# Patient Record
Sex: Female | Born: 1965
Health system: Southern US, Community
[De-identification: ages and names within clinical notes are randomized; demographics above are authoritative.]

## PROBLEM LIST (undated history)

## (undated) ENCOUNTER — Emergency Department (HOSPITAL_COMMUNITY): Admission: EM | Discharge status: Absent without leave | Payer: Self-pay

## (undated) DIAGNOSIS — F418 Other specified anxiety disorders: Secondary | ICD-10-CM

## (undated) DIAGNOSIS — R112 Nausea with vomiting, unspecified: Secondary | ICD-10-CM

## (undated) DIAGNOSIS — T63001A Toxic effect of unspecified snake venom, accidental (unintentional), initial encounter: Secondary | ICD-10-CM

## (undated) DIAGNOSIS — K279 Peptic ulcer, site unspecified, unspecified as acute or chronic, without hemorrhage or perforation: Secondary | ICD-10-CM

## (undated) DIAGNOSIS — M1990 Unspecified osteoarthritis, unspecified site: Secondary | ICD-10-CM

## (undated) DIAGNOSIS — Z9889 Other specified postprocedural states: Secondary | ICD-10-CM

## (undated) DIAGNOSIS — Z9289 Personal history of other medical treatment: Secondary | ICD-10-CM

## (undated) DIAGNOSIS — M6281 Muscle weakness (generalized): Secondary | ICD-10-CM

## (undated) DIAGNOSIS — B958 Unspecified staphylococcus as the cause of diseases classified elsewhere: Secondary | ICD-10-CM

## (undated) DIAGNOSIS — E079 Disorder of thyroid, unspecified: Secondary | ICD-10-CM

## (undated) DIAGNOSIS — M869 Osteomyelitis, unspecified: Secondary | ICD-10-CM

## (undated) DIAGNOSIS — W5501XA Bitten by cat, initial encounter: Secondary | ICD-10-CM

## (undated) DIAGNOSIS — L109 Pemphigus, unspecified: Secondary | ICD-10-CM

## (undated) DIAGNOSIS — K449 Diaphragmatic hernia without obstruction or gangrene: Secondary | ICD-10-CM

## (undated) HISTORY — PX: APPENDECTOMY: SHX54

## (undated) HISTORY — PX: TONSILLECTOMY: SUR1361

## (undated) HISTORY — PX: ELBOW SURGERY: SHX618

## (undated) HISTORY — DX: Unspecified osteoarthritis, unspecified site: M19.90

---
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
## 1988-09-12 HISTORY — PX: BREAST SURGERY: SHX581

## 1996-09-12 HISTORY — PX: LAPAROSCOPIC CHOLECYSTECTOMY: SUR755

## 2001-09-12 HISTORY — PX: TOTAL ABDOMINAL HYSTERECTOMY: SHX209

## 2013-11-09 ENCOUNTER — Emergency Department (HOSPITAL_BASED_OUTPATIENT_CLINIC_OR_DEPARTMENT_OTHER)
Admission: EM | Admit: 2013-11-09 | Discharge: 2013-11-09 | Disposition: A | Discharge status: Home / Self Care | Payer: BC Managed Care – PPO | Attending: Emergency Medicine | Admitting: Emergency Medicine

## 2013-11-09 ENCOUNTER — Encounter (HOSPITAL_BASED_OUTPATIENT_CLINIC_OR_DEPARTMENT_OTHER): Payer: Self-pay | Admitting: Emergency Medicine

## 2013-11-09 DIAGNOSIS — F419 Anxiety disorder, unspecified: Secondary | ICD-10-CM

## 2013-11-09 DIAGNOSIS — L109 Pemphigus, unspecified: Secondary | ICD-10-CM

## 2013-11-09 DIAGNOSIS — F430 Acute stress reaction: Secondary | ICD-10-CM | POA: Insufficient documentation

## 2013-11-09 DIAGNOSIS — Z87828 Personal history of other (healed) physical injury and trauma: Secondary | ICD-10-CM | POA: Insufficient documentation

## 2013-11-09 DIAGNOSIS — IMO0002 Reserved for concepts with insufficient information to code with codable children: Secondary | ICD-10-CM | POA: Insufficient documentation

## 2013-11-09 DIAGNOSIS — L129 Pemphigoid, unspecified: Secondary | ICD-10-CM | POA: Insufficient documentation

## 2013-11-09 DIAGNOSIS — F410 Panic disorder [episodic paroxysmal anxiety] without agoraphobia: Secondary | ICD-10-CM | POA: Insufficient documentation

## 2013-11-09 DIAGNOSIS — R450 Nervousness: Secondary | ICD-10-CM | POA: Insufficient documentation

## 2013-11-09 HISTORY — DX: Pemphigus, unspecified: L10.9

## 2013-11-09 MED ORDER — HYDROCODONE-ACETAMINOPHEN 5-325 MG PO TABS: 2.0000 | ORAL_TABLET | ORAL | Status: DC | PRN

## 2013-11-09 MED ORDER — ALPRAZOLAM 0.5 MG PO TABS: 0.5000 mg | ORAL_TABLET | Freq: Every evening | ORAL | Status: DC | PRN

## 2013-11-09 MED ORDER — PREDNISONE 20 MG PO TABS: ORAL_TABLET | ORAL | Status: DC

## 2013-11-09 NOTE — Discharge Instructions (Signed)
Panic Attacks Panic attacks are sudden, short feelings of great fear or discomfort. You may have them for no reason when you are relaxed, when you are uneasy (anxious), or when you are sleeping.  HOME CARE  Take all your medicines as told.  Check with your doctor before starting new medicines.  Keep all doctor visits. GET HELP IF:  You are not able to take your medicines as told.  Your symptoms do not get better.  Your symptoms get worse. GET HELP RIGHT AWAY IF:  Your attacks seem different than your normal attacks.  You have thoughts about hurting yourself or others.  You take panic attack medicine and you have a side effect. MAKE SURE YOU:  Understand these instructions.  Will watch your condition.  Will get help right away if you are not doing well or get worse. Document Released: 10/01/2010 Document Revised: 06/19/2013 Document Reviewed: 04/12/2013 [facility] Patient Information 2014 [place], [place].  Rash A rash is a change in the color or texture of your skin. There are many different types of rashes. You may have other problems that accompany your rash. CAUSES   Infections.  Allergic reactions. This can include allergies to pets or foods.  Certain medicines.  Exposure to certain chemicals, soaps, or cosmetics.  Heat.  Exposure to poisonous plants.  Tumors, both cancerous and noncancerous. SYMPTOMS   Redness.  Scaly skin.  Itchy skin.  Dry or cracked skin.  Bumps.  Blisters.  Pain. DIAGNOSIS  Your caregiver may do a physical exam to determine what type of rash you have. A skin sample (biopsy) may be taken and examined under a microscope. TREATMENT  Treatment depends on the type of rash you have. Your caregiver may prescribe certain medicines. For serious conditions, you may need to see a skin doctor (dermatologist). HOME CARE INSTRUCTIONS   Avoid the substance that caused your rash.  Do not scratch your rash. This can cause  infection.  You may take cool baths to help stop itching.  Only take over-the-counter or prescription medicines as directed by your caregiver.  Keep all follow-up appointments as directed by your caregiver. SEEK IMMEDIATE MEDICAL CARE IF:  You have increasing pain, swelling, or redness.  You have a fever.  You have new or severe symptoms.  You have body aches, diarrhea, or vomiting.  Your rash is not better after 3 days. MAKE SURE YOU:  Understand these instructions.  Will watch your condition.  Will get help right away if you are not doing well or get worse. Document Released: 08/19/2002 Document Revised: 11/21/2011 Document Reviewed: 06/13/2011 [facility] Patient Information 2014 [place]ExitCare, [place]LLC.

## 2013-11-09 NOTE — ED Provider Notes (Signed)
CSN: [id number]     Arrival date & time 11/09/13  1228 History   First MD Initiated Contact with Patient 11/09/13 1234     Chief Complaint  Patient presents with  . Rash     (Consider location/radiation/quality/duration/timing/severity/associated sxs/prior Treatment) HPI Comments: The patient presents to the ER for evaluation of painful lesions. 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 This is a chronic condition, but she reports that it worsens when she is under stress. She reports that her mother is very sick and she cannot get back home to see her. She has been having increased stress causing her to be very anxious. She has been having occasional panic attacks where she gets extremely anxious, breathing fast with shortness of breath. Patient reports that she is now experiencing some lesions on her torso and head, which is unusual. Generally her lesions are only on her legs. There are no draining lesions, red, swollen lesions or any fever.  Patient is a 48 y.o. female presenting with rash.  Rash Associated symptoms: no fever     Past Medical History  Diagnosis Date  . Bullous pemphigus    Past Surgical History  Procedure Laterality Date  . Abdominal hysterectomy    . Appendectomy     No family history on file. History  Substance Use Topics  . Smoking status: Never Smoker   . Smokeless tobacco: Not on file  . Alcohol Use: No   OB History   Grav Para Term Preterm Abortions TAB SAB Ect Mult Living                 Review of Systems  Constitutional: Negative for fever.  Skin: Positive for rash.  Psychiatric/Behavioral: The patient is nervous/anxious.   All other systems reviewed and are negative.      Allergies  Morphine and related  Home Medications   Current Outpatient Rx  Name  Route  Sig  Dispense  Refill  . ALPRAZolam (XANAX) 0.5 MG tablet   Oral   Take 1 tablet (0.5 mg total) by mouth at bedtime as needed for  anxiety.   15 tablet   0   . HYDROcodone-acetaminophen (NORCO/VICODIN) 5-325 MG per tablet   Oral   Take 2 tablets by mouth every 4 (four) hours as needed for moderate pain.   20 tablet   0   . predniSONE (DELTASONE) 20 MG tablet      3 tabs po daily x 3 days, then 2 tabs x 3 days, then 1.5 tabs x 3 days, then 1 tab x 3 days, then 0.5 tabs x 3 days   27 tablet   0    BP 113/94  Pulse 110  Temp(Src) 98.6 F (37 C) (Oral)  Resp 18  SpO2 96% Physical Exam  Constitutional: She is oriented to person, place, and time. She appears well-developed and well-nourished. No distress.  HENT:  Head: Normocephalic and atraumatic.  Right Ear: Hearing normal.  Left Ear: Hearing normal.  Nose: Nose normal.  Mouth/Throat: Oropharynx is clear and moist and mucous membranes are normal.  Eyes: Conjunctivae and EOM are normal. Pupils are equal, round, and reactive to light.  Neck: Normal range of motion. Neck supple.  Cardiovascular: Regular rhythm, S1 normal and S2 normal.  Exam reveals no gallop and no friction rub.   No murmur heard. Pulmonary/Chest: Effort normal and breath sounds normal. No respiratory distress. She exhibits no tenderness.  Abdominal: Soft. Normal appearance and bowel  sounds are normal. There is no hepatosplenomegaly. There is no tenderness. There is no rebound, no guarding, no tenderness at McBurney's point and negative Murphy's sign. No hernia.  Musculoskeletal: Normal range of motion.  Neurological: She is alert and oriented to person, place, and time. She has normal strength. No cranial nerve deficit or sensory deficit. Coordination normal. GCS eye subscore is 4. GCS verbal subscore is 5. GCS motor subscore is 6.  Skin: Skin is warm, dry and intact. Rash noted. No cyanosis.  Multiple circular lesions on the lower legs with one on the lower abdomen and one on the back. Some are healing, scabbed over, some are fluid-filled blisters. Most are 1-2 cm in diameter  Psychiatric:  She has a normal mood and affect. Her speech is normal and behavior is normal. Thought content normal.    ED Course  Procedures (including critical care time) Labs Review Labs Reviewed - No data to display Imaging Review No results found.   EKG Interpretation None      MDM   Final diagnoses:  Bullous pemphigus  Anxiety    Patient with history of bullous pemphigus, having increased symptoms due to stress and anxiety. Patient's rash is identical to previous rashes. No signs of infection. Patient given rescue Xanax and also will be placed on a prednisone taper.    Gilda Creasehristopher J. Pollina, MD 11/11/13 [phone]

## 2013-11-09 NOTE — ED Notes (Signed)
Patient states that she was bitten by a brown recluse spider several years ago, as a result has a permanent condition that cause her to get blisters or bumps typically on her legs. She states that it can be stress induced and she is under a lot of stress recently with her family. But she is concerned because the bumps are spreading to other parts of her body, which is unusual.

## 2013-11-30 ENCOUNTER — Emergency Department (HOSPITAL_BASED_OUTPATIENT_CLINIC_OR_DEPARTMENT_OTHER): Payer: BC Managed Care – PPO

## 2013-11-30 ENCOUNTER — Emergency Department (HOSPITAL_BASED_OUTPATIENT_CLINIC_OR_DEPARTMENT_OTHER)
Admission: EM | Admit: 2013-11-30 | Discharge: 2013-11-30 | Disposition: A | Discharge status: Home / Self Care | Payer: BC Managed Care – PPO | Attending: Emergency Medicine | Admitting: Emergency Medicine

## 2013-11-30 ENCOUNTER — Encounter (HOSPITAL_BASED_OUTPATIENT_CLINIC_OR_DEPARTMENT_OTHER): Payer: Self-pay | Admitting: Emergency Medicine

## 2013-11-30 DIAGNOSIS — L120 Bullous pemphigoid: Secondary | ICD-10-CM

## 2013-11-30 DIAGNOSIS — L129 Pemphigoid, unspecified: Secondary | ICD-10-CM | POA: Insufficient documentation

## 2013-11-30 DIAGNOSIS — F411 Generalized anxiety disorder: Secondary | ICD-10-CM | POA: Insufficient documentation

## 2013-11-30 DIAGNOSIS — Z79899 Other long term (current) drug therapy: Secondary | ICD-10-CM | POA: Insufficient documentation

## 2013-11-30 DIAGNOSIS — R112 Nausea with vomiting, unspecified: Secondary | ICD-10-CM | POA: Insufficient documentation

## 2013-11-30 DIAGNOSIS — R0789 Other chest pain: Secondary | ICD-10-CM | POA: Insufficient documentation

## 2013-11-30 MED ORDER — CLOBETASOL PROPIONATE 0.05 % EX CREA: 1.0000 "application " | TOPICAL_CREAM | Freq: Two times a day (BID) | CUTANEOUS | Status: DC

## 2013-11-30 MED ORDER — OXYCODONE-ACETAMINOPHEN 5-325 MG PO TABS: 2.0000 | ORAL_TABLET | ORAL | Status: DC | PRN

## 2013-11-30 NOTE — ED Notes (Signed)
Pt reports chest tightness n/v SOB assocated with shingles out break

## 2013-11-30 NOTE — ED Provider Notes (Signed)
CSN: [id number]     Arrival date & time 11/30/13  1926 History  This chart was scribed for Hilario Quarryanielle S Nelli Swalley, MD by Blanchard KelchNicole Curnes, ED Scribe. The patient was seen in room MH01/MH01. Patient's care was started at 8:40 PM.    Chief Complaint  Patient presents with  . Chest Pain    chest tigntness denies pain  . Herpes Zoster      Patient is a 48 y.o. female presenting with rash. The history is provided by the patient. No language interpreter was used.  Rash Quality: blistering and redness   Onset quality:  Gradual Duration:  1 month Timing:  Constant Progression:  Worsening Chronicity:  Chronic Relieved by: Prednisone. Associated symptoms: nausea and vomiting     HPI Comments: [name] Ledet is a 48 y.o. female with a [facility] who presents to the Emergency Department complaining of constant, full body painful lesions throughout her body that first started appearing about a month ago. She states that the lesions appear and then slowly grow into sores. She reports recent anxiety, which could have triggered the new outbreak of lesions. She also reports chest tightness, nausea, vomiting and shortness of breath that she attributes to anxiety due to the pain of the lesions. She states that she has vomited in the past with outbreaks due to the pain and anxiety.She states that Prednisone tends to alleviate them. She denies any other pertinent past medical history. She is allergic to morphine. She denies smoking or alcohol use.     Past Medical History  Diagnosis Date  . Bullous pemphigus    Past Surgical History  Procedure Laterality Date  . Abdominal hysterectomy    . Appendectomy     History reviewed. No pertinent family history. History  Substance Use Topics  . Smoking status: Never Smoker   . Smokeless tobacco: Not on file  . Alcohol Use: No   OB History   Grav Para Term Preterm Abortions TAB SAB Ect Mult Living                  Review of Systems  Respiratory: Positive for chest tightness.   Gastrointestinal: Positive for nausea and vomiting.  Skin: Positive for rash and wound.  Psychiatric/Behavioral: The patient is nervous/anxious.   All other systems reviewed and are negative.      Allergies  Morphine and related  Home Medications   Current Outpatient Rx  Name  Route  Sig  Dispense  Refill  . ALPRAZolam (XANAX) 0.5 MG tablet   Oral   Take 1 tablet (0.5 mg total) by mouth at bedtime as needed for anxiety.   15 tablet   0   . HYDROcodone-acetaminophen (NORCO/VICODIN) 5-325 MG per tablet   Oral   Take 2 tablets by mouth every 4 (four) hours as needed for moderate pain.   20 tablet   0   . predniSONE (DELTASONE) 20 MG tablet      3 tabs po daily x 3 days, then 2 tabs x 3 days, then 1.5 tabs x 3 days, then 1 tab x 3 days, then 0.5 tabs x 3 days   27 tablet   0    Triage Vitals: BP 101/74  Pulse 103  Temp(Src) 98.3 F (36.8 C) (Oral)  Resp 18  Wt 165 lb (74.844 kg)  SpO2 100%  Physical Exam  Nursing note and vitals reviewed. Constitutional: She is oriented to person, place, and time. She appears well-developed and  well-nourished.  HENT:  Head: Normocephalic and atraumatic.  Eyes: Conjunctivae and EOM are normal. Pupils are equal, round, and reactive to light.  Neck: Normal range of motion. Neck supple.  Cardiovascular: Normal rate, regular rhythm and normal heart sounds.   HR 90 on exam.  Pulmonary/Chest: Effort normal and breath sounds normal. No respiratory distress. She has no wheezes. She has no rales. She exhibits no tenderness.  Abdominal: Soft. Bowel sounds are normal. There is no tenderness. There is no rebound and no guarding.  Musculoskeletal: Normal range of motion. She exhibits no edema.  Lymphadenopathy:    She has no cervical adenopathy.  Neurological: She is alert and oriented to person, place, and time.  Skin: Skin is warm and dry. Rash noted.  Blistering lesions  present on bliteral flank anywhere from 1.5-2 cm with clear serous fluids, various larger lesions appearing as a macular papular area on left leg, left upper extremity and hand and bilateral flank and back and one on right temple.   Psychiatric: She has a normal mood and affect.    ED Course  Procedures (including critical care time)  DIAGNOSTIC STUDIES: Oxygen Saturation is 100% on room air, normal by my interpretation.    COORDINATION OF CARE: 8:41 PM -Will research most recent guidelines for treatment plan and give patient referral to establish primary care in [place]. Ordered chest x-Aroura Vasudevan.  Patient verbalizes understanding and agrees with treatment plan.    Labs Review Labs Reviewed - No data to display Imaging Review Dg Chest 2 View  11/30/2013   CLINICAL DATA:  Cutaneous lesions.  EXAM: CHEST  2 VIEW  COMPARISON:  DG SHOULDER 2+V*R* dated 11/03/2013; CT CHEST-ABD-PELV W/ CM dated 11/03/2013  FINDINGS: Normal mediastinum and cardiac silhouette. Normal pulmonary vasculature. No evidence of effusion, infiltrate, or pneumothorax. No acute bony abnormality.  IMPRESSION: No acute cardiopulmonary process.   Electronically Signed   By: Genevive Bi M.D.   On: 11/30/2013 21:23     EKG Interpretation None      MDM   Final diagnoses:  None   48 y.o. Female new to this area with history of bullous pemphigoid diagnosed several years ago who has had new outbreak during past month.  She has some associated dyspnea likely secondary to pain and anxiety.  She is not currently taking any medication for the bp and has no local pmd.  utd reccomends clobetasol and skin care as first line therapy.  She is advised regarding both and given prescription for clobetasol.  Referral is made for primary care and she is advised to obtain close follow up.  Return precautions are discussed and she voices understanding.    I personally performed the services described in this documentation, which was scribed in my  presence. The recorded information has been reviewed and considered.   Hilario Quarry, MD 11/30/13 2215

## 2013-11-30 NOTE — Discharge Instructions (Signed)
Bullous Pemphigoid °Bullous pemphigoid is a rare disorder of the skin that causes large blisters. Blisters are thin sacs that contain clear fluid. This disorder is mostly seen in people over age 46. It can flare up for weeks or months. These flare-ups usually go away suddenly, and it may be months or years before another flare-up develops. The blisters appear most often in the groin, armpits, trunk, thighs, and forearms. These blisters can cause great difficulty with your daily activities because of severe itching and irritation. In most cases, this condition disappears completely within 6 years. However, a small number of people may still have flare-ups after completing treatment. °CAUSES  °Bullous pemphigoid occurs when the body's own immune system attacks the layer of tissue beneath your skin. It is unknown why this attack occurs. In some cases, medicines can trigger blisters to form. °SYMPTOMS  °The severity of flare-ups varies from person to person. In mild cases, there may be a few small blisters with slight redness or irritation. In severe cases, blisters are larger and there are many of them located in more areas of the body. Itching, redness, and irritation may be severe. The blisters may even break open to form sores (ulcers). Mouth sores and bleeding gums can develop. This makes eating difficult. A recurrent cough or pain with swallowing can also develop if the deeper part of the throat is affected. There may also be nosebleeds if the inner part of the nose is affected. °DIAGNOSIS  °Your caregiver will do a physical exam. He or she may also take a tissue sample (biopsy) from the skin. This sample is examined in a lab to look for abnormal antibodies. Blood tests may also be done. °TREATMENT  °Treatment is aimed at relieving your symptoms and preventing infection. Medicines prescribed by your caregiver may include: °· Corticosteroids. These may be given as a pill or cream applied to the  skin. °· Antibiotics. °· Vitamin B complex. °· Medicines that suppress your immune system (immunosuppressive drugs). °Severe symptoms or complications from an infection may require hospitalization. This may be necessary for:  °· Management of wounds and ulcers. °· Giving medicines through an intravenous line (IV). °· Feeding if severe blisters or ulcers are affecting the mouth. °HOME CARE INSTRUCTIONS  °· Only take medicines as directed by your caregiver. °· If your mouth or lips are affected, your diet should only include soft foods and liquids. °· If your mouth or lips are affected, avoid drinking very hot liquids. °· Do not break or drain your blisters. °· Follow your caregiver's instructions for wearing bandages (dressings) over your blisters or ulcers. °SEEK MEDICAL CARE IF:  °· Your itching or pain is not helped by medicine. °· You develop unexplained blisters on your skin. °· You develop redness, swelling, or pain that extends beyond your blisters or ulcers. °· You notice yellowish-white fluid (pus) coming from your wounds. °SEEK IMMEDIATE MEDICAL CARE IF:  °· Your pain becomes severe. °· You cannot eat or drink because of blisters, ulcers, or pain in your lips or mouth. °· You cannot care for yourself because of blisters, ulcers, or pain in your hands or the soles of your feet. °· You have a fever. °MAKE SURE YOU:  °· Understand these instructions. °· Will watch your condition. °· Will get help right away if you are not doing well or get worse. °Document Released: 06/26/2007 Document Revised: 11/21/2011 Document Reviewed: 08/09/2011 °ExitCare® Patient Information ©2014 [employer]. ° °

## 2013-12-16 ENCOUNTER — Ambulatory Visit: Payer: BC Managed Care – PPO | Admitting: Physician Assistant

## 2013-12-16 DIAGNOSIS — Z0289 Encounter for other administrative examinations: Secondary | ICD-10-CM

## 2014-01-06 ENCOUNTER — Emergency Department (HOSPITAL_BASED_OUTPATIENT_CLINIC_OR_DEPARTMENT_OTHER)
Admission: EM | Admit: 2014-01-06 | Discharge: 2014-01-06 | Disposition: A | Discharge status: Home / Self Care | Payer: BC Managed Care – PPO | Attending: Emergency Medicine | Admitting: Emergency Medicine

## 2014-01-06 ENCOUNTER — Encounter (HOSPITAL_BASED_OUTPATIENT_CLINIC_OR_DEPARTMENT_OTHER): Payer: Self-pay | Admitting: Emergency Medicine

## 2014-01-06 DIAGNOSIS — IMO0002 Reserved for concepts with insufficient information to code with codable children: Secondary | ICD-10-CM | POA: Insufficient documentation

## 2014-01-06 DIAGNOSIS — L129 Pemphigoid, unspecified: Secondary | ICD-10-CM | POA: Insufficient documentation

## 2014-01-06 DIAGNOSIS — L120 Bullous pemphigoid: Secondary | ICD-10-CM

## 2014-01-06 DIAGNOSIS — Z79899 Other long term (current) drug therapy: Secondary | ICD-10-CM | POA: Insufficient documentation

## 2014-01-06 MED ORDER — HYDROCODONE-ACETAMINOPHEN 5-325 MG PO TABS: 2.0000 | ORAL_TABLET | ORAL | Status: DC | PRN

## 2014-01-06 MED ORDER — PREDNISONE 10 MG PO TABS: ORAL_TABLET | ORAL | Status: DC

## 2014-01-06 NOTE — ED Provider Notes (Signed)
Medical screening examination/treatment/procedure(s) were performed by non-physician practitioner and as supervising physician I was immediately available for consultation/collaboration.   EKG Interpretation None        Tichina Koebel B. Bernette MayersSheldon, MD 01/06/14 1059

## 2014-01-06 NOTE — ED Provider Notes (Signed)
CSN: [id number]     Arrival date & time 01/06/14  [id number] History   First MD Initiated Contact with Patient 01/06/14 [phone]     Chief Complaint  Patient presents with  . Leg Pain     (Consider location/radiation/quality/duration/timing/severity/associated sxs/prior Treatment) Patient is a 48 y.o. female presenting with leg pain. The history is provided by the patient.  Leg Pain Location:  Leg Injury: no   Leg location:  L leg Pain details:    Quality:  Aching   Radiates to:  L leg   Severity:  No pain   Onset quality:  Gradual   Timing:  Constant Chronicity:  New Dislocation: no   Relieved by:  Nothing Ineffective treatments:  None tried Pt complains of an outbreak of bullous pemphigus.   (pt unable to see her MD for 2 week)  Pt usually takes a steroid and pain medication during exacerbations)  Pt has had for years  Past Medical History  Diagnosis Date  . Bullous pemphigus    Past Surgical History  Procedure Laterality Date  . Abdominal hysterectomy    . Appendectomy     No family history on file. History  Substance Use Topics  . Smoking status: Never Smoker   . Smokeless tobacco: Not on file  . Alcohol Use: No   OB History   Grav Para Term Preterm Abortions TAB SAB Ect Mult Living                 Review of Systems  Skin: Positive for wound.  All other systems reviewed and are negative.     Allergies  Morphine and related  Home Medications   Prior to Admission medications   Medication Sig Start Date End Date Taking? Authorizing Provider  escitalopram (LEXAPRO) 20 MG tablet Take 20 mg by mouth daily.   Yes Historical Provider, MD  gabapentin (NEURONTIN) 300 MG capsule Take 300 mg by mouth 3 (three) times daily.   Yes Historical Provider, MD  ALPRAZolam Prudy Feeler(XANAX) 0.5 MG tablet Take 1 tablet (0.5 mg total) by mouth at bedtime as needed for anxiety. 11/09/13   Gilda Creasehristopher J. Pollina, MD  clobetasol cream (TEMOVATE) 0.05 % Apply 1 application topically 2 (two)  times daily. 11/30/13   Hilario Quarryanielle S Ray, MD  HYDROcodone-acetaminophen (NORCO/VICODIN) 5-325 MG per tablet Take 2 tablets by mouth every 4 (four) hours as needed for moderate pain. 11/09/13   Gilda Creasehristopher J. Pollina, MD  oxyCODONE-acetaminophen (PERCOCET/ROXICET) 5-325 MG per tablet Take 2 tablets by mouth every 4 (four) hours as needed for severe pain. 11/30/13   Hilario Quarryanielle S Ray, MD  predniSONE (DELTASONE) 20 MG tablet 3 tabs po daily x 3 days, then 2 tabs x 3 days, then 1.5 tabs x 3 days, then 1 tab x 3 days, then 0.5 tabs x 3 days 11/09/13   Gilda Creasehristopher J. Pollina, MD   BP 128/78  Temp(Src) 99.9 F (37.7 C)  Resp 16  Ht 5\' 4"  (1.626 m)  Wt 168 lb (76.204 kg)  BMI 28.82 kg/m2  SpO2 100% Physical Exam  Nursing note and vitals reviewed. Constitutional: She is oriented to person, place, and time. She appears well-developed and well-nourished.  HENT:  Head: Normocephalic.  Eyes: EOM are normal.  Neck: Normal range of motion.  Cardiovascular: Normal rate.   Pulmonary/Chest: Effort normal.  Musculoskeletal: Normal range of motion.  Neurological: She is alert and oriented to person, place, and time.  Skin: There is erythema.  Multiple large sores left lower leg  Psychiatric: She has a normal mood and affect.    ED Course  Procedures (including critical care time) Labs Review Labs Reviewed - No data to display  Imaging Review No results found.   EKG Interpretation None      MDM   Final diagnoses:  Bullous pemphigoid    Pt advised to follow up with her MD as scheduled Rx for prednisone taper Hydrocodone    Elson AreasLeslie K Ellowyn Rieves, PA-C 01/06/14 1025

## 2014-01-06 NOTE — Discharge Instructions (Signed)
Bullous Pemphigoid Bullous pemphigoid is a rare disorder of the skin that causes large blisters. Blisters are thin sacs that contain clear fluid. This disorder is mostly seen in people over age 46. It can flare up for weeks or months. These flare-ups usually go away suddenly, and it may be months or years before another flare-up develops. The blisters appear most often in the groin, armpits, trunk, thighs, and forearms. These blisters can cause great difficulty with your daily activities because of severe itching and irritation. In most cases, this condition disappears completely within 6 years. However, a small number of people may still have flare-ups after completing treatment. CAUSES  Bullous pemphigoid occurs when the body's own immune system attacks the layer of tissue beneath your skin. It is unknown why this attack occurs. In some cases, medicines can trigger blisters to form. SYMPTOMS  The severity of flare-ups varies from person to person. In mild cases, there may be a few small blisters with slight redness or irritation. In severe cases, blisters are larger and there are many of them located in more areas of the body. Itching, redness, and irritation may be severe. The blisters may even break open to form sores (ulcers). Mouth sores and bleeding gums can develop. This makes eating difficult. A recurrent cough or pain with swallowing can also develop if the deeper part of the throat is affected. There may also be nosebleeds if the inner part of the nose is affected. DIAGNOSIS  Your caregiver will do a physical exam. He or she may also take a tissue sample (biopsy) from the skin. This sample is examined in a lab to look for abnormal antibodies. Blood tests may also be done. TREATMENT  Treatment is aimed at relieving your symptoms and preventing infection. Medicines prescribed by your caregiver may include:  Corticosteroids. These may be given as a pill or cream applied to the  skin.  Antibiotics.  Vitamin B complex.  Medicines that suppress your immune system (immunosuppressive drugs). Severe symptoms or complications from an infection may require hospitalization. This may be necessary for:   Management of wounds and ulcers.  Giving medicines through an intravenous line (IV).  Feeding if severe blisters or ulcers are affecting the mouth. HOME CARE INSTRUCTIONS   Only take medicines as directed by your caregiver.  If your mouth or lips are affected, your diet should only include soft foods and liquids.  If your mouth or lips are affected, avoid drinking very hot liquids.  Do not break or drain your blisters.  Follow your caregiver's instructions for wearing bandages (dressings) over your blisters or ulcers. SEEK MEDICAL CARE IF:   Your itching or pain is not helped by medicine.  You develop unexplained blisters on your skin.  You develop redness, swelling, or pain that extends beyond your blisters or ulcers.  You notice yellowish-white fluid (pus) coming from your wounds. SEEK IMMEDIATE MEDICAL CARE IF:   Your pain becomes severe.  You cannot eat or drink because of blisters, ulcers, or pain in your lips or mouth.  You cannot care for yourself because of blisters, ulcers, or pain in your hands or the soles of your feet.  You have a fever. MAKE SURE YOU:   Understand these instructions.  Will watch your condition.  Will get help right away if you are not doing well or get worse. Document Released: 06/26/2007 Document Revised: 11/21/2011 Document Reviewed: 08/09/2011 [facility] Patient Information 2014 [place], [place].

## 2014-01-06 NOTE — ED Notes (Signed)
Reports leg pain that started Friday.  She has a hx of same and states she gets "flare up with stress".

## 2014-01-06 NOTE — ED Notes (Signed)
Karen Sofia, PA-C at bedside 

## 2014-01-22 ENCOUNTER — Encounter (HOSPITAL_BASED_OUTPATIENT_CLINIC_OR_DEPARTMENT_OTHER): Payer: Self-pay | Admitting: Emergency Medicine

## 2014-01-22 ENCOUNTER — Emergency Department (HOSPITAL_BASED_OUTPATIENT_CLINIC_OR_DEPARTMENT_OTHER)
Admission: EM | Admit: 2014-01-22 | Discharge: 2014-01-22 | Disposition: A | Discharge status: Home / Self Care | Payer: BC Managed Care – PPO | Attending: Emergency Medicine | Admitting: Emergency Medicine

## 2014-01-22 DIAGNOSIS — L129 Pemphigoid, unspecified: Secondary | ICD-10-CM | POA: Diagnosis not present

## 2014-01-22 DIAGNOSIS — L120 Bullous pemphigoid: Secondary | ICD-10-CM

## 2014-01-22 DIAGNOSIS — IMO0002 Reserved for concepts with insufficient information to code with codable children: Secondary | ICD-10-CM | POA: Insufficient documentation

## 2014-01-22 DIAGNOSIS — R21 Rash and other nonspecific skin eruption: Secondary | ICD-10-CM | POA: Diagnosis present

## 2014-01-22 DIAGNOSIS — Z79899 Other long term (current) drug therapy: Secondary | ICD-10-CM | POA: Diagnosis not present

## 2014-01-22 DIAGNOSIS — F411 Generalized anxiety disorder: Secondary | ICD-10-CM | POA: Diagnosis not present

## 2014-01-22 MED ORDER — OXYCODONE-ACETAMINOPHEN 5-325 MG PO TABS: 2.0000 | ORAL_TABLET | ORAL | Status: DC | PRN

## 2014-01-22 MED ORDER — LORAZEPAM 1 MG PO TABS: 1.0000 mg | ORAL_TABLET | Freq: Three times a day (TID) | ORAL | Status: DC | PRN

## 2014-01-22 NOTE — ED Provider Notes (Signed)
CSN: [id number]     Arrival date & time 01/22/14  [id number] History   First MD Initiated Contact with Patient 01/22/14 1018     Chief Complaint  Patient presents with  . Rash     (Consider location/radiation/quality/duration/timing/severity/associated sxs/prior Treatment) HPI Comments: Patient is a 48 year old female with history of bullous pemphigoid. She presents with complaints of a breakout on her back. She tells me that this is extremely painful and not relieved with hydrocodone. She tells me that she gets these breakouts when she is anxious. She has had issues with her mother's health and has been anxious about this. She believes this is worsening her breakouts.  Patient is a 48 y.o. female presenting with rash. The history is provided by the patient.  Rash Location: Back. Quality: blistering   Severity:  Moderate Onset quality:  Gradual Timing:  Constant Progression:  Worsening Chronicity:  Recurrent Relieved by:  Nothing Worsened by:  Nothing tried   Past Medical History  Diagnosis Date  . Bullous pemphigus    Past Surgical History  Procedure Laterality Date  . Abdominal hysterectomy    . Appendectomy     No family history on file. History  Substance Use Topics  . Smoking status: Never Smoker   . Smokeless tobacco: Not on file  . Alcohol Use: No   OB History   Grav Para Term Preterm Abortions TAB SAB Ect Mult Living                 Review of Systems  Skin: Positive for rash.  All other systems reviewed and are negative.     Allergies  Morphine and related  Home Medications   Prior to Admission medications   Medication Sig Start Date End Date Taking? Authorizing Provider  ALPRAZolam Prudy Feeler(XANAX) 0.5 MG tablet Take 1 tablet (0.5 mg total) by mouth at bedtime as needed for anxiety. 11/09/13   Gilda Creasehristopher J. Pollina, MD  clobetasol cream (TEMOVATE) 0.05 % Apply 1 application topically 2 (two) times daily. 11/30/13   Hilario Quarryanielle S Ray, MD  escitalopram (LEXAPRO) 20  MG tablet Take 20 mg by mouth daily.    Historical Provider, MD  gabapentin (NEURONTIN) 300 MG capsule Take 300 mg by mouth 3 (three) times daily.    Historical Provider, MD  HYDROcodone-acetaminophen (NORCO/VICODIN) 5-325 MG per tablet Take 2 tablets by mouth every 4 (four) hours as needed for moderate pain. 11/09/13   Gilda Creasehristopher J. Pollina, MD  HYDROcodone-acetaminophen (NORCO/VICODIN) 5-325 MG per tablet Take 2 tablets by mouth every 4 (four) hours as needed. 01/06/14   Elson AreasLeslie K Sofia, PA-C  oxyCODONE-acetaminophen (PERCOCET/ROXICET) 5-325 MG per tablet Take 2 tablets by mouth every 4 (four) hours as needed for severe pain. 11/30/13   Hilario Quarryanielle S Ray, MD  predniSONE (DELTASONE) 10 MG tablet 6,5,4,3,2,1 taper 01/06/14   Elson AreasLeslie K Sofia, PA-C  predniSONE (DELTASONE) 20 MG tablet 3 tabs po daily x 3 days, then 2 tabs x 3 days, then 1.5 tabs x 3 days, then 1 tab x 3 days, then 0.5 tabs x 3 days 11/09/13   Gilda Creasehristopher J. Pollina, MD   BP 124/81  Pulse 97  Temp(Src) 98 F (36.7 C) (Oral)  Resp 18  Wt 168 lb (76.204 kg)  SpO2 98% Physical Exam  Nursing note and vitals reviewed. Constitutional: She is oriented to person, place, and time. She appears well-developed and well-nourished. No distress.  HENT:  Head: Normocephalic and atraumatic.  Neck: Normal range of motion. Neck supple.  Neurological: She  is alert and oriented to person, place, and time.  Skin: She is not diaphoretic.  There are several large, bullous lesions noted to the left side of the back. There is no purulent drainage and no surrounding erythema. These are tender to the touch.    ED Course  Procedures (including critical care time) Labs Review Labs Reviewed - No data to display  Imaging Review No results found.   EKG Interpretation None      MDM   Final diagnoses:  None    Patient presents here complaining of pain coming from her skin condition. This is her fourth visit here for the same in the past 2 months. Upon  reviewing her prescription medication history on the controlled substance database, it was noted that she has now had 11 prescriptions for either hydrocodone or oxycodone along with multiple others for tramadol and anxiolytic medications. I've advised her that I will prescribe a limited quantity of medications, however she is to obtain a primary care for future refills. She understands that we will not refill these medications in the emergency department any longer.    Geoffery Lyonsouglas Meagan Spease, MD 01/22/14 [phone]

## 2014-01-22 NOTE — ED Notes (Signed)
Pt reports "blisters" and a "breakout" on back.  Reports increased anxiety and insomnia that makes lesions worse.  Vicodin is ineffective.

## 2014-01-22 NOTE — ED Notes (Signed)
Pt sts she has Bullous Pemphigoid; presently has rash to left back, following dermatone pattern, and on Left lower leg. Pt sts history of same. Needs medications for tx.

## 2014-01-22 NOTE — Discharge Instructions (Signed)
Percocet and Ativan as prescribed.  You will need to obtain a primary care provider for future refills of these medications.   Bullous Pemphigoid Bullous pemphigoid is a rare disorder of the skin that causes large blisters. Blisters are thin sacs that contain clear fluid. This disorder is mostly seen in people over age 46. It can flare up for weeks or months. These flare-ups usually go away suddenly, and it may be months or years before another flare-up develops. The blisters appear most often in the groin, armpits, trunk, thighs, and forearms. These blisters can cause great difficulty with your daily activities because of severe itching and irritation. In most cases, this condition disappears completely within 6 years. However, a small number of people may still have flare-ups after completing treatment. CAUSES  Bullous pemphigoid occurs when the body's own immune system attacks the layer of tissue beneath your skin. It is unknown why this attack occurs. In some cases, medicines can trigger blisters to form. SYMPTOMS  The severity of flare-ups varies from person to person. In mild cases, there may be a few small blisters with slight redness or irritation. In severe cases, blisters are larger and there are many of them located in more areas of the body. Itching, redness, and irritation may be severe. The blisters may even break open to form sores (ulcers). Mouth sores and bleeding gums can develop. This makes eating difficult. A recurrent cough or pain with swallowing can also develop if the deeper part of the throat is affected. There may also be nosebleeds if the inner part of the nose is affected. DIAGNOSIS  Your caregiver will do a physical exam. He or she may also take a tissue sample (biopsy) from the skin. This sample is examined in a lab to look for abnormal antibodies. Blood tests may also be done. TREATMENT  Treatment is aimed at relieving your symptoms and preventing infection. Medicines  prescribed by your caregiver may include:  Corticosteroids. These may be given as a pill or cream applied to the skin.  Antibiotics.  Vitamin B complex.  Medicines that suppress your immune system (immunosuppressive drugs). Severe symptoms or complications from an infection may require hospitalization. This may be necessary for:   Management of wounds and ulcers.  Giving medicines through an intravenous line (IV).  Feeding if severe blisters or ulcers are affecting the mouth. HOME CARE INSTRUCTIONS   Only take medicines as directed by your caregiver.  If your mouth or lips are affected, your diet should only include soft foods and liquids.  If your mouth or lips are affected, avoid drinking very hot liquids.  Do not break or drain your blisters.  Follow your caregiver's instructions for wearing bandages (dressings) over your blisters or ulcers. SEEK MEDICAL CARE IF:   Your itching or pain is not helped by medicine.  You develop unexplained blisters on your skin.  You develop redness, swelling, or pain that extends beyond your blisters or ulcers.  You notice yellowish-white fluid (pus) coming from your wounds. SEEK IMMEDIATE MEDICAL CARE IF:   Your pain becomes severe.  You cannot eat or drink because of blisters, ulcers, or pain in your lips or mouth.  You cannot care for yourself because of blisters, ulcers, or pain in your hands or the soles of your feet.  You have a fever. MAKE SURE YOU:   Understand these instructions.  Will watch your condition.  Will get help right away if you are not doing well or get worse. Document  Released: 06/26/2007 Document Revised: 11/21/2011 Document Reviewed: 08/09/2011 [facility] Patient Information 2014 [place]Care, [place]LL[place].

## 2014-06-12 DIAGNOSIS — W5501XA Bitten by cat, initial encounter: Secondary | ICD-10-CM

## 2014-06-12 HISTORY — DX: Bitten by cat, initial encounter: W55.01XA

## 2014-09-12 HISTORY — PX: SKIN GRAFT: SHX250

## 2015-03-10 DIAGNOSIS — L03119 Cellulitis of unspecified part of limb: Secondary | ICD-10-CM | POA: Insufficient documentation

## 2015-03-29 ENCOUNTER — Emergency Department (INDEPENDENT_AMBULATORY_CARE_PROVIDER_SITE_OTHER)
Admission: EM | Admit: 2015-03-29 | Discharge: 2015-03-29 | Disposition: A | Discharge status: Home / Self Care | Payer: Self-pay | Source: Home / Self Care | Attending: Emergency Medicine | Admitting: Emergency Medicine

## 2015-03-29 ENCOUNTER — Encounter (HOSPITAL_COMMUNITY): Payer: Self-pay | Admitting: *Deleted

## 2015-03-29 DIAGNOSIS — S51002A Unspecified open wound of left elbow, initial encounter: Secondary | ICD-10-CM

## 2015-03-29 HISTORY — DX: Bitten by cat, initial encounter: W55.01XA

## 2015-03-29 HISTORY — DX: Osteomyelitis, unspecified: M86.9

## 2015-03-29 HISTORY — DX: Other specified anxiety disorders: F41.8

## 2015-03-29 HISTORY — DX: Unspecified staphylococcus as the cause of diseases classified elsewhere: B95.8

## 2015-03-29 MED ORDER — ALPRAZOLAM 0.5 MG PO TABS: 0.5000 mg | ORAL_TABLET | Freq: Every evening | ORAL | Status: DC | PRN

## 2015-03-29 MED ORDER — HYDROCODONE-ACETAMINOPHEN 5-325 MG PO TABS: 1.0000 | ORAL_TABLET | ORAL | Status: DC | PRN

## 2015-03-29 MED ORDER — DOXYCYCLINE HYCLATE 100 MG PO CAPS: 100.0000 mg | ORAL_CAPSULE | Freq: Two times a day (BID) | ORAL | Status: DC

## 2015-03-29 NOTE — Discharge Instructions (Signed)
The wound doesn't look too terrible. Continue the wound care you are doing. Stop the Bactrim. Start doxycycline for 10 days. I've provided a few tablets of Xanax and Norco. Please call the wound care center to see about getting an appointment. Follow-up here if things are not improving.

## 2015-03-29 NOTE — ED Provider Notes (Signed)
CSN: [id number]     Arrival date & time 03/29/15  1604 History   First MD Initiated Contact with Patient 03/29/15 1646     Chief Complaint  Patient presents with  . Wound Infection   (Consider location/radiation/quality/duration/timing/severity/associated sxs/prior Treatment) HPI  She is a 49 year old woman here for left elbow wound. She states this has been a chronic issue since 07/03/2014. She states she received a cat bite at that time and went on to develop osteomyelitis and recurrent infections. She has had multiple surgeries as well as multiple rounds of antibiotics and skin grafts. She has just moved back to the [place] area. She saw her doctor last week for increased pain and drainage from the wound and was given Bactrim. She states the Bactrim has not helped.  She continues to see drainage. She has pain in the posterior elbow. She also reports increased anxiety since moving back to [place].  Past Medical History  Diagnosis Date  . Bullous pemphigus   . Staph infection   . Cat bite   . Osteomyelitis of elbow   . Situational anxiety    Past Surgical History  Procedure Laterality Date  . Abdominal hysterectomy    . Appendectomy    . Skin graft    . 22 surgeries on left elbow     No family history on file. History  Substance Use Topics  . Smoking status: Never Smoker   . Smokeless tobacco: Not on file  . Alcohol Use: Yes     Comment: occasional   OB History    No data available     Review of Systems As in history of present illness Allergies  Morphine and related and Zofran  Home Medications   Prior to Admission medications   Medication Sig Start Date End Date Taking? Authorizing Provider  gabapentin (NEURONTIN) 300 MG capsule Take 300 mg by mouth 3 (three) times daily.   Yes Historical Provider, MD  ibuprofen (ADVIL,MOTRIN) 800 MG tablet Take 800 mg by mouth every 8 (eight) hours as needed.   Yes Historical Provider, MD  ALPRAZolam Prudy Feeler) 0.5 MG tablet  Take 1 tablet (0.5 mg total) by mouth at bedtime as needed for anxiety. 03/29/15   Charm Rings, MD  clobetasol cream (TEMOVATE) 0.05 % Apply 1 application topically 2 (two) times daily. 11/30/13   Margarita Grizzle, MD  doxycycline (VIBRAMYCIN) 100 MG capsule Take 1 capsule (100 mg total) by mouth 2 (two) times daily. 03/29/15   Charm Rings, MD  escitalopram (LEXAPRO) 20 MG tablet Take 20 mg by mouth daily.    Historical Provider, MD  HYDROcodone-acetaminophen (NORCO/VICODIN) 5-325 MG per tablet Take 1-2 tablets by mouth every 4 (four) hours as needed for moderate pain. 03/29/15   Charm Rings, MD   BP 128/83 mmHg  Pulse 106  Temp(Src) 98.7 F (37.1 C) (Oral)  Resp 18  SpO2 98% Physical Exam  Constitutional: She is oriented to person, place, and time. She appears well-developed and well-nourished. No distress.  Cardiovascular:  Mild tachycardia  Pulmonary/Chest: Effort normal.  Neurological: She is alert and oriented to person, place, and time.  Skin:  4 x 9 cm elliptical wound over left elbow. This is mostly well-healed, except for a central 3 x 1 cm area that is a superficial ulcer. There is scant purulent drainage. There is some mild erythema.  Psychiatric:  Appears anxious    ED Course  Procedures (including critical care time) Labs Review Labs Reviewed  WOUND CULTURE  Imaging Review No results found.   MDM   1. Elbow wound, left, initial encounter    We'll change Bactrim to doxycycline. I provided 10 tablet each of Xanax and Norco. Contact information for the wound care center given. Follow-up as needed.    Charm RingsErin J Ernie Kasler, MD 03/29/15 [phone]

## 2015-03-29 NOTE — ED Notes (Signed)
Reports cat bite Oct 2015 - has had 22 surgeries (had myelitis, then osteomyelitis, skin grafts, staph infections) to left elbow.  Pt recently moved to [place].  Saw her old doctor back in other state last week when her elbow was becoming painful - was placed on Bactrim - continues taking.  States left elbow is becoming more painful, draining more.  Pain is causing her "situational anxiety" to flare-up & she is unable to sleep.  Has been out of her Xanax and pain meds - doctor last week was hesitant to prescribe since pt was crossing state lines.  Sero-sang drainage noted on dressing.

## 2015-03-31 ENCOUNTER — Emergency Department (HOSPITAL_COMMUNITY)
Admission: EM | Admit: 2015-03-31 | Discharge: 2015-03-31 | Disposition: A | Discharge status: Home / Self Care | Payer: Self-pay | Source: Home / Self Care

## 2015-03-31 ENCOUNTER — Encounter (HOSPITAL_COMMUNITY): Payer: Self-pay | Admitting: *Deleted

## 2015-03-31 DIAGNOSIS — G8929 Other chronic pain: Secondary | ICD-10-CM

## 2015-03-31 DIAGNOSIS — M25522 Pain in left elbow: Secondary | ICD-10-CM

## 2015-03-31 DIAGNOSIS — Z8739 Personal history of other diseases of the musculoskeletal system and connective tissue: Secondary | ICD-10-CM

## 2015-03-31 NOTE — Discharge Instructions (Signed)
Several possible primary care providers: Johny BlamerWilliam Harris, MD  [facility] [address] Sharlot GowdaJohn Lalonde, MD on [address] [facility] [address]

## 2015-03-31 NOTE — ED Provider Notes (Addendum)
CSN: [id number]     Arrival date & time 03/31/15  1616 History   First MD Initiated Contact with Patient 03/31/15 1708     Chief Complaint  Patient presents with  . Wound Check   (Consider location/radiation/quality/duration/timing/severity/associated sxs/prior Treatment) Patient is a 49 y.o. female presenting with wound check. The history is provided by the patient.  Wound Check This is a chronic problem. The current episode started more than 1 week ago. The problem occurs constantly. The problem has not changed since onset.Nothing aggravates the symptoms.  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  She states that this has been cared for by a Engineer, petroleum in [place] until she moved back to [place] recently.  She cannot recall the name of her prior PCP in [place] that she saw 18 months ago.  She had osteomyelitis and subsequently had a skin graft.  She says her meds two days ago haven't worked and she cannot take ibuprofen or NSAID's secondary to GI distress.  She notes that "I haven't slept in days".  Past Medical History  Diagnosis Date  . Bullous pemphigus   . Staph infection   . Cat bite   . Osteomyelitis of elbow   . Situational anxiety    Past Surgical History  Procedure Laterality Date  . Abdominal hysterectomy    . Appendectomy    . Skin graft    . 22 surgeries on left elbow     History reviewed. No pertinent family history. History  Substance Use Topics  . Smoking status: Never Smoker   . Smokeless tobacco: Not on file  . Alcohol Use: Yes     Comment: occasional   OB History    No data available     Review of Systems  Allergies  Morphine and related and Zofran  Home Medications   Prior to Admission medications   Medication Sig Start Date End Date Taking? Authorizing Provider  ALPRAZolam Prudy Feeler) 0.5 MG tablet Take 1 tablet (0.5 mg total) by mouth at bedtime as needed for  anxiety. 03/29/15   Charm Rings, MD  clobetasol cream (TEMOVATE) 0.05 % Apply 1 application topically 2 (two) times daily. 11/30/13   Margarita Grizzle, MD  doxycycline (VIBRAMYCIN) 100 MG capsule Take 1 capsule (100 mg total) by mouth 2 (two) times daily. 03/29/15   Charm Rings, MD  escitalopram (LEXAPRO) 20 MG tablet Take 20 mg by mouth daily.    Historical Provider, MD  gabapentin (NEURONTIN) 300 MG capsule Take 300 mg by mouth 3 (three) times daily.    Historical Provider, MD  HYDROcodone-acetaminophen (NORCO/VICODIN) 5-325 MG per tablet Take 1-2 tablets by mouth every 4 (four) hours as needed for moderate pain. 03/29/15   Charm Rings, MD  ibuprofen (ADVIL,MOTRIN) 800 MG tablet Take 800 mg by mouth every 8 (eight) hours as needed.    Historical Provider, MD   BP 109/82 mmHg  Pulse 86  Temp(Src) 98.6 F (37 C) (Oral)  Resp 16  SpO2 100% Physical Exam  Nursing note and vitals reviewed.  Constitutional: She is oriented to person, place, and time. She appears well-developed and well-nourished. No distress.  Cardiovascular:  Mild tachycardia  Pulmonary/Chest: Effort normal.  Neurological: She is alert and oriented to person, place, and time.  Skin:  4 x 9 cm elliptical wound over left elbow. This is mostly well-healed, except for a central 3 x 1 cm  area that is a superficial ulcer. There is scant serous drainage. There is some no erythema at wound margins.  She has reasonable ROM of the elbow. Psychiatric:  Appears anxious   ED Course  Procedures (including critical care time)   MDM  Chronic open wound left elbow with no obvious cellulitis.  This is a chronic problem with somewhat vague hx and I am not sure what she expects us to do as an urgent care center.  I will have the nurse rebandage the wound and give her some options for PCP.  Elvina SidleKurt Mariela Rex, MD   Elvina SidleKurt Natashia Roseman, MD 03/31/15 1726  Elvina SidleKurt Lesieli Bresee, MD 03/31/15 [phone]

## 2015-03-31 NOTE — ED Notes (Signed)
Pt seen [facility]  sev  Days      Ago       For  Wound  Check on a  Chronic  Infected area     -  Pt   Reports pain  And  Swelling  Noted   With drainage

## 2015-04-01 LAB — WOUND CULTURE: GRAM STAIN: NONE SEEN

## 2015-04-03 NOTE — ED Notes (Signed)
Final report of wound culture positive for staph, sensitive to Rx provided on day of visit. Called and advised patient.

## 2015-04-04 ENCOUNTER — Emergency Department (INDEPENDENT_AMBULATORY_CARE_PROVIDER_SITE_OTHER)
Admission: EM | Admit: 2015-04-04 | Discharge: 2015-04-04 | Disposition: A | Discharge status: Home / Self Care | Payer: Self-pay | Source: Home / Self Care | Attending: Family Medicine | Admitting: Family Medicine

## 2015-04-04 ENCOUNTER — Encounter (HOSPITAL_COMMUNITY): Payer: Self-pay | Admitting: Emergency Medicine

## 2015-04-04 DIAGNOSIS — T86822 Skin graft (allograft) (autograft) infection: Secondary | ICD-10-CM

## 2015-04-04 NOTE — ED Notes (Signed)
Here follow up on left elbow pain States she is in a lot of pain

## 2015-04-04 NOTE — ED Provider Notes (Signed)
CSN: [id number]     Arrival date & time 04/04/15  1749 History   First MD Initiated Contact with Patient 04/04/15 1846     Chief Complaint  Patient presents with  . Follow-up   (Consider location/radiation/quality/duration/timing/severity/associated sxs/prior Treatment) HPI  L elbow wound. Chronic. Getting worse. Constant. Still on doxy. Now with foul smell. Tefla dressing changes daily. Elbow pain is chronic. Pt given xanax and norco 7 days ago at visit to UC. 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 Unable to go back to [facility] where her previous doctors were. Patient still currently looking for employment. Denies fevers, nausea, vomiting, chest pain, shortness of breath.   Past Medical History  Diagnosis Date  . Bullous pemphigus   . Staph infection   . Cat bite   . Osteomyelitis of elbow   . Situational anxiety    Past Surgical History  Procedure Laterality Date  . Abdominal hysterectomy    . Appendectomy    . Skin graft    . 22 surgeries on left elbow     History reviewed. No pertinent family history. History  Substance Use Topics  . Smoking status: Never Smoker   . Smokeless tobacco: Not on file  . Alcohol Use: Yes     Comment: occasional   OB History    No data available     Review of Systems Per HPI with all other pertinent systems negative.   Allergies  Morphine and related and Zofran  Home Medications   Prior to Admission medications   Medication Sig Start Date End Date Taking? Authorizing Provider  ALPRAZolam Prudy Feeler) 0.5 MG tablet Take 1 tablet (0.5 mg total) by mouth at bedtime as needed for anxiety. 03/29/15   Charm Rings, MD  clobetasol cream (TEMOVATE) 0.05 % Apply 1 application topically 2 (two) times daily. 11/30/13   Margarita Grizzle, MD  doxycycline (VIBRAMYCIN) 100 MG capsule Take 1 capsule (100 mg total) by mouth 2 (two) times daily. 03/29/15   Charm Rings, MD  escitalopram (LEXAPRO) 20 MG tablet  Take 20 mg by mouth daily.    Historical Provider, MD  gabapentin (NEURONTIN) 300 MG capsule Take 300 mg by mouth 3 (three) times daily.    Historical Provider, MD  HYDROcodone-acetaminophen (NORCO/VICODIN) 5-325 MG per tablet Take 1-2 tablets by mouth every 4 (four) hours as needed for moderate pain. 03/29/15   Charm Rings, MD  ibuprofen (ADVIL,MOTRIN) 800 MG tablet Take 800 mg by mouth every 8 (eight) hours as needed.    Historical Provider, MD   BP 127/87 mmHg  Pulse 115  Temp(Src) 98.4 F (36.9 C) (Oral)  Resp 16  SpO2 100% Physical Exam Physical Exam  Constitutional: oriented to person, place, and time. appears well-developed and well-nourished. No distress.  HENT:  Head: Normocephalic and atraumatic.  Eyes: EOMI. PERRL.  Neck: Normal range of motion.  Cardiovascular: RRR, no m/r/g, 2+ distal pulses,  Pulmonary/Chest: Effort normal and breath sounds normal. No respiratory distress.  Abdominal: Soft. Bowel sounds are normal. NonTTP, no distension.  Musculoskeletal: Normal range of motion. Non ttp, no effusion.  Neurological: alert and oriented to person, place, and time.  Skin: Large left elbow wound with graft in place. Majority of graft appears to be very healthy in nature with some central ulceration. After debridement underlying tissue is beefy and red and bleeds easily. Wet-to-dry dressing applied.  Psychiatric: normal mood and affect. behavior is normal. Judgment and thought content normal.  ED Course  Procedures (including critical care time) Labs Review Labs Reviewed - No data to display  Imaging Review No results found.   MDM   1. Skin graft infection    Area of the graft that is mildly infected was cleaned with Betadine scrub brush and saline extensively. Underlying very vascular beefy granulation tissue noted. A sterile wet-to-dry dressing was applied. Patient given very strict wound care instructions to include wet-to-dry dressing changes  daily. Patient to continue her doxycycline. Patient to continue daily range of motion exercises in order to keep the wound site pliable. Patient to continue to seek medical coverage with the orange card other insurance so she can establish care with a routine physician here in the area. No evidence of septic joint or progressive infection. There are no further refills of patient's narcotics or benzodiazepines here.    Ozella Rocks, MD 04/04/15 [phone]

## 2015-04-04 NOTE — Discharge Instructions (Signed)
You are skin graft wound actually appears to be fairly healthy. This was cleaned and debrided in our office. Please continue your doxycycline. Please continue wet-to-dry dressing changes every day until the wound heals over. Please consider following up with [facility].

## 2015-04-27 ENCOUNTER — Encounter: Payer: Self-pay | Admitting: Family Medicine

## 2015-04-27 ENCOUNTER — Ambulatory Visit (INDEPENDENT_AMBULATORY_CARE_PROVIDER_SITE_OTHER): Payer: Self-pay | Admitting: Family Medicine

## 2015-04-27 VITALS — BP 114/76 | HR 106 | Temp 98.2°F | Resp 14 | Ht 64.0 in | Wt 184.0 lb

## 2015-04-27 DIAGNOSIS — Z0000 Encounter for general adult medical examination without abnormal findings: Secondary | ICD-10-CM

## 2015-04-27 LAB — CBC WITH DIFFERENTIAL/PLATELET
BASOS PCT: 0 % (ref 0–1)
Basophils Absolute: 0 10*3/uL (ref 0.0–0.1)
EOS ABS: 0.1 10*3/uL (ref 0.0–0.7)
EOS PCT: 2 % (ref 0–5)
HEMATOCRIT: 38.2 % (ref 36.0–46.0)
Hemoglobin: 13 g/dL (ref 12.0–15.0)
Lymphocytes Relative: 37 % (ref 12–46)
Lymphs Abs: 2 10*3/uL (ref 0.7–4.0)
MCH: 31 pg (ref 26.0–34.0)
MCHC: 34 g/dL (ref 30.0–36.0)
MCV: 91.2 fL (ref 78.0–100.0)
MONO ABS: 0.5 10*3/uL (ref 0.1–1.0)
MPV: 11.2 fL (ref 8.6–12.4)
Monocytes Relative: 9 % (ref 3–12)
Neutro Abs: 2.8 10*3/uL (ref 1.7–7.7)
Neutrophils Relative %: 52 % (ref 43–77)
PLATELETS: 266 10*3/uL (ref 150–400)
RBC: 4.19 MIL/uL (ref 3.87–5.11)
RDW: 14.6 % (ref 11.5–15.5)
WBC: 5.4 10*3/uL (ref 4.0–10.5)

## 2015-04-27 LAB — LIPID PANEL
CHOLESTEROL: 237 mg/dL — AB (ref 125–200)
HDL: 70 mg/dL (ref >=46)
LDL CALC: 138 mg/dL — AB (ref <130)
Total CHOL/HDL Ratio: 3.4 Ratio (ref <=5.0)
Triglycerides: 144 mg/dL (ref <150)
VLDL: 29 mg/dL (ref <30)

## 2015-04-27 LAB — COMPLETE METABOLIC PANEL WITH GFR
ALBUMIN: 4.2 g/dL (ref 3.6–5.1)
ALK PHOS: 102 U/L (ref 33–115)
ALT: 36 U/L — ABNORMAL HIGH (ref 6–29)
AST: 23 U/L (ref 10–35)
BUN: 28 mg/dL — ABNORMAL HIGH (ref 7–25)
CALCIUM: 9.1 mg/dL (ref 8.6–10.2)
CO2: 23 mmol/L (ref 20–31)
CREATININE: 1 mg/dL (ref 0.50–1.10)
Chloride: 103 mmol/L (ref 98–110)
GFR, EST AFRICAN AMERICAN: 78 mL/min (ref >=60)
GFR, Est Non African American: 68 mL/min (ref >=60)
Glucose, Bld: 90 mg/dL (ref 65–99)
Potassium: 4.2 mmol/L (ref 3.5–5.3)
Sodium: 141 mmol/L (ref 135–146)
Total Bilirubin: 0.4 mg/dL (ref 0.2–1.2)
Total Protein: 7.3 g/dL (ref 6.1–8.1)

## 2015-04-27 LAB — TSH: TSH: 3.54 u[IU]/mL (ref 0.350–4.500)

## 2015-04-27 MED ORDER — TRAMADOL HCL 50 MG PO TABS: 50.0000 mg | ORAL_TABLET | Freq: Three times a day (TID) | ORAL | Status: DC | PRN

## 2015-04-27 NOTE — Progress Notes (Signed)
Patient ID: Sabrina [name], female   DOB: [birth year], 49 y.o.   MRN: [id number]   Sabrina [name], is a 49 y.o. female  [id number]  [id number]  DOB - 1966/06/23  CC:  Chief Complaint  Patient presents with  . Establish Care    open wound on left elbow        HPI: [name] is a 49 y.o. female here to establish care. She is recently here from [place]. She currently has not type of coverage for this visit. Her major is complaint is of a chronic infection in her left elbow. This started last October with a Cat bite. She has continued to be under treatment since that time without complete resolutions. She has had 22 surgical procedures and has had a wound vac in the past. She denies other chronic illnesses except for anxiety related to her elbow.  Allergies  Allergen Reactions  . Morphine And Related   . Zofran [Ondansetron Hcl]    Past Medical History  Diagnosis Date  . Bullous pemphigus   . Staph infection   . Cat bite   . Osteomyelitis of elbow   . Situational anxiety    Current Outpatient Prescriptions on File Prior to Visit  Medication Sig Dispense Refill  . ibuprofen (ADVIL,MOTRIN) 800 MG tablet Take 800 mg by mouth every 8 (eight) hours as needed.    . ALPRAZolam (XANAX) 0.5 MG tablet Take 1 tablet (0.5 mg total) by mouth at bedtime as needed for anxiety. (Patient not taking: Reported on 04/27/2015) 10 tablet 0  . clobetasol cream (TEMOVATE) 0.05 % Apply 1 application topically 2 (two) times daily. (Patient not taking: Reported on 04/27/2015) 30 g 0  . doxycycline (VIBRAMYCIN) 100 MG capsule Take 1 capsule (100 mg total) by mouth 2 (two) times daily. (Patient not taking: Reported on 04/27/2015) 20 capsule 0  . escitalopram (LEXAPRO) 20 MG tablet Take 20 mg by mouth daily.    Marland Kitchen HYDROcodone-acetaminophen (NORCO/VICODIN) 5-325 MG per tablet Take 1-2 tablets by mouth every 4 (four) hours as needed for moderate pain. (Patient not taking: Reported on 04/27/2015) 10 tablet  0   No current facility-administered medications on file prior to visit.   Family History  Problem Relation Age of Onset  . Liver disease Mother   . Dementia Mother   . Cancer Mother   . Cancer Maternal Grandmother    Social History   Social History  . Marital Status: Single    Spouse Name: N/A  . Number of Children: N/A  . Years of Education: N/A   Occupational History  . Not on file.   Social History Main Topics  . Smoking status: Never Smoker   . Smokeless tobacco: Not on file  . Alcohol Use: No     Comment: occasional  . Drug Use: No  . Sexual Activity: Not on file   Other Topics Concern  . Not on file   Social History Narrative    Review of Systems: Constitutional: Negative for fever, chills, appetite change, weight loss,  Fatigue. Skin: Negative for rashes or lesions of concern. HENT: Negative for ear pain, ear discharge.nose bleeds Eyes: Negative for pain, discharge, redness, itching and visual disturbance. Neck: Negative for pain, stiffness Respiratory: Negative for cough, shortness of breath,   Cardiovascular: Negative for chest pain, palpitations and leg swelling. Gastrointestinal: Negative for abdominal pain, nausea, vomiting, diarrhea, constipations Genitourinary: Negative for dysuria, urgency, frequency, hematuria,  Musculoskeletal: Negative for back pain, joint pain, joint  swelling, and  gait problem.Negative for weakness. Neurological: Negative for dizziness, tremors, seizures, syncope,   light-headedness, numbness and headaches.  Hematological: Negative for easy bruising or bleeding Psychiatric/Behavioral: Negative for depression, anxiety, decreased concentration, confusion   Objective:   Filed Vitals:   04/27/15 1357  BP: 114/76  Pulse: 106  Temp: 98.2 F (36.8 C)  Resp: 14    Physical Exam: Constitutional: Patient appears well-developed and well-nourished. No distress. HENT: Normocephalic, atraumatic, External right and left ear  normal. Oropharynx is clear and moist.  Eyes: Conjunctivae and EOM are normal. PERRLA, no scleral icterus. Neck: Normal ROM. Neck supple. No lymphadenopathy, No thyromegaly. CVS: RRR, S1/S2 +, no murmurs, no gallops, no rubs Pulmonary: Effort and breath sounds normal, no stridor, rhonchi, wheezes, rales.  Abdominal: Soft. Normoactive BS,, no distension, tenderness, rebound or guarding.  Musculoskeletal: Normal range of motion. Positive for infection of left elbow, which is covered. Neuro: Alert.Normal muscle tone coordination. Non-focal Skin: Skin is warm and dry. No rash noted. Not diaphoretic. No erythema. No pallor. Psychiatric: Normal mood and affect. Behavior, judgment, thought content normal.  No results found for: WBC, HGB, HCT, MCV, PLT No results found for: CREATININE, BUN, NA, K, CL, CO2  No results found for: HGBA1C Lipid Panel  No results found for: CHOL, TRIG, HDL, CHOLHDL, VLDL, LDLCALC     Assessment and plan:    Establish care -I have reviewed available from patient -Cmet with GFR -CBC -lipid panel -TSH -Will call with results if anything needs attention   -Chronic infection -Referral to [facility]  Health maintenance -provided information and order for mammogram   PRN  The patient was given clear instructions to go to ER or return to medical center if symptoms don't improve, worsen or new problems develop. The patient verbalized understanding. The patient was told to call to get lab results if they haven't heard anything in the next week.         04/27/2015, 2:24 PM

## 2015-04-27 NOTE — Patient Instructions (Signed)
We will refer to infectious disease for elbow. Follow-up in 6 months and as needed.

## 2015-05-01 ENCOUNTER — Emergency Department (HOSPITAL_COMMUNITY)
Admission: EM | Admit: 2015-05-01 | Discharge: 2015-05-01 | Disposition: A | Discharge status: Home / Self Care | Payer: Self-pay | Attending: Emergency Medicine | Admitting: Emergency Medicine

## 2015-05-01 ENCOUNTER — Emergency Department (HOSPITAL_COMMUNITY): Payer: Self-pay

## 2015-05-01 ENCOUNTER — Encounter (HOSPITAL_COMMUNITY): Payer: Self-pay | Admitting: *Deleted

## 2015-05-01 DIAGNOSIS — Z8659 Personal history of other mental and behavioral disorders: Secondary | ICD-10-CM | POA: Insufficient documentation

## 2015-05-01 DIAGNOSIS — Z8739 Personal history of other diseases of the musculoskeletal system and connective tissue: Secondary | ICD-10-CM | POA: Insufficient documentation

## 2015-05-01 DIAGNOSIS — L089 Local infection of the skin and subcutaneous tissue, unspecified: Secondary | ICD-10-CM | POA: Insufficient documentation

## 2015-05-01 DIAGNOSIS — X58XXXA Exposure to other specified factors, initial encounter: Secondary | ICD-10-CM | POA: Insufficient documentation

## 2015-05-01 DIAGNOSIS — Y999 Unspecified external cause status: Secondary | ICD-10-CM | POA: Insufficient documentation

## 2015-05-01 DIAGNOSIS — Z0000 Encounter for general adult medical examination without abnormal findings: Secondary | ICD-10-CM

## 2015-05-01 DIAGNOSIS — Y929 Unspecified place or not applicable: Secondary | ICD-10-CM | POA: Insufficient documentation

## 2015-05-01 DIAGNOSIS — Z8619 Personal history of other infectious and parasitic diseases: Secondary | ICD-10-CM | POA: Insufficient documentation

## 2015-05-01 DIAGNOSIS — B999 Unspecified infectious disease: Secondary | ICD-10-CM

## 2015-05-01 DIAGNOSIS — S51002A Unspecified open wound of left elbow, initial encounter: Secondary | ICD-10-CM | POA: Insufficient documentation

## 2015-05-01 DIAGNOSIS — Y939 Activity, unspecified: Secondary | ICD-10-CM | POA: Insufficient documentation

## 2015-05-01 DIAGNOSIS — Z79899 Other long term (current) drug therapy: Secondary | ICD-10-CM | POA: Insufficient documentation

## 2015-05-01 DIAGNOSIS — S41102A Unspecified open wound of left upper arm, initial encounter: Secondary | ICD-10-CM

## 2015-05-01 LAB — CBC WITH DIFFERENTIAL/PLATELET
BASOS PCT: 0 % (ref 0–1)
Basophils Absolute: 0 10*3/uL (ref 0.0–0.1)
Eosinophils Absolute: 0.1 10*3/uL (ref 0.0–0.7)
Eosinophils Relative: 2 % (ref 0–5)
HEMATOCRIT: 40.6 % (ref 36.0–46.0)
HEMOGLOBIN: 14 g/dL (ref 12.0–15.0)
LYMPHS PCT: 28 % (ref 12–46)
Lymphs Abs: 1.2 10*3/uL (ref 0.7–4.0)
MCH: 32 pg (ref 26.0–34.0)
MCHC: 34.5 g/dL (ref 30.0–36.0)
MCV: 92.7 fL (ref 78.0–100.0)
MONOS PCT: 11 % (ref 3–12)
Monocytes Absolute: 0.5 10*3/uL (ref 0.1–1.0)
NEUTROS ABS: 2.5 10*3/uL (ref 1.7–7.7)
NEUTROS PCT: 59 % (ref 43–77)
Platelets: 257 10*3/uL (ref 150–400)
RBC: 4.38 MIL/uL (ref 3.87–5.11)
RDW: 13.2 % (ref 11.5–15.5)
WBC: 4.2 10*3/uL (ref 4.0–10.5)

## 2015-05-01 LAB — BASIC METABOLIC PANEL
ANION GAP: 8 (ref 5–15)
BUN: 20 mg/dL (ref 6–20)
CO2: 26 mmol/L (ref 22–32)
CREATININE: 0.88 mg/dL (ref 0.44–1.00)
Calcium: 9.6 mg/dL (ref 8.9–10.3)
Chloride: 105 mmol/L (ref 101–111)
GFR calc non Af Amer: >60 mL/min (ref >60)
Glucose, Bld: 92 mg/dL (ref 65–99)
Potassium: 3.8 mmol/L (ref 3.5–5.1)
Sodium: 139 mmol/L (ref 135–145)

## 2015-05-01 MED ORDER — TRAMADOL HCL 50 MG PO TABS
100.0000 mg | ORAL_TABLET | Freq: Once | ORAL | Status: AC
  Administered 2015-05-01: 100 mg via ORAL
  Filled 2015-05-01: qty 2

## 2015-05-01 MED ORDER — IOHEXOL 300 MG/ML  SOLN
100.0000 mL | Freq: Once | INTRAMUSCULAR | Status: AC | PRN
  Administered 2015-05-01: 100 mL via INTRAVENOUS

## 2015-05-01 MED ORDER — TRAMADOL HCL 50 MG PO TABS: 50.0000 mg | ORAL_TABLET | Freq: Three times a day (TID) | ORAL | Status: DC | PRN

## 2015-05-01 MED ORDER — MORPHINE SULFATE (PF) 4 MG/ML IV SOLN
4.0000 mg | Freq: Once | INTRAVENOUS | Status: DC
  Filled 2015-05-01: qty 1

## 2015-05-01 NOTE — Progress Notes (Addendum)
Pt noted without a pcp Pt not in room when CM visited  ED Cm further note din [facility] pt was seen for OV establish appt with [name] and has a f/u appt in [facility] for 10/28/15 at 1330  [facility] updated for pcp  Pt confirms she is now being seen at [facility] by NP and she was seen by [facility] staff member while in [facility] ED 05/01/15

## 2015-05-01 NOTE — ED Provider Notes (Signed)
Angiocath insertion Performed by: Pricilla Loveless T  Consent: Verbal consent obtained. Risks and benefits: risks, benefits and alternatives were discussed Time out: Immediately prior to procedure a "time out" was called to verify the correct patient, procedure, equipment, support staff and site/side marked as required.  Preparation: Patient was prepped and draped in the usual sterile fashion.  Vein Location: right basilic  Ultrasound Guided  Gauge: 20  Normal blood return and flush without difficulty Patient tolerance: Patient tolerated the procedure well with no immediate complications.   Asked by primary team to assist with IV access. No acute complications of IV attempt on ultrasound     Pricilla Loveless, MD 05/01/15 1547

## 2015-05-01 NOTE — Discharge Instructions (Signed)

## 2015-05-01 NOTE — ED Provider Notes (Signed)
Patient taken in sign out from PA [address] r/o osteo vs abscess negative Wound care at discharge.  Arthor Captain, PA-C 05/03/15 1142  Gwyneth Sprout, MD 05/03/15 1949

## 2015-05-01 NOTE — ED Provider Notes (Signed)
CSN: [id number]     Arrival date & time 05/01/15  1038 History   First MD Initiated Contact with Patient 05/01/15 1135     Chief Complaint  Patient presents with  . Chronic Arm Infection      (Consider location/radiation/quality/duration/timing/severity/associated sxs/prior Treatment) HPI Sabrina [name] is a 49 y.o. female  with history of recurrent left elbow infections, presents with worsening drainage and pain to the left elbow. Patient states she was bit by a cat and October 7 15. This resulted in severe  infection and osteomyelitis. Patient states she has had 22 surgeries on that arm since then. She reports 2 grafts, each time resulting in an infection of the graft. She states she had a wound VAC on the wound for multiple months. Patient states that she moved to this area a few months ago from [place] where all her treatment was performed. She reports the wound never completely closed after she was admitted to the hospital 2 months ago. At that time it was incised and drained, she was on antibiotics for 2 weeks in the hospital. She states since moving to this area she has been to urgent care into the hospital. She states she finished a full course of Keflex and in the last week finished full course of doxycycline. She continues to have drainage and swelling to the graft area. She states drainage is becoming malodorous. She called the primary care doctor office where she established care just recently and they told her to come to emergency department. She denies any fever, however she states she has chills and has generalized body myalgias and fatigue. She states "I feel like I have a flu. She cleans one at home with supple water and apply this dressing daily. No other complaints.   Past Medical History  Diagnosis Date  . Bullous pemphigus   . Staph infection   . Cat bite   . Osteomyelitis of elbow   . Situational anxiety    Past Surgical History  Procedure Laterality Date  . Abdominal  hysterectomy    . Appendectomy    . Skin graft    . 22 surgeries on left elbow     Family History  Problem Relation Age of Onset  . Liver disease Mother   . Dementia Mother   . Cancer Mother   . Cancer Maternal Grandmother    Social History  Substance Use Topics  . Smoking status: Never Smoker   . Smokeless tobacco: None  . Alcohol Use: No     Comment: occasional   OB History    No data available     Review of Systems  Constitutional: Negative for fever and chills.  Respiratory: Negative for cough, chest tightness and shortness of breath.   Cardiovascular: Negative for chest pain, palpitations and leg swelling.  Genitourinary: Negative for dysuria and flank pain.  Musculoskeletal: Positive for arthralgias. Negative for myalgias, neck pain and neck stiffness.  Skin: Positive for wound. Negative for rash.  Neurological: Negative for dizziness, weakness and headaches.  All other systems reviewed and are negative.     Allergies  Morphine and related and Zofran  Home Medications   Prior to Admission medications   Medication Sig Start Date End Date Taking? Authorizing Provider  gabapentin (NEURONTIN) 400 MG capsule Take 400 mg by mouth 4 (four) times daily.   Yes Historical Provider, MD  ibuprofen (ADVIL,MOTRIN) 200 MG tablet Take 200 mg by mouth every 6 (six) hours as needed for mild pain.  Yes Historical Provider, MD  traMADol (ULTRAM) 50 MG tablet Take 1 tablet (50 mg total) by mouth every 8 (eight) hours as needed. 04/27/15  Yes [name], NP  clobetasol cream (TEMOVATE) 0.05 % Apply 1 application topically 2 (two) times daily. Patient not taking: Reported on 04/27/2015 11/30/13   Margarita Grizzle, MD  doxycycline (VIBRAMYCIN) 100 MG capsule Take 1 capsule (100 mg total) by mouth 2 (two) times daily. Patient not taking: Reported on 04/27/2015 03/29/15   Charm Rings, MD   BP 106/70 mmHg  Pulse 101  Temp(Src) 97.9 F (36.6 C) (Oral)  Resp 16  SpO2 100% Physical  Exam  Constitutional: She is oriented to person, place, and time. She appears well-developed and well-nourished. No distress.  HENT:  Head: Normocephalic.  Eyes: Conjunctivae are normal.  Neck: Neck supple.  Cardiovascular: Normal rate, regular rhythm and normal heart sounds.   Pulmonary/Chest: Effort normal and breath sounds normal. No respiratory distress. She has no wheezes. She has no rales.  Musculoskeletal: She exhibits no edema.  Skin graft to the left lateral elbow. Joint appears to be normal with full range of motion. There is diffuse small, 1 cm wounds in the middle of the graft with purulent drainage. Graft feels fluctuant.  Neurological: She is alert and oriented to person, place, and time.  Skin: Skin is warm and dry.  Psychiatric: She has a normal mood and affect. Her behavior is normal.  Nursing note and vitals reviewed.   ED Course  Procedures (including critical care time) Labs Review Labs Reviewed  WOUND CULTURE  CBC WITH DIFFERENTIAL/PLATELET  BASIC METABOLIC PANEL    Imaging Review No results found. I have personally reviewed and evaluated these images and lab results as part of my medical decision-making.   EKG Interpretation None      MDM   Final diagnoses:  None   Patient emergency department with nonhealing wounds to the prior surgical and skin graft sites. History of recurrent infections in this area. I sent for culture. Will get labs. Will get x-ray. She is nontoxic appearing, afebrile. There is no surrounding cellulitis or deep palpable abscess from the exam.  4:37 PM Patient's lab work and x-ray are negative. Patient does have staples seen on x-ray, patient states that the staples were supposed to be removed and she did not know that there were still there. Patient will need further imaging to rule out osteomyelitis versus deep abscess. I discussed this with the radiologist,  given the staples that are still in the arm will get CT with IV contrast  instead of MRI. If MRI is negative for osteomyelitis or an abscess, patient will need to be discharged home with follow-up with a specialist for further treatment.   Jaynie Crumble, PA-C 05/02/15 1118  Samuel Jester, DO 05/03/15 1428

## 2015-05-01 NOTE — ED Notes (Signed)
Pt states she has a ride home. 

## 2015-05-01 NOTE — ED Notes (Signed)
Pt reports [place] recently [place] pt was bitten by a cat on left elbow. Since then pt has had 22 surgeries on left elbow. In May 2016, pt diagnosed with staph infection in left elbow, and started on abx. Pt went 1 month ago to urgent care and prescribed doxy. Pt now has established pcp and had doctors visit on Monday 8/15, pcp working to get pt in to see Infectious Disease doctor, but that may take a while. Since Monday pt reports wound has become worse, now there is an odor and increased pain at site. Pain 7/10 at present. Denies chills or fever.

## 2015-05-01 NOTE — ED Notes (Signed)
IV attempt x 2 unsuccessful.

## 2015-05-04 LAB — WOUND CULTURE: SPECIAL REQUESTS: NORMAL

## 2015-05-05 ENCOUNTER — Telehealth: Payer: Self-pay | Admitting: Family Medicine

## 2015-05-05 NOTE — Progress Notes (Signed)
ED Antimicrobial Stewardship Positive Culture Follow Up   Sabrina [name] is an 49 y.o. female who presented to [facility] on 05/01/2015 with a chief complaint of  Chief Complaint  Patient presents with  . Chronic Arm Infection     Recent Results (from the past 720 hour(s))  Wound culture     Status: None   Collection Time: 05/01/15 12:20 PM  Result Value Ref Range Status   Specimen Description ARM  Final   Special Requests Normal  Final   Gram Stain   Final    RARE WBC PRESENT, PREDOMINANTLY MONONUCLEAR NO SQUAMOUS EPITHELIAL CELLS SEEN NO ORGANISMS SEEN Performed at [facility]    Culture   Final    MODERATE STAPHYLOCOCCUS AUREUS Note: RIFAMPIN AND GENTAMICIN SHOULD NOT BE USED AS SINGLE DRUGS FOR TREATMENT OF STAPH INFECTIONS. Performed at [facility]    Report Status 05/04/2015 FINAL  Final   Organism ID, Bacteria STAPHYLOCOCCUS AUREUS  Final      Susceptibility   Staphylococcus aureus - MIC*    CLINDAMYCIN <=0.25 SENSITIVE Sensitive     ERYTHROMYCIN <=0.25 SENSITIVE Sensitive     GENTAMICIN <=0.5 SENSITIVE Sensitive     LEVOFLOXACIN 0.25 SENSITIVE Sensitive     OXACILLIN 0.5 SENSITIVE Sensitive     RIFAMPIN <=0.5 SENSITIVE Sensitive     TRIMETH/SULFA <=10 SENSITIVE Sensitive     VANCOMYCIN 1 SENSITIVE Sensitive     TETRACYCLINE <=1 SENSITIVE Sensitive     MOXIFLOXACIN <=0.25 SENSITIVE Sensitive     * MODERATE STAPHYLOCOCCUS AUREUS    Patient discharged originally without antimicrobial agent and treatment is now indicated  New antibiotic prescription: Clindamycin , 1 tablet TID for 7 days  ED Provider: Dierdre Forth, PA-C   Belinda Fisher Kenniyah Sasaki 05/05/2015, 9:15 AM Infectious Diseases Pharmacist Phone# [phone]

## 2015-05-05 NOTE — Telephone Encounter (Signed)
Refill request for Tramadol 50mg. Please advise. Thanks!  

## 2015-05-06 ENCOUNTER — Other Ambulatory Visit: Payer: Self-pay | Admitting: Family Medicine

## 2015-05-06 DIAGNOSIS — T798XXD Other early complications of trauma, subsequent encounter: Secondary | ICD-10-CM

## 2015-05-06 NOTE — Telephone Encounter (Signed)
Looks like she got 30 from ED less than 10 days ago.

## 2015-05-06 NOTE — Telephone Encounter (Signed)
Post ED Visit - Positive Culture Follow-up: Successful Patient Follow-Up  Culture assessed and recommendations reviewed by:  Celedonio Miyamoto, Pharm.D., BCPS-AQ ID  Georgina Pillion, [address].D., BCPS  [place], [place].D., BCPS, AAHIVP  Estella Husk, Pharm.D., BCPS, AAHIVP  Tegan Magsam, Pharm.D.  Tennis Must, [place].D Casilda Carls PharmD.  Positive wound  Culture Staphylococcus aureus   Patient discharged without antimicrobial prescription and treatment is now indicated  Organism is resistant to prescribed ED discharge antimicrobial  Patient with positive blood cultures  Changes discussed with ED provider: [place]baugh PA New antibiotic prescription Clindamycin  po tid x 7 days Called to [facility] @ [phone]  Contacted patient, 05/06/15 @ 1047  Berle Mull 05/06/2015, 10:45 AM

## 2015-05-15 ENCOUNTER — Encounter (HOSPITAL_BASED_OUTPATIENT_CLINIC_OR_DEPARTMENT_OTHER): Payer: Self-pay | Attending: Internal Medicine

## 2015-05-15 DIAGNOSIS — T8131XD Disruption of external operation (surgical) wound, not elsewhere classified, subsequent encounter: Secondary | ICD-10-CM | POA: Insufficient documentation

## 2015-05-15 DIAGNOSIS — A4902 Methicillin resistant Staphylococcus aureus infection, unspecified site: Secondary | ICD-10-CM | POA: Insufficient documentation

## 2015-06-15 ENCOUNTER — Encounter (HOSPITAL_BASED_OUTPATIENT_CLINIC_OR_DEPARTMENT_OTHER): Payer: No Typology Code available for payment source | Attending: Plastic Surgery

## 2015-06-15 DIAGNOSIS — Y838 Other surgical procedures as the cause of abnormal reaction of the patient, or of later complication, without mention of misadventure at the time of the procedure: Secondary | ICD-10-CM | POA: Insufficient documentation

## 2015-06-15 DIAGNOSIS — T8131XD Disruption of external operation (surgical) wound, not elsewhere classified, subsequent encounter: Secondary | ICD-10-CM | POA: Insufficient documentation

## 2015-06-18 ENCOUNTER — Other Ambulatory Visit: Payer: Self-pay | Admitting: Internal Medicine

## 2015-06-22 ENCOUNTER — Encounter: Payer: Self-pay | Admitting: Family Medicine

## 2015-06-22 ENCOUNTER — Ambulatory Visit: Payer: Self-pay | Admitting: Family Medicine

## 2015-06-22 ENCOUNTER — Ambulatory Visit (INDEPENDENT_AMBULATORY_CARE_PROVIDER_SITE_OTHER): Payer: Self-pay | Admitting: Family Medicine

## 2015-06-22 VITALS — BP 109/72 | HR 83 | Temp 98.3°F | Resp 16 | Ht 64.0 in | Wt 190.0 lb

## 2015-06-22 DIAGNOSIS — Z0000 Encounter for general adult medical examination without abnormal findings: Secondary | ICD-10-CM

## 2015-06-22 MED ORDER — TRAMADOL HCL 50 MG PO TABS: 50.0000 mg | ORAL_TABLET | Freq: Three times a day (TID) | ORAL | Status: DC | PRN

## 2015-06-22 MED ORDER — GABAPENTIN 300 MG PO CAPS: 600.0000 mg | ORAL_CAPSULE | Freq: Three times a day (TID) | ORAL | Status: DC

## 2015-06-22 NOTE — Progress Notes (Signed)
Patient ID: Dorthey Depace, female   DOB: 07/24/1966, 49 y.o.   MRN: [id number]   Kenlynn Houde, is a 49 y.o. female  [id number]:[id number]  [id number]  DOB - 02/11/1966  CC:  Chief Complaint  Patient presents with  . Follow-up    needs refills on meds       HPI: Aymara Sassi is a 49 y.o. female here for follow-up. He major health issue at this time is a wound on her left elbow, which is being followed by [facility] and she has been referred to a hand surgeon for surgery. This started with a cat bite about a year ago. She is experiencing neuropathy and request an increase in her gabapentin, which is currently at 400 tid. She also is in need of a refill on Tramadol for pain. She denies any change in her health since her last visit. Allergies  Allergen Reactions  . Morphine And Related Itching  . Zofran [Ondansetron Hcl]     Spots around iv site   Past Medical History  Diagnosis Date  . Bullous pemphigus   . Staph infection   . Cat bite   . Osteomyelitis of elbow   . Situational anxiety    Current Outpatient Prescriptions on File Prior to Visit  Medication Sig Dispense Refill  . ibuprofen (ADVIL,MOTRIN) 200 MG tablet Take 200 mg by mouth every 6 (six) hours as needed for mild pain.    . clobetasol cream (TEMOVATE) 0.05 % Apply 1 application topically 2 (two) times daily. (Patient not taking: Reported on 04/27/2015) 30 g 0  . doxycycline (VIBRAMYCIN) 100 MG capsule Take 1 capsule (100 mg total) by mouth 2 (two) times daily. (Patient not taking: Reported on 04/27/2015) 20 capsule 0   No current facility-administered medications on file prior to visit.   Family History  Problem Relation Age of Onset  . Liver disease Mother   . Dementia Mother   . Cancer Mother   . Cancer Maternal Grandmother    Social History   Social History  . Marital Status: Single    Spouse Name: N/A  . Number of Children: N/A  . Years of Education: N/A   Occupational History  . Not on file.    Social History Main Topics  . Smoking status: Never Smoker   . Smokeless tobacco: Not on file  . Alcohol Use: No     Comment: occasional  . Drug Use: No  . Sexual Activity: Not on file   Other Topics Concern  . Not on file   Social History Narrative    Review of Systems: Constitutional: Negative  HENT: Negative  Eyes: Negative  Neck: Negative  Respiratory: Negative  Cardiovascular: Negative  Gastrointestinal: Negative  Genitourinary: Negative  Musculoskeletal: Negative Neurological: Negative  numbness  Hematological: Negative  Psychiatric/Behavioral: Negative  Skin: Positive for open wound on left elbow from cat bite.    Objective:   Filed Vitals:   06/22/15 1337  BP: 109/72  Pulse: 83  Temp: 98.3 F (36.8 C)  Resp: 16    Physical Exam: Constitutional: Patient appears well-developed and well-nourished. No distress. HENT: Normocephalic, atraumatic, External right and left ear normal. Oropharynx is clear and moist.  Eyes: Conjunctivae and EOM are normal. PERRLA, no scleral icterus. Neck: Normal ROM. Neck supple. No lymphadenopathy, No thyromegaly. CVS: RRR, S1/S2 +, no murmurs, no gallops, no rubs Pulmonary: Effort and breath sounds normal, no stridor, rhonchi, wheezes, rales.  Abdominal: Soft. Normoactive BS,, no distension, tenderness, rebound or guarding.  Musculoskeletal: Normal range of motion. No edema and no tenderness.  Neuro: Alert.Normal muscle tone coordination. Non-focal Skin: Skin is warm and dry. No rash noted. Not diaphoretic. No erythema. No pallor. There is a dressing on her left elbow. Psychiatric: Normal mood and affect. Behavior, judgment, thought content normal.  Lab Results  Component Value Date   WBC 4.2 05/01/2015   HGB 14.0 05/01/2015   HCT 40.6 05/01/2015   MCV 92.7 05/01/2015   PLT 257 05/01/2015   Lab Results  Component Value Date   CREATININE 0.88 05/01/2015   BUN 20 05/01/2015   NA 139 05/01/2015   K 3.8 05/01/2015    CL 105 05/01/2015   CO2 26 05/01/2015    No results found for: HGBA1C Lipid Panel     Component Value Date/Time   CHOL 237* 04/27/2015 1449   TRIG 144 04/27/2015 1449   HDL 70 04/27/2015 1449   CHOLHDL 3.4 04/27/2015 1449   VLDL 29 04/27/2015 1449   LDLCALC 138* 04/27/2015 1449       Assessment and plan:   1. Wound left elbow.  - traMADol (ULTRAM) 50 MG tablet; Take 1 tablet (50 mg total) by mouth every 8 (eight) hours as needed.  Dispense: 30 tablet; Refill: 0  2. Neuropathy -Am increasing her gabapentin from 400 to 600 mg tid, #180 with no   Return in about 6 months (around 12/21/2015).  The patient was given clear instructions to go to ER or return to medical center if symptoms don't improve, worsen or new problems develop. The patient verbalized understanding.      [name], MSN, FNP-BC   06/22/2015, 2:37 PM

## 2015-06-22 NOTE — Patient Instructions (Signed)
I have increased your gabapentin to 300 mg tablets 2 three times a day. Follow-up as needed

## 2015-06-29 ENCOUNTER — Encounter (HOSPITAL_BASED_OUTPATIENT_CLINIC_OR_DEPARTMENT_OTHER): Payer: Self-pay | Admitting: *Deleted

## 2015-06-30 ENCOUNTER — Other Ambulatory Visit: Payer: Self-pay | Admitting: Plastic Surgery

## 2015-06-30 DIAGNOSIS — S41102A Unspecified open wound of left upper arm, initial encounter: Secondary | ICD-10-CM

## 2015-06-30 NOTE — Anesthesia Preprocedure Evaluation (Addendum)
Anesthesia Evaluation  Patient identified by MRN, date of birth, ID band Patient awake    Reviewed: Allergy & Precautions, NPO status , Patient's Chart, lab work & pertinent test results  History of Anesthesia Complications (+) PONV and history of anesthetic complications  Airway Mallampati: II  TM Distance: >3 FB Neck ROM: Full    Dental no notable dental hx. (+) Dental Advisory Given, Edentulous Upper, Edentulous Lower   Pulmonary neg pulmonary ROS,    Pulmonary exam normal breath sounds clear to auscultation       Cardiovascular negative cardio ROS Normal cardiovascular exam Rhythm:Regular Rate:Normal     Neuro/Psych PSYCHIATRIC DISORDERS Anxiety negative neurological ROS     GI/Hepatic negative GI ROS, Neg liver ROS,   Endo/Other  obesity  Renal/GU negative Renal ROS  negative genitourinary   Musculoskeletal negative musculoskeletal ROS (+)   Abdominal   Peds negative pediatric ROS (+)  Hematology negative hematology ROS (+)   Anesthesia Other Findings   Reproductive/Obstetrics negative OB ROS                            Anesthesia Physical Anesthesia Plan  ASA: II  Anesthesia Plan: General   Post-op Pain Management:    Induction: Intravenous  Airway Management Planned: LMA  Additional Equipment:   Intra-op Plan:   Post-operative Plan: Extubation in OR  Informed Consent: I have reviewed the patients History and Physical, chart, labs and discussed the procedure including the risks, benefits and alternatives for the proposed anesthesia with the patient or authorized representative who has indicated his/her understanding and acceptance.   Dental advisory given  Plan Discussed with:   Anesthesia Plan Comments:        Anesthesia Quick Evaluation

## 2015-07-01 ENCOUNTER — Ambulatory Visit (HOSPITAL_BASED_OUTPATIENT_CLINIC_OR_DEPARTMENT_OTHER)
Admission: RE | Admit: 2015-07-01 | Discharge: 2015-07-01 | Disposition: A | Discharge status: Home / Self Care | Payer: Self-pay | Source: Ambulatory Visit | Attending: Plastic Surgery | Admitting: Plastic Surgery

## 2015-07-01 ENCOUNTER — Encounter (HOSPITAL_BASED_OUTPATIENT_CLINIC_OR_DEPARTMENT_OTHER): Payer: Self-pay | Admitting: Anesthesiology

## 2015-07-01 ENCOUNTER — Ambulatory Visit (HOSPITAL_BASED_OUTPATIENT_CLINIC_OR_DEPARTMENT_OTHER): Payer: Self-pay | Admitting: Anesthesiology

## 2015-07-01 ENCOUNTER — Encounter (HOSPITAL_BASED_OUTPATIENT_CLINIC_OR_DEPARTMENT_OTHER)
Admission: RE | Disposition: A | Discharge status: Home / Self Care | Payer: Self-pay | Source: Ambulatory Visit | Attending: Plastic Surgery

## 2015-07-01 DIAGNOSIS — Y839 Surgical procedure, unspecified as the cause of abnormal reaction of the patient, or of later complication, without mention of misadventure at the time of the procedure: Secondary | ICD-10-CM | POA: Insufficient documentation

## 2015-07-01 DIAGNOSIS — F419 Anxiety disorder, unspecified: Secondary | ICD-10-CM | POA: Insufficient documentation

## 2015-07-01 DIAGNOSIS — S41152D Open bite of left upper arm, subsequent encounter: Secondary | ICD-10-CM | POA: Insufficient documentation

## 2015-07-01 DIAGNOSIS — Z9889 Other specified postprocedural states: Secondary | ICD-10-CM | POA: Insufficient documentation

## 2015-07-01 DIAGNOSIS — Z6832 Body mass index (BMI) 32.0-32.9, adult: Secondary | ICD-10-CM | POA: Insufficient documentation

## 2015-07-01 DIAGNOSIS — W5501XA Bitten by cat, initial encounter: Secondary | ICD-10-CM

## 2015-07-01 DIAGNOSIS — W5501XD Bitten by cat, subsequent encounter: Secondary | ICD-10-CM | POA: Insufficient documentation

## 2015-07-01 DIAGNOSIS — T8189XA Other complications of procedures, not elsewhere classified, initial encounter: Secondary | ICD-10-CM | POA: Insufficient documentation

## 2015-07-01 DIAGNOSIS — E669 Obesity, unspecified: Secondary | ICD-10-CM | POA: Insufficient documentation

## 2015-07-01 DIAGNOSIS — S41102A Unspecified open wound of left upper arm, initial encounter: Secondary | ICD-10-CM

## 2015-07-01 DIAGNOSIS — Z888 Allergy status to other drugs, medicaments and biological substances status: Secondary | ICD-10-CM | POA: Insufficient documentation

## 2015-07-01 DIAGNOSIS — S51859A Open bite of unspecified forearm, initial encounter: Secondary | ICD-10-CM

## 2015-07-01 DIAGNOSIS — L98491 Non-pressure chronic ulcer of skin of other sites limited to breakdown of skin: Secondary | ICD-10-CM

## 2015-07-01 DIAGNOSIS — Z885 Allergy status to narcotic agent status: Secondary | ICD-10-CM | POA: Insufficient documentation

## 2015-07-01 HISTORY — PX: DEBRIDEMENT AND CLOSURE WOUND: SHX5614

## 2015-07-01 HISTORY — DX: Other specified postprocedural states: Z98.890

## 2015-07-01 HISTORY — DX: Other specified postprocedural states: R11.2

## 2015-07-01 SURGERY — DEBRIDEMENT, WOUND, WITH CLOSURE
Anesthesia: General | Site: Elbow | Laterality: Left

## 2015-07-01 MED ORDER — LIDOCAINE HCL (CARDIAC) 20 MG/ML IV SOLN
INTRAVENOUS | Status: DC | PRN
  Administered 2015-07-01: 50 mg via INTRAVENOUS

## 2015-07-01 MED ORDER — SCOPOLAMINE 1 MG/3DAYS TD PT72: 1.0000 | MEDICATED_PATCH | Freq: Once | TRANSDERMAL | Status: DC | PRN

## 2015-07-01 MED ORDER — PROMETHAZINE HCL 25 MG/ML IJ SOLN
INTRAMUSCULAR | Status: AC
  Filled 2015-07-01: qty 1

## 2015-07-01 MED ORDER — GLYCOPYRROLATE 0.2 MG/ML IJ SOLN: 0.2000 mg | Freq: Once | INTRAMUSCULAR | Status: DC | PRN

## 2015-07-01 MED ORDER — PROMETHAZINE HCL 25 MG/ML IJ SOLN
6.2500 mg | INTRAMUSCULAR | Status: DC | PRN
Start: 2015-07-01 — End: 2015-07-01
  Administered 2015-07-01: 12.5 mg via INTRAVENOUS

## 2015-07-01 MED ORDER — FENTANYL CITRATE (PF) 100 MCG/2ML IJ SOLN
25.0000 ug | INTRAMUSCULAR | Status: DC | PRN
  Administered 2015-07-01 (×2): 50 ug via INTRAVENOUS

## 2015-07-01 MED ORDER — SODIUM CHLORIDE 0.9 % IJ SOLN
INTRAMUSCULAR | Status: AC
  Filled 2015-07-01: qty 10

## 2015-07-01 MED ORDER — FENTANYL CITRATE (PF) 100 MCG/2ML IJ SOLN
INTRAMUSCULAR | Status: AC
  Filled 2015-07-01: qty 2

## 2015-07-01 MED ORDER — DEXAMETHASONE SODIUM PHOSPHATE 4 MG/ML IJ SOLN
INTRAMUSCULAR | Status: DC | PRN
  Administered 2015-07-01: 10 mg via INTRAVENOUS

## 2015-07-01 MED ORDER — PROPOFOL 10 MG/ML IV BOLUS
INTRAVENOUS | Status: AC
  Filled 2015-07-01: qty 20

## 2015-07-01 MED ORDER — PHENYLEPHRINE HCL 10 MG/ML IJ SOLN
INTRAMUSCULAR | Status: DC | PRN
  Administered 2015-07-01 (×3): 80 ug via INTRAVENOUS
  Administered 2015-07-01: 40 ug via INTRAVENOUS

## 2015-07-01 MED ORDER — BUPIVACAINE-EPINEPHRINE (PF) 0.25% -1:200000 IJ SOLN
INTRAMUSCULAR | Status: AC
  Filled 2015-07-01: qty 60

## 2015-07-01 MED ORDER — MIDAZOLAM HCL 2 MG/2ML IJ SOLN
1.0000 mg | INTRAMUSCULAR | Status: DC | PRN
  Administered 2015-07-01: 2 mg via INTRAVENOUS

## 2015-07-01 MED ORDER — PROPOFOL 10 MG/ML IV BOLUS
INTRAVENOUS | Status: DC | PRN
  Administered 2015-07-01: 200 mg via INTRAVENOUS

## 2015-07-01 MED ORDER — CEFAZOLIN SODIUM-DEXTROSE 2-3 GM-% IV SOLR
2.0000 g | INTRAVENOUS | Status: AC
  Administered 2015-07-01: 2 g via INTRAVENOUS

## 2015-07-01 MED ORDER — BACITRACIN ZINC 500 UNIT/GM EX OINT
TOPICAL_OINTMENT | CUTANEOUS | Status: DC | PRN
  Administered 2015-07-01: 1 via TOPICAL

## 2015-07-01 MED ORDER — LIDOCAINE HCL (CARDIAC) 20 MG/ML IV SOLN
INTRAVENOUS | Status: AC
  Filled 2015-07-01: qty 5

## 2015-07-01 MED ORDER — ONDANSETRON HCL 4 MG/2ML IJ SOLN
INTRAMUSCULAR | Status: AC
  Filled 2015-07-01: qty 2

## 2015-07-01 MED ORDER — FENTANYL CITRATE (PF) 100 MCG/2ML IJ SOLN
50.0000 ug | INTRAMUSCULAR | Status: DC | PRN
  Administered 2015-07-01 (×2): 50 ug via INTRAVENOUS

## 2015-07-01 MED ORDER — MIDAZOLAM HCL 2 MG/2ML IJ SOLN
INTRAMUSCULAR | Status: AC
  Filled 2015-07-01: qty 4

## 2015-07-01 MED ORDER — PHENYLEPHRINE 40 MCG/ML (10ML) SYRINGE FOR IV PUSH (FOR BLOOD PRESSURE SUPPORT)
PREFILLED_SYRINGE | INTRAVENOUS | Status: AC
  Filled 2015-07-01: qty 10

## 2015-07-01 MED ORDER — SODIUM CHLORIDE 0.9 % IR SOLN
Status: DC | PRN
  Administered 2015-07-01: 200 mL

## 2015-07-01 MED ORDER — HYDROCODONE-ACETAMINOPHEN 5-325 MG PO TABS: 1.0000 | ORAL_TABLET | Freq: Four times a day (QID) | ORAL | Status: DC | PRN

## 2015-07-01 MED ORDER — BUPIVACAINE-EPINEPHRINE 0.25% -1:200000 IJ SOLN
INTRAMUSCULAR | Status: DC | PRN
  Administered 2015-07-01: 20 mL

## 2015-07-01 MED ORDER — BACITRACIN-NEOMYCIN-POLYMYXIN 400-5-5000 EX OINT
TOPICAL_OINTMENT | CUTANEOUS | Status: AC
  Filled 2015-07-01: qty 1

## 2015-07-01 MED ORDER — LACTATED RINGERS IV SOLN
INTRAVENOUS | Status: DC
  Administered 2015-07-01: 09:00:00 via INTRAVENOUS

## 2015-07-01 MED ORDER — EPHEDRINE SULFATE 50 MG/ML IJ SOLN
INTRAMUSCULAR | Status: DC | PRN
  Administered 2015-07-01 (×2): 10 mg via INTRAVENOUS

## 2015-07-01 MED ORDER — LIDOCAINE-EPINEPHRINE 1 %-1:100000 IJ SOLN
INTRAMUSCULAR | Status: AC
  Filled 2015-07-01: qty 2

## 2015-07-01 MED ORDER — CEPHALEXIN 500 MG PO CAPS: 500.0000 mg | ORAL_CAPSULE | Freq: Four times a day (QID) | ORAL | Status: DC

## 2015-07-01 SURGICAL SUPPLY — 54 items
BLADE CLIPPER SURG (BLADE) IMPLANT
BLADE HEX COATED 2.75 (ELECTRODE) ×2 IMPLANT
BLADE SURG 10 STRL SS (BLADE) IMPLANT
BLADE SURG 15 STRL LF DISP TIS (BLADE) ×3 IMPLANT
BLADE SURG 15 STRL SS (BLADE) ×3
BNDG GAUZE ELAST 4 BULKY (GAUZE/BANDAGES/DRESSINGS) ×2 IMPLANT
COVER BACK TABLE 60X90IN (DRAPES) ×2 IMPLANT
COVER MAYO STAND STRL (DRAPES) ×2 IMPLANT
DECANTER SPIKE VIAL GLASS SM (MISCELLANEOUS) ×2 IMPLANT
DRAIN CHANNEL 10F 3/8 F FF (DRAIN) ×2 IMPLANT
DRAIN CHANNEL 19F RND (DRAIN) IMPLANT
DRAPE INCISE IOBAN 66X45 STRL (DRAPES) IMPLANT
DRAPE LAPAROTOMY 100X72 PEDS (DRAPES) IMPLANT
DRAPE ORTHO SPLIT 77X108 STRL (DRAPES) ×1
DRAPE SURG 17X23 STRL (DRAPES) IMPLANT
DRAPE SURG ORHT 6 SPLT 77X108 (DRAPES) ×1 IMPLANT
DRAPE U-SHAPE 76X120 STRL (DRAPES) IMPLANT
DRSG ADAPTIC 3X8 NADH LF (GAUZE/BANDAGES/DRESSINGS) IMPLANT
DRSG EMULSION OIL 3X3 NADH (GAUZE/BANDAGES/DRESSINGS) IMPLANT
DRSG PAD ABDOMINAL 8X10 ST (GAUZE/BANDAGES/DRESSINGS) ×2 IMPLANT
DRSG TEGADERM 2-3/8X2-3/4 SM (GAUZE/BANDAGES/DRESSINGS) ×2 IMPLANT
DRSG TEGADERM 4X10 (GAUZE/BANDAGES/DRESSINGS) IMPLANT
DRSG TELFA 3X8 NADH (GAUZE/BANDAGES/DRESSINGS) IMPLANT
ELECT REM PT RETURN 9FT ADLT (ELECTROSURGICAL) ×2
ELECTRODE REM PT RTRN 9FT ADLT (ELECTROSURGICAL) ×1 IMPLANT
EVACUATOR SILICONE 100CC (DRAIN) ×2 IMPLANT
GAUZE SPONGE 4X4 12PLY STRL (GAUZE/BANDAGES/DRESSINGS) ×2 IMPLANT
GAUZE XEROFORM 1X8 LF (GAUZE/BANDAGES/DRESSINGS) ×2 IMPLANT
GAUZE XEROFORM 5X9 LF (GAUZE/BANDAGES/DRESSINGS) IMPLANT
GLOVE BIO SURGEON STRL SZ 6.5 (GLOVE) ×6 IMPLANT
GOWN STRL REUS W/ TWL LRG LVL3 (GOWN DISPOSABLE) ×3 IMPLANT
GOWN STRL REUS W/TWL LRG LVL3 (GOWN DISPOSABLE) ×3
NEEDLE HYPO 25X1 1.5 SAFETY (NEEDLE) ×2 IMPLANT
NS IRRIG 1000ML POUR BTL (IV SOLUTION) ×2 IMPLANT
PACK BASIN DAY SURGERY FS (CUSTOM PROCEDURE TRAY) ×2 IMPLANT
PENCIL BUTTON HOLSTER BLD 10FT (ELECTRODE) ×2 IMPLANT
SHEET MEDIUM DRAPE 40X70 STRL (DRAPES) ×2 IMPLANT
SLEEVE SCD COMPRESS KNEE MED (MISCELLANEOUS) ×2 IMPLANT
SPONGE LAP 18X18 X RAY DECT (DISPOSABLE) ×4 IMPLANT
STAPLER VISISTAT 35W (STAPLE) IMPLANT
SUT MNCRL AB 3-0 PS2 18 (SUTURE) ×4 IMPLANT
SUT MNCRL AB 4-0 PS2 18 (SUTURE) ×2 IMPLANT
SUT MON AB 5-0 PS2 18 (SUTURE) ×2 IMPLANT
SUT SILK 3 0 PS 1 (SUTURE) IMPLANT
SUT SILK 4 0 PS 2 (SUTURE) ×4 IMPLANT
SWAB COLLECTION DEVICE MRSA (MISCELLANEOUS) IMPLANT
SYR BULB IRRIGATION 50ML (SYRINGE) ×2 IMPLANT
SYR CONTROL 10ML LL (SYRINGE) ×2 IMPLANT
TOWEL OR 17X24 6PK STRL BLUE (TOWEL DISPOSABLE) ×4 IMPLANT
TRAY DSU PREP LF (CUSTOM PROCEDURE TRAY) ×2 IMPLANT
TUBE ANAEROBIC SPECIMEN COL (MISCELLANEOUS) IMPLANT
TUBE CONNECTING 20X1/4 (TUBING) ×2 IMPLANT
UNDERPAD 30X30 (UNDERPADS AND DIAPERS) ×2 IMPLANT
YANKAUER SUCT BULB TIP NO VENT (SUCTIONS) ×2 IMPLANT

## 2015-07-01 NOTE — Op Note (Signed)
Operative Note   DATE OF OPERATION: 07/01/2015  [facility]  SURGICAL DIVISION: Plastic Surgery  PREOPERATIVE DIAGNOSES:  Left arm wound 6 x 18 cm  POSTOPERATIVE DIAGNOSES:  same  PROCEDURE:  Excision of left arm wound with primary layered closure  6 x 18 cm  SURGEON: Tribune CompanyClaire Sanger, DO  ASSISTANT: Shawn Rayburn, PA  ANESTHESIA:  General.   COMPLICATIONS: None.   INDICATIONS FOR PROCEDURE:  The patient, Sabrina [name] is a 49 y.o. female born on 09/05/1966, is here for treatment of a left arm wound that occurred over a year ago when she was bitten by a cat.  She has undergone multiple surgeries in [place] for this injury.  She had a split thickness skin graft placed in [place] and it continually drains and breaks down. MRN: [id number]  CONSENT:  Informed consent was obtained directly from the patient. Risks, benefits and alternatives were fully discussed. Specific risks including but not limited to bleeding, infection, hematoma, seroma, scarring, pain, infection, contracture, asymmetry, wound healing problems, and need for further surgery were all discussed. The patient did have an ample opportunity to have questions answered to satisfaction.   DESCRIPTION OF PROCEDURE:  The patient was taken to the operating room. SCDs were placed and IV antibiotics were given. The patient's operative site was prepped and draped in a sterile fashion. A time out was performed and all information was confirmed to be correct.  General anesthesia was administered.  The area was injected with local for intraoperative hemostasis and postoperative pain control.  The dorsal incision was opened first with a #15 blade.  The skin graft was excised and the palmar aspect opened and the entire graft excised 6 x 18 cm.  The area was washed with antibiotic solution prior to the start and then after the excision.  Hemostasis was achieved with electrocautery.  There was a staple removed  from the deep layer.  There was noted to have muscle and fascia destruction likely from the injury. There was 2 cm of releasing done dorsal and palmar for a tension free closure.  A drain was placed and a layered closure with fascia closing with 3-0 Monocryl.  The subcutaneous layer closed with 4-0 and the skin with 5-0 Monocryl.  The drain was secured with 4-0 Silk. Triple antibiotic, xeroform and gauze was applied.  Kerlex and an ACE wrap was placed.  The patient tolerated the procedure well.  There were no complications. The patient was allowed to wake from anesthesia, extubated and taken to the recovery room in satisfactory condition.

## 2015-07-01 NOTE — Anesthesia Procedure Notes (Signed)
Procedure Name: LMA Insertion Date/Time: 07/01/2015 9:21 AM Performed by: Caren MacadamARTER, Latonya Knight W Pre-anesthesia Checklist: Patient identified, Emergency Drugs available, Suction available and Patient being monitored Patient Re-evaluated:Patient Re-evaluated prior to inductionOxygen Delivery Method: Circle System Utilized Preoxygenation: Pre-oxygenation with 100% oxygen Intubation Type: IV induction Ventilation: Mask ventilation without difficulty LMA: LMA inserted LMA Size: 4.0 Number of attempts: 1 Airway Equipment and Method: Bite block Placement Confirmation: positive ETCO2 and breath sounds checked- equal and bilateral Tube secured with: Tape Dental Injury: Teeth and Oropharynx as per pre-operative assessment

## 2015-07-01 NOTE — Transfer of Care (Signed)
Immediate Anesthesia Transfer of Care Note  Patient: Sabrina [name]  Procedure(s) Performed: Procedure(s): LEFT ELBOW EXCISION OF WOUND WITH PRIMARY CLOSURE 2X5 CM  (Left)  Patient Location: PACU  Anesthesia Type:General  Level of Consciousness: awake and alert   Airway & Oxygen Therapy: Patient Spontanous Breathing and Patient connected to face mask oxygen  Post-op Assessment: Report given to RN and Post -op Vital signs reviewed and stable  Post vital signs: Reviewed and stable  Last Vitals:  Filed Vitals:   07/01/15 1030  BP:   Pulse: 113  Temp:   Resp: 13    Complications: No apparent anesthesia complications

## 2015-07-01 NOTE — Brief Op Note (Signed)
07/01/2015  10:25 AM  PATIENT:  Sabrina [name]  49 y.o. female  PRE-OPERATIVE DIAGNOSIS:  UNSPECIFIED OPEN WOUND LEFT ELBOW   POST-OPERATIVE DIAGNOSIS:  UNSPECIFIED OPEN WOUND LEFT ELBOW   PROCEDURE:  Procedure(s): LEFT ELBOW EXCISION OF WOUND WITH PRIMARY CLOSURE 2X5 CM  (Left)  SURGEON:  Surgeon(s) and Role:    * Sosha Shepherd S Hiroyuki Ozanich, DO - Primary  PHYSICIAN ASSISTANT: Shawn Rayburn, PA  ASSISTANTS: none   ANESTHESIA:   general  EBL:  Total I/O In: 500 [I.V.:500] Out: -   BLOOD ADMINISTERED:none  DRAINS: (1) Jackson-Pratt drain(s) with closed bulb suction in the left arm pocket   LOCAL MEDICATIONS USED:  MARCAINE     SPECIMEN:  No Specimen  DISPOSITION OF SPECIMEN:  N/A  COUNTS:  YES  TOURNIQUET:  * No tourniquets in log *  DICTATION: .Dragon Dictation  PLAN OF CARE: Discharge to home after PACU  PATIENT DISPOSITION:  PACU - hemodynamically stable.   Delay start of Pharmacological VTE agent (>24hrs) due to surgical blood loss or risk of bleeding: no

## 2015-07-01 NOTE — Anesthesia Postprocedure Evaluation (Signed)
  Anesthesia Post-op Note  Patient: Sabrina LabradorDana Barradas  Procedure(s) Performed: Procedure(s) (LRB): LEFT ELBOW EXCISION OF WOUND WITH PRIMARY CLOSURE 2X5 CM  (Left)  Patient Location: PACU  Anesthesia Type: General  Level of Consciousness: awake and alert   Airway and Oxygen Therapy: Patient Spontanous Breathing  Post-op Pain: mild  Post-op Assessment: Post-op Vital signs reviewed, Patient's Cardiovascular Status Stable, Respiratory Function Stable, Patent Airway and No signs of Nausea or vomiting  Last Vitals:  Filed Vitals:   07/01/15 1110  BP:   Pulse: 93  Temp:   Resp: 12    Post-op Vital Signs: stable   Complications: No apparent anesthesia complications

## 2015-07-01 NOTE — H&P (Signed)
Sabrina LabradorDana [name] is an 49 y.o. female.   Chief Complaint: left arm wound HPI: The patient is a 49 yrs old wf here for treatment of her right arm wound.  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  She was operated on several times, had osteo of the site and was treated with antibiotics. She was treated with a STSG.  This occurred in [place]. The area has continued to break down and she presents for treatment.  Past Medical History  Diagnosis Date  . Bullous pemphigus   . Staph infection   . Cat bite   . 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 Situational anxiety   . PONV (postoperative nausea and vomiting)     Past Surgical History  Procedure Laterality Date  . Abdominal hysterectomy    . Appendectomy    . Skin graft    . 22 surgeries on left elbow      Family History  Problem Relation Age of Onset  . Liver disease Mother   . Dementia Mother   . Cancer Mother   . Cancer Maternal Grandmother    Social History:  reports that she has never smoked. She does not have any smokeless tobacco history on file. She reports that she does not drink alcohol or use illicit drugs.  Allergies:  Allergies  Allergen Reactions  . Morphine And Related Itching  . Zofran [Ondansetron Hcl]     Spots around iv site  . Droperidol Palpitations    Medications Prior to Admission  Medication Sig Dispense Refill  . gabapentin (NEURONTIN) 300 MG capsule Take 2 capsules (600 mg total) by mouth 3 (three) times daily. 180 capsule 3  . ibuprofen (ADVIL,MOTRIN) 200 MG tablet Take 200 mg by mouth every 6 (six) hours as needed for mild pain.    . traMADol (ULTRAM) 50 MG tablet Take 1 tablet (50 mg total) by mouth every 8 (eight) hours as needed. 30 tablet 0    No results found for this or any previous visit (from the past 48 hour(s)). No results found.  Review of Systems  Constitutional: Negative.   HENT: Negative.   Eyes: Negative.   Respiratory: Negative.     Cardiovascular: Negative.   Gastrointestinal: Negative.   Genitourinary: Negative.   Musculoskeletal: Negative.   Skin: Negative.   Neurological: Negative.   Psychiatric/Behavioral: Negative.     Blood pressure 121/81, pulse 83, temperature 98.2 F (36.8 C), temperature source Oral, resp. rate 18, height 5\' 4"  (1.626 m), weight 86.183 kg (190 lb), SpO2 100 %. Physical Exam  Constitutional: She is oriented to person, place, and time. She appears well-developed and well-nourished.  HENT:  Head: Normocephalic and atraumatic.  Eyes: Conjunctivae and EOM are normal. Pupils are equal, round, and reactive to light.  Respiratory: Effort normal.  GI: Soft.  Musculoskeletal:       Arms: Neurological: She is alert and oriented to person, place, and time.  Skin:  Left arm wound  Psychiatric: She has a normal mood and affect. Her behavior is normal. Judgment and thought content normal.     Assessment/Plan Plan for excision of wound and primary closure of left arm wound.  Peggye FormCLAIRE S DILLINGHAM 07/01/2015, 8:52 AM

## 2015-07-01 NOTE — Discharge Instructions (Addendum)
Keep dressing in place for three days.  May remove, shower and re-wrap with kerlex and Ace wrap daily starting Saturday.    Post Anesthesia Home Care Instructions  Activity: Get plenty of rest for the remainder of the day. A responsible adult should stay with you for 24 hours following the procedure.  For the next 24 hours, DO NOT: -Drive a car -Advertising copywriterperate machinery -Drink alcoholic beverages -Take any medication unless instructed by your physician -Make any legal decisions or sign important papers.  Meals: Start with liquid foods such as gelatin or soup. Progress to regular foods as tolerated. Avoid greasy, spicy, heavy foods. If nausea and/or vomiting occur, drink only clear liquids until the nausea and/or vomiting subsides. Call your physician if vomiting continues.  Special Instructions/Symptoms: Your throat may feel dry or sore from the anesthesia or the breathing tube placed in your throat during surgery. If this causes discomfort, gargle with warm salt water. The discomfort should disappear within 24 hours.  If you had a scopolamine patch placed behind your ear for the management of post- operative nausea and/or vomiting:  1. The medication in the patch is effective for 72 hours, after which it should be removed.  Wrap patch in a tissue and discard in the trash. Wash hands thoroughly with soap and water. 2. You may remove the patch earlier than 72 hours if you experience unpleasant side effects which may include dry mouth, dizziness or visual disturbances. 3. Avoid touching the patch. Wash your hands with soap and water after contact with the patch.

## 2015-07-02 ENCOUNTER — Encounter (HOSPITAL_BASED_OUTPATIENT_CLINIC_OR_DEPARTMENT_OTHER): Payer: Self-pay | Admitting: Plastic Surgery

## 2015-07-04 ENCOUNTER — Emergency Department (HOSPITAL_COMMUNITY): Payer: Self-pay

## 2015-07-04 ENCOUNTER — Encounter (HOSPITAL_COMMUNITY): Payer: Self-pay | Admitting: Emergency Medicine

## 2015-07-04 ENCOUNTER — Inpatient Hospital Stay (HOSPITAL_COMMUNITY)
Admission: EM | Admit: 2015-07-04 | Discharge: 2015-07-09 | DRG: 872 | Disposition: A | Discharge status: Home Health | Payer: Self-pay | Attending: Internal Medicine | Admitting: Internal Medicine

## 2015-07-04 DIAGNOSIS — L98491 Non-pressure chronic ulcer of skin of other sites limited to breakdown of skin: Secondary | ICD-10-CM | POA: Diagnosis present

## 2015-07-04 DIAGNOSIS — M869 Osteomyelitis, unspecified: Secondary | ICD-10-CM | POA: Insufficient documentation

## 2015-07-04 DIAGNOSIS — T148XXA Other injury of unspecified body region, initial encounter: Secondary | ICD-10-CM

## 2015-07-04 DIAGNOSIS — T8149XA Infection following a procedure, other surgical site, initial encounter: Secondary | ICD-10-CM | POA: Diagnosis present

## 2015-07-04 DIAGNOSIS — R000 Tachycardia, unspecified: Secondary | ICD-10-CM | POA: Diagnosis present

## 2015-07-04 DIAGNOSIS — A419 Sepsis, unspecified organism: Principal | ICD-10-CM | POA: Diagnosis present

## 2015-07-04 DIAGNOSIS — E860 Dehydration: Secondary | ICD-10-CM | POA: Diagnosis present

## 2015-07-04 DIAGNOSIS — Z79899 Other long term (current) drug therapy: Secondary | ICD-10-CM

## 2015-07-04 DIAGNOSIS — Z885 Allergy status to narcotic agent status: Secondary | ICD-10-CM

## 2015-07-04 DIAGNOSIS — Z809 Family history of malignant neoplasm, unspecified: Secondary | ICD-10-CM

## 2015-07-04 DIAGNOSIS — IMO0001 Reserved for inherently not codable concepts without codable children: Secondary | ICD-10-CM

## 2015-07-04 DIAGNOSIS — M8660 Other chronic osteomyelitis, unspecified site: Secondary | ICD-10-CM | POA: Diagnosis present

## 2015-07-04 DIAGNOSIS — L089 Local infection of the skin and subcutaneous tissue, unspecified: Secondary | ICD-10-CM

## 2015-07-04 DIAGNOSIS — Z888 Allergy status to other drugs, medicaments and biological substances status: Secondary | ICD-10-CM

## 2015-07-04 DIAGNOSIS — Y838 Other surgical procedures as the cause of abnormal reaction of the patient, or of later complication, without mention of misadventure at the time of the procedure: Secondary | ICD-10-CM | POA: Diagnosis present

## 2015-07-04 DIAGNOSIS — L7634 Postprocedural seroma of skin and subcutaneous tissue following other procedure: Secondary | ICD-10-CM | POA: Diagnosis present

## 2015-07-04 DIAGNOSIS — R197 Diarrhea, unspecified: Secondary | ICD-10-CM | POA: Diagnosis present

## 2015-07-04 DIAGNOSIS — T814XXA Infection following a procedure, initial encounter: Secondary | ICD-10-CM | POA: Diagnosis present

## 2015-07-04 DIAGNOSIS — A084 Viral intestinal infection, unspecified: Secondary | ICD-10-CM | POA: Diagnosis present

## 2015-07-04 DIAGNOSIS — Z79891 Long term (current) use of opiate analgesic: Secondary | ICD-10-CM

## 2015-07-04 DIAGNOSIS — W5501XA Bitten by cat, initial encounter: Secondary | ICD-10-CM | POA: Diagnosis present

## 2015-07-04 LAB — LIPASE, BLOOD: Lipase: 27 U/L (ref 11–51)

## 2015-07-04 LAB — CBC
HEMATOCRIT: 37.3 % (ref 36.0–46.0)
HEMOGLOBIN: 12.4 g/dL (ref 12.0–15.0)
MCH: 31 pg (ref 26.0–34.0)
MCHC: 33.2 g/dL (ref 30.0–36.0)
MCV: 93.3 fL (ref 78.0–100.0)
Platelets: 255 10*3/uL (ref 150–400)
RBC: 4 MIL/uL (ref 3.87–5.11)
RDW: 12.9 % (ref 11.5–15.5)
WBC: 8.4 10*3/uL (ref 4.0–10.5)

## 2015-07-04 LAB — COMPREHENSIVE METABOLIC PANEL
ALBUMIN: 4.1 g/dL (ref 3.5–5.0)
ALK PHOS: 97 U/L (ref 38–126)
ALT: 17 U/L (ref 14–54)
ANION GAP: 9 (ref 5–15)
AST: 21 U/L (ref 15–41)
BUN: 21 mg/dL — ABNORMAL HIGH (ref 6–20)
CALCIUM: 9.2 mg/dL (ref 8.9–10.3)
CO2: 24 mmol/L (ref 22–32)
Chloride: 106 mmol/L (ref 101–111)
Creatinine, Ser: 0.84 mg/dL (ref 0.44–1.00)
GFR calc Af Amer: >60 mL/min (ref >60)
GFR calc non Af Amer: >60 mL/min (ref >60)
GLUCOSE: 98 mg/dL (ref 65–99)
POTASSIUM: 4 mmol/L (ref 3.5–5.1)
SODIUM: 139 mmol/L (ref 135–145)
Total Bilirubin: 0.6 mg/dL (ref 0.3–1.2)
Total Protein: 7.5 g/dL (ref 6.5–8.1)

## 2015-07-04 LAB — I-STAT CG4 LACTIC ACID, ED
Lactic Acid, Venous: 0.77 mmol/L (ref 0.5–2.0)
Lactic Acid, Venous: 0.71 mmol/L (ref 0.5–2.0)

## 2015-07-04 MED ORDER — GABAPENTIN 300 MG PO CAPS
600.0000 mg | ORAL_CAPSULE | Freq: Three times a day (TID) | ORAL | Status: DC
  Administered 2015-07-05 – 2015-07-09 (×14): 600 mg via ORAL
  Filled 2015-07-04 (×17): qty 2

## 2015-07-04 MED ORDER — PIPERACILLIN-TAZOBACTAM 3.375 G IVPB: 3.3750 g | Freq: Once | INTRAVENOUS | Status: DC

## 2015-07-04 MED ORDER — SODIUM CHLORIDE 0.9 % IJ SOLN
3.0000 mL | Freq: Two times a day (BID) | INTRAMUSCULAR | Status: DC
  Administered 2015-07-04 – 2015-07-07 (×3): 3 mL via INTRAVENOUS

## 2015-07-04 MED ORDER — METOCLOPRAMIDE HCL 10 MG PO TABS: 5.0000 mg | ORAL_TABLET | Freq: Three times a day (TID) | ORAL | Status: DC | PRN

## 2015-07-04 MED ORDER — METOCLOPRAMIDE HCL 5 MG/ML IJ SOLN
10.0000 mg | Freq: Once | INTRAMUSCULAR | Status: DC
  Filled 2015-07-04: qty 2

## 2015-07-04 MED ORDER — HYDROMORPHONE HCL 1 MG/ML IJ SOLN
1.0000 mg | Freq: Once | INTRAMUSCULAR | Status: DC
  Filled 2015-07-04: qty 1

## 2015-07-04 MED ORDER — SODIUM CHLORIDE 0.9 % IV SOLN
INTRAVENOUS | Status: AC
  Administered 2015-07-04: via INTRAVENOUS

## 2015-07-04 MED ORDER — ACETAMINOPHEN 650 MG RE SUPP: 650.0000 mg | Freq: Four times a day (QID) | RECTAL | Status: DC | PRN

## 2015-07-04 MED ORDER — PIPERACILLIN-TAZOBACTAM 3.375 G IVPB
3.3750 g | Freq: Once | INTRAVENOUS | Status: DC
  Filled 2015-07-04: qty 50

## 2015-07-04 MED ORDER — METOCLOPRAMIDE HCL 5 MG/ML IJ SOLN
5.0000 mg | Freq: Four times a day (QID) | INTRAMUSCULAR | Status: DC
  Administered 2015-07-05 – 2015-07-09 (×16): 5 mg via INTRAVENOUS
  Filled 2015-07-04: qty 2
  Filled 2015-07-04 (×7): qty 1
  Filled 2015-07-04 (×2): qty 2
  Filled 2015-07-04 (×3): qty 1
  Filled 2015-07-04: qty 2
  Filled 2015-07-04 (×2): qty 1
  Filled 2015-07-04 (×3): qty 2
  Filled 2015-07-04 (×5): qty 1
  Filled 2015-07-04 (×2): qty 2

## 2015-07-04 MED ORDER — ACETAMINOPHEN 325 MG PO TABS: 650.0000 mg | ORAL_TABLET | Freq: Four times a day (QID) | ORAL | Status: DC | PRN

## 2015-07-04 MED ORDER — HYDROMORPHONE HCL 1 MG/ML IJ SOLN
0.5000 mg | INTRAMUSCULAR | Status: DC | PRN
  Administered 2015-07-05 (×3): 0.5 mg via INTRAVENOUS
  Filled 2015-07-04 (×3): qty 1

## 2015-07-04 MED ORDER — HYDROCODONE-ACETAMINOPHEN 5-325 MG PO TABS
1.0000 | ORAL_TABLET | Freq: Four times a day (QID) | ORAL | Status: DC | PRN
  Administered 2015-07-06 – 2015-07-09 (×6): 1 via ORAL
  Filled 2015-07-04 (×7): qty 1

## 2015-07-04 MED ORDER — VANCOMYCIN HCL IN DEXTROSE 1-5 GM/200ML-% IV SOLN
1000.0000 mg | Freq: Once | INTRAVENOUS | Status: DC
  Filled 2015-07-04: qty 200

## 2015-07-04 MED ORDER — METOCLOPRAMIDE HCL 5 MG/ML IJ SOLN
10.0000 mg | Freq: Once | INTRAMUSCULAR | Status: AC
  Administered 2015-07-04: 10 mg via INTRAMUSCULAR
  Filled 2015-07-04: qty 2

## 2015-07-04 MED ORDER — VANCOMYCIN HCL IN DEXTROSE 1-5 GM/200ML-% IV SOLN
1000.0000 mg | Freq: Once | INTRAVENOUS | Status: AC
  Administered 2015-07-05: 1000 mg via INTRAVENOUS
  Filled 2015-07-04: qty 200

## 2015-07-04 MED ORDER — HYDROMORPHONE HCL 1 MG/ML IJ SOLN
1.0000 mg | Freq: Once | INTRAMUSCULAR | Status: AC
  Administered 2015-07-04: 1 mg via INTRAMUSCULAR

## 2015-07-04 NOTE — ED Notes (Signed)
Pt to xray

## 2015-07-04 NOTE — ED Provider Notes (Signed)
CSN: [id number]     Arrival date & time 07/04/15  1820 History   First MD Initiated Contact with Patient 07/04/15 1935     Chief Complaint  Patient presents with  . Post-op Problem  . Tachycardia     (Consider location/radiation/quality/duration/timing/severity/associated sxs/prior Treatment) HPI Patient is post skin graft removal at left elbow with drain placement 3 days ago. She reports that the fascia temperature 101.1 and has been vomiting multiple times. She's been unable to hold down her pain medication or her antibiotics. She also complains of pain at left elbow and numbness in all of her fingers since the surgery. No other associated symptoms. No treatment prior to coming here. Past Medical History  Diagnosis Date  . Bullous pemphigus   . Staph infection   . Cat bite   . 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 Situational anxiety   . PONV (postoperative nausea and vomiting)    Past Surgical History  Procedure Laterality Date  . Abdominal hysterectomy    . Appendectomy    . Skin graft    . 22 surgeries on left elbow    . Debridement and closure wound Left 07/01/2015    Procedure: LEFT ELBOW EXCISION OF WOUND WITH PRIMARY CLOSURE 2X5 CM ;  Surgeon: Peggye Form, DO;  Location: [facility];  Service: Plastics;  Laterality: Left;   Family History  Problem Relation Age of Onset  . Liver disease Mother   . Dementia Mother   . Cancer Mother   . Cancer Maternal Grandmother    Social History  Substance Use Topics  . Smoking status: Never Smoker   . Smokeless tobacco: None  . Alcohol Use: No   OB History    No data available     Review of Systems  Gastrointestinal: Positive for nausea and vomiting.  Musculoskeletal: Positive for arthralgias.       Left elbow pain  Neurological: Positive for numbness.       Numbness in fingers of left hand  All other systems reviewed and are negative.     Allergies  Morphine and related; Zofran; and  Droperidol  Home Medications   Prior to Admission medications   Medication Sig Start Date End Date Taking? Authorizing Provider  cephALEXin (KEFLEX) 500 MG capsule Take 1 capsule (500 mg total) by mouth 4 (four) times daily. 07/01/15  Yes Shawn M Rayburn, PA-C  gabapentin (NEURONTIN) 300 MG capsule Take 2 capsules (600 mg total) by mouth 3 (three) times daily. 06/22/15  Yes [name], NP  HYDROcodone-acetaminophen (NORCO) 5-325 MG tablet Take 1 tablet by mouth every 6 (six) hours as needed for moderate pain. 07/01/15  Yes Shawn M Rayburn, PA-C  traMADol (ULTRAM) 50 MG tablet Take 1 tablet (50 mg total) by mouth every 8 (eight) hours as needed. Patient not taking: Reported on 07/04/2015 06/22/15   [name], NP   BP 115/66 mmHg  Pulse 135  Temp(Src) 98.6 F (37 C) (Oral)  Resp 18 Physical Exam  Constitutional: She is oriented to person, place, and time. She appears well-developed and well-nourished. No distress.  Alert Glasgow Coma Score 15  HENT:  Head: Normocephalic and atraumatic.  Eyes: Conjunctivae are normal. Pupils are equal, round, and reactive to light.  Neck: Neck supple. No tracheal deviation present. No thyromegaly present.  Cardiovascular: Regular rhythm.   No murmur heard. Tachycardic  Pulmonary/Chest: Effort normal and breath sounds normal.  Abdominal: Soft. Bowel sounds are normal. She exhibits no  distension. There is no tenderness.  Obese  Musculoskeletal: Normal range of motion. She exhibits no edema or tenderness.  Left upper extremity with surgical wound along the ulnar aspect of forearm originating at elbow. For the corresponding swelling and redness proximally 3 or 4 cm on either side of the wound. The wound appears clean and dry. Without drainage. There is a drain in place at the mid forearm with serosanguineous material in the drain. Radial pulse 2+ moves all fingers well. Good capillary refill to all fingers  Neurological: She is alert and  oriented to person, place, and time. Coordination normal.  Skin: Skin is warm and dry. No rash noted.  Psychiatric: She has a normal mood and affect.  Nursing note and vitals reviewed.   ED Course  Procedures (including critical care time) Labs Review Labs Reviewed  COMPREHENSIVE METABOLIC PANEL - Abnormal; Notable for the following:    BUN 21 (*)    All other components within normal limits  LIPASE, BLOOD  CBC  URINALYSIS, ROUTINE W REFLEX MICROSCOPIC (NOT AT [facility])  I-STAT CG4 LACTIC ACID, ED    Imaging Review No results found. I have personally reviewed and evaluated these images and lab results as part of my medical decision-making.   EKG Interpretation   Date/Time:  Saturday July 04 2015 18:44:39 EDT Ventricular Rate:  125 PR Interval:  127 QRS Duration: 77 QT Interval:  283 QTC Calculation: 408 R Axis:   74 Text Interpretation:  Sinus tachycardia No old tracing to compare  Confirmed by Ethelda ChickJACUBOWITZ  MD, Adaley Kiene [phone]([id number]) on 07/04/2015 6:47:18 PM     Results for orders placed or performed during the hospital encounter of 07/04/15  Lipase, blood  Result Value Ref Range   Lipase 27 11 - 51 U/L  Comprehensive metabolic panel  Result Value Ref Range   Sodium 139 135 - 145 mmol/L   Potassium 4.0 3.5 - 5.1 mmol/L   Chloride 106 101 - 111 mmol/L   CO2 24 22 - 32 mmol/L   Glucose, Bld 98 65 - 99 mg/dL   BUN 21 (H) 6 - 20 mg/dL   Creatinine, Ser 6.040.84 0.44 - 1.00 mg/dL   Calcium 9.2 8.9 - 54.010.3 mg/dL   Total Protein 7.5 6.5 - 8.1 g/dL   Albumin 4.1 3.5 - 5.0 g/dL   AST 21 15 - 41 U/L   ALT 17 14 - 54 U/L   Alkaline Phosphatase 97 38 - 126 U/L   Total Bilirubin 0.6 0.3 - 1.2 mg/dL   GFR calc non Af Amer >60 >60 mL/min   GFR calc Af Amer >60 >60 mL/min   Anion gap 9 5 - 15  CBC  Result Value Ref Range   WBC 8.4 4.0 - 10.5 K/uL   RBC 4.00 3.87 - 5.11 MIL/uL   Hemoglobin 12.4 12.0 - 15.0 g/dL   HCT 98.137.3 19.136.0 - 47.846.0 %   MCV 93.3 78.0 - 100.0 fL   MCH 31.0 26.0 - 34.0  pg   MCHC 33.2 30.0 - 36.0 g/dL   RDW 29.512.9 62.111.5 - 30.815.5 %   Platelets 255 150 - 400 K/uL  I-Stat CG4 Lactic Acid, ED  Result Value Ref Range   Lactic Acid, Venous 0.77 0.5 - 2.0 mmol/L  I-Stat CG4 Lactic Acid, ED  Result Value Ref Range   Lactic Acid, Venous 0.71 0.5 - 2.0 mmol/L   Dg Elbow Complete Left  07/04/2015  CLINICAL DATA:  Postoperative infection. Elbow pain, nausea and emesis, onset  last night. Surgery 3 days prior. EXAM: LEFT ELBOW - COMPLETE 3+ VIEW COMPARISON:  Radiographs and CT 05/01/2015 FINDINGS: Surgical drain in the lateral soft tissues. Surgical clips about the drain. Mild soft tissue edema, no soft tissue air. No elbow joint effusion. No erosion, periosteal reaction or bony destructive change. No acute osseous abnormalities. IMPRESSION: Soft tissue edema with surgical drain in place. No soft tissue air or radiographic findings of osteomyelitis. Electronically Signed   By: Rubye Oaks M.D.   On: 07/04/2015 20:45    MDM  I spoke with Dr. Kelly Splinter who will consult on patient and requests admission for intravenous antibiotics and hydration to hospitalist service. Spoke with Dr.Doutova will arrange for admission Final diagnoses:  None  Dx #1 wound infection #2 nausea and vomiting      Doug Sou, MD 07/04/15 2354

## 2015-07-04 NOTE — ED Notes (Signed)
Delay in bloodwork and antibiotics due to difficult IV start. IV team consult has been requested.

## 2015-07-04 NOTE — H&P (Addendum)
PCP: [name], NP    Referring provider  Jacubowitz   Chief Complaint:  Arm swelling  HPI: Sabrina [name] is a 49 y.o. female   has a past medical history of Bullous pemphigus; Staph infection; Cat bite; 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HCC); Situational anxiety; and PONV (postoperative nausea and vomiting).   Presented with  Patient has history By about a year ago resulting in osteomyelitis with notable repeated surgeries (22 surgical procedures) treated with wound vac and eventually requiring a skin graft. This was initially treated in [place] but patient has since moved to [place] last week she was admitted by plastics and hand excision of a left arm graft with drain placement and closure done on October 19.   Today patient presents complaining of diarrhea nausea vomiting for the past 24 hour, denies joint pain no cough no sore throat.  Reports  worsening elbow pain. She been having worsening drainage from the wound. Patient reports subjective fevers since this AM at home as well as MAXIMUM TEMPERATURE of 101.1  Patient was given Keflex postoperatively but given nausea and monitor and he was unable to hold it down. She reports decreased sensation that has affected her hand since the operation. ER provider has discussed case with plastic surgeon Dr. Kelly Splinter phone # [phone] or [phone] She will see patient in the morning for now recommends admission to medicine for IV antibiotics. Of note patient has been a difficult stick ECG was attempted by ER provider but was unsuccessful patient will likely need PICC line if IV team unable to place IV Hospitalist was called for admission for cellulitis and wound infection  Review of Systems:    Pertinent positives include:  Fevers, chills, abdominal pain, nausea, vomiting, diarrhea,  Constitutional:  No weight loss, night sweats, fatigue, weight loss  HEENT:  No headaches, Difficulty swallowing,Tooth/dental problems,Sore throat,    No sneezing, itching, ear ache, nasal congestion, post nasal drip,  Cardio-vascular:  No chest pain, Orthopnea, PND, anasarca, dizziness, palpitations.no Bilateral lower extremity swelling  GI:  No heartburn, indigestion,  change in bowel habits, loss of appetite, melena, blood in stool, hematemesis Resp:  no shortness of breath at rest. No dyspnea on exertion, No excess mucus, no productive cough, No non-productive cough, No coughing up of blood.No change in color of mucus.No wheezing. Skin:  no rash or lesions. No jaundice GU:  no dysuria, change in color of urine, no urgency or frequency. No straining to urinate.  No flank pain.  Musculoskeletal:  No joint pain or no joint swelling. No decreased range of motion. No back pain.  Psych:  No change in mood or affect. No depression or anxiety. No memory loss.  Neuro: no localizing neurological complaints, no tingling, no weakness, no double vision, no gait abnormality, no slurred speech, no confusion  Otherwise ROS are negative except for above, 10 systems were reviewed  Past Medical History: Past Medical History  Diagnosis Date  . Bullous pemphigus   . Staph infection   . Cat bite   . 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 Situational anxiety   . PONV (postoperative nausea and vomiting)    Past Surgical History  Procedure Laterality Date  . Abdominal hysterectomy    . Appendectomy    . Skin graft    . 22 surgeries on left elbow    . Debridement and closure wound Left 07/01/2015    Procedure: LEFT ELBOW EXCISION OF WOUND WITH PRIMARY CLOSURE 2X5 CM ;  Surgeon: Alena Bills  Dillingham, DO;  Location: [facility];  Service: Plastics;  Laterality: Left;     Medications: Prior to Admission medications   Medication Sig Start Date End Date Taking? Authorizing Provider  cephALEXin (KEFLEX) 500 MG capsule Take 1 capsule (500 mg total) by mouth 4 (four) times daily. 07/01/15  Yes Shawn M Rayburn, PA-C  gabapentin (NEURONTIN)  300 MG capsule Take 2 capsules (600 mg total) by mouth 3 (three) times daily. 06/22/15  Yes [name], NP  HYDROcodone-acetaminophen (NORCO) 5-325 MG tablet Take 1 tablet by mouth every 6 (six) hours as needed for moderate pain. 07/01/15  Yes Shawn M Rayburn, PA-C  traMADol (ULTRAM) 50 MG tablet Take 1 tablet (50 mg total) by mouth every 8 (eight) hours as needed. Patient not taking: Reported on 07/04/2015 06/22/15   [name], NP    Allergies:   Allergies  Allergen Reactions  . Morphine And Related Itching  . Zofran [Ondansetron Hcl]     Spots around iv site  . Droperidol Palpitations    Social History:  Ambulatory  Independently  Lives at home  With family     reports that she has never smoked. She does not have any smokeless tobacco history on file. She reports that she does not drink alcohol or use illicit drugs.    Family History: family history includes Cancer in her maternal grandmother and mother; Dementia in her mother; Liver disease in her mother.    Physical Exam: Patient Vitals for the past 24 hrs:  BP Temp Temp src Pulse Resp SpO2  07/04/15 2010 107/79 mmHg 98.9 F (37.2 C) - (!) 122 17 98 %  07/04/15 1839 115/66 mmHg 98.6 F (37 C) Oral (!) 135 18 -    1. General:  in No Acute distress 2. Psychological: Alert and  Oriented 3. Head/ENT:     Dry Mucous Membranes                          Head Non traumatic, neck supple                          Normal  Dentition 4. SKIN:  decreased Skin turgor,  Skin clean Dry  Area of erythema swelling and warmth left elbow     5. Heart: Regular rate and rhythm no Murmur, Rub or gallop 6. Lungs: Clear to auscultation bilaterally, no wheezes or crackles   7. Abdomen: Soft, non-tender, Non distended 8. Lower extremities: no clubbing, cyanosis, or edema 9. Neurologically Grossly intact, moving all 4 extremities equally 10. MSK: Normal range of motion  body mass index is unknown because there is no weight  on file.   Labs on Admission:   Results for orders placed or performed during the hospital encounter of 07/04/15 (from the past 24 hour(s))  Lipase, blood     Status: None   Collection Time: 07/04/15  6:50 PM  Result Value Ref Range   Lipase 27 11 - 51 U/L  Comprehensive metabolic panel     Status: Abnormal   Collection Time: 07/04/15  6:50 PM  Result Value Ref Range   Sodium 139 135 - 145 mmol/L   Potassium 4.0 3.5 - 5.1 mmol/L   Chloride 106 101 - 111 mmol/L   CO2 24 22 - 32 mmol/L   Glucose, Bld 98 65 - 99 mg/dL   BUN 21 (H) 6 - 20 mg/dL  Creatinine, Ser 0.84 0.44 - 1.00 mg/dL   Calcium 9.2 8.9 - 16.1 mg/dL   Total Protein 7.5 6.5 - 8.1 g/dL   Albumin 4.1 3.5 - 5.0 g/dL   AST 21 15 - 41 U/L   ALT 17 14 - 54 U/L   Alkaline Phosphatase 97 38 - 126 U/L   Total Bilirubin 0.6 0.3 - 1.2 mg/dL   GFR calc non Af Amer >60 >60 mL/min   GFR calc Af Amer >60 >60 mL/min   Anion gap 9 5 - 15  CBC     Status: None   Collection Time: 07/04/15  6:50 PM  Result Value Ref Range   WBC 8.4 4.0 - 10.5 K/uL   RBC 4.00 3.87 - 5.11 MIL/uL   Hemoglobin 12.4 12.0 - 15.0 g/dL   HCT 09.6 04.5 - 40.9 %   MCV 93.3 78.0 - 100.0 fL   MCH 31.0 26.0 - 34.0 pg   MCHC 33.2 30.0 - 36.0 g/dL   RDW 81.1 91.4 - 78.2 %   Platelets 255 150 - 400 K/uL  I-Stat CG4 Lactic Acid, ED     Status: None   Collection Time: 07/04/15  7:01 PM  Result Value Ref Range   Lactic Acid, Venous 0.77 0.5 - 2.0 mmol/L    UA not obtained  No results found for: HGBA1C  Estimated Creatinine Clearance: 88 mL/min (by C-G formula based on Cr of 0.84).  BNP (last 3 results) No results for input(s): PROBNP in the last 8760 hours.  Other results:  I have pearsonaly reviewed this: ECG REPORT  Rate: 125  Rhythm: Sinus tachy ST&T Change: no ischemic changes QTC 408   There were no vitals filed for this visit.   Cultures:    Component Value Date/Time   SDES ARM 05/01/2015 1220   SPECREQUEST Normal 05/01/2015 1220     CULT  05/01/2015 1220    MODERATE STAPHYLOCOCCUS AUREUS Note: RIFAMPIN AND GENTAMICIN SHOULD NOT BE USED AS SINGLE DRUGS FOR TREATMENT OF STAPH INFECTIONS. Performed at [facility]    REPTSTATUS 05/04/2015 FINAL 05/01/2015 1220     Radiological Exams on Admission: Dg Elbow Complete Left  07/04/2015  CLINICAL DATA:  Postoperative infection. Elbow pain, nausea and emesis, onset last night. Surgery 3 days prior. EXAM: LEFT ELBOW - COMPLETE 3+ VIEW COMPARISON:  Radiographs and CT 05/01/2015 FINDINGS: Surgical drain in the lateral soft tissues. Surgical clips about the drain. Mild soft tissue edema, no soft tissue air. No elbow joint effusion. No erosion, periosteal reaction or bony destructive change. No acute osseous abnormalities. IMPRESSION: Soft tissue edema with surgical drain in place. No soft tissue air or radiographic findings of osteomyelitis. Electronically Signed   By: Rubye Oaks M.D.   On: 07/04/2015 20:45    Chart has been reviewed  Family at  Bedside  plan of care was discussed with Joni Reining Mabry [phone] not a MPOA   Assessment/Plan  49 year old female with chronic osteomyelitis and chronic wound for the past one year secondary to cat bite presents of recurrent wound infection being admitted for broad-spectrum antibiotics and plastic surgery consult in the morning  Present on Admission:  . Skin ulcer of upper arm, limited to breakdown of skin (HCC) Wound infection - we'll admit for IV antibiotics will cover broadly with vancomycin and Zosyn. At this point patient is afebrile no leukocytosis, lactic acid within normal limits does not meet SIRS  criteria but will monitor carefully. No IV currently will order  PICC for now. Meanwhile if no IV access will treat with IM clindamycin and rocephin until have PICC line in place.  Dehydration - patient have had recurrent nausea vomiting and diarrhea. Will rehydrate given take cardiac monitor on telemetry Sinus  tachycardia in the setting of dehydration Will rehydrate and monitor on telemetry Diarrhea likely secondary to viral gastroenteritis will obtain stool cultures and C. difficile given recent exposure to antibiotics Prophylaxis SCD  CODE STATUS:  FULL CODE   as per patient   Disposition:   To home once workup is complete and patient is stable  Other plan as per orders.  I have spent a total of 65 min on this admission extra time was taken to discuss with pharmacy  Cielle Aguila 07/04/2015, 8:58 PM  [facility]  Pager [phone]   after 2 AM please page floor coverage PA If 7AM-7PM, please contact the day team taking care of the patient  [email or web address]  Password [email or web address]

## 2015-07-04 NOTE — ED Notes (Signed)
Pt transported to XRAY °

## 2015-07-04 NOTE — ED Notes (Signed)
IV team at pt bedside 

## 2015-07-04 NOTE — ED Notes (Signed)
Pt c/o diarrhea, emesis, nausea onset last night, and elbow pain. Pt had surgery on Wednesday 10/19 for graft removal, drain in place draining light red serosanguinous fluid. Graft was placed one year ago s/t cat bite with staph infection.

## 2015-07-04 NOTE — ED Notes (Signed)
IV team paged.  

## 2015-07-05 ENCOUNTER — Encounter (HOSPITAL_COMMUNITY): Payer: Self-pay | Admitting: *Deleted

## 2015-07-05 LAB — URINALYSIS, ROUTINE W REFLEX MICROSCOPIC
BILIRUBIN URINE: NEGATIVE
Glucose, UA: NEGATIVE mg/dL
HGB URINE DIPSTICK: NEGATIVE
Ketones, ur: NEGATIVE mg/dL
Leukocytes, UA: NEGATIVE
NITRITE: NEGATIVE
PROTEIN: NEGATIVE mg/dL
Specific Gravity, Urine: 1.025 (ref 1.005–1.030)
UROBILINOGEN UA: 0.2 mg/dL (ref 0.0–1.0)
pH: 6.5 (ref 5.0–8.0)

## 2015-07-05 LAB — PREALBUMIN: PREALBUMIN: 21.6 mg/dL (ref 18–38)

## 2015-07-05 MED ORDER — PIPERACILLIN-TAZOBACTAM 3.375 G IVPB
3.3750 g | Freq: Three times a day (TID) | INTRAVENOUS | Status: DC
  Administered 2015-07-05 – 2015-07-09 (×13): 3.375 g via INTRAVENOUS
  Filled 2015-07-05 (×14): qty 50

## 2015-07-05 MED ORDER — SODIUM CHLORIDE 0.9 % IJ SOLN
10.0000 mL | INTRAMUSCULAR | Status: DC | PRN
  Administered 2015-07-06 – 2015-07-09 (×4): 10 mL
  Filled 2015-07-05 (×4): qty 40

## 2015-07-05 MED ORDER — PROMETHAZINE HCL 25 MG/ML IJ SOLN
12.5000 mg | Freq: Four times a day (QID) | INTRAMUSCULAR | Status: DC | PRN
  Administered 2015-07-05 – 2015-07-09 (×15): 12.5 mg via INTRAVENOUS
  Filled 2015-07-05 (×16): qty 1

## 2015-07-05 MED ORDER — VANCOMYCIN HCL IN DEXTROSE 1-5 GM/200ML-% IV SOLN
1000.0000 mg | Freq: Three times a day (TID) | INTRAVENOUS | Status: DC
  Administered 2015-07-05 – 2015-07-09 (×13): 1000 mg via INTRAVENOUS
  Filled 2015-07-05 (×14): qty 200

## 2015-07-05 MED ORDER — SODIUM CHLORIDE 0.9 % IV SOLN
INTRAVENOUS | Status: DC
  Administered 2015-07-05 – 2015-07-09 (×7): via INTRAVENOUS

## 2015-07-05 MED ORDER — HYDROMORPHONE HCL 1 MG/ML IJ SOLN
1.0000 mg | INTRAMUSCULAR | Status: DC | PRN
  Administered 2015-07-05 – 2015-07-09 (×30): 1 mg via INTRAVENOUS
  Filled 2015-07-05 (×30): qty 1

## 2015-07-05 NOTE — Progress Notes (Signed)
Peripherally Inserted Central Catheter/Midline Placement  The IV Nurse has discussed with the patient and/or persons authorized to consent for the patient, the purpose of this procedure and the potential benefits and risks involved with this procedure.  The benefits include less needle sticks, lab draws from the catheter and patient may be discharged home with the catheter.  Risks include, but not limited to, infection, bleeding, blood clot (thrombus formation), and puncture of an artery; nerve damage and irregular heat beat.  Alternatives to this procedure were also discussed.  PICC/Midline Placement Documentation  PICC / Midline Single Lumen 07/05/15 PICC Right Basilic 36 cm 0 cm (Active)  Indication for Insertion or Continuance of Line Home intravenous therapies (PICC only);Prolonged intravenous therapies 07/05/2015 10:41 AM  Exposed Catheter (cm) 0 cm 07/05/2015 10:41 AM  Site Assessment Clean;Dry;Intact 07/05/2015 10:41 AM  Line Status Saline locked;Flushed;Blood return noted 07/05/2015 10:41 AM  Dressing Type Transparent 07/05/2015 10:41 AM  Dressing Status Clean;Dry;Intact;Antimicrobial disc in place 07/05/2015 10:41 AM  Line Care Connections checked and tightened 07/05/2015 10:41 AM  Line Adjustment (NICU/IV Team Only) No 07/05/2015 10:41 AM  Dressing Intervention New dressing 07/05/2015 10:41 AM  Dressing Change Due 07/12/15 07/05/2015 10:41 AM       Elliot Dallyiggs, Yakov Bergen Wright 07/05/2015, 10:41 AM

## 2015-07-05 NOTE — Progress Notes (Signed)
ANTIBIOTIC CONSULT NOTE - INITIAL  Pharmacy Consult for zosyn/vancomycin Indication: Wound infection  Allergies  Allergen Reactions  . Morphine And Related Itching  . Zofran [Ondansetron Hcl]     Spots around iv site  . Droperidol Palpitations    Patient Measurements: Height: 5\' 4"  (162.6 cm) Weight: 194 lb 7.1 oz (88.2 kg) IBW/kg (Calculated) : 54.7   Vital Signs: Temp: 99.6 F (37.6 C) (10/22 2349) Temp Source: Oral (10/22 2349) BP: 112/72 mmHg (10/22 2349) Pulse Rate: 110 (10/22 2349) Intake/Output from previous day:   Intake/Output from this shift:    Labs:  Recent Labs  07/04/15 1850  WBC 8.4  HGB 12.4  PLT 255  CREATININE 0.84   Estimated Creatinine Clearance: 89 mL/min (by C-G formula based on Cr of 0.84). No results for input(s): VANCOTROUGH, VANCOPEAK, VANCORANDOM, GENTTROUGH, GENTPEAK, GENTRANDOM, TOBRATROUGH, TOBRAPEAK, TOBRARND, AMIKACINPEAK, AMIKACINTROU, AMIKACIN in the last 72 hours.   Microbiology: No results found for this or any previous visit (from the past 720 hour(s)).  Medical History: Past Medical History  Diagnosis Date  . Bullous pemphigus   . Staph infection   . Cat bite   . 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 Situational anxiety   . PONV (postoperative nausea and vomiting)     Medications:  Prescriptions prior to admission  Medication Sig Dispense Refill Last Dose  . cephALEXin (KEFLEX) 500 MG capsule Take 1 capsule (500 mg total) by mouth 4 (four) times daily. 28 capsule 0 07/03/2015 at Unknown time  . gabapentin (NEURONTIN) 300 MG capsule Take 2 capsules (600 mg total) by mouth 3 (three) times daily. 180 capsule 3 07/03/2015 at Unknown time  . HYDROcodone-acetaminophen (NORCO) 5-325 MG tablet Take 1 tablet by mouth every 6 (six) hours as needed for moderate pain. 40 tablet 0 07/03/2015 at Unknown time  . traMADol (ULTRAM) 50 MG tablet Take 1 tablet (50 mg total) by mouth every 8 (eight) hours as needed. (Patient not taking:  Reported on 07/04/2015) 30 tablet 0 Not Taking at Unknown time   Scheduled:  . gabapentin  600 mg Oral TID  . metoCLOPramide (REGLAN) injection  5 mg Intravenous 4 times per day  . piperacillin-tazobactam (ZOSYN)  IV  3.375 g Intravenous Q8H  . sodium chloride  3 mL Intravenous Q12H  . vancomycin  1,000 mg Intravenous Once  . vancomycin  1,000 mg Intravenous Q8H   Infusions:  . sodium chloride 125 mL/hr at 07/04/15 2356   Assessment: 49 yoF s/p hand excision of left arm graft and drain on 10/19.  Hx of multiple past surgeries and osteomyelitis now with arm swelling. Vancomycin/zosyn per Rx for wound infection.   Goal of Therapy:  Vancomycin trough level 15-20 mcg/ml  Plan:   Zosyn 3.375 Gm IV q8h EI  Vancomycin 1Gm IV q8h  F/u Scr/cultures/levels as needed  Susanne GreenhouseGreen, Elbia Paro R 07/05/2015,12:12 AM

## 2015-07-05 NOTE — Progress Notes (Signed)
  Subjective: The patient underwent excision of a left elbow wound 3 days ago.  She started feeling nauseate and having difficulty retaining food and fluids.    Objective: Vital signs in last 24 hours: Temp:  [98.6 F (37 C)-99.6 F (37.6 C)] 99.6 F (37.6 C) (10/23 0409) Pulse Rate:  [101-135] 101 (10/23 1020) Resp:  [17-18] 18 (10/23 1020) BP: (99-115)/(60-95) 111/95 mmHg (10/23 1020) SpO2:  [96 %-100 %] 97 % (10/23 1020) Weight:  [88.2 kg (194 lb 7.1 oz)] 88.2 kg (194 lb 7.1 oz) (10/22 2349) Last BM Date: 07/04/15  Intake/Output from previous day: 10/22 0701 - 10/23 0700 In: 1003.3 [P.O.:120; I.V.:883.3] Out: 600 [Urine:600] Intake/Output this shift: Total I/O In: -  Out: 600 [Urine:600]  General appearance: alert, cooperative and no distress Incision/Wound: Mildly red and swollen.  Drain working as expected with serosanginous fluid.  Lab Results:   Recent Labs  07/04/15 1850 07/05/15 0544  WBC 8.4 12.7*  HGB 12.4 9.1*  HCT 37.3 29.1*  PLT 255 344   BMET  Recent Labs  07/04/15 1850 07/05/15 0544  NA 139 133*  K 4.0 4.1  CL 106 103  CO2 24 25  GLUCOSE 98 112*  BUN 21* 8  CREATININE 0.84 0.97  CALCIUM 9.2 8.7*   PT/INR No results for input(s): LABPROT, INR in the last 72 hours. ABG No results for input(s): PHART, HCO3 in the last 72 hours.  Invalid input(s): PCO2, PO2  Studies/Results: Dg Elbow Complete Left  07/04/2015  CLINICAL DATA:  Postoperative infection. Elbow pain, nausea and emesis, onset last night. Surgery 3 days prior. EXAM: LEFT ELBOW - COMPLETE 3+ VIEW COMPARISON:  Radiographs and CT 05/01/2015 FINDINGS: Surgical drain in the lateral soft tissues. Surgical clips about the drain. Mild soft tissue edema, no soft tissue air. No elbow joint effusion. No erosion, periosteal reaction or bony destructive change. No acute osseous abnormalities. IMPRESSION: Soft tissue edema with surgical drain in place. No soft tissue air or radiographic  findings of osteomyelitis. Electronically Signed   By: Rubye OaksMelanie  Ehinger M.D.   On: 07/04/2015 20:45    Anti-infectives: Anti-infectives    Start     Dose/Rate Route Frequency Ordered Stop   07/05/15 0800  vancomycin (VANCOCIN) IVPB 1000 mg/200 mL premix     1,000 mg 200 mL/hr over 60 Minutes Intravenous Every 8 hours 07/05/15 0009     07/05/15 0800  piperacillin-tazobactam (ZOSYN) IVPB 3.375 g     3.375 g 12.5 mL/hr over 240 Minutes Intravenous Every 8 hours 07/05/15 0010     07/04/15 2345  vancomycin (VANCOCIN) IVPB 1000 mg/200 mL premix     1,000 mg 200 mL/hr over 60 Minutes Intravenous  Once 07/04/15 2322 07/05/15 0105   07/04/15 2330  piperacillin-tazobactam (ZOSYN) IVPB 3.375 g     3.375 g 100 mL/hr over 30 Minutes Intravenous  Once 07/04/15 2321     07/04/15 2015  piperacillin-tazobactam (ZOSYN) IVPB 3.375 g  Status:  Discontinued     3.375 g 12.5 mL/hr over 240 Minutes Intravenous  Once 07/04/15 2014 07/04/15 2321   07/04/15 2015  vancomycin (VANCOCIN) IVPB 1000 mg/200 mL premix  Status:  Discontinued     1,000 mg 200 mL/hr over 60 Minutes Intravenous  Once 07/04/15 2014 07/04/15 2322      Assessment/Plan: s/p * No surgery found * Agree with another day of IV antibiotics.  LOS: 1 day    Peggye FormCLAIRE S DILLINGHAM 07/05/2015

## 2015-07-05 NOTE — Progress Notes (Signed)
[facility] PROGRESS NOTE  Sabrina LabradorDana [name] ZOX:[id number] DOB: 1966-01-14 DOA: 07/04/2015 PCP: Concepcion LivingBERNHARDT, [name], NP  Assessment/Plan: 1. Wound Infection/chronic Elbow infection -s/p Excision of left arm wound with primary layered closure 6 x 18 cm on 10/19 -drain with bloody discharge -On IVF, Broad spectrum Abx-Vanc and Zosyn -Dr.Sanger consulted last pm to FU today  2. Sepsis -due to #1 -improving  DVT proph: start pending Surg eval  Code Status: Full Code Family Communication: none at bedside Disposition Plan: home when stable   Consultants:  Dr.Sanger   HPI/Subjective: Feels better, L arm painful at surg site  Objective: Filed Vitals:   07/05/15 0409  BP: 99/60  Pulse: 104  Temp: 99.6 F (37.6 C)  Resp: 18    Intake/Output Summary (Last 24 hours) at 07/05/15 0747 Last data filed at 07/05/15 0700  Gross per 24 hour  Intake 1003.33 ml  Output    600 ml  Net 403.33 ml   Filed Weights   07/04/15 2349  Weight: 88.2 kg (194 lb 7.1 oz)    Exam:   General: AAOx3  Cardiovascular: S1S2/RRR  Respiratory: CTAB  Abdomen: soft, NT, BS present  Musculoskeletal: L elbow with erythema, warmth, tenderness, drain noted   Data Reviewed: Basic Metabolic Panel:  Recent Labs Lab 07/04/15 1850 07/05/15 0544  NA 139 133*  K 4.0 4.1  CL 106 103  CO2 24 25  GLUCOSE 98 112*  BUN 21* 8  CREATININE 0.84 0.97  CALCIUM 9.2 8.7*  MG  --  1.9  PHOS  --  2.9   Liver Function Tests:  Recent Labs Lab 07/04/15 1850 07/05/15 0544  AST 21 23  ALT 17 13*  ALKPHOS 97 42  BILITOT 0.6 0.2*  PROT 7.5 6.1*  ALBUMIN 4.1 3.0*    Recent Labs Lab 07/04/15 1850  LIPASE 27   No results for input(s): AMMONIA in the last 168 hours. CBC:  Recent Labs Lab 07/04/15 1850 07/05/15 0544  WBC 8.4 12.7*  HGB 12.4 9.1*  HCT 37.3 29.1*  MCV 93.3 78.0  PLT 255 344   Cardiac Enzymes: No results for input(s): CKTOTAL, CKMB, CKMBINDEX, TROPONINI in the last  168 hours. BNP (last 3 results) No results for input(s): BNP in the last 8760 hours.  ProBNP (last 3 results) No results for input(s): PROBNP in the last 8760 hours.  CBG: No results for input(s): GLUCAP in the last 168 hours.  No results found for this or any previous visit (from the past 240 hour(s)).   Studies: Dg Elbow Complete Left  07/04/2015  CLINICAL DATA:  Postoperative infection. Elbow pain, nausea and emesis, onset last night. Surgery 3 days prior. EXAM: LEFT ELBOW - COMPLETE 3+ VIEW COMPARISON:  Radiographs and CT 05/01/2015 FINDINGS: Surgical drain in the lateral soft tissues. Surgical clips about the drain. Mild soft tissue edema, no soft tissue air. No elbow joint effusion. No erosion, periosteal reaction or bony destructive change. No acute osseous abnormalities. IMPRESSION: Soft tissue edema with surgical drain in place. No soft tissue air or radiographic findings of osteomyelitis. Electronically Signed   By: Rubye OaksMelanie  Ehinger M.D.   On: 07/04/2015 20:45    Scheduled Meds: . gabapentin  600 mg Oral TID  . metoCLOPramide (REGLAN) injection  5 mg Intravenous 4 times per day  . piperacillin-tazobactam (ZOSYN)  IV  3.375 g Intravenous Once  . piperacillin-tazobactam (ZOSYN)  IV  3.375 g Intravenous Q8H  . sodium chloride  3 mL Intravenous Q12H  . vancomycin  1,000  mg Intravenous Q8H   Continuous Infusions: . sodium chloride 125 mL/hr at 07/04/15 2356   Antibiotics Given (last 72 hours)    Date/Time Action Medication Dose Rate   07/05/15 0005 Given   vancomycin (VANCOCIN) IVPB 1000 mg/200 mL premix 1,000 mg 200 mL/hr   07/05/15 0715 Given   piperacillin-tazobactam (ZOSYN) IVPB 3.375 g 3.375 g 12.5 mL/hr   07/05/15 0717 Given   vancomycin (VANCOCIN) IVPB 1000 mg/200 mL premix 1,000 mg 200 mL/hr      Active Problems:   Skin ulcer of upper arm, limited to breakdown of skin (HCC)   Wound infection (HCC)   Dehydration   Tachycardia   Diarrhea   Wound infection  after surgery    Time spent: 71    [facility]  [facility] Pager [phone]. If 7PM-7AM, please contact night-coverage at [email or web address], password [facility] 07/05/2015, 7:47 AM  LOS: 1 day

## 2015-07-06 LAB — BASIC METABOLIC PANEL
Anion gap: 7 (ref 5–15)
BUN: 17 mg/dL (ref 6–20)
CALCIUM: 8.8 mg/dL — AB (ref 8.9–10.3)
CHLORIDE: 106 mmol/L (ref 101–111)
CO2: 27 mmol/L (ref 22–32)
CREATININE: 0.8 mg/dL (ref 0.44–1.00)
GFR calc non Af Amer: >60 mL/min (ref >60)
GLUCOSE: 94 mg/dL (ref 65–99)
Potassium: 3.9 mmol/L (ref 3.5–5.1)
Sodium: 140 mmol/L (ref 135–145)

## 2015-07-06 LAB — CBC
HCT: 31.5 % — ABNORMAL LOW (ref 36.0–46.0)
Hemoglobin: 10.3 g/dL — ABNORMAL LOW (ref 12.0–15.0)
MCH: 31.5 pg (ref 26.0–34.0)
MCHC: 32.7 g/dL (ref 30.0–36.0)
MCV: 96.3 fL (ref 78.0–100.0)
PLATELETS: 228 10*3/uL (ref 150–400)
RBC: 3.27 MIL/uL — AB (ref 3.87–5.11)
RDW: 13.3 % (ref 11.5–15.5)
WBC: 4.8 10*3/uL (ref 4.0–10.5)

## 2015-07-06 NOTE — Care Management Note (Signed)
Case Management Note  Patient Details  Name: Sabrina [name] MRN: [id number] Date of Birth: Dec 16, 1965  Subjective/Objective:  49 y/o f admitted w/wound infection-elbow. From home.                  Action/Plan:d/c plan home.   Expected Discharge Date:                  Expected Discharge Plan:  Home/Self Care  In-House Referral:     Discharge planning Services  CM Consult, Indigent Health Clinic, Medication Assistance  Post Acute Care Choice:    Choice offered to:     DME Arranged:    DME Agency:     HH Arranged:    HH Agency:     Status of Service:  In process, will continue to follow  Medicare Important Message Given:    Date Medicare IM Given:    Medicare IM give by:    Date Additional Medicare IM Given:    Additional Medicare Important Message give by:     If discussed at Long Length of Stay Meetings, dates discussed:    Additional Comments:  Sabrina [name], Sabrina Ferrall, RN 07/06/2015, 2:16 PM

## 2015-07-06 NOTE — Progress Notes (Addendum)
[facility] PROGRESS NOTE  Sabrina LabradorDana [name] ZDG:[id number] DOB: 10-01-1965 DOA: 07/04/2015 PCP: Sabrina LivingBERNHARDT, [name], NP   Narrative:  49/F with PMH of R arm wound following cat bite 1year back and multiple surgeries since, recent Arm surgery on 10/19 admitted with worsening swelling and pain around the surgical site. Plastics Sabrina [name] consulting  Assessment/Plan: 1. Wound Infection/chronic Elbow infection -s/p Excision of left arm wound with primary layered closure 6 x 18 cm on 10/19 -drain with bloody discharge -improving, cultures negative, WBC pending this am -On IVF, Broad spectrum Abx-Vanc and Zosyn -Sabrina [name] following  2. Sepsis -due to #1 -improving  DVT proph: start pending Surg eval  Code Status: Full Code Family Communication: none at bedside Disposition Plan: home per Dr.Dillinghams recs   Consultants:  Sabrina [name]   HPI/Subjective: Feels better, L arm painful at surg site  Objective: Filed Vitals:   07/06/15 0516  BP: 102/57  Pulse: 98  Temp: 98.5 F (36.9 C)  Resp: 16    Intake/Output Summary (Last 24 hours) at 07/06/15 1134 Last data filed at 07/06/15 1133  Gross per 24 hour  Intake   2900 ml  Output   1710 ml  Net   1190 ml   Filed Weights   07/04/15 2349  Weight: 88.2 kg (194 lb 7.1 oz)    Exam:   General: AAOx3  Cardiovascular: S1S2/RRR  Respiratory: CTAB  Abdomen: soft, NT, BS present  Musculoskeletal: L elbow with erythema, less warmth and tenderness, drain noted   Data Reviewed: Basic Metabolic Panel:  Recent Labs Lab 07/04/15 1850 07/05/15 0544 07/06/15 0334  NA 139 133* 140  K 4.0 4.1 3.9  CL 106 103 106  CO2 24 25 27   GLUCOSE 98 112* 94  BUN 21* 8 17  CREATININE 0.84 0.97 0.80  CALCIUM 9.2 8.7* 8.8*  MG  --  1.9  --   PHOS  --  2.9  --    Liver Function Tests:  Recent Labs Lab 07/04/15 1850 07/05/15 0544  AST 21 23  ALT 17 13*  ALKPHOS 97 42  BILITOT 0.6 0.2*  PROT 7.5 6.1*  ALBUMIN 4.1  3.0*    Recent Labs Lab 07/04/15 1850  LIPASE 27   No results for input(s): AMMONIA in the last 168 hours. CBC:  Recent Labs Lab 07/04/15 1850 07/05/15 0544  WBC 8.4 12.7*  HGB 12.4 9.1*  HCT 37.3 29.1*  MCV 93.3 78.0  PLT 255 344   Cardiac Enzymes: No results for input(s): CKTOTAL, CKMB, CKMBINDEX, TROPONINI in the last 168 hours. BNP (last 3 results) No results for input(s): BNP in the last 8760 hours.  ProBNP (last 3 results) No results for input(s): PROBNP in the last 8760 hours.  CBG: No results for input(s): GLUCAP in the last 168 hours.  No results found for this or any previous visit (from the past 240 hour(s)).   Studies: Dg Elbow Complete Left  07/04/2015  CLINICAL DATA:  Postoperative infection. Elbow pain, nausea and emesis, onset last night. Surgery 3 days prior. EXAM: LEFT ELBOW - COMPLETE 3+ VIEW COMPARISON:  Radiographs and CT 05/01/2015 FINDINGS: Surgical drain in the lateral soft tissues. Surgical clips about the drain. Mild soft tissue edema, no soft tissue air. No elbow joint effusion. No erosion, periosteal reaction or bony destructive change. No acute osseous abnormalities. IMPRESSION: Soft tissue edema with surgical drain in place. No soft tissue air or radiographic findings of osteomyelitis. Electronically Signed   By: Rubye OaksMelanie  Ehinger M.D.   On: 07/04/2015  20:45    Scheduled Meds: . gabapentin  600 mg Oral TID  . metoCLOPramide (REGLAN) injection  5 mg Intravenous 4 times per day  . piperacillin-tazobactam (ZOSYN)  IV  3.375 g Intravenous Once  . piperacillin-tazobactam (ZOSYN)  IV  3.375 g Intravenous Q8H  . sodium chloride  3 mL Intravenous Q12H  . vancomycin  1,000 mg Intravenous Q8H   Continuous Infusions: . sodium chloride 100 mL/hr at 07/06/15 0616   Antibiotics Given (last 72 hours)    Date/Time Action Medication Dose Rate   07/05/15 0005 Given   vancomycin (VANCOCIN) IVPB 1000 mg/200 mL premix 1,000 mg 200 mL/hr   07/05/15 0715  Given   piperacillin-tazobactam (ZOSYN) IVPB 3.375 g 3.375 g 12.5 mL/hr   07/05/15 0717 Given   vancomycin (VANCOCIN) IVPB 1000 mg/200 mL premix 1,000 mg 200 mL/hr   07/05/15 1548 Given   piperacillin-tazobactam (ZOSYN) IVPB 3.375 g 3.375 g 12.5 mL/hr   07/05/15 1549 Given   vancomycin (VANCOCIN) IVPB 1000 mg/200 mL premix 1,000 mg 200 mL/hr   07/05/15 2354 Given   vancomycin (VANCOCIN) IVPB 1000 mg/200 mL premix 1,000 mg 200 mL/hr   07/05/15 2354 Given   piperacillin-tazobactam (ZOSYN) IVPB 3.375 g 3.375 g 12.5 mL/hr   07/06/15 0757 Given   vancomycin (VANCOCIN) IVPB 1000 mg/200 mL premix 1,000 mg 200 mL/hr   07/06/15 0757 Given   piperacillin-tazobactam (ZOSYN) IVPB 3.375 g 3.375 g 12.5 mL/hr      Active Problems:   Skin ulcer of upper arm, limited to breakdown of skin (HCC)   Wound infection (HCC)   Dehydration   Tachycardia   Diarrhea   Wound infection after surgery    Time spent: 24    [facility]  [facility] Pager [phone]. If 7PM-7AM, please contact night-coverage at [email or web address], password [facility] 07/06/2015, 11:34 AM  LOS: 2 days

## 2015-07-06 NOTE — Care Management Note (Signed)
Case Management Note  Patient Details  Name: [name] Reddix MRN: [id number] Date of Birth: 04/14/1966  Subjective/Objective: Patient has pcp, pharmacy, medicaid potential, financial counselor following.Noted on iv abx. From home.                   Action/Plan:dj/c plan home.   Expected Discharge Date:                  Expected Discharge Plan:  Home/Self Care  In-House Referral:     Discharge planning Services  CM Consult, Medication Assistance  Post Acute Care Choice:    Choice offered to:     DME Arranged:    DME Agency:     HH Arranged:    HH Agency:     Status of Service:  In process, will continue to follow  Medicare Important Message Given:    Date Medicare IM Given:    Medicare IM give by:    Date Additional Medicare IM Given:    Additional Medicare Important Message give by:     If discussed at Long Length of Stay Meetings, dates discussed:    Additional Comments:  Ismael ClamMahabir, Kamarrion Stfort, RN 07/06/2015, 4:05 PM

## 2015-07-07 LAB — CBC

## 2015-07-07 LAB — COMPREHENSIVE METABOLIC PANEL

## 2015-07-07 LAB — MAGNESIUM

## 2015-07-07 LAB — HEMOGLOBIN A1C

## 2015-07-07 LAB — TSH

## 2015-07-07 LAB — PHOSPHORUS

## 2015-07-07 LAB — LACTIC ACID, PLASMA

## 2015-07-07 MED ORDER — DOXYCYCLINE HYCLATE 100 MG PO TABS: 100.0000 mg | ORAL_TABLET | Freq: Two times a day (BID) | ORAL | Status: DC

## 2015-07-07 MED ORDER — AMOXICILLIN-POT CLAVULANATE 500-125 MG PO TABS: 1.0000 | ORAL_TABLET | Freq: Three times a day (TID) | ORAL | Status: DC

## 2015-07-07 MED ORDER — HYDROCODONE-ACETAMINOPHEN 5-325 MG PO TABS: 1.0000 | ORAL_TABLET | Freq: Four times a day (QID) | ORAL | Status: DC | PRN

## 2015-07-07 NOTE — Progress Notes (Signed)
[facility] PROGRESS NOTE  Sabrina LabradorDana [name] ZOX:[id number] DOB: November 08, 1965 DOA: 07/04/2015 PCP: Concepcion LivingBERNHARDT, [name], NP   Narrative:  49/F with PMH of R arm wound following cat bite 1year back and multiple surgeries since, recent Arm surgery on 10/19 admitted with worsening swelling and pain around the surgical site. Plastics Dr.Dillingham consulting  Assessment/Plan: 1. Wound Infection/chronic Elbow infection -s/p Excision of left arm wound with primary layered closure 6 x 18 cm on 10/19 -drain with bloody discharge -improving, cultures negative, WBC normalized , no fevers -On IVF, Broad spectrum Abx-Vanc and Zosyn -without significant change -Dr.Dillingham following, possible Dc home today on PO Augmentin and Doxycycline with close Fu with Dr.Dillingham next week  2. Sepsis -due to #1 -improving  DVT proph: lovenox  Code Status: Full Code Family Communication: none at bedside Disposition Plan: home per Dr.Dillinghams recs   Consultants:  Dr.Sanger   HPI/Subjective: Feels ok overall but still without much change in pain or swelling  Objective: Filed Vitals:   07/07/15 1334  BP: 109/66  Pulse: 96  Temp: 98.1 F (36.7 C)  Resp: 16    Intake/Output Summary (Last 24 hours) at 07/07/15 1658 Last data filed at 07/07/15 1455  Gross per 24 hour  Intake 3631.67 ml  Output    200 ml  Net 3431.67 ml   Filed Weights   07/04/15 2349  Weight: 88.2 kg (194 lb 7.1 oz)    Exam:   General: AAOx3  Cardiovascular: S1S2/RRR  Respiratory: CTAB  Abdomen: soft, NT, BS present  Musculoskeletal: L elbow with erythema, slightly less warmth and tenderness, drain noted   Data Reviewed: Basic Metabolic Panel:  Recent Labs Lab 07/04/15 1850 07/05/15 0544 07/06/15 0334  NA 139 QUESTIONABLE IDENTIFICATION / INCORRECTLY LABELED SPECIMEN 140  K 4.0 QUESTIONABLE IDENTIFICATION / INCORRECTLY LABELED SPECIMEN 3.9  CL 106 QUESTIONABLE IDENTIFICATION / INCORRECTLY LABELED  SPECIMEN 106  CO2 24 QUESTIONABLE IDENTIFICATION / INCORRECTLY LABELED SPECIMEN 27  GLUCOSE 98 QUESTIONABLE IDENTIFICATION / INCORRECTLY LABELED SPECIMEN 94  BUN 21* QUESTIONABLE IDENTIFICATION / INCORRECTLY LABELED SPECIMEN 17  CREATININE 0.84 QUESTIONABLE IDENTIFICATION / INCORRECTLY LABELED SPECIMEN 0.80  CALCIUM 9.2 QUESTIONABLE IDENTIFICATION / INCORRECTLY LABELED SPECIMEN 8.8*  MG  --  QUESTIONABLE IDENTIFICATION / INCORRECTLY LABELED SPECIMEN  --   PHOS  --  QUESTIONABLE IDENTIFICATION / INCORRECTLY LABELED SPECIMEN  --    Liver Function Tests:  Recent Labs Lab 07/04/15 1850 07/05/15 0544  AST 21 QUESTIONABLE IDENTIFICATION / INCORRECTLY LABELED SPECIMEN  ALT 17 QUESTIONABLE IDENTIFICATION / INCORRECTLY LABELED SPECIMEN  ALKPHOS 97 QUESTIONABLE IDENTIFICATION / INCORRECTLY LABELED SPECIMEN  BILITOT 0.6 QUESTIONABLE IDENTIFICATION / INCORRECTLY LABELED SPECIMEN  PROT 7.5 QUESTIONABLE IDENTIFICATION / INCORRECTLY LABELED SPECIMEN  ALBUMIN 4.1 QUESTIONABLE IDENTIFICATION / INCORRECTLY LABELED SPECIMEN    Recent Labs Lab 07/04/15 1850  LIPASE 27   No results for input(s): AMMONIA in the last 168 hours. CBC:  Recent Labs Lab 07/04/15 1850 07/05/15 0544 07/06/15 1115  WBC 8.4 QUESTIONABLE IDENTIFICATION / INCORRECTLY LABELED SPECIMEN 4.8  HGB 12.4 QUESTIONABLE IDENTIFICATION / INCORRECTLY LABELED SPECIMEN 10.3*  HCT 37.3 QUESTIONABLE IDENTIFICATION / INCORRECTLY LABELED SPECIMEN 31.5*  MCV 93.3 QUESTIONABLE IDENTIFICATION / INCORRECTLY LABELED SPECIMEN 96.3  PLT 255 QUESTIONABLE IDENTIFICATION / INCORRECTLY LABELED SPECIMEN 228   Cardiac Enzymes: No results for input(s): CKTOTAL, CKMB, CKMBINDEX, TROPONINI in the last 168 hours. BNP (last 3 results) No results for input(s): BNP in the last 8760 hours.  ProBNP (last 3 results) No results for input(s): PROBNP in the last 8760 hours.  CBG: No results  for input(s): GLUCAP in the last 168 hours.  Recent Results (from  the past 240 hour(s))  Blood culture (routine x 2)     Status: None (Preliminary result)   Collection Time: 07/04/15  8:20 PM  Result Value Ref Range Status   Specimen Description BLOOD LEFT WRIST  Final   Special Requests IN PEDIATRIC BOTTLE 1CC  Final   Culture   Final    NO GROWTH 2 DAYS Performed at [facility]    Report Status PENDING  Incomplete  Blood culture (routine x 2)     Status: None (Preliminary result)   Collection Time: 07/04/15  8:33 PM  Result Value Ref Range Status   Specimen Description BLOOD RIGHT FOOT  Final   Special Requests IN PEDIATRIC BOTTLE 2CC  Final   Culture   Final    NO GROWTH 2 DAYS Performed at [facility]    Report Status PENDING  Incomplete     Studies: No results found.  Scheduled Meds: . gabapentin  600 mg Oral TID  . metoCLOPramide (REGLAN) injection  5 mg Intravenous 4 times per day  . piperacillin-tazobactam (ZOSYN)  IV  3.375 g Intravenous Q8H  . sodium chloride  3 mL Intravenous Q12H  . vancomycin  1,000 mg Intravenous Q8H   Continuous Infusions: . sodium chloride 100 mL/hr at 07/07/15 1255   Antibiotics Given (last 72 hours)    Date/Time Action Medication Dose Rate   07/05/15 0005 Given   vancomycin (VANCOCIN) IVPB 1000 mg/200 mL premix 1,000 mg 200 mL/hr   07/05/15 0715 Given   piperacillin-tazobactam (ZOSYN) IVPB 3.375 g 3.375 g 12.5 mL/hr   07/05/15 0717 Given   vancomycin (VANCOCIN) IVPB 1000 mg/200 mL premix 1,000 mg 200 mL/hr   07/05/15 1548 Given   piperacillin-tazobactam (ZOSYN) IVPB 3.375 g 3.375 g 12.5 mL/hr   07/05/15 1549 Given   vancomycin (VANCOCIN) IVPB 1000 mg/200 mL premix 1,000 mg 200 mL/hr   07/05/15 2354 Given   vancomycin (VANCOCIN) IVPB 1000 mg/200 mL premix 1,000 mg 200 mL/hr   07/05/15 2354 Given   piperacillin-tazobactam (ZOSYN) IVPB 3.375 g 3.375 g 12.5 mL/hr   07/06/15 0757 Given   vancomycin (VANCOCIN) IVPB 1000 mg/200 mL premix 1,000 mg 200 mL/hr   07/06/15 0757 Given    piperacillin-tazobactam (ZOSYN) IVPB 3.375 g 3.375 g 12.5 mL/hr   07/06/15 1705 Given   vancomycin (VANCOCIN) IVPB 1000 mg/200 mL premix 1,000 mg 200 mL/hr   07/06/15 1705 Given   piperacillin-tazobactam (ZOSYN) IVPB 3.375 g 3.375 g 12.5 mL/hr   07/07/15 0715 Given   vancomycin (VANCOCIN) IVPB 1000 mg/200 mL premix 1,000 mg 200 mL/hr   07/07/15 0715 Given   piperacillin-tazobactam (ZOSYN) IVPB 3.375 g 3.375 g 12.5 mL/hr   07/07/15 1558 Given   vancomycin (VANCOCIN) IVPB 1000 mg/200 mL premix 1,000 mg 200 mL/hr   07/07/15 1558 Given   piperacillin-tazobactam (ZOSYN) IVPB 3.375 g 3.375 g 12.5 mL/hr      Active Problems:   Skin ulcer of upper arm, limited to breakdown of skin (HCC)   Wound infection (HCC)   Dehydration   Tachycardia   Diarrhea   Wound infection after surgery    Time spent:    [facility]  [facility] Pager [phone]. If 7PM-7AM, please contact night-coverage at [email or web address], password [facility] 07/07/2015, 4:58 PM  LOS: 3 days

## 2015-07-07 NOTE — Progress Notes (Signed)
Resumed care of patient. Agree with previous assessment. Will continue to monitor. 

## 2015-07-07 NOTE — Progress Notes (Signed)
  Subjective: The patient was seen yesterday and doing better.  Tolerating liquids and food.  Redness improved on the arm.  Still swollen as expected for surgery.  Objective: Vital signs in last 24 hours: Temp:  [97.8 F (36.6 C)-98.4 F (36.9 C)] 97.8 F (36.6 C) (10/25 0526) Pulse Rate:  [78-103] 78 (10/25 0526) Resp:  [16-20] 16 (10/25 0526) BP: (108-112)/(54-56) 112/56 mmHg (10/25 0526) SpO2:  [97 %-100 %] 100 % (10/25 0526) Last BM Date: 07/06/15  Intake/Output from previous day: 10/24 0701 - 10/25 0700 In: 3050 [I.V.:2300; IV Piggyback:750] Out: 600 [Urine:600] Intake/Output this shift:    General appearance: alert, cooperative and no distress  Lab Results:   Recent Labs  07/05/15 0544 07/06/15 1115  WBC 12.7* 4.8  HGB 9.1* 10.3*  HCT 29.1* 31.5*  PLT 344 228   BMET  Recent Labs  07/05/15 0544 07/06/15 0334  NA 133* 140  K 4.1 3.9  CL 103 106  CO2 25 27  GLUCOSE 112* 94  BUN 8 17  CREATININE 0.97 0.80  CALCIUM 8.7* 8.8*   PT/INR No results for input(s): LABPROT, INR in the last 72 hours. ABG No results for input(s): PHART, HCO3 in the last 72 hours.  Invalid input(s): PCO2, PO2  Studies/Results: No results found.  Anti-infectives: Anti-infectives    Start     Dose/Rate Route Frequency Ordered Stop   07/05/15 0800  vancomycin (VANCOCIN) IVPB 1000 mg/200 mL premix     1,000 mg 200 mL/hr over 60 Minutes Intravenous Every 8 hours 07/05/15 0009     07/05/15 0800  piperacillin-tazobactam (ZOSYN) IVPB 3.375 g     3.375 g 12.5 mL/hr over 240 Minutes Intravenous Every 8 hours 07/05/15 0010     07/04/15 2345  vancomycin (VANCOCIN) IVPB 1000 mg/200 mL premix     1,000 mg 200 mL/hr over 60 Minutes Intravenous  Once 07/04/15 2322 07/05/15 0105   07/04/15 2330  piperacillin-tazobactam (ZOSYN) IVPB 3.375 g  Status:  Discontinued     3.375 g 100 mL/hr over 30 Minutes Intravenous  Once 07/04/15 2321 07/06/15 1322   07/04/15 2015   piperacillin-tazobactam (ZOSYN) IVPB 3.375 g  Status:  Discontinued     3.375 g 12.5 mL/hr over 240 Minutes Intravenous  Once 07/04/15 2014 07/04/15 2321   07/04/15 2015  vancomycin (VANCOCIN) IVPB 1000 mg/200 mL premix  Status:  Discontinued     1,000 mg 200 mL/hr over 60 Minutes Intravenous  Once 07/04/15 2014 07/04/15 2322      Assessment/Plan: s/p * No surgery found * Likely ready for discharge with antibiotics and follow up next week.  LOS: 3 days    CLAIRE S DILLINGHAM 07/07/2015  

## 2015-07-08 ENCOUNTER — Other Ambulatory Visit: Payer: Self-pay | Admitting: Plastic Surgery

## 2015-07-08 ENCOUNTER — Inpatient Hospital Stay (HOSPITAL_COMMUNITY): Payer: MEDICAID | Admitting: Certified Registered Nurse Anesthetist

## 2015-07-08 ENCOUNTER — Encounter (HOSPITAL_COMMUNITY): Payer: Self-pay | Admitting: Anesthesiology

## 2015-07-08 ENCOUNTER — Encounter (HOSPITAL_COMMUNITY)
Admission: EM | Disposition: A | Discharge status: Home Health | Payer: Self-pay | Source: Home / Self Care | Attending: Internal Medicine

## 2015-07-08 ENCOUNTER — Inpatient Hospital Stay (HOSPITAL_COMMUNITY): Payer: Self-pay | Admitting: Certified Registered Nurse Anesthetist

## 2015-07-08 DIAGNOSIS — S41102A Unspecified open wound of left upper arm, initial encounter: Secondary | ICD-10-CM

## 2015-07-08 HISTORY — PX: I & D EXTREMITY: SHX5045

## 2015-07-08 LAB — BASIC METABOLIC PANEL
Anion gap: 5 (ref 5–15)
BUN: 10 mg/dL (ref 6–20)
CO2: 28 mmol/L (ref 22–32)
CREATININE: 0.73 mg/dL (ref 0.44–1.00)
Calcium: 8.9 mg/dL (ref 8.9–10.3)
Chloride: 107 mmol/L (ref 101–111)
GFR calc non Af Amer: >60 mL/min (ref >60)
GLUCOSE: 91 mg/dL (ref 65–99)
Potassium: 3.7 mmol/L (ref 3.5–5.1)
Sodium: 140 mmol/L (ref 135–145)

## 2015-07-08 SURGERY — IRRIGATION AND DEBRIDEMENT EXTREMITY
Anesthesia: General | Site: Elbow | Laterality: Left

## 2015-07-08 MED ORDER — SODIUM CHLORIDE 0.9 % IR SOLN
Status: DC | PRN
  Administered 2015-07-08: 500 mL

## 2015-07-08 MED ORDER — PROMETHAZINE HCL 25 MG/ML IJ SOLN
INTRAMUSCULAR | Status: AC
  Filled 2015-07-08: qty 1

## 2015-07-08 MED ORDER — OXYCODONE HCL 5 MG/5ML PO SOLN: 5.0000 mg | Freq: Once | ORAL | Status: DC | PRN

## 2015-07-08 MED ORDER — SODIUM CHLORIDE 0.9 % IJ SOLN
INTRAMUSCULAR | Status: AC
  Filled 2015-07-08: qty 10

## 2015-07-08 MED ORDER — FENTANYL CITRATE (PF) 100 MCG/2ML IJ SOLN
INTRAMUSCULAR | Status: DC | PRN
  Administered 2015-07-08 (×2): 50 ug via INTRAVENOUS

## 2015-07-08 MED ORDER — PROPOFOL 10 MG/ML IV BOLUS
INTRAVENOUS | Status: AC
  Filled 2015-07-08: qty 20

## 2015-07-08 MED ORDER — LIDOCAINE HCL (CARDIAC) 20 MG/ML IV SOLN
INTRAVENOUS | Status: DC | PRN
  Administered 2015-07-08 (×2): 50 mg via INTRAVENOUS

## 2015-07-08 MED ORDER — FENTANYL CITRATE (PF) 100 MCG/2ML IJ SOLN
25.0000 ug | INTRAMUSCULAR | Status: DC | PRN
  Administered 2015-07-08 (×3): 50 ug via INTRAVENOUS

## 2015-07-08 MED ORDER — DEXAMETHASONE SODIUM PHOSPHATE 10 MG/ML IJ SOLN
INTRAMUSCULAR | Status: DC | PRN
  Administered 2015-07-08: 10 mg via INTRAVENOUS

## 2015-07-08 MED ORDER — PROMETHAZINE HCL 25 MG/ML IJ SOLN
6.2500 mg | INTRAMUSCULAR | Status: AC | PRN
  Administered 2015-07-08 (×2): 12.5 mg via INTRAVENOUS

## 2015-07-08 MED ORDER — PHENYLEPHRINE HCL 10 MG/ML IJ SOLN
INTRAMUSCULAR | Status: DC | PRN
  Administered 2015-07-08: 80 ug via INTRAVENOUS
  Administered 2015-07-08 (×2): 40 ug via INTRAVENOUS

## 2015-07-08 MED ORDER — PROPOFOL 10 MG/ML IV BOLUS
INTRAVENOUS | Status: DC | PRN
  Administered 2015-07-08: 50 mg via INTRAVENOUS
  Administered 2015-07-08: 150 mg via INTRAVENOUS

## 2015-07-08 MED ORDER — MIDAZOLAM HCL 5 MG/5ML IJ SOLN
INTRAMUSCULAR | Status: DC | PRN
  Administered 2015-07-08: 2 mg via INTRAVENOUS

## 2015-07-08 MED ORDER — TISSEEL VH 10 ML EX KIT
PACK | CUTANEOUS | Status: AC
  Filled 2015-07-08: qty 1

## 2015-07-08 MED ORDER — OXYCODONE HCL 5 MG PO TABS: 5.0000 mg | ORAL_TABLET | Freq: Once | ORAL | Status: DC | PRN

## 2015-07-08 MED ORDER — FENTANYL CITRATE (PF) 100 MCG/2ML IJ SOLN
INTRAMUSCULAR | Status: AC
  Filled 2015-07-08: qty 4

## 2015-07-08 MED ORDER — SODIUM CHLORIDE 0.9 % IR SOLN
Status: AC
  Filled 2015-07-08: qty 1

## 2015-07-08 MED ORDER — MIDAZOLAM HCL 2 MG/2ML IJ SOLN
INTRAMUSCULAR | Status: AC
  Filled 2015-07-08: qty 4

## 2015-07-08 MED ORDER — BUPIVACAINE-EPINEPHRINE 0.25% -1:200000 IJ SOLN
INTRAMUSCULAR | Status: AC
  Filled 2015-07-08: qty 1

## 2015-07-08 MED ORDER — FENTANYL CITRATE (PF) 100 MCG/2ML IJ SOLN
INTRAMUSCULAR | Status: AC
  Administered 2015-07-08: 19:00:00
  Filled 2015-07-08: qty 2

## 2015-07-08 MED ORDER — LACTATED RINGERS IV SOLN
INTRAVENOUS | Status: DC
  Administered 2015-07-08: 1000 mL via INTRAVENOUS

## 2015-07-08 MED ORDER — FENTANYL CITRATE (PF) 100 MCG/2ML IJ SOLN
INTRAMUSCULAR | Status: AC
  Filled 2015-07-08: qty 2

## 2015-07-08 SURGICAL SUPPLY — 48 items
BAG ZIPLOCK 12X15 (MISCELLANEOUS) ×2 IMPLANT
BANDAGE ELASTIC 4 VELCRO ST LF (GAUZE/BANDAGES/DRESSINGS) ×2 IMPLANT
BLADE HEX COATED 2.75 (ELECTRODE) ×2 IMPLANT
BNDG GAUZE ELAST 4 BULKY (GAUZE/BANDAGES/DRESSINGS) ×2 IMPLANT
CORDS BIPOLAR (ELECTRODE) ×2 IMPLANT
COVER SURGICAL LIGHT HANDLE (MISCELLANEOUS) ×2 IMPLANT
CUFF TOURN SGL QUICK 18 (TOURNIQUET CUFF) ×2 IMPLANT
CUFF TOURN SGL QUICK 24 (TOURNIQUET CUFF)
CUFF TRNQT CYL 24X4X40X1 (TOURNIQUET CUFF) IMPLANT
DRAIN CHANNEL 19F RND (DRAIN) IMPLANT
DRAPE SURG 17X11 SM STRL (DRAPES) ×2 IMPLANT
DRSG EMULSION OIL 3X3 NADH (GAUZE/BANDAGES/DRESSINGS) ×2 IMPLANT
DRSG MEPILEX BORDER 4X4 (GAUZE/BANDAGES/DRESSINGS) ×2 IMPLANT
DRSG PAD ABDOMINAL 8X10 ST (GAUZE/BANDAGES/DRESSINGS) IMPLANT
DRSG VAC ATS SM SENSATRAC (GAUZE/BANDAGES/DRESSINGS) ×2 IMPLANT
DURAPREP 26ML APPLICATOR (WOUND CARE) ×2 IMPLANT
ELECT REM PT RETURN 9FT ADLT (ELECTROSURGICAL) ×2
ELECTRODE REM PT RTRN 9FT ADLT (ELECTROSURGICAL) ×1 IMPLANT
EVACUATOR SILICONE 100CC (DRAIN) IMPLANT
GAUZE SPONGE 4X4 12PLY STRL (GAUZE/BANDAGES/DRESSINGS) ×2 IMPLANT
GLOVE BIO SURGEON STRL SZ 6.5 (GLOVE) ×2 IMPLANT
GLOVE SURG ORTHO 8.0 STRL STRW (GLOVE) ×4 IMPLANT
HANDPIECE INTERPULSE COAX TIP (DISPOSABLE)
KIT BASIN OR (CUSTOM PROCEDURE TRAY) ×2 IMPLANT
MANIFOLD NEPTUNE II (INSTRUMENTS) ×2 IMPLANT
MATRIX SURGICAL PSMX 7X10CM (Tissue) ×2 IMPLANT
MICROMATRIX 1000MG (Tissue) ×2 IMPLANT
NEEDLE HYPO 22GX1.5 SAFETY (NEEDLE) ×2 IMPLANT
NEEDLE HYPO 25X1 1.5 SAFETY (NEEDLE) ×2 IMPLANT
NS IRRIG 1000ML POUR BTL (IV SOLUTION) ×2 IMPLANT
PACK ORTHO EXTREMITY (CUSTOM PROCEDURE TRAY) ×2 IMPLANT
PAD CAST 3X4 CTTN HI CHSV (CAST SUPPLIES) ×1 IMPLANT
PAD CAST 4YDX4 CTTN HI CHSV (CAST SUPPLIES) ×1 IMPLANT
PADDING CAST ABS 4INX4YD NS (CAST SUPPLIES) ×2
PADDING CAST ABS COTTON 4X4 ST (CAST SUPPLIES) ×2 IMPLANT
PADDING CAST COTTON 3X4 STRL (CAST SUPPLIES) ×1
PADDING CAST COTTON 4X4 STRL (CAST SUPPLIES) ×1
SET HNDPC FAN SPRY TIP SCT (DISPOSABLE) IMPLANT
SOL PREP POV-IOD 4OZ 10% (MISCELLANEOUS) ×2 IMPLANT
SOLUTION PARTIC MCRMTRX 1000MG (Tissue) ×1 IMPLANT
SPLINT FIBERGLASS 4X15 (CAST SUPPLIES) IMPLANT
STAPLER VISISTAT 35W (STAPLE) ×2 IMPLANT
SUT SILK 0 FSL (SUTURE) ×2 IMPLANT
SUT VIC AB 5-0 PS2 18 (SUTURE) IMPLANT
SWAB COLLECTION DEVICE MRSA (MISCELLANEOUS) ×2 IMPLANT
SYR CONTROL 10ML LL (SYRINGE) ×2 IMPLANT
TUBE ANAEROBIC SPECIMEN COL (MISCELLANEOUS) ×2 IMPLANT
WATER STERILE IRR 1500ML POUR (IV SOLUTION) IMPLANT

## 2015-07-08 NOTE — Care Management Note (Signed)
Case Management Note  Patient Details  Name: [name] Mahn MRN: [id number] Date of Birth: 10/19/1965  Subjective/Objective: [facility] [place] Kristen aware. Await HHRN order. Artistinancial counselor following.                   Action/Plan:d/c plan home.   Expected Discharge Date:                  Expected Discharge Plan:  Home w Home Health Services  In-House Referral:     Discharge planning Services  CM Consult, Medication Assistance  Post Acute Care Choice:    Choice offered to:     DME Arranged:    DME Agency:     HH Arranged:    HH Agency:  [facility]  Status of Service:  In process, will continue to follow  Medicare Important Message Given:    Date Medicare IM Given:    Medicare IM give by:    Date Additional Medicare IM Given:    Additional Medicare Important Message give by:     If discussed at Long Length of Stay Meetings, dates discussed:    Additional Comments:  Yepez ClamMahabir, Cristel Rail, RN 07/08/2015, 1:12 PM

## 2015-07-08 NOTE — Transfer of Care (Signed)
Immediate Anesthesia Transfer of Care Note  Patient: Sabrina LabradorDana [name]  Procedure(s) Performed: Procedure(s): IRRIGATION AND DEBRIDEMENT EXTREMITY, DRAINAGE OF LEFT ARM WOUND, A-CELL PLACEMENT, WOUND VAC PLACEMENT (Left)  Patient Location: PACU  Anesthesia Type:General  Level of Consciousness: awake, alert  and oriented  Airway & Oxygen Therapy: Patient Spontanous Breathing and Patient connected to face mask oxygen  Post-op Assessment: Report given to RN and Post -op Vital signs reviewed and stable  Post vital signs: Reviewed and stable  Last Vitals:  Filed Vitals:   07/08/15 1445  BP: 119/59  Pulse: 80  Temp: 37 C  Resp: 20    Complications: No apparent anesthesia complications

## 2015-07-08 NOTE — H&P (View-Only) (Signed)
Sabrina [name] is an 49 y.o. female.   Chief Complaint: left arm wound HPI: The patient is a 49 yrs old wf here for treatment of her right arm wound.  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  She was operated on several times, had osteo of the site and was treated with antibiotics. She was treated with a STSG.  This occurred in [place]. The area has continued to break down and she presents for treatment.  Past Medical History  Diagnosis Date  . Bullous pemphigus   . Staph infection   . Cat bite   . 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 Situational anxiety   . PONV (postoperative nausea and vomiting)     Past Surgical History  Procedure Laterality Date  . Abdominal hysterectomy    . Appendectomy    . Skin graft    . 22 surgeries on left elbow      Family History  Problem Relation Age of Onset  . Liver disease Mother   . Dementia Mother   . Cancer Mother   . Cancer Maternal Grandmother    Social History:  reports that she has never smoked. She does not have any smokeless tobacco history on file. She reports that she does not drink alcohol or use illicit drugs.  Allergies:  Allergies  Allergen Reactions  . Morphine And Related Itching  . Zofran [Ondansetron Hcl]     Spots around iv site  . Droperidol Palpitations    Medications Prior to Admission  Medication Sig Dispense Refill  . gabapentin (NEURONTIN) 300 MG capsule Take 2 capsules (600 mg total) by mouth 3 (three) times daily. 180 capsule 3  . ibuprofen (ADVIL,MOTRIN) 200 MG tablet Take 200 mg by mouth every 6 (six) hours as needed for mild pain.    . traMADol (ULTRAM) 50 MG tablet Take 1 tablet (50 mg total) by mouth every 8 (eight) hours as needed. 30 tablet 0    No results found for this or any previous visit (from the past 48 hour(s)). No results found.  Review of Systems  Constitutional: Negative.   HENT: Negative.   Eyes: Negative.   Respiratory: Negative.     Cardiovascular: Negative.   Gastrointestinal: Negative.   Genitourinary: Negative.   Musculoskeletal: Negative.   Skin: Negative.   Neurological: Negative.   Psychiatric/Behavioral: Negative.     Blood pressure 121/81, pulse 83, temperature 98.2 F (36.8 C), temperature source Oral, resp. rate 18, height 5' 4" (1.626 m), weight 86.183 kg (190 lb), SpO2 100 %. Physical Exam  Constitutional: She is oriented to person, place, and time. She appears well-developed and well-nourished.  HENT:  Head: Normocephalic and atraumatic.  Eyes: Conjunctivae and EOM are normal. Pupils are equal, round, and reactive to light.  Respiratory: Effort normal.  GI: Soft.  Musculoskeletal:       Arms: Neurological: She is alert and oriented to person, place, and time.  Skin:  Left arm wound  Psychiatric: She has a normal mood and affect. Her behavior is normal. Judgment and thought content normal.     Assessment/Plan Plan for excision of wound and primary closure of left arm wound.  Sabrina [name] S Sabrina [name] 07/01/2015, 8:52 AM    

## 2015-07-08 NOTE — Anesthesia Procedure Notes (Signed)
Procedure Name: LMA Insertion Date/Time: 07/08/2015 5:14 PM Performed by: Thornell MuleSTUBBLEFIELD, Saniyyah Elster G Pre-anesthesia Checklist: Patient identified, Emergency Drugs available, Suction available and Patient being monitored Patient Re-evaluated:Patient Re-evaluated prior to inductionOxygen Delivery Method: Circle system utilized Preoxygenation: Pre-oxygenation with 100% oxygen Intubation Type: IV induction LMA: LMA inserted LMA Size: 4.0 Number of attempts: 1 Placement Confirmation: positive ETCO2 Tube secured with: Tape

## 2015-07-08 NOTE — Anesthesia Preprocedure Evaluation (Addendum)
Anesthesia Evaluation  Patient identified by MRN, date of birth, ID band Patient awake    Reviewed: Allergy & Precautions, NPO status , Patient's Chart, lab work & pertinent test results  History of Anesthesia Complications (+) PONV and history of anesthetic complications  Airway Mallampati: II   Neck ROM: full    Dental   Pulmonary neg pulmonary ROS,    breath sounds clear to auscultation       Cardiovascular negative cardio ROS   Rhythm:regular Rate:Normal     Neuro/Psych PSYCHIATRIC DISORDERS Anxiety    GI/Hepatic   Endo/Other  obese  Renal/GU      Musculoskeletal   Abdominal   Peds  Hematology   Anesthesia Other Findings   Reproductive/Obstetrics                             Anesthesia Physical Anesthesia Plan  ASA: II  Anesthesia Plan: General   Post-op Pain Management:    Induction: Intravenous  Airway Management Planned: LMA  Additional Equipment:   Intra-op Plan:   Post-operative Plan:   Informed Consent: I have reviewed the patients History and Physical, chart, labs and discussed the procedure including the risks, benefits and alternatives for the proposed anesthesia with the patient or authorized representative who has indicated his/her understanding and acceptance.     Plan Discussed with: CRNA, Anesthesiologist and Surgeon  Anesthesia Plan Comments:         Anesthesia Quick Evaluation

## 2015-07-08 NOTE — Interval H&P Note (Signed)
History and Physical Interval Note:  07/08/2015 4:40 PM  Sabrina [name]  has presented today for surgery, with the diagnosis of left arm seroma  The various methods of treatment have been discussed with the patient and family. After consideration of risks, benefits and other options for treatment, the patient has consented to  Procedure(s): IRRIGATION AND DEBRIDEMENT EXTREMITY, DRAINAGE OF LEFT ARM SEROMA, POSSIBLE A-CELL PLACEMENT, POSSIBLE WOUND VAC (Left) as a surgical intervention .  The patient's history has been reviewed, patient examined, no change in status, stable for surgery.  I have reviewed the patient's chart and labs.  Questions were answered to the patient's satisfaction.     Peggye FormLAIRE S DILLINGHAM

## 2015-07-08 NOTE — Care Management Note (Signed)
Case Management Note  Patient Details  Name: Sabrina [name] MRN: [id number] Date of Birth: 07/28/1966  Subjective/Objective: Noted for L elbow surgery today, wound vac.  Cons for home wound vac. Will need WOC cons post sx.[employer] rep Rickie aware of wound vac. Will need forms completed for home wound vac-will put in shadow chart once sx done.  Recommend HHRN-wound care, vac instruction.  Patient will need patient asst-no insurance.                  Action/Plan:d/c plan home.   Expected Discharge Date:                  Expected Discharge Plan:  Home/Self Care  In-House Referral:     Discharge planning Services  CM Consult, Medication Assistance  Post Acute Care Choice:    Choice offered to:     DME Arranged:    DME Agency:     HH Arranged:    HH Agency:     Status of Service:  In process, will continue to follow  Medicare Important Message Given:    Date Medicare IM Given:    Medicare IM give by:    Date Additional Medicare IM Given:    Additional Medicare Important Message give by:     If discussed at Long Length of Stay Meetings, dates discussed:    Additional Comments:  Furlough ClamMahabir, Aislee Landgren, RN 07/08/2015, 10:27 AM

## 2015-07-08 NOTE — Progress Notes (Signed)
[facility] PROGRESS NOTE  Khristi Schiller XLK:[id number] DOB: 05/24/1966 DOA: 07/04/2015 PCP: [name], NP  HPI/Brief narrative 49/F with PMH of R arm wound following cat bite 1year back and multiple surgeries since, recent Arm surgery on 10/19 admitted with worsening swelling and pain around the surgical site. Plastics Dr.Dillingham consulting  Assessment/Plan: 1. Wound Infection/chronic Elbow infection -s/p Excision of left arm wound with primary layered closure 6 x 18 cm on 10/19 -drain with bloody discharge -cultures negative, WBC normalized , no fevers -Continued on IVF, Broad spectrum Abx-Vanc and Zosyn -Patient reports worse pain this AM with continued swelling -Dr.Dillingham following, plans for wound vac placement today  2. Sepsis secondary to L arm wound infection -due to #1 -resolved  3. DVT prophylaxis - SCD's  Code Status: Full Family Communication: Pt in room Disposition Plan: Pending   Consultants:  Plastic Surgery  Procedures:    Antibiotics: Anti-infectives    Start     Dose/Rate Route Frequency Ordered Stop   07/07/15 0000  amoxicillin-clavulanate (AUGMENTIN) 500-125 MG tablet  Status:  Discontinued     1 tablet Oral 3 times daily 07/07/15 1333 07/07/15    07/07/15 0000  doxycycline (VIBRA-TABS) 100 MG tablet  Status:  Discontinued     100 mg Oral 2 times daily after meals 07/07/15 1333 07/07/15    07/07/15 0000  amoxicillin-clavulanate (AUGMENTIN) 500-125 MG tablet     1 tablet Oral 3 times daily 07/07/15 1657     07/07/15 0000  doxycycline (VIBRA-TABS) 100 MG tablet     100 mg Oral 2 times daily after meals 07/07/15 1657     07/05/15 0800  vancomycin (VANCOCIN) IVPB 1000 mg/200 mL premix     1,000 mg 200 mL/hr over 60 Minutes Intravenous Every 8 hours 07/05/15 0009     07/05/15 0800  piperacillin-tazobactam (ZOSYN) IVPB 3.375 g     3.375 g 12.5 mL/hr over 240 Minutes Intravenous Every 8 hours 07/05/15 0010     07/04/15 2345   vancomycin (VANCOCIN) IVPB 1000 mg/200 mL premix     1,000 mg 200 mL/hr over 60 Minutes Intravenous  Once 07/04/15 2322 07/05/15 0105   07/04/15 2330  piperacillin-tazobactam (ZOSYN) IVPB 3.375 g  Status:  Discontinued     3.375 g 100 mL/hr over 30 Minutes Intravenous  Once 07/04/15 2321 07/06/15 1322   07/04/15 2015  piperacillin-tazobactam (ZOSYN) IVPB 3.375 g  Status:  Discontinued     3.375 g 12.5 mL/hr over 240 Minutes Intravenous  Once 07/04/15 2014 07/04/15 2321   07/04/15 2015  vancomycin (VANCOCIN) IVPB 1000 mg/200 mL premix  Status:  Discontinued     1,000 mg 200 mL/hr over 60 Minutes Intravenous  Once 07/04/15 2014 07/04/15 2322       HPI/Subjective: Continues to complain of L arm pain  Objective: Filed Vitals:   07/07/15 0526 07/07/15 1334 07/07/15 2239 07/08/15 0514  BP: 112/56 109/66 124/65 110/56  Pulse: 78 96 80 70  Temp: 97.8 F (36.6 C) 98.1 F (36.7 C) 98.3 F (36.8 C) 97.8 F (36.6 C)  TempSrc: Oral Oral Oral Oral  Resp: Height:      Weight:      SpO2: 100% 100% 100% 98%    Intake/Output Summary (Last 24 hours) at 07/08/15 1352 Last data filed at 07/08/15 0700  Gross per 24 hour  Intake 3252.5 ml  Output    250 ml  Net 3002.5 ml   Filed Weights   07/04/15 2349  Weight: 88.2 kg (194 lb 7.1 oz)    Exam:   General:  Awake, in nad  Cardiovascular: regular, s1, s2  Respiratory: normal resp effort, on wheezing  Abdomen: soft,nondistended  Musculoskeletal: perfused, no clubbing, drain in place over L forearm, swollen   Data Reviewed: Basic Metabolic Panel:  Recent Labs Lab 07/04/15 1850 07/05/15 0544 07/06/15 0334 07/08/15 1030  NA 139 QUESTIONABLE IDENTIFICATION / INCORRECTLY LABELED SPECIMEN 140 140  K 4.0 QUESTIONABLE IDENTIFICATION / INCORRECTLY LABELED SPECIMEN 3.9 3.7  CL 106 QUESTIONABLE IDENTIFICATION / INCORRECTLY LABELED SPECIMEN 106 107  CO2 24 QUESTIONABLE IDENTIFICATION / INCORRECTLY LABELED SPECIMEN 27 28   GLUCOSE 98 QUESTIONABLE IDENTIFICATION / INCORRECTLY LABELED SPECIMEN 94 91  BUN 21* QUESTIONABLE IDENTIFICATION / INCORRECTLY LABELED SPECIMEN 17 10  CREATININE 0.84 QUESTIONABLE IDENTIFICATION / INCORRECTLY LABELED SPECIMEN 0.80 0.73  CALCIUM 9.2 QUESTIONABLE IDENTIFICATION / INCORRECTLY LABELED SPECIMEN 8.8* 8.9  MG  --  QUESTIONABLE IDENTIFICATION / INCORRECTLY LABELED SPECIMEN  --   --   PHOS  --  QUESTIONABLE IDENTIFICATION / INCORRECTLY LABELED SPECIMEN  --   --    Liver Function Tests:  Recent Labs Lab 07/04/15 1850 07/05/15 0544  AST 21 QUESTIONABLE IDENTIFICATION / INCORRECTLY LABELED SPECIMEN  ALT 17 QUESTIONABLE IDENTIFICATION / INCORRECTLY LABELED SPECIMEN  ALKPHOS 97 QUESTIONABLE IDENTIFICATION / INCORRECTLY LABELED SPECIMEN  BILITOT 0.6 QUESTIONABLE IDENTIFICATION / INCORRECTLY LABELED SPECIMEN  PROT 7.5 QUESTIONABLE IDENTIFICATION / INCORRECTLY LABELED SPECIMEN  ALBUMIN 4.1 QUESTIONABLE IDENTIFICATION / INCORRECTLY LABELED SPECIMEN    Recent Labs Lab 07/04/15 1850  LIPASE 27   No results for input(s): AMMONIA in the last 168 hours. CBC:  Recent Labs Lab 07/04/15 1850 07/05/15 0544 07/06/15 1115  WBC 8.4 QUESTIONABLE IDENTIFICATION / INCORRECTLY LABELED SPECIMEN 4.8  HGB 12.4 QUESTIONABLE IDENTIFICATION / INCORRECTLY LABELED SPECIMEN 10.3*  HCT 37.3 QUESTIONABLE IDENTIFICATION / INCORRECTLY LABELED SPECIMEN 31.5*  MCV 93.3 QUESTIONABLE IDENTIFICATION / INCORRECTLY LABELED SPECIMEN 96.3  PLT 255 QUESTIONABLE IDENTIFICATION / INCORRECTLY LABELED SPECIMEN 228   Cardiac Enzymes: No results for input(s): CKTOTAL, CKMB, CKMBINDEX, TROPONINI in the last 168 hours. BNP (last 3 results) No results for input(s): BNP in the last 8760 hours.  ProBNP (last 3 results) No results for input(s): PROBNP in the last 8760 hours.  CBG: No results for input(s): GLUCAP in the last 168 hours.  Recent Results (from the past 240 hour(s))  Blood culture (routine x 2)      Status: None (Preliminary result)   Collection Time: 07/04/15  8:20 PM  Result Value Ref Range Status   Specimen Description BLOOD LEFT WRIST  Final   Special Requests IN PEDIATRIC BOTTLE 1CC  Final   Culture   Final    NO GROWTH 3 DAYS [facility]    Report Status PENDING  Incomplete  Blood culture (routine x 2)     Status: None (Preliminary result)   Collection Time: 07/04/15  8:33 PM  Result Value Ref Range Status   Specimen Description BLOOD RIGHT FOOT  Final   Special Requests IN PEDIATRIC BOTTLE 2CC  Final   Culture   Final    NO GROWTH 3 DAYS [facility]    Report Status PENDING  Incomplete     Studies: No results found.  Scheduled Meds: . gabapentin  600 mg Oral TID  . metoCLOPramide (REGLAN) injection  5 mg Intravenous 4 times per day  . piperacillin-tazobactam (ZOSYN)  IV  3.375 g Intravenous Q8H  . sodium chloride  3 mL  Intravenous Q12H  . vancomycin  1,000 mg Intravenous Q8H   Continuous Infusions: . sodium chloride 125 mL/hr at 07/08/15 1117    Active Problems:   Skin ulcer of upper arm, limited to breakdown of skin (HCC)   Wound infection (HCC)   Dehydration   Tachycardia   Diarrhea   Wound infection after surgery   Kaeley Vinje K  [facility] Pager [phone]. If 7PM-7AM, please contact night-coverage at [email or web address], password [facility] 07/08/2015, 1:52 PM  LOS: 4 days

## 2015-07-08 NOTE — Interval H&P Note (Signed)
History and Physical Interval Note:  07/08/2015 11:28 AM  Sabrina [name]  has presented today for surgery, with the diagnosis of left arm seroma  The various methods of treatment have been discussed with the patient and family. After consideration of risks, benefits and other options for treatment, the patient has consented to  Procedure(s): IRRIGATION AND DEBRIDEMENT EXTREMITY, DRAINAGE OF LEFT ARM SEROMA, POSSIBLE A-CELL PLACEMENT, POSSIBLE WOUND VAC (Left) as a surgical intervention .  The patient's history has been reviewed, patient examined, no change in status, stable for surgery.  I have reviewed the patient's chart and labs.  Questions were answered to the patient's satisfaction.     Sabrina [name]

## 2015-07-08 NOTE — Op Note (Signed)
Operative Note   DATE OF OPERATION: 07/08/2015  LOCATION[facility]  SURGICAL DIVISION: Plastic Surgery  PREOPERATIVE DIAGNOSES:  Left arm wound with seroma  POSTOPERATIVE DIAGNOSES:  same  PROCEDURE:  Left arm preparation for Acell (7 x 10 and 1 gm powder) and VAC placement after open incision, drainage of seroma wound 2 x 7 x 1 cm  SURGEON: Tribune CompanyClaire Sanger, DO  ASSISTANT: Shawn Rayburn, PA  ANESTHESIA:  General.   COMPLICATIONS: None.   INDICATIONS FOR PROCEDURE:  The patient, Sabrina [name] is a 49 y.o. female born on 01/17/1966, is here for treatment of a left arm seroma/wound after a cat bite to the arm. MRN: [id number]  CONSENT:  Informed consent was obtained directly from the patient. Risks, benefits and alternatives were fully discussed. Specific risks including but not limited to bleeding, infection, hematoma, seroma, scarring, pain, infection, contracture, asymmetry, wound healing problems, and need for further surgery were all discussed. The patient did have an ample opportunity to have questions answered to satisfaction.   DESCRIPTION OF PROCEDURE:  The patient was taken to the operating room. SCDs were placed and IV antibiotics were given. The patient's operative site was prepped and draped in a sterile fashion. A time out was performed and all information was confirmed to be correct.  General anesthesia was administered.  The distal incision was opened and gram stains and cultures obtained.  Antibiotic solution was used to irrigate the wound. Hemostasis was achieved with electrocautery.  The Acell powder (1 gm) and sheet (3 x 7 cm) were applied and secured with 4-0 Monocryl.  Retention sutures were placed to keep the wound edges near.  The adaptic was applied and the VAC.  There was an excellent seal on the VAC. The patient tolerated the procedure well.  There were no complications. The patient was allowed to wake from anesthesia, extubated and taken to the  recovery room in satisfactory condition.

## 2015-07-08 NOTE — Anesthesia Postprocedure Evaluation (Signed)
Anesthesia Post Note  Patient: Sabrina LabradorDana [name]  Procedure(s) Performed: Procedure(s) (LRB): IRRIGATION AND DEBRIDEMENT EXTREMITY, DRAINAGE OF LEFT ARM WOUND, A-CELL PLACEMENT, WOUND VAC PLACEMENT (Left)  Anesthesia type: General  Patient location: PACU  Post pain: Pain level controlled and Adequate analgesia  Post assessment: Post-op Vital signs reviewed, Patient's Cardiovascular Status Stable, Respiratory Function Stable, Patent Airway and Pain level controlled  Last Vitals:  Filed Vitals:   07/08/15 1900  BP: 134/75  Pulse: 82  Temp:   Resp: 20    Post vital signs: Reviewed and stable  Level of consciousness: awake, alert  and oriented  Complications: No apparent anesthesia complications

## 2015-07-08 NOTE — H&P (View-Only) (Signed)
  Subjective: The patient was seen yesterday and doing better.  Tolerating liquids and food.  Redness improved on the arm.  Still swollen as expected for surgery.  Objective: Vital signs in last 24 hours: Temp:  [97.8 F (36.6 C)-98.4 F (36.9 C)] 97.8 F (36.6 C) (10/25 0526) Pulse Rate:  [78-103] 78 (10/25 0526) Resp:  [16-20] 16 (10/25 0526) BP: (108-112)/(54-56) 112/56 mmHg (10/25 0526) SpO2:  [97 %-100 %] 100 % (10/25 0526) Last BM Date: 07/06/15  Intake/Output from previous day: 10/24 0701 - 10/25 0700 In: 3050 [I.V.:2300; IV Piggyback:750] Out: 600 [Urine:600] Intake/Output this shift:    General appearance: alert, cooperative and no distress  Lab Results:   Recent Labs  07/05/15 0544 07/06/15 1115  WBC 12.7* 4.8  HGB 9.1* 10.3*  HCT 29.1* 31.5*  PLT 344 228   BMET  Recent Labs  07/05/15 0544 07/06/15 0334  NA 133* 140  K 4.1 3.9  CL 103 106  CO2 25 27  GLUCOSE 112* 94  BUN 8 17  CREATININE 0.97 0.80  CALCIUM 8.7* 8.8*   PT/INR No results for input(s): LABPROT, INR in the last 72 hours. ABG No results for input(s): PHART, HCO3 in the last 72 hours.  Invalid input(s): PCO2, PO2  Studies/Results: No results found.  Anti-infectives: Anti-infectives    Start     Dose/Rate Route Frequency Ordered Stop   07/05/15 0800  vancomycin (VANCOCIN) IVPB 1000 mg/200 mL premix     1,000 mg 200 mL/hr over 60 Minutes Intravenous Every 8 hours 07/05/15 0009     07/05/15 0800  piperacillin-tazobactam (ZOSYN) IVPB 3.375 g     3.375 g 12.5 mL/hr over 240 Minutes Intravenous Every 8 hours 07/05/15 0010     07/04/15 2345  vancomycin (VANCOCIN) IVPB 1000 mg/200 mL premix     1,000 mg 200 mL/hr over 60 Minutes Intravenous  Once 07/04/15 2322 07/05/15 0105   07/04/15 2330  piperacillin-tazobactam (ZOSYN) IVPB 3.375 g  Status:  Discontinued     3.375 g 100 mL/hr over 30 Minutes Intravenous  Once 07/04/15 2321 07/06/15 1322   07/04/15 2015   piperacillin-tazobactam (ZOSYN) IVPB 3.375 g  Status:  Discontinued     3.375 g 12.5 mL/hr over 240 Minutes Intravenous  Once 07/04/15 2014 07/04/15 2321   07/04/15 2015  vancomycin (VANCOCIN) IVPB 1000 mg/200 mL premix  Status:  Discontinued     1,000 mg 200 mL/hr over 60 Minutes Intravenous  Once 07/04/15 2014 07/04/15 2322      Assessment/Plan: s/p * No surgery found * Likely ready for discharge with antibiotics and follow up next week.  LOS: 3 days    Peggye FormCLAIRE S DILLINGHAM 07/07/2015

## 2015-07-08 NOTE — Care Management Note (Signed)
Case Management Note  Patient Details  Name: Sabrina [name] MRN: [id number] Date of Birth: 07/11/1966  Subjective/Objective:  Received referral for home wound vac. [employer] rep Sabrina [name] notified-following for home wound vac, faxed w/confirmation-face sheet,h&p, 1st op note, & progress notes #[phone].                  Action/Plan:d/c home.   Expected Discharge Date:                  Expected Discharge Plan:  Home/Self Care  In-House Referral:     Discharge planning Services  CM Consult, Medication Assistance  Post Acute Care Choice:    Choice offered to:     DME Arranged:    DME Agency:     HH Arranged:    HH Agency:     Status of Service:  In process, will continue to follow  Medicare Important Message Given:    Date Medicare IM Given:    Medicare IM give by:    Date Additional Medicare IM Given:    Additional Medicare Important Message give by:     If discussed at Long Length of Stay Meetings, dates discussed:    Additional Comments:  Scalera ClamMahabir, Wildon Cuevas, RN 07/08/2015, 11:50 AM

## 2015-07-08 NOTE — Brief Op Note (Signed)
07/04/2015 - 07/08/2015  6:06 PM  PATIENT:  Sabrina [name]  49 y.o. female  PRE-OPERATIVE DIAGNOSIS:  left arm seroma  POST-OPERATIVE DIAGNOSIS:  left arm wound  PROCEDURE:  Procedure(s): IRRIGATION AND DEBRIDEMENT EXTREMITY, DRAINAGE OF LEFT ARM WOUND, A-CELL PLACEMENT, WOUND VAC PLACEMENT (Left)  SURGEON:  Surgeon(s) and Role:    * Claire S Dillingham, DO - Primary  PHYSICIAN ASSISTANT: Shawn Rayburn, PA  ASSISTANTS: none   ANESTHESIA:   general  EBL:     BLOOD ADMINISTERED:none  DRAINS: none   LOCAL MEDICATIONS USED:  NONE  SPECIMEN:  Source of Specimen:  micro  DISPOSITION OF SPECIMEN:  left arm micro  COUNTS:  YES  TOURNIQUET:  * No tourniquets in log *  DICTATION: .Dragon Dictation  PLAN OF CARE: Admit to inpatient   PATIENT DISPOSITION:  PACU - hemodynamically stable.   Delay start of Pharmacological VTE agent (>24hrs) due to surgical blood loss or risk of bleeding: no

## 2015-07-09 ENCOUNTER — Encounter (HOSPITAL_COMMUNITY): Payer: Self-pay | Admitting: Plastic Surgery

## 2015-07-09 DIAGNOSIS — T148 Other injury of unspecified body region: Secondary | ICD-10-CM

## 2015-07-09 DIAGNOSIS — E860 Dehydration: Secondary | ICD-10-CM

## 2015-07-09 DIAGNOSIS — L98491 Non-pressure chronic ulcer of skin of other sites limited to breakdown of skin: Secondary | ICD-10-CM

## 2015-07-09 DIAGNOSIS — L089 Local infection of the skin and subcutaneous tissue, unspecified: Secondary | ICD-10-CM

## 2015-07-09 LAB — CBC
HCT: 29.9 % — ABNORMAL LOW (ref 36.0–46.0)
HEMOGLOBIN: 9.9 g/dL — AB (ref 12.0–15.0)
MCH: 30.7 pg (ref 26.0–34.0)
MCHC: 33.1 g/dL (ref 30.0–36.0)
MCV: 92.9 fL (ref 78.0–100.0)
PLATELETS: 255 10*3/uL (ref 150–400)
RBC: 3.22 MIL/uL — AB (ref 3.87–5.11)
RDW: 12.5 % (ref 11.5–15.5)
WBC: 2.6 10*3/uL — AB (ref 4.0–10.5)

## 2015-07-09 MED ORDER — SULFAMETHOXAZOLE-TRIMETHOPRIM 800-160 MG PO TABS: 1.0000 | ORAL_TABLET | Freq: Two times a day (BID) | ORAL | Status: DC

## 2015-07-09 MED ORDER — SULFAMETHOXAZOLE-TRIMETHOPRIM 800-160 MG PO TABS
1.0000 | ORAL_TABLET | Freq: Two times a day (BID) | ORAL | Status: DC
  Administered 2015-07-09: 1 via ORAL
  Filled 2015-07-09 (×2): qty 1

## 2015-07-09 MED ORDER — HEPARIN SOD (PORK) LOCK FLUSH 100 UNIT/ML IV SOLN
250.0000 [IU] | INTRAVENOUS | Status: AC | PRN
  Administered 2015-07-09: 250 [IU]

## 2015-07-09 NOTE — Care Management Note (Signed)
Case Management Note  Patient Details  Name: Sabrina [name] MRN: [id number] Date of Birth: 02/13/1966  Subjective/Objective: Faxed wound vac forms for signature to Dr. Ulice Boldillingham @ fax#[phone] per Sean c#[phone], aware to fax back.  Once fax received by CM [facility].Once auth received, & wound vac here in hospital will need WOC cons for placement of wound vac. [facility] Await [facility] order.MD notified of current status.                  Action/Plan:d/c home w/HHC.   Expected Discharge Date:                  Expected Discharge Plan:  Home w Home Health Services  In-House Referral:     Discharge planning Services  CM Consult, Medication Assistance  Post Acute Care Choice:    Choice offered to:     DME Arranged:    DME Agency:     HH Arranged:    HH Agency:  [facility]  Status of Service:  In process, will continue to follow  Medicare Important Message Given:    Date Medicare IM Given:    Medicare IM give by:    Date Additional Medicare IM Given:    Additional Medicare Important Message give by:     If discussed at Long Length of Stay Meetings, dates discussed:    Additional Comments:  Sabrina [name], Sabrina Vanvranken, RN 07/09/2015, 12:00 PM

## 2015-07-09 NOTE — Progress Notes (Signed)
ANTIBIOTIC CONSULT NOTE - FOLLOW UP  Pharmacy Consult for Vancomycin, Zosyn Indication: wound infection  Allergies  Allergen Reactions  . Morphine And Related Itching  . Zofran [Ondansetron Hcl]     Spots around iv site  . Droperidol Palpitations    Patient Measurements: Height: 5\' 4"  (162.6 cm) Weight: 194 lb 7.1 oz (88.2 kg) IBW/kg (Calculated) : 54.7  Vital Signs: Temp: 98.3 F (36.8 C) (10/27 0536) Temp Source: Oral (10/27 0536) BP: 112/58 mmHg (10/27 0536) Pulse Rate: 94 (10/27 0536) Intake/Output from previous day: 10/26 0701 - 10/27 0700 In: 2090.8 [I.V.:1790.8; IV Piggyback:300] Out: 910 [Urine:900; Blood:10]  Labs:  Recent Labs  07/08/15 1030 07/09/15 0324  WBC  --  2.6*  HGB  --  9.9*  PLT  --  255  CREATININE 0.73  --    Estimated Creatinine Clearance: 93.5 mL/min (by C-G formula based on Cr of 0.73). No results for input(s): VANCOTROUGH, VANCOPEAK, VANCORANDOM, GENTTROUGH, GENTPEAK, GENTRANDOM, TOBRATROUGH, TOBRAPEAK, TOBRARND, AMIKACINPEAK, AMIKACINTROU, AMIKACIN in the last 72 hours.    Assessment: [age] yoF presented on 10/23 with arm swelling.  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  Pharmacy is consulted to dose vancomycin and Zosyn for wound infection.  She had wound vac placed on 10/26 s/p I&D of seroma.  10/23 >> zosyn >> 10/23 >> vancomycin >>   Today, 07/09/2015:  Afebrile  WBC low  SCr 0.7 with CrCl ~ 93 ml/min.  Blood and wound cultures in process.  Vancomycin trough level pending collection.  Goal of Therapy:  Vancomycin trough level 15-20 mcg/ml Appropriate abx dosing, eradication of infection.   Plan:   Zosyn 3.375g IV Q8H infused over 4hrs.  Vancomycin 1g IV q8h.  Measure Vanc trough at steady state.  Follow up renal fxn, culture results, and clinical course.  Lynann Beaverhristine Melia Hopes PharmD, BCPS Pager  [phone] 07/09/2015 12:05 PM

## 2015-07-09 NOTE — Discharge Summary (Signed)
Physician Discharge Summary  Sabrina [name] UJW:[id number] DOB: [birth year] DOA: 07/05/2015  PCP: [name], NP  Admit date: 2015/07/05 Discharge date: 07/09/2015  Recommendations for Outpatient Follow-up:  1. Pt will need to follow up with PCP in 2-3 weeks post discharge 2. Please obtain BMP to evaluate electrolytes and kidney function  Discharge Diagnoses:  Active Problems:   Skin ulcer of upper arm, limited to breakdown of skin (HCC)   Wound infection (HCC)   Dehydration   Tachycardia   Diarrhea   Wound infection after surgery    Discharge Condition: Stable  Diet recommendation: Heart healthy diet discussed in details   History of present illness:  49/F with PMH of R arm wound following cat bite 1 year back and multiple surgeries since, recent Arm surgery on 10/19 admitted with worsening swelling and pain around the surgical site. Plastics Dr.Dillingham consulting  Assessment/Plan: 1. Wound Infection/chronic Elbow infection -s/p Excision of left arm wound with primary layered closure 6 x 18 cm on 10/19 -drain with bloody discharge -cultures negative, WBC normalized , no fevers -Dr.Dillingham following, placed wound vac and per surgery, can be d/c home   2. Sepsis secondary to L arm wound infection -due to #1 -resolved, continue ABX Bactrim upon discharge to complete therapy   3. DVT prophylaxis - SCD's  Code Status: Full Family Communication: Pt in room Disposition Plan: Home    Procedures/Studies: Dg Elbow Complete Left  05-Jul-2015  CLINICAL DATA:  Postoperative infection. Elbow pain, nausea and emesis, onset last night. Surgery 3 days prior. EXAM: LEFT ELBOW - COMPLETE 3+ VIEW COMPARISON:  Radiographs and CT 05/01/2015 FINDINGS: Surgical drain in the lateral soft tissues. Surgical clips about the drain. Mild soft tissue edema, no soft tissue air. No elbow joint effusion. No erosion, periosteal reaction or bony destructive change. No acute osseous  abnormalities. IMPRESSION: Soft tissue edema with surgical drain in place. No soft tissue air or radiographic findings of osteomyelitis. Electronically Signed   By: Rubye Oaks M.D.   On: 07-05-15 20:45    Discharge Exam: Filed Vitals:   07/09/15 0536  BP: 112/58  Pulse: 94  Temp: 98.3 F (36.8 C)  Resp: 18   Filed Vitals:   07/08/15 1915 07/08/15 1930 07/08/15 1939 07/09/15 0536  BP: 122/71  129/56 112/58  Pulse: 70 76 75 94  Temp: 98 F (36.7 C)  98.4 F (36.9 C) 98.3 F (36.8 C)  TempSrc:    Oral  Resp: Height:      Weight:      SpO2: 100% 100% 100% 100%    General: Pt is alert, follows commands appropriately, not in acute distress Cardiovascular: Regular rate and rhythm, S1/S2 +, no murmurs, no rubs, no gallops Respiratory: Clear to auscultation bilaterally, no wheezing, no crackles, no rhonchi Abdominal: Soft, non tender, non distended, bowel sounds +, no guarding  Discharge Instructions  Discharge Instructions    Diet - low sodium heart healthy    Complete by:  As directed      Diet - low sodium heart healthy    Complete by:  As directed      Increase activity slowly    Complete by:  As directed      Increase activity slowly    Complete by:  As directed             Medication List    STOP taking these medications        cephALEXin 500 MG capsule  Commonly known as:  KEFLEX      TAKE these medications        gabapentin 300 MG capsule  Commonly known as:  NEURONTIN  Take 2 capsules (600 mg total) by mouth 3 (three) times daily.     HYDROcodone-acetaminophen 5-325 MG tablet  Commonly known as:  NORCO  Take 1-2 tablets by mouth every 6 (six) hours as needed for moderate pain.     sulfamethoxazole-trimethoprim 800-160 MG tablet  Commonly known as:  BACTRIM DS,SEPTRA DS  Take 1 tablet by mouth 2 (two) times daily.     traMADol 50 MG tablet  Commonly known as:  ULTRAM  Take 1 tablet (50 mg total) by mouth every 8 (eight) hours  as needed.           Follow-up Information    Follow up with [facility]              In 1 week.   Contact information:   [address] [place] [phone]      Follow up with Concepcion LivingBERNHARDT, [name], NP.   Specialty:  Family Medicine   Contact information:   [address] [place] [id number] [phone]        The results of significant diagnostics from this hospitalization (including imaging, microbiology, ancillary and laboratory) are listed below for reference.     Microbiology: Recent Results (from the past 240 hour(s))  Blood culture (routine x 2)     Status: None (Preliminary result)   Collection Time: 07/04/15  8:20 PM  Result Value Ref Range Status   Specimen Description BLOOD LEFT WRIST  Final   Special Requests IN PEDIATRIC BOTTLE 1CC  Final   Culture   Final    NO GROWTH 4 DAYS [facility]    Report Status PENDING  Incomplete  Blood culture (routine x 2)     Status: None (Preliminary result)   Collection Time: 07/04/15  8:33 PM  Result Value Ref Range Status   Specimen Description BLOOD RIGHT FOOT  Final   Special Requests IN PEDIATRIC BOTTLE 2CC  Final   Culture   Final    NO GROWTH 4 DAYS [facility]     Report Status PENDING  Incomplete  Wound culture     Status: None (Preliminary result)   Collection Time: 07/08/15  4:00 PM  Result Value Ref Range Status   Specimen Description WOUND  LEFT ELBOW  Final   Special Requests NONE  Final   Gram Stain   Final    RARE WBC PRESENT, PREDOMINANTLY MONONUCLEAR NO SQUAMOUS EPITHELIAL CELLS SEEN NO ORGANISMS SEEN [facility]    Culture   Final    NO GROWTH 1 DAY [facility] at [facility]    Report Status PENDING  Incomplete  Anaerobic culture     Status: None (Preliminary result)   Collection Time: 07/08/15  6:30 PM  Result Value Ref Range Status    Specimen Description WOUND LEFT ELBOW  Final   Special Requests NONE  Final   Gram Stain   Final    RARE WBC PRESENT, PREDOMINANTLY MONONUCLEAR NO SQUAMOUS EPITHELIAL CELLS SEEN NO ORGANISMS SEEN [facility]formed at [facility]    Culture PENDING  Incomplete   Report Status PENDING  Incomplete     Labs: Basic Metabolic Panel:  Recent Labs Lab 07/04/15 1850 07/05/15 0544 07/06/15 0334 07/08/15  1030  NA 139 QUESTIONABLE IDENTIFICATION / INCORRECTLY LABELED SPECIMEN 140 140  K 4.0 QUESTIONABLE IDENTIFICATION / INCORRECTLY LABELED SPECIMEN 3.9 3.7  CL 106 QUESTIONABLE IDENTIFICATION / INCORRECTLY LABELED SPECIMEN 106 107  CO2 24 QUESTIONABLE IDENTIFICATION / INCORRECTLY LABELED SPECIMEN 27 28  GLUCOSE 98 QUESTIONABLE IDENTIFICATION / INCORRECTLY LABELED SPECIMEN 94 91  BUN 21* QUESTIONABLE IDENTIFICATION / INCORRECTLY LABELED SPECIMEN 17 10  CREATININE 0.84 QUESTIONABLE IDENTIFICATION / INCORRECTLY LABELED SPECIMEN 0.80 0.73  CALCIUM 9.2 QUESTIONABLE IDENTIFICATION / INCORRECTLY LABELED SPECIMEN 8.8* 8.9  MG  --  QUESTIONABLE IDENTIFICATION / INCORRECTLY LABELED SPECIMEN  --   --   PHOS  --  QUESTIONABLE IDENTIFICATION / INCORRECTLY LABELED SPECIMEN  --   --    Liver Function Tests:  Recent Labs Lab 07/04/15 1850 07/05/15 0544  AST 21 QUESTIONABLE IDENTIFICATION / INCORRECTLY LABELED SPECIMEN  ALT 17 QUESTIONABLE IDENTIFICATION / INCORRECTLY LABELED SPECIMEN  ALKPHOS 97 QUESTIONABLE IDENTIFICATION / INCORRECTLY LABELED SPECIMEN  BILITOT 0.6 QUESTIONABLE IDENTIFICATION / INCORRECTLY LABELED SPECIMEN  PROT 7.5 QUESTIONABLE IDENTIFICATION / INCORRECTLY LABELED SPECIMEN  ALBUMIN 4.1 QUESTIONABLE IDENTIFICATION / INCORRECTLY LABELED SPECIMEN    Recent Labs Lab 07/04/15 1850  LIPASE 27   No results for input(s): AMMONIA in the last 168 hours. CBC:  Recent Labs Lab 07/04/15 1850 07/05/15 0544 07/06/15 1115 07/09/15 0324  WBC 8.4 QUESTIONABLE IDENTIFICATION /  INCORRECTLY LABELED SPECIMEN 4.8 2.6*  HGB 12.4 QUESTIONABLE IDENTIFICATION / INCORRECTLY LABELED SPECIMEN 10.3* 9.9*  HCT 37.3 QUESTIONABLE IDENTIFICATION / INCORRECTLY LABELED SPECIMEN 31.5* 29.9*  MCV 93.3 QUESTIONABLE IDENTIFICATION / INCORRECTLY LABELED SPECIMEN 96.3 92.9  PLT 255 QUESTIONABLE IDENTIFICATION / INCORRECTLY LABELED SPECIMEN 228 255   SIGNED: Time coordinating discharge: 30 minutes  Debbora Presto, MD  [facility] 07/09/2015, 12:07 PM Pager [phone]  If 7PM-7AM, please contact night-coverage [email or web address] Password [email or web address]

## 2015-07-09 NOTE — Discharge Instructions (Signed)
Wound Infection °A wound infection happens when a type of germ (bacteria) starts growing in the wound. In some cases, this can cause the wound to break open. If cared for properly, the infected wound will heal from the inside to the outside. Wound infections need treatment. °CAUSES °An infection is caused by bacteria growing in the wound.  °SYMPTOMS  °· Increase in redness, swelling, or pain at the wound site. °· Increase in drainage at the wound site. °· Wound or bandage (dressing) starts to smell bad. °· Fever. °· Feeling tired or fatigued. °· Pus draining from the wound. °TREATMENT  °Your health care provider will prescribe antibiotic medicine. The wound infection should improve within 24 to 48 hours. Any redness around the wound should stop spreading and the wound should be less painful.  °HOME CARE INSTRUCTIONS  °· Only take over-the-counter or prescription medicines for pain, discomfort, or fever as directed by your health care provider. °· Take your antibiotics as directed. Finish them even if you start to feel better. °· Gently wash the area with mild soap and water 2 times a day, or as directed. Rinse off the soap. Pat the area dry with a clean towel. Do not rub the wound. This may cause bleeding. °· Follow your health care provider's instructions for how often you need to change the dressing. °· Apply ointment and a dressing to the wound as directed. °· If the dressing sticks, moisten it with soapy water and gently remove it. °· Change the bandage right away if it becomes wet, dirty, or develops a bad smell. °· Take showers. Do not take tub baths, swim, or do anything that may soak the wound until it is healed. °· Avoid exercises that make you sweat heavily. °· Use anti-itch medicine as directed by your health care provider. The wound may itch when it is healing. Do not pick or scratch at the wound. °· Follow up with your health care provider to get your wound rechecked as directed. °SEEK MEDICAL CARE  IF: °· You have an increase in swelling, pain, or redness around the wound. °· You have an increase in the amount of pus coming from the wound. °· There is a bad smell coming from the wound. °· More of the wound breaks open. °· You have a fever. °MAKE SURE YOU:  °· Understand these instructions. °· Will watch your condition. °· Will get help right away if you are not doing well or get worse. °  °This information is not intended to replace advice given to you by your health care provider. Make sure you discuss any questions you have with your health care provider. °  °Document Released: 05/28/2003 Document Revised: 09/03/2013 Document Reviewed: 02/16/2015 °Elsevier Interactive Patient Education ©2016 [employer] Inc. ° °

## 2015-07-09 NOTE — Progress Notes (Signed)
Pt states she has made all her own F/U appts

## 2015-07-09 NOTE — Care Management Note (Signed)
Case Management Note  Patient Details  Name: Sabrina [name] MRN: [id number] Date of Birth: 10/28/1965  Subjective/Objective:  Per [facility] auth for wound vac approved. Delivery of home wound vac to [facility] [address] @ 5p.[facility] tel#[phone].MD/Nsg/Patient aware of current status.[facility] rep Kristen aware of current status & d/c home today w/home wound vac.                  Action/Plan:d/c home w/HHC.   Expected Discharge Date:                  Expected Discharge Plan:  Home w Home Health Services  In-House Referral:     Discharge planning Services  CM Consult, Medication Assistance  Post Acute Care Choice:    Choice offered to:  Patient  DME Arranged:  Vac DME Agency:  [facility]  HH Arranged:  RN, Social Work [facility]  Status of Service:  Completed, signed off  Medicare Important Message Given:    Date Medicare IM Given:    Medicare IM give by:    Date Additional Medicare IM Given:    Additional Medicare Important Message give by:     If discussed at Long Length of Stay Meetings, dates discussed:    Additional Comments:  Sabrina [name], Sabrina Arceneaux, RN 07/09/2015, 3:35 PM

## 2015-07-09 NOTE — Progress Notes (Signed)
Home wound vac here and in place the patient states fluent w/ use.Waiting for ride

## 2015-07-09 NOTE — Progress Notes (Signed)
D/c'd ambulating per request all belongings w/ pt as well as home supplies for wound vac and vac on and continuous suction at  

## 2015-07-09 NOTE — Care Management Note (Signed)
Case Management Note  Patient Details  Name: [name] Dicostanzo MRN: [id number] Date of Birth: 10/26/1965  Subjective/Objective: [facility] forms signed & [phone]. Await auth & delivery of [employer] wound vac.                   Action/Plan:d/c home w/HHC.   Expected Discharge Date:                  Expected Discharge Plan:  Home w Home Health Services  In-House Referral:     Discharge planning Services  CM Consult, Medication Assistance  Post Acute Care Choice:    Choice offered to:     DME Arranged:    DME Agency:     HH Arranged:  RN, Social Work [facility]  [facility]  Status of Service:  In process, will continue to follow  Medicare Important Message Given:    Date Medicare IM Given:    Medicare IM give by:    Date Additional Medicare IM Given:    Additional Medicare Important Message give by:     If discussed at Long Length of Stay Meetings, dates discussed:    Additional Comments:  Priebe ClamMahabir, Jenah Vanasten, RN 07/09/2015, 12:55 PM

## 2015-07-10 LAB — CULTURE, BLOOD (ROUTINE X 2)
CULTURE: NO GROWTH
Culture: NO GROWTH

## 2015-07-11 ENCOUNTER — Emergency Department (HOSPITAL_COMMUNITY)
Admission: EM | Admit: 2015-07-11 | Discharge: 2015-07-11 | Disposition: A | Discharge status: Home / Self Care | Payer: Self-pay | Attending: Emergency Medicine | Admitting: Emergency Medicine

## 2015-07-11 ENCOUNTER — Encounter (HOSPITAL_COMMUNITY): Payer: Self-pay | Admitting: *Deleted

## 2015-07-11 DIAGNOSIS — R110 Nausea: Secondary | ICD-10-CM | POA: Insufficient documentation

## 2015-07-11 DIAGNOSIS — F418 Other specified anxiety disorders: Secondary | ICD-10-CM | POA: Insufficient documentation

## 2015-07-11 DIAGNOSIS — Z872 Personal history of diseases of the skin and subcutaneous tissue: Secondary | ICD-10-CM | POA: Insufficient documentation

## 2015-07-11 DIAGNOSIS — Z79899 Other long term (current) drug therapy: Secondary | ICD-10-CM | POA: Insufficient documentation

## 2015-07-11 DIAGNOSIS — R000 Tachycardia, unspecified: Secondary | ICD-10-CM | POA: Insufficient documentation

## 2015-07-11 DIAGNOSIS — G8918 Other acute postprocedural pain: Secondary | ICD-10-CM | POA: Insufficient documentation

## 2015-07-11 DIAGNOSIS — Z8619 Personal history of other infectious and parasitic diseases: Secondary | ICD-10-CM | POA: Insufficient documentation

## 2015-07-11 DIAGNOSIS — Z792 Long term (current) use of antibiotics: Secondary | ICD-10-CM | POA: Insufficient documentation

## 2015-07-11 DIAGNOSIS — R2232 Localized swelling, mass and lump, left upper limb: Secondary | ICD-10-CM | POA: Insufficient documentation

## 2015-07-11 DIAGNOSIS — Z9889 Other specified postprocedural states: Secondary | ICD-10-CM | POA: Insufficient documentation

## 2015-07-11 LAB — COMPREHENSIVE METABOLIC PANEL
ALT: 26 U/L (ref 14–54)
ANION GAP: 9 (ref 5–15)
AST: 33 U/L (ref 15–41)
Albumin: 3.9 g/dL (ref 3.5–5.0)
Alkaline Phosphatase: 82 U/L (ref 38–126)
BILIRUBIN TOTAL: 0.3 mg/dL (ref 0.3–1.2)
BUN: 10 mg/dL (ref 6–20)
CO2: 23 mmol/L (ref 22–32)
Calcium: 9 mg/dL (ref 8.9–10.3)
Chloride: 109 mmol/L (ref 101–111)
Creatinine, Ser: 1.04 mg/dL — ABNORMAL HIGH (ref 0.44–1.00)
Glucose, Bld: 130 mg/dL — ABNORMAL HIGH (ref 65–99)
POTASSIUM: 3.1 mmol/L — AB (ref 3.5–5.1)
Sodium: 141 mmol/L (ref 135–145)
Total Protein: 7.1 g/dL (ref 6.5–8.1)

## 2015-07-11 LAB — CBC WITH DIFFERENTIAL/PLATELET
BASOS ABS: 0 10*3/uL (ref 0.0–0.1)
Basophils Relative: 0 %
EOS PCT: 5 %
Eosinophils Absolute: 0.3 10*3/uL (ref 0.0–0.7)
HCT: 33.8 % — ABNORMAL LOW (ref 36.0–46.0)
Hemoglobin: 11.1 g/dL — ABNORMAL LOW (ref 12.0–15.0)
LYMPHS PCT: 48 %
Lymphs Abs: 2.6 10*3/uL (ref 0.7–4.0)
MCH: 30.9 pg (ref 26.0–34.0)
MCHC: 32.8 g/dL (ref 30.0–36.0)
MCV: 94.2 fL (ref 78.0–100.0)
Monocytes Absolute: 0.4 10*3/uL (ref 0.1–1.0)
Monocytes Relative: 8 %
NEUTROS ABS: 2.1 10*3/uL (ref 1.7–7.7)
NEUTROS PCT: 39 %
PLATELETS: 275 10*3/uL (ref 150–400)
RBC: 3.59 MIL/uL — AB (ref 3.87–5.11)
RDW: 13.1 % (ref 11.5–15.5)
WBC: 5.3 10*3/uL (ref 4.0–10.5)

## 2015-07-11 LAB — I-STAT CG4 LACTIC ACID, ED: Lactic Acid, Venous: 2.21 mmol/L (ref 0.5–2.0)

## 2015-07-11 MED ORDER — PROMETHAZINE HCL 25 MG/ML IJ SOLN
25.0000 mg | Freq: Once | INTRAMUSCULAR | Status: AC
  Administered 2015-07-11: 25 mg via INTRAVENOUS
  Filled 2015-07-11: qty 1

## 2015-07-11 MED ORDER — HYDROMORPHONE HCL 1 MG/ML IJ SOLN
1.0000 mg | Freq: Once | INTRAMUSCULAR | Status: AC
Start: 2015-07-11 — End: 2015-07-11
  Administered 2015-07-11: 1 mg via INTRAVENOUS
  Filled 2015-07-11: qty 1

## 2015-07-11 MED ORDER — POTASSIUM CHLORIDE CRYS ER 20 MEQ PO TBCR
40.0000 meq | EXTENDED_RELEASE_TABLET | Freq: Once | ORAL | Status: AC
  Administered 2015-07-11: 40 meq via ORAL
  Filled 2015-07-11: qty 2

## 2015-07-11 NOTE — Discharge Instructions (Signed)
Continue Bactrim and Vicodin as directed by Dr. Kelly SplinterSanger. Follow-up with wound clinic on Monday for recheck. Return here for any new concerns.

## 2015-07-11 NOTE — ED Notes (Signed)
Notified nurse and PA results from istat lactic acid

## 2015-07-11 NOTE — ED Notes (Signed)
Nurse drawing labs. 

## 2015-07-11 NOTE — ED Provider Notes (Signed)
CSN: [id number]     Arrival date & time 07/11/15  1429 History   First MD Initiated Contact with Patient 07/11/15 1500     Chief Complaint  Patient presents with  . Post-Op, Wound Swelling      (Consider location/radiation/quality/duration/timing/severity/associated sxs/prior Treatment) The history is provided by the patient and medical records.    49 year old female with history of bullous pemphigus, staph infection and osteomyelitis from cat bite of left forearm approximately 1 year ago, here for postop complication.  Patient states she has had a total of 25 procedures on her left arm.  Treatment initially started in [place], but since moving to [place] has been seeing Dr. Wayland Denis.  Patient states she has had skin grafts of the area in the past, recent I&D last week by Dr. Kelly Splinter with wound vac placement shortly after, just discharged 3 days ago.  States today her wound nurse came to her home to change her wound vac when she noticed her arm was swelling and her wound vac was not draining at all.  Patient states she has been running a low grade temp at home around 7F.  She has had increased pain radiating into left hand and left upper arm as well.  States she called Dr. Leonie Green office, spoke with nurse and was told to come here.  Patient reports some nausea as well.  She has been taking her abx (bactrim) and pain meds (vicodin, motrin) as directed.  Slight tachycardia noted on arrival, other VSS.  Past Medical History  Diagnosis Date  . Bullous pemphigus   . Staph infection   . Cat bite   . 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 Situational anxiety   . PONV (postoperative nausea and vomiting)    Past Surgical History  Procedure Laterality Date  . Abdominal hysterectomy    . Appendectomy    . Skin graft    . 22 surgeries on left elbow    . Debridement and closure wound Left 07/01/2015    Procedure: LEFT ELBOW EXCISION OF WOUND WITH PRIMARY CLOSURE 2X5 CM ;  Surgeon: Peggye Form,  DO;  Location:  S[facility]ERY [facility];  Service: Plastics;  Laterality: Left;  . I&d extremity Left 07/08/2015    Procedure: IRRIGATION AND DEBRIDEMENT EXTREMITY, DRAINAGE OF LEFT ARM WOUND, A-CELL PLACEMENT, WOUND VAC PLACEMENT;  Surgeon: Alena Bills Dillingham, DO;  Location: [facility];  Service: Plastics;  Laterality: Left;   Family History  Problem Relation Age of Onset  . Liver disease Mother   . Dementia Mother   . Cancer Mother   . Cancer Maternal Grandmother    Social History  Substance Use Topics  . Smoking status: Never Smoker   . Smokeless tobacco: None  . Alcohol Use: No   OB History    No data available     Review of Systems  Skin: Positive for wound.  All other systems reviewed and are negative.     Allergies  Morphine and related; Zofran; and Droperidol  Home Medications   Prior to Admission medications   Medication Sig Start Date End Date Taking? Authorizing Provider  gabapentin (NEURONTIN) 300 MG capsule Take 2 capsules (600 mg total) by mouth 3 (three) times daily. 06/22/15   [name], NP  HYDROcodone-acetaminophen (NORCO) 5-325 MG tablet Take 1-2 tablets by mouth every 6 (six) hours as needed for moderate pain. 07/07/15   Zannie Cove, MD  sulfamethoxazole-trimethoprim (BACTRIM DS,SEPTRA DS) 800-160 MG tablet Take 1 tablet by mouth  2 (two) times daily. 07/09/15   Dorothea OgleIskra M Myers, MD  traMADol (ULTRAM) 50 MG tablet Take 1 tablet (50 mg total) by mouth every 8 (eight) hours as needed. Patient not taking: Reported on 07/04/2015 06/22/15   Henrietta HooverLinda C [name], NP   BP 121/76 mmHg  Pulse 104  Temp(Src) 98.4 F (36.9 C) (Oral)  Resp 20  SpO2 99%   Physical Exam  Constitutional: She is oriented to person, place, and time. She appears well-developed and well-nourished. No distress.  HENT:  Head: Normocephalic and atraumatic.  Mouth/Throat: Oropharynx is clear and moist.  Eyes: Conjunctivae and EOM are normal. Pupils are equal, round, and reactive to  light.  Neck: Normal range of motion. Neck supple.  Cardiovascular: Normal rate, regular rhythm and normal heart sounds.   Pulmonary/Chest: Effort normal and breath sounds normal. No respiratory distress. She has no wheezes.  Abdominal: Soft. Bowel sounds are normal. There is no tenderness. There is no guarding.  Musculoskeletal: Normal range of motion.  Left elbow/forearm with surgical wound along ulnar aspect; slight erythema noted to incision without overt lymphangitis or cellulitis; mild swelling of proximal forearm; no purulent drainage noted; arm is NVI; moving hand, wrist, and all fingers appropriately  Neurological: She is alert and oriented to person, place, and time.  Skin: Skin is warm and dry. She is not diaphoretic.  Psychiatric: She has a normal mood and affect.  Nursing note and vitals reviewed.       ED Course  Procedures (including critical care time) Labs Review Labs Reviewed  COMPREHENSIVE METABOLIC PANEL - Abnormal; Notable for the following:    Potassium 3.1 (*)    Glucose, Bld 130 (*)    Creatinine, Ser 1.04 (*)    All other components within normal limits  CBC WITH DIFFERENTIAL/PLATELET - Abnormal; Notable for the following:    RBC 3.59 (*)    Hemoglobin 11.1 (*)    HCT 33.8 (*)    All other components within normal limits  I-STAT CG4 LACTIC ACID, ED - Abnormal; Notable for the following:    Lactic Acid, Venous 2.21 (*)    All other components within normal limits  I-STAT CG4 LACTIC ACID, ED    Imaging Review No results found. I have personally reviewed and evaluated these images and lab results as part of my medical decision-making.   EKG Interpretation None      MDM   Final diagnoses:  Post-op pain   [age] year old female here for possible postop infection of her left arm. She has been having issues with this arm for the past year since developing osteomyelitis from a cat bite. She has been seeing Dr. Wayland Denislaire Sanger for this recently, had I&D of  surgical wound last week with wound VAC placement. Patient is afebrile, nontoxic. She has slight swelling of left proximal forearm without overt cellulitis or lymphangitis. Wound VAC was removed, see photo above. Wound overall appears well. There is no fluctuance, purulent drainage, or tissue crepitus.  Labwork is overall reassuring aside from mild hypokalemia which was replaced here in the emergency department.  Patient was given IV Dilaudid and Phenergan for her symptoms with improvement. Will speak with plastic surgery for recommendations.  4:31 PM Spoke with plastic surgery, Dr. Leta Baptisthimmappa, who is covering for Dr. Kelly SplinterSanger today-- has reviewed chart and photos of wound, does not feel this requires repeat surgical intervention or admission with IV abx at this time.  Wants to leave wound vac off, cover with non-stick dressings.  Continue bactrim  and vicodin at home. Follow up with Dr. Kelly Splinter on Monday in wound clinic.  This plan was discussed with patient, she acknowledged understanding and agrees with plan of care.  Of note, tachycardia resolved once pain controlled.  Garlon Hatchet, PA-C 07/11/15 1857  Rolland Porter, MD 07/29/15 [phone]

## 2015-07-11 NOTE — ED Notes (Signed)
Pt reports she was bitten by a cat around a year ago resulting in osteomyelitis with notable repeated surgeries (22 surgical procedures) treated with wound vac and eventually requiring a skin graft. This was initially treated in [place] but patient has since moved to [place], last week she was admitted by plastics and hand excision of a left arm graft with drain placement and closure done on 10/19, pt was discharged from hospital 10/27 with wound vac. Pt reports increased swelling and pain to area, reports wound vac is hardly draining anything. Nausea present.  Pain 8/10

## 2015-07-12 LAB — WOUND CULTURE

## 2015-07-13 LAB — ANAEROBIC CULTURE

## 2015-07-17 ENCOUNTER — Telehealth: Payer: Self-pay | Admitting: Family Medicine

## 2015-07-17 NOTE — Telephone Encounter (Signed)
Sabrina [name], Patient is requesting a refill on tramadol 50mg . LOV 06/22/2015 with Bonita QuinLinda, Please advise. Thanks!~

## 2015-07-20 ENCOUNTER — Encounter (HOSPITAL_BASED_OUTPATIENT_CLINIC_OR_DEPARTMENT_OTHER): Payer: No Typology Code available for payment source | Attending: Plastic Surgery

## 2015-07-20 ENCOUNTER — Encounter: Payer: Self-pay | Admitting: Family Medicine

## 2015-07-20 ENCOUNTER — Ambulatory Visit (INDEPENDENT_AMBULATORY_CARE_PROVIDER_SITE_OTHER): Payer: No Typology Code available for payment source | Admitting: Family Medicine

## 2015-07-20 DIAGNOSIS — W5501XA Bitten by cat, initial encounter: Secondary | ICD-10-CM | POA: Insufficient documentation

## 2015-07-20 DIAGNOSIS — F419 Anxiety disorder, unspecified: Secondary | ICD-10-CM | POA: Insufficient documentation

## 2015-07-20 DIAGNOSIS — S51052A Open bite, left elbow, initial encounter: Secondary | ICD-10-CM | POA: Insufficient documentation

## 2015-07-20 DIAGNOSIS — M25522 Pain in left elbow: Secondary | ICD-10-CM

## 2015-07-20 DIAGNOSIS — F411 Generalized anxiety disorder: Secondary | ICD-10-CM

## 2015-07-20 DIAGNOSIS — M25529 Pain in unspecified elbow: Secondary | ICD-10-CM | POA: Insufficient documentation

## 2015-07-20 DIAGNOSIS — Z8739 Personal history of other diseases of the musculoskeletal system and connective tissue: Secondary | ICD-10-CM | POA: Insufficient documentation

## 2015-07-20 MED ORDER — TRAMADOL HCL 50 MG PO TABS: 50.0000 mg | ORAL_TABLET | Freq: Three times a day (TID) | ORAL | Status: DC | PRN

## 2015-07-20 NOTE — Progress Notes (Signed)
Subjective:    Patient ID: Sabrina [name], female    DOB: [birth year], 49 y.o.   MRN: [id number]  HPI [name], a 49 year old female with a history of a left elbow wound with chronic pain presents for follow up. Sabrina [name] reports that she has had varying surgeries to left elbow after sustaining a cat bite. She is currently followed by wound care weekly for left elbow dressing changes. Her most recetn surgery was on 07/08/2015 by Dr. Ulice Boldillingham. Patient reports that current pain intensity is 7/10 described as intermittent and throbbing. She reports that pain typically increases at night. Pain intensity is heightened by increased activity and dressing changes. Patient denies fever, fatigue, nausea, vomiting, diarrhea, or dysuria.   Past Medical History  Diagnosis Date  . Bullous pemphigus   . Staph infection   . Cat bite   . 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 Situational anxiety   . PONV (postoperative nausea and vomiting)    Social History   Social History  . Marital Status: Single    Spouse Name: N/A  . Number of Children: N/A  . Years of Education: N/A   Occupational History  . Not on file.   Social History Main Topics  . Smoking status: Never Smoker   . Smokeless tobacco: Not on file  . Alcohol Use: No  . Drug Use: No  . Sexual Activity: Not on file   Other Topics Concern  . Not on file   Social History Narrative   Review of Systems  Constitutional: Negative for fever and fatigue.  Eyes: Negative for photophobia.  Cardiovascular: Negative.   Gastrointestinal: Negative.   Endocrine: Negative.  Negative for polydipsia, polyphagia and polyuria.  Musculoskeletal: Positive for myalgias (left elbow pain).       Left elbow pain  Allergic/Immunologic: Negative.   Neurological: Negative.   Hematological: Negative.   Psychiatric/Behavioral: The patient is nervous/anxious.        Objective:   Physical Exam  Constitutional: She is oriented to person, place, and  time.  HENT:  Head: Normocephalic.  Right Ear: External ear normal.  Mouth/Throat: Oropharynx is clear and moist.  Eyes: Conjunctivae and EOM are normal. Pupils are equal, round, and reactive to light.  Neck: Normal range of motion. Neck supple.  Pulmonary/Chest: Effort normal and breath sounds normal.  Abdominal: Soft. Bowel sounds are normal.  Musculoskeletal:       Right shoulder: She exhibits normal range of motion.  Unable to assess wound, dressing in place. Clean,dry, and intact  Neurological: She is alert and oriented to person, place, and time. She has normal reflexes.  Skin: Skin is warm and dry.  Psychiatric: She has a normal mood and affect. Her behavior is normal. Judgment and thought content normal.      BP 109/75 mmHg  Pulse 85  Temp(Src) 98.3 F (36.8 C) (Oral)  Resp 16  Ht 5\' 4"  (1.626 m)  Wt 197 lb (89.359 kg)  BMI 33.80 kg/m2  Assessment & Plan:   1.Elbow pain, left Current pain intensity is 7/10. Reviewed [place] Substance Reporting system prior to reorder. Patient has a history of chronic pain with frequent prescriptions of Tramadol. Patient warrants a referral to a pain clinic. Reminded patient that we do not treat chronic pain; she will need to follow-up with surgeon.  The patient was given clear instructions to go to ER or return to medical center if symptoms do not improve, worsen or new problems develop. The  patient verbalized understanding. - traMADol (ULTRAM) 50 MG tablet; Take 1 tablet (50 mg total) by mouth every 8 (eight) hours as needed.  Dispense: 30 tablet; Refill: 0  2. Anxiety state Patient states that she has anxiety frequently due to left arm pain. She reports that she often worries about pain and finances. She reports that she had been treated with Zoloft in the past without success. She reports that Xanax worked in the past.  Recommend that patient walk in to [facility] Monday-Friday 8am-3pm. She denies suicidal or homicidal intent.     Jyden Kromer M, FNP  RTC: Follow up with [name] as previously scheduled.

## 2015-07-20 NOTE — Patient Instructions (Signed)

## 2015-07-29 ENCOUNTER — Encounter (HOSPITAL_BASED_OUTPATIENT_CLINIC_OR_DEPARTMENT_OTHER): Payer: Self-pay | Admitting: *Deleted

## 2015-07-30 ENCOUNTER — Other Ambulatory Visit: Payer: Self-pay | Admitting: Plastic Surgery

## 2015-07-30 DIAGNOSIS — S41102D Unspecified open wound of left upper arm, subsequent encounter: Secondary | ICD-10-CM

## 2015-08-02 ENCOUNTER — Emergency Department (HOSPITAL_BASED_OUTPATIENT_CLINIC_OR_DEPARTMENT_OTHER)
Admission: EM | Admit: 2015-08-02 | Discharge: 2015-08-02 | Disposition: A | Discharge status: Home / Self Care | Payer: No Typology Code available for payment source | Attending: Emergency Medicine | Admitting: Emergency Medicine

## 2015-08-02 ENCOUNTER — Encounter (HOSPITAL_BASED_OUTPATIENT_CLINIC_OR_DEPARTMENT_OTHER): Payer: Self-pay | Admitting: Emergency Medicine

## 2015-08-02 DIAGNOSIS — Z872 Personal history of diseases of the skin and subcutaneous tissue: Secondary | ICD-10-CM | POA: Insufficient documentation

## 2015-08-02 DIAGNOSIS — Z79899 Other long term (current) drug therapy: Secondary | ICD-10-CM | POA: Insufficient documentation

## 2015-08-02 DIAGNOSIS — F419 Anxiety disorder, unspecified: Secondary | ICD-10-CM | POA: Insufficient documentation

## 2015-08-02 DIAGNOSIS — Z8619 Personal history of other infectious and parasitic diseases: Secondary | ICD-10-CM | POA: Insufficient documentation

## 2015-08-02 DIAGNOSIS — Z9889 Other specified postprocedural states: Secondary | ICD-10-CM | POA: Insufficient documentation

## 2015-08-02 DIAGNOSIS — Z8739 Personal history of other diseases of the musculoskeletal system and connective tissue: Secondary | ICD-10-CM | POA: Insufficient documentation

## 2015-08-02 MED ORDER — LORAZEPAM 1 MG PO TABS: 1.0000 mg | ORAL_TABLET | Freq: Three times a day (TID) | ORAL | Status: DC | PRN

## 2015-08-02 NOTE — Discharge Instructions (Signed)

## 2015-08-02 NOTE — ED Provider Notes (Signed)
CSN: [id number]     Arrival date & time 08/02/15  1211 History   First MD Initiated Contact with Patient 08/02/15 1336     Chief Complaint  Patient presents with  . Anxiety   HPI  Sabrina [name] is a 49 year old female with PMHx of anxiety and osteomyelitis presenting with anxiety. She reports that she was bit by a cat on the left elbow over a year ago and has had multiple complications since. She states that she has had 26th surgery on her elbow in the next 2 weeks and she is becoming increasingly anxious about it. She states that her mother and dog recently passed away and this is also causing stress in her life. She states that she has been having panic attacks over the past year. She was treated with a short course of Xanax when her mother passed reports this helped improve her symptoms. She does not have any when necessary anxiety medicines at home. She states that she is mostly anxious because she "wants her life back". She states that she has a difficult time focusing and that this makes her feel overwhelmed. She is also endorsing difficulty sleeping because "my thoughts just won't stop". She denies physical manifestations of her anxiety. She has no other complaints at this time. Denies depression, SI, HI or AVH.  Past Medical History  Diagnosis Date  . Bullous pemphigus   . Staph infection   . Cat bite   . 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 Situational anxiety   . PONV (postoperative nausea and vomiting)    Past Surgical History  Procedure Laterality Date  . Abdominal hysterectomy    . Appendectomy    . Skin graft    . 22 surgeries on left elbow    . Debridement and closure wound Left 07/01/2015    Procedure: LEFT ELBOW EXCISION OF WOUND WITH PRIMARY CLOSURE 2X5 CM ;  Surgeon: Peggye Form, DO;  Location: [facility];  Service: Plastics;  Laterality: Left;  . I&d extremity Left 07/08/2015    Procedure: IRRIGATION AND DEBRIDEMENT EXTREMITY, DRAINAGE OF LEFT ARM  WOUND, A-CELL PLACEMENT, WOUND VAC PLACEMENT;  Surgeon: Alena Bills Dillingham, DO;  Location: [facility];  Service: Plastics;  Laterality: Left;   Family History  Problem Relation Age of Onset  . Liver disease Mother   . Dementia Mother   . Cancer Mother   . Cancer Maternal Grandmother    Social History  Substance Use Topics  . Smoking status: Never Smoker   . Smokeless tobacco: None  . Alcohol Use: No   OB History    No data available     Review of Systems  Psychiatric/Behavioral: The patient is nervous/anxious.   All other systems reviewed and are negative.     Allergies  Morphine and related; Zofran; and Droperidol  Home Medications   Prior to Admission medications   Medication Sig Start Date End Date Taking? Authorizing Provider  gabapentin (NEURONTIN) 300 MG capsule Take 2 capsules (600 mg total) by mouth 3 (three) times daily. 06/22/15  Yes [name], NP  ibuprofen (ADVIL,MOTRIN) 100 MG tablet Take 200 mg by mouth every 8 (eight) hours as needed for pain or fever.   Yes Historical Provider, MD  LORazepam (ATIVAN) 1 MG tablet Take 1 tablet (1 mg total) by mouth every 8 (eight) hours as needed for anxiety. 08/02/15   Elisah Parmer, PA-C   BP 110/72 mmHg  Pulse 91  Temp(Src) 98.9  F (37.2 C) (Oral)  Resp 16  Ht 5\' 4"  (1.626 m)  Wt 190 lb (86.183 kg)  BMI 32.60 kg/m2  SpO2 100% Physical Exam  Constitutional: She appears well-developed and well-nourished. No distress.  Tearful  HENT:  Head: Normocephalic and atraumatic.  Eyes: Conjunctivae and EOM are normal. Right eye exhibits no discharge. Left eye exhibits no discharge. No scleral icterus.  Neck: Normal range of motion.  Cardiovascular: Normal rate and regular rhythm.   Pulmonary/Chest: Effort normal. No respiratory distress.  Musculoskeletal: Normal range of motion.  Moves all extremities spontaneously without pain. Walks with a steady gait.  Neurological: She is alert. Coordination normal.  Skin: Skin  is warm and dry.  Psychiatric: She has a normal mood and affect. Her behavior is normal.  Nursing note and vitals reviewed.   ED Course  Procedures (including critical care time) Labs Review Labs Reviewed - No data to display  Imaging Review No results found. I have personally reviewed and evaluated these images and lab results as part of my medical decision-making.   EKG Interpretation None      MDM   Final diagnoses:  Anxiety   Patient presenting with increased situational anxiety. She reports recent stressors including mother passing, dog passing and upcoming 26th surgery on her left elbow. She states she is having trouble focusing, becomes overwhelmed easily and has been having difficulty sleeping at night. She does not currently have any anxiety medicines at home. She has a follow-up appointment with her PCP in 2 weeks. Vital signs stable. Patient is tearful when discussing her anxiety. Benign exam. We'll give very short course of Ativan and encouraged patient to only use it if she needs it. Patient states that she will call her PCP in attempt to move her appointment up sooner for evaluation of ongoing anxiety. Patient is agreeable with the plan and stable for discharge. Rolm Gala.    Marlan Steward, PA-C 08/02/15 [id number]  Vanetta MuldersScott Zackowski, MD 08/03/15 1240

## 2015-08-02 NOTE — ED Notes (Signed)
Pt reports lack of sleep and anxiety due to upcoming surgeries, reports previous anxity attacks in past but states that they did not last this long, pt also reports lack of sleep

## 2015-08-02 NOTE — ED Notes (Signed)
Complains of anxiety due to long hx of numerous surgeries due to a catch bit that got infected.  Reports getting ready to have 26th surgery and she is having a hard time focusing on things and sleeping.  Reports she just feels overwhelmed and all she wants to do is go back to work.  Pt complains of loss of mother and dog passing away and inability to relax.  Pt reports was treated when her mother died with something and it helped but unsure what it was.  Pt is calm, cooperative.

## 2015-08-02 NOTE — ED Notes (Signed)
Spoke with patient about delay.  Verallized understanding.Offered patient drink and something to eat but patient declined.  No distress noted.

## 2015-08-03 ENCOUNTER — Other Ambulatory Visit: Payer: Self-pay | Admitting: Family Medicine

## 2015-08-03 MED ORDER — TRAMADOL HCL 50 MG PO TABS: 50.0000 mg | ORAL_TABLET | Freq: Three times a day (TID) | ORAL | Status: DC | PRN

## 2015-08-03 NOTE — Telephone Encounter (Signed)
Refill request for Tramadol 50mg . LOV 07/20/2015. Please advise. Patient is scheduled to start with pain management in 3 weeks. Thanks!

## 2015-08-05 ENCOUNTER — Ambulatory Visit (HOSPITAL_BASED_OUTPATIENT_CLINIC_OR_DEPARTMENT_OTHER): Payer: Self-pay | Admitting: Anesthesiology

## 2015-08-05 ENCOUNTER — Ambulatory Visit (HOSPITAL_BASED_OUTPATIENT_CLINIC_OR_DEPARTMENT_OTHER)
Admission: RE | Admit: 2015-08-05 | Discharge: 2015-08-05 | Disposition: A | Discharge status: Home / Self Care | Payer: Self-pay | Source: Ambulatory Visit | Attending: Plastic Surgery | Admitting: Plastic Surgery

## 2015-08-05 ENCOUNTER — Ambulatory Visit (HOSPITAL_BASED_OUTPATIENT_CLINIC_OR_DEPARTMENT_OTHER): Payer: No Typology Code available for payment source | Admitting: Anesthesiology

## 2015-08-05 ENCOUNTER — Encounter (HOSPITAL_BASED_OUTPATIENT_CLINIC_OR_DEPARTMENT_OTHER)
Admission: RE | Disposition: A | Discharge status: Home / Self Care | Payer: Self-pay | Source: Ambulatory Visit | Attending: Plastic Surgery

## 2015-08-05 ENCOUNTER — Encounter (HOSPITAL_BASED_OUTPATIENT_CLINIC_OR_DEPARTMENT_OTHER): Payer: Self-pay | Admitting: Plastic Surgery

## 2015-08-05 DIAGNOSIS — F430 Acute stress reaction: Secondary | ICD-10-CM | POA: Insufficient documentation

## 2015-08-05 DIAGNOSIS — S51052A Open bite, left elbow, initial encounter: Secondary | ICD-10-CM | POA: Insufficient documentation

## 2015-08-05 DIAGNOSIS — S41102D Unspecified open wound of left upper arm, subsequent encounter: Secondary | ICD-10-CM

## 2015-08-05 DIAGNOSIS — W5501XA Bitten by cat, initial encounter: Secondary | ICD-10-CM | POA: Insufficient documentation

## 2015-08-05 HISTORY — PX: INCISION AND DRAINAGE OF WOUND: SHX1803

## 2015-08-05 HISTORY — PX: APPLICATION OF A-CELL OF EXTREMITY: SHX6303

## 2015-08-05 SURGERY — IRRIGATION AND DEBRIDEMENT WOUND
Anesthesia: General | Site: Elbow | Laterality: Left

## 2015-08-05 MED ORDER — SUCCINYLCHOLINE CHLORIDE 20 MG/ML IJ SOLN
INTRAMUSCULAR | Status: AC
  Filled 2015-08-05: qty 1

## 2015-08-05 MED ORDER — MIDAZOLAM HCL 2 MG/2ML IJ SOLN
1.0000 mg | INTRAMUSCULAR | Status: DC | PRN
  Administered 2015-08-05: 2 mg via INTRAVENOUS

## 2015-08-05 MED ORDER — PROMETHAZINE HCL 25 MG/ML IJ SOLN
INTRAMUSCULAR | Status: AC
  Filled 2015-08-05: qty 1

## 2015-08-05 MED ORDER — CEFAZOLIN SODIUM-DEXTROSE 2-3 GM-% IV SOLR
INTRAVENOUS | Status: AC
  Filled 2015-08-05: qty 50

## 2015-08-05 MED ORDER — DEXAMETHASONE SODIUM PHOSPHATE 4 MG/ML IJ SOLN
INTRAMUSCULAR | Status: DC | PRN
  Administered 2015-08-05: 10 mg via INTRAVENOUS

## 2015-08-05 MED ORDER — FENTANYL CITRATE (PF) 100 MCG/2ML IJ SOLN
INTRAMUSCULAR | Status: AC
  Filled 2015-08-05: qty 2

## 2015-08-05 MED ORDER — FENTANYL CITRATE (PF) 100 MCG/2ML IJ SOLN
25.0000 ug | INTRAMUSCULAR | Status: DC | PRN
  Administered 2015-08-05 (×4): 50 ug via INTRAVENOUS

## 2015-08-05 MED ORDER — LACTATED RINGERS IV SOLN
INTRAVENOUS | Status: DC
  Administered 2015-08-05 (×2): via INTRAVENOUS

## 2015-08-05 MED ORDER — SCOPOLAMINE 1 MG/3DAYS TD PT72: 1.0000 | MEDICATED_PATCH | Freq: Once | TRANSDERMAL | Status: DC | PRN

## 2015-08-05 MED ORDER — PROPOFOL 10 MG/ML IV BOLUS
INTRAVENOUS | Status: DC | PRN
  Administered 2015-08-05: 260 mg via INTRAVENOUS

## 2015-08-05 MED ORDER — HYDROCODONE-ACETAMINOPHEN 5-325 MG PO TABS: 1.0000 | ORAL_TABLET | Freq: Four times a day (QID) | ORAL | Status: DC | PRN

## 2015-08-05 MED ORDER — KETOROLAC TROMETHAMINE 30 MG/ML IJ SOLN
INTRAMUSCULAR | Status: AC
  Filled 2015-08-05: qty 1

## 2015-08-05 MED ORDER — FENTANYL CITRATE (PF) 100 MCG/2ML IJ SOLN: 50.0000 ug | INTRAMUSCULAR | Status: DC | PRN

## 2015-08-05 MED ORDER — DEXAMETHASONE SODIUM PHOSPHATE 10 MG/ML IJ SOLN
INTRAMUSCULAR | Status: AC
  Filled 2015-08-05: qty 1

## 2015-08-05 MED ORDER — ONDANSETRON HCL 4 MG/2ML IJ SOLN
INTRAMUSCULAR | Status: AC
  Filled 2015-08-05: qty 2

## 2015-08-05 MED ORDER — LIDOCAINE HCL (CARDIAC) 20 MG/ML IV SOLN
INTRAVENOUS | Status: AC
  Filled 2015-08-05: qty 5

## 2015-08-05 MED ORDER — PROMETHAZINE HCL 25 MG/ML IJ SOLN
6.2500 mg | INTRAMUSCULAR | Status: DC | PRN
  Administered 2015-08-05: 6.25 mg via INTRAVENOUS

## 2015-08-05 MED ORDER — GLYCOPYRROLATE 0.2 MG/ML IJ SOLN: 0.2000 mg | Freq: Once | INTRAMUSCULAR | Status: DC | PRN

## 2015-08-05 MED ORDER — CEFAZOLIN SODIUM-DEXTROSE 2-3 GM-% IV SOLR
2.0000 g | INTRAVENOUS | Status: AC
  Administered 2015-08-05: 2 g via INTRAVENOUS

## 2015-08-05 MED ORDER — OXYCODONE HCL 5 MG PO TABS
ORAL_TABLET | ORAL | Status: AC
  Filled 2015-08-05: qty 1

## 2015-08-05 MED ORDER — KETOROLAC TROMETHAMINE 30 MG/ML IJ SOLN
30.0000 mg | Freq: Once | INTRAMUSCULAR | Status: AC
  Administered 2015-08-05: 30 mg via INTRAVENOUS

## 2015-08-05 MED ORDER — KETOROLAC TROMETHAMINE 30 MG/ML IJ SOLN: 30.0000 mg | Freq: Once | INTRAMUSCULAR | Status: DC

## 2015-08-05 MED ORDER — FENTANYL CITRATE (PF) 100 MCG/2ML IJ SOLN
25.0000 ug | INTRAMUSCULAR | Status: DC | PRN
  Administered 2015-08-05: 100 ug via INTRAVENOUS

## 2015-08-05 MED ORDER — EPHEDRINE SULFATE 50 MG/ML IJ SOLN
INTRAMUSCULAR | Status: DC | PRN
  Administered 2015-08-05: 10 mg via INTRAVENOUS

## 2015-08-05 MED ORDER — ARTIFICIAL TEARS OP OINT
TOPICAL_OINTMENT | OPHTHALMIC | Status: AC
  Filled 2015-08-05: qty 3.5

## 2015-08-05 MED ORDER — OXYCODONE HCL 5 MG PO TABS
5.0000 mg | ORAL_TABLET | Freq: Once | ORAL | Status: AC
  Administered 2015-08-05: 5 mg via ORAL

## 2015-08-05 MED ORDER — PHENYLEPHRINE HCL 10 MG/ML IJ SOLN
INTRAMUSCULAR | Status: DC | PRN
  Administered 2015-08-05: 80 ug via INTRAVENOUS

## 2015-08-05 MED ORDER — MIDAZOLAM HCL 2 MG/2ML IJ SOLN
INTRAMUSCULAR | Status: AC
  Filled 2015-08-05: qty 2

## 2015-08-05 MED ORDER — PROMETHAZINE HCL 25 MG/ML IJ SOLN: 6.2500 mg | INTRAMUSCULAR | Status: DC | PRN

## 2015-08-05 MED ORDER — PROPOFOL 10 MG/ML IV BOLUS
INTRAVENOUS | Status: AC
  Filled 2015-08-05: qty 20

## 2015-08-05 SURGICAL SUPPLY — 76 items
BAG DECANTER FOR FLEXI CONT (MISCELLANEOUS) ×2 IMPLANT
BANDAGE ELASTIC 3 VELCRO ST LF (GAUZE/BANDAGES/DRESSINGS) IMPLANT
BANDAGE ELASTIC 4 VELCRO ST LF (GAUZE/BANDAGES/DRESSINGS) IMPLANT
BANDAGE ELASTIC 6 VELCRO ST LF (GAUZE/BANDAGES/DRESSINGS) IMPLANT
BENZOIN TINCTURE PRP APPL 2/3 (GAUZE/BANDAGES/DRESSINGS) IMPLANT
BLADE HEX COATED 2.75 (ELECTRODE) IMPLANT
BLADE SURG 10 STRL SS (BLADE) IMPLANT
BLADE SURG 15 STRL LF DISP TIS (BLADE) ×1 IMPLANT
BLADE SURG 15 STRL SS (BLADE) ×1
BNDG COHESIVE 4X5 TAN STRL (GAUZE/BANDAGES/DRESSINGS) IMPLANT
BNDG GAUZE ELAST 4 BULKY (GAUZE/BANDAGES/DRESSINGS) ×4 IMPLANT
CANISTER SUCT 1200ML W/VALVE (MISCELLANEOUS) IMPLANT
CHLORAPREP W/TINT 26ML (MISCELLANEOUS) IMPLANT
COVER BACK TABLE 60X90IN (DRAPES) ×2 IMPLANT
COVER MAYO STAND STRL (DRAPES) ×2 IMPLANT
DECANTER SPIKE VIAL GLASS SM (MISCELLANEOUS) IMPLANT
DRAIN CHANNEL 19F RND (DRAIN) IMPLANT
DRAIN PENROSE 1/2X12 LTX STRL (WOUND CARE) IMPLANT
DRAPE INCISE IOBAN 66X45 STRL (DRAPES) IMPLANT
DRAPE LAPAROSCOPIC ABDOMINAL (DRAPES) IMPLANT
DRAPE LAPAROTOMY 100X72 PEDS (DRAPES) IMPLANT
DRAPE U-SHAPE 76X120 STRL (DRAPES) ×2 IMPLANT
DRSG ADAPTIC 3X8 NADH LF (GAUZE/BANDAGES/DRESSINGS) ×2 IMPLANT
DRSG EMULSION OIL 3X3 NADH (GAUZE/BANDAGES/DRESSINGS) IMPLANT
DRSG PAD ABDOMINAL 8X10 ST (GAUZE/BANDAGES/DRESSINGS) IMPLANT
ELECT REM PT RETURN 9FT ADLT (ELECTROSURGICAL) ×2
ELECTRODE REM PT RTRN 9FT ADLT (ELECTROSURGICAL) ×1 IMPLANT
EVACUATOR SILICONE 100CC (DRAIN) IMPLANT
GAUZE SPONGE 4X4 12PLY STRL (GAUZE/BANDAGES/DRESSINGS) ×2 IMPLANT
GAUZE XEROFORM 1X8 LF (GAUZE/BANDAGES/DRESSINGS) IMPLANT
GAUZE XEROFORM 5X9 LF (GAUZE/BANDAGES/DRESSINGS) IMPLANT
GLOVE BIO SURGEON STRL SZ 6.5 (GLOVE) ×4 IMPLANT
GOWN STRL REUS W/ TWL LRG LVL3 (GOWN DISPOSABLE) ×2 IMPLANT
GOWN STRL REUS W/TWL LRG LVL3 (GOWN DISPOSABLE) ×2
IV NS IRRIG 3000ML ARTHROMATIC (IV SOLUTION) IMPLANT
MANIFOLD NEPTUNE II (INSTRUMENTS) IMPLANT
MATRIX SURGICAL PSM 5X5CM (Tissue) ×2 IMPLANT
MICROMATRIX 500MG (Tissue) ×2 IMPLANT
NEEDLE HYPO 25X1 1.5 SAFETY (NEEDLE) IMPLANT
NS IRRIG 1000ML POUR BTL (IV SOLUTION) ×2 IMPLANT
PACK BASIN DAY SURGERY FS (CUSTOM PROCEDURE TRAY) ×2 IMPLANT
PADDING CAST ABS 3INX4YD NS (CAST SUPPLIES)
PADDING CAST ABS 4INX4YD NS (CAST SUPPLIES)
PADDING CAST ABS COTTON 3X4 (CAST SUPPLIES) IMPLANT
PADDING CAST ABS COTTON 4X4 ST (CAST SUPPLIES) IMPLANT
PENCIL BUTTON HOLSTER BLD 10FT (ELECTRODE) ×2 IMPLANT
PIN SAFETY STERILE (MISCELLANEOUS) IMPLANT
SHEET MEDIUM DRAPE 40X70 STRL (DRAPES) ×2 IMPLANT
SLEEVE SCD COMPRESS KNEE MED (MISCELLANEOUS) IMPLANT
SOLUTION PARTIC MCRMTRX 500MG (Tissue) ×1 IMPLANT
SPLINT FIBERGLASS 3X35 (CAST SUPPLIES) ×2 IMPLANT
SPLINT FIBERGLASS 4X30 (CAST SUPPLIES) IMPLANT
SPONGE GAUZE 4X4 12PLY STER LF (GAUZE/BANDAGES/DRESSINGS) IMPLANT
SPONGE LAP 18X18 X RAY DECT (DISPOSABLE) IMPLANT
STAPLER VISISTAT 35W (STAPLE) IMPLANT
STOCKINETTE IMPERVIOUS LG (DRAPES) IMPLANT
STRIP CLOSURE SKIN 1/2X4 (GAUZE/BANDAGES/DRESSINGS) IMPLANT
SUCTION FRAZIER TIP 10 FR DISP (SUCTIONS) IMPLANT
SURGILUBE 2OZ TUBE FLIPTOP (MISCELLANEOUS) IMPLANT
SUT MNCRL AB 4-0 PS2 18 (SUTURE) ×2 IMPLANT
SUT MON AB 3-0 SH 27 (SUTURE)
SUT MON AB 3-0 SH27 (SUTURE) IMPLANT
SUT SILK 3 0 PS 1 (SUTURE) IMPLANT
SUT VIC AB 3-0 FS2 27 (SUTURE) IMPLANT
SUT VIC AB 5-0 PS2 18 (SUTURE) ×2 IMPLANT
SUT VICRYL 4-0 PS2 18IN ABS (SUTURE) IMPLANT
SWAB COLLECTION DEVICE MRSA (MISCELLANEOUS) IMPLANT
SWAB CULTURE ESWAB REG 1ML (MISCELLANEOUS) IMPLANT
SYR BULB IRRIGATION 50ML (SYRINGE) ×2 IMPLANT
SYR CONTROL 10ML LL (SYRINGE) IMPLANT
TAPE HYPAFIX 6X30 (GAUZE/BANDAGES/DRESSINGS) IMPLANT
TOWEL OR 17X24 6PK STRL BLUE (TOWEL DISPOSABLE) ×2 IMPLANT
TRAY DSU PREP LF (CUSTOM PROCEDURE TRAY) ×2 IMPLANT
TUBE CONNECTING 20X1/4 (TUBING) ×2 IMPLANT
UNDERPAD 30X30 (UNDERPADS AND DIAPERS) ×2 IMPLANT
YANKAUER SUCT BULB TIP NO VENT (SUCTIONS) ×2 IMPLANT

## 2015-08-05 NOTE — Anesthesia Procedure Notes (Signed)
Procedure Name: LMA Insertion Date/Time: 08/05/2015 9:41 AM Performed by: Zenia ResidesPAYNE, Beldon Nowling D Pre-anesthesia Checklist: Patient identified, Emergency Drugs available, Suction available and Patient being monitored Patient Re-evaluated:Patient Re-evaluated prior to inductionOxygen Delivery Method: Circle System Utilized Preoxygenation: Pre-oxygenation with 100% oxygen Intubation Type: IV induction Ventilation: Mask ventilation without difficulty LMA: LMA inserted LMA Size: 4.0 Number of attempts: 1 Airway Equipment and Method: Bite block Placement Confirmation: positive ETCO2 Tube secured with: Tape Dental Injury: Teeth and Oropharynx as per pre-operative assessment

## 2015-08-05 NOTE — Interval H&P Note (Signed)
History and Physical Interval Note:  08/05/2015 7:41 AM  Sabrina [name]  has presented today for surgery, with the diagnosis of LEFT ELBOW  WOUND   The various methods of treatment have been discussed with the patient and family. After consideration of risks, benefits and other options for treatment, the patient has consented to  Procedure(s): IRRIGATION AND DEBRIDEMENT LEFT ELBOW WOUND, PLACEMENT OF ACELL (Left) APPLICATION OF A-CELL OF EXTREMITY (Left) as a surgical intervention .  The patient's history has been reviewed, patient examined, no change in status, stable for surgery.  I have reviewed the patient's chart and labs.  Questions were answered to the patient's satisfaction.     Peggye FormLAIRE S Kayleah Appleyard

## 2015-08-05 NOTE — Op Note (Signed)
Operative Note   DATE OF OPERATION: 08/05/2015  [facility]  SURGICAL DIVISION: Plastic Surgery  PREOPERATIVE DIAGNOSES:  Left arm wound 5 x 5 cm  POSTOPERATIVE DIAGNOSES:  same  PROCEDURE:  Left arm preparation for Acell placement (5 x 5 and 500 mg powder)   SURGEON: Wayland Denislaire Sanger, DO  ASSISTANT: Shawn Rayburn, PA  ANESTHESIA:  General.   COMPLICATIONS: None.   INDICATIONS FOR PROCEDURE:  The patient, Sabrina [name] is a 49 y.o. female born on 10/01/1965, is here for treatment of a left arm wound after cat bite. MRN: [id number]  CONSENT:  Informed consent was obtained directly from the patient. Risks, benefits and alternatives were fully discussed. Specific risks including but not limited to bleeding, infection, hematoma, seroma, scarring, pain, infection, contracture, asymmetry, wound healing problems, and need for further surgery were all discussed. The patient did have an ample opportunity to have questions answered to satisfaction.   DESCRIPTION OF PROCEDURE:  The patient was taken to the operating room. SCDs were placed and IV antibiotics were given. The patient's operative site was prepped and draped in a sterile fashion. A time out was performed and all information was confirmed to be correct.  General anesthesia was administered.  The distal wound was debrided 5 x 5 cm with the #10 blade and curette. Hemostasis was achieved with electrocautery.  Antibiotic solution was used to irrigate the wound.   All of the Acell powder and sheet were applied and secured with 5-0 Monocryl.   The adaptic was applied and secured with 4-0 Silk.  The patient tolerated the procedure well.  There were no complications. The patient was allowed to wake from anesthesia, extubated and taken to the recovery room in satisfactory condition.

## 2015-08-05 NOTE — Anesthesia Preprocedure Evaluation (Signed)
Anesthesia Evaluation  Patient identified by MRN, date of birth, ID band Patient awake    Reviewed: Allergy & Precautions, NPO status , Patient's Chart, lab work & pertinent test results  History of Anesthesia Complications (+) PONV  Airway Mallampati: II  TM Distance: >3 FB Neck ROM: Full    Dental no notable dental hx.    Pulmonary neg pulmonary ROS,    Pulmonary exam normal breath sounds clear to auscultation       Cardiovascular negative cardio ROS Normal cardiovascular exam Rhythm:Regular Rate:Normal     Neuro/Psych negative neurological ROS  negative psych ROS   GI/Hepatic negative GI ROS, Neg liver ROS,   Endo/Other  negative endocrine ROS  Renal/GU negative Renal ROS  negative genitourinary   Musculoskeletal negative musculoskeletal ROS (+)   Abdominal   Peds negative pediatric ROS (+)  Hematology negative hematology ROS (+)   Anesthesia Other Findings   Reproductive/Obstetrics negative OB ROS                             Anesthesia Physical Anesthesia Plan  ASA: II  Anesthesia Plan: General   Post-op Pain Management:    Induction: Intravenous  Airway Management Planned: LMA  Additional Equipment:   Intra-op Plan:   Post-operative Plan: Extubation in OR  Informed Consent: I have reviewed the patients History and Physical, chart, labs and discussed the procedure including the risks, benefits and alternatives for the proposed anesthesia with the patient or authorized representative who has indicated his/her understanding and acceptance.   Dental advisory given  Plan Discussed with: CRNA and Surgeon  Anesthesia Plan Comments:         Anesthesia Quick Evaluation

## 2015-08-05 NOTE — Brief Op Note (Signed)
08/05/2015  10:24 AM  PATIENT:  Maryclare Labradorana Monforte  49 y.o. female  PRE-OPERATIVE DIAGNOSIS:  LEFT ELBOW  WOUND   POST-OPERATIVE DIAGNOSIS:  LEFT ELBOW  WOUND   PROCEDURE:  Procedure(s): IRRIGATION AND DEBRIDEMENT LEFT ELBOW WOUND, PLACEMENT OF ACELL (Left) APPLICATION OF A-CELL OF EXTREMITY (Left)  SURGEON:  Surgeon(s) and Role:    * Alena Billslaire S Athanasia Stanwood, DO - Primary  PHYSICIAN ASSISTANT: Shawn Rayburn, PA  ASSISTANTS: none   ANESTHESIA:   general  EBL:  Total I/O In: 700 [I.V.:700] Out: -   BLOOD ADMINISTERED:none  DRAINS: none   LOCAL MEDICATIONS USED:  NONE  SPECIMEN:  No Specimen  DISPOSITION OF SPECIMEN:  N/A  COUNTS:  YES  TOURNIQUET:  * No tourniquets in log *  DICTATION: .Dragon Dictation  PLAN OF CARE: Discharge to home after PACU  PATIENT DISPOSITION:  PACU - hemodynamically stable.   Delay start of Pharmacological VTE agent (>24hrs) due to surgical blood loss or risk of bleeding: no

## 2015-08-05 NOTE — Transfer of Care (Signed)
Immediate Anesthesia Transfer of Care Note  Patient: Sabrina LabradorDana Balding  Procedure(s) Performed: Procedure(s): IRRIGATION AND DEBRIDEMENT LEFT ELBOW WOUND, PLACEMENT OF ACELL (Left) APPLICATION OF A-CELL OF EXTREMITY (Left)  Patient Location: PACU  Anesthesia Type:General  Level of Consciousness: awake, alert  and oriented  Airway & Oxygen Therapy: Patient Spontanous Breathing and Patient connected to face mask oxygen  Post-op Assessment: Report given to RN and Post -op Vital signs reviewed and stable  Post vital signs: Reviewed and stable  Last Vitals:  Filed Vitals:   08/05/15 0827 08/05/15 1027  BP: 112/62   Pulse: 85 109  Temp: 36.7 C 36.9 C  Resp: 16     Complications: No apparent anesthesia complications

## 2015-08-05 NOTE — H&P (View-Only) (Signed)
  Subjective: The patient was seen yesterday and doing better.  Tolerating liquids and food.  Redness improved on the arm.  Still swollen as expected for surgery.  Objective: Vital signs in last 24 hours: Temp:  [97.8 F (36.6 C)-98.4 F (36.9 C)] 97.8 F (36.6 C) (10/25 0526) Pulse Rate:  [78-103] 78 (10/25 0526) Resp:  [16-20] 16 (10/25 0526) BP: (108-112)/(54-56) 112/56 mmHg (10/25 0526) SpO2:  [97 %-100 %] 100 % (10/25 0526) Last BM Date: 07/06/15  Intake/Output from previous day: 10/24 0701 - 10/25 0700 In: 3050 [I.V.:2300; IV Piggyback:750] Out: 600 [Urine:600] Intake/Output this shift:    General appearance: alert, cooperative and no distress  Lab Results:   Recent Labs  07/05/15 0544 07/06/15 1115  WBC 12.7* 4.8  HGB 9.1* 10.3*  HCT 29.1* 31.5*  PLT 344 228   BMET  Recent Labs  07/05/15 0544 07/06/15 0334  NA 133* 140  K 4.1 3.9  CL 103 106  CO2 25 27  GLUCOSE 112* 94  BUN 8 17  CREATININE 0.97 0.80  CALCIUM 8.7* 8.8*   PT/INR No results for input(s): LABPROT, INR in the last 72 hours. ABG No results for input(s): PHART, HCO3 in the last 72 hours.  Invalid input(s): PCO2, PO2  Studies/Results: No results found.  Anti-infectives: Anti-infectives    Start     Dose/Rate Route Frequency Ordered Stop   07/05/15 0800  vancomycin (VANCOCIN) IVPB 1000 mg/200 mL premix     1,000 mg 200 mL/hr over 60 Minutes Intravenous Every 8 hours 07/05/15 0009     07/05/15 0800  piperacillin-tazobactam (ZOSYN) IVPB 3.375 g     3.375 g 12.5 mL/hr over 240 Minutes Intravenous Every 8 hours 07/05/15 0010     07/04/15 2345  vancomycin (VANCOCIN) IVPB 1000 mg/200 mL premix     1,000 mg 200 mL/hr over 60 Minutes Intravenous  Once 07/04/15 2322 07/05/15 0105   07/04/15 2330  piperacillin-tazobactam (ZOSYN) IVPB 3.375 g  Status:  Discontinued     3.375 g 100 mL/hr over 30 Minutes Intravenous  Once 07/04/15 2321 07/06/15 1322   07/04/15 2015   piperacillin-tazobactam (ZOSYN) IVPB 3.375 g  Status:  Discontinued     3.375 g 12.5 mL/hr over 240 Minutes Intravenous  Once 07/04/15 2014 07/04/15 2321   07/04/15 2015  vancomycin (VANCOCIN) IVPB 1000 mg/200 mL premix  Status:  Discontinued     1,000 mg 200 mL/hr over 60 Minutes Intravenous  Once 07/04/15 2014 07/04/15 2322      Assessment/Plan: s/p * No surgery found * Likely ready for discharge with antibiotics and follow up next week.  LOS: 3 days    Markeith Jue S Deondrea Markos 07/07/2015  

## 2015-08-05 NOTE — Anesthesia Postprocedure Evaluation (Signed)
Anesthesia Post Note  Patient: Sabrina [name]  Procedure(s) Performed: Procedure(s) (LRB): IRRIGATION AND DEBRIDEMENT LEFT ELBOW WOUND, PLACEMENT OF ACELL (Left) APPLICATION OF A-CELL OF EXTREMITY (Left)  Patient location during evaluation: PACU Anesthesia Type: General Level of consciousness: awake and alert Pain management: pain level controlled Vital Signs Assessment: post-procedure vital signs reviewed and stable Respiratory status: spontaneous breathing, nonlabored ventilation, respiratory function stable and patient connected to nasal cannula oxygen Cardiovascular status: blood pressure returned to baseline and stable Postop Assessment: No signs of nausea or vomiting Anesthetic complications: no    Last Vitals:  Filed Vitals:   08/05/15 1100 08/05/15 1115  BP: 112/73 112/77  Pulse: 86 91  Temp:  37 C  Resp: 10 12    Last Pain:  Filed Vitals:   08/05/15 1138  PainSc: 4                  [name] S

## 2015-08-05 NOTE — Discharge Instructions (Signed)
Apply VAC in next 1 - 2 days if able KY gel daily if VAC not on. VAC change once a week.    Post Anesthesia Home Care Instructions  Activity: Get plenty of rest for the remainder of the day. A responsible adult should stay with you for 24 hours following the procedure.  For the next 24 hours, DO NOT: -Drive a car -Advertising copywriterperate machinery -Drink alcoholic beverages -Take any medication unless instructed by your physician -Make any legal decisions or sign important papers.  Meals: Start with liquid foods such as gelatin or soup. Progress to regular foods as tolerated. Avoid greasy, spicy, heavy foods. If nausea and/or vomiting occur, drink only clear liquids until the nausea and/or vomiting subsides. Call your physician if vomiting continues.  Special Instructions/Symptoms: Your throat may feel dry or sore from the anesthesia or the breathing tube placed in your throat during surgery. If this causes discomfort, gargle with warm salt water. The discomfort should disappear within 24 hours.  If you had a scopolamine patch placed behind your ear for the management of post- operative nausea and/or vomiting:  1. The medication in the patch is effective for 72 hours, after which it should be removed.  Wrap patch in a tissue and discard in the trash. Wash hands thoroughly with soap and water. 2. You may remove the patch earlier than 72 hours if you experience unpleasant side effects which may include dry mouth, dizziness or visual disturbances. 3. Avoid touching the patch. Wash your hands with soap and water after contact with the patch.

## 2015-08-10 ENCOUNTER — Encounter (HOSPITAL_BASED_OUTPATIENT_CLINIC_OR_DEPARTMENT_OTHER): Payer: Self-pay | Admitting: Plastic Surgery

## 2015-08-13 ENCOUNTER — Encounter: Payer: Self-pay | Admitting: Family Medicine

## 2015-08-13 ENCOUNTER — Other Ambulatory Visit: Payer: Self-pay | Admitting: Family Medicine

## 2015-08-13 ENCOUNTER — Ambulatory Visit (INDEPENDENT_AMBULATORY_CARE_PROVIDER_SITE_OTHER): Payer: No Typology Code available for payment source | Admitting: Family Medicine

## 2015-08-13 VITALS — BP 108/65 | HR 88 | Temp 98.4°F | Wt 198.0 lb

## 2015-08-13 DIAGNOSIS — M25522 Pain in left elbow: Secondary | ICD-10-CM

## 2015-08-13 DIAGNOSIS — H9202 Otalgia, left ear: Secondary | ICD-10-CM

## 2015-08-13 MED ORDER — TRAMADOL HCL 50 MG PO TABS: 50.0000 mg | ORAL_TABLET | Freq: Three times a day (TID) | ORAL | Status: DC | PRN

## 2015-08-13 NOTE — Progress Notes (Signed)
Patient ID: Sabrina [name], female   DOB: 04/18/1966, 49 y.o.   MRN: [id number]   Sabrina [name], is a 49 y.o. female  QIO:[id number]  LKG:[id number]  DOB - September 16, 1965  CC:  Chief Complaint  Patient presents with  . Ear Fullness  . Pain    left elbow       HPI: Sabrina [name] is a 49 y.o. female here to establish care Allergies  Allergen Reactions  . Morphine And Related Itching  . Zofran [Ondansetron Hcl]     Spots around iv site  . Droperidol Palpitations   Past Medical History  Diagnosis Date  . Bullous pemphigus   . Staph infection   . Cat bite   . 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 Situational anxiety   . PONV (postoperative nausea and vomiting)    Current Outpatient Prescriptions on File Prior to Visit  Medication Sig Dispense Refill  . gabapentin (NEURONTIN) 300 MG capsule Take 2 capsules (600 mg total) by mouth 3 (three) times daily. 180 capsule 3  . HYDROcodone-acetaminophen (NORCO) 5-325 MG tablet Take 1 tablet by mouth every 6 (six) hours as needed for moderate pain. 30 tablet 0  . ibuprofen (ADVIL,MOTRIN) 100 MG tablet Take 200 mg by mouth every 8 (eight) hours as needed for pain or fever.    Marland Kitchen LORazepam (ATIVAN) 1 MG tablet Take 1 tablet (1 mg total) by mouth every 8 (eight) hours as needed for anxiety. 6 tablet 0   No current facility-administered medications on file prior to visit.   Family History  Problem Relation Age of Onset  . Liver disease Mother   . Dementia Mother   . Cancer Mother   . Cancer Maternal Grandmother    Social History   Social History  . Marital Status: Single    Spouse Name: N/A  . Number of Children: N/A  . Years of Education: N/A   Occupational History  . Not on file.   Social History Main Topics  . Smoking status: Never Smoker   . Smokeless tobacco: Never Used  . Alcohol Use: No  . Drug Use: No  . Sexual Activity: Not on file   Other Topics Concern  . Not on file   Social History Narrative    Review of  Systems: Constitutional: Negative  HENT: Negative except for left ear discomfort and occasional possing Eyes: Negative  Neck: Negative  Respiratory: Negative  Cardiovascular: Negative  Gastrointestinal: Negative  Genitourinary: Negative  Musculoskeletal: Positive for left elbow pain Neurological: Negative  numbness  Hematological: Negative  Psychiatric/Behavioral: Negative     Objective:   Filed Vitals:   08/13/15 0819  BP: 108/65  Pulse: 88  Temp: 98.4 F (36.9 C)    Physical Exam: Constitutional: Patient appears well-developed and well-nourished. No distress. HENT: Normocephalic, atraumatic, External right and left ear normal. Oropharynx is clear and moist.  Eyes: Conjunctivae and EOM are normal. PERRLA, no scleral icterus. Neck: Normal ROM. Neck supple. No lymphadenopathy, No thyromegaly. CVS: RRR, S1/S2 +, no murmurs, no gallops, no rubs Pulmonary: Effort and breath sounds normal, no stridor, rhonchi, wheezes, rales.  Neuro: Alert.Normal muscle tone coordination. Non-focal Skin: Skin is warm and dry. No rash noted. Not diaphoretic. No erythema. No pallor. Psychiatric: Normal mood and affect. Behavior, judgment, thought content normal.  Lab Results  Component Value Date   WBC 5.3 07/11/2015   HGB 11.1* 07/11/2015   HCT 33.8* 07/11/2015   MCV 94.2 07/11/2015   PLT 275 07/11/2015   Lab  Results  Component Value Date   CREATININE 1.04* 07/11/2015   BUN 10 07/11/2015   NA 141 07/11/2015   K 3.1* 07/11/2015   CL 109 07/11/2015   CO2 23 07/11/2015    Lab Results  Component Value Date   HGBA1C  07/05/2015    QUESTIONABLE IDENTIFICATION / INCORRECTLY LABELED SPECIMEN   Lipid Panel     Component Value Date/Time   CHOL 237* 04/27/2015 1449   TRIG 144 04/27/2015 1449   HDL 70 04/27/2015 1449   CHOLHDL 3.4 04/27/2015 1449   VLDL 29 04/27/2015 1449   LDLCALC 138* 04/27/2015 1449       Assessment and plan:   1. Elbow pain, left - Refill of Tramadol 50  mg, #30, one po q 8 hours prn, 0 RFS  2. Ear ache, left -Follow-up if worsens.   Return if symptoms worsen or fail to improve.  The patient was given clear instructions to go to ER or return to medical center if symptoms don't improve, worsen or new problems develop. The patient verbalized understanding.      Henrietta HooverLinda C. [name], MSN, FNP-BC   08/13/2015, 8:40 AM

## 2015-08-13 NOTE — Progress Notes (Unsigned)
Started in error

## 2015-08-13 NOTE — Patient Instructions (Signed)
There is no evidence for ear infection I have refilled Tramadol. Will need to coordinate with Dr. Kelly SplinterSanger prior to further refills.

## 2015-08-17 ENCOUNTER — Encounter (HOSPITAL_BASED_OUTPATIENT_CLINIC_OR_DEPARTMENT_OTHER): Payer: No Typology Code available for payment source | Attending: Plastic Surgery

## 2015-08-17 DIAGNOSIS — S51052A Open bite, left elbow, initial encounter: Secondary | ICD-10-CM | POA: Insufficient documentation

## 2015-08-17 DIAGNOSIS — W5501XA Bitten by cat, initial encounter: Secondary | ICD-10-CM | POA: Insufficient documentation

## 2015-08-17 DIAGNOSIS — Z8739 Personal history of other diseases of the musculoskeletal system and connective tissue: Secondary | ICD-10-CM | POA: Insufficient documentation

## 2015-08-24 ENCOUNTER — Telehealth: Payer: Self-pay | Admitting: Family Medicine

## 2015-08-24 NOTE — Telephone Encounter (Signed)
I gave her a prescription on December 1st with a notation that is must last a month. It is just the 12th.She needs a referral to a pain clinic.

## 2015-08-24 NOTE — Telephone Encounter (Signed)
Called and left message, advised of rx written for tramadol on 08/13/2015 should have lasted a month and this would not be able to be refilled at this time. Per Concepcion LivingLinda [name], NP she suggest that patient speak with wound care for pain management. Thanks!

## 2015-08-24 NOTE — Telephone Encounter (Signed)
Refill request for tramadol 50mg . LOV 08/13/2015. Please advise. Thanks!

## 2015-09-01 ENCOUNTER — Encounter (HOSPITAL_COMMUNITY): Payer: Self-pay | Admitting: Emergency Medicine

## 2015-09-01 ENCOUNTER — Emergency Department (INDEPENDENT_AMBULATORY_CARE_PROVIDER_SITE_OTHER)
Admission: EM | Admit: 2015-09-01 | Discharge: 2015-09-01 | Disposition: A | Discharge status: Home / Self Care | Payer: No Typology Code available for payment source | Source: Home / Self Care | Attending: Family Medicine | Admitting: Family Medicine

## 2015-09-01 DIAGNOSIS — M26621 Arthralgia of right temporomandibular joint: Secondary | ICD-10-CM

## 2015-09-01 DIAGNOSIS — S0083XA Contusion of other part of head, initial encounter: Secondary | ICD-10-CM

## 2015-09-01 MED ORDER — HYDROCODONE-ACETAMINOPHEN 5-325 MG PO TABS: 1.0000 | ORAL_TABLET | ORAL | Status: DC | PRN

## 2015-09-01 NOTE — Discharge Instructions (Signed)
Facial or Scalp Contusion A facial or scalp contusion is a deep bruise on the face or head. Injuries to the face and head generally cause a lot of swelling, especially around the eyes. Contusions are the result of an injury that caused bleeding under the skin. The contusion may turn blue, purple, or yellow. Minor injuries will give you a painless contusion, but more severe contusions may stay painful and swollen for a few weeks.  CAUSES  A facial or scalp contusion is caused by a blunt injury or trauma to the face or head area.  SIGNS AND SYMPTOMS   Swelling of the injured area.   Discoloration of the injured area.   Tenderness, soreness, or pain in the injured area.  DIAGNOSIS  The diagnosis can be made by taking a medical history and doing a physical exam. An X-ray exam, CT scan, or MRI may be needed to determine if there are any associated injuries, such as broken bones (fractures). TREATMENT  Often, the best treatment for a facial or scalp contusion is applying cold compresses to the injured area. Over-the-counter medicines may also be recommended for pain control.  HOME CARE INSTRUCTIONS   Only take over-the-counter or prescription medicines as directed by your health care provider.   Apply ice to the injured area.   Put ice in a plastic bag.   Place a towel between your skin and the bag.   Leave the ice on for 20 minutes, 2-3 times a day.  SEEK MEDICAL CARE IF:  You have bite problems.   You have pain with chewing.   You are concerned about facial defects. SEEK IMMEDIATE MEDICAL CARE IF:  You have severe pain or a headache that is not relieved by medicine.   You have unusual sleepiness, confusion, or personality changes.   You throw up (vomit).   You have a persistent nosebleed.   You have double vision or blurred vision.   You have fluid drainage from your nose or ear.   You have difficulty walking or using your arms or legs.  MAKE SURE YOU:     Understand these instructions.  Will watch your condition.  Will get help right away if you are not doing well or get worse.   This information is not intended to replace advice given to you by your health care provider. Make sure you discuss any questions you have with your health care provider.   Document Released: 10/06/2004 Document Revised: 09/19/2014 Document Reviewed: 04/11/2013 Elsevier Interactive Patient Education 2016 [employer]  Jaw Contusion A jaw contusion is a deep bruise of the jaw. Contusions are the result of an injury to muscles and tissue under the skin, which causes bleeding under the skin. The contusion may turn blue, purple, or yellow. Minor injuries will cause a painless contusion, but more severe contusions may stay painful and swollen for a few weeks. CAUSES This condition is usually caused by trauma, direct force, or a hard hit (blow) to the jaw. SYMPTOMS Symptoms of this condition include:  Jaw pain.  Jaw swelling.  Jaw bruising, redness, or discoloration.  Jaw tenderness or soreness. DIAGNOSIS This condition is diagnosed with a medical history and a physical exam. An X-ray, CT scan, or MRI may be needed to determine if there were any associated injuries, such as broken bones (fractures). TREATMENT Often, the best treatment for this condition is applying cold compresses to the injured area and eating a soft diet. Over-the-counter medicines may also be recommended for pain  control. HOME CARE INSTRUCTIONS Diet  Eat soft foods as told by your health care provider. Soft foods include baby food, gelatin, oatmeal, ice cream, applesauce, bananas, eggs, pasta, cottage cheese, soups, and yogurt.  Cut food into smaller pieces. This makes it easier to chew.  Avoid chewing gum or ice. General Instructions  If directed, apply ice to the injured area:  Put ice in a plastic bag.  Place a towel between your skin and the bag.  Leave the ice on for 20  minutes, 2-3 times per day.  Take over-the-counter and prescription medicines only as told by your health care provider.  Avoid opening your mouth widely. This includes opening your mouth to eat large pieces of food or to yawn, scream, yell, or sing.  Keep all follow-up visits as told by your health care provider. This is important. SEEK MEDICAL CARE IF:  Your pain is not controlled with medicine.  Your symptoms do not improve with treatment or they get worse.  You have new symptoms. SEEK IMMEDIATE MEDICAL CARE IF:  You have any new cracking or clicking in your jaw.  You have trouble eating or you cannot eat.   This information is not intended to replace advice given to you by your health care provider. Make sure you discuss any questions you have with your health care provider.   Document Released: 11/19/2003 Document Revised: 05/20/2015 Document Reviewed: 11/24/2014 Elsevier Interactive Patient Education [employer]

## 2015-09-01 NOTE — ED Notes (Signed)
Patient reports her horse swung his head and the two collided.  Horses head connected with her right jaw.  Right jaw is swollen and patient detects click when opening mouth.  Reports ear is starting to hurt.  Patient did not loose consciousness.  Patient alert and oriented x 4.  No broken teeth

## 2015-09-01 NOTE — ED Provider Notes (Signed)
CSN: [id number]     Arrival date & time 09/01/15  1418 History   First MD Initiated Contact with Patient 09/01/15 1620     Chief Complaint  Patient presents with  . Mouth Injury   (Consider location/radiation/quality/duration/timing/severity/associated sxs/prior Treatment) HPI Comments: 49 year old female was feeding her horse this morning when the horse's head raised up and contacted her in the right jaw. She is complaining of pain to the right TMJ. There is also swelling along the right mandible. She has some pain with opening her mouth completely. She is able to move her jaw side to side without apparent limitation. She has used ice this afternoon as well as taking Motrin and Ultram. She still continues to have pain.   Past Medical History  Diagnosis Date  . Bullous pemphigus   . Staph infection   . Cat bite   . 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 Situational anxiety   . PONV (postoperative nausea and vomiting)    Past Surgical History  Procedure Laterality Date  . Abdominal hysterectomy    . Appendectomy    . Skin graft    . 22 surgeries on left elbow    . Debridement and closure wound Left 07/01/2015    Procedure: LEFT ELBOW EXCISION OF WOUND WITH PRIMARY CLOSURE 2X5 CM ;  Surgeon: Peggye Formlaire S Dillingham, DO;  Location: [facility];  Service: Plastics;  Laterality: Left;  . I&d extremity Left 07/08/2015    Procedure: IRRIGATION AND DEBRIDEMENT EXTREMITY, DRAINAGE OF LEFT ARM WOUND, A-CELL PLACEMENT, WOUND VAC PLACEMENT;  Surgeon: Alena Billslaire S Dillingham, DO;  Location: [facility];  Service: Plastics;  Laterality: Left;  . Incision and drainage of wound Left 08/05/2015    Procedure: IRRIGATION AND DEBRIDEMENT LEFT ELBOW WOUND, PLACEMENT OF ACELL;  Surgeon: Peggye Formlaire S Dillingham, DO;  Location: [facility];  Service: Plastics;  Laterality: Left;  . Application of a-cell of extremity Left 08/05/2015    Procedure: APPLICATION OF A-CELL OF EXTREMITY;  Surgeon: Peggye Formlaire S  Dillingham, DO;  Location: [facility];  Service: Plastics;  Laterality: Left;   Family History  Problem Relation Age of Onset  . Liver disease Mother   . Dementia Mother   . Cancer Mother   . Cancer Maternal Grandmother    Social History  Substance Use Topics  . Smoking status: Never Smoker   . Smokeless tobacco: Never Used  . Alcohol Use: No   OB History    No data available     Review of Systems  Constitutional: Negative for fever, activity change and fatigue.  HENT: Negative for congestion, hearing loss, postnasal drip and sore throat.   Eyes: Negative.   Respiratory: Negative.   Musculoskeletal: Negative.   Neurological: Negative for dizziness, tremors, facial asymmetry, speech difficulty and headaches.    Allergies  Morphine and related; Zofran; and Droperidol  Home Medications   Prior to Admission medications   Medication Sig Start Date End Date Taking? Authorizing Provider  gabapentin (NEURONTIN) 300 MG capsule Take 2 capsules (600 mg total) by mouth 3 (three) times daily. 06/22/15  Yes Henrietta HooverLinda C [name], NP  ibuprofen (ADVIL,MOTRIN) 100 MG tablet Take 200 mg by mouth every 8 (eight) hours as needed for pain or fever.   Yes Historical Provider, MD  traMADol (ULTRAM) 50 MG tablet Take 1 tablet (50 mg total) by mouth every 8 (eight) hours as needed for severe pain (must last for one month.). 08/13/15  Yes Henrietta HooverLinda C [name], NP  HYDROcodone-acetaminophen (NORCO/VICODIN) 5-325 MG tablet Take 1 tablet by mouth every 4 (four) hours as needed. 09/01/15   Hayden Rasmussen, NP  LORazepam (ATIVAN) 1 MG tablet Take 1 tablet (1 mg total) by mouth every 8 (eight) hours as needed for anxiety. 08/02/15   Rolm Gala Barrett, PA-C   Meds Ordered and Administered this Visit  Medications - No data to display  BP 116/79 mmHg  Pulse 99  Temp(Src) 98 F (36.7 C) (Oral)  Resp 18  SpO2 100% No data found.   Physical Exam  Constitutional: She is oriented to person, place, and  time. She appears well-developed and well-nourished. No distress.  HENT:  Head: Normocephalic.  There is tenderness to the left TMJ and the pterygoid muscle. Minor swelling over the right mandible. She is able to open the jaw and estimated 75%. Bilateral movement is normal. When closing the jaw there is a slight palpable click on the right. No discoloration. Teeth are intact. No intraoral injury.  Eyes: EOM are normal. Pupils are equal, round, and reactive to light.  Neck: Normal range of motion. Neck supple.  Cardiovascular: Normal rate.   Pulmonary/Chest: Effort normal. No respiratory distress.  Lymphadenopathy:    She has no cervical adenopathy.  Neurological: She is alert and oriented to person, place, and time. No cranial nerve deficit.  Skin: Skin is warm and dry.  Nursing note and vitals reviewed.   ED Course  Procedures (including critical care time)  Labs Review Labs Reviewed - No data to display  Imaging Review No results found.   Visual Acuity Review  Right Eye Distance:   Left Eye Distance:   Bilateral Distance:    Right Eye Near:   Left Eye Near:    Bilateral Near:         MDM   1. Contusion of jaw, initial encounter   2. Arthralgia of right temporomandibular joint    Ice, soft mechanical diet NSAIDs Norco 5 mg #15 F/U with PCP if worse or not improving Discussed imaging, panorex, pt st unable to afford but will consider if worse or not improving    Hayden Rasmussen, NP 09/01/15 1648

## 2015-09-09 ENCOUNTER — Encounter (HOSPITAL_BASED_OUTPATIENT_CLINIC_OR_DEPARTMENT_OTHER): Payer: Self-pay | Admitting: *Deleted

## 2015-09-09 ENCOUNTER — Emergency Department (HOSPITAL_BASED_OUTPATIENT_CLINIC_OR_DEPARTMENT_OTHER)
Admission: EM | Admit: 2015-09-09 | Discharge: 2015-09-09 | Disposition: A | Discharge status: Home / Self Care | Payer: No Typology Code available for payment source | Attending: Emergency Medicine | Admitting: Emergency Medicine

## 2015-09-09 DIAGNOSIS — Z87828 Personal history of other (healed) physical injury and trauma: Secondary | ICD-10-CM | POA: Insufficient documentation

## 2015-09-09 DIAGNOSIS — Z8619 Personal history of other infectious and parasitic diseases: Secondary | ICD-10-CM | POA: Insufficient documentation

## 2015-09-09 DIAGNOSIS — Z8739 Personal history of other diseases of the musculoskeletal system and connective tissue: Secondary | ICD-10-CM | POA: Insufficient documentation

## 2015-09-09 DIAGNOSIS — B9789 Other viral agents as the cause of diseases classified elsewhere: Secondary | ICD-10-CM

## 2015-09-09 DIAGNOSIS — Z79899 Other long term (current) drug therapy: Secondary | ICD-10-CM | POA: Insufficient documentation

## 2015-09-09 DIAGNOSIS — H9209 Otalgia, unspecified ear: Secondary | ICD-10-CM | POA: Insufficient documentation

## 2015-09-09 DIAGNOSIS — J069 Acute upper respiratory infection, unspecified: Secondary | ICD-10-CM | POA: Insufficient documentation

## 2015-09-09 DIAGNOSIS — Z872 Personal history of diseases of the skin and subcutaneous tissue: Secondary | ICD-10-CM | POA: Insufficient documentation

## 2015-09-09 DIAGNOSIS — F419 Anxiety disorder, unspecified: Secondary | ICD-10-CM | POA: Insufficient documentation

## 2015-09-09 LAB — RAPID STREP SCREEN (MED CTR MEBANE ONLY): STREPTOCOCCUS, GROUP A SCREEN (DIRECT): NEGATIVE

## 2015-09-09 MED ORDER — ALBUTEROL SULFATE HFA 108 (90 BASE) MCG/ACT IN AERS
2.0000 | INHALATION_SPRAY | RESPIRATORY_TRACT | Status: DC | PRN
Start: 2015-09-09 — End: 2015-09-09
  Administered 2015-09-09: 2 via RESPIRATORY_TRACT
  Filled 2015-09-09: qty 6.7

## 2015-09-09 MED ORDER — BENZONATATE 100 MG PO CAPS
100.0000 mg | ORAL_CAPSULE | Freq: Three times a day (TID) | ORAL | Status: DC
Start: 2015-09-09 — End: 2015-09-25

## 2015-09-09 NOTE — Discharge Instructions (Signed)
Return to the ED with any concerns including difficulty breathing despite using albuterol every 4 hours, not drinking fluids, decreased urine output, vomiting and not able to keep down liquids or medications, decreased level of alertness/lethargy, or any other alarming symptoms °

## 2015-09-09 NOTE — ED Notes (Signed)
C/o sore throat 2 days with bil ear pressure with cough with yellow sputum. No fever.

## 2015-09-09 NOTE — ED Provider Notes (Signed)
CSN: [id number]     Arrival date & time 09/09/15  1046 History   First MD Initiated Contact with Patient 09/09/15 1104     No chief complaint on file.    (Consider location/radiation/quality/duration/timing/severity/associated sxs/prior Treatment) HPI  Pt presenting with c/o 2 days of cough, sore throat and sinus congestion with ear pain.  No fever associated.  She has been drinking liquids well and has had no vomiting or diarrhea.  No specific sick contacts.  She has been taking OTC delsym without much relief.  There are no other associated systemic symptoms, there are no other alleviating or modifying factors.   Past Medical History  Diagnosis Date  . Bullous pemphigus   . Staph infection   . Cat bite   . 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 Situational anxiety   . PONV (postoperative nausea and vomiting)    Past Surgical History  Procedure Laterality Date  . Abdominal hysterectomy    . Appendectomy    . Skin graft    . 22 surgeries on left elbow    . Debridement and closure wound Left 07/01/2015    Procedure: LEFT ELBOW EXCISION OF WOUND WITH PRIMARY CLOSURE 2X5 CM ;  Surgeon: Peggye Form, DO;  Location: [facility];  Service: Plastics;  Laterality: Left;  . I&d extremity Left 07/08/2015    Procedure: IRRIGATION AND DEBRIDEMENT EXTREMITY, DRAINAGE OF LEFT ARM WOUND, A-CELL PLACEMENT, WOUND VAC PLACEMENT;  Surgeon: Alena Bills Dillingham, DO;  Location: [facility];  Service: Plastics;  Laterality: Left;  . Incision and drainage of wound Left 08/05/2015    Procedure: IRRIGATION AND DEBRIDEMENT LEFT ELBOW WOUND, PLACEMENT OF ACELL;  Surgeon: Peggye Form, DO;  Location: [facility];  Service: Plastics;  Laterality: Left;  . Application of a-cell of extremity Left 08/05/2015    Procedure: APPLICATION OF A-CELL OF EXTREMITY;  Surgeon: Peggye Form, DO;  Location: [facility];  Service: Plastics;  Laterality: Left;   Family History   Problem Relation Age of Onset  . Liver disease Mother   . Dementia Mother   . Cancer Mother   . Cancer Maternal Grandmother    Social History  Substance Use Topics  . Smoking status: Never Smoker   . Smokeless tobacco: Never Used  . Alcohol Use: No   OB History    No data available     Review of Systems  ROS reviewed and all otherwise negative except for mentioned in HPI    Allergies  Morphine and related; Zofran; and Droperidol  Home Medications   Prior to Admission medications   Medication Sig Start Date End Date Taking? Authorizing Provider  gabapentin (NEURONTIN) 300 MG capsule Take 2 capsules (600 mg total) by mouth 3 (three) times daily. 06/22/15  Yes [name], NP  benzonatate (TESSALON) 100 MG capsule Take 1 capsule (100 mg total) by mouth every 8 (eight) hours. 09/09/15   Jerelyn Scott, MD   BP 110/82 mmHg  Pulse 114  Temp(Src) 98.7 F (37.1 C) (Oral)  Resp 16  Ht  (1.626 m)  Wt 83.915 kg  BMI 31.74 kg/m2  SpO2 99%  Vitals reviewed Physical Exam  Physical Examination: General appearance - alert, well appearing, and in no distress Mental status - alert, oriented to person, place, and time Eyes - no conjunctival injection, no scleral icterus Mouth - OP with mild erythema, no exudate, palate symmetric, uvula midline Neck - supple, no significant adenopathy Chest -  clear to auscultation, no wheezes, rales or rhonchi, symmetric air entry, normal respiratory effort Heart - normal rate, regular rhythm, normal S1, S2, no murmurs, rubs, clicks or gallops Neurological - alert, oriented, normal speech Extremities - peripheral pulses normal, no pedal edema, no clubbing or cyanosis Skin - normal coloration and turgor, no rashes  ED Course  Procedures (including critical care time) Labs Review Labs Reviewed  RAPID STREP SCREEN (NOT AT [facility])  CULTURE, GROUP A STREP    Imaging Review No results found. I have personally reviewed and evaluated  these images and lab results as part of my medical decision-making.   EKG Interpretation None      MDM   Final diagnoses:  Viral URI with cough    Pt presenting with cough, sore throat, congestion- rapid strep is negative.  She has normal respiratory effort.  Lungs are clear.   Patient is overall nontoxic and well hydrated in appearance.  Pt given albuterol inhaler as well as tessalon perles prescription.  Discharged with strict return precautions.  Pt agreeable with plan.     Jerelyn ScottMartha Linker, MD 09/10/15 1146

## 2015-09-11 ENCOUNTER — Other Ambulatory Visit: Payer: Self-pay | Admitting: Family Medicine

## 2015-09-11 LAB — CULTURE, GROUP A STREP: Strep A Culture: NEGATIVE

## 2015-09-11 NOTE — Telephone Encounter (Signed)
Refill request for tramadol 50mg . LOV 08/13/2015. Please advise. Thanks!

## 2015-09-12 ENCOUNTER — Emergency Department
Admission: EM | Admit: 2015-09-12 | Discharge: 2015-09-12 | Disposition: A | Discharge status: Home / Self Care | Payer: No Typology Code available for payment source | Source: Home / Self Care | Attending: Family Medicine | Admitting: Family Medicine

## 2015-09-12 ENCOUNTER — Encounter: Payer: Self-pay | Admitting: Emergency Medicine

## 2015-09-12 DIAGNOSIS — R05 Cough: Secondary | ICD-10-CM

## 2015-09-12 DIAGNOSIS — R059 Cough, unspecified: Secondary | ICD-10-CM

## 2015-09-12 MED ORDER — PREDNISONE 20 MG PO TABS: ORAL_TABLET | ORAL | Status: DC

## 2015-09-12 MED ORDER — HYDROCODONE-HOMATROPINE 5-1.5 MG/5ML PO SYRP: 5.0000 mL | ORAL_SOLUTION | Freq: Four times a day (QID) | ORAL | Status: DC | PRN

## 2015-09-12 NOTE — Discharge Instructions (Signed)
Hycodan is a narcotic pain and cough medication. While taking, do not drink alcohol, drive, or perform any other activities that requires focus while taking these medications.    Cool Mist Vaporizers Vaporizers may help relieve the symptoms of a cough and cold. They add moisture to the air, which helps mucus to become thinner and less sticky. This makes it easier to breathe and cough up secretions. Cool mist vaporizers do not cause serious burns like hot mist vaporizers, which may also be called steamers or humidifiers. Vaporizers have not been proven to help with colds. You should not use a vaporizer if you are allergic to mold. HOME CARE INSTRUCTIONS  Follow the package instructions for the vaporizer.  Do not use anything other than distilled water in the vaporizer.  Do not run the vaporizer all of the time. This can cause mold or bacteria to grow in the vaporizer.  Clean the vaporizer after each time it is used.  Clean and dry the vaporizer well before storing it.  Stop using the vaporizer if worsening respiratory symptoms develop.   This information is not intended to replace advice given to you by your health care provider. Make sure you discuss any questions you have with your health care provider.   Document Released: 05/26/2004 Document Revised: 09/03/2013 Document Reviewed: 01/16/2013 Elsevier Interactive Patient Education 2016 Elsevier Inc.  Cough, Adult A cough helps to clear your throat and lungs. A cough may last only 2-3 weeks (acute), or it may last longer than 8 weeks (chronic). Many different things can cause a cough. A cough may be a sign of an illness or another medical condition. HOME CARE  Pay attention to any changes in your cough.  Take medicines only as told by your doctor.  If you were prescribed an antibiotic medicine, take it as told by your doctor. Do not stop taking it even if you start to feel better.  Talk with your doctor before you try using a cough  medicine.  Drink enough fluid to keep your pee (urine) clear or pale yellow.  If the air is dry, use a cold steam vaporizer or humidifier in your home.  Stay away from things that make you cough at work or at home.  If your cough is worse at night, try using extra pillows to raise your head up higher while you sleep.  Do not smoke, and try not to be around smoke. If you need help quitting, ask your doctor.  Do not have caffeine.  Do not drink alcohol.  Rest as needed. GET HELP IF:  You have new problems (symptoms).  You cough up yellow fluid (pus).  Your cough does not get better after 2-3 weeks, or your cough gets worse.  Medicine does not help your cough and you are not sleeping well.  You have pain that gets worse or pain that is not helped with medicine.  You have a fever.  You are losing weight and you do not know why.  You have night sweats. GET HELP RIGHT AWAY IF:  You cough up blood.  You have trouble breathing.  Your heartbeat is very fast.   This information is not intended to replace advice given to you by your health care provider. Make sure you discuss any questions you have with your health care provider.   Document Released: 05/12/2011 Document Revised: 05/20/2015 Document Reviewed: 11/05/2014 Elsevier Interactive Patient Education [employer] Elsevier Inc.

## 2015-09-12 NOTE — ED Provider Notes (Signed)
CSN: [id number]     Arrival date & time 09/12/15  1640 History   First MD Initiated Contact with Patient 09/12/15 1656     Chief Complaint  Patient presents with  . Cough   (Consider location/radiation/quality/duration/timing/severity/associated sxs/prior Treatment) HPI Pt is a 49[facility] with [facility]/o worsening dry moderate intermittent cough that keeps her up at night.  Pt states she has body aches from coughing so much.  Pt notes she was seen in the emergency department 2 days ago for URI symptoms including a sore throat and hoarse voice. She tested negative for strep throat. Her throat pain is improving.  She was given tessalon and an inhaler at that time but no relief from the cough. Pt notes she did have "left over" azithromycin from a prior Left elbow injury infection and started taking that. Pt states she does not need any more antibiotics but does want something stronger for the cough.  Pt notes she has had prednisone in the past with her inhaler too and agrees that might help. Denies fever, chills, n/v/d.   Past Medical History  Diagnosis Date  . Bullous pemphigus   . Staph infection   . Cat bite   . 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 Situational anxiety   . PONV (postoperative nausea and vomiting)    Past Surgical History  Procedure Laterality Date  . Abdominal hysterectomy    . Appendectomy    . Skin graft    . 22 surgeries on left elbow    . Debridement and closure wound Left 07/01/2015    Procedure: LEFT ELBOW EXCISION OF WOUND WITH PRIMARY CLOSURE 2X5 CM ;  Surgeon: Peggye Formlaire S Dillingham, DO;  Location: [facility];  Service: Plastics;  Laterality: Left;  . I&d extremity Left 07/08/2015    Procedure: IRRIGATION AND DEBRIDEMENT EXTREMITY, DRAINAGE OF LEFT ARM WOUND, A-CELL PLACEMENT, WOUND VAC PLACEMENT;  Surgeon: Alena Billslaire S Dillingham, DO;  Location: [facility];  Service: Plastics;  Laterality: Left;  . Incision and drainage of wound Left 08/05/2015   Procedure: IRRIGATION AND DEBRIDEMENT LEFT ELBOW WOUND, PLACEMENT OF ACELL;  Surgeon: Peggye Formlaire S Dillingham, DO;  Location: [facility];  Service: Plastics;  Laterality: Left;  . Application of a-cell of extremity Left 08/05/2015    Procedure: APPLICATION OF A-CELL OF EXTREMITY;  Surgeon: Peggye Formlaire S Dillingham, DO;  Location: [facility];  Service: Plastics;  Laterality: Left;   Family History  Problem Relation Age of Onset  . Liver disease Mother   . Dementia Mother   . Cancer Mother   . Cancer Maternal Grandmother    Social History  Substance Use Topics  . Smoking status: Never Smoker   . Smokeless tobacco: Never Used  . Alcohol Use: No   OB History    No data available     Review of Systems  Constitutional: Negative for fever and chills.  HENT: Positive for congestion, sore throat and voice change. Negative for ear pain and trouble swallowing.   Respiratory: Positive for cough. Negative for shortness of breath.   Cardiovascular: Negative for chest pain and palpitations.  Gastrointestinal: Negative for nausea, vomiting, abdominal pain and diarrhea.  Musculoskeletal: Negative for myalgias, back pain and arthralgias.  Skin: Negative for rash.    Allergies  Morphine and related; Zofran; and Droperidol  Home Medications   Prior to Admission medications   Medication Sig Start Date End Date Taking? Authorizing Provider  benzonatate (TESSALON) 100 MG capsule  Take 1 capsule (100 mg total) by mouth every 8 (eight) hours. 09/09/15   Jerelyn Scott, MD  gabapentin (NEURONTIN) 300 MG capsule Take 2 capsules (600 mg total) by mouth 3 (three) times daily. 06/22/15   [name], NP  HYDROcodone-homatropine [facility]) 5-1.5 MG/5ML syrup Take 5 mLs by mouth every 6 (six) hours as needed for cough. 09/12/15   Junius Finner, PA-C  predniSONE (DELTASONE) 20 MG tablet 3 tabs po day one, then 2 po daily x 4 days 09/12/15   Junius Finner, PA-C   Meds Ordered and  Administered this Visit  Medications - No data to display  BP 106/74 mmHg  Pulse 115  Temp(Src) 98.2 F (36.8 C) (Oral)  Ht  (1.626 m)  Wt 201 lb (91.173 kg)  BMI 34.48 kg/m2  SpO2 100% No data found.   Physical Exam  Constitutional: She appears well-developed and well-nourished. No distress.  HENT:  Head: Normocephalic and atraumatic.  Right Ear: Hearing, tympanic membrane, external ear and ear canal normal.  Left Ear: Hearing, tympanic membrane, external ear and ear canal normal.  Nose: Nose normal.  Mouth/Throat: Uvula is midline and mucous membranes are normal. Posterior oropharyngeal erythema present. No oropharyngeal exudate, posterior oropharyngeal edema or tonsillar abscesses.  Eyes: Conjunctivae are normal. No scleral icterus.  Neck: Normal range of motion. Neck supple.  Hoarse voice but no stridor  Cardiovascular: Normal rate, regular rhythm and normal heart sounds.   Pulmonary/Chest: Effort normal and breath sounds normal. No stridor. No respiratory distress. She has no wheezes. She has no rales. She exhibits no tenderness.  Dry intermittent cough during exam. Lungs: CTAB. No wheeze or rhonchi.  No respiratory distress.   Abdominal: Soft. She exhibits no distension and no mass. There is no tenderness. There is no rebound and no guarding.  Musculoskeletal: Normal range of motion.  Lymphadenopathy:    She has no cervical adenopathy.  Neurological: She is alert.  Skin: Skin is warm and dry. She is not diaphoretic.  Nursing note and vitals reviewed.   ED Course  Procedures (including critical care time)  Labs Review Labs Reviewed - No data to display  Imaging Review No results found.   MDM   1. Cough     Pt c/o persistent dry hacking cough that keeps her up at night and is now causing body aches due to the coughing fits.  Pt appears well, non-toxic. No respiratory distress on exam. O2 Sat 100% on RA. Pt notes she has already taken leftover Azithromycin.    Pt has an inhaler at home.  Rx: prednisone for 5 days, hycodan.  Advised not to drink alcohol or drive while taking. F/u with PCP in 1 week if not improving, sooner if worsening. Patient verbalized understanding and agreement with treatment plan.     Junius Finner, PA-C 09/13/15 [phone]

## 2015-09-12 NOTE — ED Notes (Signed)
Pt c/o cough worse at night x 4 days.  Pt has an inhaler and Tessalon Pearls without any relief.

## 2015-09-17 ENCOUNTER — Emergency Department (INDEPENDENT_AMBULATORY_CARE_PROVIDER_SITE_OTHER)
Admission: EM | Admit: 2015-09-17 | Discharge: 2015-09-17 | Disposition: A | Discharge status: Home / Self Care | Payer: No Typology Code available for payment source | Source: Home / Self Care | Attending: Family Medicine | Admitting: Family Medicine

## 2015-09-17 ENCOUNTER — Encounter: Payer: Self-pay | Admitting: Emergency Medicine

## 2015-09-17 DIAGNOSIS — R053 Chronic cough: Secondary | ICD-10-CM

## 2015-09-17 DIAGNOSIS — M25522 Pain in left elbow: Secondary | ICD-10-CM

## 2015-09-17 DIAGNOSIS — R05 Cough: Secondary | ICD-10-CM

## 2015-09-17 MED ORDER — OMEPRAZOLE 20 MG PO CPDR: 20.0000 mg | DELAYED_RELEASE_CAPSULE | Freq: Every day | ORAL | Status: DC

## 2015-09-17 NOTE — Discharge Instructions (Signed)

## 2015-09-17 NOTE — ED Notes (Signed)
Dry cough, sometimes productive, worse at night and can't sleep Left arm swollen and painful from lifting large water jug

## 2015-09-17 NOTE — ED Provider Notes (Signed)
CSN: [id number]     Arrival date & time 09/17/15  1300 History   First MD Initiated Contact with Patient 09/17/15 1316     Chief Complaint  Patient presents with  . Cough   (Consider location/radiation/quality/duration/timing/severity/associated sxs/prior Treatment) HPI Pt is a 50[facility] with c/o persistent intermittent dry cough that is worse at night.  Cough keeps her from sleeping.  She has already completed Azithromycin, prednisone, and has been given tessalon and hycodan.  Pt does c/o "tickle" and scratchiness in her throat that causes her to cough. Pt requesting more narcotic cough medication as that did help. Denies hx of asthma. Denies chest pain or SOB. Denies fever, chills, n/v/d.   Pt also c/o Left elbow pain with mild swelling.  Pt states she had surgery on her elbow several months ago secondary to an infected cat bite.  She had been doing well but lifted a large water jug 2 days ago causing the current pain.  Pain is aching and sore, mild to moderate in severity. Worse with palpation. Full ROM. No change in skin color.  No falls.   Past Medical History  Diagnosis Date  . Bullous pemphigus   . Staph infection   . Cat bite   . 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 Situational anxiety   . PONV (postoperative nausea and vomiting)    Past Surgical History  Procedure Laterality Date  . Abdominal hysterectomy    . Appendectomy    . Skin graft    . 22 surgeries on left elbow    . Debridement and closure wound Left 07/01/2015    Procedure: LEFT ELBOW EXCISION OF WOUND WITH PRIMARY CLOSURE 2X5 CM ;  Surgeon: Peggye Formlaire S Dillingham, DO;  Location: [facility];  Service: Plastics;  Laterality: Left;  . I&d extremity Left 07/08/2015    Procedure: IRRIGATION AND DEBRIDEMENT EXTREMITY, DRAINAGE OF LEFT ARM WOUND, A-CELL PLACEMENT, WOUND VAC PLACEMENT;  Surgeon: Alena Billslaire S Dillingham, DO;  Location: [facility];  Service: Plastics;  Laterality: Left;  . Incision and  drainage of wound Left 08/05/2015    Procedure: IRRIGATION AND DEBRIDEMENT LEFT ELBOW WOUND, PLACEMENT OF ACELL;  Surgeon: Peggye Formlaire S Dillingham, DO;  Location: [facility];  Service: Plastics;  Laterality: Left;  . Application of a-cell of extremity Left 08/05/2015    Procedure: APPLICATION OF A-CELL OF EXTREMITY;  Surgeon: Peggye Formlaire S Dillingham, DO;  Location: [facility];  Service: Plastics;  Laterality: Left;   Family History  Problem Relation Age of Onset  . Liver disease Mother   . Dementia Mother   . Cancer Mother   . Cancer Maternal Grandmother    Social History  Substance Use Topics  . Smoking status: Never Smoker   . Smokeless tobacco: Never Used  . Alcohol Use: No   OB History    No data available     Review of Systems  Constitutional: Negative for fever and chills.  HENT: Positive for congestion, rhinorrhea and voice change ( hoaseness). Negative for ear pain, sore throat and trouble swallowing.   Respiratory: Positive for cough. Negative for shortness of breath.   Cardiovascular: Negative for chest pain and palpitations.  Gastrointestinal: Negative for nausea, vomiting, abdominal pain and diarrhea.  Musculoskeletal: Positive for arthralgias. Negative for myalgias and back pain.       Left elbow  Skin: Negative for rash.    Allergies  Morphine and related; Zofran; and Droperidol  Home Medications  Prior to Admission medications   Medication Sig Start Date End Date Taking? Authorizing Provider  benzonatate (TESSALON) 100 MG capsule Take 1 capsule (100 mg total) by mouth every 8 (eight) hours. 09/09/15   Jerelyn Scott, MD  gabapentin (NEURONTIN) 300 MG capsule Take 2 capsules (600 mg total) by mouth 3 (three) times daily. 06/22/15   [name], NP  HYDROcodone-homatropine [facility]) 5-1.5 MG/5ML syrup Take 5 mLs by mouth every 6 (six) hours as needed for cough. 09/12/15   Junius Finner, PA-C  omeprazole (PRILOSEC) 20 MG capsule Take  1 capsule (20 mg total) by mouth daily. 09/17/15   Junius Finner, PA-C  predniSONE (DELTASONE) 20 MG tablet 3 tabs po day one, then 2 po daily x 4 days 09/12/15   Junius Finner, PA-C   Meds Ordered and Administered this Visit  Medications - No data to display  BP 102/73 mmHg  Pulse 84  Temp(Src) 97.4 F (36.3 C) (Oral)  Ht 5\' 4"  (1.626 m)  Wt 198 lb (89.812 kg)  BMI 33.97 kg/m2  SpO2 100% No data found.   Physical Exam  Constitutional: She appears well-developed and well-nourished. No distress.  HENT:  Head: Normocephalic and atraumatic.  Right Ear: Hearing, tympanic membrane, external ear and ear canal normal.  Left Ear: Hearing, tympanic membrane, external ear and ear canal normal.  Nose: Nose normal.  Mouth/Throat: Uvula is midline, oropharynx is clear and moist and mucous membranes are normal.  Eyes: Conjunctivae are normal. No scleral icterus.  Neck: Normal range of motion. Neck supple.  Cardiovascular: Normal rate, regular rhythm and normal heart sounds.   Pulmonary/Chest: Effort normal and breath sounds normal. No respiratory distress. She has no wheezes. She has no rales. She exhibits no tenderness.  No respiratory distress, able to speak in full sentences w/o difficulty. Lungs: CTAB   Abdominal: Soft. She exhibits no distension and no mass. There is no tenderness. There is no rebound and no guarding.  Musculoskeletal: Normal range of motion. She exhibits tenderness. She exhibits no edema.  Left elbow: well healed surgical scars. No obvious edema. Full ROM. Tenderness over musculature just distal to elbow joint. No bony tenderness.   Neurological: She is alert.  Skin: Skin is warm and dry. She is not diaphoretic.  Left elbow: well healed surgical scars. No erythema or ecchymosis.   Nursing note and vitals reviewed.   ED Course  Procedures (including critical care time)  Labs Review Labs Reviewed - No data to display  Imaging Review No results found.    MDM    1. Persistent cough for 3 weeks or longer   2. Left elbow pain    Pt c/o persistent cough.  Pt appears well, non-toxic. NAD.  O2 Sat 100% on RA.  Lungs: CTAB.   Pt c/o scratchiness/tickle in her throat that causes the cough. Doubt ACS or PE. Pt denies CP or SOB.  Pt has already completed a course of azithromycin. No evidence of bacterial infection on exam. Cough may be due to acid reflux as it is worse when she lies down and c/o throat irritation. Will try trial of omeprazole.  Reviewed [place] Controlled Substance Database. Concern for potential narcotic abuse as pt has received multiple prescriptions for controlled substances each month since July 2016 from different providers, filled at various pharmacies. Advised pt the narcotic cough medications are not intended for chronic coughing.  Strongly encourage pt establish care with PCP for ongoing healthcare needs and recheck of symptoms if not improving with  omeprazole.  Pt also c/o Left elbow pain. No evidence of underlying infection. No indication for imaging at this time given reported MOI.  Encouraged rest, ice, compression and elevation. May take acetaminophen and ibuprofen as needed for pain.  Patient verbalized understanding and agreement with treatment plan.    Junius Finner, PA-C 09/17/15 1356

## 2015-09-22 MED FILL — LORazepam 1 MG TABS: 1 | 30 days supply | Qty: 60 | Fill #0

## 2015-09-24 ENCOUNTER — Other Ambulatory Visit: Payer: Self-pay | Admitting: Family Medicine

## 2015-09-24 MED FILL — GABAPENTIN 300 MG CAPSULE: 300 | 15 days supply | Qty: 90 | Fill #3

## 2015-09-25 ENCOUNTER — Ambulatory Visit (INDEPENDENT_AMBULATORY_CARE_PROVIDER_SITE_OTHER): Payer: Self-pay

## 2015-09-25 ENCOUNTER — Ambulatory Visit (INDEPENDENT_AMBULATORY_CARE_PROVIDER_SITE_OTHER): Payer: Self-pay | Admitting: Urgent Care

## 2015-09-25 VITALS — BP 97/68 | HR 85 | Temp 98.0°F | Resp 16 | Ht 64.0 in | Wt 196.0 lb

## 2015-09-25 DIAGNOSIS — Z8739 Personal history of other diseases of the musculoskeletal system and connective tissue: Secondary | ICD-10-CM

## 2015-09-25 DIAGNOSIS — Z9889 Other specified postprocedural states: Secondary | ICD-10-CM

## 2015-09-25 DIAGNOSIS — M25522 Pain in left elbow: Secondary | ICD-10-CM

## 2015-09-25 LAB — POCT CBC
GRANULOCYTE PERCENT: 51 % (ref 37–80)
HCT, POC: 37.3 % — AB (ref 37.7–47.9)
Hemoglobin: 12.5 g/dL (ref 12.2–16.2)
Lymph, poc: 1.6 (ref 0.6–3.4)
MCH: 30.5 pg (ref 27–31.2)
MCHC: 33.6 g/dL (ref 31.8–35.4)
MCV: 90.8 fL (ref 80–97)
MID (CBC): 0.3 (ref 0–0.9)
MPV: 8.9 fL (ref 0–99.8)
PLATELET COUNT, POC: 244 10*3/uL (ref 142–424)
POC Granulocyte: 2 (ref 2–6.9)
POC LYMPH %: 40.7 % (ref 10–50)
POC MID %: 8.3 % (ref 0–12)
RBC: 4.11 M/uL (ref 4.04–5.48)
RDW, POC: 13.7 %
WBC: 3.9 10*3/uL — AB (ref 4.6–10.2)

## 2015-09-25 MED ORDER — HYDROCODONE-ACETAMINOPHEN 5-325 MG PO TABS: 1.0000 | ORAL_TABLET | Freq: Four times a day (QID) | ORAL | Status: DC | PRN

## 2015-09-25 MED ORDER — CLINDAMYCIN HCL 300 MG PO CAPS: 300.0000 mg | ORAL_CAPSULE | Freq: Four times a day (QID) | ORAL | Status: DC

## 2015-09-25 MED FILL — SERTRALINE HCL 50 MG TABLET: 50 | 30 days supply | Qty: 30 | Fill #0

## 2015-09-25 NOTE — Patient Instructions (Signed)
Bone and Joint Infections, Adult °Bone infections (osteomyelitis) and joint infections (septic arthritis) occur when bacteria or other germs get inside a bone or a joint. This can happen if you have an infection in another part of your body that spreads through your blood. Germs from your skin or from outside of your body can also cause this type of infection if you have a wound or a broken bone (fracture) that breaks the skin. °Anyone can get a bone infection or joint infection. You may be more likely to get this type of infection if you have a condition, such as diabetes, that lowers your ability to fight infection or increases your chances of getting an infection. Bone and joint infections can cause damage, and they can spread to other areas of your body. They need to be treated quickly. °CAUSES °Most bone and joint infections are caused by bacteria. They can also be caused by other germs, such as viruses and funguses. °RISK FACTORS °This condition is more likely to develop in: °· People who recently had surgery, especially bone or joint surgery. °· People who have a long-term (chronic) disease, such as: °¨ HIV (human immunodeficiency virus). °¨ Diabetes. °¨ Rheumatoid arthritis. °¨ Sickle cell anemia. °· Elderly people. °· People who take medicines that block or weaken the body's defense system (immune system). °· People who have a condition that reduces their blood flow. °· People who are on kidney dialysis. °· People who have an artificial joint. °· People who have had a joint or bone repaired with plates or screws (surgical hardware). °· People who use or abuse IV drugs. °· People who have had trauma, such as stepping on a nail. °SYMPTOMS °Symptoms vary depending on the type and location of your infection. Common symptoms of bone and joint infections include: °· Fever and chills. °· Redness and warmth. °· Swelling. °· Pain and stiffness. °· Drainage of fluid or pus near the infection. °· Weight loss and  fatigue. °· Decreased ability to use a hand or foot. °DIAGNOSIS °This condition may be diagnosed based on symptoms, medical history, a physical exam, and diagnostic tests. Tests can help to identify the cause of the infection. You may have various tests, such as: °· A sample of tissue, fluid, or blood taken to be examined under a microscope. °· A procedure to remove fluid from the infected joint with a needle (joint aspiration) for testing in a lab. °· Pus or discharge swabbed from a wound for testing to identify germs and to determine what type of medicine will kill them (culture and sensitivity). °· Blood tests to look for evidence of infection and inflammation (biomarkers). °· Imaging studies to determine how severe the bone or joint infection is. These may include: °¨ X-rays. °¨ CT scan. °¨ MRI. °¨ Bone scan. °TREATMENT °Treatment depends on the cause and type of infection. Antibiotic medicines are usually the first treatment for a bone or joint infection. Treatment with antibiotics may include: °· Getting IV antibiotics. This may be done in a hospital at first. You may have to continue IV antibiotics at home for several weeks. You may also have to take antibiotics by mouth for several weeks after that. °· Taking more than one kind of antibiotic. Treatment may start with a type of antibiotic that works against many different bacteria (broad spectrum antibiotics). IV antibiotics may be changed if tests show that another type may work better. °Other treatments may include: °· Draining fluid from the joint by placing a needle into   it (aspiration). °· Surgery to remove: °¨ Dead or dying tissue from a bone or joint. °¨ An infected artificial joint. °¨ Infected plates or screws that were used to repair a broken bone. °HOME CARE INSTRUCTIONS °· Take medicines only as directed by your health care provider. °· Take your antibiotic medicine as directed by your health care provider. Finish the antibiotic even if you start  to feel better. °· Follow instructions from your health care provider about how to take IV antibiotics at home. °· Ask your health care provider if you have any restrictions on your activities. °· Keep all follow-up visits as directed by your health care provider. This is important. °SEEK MEDICAL CARE IF: °· You have a fever or chills. °· You have redness, warmth, pain, or swelling that returns after treatment. °SEEK IMMEDIATE MEDICAL CARE IF: °· You have rapid breathing or you have trouble breathing. °· You have chest pain. °· You cannot drink fluids or make urine. °· The affected arm or leg swells, changes color, or turns blue. °  °This information is not intended to replace advice given to you by your health care provider. Make sure you discuss any questions you have with your health care provider. °  °Document Released: 08/29/2005 Document Revised: 01/13/2015 Document Reviewed: 08/27/2014 °Elsevier Interactive Patient Education ©2016 [employer] ° °

## 2015-09-25 NOTE — Progress Notes (Signed)
MRN: [id number] DOB: 04/14/1966  Subjective:   Sabrina [name] is a 50 y.o. female with pmh of osteomyelitis secondary to cat bite presenting for chief complaint of Arm Pain  Reports 2 day history of worsening left elbow pain. Has tried Ultram, ibuprofen with minimal relief. Her elbow pain has become more constant and severe, kept her awake last night and has radiated into her left forearm. Patient has an extensive history of multiple left arm/elbow surgeries following a cat bite. She has had staph and wound infections, surgical grafts. Her last episode of osteomyelitis was in 06/2015. Today, she denies fever, redness, swelling, streaking.  Sabrina [name] has a current medication list which includes the following prescription(s): gabapentin and tramadol. Also is allergic to morphine and related; tessalon; zofran; and droperidol.  Sabrina [name]  has a past medical history of Bullous pemphigus; Staph infection; Cat bite; 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HCC); Situational anxiety; PONV (postoperative nausea and vomiting); and Arthritis. Also  has past surgical history that includes Abdominal hysterectomy; Appendectomy; Skin graft; 22 surgeries on left elbow; Debridement and closure wound (Left, 07/01/2015); I&D extremity (Left, 07/08/2015); Incision and drainage of wound (Left, 08/05/2015); and Application of a-cell of extremity (Left, 08/05/2015).  Objective:   Vitals: BP 97/68 mmHg  Pulse 85  Temp(Src) 98 F (36.7 C) (Oral)  Resp 16  Ht 5\' 4"  (1.626 m)  Wt 196 lb (88.905 kg)  BMI 33.63 kg/m2  SpO2 96%  Physical Exam  Constitutional: She is oriented to person, place, and time. She appears well-developed and well-nourished.  Cardiovascular: Normal rate.   Pulmonary/Chest: Effort normal.  Musculoskeletal:       Left elbow: She exhibits decreased range of motion (flexion and extension) and swelling (trace edema over olecranon process). Tenderness found. Lateral epicondyle and olecranon process tenderness noted. No  radial head and no medial epicondyle tenderness noted.       Arms: Neurological: She is alert and oriented to person, place, and time.  Skin: Skin is warm and dry. No rash noted. No erythema. No pallor.    Results for orders placed or performed in visit on 09/25/15 (from the past 24 hour(s))  POCT CBC     Status: Abnormal   Collection Time: 09/25/15  6:32 PM  Result Value Ref Range   WBC 3.9 (A) 4.6 - 10.2 K/uL   Lymph, poc 1.6 0.6 - 3.4   POC LYMPH PERCENT 40.7 10 - 50 %L   MID (cbc) 0.3 0 - 0.9   POC MID % 8.3 0 - 12 %M   POC Granulocyte 2.0 2 - 6.9   Granulocyte percent 51.0 37 - 80 %G   RBC 4.11 4.04 - 5.48 M/uL   Hemoglobin 12.5 12.2 - 16.2 g/dL   HCT, POC 40.937.3 (A) 81.137.7 - 47.9 %   MCV 90.8 80 - 97 fL   MCH, POC 30.5 27 - 31.2 pg   MCHC 33.6 31.8 - 35.4 g/dL   RDW, POC 91.413.7 %   Platelet Count, POC 244 142 - 424 K/uL   MPV 8.9 0 - 99.8 fL   [facility] reading (PRIMARY) by  Dr. Patsy Lageropland and PA-Rajesh Wyss. Left elbow - no obvious signs of acute process.  Dg Elbow Complete Left  09/26/2015  CLINICAL DATA:  Left elbow pain.  History of elbow surgeries. EXAM: LEFT ELBOW - COMPLETE 3+ VIEW COMPARISON:  None. FINDINGS: There is no evidence of fracture, dislocation, or joint effusion. There is no evidence of arthropathy or other focal bone abnormality. Soft tissues are  unremarkable. IMPRESSION: Negative. Electronically Signed   By: Amie Portland M.D.   On: 09/26/2015 09:55   Assessment and Plan :   This case was precepted with Dr. Patsy Lager.  1. Left elbow pain 2. History of osteomyelitis 3. History of elbow surgery - Based off her previous wound culture and current recommendations of UpToDate, will start Clindamycin. Patient to recheck in 2-3 days if no improvement. Percocet for pain. Patient agreed.  Wallis Bamberg, PA-C [facility] [phone] 09/25/2015 5:57 PM

## 2015-09-27 ENCOUNTER — Telehealth: Payer: Self-pay | Admitting: *Deleted

## 2015-09-27 NOTE — Telephone Encounter (Signed)
I agree with the documentation Sabrina [name]. Thank you!

## 2015-09-27 NOTE — Telephone Encounter (Signed)
Pt called stating the Hydrocodone was making her sleepy and feeling sort of weird.  She states the pain medication is a little strong and would like to have something else for pain.  Maybe something that is not as strong.  I advised pt to take half a pill or stop taking the pain meds and just take extra strength tylenol.  But do not combine them.  She understood.  I also advised the pt I would send a message to the provider that saw her.

## 2015-09-30 ENCOUNTER — Telehealth: Payer: Self-pay

## 2015-09-30 NOTE — Telephone Encounter (Signed)
Patient was adsived to call back if her bone infection got worse. She states that she is in a lot more pain and was told that maybe we can send her somewhere to be seen.

## 2015-09-30 NOTE — Telephone Encounter (Signed)
Please advise 

## 2015-09-30 NOTE — Telephone Encounter (Signed)
Pt called back again. Advised that if she felt like she was getting worse to RTC. Pt agreeable.

## 2015-10-01 ENCOUNTER — Ambulatory Visit: Payer: Self-pay

## 2015-10-01 ENCOUNTER — Other Ambulatory Visit: Payer: Self-pay | Admitting: Family Medicine

## 2015-10-01 DIAGNOSIS — Z1231 Encounter for screening mammogram for malignant neoplasm of breast: Secondary | ICD-10-CM

## 2015-10-01 MED FILL — GABAPENTIN 300 MG CAPSULE: 300 | 30 days supply | Qty: 180 | Fill #0

## 2015-10-01 MED FILL — traMADol HCL 50 MG TABS: 50 | 5 days supply | Qty: 20 | Fill #0

## 2015-10-02 ENCOUNTER — Other Ambulatory Visit: Payer: Self-pay | Admitting: Urgent Care

## 2015-10-02 DIAGNOSIS — Z91199 Patient's noncompliance with other medical treatment and regimen due to unspecified reason: Secondary | ICD-10-CM

## 2015-10-02 DIAGNOSIS — Z9119 Patient's noncompliance with other medical treatment and regimen: Secondary | ICD-10-CM

## 2015-10-03 ENCOUNTER — Emergency Department (HOSPITAL_COMMUNITY)
Admission: EM | Admit: 2015-10-03 | Discharge: 2015-10-03 | Disposition: A | Discharge status: Home / Self Care | Payer: Self-pay | Attending: Physician Assistant | Admitting: Physician Assistant

## 2015-10-03 ENCOUNTER — Emergency Department (HOSPITAL_COMMUNITY): Payer: Self-pay

## 2015-10-03 ENCOUNTER — Encounter (HOSPITAL_COMMUNITY): Payer: Self-pay | Admitting: *Deleted

## 2015-10-03 DIAGNOSIS — Z8619 Personal history of other infectious and parasitic diseases: Secondary | ICD-10-CM | POA: Insufficient documentation

## 2015-10-03 DIAGNOSIS — Z9889 Other specified postprocedural states: Secondary | ICD-10-CM | POA: Insufficient documentation

## 2015-10-03 DIAGNOSIS — M25522 Pain in left elbow: Secondary | ICD-10-CM | POA: Insufficient documentation

## 2015-10-03 DIAGNOSIS — Z87828 Personal history of other (healed) physical injury and trauma: Secondary | ICD-10-CM | POA: Insufficient documentation

## 2015-10-03 DIAGNOSIS — F419 Anxiety disorder, unspecified: Secondary | ICD-10-CM | POA: Insufficient documentation

## 2015-10-03 DIAGNOSIS — Z79899 Other long term (current) drug therapy: Secondary | ICD-10-CM | POA: Insufficient documentation

## 2015-10-03 DIAGNOSIS — M1990 Unspecified osteoarthritis, unspecified site: Secondary | ICD-10-CM | POA: Insufficient documentation

## 2015-10-03 DIAGNOSIS — Z872 Personal history of diseases of the skin and subcutaneous tissue: Secondary | ICD-10-CM | POA: Insufficient documentation

## 2015-10-03 LAB — COMPREHENSIVE METABOLIC PANEL
ALT: 16 U/L (ref 14–54)
AST: 21 U/L (ref 15–41)
Albumin: 4.1 g/dL (ref 3.5–5.0)
Alkaline Phosphatase: 115 U/L (ref 38–126)
Anion gap: 10 (ref 5–15)
BUN: 16 mg/dL (ref 6–20)
CO2: 26 mmol/L (ref 22–32)
CREATININE: 0.96 mg/dL (ref 0.44–1.00)
Calcium: 9 mg/dL (ref 8.9–10.3)
Chloride: 106 mmol/L (ref 101–111)
Glucose, Bld: 87 mg/dL (ref 65–99)
POTASSIUM: 3.4 mmol/L — AB (ref 3.5–5.1)
Sodium: 142 mmol/L (ref 135–145)
TOTAL PROTEIN: 7.1 g/dL (ref 6.5–8.1)
Total Bilirubin: 0.7 mg/dL (ref 0.3–1.2)

## 2015-10-03 LAB — CBC WITH DIFFERENTIAL/PLATELET
BASOS ABS: 0 10*3/uL (ref 0.0–0.1)
Basophils Relative: 0 %
EOS ABS: 0.1 10*3/uL (ref 0.0–0.7)
Eosinophils Relative: 2 %
HCT: 35.8 % — ABNORMAL LOW (ref 36.0–46.0)
Hemoglobin: 12 g/dL (ref 12.0–15.0)
LYMPHS PCT: 52 %
Lymphs Abs: 1.9 10*3/uL (ref 0.7–4.0)
MCH: 31 pg (ref 26.0–34.0)
MCHC: 33.5 g/dL (ref 30.0–36.0)
MCV: 92.5 fL (ref 78.0–100.0)
Monocytes Absolute: 0.4 10*3/uL (ref 0.1–1.0)
Monocytes Relative: 11 %
NEUTROS PCT: 35 %
Neutro Abs: 1.3 10*3/uL — ABNORMAL LOW (ref 1.7–7.7)
PLATELETS: 218 10*3/uL (ref 150–400)
RBC: 3.87 MIL/uL (ref 3.87–5.11)
RDW: 13.2 % (ref 11.5–15.5)
WBC: 3.6 10*3/uL — AB (ref 4.0–10.5)

## 2015-10-03 LAB — I-STAT CG4 LACTIC ACID, ED: Lactic Acid, Venous: 0.86 mmol/L (ref 0.5–2.0)

## 2015-10-03 MED ORDER — PROMETHAZINE HCL 25 MG PO TABS: 25.0000 mg | ORAL_TABLET | Freq: Four times a day (QID) | ORAL | Status: DC | PRN

## 2015-10-03 MED ORDER — OXYCODONE-ACETAMINOPHEN 5-325 MG PO TABS
2.0000 | ORAL_TABLET | Freq: Once | ORAL | Status: AC
  Administered 2015-10-03: 2 via ORAL
  Filled 2015-10-03: qty 2

## 2015-10-03 MED ORDER — OXYCODONE-ACETAMINOPHEN 5-325 MG PO TABS
2.0000 | ORAL_TABLET | Freq: Once | ORAL | Status: DC
Start: 2015-10-03 — End: 2015-10-03

## 2015-10-03 MED ORDER — GADOBENATE DIMEGLUMINE 529 MG/ML IV SOLN
20.0000 mL | Freq: Once | INTRAVENOUS | Status: AC | PRN
  Administered 2015-10-03: 19 mL via INTRAVENOUS

## 2015-10-03 MED ORDER — ONDANSETRON 4 MG PO TBDP: 4.0000 mg | ORAL_TABLET | Freq: Once | ORAL | Status: DC

## 2015-10-03 MED ORDER — OXYCODONE-ACETAMINOPHEN 5-325 MG PO TABS
1.0000 | ORAL_TABLET | ORAL | Status: DC | PRN
Start: 2015-10-03 — End: 2015-11-22

## 2015-10-03 MED ORDER — PROMETHAZINE HCL 25 MG PO TABS
25.0000 mg | ORAL_TABLET | ORAL | Status: AC
  Administered 2015-10-03: 25 mg via ORAL
  Filled 2015-10-03: qty 1

## 2015-10-03 NOTE — Discharge Instructions (Signed)
Take the prescribed medication as directed. Follow-up with orthopedics. Recommend to call Dr. Nilsa Nutting office first (general ortho).  If he cannot do elbow, may need hand surgery-- Dr. Mina Marble. Return to the ED for new or worsening symptoms.

## 2015-10-03 NOTE — ED Notes (Signed)
We have had much difficulty with IV access/lab draw.  Our C.N., Darl Pikes is attempting u/s IV insertion as I write this.

## 2015-10-03 NOTE — ED Notes (Signed)
Per pt report: pt c/o left arm pain that has progressively become worse.  Pt hx of osteomyelitis in left arm. Pt has seen PCP and gave pt a prescription for Norco. Initially the pain medication helped the pain but the medication is no longer effective.  Pt's PCP advised pt to come to ED for evaluation for return on osteomyelitis.  Pt's left arm is warm to the touch, has a +2 radial pulse, and cap refill <3.  Pt has full movement of the arm.

## 2015-10-03 NOTE — ED Provider Notes (Signed)
CSN: [id number]     Arrival date & time 10/03/15  1346 History   First MD Initiated Contact with Patient 10/03/15 1652     Chief Complaint  Patient presents with  . Arm Pain     (Consider location/radiation/quality/duration/timing/severity/associated sxs/prior Treatment) Patient is a 50 y.o. female presenting with arm pain. The history is provided by the patient and medical records.  Arm Pain Associated symptoms include arthralgias.    50 y.o. F with hx of osteomyelitis of left elbow secondary to cat bat and staph infection, anxiety, presenting to the ED for left elbow pain.  Patient familiar to myself from prior visit due to post-op complications.  Patient has had numerous debridements of her left elbow as well as multiple skin grafts.  Most recent skin graft has healed completely, was released from plastic surgery.  Patient states she was doing well until about a week ago when pain in her left elbow returned.  She states it is a aching pain deep within her elbow.  She states it feels like when they first discovered her osteomyelitis last time.  Denies numbness or weakness.  She states she was seen at urgent care last week, had plain films done which were normal and was started on clindamycin for concern of infection.  Patient denies fever/chills at home.  She states she has been nauseated for the past 2 days, no vomiting.  Has been taking home norco as well, states no longer helping her pain.  VSS.  Past Medical History  Diagnosis Date  . Bullous pemphigus   . Staph infection   . Cat bite   . 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 Situational anxiety   . PONV (postoperative nausea and vomiting)   . Arthritis    Past Surgical History  Procedure Laterality Date  . Abdominal hysterectomy    . Appendectomy    . Skin graft    . 22 surgeries on left elbow    . Debridement and closure wound Left 07/01/2015    Procedure: LEFT ELBOW EXCISION OF WOUND WITH PRIMARY CLOSURE 2X5 CM ;  Surgeon: Peggye Form, DO;  Location: [facility];  Service: Plastics;  Laterality: Left;  . I&d extremity Left 07/08/2015    Procedure: IRRIGATION AND DEBRIDEMENT EXTREMITY, DRAINAGE OF LEFT ARM WOUND, A-CELL PLACEMENT, WOUND VAC PLACEMENT;  Surgeon: Alena Bills Dillingham, DO;  Location: [facility];  Service: Plastics;  Laterality: Left;  . Incision and drainage of wound Left 08/05/2015    Procedure: IRRIGATION AND DEBRIDEMENT LEFT ELBOW WOUND, PLACEMENT OF ACELL;  Surgeon: Peggye Form, DO;  Location: [facility];  Service: Plastics;  Laterality: Left;  . Application of a-cell of extremity Left 08/05/2015    Procedure: APPLICATION OF A-CELL OF EXTREMITY;  Surgeon: Peggye Form, DO;  Location: [facility];  Service: Plastics;  Laterality: Left;   Family History  Problem Relation Age of Onset  . Liver disease Mother   . Dementia Mother   . Cancer Mother   . Cancer Maternal Grandmother    Social History  Substance Use Topics  . Smoking status: Never Smoker   . Smokeless tobacco: Never Used  . Alcohol Use: No   OB History    No data available     Review of Systems  Musculoskeletal: Positive for arthralgias.  All other systems reviewed and are negative.     Allergies  Morphine and related; Tessalon; Zofran; and Droperidol  Home Medications  Prior to Admission medications   Medication Sig Start Date End Date Taking? Authorizing Provider  clindamycin (CLEOCIN) 300 MG capsule Take 1 capsule (300 mg total) by mouth 4 (four) times daily. Patient taking differently: Take 300 mg by mouth 4 (four) times daily. Started 01/13 for 10 days 09/25/15  Yes Wallis Bamberg, PA-C  clonazePAM (KLONOPIN) 1 MG tablet Take 1 mg by mouth 2 (two) times daily.   Yes Historical Provider, MD  gabapentin (NEURONTIN) 300 MG capsule TAKE 2 CAPSULES BY MOUTH 3 TIMES DAILY Patient taking differently: TAKE  BY MOUTH 3 TIMES DAILY 09/24/15  Yes [name], NP   HYDROcodone-acetaminophen (NORCO) 5-325 MG tablet Take 1 tablet by mouth every 6 (six) hours as needed. Patient taking differently: Take 1 tablet by mouth every 6 (six) hours as needed for moderate pain.  09/25/15  Yes Wallis Bamberg, PA-C  ibuprofen (ADVIL,MOTRIN) 200 MG tablet Take 400 mg by mouth every 6 (six) hours as needed for headache or moderate pain.   Yes Historical Provider, MD  sertraline (ZOLOFT) 50 MG tablet Take 50 mg by mouth daily.   Yes Historical Provider, MD   BP 104/78 mmHg  Pulse 78  Temp(Src) 98.1 F (36.7 C) (Oral)  Resp 16  SpO2 100%   Physical Exam  Constitutional: She is oriented to person, place, and time. She appears well-developed and well-nourished. No distress.  HENT:  Head: Normocephalic and atraumatic.  Mouth/Throat: Oropharynx is clear and moist.  Eyes: Conjunctivae and EOM are normal. Pupils are equal, round, and reactive to light.  Neck: Normal range of motion. Neck supple.  Cardiovascular: Normal rate, regular rhythm and normal heart sounds.   Pulmonary/Chest: Effort normal and breath sounds normal. No respiratory distress. She has no wheezes.  Musculoskeletal: Normal range of motion.  Well healed incision along dorsal left elbow; no overlying skin changes, induration, or drainage; no swelling; full ROM of left elbow; strong radial pulse and cap refill; normal grip strength; normal sensation throughout  Neurological: She is alert and oriented to person, place, and time.  Skin: Skin is warm and dry. She is not diaphoretic.  Psychiatric: She has a normal mood and affect.  Nursing note and vitals reviewed.   ED Course  Procedures (including critical care time) Labs Review Labs Reviewed  COMPREHENSIVE METABOLIC PANEL - Abnormal; Notable for the following:    Potassium 3.4 (*)    All other components within normal limits  CBC WITH DIFFERENTIAL/PLATELET - Abnormal; Notable for the following:    WBC 3.6 (*)    HCT 35.8 (*)    Neutro Abs 1.3 (*)     All other components within normal limits  I-STAT CG4 LACTIC ACID, ED    Imaging Review Mr Elbow Left W Wo Contrast  10/03/2015  CLINICAL DATA:  Evaluate for possible osteomyelitis. Multiple surgeries of the left elbow after infection from cat bite. EXAM: MRI OF THE LEFT ELBOW WITHOUT AND WITH CONTRAST TECHNIQUE: Multiplanar, multisequence MR imaging of the elbow was performed before and after the administration of intravenous contrast. CONTRAST:  19mL MULTIHANCE GADOBENATE DIMEGLUMINE 529 MG/ML IV SOLN COMPARISON:  None. FINDINGS: TENDONS Common forearm flexor origin: Intact. Common forearm extensor origin: Intact. Biceps: Intact. Triceps: Intact. LIGAMENTS Medial stabilizers: Intact. Lateral stabilizers:  Intact. Cartilage: No chondral defect. Joint: No joint effusion Cubital tunnel: Normal. Bones: No marrow signal abnormality.  No fracture or dislocation. Soft tissue: Postsurgical/post inflammatory changes in the posterior-lateral subcutaneous fat around the elbow. No drainable fluid collection. IMPRESSION: 1.  No osteomyelitis of the left elbow. 2. Postsurgical/post inflammatory changes in the posterior-lateral subcutaneous fat around the elbow. No drainable fluid collection to suggest an abscess. Electronically Signed   By: Elige Ko   On: 10/03/2015 20:12   I have personally reviewed and evaluated these images and lab results as part of my medical decision-making.   EKG Interpretation None      MDM   Final diagnoses:  Elbow pain, left   50 y.o. F with with recurrent left elbow pain.  Hx of osteomyelitis with multiple surgeries and skin grafts.  Patient is afebrile, nontoxic. Her left dorsal elbow incision is completely healed. She has no overlying skin changes, induration, or drainage. No significant swelling. She maintains full range of motion of the left elbow. I do not appreciate any joint effusion on exam. No clinical signs of DVT, no hx of same.  She has normal grip strength and arm  is neurovascularly intact. She is alert he had plain films performed which were negative for any gas. She was sent here due to concern of recurrent osteomyelitis given her current pain. We'll plan for labs and MRI of the left elbow. Patient was given Percocet and Phenergan for symptom control.  8:27 PM MRI without evidence of osteomyelitis or abscess.  Patient remains stable.  Pain currently controlled with percocet, will d/c home with same.  Patient will be referred to orthopedics and/or hand surgery for any ongoing issues with the elbow. Discussed plan with patient, he/she acknowledged understanding and agreed with plan of care.  Return precautions given for new or worsening symptoms.  Garlon Hatchet, PA-C 10/03/15 2143  Courteney Randall An, MD 10/03/15 2229

## 2015-10-03 NOTE — ED Notes (Signed)
Unable to obtain lab draw from left a/c or right hand

## 2015-10-03 NOTE — ED Notes (Signed)
Blood draw delayed, Pt at MR

## 2015-10-04 ENCOUNTER — Telehealth (HOSPITAL_BASED_OUTPATIENT_CLINIC_OR_DEPARTMENT_OTHER): Payer: Self-pay | Admitting: Emergency Medicine

## 2015-10-05 DIAGNOSIS — Z9119 Patient's noncompliance with other medical treatment and regimen: Secondary | ICD-10-CM | POA: Insufficient documentation

## 2015-10-05 DIAGNOSIS — Z91199 Patient's noncompliance with other medical treatment and regimen due to unspecified reason: Secondary | ICD-10-CM | POA: Insufficient documentation

## 2015-10-19 MED FILL — SERTRALINE HCL 50 MG TABLET: 50 | 30 days supply | Qty: 30 | Fill #1

## 2015-10-20 MED FILL — LORazepam 1 MG TABS: 1 | 30 days supply | Qty: 60 | Fill #1

## 2015-10-23 ENCOUNTER — Ambulatory Visit (INDEPENDENT_AMBULATORY_CARE_PROVIDER_SITE_OTHER): Payer: Self-pay | Admitting: Emergency Medicine

## 2015-10-23 VITALS — BP 106/64 | HR 78 | Temp 98.1°F | Resp 16 | Ht 64.0 in | Wt 195.4 lb

## 2015-10-23 DIAGNOSIS — F1920 Other psychoactive substance dependence, uncomplicated: Secondary | ICD-10-CM

## 2015-10-23 DIAGNOSIS — M25522 Pain in left elbow: Secondary | ICD-10-CM

## 2015-10-23 NOTE — Patient Instructions (Addendum)
You can call the [facility] 24-hour HelpLine: [phone] or [phone]. I have made a referral to pain management. I have made a referral for you to see an orthopedist.

## 2015-10-23 NOTE — Progress Notes (Signed)
By signing my name below, I, Stann Ore, attest that this documentation has been prepared under the direction and in the presence of Lesle Chris, MD. Electronically Signed: Stann Ore, Scribe. 10/23/2015 , 11:29 AM .  Patient was seen in room 6 .  Chief Complaint:  Chief Complaint  Patient presents with  . Elbow Pain    Left elbow    HPI: Sabrina [name] is a 50 y.o. female who reports to [facility] today complaining of left elbow pain from a cat bite while at work back in Jul 03, 2014. She's had 26 surgeries in her elbow. She had a MRI 3 weeks ago and they found pockets in infections. She had some bone loss. Her previous surgeries were done in [place]. Her last surgery was done by Dr. Kelly Splinter. She was prescribed ultram and ibuprofen for relief in [place].   She recently moved here in June 2016. She's applying to jobs at the moment.   Past Medical History  Diagnosis Date  . Bullous pemphigus   . Staph infection   . Cat bite   . 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 Situational anxiety   . PONV (postoperative nausea and vomiting)   . Arthritis    Past Surgical History  Procedure Laterality Date  . Abdominal hysterectomy    . Appendectomy    . Skin graft    . 22 surgeries on left elbow    . Debridement and closure wound Left 07/01/2015    Procedure: LEFT ELBOW EXCISION OF WOUND WITH PRIMARY CLOSURE 2X5 CM ;  Surgeon: Peggye Form, DO;  Location: [facility];  Service: Plastics;  Laterality: Left;  . I&d extremity Left 07/08/2015    Procedure: IRRIGATION AND DEBRIDEMENT EXTREMITY, DRAINAGE OF LEFT ARM WOUND, A-CELL PLACEMENT, WOUND VAC PLACEMENT;  Surgeon: Alena Bills Dillingham, DO;  Location: [facility];  Service: Plastics;  Laterality: Left;  . Incision and drainage of wound Left 08/05/2015    Procedure: IRRIGATION AND DEBRIDEMENT LEFT ELBOW WOUND, PLACEMENT OF ACELL;  Surgeon: Peggye Form, DO;  Location: [facility];  Service: Plastics;   Laterality: Left;  . Application of a-cell of extremity Left 08/05/2015    Procedure: APPLICATION OF A-CELL OF EXTREMITY;  Surgeon: Peggye Form, DO;  Location: [facility];  Service: Plastics;  Laterality: Left;   Social History   Social History  . Marital Status: Single    Spouse Name: N/A  . Number of Children: N/A  . Years of Education: N/A   Social History Main Topics  . Smoking status: Never Smoker   . Smokeless tobacco: Never Used  . Alcohol Use: No  . Drug Use: No  . Sexual Activity: Not Asked   Other Topics Concern  . None   Social History Narrative   Family History  Problem Relation Age of Onset  . Liver disease Mother   . Dementia Mother   . Cancer Mother   . Cancer Maternal Grandmother    Allergies  Allergen Reactions  . Morphine And Related Itching  . Tessalon [Benzonatate] Other (See Comments)    Watery eyes  . Zofran [Ondansetron Hcl]     Spots around iv site  . Droperidol Palpitations   Prior to Admission medications   Medication Sig Start Date End Date Taking? Authorizing Provider  gabapentin (NEURONTIN) 300 MG capsule TAKE 2 CAPSULES BY MOUTH 3 TIMES DAILY Patient taking differently: TAKE 600mg  BY MOUTH 3 TIMES DAILY 09/24/15  Yes  [name], NP  ibuprofen (ADVIL,MOTRIN) 200 MG tablet Take 400 mg by mouth every 6 (six) hours as needed for headache or moderate pain.   Yes Historical Provider, MD  sertraline (ZOLOFT) 50 MG tablet Take 50 mg by mouth daily.   Yes Historical Provider, MD  clindamycin (CLEOCIN) 300 MG capsule Take 1 capsule (300 mg total) by mouth 4 (four) times daily. Patient not taking: Reported on 10/23/2015 09/25/15   Wallis Bamberg, PA-C  clonazePAM (KLONOPIN) 1 MG tablet Take 1 mg by mouth 2 (two) times daily. Reported on 10/23/2015    Historical Provider, MD  HYDROcodone-acetaminophen (NORCO) 5-325 MG tablet Take 1 tablet by mouth every 6 (six) hours as needed. Patient not taking: Reported on 10/23/2015 09/25/15    Wallis Bamberg, PA-C  oxyCODONE-acetaminophen (PERCOCET/ROXICET) 5-325 MG tablet Take 1 tablet by mouth every 4 (four) hours as needed. Patient not taking: Reported on 10/23/2015 10/03/15   Garlon Hatchet, PA-C  promethazine (PHENERGAN) 25 MG tablet Take 1 tablet (25 mg total) by mouth every 6 (six) hours as needed for nausea or vomiting. Patient not taking: Reported on 10/23/2015 10/03/15   Garlon Hatchet, PA-C     ROS:  Constitutional: negative for fever, chills, night sweats, weight changes, or fatigue  HEENT: negative for vision changes, hearing loss, congestion, rhinorrhea, ST, epistaxis, or sinus pressure Cardiovascular: negative for chest pain or palpitations Respiratory: negative for hemoptysis, wheezing, shortness of breath, or cough Abdominal: negative for abdominal pain, nausea, vomiting, diarrhea, or constipation Dermatological: negative for rash Musc: positive for arthralgia (left elbow) Neurologic: negative for headache, dizziness, or syncope All other systems reviewed and are otherwise negative with the exception to those above and in the HPI.  PHYSICAL EXAM: Filed Vitals:   10/23/15 1105  BP: 106/64  Pulse: 78  Temp: 98.1 F (36.7 C)  Resp: 16   Body mass index is 33.52 kg/(m^2).   General: Alert, no acute distress HEENT:  Normocephalic, atraumatic, oropharynx patent. Eye: Nonie Hoyer [facility] Cardiovascular:  Regular rate and rhythm, no rubs murmurs or gallops.  No Carotid bruits, radial pulse intact. No pedal edema.  Respiratory: Clear to auscultation bilaterally.  No wheezes, rales, or rhonchi.  No cyanosis, no use of accessory musculature Abdominal: No organomegaly, abdomen is soft and non-tender, positive bowel sounds. No masses. Musculoskeletal: Gait intact. Left elbow: Multiple scars over left elbow, no erythema, good rom Skin: No rashes. Neurologic: Facial musculature symmetric. Psychiatric: Patient acts appropriately throughout our interaction.  Lymphatic: No  cervical or submandibular lymphadenopathy Genitourinary/Anorectal: No acute findings  LABS:   EKG/XRAY:   Primary read interpreted by Dr. Cleta Alberts at [facility].   ASSESSMENT/PLAN: I pulled up the [place] controlled substance report and there are multiple pain medication prescriptions from multiple providers. I told her I did not write for any pain medications. I told her I would make referral to pain management as well as referrals to [facility] orthopedics which is associated with [facility]. I also told her I would call [facility] and get directions for her to go to that office and talk to them regarding her use of pain medications.I personally performed the services described in this documentation, which was scribed in my presence. The recorded information has been reviewed and is accurate.   Gross sideeffects, risk and benefits, and alternatives of medications d/w patient. Patient is aware that all medications have potential sideeffects and we are unable to predict every sideeffect or drug-drug interaction that may occur.  Lesle Chris MD  10/23/2015 11:29 AM

## 2015-10-26 MED FILL — GABAPENTIN 300 MG CAPSULE: 300 | 30 days supply | Qty: 180 | Fill #1

## 2015-10-28 ENCOUNTER — Telehealth: Payer: Self-pay | Admitting: Family Medicine

## 2015-10-28 ENCOUNTER — Ambulatory Visit: Payer: Self-pay | Admitting: Family Medicine

## 2015-10-29 NOTE — Telephone Encounter (Signed)
Called patient and advised we would not be able to refill this. Thanks!

## 2015-11-13 ENCOUNTER — Emergency Department (HOSPITAL_COMMUNITY)
Admission: EM | Admit: 2015-11-13 | Discharge: 2015-11-13 | Discharge status: Against Medical Advice | Payer: Self-pay | Source: Home / Self Care | Attending: Family Medicine | Admitting: Family Medicine

## 2015-11-13 MED FILL — SERTRALINE HCL 50 MG TABLET: 50 | 30 days supply | Qty: 30 | Fill #2

## 2015-11-13 NOTE — ED Notes (Signed)
Waiting room check x3 no answer

## 2015-11-17 MED FILL — LORazepam 1 MG TABS: 1 | 30 days supply | Qty: 60 | Fill #2

## 2015-11-19 ENCOUNTER — Emergency Department (HOSPITAL_COMMUNITY): Payer: Self-pay

## 2015-11-19 ENCOUNTER — Emergency Department (HOSPITAL_COMMUNITY)
Admission: EM | Admit: 2015-11-19 | Discharge: 2015-11-19 | Disposition: A | Discharge status: Home / Self Care | Payer: Self-pay | Attending: Emergency Medicine | Admitting: Emergency Medicine

## 2015-11-19 ENCOUNTER — Encounter (HOSPITAL_COMMUNITY): Payer: Self-pay | Admitting: Emergency Medicine

## 2015-11-19 DIAGNOSIS — Z8619 Personal history of other infectious and parasitic diseases: Secondary | ICD-10-CM | POA: Insufficient documentation

## 2015-11-19 DIAGNOSIS — R69 Illness, unspecified: Secondary | ICD-10-CM

## 2015-11-19 DIAGNOSIS — Z872 Personal history of diseases of the skin and subcutaneous tissue: Secondary | ICD-10-CM | POA: Insufficient documentation

## 2015-11-19 DIAGNOSIS — J111 Influenza due to unidentified influenza virus with other respiratory manifestations: Secondary | ICD-10-CM | POA: Insufficient documentation

## 2015-11-19 DIAGNOSIS — Z79899 Other long term (current) drug therapy: Secondary | ICD-10-CM | POA: Insufficient documentation

## 2015-11-19 DIAGNOSIS — F419 Anxiety disorder, unspecified: Secondary | ICD-10-CM | POA: Insufficient documentation

## 2015-11-19 DIAGNOSIS — M1990 Unspecified osteoarthritis, unspecified site: Secondary | ICD-10-CM | POA: Insufficient documentation

## 2015-11-19 MED ORDER — PROMETHAZINE-DM 6.25-15 MG/5ML PO SYRP: 5.0000 mL | ORAL_SOLUTION | Freq: Four times a day (QID) | ORAL | Status: DC | PRN

## 2015-11-19 MED ORDER — PREDNISONE 50 MG PO TABS: 50.0000 mg | ORAL_TABLET | Freq: Every day | ORAL | Status: DC

## 2015-11-19 MED ORDER — GUAIFENESIN ER 1200 MG PO TB12: 1.0000 | ORAL_TABLET | Freq: Two times a day (BID) | ORAL | Status: DC

## 2015-11-19 MED FILL — PROMETHAZINE-DM SYRUP: 6.25-15 | 4 days supply | Qty: 120 | Fill #0

## 2015-11-19 NOTE — ED Notes (Signed)
Pt c/o of cough and fever that makes her unable to sleep.  Cough starts dry then becomes productive.

## 2015-11-19 NOTE — ED Provider Notes (Signed)
CSN: [id number]     Arrival date & time 11/19/15  1103 History   First MD Initiated Contact with Patient 11/19/15 1127     Chief Complaint  Patient presents with  . Cough  . Fever     (Consider location/radiation/quality/duration/timing/severity/associated sxs/prior Treatment) HPI Patient presents to the emergency department with cough, sore throat, nasal congestion, nasal drainage, and body aches over the last 2 days.  Patient states that she was sent home from work today due to her cough.  The patient states that she has been taking over-the-counter cough and cold medications without significant relief of her symptoms.  Patient states that she does not have any chest pain, shortness of breath, nausea, vomiting, abdominal pain, back pain, rash, dysuria, incontinence, blurred vision, headache, near syncope or syncope.  The patient states that seems make her condition better or worse.  She states she has had some posttussive emesis as well Past Medical History  Diagnosis Date  . Bullous pemphigus   . Staph infection   . Cat bite   . 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 Situational anxiety   . PONV (postoperative nausea and vomiting)   . Arthritis    Past Surgical History  Procedure Laterality Date  . Abdominal hysterectomy    . Appendectomy    . Skin graft    . 22 surgeries on left elbow    . Debridement and closure wound Left 07/01/2015    Procedure: LEFT ELBOW EXCISION OF WOUND WITH PRIMARY CLOSURE 2X5 CM ;  Surgeon: Peggye Formlaire S Dillingham, DO;  Location: [facility];  Service: Plastics;  Laterality: Left;  . I&d extremity Left 07/08/2015    Procedure: IRRIGATION AND DEBRIDEMENT EXTREMITY, DRAINAGE OF LEFT ARM WOUND, A-CELL PLACEMENT, WOUND VAC PLACEMENT;  Surgeon: Alena Billslaire S Dillingham, DO;  Location: [facility];  Service: Plastics;  Laterality: Left;  . Incision and drainage of wound Left 08/05/2015    Procedure: IRRIGATION AND DEBRIDEMENT LEFT ELBOW WOUND, PLACEMENT OF ACELL;   Surgeon: Peggye Formlaire S Dillingham, DO;  Location: [facility];  Service: Plastics;  Laterality: Left;  . Application of a-cell of extremity Left 08/05/2015    Procedure: APPLICATION OF A-CELL OF EXTREMITY;  Surgeon: Peggye Formlaire S Dillingham, DO;  Location: [facility];  Service: Plastics;  Laterality: Left;   Family History  Problem Relation Age of Onset  . Liver disease Mother   . Dementia Mother   . Cancer Mother   . Cancer Maternal Grandmother    Social History  Substance Use Topics  . Smoking status: Never Smoker   . Smokeless tobacco: Never Used  . Alcohol Use: No   OB History    No data available     Review of Systems   All other systems negative except as documented in the HPI. All pertinent positives and negatives as reviewed in the HPI. Allergies  Morphine and related; Tessalon; Zofran; and Droperidol  Home Medications   Prior to Admission medications   Medication Sig Start Date End Date Taking? Authorizing Provider  clindamycin (CLEOCIN) 300 MG capsule Take 1 capsule (300 mg total) by mouth 4 (four) times daily. Patient not taking: Reported on 10/23/2015 09/25/15   Wallis BambergMario Mani, PA-C  clonazePAM (KLONOPIN) 1 MG tablet Take 1 mg by mouth 2 (two) times daily. Reported on 10/23/2015    Historical Provider, MD  gabapentin (NEURONTIN) 300 MG capsule TAKE 2 CAPSULES BY MOUTH 3 TIMES DAILY Patient taking differently: TAKE 600mg  BY MOUTH 3 TIMES  DAILY 09/24/15   [name], NP  HYDROcodone-acetaminophen (NORCO) 5-325 MG tablet Take 1 tablet by mouth every 6 (six) hours as needed. Patient not taking: Reported on 10/23/2015 09/25/15   Wallis Bamberg, PA-C  ibuprofen (ADVIL,MOTRIN) 200 MG tablet Take 400 mg by mouth every 6 (six) hours as needed for headache or moderate pain.    Historical Provider, MD  oxyCODONE-acetaminophen (PERCOCET/ROXICET) 5-325 MG tablet Take 1 tablet by mouth every 4 (four) hours as needed. Patient not taking: Reported on 10/23/2015 10/03/15    Garlon Hatchet, PA-C  promethazine (PHENERGAN) 25 MG tablet Take 1 tablet (25 mg total) by mouth every 6 (six) hours as needed for nausea or vomiting. Patient not taking: Reported on 10/23/2015 10/03/15   Garlon Hatchet, PA-C  sertraline (ZOLOFT) 50 MG tablet Take 50 mg by mouth daily.    Historical Provider, MD   BP 106/65 mmHg  Pulse 110  Temp(Src) 98.1 F (36.7 C) (Oral)  Resp 18  Ht  (1.626 m)  Wt 86.183 kg  BMI 32.60 kg/m2  SpO2 99% Physical Exam  Constitutional: She is oriented to person, place, and time. She appears well-developed and well-nourished. No distress.  HENT:  Head: Normocephalic and atraumatic.  Mouth/Throat: Oropharynx is clear and moist.  Eyes: Pupils are equal, round, and reactive to light.  Neck: Normal range of motion. Neck supple.  Cardiovascular: Normal rate, regular rhythm and normal heart sounds.  Exam reveals no gallop and no friction rub.   No murmur heard. Pulmonary/Chest: Effort normal and breath sounds normal. No respiratory distress. She has no wheezes.  Abdominal: Soft. Bowel sounds are normal. She exhibits no distension. There is no tenderness.  Neurological: She is alert and oriented to person, place, and time. She exhibits normal muscle tone. Coordination normal.  Skin: Skin is warm and dry. No rash noted. No erythema.  Psychiatric: She has a normal mood and affect. Her behavior is normal.  Nursing note and vitals reviewed.   ED Course  Procedures (including critical care time) Labs Review Labs Reviewed - No data to display  Imaging Review No results found. I have personally reviewed and evaluated these images and lab results as part of my medical decision-making.  Patient will be evaluated for a cough of the chest x-ray.  The patient mostly has a influenza-like illness.  Advised the plan and all questions were answered.   Charlestine Night, PA-C 11/19/15 1238  Derwood Kaplan, MD 11/19/15 1620

## 2015-11-19 NOTE — Discharge Instructions (Signed)
Return here as needed.  Your chest x-ray did not show any signs of pneumonia.  Increase her fluid intake and rest as much as possible.  This most likely an influenza-like illness

## 2015-11-22 ENCOUNTER — Emergency Department (HOSPITAL_BASED_OUTPATIENT_CLINIC_OR_DEPARTMENT_OTHER)
Admission: EM | Admit: 2015-11-22 | Discharge: 2015-11-22 | Disposition: A | Discharge status: Home / Self Care | Payer: Self-pay | Attending: Emergency Medicine | Admitting: Emergency Medicine

## 2015-11-22 ENCOUNTER — Emergency Department (HOSPITAL_BASED_OUTPATIENT_CLINIC_OR_DEPARTMENT_OTHER): Payer: Self-pay

## 2015-11-22 ENCOUNTER — Encounter (HOSPITAL_BASED_OUTPATIENT_CLINIC_OR_DEPARTMENT_OTHER): Payer: Self-pay

## 2015-11-22 DIAGNOSIS — Z8659 Personal history of other mental and behavioral disorders: Secondary | ICD-10-CM | POA: Insufficient documentation

## 2015-11-22 DIAGNOSIS — R0981 Nasal congestion: Secondary | ICD-10-CM | POA: Insufficient documentation

## 2015-11-22 DIAGNOSIS — Z872 Personal history of diseases of the skin and subcutaneous tissue: Secondary | ICD-10-CM | POA: Insufficient documentation

## 2015-11-22 DIAGNOSIS — Z7952 Long term (current) use of systemic steroids: Secondary | ICD-10-CM | POA: Insufficient documentation

## 2015-11-22 DIAGNOSIS — R05 Cough: Secondary | ICD-10-CM | POA: Insufficient documentation

## 2015-11-22 DIAGNOSIS — Z8619 Personal history of other infectious and parasitic diseases: Secondary | ICD-10-CM | POA: Insufficient documentation

## 2015-11-22 DIAGNOSIS — R059 Cough, unspecified: Secondary | ICD-10-CM

## 2015-11-22 DIAGNOSIS — Z79899 Other long term (current) drug therapy: Secondary | ICD-10-CM | POA: Insufficient documentation

## 2015-11-22 DIAGNOSIS — M1990 Unspecified osteoarthritis, unspecified site: Secondary | ICD-10-CM | POA: Insufficient documentation

## 2015-11-22 DIAGNOSIS — R0789 Other chest pain: Secondary | ICD-10-CM | POA: Insufficient documentation

## 2015-11-22 DIAGNOSIS — R630 Anorexia: Secondary | ICD-10-CM | POA: Insufficient documentation

## 2015-11-22 DIAGNOSIS — M791 Myalgia: Secondary | ICD-10-CM | POA: Insufficient documentation

## 2015-11-22 DIAGNOSIS — J029 Acute pharyngitis, unspecified: Secondary | ICD-10-CM | POA: Insufficient documentation

## 2015-11-22 DIAGNOSIS — R5383 Other fatigue: Secondary | ICD-10-CM | POA: Insufficient documentation

## 2015-11-22 MED ORDER — HYDROCODONE-HOMATROPINE 5-1.5 MG/5ML PO SYRP: 5.0000 mL | ORAL_SOLUTION | Freq: Four times a day (QID) | ORAL | Status: DC | PRN

## 2015-11-22 MED ORDER — OXYMETAZOLINE HCL 0.05 % NA SOLN: 1.0000 | Freq: Two times a day (BID) | NASAL | Status: DC

## 2015-11-22 NOTE — ED Provider Notes (Signed)
CSN: [id number]     Arrival date & time 11/22/15  1053 History   First MD Initiated Contact with Patient 11/22/15 1113     Chief Complaint  Patient presents with  . Cough  . Nasal Congestion     (Consider location/radiation/quality/duration/timing/severity/associated sxs/prior Treatment) HPI   Patient is a 50 year old female with past medical history of arthritis who presents to the ED with complaint of cough, onset one week. Patient reports having a productive cough with yellow sputum, endorses associated sore throat, chills, body aches, nasal congestion, rhinorrhea, chest tightness, decreased appetite and decreased oral intake. She notes her symptoms appear to be worse at night. Denies fever, headache, sinus pressure, ear pain, shortness of breath, chest pain, palpitations, abdominal pain, nausea, vomiting, diarrhea, urinary symptoms. Patient states she has been taking Tylenol, Robitussin and Delsym at home without relief. Patient reports she was seen in the ED on 11/19/15, diagnosed with flulike symptoms and discharged home with prednisone, decongestion and antitussive. She reports having mild intermittent relief but then notes her symptoms remained constant over the past few days. She notes she had a history of bronchitis in January 2017. Patient reports her cousins have been sick with similar symptoms over the past few weeks.    Past Medical History  Diagnosis Date  . Bullous pemphigus   . Staph infection   . Cat bite   . 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 Situational anxiety   . PONV (postoperative nausea and vomiting)   . Arthritis    Past Surgical History  Procedure Laterality Date  . Abdominal hysterectomy    . Appendectomy    . Skin graft    . 22 surgeries on left elbow    . Debridement and closure wound Left 07/01/2015    Procedure: LEFT ELBOW EXCISION OF WOUND WITH PRIMARY CLOSURE 2X5 CM ;  Surgeon: Peggye Form, DO;  Location: [facility];  Service:  Plastics;  Laterality: Left;  . I&d extremity Left 07/08/2015    Procedure: IRRIGATION AND DEBRIDEMENT EXTREMITY, DRAINAGE OF LEFT ARM WOUND, A-CELL PLACEMENT, WOUND VAC PLACEMENT;  Surgeon: Alena Bills Dillingham, DO;  Location: [facility];  Service: Plastics;  Laterality: Left;  . Incision and drainage of wound Left 08/05/2015    Procedure: IRRIGATION AND DEBRIDEMENT LEFT ELBOW WOUND, PLACEMENT OF ACELL;  Surgeon: Peggye Form, DO;  Location: [facility];  Service: Plastics;  Laterality: Left;  . Application of a-cell of extremity Left 08/05/2015    Procedure: APPLICATION OF A-CELL OF EXTREMITY;  Surgeon: Peggye Form, DO;  Location: [facility];  Service: Plastics;  Laterality: Left;   Family History  Problem Relation Age of Onset  . Liver disease Mother   . Dementia Mother   . Cancer Mother   . Cancer Maternal Grandmother    Social History  Substance Use Topics  . Smoking status: Never Smoker   . Smokeless tobacco: Never Used  . Alcohol Use: No   OB History    No data available     Review of Systems  Constitutional: Positive for chills, appetite change (decreased) and fatigue.  HENT: Positive for congestion, rhinorrhea and sore throat.   Respiratory: Positive for cough and chest tightness.   Musculoskeletal: Positive for myalgias (generalized).  All other systems reviewed and are negative.     Allergies  Morphine and related; Tessalon; Zofran; and Droperidol  Home Medications   Prior to Admission medications   Medication Sig Start Date End  Date Taking? Authorizing Provider  gabapentin (NEURONTIN) 300 MG capsule TAKE 2 CAPSULES BY MOUTH 3 TIMES DAILY Patient taking differently: TAKE 600mg  BY MOUTH 3 TIMES DAILY 09/24/15   Henrietta HooverLinda C [name], NP  Guaifenesin 1200 MG TB12 Take 1 tablet (1,200 mg total) by mouth 2 (two) times daily. 11/19/15   Christopher Lawyer, PA-C  HYDROcodone-homatropine (HYCODAN) 5-1.5 MG/5ML syrup Take 5 mLs by mouth  every 6 (six) hours as needed for cough. 11/22/15   Barrett HenleNicole Elizabeth Taisa Deloria, PA-C  ibuprofen (ADVIL,MOTRIN) 200 MG tablet Take 400 mg by mouth every 6 (six) hours as needed for headache or moderate pain.    Historical Provider, MD  oxymetazoline (AFRIN NASAL SPRAY) 0.05 % nasal spray Place 1 spray into both nostrils 2 (two) times daily. 11/22/15   Barrett HenleNicole Elizabeth Vinia Jemmott, PA-C  predniSONE (DELTASONE) 50 MG tablet Take 1 tablet (50 mg total) by mouth daily. 11/19/15   [name] Lawyer, PA-C  promethazine (PHENERGAN) 25 MG tablet Take 1 tablet (25 mg total) by mouth every 6 (six) hours as needed for nausea or vomiting. Patient not taking: Reported on 10/23/2015 10/03/15   Garlon HatchetLisa M Sanders, PA-C  promethazine-dextromethorphan (PROMETHAZINE-DM) 6.25-15 MG/5ML syrup Take 5 mLs by mouth 4 (four) times daily as needed for cough. 11/19/15   [name] Lawyer, PA-C  sertraline (ZOLOFT) 50 MG tablet Take 50 mg by mouth daily.    Historical Provider, MD   BP 105/83 mmHg  Pulse 89  Temp(Src) 98.7 F (37.1 C) (Oral)  Resp 16  Ht 5\' 4"  (1.626 m)  Wt 86.183 kg  BMI 32.60 kg/m2  SpO2 100% Physical Exam  Constitutional: She is oriented to person, place, and time. She appears well-developed and well-nourished. No distress.  HENT:  Head: Normocephalic and atraumatic.  Right Ear: Tympanic membrane normal.  Left Ear: Tympanic membrane normal.  Nose: Rhinorrhea present. Right sinus exhibits no maxillary sinus tenderness and no frontal sinus tenderness. Left sinus exhibits no maxillary sinus tenderness and no frontal sinus tenderness.  Mouth/Throat: Uvula is midline and mucous membranes are normal. Posterior oropharyngeal erythema present. No oropharyngeal exudate, posterior oropharyngeal edema or tonsillar abscesses.  Eyes: Conjunctivae and EOM are normal. Pupils are equal, round, and reactive to light. Right eye exhibits no discharge. Left eye exhibits no discharge. No scleral icterus.  Neck: Normal range of motion.  Neck supple.  Cardiovascular: Normal rate, regular rhythm, normal heart sounds and intact distal pulses.   Pulmonary/Chest: Effort normal and breath sounds normal. No respiratory distress. She has no wheezes. She has no rales. She exhibits no tenderness.  Abdominal: Soft. Bowel sounds are normal. She exhibits no distension and no mass. There is no tenderness. There is no rebound and no guarding.  Musculoskeletal: Normal range of motion. She exhibits no edema.  Lymphadenopathy:    She has no cervical adenopathy.  Neurological: She is alert and oriented to person, place, and time.  Skin: Skin is warm and dry. She is not diaphoretic.  Nursing note and vitals reviewed.   ED Course  Procedures (including critical care time) Labs Review Labs Reviewed - No data to display  Imaging Review Dg Chest 2 View  11/22/2015  CLINICAL DATA:  Productive cough.  Yellow sputum. EXAM: CHEST - 2 VIEW COMPARISON:  Two-view chest x-ray 11/19/2015 FINDINGS: The heart size is normal. The lungs are clear. The visualized soft tissues and bony thorax are unremarkable. IMPRESSION: Negative two view chest x-ray Electronically Signed   By: Marin Robertshristopher  Mattern M.D.   On: 11/22/2015 12:26  I have personally reviewed and evaluated these images and lab results as part of my medical decision-making.   EKG Interpretation None      MDM   Final diagnoses:  Cough  Nasal congestion    Patient presents with cough and nasal congestion for the past week. Chart review shows that patient is seen in the ED on 11/19/15 with similar symptoms, negative chest x-ray, patient was discharged home with guaifenesin, prednisone and promethazine-DM. Patient reports mild intermittent relief with medications. VSS. Exam revealed rhinorrhea, posterior oropharyngeal erythema, lungs CTAB, otherwise unremarkable. Chest x-ray negative. Suspect patient's symptoms are likely due to the flu vs. Viral URI and you have any further workup or imaging is  warranted at this time. No signs of dehydration, tolerating PO's. Discussed the cost versus benefit of Tamiflu treatment with the patient. The patient understands that symptoms are greater than the recommended 24-48 hour window of treatment.  Patient will be discharged with instructions to orally hydrate, rest, use over-the-counter medications such as anti-inflammatories ibuprofen and Aleve for muscle aches and Tylenol for fever. Patient given prescription for Afrin and Hycodan. Patient given resources to follow up with PCP.    Evaluation does not show pathology requring ongoing emergent intervention or admission. Pt is hemodynamically stable and mentating appropriately. Discussed findings/results and plan with patient/guardian, who agrees with plan. All questions answered. Return precautions discussed and outpatient follow up given.     Satira Sark [place], [place] 11/22/15 1733  Alvira Monday, MD 11/22/15 2249

## 2015-11-22 NOTE — Discharge Instructions (Signed)
Take your medication as prescribed. I recommend continuing to take Tylenol as prescribed over-the-counter as needed for fever and bodyaches. Drink six 8 ounce glasses of water Gatorade daily to remain hydrated at home. You may also use a coolmist humidifier and drink warm water/tea with honey to help with your cough and congestion. Please follow up with a primary care provider from the Resource Guide provided below in 4-5 days as needed. Please return to the Emergency Department if symptoms worsen or new onset of uncontrollable fever, decreased oral intake, unable to tolerate fluids, coughing up blood, chest pain, difficulty breathing, vomiting, abdominal pain.

## 2015-11-22 NOTE — ED Notes (Signed)
Patient here with ongoing cough and congestion for greater than 1 week. [facility] and taking meds as prescribed with no relief. Reports that cough is worse at night

## 2015-11-24 MED FILL — GABAPENTIN 300 MG CAPSULE: 300 | 30 days supply | Qty: 180 | Fill #2

## 2015-11-25 ENCOUNTER — Emergency Department (HOSPITAL_COMMUNITY)
Admission: EM | Admit: 2015-11-25 | Discharge: 2015-11-25 | Disposition: A | Discharge status: Home / Self Care | Payer: Self-pay | Attending: Physician Assistant | Admitting: Physician Assistant

## 2015-11-25 ENCOUNTER — Other Ambulatory Visit: Payer: Self-pay | Admitting: Family Medicine

## 2015-11-25 ENCOUNTER — Encounter (HOSPITAL_COMMUNITY): Payer: Self-pay | Admitting: Emergency Medicine

## 2015-11-25 DIAGNOSIS — M1990 Unspecified osteoarthritis, unspecified site: Secondary | ICD-10-CM | POA: Insufficient documentation

## 2015-11-25 DIAGNOSIS — Z8619 Personal history of other infectious and parasitic diseases: Secondary | ICD-10-CM | POA: Insufficient documentation

## 2015-11-25 DIAGNOSIS — F419 Anxiety disorder, unspecified: Secondary | ICD-10-CM | POA: Insufficient documentation

## 2015-11-25 DIAGNOSIS — Z87828 Personal history of other (healed) physical injury and trauma: Secondary | ICD-10-CM | POA: Insufficient documentation

## 2015-11-25 DIAGNOSIS — Z79899 Other long term (current) drug therapy: Secondary | ICD-10-CM | POA: Insufficient documentation

## 2015-11-25 DIAGNOSIS — Z872 Personal history of diseases of the skin and subcutaneous tissue: Secondary | ICD-10-CM | POA: Insufficient documentation

## 2015-11-25 DIAGNOSIS — Z7952 Long term (current) use of systemic steroids: Secondary | ICD-10-CM | POA: Insufficient documentation

## 2015-11-25 DIAGNOSIS — J40 Bronchitis, not specified as acute or chronic: Secondary | ICD-10-CM

## 2015-11-25 DIAGNOSIS — J209 Acute bronchitis, unspecified: Secondary | ICD-10-CM | POA: Insufficient documentation

## 2015-11-25 MED ORDER — AZITHROMYCIN 250 MG PO TABS: 250.0000 mg | ORAL_TABLET | Freq: Every day | ORAL | Status: DC

## 2015-11-25 MED FILL — AZITHROMYCIN 250 MG TABLET: 250 | 5 days supply | Qty: 6 | Fill #0

## 2015-11-25 NOTE — ED Notes (Signed)
Bed: WA27 Expected date:  Expected time:  Means of arrival:  Comments: 

## 2015-11-25 NOTE — Discharge Instructions (Signed)
Upper Respiratory Infection, Adult Most upper respiratory infections (URIs) are a viral infection of the air passages leading to the lungs. A URI affects the nose, throat, and upper air passages. The most common type of URI is nasopharyngitis and is typically referred to as "the common cold." URIs run their course and usually go away on their own. Most of the time, a URI does not require medical attention, but sometimes a bacterial infection in the upper airways can follow a viral infection. This is called a secondary infection. Sinus and middle ear infections are common types of secondary upper respiratory infections. Bacterial pneumonia can also complicate a URI. A URI can worsen asthma and chronic obstructive pulmonary disease (COPD). Sometimes, these complications can require emergency medical care and may be life threatening.  CAUSES Almost all URIs are caused by viruses. A virus is a type of germ and can spread from one person to another.  RISKS FACTORS You may be at risk for a URI if:   You smoke.   You have chronic heart or lung disease.  You have a weakened defense (immune) system.   You are very young or very old.   You have nasal allergies or asthma.  You work in crowded or poorly ventilated areas.  You work in health care facilities or schools. SIGNS AND SYMPTOMS  Symptoms typically develop 2-3 days after you come in contact with a cold virus. Most viral URIs last 7-10 days. However, viral URIs from the influenza virus (flu virus) can last 14-18 days and are typically more severe. Symptoms may include:   Runny or stuffy (congested) nose.   Sneezing.   Cough.   Sore throat.   Headache.   Fatigue.   Fever.   Loss of appetite.   Pain in your forehead, behind your eyes, and over your cheekbones (sinus pain).  Muscle aches.  DIAGNOSIS  Your health care provider may diagnose a URI by:  Physical exam.  Tests to check that your symptoms are not due to  another condition such as:  Strep throat.  Sinusitis.  Pneumonia.  Asthma. TREATMENT  A URI goes away on its own with time. It cannot be cured with medicines, but medicines may be prescribed or recommended to relieve symptoms. Medicines may help:  Reduce your fever.  Reduce your cough.  Relieve nasal congestion. HOME CARE INSTRUCTIONS   Take medicines only as directed by your health care provider.   Gargle warm saltwater or take cough drops to comfort your throat as directed by your health care provider.  Use a warm mist humidifier or inhale steam from a shower to increase air moisture. This may make it easier to breathe.  Drink enough fluid to keep your urine clear or pale yellow.   Eat soups and other clear broths and maintain good nutrition.   Rest as needed.   Return to work when your temperature has returned to normal or as your health care provider advises. You may need to stay home longer to avoid infecting others. You can also use a face mask and careful hand washing to prevent spread of the virus.  Increase the usage of your inhaler if you have asthma.   Do not use any tobacco products, including cigarettes, chewing tobacco, or electronic cigarettes. If you need help quitting, ask your health care provider. PREVENTION  The best way to protect yourself from getting a cold is to practice good hygiene.   Avoid oral or hand contact with people with cold  symptoms.   Wash your hands often if contact occurs.  There is no clear evidence that vitamin C, vitamin E, echinacea, or exercise reduces the chance of developing a cold. However, it is always recommended to get plenty of rest, exercise, and practice good nutrition.  SEEK MEDICAL CARE IF:   You are getting worse rather than better.   Your symptoms are not controlled by medicine.   You have chills.  You have worsening shortness of breath.  You have brown or red mucus.  You have yellow or brown nasal  discharge.  You have pain in your face, especially when you bend forward.  You have a fever.  You have swollen neck glands.  You have pain while swallowing.  You have white areas in the back of your throat. SEEK IMMEDIATE MEDICAL CARE IF:   You have severe or persistent:  Headache.  Ear pain.  Sinus pain.  Chest pain.  You have chronic lung disease and any of the following:  Wheezing.  Prolonged cough.  Coughing up blood.  A change in your usual mucus.  You have a stiff neck.  You have changes in your:  Vision.  Hearing.  Thinking.  Mood. MAKE SURE YOU:   Understand these instructions.  Will watch your condition.  Will get help right away if you are not doing well or get worse.   This information is not intended to replace advice given to you by your health care provider. Make sure you discuss any questions you have with your health care provider.   Take antibiotics as prescribed. Continue taking home cough syrup and decongestant as needed. Return to the emergency department if you experience severe worsening of her symptoms, fevers, chills, difficulty breathing, chest pain, loss of consciousness.

## 2015-11-25 NOTE — ED Notes (Signed)
Per pt, states bronchitis for 2 weeks-[facility] symptoms-states nothing is helping cough-blood tinged sputum this am

## 2015-11-25 NOTE — ED Provider Notes (Signed)
CSN: [id number]     Arrival date & time 11/25/15  4098 History   First MD Initiated Contact with Patient 11/25/15 1101     Chief Complaint  Patient presents with  . Bronchitis     (Consider location/radiation/quality/duration/timing/severity/associated sxs/prior Treatment) HPI  Sabrina [name] is a 50 y.o F with no significant past medical history presents the emergency department today complaining of cough, congestion. Patient states that she has had bronchitis for the last 2 weeks. She has had symptoms of productive cough with yellow, green sputum, diffuse myalgias, nasal congestion. She states in the beginning she was running fevers but has not had one in over a week. She does still endorse chills at nighttime and states that today she coughed up a small amount of blood. Patient has pain in her chest after coughing. She's been seen in the emergency department 3 times for this issue in the last 2 weeks. She was prescribed cough medication, prednisone and congestion medication which she states has not relieved her symptoms. She does state that the cough medication does help her at nighttime. Denies vomiting, diarrhea, dizziness, loss of consciousness, shortness of breath.   Past Medical History  Diagnosis Date  . Bullous pemphigus   . Staph infection   . Cat bite   . 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 Situational anxiety   . PONV (postoperative nausea and vomiting)   . Arthritis    Past Surgical History  Procedure Laterality Date  . Abdominal hysterectomy    . Appendectomy    . Skin graft    . 22 surgeries on left elbow    . Debridement and closure wound Left 07/01/2015    Procedure: LEFT ELBOW EXCISION OF WOUND WITH PRIMARY CLOSURE 2X5 CM ;  Surgeon: Peggye Form, DO;  Location: [facility];  Service: Plastics;  Laterality: Left;  . I&d extremity Left 07/08/2015    Procedure: IRRIGATION AND DEBRIDEMENT EXTREMITY, DRAINAGE OF LEFT ARM WOUND, A-CELL PLACEMENT, WOUND  VAC PLACEMENT;  Surgeon: Alena Bills Dillingham, DO;  Location: [facility];  Service: Plastics;  Laterality: Left;  . Incision and drainage of wound Left 08/05/2015    Procedure: IRRIGATION AND DEBRIDEMENT LEFT ELBOW WOUND, PLACEMENT OF ACELL;  Surgeon: Peggye Form, DO;  Location: [facility];  Service: Plastics;  Laterality: Left;  . Application of a-cell of extremity Left 08/05/2015    Procedure: APPLICATION OF A-CELL OF EXTREMITY;  Surgeon: Peggye Form, DO;  Location:  SURGERY CENTER;  Service: Plastics;  Laterality: Left;   Family History  Problem Relation Age of Onset  . Liver disease Mother   . Dementia Mother   . Cancer Mother   . Cancer Maternal Grandmother    Social History  Substance Use Topics  . Smoking status: Never Smoker   . Smokeless tobacco: Never Used  . Alcohol Use: No   OB History    No data available     Review of Systems  All other systems reviewed and are negative.     Allergies  Morphine and related; Tessalon; Zofran; and Droperidol  Home Medications   Prior to Admission medications   Medication Sig Start Date End Date Taking? Authorizing Provider  gabapentin (NEURONTIN) 300 MG capsule TAKE 2 CAPSULES BY MOUTH 3 TIMES DAILY Patient taking differently: TAKE  BY MOUTH 3 TIMES DAILY 09/24/15   [name], NP  Guaifenesin 1200 MG TB12 Take 1 tablet (1,200 mg total) by mouth 2 (two) times  daily. 11/19/15   [name] Lawyer, PA-C  HYDROcodone-homatropine [facility](HYCODAN) 5-1.5 MG/5ML syrup Take 5 mLs by mouth every 6 (six) hours as needed for cough. 11/22/15   Barrett HenleNicole Elizabeth Nadeau, PA-C  ibuprofen (ADVIL,MOTRIN) 200 MG tablet Take 400 mg by mouth every 6 (six) hours as needed for headache or moderate pain.    Historical Provider, MD  oxymetazoline (AFRIN NASAL SPRAY) 0.05 % nasal spray Place 1 spray into both nostrils 2 (two) times daily. 11/22/15   Barrett HenleNicole Elizabeth Nadeau, PA-C  predniSONE (DELTASONE) 50 MG tablet Take  1 tablet (50 mg total) by mouth daily. 11/19/15   [name] Lawyer, PA-C  promethazine (PHENERGAN) 25 MG tablet Take 1 tablet (25 mg total) by mouth every 6 (six) hours as needed for nausea or vomiting. Patient not taking: Reported on 10/23/2015 10/03/15   Garlon HatchetLisa M Sanders, PA-C  promethazine-dextromethorphan (PROMETHAZINE-DM) 6.25-15 MG/5ML syrup Take 5 mLs by mouth 4 (four) times daily as needed for cough. 11/19/15   [name] Lawyer, PA-C  sertraline (ZOLOFT) 50 MG tablet Take 50 mg by mouth daily.    Historical Provider, MD   BP 113/89 mmHg  Pulse 95  Temp(Src) 98 F (36.7 C) (Oral)  Resp 18  SpO2 95% Physical Exam  Constitutional: She is oriented to person, place, and time. She appears well-developed and well-nourished. No distress.  HENT:  Head: Normocephalic and atraumatic.  Mouth/Throat: No oropharyngeal exudate.  Clear nasal discharge. Hoarse voice.  Eyes: Conjunctivae and EOM are normal. Pupils are equal, round, and reactive to light. Right eye exhibits no discharge. Left eye exhibits no discharge. No scleral icterus.  Neck: Neck supple.  Cardiovascular: Normal rate, regular rhythm, normal heart sounds and intact distal pulses.  Exam reveals no gallop and no friction rub.   No murmur heard. Pulmonary/Chest: Effort normal and breath sounds normal. No respiratory distress. She has no wheezes. She has no rales. She exhibits no tenderness.  Abdominal: Soft. She exhibits no distension. There is no tenderness. There is no guarding.  Musculoskeletal: Normal range of motion. She exhibits no edema.  Lymphadenopathy:    She has no cervical adenopathy.  Neurological: She is alert and oriented to person, place, and time.  Skin: Skin is warm and dry. No rash noted. She is not diaphoretic. No erythema. No pallor.  Psychiatric: She has a normal mood and affect. Her behavior is normal.  Nursing note and vitals reviewed.   ED Course  Procedures (including critical care time) Labs Review Labs  Reviewed - No data to display  Imaging Review No results found. I have personally reviewed and evaluated these images and lab results as part of my medical decision-making.   EKG Interpretation None      MDM   Final diagnoses:  Bronchitis    Pt presents with URI symptoms onset 2 weeks ago. Pt has had multiple negative CXR, will not repeat today. Afebril in ED. All VSS. Pt has tried prednisone, antitussives and antihistamines with no relief. Given duration of symptoms will give Z-pak to cover atypicals. Pt is to follow up with PCP. Pt has home cough medication. Return precautions outlined in patient discharge instructions.      Lester KinsmanSamantha Tripp KalifornskyDowless, PA-C 11/27/15 0014  Courteney Randall AnLyn Mackuen, MD 11/27/15 1010

## 2015-12-08 MED FILL — clonazePAM 0.5 MG TABS: 0.5 | 30 days supply | Qty: 60 | Fill #0

## 2015-12-15 MED FILL — HYDROCODON-APAP 10-325: 10-325 | 13 days supply | Qty: 40 | Fill #0

## 2015-12-18 MED FILL — OXYCODONE-APAP 10-325 TAB: 10-325 | 15 days supply | Qty: 60 | Fill #0

## 2015-12-21 ENCOUNTER — Ambulatory Visit: Payer: Self-pay | Admitting: Family Medicine

## 2015-12-23 MED FILL — GABAPENTIN 300 MG CAPSULE: 300 | 30 days supply | Qty: 180 | Fill #0

## 2015-12-23 MED FILL — traMADol HCL 50 MG TABS: 50 | 15 days supply | Qty: 60 | Fill #0

## 2016-01-05 MED FILL — clonazePAM 0.5 MG TABS: 0.5 | 30 days supply | Qty: 60 | Fill #1

## 2016-01-07 MED FILL — traMADol HCL 50 MG TABS: 50 | 15 days supply | Qty: 60 | Fill #1

## 2016-01-11 HISTORY — PX: WRIST SURGERY: SHX841

## 2016-01-11 MED FILL — HYDROCODON-APAP 10-325: 10-325 | 6 days supply | Qty: 20 | Fill #0

## 2016-01-13 MED FILL — OXYCODONE/APAP 5-325: 5-325 | 10 days supply | Qty: 40 | Fill #0

## 2016-01-18 ENCOUNTER — Encounter: Payer: Self-pay | Admitting: Family Medicine

## 2016-01-18 ENCOUNTER — Ambulatory Visit (INDEPENDENT_AMBULATORY_CARE_PROVIDER_SITE_OTHER): Payer: Self-pay | Admitting: Family Medicine

## 2016-01-18 VITALS — BP 130/90 | HR 100 | Temp 98.3°F | Ht 64.0 in | Wt 184.0 lb

## 2016-01-18 DIAGNOSIS — G609 Hereditary and idiopathic neuropathy, unspecified: Secondary | ICD-10-CM

## 2016-01-18 DIAGNOSIS — Z0000 Encounter for general adult medical examination without abnormal findings: Secondary | ICD-10-CM

## 2016-01-18 MED ORDER — GABAPENTIN 300 MG PO CAPS: ORAL_CAPSULE | ORAL | Status: DC

## 2016-01-18 NOTE — Progress Notes (Signed)
Patient ID: Sabrina [name], female   DOB: 21-Aug-1966, 50 y.o.   MRN: [id number]   Sabrina [name], is a 50 y.o. female  XLK:[id number]  [id number]  DOB - 07-04-1966  CC:  Chief Complaint  Patient presents with  . follow up    has had 3 more surgeries on left elbow, needs refill on neurontin, otherwise doing well       HPI: Sabrina [name] is a 50 y.o. female here to follow-up. I saw her last 6 months ago and this is a routine follow-up. She has a history of a cat bite with resultant infection that has required many surgeries and antibiotics over the last year. It has finally healed and is causing her little if any pain. She does still have some numbness and tingling and wishes to continue gabapentin. She is due for at least one more surgery in the future for revision/repair of scar tissue. She denies any other complaints today.    Health Maintenance:  She has had a hysterectomy and does not need a PAP. I ordered a mammogram on her last visit but she has not schedule that yet. She received her last Tdap about a year ago.  Allergies  Allergen Reactions  . Morphine And Related Itching  . Tessalon [Benzonatate] Other (See Comments)    Watery eyes  . Zofran [Ondansetron Hcl]     Spots around iv site  . Droperidol Palpitations   Past Medical History  Diagnosis Date  . Bullous pemphigus   . Staph infection   . Cat bite   . 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 Situational anxiety   . PONV (postoperative nausea and vomiting)   . Arthritis    Current Outpatient Prescriptions on File Prior to Visit  Medication Sig Dispense Refill  . ibuprofen (ADVIL,MOTRIN) 200 MG tablet Take 400 mg by mouth every 6 (six) hours as needed for headache or moderate pain. Reported on 01/18/2016    . oxymetazoline (AFRIN NASAL SPRAY) 0.05 % nasal spray Place 1 spray into both nostrils 2 (two) times daily. (Patient not taking: Reported on 01/18/2016) 30 mL 0  . promethazine-dextromethorphan (PROMETHAZINE-DM) 6.25-15  MG/5ML syrup Take 5 mLs by mouth 4 (four) times daily as needed for cough. (Patient not taking: Reported on 01/18/2016) 120 mL 0   No current facility-administered medications on file prior to visit.   Family History  Problem Relation Age of Onset  . Liver disease Mother   . Dementia Mother   . Cancer Mother   . Cancer Maternal Grandmother    Social History   Social History  . Marital Status: Single    Spouse Name: N/A  . Number of Children: N/A  . Years of Education: N/A   Occupational History  . Not on file.   Social History Main Topics  . Smoking status: Never Smoker   . Smokeless tobacco: Never Used  . Alcohol Use: No  . Drug Use: No  . Sexual Activity: Not on file   Other Topics Concern  . Not on file   Social History Narrative    Review of Systems: Constitutional: Negative for fever, chills, appetite change, weight loss,  Fatigue. Skin: Negative for rashes or lesions of concern. HENT: Negative for ear pain, ear discharge.nose bleeds Eyes: Negative for pain, discharge, redness, itching and visual disturbance. Neck: Negative for pain, stiffness Respiratory: Negative for cough, shortness of breath,   Cardiovascular: Negative for chest pain, palpitations and leg swelling. Gastrointestinal: Negative for abdominal pain, nausea,  vomiting, diarrhea, constipations Genitourinary: Negative for dysuria, urgency, frequency, hematuria,  Musculoskeletal: Negative for back pain, joint pain, joint  swelling, and gait problem.Negative for weakness. Neurological: Negative for dizziness, tremors, seizures, syncope,   light-headedness, numbness and headaches.  Hematological: Negative for easy bruising or bleeding Psychiatric/Behavioral: Negative for depression, anxiety, decreased concentration, confusion   Objective:   Filed Vitals:   01/18/16 1322  BP: 130/90  Pulse: 100  Temp: 98.3 F (36.8 C)    Physical Exam: Constitutional: Patient appears well-developed and  well-nourished. No distress. HENT: Normocephalic, atraumatic, External right and left ear normal. Oropharynx is clear and moist.  Eyes: Conjunctivae and EOM are normal. PERRLA, no scleral icterus. Neck: Normal ROM. Neck supple. No lymphadenopathy, No thyromegaly. CVS: RRR, S1/S2 +, no murmurs, no gallops, no rubs Pulmonary: Effort and breath sounds normal, no stridor, rhonchi, wheezes, rales.  Abdominal: Soft. Normoactive BS,, no distension, tenderness, rebound or guarding.  Musculoskeletal: Normal range of motion. No edema and no tenderness.  Neuro: Alert.Normal muscle tone coordination. Non-focal Skin: Skin is warm and dry. No rash noted. Not diaphoretic. No erythema. No pallor. There is a large scar on her lateral left elbow. Psychiatric: Normal mood and affect. Behavior, judgment, thought content normal.  Lab Results  Component Value Date   WBC 3.6* 10/03/2015   HGB 12.0 10/03/2015   HCT 35.8* 10/03/2015   MCV 92.5 10/03/2015   PLT 218 10/03/2015   Lab Results  Component Value Date   CREATININE 0.96 10/03/2015   BUN 16 10/03/2015   NA 142 10/03/2015   K 3.4* 10/03/2015   CL 106 10/03/2015   CO2 26 10/03/2015    Lab Results  Component Value Date   HGBA1C  07/05/2015    QUESTIONABLE IDENTIFICATION / INCORRECTLY LABELED SPECIMEN   Lipid Panel     Component Value Date/Time   CHOL 237* 04/27/2015 1449   TRIG 144 04/27/2015 1449   HDL 70 04/27/2015 1449   CHOLHDL 3.4 04/27/2015 1449   VLDL 29 04/27/2015 1449   LDLCALC 138* 04/27/2015 1449       Assessment and plan:   1. Health care maintenance  - COMPLETE METABOLIC PANEL WITH GFR - CBC with Differential - Lipid panel  2. Hereditary and idiopathic peripheral neuropathy  - gabapentin (NEURONTIN) 300 MG capsule; TAKE 2 CAPSULES BY MOUTH 3 TIMES DAILY  Dispense: 630 capsule; Refill: 0   Return in about 6 months (around 07/20/2016).  The patient was given clear instructions to go to ER or return to medical center  if symptoms don't improve, worsen or new problems develop. The patient verbalized understanding.    Henrietta HooverLinda C Lorance Pickeral FNP  01/18/2016, 1:46 PM

## 2016-01-18 NOTE — Patient Instructions (Signed)
Call and make appointment for mammogram Follow-up in 6 months.

## 2016-01-18 NOTE — Addendum Note (Signed)
Addended by: Henrietta HooverBERNHARDT, [employer] C on: 01/18/2016 02:01 PM   Modules accepted: Orders

## 2016-01-26 MED FILL — GABAPENTIN 300 MG CAPSULE: 300 | 30 days supply | Qty: 180 | Fill #0

## 2016-01-26 MED FILL — traMADol HCL 50 MG TABS: 50 | 15 days supply | Qty: 60 | Fill #0

## 2016-02-05 MED FILL — GABAPENTIN 300 MG CAPSULE: 300 | 30 days supply | Qty: 180 | Fill #1

## 2016-02-15 MED FILL — traMADol HCL 50 MG TABS: 50 | 15 days supply | Qty: 60 | Fill #1

## 2016-03-01 ENCOUNTER — Encounter (HOSPITAL_COMMUNITY): Payer: Self-pay

## 2016-03-01 ENCOUNTER — Emergency Department (HOSPITAL_COMMUNITY)
Admission: EM | Admit: 2016-03-01 | Discharge: 2016-03-01 | Disposition: A | Discharge status: Home / Self Care | Payer: Self-pay | Attending: Emergency Medicine | Admitting: Emergency Medicine

## 2016-03-01 ENCOUNTER — Emergency Department (HOSPITAL_COMMUNITY): Payer: Self-pay

## 2016-03-01 DIAGNOSIS — M1990 Unspecified osteoarthritis, unspecified site: Secondary | ICD-10-CM | POA: Insufficient documentation

## 2016-03-01 DIAGNOSIS — R52 Pain, unspecified: Secondary | ICD-10-CM | POA: Insufficient documentation

## 2016-03-01 DIAGNOSIS — R509 Fever, unspecified: Secondary | ICD-10-CM

## 2016-03-01 DIAGNOSIS — J069 Acute upper respiratory infection, unspecified: Secondary | ICD-10-CM | POA: Insufficient documentation

## 2016-03-01 DIAGNOSIS — J029 Acute pharyngitis, unspecified: Secondary | ICD-10-CM

## 2016-03-01 DIAGNOSIS — R05 Cough: Secondary | ICD-10-CM

## 2016-03-01 DIAGNOSIS — R059 Cough, unspecified: Secondary | ICD-10-CM

## 2016-03-01 DIAGNOSIS — Z79899 Other long term (current) drug therapy: Secondary | ICD-10-CM | POA: Insufficient documentation

## 2016-03-01 DIAGNOSIS — J40 Bronchitis, not specified as acute or chronic: Secondary | ICD-10-CM | POA: Insufficient documentation

## 2016-03-01 MED ORDER — AZITHROMYCIN 250 MG PO TABS: 250.0000 mg | ORAL_TABLET | Freq: Every day | ORAL | Status: DC

## 2016-03-01 NOTE — Discharge Instructions (Signed)
Continue to stay well-hydrated. Gargle warm salt water and spit it out. Use chloraseptic spray as needed for sore throat. Continue to alternate between Tylenol and Ibuprofen for pain or fever. Use Mucinex for cough suppression/expectoration of mucus. Use netipot and flonase to help with nasal congestion. May consider over-the-counter Benadryl or other antihistamine to decrease secretions and for watery itchy eyes. Take the zpak antibiotic as directed ONLY IF: symptoms persist after 2-3 days, symptoms improve but then worsen suddenly, or symptoms last greater than 2 weeks. Followup with your primary care doctor in 5-7 days for recheck of ongoing symptoms. Return to emergency department for emergent changing or worsening of symptoms.   Cough, Adult A cough helps to clear your throat and lungs. A cough may last only 2-3 weeks (acute), or it may last longer than 8 weeks (chronic). Many different things can cause a cough. A cough may be a sign of an illness or another medical condition. HOME CARE  Pay attention to any changes in your cough.  Take medicines only as told by your doctor.  If you were prescribed an antibiotic medicine, take it as told by your doctor. Do not stop taking it even if you start to feel better.  Talk with your doctor before you try using a cough medicine.  Drink enough fluid to keep your pee (urine) clear or pale yellow.  If the air is dry, use a cold steam vaporizer or humidifier in your home.  Stay away from things that make you cough at work or at home.  If your cough is worse at night, try using extra pillows to raise your head up higher while you sleep.  Do not smoke, and try not to be around smoke. If you need help quitting, ask your doctor.  Do not have caffeine.  Do not drink alcohol.  Rest as needed. GET HELP IF:  You have new problems (symptoms).  You cough up yellow fluid (pus).  Your cough does not get better after 2-3 weeks, or your cough gets  worse.  Medicine does not help your cough and you are not sleeping well.  You have pain that gets worse or pain that is not helped with medicine.  You have a fever.  You are losing weight and you do not know why.  You have night sweats. GET HELP RIGHT AWAY IF:  You cough up blood.  You have trouble breathing.  Your heartbeat is very fast.   This information is not intended to replace advice given to you by your health care provider. Make sure you discuss any questions you have with your health care provider.   Document Released: 05/12/2011 Document Revised: 05/20/2015 Document Reviewed: 11/05/2014 Elsevier Interactive Patient Education 2016 [employer]  Pharyngitis Pharyngitis is a sore throat (pharynx). There is redness, pain, and swelling of your throat. HOME CARE   Drink enough fluids to keep your pee (urine) clear or pale yellow.  Only take medicine as told by your doctor.  You may get sick again if you do not take medicine as told. Finish your medicines, even if you start to feel better.  Do not take aspirin.  Rest.  Rinse your mouth (gargle) with salt water ( tsp of salt per 1 qt of water) every 1-2 hours. This will help the pain.  If you are not at risk for choking, you can suck on hard candy or sore throat lozenges. GET HELP IF:  You have large, tender lumps on your neck.  You have a rash.  You cough up green, yellow-brown, or bloody spit. GET HELP RIGHT AWAY IF:   You have a stiff neck.  You drool or cannot swallow liquids.  You throw up (vomit) or are not able to keep medicine or liquids down.  You have very bad pain that does not go away with medicine.  You have problems breathing (not from a stuffy nose). MAKE SURE YOU:   Understand these instructions.  Will watch your condition.  Will get help right away if you are not doing well or get worse.   This information is not intended to replace advice given to you by your health care  provider. Make sure you discuss any questions you have with your health care provider.   Document Released: 02/15/2008 Document Revised: 06/19/2013 Document Reviewed: 05/06/2013 Elsevier Interactive Patient Education 2016 Else[employer] Inc.  Sore Throat A sore throat is a painful, burning, sore, or scratchy feeling of the throat. There may be pain or tenderness when swallowing or talking. You may have other symptoms with a sore throat. These include coughing, sneezing, fever, or a swollen neck. A sore throat is often the first sign of another sickness. These sicknesses may include a cold, flu, strep throat, or an infection called mono. Most sore throats go away without medical treatment.  HOME CARE   Only take medicine as told by your doctor.  Drink enough fluids to keep your pee (urine) clear or pale yellow.  Rest as needed.  Try using throat sprays, lozenges, or suck on hard candy (if older than 4 years or as told).  Sip warm liquids, such as broth, herbal tea, or warm water with honey. Try sucking on frozen ice pops or drinking cold liquids.  Rinse the mouth (gargle) with salt water. Mix 1 teaspoon salt with 8 ounces of water.  Do not smoke. Avoid being around others when they are smoking.  Put a humidifier in your bedroom at night to moisten the air. You can also turn on a hot shower and sit in the bathroom for 5-10 minutes. Be sure the bathroom door is closed. GET HELP RIGHT AWAY IF:   You have trouble breathing.  You cannot swallow fluids, soft foods, or your spit (saliva).  You have more puffiness (swelling) in the throat.  Your sore throat does not get better in 7 days.  You feel sick to your stomach (nauseous) and throw up (vomit).  You have a fever or lasting symptoms for more than 2-3 days.  You have a fever and your symptoms suddenly get worse. MAKE SURE YOU:   Understand these instructions.  Will watch your condition.  Will get help right away if you are not  doing well or get worse.   This information is not intended to replace advice given to you by your health care provider. Make sure you discuss any questions you have with your health care provider.   Document Released: 06/07/2008 Document Revised: 05/23/2012 Document Reviewed: 05/06/2012 Elsevier Interactive Patient Education 2016 [employer] Inc.  Upper Respiratory Infection, Adult Most upper respiratory infections (URIs) are caused by a virus. A URI affects the nose, throat, and upper air passages. The most common type of URI is often called "the common cold." HOME CARE   Take medicines only as told by your doctor.  Gargle warm saltwater or take cough drops to comfort your throat as told by your doctor.  Use a warm mist humidifier or inhale steam from a shower to increase  air moisture. This may make it easier to breathe.  Drink enough fluid to keep your pee (urine) clear or pale yellow.  Eat soups and other clear broths.  Have a healthy diet.  Rest as needed.  Go back to work when your fever is gone or your doctor says it is okay.  You may need to stay home longer to avoid giving your URI to others.  You can also wear a face mask and wash your hands often to prevent spread of the virus.  Use your inhaler more if you have asthma.  Do not use any tobacco products, including cigarettes, chewing tobacco, or electronic cigarettes. If you need help quitting, ask your doctor. GET HELP IF:  You are getting worse, not better.  Your symptoms are not helped by medicine.  You have chills.  You are getting more short of breath.  You have brown or red mucus.  You have yellow or brown discharge from your nose.  You have pain in your face, especially when you bend forward.  You have a fever.  You have puffy (swollen) neck glands.  You have pain while swallowing.  You have white areas in the back of your throat. GET HELP RIGHT AWAY IF:   You have very bad or  constant:  Headache.  Ear pain.  Pain in your forehead, behind your eyes, and over your cheekbones (sinus pain).  Chest pain.  You have long-lasting (chronic) lung disease and any of the following:  Wheezing.  Long-lasting cough.  Coughing up blood.  A change in your usual mucus.  You have a stiff neck.  You have changes in your:  Vision.  Hearing.  Thinking.  Mood. MAKE SURE YOU:   Understand these instructions.  Will watch your condition.  Will get help right away if you are not doing well or get worse.   This information is not intended to replace advice given to you by your health care provider. Make sure you discuss any questions you have with your health care provider.   Document Released: 02/15/2008 Document Revised: 01/13/2015 Document Reviewed: 12/04/2013 Elsevier Interactive Patient Education [employer]

## 2016-03-01 NOTE — ED Provider Notes (Signed)
CSN: [id number]     Arrival date & time 03/01/16  0845 History   First MD Initiated Contact with Patient 03/01/16 [id number]     Chief Complaint  Patient presents with  . Fever  . Sore Throat     (Consider location/radiation/quality/duration/timing/severity/associated sxs/prior Treatment) HPI Comments: Sabrina [name] is a 50 y.o. 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 Patient states that she gets bronchitis twice a year and this feels similar to the episodes. Her symptoms include cough with yellow sputum production, fever with Tmax 101, sore throat, body aches, and sinus congestion. She describes her sore throat as 3/10 constant nonradiating soreness in the posterior throat, worse with eating, and improved somewhat with fluids and Tylenol. She has been taking Mucinex with minimal relief. Denies history of asthma or COPD, denies smoking, denies sick contacts or recent travel. She was unable to get a PCP appointment today so she came to the ER. She has used azithromycin in the past when she has had these symptoms, but only when they seem to improve drastically worsen. Otherwise she has been able to handle her bronchitis with symptom control alone.  She denies any chest pain, shortness breath, wheezing, trismus, drooling, difficulty swallowing, abdominal pain, nausea vomiting, diarrhea, constipation, dysuria, hematuria, numbness, tingling, weakness, arthralgias, rashes, or ear pain or drainage.  Patient is a 50 y.o. female presenting with fever and pharyngitis. The history is provided by the patient. No language interpreter was used.  Fever Associated symptoms: cough, myalgias (body aches) and sore throat   Associated symptoms: no chest pain, no confusion, no diarrhea, no dysuria, no ear pain, no nausea, no rash, no rhinorrhea and no  vomiting   Sore Throat This is a new problem. The current episode started in the past 7 days. The problem occurs constantly. The problem has been unchanged. Associated symptoms include coughing, a fever (Tmax 101), myalgias (body aches) and a sore throat. Pertinent negatives include no abdominal pain, arthralgias, chest pain, nausea, numbness, rash, urinary symptoms, vomiting or weakness. The symptoms are aggravated by swallowing. She has tried acetaminophen (cold liquids and tylenol) for the symptoms. The treatment provided moderate relief.    Past Medical History  Diagnosis Date  . Bullous pemphigus   . Staph infection   . Cat bite   . 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 Situational anxiety   . PONV (postoperative nausea and vomiting)   . Arthritis    Past Surgical History  Procedure Laterality Date  . Abdominal hysterectomy    . Appendectomy    . Skin graft    . 22 surgeries on left elbow    . Debridement and closure wound Left 07/01/2015    Procedure: LEFT ELBOW EXCISION OF WOUND WITH PRIMARY CLOSURE 2X5 CM ;  Surgeon: Peggye Formlaire S Dillingham, DO;  Location: [facility];  Service: Plastics;  Laterality: Left;  . I&d extremity Left 07/08/2015    Procedure: IRRIGATION AND DEBRIDEMENT EXTREMITY, DRAINAGE OF LEFT ARM WOUND, A-CELL PLACEMENT, WOUND VAC PLACEMENT;  Surgeon: Alena Billslaire S Dillingham, DO;  Location: [facility];  Service: Plastics;  Laterality: Left;  . Incision and drainage of wound Left 08/05/2015    Procedure: IRRIGATION AND DEBRIDEMENT LEFT ELBOW WOUND, PLACEMENT OF ACELL;  Surgeon: Peggye Formlaire S Dillingham, DO;  Location: [facility];  Service: Plastics;  Laterality: Left;  .  Application of a-cell of extremity Left 08/05/2015    Procedure: APPLICATION OF A-CELL OF EXTREMITY;  Surgeon: Peggye Form, DO;  Location: [facility];  Service: Plastics;  Laterality: Left;   Family History  Problem Relation Age of Onset  . Liver disease Mother   .  Dementia Mother   . Cancer Mother   . Cancer Maternal Grandmother    Social History  Substance Use Topics  . Smoking status: Never Smoker   . Smokeless tobacco: Never Used  . Alcohol Use: No   OB History    No data available     Review of Systems  Constitutional: Positive for fever (Tmax 101).  HENT: Positive for sinus pressure and sore throat. Negative for drooling, ear discharge, ear pain, rhinorrhea and trouble swallowing.   Respiratory: Positive for cough. Negative for shortness of breath and wheezing.   Cardiovascular: Negative for chest pain.  Gastrointestinal: Negative for nausea, vomiting, abdominal pain, diarrhea and constipation.  Genitourinary: Negative for dysuria and hematuria.  Musculoskeletal: Positive for myalgias (body aches). Negative for arthralgias.  Skin: Negative for rash.  Allergic/Immunologic: Negative for immunocompromised state.  Neurological: Negative for weakness and numbness.  Psychiatric/Behavioral: Negative for confusion.   10 Systems reviewed and are negative for acute change except as noted in the HPI.    Allergies  Morphine and related; Tessalon; Zofran; and Droperidol  Home Medications   Prior to Admission medications   Medication Sig Start Date End Date Taking? Authorizing Provider  gabapentin (NEURONTIN) 300 MG capsule TAKE 2 CAPSULES BY MOUTH 3 TIMES DAILY 01/18/16   [name], NP  ibuprofen (ADVIL,MOTRIN) 200 MG tablet Take 400 mg by mouth every 6 (six) hours as needed for headache or moderate pain. Reported on 01/18/2016    Historical Provider, MD  oxymetazoline (AFRIN NASAL SPRAY) 0.05 % nasal spray Place 1 spray into both nostrils 2 (two) times daily. Patient not taking: Reported on 01/18/2016 11/22/15   Barrett Henle, PA-C  promethazine-dextromethorphan (PROMETHAZINE-DM) 6.25-15 MG/5ML syrup Take 5 mLs by mouth 4 (four) times daily as needed for cough. Patient not taking: Reported on 01/18/2016 11/19/15   Charlestine Night,  PA-C   BP 117/70 mmHg  Pulse 99  Temp(Src) 98.7 F (37.1 C) (Oral)  Resp 18  SpO2 99% Physical Exam  Constitutional: She is oriented to person, place, and time. Vital signs are normal. She appears well-developed and well-nourished.  Non-toxic appearance. No distress.  Afebrile, nontoxic, NAD  HENT:  Head: Normocephalic and atraumatic.  Nose: Nose normal.  Mouth/Throat: Uvula is midline and mucous membranes are normal. No trismus in the jaw. No uvula swelling. Posterior oropharyngeal erythema present. No oropharyngeal exudate, posterior oropharyngeal edema or tonsillar abscesses.  Nose clear. Oropharynx with minimal erythema posteriorly, without uvular swelling or deviation, no trismus or drooling, no tonsils present, no oropharynx swelling or evidence of PTA, no exudates.    Eyes: Conjunctivae and EOM are normal. Right eye exhibits no discharge. Left eye exhibits no discharge.  Neck: Normal range of motion. Neck supple.  Cardiovascular: Normal rate, regular rhythm, normal heart sounds and intact distal pulses.  Exam reveals no gallop and no friction rub.   No murmur heard. Pulmonary/Chest: Effort normal and breath sounds normal. No respiratory distress. She has no decreased breath sounds. She has no wheezes. She has no rhonchi. She has no rales.  CTAB in all lung fields, no w/r/r, no hypoxia or increased WOB, speaking in full sentences, SpO2 99% on RA  Abdominal: Soft. Normal appearance and bowel sounds are normal. She exhibits no distension. There is no tenderness. There is no rigidity, no rebound, no guarding, no CVA tenderness, no tenderness at McBurney's point and negative Murphy's sign.  Musculoskeletal: Normal range of motion.  Neurological: She is alert and oriented to person, place, and time. She has normal strength. No sensory deficit.  Skin: Skin is warm, dry and intact. No rash noted.  Psychiatric: She has a normal mood and affect.  Nursing note and vitals reviewed.   ED  Course  Procedures (including critical care time) Labs Review Labs Reviewed - No data to display  Imaging Review Dg Chest 2 View  03/01/2016  CLINICAL DATA:  Cough. Upper chest discomfort worsening since Saturday. EXAM: CHEST  2 VIEW COMPARISON:  11/22/2015 FINDINGS: The heart size and mediastinal contours are within normal limits. Both lungs are clear. The visualized skeletal structures are unremarkable. IMPRESSION: No active cardiopulmonary disease. Electronically Signed   By: Gaylyn Rong M.D.   On: 03/01/2016 09:36   I have personally reviewed and evaluated these images and lab results as part of my medical decision-making.   EKG Interpretation None      MDM   Final diagnoses:  URI (upper respiratory infection)  Sore throat  Cough  Bronchitis  Other specified fever  Body aches    50 y.o. female here with URI symptoms x3 days, cough, congestion, sore throat, body aches, fever Tmax 101. Feels like when she gets bronchitis. Has needed zpak in the past for bronchitis. On exam, no tonsils present, posterior oropharynx mildly erythematous, no PTA; clear lungs. Overall well appearing, afebrile. CXR in triage neg. Doubt need for testing for strep. Discussed that it's likely viral, but could still be potentially bacterial, will give safety script for zpak only to start if symptoms persisting >3-4 days from now, >2wks, or improving then worsening over the next several days. Otherwise, OTC symptom meds for control of symptoms. F/up with PCP in 1wk. Salt water gargles discussed. I explained the diagnosis and have given explicit precautions to return to the ER including for any other new or worsening symptoms. The patient understands and accepts the medical plan as it's been dictated and I have answered their questions. Discharge instructions concerning home care and prescriptions have been given. The patient is STABLE and is discharged to home in good condition.  BP 117/70 mmHg  Pulse 99   Temp(Src) 98.7 F (37.1 C) (Oral)  Resp 18  SpO2 99%  Meds ordered this encounter  Medications  . azithromycin (ZITHROMAX) 250 MG tablet    Sig: Take 1 tablet (250 mg total) by mouth daily. Take first 2 tablets together, then 1 every day until finished. TAKE ONLY IF SYMPTOMS PERSISTING IN 2-3 DAYS, OR IF HAVING WORSENING SYMPTOMS    Dispense:  6 tablet    Refill:  0    Order Specific Question:  Supervising Provider    Answer:  Angus Seller Camprubi-Soms, PA-C 03/01/16 1018  Zadie Rhine, MD 03/02/16 [phone]

## 2016-03-01 NOTE — ED Notes (Signed)
Verbalized understanding discharge instructions, prescriptions, and follow-up. In no acute distress.   

## 2016-03-01 NOTE — ED Notes (Signed)
Pt with fever and sore throat since Saturday. Highest 101.  Pt also with cough x 2 nights.

## 2016-03-03 MED FILL — GABAPENTIN 300 MG CAPSULE: 300 | 30 days supply | Qty: 180 | Fill #2

## 2016-03-03 MED FILL — AZITHROMYCIN 250 MG TABLET: 250 | 5 days supply | Qty: 6 | Fill #0

## 2016-03-09 ENCOUNTER — Encounter (HOSPITAL_COMMUNITY): Payer: Self-pay | Admitting: Emergency Medicine

## 2016-03-09 ENCOUNTER — Emergency Department (HOSPITAL_COMMUNITY)
Admission: EM | Admit: 2016-03-09 | Discharge: 2016-03-09 | Disposition: A | Discharge status: Home / Self Care | Payer: Self-pay | Attending: Emergency Medicine | Admitting: Emergency Medicine

## 2016-03-09 DIAGNOSIS — M1990 Unspecified osteoarthritis, unspecified site: Secondary | ICD-10-CM | POA: Insufficient documentation

## 2016-03-09 DIAGNOSIS — J069 Acute upper respiratory infection, unspecified: Secondary | ICD-10-CM

## 2016-03-09 DIAGNOSIS — H6691 Otitis media, unspecified, right ear: Secondary | ICD-10-CM

## 2016-03-09 MED ORDER — AMOXICILLIN-POT CLAVULANATE 875-125 MG PO TABS: 1.0000 | ORAL_TABLET | Freq: Two times a day (BID) | ORAL | Status: DC

## 2016-03-09 MED FILL — traMADol HCL 50 MG TABS: 50 | 15 days supply | Qty: 60 | Fill #0

## 2016-03-09 NOTE — ED Notes (Signed)
PA at bedside.

## 2016-03-09 NOTE — Discharge Instructions (Signed)
Take your medications as prescribed. Follow-up with your doctor for regular scheduled appointment. Return to ED for any new or worsening symptoms as we discussed.  Upper Respiratory Infection, Adult Most upper respiratory infections (URIs) are a viral infection of the air passages leading to the lungs. A URI affects the nose, throat, and upper air passages. The most common type of URI is nasopharyngitis and is typically referred to as "the common cold." URIs run their course and usually go away on their own. Most of the time, a URI does not require medical attention, but sometimes a bacterial infection in the upper airways can follow a viral infection. This is called a secondary infection. Sinus and middle ear infections are common types of secondary upper respiratory infections. Bacterial pneumonia can also complicate a URI. A URI can worsen asthma and chronic obstructive pulmonary disease (COPD). Sometimes, these complications can require emergency medical care and may be life threatening.  CAUSES Almost all URIs are caused by viruses. A virus is a type of germ and can spread from one person to another.  RISKS FACTORS You may be at risk for a URI if:   You smoke.   You have chronic heart or lung disease.  You have a weakened defense (immune) system.   You are very young or very old.   You have nasal allergies or asthma.  You work in crowded or poorly ventilated areas.  You work in health care facilities or schools. SIGNS AND SYMPTOMS  Symptoms typically develop 2-3 days after you come in contact with a cold virus. Most viral URIs last 7-10 days. However, viral URIs from the influenza virus (flu virus) can last 14-18 days and are typically more severe. Symptoms may include:   Runny or stuffy (congested) nose.   Sneezing.   Cough.   Sore throat.   Headache.   Fatigue.   Fever.   Loss of appetite.   Pain in your forehead, behind your eyes, and over your cheekbones  (sinus pain).  Muscle aches.  DIAGNOSIS  Your health care provider may diagnose a URI by:  Physical exam.  Tests to check that your symptoms are not due to another condition such as:  Strep throat.  Sinusitis.  Pneumonia.  Asthma. TREATMENT  A URI goes away on its own with time. It cannot be cured with medicines, but medicines may be prescribed or recommended to relieve symptoms. Medicines may help:  Reduce your fever.  Reduce your cough.  Relieve nasal congestion. HOME CARE INSTRUCTIONS   Take medicines only as directed by your health care provider.   Gargle warm saltwater or take cough drops to comfort your throat as directed by your health care provider.  Use a warm mist humidifier or inhale steam from a shower to increase air moisture. This may make it easier to breathe.  Drink enough fluid to keep your urine clear or pale yellow.   Eat soups and other clear broths and maintain good nutrition.   Rest as needed.   Return to work when your temperature has returned to normal or as your health care provider advises. You may need to stay home longer to avoid infecting others. You can also use a face mask and careful hand washing to prevent spread of the virus.  Increase the usage of your inhaler if you have asthma.   Do not use any tobacco products, including cigarettes, chewing tobacco, or electronic cigarettes. If you need help quitting, ask your health care provider. PREVENTION  The best way to protect yourself from getting a cold is to practice good hygiene.   Avoid oral or hand contact with people with cold symptoms.   Wash your hands often if contact occurs.  There is no clear evidence that vitamin C, vitamin E, echinacea, or exercise reduces the chance of developing a cold. However, it is always recommended to get plenty of rest, exercise, and practice good nutrition.  SEEK MEDICAL CARE IF:   You are getting worse rather than better.   Your  symptoms are not controlled by medicine.   You have chills.  You have worsening shortness of breath.  You have brown or red mucus.  You have yellow or brown nasal discharge.  You have pain in your face, especially when you bend forward.  You have a fever.  You have swollen neck glands.  You have pain while swallowing.  You have white areas in the back of your throat. SEEK IMMEDIATE MEDICAL CARE IF:   You have severe or persistent:  Headache.  Ear pain.  Sinus pain.  Chest pain.  You have chronic lung disease and any of the following:  Wheezing.  Prolonged cough.  Coughing up blood.  A change in your usual mucus.  You have a stiff neck.  You have changes in your:  Vision.  Hearing.  Thinking.  Mood. MAKE SURE YOU:   Understand these instructions.  Will watch your condition.  Will get help right away if you are not doing well or get worse.   This information is not intended to replace advice given to you by your health care provider. Make sure you discuss any questions you have with your health care provider.   Document Released: 02/22/2001 Document Revised: 01/13/2015 Document Reviewed: 12/04/2013 Elsevier Interactive Patient Education [employer]

## 2016-03-09 NOTE — ED Notes (Signed)
Pt c/o right ear pain, fullness, persistant cough, sore throat onset 2 weeks ago.

## 2016-03-09 NOTE — ED Provider Notes (Signed)
CSN: [id number]     Arrival date & time 03/09/16  1642 History  By signing my name below, I, Sabrina [name], attest that this documentation has been prepared under the direction and in the presence of General MillsBenjamin Jaynell Castagnola, PA-C. Electronically Signed: Evon Slackerrance [name], ED Scribe. 03/09/2016. 6:26 PM.      Chief Complaint  Patient presents with  . Otalgia   Patient is a 50 y.o. female presenting with ear pain. The history is provided by the patient. No language interpreter was used.  Otalgia Associated symptoms: cough and sore throat    HPI Comments: Sabrina [name] is a 50 y.o. female who presents to the Emergency Department complaining of cough onset 2 weeks prior. She states that the cough is productive only at night of yellowish sputum. She reports associated right ear pain and improving sore throat. Pt states that she has tried several OTC treatments with no relief. Pt states she has also had azithromycin with no relief. Pt denies fever, chills, CP, SOB, Dizziness or other related symptoms. Pt states she has a Hx of bronchitis. Pt reports allergies to morphine, tessalon, Zofran and droperidol. She denies hx of environmental allergies.   Past Medical History  Diagnosis Date  . Bullous pemphigus   . Staph infection   . Cat bite   . 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 Situational anxiety   . PONV (postoperative nausea and vomiting)   . Arthritis    Past Surgical History  Procedure Laterality Date  . Abdominal hysterectomy    . Appendectomy    . Skin graft    . 22 surgeries on left elbow    . Debridement and closure wound Left 07/01/2015    Procedure: LEFT ELBOW EXCISION OF WOUND WITH PRIMARY CLOSURE 2X5 CM ;  Surgeon: Peggye Formlaire S Dillingham, DO;  Location: [facility];  Service: Plastics;  Laterality: Left;  . I&d extremity Left 07/08/2015    Procedure: IRRIGATION AND DEBRIDEMENT EXTREMITY, DRAINAGE OF LEFT ARM WOUND, A-CELL PLACEMENT, WOUND VAC PLACEMENT;  Surgeon: Alena Billslaire S  Dillingham, DO;  Location: [facility];  Service: Plastics;  Laterality: Left;  . Incision and drainage of wound Left 08/05/2015    Procedure: IRRIGATION AND DEBRIDEMENT LEFT ELBOW WOUND, PLACEMENT OF ACELL;  Surgeon: Peggye Formlaire S Dillingham, DO;  Location: [facility];  Service: Plastics;  Laterality: Left;  . Application of a-cell of extremity Left 08/05/2015    Procedure: APPLICATION OF A-CELL OF EXTREMITY;  Surgeon: Peggye Formlaire S Dillingham, DO;  Location:  SUR[facility]ERY [facility];  Service: Plastics;  Laterality: Left;   Family History  Problem Relation Age of Onset  . Liver disease Mother   . Dementia Mother   . Cancer Mother   . Cancer Maternal Grandmother    Social History  Substance Use Topics  . Smoking status: Never Smoker   . Smokeless tobacco: Never Used  . Alcohol Use: No   OB History    No data available     Review of Systems  HENT: Positive for ear pain and sore throat.   Respiratory: Positive for cough.    A complete 10 system review of systems was obtained and all systems are negative except as noted in the HPI and PMH.     Allergies  Morphine and related; Tessalon; Zofran; and Droperidol  Home Medications   Prior to Admission medications   Medication Sig Start Date End Date Taking? Authorizing Provider  acetaminophen (TYLENOL) 500 MG tablet Take 1,000 mg by mouth  every 6 (six) hours as needed for mild pain, moderate pain, fever or headache.    Historical Provider, MD  amoxicillin-clavulanate (AUGMENTIN) 875-125 MG tablet Take 1 tablet by mouth every 12 (twelve) hours. 03/09/16   Joycie PeekBenjamin Joletta Manner, PA-C  azithromycin (ZITHROMAX) 250 MG tablet Take 1 tablet (250 mg total) by mouth daily. Take first 2 tablets together, then 1 every day until finished. TAKE ONLY IF SYMPTOMS PERSISTING IN 2-3 DAYS, OR IF HAVING WORSENING SYMPTOMS 03/01/16   [name], PA-C  gabapentin (NEURONTIN) 300 MG capsule TAKE 2 CAPSULES BY MOUTH 3 TIMES DAILY Patient not  taking: Reported on 03/01/2016 01/18/16   Henrietta HooverLinda C [name], NP  gabapentin (NEURONTIN) 400 MG capsule Take 400 mg by mouth 4 (four) times daily.    Historical Provider, MD  oxymetazoline (AFRIN NASAL SPRAY) 0.05 % nasal spray Place 1 spray into both nostrils 2 (two) times daily. Patient not taking: Reported on 01/18/2016 11/22/15   Barrett HenleNicole Elizabeth Nadeau, PA-C  Phenylephrine-DM-GG [facility](MUCINEX FAST-MAX CONGEST COUGH) 2.5-5-100 MG/5ML LIQD Take 10 mLs by mouth 2 (two) times daily as needed (for cold).    Historical Provider, MD  promethazine-dextromethorphan (PROMETHAZINE-DM) 6.25-15 MG/5ML syrup Take 5 mLs by mouth 4 (four) times daily as needed for cough. Patient not taking: Reported on 01/18/2016 11/19/15   [name] Lawyer, PA-C   BP 94/75 mmHg  Pulse 115  Temp(Src) 98.7 F (37.1 C) (Oral)  Resp 16  SpO2 98%   Physical Exam  Constitutional: She is oriented to person, place, and time. She appears well-developed and well-nourished. No distress.  HENT:  Head: Normocephalic and atraumatic.  Mouth/Throat: Oropharynx is clear and moist.  Right ear with erythema and effusion consistent with otitis media.  Eyes: Conjunctivae and EOM are normal.  Neck: Neck supple. No tracheal deviation present.  Cardiovascular: Normal rate, regular rhythm and normal heart sounds.   Pulmonary/Chest: Effort normal and breath sounds normal. No respiratory distress. She has no wheezes. She has no rales.  Musculoskeletal: Normal range of motion.  Neurological: She is alert and oriented to person, place, and time.  Skin: Skin is warm and dry.  Psychiatric: She has a normal mood and affect. Her behavior is normal.  Nursing note and vitals reviewed.   ED Course  Procedures (including critical care time) DIAGNOSTIC STUDIES: Oxygen Saturation is 98% on RA, normal by my interpretation.    COORDINATION OF CARE: 6:24 PM-Discussed treatment plan with pt at bedside and pt agreed to plan.     Labs Review Labs Reviewed -  No data to display  Imaging Review No results found.   EKG Interpretation None      MDM  Patients symptoms are consistent with URI. Also has concomitant right-sided otitis media. Will DC with antibiotics, patient has follow-up appointment with her PCP on July 7. Discussed further symptomatic support at home.   Verbalizes understanding and is agreeable with plan. Pt is hemodynamically stable & in NAD prior to dc. She appears very well, no tachycardia on my exam, heart rate mid 90s.   Final diagnoses:  URI (upper respiratory infection)  Right otitis media, recurrence not specified, unspecified chronicity, unspecified otitis media type      I personally performed the services described in this documentation, which was scribed in my presence. The recorded information has been reviewed and is accurate.      Joycie PeekBenjamin Cannen Dupras, PA-C 03/09/16 1908  Derwood KaplanAnkit Nanavati, MD 03/10/16 0000

## 2016-03-22 MED FILL — traMADol HCL 50 MG TABS: 50 | 15 days supply | Qty: 60 | Fill #1

## 2016-04-04 MED FILL — GABAPENTIN 300 MG CAPSULE: 300 | 30 days supply | Qty: 180 | Fill #1

## 2016-04-08 ENCOUNTER — Encounter (HOSPITAL_COMMUNITY): Payer: Self-pay | Admitting: *Deleted

## 2016-04-08 ENCOUNTER — Emergency Department (HOSPITAL_COMMUNITY)
Admission: EM | Admit: 2016-04-08 | Discharge: 2016-04-08 | Disposition: A | Discharge status: Home / Self Care | Payer: Self-pay | Attending: Emergency Medicine | Admitting: Emergency Medicine

## 2016-04-08 DIAGNOSIS — S60460A Insect bite (nonvenomous) of right index finger, initial encounter: Secondary | ICD-10-CM | POA: Insufficient documentation

## 2016-04-08 DIAGNOSIS — Y999 Unspecified external cause status: Secondary | ICD-10-CM | POA: Insufficient documentation

## 2016-04-08 DIAGNOSIS — Y92039 Unspecified place in apartment as the place of occurrence of the external cause: Secondary | ICD-10-CM | POA: Insufficient documentation

## 2016-04-08 DIAGNOSIS — T148XXA Other injury of unspecified body region, initial encounter: Secondary | ICD-10-CM

## 2016-04-08 DIAGNOSIS — T63001A Toxic effect of unspecified snake venom, accidental (unintentional), initial encounter: Secondary | ICD-10-CM

## 2016-04-08 DIAGNOSIS — Y939 Activity, unspecified: Secondary | ICD-10-CM | POA: Insufficient documentation

## 2016-04-08 DIAGNOSIS — W57XXXA Bitten or stung by nonvenomous insect and other nonvenomous arthropods, initial encounter: Secondary | ICD-10-CM | POA: Insufficient documentation

## 2016-04-08 HISTORY — DX: Toxic effect of unspecified snake venom, accidental (unintentional), initial encounter: T63.001A

## 2016-04-08 MED ORDER — AMOXICILLIN-POT CLAVULANATE 875-125 MG PO TABS: 1.0000 | ORAL_TABLET | Freq: Two times a day (BID) | ORAL | 0 refills | Status: DC

## 2016-04-08 MED ORDER — HYDROCODONE-ACETAMINOPHEN 5-325 MG PO TABS: 1.0000 | ORAL_TABLET | ORAL | 0 refills | Status: DC | PRN

## 2016-04-08 MED ORDER — HYDROCODONE-ACETAMINOPHEN 5-325 MG PO TABS
1.0000 | ORAL_TABLET | Freq: Once | ORAL | Status: AC
  Administered 2016-04-08: 1 via ORAL
  Filled 2016-04-08: qty 1

## 2016-04-08 NOTE — ED Notes (Signed)
Measured site circumference (per Poison control recommendation to be done ever 1 hour for 3 hours) at 1500 it was 7 cm around the finger (1 cm lower from puncture marks)

## 2016-04-08 NOTE — ED Notes (Signed)
Per PA Heather, pt will have to be observed for 6 hours. Charge RN made aware. Pt was given crackers and Sprite, affected extremity elevated.

## 2016-04-08 NOTE — ED Provider Notes (Signed)
[facility]-EMERGENCY DEPT Provider Note   CSN: [id number] Arrival date & time: 04/08/16  1310  First Provider Contact:  First MD Initiated Contact with Patient 04/08/16 1337        By signing my name below, I, Doreatha Martin, attest that this documentation has been prepared under the direction and in the presence of  Mahoganie Basher, PA-C. Electronically Signed: Doreatha Martin, ED Scribe. 04/08/16. 2:12 PM.    History   Chief Complaint Chief Complaint  Patient presents with  . Animal Bite    possible    HPI Sabrina [name] is a 50 y.o. female who is right hand dominant, who presents to the Emergency Department complaining of a possible snake bite to the right index finger that occurred at 12:30pm. Pt states she was outside her apartment pursuing her cat into a grass-covered ditch when she reached down and suddenly felt a sting on her right pointer finger. Per pt, she did not see what caused this sting, but believes it was a snake as she now has two puncture marks to her right pointer finger. Pt states that since this sting, she has throbbing pain to the area and a burning sensation extending up her arm. Pt states she cleaned the wound with soap and water, applied ice to the area, took Benadryl, Pepcid and 50 mg hydroxyzine PTA. Pt states her boss is a Administrator, [employer] and agrees that the wound appears consistent with fang marks. Tdap UTD. Pt also notes that a previous cat bite resulted in osteomyelitis. She denies numbness, nausea, emesis, additional symptoms.    The history is provided by the patient. No language interpreter was used.    Past Medical History:  Diagnosis Date  . Arthritis   . Bullous pemphigus   . Cat bite   . 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 PONV (postoperative nausea and vomiting)   . Situational anxiety   . Staph infection     Patient Active Problem List   Diagnosis Date Noted  . Medical non-compliance 10/05/2015  . Elbow pain 07/20/2015  . Anxiety state 07/20/2015  . Wound  infection (HCC) 07/04/2015  . Dehydration 07/04/2015  . Tachycardia 07/04/2015  . Diarrhea 07/04/2015  . Wound infection after surgery 07/04/2015  . 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HCC)   . Cat bite of forearm 07/01/2015  . Skin ulcer of upper arm, limited to breakdown of skin (HCC) 07/01/2015  . Cellulitis of upper arm 03/10/2015    Past Surgical History:  Procedure Laterality Date  . 22 surgeries on left elbow    . ABDOMINAL HYSTERECTOMY    . APPENDECTOMY    . APPLICATION OF A-CELL OF EXTREMITY Left 08/05/2015   Procedure: APPLICATION OF A-CELL OF EXTREMITY;  Surgeon: Peggye Form, DO;  Location: [facility];  Service: Plastics;  Laterality: Left;  . DEBRIDEMENT AND CLOSURE WOUND Left 07/01/2015   Procedure: LEFT ELBOW EXCISION OF WOUND WITH PRIMARY CLOSURE 2X5 CM ;  Surgeon: Peggye Form, DO;  Location: [facility];  Service: Plastics;  Laterality: Left;  . I&D EXTREMITY Left 07/08/2015   Procedure: IRRIGATION AND DEBRIDEMENT EXTREMITY, DRAINAGE OF LEFT ARM WOUND, A-CELL PLACEMENT, WOUND VAC PLACEMENT;  Surgeon: Alena Bills Dillingham, DO;  Location: [facility];  Service: Plastics;  Laterality: Left;  . INCISION AND DRAINAGE OF WOUND Left 08/05/2015   Procedure: IRRIGATION AND DEBRIDEMENT LEFT ELBOW WOUND, PLACEMENT OF ACELL;  Surgeon: Peggye Form, DO;  Location: [facility];  Service: Plastics;  Laterality: Left;  . SKIN GRAFT      OB History    No data available       Home Medications    Prior to Admission medications   Medication Sig Start Date End Date Taking? Authorizing Provider  acetaminophen (TYLENOL) 500 MG tablet Take 1,000 mg by mouth every 6 (six) hours as needed for mild pain, moderate pain, fever or headache.    Historical Provider, MD  amoxicillin-clavulanate (AUGMENTIN) 875-125 MG tablet Take 1 tablet by mouth every 12 (twelve) hours. 03/09/16   [name], PA-C  azithromycin (ZITHROMAX) 250 MG tablet Take 1  tablet (250 mg total) by mouth daily. Take first 2 tablets together, then 1 every day until finished. TAKE ONLY IF SYMPTOMS PERSISTING IN 2-3 DAYS, OR IF HAVING WORSENING SYMPTOMS 03/01/16   [name], PA-C  gabapentin (NEURONTIN) 300 MG capsule TAKE 2 CAPSULES BY MOUTH 3 TIMES DAILY Patient not taking: Reported on 03/01/2016 01/18/16   [name], NP  gabapentin (NEURONTIN) 400 MG capsule Take 400 mg by mouth 4 (four) times daily.    Historical Provider, MD  oxymetazoline (AFRIN NASAL SPRAY) 0.05 % nasal spray Place 1 spray into both nostrils 2 (two) times daily. Patient not taking: Reported on 01/18/2016 11/22/15   Barrett Henle, PA-C  Phenylephrine-DM-GG [facility] FAST-MAX CONGEST COUGH) 2.5-5-100 MG/5ML LIQD Take 10 mLs by mouth 2 (two) times daily as needed (for cold).    Historical Provider, MD  promethazine-dextromethorphan (PROMETHAZINE-DM) 6.25-15 MG/5ML syrup Take 5 mLs by mouth 4 (four) times daily as needed for cough. Patient not taking: Reported on 01/18/2016 11/19/15   Charlestine Night, PA-C    Family History Family History  Problem Relation Age of Onset  . Liver disease Mother   . Dementia Mother   . Cancer Mother   . Cancer Maternal Grandmother     Social History Social History  Substance Use Topics  . Smoking status: Never Smoker  . Smokeless tobacco: Never Used  . Alcohol use No     Allergies   Morphine and related; Tessalon [benzonatate]; Zofran [ondansetron hcl]; and Droperidol   Review of Systems Review of Systems  Gastrointestinal: Negative for nausea and vomiting.  Musculoskeletal: Positive for myalgias.  Skin: Positive for wound.  Neurological: Negative for numbness.     Physical Exam Updated Vital Signs BP 117/81   Pulse 114   Temp 98.9 F (37.2 C) (Oral)   Resp 16   SpO2 98%   Physical Exam  Constitutional: She appears well-developed and well-nourished.  HENT:  Head: Normocephalic.  Eyes: Conjunctivae are normal.    Cardiovascular: Normal rate.   2+ radial pulse on the right.   Pulmonary/Chest: Effort normal. No respiratory distress.  Abdominal: She exhibits no distension.  Musculoskeletal: Normal range of motion.  Neurological: She is alert.  Distal sensation of the right hand intact.  Skin: Skin is warm and dry. There is erythema.  Two superficial symmetric erythematous puncture wounds, most consistent with fang bites to the radial aspect of the right index finger. No active drainage. Mild surrounding erythema. No edema or warmth of the area.    Psychiatric: She has a normal mood and affect. Her behavior is normal.  Nursing note and vitals reviewed.    ED Treatments / Results  Labs (all labs ordered are listed, but only abnormal results are displayed) Labs Reviewed - No data to display  EKG  EKG Interpretation None       Radiology No results found.  Procedures Procedures (including critical care time)  DIAGNOSTIC STUDIES: Oxygen Saturation is 98% on RA, normal by my interpretation.    COORDINATION OF CARE: 1:47 PM Discussed treatment plan with pt at bedside which includes poison control consult and pt agreed to plan.   1:58 PM Poison control contacted, who recommend observation for 6 hours following the bite.   Medications Ordered in ED Medications - No data to display   Initial Impression / Assessment and Plan / ED Course  I have reviewed the triage vital signs and the nursing notes.  Pertinent labs & imaging results that were available during my care of the patient were reviewed by me and considered in my medical decision making (see chart for details).  Clinical Course    Sabrina [name] presents to the ED for evaluation of a possible snake bite.  She has two puncture wounds noted to the finger consistent with possible fangs from a snake.  Poison control consulted for recommendation of further treatment. They recommended observation for six hours and circumferential  measurements of the finger every hour.  Measurements made and no difference in measurements over the course of the six hours.  Poison control did not recommend any labs or medications.  Pt observed in the ED for 6 hours following the bite. Pt tetanus UTD.  Conservative therapies discussed and recommended. Patient advised to follow up with PCP as needed, or with worsening symptoms. Patient appears stable for discharge at this time. Return precautions discussed and outlined in discharge paperwork. Patient is agreeable to plan.  Patient also discussed with Dr Dalene Seltzer.     Final Clinical Impressions(s) / ED Diagnoses   Final diagnoses:  None    New Prescriptions New Prescriptions   No medications on file   I personally performed the services described in this documentation, which was scribed in my presence. The recorded information has been reviewed and is accurate.    Santiago Glad, PA-C 04/08/16 2139    Alvira Monday, MD 04/09/16 [phone]

## 2016-04-08 NOTE — Progress Notes (Addendum)
Received report from Ajsa. Pt is pleasant and cooperative. Slight echymosis over the index joint of the right hand.Small area of echymosis above the joint. PA aware.  Positive small what appear to be fang marks from a snake. Pt is c/o a burning sensation going up her right arm to the antecubital area. No c/o itching and no difficulty breathing. Pt stated she was trying to rescue a cat in some underbrush and reached her hand in. Pt did request some pain medication. PA aware and stated she told poison control of the burning sensation the pt is experiencing,. Pt will be monitored times 6 hours. Positive radial pulse and good capillary refill to all fingers. Pain is a 5/10. (4:15pm)pt continues to elevate her right hand above her heart. Pt medicated with hydrocodone 1 pill. (4:30pm)measurment around finger is 7.2 mm (5pm) 5:55pm( measurement is 7.20mm) pt does have slight reddness at the knuckle site. Pt stated, "it has almost been 6 hours since the bite so I hope I can leave by 6:15p.Pt stated the pain is a little better .

## 2016-04-08 NOTE — ED Triage Notes (Signed)
Pt sts she was at work trying to help a cat outside, when she felt something bit her on her finger. Pt has what appears like fang marks and reports pain and burning radiating toward upper arm

## 2016-04-10 ENCOUNTER — Emergency Department (HOSPITAL_COMMUNITY)
Admission: EM | Admit: 2016-04-10 | Discharge: 2016-04-10 | Disposition: A | Discharge status: Home / Self Care | Payer: Self-pay | Attending: Emergency Medicine | Admitting: Emergency Medicine

## 2016-04-10 ENCOUNTER — Encounter (HOSPITAL_COMMUNITY): Payer: Self-pay | Admitting: Emergency Medicine

## 2016-04-10 DIAGNOSIS — T63004D Toxic effect of unspecified snake venom, undetermined, subsequent encounter: Secondary | ICD-10-CM

## 2016-04-10 DIAGNOSIS — T63001D Toxic effect of unspecified snake venom, accidental (unintentional), subsequent encounter: Secondary | ICD-10-CM | POA: Insufficient documentation

## 2016-04-10 DIAGNOSIS — X58XXXD Exposure to other specified factors, subsequent encounter: Secondary | ICD-10-CM | POA: Insufficient documentation

## 2016-04-10 LAB — CBC WITH DIFFERENTIAL/PLATELET
BASOS PCT: 0 %
Basophils Absolute: 0 10*3/uL (ref 0.0–0.1)
EOS PCT: 3 %
Eosinophils Absolute: 0.1 10*3/uL (ref 0.0–0.7)
HEMATOCRIT: 40.5 % (ref 36.0–46.0)
Hemoglobin: 12.9 g/dL (ref 12.0–15.0)
LYMPHS PCT: 44 %
Lymphs Abs: 1.8 10*3/uL (ref 0.7–4.0)
MCH: 30.1 pg (ref 26.0–34.0)
MCHC: 31.9 g/dL (ref 30.0–36.0)
MCV: 94.4 fL (ref 78.0–100.0)
MONO ABS: 0.3 10*3/uL (ref 0.1–1.0)
MONOS PCT: 7 %
NEUTROS ABS: 1.9 10*3/uL (ref 1.7–7.7)
Neutrophils Relative %: 46 %
PLATELETS: 257 10*3/uL (ref 150–400)
RBC: 4.29 MIL/uL (ref 3.87–5.11)
RDW: 13.8 % (ref 11.5–15.5)
WBC: 4 10*3/uL (ref 4.0–10.5)

## 2016-04-10 LAB — BASIC METABOLIC PANEL
Anion gap: 8 (ref 5–15)
BUN: 18 mg/dL (ref 6–20)
CO2: 24 mmol/L (ref 22–32)
Calcium: 9.2 mg/dL (ref 8.9–10.3)
Chloride: 108 mmol/L (ref 101–111)
Creatinine, Ser: 0.81 mg/dL (ref 0.44–1.00)
GFR calc Af Amer: >60 mL/min (ref >60)
GLUCOSE: 87 mg/dL (ref 65–99)
Potassium: 3.7 mmol/L (ref 3.5–5.1)
SODIUM: 140 mmol/L (ref 135–145)

## 2016-04-10 MED ORDER — HYDROCODONE-ACETAMINOPHEN 5-325 MG PO TABS: 2.0000 | ORAL_TABLET | ORAL | 0 refills | Status: DC | PRN

## 2016-04-10 NOTE — ED Triage Notes (Signed)
Was bitten by snake on Friday rt index finger seen  At [facility]  And tx , now pain is worse and she feels like it swelling worse, pt  Thinks that it was a copperhead

## 2016-04-10 NOTE — Discharge Instructions (Signed)
Continue to elevate.  Benadryl 3 times per day.   Motrin 400-800 mg twice a day.  [place] for pain.  Continue and finish your Augmentin

## 2016-04-10 NOTE — ED Triage Notes (Signed)
PT went to [facility]. 04-07-16  With snake bite to RT index finger. Pt reports she has used all the [employer]

## 2016-04-10 NOTE — ED Provider Notes (Signed)
[facility]-EMERG[facility]Y DEPT Provider Note   CSN: [id number] Arrival date & time: 04/10/16  1149  First Provider Contact:  3:01 PM       History   Chief Complaint Chief Complaint  Patient presents with  . Animal Bite    HPI Sabrina [name] is a 50 y.o. female.  She was seen 2 days ago [facility] with possible snake bite. She was reaching into some grass to pickup her cat. She felt a sharp pain on her right index finger. She saw 2 small punctures. She alleged this was a snake. She did not visualize a snake. However today her triage note it states "I think it was a copperhead". However she still tells me that she did not actually visualize a snake. Nonetheless she has area on her index finger is erythematous dorsum of her hand. This did not show progression within the 6 hour window at [facility] was discharged on Timentin. She continues to take the Augmentin. States it is just simply painful. The area of redness has receded from where she marked yesterday  HPI  Past Medical History:  Diagnosis Date  . Arthritis   . Bullous pemphigus   . Cat bite   . 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 PONV (postoperative nausea and vomiting)   . Situational anxiety   . Staph infection     Patient Active Problem List   Diagnosis Date Noted  . Medical non-compliance 10/05/2015  . Elbow pain 07/20/2015  . Anxiety state 07/20/2015  . Wound infection (HCC) 07/04/2015  . Dehydration 07/04/2015  . Tachycardia 07/04/2015  . Diarrhea 07/04/2015  . Wound infection after surgery 07/04/2015  . 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HCC)   . Cat bite of forearm 07/01/2015  . Skin ulcer of upper arm, limited to breakdown of skin (HCC) 07/01/2015  . Cellulitis of upper arm 03/10/2015    Past Surgical History:  Procedure Laterality Date  . 22 surgeries on left elbow    . ABDOMINAL HYSTERECTOMY    . APPENDECTOMY    . APPLICATION OF A-CELL OF EXTREMITY Left 08/05/2015   Procedure: APPLICATION OF A-CELL OF EXTREMITY;   Surgeon: Peggye Form, DO;  Location: [facility];  Service: Plastics;  Laterality: Left;  . DEBRIDEMENT AND CLOSURE WOUND Left 07/01/2015   Procedure: LEFT ELBOW EXCISION OF WOUND WITH PRIMARY CLOSURE 2X5 CM ;  Surgeon: Peggye Form, DO;  Location: [facility];  Service: Plastics;  Laterality: Left;  . I&D EXTREMITY Left 07/08/2015   Procedure: IRRIGATION AND DEBRIDEMENT EXTREMITY, DRAINAGE OF LEFT ARM WOUND, A-CELL PLACEMENT, WOUND VAC PLACEMENT;  Surgeon: Alena Bills Dillingham, DO;  Location: [facility];  Service: Plastics;  Laterality: Left;  . INCISION AND DRAINAGE OF WOUND Left 08/05/2015   Procedure: IRRIGATION AND DEBRIDEMENT LEFT ELBOW WOUND, PLACEMENT OF ACELL;  Surgeon: Peggye Form, DO;  Location: [facility];  Service: Plastics;  Laterality: Left;  . SKIN GRAFT      OB History    No data available       Home Medications    Prior to Admission medications   Medication Sig Start Date End Date Taking? Authorizing Provider  acetaminophen (TYLENOL) 500 MG tablet Take 1,000 mg by mouth every 6 (six) hours as needed for mild pain, moderate pain, fever or headache.    Historical Provider, MD  amoxicillin-clavulanate (AUGMENTIN) 875-125 MG tablet Take 1 tablet by mouth 2 (two) times daily. 04/08/16   Santiago Glad, PA-C  azithromycin (ZITHROMAX) 250 MG tablet Take 1 tablet (250 mg total) by mouth daily. Take first 2 tablets together, then 1 every day until finished. TAKE ONLY IF SYMPTOMS PERSISTING IN 2-3 DAYS, OR IF HAVING WORSENING SYMPTOMS 03/01/16   [name], PA-C  gabapentin (NEURONTIN) 300 MG capsule TAKE 2 CAPSULES BY MOUTH 3 TIMES DAILY Patient not taking: Reported on 03/01/2016 01/18/16   [name], NP  gabapentin (NEURONTIN) 400 MG capsule Take 400 mg by mouth 4 (four) times daily.    Historical Provider, MD  HYDROcodone-acetaminophen (NORCO/VICODIN) 5-325 MG tablet Take 2 tablets by mouth every 4  (four) hours as needed. 04/10/16   Rolland Porter, MD  oxymetazoline (AFRIN NASAL SPRAY) 0.05 % nasal spray Place 1 spray into both nostrils 2 (two) times daily. Patient not taking: Reported on 01/18/2016 11/22/15   Barrett Henle, PA-C  Phenylephrine-DM-GG [facility] FAST-MAX CONGEST COUGH) 2.5-5-100 MG/5ML LIQD Take 10 mLs by mouth 2 (two) times daily as needed (for cold).    Historical Provider, MD  promethazine-dextromethorphan (PROMETHAZINE-DM) 6.25-15 MG/5ML syrup Take 5 mLs by mouth 4 (four) times daily as needed for cough. Patient not taking: Reported on 01/18/2016 11/19/15   Charlestine Night, PA-C    Family History Family History  Problem Relation Age of Onset  . Liver disease Mother   . Dementia Mother   . Cancer Mother   . Cancer Maternal Grandmother     Social History Social History  Substance Use Topics  . Smoking status: Never Smoker  . Smokeless tobacco: Never Used  . Alcohol use No     Allergies   Morphine and related; Tessalon [benzonatate]; Zofran [ondansetron hcl]; and Droperidol   Review of Systems Review of Systems  Constitutional: Negative for appetite change, chills, diaphoresis, fatigue and fever.  HENT: Negative for mouth sores, sore throat and trouble swallowing.   Eyes: Negative for visual disturbance.  Respiratory: Negative for cough, chest tightness, shortness of breath and wheezing.   Cardiovascular: Negative for chest pain.  Gastrointestinal: Negative for abdominal distention, abdominal pain, diarrhea, nausea and vomiting.  Endocrine: Negative for polydipsia, polyphagia and polyuria.  Genitourinary: Negative for dysuria, frequency and hematuria.  Musculoskeletal: Positive for myalgias. Negative for gait problem.  Skin: Positive for color change and wound. Negative for pallor and rash.  Neurological: Negative for dizziness, syncope, light-headedness and headaches.  Hematological: Does not bruise/bleed easily.  Psychiatric/Behavioral: Negative for  behavioral problems and confusion.     Physical Exam Updated Vital Signs BP 98/71 (BP Location: Right Arm)   Pulse 98   Temp 98.8 F (37.1 C) (Oral)   Resp 16   SpO2 99%   Physical Exam  Constitutional: She is oriented to person, place, and time. She appears well-developed and well-nourished. No distress.  HENT:  Head: Normocephalic.  Eyes: Conjunctivae are normal. Pupils are equal, round, and reactive to light. No scleral icterus.  Neck: Normal range of motion. Neck supple. No thyromegaly present.  Cardiovascular: Normal rate and regular rhythm.  Exam reveals no gallop and no friction rub.   No murmur heard. Pulmonary/Chest: Effort normal and breath sounds normal. No respiratory distress. She has no wheezes. She has no rales.  Abdominal: Soft. Bowel sounds are normal. She exhibits no distension. There is no tenderness. There is no rebound.  Musculoskeletal: Normal range of motion.       Hands: Neurological: She is alert and oriented to person, place, and time.  Skin: Skin is warm and dry. No rash noted.  Psychiatric: She  has a normal mood and affect. Her behavior is normal.     ED Treatments / Results  Labs (all labs ordered are listed, but only abnormal results are displayed) Labs Reviewed  CBC WITH DIFFERENTIAL/PLATELET  BASIC METABOLIC PANEL    EKG  EKG Interpretation None       Radiology No results found.  Procedures Procedures (including critical care time)  Medications Ordered in ED Medications - No data to display   Initial Impression / Assessment and Plan / ED Course  I have reviewed the triage vital signs and the nursing notes.  Pertinent labs & imaging results that were available during my care of the patient were reviewed by me and considered in my medical decision making (see chart for details).  Clinical Course   Patient asked to continue her Augmentin until completed. Benadryl 3 times a day, 400-800 Motrin twice a day to 3 times a day.  Limited number of #12 Vicodin for pain. The area of erythema has receded from the area of demarcation from yesterday.  Final Clinical Impressions(s) / ED Diagnoses   Final diagnoses:  Snake bite, undetermined intent, subsequent encounter    New Prescriptions New Prescriptions   HYDROCODONE-ACETAMINOPHEN (NORCO/VICODIN) 5-325 MG TABLET    Take 2 tablets by mouth every 4 (four) hours as needed.     Rolland Porter, MD 04/10/16 1534

## 2016-04-10 NOTE — ED Notes (Signed)
Patient Alert and oriented X4. Stable and ambulatory. Patient verbalized understanding of the discharge instructions.  Patient belongings were taken by the patient.  

## 2016-04-11 ENCOUNTER — Inpatient Hospital Stay (HOSPITAL_COMMUNITY)
Admission: EM | Admit: 2016-04-11 | Discharge: 2016-04-14 | DRG: 603 | Disposition: A | Discharge status: Home / Self Care | Payer: Self-pay | Attending: Internal Medicine | Admitting: Internal Medicine

## 2016-04-11 ENCOUNTER — Encounter (HOSPITAL_COMMUNITY): Payer: Self-pay | Admitting: Emergency Medicine

## 2016-04-11 ENCOUNTER — Emergency Department (HOSPITAL_COMMUNITY): Payer: Self-pay

## 2016-04-11 DIAGNOSIS — T63001D Toxic effect of unspecified snake venom, accidental (unintentional), subsequent encounter: Secondary | ICD-10-CM

## 2016-04-11 DIAGNOSIS — F411 Generalized anxiety disorder: Secondary | ICD-10-CM | POA: Diagnosis present

## 2016-04-11 DIAGNOSIS — F329 Major depressive disorder, single episode, unspecified: Secondary | ICD-10-CM | POA: Diagnosis present

## 2016-04-11 DIAGNOSIS — F419 Anxiety disorder, unspecified: Secondary | ICD-10-CM | POA: Diagnosis present

## 2016-04-11 DIAGNOSIS — G609 Hereditary and idiopathic neuropathy, unspecified: Secondary | ICD-10-CM

## 2016-04-11 DIAGNOSIS — L0390 Cellulitis, unspecified: Secondary | ICD-10-CM | POA: Diagnosis present

## 2016-04-11 DIAGNOSIS — Z809 Family history of malignant neoplasm, unspecified: Secondary | ICD-10-CM

## 2016-04-11 DIAGNOSIS — T63061A Toxic effect of venom of other North and South American snake, accidental (unintentional), initial encounter: Secondary | ICD-10-CM | POA: Diagnosis present

## 2016-04-11 DIAGNOSIS — W5911XA Bitten by nonvenomous snake, initial encounter: Secondary | ICD-10-CM | POA: Insufficient documentation

## 2016-04-11 DIAGNOSIS — L03113 Cellulitis of right upper limb: Principal | ICD-10-CM | POA: Diagnosis present

## 2016-04-11 HISTORY — DX: Toxic effect of unspecified snake venom, accidental (unintentional), initial encounter: T63.001A

## 2016-04-11 HISTORY — DX: Personal history of other medical treatment: Z92.89

## 2016-04-11 LAB — I-STAT CG4 LACTIC ACID, ED
LACTIC ACID, VENOUS: 0.81 mmol/L (ref 0.5–1.9)
Lactic Acid, Venous: 1.6 mmol/L (ref 0.5–1.9)

## 2016-04-11 LAB — COMPREHENSIVE METABOLIC PANEL
ALT: 17 U/L (ref 14–54)
AST: 22 U/L (ref 15–41)
Albumin: 4.2 g/dL (ref 3.5–5.0)
Alkaline Phosphatase: 95 U/L (ref 38–126)
Anion gap: 8 (ref 5–15)
BILIRUBIN TOTAL: 0.5 mg/dL (ref 0.3–1.2)
BUN: 21 mg/dL — AB (ref 6–20)
CHLORIDE: 107 mmol/L (ref 101–111)
CO2: 24 mmol/L (ref 22–32)
CREATININE: 0.85 mg/dL (ref 0.44–1.00)
Calcium: 9.3 mg/dL (ref 8.9–10.3)
GFR calc Af Amer: >60 mL/min (ref >60)
Glucose, Bld: 88 mg/dL (ref 65–99)
Potassium: 3.7 mmol/L (ref 3.5–5.1)
Sodium: 139 mmol/L (ref 135–145)
Total Protein: 6.9 g/dL (ref 6.5–8.1)

## 2016-04-11 LAB — CBC WITH DIFFERENTIAL/PLATELET
BASOS ABS: 0 10*3/uL (ref 0.0–0.1)
Basophils Relative: 0 %
EOS PCT: 2 %
Eosinophils Absolute: 0.1 10*3/uL (ref 0.0–0.7)
HCT: 38.4 % (ref 36.0–46.0)
Hemoglobin: 12.3 g/dL (ref 12.0–15.0)
LYMPHS PCT: 50 %
Lymphs Abs: 2.3 10*3/uL (ref 0.7–4.0)
MCH: 30.4 pg (ref 26.0–34.0)
MCHC: 32 g/dL (ref 30.0–36.0)
MCV: 95 fL (ref 78.0–100.0)
MONO ABS: 0.4 10*3/uL (ref 0.1–1.0)
MONOS PCT: 9 %
Neutro Abs: 1.8 10*3/uL (ref 1.7–7.7)
Neutrophils Relative %: 39 %
PLATELETS: 270 10*3/uL (ref 150–400)
RBC: 4.04 MIL/uL (ref 3.87–5.11)
RDW: 13.8 % (ref 11.5–15.5)
WBC: 4.6 10*3/uL (ref 4.0–10.5)

## 2016-04-11 LAB — URINALYSIS, ROUTINE W REFLEX MICROSCOPIC
Bilirubin Urine: NEGATIVE
Glucose, UA: NEGATIVE mg/dL
Hgb urine dipstick: NEGATIVE
KETONES UR: NEGATIVE mg/dL
LEUKOCYTES UA: NEGATIVE
NITRITE: NEGATIVE
PROTEIN: NEGATIVE mg/dL
Specific Gravity, Urine: 1.026 (ref 1.005–1.030)
pH: 5.5 (ref 5.0–8.0)

## 2016-04-11 LAB — PROTIME-INR
INR: 1.05
PROTHROMBIN TIME: 13.7 s (ref 11.4–15.2)

## 2016-04-11 LAB — C-REACTIVE PROTEIN

## 2016-04-11 LAB — SEDIMENTATION RATE
Sed Rate: 10 mm/hr (ref 0–22)
Sed Rate: 5 mm/hr (ref 0–22)

## 2016-04-11 MED ORDER — HYDROMORPHONE HCL 1 MG/ML IJ SOLN
1.0000 mg | Freq: Once | INTRAMUSCULAR | Status: AC
  Administered 2016-04-11: 1 mg via INTRAVENOUS
  Filled 2016-04-11: qty 1

## 2016-04-11 MED ORDER — HYDROCODONE-ACETAMINOPHEN 5-325 MG PO TABS
2.0000 | ORAL_TABLET | ORAL | Status: DC | PRN
  Administered 2016-04-12: 2 via ORAL
  Filled 2016-04-11: qty 2

## 2016-04-11 MED ORDER — VANCOMYCIN HCL 10 G IV SOLR: 2000.0000 mg | Freq: Once | INTRAVENOUS | Status: DC

## 2016-04-11 MED ORDER — VANCOMYCIN HCL IN DEXTROSE 1-5 GM/200ML-% IV SOLN: 1000.0000 mg | Freq: Once | INTRAVENOUS | Status: DC

## 2016-04-11 MED ORDER — PIPERACILLIN-TAZOBACTAM 3.375 G IVPB
3.3750 g | Freq: Three times a day (TID) | INTRAVENOUS | Status: DC
  Administered 2016-04-11 – 2016-04-14 (×8): 3.375 g via INTRAVENOUS
  Filled 2016-04-11 (×10): qty 50

## 2016-04-11 MED ORDER — FAMOTIDINE 20 MG PO TABS
20.0000 mg | ORAL_TABLET | Freq: Two times a day (BID) | ORAL | Status: DC
  Administered 2016-04-11 – 2016-04-14 (×6): 20 mg via ORAL
  Filled 2016-04-11 (×6): qty 1

## 2016-04-11 MED ORDER — MORPHINE SULFATE (PF) 2 MG/ML IV SOLN: 2.0000 mg | INTRAVENOUS | Status: DC | PRN

## 2016-04-11 MED ORDER — SODIUM CHLORIDE 0.9 % IV SOLN
1250.0000 mg | Freq: Once | INTRAVENOUS | Status: AC
  Administered 2016-04-11: 1250 mg via INTRAVENOUS
  Filled 2016-04-11: qty 1250

## 2016-04-11 MED ORDER — ACETAMINOPHEN 325 MG PO TABS: 650.0000 mg | ORAL_TABLET | Freq: Four times a day (QID) | ORAL | Status: DC | PRN

## 2016-04-11 MED ORDER — METOCLOPRAMIDE HCL 5 MG/ML IJ SOLN
10.0000 mg | Freq: Once | INTRAMUSCULAR | Status: AC
  Administered 2016-04-11: 10 mg via INTRAVENOUS
  Filled 2016-04-11: qty 2

## 2016-04-11 MED ORDER — SODIUM CHLORIDE 0.9 % IV BOLUS (SEPSIS)
1000.0000 mL | Freq: Once | INTRAVENOUS | Status: AC
Start: 2016-04-11 — End: 2016-04-11
  Administered 2016-04-11: 1000 mL via INTRAVENOUS

## 2016-04-11 MED ORDER — GABAPENTIN 600 MG PO TABS
600.0000 mg | ORAL_TABLET | Freq: Three times a day (TID) | ORAL | Status: DC
  Administered 2016-04-11 – 2016-04-14 (×8): 600 mg via ORAL
  Filled 2016-04-11 (×8): qty 1

## 2016-04-11 MED ORDER — SODIUM CHLORIDE 0.9% FLUSH
3.0000 mL | Freq: Two times a day (BID) | INTRAVENOUS | Status: DC
  Administered 2016-04-12: 3 mL via INTRAVENOUS

## 2016-04-11 MED ORDER — DIPHENHYDRAMINE HCL 50 MG/ML IJ SOLN
25.0000 mg | Freq: Three times a day (TID) | INTRAMUSCULAR | Status: DC | PRN
  Administered 2016-04-11 – 2016-04-14 (×6): 25 mg via INTRAVENOUS
  Filled 2016-04-11 (×6): qty 1

## 2016-04-11 MED ORDER — VANCOMYCIN HCL IN DEXTROSE 1-5 GM/200ML-% IV SOLN
1000.0000 mg | Freq: Two times a day (BID) | INTRAVENOUS | Status: DC
  Administered 2016-04-12 – 2016-04-13 (×3): 1000 mg via INTRAVENOUS
  Filled 2016-04-11 (×4): qty 200

## 2016-04-11 MED ORDER — SODIUM CHLORIDE 0.9 % IV BOLUS (SEPSIS)
1500.0000 mL | Freq: Once | INTRAVENOUS | Status: AC
  Administered 2016-04-11: 1500 mL via INTRAVENOUS

## 2016-04-11 MED ORDER — SODIUM CHLORIDE 0.9 % IV SOLN
INTRAVENOUS | Status: DC
  Administered 2016-04-11 – 2016-04-12 (×2): via INTRAVENOUS

## 2016-04-11 MED ORDER — ACETAMINOPHEN 650 MG RE SUPP: 650.0000 mg | Freq: Four times a day (QID) | RECTAL | Status: DC | PRN

## 2016-04-11 MED ORDER — HYDROMORPHONE HCL 1 MG/ML IJ SOLN
1.0000 mg | INTRAMUSCULAR | Status: DC | PRN
  Administered 2016-04-11 – 2016-04-14 (×14): 1 mg via INTRAVENOUS
  Filled 2016-04-11 (×14): qty 1

## 2016-04-11 MED ORDER — ENOXAPARIN SODIUM 40 MG/0.4ML ~~LOC~~ SOLN: 40.0000 mg | SUBCUTANEOUS | Status: DC

## 2016-04-11 MED ORDER — CLONAZEPAM 0.5 MG PO TABS
0.5000 mg | ORAL_TABLET | Freq: Two times a day (BID) | ORAL | Status: DC | PRN
  Administered 2016-04-13: 0.5 mg via ORAL
  Filled 2016-04-11: qty 1

## 2016-04-11 NOTE — ED Triage Notes (Signed)
Pt here on Saturday for snake bite, here for recheck, but having increased symptoms-- tightness in throat, right index finger is numb/hand is numb, red line from snake bite up arm-- outlined-- dated -- pt states "I just don't feel right"

## 2016-04-11 NOTE — ED Notes (Signed)
Report attempted x 2. Placed on hold for long time with not respond.

## 2016-04-11 NOTE — ED Provider Notes (Signed)
[facility] Provider Note   CSN: [id number] Arrival date & time: 04/11/16  1635  First Provider Contact:  None       History   Chief Complaint Chief Complaint  Patient presents with  . Snake Bite    HPI Sabrina [name] is a 50 y.o. female.  HPI  Patient with history of recent snake bite to the right index finger presents for reevaluation of the area. This happened approximately 3 days ago on the right index finger. She reports increasing pain today, increasing redness of her arm. Pain is achy in nature, 10 out of 10, associated with nausea which she attributes to the pain. She also endorses coughing twice today having a little bit of blood in her cough. She denies any shortness of breath or chest pain. Otherwise she endorses generalized myalgias. Denies fevers at home.  Past Medical History:  Diagnosis Date  . Arthritis   . Bullous pemphigus   . Cat bite   . 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 PONV (postoperative nausea and vomiting)   . Situational anxiety   . Staph infection     Patient Active Problem List   Diagnosis Date Noted  . Cellulitis 04/11/2016  . Medical non-compliance 10/05/2015  . Elbow pain 07/20/2015  . Anxiety state 07/20/2015  . Wound infection (HCC) 07/04/2015  . Dehydration 07/04/2015  . Tachycardia 07/04/2015  . Diarrhea 07/04/2015  . Wound infection after surgery 07/04/2015  . 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HCC)   . Cat bite of forearm 07/01/2015  . Skin ulcer of upper arm, limited to breakdown of skin (HCC) 07/01/2015  . Cellulitis of upper arm 03/10/2015    Past Surgical History:  Procedure Laterality Date  . 22 surgeries on left elbow    . ABDOMINAL HYSTERECTOMY    . APPENDECTOMY    . APPLICATION OF A-CELL OF EXTREMITY Left 08/05/2015   Procedure: APPLICATION OF A-CELL OF EXTREMITY;  Surgeon: Peggye Form, DO;  Location: [facility];  Service: Plastics;  Laterality: Left;  . DEBRIDEMENT AND CLOSURE WOUND Left 07/01/2015    Procedure: LEFT ELBOW EXCISION OF WOUND WITH PRIMARY CLOSURE 2X5 CM ;  Surgeon: Peggye Form, DO;  Location: [facility];  Service: Plastics;  Laterality: Left;  . I&D EXTREMITY Left 07/08/2015   Procedure: IRRIGATION AND DEBRIDEMENT EXTREMITY, DRAINAGE OF LEFT ARM WOUND, A-CELL PLACEMENT, WOUND VAC PLACEMENT;  Surgeon: Alena Bills Dillingham, DO;  Location: [facility];  Service: Plastics;  Laterality: Left;  . INCISION AND DRAINAGE OF WOUND Left 08/05/2015   Procedure: IRRIGATION AND DEBRIDEMENT LEFT ELBOW WOUND, PLACEMENT OF ACELL;  Surgeon: Peggye Form, DO;  Location: [facility];  Service: Plastics;  Laterality: Left;  . SKIN GRAFT      OB History    No data available       Home Medications    Prior to Admission medications   Medication Sig Start Date End Date Taking? Authorizing Provider  amoxicillin-clavulanate (AUGMENTIN) 875-125 MG tablet Take 1 tablet by mouth 2 (two) times daily. Patient taking differently: Take 1 tablet by mouth 2 (two) times daily. For 5 days 04/08/16  Yes Heather Laisure, PA-C  Aspirin-Acetaminophen-Caffeine (GOODY HEADACHE PO) Take 1 packet by mouth daily as needed (for headache).   Yes Historical Provider, MD  clonazePAM (KLONOPIN) 0.5 MG tablet Take 0.5 mg by mouth 2 (two) times daily as needed for anxiety.  01/05/16  Yes Historical Provider, MD  gabapentin (NEURONTIN) 300 MG capsule  TAKE 2 CAPSULES BY MOUTH 3 TIMES DAILY Patient taking differently: Take 600 mg by mouth 3 (three) times daily.  01/18/16  Yes [name], NP  HYDROcodone-acetaminophen (NORCO/VICODIN) 5-325 MG tablet Take 2 tablets by mouth every 4 (four) hours as needed. Patient taking differently: Take 2 tablets by mouth every 4 (four) hours as needed for moderate pain.  04/10/16  Yes Rolland Porter, MD    Family History Family History  Problem Relation Age of Onset  . Liver disease Mother   . Dementia Mother   . Cancer Mother   . Cancer Maternal  Grandmother     Social History Social History  Substance Use Topics  . Smoking status: Never Smoker  . Smokeless tobacco: Never Used  . Alcohol use No     Allergies   Zofran [ondansetron hcl]; Morphine and related; Tessalon [benzonatate]; and Droperidol   Review of Systems Review of Systems  Constitutional: Negative for chills and fever.  HENT: Negative for ear pain and sore throat.   Eyes: Negative for pain and visual disturbance.  Respiratory: Negative for cough and shortness of breath.   Cardiovascular: Negative for chest pain and palpitations.  Gastrointestinal: Negative for abdominal pain and vomiting.  Genitourinary: Negative for dysuria and hematuria.  Musculoskeletal: Positive for myalgias. Negative for arthralgias and back pain.  Skin: Negative for color change and rash.  Neurological: Negative for seizures and syncope.  All other systems reviewed and are negative.    Physical Exam Updated Vital Signs BP 94/66   Pulse 72   Temp 98.5 F (36.9 C) (Oral)   Resp 16   Ht 5\' 4"  (1.626 m)   Wt 83.9 kg   SpO2 100%   BMI 31.76 kg/m   Physical Exam  Constitutional: She appears well-developed and well-nourished. No distress.  HENT:  Head: Normocephalic and atraumatic.  Eyes: Conjunctivae are normal.  Neck: Neck supple.  Cardiovascular: Normal rate and regular rhythm.   No murmur heard. Pulmonary/Chest: Effort normal and breath sounds normal. No respiratory distress.  Abdominal: Soft. There is no tenderness.  Musculoskeletal: She exhibits no edema.  Neurological: She is alert.  Skin: Skin is warm and dry.  Right index finger with a 1x2cm area of skin desquamation.  No edema.  Erythema of right hand, forearm, and distal arm.  Intact movement of fingers.  Decreased sensation of R index finger, intact cap refill.   Psychiatric: She has a normal mood and affect.  Nursing note and vitals reviewed.    ED Treatments / Results  Labs (all labs ordered are listed,  but only abnormal results are displayed) Labs Reviewed  COMPREHENSIVE METABOLIC PANEL - Abnormal; Notable for the following:       Result Value   BUN 21 (*)    All other components within normal limits  CULTURE, BLOOD (ROUTINE X 2)  CULTURE, BLOOD (ROUTINE X 2)  URINE CULTURE  CBC WITH DIFFERENTIAL/PLATELET  URINALYSIS, ROUTINE W REFLEX MICROSCOPIC (NOT AT [facility])  SEDIMENTATION RATE  C-REACTIVE PROTEIN  PROTIME-INR  I-STAT CG4 LACTIC ACID, ED  I-STAT CG4 LACTIC ACID, ED    EKG  EKG Interpretation None       Radiology Dg Chest 2 View  Result Date: 04/11/2016 CLINICAL DATA:  Pt states she has been coughing up blood this afternoon after being bit by a copperhead on Friday. Pt also c/o nausea. No hx of heart or lung problems. Pt is a nonsmoker. EXAM: CHEST  2 VIEW COMPARISON:  None. FINDINGS: The heart  size and mediastinal contours are within normal limits. Both lungs are clear. The visualized skeletal structures are unremarkable. IMPRESSION: No active cardiopulmonary disease. Electronically Signed   By: Norva Pavlov M.D.   On: 04/11/2016 17:57    Procedures Procedures (including critical care time)  Medications Ordered in ED Medications  vancomycin (VANCOCIN) 1,250 mg in sodium chloride 0.9 % 250 mL IVPB (1,250 mg Intravenous New Bag/Given 04/11/16 2012)  metoCLOPramide (REGLAN) injection 10 mg (10 mg Intravenous Given 04/11/16 2006)  sodium chloride 0.9 % bolus 1,000 mL (1,000 mLs Intravenous New Bag/Given 04/11/16 2005)  HYDROmorphone (DILAUDID) injection 1 mg (1 mg Intravenous Given 04/11/16 2008)     Initial Impression / Assessment and Plan / ED Course  I have reviewed the triage vital signs and the nursing notes.  Pertinent labs & imaging results that were available during my care of the patient were reviewed by me and considered in my medical decision making (see chart for details).  Clinical Course    Discussed this case with poison center who is mostly with this  patient. Dr. Kathryne Eriksson and Dr. Claris Gower from poison center reviewed pictures of this patient's arm from today and felt it did not warrant crofab administration, but more likely represented cellulitis.  I agreed this appears to be worsening cellulitis, and thus failure of outpatient treatment. On exam there are no signs of compartment syndrome.  I started IV vancomycin here for treatment of his eyelids. She has no signs of systemic toxicity. Patient will be admitted to hospitalist for further treatment.  Medications report of cough with hemoptysis, this is not extensive hemoptysis byhistory. Chest x-ray did not show signs of alveolar hemorrhage, and patient's blood count was normal. Patient doesn't appear to have any signs of systemic toxicity from envenomation.  Final Clinical Impressions(s) / ED Diagnoses   Final diagnoses:  Snake bite, accidental or unintentional, subsequent encounter  Cellulitis of right upper extremity    New Prescriptions New Prescriptions   No medications on file     Marcelina Morel, MD 04/11/16 2020    Gerhard Munch, MD 04/12/16 [phone]

## 2016-04-11 NOTE — Progress Notes (Signed)
Pharmacy Antibiotic Note  Sabrina [name] is a 50 y.o. female admitted on 04/11/2016 with cellulitis.  Pharmacy has been consulted for vancomycin and Zosyn dosing. Patient had recent snake bite of right index finger 3 days prior with increasing pain and redness of arm more consistent with cellulitis. No signs of compartment syndrome per notes.   Patient received vancomycin 1250 mg IV at 2012 PM.  Plan: Continue vancomycin with 1000 mg IV every 12 hours.  Add Zosyn 3.375g IV every 8 hours - 4 hour infusion.  Consider de-escalation if non-pululent, mild disease (wbc < 12k, afebrile, no tachycardia/hypotension).   Height: 5\' 4"  (162.6 cm) Weight: 185 lb (83.9 kg) IBW/kg (Calculated) : 54.7  Temp (24hrs), Avg:98.5 F (36.9 C), Min:98.5 F (36.9 C), Max:98.5 F (36.9 C)   Recent Labs Lab 04/10/16 1241 04/11/16 1728 04/11/16 1740  WBC 4.0 4.6  --   CREATININE 0.81 0.85  --   LATICACIDVEN  --   --  0.81    Estimated Creatinine Clearance: 85.8 mL/min (by C-G formula based on SCr of 0.85 mg/dL).    Allergies  Allergen Reactions  . Zofran [Ondansetron Hcl] Hives    Spots around iv site  . Morphine And Related Itching  . Tessalon [Benzonatate] Other (See Comments)    Watery eyes  . Droperidol Palpitations    Antimicrobials this admission: Vancomycin 7/31 >> Zosyn 7/31 >>  Dose adjustments this admission: n/a  Microbiology results: 7/31 Blood Cx x2 >> 7/31 Urine >>  Thank you for allowing pharmacy to be a part of this patient's care.  Link Snuffer, PharmD, BCPS Clinical Pharmacist [phone] 04/11/2016 8:29 PM

## 2016-04-11 NOTE — ED Notes (Signed)
MD at bedside. 

## 2016-04-11 NOTE — ED Notes (Signed)
Report attempted, RN unable to get report at this time. 

## 2016-04-11 NOTE — H&P (Signed)
History and Physical    Sabrina [name] ZCH:[id number] DOB: 02/28/1966 DOA: 04/11/2016  Referring MD/NP/PA:   PCP: Sharon Seller, NP   Patient coming from:  The patient is coming from home.  At baseline, pt is independent for most of ADL.   Chief Complaint: R upper extremity pain and swelling.   HPI: Sabrina [name] is a 50 y.o. female with medical history significant of bullous pemphigus, arthritis, osteomyelitis, who presents with R upper extremity pain and swelling.   Pt states that she had possible snake bite to the right index finger on 04/08/16. She was seen in ED and d/c'ed on Augmentin. Patient has been taking Augmentin, but reports that she has increasing pain, swelling, redness of her right hand and arm. The pain is constant, 7 out of 10 in severity. It is not aggravated or alleviated by any known factors. She has nausea, but no vomiting, diarrhea or abdominal pain. She reports that she coughs up tiny amount of blood streaks twice today. Currently no coughing, chest pain or shortness of breath. Does not have difficulty swallowing. I repeatedly confirmed with patient that she does not have difficult breathing or wheezing. Patient feels hot, but no fever or chills. Otherwise she endorses generalized myalgias and diffused joint pain, but no joint swelling or redness.   ED Course: pt was found to have WBC 4.6, negative urinalysis, ligated 0.81, temperature normal, no tachycardia, no tachypnea, electrolytes renal function okay, negative chest x-ray for acute abnormalities. Patient blood pressure is soft 94/66, which is normal to patient. She states that her blood pressure has been running low always. Pt is placed on tele bed for obs.  Review of Systems:   General: no fevers, chills, no changes in body weight, has poor appetite, has fatigue HEENT: no blurry vision, hearing changes or sore throat Pulm: no dyspnea, coughing, wheezing. Had hemoptysis.  CV: no chest pain, no palpitations Abd:  has nausea, no vomiting, abdominal pain, diarrhea, constipation GU: no dysuria, burning on urination, increased urinary frequency, hematuria  Ext: no leg edema Neuro: no unilateral weakness, numbness, or tingling, no vision change or hearing loss Skin: no rash. Has swelling, redness and warmth in R hand and arm. MSK: No muscle spasm, no deformity, no limitation of range of movement in spin Heme: No easy bruising.  Travel history: No recent long distant travel.  Allergy:  Allergies  Allergen Reactions  . Zofran [Ondansetron Hcl] Hives    Spots around iv site  . Morphine And Related Itching  . Tessalon [Benzonatate] Other (See Comments)    Watery eyes  . Droperidol Palpitations    Past Medical History:  Diagnosis Date  . Arthritis   . Bullous pemphigus   . Cat bite   . 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[place])   . PONV (postoperative nausea and vomiting)   . Situational anxiety   . Staph infection     Past Surgical History:  Procedure Laterality Date  . 22 surgeries on left elbow    . ABDOMINAL HYSTERECTOMY    . APPENDECTOMY    . APPLICATION OF A-CELL OF EXTREMITY Left 08/05/2015   Procedure: APPLICATION OF A-CELL OF EXTREMITY;  Surgeon: Wallace Going, DO;  Location: [place];  Service: Plastics;  Laterality: Left;  . DEBRIDEMENT AND CLOSURE WOUND Left 07/01/2015   Procedure: LEFT ELBOW EXCISION OF WOUND WITH PRIMARY CLOSURE 2X5 CM ;  Surgeon: Wallace Going, DO;  Location: [place];  Service: Plastics;  Laterality: Left;  .  I&D EXTREMITY Left 07/08/2015   Procedure: IRRIGATION AND DEBRIDEMENT EXTREMITY, DRAINAGE OF LEFT ARM WOUND, A-CELL PLACEMENT, WOUND VAC PLACEMENT;  Surgeon: Loel Lofty Dillingham, DO;  Location: [facility];  Service: Plastics;  Laterality: Left;  . INCISION AND DRAINAGE OF WOUND Left 08/05/2015   Procedure: IRRIGATION AND DEBRIDEMENT LEFT ELBOW WOUND, PLACEMENT OF ACELL;  Surgeon: Wallace Going, DO;  Location: [place];  Service: Plastics;  Laterality: Left;  . SKIN GRAFT      Social History:  reports that she has never smoked. She has never used smokeless tobacco. She reports that she does not drink alcohol or use drugs.  Family History:  Family History  Problem Relation Age of Onset  . Liver disease Mother   . Dementia Mother   . Cancer Mother   . Cancer Maternal Grandmother      Prior to Admission medications   Medication Sig Start Date End Date Taking? Authorizing Provider  amoxicillin-clavulanate (AUGMENTIN) 875-125 MG tablet Take 1 tablet by mouth 2 (two) times daily. Patient taking differently: Take 1 tablet by mouth 2 (two) times daily. For 5 days 04/08/16  Yes Heather Laisure, PA-C  Aspirin-Acetaminophen-Caffeine (GOODY HEADACHE PO) Take 1 packet by mouth daily as needed (for headache).   Yes Historical Provider, MD  clonazePAM (KLONOPIN) 0.5 MG tablet Take 0.5 mg by mouth 2 (two) times daily as needed for anxiety.  01/05/16  Yes Historical Provider, MD  gabapentin (NEURONTIN) 300 MG capsule TAKE 2 CAPSULES BY MOUTH 3 TIMES DAILY Patient taking differently: Take 600 mg by mouth 3 (three) times daily.  01/18/16  Yes Micheline Chapman, NP  HYDROcodone-acetaminophen (NORCO/VICODIN) 5-325 MG tablet Take 2 tablets by mouth every 4 (four) hours as needed. Patient taking differently: Take 2 tablets by mouth every 4 (four) hours as needed for moderate pain.  04/10/16  Yes Tanna Furry, MD    Physical Exam: Vitals:   04/11/16 1930 04/11/16 1945 04/11/16 2015 04/11/16 2030  BP: '98/82 94/66 93/60 '$ 91/59  Pulse: 78 72 77 69  Resp:      Temp:      TempSrc:      SpO2: 100% 100% 96% 95%  Weight:      Height:       General: Not in acute distress HEENT:       Eyes: PERRL, EOMI, no scleral icterus.       ENT: No discharge from the ears and nose, no pharynx injection, no tonsillar enlargement.        Neck: No JVD, no bruit, no mass felt. Heme: No neck lymph node enlargement. Cardiac:  S1/S2, RRR, No murmurs, No gallops or rubs. Pulm: No rales, wheezing, rhonchi or rubs. Abd: Soft, nondistended, nontender, no rebound pain, no organomegaly, BS present. GU: No hematuria Ext: No pitting leg edema bilaterally. 2+DP/PT pulse bilaterally. Musculoskeletal: No joint deformities, No joint redness or warmth, no limitation of ROM in spin. Skin:  Has swelling, redness and warmth in R hand and arm, extending hand to upper arm. There is a small skin tear, approximately 11.5 cm in size, in right index finger. Intact cap refill.  Neuro: Alert, oriented X3, cranial nerves II-XII grossly intact, moves all extremities normally.  Psych: Patient is not psychotic, no suicidal or hemocidal ideation.  Labs on Admission: I have personally reviewed following labs and imaging studies  CBC:  Recent Labs Lab 04/10/16 1241 04/11/16 1728  WBC 4.0 4.6  NEUTROABS 1.9 1.8  HGB 12.9 12.3  HCT  40.5 38.4  MCV 94.4 95.0  PLT 257 161   Basic Metabolic Panel:  Recent Labs Lab 04/10/16 1241 04/11/16 1728  NA 140 139  K 3.7 3.7  CL 108 107  CO2 24 24  GLUCOSE 87 88  BUN 18 21*  CREATININE 0.81 0.85  CALCIUM 9.2 9.3   GFR: Estimated Creatinine Clearance: 85.8 mL/min (by C-G formula based on SCr of 0.85 mg/dL). Liver Function Tests:  Recent Labs Lab 04/11/16 1728  AST 22  ALT 17  ALKPHOS 95  BILITOT 0.5  PROT 6.9  ALBUMIN 4.2   No results for input(s): LIPASE, AMYLASE in the last 168 hours. No results for input(s): AMMONIA in the last 168 hours. Coagulation Profile:  Recent Labs Lab 04/11/16 2007  INR 1.05   Cardiac Enzymes: No results for input(s): CKTOTAL, CKMB, CKMBINDEX, TROPONINI in the last 168 hours. BNP (last 3 results) No results for input(s): PROBNP in the last 8760 hours. HbA1C: No results for input(s): HGBA1C in the last 72 hours. CBG: No results for input(s): GLUCAP in the last 168 hours. Lipid Profile: No results for input(s): CHOL, HDL, LDLCALC, TRIG,  CHOLHDL, LDLDIRECT in the last 72 hours. Thyroid Function Tests: No results for input(s): TSH, T4TOTAL, FREET4, T3FREE, THYROIDAB in the last 72 hours. Anemia Panel: No results for input(s): VITAMINB12, FOLATE, FERRITIN, TIBC, IRON, RETICCTPCT in the last 72 hours. Urine analysis:    Component Value Date/Time   COLORURINE YELLOW 04/11/2016 1933   APPEARANCEUR CLEAR 04/11/2016 1933   LABSPEC 1.026 04/11/2016 1933   PHURINE 5.5 04/11/2016 1933   GLUCOSEU NEGATIVE 04/11/2016 1933   HGBUR NEGATIVE 04/11/2016 1933   BILIRUBINUR NEGATIVE 04/11/2016 1933   KETONESUR NEGATIVE 04/11/2016 1933   PROTEINUR NEGATIVE 04/11/2016 1933   UROBILINOGEN 0.2 07/05/2015 0627   NITRITE NEGATIVE 04/11/2016 1933   LEUKOCYTESUR NEGATIVE 04/11/2016 1933   Sepsis Labs: '@LABRCNTIP'$ (procalcitonin:4,lacticidven:4) )No results found for this or any previous visit (from the past 240 hour(s)).   Radiological Exams on Admission: Dg Chest 2 View  Result Date: 04/11/2016 CLINICAL DATA:  Pt states she has been coughing up blood this afternoon after being bit by a copperhead on Friday. Pt also c/o nausea. No hx of heart or lung problems. Pt is a nonsmoker. EXAM: CHEST  2 VIEW COMPARISON:  None. FINDINGS: The heart size and mediastinal contours are within normal limits. Both lungs are clear. The visualized skeletal structures are unremarkable. IMPRESSION: No active cardiopulmonary disease. Electronically Signed   By: Nolon Nations M.D.   On: 04/11/2016 17:57     EKG:  Not done in ED, will get one.   Assessment/Plan Principal Problem:   Cellulitis of right upper extremity Active Problems:   Anxiety state   Cellulitis   Cellulitis of right upper extremity: EDP discussed this case with poison center. Per EDP, "Dr. Donnella Sham and Dr. Otho Darner from [facility] reviewed pictures of this patient's arm from today and felt it did not warrant crofab administration, but more likely represented cellulitis". Patient's  redness, warmth, swelling and tenderness in right upper extremities are consistent with cellulitis. Patient is not septic. Lactate is normal. Hemodynamically stable. Pt failed outpt oral Abx treatment. Her joint pain is likely due to allergic reaction.  - will place on tele bed for obs - Empiric antimicrobial treatment with vancomycin and Zosyn per pharmacy - PRN Percocet for pain - Blood cultures x 2  - ESR and CRP - wound care consult - IVF: 2.5 L of NS bolus in ED,  followed by 100 cc/h - prn Benadryl for allergy - pepcid   Depression: Stable, no suicidal or homicidal ideations. -Continue home medications: Klonopin   DVT ppx: SCD Code Status: Full code Family Communication: None at bed side.   Disposition Plan:  Anticipate discharge back to previous home environment Consults called:  none Admission status: Obs / tele  Date of Service 04/11/2016    Ivor Costa [facility] Pager [phone]  If 7PM-7AM, please contact night-coverage [email or web address] Password [facility] 04/11/2016,

## 2016-04-12 LAB — CBC
HEMATOCRIT: 30.7 % — AB (ref 36.0–46.0)
Hemoglobin: 9.6 g/dL — ABNORMAL LOW (ref 12.0–15.0)
MCH: 30.3 pg (ref 26.0–34.0)
MCHC: 31.3 g/dL (ref 30.0–36.0)
MCV: 96.8 fL (ref 78.0–100.0)
Platelets: 208 10*3/uL (ref 150–400)
RBC: 3.17 MIL/uL — AB (ref 3.87–5.11)
RDW: 13.9 % (ref 11.5–15.5)
WBC: 5.1 10*3/uL (ref 4.0–10.5)

## 2016-04-12 LAB — FOLATE: FOLATE: 13.4 ng/mL (ref >5.9)

## 2016-04-12 LAB — URINE MICROSCOPIC-ADD ON

## 2016-04-12 LAB — BASIC METABOLIC PANEL
Anion gap: 3 — ABNORMAL LOW (ref 5–15)
BUN: 17 mg/dL (ref 6–20)
CHLORIDE: 110 mmol/L (ref 101–111)
CO2: 26 mmol/L (ref 22–32)
Calcium: 8.2 mg/dL — ABNORMAL LOW (ref 8.9–10.3)
Creatinine, Ser: 0.94 mg/dL (ref 0.44–1.00)
GFR calc non Af Amer: >60 mL/min (ref >60)
Glucose, Bld: 87 mg/dL (ref 65–99)
POTASSIUM: 3.6 mmol/L (ref 3.5–5.1)
SODIUM: 139 mmol/L (ref 135–145)

## 2016-04-12 LAB — URINALYSIS, ROUTINE W REFLEX MICROSCOPIC
Bilirubin Urine: NEGATIVE
GLUCOSE, UA: NEGATIVE mg/dL
Hgb urine dipstick: NEGATIVE
Ketones, ur: NEGATIVE mg/dL
Nitrite: NEGATIVE
PH: 5.5 (ref 5.0–8.0)
Protein, ur: NEGATIVE mg/dL
SPECIFIC GRAVITY, URINE: 1.019 (ref 1.005–1.030)

## 2016-04-12 LAB — IRON AND TIBC
Iron: 193 ug/dL — ABNORMAL HIGH (ref 28–170)
Saturation Ratios: 59 % — ABNORMAL HIGH (ref 10.4–31.8)
TIBC: 326 ug/dL (ref 250–450)
UIBC: 133 ug/dL

## 2016-04-12 LAB — RETICULOCYTES
RBC.: 3.7 MIL/uL — AB (ref 3.87–5.11)
RETIC COUNT ABSOLUTE: 40.7 10*3/uL (ref 19.0–186.0)
Retic Ct Pct: 1.1 % (ref 0.4–3.1)

## 2016-04-12 LAB — VITAMIN B12: VITAMIN B 12: 630 pg/mL (ref 180–914)

## 2016-04-12 LAB — FERRITIN: Ferritin: 25 ng/mL (ref 11–307)

## 2016-04-12 LAB — HIV ANTIBODY (ROUTINE TESTING W REFLEX): HIV SCREEN 4TH GENERATION: NONREACTIVE

## 2016-04-12 MED ORDER — DOCUSATE SODIUM 100 MG PO CAPS
100.0000 mg | ORAL_CAPSULE | Freq: Two times a day (BID) | ORAL | Status: DC
  Administered 2016-04-12 – 2016-04-14 (×3): 100 mg via ORAL
  Filled 2016-04-12 (×5): qty 1

## 2016-04-12 MED ORDER — OXYCODONE HCL 5 MG PO TABS
5.0000 mg | ORAL_TABLET | ORAL | Status: DC | PRN
  Administered 2016-04-13 – 2016-04-14 (×4): 10 mg via ORAL
  Filled 2016-04-12 (×4): qty 2

## 2016-04-12 MED ORDER — PROCHLORPERAZINE EDISYLATE 5 MG/ML IJ SOLN: 10.0000 mg | Freq: Four times a day (QID) | INTRAMUSCULAR | Status: DC | PRN

## 2016-04-12 MED ORDER — MUPIROCIN CALCIUM 2 % EX CREA
TOPICAL_CREAM | Freq: Every day | CUTANEOUS | Status: DC
  Administered 2016-04-12 – 2016-04-14 (×3): via TOPICAL
  Filled 2016-04-12: qty 15

## 2016-04-12 NOTE — Progress Notes (Signed)
NURSING PROGRESS NOTE  Sabrina Maccini LanierMRN: [id number] Admission Data: 04/11/2016 at 9:45 PM Attending Provider: Lorretta Harp, MD PCP: [name], NP Code status: Full  Allergies:  Allergies  Allergen Reactions  . Zofran [Ondansetron Hcl] Hives    Spots around iv site  . Morphine And Related Itching  . Tessalon [Benzonatate] Other (See Comments)    Watery eyes  . Droperidol Palpitations    Past Medical History:  Past Medical History:  Diagnosis Date  . Arthritis   . Bullous pemphigus   . Cat bite 06/2014   to left elbow  . History of blood transfusion 1988   "when I had my baby"  . 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 Poisoning, snake bite 04/08/2016   "copperhead; RUE"  . PONV (postoperative nausea and vomiting)   . Situational anxiety   . Staph infection     Past Surgical History:  Past Surgical History:  Procedure Laterality Date  . APPENDECTOMY  ~ 1987  . APPLICATION OF A-CELL OF EXTREMITY Left 08/05/2015   Procedure: APPLICATION OF A-CELL OF EXTREMITY;  Surgeon: Peggye Form, DO;  Location: [facility];  Service: Plastics;  Laterality: Left;  . BREAST SURGERY Right 1990   "mild duct taken out"  . DEBRIDEMENT AND CLOSURE WOUND Left 07/01/2015   Procedure: LEFT ELBOW EXCISION OF WOUND WITH PRIMARY CLOSURE 2X5 CM ;  Surgeon: Peggye Form, DO;  Location: [facility];  Service: Plastics;  Laterality: Left;  . ELBOW SURGERY  X 23 in [place] <06/2015   from a cat bite; all I&D  . I&D EXTREMITY Left 07/08/2015   Procedure: IRRIGATION AND DEBRIDEMENT EXTREMITY, DRAINAGE OF LEFT ARM WOUND, A-CELL PLACEMENT, WOUND VAC PLACEMENT;  Surgeon: Alena Bills Dillingham, DO;  Location: [facility];  Service: Plastics;  Laterality: Left;  . INCISION AND DRAINAGE OF WOUND Left 08/05/2015   Procedure: IRRIGATION AND DEBRIDEMENT LEFT ELBOW WOUND, PLACEMENT OF ACELL;  Surgeon: Peggye Form, DO;  [facility];  Service:  Plastics;  Laterality: Left;  . LAPAROSCOPIC CHOLECYSTECTOMY  1998  . SKIN GRAFT Left 2016   took from anterior thigh; placed at elbow  . TONSILLECTOMY  ~ 2000  . TOTAL ABDOMINAL HYSTERECTOMY  2003    Sabrina [name] is a 50 y.o.  female patient, arrived to floor in room 5W27 via stretcher, transferred from ED. Patient alert and oriented X 4. No acute distress noted. Complains of pain 6/10 in right arm and right pointer finger.   Vital signs: Oral temperature 97.4 F (36.3 C), Blood pressure 86/52, Pulse 68, RR 18, SpO2 100 % on room air. Height 5'4", weight 180.9 lbs (81.92 kg).   Cardiac monitoring: Telemetry box 5W # 06 in place.  IV access: Left forearm infusing; condition patent and no redness; dressing is clean, dry, and intact  Skin: no pressure ulcer noted in sacral area; redness and blister noted on right arm and right pointer finger related to snake bite.  Patient's ID armband verified with patient and in place. Information packet given to patient. Fall risk assessed, SR up X2, patient able to verbalize understanding of risks associated with falls and to call nurse or staff to assist before getting out of bed. Patient oriented to room and equipment. Call bell within reach.

## 2016-04-12 NOTE — Consult Note (Signed)
WOC Nurse wound consult note Reason for Consult: traumatic injury to right  Index finger.  Patient states she was bitten by a copperhead.  Two nonintact lesions to noted to finger.  No eschar or devitalized tissue.  Hand is warm to touch.  Wound type:trauma Pressure Ulcer POA: N/A Measurement: 1 cm diameter and 0.5 cm in diameter nonintact ruptured blisters Wound TKW:IOXB and moist Drainage (amount, consistency, odor) None noted.  Periwound:Edema  No erythema noted today.  Dressing procedure/placement/frequency:Cleanse wounds to right finger with NS and pat gently dry.  Apply Bactroban to wound bed. Cover with 2x2 gauze and kerlix. Tape.  Change daily.  Will not follow at this time.  Please re-consult if needed.  Maple Hudson RN BSN [facility] Pager [phone]

## 2016-04-12 NOTE — Care Management Note (Addendum)
Case Management Note  Patient Details  Name: Sabrina [name] MRN: [id number] Date of Birth: 01-03-1966  Subjective/Objective:      Presents with cellulitis  of R upper extremity 2/2 snake bite. Lives alone. Independent with ADL's PTA, no DME usage.               Action/Plan: Return to home when medically stable.CM to f/u with d/c needs. Pt with no insurance , post hospital f/u scheduled for 05/03/2016 @ 11:30,  [facility]( [facility]). At d/c pt can take prescriptions to [facility] pharmacy for filling to assist with medication issue(cost).    Expected Discharge Date:                  Expected Discharge Plan:  Home/Self Care (lives alone)  In-House Referral:     Discharge planning Services  CM Consult, Follow-up appt scheduled, [facility], Medication Assistance  Post Acute Care Choice:    Choice offered to:     DME Arranged:    DME Agency:     HH Arranged:    HH Agency:     Status of Service:  Completed, signed off  If discussed at [employer], dates discussed:    Additional Comments:  Epifanio Lesches, RN 04/12/2016, 11:19 AM

## 2016-04-12 NOTE — Progress Notes (Signed)
PROGRESS NOTE    Sabrina [name]  FIE:[id number] DOB: 10/19/1965 DOA: 04/11/2016 PCP: [name], NP    Brief Narrative:  50 year old female history of bullous pemphigus, arthritis, osteoarthritis presented with right upper extremity pain and swelling/cellulitis after a snake bite to the right index finger on 04/08/2016. Patient failed outpatient treatment with oral Augmentin. Patient admitted for right upper extremity cellulitis on empiric IV vancomycin and IV Zosyn with clinical improvement.   Assessment & Plan:   Principal Problem:   Cellulitis of right upper extremity Active Problems:   Anxiety state   Cellulitis   #1 right upper extremity cellulitis Secondary to snakebite. Per admitting physician a physician discussed the case with poison center who reviewed pictures of patient's upper extremity and fell did not warrant administration of CroFab. Right upper extremity cellulitis improved significantly. Blood cultures pending. Continue empiric IV antibiotics of vancomycin and Zosyn. Could probably narrowed down to oral antibiotics tomorrow. Change oral pain medications of Vicodin to oral oxycodone as needed. IV Dilaudid when necessary.  #2 anxiety/depression Continue home regimen of Klonopin as needed.   DVT prophylaxis: SCDs Code Status: Full Family Communication: Updated patient. No family present. Disposition Plan: Home when medically stable and on oral antibiotics, hopefully in 1-2 days.   Consultants:   None  Procedures:   Chest x-ray 04/11/2016  Antimicrobials:   IV Zosyn 04/11/2016  IV vancomycin 04/12/2016   Subjective: Patient states improvement in swelling right upper extremity. Patient complaining of some nausea which she associates with her pain.  Objective: Vitals:   04/12/16 0151 04/12/16 0318 04/12/16 0504 04/12/16 0507  BP: 92/64 (!) 98/53 (!) 90/57 95/78  Pulse: 76 77 68 77  Resp:   14   Temp:   97.7 F (36.5 C)   TempSrc:   Oral     SpO2:   100% 100%  Weight:      Height:        Intake/Output Summary (Last 24 hours) at 04/12/16 1312 Last data filed at 04/12/16 0955  Gross per 24 hour  Intake          1133.33 ml  Output             1250 ml  Net          -116.67 ml   Filed Weights   04/11/16 1654 04/11/16 2129  Weight: 83.9 kg (185 lb) 81.9 kg (180 lb 9.6 oz)    Examination:  General exam: Appears calm and comfortable  Respiratory system: Clear to auscultation. Respiratory effort normal. Cardiovascular system: S1 & S2 heard, RRR. No JVD, murmurs, rubs, gallops or clicks. No pedal edema. Gastrointestinal system: Abdomen is nondistended, soft and nontender. No organomegaly or masses felt. Normal bowel sounds heard. Central nervous system: Alert and oriented. No focal neurological deficits. Extremities: Symmetric 5 x 5 power. Skin: Right upper extremity with small skin tear about 1 x 1.5 cm in size on right index finger, decreased swelling, decreased redness and warmth, less tender to palpation. Psychiatry: Judgement and insight appear normal. Mood & affect appropriate.     Data Reviewed: I have personally reviewed following labs and imaging studies  CBC:  Recent Labs Lab 04/10/16 1241 04/11/16 1728 04/12/16 0514  WBC 4.0 4.6 5.1  NEUTROABS 1.9 1.8  --   HGB 12.9 12.3 9.6*  HCT 40.5 38.4 30.7*  MCV 94.4 95.0 96.8  PLT 257 270 208   Basic Metabolic Panel:  Recent Labs Lab 04/10/16 1241 04/11/16 1728 04/12/16 0514  NA  140 139 139  K 3.7 3.7 3.6  CL 108 107 110  CO2 GLUCOSE 87 88 87  BUN 18 21* 17  CREATININE 0.81 0.85 0.94  CALCIUM 9.2 9.3 8.2*   GFR: Estimated Creatinine Clearance: 76.6 mL/min (by C-G formula based on SCr of 0.94 mg/dL). Liver Function Tests:  Recent Labs Lab 04/11/16 1728  AST 22  ALT 17  ALKPHOS 95  BILITOT 0.5  PROT 6.9  ALBUMIN 4.2   No results for input(s): LIPASE, AMYLASE in the last 168 hours. No results for input(s): AMMONIA in the last  168 hours. Coagulation Profile:  Recent Labs Lab 04/11/16 2007  INR 1.05   Cardiac Enzymes: No results for input(s): CKTOTAL, CKMB, CKMBINDEX, TROPONINI in the last 168 hours. BNP (last 3 results) No results for input(s): PROBNP in the last 8760 hours. HbA1C: No results for input(s): HGBA1C in the last 72 hours. CBG: No results for input(s): GLUCAP in the last 168 hours. Lipid Profile: No results for input(s): CHOL, HDL, LDLCALC, TRIG, CHOLHDL, LDLDIRECT in the last 72 hours. Thyroid Function Tests: No results for input(s): TSH, T4TOTAL, FREET4, T3FREE, THYROIDAB in the last 72 hours. Anemia Panel:  Recent Labs  04/12/16 1054  VITAMINB12 630  FOLATE 13.4  FERRITIN 25  TIBC 326  IRON 193*  RETICCTPCT 1.1   Sepsis Labs:  Recent Labs Lab 04/11/16 1740 04/11/16 2028  LATICACIDVEN 0.81 1.60    No results found for this or any previous visit (from the past 240 hour(s)).       Radiology Studies: Dg Chest 2 View  Result Date: 04/11/2016 CLINICAL DATA:  Pt states she has been coughing up blood this afternoon after being bit by a copperhead on Friday. Pt also c/o nausea. No hx of heart or lung problems. Pt is a nonsmoker. EXAM: CHEST  2 VIEW COMPARISON:  None. FINDINGS: The heart size and mediastinal contours are within normal limits. Both lungs are clear. The visualized skeletal structures are unremarkable. IMPRESSION: No active cardiopulmonary disease. Electronically Signed   By: Norva Pavlov M.D.   On: 04/11/2016 17:57        Scheduled Meds: . famotidine  20 mg Oral BID  . gabapentin  600 mg Oral TID  . mupirocin cream   Topical Daily  . piperacillin-tazobactam (ZOSYN)  IV  3.375 g Intravenous Q8H  . sodium chloride flush  3 mL Intravenous Q12H  . vancomycin  1,000 mg Intravenous Q12H   Continuous Infusions: . sodium chloride 100 mL/hr at 04/12/16 0607     LOS: 0 days    Time spent: 35 minutes    Lilianna Case, MD [facility] Pager  [phone]  If 7PM-7AM, please contact night-coverage [email or web address] Password [email or web address] 04/12/2016, 1:12 PM

## 2016-04-12 NOTE — Progress Notes (Signed)
Chaplain stopped in to check on Pt. Pt was concerned about her phone dying.Marland KitchenMarland KitchenChaplain will check on this a little later on. Thanks.

## 2016-04-13 LAB — BASIC METABOLIC PANEL
Anion gap: 6 (ref 5–15)
BUN: 13 mg/dL (ref 6–20)
CALCIUM: 8.8 mg/dL — AB (ref 8.9–10.3)
CO2: 25 mmol/L (ref 22–32)
CREATININE: 0.92 mg/dL (ref 0.44–1.00)
Chloride: 110 mmol/L (ref 101–111)
GLUCOSE: 89 mg/dL (ref 65–99)
Potassium: 4.1 mmol/L (ref 3.5–5.1)
Sodium: 141 mmol/L (ref 135–145)

## 2016-04-13 LAB — CBC
HEMATOCRIT: 34.3 % — AB (ref 36.0–46.0)
Hemoglobin: 10.8 g/dL — ABNORMAL LOW (ref 12.0–15.0)
MCH: 30.2 pg (ref 26.0–34.0)
MCHC: 31.5 g/dL (ref 30.0–36.0)
MCV: 95.8 fL (ref 78.0–100.0)
PLATELETS: 202 10*3/uL (ref 150–400)
RBC: 3.58 MIL/uL — ABNORMAL LOW (ref 3.87–5.11)
RDW: 13.8 % (ref 11.5–15.5)
WBC: 3.7 10*3/uL — ABNORMAL LOW (ref 4.0–10.5)

## 2016-04-13 LAB — URINE CULTURE
CULTURE: NO GROWTH
Culture: NO GROWTH

## 2016-04-13 NOTE — Progress Notes (Signed)
[facility]                                                                              Patient Demographics  Sabrina [name], is a 50 y.o. female, DOB - 17-Mar-1966, FAO:[id number]  Admit date - 04/11/2016   Admitting Physician Lorretta Harp, MD  Outpatient Primary MD for the patient is [name], NP  Outpatient specialists:   LOS - 1  days    Chief Complaint  Patient presents with  . Snake Bite       Brief summary   50 year old female history of bullous pemphigus, arthritis, osteoarthritis presented with right upper extremity pain and swelling/cellulitis after a snake bite to the right index finger on 04/08/2016. Patient failed outpatient treatment with oral Augmentin. Patient admitted for right upper extremity cellulitis on empiric IV vancomycin and IV Zosyn with clinical improvement.    Assessment & Plan   right upper extremity cellulitis Secondary to snakebite. - EDP had discussed with poison center and did not warrant administration of CroFab. Right upper extremity cellulitis is improving -  Blood cultures negative so far, on IV antibiotics vancomycin and Zosyn. - Discussed with Dr. Algis Liming, patient has been complaining of significant pain and numbness in the thumb and the finger where the snakebite is. Per Dr. Algis Liming, okay to transition to IV Zosyn and oral antibiotics once cellulitis is improving.  - Patient is on Neurontin and pain medications will continue, pain and numbness is expected due to snake venom.  - Discontinued vancomycin  Anxiety/depression Continue home regimen of Klonopin as needed.  Code Status: FC DVT Prophylaxis:  SCD's Family Communication: Discussed in detail with the patient, all imaging results, lab results explained to the patient    Disposition Plan: dc in 24-48hrs   Time Spent in minutes  25 minutes  Procedures:  None  Consultants:   None  Antimicrobials:   IV vancomycin  IV  Zosyn   Medications  Scheduled Meds: . docusate sodium  100 mg Oral BID  . famotidine  20 mg Oral BID  . gabapentin  600 mg Oral TID  . mupirocin cream   Topical Daily  . piperacillin-tazobactam (ZOSYN)  IV  3.375 g Intravenous Q8H  . sodium chloride flush  3 mL Intravenous Q12H  . vancomycin  1,000 mg Intravenous Q12H   Continuous Infusions:  PRN Meds:.acetaminophen **OR** acetaminophen, clonazePAM, diphenhydrAMINE, HYDROmorphone (DILAUDID) injection, oxyCODONE, prochlorperazine   Antibiotics   Anti-infectives    Start     Dose/Rate Route Frequency Ordered Stop   04/12/16 0800  vancomycin (VANCOCIN) IVPB 1000 mg/200 mL premix     1,000 mg 200 mL/hr over 60 Minutes Intravenous Every 12 hours 04/11/16 2042     04/11/16 2100  piperacillin-tazobactam (ZOSYN) IVPB 3.375 g     3.375 g 12.5 mL/hr over 240 Minutes Intravenous Every 8 hours 04/11/16 2041     04/11/16 2030  vancomycin (VANCOCIN) IVPB 1000 mg/200 mL premix  Status:  Discontinued     1,000 mg 200 mL/hr over 60 Minutes Intravenous  Once 04/11/16 2028 04/11/16 2030   04/11/16 2015  vancomycin (VANCOCIN) 1,250 mg in sodium chloride 0.9 %  250 mL IVPB     1,250 mg 166.7 mL/hr over 90 Minutes Intravenous  Once 04/11/16 1947 04/11/16 2142   04/11/16 1945  vancomycin (VANCOCIN) 2,000 mg in sodium chloride 0.9 % 500 mL IVPB  Status:  Discontinued     2,000 mg 250 mL/hr over 120 Minutes Intravenous  Once 04/11/16 1938 04/11/16 1946        Subjective:   Sabrina [name] was seen and examined today. Continues to complain of pain in the hand and thumb and the first finger. Patient denies dizziness, chest pain, shortness of breath, abdominal pain, N/V/D/C, new weakness. No acute events overnight.    Objective:   Vitals:   04/12/16 0504 04/12/16 0507 04/12/16 2059 04/13/16 0551  BP: (!) 90/57 95/78 100/68 92/60  Pulse: 68 77 66 81  Resp: 14  16 17   Temp: 97.7 F (36.5 C)  97.8 F (36.6 C) 97.7 F (36.5 C)  TempSrc: Oral   Oral Oral  SpO2: 100% 100% 100% 100%  Weight:      Height:        Intake/Output Summary (Last 24 hours) at 04/13/16 1308 Last data filed at 04/13/16 0928  Gross per 24 hour  Intake              480 ml  Output              300 ml  Net              180 ml     Wt Readings from Last 3 Encounters:  04/11/16 81.9 kg (180 lb 9.6 oz)  01/18/16 83.5 kg (184 lb)  11/22/15 86.2 kg (190 lb)     Exam  General: Alert and oriented x 3, NAD  HEENT:  PERRLA, EOMI, Anicteric Sclera, mucous membranes moist.   Neck: Supple, no JVD, no masses  Cardiovascular: S1 S2 auscultated, no rubs, murmurs or gallops. Regular rate and rhythm.  Respiratory: Clear to auscultation bilaterally, no wheezing, rales or rhonchi  Gastrointestinal: Soft, nontender, nondistended, + bowel sounds  Ext: no cyanosis clubbing or edema  Neuro: AAOx3, Cr N's II- XII. Strength 5/5 upper and lower extremities bilaterally  Skin: 2 small open areas at the site of snake bite however no purulent discharge. Cellulitis improving  Psych: Normal affect and demeanor, alert and oriented x3    Data Reviewed:  I have personally reviewed following labs and imaging studies  Micro Results Recent Results (from the past 240 hour(s))  Culture, blood (Routine x 2)     Status: None (Preliminary result)   Collection Time: 04/11/16  5:28 PM  Result Value Ref Range Status   Specimen Description BLOOD LEFT HAND  Final   Special Requests 5CC  Final   Culture NO GROWTH < 24 HOURS  Final   Report Status PENDING  Incomplete  Urine culture     Status: None   Collection Time: 04/11/16  7:32 PM  Result Value Ref Range Status   Specimen Description URINE, RANDOM  Final   Special Requests NONE  Final   Culture NO GROWTH  Final   Report Status 04/13/2016 FINAL  Final  Culture, blood (Routine x 2)     Status: None (Preliminary result)   Collection Time: 04/11/16  8:07 PM  Result Value Ref Range Status   Specimen Description BLOOD LEFT  HAND  Final   Special Requests 1CC  Final   Culture NO GROWTH < 24 HOURS  Final   Report Status PENDING  Incomplete  Urine culture     Status: None   Collection Time: 04/12/16  2:13 AM  Result Value Ref Range Status   Specimen Description URINE, RANDOM  Final   Special Requests NONE  Final   Culture NO GROWTH  Final   Report Status 04/13/2016 FINAL  Final    Radiology Reports Dg Chest 2 View  Result Date: 04/11/2016 CLINICAL DATA:  Pt states she has been coughing up blood this afternoon after being bit by a copperhead on Friday. Pt also c/o nausea. No hx of heart or lung problems. Pt is a nonsmoker. EXAM: CHEST  2 VIEW COMPARISON:  None. FINDINGS: The heart size and mediastinal contours are within normal limits. Both lungs are clear. The visualized skeletal structures are unremarkable. IMPRESSION: No active cardiopulmonary disease. Electronically Signed   By: Norva Pavlov M.D.   On: 04/11/2016 17:57    Lab Data:  CBC:  Recent Labs Lab 04/10/16 1241 04/11/16 1728 04/12/16 0514 04/13/16 0525  WBC 4.0 4.6 5.1 3.7*  NEUTROABS 1.9 1.8  --   --   HGB 12.9 12.3 9.6* 10.8*  HCT 40.5 38.4 30.7* 34.3*  MCV 94.4 95.0 96.8 95.8  PLT 257 270 208 202   Basic Metabolic Panel:  Recent Labs Lab 04/10/16 1241 04/11/16 1728 04/12/16 0514 04/13/16 0525  NA 140 139 139 141  K 3.7 3.7 3.6 4.1  CL 108 107 110 110  CO2 24 24 26 25   GLUCOSE 87 88 87 89  BUN 18 21* 17 13  CREATININE 0.81 0.85 0.94 0.92  CALCIUM 9.2 9.3 8.2* 8.8*   GFR: Estimated Creatinine Clearance: 78.3 mL/min (by C-G formula based on SCr of 0.92 mg/dL). Liver Function Tests:  Recent Labs Lab 04/11/16 1728  AST 22  ALT 17  ALKPHOS 95  BILITOT 0.5  PROT 6.9  ALBUMIN 4.2   No results for input(s): LIPASE, AMYLASE in the last 168 hours. No results for input(s): AMMONIA in the last 168 hours. Coagulation Profile:  Recent Labs Lab 04/11/16 2007  INR 1.05   Cardiac Enzymes: No results for input(s):  CKTOTAL, CKMB, CKMBINDEX, TROPONINI in the last 168 hours. BNP (last 3 results) No results for input(s): PROBNP in the last 8760 hours. HbA1C: No results for input(s): HGBA1C in the last 72 hours. CBG: No results for input(s): GLUCAP in the last 168 hours. Lipid Profile: No results for input(s): CHOL, HDL, LDLCALC, TRIG, CHOLHDL, LDLDIRECT in the last 72 hours. Thyroid Function Tests: No results for input(s): TSH, T4TOTAL, FREET4, T3FREE, THYROIDAB in the last 72 hours. Anemia Panel:  Recent Labs  04/12/16 1054  VITAMINB12 630  FOLATE 13.4  FERRITIN 25  TIBC 326  IRON 193*  RETICCTPCT 1.1   Urine analysis:    Component Value Date/Time   COLORURINE YELLOW 04/12/2016 0213   APPEARANCEUR CLEAR 04/12/2016 0213   LABSPEC 1.019 04/12/2016 0213   PHURINE 5.5 04/12/2016 0213   GLUCOSEU NEGATIVE 04/12/2016 0213   HGBUR NEGATIVE 04/12/2016 0213   BILIRUBINUR NEGATIVE 04/12/2016 0213   KETONESUR NEGATIVE 04/12/2016 0213   PROTEINUR NEGATIVE 04/12/2016 0213   UROBILINOGEN 0.2 07/05/2015 0627   NITRITE NEGATIVE 04/12/2016 0213   LEUKOCYTESUR SMALL (A) 04/12/2016 1610     Sabrina [name] M.D. [facility] 04/13/2016, 1:08 PM  Pager: [phone] Between 7am to 7pm - call Pager - [phone]  After 7pm go to [email or web address] - password [email or web address]  Call night coverage person covering after 7pm

## 2016-04-14 DIAGNOSIS — L0390 Cellulitis, unspecified: Secondary | ICD-10-CM

## 2016-04-14 MED ORDER — GABAPENTIN 400 MG PO CAPS: 800.0000 mg | ORAL_CAPSULE | Freq: Three times a day (TID) | ORAL | 0 refills | Status: DC

## 2016-04-14 MED ORDER — MUPIROCIN CALCIUM 2 % EX CREA: TOPICAL_CREAM | Freq: Every day | CUTANEOUS | 1 refills | Status: DC

## 2016-04-14 MED ORDER — DOXYCYCLINE HYCLATE 100 MG PO TABS: 100.0000 mg | ORAL_TABLET | Freq: Two times a day (BID) | ORAL | 0 refills | Status: DC

## 2016-04-14 MED ORDER — OXYCODONE HCL 5 MG PO TABS: 5.0000 mg | ORAL_TABLET | ORAL | 0 refills | Status: DC | PRN

## 2016-04-14 MED ORDER — PROMETHAZINE HCL 12.5 MG PO TABS: 12.5000 mg | ORAL_TABLET | Freq: Four times a day (QID) | ORAL | 0 refills | Status: DC | PRN

## 2016-04-14 MED ORDER — DOXYCYCLINE HYCLATE 100 MG PO TABS
100.0000 mg | ORAL_TABLET | Freq: Two times a day (BID) | ORAL | Status: DC
  Administered 2016-04-14: 100 mg via ORAL
  Filled 2016-04-14: qty 1

## 2016-04-14 MED FILL — PROMETHAZINE 12.5 MG TABLET: 12.5 | 7 days supply | Qty: 30 | Fill #0

## 2016-04-14 MED FILL — oxyCODONE HCL 5 MG TABS: 5 | 2 days supply | Qty: 15 | Fill #0

## 2016-04-14 MED FILL — DOXYCYCLINE HYCLATE 100 MG: 100 | 7 days supply | Qty: 14 | Fill #0

## 2016-04-14 NOTE — Progress Notes (Signed)
NURSING PROGRESS NOTE  JENE STBERNARD [id number] Discharge Data: 04/14/2016 12:33 PM Attending Provider: Cathren Harsh, MD YOF:[id number], Bonita Quin, NP   [name] to be D/C'd Home per MD order.    All IV's will be discontinued and monitored for bleeding.  All belongings will be returned to patient for patient to take home.  Last Documented Vital Signs:  Blood pressure (!) 89/59, pulse 67, temperature 97.9 F (36.6 C), temperature source Oral, resp. rate 20, height 5\' 4"  (1.626 m), weight 81.9 kg (180 lb 9.6 oz), SpO2 100 %.  Madelin Rear, MSN, RN, [employer]

## 2016-04-14 NOTE — Discharge Summary (Signed)
Physician Discharge Summary   Patient ID: Sabrina [name] MRN: [id number] DOB/AGE: 1966/01/04 50 y.o.  Admit date: 04/11/2016 Discharge date: 04/14/2016  Primary Care Physician:  Sabrina Living, NP  Discharge Diagnoses:    . Anxiety state . Cellulitis of right upper extremity . Status post snake bite   Intractable pain in the right thumb and first finger at the area of the snake bite   Consults:  None  Recommendations for Outpatient Follow-up:  1. Please repeat CBC/BMET at next visit   DIET: Heart healthy diet    Allergies:   Allergies  Allergen Reactions  . Zofran [Ondansetron Hcl] Hives    Spots around iv site  . Morphine And Related Itching  . Tessalon [Benzonatate] Other (See Comments)    Watery eyes  . Droperidol Palpitations     DISCHARGE MEDICATIONS: Current Discharge Medication List    START taking these medications   Details  doxycycline (VIBRA-TABS) 100 MG tablet Take 1 tablet (100 mg total) by mouth 2 (two) times daily. X 7days Qty: 14 tablet, Refills: 0    mupirocin cream (BACTROBAN) 2 % Apply topically daily. Cleanse wounds to right finger with NS and pat gently dry.  Apply Bactroban to wound bed. Cover with 2x2 gauze and kerlix. Tape.  Change daily. Qty: 15 g, Refills: 1    oxyCODONE (OXY IR/ROXICODONE) 5 MG immediate release tablet Take 1 tablet (5 mg total) by mouth every 4 (four) hours as needed for severe pain. Qty: 15 tablet, Refills: 0    promethazine (PHENERGAN) 12.5 MG tablet Take 1 tablet (12.5 mg total) by mouth every 6 (six) hours as needed for nausea or vomiting. Qty: 30 tablet, Refills: 0      CONTINUE these medications which have CHANGED   Details  gabapentin (NEURONTIN) 400 MG capsule Take 2 capsules (800 mg total) by mouth 3 (three) times daily. 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    clonazePAM (KLONOPIN) 0.5 MG tablet Take 0.5 mg by mouth 2 (two) times daily as needed for anxiety.  Refills: 1      STOP taking these medications     amoxicillin-clavulanate (AUGMENTIN) 875-125 MG tablet      HYDROcodone-acetaminophen (NORCO/VICODIN) 5-325 MG tablet          Brief H and P: For complete details please refer to admission H and P, but in brief 50 year old [name] history of bullous pemphigus, arthritis, osteoarthritis presented with right upper extremity pain and swelling/cellulitis after a snake bite to the right index finger on 04/08/2016. Patient failed outpatient treatment with oral Augmentin. Patient admitted for right upper extremity cellulitis on empiric IV vancomycin and IV Zosyn with clinical improvement.   Hospital Course:  right upper extremity cellulitis Secondary to snakebite. - EDP had discussed with poison center and did not warrant administration of CroFab. Right upper extremity cellulitis is improving -  Blood cultures negative so far, the patient was placed on IV antibiotics vancomycin and Zosyn. - Discussed with Dr. Algis Liming, patient has been complaining of significant pain and numbness in the thumb and the finger where the snakebite is. Per Dr. Algis Liming, okay to transition to IV Zosyn and oral antibiotics once cellulitis is improving.  - cellulitis is improving, patient was  transitioned to oral doxycycline For 7 days.  Intractable pain  Patient is on Neurontin and pain medications will continue, pain and numbness is expected due to snake venom.  -For now increase Neurontin, gave prescription of #15 tabs of oxycodone for severe pain  Anxiety/depression Continue home regimen of Klonopin as needed.  Day of Discharge BP (!) 89/59 (BP Location: Right Arm)   Pulse 67   Temp 97.9 F (36.6 C) (Oral)   Resp 20   Ht   (1.626 m)   Wt 81.9 kg (180 lb 9.6 oz)   SpO2 100%   BMI 31.00 kg/m   Physical Exam: General: Alert and awake oriented x3 not in any acute distress. HEENT: anicteric sclera, pupils reactive to light and accommodation CVS: S1-S2 clear no murmur rubs or gallops Chest: clear to auscultation bilaterally, no wheezing rales or rhonchi Abdomen: soft nontender, nondistended, normal bowel sounds Extremities: no cyanosis, clubbing or edema noted bilaterally Neuro: Cranial nerves II-XII intact, no focal neurological deficits   The results of significant diagnostics from this hospitalization (including imaging, microbiology, ancillary and laboratory) are listed below for reference.    LAB RESULTS: Basic Metabolic Panel:  Recent Labs Lab 04/12/16 0514 04/13/16 0525  NA 139 141  K 3.6 4.1  CL 110 110  CO2 26 25  GLUCOSE 87 89  BUN 17 13  CREATININE 0.94 0.92  CALCIUM 8.2* 8.8*   Liver Function Tests:  Recent Labs Lab 04/11/16 1728  AST 22  ALT 17  ALKPHOS 95  BILITOT 0.5  PROT 6.9  ALBUMIN 4.2   No results for input(s): LIPASE, AMYLASE in the last 168 hours. No results for input(s): AMMONIA in the last 168 hours. CBC:  Recent Labs Lab 04/11/16 1728 04/12/16 0514 04/13/16 0525  WBC 4.6 5.1 3.7*  NEUTROABS 1.8  --   --   HGB 12.3 9.6* 10.8*  HCT 38.4 30.7* Sabrina.3*  MCV 95.0 96.8 95.8  PLT 270 208 202   Cardiac Enzymes: No results for input(s): CKTOTAL, CKMB, CKMBINDEX, TROPONINI in the last 168 hours. BNP: Invalid input(s): POCBNP CBG: No results for input(s): GLUCAP in the last 168 hours.  Significant Diagnostic Studies:  Dg Chest 2 View  Result Date: 04/11/2016 CLINICAL DATA:  Pt states she has been coughing up blood this afternoon after being bit by a copperhead on Friday. Pt also c/o nausea. No hx of heart or lung problems. Pt is a nonsmoker. EXAM: CHEST  2 VIEW COMPARISON:  None. FINDINGS: The heart size and mediastinal contours are within normal  limits. Both lungs are clear. The visualized skeletal structures are unremarkable. IMPRESSION: No active cardiopulmonary disease. Electronically Signed   By: Norva Pavlov M.D.   On: 04/11/2016 17:57    2D ECHO:   Disposition and Follow-up: Discharge Instructions    Diet - low sodium heart healthy    Complete by:  As directed   Increase activity slowly    Complete by:  As directed       DISPOSITION: Home  DISCHARGE FOLLOW-UP Follow-up Information    [name], [name], NP Follow up in 10 day(s).   Specialty:  Family Medicine Contact information: [address] [place] [address] [phone]            Time spent on Discharge:   Signed:   Emett Stapel M.D. [facility] 04/14/2016, 11:Sabrina AM Pager: [phone]

## 2016-04-16 LAB — CULTURE, BLOOD (ROUTINE X 2)
CULTURE: NO GROWTH
Culture: NO GROWTH

## 2016-04-18 ENCOUNTER — Encounter (HOSPITAL_COMMUNITY): Payer: Self-pay | Admitting: *Deleted

## 2016-04-18 ENCOUNTER — Emergency Department (HOSPITAL_COMMUNITY): Payer: Self-pay

## 2016-04-18 ENCOUNTER — Inpatient Hospital Stay (HOSPITAL_COMMUNITY)
Admission: EM | Admit: 2016-04-18 | Discharge: 2016-04-24 | DRG: 603 | Disposition: A | Discharge status: Home / Self Care | Payer: Self-pay | Attending: Internal Medicine | Admitting: Internal Medicine

## 2016-04-18 DIAGNOSIS — Z79899 Other long term (current) drug therapy: Secondary | ICD-10-CM

## 2016-04-18 DIAGNOSIS — L089 Local infection of the skin and subcutaneous tissue, unspecified: Secondary | ICD-10-CM

## 2016-04-18 DIAGNOSIS — M25439 Effusion, unspecified wrist: Secondary | ICD-10-CM

## 2016-04-18 DIAGNOSIS — T148XXA Other injury of unspecified body region, initial encounter: Secondary | ICD-10-CM

## 2016-04-18 DIAGNOSIS — M25431 Effusion, right wrist: Secondary | ICD-10-CM

## 2016-04-18 DIAGNOSIS — W5911XA Bitten by nonvenomous snake, initial encounter: Secondary | ICD-10-CM | POA: Diagnosis present

## 2016-04-18 DIAGNOSIS — T63001D Toxic effect of unspecified snake venom, accidental (unintentional), subsequent encounter: Secondary | ICD-10-CM

## 2016-04-18 DIAGNOSIS — M79641 Pain in right hand: Secondary | ICD-10-CM

## 2016-04-18 DIAGNOSIS — Z885 Allergy status to narcotic agent status: Secondary | ICD-10-CM

## 2016-04-18 DIAGNOSIS — L03011 Cellulitis of right finger: Principal | ICD-10-CM | POA: Diagnosis present

## 2016-04-18 DIAGNOSIS — F419 Anxiety disorder, unspecified: Secondary | ICD-10-CM | POA: Diagnosis present

## 2016-04-18 DIAGNOSIS — R202 Paresthesia of skin: Secondary | ICD-10-CM

## 2016-04-18 DIAGNOSIS — L109 Pemphigus, unspecified: Secondary | ICD-10-CM | POA: Diagnosis present

## 2016-04-18 DIAGNOSIS — Z888 Allergy status to other drugs, medicaments and biological substances status: Secondary | ICD-10-CM

## 2016-04-18 DIAGNOSIS — L02511 Cutaneous abscess of right hand: Secondary | ICD-10-CM | POA: Diagnosis present

## 2016-04-18 DIAGNOSIS — L02519 Cutaneous abscess of unspecified hand: Secondary | ICD-10-CM

## 2016-04-18 LAB — I-STAT CG4 LACTIC ACID, ED: Lactic Acid, Venous: 0.87 mmol/L (ref 0.5–1.9)

## 2016-04-18 LAB — CBC WITH DIFFERENTIAL/PLATELET
Basophils Absolute: 0 10*3/uL (ref 0.0–0.1)
Basophils Relative: 0 %
Eosinophils Absolute: 0.2 10*3/uL (ref 0.0–0.7)
Eosinophils Relative: 4 %
HCT: 34.9 % — ABNORMAL LOW (ref 36.0–46.0)
Hemoglobin: 11.2 g/dL — ABNORMAL LOW (ref 12.0–15.0)
Lymphocytes Relative: 41 %
Lymphs Abs: 1.8 10*3/uL (ref 0.7–4.0)
MCH: 30.4 pg (ref 26.0–34.0)
MCHC: 32.1 g/dL (ref 30.0–36.0)
MCV: 94.8 fL (ref 78.0–100.0)
Monocytes Absolute: 0.4 10*3/uL (ref 0.1–1.0)
Monocytes Relative: 10 %
Neutro Abs: 2 10*3/uL (ref 1.7–7.7)
Neutrophils Relative %: 45 %
Platelets: 177 10*3/uL (ref 150–400)
RBC: 3.68 MIL/uL — ABNORMAL LOW (ref 3.87–5.11)
RDW: 13.5 % (ref 11.5–15.5)
WBC: 4.4 10*3/uL (ref 4.0–10.5)

## 2016-04-18 LAB — COMPREHENSIVE METABOLIC PANEL
ALT: 17 U/L (ref 14–54)
AST: 24 U/L (ref 15–41)
Albumin: 3.6 g/dL (ref 3.5–5.0)
Alkaline Phosphatase: 79 U/L (ref 38–126)
Anion gap: 6 (ref 5–15)
BUN: 16 mg/dL (ref 6–20)
CO2: 27 mmol/L (ref 22–32)
Calcium: 9 mg/dL (ref 8.9–10.3)
Chloride: 107 mmol/L (ref 101–111)
Creatinine, Ser: 0.83 mg/dL (ref 0.44–1.00)
GFR calc Af Amer: >60 mL/min (ref >60)
GFR calc non Af Amer: >60 mL/min (ref >60)
Glucose, Bld: 95 mg/dL (ref 65–99)
Potassium: 3.5 mmol/L (ref 3.5–5.1)
Sodium: 140 mmol/L (ref 135–145)
Total Bilirubin: 0.2 mg/dL — ABNORMAL LOW (ref 0.3–1.2)
Total Protein: 6.1 g/dL — ABNORMAL LOW (ref 6.5–8.1)

## 2016-04-18 MED ORDER — MORPHINE SULFATE (PF) 4 MG/ML IV SOLN
4.0000 mg | Freq: Once | INTRAVENOUS | Status: DC
  Filled 2016-04-18: qty 1

## 2016-04-18 MED ORDER — MORPHINE SULFATE (PF) 4 MG/ML IV SOLN
4.0000 mg | Freq: Once | INTRAVENOUS | Status: AC
  Administered 2016-04-18: 4 mg via INTRAVENOUS
  Filled 2016-04-18: qty 1

## 2016-04-18 MED ORDER — PROMETHAZINE HCL 25 MG/ML IJ SOLN
12.5000 mg | Freq: Once | INTRAMUSCULAR | Status: DC
  Filled 2016-04-18: qty 1

## 2016-04-18 MED ORDER — PROMETHAZINE HCL 25 MG/ML IJ SOLN
12.5000 mg | Freq: Once | INTRAMUSCULAR | Status: AC
  Administered 2016-04-18: 12.5 mg via INTRAMUSCULAR
  Filled 2016-04-18: qty 1

## 2016-04-18 MED ORDER — MORPHINE SULFATE (PF) 4 MG/ML IV SOLN: 4.0000 mg | Freq: Once | INTRAVENOUS | Status: DC

## 2016-04-18 MED ORDER — MORPHINE SULFATE (PF) 4 MG/ML IV SOLN
4.0000 mg | Freq: Once | INTRAVENOUS | Status: AC
  Administered 2016-04-18: 4 mg via INTRAMUSCULAR
  Filled 2016-04-18: qty 1

## 2016-04-18 MED ORDER — PROMETHAZINE HCL 25 MG/ML IJ SOLN
12.5000 mg | Freq: Once | INTRAMUSCULAR | Status: AC
  Administered 2016-04-18: 12.5 mg via INTRAVENOUS
  Filled 2016-04-18: qty 1

## 2016-04-18 MED ORDER — FENTANYL CITRATE (PF) 100 MCG/2ML IJ SOLN
50.0000 ug | Freq: Once | INTRAMUSCULAR | Status: AC
Start: 2016-04-18 — End: 2016-04-18
  Administered 2016-04-18: 50 ug via INTRAVENOUS
  Filled 2016-04-18: qty 2

## 2016-04-18 MED ORDER — GADOBENATE DIMEGLUMINE 529 MG/ML IV SOLN
15.0000 mL | Freq: Once | INTRAVENOUS | Status: AC | PRN
  Administered 2016-04-18: 15 mL via INTRAVENOUS

## 2016-04-18 NOTE — ED Notes (Signed)
2 attempts for an IV but unsuccessful

## 2016-04-18 NOTE — ED Notes (Signed)
Called MRI to inquire about wait time.  MRI reports another hour

## 2016-04-18 NOTE — ED Provider Notes (Signed)
[facility]-EMERGENCY DEPT Provider Note   CSN: [id number] Arrival date & time: 04/18/16  [id number]  First Provider Contact:  First MD Initiated Contact with Patient 04/18/16 1020        History   Chief Complaint Chief Complaint  Patient presents with  . Emesis  . Wound Infection    HPI Sabrina [name] is a 50 y.o. female with recent history of hospital admission for snake bite to her right index finger who presents with worsening of pain and appearance of wound with associated nausea and vomiting since Saturday. Patient reports she was bit by a snake 2 weeks ago and admitted to the hospital for 1 week. Patient was discharged with Augmentin and finished antibiotics. Patient reports that her wound was stable until Saturday when she began having worsening pain that radiated up her right arm to her right ear. Patient also began having nausea and vomiting, and has not been able to keep anything down including her pain medication and antinausea medication. Patient reports paresthesias to the tip of the finger. Patient denies any streaking, but reports "red dots" at the base of her finger and thumb. Patient denies any fevers, headaches, chest pain, shortness of breath, abdominal pain, dysuria.  HPI  Past Medical History:  Diagnosis Date  . Arthritis   . Bullous pemphigus   . Cat bite 06/2014   to left elbow  . History of blood transfusion 1988   "when I had my baby"  . 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 Poisoning, snake bite 04/08/2016   "[place]; RUE"  . PONV (postoperative nausea and vomiting)   . Situational anxiety   . Staph infection     Patient Active Problem List   Diagnosis Date Noted  . Cellulitis of right upper extremity 04/11/2016  . Cellulitis 04/11/2016  . Snake bite   . Medical non-compliance 10/05/2015  . Elbow pain 07/20/2015  . Anxiety state 07/20/2015  . Wound infection (HCC) 07/04/2015  . Dehydration 07/04/2015  . Tachycardia 07/04/2015  . Diarrhea 07/04/2015  .  Wound infection after surgery 07/04/2015  . 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HCC)   . Cat bite of forearm 07/01/2015  . Skin ulcer of upper arm, limited to breakdown of skin (HCC) 07/01/2015  . Cellulitis of upper arm 03/10/2015    Past Surgical History:  Procedure Laterality Date  . APPENDECTOMY  ~ 1987  . APPLICATION OF A-CELL OF EXTREMITY Left 08/05/2015   Procedure: APPLICATION OF A-CELL OF EXTREMITY;  Surgeon: Peggye Formlaire S Dillingham, DO;  Location: [facility];  Service: Plastics;  Laterality: Left;  . BREAST SURGERY Right 1990   "mild duct taken out"  . DEBRIDEMENT AND CLOSURE WOUND Left 07/01/2015   Procedure: LEFT ELBOW EXCISION OF WOUND WITH PRIMARY CLOSURE 2X5 CM ;  Surgeon: Peggye Formlaire S Dillingham, DO;  Location: [facility];  Service: Plastics;  Laterality: Left;  . ELBOW SURGERY  X 23 in [place] <06/2015   from a cat bite; all I&D  . I&D EXTREMITY Left 07/08/2015   Procedure: IRRIGATION AND DEBRIDEMENT EXTREMITY, DRAINAGE OF LEFT ARM WOUND, A-CELL PLACEMENT, WOUND VAC PLACEMENT;  Surgeon: Alena Billslaire S Dillingham, DO;  Location: [facility];  Service: Plastics;  Laterality: Left;  . INCISION AND DRAINAGE OF WOUND Left 08/05/2015   Procedure: IRRIGATION AND DEBRIDEMENT LEFT ELBOW WOUND, PLACEMENT OF ACELL;  Surgeon: Peggye Formlaire S Dillingham, DO;  Location: [facility];  Service: Plastics;  Laterality: Left;  . LAPAROSCOPIC CHOLECYSTECTOMY  1998  . SKIN GRAFT  Left 2016   took from anterior thigh; placed at elbow  . TONSILLECTOMY  ~ 2000  . TOTAL ABDOMINAL HYSTERECTOMY  2003    OB History    No data available       Home Medications    Prior to Admission medications   Medication Sig Start Date End Date Taking? Authorizing Provider  Aspirin-Acetaminophen-Caffeine (GOODY HEADACHE PO) Take 1 packet by mouth daily as needed (for headache).    Historical Provider, MD  clonazePAM (KLONOPIN) 0.5 MG tablet Take 0.5 mg by mouth 2 (two) times daily as needed for  anxiety.  01/05/16   Historical Provider, MD  doxycycline (VIBRA-TABS) 100 MG tablet Take 1 tablet (100 mg total) by mouth 2 (two) times daily. X 7days 04/14/16   Ripudeep Jenna Luo, MD  gabapentin (NEURONTIN) 400 MG capsule Take 2 capsules (800 mg total) by mouth 3 (three) times daily. TAKE 2 CAPSULES BY MOUTH 3 TIMES DAILY 04/14/16   Ripudeep Jenna Luo, MD  mupirocin cream (BACTROBAN) 2 % Apply topically daily. Cleanse wounds to right finger with NS and pat gently dry.  Apply Bactroban to wound bed. Cover with 2x2 gauze and kerlix. Tape.  Change daily. 04/14/16   Ripudeep Jenna Luo, MD  oxyCODONE (OXY IR/ROXICODONE) 5 MG immediate release tablet Take 1 tablet (5 mg total) by mouth every 4 (four) hours as needed for severe pain. 04/14/16   Ripudeep Jenna Luo, MD  promethazine (PHENERGAN) 12.5 MG tablet Take 1 tablet (12.5 mg total) by mouth every 6 (six) hours as needed for nausea or vomiting. 04/14/16   Ripudeep Jenna Luo, MD    Family History Family History  Problem Relation Age of Onset  . Liver disease Mother   . Dementia Mother   . Cancer Mother   . Cancer Maternal Grandmother     Social History Social History  Substance Use Topics  . Smoking status: Never Smoker  . Smokeless tobacco: Never Used  . Alcohol use 0.0 oz/week     Comment: 04/11/2016 "I'll have a glass of wine q 2-3 months"     Allergies   Zofran [ondansetron hcl]; Morphine and related; Tessalon [benzonatate]; and Droperidol   Review of Systems Review of Systems  Constitutional: Negative for chills and fever.  HENT: Negative for facial swelling.   Respiratory: Negative for shortness of breath.   Cardiovascular: Negative for chest pain.  Gastrointestinal: Positive for nausea and vomiting. Negative for abdominal pain.  Genitourinary: Negative for dysuria.  Musculoskeletal: Negative for back pain.  Skin: Positive for wound. Negative for rash.  Neurological: Negative for headaches.  Psychiatric/Behavioral: The patient is not nervous/anxious.       Physical Exam Updated Vital Signs BP 110/64 (BP Location: Right Arm)   Pulse 86   Temp 99 F (37.2 C) (Oral)   Resp 19   SpO2 100%   Physical Exam  Constitutional: She appears well-developed and well-nourished. No distress.  HENT:  Head: Normocephalic and atraumatic.  Right Ear: Tympanic membrane normal.  Left Ear: Tympanic membrane normal.  Mouth/Throat: Oropharynx is clear and moist. No oropharyngeal exudate.  Eyes: Conjunctivae are normal. Pupils are equal, round, and reactive to light. Right eye exhibits no discharge. Left eye exhibits no discharge. No scleral icterus.  Neck: Normal range of motion. Neck supple. No thyromegaly present.  Cardiovascular: Normal rate, regular rhythm and normal heart sounds.  Exam reveals no gallop and no friction rub.   No murmur heard. Pulmonary/Chest: Effort normal and breath sounds normal. No stridor. No  respiratory distress. She has no wheezes. She has no rales.  Abdominal: Soft. Bowel sounds are normal. She exhibits no distension. There is no tenderness. There is no rebound and no guarding.  Musculoskeletal: She exhibits no edema.  Right index: Patient has difficulty with flexion at the PIP; flexion and extension at DIP intact; decreased abduction of second and third digits; patient able to thumb and finger opposition without difficulty; paresthesias to index finger above DIP; cap refill <2secs; first extension intact  Lymphadenopathy:    She has no cervical adenopathy.  Neurological: She is alert. Coordination normal.  Skin: Skin is warm and dry. No rash noted. She is not diaphoretic. No pallor.  Wound to right second digit- see image below  Psychiatric: She has a normal mood and affect.  Nursing note and vitals reviewed.        ED Treatments / Results  Labs (all labs ordered are listed, but only abnormal results are displayed) Labs Reviewed  COMPREHENSIVE METABOLIC PANEL  CBC WITH DIFFERENTIAL/PLATELET    EKG  EKG  Interpretation None       Radiology No results found.  Procedures Procedures (including critical care time)  Medications Ordered in ED Medications  morphine 4 MG/ML injection 4 mg (not administered)  promethazine (PHENERGAN) injection 12.5 mg (not administered)     Initial Impression / Assessment and Plan / ED Course  I have reviewed the triage vital signs and the nursing notes.  Pertinent labs & imaging results that were available during my care of the patient were reviewed by me and considered in my medical decision making (see chart for details).  Clinical Course    Nurses, IV team having difficulty placing IV. Resident will try. Patient given IM pain and antinausea medications and instead with some relief. Patient states her arm pain resolved, however she still has pain in her finger and MCP. I will order for more of morphine. I spoke with Dr. Daiva Eves with infectious disease who recommended a hand and wrist MRI To rule out osteomyelitis, cellulitis, tenosynovitis. If IV placement was successful, a contrasted study would be more beneficial according to radiology. If MRI negative, Dr. Algis Liming recommends that we treated the patient's pain and discharged with follow-up.  Final Clinical Impressions(s) / ED Diagnoses   Final diagnoses:  None    CBC shows hemoglobin 11.2 which is elevated from 5 days ago. CMP shows protein 6.1, total bilirubin 0.2. Lactic acid 0.87. Blood cultures pending. Right hand x-ray shows no acute osseous abnormalities; diffuse soft tissue swelling to the right hand with degenerative changes at the DIP joints. MRI pending. At shift change, patient care transferred to [facility] for continued evaluation, follow up of MRI and determination of disposition. Anticipate discharge if negative MRI and treat pain with follow-up. Burna Mortimer from social work will be seeing patient to arrange for outpatient follow-up.    New Prescriptions New Prescriptions   No  medications on file     Verdis Prime 04/18/16 2010    Rolland Porter, MD 04/26/16 1113

## 2016-04-18 NOTE — ED Triage Notes (Signed)
Tissue to RT index finger  Yellow and draining

## 2016-04-18 NOTE — Care Management (Signed)
ED CM met with patient at bedside concerning follow up. Patient was discharged from [facility] 8/3 for treatment of cellulitis of finger s/p snake bite a couple of weeks ago. Patient reports calling the clinic where her follow up was schedule to report the worsening of her finger and was told they could not see her until later in the month, and was told to come back to the hospital. EDP has evaluated and has consulted with ID for possible treatment plan.

## 2016-04-18 NOTE — ED Triage Notes (Signed)
PT reports N/V and wound odor on RT index finger.

## 2016-04-18 NOTE — ED Notes (Signed)
Pt. Taken to MRI.

## 2016-04-19 DIAGNOSIS — R202 Paresthesia of skin: Secondary | ICD-10-CM

## 2016-04-19 DIAGNOSIS — M79641 Pain in right hand: Secondary | ICD-10-CM

## 2016-04-19 DIAGNOSIS — S61231A Puncture wound without foreign body of left index finger without damage to nail, initial encounter: Secondary | ICD-10-CM

## 2016-04-19 DIAGNOSIS — M25439 Effusion, unspecified wrist: Secondary | ICD-10-CM

## 2016-04-19 DIAGNOSIS — L02519 Cutaneous abscess of unspecified hand: Secondary | ICD-10-CM

## 2016-04-19 MED ORDER — HYDROCODONE-ACETAMINOPHEN 5-325 MG PO TABS
1.0000 | ORAL_TABLET | ORAL | Status: DC | PRN
  Administered 2016-04-19: 2 via ORAL
  Administered 2016-04-19: 1 via ORAL
  Filled 2016-04-19: qty 2
  Filled 2016-04-19: qty 1

## 2016-04-19 MED ORDER — GABAPENTIN 400 MG PO CAPS
800.0000 mg | ORAL_CAPSULE | Freq: Three times a day (TID) | ORAL | Status: DC
  Administered 2016-04-19 – 2016-04-24 (×15): 800 mg via ORAL
  Filled 2016-04-19 (×14): qty 2

## 2016-04-19 MED ORDER — HYDROCODONE-ACETAMINOPHEN 5-325 MG PO TABS
1.0000 | ORAL_TABLET | ORAL | Status: DC | PRN
  Administered 2016-04-20 – 2016-04-21 (×3): 2 via ORAL
  Filled 2016-04-19 (×3): qty 2

## 2016-04-19 MED ORDER — SODIUM CHLORIDE 0.9 % IV BOLUS (SEPSIS)
500.0000 mL | Freq: Once | INTRAVENOUS | Status: AC
  Administered 2016-04-19: 500 mL via INTRAVENOUS

## 2016-04-19 MED ORDER — LEVOFLOXACIN 750 MG PO TABS
750.0000 mg | ORAL_TABLET | Freq: Every day | ORAL | Status: DC
  Administered 2016-04-19 – 2016-04-24 (×6): 750 mg via ORAL
  Filled 2016-04-19 (×6): qty 1

## 2016-04-19 MED ORDER — PROMETHAZINE HCL 25 MG/ML IJ SOLN
12.5000 mg | Freq: Four times a day (QID) | INTRAMUSCULAR | Status: DC | PRN
  Administered 2016-04-19 – 2016-04-21 (×8): 12.5 mg via INTRAVENOUS
  Filled 2016-04-19 (×8): qty 1

## 2016-04-19 MED ORDER — MORPHINE SULFATE (PF) 2 MG/ML IV SOLN
1.0000 mg | INTRAVENOUS | Status: DC | PRN
  Administered 2016-04-19 – 2016-04-20 (×7): 1 mg via INTRAVENOUS
  Filled 2016-04-19 (×7): qty 1

## 2016-04-19 MED ORDER — FENTANYL CITRATE (PF) 100 MCG/2ML IJ SOLN
25.0000 ug | Freq: Once | INTRAMUSCULAR | Status: AC
  Administered 2016-04-19: 25 ug via INTRAVENOUS
  Filled 2016-04-19: qty 2

## 2016-04-19 MED ORDER — CLONAZEPAM 0.5 MG PO TABS
0.5000 mg | ORAL_TABLET | Freq: Two times a day (BID) | ORAL | Status: DC | PRN
  Administered 2016-04-19 – 2016-04-24 (×11): 0.5 mg via ORAL
  Filled 2016-04-19 (×11): qty 1

## 2016-04-19 MED ORDER — GABAPENTIN 800 MG PO TABS
800.0000 mg | ORAL_TABLET | Freq: Three times a day (TID) | ORAL | Status: DC
  Filled 2016-04-19: qty 1

## 2016-04-19 NOTE — H&P (Addendum)
History and Physical    NEEYA PRIGMORE WUJ:[id number] DOB: 04/19/1966 DOA: 04/18/2016   PCP: [name], NP Chief Complaint:  Chief Complaint  Patient presents with  . Emesis  . Wound Infection    HPI: Sabrina [name] is a 50 y.o. female with medical history significant of recent snake bite of finger 2 weeks ago, subsequent cellulitis and admission for this on 7/31, discharged on 8/3 on doxycycline.  Patient returns to ED with worsening erythema, pain, and cellulitis of finger despite ABx.  ED Course: MRI prelim result suggests reactive marrow changes vs possible early osteo.  ID and Hand consulted.  Review of Systems: As per HPI otherwise 10 point review of systems negative.    Past Medical History:  Diagnosis Date  . Arthritis   . Bullous pemphigus   . Cat bite 06/2014   to left elbow  . History of blood transfusion 1988   "when I had my baby"  . 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 Poisoning, snake bite 04/08/2016   "[place]; RUE"  . PONV (postoperative nausea and vomiting)   . Situational anxiety   . Staph infection     Past Surgical History:  Procedure Laterality Date  . APPENDECTOMY  ~ 1987  . APPLICATION OF A-CELL OF EXTREMITY Left 08/05/2015   Procedure: APPLICATION OF A-CELL OF EXTREMITY;  Surgeon: Peggye Form, DO;  Location: [facility];  Service: Plastics;  Laterality: Left;  . BREAST SURGERY Right 1990   "mild duct taken out"  . DEBRIDEMENT AND CLOSURE WOUND Left 07/01/2015   Procedure: LEFT ELBOW EXCISION OF WOUND WITH PRIMARY CLOSURE 2X5 CM ;  Surgeon: Peggye Form, DO;  Location: [facility];  Service: Plastics;  Laterality: Left;  . ELBOW SURGERY  X 23 in [place] <06/2015   from a cat bite; all I&D  . I&D EXTREMITY Left 07/08/2015   Procedure: IRRIGATION AND DEBRIDEMENT EXTREMITY, DRAINAGE OF LEFT ARM WOUND, A-CELL PLACEMENT, WOUND VAC PLACEMENT;  Surgeon: Alena Bills Dillingham, DO;  Location: [facility];  Service:  Plastics;  Laterality: Left;  . INCISION AND DRAINAGE OF WOUND Left 08/05/2015   Procedure: IRRIGATION AND DEBRIDEMENT LEFT ELBOW WOUND, PLACEMENT OF ACELL;  Surgeon: Peggye Form, DO;  Location: [facility];  Service: Plastics;  Laterality: Left;  . LAPAROSCOPIC CHOLECYSTECTOMY  1998  . SKIN GRAFT Left 2016   took from anterior thigh; placed at elbow  . TONSILLECTOMY  ~ 2000  . TOTAL ABDOMINAL HYSTERECTOMY  2003     reports that she has never smoked. She has never used smokeless tobacco. She reports that she drinks alcohol. She reports that she does not use drugs.  Allergies  Allergen Reactions  . Zofran [Ondansetron Hcl] Hives and Other (See Comments)    Spots around iv site  . Droperidol Palpitations  . Morphine And Related Itching  . Tessalon [Benzonatate] Other (See Comments)    Watery eyes    Family History  Problem Relation Age of Onset  . Liver disease Mother   . Dementia Mother   . Cancer Mother   . Cancer Maternal Grandmother       Prior to Admission medications   Medication Sig Start Date End Date Taking? Authorizing Provider  Aspirin-Acetaminophen-Caffeine (GOODY HEADACHE PO) Take 1 packet by mouth daily as needed (for headache).   Yes Historical Provider, MD  clonazePAM (KLONOPIN) 0.5 MG tablet Take 0.5 mg by mouth 2 (two) times daily as needed for anxiety.  01/05/16  Yes Historical Provider, MD  gabapentin (NEURONTIN) 400 MG capsule Take 2 capsules (800 mg total) by mouth 3 (three) times daily. TAKE 2 CAPSULES BY MOUTH 3 TIMES DAILY 04/14/16  Yes Ripudeep Jenna Luo, MD  mupirocin cream (BACTROBAN) 2 % Apply topically daily. Cleanse wounds to right finger with NS and pat gently dry.  Apply Bactroban to wound bed. Cover with 2x2 gauze and kerlix. Tape.  Change daily. 04/14/16  Yes Ripudeep Jenna Luo, MD  oxyCODONE (OXY IR/ROXICODONE) 5 MG immediate release tablet Take 1 tablet (5 mg total) by mouth every 4 (four) hours as needed for severe pain. 04/14/16  Yes  Ripudeep Jenna Luo, MD  promethazine (PHENERGAN) 12.5 MG tablet Take 1 tablet (12.5 mg total) by mouth every 6 (six) hours as needed for nausea or vomiting. 04/14/16  Yes Ripudeep Jenna Luo, MD  doxycycline (VIBRA-TABS) 100 MG tablet Take 1 tablet (100 mg total) by mouth 2 (two) times daily. X 7days Patient not taking: Reported on 04/18/2016 04/14/16   Ripudeep Jenna Luo, MD    Physical Exam: Vitals:   04/18/16 2330 04/19/16 0000 04/19/16 0030 04/19/16 0101  BP: 92/77 93/61 92/65  94/65  Pulse: 70 71 69 63  Resp:      Temp:      TempSrc:      SpO2: 96% 91% 96% 100%  Weight:      Height:          Constitutional: NAD, calm, comfortable Eyes: PERRL, lids and conjunctivae normal ENMT: Mucous membranes are moist. Posterior pharynx clear of any exudate or lesions.Normal dentition.  Neck: normal, supple, no masses, no thyromegaly Respiratory: clear to auscultation bilaterally, no wheezing, no crackles. Normal respiratory effort. No accessory muscle use.  Cardiovascular: Regular rate and rhythm, no murmurs / rubs / gallops. No extremity edema. 2+ pedal pulses. No carotid bruits.  Abdomen: no tenderness, no masses palpated. No hepatosplenomegaly. Bowel sounds positive.  Musculoskeletal: no clubbing / cyanosis. No joint deformity upper and lower extremities. Good ROM, no contractures. Normal muscle tone.  Skin:    Neurologic: CN 2-12 grossly intact. Sensation intact, DTR normal. Strength 5/5 in all 4.  Psychiatric: Normal judgment and insight. Alert and oriented x 3. Normal mood.    Labs on Admission: I have personally reviewed following labs and imaging studies  CBC:  Recent Labs Lab 04/12/16 0514 04/13/16 0525 04/18/16 1215  WBC 5.1 3.7* 4.4  NEUTROABS  --   --  2.0  HGB 9.6* 10.8* 11.2*  HCT 30.7* 34.3* 34.9*  MCV 96.8 95.8 94.8  PLT 208 202 177   Basic Metabolic Panel:  Recent Labs Lab 04/12/16 0514 04/13/16 0525 04/18/16 1215  NA 139 141 140  K 3.6 4.1 3.5  CL 110 110 107  CO2  26 25 27   GLUCOSE 87 89 95  BUN 17 13 16   CREATININE 0.94 0.92 0.83  CALCIUM 8.2* 8.8* 9.0   GFR: Estimated Creatinine Clearance: 87.2 mL/min (by C-G formula based on SCr of 0.83 mg/dL). Liver Function Tests:  Recent Labs Lab 04/18/16 1215  AST 24  ALT 17  ALKPHOS 79  BILITOT 0.2*  PROT 6.1*  ALBUMIN 3.6   No results for input(s): LIPASE, AMYLASE in the last 168 hours. No results for input(s): AMMONIA in the last 168 hours. Coagulation Profile: No results for input(s): INR, PROTIME in the last 168 hours. Cardiac Enzymes: No results for input(s): CKTOTAL, CKMB, CKMBINDEX, TROPONINI in the last 168 hours. BNP (last 3 results) No results  for input(s): PROBNP in the last 8760 hours. HbA1C: No results for input(s): HGBA1C in the last 72 hours. CBG: No results for input(s): GLUCAP in the last 168 hours. Lipid Profile: No results for input(s): CHOL, HDL, LDLCALC, TRIG, CHOLHDL, LDLDIRECT in the last 72 hours. Thyroid Function Tests: No results for input(s): TSH, T4TOTAL, FREET4, T3FREE, THYROIDAB in the last 72 hours. Anemia Panel: No results for input(s): VITAMINB12, FOLATE, FERRITIN, TIBC, IRON, RETICCTPCT in the last 72 hours. Urine analysis:    Component Value Date/Time   COLORURINE YELLOW 04/12/2016 0213   APPEARANCEUR CLEAR 04/12/2016 0213   LABSPEC 1.019 04/12/2016 0213   PHURINE 5.5 04/12/2016 0213   GLUCOSEU NEGATIVE 04/12/2016 0213   HGBUR NEGATIVE 04/12/2016 0213   BILIRUBINUR NEGATIVE 04/12/2016 0213   KETONESUR NEGATIVE 04/12/2016 0213   PROTEINUR NEGATIVE 04/12/2016 0213   UROBILINOGEN 0.2 07/05/2015 0627   NITRITE NEGATIVE 04/12/2016 0213   LEUKOCYTESUR SMALL (A) 04/12/2016 0213   Sepsis Labs: @LABRCNTIP (procalcitonin:4,lacticidven:4) ) Recent Results (from the past 240 hour(s))  Culture, blood (Routine x 2)     Status: None   Collection Time: 04/11/16  5:28 PM  Result Value Ref Range Status   Specimen Description BLOOD LEFT HAND  Final   Special  Requests BOTTLES DRAWN AEROBIC AND ANAEROBIC 5CC  Final   Culture NO GROWTH 5 DAYS  Final   Report Status 04/16/2016 FINAL  Final  Urine culture     Status: None   Collection Time: 04/11/16  7:32 PM  Result Value Ref Range Status   Specimen Description URINE, RANDOM  Final   Special Requests NONE  Final   Culture NO GROWTH  Final   Report Status 04/13/2016 FINAL  Final  Culture, blood (Routine x 2)     Status: None   Collection Time: 04/11/16  8:07 PM  Result Value Ref Range Status   Specimen Description BLOOD LEFT HAND  Final   Special Requests IN PEDIATRIC BOTTLE 1CC  Final   Culture NO GROWTH 5 DAYS  Final   Report Status 04/16/2016 FINAL  Final  Urine culture     Status: None   Collection Time: 04/12/16  2:13 AM  Result Value Ref Range Status   Specimen Description URINE, RANDOM  Final   Special Requests NONE  Final   Culture NO GROWTH  Final   Report Status 04/13/2016 FINAL  Final     Radiological Exams on Admission: Dg Hand Complete Right  Result Date: 04/18/2016 CLINICAL DATA:  Snake bite to posterior index finger 2 weeks ago, still has an open wound, question osteomyelitis EXAM: RIGHT HAND - COMPLETE 3+ VIEW COMPARISON:  None FINDINGS: Mild diffuse osseous demineralization questioned. Scattered soft tissue swelling throughout RIGHT hand. Degenerative changes at scattered DIP joints. No acute fracture, dislocation or bone destruction. IMPRESSION: No acute osseous abnormalities. Diffuse soft tissue swelling RIGHT hand with degenerative changes at DIP joints. Electronically Signed   By: Ulyses SouthwardMark  Boles M.D.   On: 04/18/2016 11:42    EKG: Independently reviewed.  Assessment/Plan Principal Problem:   Wound infection (HCC) Active Problems:   Snake bite   Snake bite and cellulitis of wound, possibly osteo -  ID consulted they want:   BCx (ordered)   Patient admitted   No additional abx ordered for now, they will see in AM and recommend ABX at that time (done)  Hand consulted  and will see in AM   Will keep patient NPO in case they want to do surgery   Will  use SCDs only for DVT ppx, in case they want to do surgery.   DVT prophylaxis: SCDs only Code Status: Full Family Communication: No family in room Consults called: ID and hand (Dr. Daiva Eves and Dr. Melvyn Novas) Admission status: Admit to inpatient, failed outpatient treatment   Hillary Bow DO [facility] Pager [phone] from 7PM-7AM  If 7AM-7PM, please contact the day physician for the patient [email or web address] Password [email or web address]  04/19/2016, 2:28 AM

## 2016-04-19 NOTE — Consult Note (Addendum)
WOC consult requested for finger; full thickness wound related to snakebite on 7/28, according to the EMR.  Photos of the wound were reviewed online and have significant amount of slough. Hand surgeon has been consulted for assessment and plan of care. Moist gauze dressing orders provided for the bedside nurses until further input available from their team; please refer to them for further questions. Please re-consult if further assistance is needed.  Thank-you,  Cammie Mcgeeawn Breylin Dom MSN, RN, CWOCN, [facility]-AP, CNS [phone]

## 2016-04-19 NOTE — ED Notes (Addendum)
Pt was admitted to hosp a few weeks ago because of a snake bite to the left index finger. Pt was discharged and placed on abx and was getting better. Few days ago pt finger got more swollen and painful. Came in for evaluation and being admitted for management. Pt has had MRI with no official results. Pt is A+O x4 and a pleasant lady. Pts blood pressure has been soft entire visit and doesn't dip on administration of pain medication

## 2016-04-19 NOTE — Consult Note (Signed)
Date of Admission:  04/18/2016  Date of Consult:  04/19/2016  Reason for Consult: worsening wound at site of possible snake bite Referring Physician: Dr. Cathlean Sauer  HPI: Sabrina [name] is an 50 y.o. 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 Apparently she had been trying to retrieve a cat behind some bushes and put her hand in the bushes. She then noticed that she was bleeding from 2 marks that were present. She had these sites looked at which were also is an swelling and numbness by the Veterinarian  and with whom she works and that [employer] thought this was a snake bite.   Patient sought care for this in the emergency department [place] on July 28 and she was prescribed oral Augmentin apparently despite the Augmentin she had worsening blistering around the wound site and she was admitted to the hospitalist service on July 31. At that time she was treated with broad spectrum antibiotics including vancomycin and Zosyn. Her wound improved and I was called with regards to recommendations for oral antibiotics. I recall wanting to send her out on something that would cover strep species and possibly pseudomonas but patient was sent out on doxycyline. While on doxycycline her wound has worsened additionally she has had pain and numbness that is radiating from her hand up into her arm. She was in the emerge department and I was called about her case I recommend getting an MRI to look for deep infection such as a bone infection.  MRI showed:  1. Inflammatory changes within the subcutaneous tissues of the index finger most consistent with cellulitis. Possible phlegmon dorsal to the proximal interphalangeal joint of the index finger. No focal abscess or tenosynovitis demonstrated. 2. No specific signs of  osteomyelitis identified. There is possible low-level edema within the base of the second distal phalanx, suboptimally evaluated due to incomplete fat saturation. This could be due to underlying osteoarthritis which is most advanced at the second DIP joint.  I was called around 1-2 am and informed the patient had changes c/w osteomyelitis and I recommended that the patient be admitted and seen by Ortho-hand surgery today. Instead ortho-hand was called last night. The hand surgeon called me this am. He feels that the rings that were called possible osteomyelitis on the initial read were instead due to osteoarthritic changes. He saw no need for surgical intervention. He instead wanted the patient to be seen as an outpatient in his clinic.  Upon seeing the patient a learning that no surgical intervention was on be performed to obtain a deep culture I decided start her on an antibiotic with anti-pseudomonal and streptococcal coverage namely levofloxacin at 750 mg also thought wound care should be consulted.  The patient could be seen in the orthopedic hand surgeons clinic but she lacks insurance which will assuredly lead to a high co-pay at the door. It will be more prudent to observe her in the hospital and if she is not improving ask orthopantogram and see her in person.   Past Medical History:  Diagnosis Date  . Arthritis   . Bullous pemphigus   . Cat bite 06/2014   to left elbow  . History of blood transfusion 1988   "when I had my baby"  . 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[place])   . Poisoning,  snake bite 04/08/2016   "[place]; RUE"  . PONV (postoperative nausea and vomiting)   . Situational anxiety   . Staph infection     Past Surgical History:  Procedure Laterality Date  . APPENDECTOMY  ~ 1987  . APPLICATION OF A-CELL OF EXTREMITY Left 08/05/2015   Procedure: APPLICATION OF A-CELL OF EXTREMITY;  Surgeon: Wallace Going, DO;  Location: [facility];  Service: Plastics;   Laterality: Left;  . BREAST SURGERY Right 1990   "mild duct taken out"  . DEBRIDEMENT AND CLOSURE WOUND Left 07/01/2015   Procedure: LEFT ELBOW EXCISION OF WOUND WITH PRIMARY CLOSURE 2X5 CM ;  Surgeon: Wallace Going, DO;  Location: [facility];  Service: Plastics;  Laterality: Left;  . ELBOW SURGERY  X 23 in [place] <06/2015   from a cat bite; all I&D  . I&D EXTREMITY Left 07/08/2015   Procedure: IRRIGATION AND DEBRIDEMENT EXTREMITY, DRAINAGE OF LEFT ARM WOUND, A-CELL PLACEMENT, WOUND VAC PLACEMENT;  Surgeon: Loel Lofty Dillingham, DO;  Location: [facility];  Service: Plastics;  Laterality: Left;  . INCISION AND DRAINAGE OF WOUND Left 08/05/2015   Procedure: IRRIGATION AND DEBRIDEMENT LEFT ELBOW WOUND, PLACEMENT OF ACELL;  Surgeon: Wallace Going, DO;  Location: [facility];  Service: Plastics;  Laterality: Left;  . LAPAROSCOPIC CHOLECYSTECTOMY  1998  . SKIN GRAFT Left 2016   took from anterior thigh; placed at elbow  . TONSILLECTOMY  ~ 2000  . TOTAL ABDOMINAL HYSTERECTOMY  2003    Social History:  reports that she has never smoked. She has never used smokeless tobacco. She reports that she drinks alcohol. She reports that she does not use drugs.   Family History  Problem Relation Age of Onset  . Liver disease Mother   . Dementia Mother   . Cancer Mother   . Cancer Maternal Grandmother     Allergies  Allergen Reactions  . Zofran [Ondansetron Hcl] Hives and Other (See Comments)    Spots around iv site  . Droperidol Palpitations  . Morphine And Related Itching  . Tessalon [Benzonatate] Other (See Comments)    Watery eyes     Medications: I have reviewed patients current medications as documented in Epic Anti-infectives    Start     Dose/Rate Route Frequency Ordered Stop   04/19/16 1100  levofloxacin (LEVAQUIN) tablet 750 mg     750 mg Oral Daily 04/19/16 0957           ROS: as in HPI otherwise remainder of 12 point Review of Systems is  negative    Blood pressure (!) 97/57, pulse 70, temperature 98 F (36.7 C), temperature source Oral, resp. rate 18, height '5\' 4"'$  (1.626 m), weight 184 lb 1.4 oz (83.5 kg), SpO2 98 %. General: Alert and awake, oriented x3, not in any acute distress. HEENT: anicteric sclera,  EOMI, oropharynx clear and without exudate Cardiovascular: egular rate, normal r,  no murmur rubs or gallops Pulmonary: clear to auscultation bilaterally, no wheezing, rales or rhonchi Gastrointestinal: soft nontender, nondistended, normal bowel sounds, Musculoskeletal: no  clubbing or edema noted bilaterally Skin, soft tissue:  Wound:      04/19/16:            Neuro: nonfocal, strength and sensation intact   Results for orders placed or performed during the hospital encounter of 04/18/16 (from the past 48 hour(s))  Culture, blood (Routine X 2) w Reflex to ID Panel     Status: None (Preliminary  result)   Collection Time: 04/18/16 12:10 PM  Result Value Ref Range   Specimen Description BLOOD RIGHT ANTECUBITAL    Special Requests      BOTTLES DRAWN AEROBIC AND ANAEROBIC 10CC AER 5CC ANA   Culture NO GROWTH 1 DAY    Report Status PENDING   Comprehensive metabolic panel     Status: Abnormal   Collection Time: 04/18/16 12:15 PM  Result Value Ref Range   Sodium 140 135 - 145 mmol/L   Potassium 3.5 3.5 - 5.1 mmol/L   Chloride 107 101 - 111 mmol/L   CO2 27 22 - 32 mmol/L   Glucose, Bld 95 65 - 99 mg/dL   BUN 16 6 - 20 mg/dL   Creatinine, Ser 0.83 0.44 - 1.00 mg/dL   Calcium 9.0 8.9 - 10.3 mg/dL   Total Protein 6.1 (L) 6.5 - 8.1 g/dL   Albumin 3.6 3.5 - 5.0 g/dL   AST 24 15 - 41 U/L   ALT 17 14 - 54 U/L   Alkaline Phosphatase 79 38 - 126 U/L   Total Bilirubin 0.2 (L) 0.3 - 1.2 mg/dL   GFR calc non Af Amer >60 >60 mL/min   GFR calc Af Amer >60 >60 mL/min    Comment: (NOTE) The eGFR has been calculated using the CKD EPI equation. This calculation has not been validated in all clinical  situations. eGFR's persistently <60 mL/min signify possible Chronic Kidney Disease.    Anion gap 6 5 - 15  CBC with Differential     Status: Abnormal   Collection Time: 04/18/16 12:15 PM  Result Value Ref Range   WBC 4.4 4.0 - 10.5 K/uL   RBC 3.68 (L) 3.87 - 5.11 MIL/uL   Hemoglobin 11.2 (L) 12.0 - 15.0 g/dL   HCT 34.9 (L) 36.0 - 46.0 %   MCV 94.8 78.0 - 100.0 fL   MCH 30.4 26.0 - 34.0 pg   MCHC 32.1 30.0 - 36.0 g/dL   RDW 13.5 11.5 - 15.5 %   Platelets 177 150 - 400 K/uL   Neutrophils Relative % 45 %   Neutro Abs 2.0 1.7 - 7.7 K/uL   Lymphocytes Relative 41 %   Lymphs Abs 1.8 0.7 - 4.0 K/uL   Monocytes Relative 10 %   Monocytes Absolute 0.4 0.1 - 1.0 K/uL   Eosinophils Relative 4 %   Eosinophils Absolute 0.2 0.0 - 0.7 K/uL   Basophils Relative 0 %   Basophils Absolute 0.0 0.0 - 0.1 K/uL  Culture, blood (Routine X 2) w Reflex to ID Panel     Status: None (Preliminary result)   Collection Time: 04/18/16 12:31 PM  Result Value Ref Range   Specimen Description BLOOD RIGHT HAND    Special Requests BOTTLES DRAWN AEROBIC ONLY 10CC    Culture NO GROWTH 1 DAY    Report Status PENDING   I-Stat CG4 Lactic Acid, ED     Status: None   Collection Time: 04/18/16 12:40 PM  Result Value Ref Range   Lactic Acid, Venous 0.87 0.5 - 1.9 mmol/L   '@BRIEFLABTABLE'$ (sdes,specrequest,cult,reptstatus)   ) Recent Results (from the past 720 hour(s))  Culture, blood (Routine x 2)     Status: None   Collection Time: 04/11/16  5:28 PM  Result Value Ref Range Status   Specimen Description BLOOD LEFT HAND  Final   Special Requests BOTTLES DRAWN AEROBIC AND ANAEROBIC 5CC  Final   Culture NO GROWTH 5 DAYS  Final   Report Status  04/16/2016 FINAL  Final  Urine culture     Status: None   Collection Time: 04/11/16  7:32 PM  Result Value Ref Range Status   Specimen Description URINE, RANDOM  Final   Special Requests NONE  Final   Culture NO GROWTH  Final   Report Status 04/13/2016 FINAL  Final    Culture, blood (Routine x 2)     Status: None   Collection Time: 04/11/16  8:07 PM  Result Value Ref Range Status   Specimen Description BLOOD LEFT HAND  Final   Special Requests IN PEDIATRIC BOTTLE [place]  Final   Culture NO GROWTH 5 DAYS  Final   Report Status 04/16/2016 FINAL  Final  Urine culture     Status: None   Collection Time: 04/12/16  2:13 AM  Result Value Ref Range Status   Specimen Description URINE, RANDOM  Final   Special Requests NONE  Final   Culture NO GROWTH  Final   Report Status 04/13/2016 FINAL  Final  Culture, blood (Routine X 2) w Reflex to ID Panel     Status: None (Preliminary result)   Collection Time: 04/18/16 12:10 PM  Result Value Ref Range Status   Specimen Description BLOOD RIGHT ANTECUBITAL  Final   Special Requests   Final    BOTTLES DRAWN AEROBIC AND ANAEROBIC 10CC AER 5CC ANA   Culture NO GROWTH 1 DAY  Final   Report Status PENDING  Incomplete  Culture, blood (Routine X 2) w Reflex to ID Panel     Status: None (Preliminary result)   Collection Time: 04/18/16 12:31 PM  Result Value Ref Range Status   Specimen Description BLOOD RIGHT HAND  Final   Special Requests BOTTLES DRAWN AEROBIC ONLY 10CC  Final   Culture NO GROWTH 1 DAY  Final   Report Status PENDING  Incomplete     Impression/Recommendation  Principal Problem:   Wound infection ([place]) Active Problems:   Snake bite   Sabrina [name] is a 50 y.o. female with puncture wound to finger lead to be ischemic snake bite which did not respond to less than a day of Augmentin but did respond to vancomycin and Zosyn and then sent home with doxycycline with worsening of her wound.  #1 Worsening wound after puncture potentially snake bite:  Concern is for uncontrolled infection at the site  Fortunately she does NOT have osteomyelitis by imaging but she DOES have phlegmonous changes and this area MAY indeed still need attention by a hand surgeon  I have added back streptococcal coverage and  anti-pseudomonal coverage with levofloxacin 750 mg. If she does better on this she gradually go home on this oral antibiotic  I have put in consult for [place]  Would watch closely and if not improving ask that Ortho hand come in and see the patient in the hospital especially since she has NO insurance and will be very high risk for yet another re-admission  We should also consider that this might be a non-id phenomena such as Pyoderma gangrenosum  (latter would need biopsy to help assert)  Story sounds better for infection though it has assisted for several weeks. If it is infection I have anxiety that the patient really does need some type of surgical debridement performed even if it is only of soft tissue and not of bone  #2 Screening; check HIV, HCV, hep B  04/19/2016, 4:39 PM   Thank you so much for this interesting consult  [place] [facility]  Infectious Disease [facility] [phone] (pager) [phone] (office) 04/19/2016, 4:39 PM  Rhina Br[place] 04/19/2016, 4:39 PM

## 2016-04-19 NOTE — ED Provider Notes (Signed)
Pt signed out to me pending MRI hand. Preliminary report results reveal soft tissue edema of index finger. Psseous edema and enhancement in the distal tuft, possible minimal T1 signal change. Reactive/minimal early osteomyelitis. More confluent soft tissue changes at index finger MCP dorsal subcutaneous tissues. No abscess.   Spoke with Dr. Algis LimingVandam who recommends hand consult and medical admission as pt likely will need surgical intervention. Dr. Algis LimingVandam does not feel that further antibiotics are indicated.  Spoke with Dr. Mora Bellmanni who feels that medical admission is warranted. Hand may be consulted in the morning as surgery would likely not be performed tonight.  Spoke with Dr. Julian ReilGardner with hospitalist service who does not feel that medicine has a role given that no abx are going to be given. Dr. Julian ReilGardner would like me to consult hand for admission.  Hand consult paged out.  Dr. Julian ReilGardner called me back, pt is actually a bounce back from 4 days ago. Dr. Julian ReilGardner will place admission orders.   Spoke with Dr. Melvyn Novasrtmann who will see pt tomorrow.    Lester KinsmanSamantha Tripp PaullinaDowless, PA-C 04/19/16 2136    Tomasita CrumbleAdeleke Oni, MD 04/22/16 [phone]

## 2016-04-19 NOTE — Progress Notes (Signed)
Patient has been receiving morphine. Asked patient about allergy in chart "morphine and related." States that this is old information, she is okay to take. Will continue to monitor.   Quita SkyeMorgan P Dishmon, RN

## 2016-04-19 NOTE — Progress Notes (Signed)
PROGRESS NOTE    Sabrina [name]  ZOX:[id number] DOB: 03/20/1966 DOA: 04/18/2016 PCP: [name], NP    Brief Narrative:  Right hand, index finger cellulitis possible osteomyelitis. 50 yo female was admitted July 31 for cellulitis on right hand index cellulitis, discharged with PO antibiotics, returned with worsening symptoms. ID and hand surgery consulted.   Assessment & Plan:   Principal Problem:   Wound infection (HCC) Active Problems:   Snake bite   1. Cellulitis/ osteomyelitis. Patient failed outpatient therapy. Will continue levofloxacin per I.D recommendations, follow with hand surgery. MRI with possible phlegmon dorsal to the proximal interphalangeal joint of the index finger. Continue local wound care and pain, control. Will add IV morphine for severe pain. Blood cultures pending.   2.  Anxiety. Stable, no agitation or confusion, will resume clonazepam and gabapentin per patient's home regimen.  3. Bullous pemphigus. Stable, follow as outpatient.    DVT prophylaxis:  scd Code Status: full  Family Communication: no family at bedside Disposition Plan: home  Consultants:   I.D  Hand Surgery  Procedures:   Antimicrobials:  levofloxacin  Subjective: Patient with significant pain on index finger on right hand, radiated to the forearm, sharp in nature, 10/10 intensity, with no worsening or improving factors. Positive nausea but no vomiting. Patient requesting home dose of clonazepam and gabapentin.   Objective: Vitals:   04/19/16 0315 04/19/16 0349 04/19/16 0626 04/19/16 0932  BP: 96/61 (!) 92/53 93/68 102/62  Pulse: 66 70 68 73  Resp:  16 16 18   Temp:  98.1 F (36.7 C) 98.1 F (36.7 C) 98.4 F (36.9 C)  TempSrc:  Oral Oral Oral  SpO2: 95% 98% 98% 99%  Weight:  83.5 kg (184 lb 1.4 oz)    Height:  5\' 4"  (1.626 m)     No intake or output data in the 24 hours ending 04/19/16 1150 Filed Weights   04/18/16 1436 04/19/16 0349  Weight: 82.6 kg (182 lb)  83.5 kg (184 lb 1.4 oz)    Examination:  General exam: Not in dyspnea, positive pain. E ENT: no conjunctival pallor, oral mucosa moist Respiratory system: Clear to auscultation. Respiratory effort normal. No wheezing, rales or rhonchi. Cardiovascular system: S1 & S2 heard, RRR. No JVD, murmurs, rubs, gallops or clicks. No pedal edema. Gastrointestinal system: Abdomen is nondistended, soft and nontender. No organomegaly or masses felt. Normal bowel sounds heard. Central nervous system: Alert and oriented. No focal neurological deficits. Extremities: Symmetric 5 x 5 power. Skin: Right hand, index finger, with ulcerated lesion 3 by 2 cm, located on the proximal lateral region, irregular borders, clean base, no significant purulence.  Psychiatry: Judgement and insight appear normal. Mood & affect appropriate.     Data Reviewed: I have personally reviewed following labs and imaging studies  CBC:  Recent Labs Lab 04/13/16 0525 04/18/16 1215  WBC 3.7* 4.4  NEUTROABS  --  2.0  HGB 10.8* 11.2*  HCT 34.3* 34.9*  MCV 95.8 94.8  PLT 202 177   Basic Metabolic Panel:  Recent Labs Lab 04/13/16 0525 04/18/16 1215  NA 141 140  K 4.1 3.5  CL 110 107  CO2 25 27  GLUCOSE 89 95  BUN 13 16  CREATININE 0.92 0.83  CALCIUM 8.8* 9.0   GFR: Estimated Creatinine Clearance: 87.6 mL/min (by C-G formula based on SCr of 0.83 mg/dL). Liver Function Tests:  Recent Labs Lab 04/18/16 1215  AST 24  ALT 17  ALKPHOS 79  BILITOT 0.2*  PROT  6.1*  ALBUMIN 3.6   No results for input(s): LIPASE, AMYLASE in the last 168 hours. No results for input(s): AMMONIA in the last 168 hours. Coagulation Profile: No results for input(s): INR, PROTIME in the last 168 hours. Cardiac Enzymes: No results for input(s): CKTOTAL, CKMB, CKMBINDEX, TROPONINI in the last 168 hours. BNP (last 3 results) No results for input(s): PROBNP in the last 8760 hours. HbA1C: No results for input(s): HGBA1C in the last 72  hours. CBG: No results for input(s): GLUCAP in the last 168 hours. Lipid Profile: No results for input(s): CHOL, HDL, LDLCALC, TRIG, CHOLHDL, LDLDIRECT in the last 72 hours. Thyroid Function Tests: No results for input(s): TSH, T4TOTAL, FREET4, T3FREE, THYROIDAB in the last 72 hours. Anemia Panel: No results for input(s): VITAMINB12, FOLATE, FERRITIN, TIBC, IRON, RETICCTPCT in the last 72 hours. Sepsis Labs:  Recent Labs Lab 04/18/16 1240  LATICACIDVEN 0.87    Recent Results (from the past 240 hour(s))  Culture, blood (Routine x 2)     Status: None   Collection Time: 04/11/16  5:28 PM  Result Value Ref Range Status   Specimen Description BLOOD LEFT HAND  Final   Special Requests BOTTLES DRAWN AEROBIC AND ANAEROBIC 5CC  Final   Culture NO GROWTH 5 DAYS  Final   Report Status 04/16/2016 FINAL  Final  Urine culture     Status: None   Collection Time: 04/11/16  7:32 PM  Result Value Ref Range Status   Specimen Description URINE, RANDOM  Final   Special Requests NONE  Final   Culture NO GROWTH  Final   Report Status 04/13/2016 FINAL  Final  Culture, blood (Routine x 2)     Status: None   Collection Time: 04/11/16  8:07 PM  Result Value Ref Range Status   Specimen Description BLOOD LEFT HAND  Final   Special Requests IN PEDIATRIC BOTTLE 1CC  Final   Culture NO GROWTH 5 DAYS  Final   Report Status 04/16/2016 FINAL  Final  Urine culture     Status: None   Collection Time: 04/12/16  2:13 AM  Result Value Ref Range Status   Specimen Description URINE, RANDOM  Final   Special Requests NONE  Final   Culture NO GROWTH  Final   Report Status 04/13/2016 FINAL  Final  Culture, blood (Routine X 2) w Reflex to ID Panel     Status: None (Preliminary result)   Collection Time: 04/18/16 12:10 PM  Result Value Ref Range Status   Specimen Description BLOOD RIGHT ANTECUBITAL  Final   Special Requests   Final    BOTTLES DRAWN AEROBIC AND ANAEROBIC 10CC AER 5CC ANA   Culture NO GROWTH < 24  HOURS  Final   Report Status PENDING  Incomplete  Culture, blood (Routine X 2) w Reflex to ID Panel     Status: None (Preliminary result)   Collection Time: 04/18/16 12:31 PM  Result Value Ref Range Status   Specimen Description BLOOD RIGHT HAND  Final   Special Requests BOTTLES DRAWN AEROBIC ONLY 10CC  Final   Culture NO GROWTH < 24 HOURS  Final   Report Status PENDING  Incomplete         Radiology Studies: Mr Hand Right W Wo Contrast  Result Date: 04/19/2016 CLINICAL DATA:  Right hand and wrist pain and swelling after possible snake bite 04/08/2016 to the index finger. Initial encounter. EXAM: MRI OF THE RIGHT HAND WITHOUT AND WITH CONTRAST; MRI OF THE RIGHT WRIST WITHOUT  AND WITH CONTRAST TECHNIQUE: Multiplanar, multisequence MR imaging was performed both before and after administration of intravenous contrast. CONTRAST:  15mL MULTIHANCE GADOBENATE DIMEGLUMINE 529 MG/ML IV SOLN COMPARISON:  Radiographs 04/18/2016 FINDINGS: Despite efforts by the technologist and patient, motion artifact is present on today's exam and could not be eliminated. This reduces exam sensitivity and specificity. Bones/Joint/Cartilage Marrow assessment within the index finger is limited by incomplete fat saturation on the T2 weighted and postcontrast images. There is possible mild bone marrow edema within the base of the distal phalanx, best seen on sagittal series 15. There is no cortical destruction, abnormal T1 signal or definite abnormal marrow enhancement. There is no large joint effusion or suspicious synovial enhancement. A small amount of fluid is present in the distal radioulnar joint. As demonstrated radiographically, there are underlying interphalangeal degenerative changes which are most advanced at the second DIP joint. Lesser degenerative changes are present at the metacarpal phalangeal joints and within the lunate, scaphoid and distal radius. No worrisome carpal bone findings. Ligaments The collateral  ligaments within the fingers appear intact. The triangular fibrocartilage complex is intact. There is degeneration of the scapholunate ligament without gross tear. Muscles and Tendons The flexor and extensor tendons appear intact within the fingers. There is no significant tenosynovitis. Soft tissue Subcutaneous edema is present throughout the index finger, greatest distally. There is an ill-defined fluid collection dorsal to the proximal interphalangeal joint of the index finger, best seen on the sagittal and axial T2 weighted images. This demonstrates no suspicious enhancement following contrast. There is no evidence of foreign body. IMPRESSION: 1. Inflammatory changes within the subcutaneous tissues of the index finger most consistent with cellulitis. Possible phlegmon dorsal to the proximal interphalangeal joint of the index finger. No focal abscess or tenosynovitis demonstrated. 2. No specific signs of osteomyelitis identified. There is possible low-level edema within the base of the second distal phalanx, suboptimally evaluated due to incomplete fat saturation. This could be due to underlying osteoarthritis which is most advanced at the second DIP joint. 3. A preliminary report relaying these findings was generated by Dr. Manus Gunning on 04/19/2016 at 0032 hours. Electronically Signed   By: Carey Bullocks M.D.   On: 04/19/2016 08:25   Mr Wrist Right W Wo Contrast  Result Date: 04/19/2016 CLINICAL DATA:  Right hand and wrist pain and swelling after possible snake bite 04/08/2016 to the index finger. Initial encounter. EXAM: MRI OF THE RIGHT HAND WITHOUT AND WITH CONTRAST; MRI OF THE RIGHT WRIST WITHOUT AND WITH CONTRAST TECHNIQUE: Multiplanar, multisequence MR imaging was performed both before and after administration of intravenous contrast. CONTRAST:  15mL MULTIHANCE GADOBENATE DIMEGLUMINE 529 MG/ML IV SOLN COMPARISON:  Radiographs 04/18/2016 FINDINGS: Despite efforts by the technologist and patient, motion  artifact is present on today's exam and could not be eliminated. This reduces exam sensitivity and specificity. Bones/Joint/Cartilage Marrow assessment within the index finger is limited by incomplete fat saturation on the T2 weighted and postcontrast images. There is possible mild bone marrow edema within the base of the distal phalanx, best seen on sagittal series 15. There is no cortical destruction, abnormal T1 signal or definite abnormal marrow enhancement. There is no large joint effusion or suspicious synovial enhancement. A small amount of fluid is present in the distal radioulnar joint. As demonstrated radiographically, there are underlying interphalangeal degenerative changes which are most advanced at the second DIP joint. Lesser degenerative changes are present at the metacarpal phalangeal joints and within the lunate, scaphoid and distal radius. No worrisome carpal  bone findings. Ligaments The collateral ligaments within the fingers appear intact. The triangular fibrocartilage complex is intact. There is degeneration of the scapholunate ligament without gross tear. Muscles and Tendons The flexor and extensor tendons appear intact within the fingers. There is no significant tenosynovitis. Soft tissue Subcutaneous edema is present throughout the index finger, greatest distally. There is an ill-defined fluid collection dorsal to the proximal interphalangeal joint of the index finger, best seen on the sagittal and axial T2 weighted images. This demonstrates no suspicious enhancement following contrast. There is no evidence of foreign body. IMPRESSION: 1. Inflammatory changes within the subcutaneous tissues of the index finger most consistent with cellulitis. Possible phlegmon dorsal to the proximal interphalangeal joint of the index finger. No focal abscess or tenosynovitis demonstrated. 2. No specific signs of osteomyelitis identified. There is possible low-level edema within the base of the second distal  phalanx, suboptimally evaluated due to incomplete fat saturation. This could be due to underlying osteoarthritis which is most advanced at the second DIP joint. 3. A preliminary report relaying these findings was generated by Dr. Manus Gunning on 04/19/2016 at 0032 hours. Electronically Signed   By: Carey Bullocks M.D.   On: 04/19/2016 08:25   Dg Hand Complete Right  Result Date: 04/18/2016 CLINICAL DATA:  Snake bite to posterior index finger 2 weeks ago, still has an open wound, question osteomyelitis EXAM: RIGHT HAND - COMPLETE 3+ VIEW COMPARISON:  None FINDINGS: Mild diffuse osseous demineralization questioned. Scattered soft tissue swelling throughout RIGHT hand. Degenerative changes at scattered DIP joints. No acute fracture, dislocation or bone destruction. IMPRESSION: No acute osseous abnormalities. Diffuse soft tissue swelling RIGHT hand with degenerative changes at DIP joints. Electronically Signed   By: Ulyses Southward M.D.   On: 04/18/2016 11:42        Scheduled Meds: . gabapentin  800 mg Oral TID  . levofloxacin  750 mg Oral Daily   Continuous Infusions:    LOS: 0 days       Mauricio Annett Gula, MD [facility] Pager [phone] If 7PM-7AM, please contact night-coverage [email or web address] Password [email or web address] 04/19/2016, 11:50 AM

## 2016-04-19 NOTE — Care Management Note (Signed)
Case Management Note  Patient Details  Name: Sabrina [name] MRN: [id number] Date of Birth: October 31, 1965  Subjective/Objective:   Pt in with wound infection from a snake bite. She is from home alone.                  Action/Plan: ID and hand surgeon to consult on pt. CM following for discharge needs.   Expected Discharge Date:                  Expected Discharge Plan:  Home/Self Care  In-House Referral:     Discharge planning Services     Post Acute Care Choice:    Choice offered to:     DME Arranged:    DME Agency:     HH Arranged:    HH Agency:     Status of Service:  In process, will continue to follow  If discussed at Long Length of Stay Meetings, dates discussed:    Additional Comments:  Kermit BaloKelli F Richar Dunklee, RN 04/19/2016, 12:35 PM

## 2016-04-20 DIAGNOSIS — T63001A Toxic effect of unspecified snake venom, accidental (unintentional), initial encounter: Secondary | ICD-10-CM

## 2016-04-20 DIAGNOSIS — T148 Other injury of unspecified body region: Secondary | ICD-10-CM

## 2016-04-20 DIAGNOSIS — L089 Local infection of the skin and subcutaneous tissue, unspecified: Secondary | ICD-10-CM

## 2016-04-20 LAB — CBC WITH DIFFERENTIAL/PLATELET
BASOS PCT: 0 %
Basophils Absolute: 0 10*3/uL (ref 0.0–0.1)
EOS PCT: 3 %
Eosinophils Absolute: 0.1 10*3/uL (ref 0.0–0.7)
HEMATOCRIT: 34.7 % — AB (ref 36.0–46.0)
HEMOGLOBIN: 11.8 g/dL — AB (ref 12.0–15.0)
LYMPHS ABS: 1.5 10*3/uL (ref 0.7–4.0)
LYMPHS PCT: 34 %
MCH: 31.1 pg (ref 26.0–34.0)
MCHC: 34 g/dL (ref 30.0–36.0)
MCV: 91.6 fL (ref 78.0–100.0)
MONOS PCT: 9 %
Monocytes Absolute: 0.4 10*3/uL (ref 0.1–1.0)
NEUTROS ABS: 2.4 10*3/uL (ref 1.7–7.7)
Neutrophils Relative %: 54 %
Platelets: 189 10*3/uL (ref 150–400)
RBC: 3.79 MIL/uL — ABNORMAL LOW (ref 3.87–5.11)
RDW: 13.7 % (ref 11.5–15.5)
WBC: 4.4 10*3/uL (ref 4.0–10.5)

## 2016-04-20 LAB — BASIC METABOLIC PANEL
Anion gap: 8 (ref 5–15)
BUN: 14 mg/dL (ref 6–20)
CHLORIDE: 110 mmol/L (ref 101–111)
CO2: 24 mmol/L (ref 22–32)
CREATININE: 0.87 mg/dL (ref 0.44–1.00)
Calcium: 9.1 mg/dL (ref 8.9–10.3)
GFR calc non Af Amer: >60 mL/min (ref >60)
GLUCOSE: 101 mg/dL — AB (ref 65–99)
Potassium: 3.7 mmol/L (ref 3.5–5.1)
Sodium: 142 mmol/L (ref 135–145)

## 2016-04-20 MED ORDER — COLLAGENASE 250 UNIT/GM EX OINT
TOPICAL_OINTMENT | Freq: Every day | CUTANEOUS | Status: DC
  Administered 2016-04-20 – 2016-04-23 (×4): via TOPICAL
  Filled 2016-04-20: qty 30

## 2016-04-20 NOTE — Progress Notes (Signed)
Physical Therapy Wound Treatment Patient Details  Name: FUSAKO TANABE MRN: [id number] Date of Birth: 1965-12-07  Today's Date: 04/20/2016 Time: 9678-9381 Time Calculation (min): 32 min  Subjective  Subjective: "It's already starting to feel better." Patient and Family Stated Goals: To use my hand so I can return to my job. Date of Onset: 04/06/16 Prior Treatments: Antibiotics and dressing changes.  Pain Score: Pain Score: 7   Wound Assessment  Wound / Incision (Open or Dehisced) 04/20/16 Other (Comment) Finger (Comment which one) Right open skin area (Active)  Dressing Type Gauze (Comment);Moist to dry 04/20/2016  2:00 PM  Dressing Changed Changed 04/20/2016  2:00 PM  Dressing Status Clean;Dry;Intact 04/20/2016  2:00 PM  Dressing Change Frequency Daily 04/20/2016  2:00 PM  Site / Wound Assessment Brown;Granulation tissue;Painful;Pink;Yellow 04/20/2016  2:00 PM  % Wound base Red or Granulating 40% 04/20/2016  2:00 PM  % Wound base Yellow 60% 04/20/2016  2:00 PM  Peri-wound Assessment Edema;Erythema (blanchable) 04/20/2016  2:00 PM  Wound Length (cm) 2.7 cm 04/20/2016  2:00 PM  Wound Width (cm) 3.3 cm 04/20/2016  2:00 PM  Wound Depth (cm) 0.1 cm 04/20/2016  2:00 PM  Margins Unattached edges (unapproximated) 04/20/2016  2:00 PM  Closure None 04/20/2016  2:00 PM  Drainage Amount Scant 04/20/2016  2:00 PM  Drainage Description Serous;No odor 04/20/2016  2:00 PM  Treatment Debridement (Selective);Hydrotherapy (Pulse lavage) 04/20/2016  2:00 PM   Hydrotherapy Pulsed lavage therapy - wound location: R Index Finger Pulsed Lavage with Suction (psi): 4 psi (-8) Pulsed Lavage with Suction - Normal Saline Used: 500 mL Pulsed Lavage Tip: Tip with splash shield Selective Debridement Selective Debridement - Location: R Index Finger Selective Debridement - Tools Used: Forceps;Scissors Selective Debridement - Tissue Removed: Yellow slough   Wound Assessment and Plan  Wound Therapy - Assess/Plan/Recommendations Wound  Therapy - Clinical Statement: pt would benefit from continued hydrotherapy to remove slough and promote wound healing.   Wound Therapy - Functional Problem List: Yellow Slough, Diminished Sensation, Impaired ROM. Factors Delaying/Impairing Wound Healing: Altered sensation;Immobility (Immobility of R Index Finger) Hydrotherapy Plan: Debridement;Dressing change;Patient/family education;Pulsatile lavage with suction Wound Therapy - Frequency: 6X / week Wound Plan: See Above.  Wound Therapy Goals- Improve the function of patient's integumentary system by progressing the wound(s) through the phases of wound healing (inflammation - proliferation - remodeling) by: Decrease Necrotic Tissue to: 20 Decrease Necrotic Tissue - Progress: Goal set today Increase Granulation Tissue to: 80 Increase Granulation Tissue - Progress: Goal set today Goals/treatment plan/discharge plan were made with and agreed upon by patient/family: Yes Time For Goal Achievement: 7 days Wound Therapy - Potential for Goals: Good  Goals will be updated until maximal potential achieved or discharge criteria met.  Discharge criteria: when goals achieved, discharge from hospital, MD decision/surgical intervention, no progress towards goals, refusal/missing three consecutive treatments without notification or medical reason.  GP     Laymond Postle, [place], [place] 04/20/2016, 2:23 PM

## 2016-04-20 NOTE — Consult Note (Addendum)
WOC Nurse wound consult note Consult requested for right first finger; this was not performed yesterday since EMR indicated that hand surgery would perform the consult instead.  Progress notes now indicate that they will not be following the patient and WOC has been re-consulted today Reason for Consult: Co[place]head bite to finger which occurred several days ago has continued to decline, according to the patient. Wound type: Full thickness wounds to middle of 1st right finger in patchy areas. Measurement: Affected area 1.8X4cm Wound bed: 5% red, 95% yellow slough Drainage (amount, consistency, odor) small amt yellow drainage, no odor Periwound: Generalized edema and erythremia and edema to the affected digit Dressing procedure/placement/frequency: Apply Santyl to right first finger Q day, then cover with moist fluffed gauze.  Physical therapy to apply after hydrotherapy Q M-Sat.  This therapy is only available as an inpatient, and should assist with removal of nonviable tissue if it is performed for several days. Discussed use of Santyl for enzymatic debridement with patient after discharge and she verbalized understanding of topical treatment.  [facility] to continue to assess the wound; EMR indicates she does not have insurance so she cannot be followed by the outpatient wound clinic.  Please arrange the follow-up if desired after discharge. Please re-consult if further assistance is needed.  Thank-you,  Cammie Mcgeeawn Nance Mccombs MSN, RN, CWOCN, [facility]AP, CNS [phone]

## 2016-04-20 NOTE — Progress Notes (Signed)
Subjective:  Shing cannot flex her finger at present is concerned about being a return to her work   Antibiotics:  Anti-infectives    Start     Dose/Rate Route Frequency Ordered Stop   04/19/16 1100  levofloxacin (LEVAQUIN) tablet 750 mg     750 mg Oral Daily 04/19/16 0957        Medications: Scheduled Meds: . collagenase   Topical Daily  . gabapentin  800 mg Oral TID  . levofloxacin  750 mg Oral Daily   Continuous Infusions:  PRN Meds:.clonazePAM, HYDROcodone-acetaminophen, morphine injection, promethazine    Objective: Weight change:   Intake/Output Summary (Last 24 hours) at 04/20/16 1648 Last data filed at 04/20/16 0907  Gross per 24 hour  Intake              360 ml  Output                0 ml  Net              360 ml   Blood pressure (!) 102/56, pulse 100, temperature 99.1 F (37.3 C), temperature source Oral, resp. rate 18, height  (1.626 m), weight 184 lb 1.4 oz (83.5 kg), SpO2 100 %. Temp:  [97.8 F (36.6 C)-99.1 F (37.3 C)] 99.1 F (37.3 C) (08/09 1420) Pulse Rate:  [87-100] 100 (08/09 1420) Resp:  [18-20] 18 (08/09 1420) BP: (90-104)/(48-68) 102/56 (08/09 1420) SpO2:  [96 %-100 %] 100 % (08/09 1420)  Physical Exam:  General: Alert and awake, oriented x3, not in any acute distress. HEENT: anicteric sclera,  EOMI, oropharynx clear and without exudate Cardiovascular: egular rate, normal r,  no murmur rubs or gallops Pulmonary: clear to auscultation bilaterally, no wheezing, rales or rhonchi Gastrointestinal: soft nontender, nondistended, normal bowel sounds, Musculoskeletal: no  clubbing or edema noted bilaterally Skin, soft tissue:  Wound:      04/19/16:         04/20/16:         CBC:   CBC Latest Ref Rng & Units 04/20/2016 04/18/2016 04/13/2016  WBC 4.0 - 10.5 K/uL 4.4 4.4 3.7(L)  Hemoglobin 12.0 - 15.0 g/dL 11.8(L) 11.2(L) 10.8(L)  Hematocrit 36.0 - 46.0 % 34.7(L) 34.9(L) 34.3(L)  Platelets 150 - 400  K/uL 189 177 202     BMET  Recent Labs  04/18/16 1215 04/20/16 0703  NA 140 142  K 3.5 3.7  CL 107 110  CO2 27 24  GLUCOSE 95 101*  BUN 16 14  CREATININE 0.83 0.87  CALCIUM 9.0 9.1     Liver Panel   Recent Labs  04/18/16 1215  PROT 6.1*  ALBUMIN 3.6  AST 24  ALT 17  ALKPHOS 79  BILITOT 0.2*       Sedimentation Rate No results for input(s): ESRSEDRATE in the last 72 hours. C-Reactive Protein No results for input(s): CRP in the last 72 hours.  Micro Results: Recent Results (from the past 720 hour(s))  Culture, blood (Routine x 2)     Status: None   Collection Time: 04/11/16  5:28 PM  Result Value Ref Range Status   Specimen Description BLOOD LEFT HAND  Final   Special Requests BOTTLES DRAWN AEROBIC AND ANAEROBIC 5CC  Final   Culture NO GROWTH 5 DAYS  Final   Report Status 04/16/2016 FINAL  Final  Urine culture     Status: None   Collection Time: 04/11/16  7:32 PM  Result Value Ref  Range Status   Specimen Description URINE, RANDOM  Final   Special Requests NONE  Final   Culture NO GROWTH  Final   Report Status 04/13/2016 FINAL  Final  Culture, blood (Routine x 2)     Status: None   Collection Time: 04/11/16  8:07 PM  Result Value Ref Range Status   Specimen Description BLOOD LEFT HAND  Final   Special Requests IN PEDIATRIC BOTTLE 1CC  Final   Culture NO GROWTH 5 DAYS  Final   Report Status 04/16/2016 FINAL  Final  Urine culture     Status: None   Collection Time: 04/12/16  2:13 AM  Result Value Ref Range Status   Specimen Description URINE, RANDOM  Final   Special Requests NONE  Final   Culture NO GROWTH  Final   Report Status 04/13/2016 FINAL  Final  Culture, blood (Routine X 2) w Reflex to ID Panel     Status: None (Preliminary result)   Collection Time: 04/18/16 12:10 PM  Result Value Ref Range Status   Specimen Description BLOOD RIGHT ANTECUBITAL  Final   Special Requests   Final    BOTTLES DRAWN AEROBIC AND ANAEROBIC 10CC AER 5CC ANA    Culture NO GROWTH 2 DAYS  Final   Report Status PENDING  Incomplete  Culture, blood (Routine X 2) w Reflex to ID Panel     Status: None (Preliminary result)   Collection Time: 04/18/16 12:31 PM  Result Value Ref Range Status   Specimen Description BLOOD RIGHT HAND  Final   Special Requests BOTTLES DRAWN AEROBIC ONLY 10CC  Final   Culture NO GROWTH 2 DAYS  Final   Report Status PENDING  Incomplete    Studies/Results: Mr Hand Right W Wo Contrast  Result Date: 04/19/2016 CLINICAL DATA:  Right hand and wrist pain and swelling after possible snake bite 04/08/2016 to the index finger. Initial encounter. EXAM: MRI OF THE RIGHT HAND WITHOUT AND WITH CONTRAST; MRI OF THE RIGHT WRIST WITHOUT AND WITH CONTRAST TECHNIQUE: Multiplanar, multisequence MR imaging was performed both before and after administration of intravenous contrast. CONTRAST:  15mL MULTIHANCE GADOBENATE DIMEGLUMINE 529 MG/ML IV SOLN COMPARISON:  Radiographs 04/18/2016 FINDINGS: Despite efforts by the technologist and patient, motion artifact is present on today's exam and could not be eliminated. This reduces exam sensitivity and specificity. Bones/Joint/Cartilage Marrow assessment within the index finger is limited by incomplete fat saturation on the T2 weighted and postcontrast images. There is possible mild bone marrow edema within the base of the distal phalanx, best seen on sagittal series 15. There is no cortical destruction, abnormal T1 signal or definite abnormal marrow enhancement. There is no large joint effusion or suspicious synovial enhancement. A small amount of fluid is present in the distal radioulnar joint. As demonstrated radiographically, there are underlying interphalangeal degenerative changes which are most advanced at the second DIP joint. Lesser degenerative changes are present at the metacarpal phalangeal joints and within the lunate, scaphoid and distal radius. No worrisome carpal bone findings. Ligaments The collateral  ligaments within the fingers appear intact. The triangular fibrocartilage complex is intact. There is degeneration of the scapholunate ligament without gross tear. Muscles and Tendons The flexor and extensor tendons appear intact within the fingers. There is no significant tenosynovitis. Soft tissue Subcutaneous edema is present throughout the index finger, greatest distally. There is an ill-defined fluid collection dorsal to the proximal interphalangeal joint of the index finger, best seen on the sagittal and axial T2 weighted images. This  demonstrates no suspicious enhancement following contrast. There is no evidence of foreign body. IMPRESSION: 1. Inflammatory changes within the subcutaneous tissues of the index finger most consistent with cellulitis. Possible phlegmon dorsal to the proximal interphalangeal joint of the index finger. No focal abscess or tenosynovitis demonstrated. 2. No specific signs of osteomyelitis identified. There is possible low-level edema within the base of the second distal phalanx, suboptimally evaluated due to incomplete fat saturation. This could be due to underlying osteoarthritis which is most advanced at the second DIP joint. 3. A preliminary report relaying these findings was generated by Dr. Manus GunningEhinger on 04/19/2016 at 0032 hours. Electronically Signed   By: Carey BullocksWilliam  Veazey M.D.   On: 04/19/2016 08:25   Mr Wrist Right W Wo Contrast  Result Date: 04/19/2016 CLINICAL DATA:  Right hand and wrist pain and swelling after possible snake bite 04/08/2016 to the index finger. Initial encounter. EXAM: MRI OF THE RIGHT HAND WITHOUT AND WITH CONTRAST; MRI OF THE RIGHT WRIST WITHOUT AND WITH CONTRAST TECHNIQUE: Multiplanar, multisequence MR imaging was performed both before and after administration of intravenous contrast. CONTRAST:  15mL MULTIHANCE GADOBENATE DIMEGLUMINE 529 MG/ML IV SOLN COMPARISON:  Radiographs 04/18/2016 FINDINGS: Despite efforts by the technologist and patient, motion  artifact is present on today's exam and could not be eliminated. This reduces exam sensitivity and specificity. Bones/Joint/Cartilage Marrow assessment within the index finger is limited by incomplete fat saturation on the T2 weighted and postcontrast images. There is possible mild bone marrow edema within the base of the distal phalanx, best seen on sagittal series 15. There is no cortical destruction, abnormal T1 signal or definite abnormal marrow enhancement. There is no large joint effusion or suspicious synovial enhancement. A small amount of fluid is present in the distal radioulnar joint. As demonstrated radiographically, there are underlying interphalangeal degenerative changes which are most advanced at the second DIP joint. Lesser degenerative changes are present at the metacarpal phalangeal joints and within the lunate, scaphoid and distal radius. No worrisome carpal bone findings. Ligaments The collateral ligaments within the fingers appear intact. The triangular fibrocartilage complex is intact. There is degeneration of the scapholunate ligament without gross tear. Muscles and Tendons The flexor and extensor tendons appear intact within the fingers. There is no significant tenosynovitis. Soft tissue Subcutaneous edema is present throughout the index finger, greatest distally. There is an ill-defined fluid collection dorsal to the proximal interphalangeal joint of the index finger, best seen on the sagittal and axial T2 weighted images. This demonstrates no suspicious enhancement following contrast. There is no evidence of foreign body. IMPRESSION: 1. Inflammatory changes within the subcutaneous tissues of the index finger most consistent with cellulitis. Possible phlegmon dorsal to the proximal interphalangeal joint of the index finger. No focal abscess or tenosynovitis demonstrated. 2. No specific signs of osteomyelitis identified. There is possible low-level edema within the base of the second distal  phalanx, suboptimally evaluated due to incomplete fat saturation. This could be due to underlying osteoarthritis which is most advanced at the second DIP joint. 3. A preliminary report relaying these findings was generated by Dr. Manus GunningEhinger on 04/19/2016 at 0032 hours. Electronically Signed   By: Carey BullocksWilliam  Veazey M.D.   On: 04/19/2016 08:25      Assessment/Plan:  INTERVAL HISTORY:  Dr Melvyn Novasrtmann does not feel patient needs a surgical intervention     Principal Problem:   Wound infection [facility](HCC) Active Problems:   Snake bite   Right hand pain   Wrist swelling   Paresthesia  Abscess of finger    BURNIE HANK is a 50 y.o. female with  with puncture wound to finger lead to be ischemic snake bite which did not respond to less than a day of Augmentin but did respond to vancomycin and Zosyn and then sent home with doxycycline with worsening of her wound.  #1 Worsening wound after puncture potentially snake bite:  Concern is for uncontrolled infection at the site  Fortunately she does NOT have osteomyelitis by imaging but she DOES have phlegmonous changes that worry me especially since this has been going on for several weeks and resulted in 2 admissions to [facility]  Levofloxacin continue to monitor the wound  Has improvement and ultimately discharges to home she should be seen in close follow-up.  I am worried that we will end up having to admit her yet again in another week or so and spend money on yet another MRI but hopefully I will be proven wrong    LOS: 1 day   Acey Lav 04/20/2016, 4:48 PM

## 2016-04-20 NOTE — Progress Notes (Addendum)
PROGRESS NOTE    Sabrina [name]  WUJ:[id number] DOB: 02/24/1966 DOA: 04/18/2016 PCP: Concepcion LivingBERNHARDT, [name], NP   Outpatient Specialists:     Brief Narrative:  Sabrina [name] is a 50 y.o. female with medical history significant of recent snake bite of finger 2 weeks ago.  Seen and d/c'd from ER on 7/28.  Admitted with subsequent cellulitis on 7/31 and then discharged on 8/3 on doxycycline.  Patient returns to ED with worsening erythema, pain, and cellulitis of finger despite ABx.   Assessment & Plan:   Principal Problem:   Wound infection (HCC) Active Problems:   Snake bite   Right hand pain   Wrist swelling   Paresthesia   Abscess of finger   Cellulitis with phlegmon: Patient failed outpatient therapy (augmentin and doxy).  -levofloxacin per I.D recommendations -MRI with possible phlegmon dorsal to the proximal interphalangeal joint of the index finger.  -elevate extremity -hydrotherapy ordered Spoke with Dr. Orlan Leavensrtman, nothing for him to do at this point-- movement should improve once area has healed  Anxiety. Stable -clonazepam and gabapentin per patient's home regimen.   Bullous pemphigus. Stable, follow as outpatient.    DVT prophylaxis:  SCD's  Code Status: Full Code   Family Communication:   Disposition Plan:     Consultants:   ID  Procedures:   hydrotherapy      Subjective: Patient unable to flex index finger into fist  Objective: Vitals:   04/19/16 2101 04/20/16 0123 04/20/16 0454 04/20/16 0900  BP: 91/63 (!) 101/59 104/68 (!) 95/54  Pulse: 87 89 94 89  Resp: 20 20 20 18   Temp: 97.9 F (36.6 C) 98 F (36.7 C) 97.8 F (36.6 C) 98.6 F (37 C)  TempSrc: Oral Oral Oral Oral  SpO2: 97% 98% 100% 100%  Weight:      Height:        Intake/Output Summary (Last 24 hours) at 04/20/16 1315 Last data filed at 04/20/16 0907  Gross per 24 hour  Intake              360 ml  Output                0 ml  Net              360 ml   Filed Weights   04/18/16 1436 04/19/16 0349  Weight: 82.6 kg (182 lb) 83.5 kg (184 lb 1.4 oz)    Examination:  General exam: Appears calm and comfortable  Respiratory system: Clear to auscultation. Respiratory effort normal. Cardiovascular system: S1 & S2 heard, RRR. No JVD, murmurs, rubs, gallops or clicks. No pedal edema. Gastrointestinal system: Abdomen is nondistended, soft and nontender. No organomegaly or masses felt. Normal bowel sounds heard. Central nervous system: Alert and oriented. No focal neurological deficits. Extremities: Symmetric 5 x 5 power. Skin: right index finger with ulcerated lesion proximal, no drainage but irregular boarders Psychiatry: Judgement and insight appear normal. Mood & affect appropriate.     Data Reviewed: I have personally reviewed following labs and imaging studies  CBC:  Recent Labs Lab 04/18/16 1215 04/20/16 0703  WBC 4.4 4.4  NEUTROABS 2.0 2.4  HGB 11.2* 11.8*  HCT 34.9* 34.7*  MCV 94.8 91.6  PLT 177 189   Basic Metabolic Panel:  Recent Labs Lab 04/18/16 1215 04/20/16 0703  NA 140 142  K 3.5 3.7  CL 107 110  CO2 27 24  GLUCOSE 95 101*  BUN 16 14  CREATININE 0.83 0.87  CALCIUM 9.0 9.1   GFR: Estimated Creatinine Clearance: 83.5 mL/min (by C-G formula based on SCr of 0.87 mg/dL). Liver Function Tests:  Recent Labs Lab 04/18/16 1215  AST 24  ALT 17  ALKPHOS 79  BILITOT 0.2*  PROT 6.1*  ALBUMIN 3.6   No results for input(s): LIPASE, AMYLASE in the last 168 hours. No results for input(s): AMMONIA in the last 168 hours. Coagulation Profile: No results for input(s): INR, PROTIME in the last 168 hours. Cardiac Enzymes: No results for input(s): CKTOTAL, CKMB, CKMBINDEX, TROPONINI in the last 168 hours. BNP (last 3 results) No results for input(s): PROBNP in the last 8760 hours. HbA1C: No results for input(s): HGBA1C in the last 72 hours. CBG: No results for input(s): GLUCAP in the last 168 hours. Lipid Profile: No results for  input(s): CHOL, HDL, LDLCALC, TRIG, CHOLHDL, LDLDIRECT in the last 72 hours. Thyroid Function Tests: No results for input(s): TSH, T4TOTAL, FREET4, T3FREE, THYROIDAB in the last 72 hours. Anemia Panel: No results for input(s): VITAMINB12, FOLATE, FERRITIN, TIBC, IRON, RETICCTPCT in the last 72 hours. Urine analysis:    Component Value Date/Time   COLORURINE YELLOW 04/12/2016 0213   APPEARANCEUR CLEAR 04/12/2016 0213   LABSPEC 1.019 04/12/2016 0213   PHURINE 5.5 04/12/2016 0213   GLUCOSEU NEGATIVE 04/12/2016 0213   HGBUR NEGATIVE 04/12/2016 0213   BILIRUBINUR NEGATIVE 04/12/2016 0213   KETONESUR NEGATIVE 04/12/2016 0213   PROTEINUR NEGATIVE 04/12/2016 0213   UROBILINOGEN 0.2 07/05/2015 0627   NITRITE NEGATIVE 04/12/2016 0213   LEUKOCYTESUR SMALL (A) 04/12/2016 0213     ) Recent Results (from the past 240 hour(s))  Culture, blood (Routine x 2)     Status: None   Collection Time: 04/11/16  5:28 PM  Result Value Ref Range Status   Specimen Description BLOOD LEFT HAND  Final   Special Requests BOTTLES DRAWN AEROBIC AND ANAEROBIC 5CC  Final   Culture NO GROWTH 5 DAYS  Final   Report Status 04/16/2016 FINAL  Final  Urine culture     Status: None   Collection Time: 04/11/16  7:32 PM  Result Value Ref Range Status   Specimen Description URINE, RANDOM  Final   Special Requests NONE  Final   Culture NO GROWTH  Final   Report Status 04/13/2016 FINAL  Final  Culture, blood (Routine x 2)     Status: None   Collection Time: 04/11/16  8:07 PM  Result Value Ref Range Status   Specimen Description BLOOD LEFT HAND  Final   Special Requests IN PEDIATRIC BOTTLE 1CC  Final   Culture NO GROWTH 5 DAYS  Final   Report Status 04/16/2016 FINAL  Final  Urine culture     Status: None   Collection Time: 04/12/16  2:13 AM  Result Value Ref Range Status   Specimen Description URINE, RANDOM  Final   Special Requests NONE  Final   Culture NO GROWTH  Final   Report Status 04/13/2016 FINAL  Final    Culture, blood (Routine X 2) w Reflex to ID Panel     Status: None (Preliminary result)   Collection Time: 04/18/16 12:10 PM  Result Value Ref Range Status   Specimen Description BLOOD RIGHT ANTECUBITAL  Final   Special Requests   Final    BOTTLES DRAWN AEROBIC AND ANAEROBIC 10CC AER 5CC ANA   Culture NO GROWTH 1 DAY  Final   Report Status PENDING  Incomplete  Culture, blood (Routine X 2) w Reflex to ID Panel  Status: None (Preliminary result)   Collection Time: 04/18/16 12:31 PM  Result Value Ref Range Status   Specimen Description BLOOD RIGHT HAND  Final   Special Requests BOTTLES DRAWN AEROBIC ONLY 10CC  Final   Culture NO GROWTH 1 DAY  Final   Report Status PENDING  Incomplete      Anti-infectives    Start     Dose/Rate Route Frequency Ordered Stop   04/19/16 1100  levofloxacin (LEVAQUIN) tablet 750 mg     750 mg Oral Daily 04/19/16 0957         Radiology Studies: Mr Hand Right W Wo Contrast  Result Date: 04/19/2016 CLINICAL DATA:  Right hand and wrist pain and swelling after possible snake bite 04/08/2016 to the index finger. Initial encounter. EXAM: MRI OF THE RIGHT HAND WITHOUT AND WITH CONTRAST; MRI OF THE RIGHT WRIST WITHOUT AND WITH CONTRAST TECHNIQUE: Multiplanar, multisequence MR imaging was performed both before and after administration of intravenous contrast. CONTRAST:  15mL MULTIHANCE GADOBENATE DIMEGLUMINE 529 MG/ML IV SOLN COMPARISON:  Radiographs 04/18/2016 FINDINGS: Despite efforts by the technologist and patient, motion artifact is present on today's exam and could not be eliminated. This reduces exam sensitivity and specificity. Bones/Joint/Cartilage Marrow assessment within the index finger is limited by incomplete fat saturation on the T2 weighted and postcontrast images. There is possible mild bone marrow edema within the base of the distal phalanx, best seen on sagittal series 15. There is no cortical destruction, abnormal T1 signal or definite abnormal  marrow enhancement. There is no large joint effusion or suspicious synovial enhancement. A small amount of fluid is present in the distal radioulnar joint. As demonstrated radiographically, there are underlying interphalangeal degenerative changes which are most advanced at the second DIP joint. Lesser degenerative changes are present at the metacarpal phalangeal joints and within the lunate, scaphoid and distal radius. No worrisome carpal bone findings. Ligaments The collateral ligaments within the fingers appear intact. The triangular fibrocartilage complex is intact. There is degeneration of the scapholunate ligament without gross tear. Muscles and Tendons The flexor and extensor tendons appear intact within the fingers. There is no significant tenosynovitis. Soft tissue Subcutaneous edema is present throughout the index finger, greatest distally. There is an ill-defined fluid collection dorsal to the proximal interphalangeal joint of the index finger, best seen on the sagittal and axial T2 weighted images. This demonstrates no suspicious enhancement following contrast. There is no evidence of foreign body. IMPRESSION: 1. Inflammatory changes within the subcutaneous tissues of the index finger most consistent with cellulitis. Possible phlegmon dorsal to the proximal interphalangeal joint of the index finger. No focal abscess or tenosynovitis demonstrated. 2. No specific signs of osteomyelitis identified. There is possible low-level edema within the base of the second distal phalanx, suboptimally evaluated due to incomplete fat saturation. This could be due to underlying osteoarthritis which is most advanced at the second DIP joint. 3. A preliminary report relaying these findings was generated by Dr. Manus Gunning on 04/19/2016 at 0032 hours. Electronically Signed   By: Carey Bullocks M.D.   On: 04/19/2016 08:25   Mr Wrist Right W Wo Contrast  Result Date: 04/19/2016 CLINICAL DATA:  Right hand and wrist pain and  swelling after possible snake bite 04/08/2016 to the index finger. Initial encounter. EXAM: MRI OF THE RIGHT HAND WITHOUT AND WITH CONTRAST; MRI OF THE RIGHT WRIST WITHOUT AND WITH CONTRAST TECHNIQUE: Multiplanar, multisequence MR imaging was performed both before and after administration of intravenous contrast. CONTRAST:  15mL MULTIHANCE GADOBENATE  DIMEGLUMINE 529 MG/ML IV SOLN COMPARISON:  Radiographs 04/18/2016 FINDINGS: Despite efforts by the technologist and patient, motion artifact is present on today's exam and could not be eliminated. This reduces exam sensitivity and specificity. Bones/Joint/Cartilage Marrow assessment within the index finger is limited by incomplete fat saturation on the T2 weighted and postcontrast images. There is possible mild bone marrow edema within the base of the distal phalanx, best seen on sagittal series 15. There is no cortical destruction, abnormal T1 signal or definite abnormal marrow enhancement. There is no large joint effusion or suspicious synovial enhancement. A small amount of fluid is present in the distal radioulnar joint. As demonstrated radiographically, there are underlying interphalangeal degenerative changes which are most advanced at the second DIP joint. Lesser degenerative changes are present at the metacarpal phalangeal joints and within the lunate, scaphoid and distal radius. No worrisome carpal bone findings. Ligaments The collateral ligaments within the fingers appear intact. The triangular fibrocartilage complex is intact. There is degeneration of the scapholunate ligament without gross tear. Muscles and Tendons The flexor and extensor tendons appear intact within the fingers. There is no significant tenosynovitis. Soft tissue Subcutaneous edema is present throughout the index finger, greatest distally. There is an ill-defined fluid collection dorsal to the proximal interphalangeal joint of the index finger, best seen on the sagittal and axial T2 weighted  images. This demonstrates no suspicious enhancement following contrast. There is no evidence of foreign body. IMPRESSION: 1. Inflammatory changes within the subcutaneous tissues of the index finger most consistent with cellulitis. Possible phlegmon dorsal to the proximal interphalangeal joint of the index finger. No focal abscess or tenosynovitis demonstrated. 2. No specific signs of osteomyelitis identified. There is possible low-level edema within the base of the second distal phalanx, suboptimally evaluated due to incomplete fat saturation. This could be due to underlying osteoarthritis which is most advanced at the second DIP joint. 3. A preliminary report relaying these findings was generated by Dr. Manus Gunning on 04/19/2016 at 0032 hours. Electronically Signed   By: Carey Bullocks M.D.   On: 04/19/2016 08:25        Scheduled Meds: . collagenase   Topical Daily  . gabapentin  800 mg Oral TID  . levofloxacin  750 mg Oral Daily   Continuous Infusions:    LOS: 1 day    Time spent: 25 min    Marcus Groll Juanetta Gosling, DO [facility] Pager [phone]  If 7PM-7AM, please contact night-coverage [email or web address] Password [email or web address] 04/20/2016, 1:15 PM

## 2016-04-20 NOTE — Progress Notes (Signed)
Dressing changed on right index finger. Pt premedicated.    Sim BoastHavy, RN

## 2016-04-21 DIAGNOSIS — R202 Paresthesia of skin: Secondary | ICD-10-CM

## 2016-04-21 LAB — HEPATITIS C ANTIBODY (REFLEX): HCV Ab: <0.1 s/co ratio (ref 0.0–0.9)

## 2016-04-21 LAB — HEPATITIS B SURFACE ANTIGEN: Hepatitis B Surface Ag: NEGATIVE

## 2016-04-21 LAB — HCV COMMENT:

## 2016-04-21 MED ORDER — MORPHINE SULFATE (PF) 2 MG/ML IV SOLN
1.0000 mg | Freq: Every day | INTRAVENOUS | Status: DC | PRN
  Administered 2016-04-21 – 2016-04-23 (×3): 1 mg via INTRAVENOUS
  Filled 2016-04-21 (×3): qty 1

## 2016-04-21 MED ORDER — OXYCODONE HCL 5 MG PO TABS
5.0000 mg | ORAL_TABLET | ORAL | Status: DC | PRN
  Administered 2016-04-21 – 2016-04-22 (×4): 10 mg via ORAL
  Administered 2016-04-22: 5 mg via ORAL
  Administered 2016-04-22 – 2016-04-24 (×7): 10 mg via ORAL
  Filled 2016-04-21 (×12): qty 2

## 2016-04-21 MED ORDER — PROMETHAZINE HCL 25 MG PO TABS
12.5000 mg | ORAL_TABLET | Freq: Four times a day (QID) | ORAL | Status: DC | PRN
  Administered 2016-04-21 – 2016-04-23 (×2): 12.5 mg via ORAL
  Filled 2016-04-21 (×2): qty 1

## 2016-04-21 MED ORDER — OXYCODONE HCL 5 MG PO TABS
5.0000 mg | ORAL_TABLET | Freq: Once | ORAL | Status: AC
  Administered 2016-04-21: 5 mg via ORAL
  Filled 2016-04-21: qty 1

## 2016-04-21 NOTE — Progress Notes (Signed)
Subjective:  She is complaining of a new bump that is come up on her hand   Antibiotics:  Anti-infectives    Start     Dose/Rate Route Frequency Ordered Stop   04/19/16 1100  levofloxacin (LEVAQUIN) tablet 750 mg     750 mg Oral Daily 04/19/16 0957        Medications: Scheduled Meds: . collagenase   Topical Daily  . gabapentin  800 mg Oral TID  . levofloxacin  750 mg Oral Daily   Continuous Infusions:  PRN Meds:.clonazePAM, morphine injection, oxyCODONE, promethazine    Objective: Weight change:   Intake/Output Summary (Last 24 hours) at 04/21/16 1638 Last data filed at 04/21/16 0029  Gross per 24 hour  Intake              480 ml  Output                0 ml  Net              480 ml   Blood pressure (!) 94/55, pulse 84, temperature 98 F (36.7 C), temperature source Oral, resp. rate 18, height  (1.626 m), weight 184 lb 1.4 oz (83.5 kg), SpO2 99 %. Temp:  [97.9 F (36.6 C)-99.2 F (37.3 C)] 98 F (36.7 C) (08/10 1322) Pulse Rate:  [76-100] 84 (08/10 1322) Resp:  [18-20] 18 (08/10 1322) BP: (91-105)/(55-73) 94/55 (08/10 1322) SpO2:  [98 %-100 %] 99 % (08/10 1322)  Physical Exam:  General: Alert and awake, oriented x3, not in any acute distress. HEENT: anicteric sclera,  EOMI, oropharynx clear and without exudate Cardiovascular: egular rate, normal r,  no murmur rubs or gallops Pulmonary: clear to auscultation bilaterally, no wheezing, rales or rhonchi Gastrointestinal: soft nontender, nondistended, normal bowel sounds, Musculoskeletal: no  clubbing or edema noted bilaterally Skin, soft tissue:  Wound:  04/18/16:          04/19/16:           04/20/16:        04/21/16:       CBC:   CBC Latest Ref Rng & Units 04/20/2016 04/18/2016 04/13/2016  WBC 4.0 - 10.5 K/uL 4.4 4.4 3.7(L)  Hemoglobin 12.0 - 15.0 g/dL 11.8(L) 11.2(L) 10.8(L)  Hematocrit 36.0 - 46.0 % 34.7(L) 34.9(L) 34.3(L)  Platelets 150 - 400 K/uL 189  177 202     BMET  Recent Labs  04/20/16 0703  NA 142  K 3.7  CL 110  CO2 24  GLUCOSE 101*  BUN 14  CREATININE 0.87  CALCIUM 9.1     Liver Panel  No results for input(s): PROT, ALBUMIN, AST, ALT, ALKPHOS, BILITOT, BILIDIR, IBILI in the last 72 hours.     Sedimentation Rate No results for input(s): ESRSEDRATE in the last 72 hours. C-Reactive Protein No results for input(s): CRP in the last 72 hours.  Micro Results: Recent Results (from the past 720 hour(s))  Culture, blood (Routine x 2)     Status: None   Collection Time: 04/11/16  5:28 PM  Result Value Ref Range Status   Specimen Description BLOOD LEFT HAND  Final   Special Requests BOTTLES DRAWN AEROBIC AND ANAEROBIC 5CC  Final   Culture NO GROWTH 5 DAYS  Final   Report Status 04/16/2016 FINAL  Final  Urine culture     Status: None   Collection Time: 04/11/16  7:32 PM  Result Value Ref Range Status   Specimen Description URINE,  RANDOM  Final   Special Requests NONE  Final   Culture NO GROWTH  Final   Report Status 04/13/2016 FINAL  Final  Culture, blood (Routine x 2)     Status: None   Collection Time: 04/11/16  8:07 PM  Result Value Ref Range Status   Specimen Description BLOOD LEFT HAND  Final   Special Requests IN PEDIATRIC BOTTLE 1CC  Final   Culture NO GROWTH 5 DAYS  Final   Report Status 04/16/2016 FINAL  Final  Urine culture     Status: None   Collection Time: 04/12/16  2:13 AM  Result Value Ref Range Status   Specimen Description URINE, RANDOM  Final   Special Requests NONE  Final   Culture NO GROWTH  Final   Report Status 04/13/2016 FINAL  Final  Culture, blood (Routine X 2) w Reflex to ID Panel     Status: None (Preliminary result)   Collection Time: 04/18/16 12:10 PM  Result Value Ref Range Status   Specimen Description BLOOD RIGHT ANTECUBITAL  Final   Special Requests   Final    BOTTLES DRAWN AEROBIC AND ANAEROBIC 10CC AER 5CC ANA   Culture NO GROWTH 3 DAYS  Final   Report Status  PENDING  Incomplete  Culture, blood (Routine X 2) w Reflex to ID Panel     Status: None (Preliminary result)   Collection Time: 04/18/16 12:31 PM  Result Value Ref Range Status   Specimen Description BLOOD RIGHT HAND  Final   Special Requests BOTTLES DRAWN AEROBIC ONLY 10CC  Final   Culture NO GROWTH 3 DAYS  Final   Report Status PENDING  Incomplete    Studies/Results: No results found.    Assessment/Plan:  INTERVAL HISTORY:  Dr Melvyn Novasrtmann does not feel patient needs a surgical intervention      Principal Problem:   Wound infection (HCC) Active Problems:   Snake bite   Right hand pain   Wrist swelling   Paresthesia   Abscess of finger    Claybon JabsDana L Simonich is a 50 y.o. female with  with puncture wound to finger lead to be ischemic snake bite which did not respond to less than a day of Augmentin but did respond to vancomycin and Zosyn and then sent home with doxycycline with worsening of her wound.  #1 Worsening wound after puncture potentially snake bite:  Concern is for uncontrolled infection at the site  Fortunately she does NOT have osteomyelitis by imaging but she DOES have phlegmonous changes that worry me especially since this has been going on for several weeks and [facility]  Levofloxacin continue to monitor the wound  There has also been concern raised I am hearing for potential secondary gain with re to narcotics ([facility] this [facility]. fi there is concern for ulterior gain and patient manipualting the site would recommend switch to room with a camera  Hold off on repeat MRI for now   LOS: 2 days   Acey LavCornelius Van Dam 04/21/2016, 4:38 PM

## 2016-04-21 NOTE — Progress Notes (Signed)
PROGRESS NOTE    Sabrina [name]  ZOX:[id number] DOB: 04/17/1966 DOA: 04/18/2016 PCP: [name], NP   Outpatient Specialists:     Brief Narrative:  Sabrina [name] is a 50 y.o. female with medical history significant of recent snake bite of finger 2 weeks ago.  Seen and d/c'd from ER on 7/28.  Admitted with subsequent cellulitis on 7/31 and then discharged on 8/3 on doxycycline.  Patient returns to ED with worsening erythema, pain, and cellulitis of finger despite ABx.   Assessment & Plan:   Principal Problem:   Wound infection (HCC) Active Problems:   Snake bite   Right hand pain   Wrist swelling   Paresthesia   Abscess of finger   Cellulitis with phlegmon: Patient failed outpatient therapy (augmentin and doxy).  -levofloxacin per I.D recommendations -MRI with possible phlegmon dorsal to the proximal interphalangeal joint of the index finger.  -elevate extremity -hydrotherapy ordered Spoke with Dr. Orlan Leavens, nothing for him to do at this point-- movement should improve once area has healed -cautious with pain meds- there is a warning on the chart for narcotic seeking behavior in the past  Anxiety. Stable -clonazepam and gabapentin per patient's home regimen.   Bullous pemphigus. Stable, follow as outpatient.    DVT prophylaxis:  SCD's  Code Status: Full Code   Family Communication:   Disposition Plan:     Consultants:   ID  Procedures:   hydrotherapy      Subjective: Patient unable to flex index finger into fist  Objective: Vitals:   04/21/16 0028 04/21/16 0433 04/21/16 0958 04/21/16 1322  BP: (!) 102/59 (!) 92/58 (!) 91/56 (!) 94/55  Pulse: 76 80 83 84  Resp: Temp: 98.2 F (36.8 C) 97.9 F (36.6 C) 98 F (36.7 C) 98 F (36.7 C)  TempSrc: Oral Oral Oral Oral  SpO2: 100% 98% 98% 99%  Weight:      Height:        Intake/Output Summary (Last 24 hours) at 04/21/16 1441 Last data filed at 04/21/16 0029  Gross per 24  hour  Intake              480 ml  Output                0 ml  Net              480 ml   Filed Weights   04/18/16 1436 04/19/16 0349  Weight: 82.6 kg (182 lb) 83.5 kg (184 lb 1.4 oz)    Examination:  General exam: Appears calm and comfortable  Respiratory system: Clear to auscultation. Respiratory effort normal. Cardiovascular system: S1 & S2 heard, RRR. No JVD, murmurs, rubs, gallops or clicks. No pedal edema. Gastrointestinal system: Abdomen is nondistended, soft and nontender. No organomegaly or masses felt. Normal bowel sounds heard. Skin: right index finger with ulcerated lesion proximal, no drainage but irregular boarders Psychiatry: Judgement and insight appear normal. Mood & affect appropriate.     Data Reviewed: I have personally reviewed following labs and imaging studies  CBC:  Recent Labs Lab 04/18/16 1215 04/20/16 0703  WBC 4.4 4.4  NEUTROABS 2.0 2.4  HGB 11.2* 11.8*  HCT 34.9* 34.7*  MCV 94.8 91.6  PLT 177 189   Basic Metabolic Panel:  Recent Labs Lab 04/18/16 1215 04/20/16 0703  NA 140 142  K 3.5 3.7  CL 107 110  CO2 27 24  GLUCOSE 95 101*  BUN 16 14  CREATININE 0.83 0.87  CALCIUM 9.0 9.1   GFR: Estimated Creatinine Clearance: 83.5 mL/min (by C-G formula based on SCr of 0.87 mg/dL). Liver Function Tests:  Recent Labs Lab 04/18/16 1215  AST 24  ALT 17  ALKPHOS 79  BILITOT 0.2*  PROT 6.1*  ALBUMIN 3.6   No results for input(s): LIPASE, AMYLASE in the last 168 hours. No results for input(s): AMMONIA in the last 168 hours. Coagulation Profile: No results for input(s): INR, PROTIME in the last 168 hours. Cardiac Enzymes: No results for input(s): CKTOTAL, CKMB, CKMBINDEX, TROPONINI in the last 168 hours. BNP (last 3 results) No results for input(s): PROBNP in the last 8760 hours. HbA1C: No results for input(s): HGBA1C in the last 72 hours. CBG: No results for input(s): GLUCAP in the last 168 hours. Lipid Profile: No results for  input(s): CHOL, HDL, LDLCALC, TRIG, CHOLHDL, LDLDIRECT in the last 72 hours. Thyroid Function Tests: No results for input(s): TSH, T4TOTAL, FREET4, T3FREE, THYROIDAB in the last 72 hours. Anemia Panel: No results for input(s): VITAMINB12, FOLATE, FERRITIN, TIBC, IRON, RETICCTPCT in the last 72 hours. Urine analysis:    Component Value Date/Time   COLORURINE YELLOW 04/12/2016 0213   APPEARANCEUR CLEAR 04/12/2016 0213   LABSPEC 1.019 04/12/2016 0213   PHURINE 5.5 04/12/2016 0213   GLUCOSEU NEGATIVE 04/12/2016 0213   HGBUR NEGATIVE 04/12/2016 0213   BILIRUBINUR NEGATIVE 04/12/2016 0213   KETONESUR NEGATIVE 04/12/2016 0213   PROTEINUR NEGATIVE 04/12/2016 0213   UROBILINOGEN 0.2 07/05/2015 0627   NITRITE NEGATIVE 04/12/2016 0213   LEUKOCYTESUR SMALL (A) 04/12/2016 0213     ) Recent Results (from the past 240 hour(s))  Culture, blood (Routine x 2)     Status: None   Collection Time: 04/11/16  5:28 PM  Result Value Ref Range Status   Specimen Description BLOOD LEFT HAND  Final   Special Requests BOTTLES DRAWN AEROBIC AND ANAEROBIC 5CC  Final   Culture NO GROWTH 5 DAYS  Final   Report Status 04/16/2016 FINAL  Final  Urine culture     Status: None   Collection Time: 04/11/16  7:32 PM  Result Value Ref Range Status   Specimen Description URINE, RANDOM  Final   Special Requests NONE  Final   Culture NO GROWTH  Final   Report Status 04/13/2016 FINAL  Final  Culture, blood (Routine x 2)     Status: None   Collection Time: 04/11/16  8:07 PM  Result Value Ref Range Status   Specimen Description BLOOD LEFT HAND  Final   Special Requests IN PEDIATRIC BOTTLE 1CC  Final   Culture NO GROWTH 5 DAYS  Final   Report Status 04/16/2016 FINAL  Final  Urine culture     Status: None   Collection Time: 04/12/16  2:13 AM  Result Value Ref Range Status   Specimen Description URINE, RANDOM  Final   Special Requests NONE  Final   Culture NO GROWTH  Final   Report Status 04/13/2016 FINAL  Final    Culture, blood (Routine X 2) w Reflex to ID Panel     Status: None (Preliminary result)   Collection Time: 04/18/16 12:10 PM  Result Value Ref Range Status   Specimen Description BLOOD RIGHT ANTECUBITAL  Final   Special Requests   Final    BOTTLES DRAWN AEROBIC AND ANAEROBIC 10CC AER 5CC ANA   Culture NO GROWTH 2 DAYS  Final   Report Status PENDING  Incomplete  Culture, blood (Routine X 2) w  Reflex to ID Panel     Status: None (Preliminary result)   Collection Time: 04/18/16 12:31 PM  Result Value Ref Range Status   Specimen Description BLOOD RIGHT HAND  Final   Special Requests BOTTLES DRAWN AEROBIC ONLY 10CC  Final   Culture NO GROWTH 2 DAYS  Final   Report Status PENDING  Incomplete      Anti-infectives    Start     Dose/Rate Route Frequency Ordered Stop   04/19/16 1100  levofloxacin (LEVAQUIN) tablet 750 mg     750 mg Oral Daily 04/19/16 0957         Radiology Studies: No results found.      Scheduled Meds: . collagenase   Topical Daily  . gabapentin  800 mg Oral TID  . levofloxacin  750 mg Oral Daily   Continuous Infusions:    LOS: 2 days    Time spent: 25 min    Tattianna Schnarr Juanetta Gosling, DO [facility] Pager [phone]  If 7PM-7AM, please contact night-coverage [email or web address] Password [email or web address] 04/21/2016, 2:41 PM

## 2016-04-21 NOTE — Progress Notes (Signed)
Physical Therapy Wound Treatment Patient Details  Name: Sabrina [name] MRN: [id number] Date of Birth: Jan 01, 1966  Today's Date: 04/21/2016 Time: 2924-4628 Time Calculation (min): 26 min  Subjective  Subjective: Pt reports she doesn't feel like she needs different pain meds. Patient and Family Stated Goals: To use my hand so I can return to my job. Date of Onset: 04/06/16 Prior Treatments: Antibiotics and dressing changes.  Pain Score: Pain Score: 8  Wound Assessment  Wound / Incision (Open or Dehisced) 04/20/16 Other (Comment) Finger (Comment which one) Right open skin area (Active)  Dressing Type Gauze (Comment);Moist to dry 04/21/2016 10:23 AM  Dressing Changed Changed 04/21/2016 10:23 AM  Dressing Status Clean;Dry;Intact 04/21/2016 10:23 AM  Dressing Change Frequency Daily 04/21/2016 10:23 AM  Site / Wound Assessment Brown;Granulation tissue;Painful;Pink;Yellow 04/21/2016 10:23 AM  % Wound base Red or Granulating 40% 04/21/2016 10:23 AM  % Wound base Yellow 60% 04/21/2016 10:23 AM  % Wound base Black 0% 04/21/2016 10:23 AM  % Wound base Other (Comment) 0% 04/21/2016 10:23 AM  Peri-wound Assessment Edema;Erythema (blanchable) 04/21/2016 10:23 AM  Wound Length (cm) 2.7 cm 04/20/2016  2:00 PM  Wound Width (cm) 3.3 cm 04/20/2016  2:00 PM  Wound Depth (cm) 0.1 cm 04/20/2016  2:00 PM  Margins Unattached edges (unapproximated) 04/21/2016 10:23 AM  Closure None 04/21/2016 10:23 AM  Drainage Amount Scant 04/21/2016 10:23 AM  Drainage Description Serous;No odor 04/21/2016 10:23 AM  Treatment Debridement (Selective);Hydrotherapy (Pulse lavage) 04/21/2016 10:23 AM   Hydrotherapy Pulsed lavage therapy - wound location: R Index Finger Pulsed Lavage with Suction (psi): 4 psi (-8) Pulsed Lavage with Suction - Normal Saline Used: 500 mL Pulsed Lavage Tip: Tip with splash shield Selective Debridement Selective Debridement - Location: R Index Finger Selective Debridement - Tools Used: Forceps Selective  Debridement - Tissue Removed: minimal yellow slough   Wound Assessment and Plan  Wound Therapy - Assess/Plan/Recommendations Wound Therapy - Clinical Statement: No significant change. Wound Therapy - Functional Problem List: Yellow Slough, Diminished Sensation, Impaired ROM. Factors Delaying/Impairing Wound Healing: Altered sensation;Immobility (Immobility of R Index Finger) Hydrotherapy Plan: Debridement;Dressing change;Patient/family education;Pulsatile lavage with suction Wound Therapy - Frequency: 6X / week Wound Plan: See Above.  Wound Therapy Goals- Improve the function of patient's integumentary system by progressing the wound(s) through the phases of wound healing (inflammation - proliferation - remodeling) by: Decrease Necrotic Tissue to: 20 Decrease Necrotic Tissue - Progress: Progressing toward goal Increase Granulation Tissue to: 80 Increase Granulation Tissue - Progress: Progressing toward goal  Goals will be updated until maximal potential achieved or discharge criteria met.  Discharge criteria: when goals achieved, discharge from hospital, MD decision/surgical intervention, no progress towards goals, refusal/missing three consecutive treatments without notification or medical reason.  GP     Keyante Durio 04/21/2016, 10:27 AM Suanne Marker PT [phone]

## 2016-04-22 DIAGNOSIS — M79641 Pain in right hand: Secondary | ICD-10-CM

## 2016-04-22 LAB — CBC
HEMATOCRIT: 34.5 % — AB (ref 36.0–46.0)
HEMOGLOBIN: 11.2 g/dL — AB (ref 12.0–15.0)
MCH: 30.6 pg (ref 26.0–34.0)
MCHC: 32.5 g/dL (ref 30.0–36.0)
MCV: 94.3 fL (ref 78.0–100.0)
PLATELETS: 177 10*3/uL (ref 150–400)
RBC: 3.66 MIL/uL — AB (ref 3.87–5.11)
RDW: 13.7 % (ref 11.5–15.5)
WBC: 4.7 10*3/uL (ref 4.0–10.5)

## 2016-04-22 LAB — BASIC METABOLIC PANEL
ANION GAP: 10 (ref 5–15)
BUN: 17 mg/dL (ref 6–20)
CHLORIDE: 106 mmol/L (ref 101–111)
CO2: 25 mmol/L (ref 22–32)
Calcium: 8.9 mg/dL (ref 8.9–10.3)
Creatinine, Ser: 0.86 mg/dL (ref 0.44–1.00)
GFR calc Af Amer: >60 mL/min (ref >60)
GLUCOSE: 104 mg/dL — AB (ref 65–99)
POTASSIUM: 3.5 mmol/L (ref 3.5–5.1)
Sodium: 141 mmol/L (ref 135–145)

## 2016-04-22 NOTE — Progress Notes (Signed)
PROGRESS NOTE    Sabrina [name]  ZOX:[id number] DOB: 07/05/1966 DOA: 04/18/2016 PCP: [name], NP   Outpatient Specialists:     Brief Narrative:  Sabrina [name] is a 50 y.o. female with medical history significant of recent snake bite of finger 2 weeks ago.  Seen and d/c'd from ER on 7/28.  Admitted with subsequent cellulitis on 7/31 and then discharged on 8/3 on doxycycline.  Patient returns to ED with worsening erythema, pain, and cellulitis of finger despite ABx.   Assessment & Plan:   Principal Problem:   Wound infection (HCC) Active Problems:   Snake bite   Right hand pain   Wrist swelling   Paresthesia   Abscess of finger   Cellulitis with phlegmon: Patient failed outpatient therapy (augmentin and doxy).  -levofloxacin per I.D recommendations -MRI with possible phlegmon dorsal to the proximal interphalangeal joint of the index finger.  -elevate extremity -hydrotherapy ordered Spoke with Dr. Orlan Leavens, nothing for him to do at this point-- movement should improve once area has healed -cautious with pain meds- there is a warning on the chart for narcotic seeking behavior in the past  Anxiety. Stable -clonazepam and gabapentin per patient's home regimen.   Bullous pemphigus. Stable, follow as outpatient.    DVT prophylaxis:  SCD's  Code Status: Full Code   Family Communication:   Disposition Plan:  D/c on Sunday if improved?   Consultants:   ID  Procedures:   hydrotherapy      Subjective: Pain improved and per patient area appears to have a "pinker" base  Objective: Vitals:   04/21/16 2158 04/22/16 0219 04/22/16 0518 04/22/16 1045  BP: (!) 97/56 (!) 83/57 (!) 86/57 (!) 93/55  Pulse: 95 97 87 88  Resp: Temp: 98.5 F (36.9 C) 98.4 F (36.9 C) 98 F (36.7 C) 98.1 F (36.7 C)  TempSrc: Oral Oral Oral Oral  SpO2: 97% 97% 97% 98%  Weight:      Height:       No intake or output data in the 24 hours ending 04/22/16  1305 Filed Weights   04/18/16 1436 04/19/16 0349  Weight: 82.6 kg (182 lb) 83.5 kg (184 lb 1.4 oz)    Examination:  General exam: Appears calm and comfortable  Respiratory system: Clear to auscultation. Respiratory effort normal. Cardiovascular system: S1 & S2 heard, RRR. No JVD, murmurs, rubs, gallops or clicks. No pedal edema. Gastrointestinal system: Abdomen is nondistended, soft and nontender. No organomegaly or masses felt. Normal bowel sounds heard. Skin: right index finger wrapped Psychiatry: Judgement and insight appear normal. Mood & affect appropriate.     Data Reviewed: I have personally reviewed following labs and imaging studies  CBC:  Recent Labs Lab 04/18/16 1215 04/20/16 0703 04/22/16 0533  WBC 4.4 4.4 4.7  NEUTROABS 2.0 2.4  --   HGB 11.2* 11.8* 11.2*  HCT 34.9* 34.7* 34.5*  MCV 94.8 91.6 94.3  PLT 177 189 177   Basic Metabolic Panel:  Recent Labs Lab 04/18/16 1215 04/20/16 0703 04/22/16 0533  NA 140 142 141  K 3.5 3.7 3.5  CL 107 110 106  CO2 GLUCOSE 95 101* 104*  BUN CREATININE 0.83 0.87 0.86  CALCIUM 9.0 9.1 8.9   GFR: Estimated Creatinine Clearance: 84.5 mL/min (by C-G formula based on SCr of 0.86 mg/dL). Liver Function Tests:  Recent Labs Lab 04/18/16 1215  AST 24  ALT 17  ALKPHOS 79  BILITOT 0.2*  PROT 6.1*  ALBUMIN 3.6   No results for input(s): LIPASE, AMYLASE in the last 168 hours. No results for input(s): AMMONIA in the last 168 hours. Coagulation Profile: No results for input(s): INR, PROTIME in the last 168 hours. Cardiac Enzymes: No results for input(s): CKTOTAL, CKMB, CKMBINDEX, TROPONINI in the last 168 hours. BNP (last 3 results) No results for input(s): PROBNP in the last 8760 hours. HbA1C: No results for input(s): HGBA1C in the last 72 hours. CBG: No results for input(s): GLUCAP in the last 168 hours. Lipid Profile: No results for input(s): CHOL, HDL, LDLCALC, TRIG, CHOLHDL, LDLDIRECT in  the last 72 hours. Thyroid Function Tests: No results for input(s): TSH, T4TOTAL, FREET4, T3FREE, THYROIDAB in the last 72 hours. Anemia Panel: No results for input(s): VITAMINB12, FOLATE, FERRITIN, TIBC, IRON, RETICCTPCT in the last 72 hours. Urine analysis:    Component Value Date/Time   COLORURINE YELLOW 04/12/2016 0213   APPEARANCEUR CLEAR 04/12/2016 0213   LABSPEC 1.019 04/12/2016 0213   PHURINE 5.5 04/12/2016 0213   GLUCOSEU NEGATIVE 04/12/2016 0213   HGBUR NEGATIVE 04/12/2016 0213   BILIRUBINUR NEGATIVE 04/12/2016 0213   KETONESUR NEGATIVE 04/12/2016 0213   PROTEINUR NEGATIVE 04/12/2016 0213   UROBILINOGEN 0.2 07/05/2015 0627   NITRITE NEGATIVE 04/12/2016 0213   LEUKOCYTESUR SMALL (A) 04/12/2016 0213     ) Recent Results (from the past 240 hour(s))  Culture, blood (Routine X 2) w Reflex to ID Panel     Status: None (Preliminary result)   Collection Time: 04/18/16 12:10 PM  Result Value Ref Range Status   Specimen Description BLOOD RIGHT ANTECUBITAL  Final   Special Requests   Final    BOTTLES DRAWN AEROBIC AND ANAEROBIC 10CC AER 5CC ANA   Culture NO GROWTH 3 DAYS  Final   Report Status PENDING  Incomplete  Culture, blood (Routine X 2) w Reflex to ID Panel     Status: None (Preliminary result)   Collection Time: 04/18/16 12:31 PM  Result Value Ref Range Status   Specimen Description BLOOD RIGHT HAND  Final   Special Requests BOTTLES DRAWN AEROBIC ONLY 10CC  Final   Culture NO GROWTH 3 DAYS  Final   Report Status PENDING  Incomplete      Anti-infectives    Start     Dose/Rate Route Frequency Ordered Stop   04/19/16 1100  levofloxacin (LEVAQUIN) tablet 750 mg     750 mg Oral Daily 04/19/16 0957         Radiology Studies: No results found.      Scheduled Meds: . collagenase   Topical Daily  . gabapentin  800 mg Oral TID  . levofloxacin  750 mg Oral Daily   Continuous Infusions:    LOS: 3 days    Time spent: 15 min    Robin Pafford Juanetta GoslingU Ediberto Sens, DO [facility] Pager [phone]  If 7PM-7AM, please contact night-coverage [email or web address] Password [email or web address] 04/22/2016, 1:05 PM

## 2016-04-22 NOTE — Progress Notes (Signed)
Physical Therapy Wound Treatment Patient Details  Name: Sabrina [name] MRN: [id number] Date of Birth: 07/26/1966  Today's Date: 04/22/2016 Time: 0906-0920 Time Calculation (min): 14 min  Subjective  Subjective: Reports she slept better. Patient and Family Stated Goals: To use my hand so I can return to my job. Date of Onset: 04/06/16 Prior Treatments: Antibiotics and dressing changes.  Pain Score: Pain Score: 7. Pt premedicated.  Wound Assessment  Wound / Incision (Open or Dehisced) 04/20/16 Other (Comment) Finger (Comment which one) Right open skin area (Active)  Dressing Type Gauze (Comment);Moist to dry 04/22/2016 10:57 AM  Dressing Changed Changed 04/22/2016 10:57 AM  Dressing Status Clean;Dry;Intact 04/22/2016 10:57 AM  Dressing Change Frequency Daily 04/22/2016 10:57 AM  Site / Wound Assessment Brown;Granulation tissue;Painful;Pink;Yellow 04/22/2016 10:57 AM  % Wound base Red or Granulating 40% 04/22/2016 10:57 AM  % Wound base Yellow 60% 04/22/2016 10:57 AM  % Wound base Black 0% 04/22/2016 10:57 AM  % Wound base Other (Comment) 0% 04/22/2016 10:57 AM  Peri-wound Assessment Edema;Erythema (blanchable) 04/22/2016 10:57 AM  Wound Length (cm) 2.7 cm 04/20/2016  2:00 PM  Wound Width (cm) 3.3 cm 04/20/2016  2:00 PM  Wound Depth (cm) 0.1 cm 04/20/2016  2:00 PM  Margins Unattached edges (unapproximated) 04/22/2016 10:57 AM  Closure None 04/22/2016 10:57 AM  Drainage Amount Scant 04/22/2016 10:57 AM  Drainage Description Serous;No odor 04/22/2016 10:57 AM  Treatment Hydrotherapy (Pulse lavage) 04/22/2016 10:57 AM   Hydrotherapy Pulsed lavage therapy - wound location: R Index Finger Pulsed Lavage with Suction (psi): 8 psi Pulsed Lavage with Suction - Normal Saline Used: 500 mL Pulsed Lavage Tip: Tip with splash shield Selective Debridement Selective Debridement - Location: rt index finger Selective Debridement - Tools Used: Forceps Selective Debridement - Tissue Removed: Unable to remove any  necrotic tissue   Wound Assessment and Plan  Wound Therapy - Assess/Plan/Recommendations Wound Therapy - Clinical Statement: No significant change. Performed PROM to rt index finger.  Dressing has been intact both days that I have seen pt without signs of manipulation. Wound Therapy - Functional Problem List: Yellow Sl[place], Diminished Sensation, Impaired ROM. Factors Delaying/Impairing Wound Healing: Altered sensation;Immobility (Immobility of R Index Finger) Hydrotherapy Plan: Debridement;Dressing change;Patient/family education;Pulsatile lavage with suction Wound Therapy - Frequency: 6X / week Wound Therapy - Follow Up Recommendations: Other (comment) (Dressing change at home.) Wound Plan: See Above.  Wound Therapy Goals- Improve the function of patient's integumentary system by progressing the wound(s) through the phases of wound healing (inflammation - proliferation - remodeling) by: Decrease Necrotic Tissue to: 20 Decrease Necrotic Tissue - Progress: Progressing toward goal Increase Granulation Tissue to: 80 Increase Granulation Tissue - Progress: Progressing toward goal  Goals will be updated until maximal potential achieved or discharge criteria met.  Discharge criteria: when goals achieved, discharge from hospital, MD decision/surgical intervention, no progress towards goals, refusal/missing three consecutive treatments without notification or medical reason.  GP     Refugia Laneve 04/22/2016, 11:03 AM Suanne Marker PT [phone]

## 2016-04-22 NOTE — Care Management Note (Signed)
Case Management Note  Patient Details  Name: Sabrina JabsDana L Sforza MRN: [id number] Date of Birth: 05/17/1966  Subjective/Objective:                    Action/Plan: Pt receiving hydrotherapy for her finger. CM following for d/c needs.   Expected Discharge Date:                  Expected Discharge Plan:  Home/Self Care  In-House Referral:     Discharge planning Services     Post Acute Care Choice:    Choice offered to:     DME Arranged:    DME Agency:     HH Arranged:    HH Agency:     Status of Service:  In process, will continue to follow  If discussed at Long Length of Stay Meetings, dates discussed:    Additional Comments:  Kermit BaloKelli F Lacara Dunsworth, RN 04/22/2016, 2:02 PM

## 2016-04-22 NOTE — Progress Notes (Signed)
Subjective:  She feels dorsum of hand better, still co of hand swelling  Antibiotics:  Anti-infectives    Start     Dose/Rate Route Frequency Ordered Stop   04/19/16 1100  levofloxacin (LEVAQUIN) tablet 750 mg     750 mg Oral Daily 04/19/16 0957        Medications: Scheduled Meds: . collagenase   Topical Daily  . gabapentin  800 mg Oral TID  . levofloxacin  750 mg Oral Daily   Continuous Infusions:  PRN Meds:.clonazePAM, morphine injection, oxyCODONE, promethazine    Objective: Weight change:  No intake or output data in the 24 hours ending 04/22/16 1931 Blood pressure (!) 101/57, pulse (!) 104, temperature 98 F (36.7 C), temperature source Oral, resp. rate 18, height  (1.626 m), weight 184 lb 1.4 oz (83.5 kg), SpO2 100 %. Temp:  [98 F (36.7 C)-98.5 F (36.9 C)] 98 F (36.7 C) (08/11 1841) Pulse Rate:  [86-104] 104 (08/11 1841) Resp:  [18] 18 (08/11 1841) BP: (83-101)/(55-60) 101/57 (08/11 1841) SpO2:  [97 %-100 %] 100 % (08/11 1841)  Physical Exam:  General: Alert and awake, oriented x3, not in any acute distress. HEENT: anicteric sclera,  EOMI, oropharynx clear and without exudate Cardiovascular: egular rate, normal r,  no murmur rubs or gallops Pulmonary: clear to auscultation bilaterally, no wheezing, rales or rhonchi Gastrointestinal: soft nontender, nondistended, normal bowel sounds, Musculoskeletal: no  clubbing or edema noted bilaterally Skin, soft tissue:  Wound:  04/18/16:          04/19/16:           04/20/16:        04/21/16:     04/22/16:       CBC:   CBC Latest Ref Rng & Units 04/22/2016 04/20/2016 04/18/2016  WBC 4.0 - 10.5 K/uL 4.7 4.4 4.4  Hemoglobin 12.0 - 15.0 g/dL 11.2(L) 11.8(L) 11.2(L)  Hematocrit 36.0 - 46.0 % 34.5(L) 34.7(L) 34.9(L)  Platelets 150 - 400 K/uL 177 189 177     BMET  Recent Labs  04/20/16 0703 04/22/16 0533  NA 142 141  K 3.7 3.5  CL 110 106  CO2 24 25    GLUCOSE 101* 104*  BUN 14 17  CREATININE 0.87 0.86  CALCIUM 9.1 8.9     Liver Panel  No results for input(s): PROT, ALBUMIN, AST, ALT, ALKPHOS, BILITOT, BILIDIR, IBILI in the last 72 hours.     Sedimentation Rate No results for input(s): ESRSEDRATE in the last 72 hours. C-Reactive Protein No results for input(s): CRP in the last 72 hours.  Micro Results: Recent Results (from the past 720 hour(s))  Culture, blood (Routine x 2)     Status: None   Collection Time: 04/11/16  5:28 PM  Result Value Ref Range Status   Specimen Description BLOOD LEFT HAND  Final   Special Requests BOTTLES DRAWN AEROBIC AND ANAEROBIC 5CC  Final   Culture NO GROWTH 5 DAYS  Final   Report Status 04/16/2016 FINAL  Final  Urine culture     Status: None   Collection Time: 04/11/16  7:32 PM  Result Value Ref Range Status   Specimen Description URINE, RANDOM  Final   Special Requests NONE  Final   Culture NO GROWTH  Final   Report Status 04/13/2016 FINAL  Final  Culture, blood (Routine x 2)     Status: None   Collection Time: 04/11/16  8:07 PM  Result Value Ref Range  Status   Specimen Description BLOOD LEFT HAND  Final   Special Requests IN PEDIATRIC BOTTLE 1CC  Final   Culture NO GROWTH 5 DAYS  Final   Report Status 04/16/2016 FINAL  Final  Urine culture     Status: None   Collection Time: 04/12/16  2:13 AM  Result Value Ref Range Status   Specimen Description URINE, RANDOM  Final   Special Requests NONE  Final   Culture NO GROWTH  Final   Report Status 04/13/2016 FINAL  Final  Culture, blood (Routine X 2) w Reflex to ID Panel     Status: None (Preliminary result)   Collection Time: 04/18/16 12:10 PM  Result Value Ref Range Status   Specimen Description BLOOD RIGHT ANTECUBITAL  Final   Special Requests   Final    BOTTLES DRAWN AEROBIC AND ANAEROBIC 10CC AER 5CC ANA   Culture NO GROWTH 4 DAYS  Final   Report Status PENDING  Incomplete  Culture, blood (Routine X 2) w Reflex to ID Panel      Status: None (Preliminary result)   Collection Time: 04/18/16 12:31 PM  Result Value Ref Range Status   Specimen Description BLOOD RIGHT HAND  Final   Special Requests BOTTLES DRAWN AEROBIC ONLY 10CC  Final   Culture NO GROWTH 4 DAYS  Final   Report Status PENDING  Incomplete    Studies/Results: No results found.    Assessment/Plan:  INTERVAL HISTORY:  Dr Melvyn Novasrtmann does not feel patient needs a surgical intervention  Patients finger looks a bit better with more aggressive wound care    Principal Problem:   Wound infection (HCC) Active Problems:   Snake bite   Right hand pain   Wrist swelling   Paresthesia   Abscess of finger    Claybon JabsDana L Lucchesi is a 50 y.o. female with  with puncture wound to finger lead to be ischemic snake bite which did not respond to less than a day of Augmentin but did respond to vancomycin and Zosyn and then sent home with doxycycline with worsening of her wound.  #1 Worsening wound after puncture potentially snake bite:  Concern is for uncontrolled infection at the site  Fortunately she does NOT have osteomyelitis by imaging but she DOES have phlegmonous changes that worry me especially since this has been going on for several weeks and [facility]  Levofloxacin continue to monitor the wound which now looks better to me   Hold off on repeat MRI for now   LOS: 3 days   Acey LavCornelius Van Dam 04/22/2016, 7:31 PM

## 2016-04-23 ENCOUNTER — Encounter (HOSPITAL_COMMUNITY): Payer: Self-pay | Admitting: *Deleted

## 2016-04-23 DIAGNOSIS — T63001S Toxic effect of unspecified snake venom, accidental (unintentional), sequela: Secondary | ICD-10-CM

## 2016-04-23 LAB — CULTURE, BLOOD (ROUTINE X 2)
Culture: NO GROWTH
Culture: NO GROWTH

## 2016-04-23 NOTE — Progress Notes (Signed)
Physical Therapy Wound Treatment Patient Details  Name: Marco L Wiers MRN: [id number] Date of Birth: 04/05/1966  Today's Date: 04/23/2016 Time: 0905-0921 Time Calculation (min): 16 min  Subjective  Subjective: reports it is still hurting some  Pain Score: Pain Score: 5   Wound Assessment  Wound / Incision (Open or Dehisced) 04/20/16 Other (Comment) Finger (Comment which one) Right open skin area (Active)  Dressing Type Gauze (Comment) 04/23/2016  9:24 AM  Dressing Changed Changed 04/23/2016  9:24 AM  Dressing Status Clean;Dry;Intact 04/23/2016  9:24 AM  Dressing Change Frequency Daily 04/23/2016  9:24 AM  Site / Wound Assessment Brown;Granulation tissue;Painful;Pink;Yellow 04/23/2016  9:24 AM  % Wound base Red or Granulating 50% 04/23/2016  9:24 AM  % Wound base Yellow 50% 04/23/2016  9:24 AM  % Wound base Black 0% 04/23/2016  9:24 AM  % Wound base Other (Comment) 0% 04/23/2016  9:24 AM  Peri-wound Assessment Edema;Erythema (blanchable) 04/23/2016  9:24 AM  Wound Length (cm) 2.7 cm 04/20/2016  2:00 PM  Wound Width (cm) 3.3 cm 04/20/2016  2:00 PM  Wound Depth (cm) 0.1 cm 04/20/2016  2:00 PM  Margins Unattached edges (unapproximated) 04/23/2016  9:24 AM  Closure None 04/23/2016  9:24 AM  Drainage Amount Scant 04/23/2016  9:24 AM  Drainage Description Serous;No odor 04/23/2016  9:24 AM  Treatment Hydrotherapy (Pulse lavage) 04/23/2016  9:24 AM  Santyl applied to wound bed prior to applying dressing.  Hydrotherapy Pulsed lavage therapy - wound location: R Index Finger Pulsed Lavage with Suction (psi): 4 psi Pulsed Lavage with Suction - Normal Saline Used: 500 mL Pulsed Lavage Tip: Tip with splash shield   Wound Assessment and Plan  Wound Therapy - Assess/Plan/Recommendations Wound Therapy - Clinical Statement: slight increase in granulation tissue.  Appears some new skin is forming over middle of wound.  Hard, yellow, scab like areas at edges of wound.  Overall, wound progressing nicely.  Do not  feel patient will need follow up hydro at discharge.   Wound Therapy - Functional Problem List: Yellow Slough, Diminished Sensation, Impaired ROM. Factors Delaying/Impairing Wound Healing: Altered sensation;Immobility Hydrotherapy Plan: Dressing change;Patient/family education;Pulsatile lavage with suction;Debridement Wound Therapy - Frequency: 6X / week Wound Therapy - Follow Up Recommendations: Other (comment) (dressing change at home) Wound Plan: See Above.  Wound Therapy Goals- Improve the function of patient's integumentary system by progressing the wound(s) through the phases of wound healing (inflammation - proliferation - remodeling) by: Decrease Necrotic Tissue to: 20 Decrease Necrotic Tissue - Progress: Progressing toward goal Increase Granulation Tissue to: 80 Increase Granulation Tissue - Progress: Progressing toward goal  Goals will be updated until maximal potential achieved or discharge criteria met.  Discharge criteria: when goals achieved, discharge from hospital, MD decision/surgical intervention, no progress towards goals, refusal/missing three consecutive treatments without notification or medical reason.  GP     ,  M 04/23/2016, 9:30 AM  04/23/2016 Margie , PT [phone]     

## 2016-04-23 NOTE — Progress Notes (Signed)
PROGRESS NOTE    Sabrina [name]  ZOX:[id number] DOB: [birth year] DOA: 04/18/2016 PCP: [name], NP   Outpatient Specialists:     Brief Narrative:  Sabrina [name] is a 50 y.o. female with medical history significant of recent snake bite of finger 2 weeks ago.  Seen and d/c'd from ER on 7/28.  Admitted with subsequent cellulitis on 7/31 and then discharged on 8/3 on doxycycline.  Patient returns to ED with worsening erythema, pain, and cellulitis of finger despite ABx.   Assessment & Plan:   Principal Problem:   Wound infection (HCC) Active Problems:   Snake bite   Right hand pain   Wrist swelling   Paresthesia   Abscess of finger   Cellulitis with phlegmon: Patient failed outpatient therapy (augmentin and doxy).  -levofloxacin per I.D recommendations -MRI with possible phlegmon dorsal to the proximal interphalangeal joint of the index finger.  -elevate extremity -hydrotherapy ordered Spoke with Dr. Orlan Leavens, nothing for him to do at this point-- movement should improve once area has healed -cautious with pain meds- there is a warning on the chart for narcotic seeking behavior in the past  Anxiety. Stable -clonazepam and gabapentin per patient's home regimen.   Bullous pemphigus. Stable, follow as outpatient.    DVT prophylaxis:  SCD's  Code Status: Full Code   Family Communication:   Disposition Plan:  D/c on Sunday   Consultants:   ID  Procedures:   hydrotherapy      Subjective: More pain after hydrotherapy yesterday but able to be controlled with PO pain meds  Objective: Vitals:   04/22/16 1841 04/22/16 2202 04/23/16 0201 04/23/16 0700  BP: (!) 101/57 103/73 (!) 97/56 100/63  Pulse: (!) 104 96 90 96  Resp: Temp: 98 F (36.7 C) 98.9 F (37.2 C) 98.4 F (36.9 C) 98.2 F (36.8 C)  TempSrc: Oral Oral Oral Oral  SpO2: 100% 100% 99% 99%  Weight:      Height:       No intake or output data in the 24 hours ending 04/23/16  0848 Filed Weights   04/18/16 1436 04/19/16 0349  Weight: 82.6 kg (182 lb) 83.5 kg (184 lb 1.4 oz)    Examination:  General exam: Appears calm and comfortable  Respiratory system: Clear to auscultation. Respiratory effort normal. Cardiovascular system: S1 & S2 heard, RRR. No JVD, murmurs, rubs, gallops or clicks. No pedal edema. Gastrointestinal system: Abdomen is nondistended, soft and nontender. No organomegaly or masses felt. Normal bowel sounds heard. Skin: right index finger wrapped Psychiatry: Judgement and insight appear normal. Mood & affect appropriate.     Data Reviewed: I have personally reviewed following labs and imaging studies  CBC:  Recent Labs Lab 04/18/16 1215 04/20/16 0703 04/22/16 0533  WBC 4.4 4.4 4.7  NEUTROABS 2.0 2.4  --   HGB 11.2* 11.8* 11.2*  HCT 34.9* 34.7* 34.5*  MCV 94.8 91.6 94.3  PLT 177 189 177   Basic Metabolic Panel:  Recent Labs Lab 04/18/16 1215 04/20/16 0703 04/22/16 0533  NA 140 142 141  K 3.5 3.7 3.5  CL 107 110 106  CO2 GLUCOSE 95 101* 104*  BUN CREATININE 0.83 0.87 0.86  CALCIUM 9.0 9.1 8.9   GFR: Estimated Creatinine Clearance: 84.5 mL/min (by C-G formula based on SCr of 0.86 mg/dL). Liver Function Tests:  Recent Labs Lab 04/18/16 1215  AST 24  ALT 17  ALKPHOS 79  BILITOT 0.2*  PROT 6.1*  ALBUMIN 3.6   No results for input(s): LIPASE, AMYLASE in the last 168 hours. No results for input(s): AMMONIA in the last 168 hours. Coagulation Profile: No results for input(s): INR, PROTIME in the last 168 hours. Cardiac Enzymes: No results for input(s): CKTOTAL, CKMB, CKMBINDEX, TROPONINI in the last 168 hours. BNP (last 3 results) No results for input(s): PROBNP in the last 8760 hours. HbA1C: No results for input(s): HGBA1C in the last 72 hours. CBG: No results for input(s): GLUCAP in the last 168 hours. Lipid Profile: No results for input(s): CHOL, HDL, LDLCALC, TRIG, CHOLHDL, LDLDIRECT in  the last 72 hours. Thyroid Function Tests: No results for input(s): TSH, T4TOTAL, FREET4, T3FREE, THYROIDAB in the last 72 hours. Anemia Panel: No results for input(s): VITAMINB12, FOLATE, FERRITIN, TIBC, IRON, RETICCTPCT in the last 72 hours. Urine analysis:    Component Value Date/Time   COLORURINE YELLOW 04/12/2016 0213   APPEARANCEUR CLEAR 04/12/2016 0213   LABSPEC 1.019 04/12/2016 0213   PHURINE 5.5 04/12/2016 0213   GLUCOSEU NEGATIVE 04/12/2016 0213   HGBUR NEGATIVE 04/12/2016 0213   BILIRUBINUR NEGATIVE 04/12/2016 0213   KETONESUR NEGATIVE 04/12/2016 0213   PROTEINUR NEGATIVE 04/12/2016 0213   UROBILINOGEN 0.2 07/05/2015 0627   NITRITE NEGATIVE 04/12/2016 0213   LEUKOCYTESUR SMALL (A) 04/12/2016 0213     ) Recent Results (from the past 240 hour(s))  Culture, blood (Routine X 2) w Reflex to ID Panel     Status: None (Preliminary result)   Collection Time: 04/18/16 12:10 PM  Result Value Ref Range Status   Specimen Description BLOOD RIGHT ANTECUBITAL  Final   Special Requests   Final    BOTTLES DRAWN AEROBIC AND ANAEROBIC 10CC AER 5CC ANA   Culture NO GROWTH 4 DAYS  Final   Report Status PENDING  Incomplete  Culture, blood (Routine X 2) w Reflex to ID Panel     Status: None (Preliminary result)   Collection Time: 04/18/16 12:31 PM  Result Value Ref Range Status   Specimen Description BLOOD RIGHT HAND  Final   Special Requests BOTTLES DRAWN AEROBIC ONLY 10CC  Final   Culture NO GROWTH 4 DAYS  Final   Report Status PENDING  Incomplete      Anti-infectives    Start     Dose/Rate Route Frequency Ordered Stop   04/19/16 1100  levofloxacin (LEVAQUIN) tablet 750 mg     750 mg Oral Daily 04/19/16 0957         Radiology Studies: No results found.      Scheduled Meds: . collagenase   Topical Daily  . gabapentin  800 mg Oral TID  . levofloxacin  750 mg Oral Daily   Continuous Infusions:    LOS: 4 days    Time spent: 15 min    Carmella Kees Juanetta GoslingU Madysen Faircloth, DO [facility] Pager [phone]  If 7PM-7AM, please contact night-coverage [email or web address] Password [email or web address] 04/23/2016, 8:48 AM

## 2016-04-24 MED ORDER — OXYCODONE HCL 5 MG PO TABS: 5.0000 mg | ORAL_TABLET | ORAL | 0 refills | Status: DC | PRN

## 2016-04-24 MED ORDER — SODIUM CHLORIDE 0.9 % IV BOLUS (SEPSIS)
1000.0000 mL | Freq: Once | INTRAVENOUS | Status: AC
  Administered 2016-04-24: 1000 mL via INTRAVENOUS

## 2016-04-24 MED ORDER — LEVOFLOXACIN 750 MG PO TABS: 750.0000 mg | ORAL_TABLET | Freq: Every day | ORAL | 0 refills | Status: DC

## 2016-04-24 NOTE — Progress Notes (Signed)
Pt's BP read 85/50 at 0220, on call Person ( Dr Joneen Roachrosley) paged and notified, ordered iv bolus of N/S 1000ml, same commenced at 0241, pt reassured, will however continue to monitor. Obasogie-Asidi, Rylea Selway Efe

## 2016-04-24 NOTE — Progress Notes (Signed)
Patient ready for discharge to home; discharge instructions given and reviewed;Rx's given. Patient discharged out via wheelchair; accompanied home by family members.

## 2016-04-24 NOTE — Discharge Summary (Signed)
Physician Discharge Summary  Sabrina [name] OZH:[id number] DOB: 12/10/1965 DOA: 04/18/2016  PCP: [name], NP  Admit date: 04/18/2016 Discharge date: 04/24/2016   Recommendations for Outpatient Follow-Up:   1. Follow up with PCP to examine wound at next appt   Discharge Diagnosis:   Principal Problem:   Wound infection (HCC) Active Problems:   Snake bite   Right hand pain   Wrist swelling    Discharge disposition:  Home.  Discharge Condition: Improved.  Diet recommendation:  Regular.  Wound care: None.   History of Present Illness:   Sabrina [name] is a 50 y.o. female with medical history significant of recent snake bite of finger 2 weeks ago, subsequent cellulitis and admission for this on 7/31, discharged on 8/3 on doxycycline.  Patient returns to ED with worsening erythema, pain, and cellulitis of finger despite ABx.   Hospital Course by Problem:   Cellulitis with phlegmon: Patient failed outpatient therapy (augmentin and doxy).  -levofloxacin per I.D recommendations x 10 more days -MRI with possible phlegmon dorsal to the proximal interphalangeal joint of the index finger.  -elevate extremity -hydrotherapy ordered Spoke with Dr. Orlan Leavens, nothing for him to do at this point-- movement should improve once area has healed -cautious with pain meds- there is a warning on the chart for narcotic seeking behavior in the past  Anxiety. Stable -clonazepam and gabapentin per patient's home regimen.   Bullous pemphigus. Stable    Medical Consultants:    ID  hydrotherapy   Discharge Exam:   Vitals:   04/24/16 0519 04/24/16 1001  BP: (!) 89/53 94/73  Pulse: 85 95  Resp: 18 18  Temp: 98.3 F (36.8 C) 98 F (36.7 C)   Vitals:   04/24/16 0356 04/24/16 0357 04/24/16 0519 04/24/16 1001  BP: 90/60 (!) 90/58 (!) 89/53 94/73  Pulse:  72 85 95  Resp:   18 18  Temp:   98.3 F (36.8 C) 98 F (36.7 C)  TempSrc:   Oral Oral  SpO2:   100% 100%    Weight:      Height:        Gen:  NAD    The results of significant diagnostics from this hospitalization (including imaging, microbiology, ancillary and laboratory) are listed below for reference.     Procedures and Diagnostic Studies:   Mr Hand Right W Wo Contrast  Result Date: 04/19/2016 CLINICAL DATA:  Right hand and wrist pain and swelling after possible snake bite 04/08/2016 to the index finger. Initial encounter. EXAM: MRI OF THE RIGHT HAND WITHOUT AND WITH CONTRAST; MRI OF THE RIGHT WRIST WITHOUT AND WITH CONTRAST TECHNIQUE: Multiplanar, multisequence MR imaging was performed both before and after administration of intravenous contrast. CONTRAST:  15mL MULTIHANCE GADOBENATE DIMEGLUMINE 529 MG/ML IV SOLN COMPARISON:  Radiographs 04/18/2016 FINDINGS: Despite efforts by the technologist and patient, motion artifact is present on today's exam and could not be eliminated. This reduces exam sensitivity and specificity. Bones/Joint/Cartilage Marrow assessment within the index finger is limited by incomplete fat saturation on the T2 weighted and postcontrast images. There is possible mild bone marrow edema within the base of the distal phalanx, best seen on sagittal series 15. There is no cortical destruction, abnormal T1 signal or definite abnormal marrow enhancement. There is no large joint effusion or suspicious synovial enhancement. A small amount of fluid is present in the distal radioulnar joint. As demonstrated radiographically, there are underlying interphalangeal degenerative changes which are most advanced at the second DIP  joint. Lesser degenerative changes are present at the metacarpal phalangeal joints and within the lunate, scaphoid and distal radius. No worrisome carpal bone findings. Ligaments The collateral ligaments within the fingers appear intact. The triangular fibrocartilage complex is intact. There is degeneration of the scapholunate ligament without gross tear. Muscles and  Tendons The flexor and extensor tendons appear intact within the fingers. There is no significant tenosynovitis. Soft tissue Subcutaneous edema is present throughout the index finger, greatest distally. There is an ill-defined fluid collection dorsal to the proximal interphalangeal joint of the index finger, best seen on the sagittal and axial T2 weighted images. This demonstrates no suspicious enhancement following contrast. There is no evidence of foreign body. IMPRESSION: 1. Inflammatory changes within the subcutaneous tissues of the index finger most consistent with cellulitis. Possible phlegmon dorsal to the proximal interphalangeal joint of the index finger. No focal abscess or tenosynovitis demonstrated. 2. No specific signs of osteomyelitis identified. There is possible low-level edema within the base of the second distal phalanx, suboptimally evaluated due to incomplete fat saturation. This could be due to underlying osteoarthritis which is most advanced at the second DIP joint. 3. A preliminary report relaying these findings was generated by Dr. Manus GunningEhinger on 04/19/2016 at 0032 hours. Electronically Signed   By: Carey BullocksWilliam  Veazey M.D.   On: 04/19/2016 08:25   Mr Wrist Right W Wo Contrast  Result Date: 04/19/2016 CLINICAL DATA:  Right hand and wrist pain and swelling after possible snake bite 04/08/2016 to the index finger. Initial encounter. EXAM: MRI OF THE RIGHT HAND WITHOUT AND WITH CONTRAST; MRI OF THE RIGHT WRIST WITHOUT AND WITH CONTRAST TECHNIQUE: Multiplanar, multisequence MR imaging was performed both before and after administration of intravenous contrast. CONTRAST:  15mL MULTIHANCE GADOBENATE DIMEGLUMINE 529 MG/ML IV SOLN COMPARISON:  Radiographs 04/18/2016 FINDINGS: Despite efforts by the technologist and patient, motion artifact is present on today's exam and could not be eliminated. This reduces exam sensitivity and specificity. Bones/Joint/Cartilage Marrow assessment within the index finger is  limited by incomplete fat saturation on the T2 weighted and postcontrast images. There is possible mild bone marrow edema within the base of the distal phalanx, best seen on sagittal series 15. There is no cortical destruction, abnormal T1 signal or definite abnormal marrow enhancement. There is no large joint effusion or suspicious synovial enhancement. A small amount of fluid is present in the distal radioulnar joint. As demonstrated radiographically, there are underlying interphalangeal degenerative changes which are most advanced at the second DIP joint. Lesser degenerative changes are present at the metacarpal phalangeal joints and within the lunate, scaphoid and distal radius. No worrisome carpal bone findings. Ligaments The collateral ligaments within the fingers appear intact. The triangular fibrocartilage complex is intact. There is degeneration of the scapholunate ligament without gross tear. Muscles and Tendons The flexor and extensor tendons appear intact within the fingers. There is no significant tenosynovitis. Soft tissue Subcutaneous edema is present throughout the index finger, greatest distally. There is an ill-defined fluid collection dorsal to the proximal interphalangeal joint of the index finger, best seen on the sagittal and axial T2 weighted images. This demonstrates no suspicious enhancement following contrast. There is no evidence of foreign body. IMPRESSION: 1. Inflammatory changes within the subcutaneous tissues of the index finger most consistent with cellulitis. Possible phlegmon dorsal to the proximal interphalangeal joint of the index finger. No focal abscess or tenosynovitis demonstrated. 2. No specific signs of osteomyelitis identified. There is possible low-level edema within the base of the second distal  phalanx, suboptimally evaluated due to incomplete fat saturation. This could be due to underlying osteoarthritis which is most advanced at the second DIP joint. 3. A preliminary  report relaying these findings was generated by Dr. Manus Gunning on 04/19/2016 at 0032 hours. Electronically Signed   By: Carey Bullocks M.D.   On: 04/19/2016 08:25   Dg Hand Complete Right  Result Date: 04/18/2016 CLINICAL DATA:  Snake bite to posterior index finger 2 weeks ago, still has an open wound, question osteomyelitis EXAM: RIGHT HAND - COMPLETE 3+ VIEW COMPARISON:  None FINDINGS: Mild diffuse osseous demineralization questioned. Scattered soft tissue swelling throughout RIGHT hand. Degenerative changes at scattered DIP joints. No acute fracture, dislocation or bone destruction. IMPRESSION: No acute osseous abnormalities. Diffuse soft tissue swelling RIGHT hand with degenerative changes at DIP joints. Electronically Signed   By: Ulyses Southward M.D.   On: 04/18/2016 11:42     Labs:   Basic Metabolic Panel:  Recent Labs Lab 04/18/16 1215 04/20/16 0703 04/22/16 0533  NA 140 142 141  K 3.5 3.7 3.5  CL 107 110 106  CO2 27 24 25   GLUCOSE 95 101* 104*  BUN 16 14 17   CREATININE 0.83 0.87 0.86  CALCIUM 9.0 9.1 8.9   GFR Estimated Creatinine Clearance: 84.5 mL/min (by C-G formula based on SCr of 0.86 mg/dL). Liver Function Tests:  Recent Labs Lab 04/18/16 1215  AST 24  ALT 17  ALKPHOS 79  BILITOT 0.2*  PROT 6.1*  ALBUMIN 3.6   No results for input(s): LIPASE, AMYLASE in the last 168 hours. No results for input(s): AMMONIA in the last 168 hours. Coagulation profile No results for input(s): INR, PROTIME in the last 168 hours.  CBC:  Recent Labs Lab 04/18/16 1215 04/20/16 0703 04/22/16 0533  WBC 4.4 4.4 4.7  NEUTROABS 2.0 2.4  --   HGB 11.2* 11.8* 11.2*  HCT 34.9* 34.7* 34.5*  MCV 94.8 91.6 94.3  PLT 177 189 177   Cardiac Enzymes: No results for input(s): CKTOTAL, CKMB, CKMBINDEX, TROPONINI in the last 168 hours. BNP: Invalid input(s): POCBNP CBG: No results for input(s): GLUCAP in the last 168 hours. D-Dimer No results for input(s): DDIMER in the last 72  hours. Hgb A1c No results for input(s): HGBA1C in the last 72 hours. Lipid Profile No results for input(s): CHOL, HDL, LDLCALC, TRIG, CHOLHDL, LDLDIRECT in the last 72 hours. Thyroid function studies No results for input(s): TSH, T4TOTAL, T3FREE, THYROIDAB in the last 72 hours.  Invalid input(s): FREET3 Anemia work up No results for input(s): VITAMINB12, FOLATE, FERRITIN, TIBC, IRON, RETICCTPCT in the last 72 hours. Microbiology Recent Results (from the past 240 hour(s))  Culture, blood (Routine X 2) w Reflex to ID Panel     Status: None   Collection Time: 04/18/16 12:10 PM  Result Value Ref Range Status   Specimen Description BLOOD RIGHT ANTECUBITAL  Final   Special Requests   Final    BOTTLES DRAWN AEROBIC AND ANAEROBIC 10CC AER 5CC ANA   Culture NO GROWTH 5 DAYS  Final   Report Status 04/23/2016 FINAL  Final  Culture, blood (Routine X 2) w Reflex to ID Panel     Status: None   Collection Time: 04/18/16 12:31 PM  Result Value Ref Range Status   Specimen Description BLOOD RIGHT HAND  Final   Special Requests BOTTLES DRAWN AEROBIC ONLY 10CC  Final   Culture NO GROWTH 5 DAYS  Final   Report Status 04/23/2016 FINAL  Final  Discharge Instructions:   Discharge Instructions    Call MD for:  temperature >100.4    Complete by:  As directed   Diet general    Complete by:  As directed   Discharge instructions    Complete by:  As directed   No driving while taking pain meds Cover wound with bandaid (sticky on all 4 sides)   Increase activity slowly    Complete by:  As directed       Medication List    STOP taking these medications   doxycycline 100 MG tablet Commonly known as:  VIBRA-TABS   GOODY HEADACHE PO   mupirocin cream 2 % Commonly known as:  BACTROBAN     TAKE these medications   clonazePAM 0.5 MG tablet Commonly known as:  KLONOPIN Take 0.5 mg by mouth 2 (two) times daily as needed for anxiety.   gabapentin 400 MG capsule Commonly known as:   NEURONTIN Take 2 capsules (800 mg total) by mouth 3 (three) times daily. TAKE 2 CAPSULES BY MOUTH 3 TIMES DAILY   levofloxacin 750 MG tablet Commonly known as:  LEVAQUIN Take 1 tablet (750 mg total) by mouth daily.   oxyCODONE 5 MG immediate release tablet Commonly known as:  Oxy IR/ROXICODONE Take 1 tablet (5 mg total) by mouth every 4 (four) hours as needed for severe pain.   promethazine 12.5 MG tablet Commonly known as:  PHENERGAN Take 1 tablet (12.5 mg total) by mouth every 6 (six) hours as needed for nausea or vomiting.      Follow-up Information    [name], NP .   Specialty:  Family Medicine Why:  has an appointment on 8/22 Contact information: [address] [place] [address] [phone]            Time coordinating discharge: 35 min  Signed:  Quantisha Marsicano Juanetta Gosling   [facility] 04/24/2016, 3:00 PM

## 2016-04-26 ENCOUNTER — Telehealth: Payer: Self-pay

## 2016-04-27 ENCOUNTER — Telehealth: Payer: Self-pay | Admitting: Hematology

## 2016-04-27 NOTE — Telephone Encounter (Signed)
Patient is following up to a call she placed to us on yesterday. / I advised that the provider could not prescribed tramadol without seeing patient first.  Patient has an appointment on 05/03/2016 and the provider will address this at that visit. / Patient then states that she feels like her infection in the finger is getting worse, she can not move fingers, and she simply can not preform her job. / I advised the patient that is she feels she is getting worse, and can not wait until her office visit then she could go to the urgent care or ER. / Patient verbalizes understanding

## 2016-04-27 NOTE — Telephone Encounter (Signed)
I cannot refill her Tramadol.

## 2016-04-29 MED FILL — clonazePAM 0.5 MG TABS: 0.5 | 30 days supply | Qty: 60 | Fill #0

## 2016-05-03 ENCOUNTER — Encounter: Payer: Self-pay | Admitting: Family Medicine

## 2016-05-03 ENCOUNTER — Ambulatory Visit (INDEPENDENT_AMBULATORY_CARE_PROVIDER_SITE_OTHER): Payer: Self-pay | Admitting: Family Medicine

## 2016-05-03 VITALS — BP 92/64 | HR 79 | Temp 98.5°F | Resp 18 | Ht 64.0 in | Wt 189.0 lb

## 2016-05-03 DIAGNOSIS — Z0000 Encounter for general adult medical examination without abnormal findings: Secondary | ICD-10-CM

## 2016-05-03 MED ORDER — NAPROXEN 500 MG PO TABS: 500.0000 mg | ORAL_TABLET | Freq: Two times a day (BID) | ORAL | 1 refills | Status: DC

## 2016-05-03 NOTE — Progress Notes (Signed)
Patient is here for HFU  Patient complains of right hand fine pain being present pain in the right hand.  Patient has taken medication today.

## 2016-05-03 NOTE — Patient Instructions (Signed)
Call and make an appointment with wound dr today.

## 2016-05-04 ENCOUNTER — Telehealth: Payer: Self-pay | Admitting: *Deleted

## 2016-05-04 NOTE — Progress Notes (Signed)
Sabrina LabradorDana [name], is a 50 y.o. female  WUJ:[id number]:[id number]  NFA:[id number]  DOB - 12/24/1965  CC:  Chief Complaint  Patient presents with  . Hospitalization Follow-up       HPI: Sabrina LabradorDana Halleck is a 50 y.o. female here to follow-up on recent hospital (ED and inpatient) visits for a presumed snake to her right forefinger, with resultant cellulitis. This  Has been treated with oral and IV antibiotics. She is to follow-up with a specialist soon but was told to follow-up here also. She reports her infections is improved but she still reports significant pain in her finger. She was prescribed a narcotic when she left the hospital. She reports that is too strong and she would like some Tramadol. She has been taking the narcotic pain medication off and on for some time and it should not be causing her drowsiness. She has a history of drug seeking behaviors prior to this event. I have declines to prescribe any controlled substances. She is in need of influenza vaccine today.  Allergies  Allergen Reactions  . Zofran [Ondansetron Hcl] Hives and Other (See Comments)    Spots around iv site  . Droperidol Palpitations  . Morphine And Related Itching  . Tessalon [Benzonatate] Other (See Comments)    Watery eyes   Past Medical History:  Diagnosis Date  . Arthritis   . Bullous pemphigus   . Cat bite 06/2014   to left elbow  . History of blood transfusion 1988   "when I had my baby"  . 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 Poisoning, snake bite 04/08/2016   "[place]; RUE"  . PONV (postoperative nausea and vomiting)   . Situational anxiety   . Staph infection    Current Outpatient Prescriptions on File Prior to Visit  Medication Sig Dispense Refill  . clonazePAM (KLONOPIN) 0.5 MG tablet Take 0.5 mg by mouth 2 (two) times daily as needed for anxiety.   1  . gabapentin (NEURONTIN) 400 MG capsule Take 2 capsules (800 mg total) by mouth 3 (three) times daily. TAKE 2 CAPSULES BY MOUTH 3 TIMES DAILY 180  capsule 0  . levofloxacin (LEVAQUIN) 750 MG tablet Take 1 tablet (750 mg total) by mouth daily. 10 tablet 0  . oxyCODONE (OXY IR/ROXICODONE) 5 MG immediate release tablet Take 1 tablet (5 mg total) by mouth every 4 (four) hours as needed for severe pain. 15 tablet 0  . promethazine (PHENERGAN) 12.5 MG tablet Take 1 tablet (12.5 mg total) by mouth every 6 (six) hours as needed for nausea or vomiting. 30 tablet 0   No current facility-administered medications on file prior to visit.    Family History  Problem Relation Age of Onset  . Liver disease Mother   . Dementia Mother   . Cancer Mother   . Cancer Maternal Grandmother    Social History   Social History  . Marital status: Divorced    Spouse name: N/A  . Number of children: N/A  . Years of education: N/A   Occupational History  . Not on file.   Social History Main Topics  . Smoking status: Never Smoker  . Smokeless tobacco: Never Used  . Alcohol use 0.0 oz/week     Comment: 04/11/2016 "I'll have a glass of wine q 2-3 months"  . Drug use: No  . Sexual activity: Not Currently   Other Topics Concern  . Not on file   Social History Narrative  . No narrative on file  Review of Systems: Constitutional: Negative for fever, chills, appetite change, weight loss,  Fatigue. Skin: Positive for redness and pain in right pointer finger. HENT: Negative for ear pain, ear discharge.nose bleeds Eyes: Negative for pain, discharge, redness, itching and visual disturbance. Neck: Negative for pain, stiffness Respiratory: Negative for cough, shortness of breath,   Cardiovascular: Negative for chest pain, palpitations and leg swelling. Gastrointestinal: Negative for abdominal pain, nausea, vomiting, diarrhea, constipations Genitourinary: Negative for dysuria, urgency, frequency, hematuria,  Musculoskeletal: Negative for back pain, joint pain, joint  swelling, and gait problem.Negative for weakness. Neurological: Negative for dizziness,  tremors, seizures, syncope,   light-headedness, numbness and headaches.  Hematological: Negative for easy bruising or bleeding Psychiatric/Behavioral: Negative for depression, anxiety, decreased concentration, confusion   Objective:   Vitals:   05/03/16 1142  BP: 92/64  Pulse: 79  Resp: 18  Temp: 98.5 F (36.9 C)    Physical Exam: Constitutional: Patient appears well-developed and well-nourished. No distress. HENT: Normocephalic, atraumatic, External right and left ear normal. Oropharynx is clear and moist.  Eyes: Conjunctivae and EOM are normal. PERRLA, no scleral icterus. Neck: Normal ROM. Neck supple. No lymphadenopathy, No thyromegaly. CVS: RRR, S1/S2 +, no murmurs, no gallops, no rubs Pulmonary: Effort and breath sounds normal, no stridor, rhonchi, wheezes, rales.  Abdominal: Soft. Normoactive BS,, no distension, tenderness, rebound or guarding.  Musculoskeletal: Normal range of motion. No edema and no tenderness.  Neuro: Alert.Normal muscle tone coordination. Non-focal Skin: Skin is warm and dry. No rash noted. Not diaphoretic. There is erythema and two healing lesions on her right pointer finger. There is minimal if any swelling and no open areas and no drainage. Psychiatric: Normal mood and affect. Behavior, judgment, thought content normal.  Lab Results  Component Value Date   WBC 4.7 04/22/2016   HGB 11.2 (L) 04/22/2016   HCT 34.5 (L) 04/22/2016   MCV 94.3 04/22/2016   PLT 177 04/22/2016   Lab Results  Component Value Date   CREATININE 0.86 04/22/2016   BUN 17 04/22/2016   NA 141 04/22/2016   K 3.5 04/22/2016   CL 106 04/22/2016   CO2 25 04/22/2016    Lab Results  Component Value Date   HGBA1C  07/05/2015    QUESTIONABLE IDENTIFICATION / INCORRECTLY LABELED SPECIMEN   Lipid Panel     Component Value Date/Time   CHOL 237 (H) 04/27/2015 1449   TRIG 144 04/27/2015 1449   HDL 70 04/27/2015 1449   CHOLHDL 3.4 04/27/2015 1449   VLDL 29 04/27/2015 1449    LDLCALC 138 (H) 04/27/2015 1449       Assessment and plan:   1. Healthcare maintenance  - Flu Vaccine QUAD 36+ mos PF IM (Fluarix & Fluzone Quad PF)   2. Finger wound -Follow-up with specialist as planned.   The patient was given clear instructions to go to ER or return to medical center if symptoms don't improve, worsen or new problems develop. The patient verbalized understanding.    Henrietta HooverLinda C Kierah Goatley FNP  05/04/2016, 1:07 PM

## 2016-05-04 NOTE — Telephone Encounter (Signed)
Patient called and advised she saw Dr Daiva EvesVan Dam in the hospital and she is still having pain and swelling in the finger where she has the snake bite. She advised she would like tramadol for the pain and not Oxy 5 mg as it makes her sleepy and she can not work.   Advised the patient since we have not seen her in the office she is not a patient here but that since she saw the doctor in the hospital will forward a message to him and he may respond but he is out of the office until 05/09/16 and we usually do not do pain medication. Advised her someone will call he back once the doctor responds.

## 2016-05-08 NOTE — Telephone Encounter (Signed)
She has well documented hx of seeking controlled substances from mx providers--see pCP note. I will NOT rx narcotics to new pts anyway. I also will not see her for followup unless PCP insists and I personally think a biopsy of her finger by dermatologist might be prerable if she needs a new intervention. There was even concern there could be secondary gain. PS while she was in the hospital her finger wound improved when dressed and undressed by wound care. I am worried she may be manipulating wound

## 2016-05-09 MED FILL — GABAPENTIN 300 MG CAPSULE: 300 | 30 days supply | Qty: 180 | Fill #2

## 2016-06-07 ENCOUNTER — Other Ambulatory Visit: Payer: Self-pay | Admitting: Family Medicine

## 2016-06-07 DIAGNOSIS — G609 Hereditary and idiopathic neuropathy, unspecified: Secondary | ICD-10-CM

## 2016-06-07 MED FILL — GABAPENTIN 300 MG CAPSULE: 300 | 15 days supply | Qty: 90 | Fill #3

## 2016-06-13 ENCOUNTER — Telehealth: Payer: Self-pay

## 2016-06-13 DIAGNOSIS — G609 Hereditary and idiopathic neuropathy, unspecified: Secondary | ICD-10-CM

## 2016-06-13 MED ORDER — GABAPENTIN 300 MG PO CAPS: ORAL_CAPSULE | ORAL | 3 refills | Status: DC

## 2016-06-13 MED FILL — PHENTERMINE 37.5 MG TABLET: 37.5 | 30 days supply | Qty: 30 | Fill #0

## 2016-06-13 NOTE — Telephone Encounter (Signed)
Refill for gabapentin sent into pharmacy. Thanks!  

## 2016-06-20 MED FILL — GABAPENTIN 300 MG CAPSULE: 300 | 30 days supply | Qty: 180 | Fill #0

## 2016-07-22 ENCOUNTER — Ambulatory Visit: Payer: Self-pay | Admitting: Family Medicine

## 2016-07-22 MED FILL — GABAPENTIN 300 MG CAPSULE: 300 | 30 days supply | Qty: 180 | Fill #1

## 2016-08-17 MED FILL — GABAPENTIN 300 MG CAPSULE: 300 | 30 days supply | Qty: 180 | Fill #2

## 2016-08-29 ENCOUNTER — Ambulatory Visit: Payer: Self-pay | Admitting: Family Medicine

## 2016-08-30 ENCOUNTER — Emergency Department (HOSPITAL_COMMUNITY): Payer: Self-pay

## 2016-08-30 ENCOUNTER — Emergency Department (HOSPITAL_COMMUNITY)
Admission: EM | Admit: 2016-08-30 | Discharge: 2016-08-30 | Disposition: A | Discharge status: Home / Self Care | Payer: Self-pay | Attending: Emergency Medicine | Admitting: Emergency Medicine

## 2016-08-30 ENCOUNTER — Encounter (HOSPITAL_COMMUNITY): Payer: Self-pay

## 2016-08-30 DIAGNOSIS — Y929 Unspecified place or not applicable: Secondary | ICD-10-CM | POA: Insufficient documentation

## 2016-08-30 DIAGNOSIS — S1195XA Open bite of unspecified part of neck, initial encounter: Secondary | ICD-10-CM | POA: Insufficient documentation

## 2016-08-30 DIAGNOSIS — Z79899 Other long term (current) drug therapy: Secondary | ICD-10-CM | POA: Insufficient documentation

## 2016-08-30 DIAGNOSIS — W5501XA Bitten by cat, initial encounter: Secondary | ICD-10-CM | POA: Insufficient documentation

## 2016-08-30 DIAGNOSIS — Y939 Activity, unspecified: Secondary | ICD-10-CM | POA: Insufficient documentation

## 2016-08-30 DIAGNOSIS — Y999 Unspecified external cause status: Secondary | ICD-10-CM | POA: Insufficient documentation

## 2016-08-30 LAB — BASIC METABOLIC PANEL
Anion gap: 6 (ref 5–15)
BUN: 17 mg/dL (ref 6–20)
CALCIUM: 9 mg/dL (ref 8.9–10.3)
CHLORIDE: 109 mmol/L (ref 101–111)
CO2: 25 mmol/L (ref 22–32)
CREATININE: 0.87 mg/dL (ref 0.44–1.00)
Glucose, Bld: 86 mg/dL (ref 65–99)
Potassium: 4.2 mmol/L (ref 3.5–5.1)
SODIUM: 140 mmol/L (ref 135–145)

## 2016-08-30 LAB — CBC WITH DIFFERENTIAL/PLATELET
BASOS ABS: 0 10*3/uL (ref 0.0–0.1)
BASOS PCT: 0 %
EOS ABS: 0.1 10*3/uL (ref 0.0–0.7)
EOS PCT: 3 %
HCT: 39.7 % (ref 36.0–46.0)
HEMOGLOBIN: 13.4 g/dL (ref 12.0–15.0)
Lymphocytes Relative: 46 %
Lymphs Abs: 1.8 10*3/uL (ref 0.7–4.0)
MCH: 31.5 pg (ref 26.0–34.0)
MCHC: 33.8 g/dL (ref 30.0–36.0)
MCV: 93.4 fL (ref 78.0–100.0)
Monocytes Absolute: 0.5 10*3/uL (ref 0.1–1.0)
Monocytes Relative: 11 %
NEUTROS PCT: 40 %
Neutro Abs: 1.6 10*3/uL — ABNORMAL LOW (ref 1.7–7.7)
PLATELETS: 222 10*3/uL (ref 150–400)
RBC: 4.25 MIL/uL (ref 3.87–5.11)
RDW: 14.2 % (ref 11.5–15.5)
WBC: 4 10*3/uL (ref 4.0–10.5)

## 2016-08-30 MED ORDER — IOPAMIDOL (ISOVUE-300) INJECTION 61%
75.0000 mL | Freq: Once | INTRAVENOUS | Status: AC | PRN
  Administered 2016-08-30: 75 mL via INTRAVENOUS

## 2016-08-30 MED ORDER — AMOXICILLIN-POT CLAVULANATE 875-125 MG PO TABS: 1.0000 | ORAL_TABLET | Freq: Two times a day (BID) | ORAL | 0 refills | Status: DC

## 2016-08-30 MED ORDER — AMOXICILLIN-POT CLAVULANATE 875-125 MG PO TABS
1.0000 | ORAL_TABLET | Freq: Once | ORAL | Status: AC
  Administered 2016-08-30: 1 via ORAL
  Filled 2016-08-30: qty 1

## 2016-08-30 MED ORDER — IBUPROFEN 200 MG PO TABS
600.0000 mg | ORAL_TABLET | Freq: Once | ORAL | Status: AC
  Administered 2016-08-30: 600 mg via ORAL
  Filled 2016-08-30: qty 3

## 2016-08-30 MED ORDER — HYDROCODONE-ACETAMINOPHEN 5-325 MG PO TABS: 2.0000 | ORAL_TABLET | ORAL | 0 refills | Status: DC | PRN

## 2016-08-30 MED ORDER — IOPAMIDOL (ISOVUE-300) INJECTION 61%
INTRAVENOUS | Status: AC
  Filled 2016-08-30: qty 75

## 2016-08-30 MED FILL — HYDROCODON-APAP 5-325: 5-325 | 1 days supply | Qty: 8 | Fill #0

## 2016-08-30 NOTE — Discharge Instructions (Signed)
Please continue to use ice to help with swelling and heat to help with pain. You need to take the antibiotics 2 times per day for 7 days. Please follow up with primary care doctor or return to the ED in 2 days for wound recheck. Return sooner if symptoms worsen.

## 2016-08-30 NOTE — Progress Notes (Signed)
[facility] x 5 and admissions x 5 in last 6 months [facility] May and August 2017 L [name] [facility]  MDs please review FYI for percautions

## 2016-08-30 NOTE — ED Notes (Signed)
PT DISCHARGED. INSTRUCTIONS AND PRESCRIPTIONS GIVEN. AAOX4. PT IN NO APPARENT DISTRESS. THE OPPORTUNITY TO ASK QUESTIONS WAS PROVIDED. 

## 2016-08-30 NOTE — ED Notes (Signed)
ED Provider at bedside. 

## 2016-08-30 NOTE — ED Notes (Signed)
Unsuccessful IV attempt x1. IV team consult placed. 

## 2016-08-30 NOTE — ED Notes (Signed)
I attempted to collect labs and was unsuccessful. 

## 2016-08-30 NOTE — ED Triage Notes (Signed)
Patient reports a cat bite that occurred in the left lateral neck 2 days ago. Patient is having increased pain and swelling. Area has been draining. And patient has been having a fever at home.

## 2016-08-30 NOTE — ED Provider Notes (Signed)
[facility] Provider Note   CSN: [id number] Arrival date & time: 08/30/16  0910     History   Chief Complaint Chief Complaint  Patient presents with  . Animal Bite    HPI Sabrina [name] is a 50 y.o. female.  50 year old Caucasian female with no significant past medical history presents to the ED today with complaint of cat bite to her left lateral neck. Patient states she was bit by a cat approximately 2 days ago. States that the pain and swelling has increased since. She is using warm compresses and ibuprofen and Tylenol for the pain and swelling. She also notes that the area has been draining white discharge that started yesterday. She also endorses a fever of 102.3 yesterday. She did take ibuprofen this morning for her fever. Patient has history of cat bite and snakebite that required hospitalization with IV antibiotics and surgical interventions. The pain was acute in onset. Nothing makes better or worse. Gradually worsening. Describes as a dull ache. Denies any headache, vision changes, numbness, dizziness, cough, chest pain, shortness of breath, abdominal pain, nausea, emesis, urinary symptoms, change in bowel habits, numbness/tingling.  Tdap is up to date within the past year. 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 States that cat was up to date on rabies vaccination. She also states she has been vaccinated for rabies in the past year.    Animal Bite  Associated symptoms: fever     Past Medical History:  Diagnosis Date  . Arthritis   . Bullous pemphigus   . Cat bite 06/2014   to left elbow  . History of blood transfusion 1988   "when I had my baby"  . 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 Poisoning, snake bite 04/08/2016   "[place]; RUE"  . PONV (postoperative nausea and vomiting)   . Situational anxiety   . Staph infection     Patient Active Problem List   Diagnosis Date Noted  . Right hand pain   . Wrist swelling   . Paresthesia   . Abscess of finger     . Cellulitis of right upper extremity 04/11/2016  . Cellulitis 04/11/2016  . Snake bite   . Medical non-compliance 10/05/2015  . Elbow pain 07/20/2015  . Anxiety state 07/20/2015  . Wound infection 07/04/2015  . Dehydration 07/04/2015  . Tachycardia 07/04/2015  . Diarrhea 07/04/2015  . Wound infection after surgery 07/04/2015  . 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HCC)   . Cat bite of forearm 07/01/2015  . Skin ulcer of upper arm, limited to breakdown of skin (HCC) 07/01/2015  . Cellulitis of upper arm 03/10/2015    Past Surgical History:  Procedure Laterality Date  . APPENDECTOMY  ~ 1987  . APPLICATION OF A-CELL OF EXTREMITY Left 08/05/2015   Procedure: APPLICATION OF A-CELL OF EXTREMITY;  Surgeon: Peggye Formlaire S Dillingham, DO;  Location: [facility];  Service: Plastics;  Laterality: Left;  . BREAST SURGERY Right 1990   "mild duct taken out"  . DEBRIDEMENT AND CLOSURE WOUND Left 07/01/2015   Procedure: LEFT ELBOW EXCISION OF WOUND WITH PRIMARY CLOSURE 2X5 CM ;  Surgeon: Peggye Formlaire S Dillingham, DO;  Location: [facility];  Service: Plastics;  Laterality: Left;  . ELBOW SURGERY  X 23 in [place] <06/2015   from a cat bite; all I&D  . I&D EXTREMITY Left 07/08/2015   Procedure: IRRIGATION AND DEBRIDEMENT EXTREMITY, DRAINAGE OF LEFT ARM WOUND, A-CELL PLACEMENT, WOUND VAC PLACEMENT;  Surgeon: Alena Billslaire S Dillingham, DO;  Location:  [facility];  Service: Plastics;  Laterality: Left;  . INCISION AND DRAINAGE OF WOUND Left 08/05/2015   Procedure: IRRIGATION AND DEBRIDEMENT LEFT ELBOW WOUND, PLACEMENT OF ACELL;  Surgeon: Peggye Formlaire S Dillingham, DO;  Location: [facility];  Service: Plastics;  Laterality: Left;  . LAPAROSCOPIC CHOLECYSTECTOMY  1998  . SKIN GRAFT Left 2016   took from anterior thigh; placed at elbow  . TONSILLECTOMY  ~ 2000  . TOTAL ABDOMINAL HYSTERECTOMY  2003    OB History    No data available       Home Medications    Prior to Admission medications    Medication Sig Start Date End Date Taking? Authorizing Provider  acetaminophen (TYLENOL) 500 MG tablet Take 1,000 mg by mouth every 6 (six) hours as needed for mild pain, moderate pain, fever or headache.   Yes Historical Provider, MD  azithromycin (ZITHROMAX) 250 MG tablet Take 250-500 mg by mouth daily. Started 12/17 for 5 days - takes 500 mg on day 1, then 250 mg for 4 days   Yes Historical Provider, MD  clonazePAM (KLONOPIN) 0.5 MG tablet Take 0.5 mg by mouth 2 (two) times daily as needed for anxiety.  01/05/16  Yes Historical Provider, MD  gabapentin (NEURONTIN) 300 MG capsule TAKE 2 CAPSULES BY MOUTH 3 TIMES DAILY Patient taking differently: Take 600 mg by mouth 3 (three) times daily.  06/13/16  Yes Henrietta HooverLinda C [name], NP  levofloxacin (LEVAQUIN) 750 MG tablet Take 1 tablet (750 mg total) by mouth daily. Patient not taking: Reported on 08/30/2016 04/24/16   Joseph ArtJessica U Vann, DO  naproxen (NAPROSYN) 500 MG tablet Take 1 tablet (500 mg total) by mouth 2 (two) times daily with a meal. Patient not taking: Reported on 08/30/2016 05/03/16   Henrietta HooverLinda C [name], NP  oxyCODONE (OXY IR/ROXICODONE) 5 MG immediate release tablet Take 1 tablet (5 mg total) by mouth every 4 (four) hours as needed for severe pain. Patient not taking: Reported on 08/30/2016 04/24/16   Joseph ArtJessica U Vann, DO  promethazine (PHENERGAN) 12.5 MG tablet Take 1 tablet (12.5 mg total) by mouth every 6 (six) hours as needed for nausea or vomiting. Patient not taking: Reported on 08/30/2016 04/14/16   Ripudeep Jenna LuoK Rai, MD    Family History Family History  Problem Relation Age of Onset  . Liver disease Mother   . Dementia Mother   . Cancer Mother   . Cancer Maternal Grandmother     Social History Social History  Substance Use Topics  . Smoking status: Never Smoker  . Smokeless tobacco: Never Used  . Alcohol use 0.0 oz/week     Comment: 04/11/2016 "I'll have a glass of wine q 2-3 months"     Allergies   Zofran [ondansetron hcl];  Droperidol; Morphine and related; and Tessalon [benzonatate]   Review of Systems Review of Systems  Constitutional: Positive for chills and fever.  HENT: Negative for ear pain and sore throat.   Eyes: Negative for pain and visual disturbance.  Respiratory: Negative for cough and shortness of breath.   Cardiovascular: Negative for chest pain and palpitations.  Gastrointestinal: Negative for abdominal pain, nausea and vomiting.  Genitourinary: Negative for dysuria, frequency, hematuria and urgency.  Musculoskeletal: Negative for arthralgias and back pain.  Skin: Positive for color change and wound.  Neurological: Negative for dizziness, syncope, weakness, light-headedness and headaches.  All other systems reviewed and are negative.    Physical Exam Updated Vital Signs BP 105/72 (BP Location: Left Arm)  Pulse 94   Temp 97.9 F (36.6 C) (Oral)   Resp 16   Ht 5\' 4"  (1.626 m)   Wt 80.7 kg   SpO2 100%   BMI 30.55 kg/m   Physical Exam  Constitutional: She appears well-developed and well-nourished. No distress.  HENT:  Head: Normocephalic and atraumatic.  Right Ear: Tympanic membrane, external ear and ear canal normal.  Left Ear: Tympanic membrane, external ear and ear canal normal.  Mouth/Throat: Uvula is midline, oropharynx is clear and moist and mucous membranes are normal.  Eyes: Conjunctivae are normal. Right eye exhibits no discharge. Left eye exhibits no discharge. No scleral icterus.  Neck: Normal range of motion. Neck supple. No thyromegaly present.  1.5 cm wound noted to the left lateral neck with mild bruising. Patient is slightly tender to palpation over the wound and up the lateral neck. She has full rom of her cervical spine. No area of induration or fluctuance noted. No erythema, warmth, or drainage. No open area.  Cardiovascular: Normal rate, regular rhythm, normal heart sounds and intact distal pulses.   Pulmonary/Chest: Effort normal and breath sounds normal.    Abdominal: Soft. Bowel sounds are normal. She exhibits no distension. There is no tenderness.  Musculoskeletal: Normal range of motion.  Lymphadenopathy:    She has no cervical adenopathy.  Neurological: She is alert.  Skin: Skin is warm and dry. Capillary refill takes less than 2 seconds.  Vitals reviewed.      ED Treatments / Results  Labs (all labs ordered are listed, but only abnormal results are displayed) Labs Reviewed  CBC WITH DIFFERENTIAL/PLATELET - Abnormal; Notable for the following:       Result Value   Neutro Abs 1.6 (*)    All other components within normal limits  BASIC METABOLIC PANEL    EKG  EKG Interpretation None       Radiology Dg Neck Soft Tissue  Result Date: 08/30/2016 CLINICAL DATA:  Left neck pain and drainage after cat bite. EXAM: NECK SOFT TISSUES - 1+ VIEW COMPARISON:  None. FINDINGS: There is no evidence of retropharyngeal soft tissue swelling or epiglottic enlargement. The cervical airway is unremarkable and no radio-opaque foreign body identified. IMPRESSION: No definite soft tissue abnormality seen in the neck. Electronically Signed   By: Lupita Raider, M.D.   On: 08/30/2016 10:35   Ct Soft Tissue Neck W Contrast  Result Date: 08/30/2016 CLINICAL DATA:  Cat bite to left lateral neck 2 days ago. Pain and swelling with reported drainage. Fever. EXAM: CT NECK WITH CONTRAST TECHNIQUE: Multidetector CT imaging of the neck was performed using the standard protocol following the bolus administration of intravenous contrast. CONTRAST:  75mL ISOVUE-300 IOPAMIDOL (ISOVUE-300) INJECTION 61% COMPARISON:  Neck radiograph 08/30/2016 FINDINGS: Pharynx and larynx: The nasopharynx is clear. The oropharynx and hypopharynx are normal. The epiglottis, supraglottic larynx, glottis and subglottic larynx are normal. No retropharyngeal collection. The parapharyngeal spaces are preserved. The visible oral cavity and base of tongue are normal. Salivary glands: The  parotid and submandibular glands are normal. No sialolithiasis or salivary ductal dilatation. Thyroid: Bilateral hypodense thyroid nodules measure less than 1 cm. Lymph nodes: No enlarged or abnormal density cervical lymph nodes. The largest left cervical lymph node is at level IIa, measuring 7 mm. Vascular: The major cervical vessels are normal. Limited intracranial: Normal Visualized orbits: Normal Mastoids and visualized paranasal sinuses: Small left maxillary retention cyst. Otherwise clear. Skeleton: Normal Upper chest: Clear Other: There is no focal skin or subcutaneous  lesion of the left neck to indicate a draining wound. There is a small focus of subcutaneous gas (series 3, image 50) that might be related to a bite. IMPRESSION: 1. No soft tissue abscess or other acute abnormality of the neck. 2. Symmetric bilateral subcentimeter cervical lymph nodes, none of which are of abnormal density or enlarged by CT size criteria. Electronically Signed   By: Deatra Robinson M.D.   On: 08/30/2016 14:34    Procedures Procedures (including critical care time)  Medications Ordered in ED Medications  iopamidol (ISOVUE-300) 61 % injection (not administered)  ibuprofen (ADVIL,MOTRIN) tablet 600 mg (600 mg Oral Given 08/30/16 1250)  amoxicillin-clavulanate (AUGMENTIN) 875-125 MG per tablet 1 tablet (1 tablet Oral Given 08/30/16 1438)  iopamidol (ISOVUE-300) 61 % injection 75 mL (75 mLs Intravenous Contrast Given 08/30/16 1357)     Initial Impression / Assessment and Plan / ED Course  I have reviewed the triage vital signs and the nursing notes.  Pertinent labs & imaging results that were available during my care of the patient were reviewed by me and considered in my medical decision making (see chart for details).  Clinical Course   Patient presents to the ED with cat bite to the left lateral neck. Area with mild bruising and swelling. No erythema, warmth, or purulent drainage noted. No area of fluctuance  or induration concerning for abscess. X-ray without any signs of subcutaneous air. Given patient's significant history with animal bites and cellulitis obtained a CAT scan of her soft neck. Showed no signs of abscess. Of note the radiologist made a comment of a small area of subcutaneous gas likely due to the bite. No crepitus noted. Patient without leukocytosis. She is afebrile, not tachycardic and normotensive. Pain has been controlled in the ED. Area has been demarcated and advised to follow up for wound check in 2-3 days, sooner for worsening systemic symptoms, new lymphangitis, or significant spread of erythema past line of demarcation. Patient is up-to-date on her tetanus. She's also been vaccinated for rabies. She states the cat has documented vaccinations for rabies. She works at a [employer], product/process [employer]. No need for tdap or raies prophylaxis at this time. Patient will be discharged home on Augmentin. I have given the patient strict return precautions given her history of bites. Pt is hemodynamically stable, in NAD, & able to ambulate in the ED. Pain has been managed & has no complaints prior to dc. Pt is comfortable with above plan and is stable for discharge at this time. All questions were answered prior to disposition. Strict return precautions for f/u to the ED were discussed. Patient dicussed with Dr. Jacqulyn Bath who is agreeable to the above plan.      Final Clinical Impressions(s) / ED Diagnoses   Final diagnoses:  Cat bite, initial encounter    New Prescriptions Discharge Medication List as of 08/30/2016  2:51 PM    START taking these medications   Details  amoxicillin-clavulanate (AUGMENTIN) 875-125 MG tablet Take 1 tablet by mouth 2 (two) times daily., Starting Tue 08/30/2016, Print    HYDROcodone-acetaminophen (NORCO/VICODIN) 5-325 MG tablet Take 2 tablets by mouth every 4 (four) hours as needed., Starting Tue 08/30/2016, Print         Rise Mu, PA-C 08/30/16 1541      Maia Plan, MD 08/30/16 1840

## 2016-08-30 NOTE — ED Notes (Signed)
LAB CALLED FOR BLOOD DRAW.

## 2016-09-12 ENCOUNTER — Encounter (HOSPITAL_COMMUNITY): Payer: Self-pay | Admitting: Emergency Medicine

## 2016-09-12 ENCOUNTER — Ambulatory Visit (HOSPITAL_COMMUNITY)
Admission: EM | Admit: 2016-09-12 | Discharge: 2016-09-12 | Disposition: A | Discharge status: Home / Self Care | Payer: Self-pay | Attending: Emergency Medicine | Admitting: Emergency Medicine

## 2016-09-12 DIAGNOSIS — W5501XA Bitten by cat, initial encounter: Secondary | ICD-10-CM

## 2016-09-12 DIAGNOSIS — Z5189 Encounter for other specified aftercare: Secondary | ICD-10-CM

## 2016-09-12 DIAGNOSIS — S1195XA Open bite of unspecified part of neck, initial encounter: Secondary | ICD-10-CM

## 2016-09-12 MED ORDER — TRAMADOL HCL 50 MG PO TABS: 50.0000 mg | ORAL_TABLET | Freq: Four times a day (QID) | ORAL | 0 refills | Status: DC | PRN

## 2016-09-12 NOTE — ED Provider Notes (Signed)
[facility]    CSN: [id number] Arrival date & time: 09/12/16  1735     History   Chief Complaint Chief Complaint  Patient presents with  . Animal Bite    HPI Sabrina [name] is a 51 y.o. female.   HPI She is a 51 year old woman here for evaluation of Bite. She sustained a cat bite to the left neck to half weeks ago. She was seen in the emergency room and placed on Augmentin. She states she developed a blister at the site of the bite which then ruptured. The site has not healed. She has had some pain and discomfort in the area that radiates up to the left ear. No fevers. No purulent drainage.  Past Medical History:  Diagnosis Date  . Arthritis   . Bullous pemphigus   . Cat bite 06/2014   to left elbow  . History of blood transfusion 1988   "when I had my baby"  . 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 Poisoning, snake bite 04/08/2016   "[place]; RUE"  . PONV (postoperative nausea and vomiting)   . Situational anxiety   . Staph infection     Patient Active Problem List   Diagnosis Date Noted  . Right hand pain   . Wrist swelling   . Paresthesia   . Abscess of finger   . Cellulitis of right upper extremity 04/11/2016  . Cellulitis 04/11/2016  . Snake bite   . Medical non-compliance 10/05/2015  . Elbow pain 07/20/2015  . Anxiety state 07/20/2015  . Wound infection 07/04/2015  . Dehydration 07/04/2015  . Tachycardia 07/04/2015  . Diarrhea 07/04/2015  . Wound infection after surgery 07/04/2015  . 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HCC)   . Cat bite of forearm 07/01/2015  . Skin ulcer of upper arm, limited to breakdown of skin (HCC) 07/01/2015  . Cellulitis of upper arm 03/10/2015    Past Surgical History:  Procedure Laterality Date  . APPENDECTOMY  ~ 1987  . APPLICATION OF A-CELL OF EXTREMITY Left 08/05/2015   Procedure: APPLICATION OF A-CELL OF EXTREMITY;  Surgeon: Peggye Form, DO;  Location: [facility];  Service: Plastics;  Laterality: Left;   . BREAST SURGERY Right 1990   "mild duct taken out"  . DEBRIDEMENT AND CLOSURE WOUND Left 07/01/2015   Procedure: LEFT ELBOW EXCISION OF WOUND WITH PRIMARY CLOSURE 2X5 CM ;  Surgeon: Peggye Form, DO;  Location: [facility];  Service: Plastics;  Laterality: Left;  . ELBOW SURGERY  X 23 in [place] <06/2015   from a cat bite; all I&D  . I&D EXTREMITY Left 07/08/2015   Procedure: IRRIGATION AND DEBRIDEMENT EXTREMITY, DRAINAGE OF LEFT ARM WOUND, A-CELL PLACEMENT, WOUND VAC PLACEMENT;  Surgeon: Alena Bills Dillingham, DO;  Location: [facility];  Service: Plastics;  Laterality: Left;  . INCISION AND DRAINAGE OF WOUND Left 08/05/2015   Procedure: IRRIGATION AND DEBRIDEMENT LEFT ELBOW WOUND, PLACEMENT OF ACELL;  Surgeon: Peggye Form, DO;  Location: [facility];  Service: Plastics;  Laterality: Left;  . LAPAROSCOPIC CHOLECYSTECTOMY  1998  . SKIN GRAFT Left 2016   took from anterior thigh; placed at elbow  . TONSILLECTOMY  ~ 2000  . TOTAL ABDOMINAL HYSTERECTOMY  2003    OB History    No data available       Home Medications    Prior to Admission medications   Medication Sig Start Date End Date Taking? Authorizing Provider  acetaminophen (TYLENOL) 500 MG  tablet Take 1,000 mg by mouth every 6 (six) hours as needed for mild pain, moderate pain, fever or headache.    Historical Provider, MD  clonazePAM (KLONOPIN) 0.5 MG tablet Take 0.5 mg by mouth 2 (two) times daily as needed for anxiety.  01/05/16   Historical Provider, MD  gabapentin (NEURONTIN) 300 MG capsule TAKE 2 CAPSULES BY MOUTH 3 TIMES DAILY Patient taking differently: Take 600 mg by mouth 3 (three) times daily.  06/13/16   [name], NP  HYDROcodone-acetaminophen (NORCO/VICODIN) 5-325 MG tablet Take 2 tablets by mouth every 4 (four) hours as needed. 08/30/16   Rise Mu, PA-C  traMADol (ULTRAM) 50 MG tablet Take 1 tablet (50 mg total) by mouth every 6 (six) hours as needed. 09/12/16    Charm Rings, MD    Family History Family History  Problem Relation Age of Onset  . Liver disease Mother   . Dementia Mother   . Cancer Mother   . Cancer Maternal Grandmother     Social History Social History  Substance Use Topics  . Smoking status: Never Smoker  . Smokeless tobacco: Never Used  . Alcohol use No     Comment: 04/11/2016 "I'll have a glass of wine q 2-3 months"     Allergies   Zofran [ondansetron hcl]; Droperidol; Morphine and related; and Tessalon [benzonatate]   Review of Systems Review of Systems As in history of present illness  Physical Exam Triage Vital Signs ED Triage Vitals  Enc Vitals Group     BP 09/12/16 1837 127/82     Pulse Rate 09/12/16 1837 84     Resp 09/12/16 1837 16     Temp 09/12/16 1837 98.4 F (36.9 C)     Temp Source 09/12/16 1837 Oral     SpO2 09/12/16 1837 100 %     Weight 09/12/16 1838 178 lb (80.7 kg)     Height 09/12/16 1838 5\' 4"  (1.626 m)     Head Circumference --      Peak Flow --      Pain Score 09/12/16 1841 6     Pain Loc --      Pain Edu? --      Excl. in GC? --    No data found.   Updated Vital Signs BP 127/82   Pulse 84   Temp 98.4 F (36.9 C) (Oral)   Resp 16   Ht 5\' 4"  (1.626 m)   Wt 178 lb (80.7 kg)   SpO2 100%   BMI 30.55 kg/m   Visual Acuity Right Eye Distance:   Left Eye Distance:   Bilateral Distance:    Right Eye Near:   Left Eye Near:    Bilateral Near:     Physical Exam  Constitutional: She is oriented to person, place, and time. She appears well-developed and well-nourished. No distress.  HENT:  Left TM is normal.  Cardiovascular: Normal rate.   Pulmonary/Chest: Effort normal.  Neurological: She is alert and oriented to person, place, and time.  Skin:  She has a 2 x 2 centimeter shallow ulceration to the left neck. There is minor erythema around the border, but no sign of infection. The base of the ulcer is covered with proteinaceous cereal.     UC Treatments / Results    Labs (all labs ordered are listed, but only abnormal results are displayed) Labs Reviewed - No data to display  EKG  EKG Interpretation None  Radiology No results found.  Procedures Procedures (including critical care time)  Medications Ordered in UC Medications - No data to display   Initial Impression / Assessment and Plan / UC Course  I have reviewed the triage vital signs and the nursing notes.  Pertinent labs & imaging results that were available during my care of the patient were reviewed by me and considered in my medical decision making (see chart for details).  Clinical Course     No sign of infection. Will do wet-to-dry dressings twice a day for the next week to remove proteinaceous deposit. If not improving in 1 week, follow-up with her PCP or here to see about referral to wound care. Reviewed signs of infection. Prescription for tramadol provided to use as needed for pain.  Final Clinical Impressions(s) / UC Diagnoses   Final diagnoses:  Cat bite, initial encounter  Visit for wound check    New Prescriptions Discharge Medication List as of 09/12/2016  7:07 PM    START taking these medications   Details  traMADol (ULTRAM) 50 MG tablet Take 1 tablet (50 mg total) by mouth every 6 (six) hours as needed., Starting Mon 09/12/2016, Normal         Charm RingsErin J Shaneen Reeser, MD 09/12/16 1933

## 2016-09-12 NOTE — Discharge Instructions (Signed)
There is no sign of infection at this time. There is a lot of protein deposited on the wound. This is preventing it from healing. Apply wet-to-dry dressings at least twice a day for the next week. Follow-up here or with your primary care doctor in 1 week for a recheck. If you see signs of infection such as increasing redness, swelling, or foul-smelling drainage, please come back right away.

## 2016-09-12 NOTE — ED Triage Notes (Signed)
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 She was bitten 2.5 weeks ago. PT has had osteomylitis related to cat bite in the past

## 2016-09-13 MED FILL — GABAPENTIN 300 MG CAPSULE: 300 | 15 days supply | Qty: 90 | Fill #3

## 2016-09-26 MED FILL — GABAPENTIN 300 MG CAPSULE: 300 | 30 days supply | Qty: 180 | Fill #0

## 2016-10-10 MED FILL — HYDROCODON-APAP 5-325: 5-325 | 5 days supply | Qty: 20 | Fill #0

## 2016-10-17 MED FILL — traMADol HCL 50 MG TABS: 50 | 10 days supply | Qty: 10 | Fill #0

## 2016-10-24 MED FILL — GABAPENTIN 300 MG CAPSULE: 300 | 30 days supply | Qty: 180 | Fill #1

## 2016-10-25 MED FILL — traMADol HCL 50 MG TABS: 50 | 30 days supply | Qty: 30 | Fill #0

## 2016-11-07 ENCOUNTER — Encounter (HOSPITAL_COMMUNITY): Payer: Self-pay

## 2016-11-07 ENCOUNTER — Emergency Department (HOSPITAL_COMMUNITY)
Admission: EM | Admit: 2016-11-07 | Discharge: 2016-11-07 | Disposition: A | Discharge status: Home / Self Care | Payer: Self-pay | Attending: Emergency Medicine | Admitting: Emergency Medicine

## 2016-11-07 DIAGNOSIS — R112 Nausea with vomiting, unspecified: Secondary | ICD-10-CM | POA: Insufficient documentation

## 2016-11-07 DIAGNOSIS — Z5321 Procedure and treatment not carried out due to patient leaving prior to being seen by health care provider: Secondary | ICD-10-CM | POA: Insufficient documentation

## 2016-11-07 LAB — COMPREHENSIVE METABOLIC PANEL
ALT: 18 U/L (ref 14–54)
ANION GAP: 7 (ref 5–15)
AST: 21 U/L (ref 15–41)
Albumin: 4.6 g/dL (ref 3.5–5.0)
Alkaline Phosphatase: 105 U/L (ref 38–126)
BUN: 20 mg/dL (ref 6–20)
CALCIUM: 9.6 mg/dL (ref 8.9–10.3)
CHLORIDE: 106 mmol/L (ref 101–111)
CO2: 28 mmol/L (ref 22–32)
CREATININE: 0.9 mg/dL (ref 0.44–1.00)
Glucose, Bld: 99 mg/dL (ref 65–99)
Potassium: 3.8 mmol/L (ref 3.5–5.1)
SODIUM: 141 mmol/L (ref 135–145)
Total Bilirubin: 0.5 mg/dL (ref 0.3–1.2)
Total Protein: 7.6 g/dL (ref 6.5–8.1)

## 2016-11-07 LAB — CBC
HCT: 42.1 % (ref 36.0–46.0)
HEMOGLOBIN: 14.5 g/dL (ref 12.0–15.0)
MCH: 31.3 pg (ref 26.0–34.0)
MCHC: 34.4 g/dL (ref 30.0–36.0)
MCV: 90.7 fL (ref 78.0–100.0)
PLATELETS: 274 10*3/uL (ref 150–400)
RBC: 4.64 MIL/uL (ref 3.87–5.11)
RDW: 12.8 % (ref 11.5–15.5)
WBC: 6.3 10*3/uL (ref 4.0–10.5)

## 2016-11-07 LAB — LIPASE, BLOOD: LIPASE: 21 U/L (ref 11–51)

## 2016-11-07 MED FILL — GABAPENTIN 300 MG CAPSULE: 300 | 30 days supply | Qty: 180 | Fill #2

## 2016-11-07 NOTE — ED Notes (Signed)
PT CALLED X3 WITH NO ANSWER. 

## 2016-11-07 NOTE — ED Notes (Signed)
Called name, but no response from waiting room. 

## 2016-11-07 NOTE — ED Notes (Signed)
Called name again, but still no response from waiting room.  

## 2016-11-07 NOTE — ED Triage Notes (Signed)
PT C/O N/V OVER THE WEEKEND W/O ABDOMINAL PAIN OR DIARRHEA. PT STS TODAY SHE JUST HAS NAUSEA THAT IS CONSTANT. DENIES FEVER.

## 2016-11-23 MED FILL — traMADol HCL 50 MG TABS: 50 | 30 days supply | Qty: 30 | Fill #0

## 2016-12-07 MED FILL — GABAPENTIN 300 MG CAPSULE: 300 | 30 days supply | Qty: 180 | Fill #3

## 2016-12-30 ENCOUNTER — Telehealth: Payer: Self-pay

## 2016-12-30 DIAGNOSIS — G609 Hereditary and idiopathic neuropathy, unspecified: Secondary | ICD-10-CM

## 2016-12-30 MED ORDER — GABAPENTIN 300 MG PO CAPS: ORAL_CAPSULE | ORAL | 0 refills | Status: DC

## 2016-12-30 NOTE — Telephone Encounter (Signed)
Rx sent to pharmacy but patient needs a appointment before next fill.

## 2017-01-05 MED FILL — GABAPENTIN 300 MG CAPSULE: 300 | 30 days supply | Qty: 180 | Fill #0

## 2017-01-16 ENCOUNTER — Emergency Department (HOSPITAL_COMMUNITY)
Admission: EM | Admit: 2017-01-16 | Discharge: 2017-01-17 | Disposition: A | Discharge status: Home / Self Care | Payer: Self-pay | Attending: Emergency Medicine | Admitting: Emergency Medicine

## 2017-01-16 ENCOUNTER — Encounter (HOSPITAL_COMMUNITY): Payer: Self-pay | Admitting: Emergency Medicine

## 2017-01-16 DIAGNOSIS — Z5321 Procedure and treatment not carried out due to patient leaving prior to being seen by health care provider: Secondary | ICD-10-CM | POA: Insufficient documentation

## 2017-01-16 DIAGNOSIS — R21 Rash and other nonspecific skin eruption: Secondary | ICD-10-CM | POA: Insufficient documentation

## 2017-01-16 LAB — CBC WITH DIFFERENTIAL/PLATELET
Basophils Absolute: 0 10*3/uL (ref 0.0–0.1)
Basophils Relative: 0 %
Eosinophils Absolute: 0.1 10*3/uL (ref 0.0–0.7)
Eosinophils Relative: 2 %
HCT: 34.3 % — ABNORMAL LOW (ref 36.0–46.0)
Hemoglobin: 11.5 g/dL — ABNORMAL LOW (ref 12.0–15.0)
Lymphocytes Relative: 46 %
Lymphs Abs: 2 10*3/uL (ref 0.7–4.0)
MCH: 31.6 pg (ref 26.0–34.0)
MCHC: 33.5 g/dL (ref 30.0–36.0)
MCV: 94.2 fL (ref 78.0–100.0)
Monocytes Absolute: 0.6 10*3/uL (ref 0.1–1.0)
Monocytes Relative: 13 %
Neutro Abs: 1.7 10*3/uL (ref 1.7–7.7)
Neutrophils Relative %: 39 %
Platelets: 185 10*3/uL (ref 150–400)
RBC: 3.64 MIL/uL — ABNORMAL LOW (ref 3.87–5.11)
RDW: 13 % (ref 11.5–15.5)
WBC: 4.3 10*3/uL (ref 4.0–10.5)

## 2017-01-16 LAB — COMPREHENSIVE METABOLIC PANEL
ALT: 14 U/L (ref 14–54)
AST: 21 U/L (ref 15–41)
Albumin: 4.1 g/dL (ref 3.5–5.0)
Alkaline Phosphatase: 87 U/L (ref 38–126)
Anion gap: 7 (ref 5–15)
BUN: 21 mg/dL — ABNORMAL HIGH (ref 6–20)
CO2: 25 mmol/L (ref 22–32)
Calcium: 8.8 mg/dL — ABNORMAL LOW (ref 8.9–10.3)
Chloride: 106 mmol/L (ref 101–111)
Creatinine, Ser: 0.95 mg/dL (ref 0.44–1.00)
GFR calc Af Amer: >60 mL/min (ref >60)
GFR calc non Af Amer: >60 mL/min (ref >60)
Glucose, Bld: 77 mg/dL (ref 65–99)
Potassium: 3.5 mmol/L (ref 3.5–5.1)
Sodium: 138 mmol/L (ref 135–145)
Total Bilirubin: 0.6 mg/dL (ref 0.3–1.2)
Total Protein: 6.5 g/dL (ref 6.5–8.1)

## 2017-01-16 LAB — I-STAT CG4 LACTIC ACID, ED: Lactic Acid, Venous: 0.42 mmol/L — ABNORMAL LOW (ref 0.5–1.9)

## 2017-01-16 NOTE — ED Triage Notes (Signed)
Pt to ER for evaluation of right forearm red streaking after being injured on Friday with a puncture wound from an animal cage. Pt is a Museum/gallery conservatorvet tech. Pt reports the streaking is new since yesterday. Pt blood pressure noted to be in the 80's systolic, patient very adamant this is her baseline. Pt denies dizziness, lightheadedness, etc. Ambulatory without difficulty. VS otherwise stable.

## 2017-01-16 NOTE — ED Notes (Addendum)
Pt called again with no response. Pt not in waiting room.

## 2017-01-16 NOTE — ED Notes (Signed)
Pt called twice. No response. Will try again.

## 2017-01-17 ENCOUNTER — Emergency Department (HOSPITAL_COMMUNITY)
Admission: EM | Admit: 2017-01-17 | Discharge: 2017-01-17 | Disposition: A | Discharge status: Home / Self Care | Payer: Worker's Compensation | Attending: Emergency Medicine | Admitting: Emergency Medicine

## 2017-01-17 ENCOUNTER — Emergency Department (HOSPITAL_COMMUNITY): Payer: Worker's Compensation

## 2017-01-17 ENCOUNTER — Encounter (HOSPITAL_COMMUNITY): Payer: Self-pay

## 2017-01-17 DIAGNOSIS — Y929 Unspecified place or not applicable: Secondary | ICD-10-CM | POA: Diagnosis not present

## 2017-01-17 DIAGNOSIS — Y999 Unspecified external cause status: Secondary | ICD-10-CM | POA: Insufficient documentation

## 2017-01-17 DIAGNOSIS — Y9389 Activity, other specified: Secondary | ICD-10-CM | POA: Insufficient documentation

## 2017-01-17 DIAGNOSIS — S59911A Unspecified injury of right forearm, initial encounter: Secondary | ICD-10-CM | POA: Diagnosis present

## 2017-01-17 DIAGNOSIS — R58 Hemorrhage, not elsewhere classified: Secondary | ICD-10-CM

## 2017-01-17 DIAGNOSIS — W268XXA Contact with other sharp object(s), not elsewhere classified, initial encounter: Secondary | ICD-10-CM | POA: Insufficient documentation

## 2017-01-17 DIAGNOSIS — S51831A Puncture wound without foreign body of right forearm, initial encounter: Secondary | ICD-10-CM | POA: Insufficient documentation

## 2017-01-17 MED ORDER — HYDROCODONE-ACETAMINOPHEN 5-325 MG PO TABS: 1.0000 | ORAL_TABLET | ORAL | 0 refills | Status: DC | PRN

## 2017-01-17 MED ORDER — DOXYCYCLINE HYCLATE 100 MG PO CAPS: 100.0000 mg | ORAL_CAPSULE | Freq: Two times a day (BID) | ORAL | 0 refills | Status: DC

## 2017-01-17 MED ORDER — IBUPROFEN 800 MG PO TABS: 800.0000 mg | ORAL_TABLET | Freq: Three times a day (TID) | ORAL | 0 refills | Status: DC | PRN

## 2017-01-17 NOTE — Discharge Instructions (Signed)
Read the information below.  Use the prescribed medication as directed.  Please discuss all new medications with your pharmacist.  You may return to the Emergency Department at any time for worsening condition or any new symptoms that concern you.   If you develop uncontrolled pain, weakness or numbness of the extremity, severe discoloration of the skin, or you are unable to move your hand, return to the ER for a recheck.    °

## 2017-01-17 NOTE — ED Triage Notes (Signed)
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 Patient reports that she is having pain and a foul odor from the  Laceration to the right anterior wrist area. Patient was sent here by her employer.

## 2017-01-17 NOTE — ED Notes (Signed)
PT DISCHARGED. INSTRUCTIONS AND PRESCRIPTIONS GIVEN. AAOX4. PT IN NO APPARENT DISTRESS. THE OPPORTUNITY TO ASK QUESTIONS WAS PROVIDED. 

## 2017-01-17 NOTE — ED Provider Notes (Signed)
[facility]-EMERGENCY DEPT Provider Note   CSN: [id number] Arrival date & time: 01/17/17  1503   By signing my name below, I, Soijett Blue, attest that this documentation has been prepared under the direction and in the presence of Trixie DredgeEmily Reis Pienta, PA-C Electronically Signed: Soijett Blue, ED Scribe. 01/17/17. 4:42 PM.  History   Chief Complaint Chief Complaint  Patient presents with  . Laceration    HPI Sabrina [name] is a 51 y.o. female who presents to the Emergency Department complaining of right forearm laceration onset yesterday worsening today. Pt reports associated drainage from laceration site, tingling to right 4th and 5th fingers, and right forearm swelling. Pt has tried ice and elevation with no relief of her symptoms. Has put silvadene cream and bandages on the wounds.  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 She reports that she is right hand dominant. Pt notes that she is rabies vaccinated and her last tetanus vaccination was in 2016. Denies fever, chills, right forearm pain, and any other symptoms.     The history is provided by the patient. No language interpreter was used.    Past Medical History:  Diagnosis Date  . Arthritis   . Bullous pemphigus   . Cat bite 06/2014   to left elbow  . History of blood transfusion 1988   "when I had my baby"  . 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 Poisoning, snake bite 04/08/2016   "[place]; RUE"  . PONV (postoperative nausea and vomiting)   . Situational anxiety   . Staph infection     Patient Active Problem List   Diagnosis Date Noted  . Right hand pain   . Wrist swelling   . Paresthesia   . Abscess of finger   . Cellulitis of right upper extremity 04/11/2016  . Cellulitis 04/11/2016  . Snake bite   . Medical non-compliance 10/05/2015  . Elbow pain 07/20/2015  . Anxiety state 07/20/2015  . Wound  infection 07/04/2015  . Dehydration 07/04/2015  . Tachycardia 07/04/2015  . Diarrhea 07/04/2015  . Wound infection after surgery 07/04/2015  . 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HCC)   . Cat bite of forearm 07/01/2015  . Skin ulcer of upper arm, limited to breakdown of skin (HCC) 07/01/2015  . Cellulitis of upper arm 03/10/2015    Past Surgical History:  Procedure Laterality Date  . APPENDECTOMY  ~ 1987  . APPLICATION OF A-CELL OF EXTREMITY Left 08/05/2015   Procedure: APPLICATION OF A-CELL OF EXTREMITY;  Surgeon: Peggye Formlaire S Dillingham, DO;  Location: [facility];  Service: Plastics;  Laterality: Left;  . BREAST SURGERY Right 1990   "mild duct taken out"  . DEBRIDEMENT AND CLOSURE WOUND Left 07/01/2015   Procedure: LEFT ELBOW EXCISION OF WOUND WITH PRIMARY CLOSURE 2X5 CM ;  Surgeon: Peggye Formlaire S Dillingham, DO;  Location:  SURGERY CENTER;  Service: Plastics;  Laterality: Left;  . ELBOW SURGERY  X 23 in [place] <06/2015   from a cat bite; all I&D  . I&D EXTREMITY Left 07/08/2015   Procedure: IRRIGATION AND DEBRIDEMENT EXTREMITY, DRAINAGE OF LEFT ARM WOUND, A-CELL PLACEMENT, WOUND VAC PLACEMENT;  Surgeon: Alena Billslaire S Dillingham, DO;  Location: [facility];  Service: Plastics;  Laterality: Left;  . INCISION AND DRAINAGE OF WOUND Left 08/05/2015   Procedure: IRRIGATION AND DEBRIDEMENT LEFT ELBOW WOUND, PLACEMENT OF ACELL;  Surgeon: Peggye Form, DO;  Location: [facility];  Service: Plastics;  Laterality: Left;  . LAPAROSCOPIC CHOLECYSTECTOMY  1998  . SKIN GRAFT Left 2016   took from anterior thigh; placed at elbow  . TONSILLECTOMY  ~ 2000  . TOTAL ABDOMINAL HYSTERECTOMY  2003    OB History    No data available       Home Medications    Prior to Admission medications   Medication Sig Start Date End Date Taking? Authorizing Provider  acetaminophen (TYLENOL) 500 MG tablet Take 1,000 mg by mouth every 6 (six) hours as needed for mild pain, moderate pain, fever or  headache.    [provider]  clonazePAM (KLONOPIN) 0.5 MG tablet Take 0.5 mg by mouth 2 (two) times daily as needed for anxiety.  01/05/16   [provider]  doxycycline (VIBRAMYCIN) 100 MG capsule Take 1 capsule (100 mg total) by mouth 2 (two) times daily. One po bid x 7 days 01/17/17   [place], PA-C  gabapentin (NEURONTIN) 300 MG capsule TAKE 2 CAPSULES BY MOUTH 3 TIMES DAILY 12/30/16   Bing Neighbors, FNP  HYDROcodone-acetaminophen (NORCO/VICODIN) 5-325 MG tablet Take 1 tablet by mouth every 4 (four) hours as needed for severe pain. 01/17/17   Trixie Dredge, PA-C  ibuprofen (ADVIL,MOTRIN) 800 MG tablet Take 1 tablet (800 mg total) by mouth every 8 (eight) hours as needed for mild pain or moderate pain. 01/17/17   Trixie Dredge, PA-C  traMADol (ULTRAM) 50 MG tablet Take 1 tablet (50 mg total) by mouth every 6 (six) hours as needed. 09/12/16   Charm Rings, MD    Family History Family History  Problem Relation Age of Onset  . Liver disease Mother   . Dementia Mother   . Cancer Mother   . Cancer Maternal Grandmother     Social History Social History  Substance Use Topics  . Smoking status: Never Smoker  . Smokeless tobacco: Never Used  . Alcohol use No     Comment: 04/11/2016 "I'll have a glass of wine q 2-3 months"     Allergies   Zofran [ondansetron hcl]; Droperidol; Morphine and related; and Tessalon [benzonatate]   Review of Systems Review of Systems  Constitutional: Negative for chills and fever.  Musculoskeletal: Positive for joint swelling (right forearm). Negative for arthralgias.  Skin: Positive for color change and wound (laceration to right forearm).  Neurological: Negative for weakness.       +Tingling to right 4th and 5th fingers  Hematological: Does not bruise/bleed easily.  Psychiatric/Behavioral: Negative for self-injury.     Physical Exam Updated Vital Signs BP 105/62 (BP Location: Right Arm)   Pulse 96   Temp 98.2 F (36.8 C) (Oral)    Resp 18   Ht 5\' 4"  (1.626 m)   Wt 182 lb (82.6 kg)   SpO2 100%   BMI 31.24 kg/m   Physical Exam  Constitutional: She appears well-developed and well-nourished. No distress.  HENT:  Head: Normocephalic and atraumatic.  Neck: Neck supple.  Pulmonary/Chest: Effort normal.  Musculoskeletal:       Right wrist: She exhibits swelling. She exhibits normal range of motion.  Right volar wrist with abraded area. Small puncture wound. Scattered ecchymosis and mild diffuse edema. No fluctuance or induration. Full active ROM of all fingers with decreased sensation in 4th and 5th fingers. Decreased strength throughout secondary to pain  Neurological: She is alert.  Skin: She is not diaphoretic.  Nursing note  and vitals reviewed.      ED Treatments / Results  DIAGNOSTIC STUDIES: Oxygen Saturation is 100% on RA, nl by my interpretation.    COORDINATION OF CARE: 4:39 PM Discussed treatment plan with pt at bedside which includes right wrist xray and pt agreed to plan.   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 Patient reports that she is having pain and a foul odor from the laceration. EXAM: RIGHT WRIST - COMPLETE 3+ VIEW COMPARISON:  None. FINDINGS: There is no evidence of fracture or dislocation. There is no evidence of arthropathy or other focal bone abnormality. Soft tissues are unremarkable. IMPRESSION: No acute osseous injury of the right wrist. Electronically Signed   By: Elige Ko   On: 01/17/2017 17:01    Procedures Procedures (including critical care time)  Medications Ordered in ED Medications - No data to display   Initial Impression / Assessment and Plan / ED Course  I have reviewed the triage vital signs and the nursing notes.  Pertinent labs & imaging results that were available during my care of the  patient were reviewed by me and considered in my medical decision making (see chart for details).  Clinical Course as of Jan 17 1837  Tue Jan 17, 2017  1728 Bedside US does not reveal underlying abscess.    [EW]    Clinical Course User Index [EW] Chad, Taz [place], [place]    Afebrile, nontoxic patient with contusion, abrasion, and puncture wound to right volar forearm.  Doubt deep abscess.  No e/o cellulitis.  Suspect tingling is related to swelling as it developed over time.  Xray negative.  [place] negative for abscess or large fluid collection.  D/C home with doxycycline, pain medication.  2 day recheck with PCP or here. Discussed result, findings, treatment, and follow up  with patient.  Pt given return precautions.  Pt verbalizes understanding and agrees with plan.       Final Clinical Impressions(s) / ED Diagnoses   Final diagnoses:  Ecchymosis of forearm  Puncture wound of right forearm, initial encounter    New Prescriptions Discharge Medication List as of 01/17/2017  5:32 PM    START taking these medications   Details  doxycycline (VIBRAMYCIN) 100 MG capsule Take 1 capsule (100 mg total) by mouth 2 (two) times daily. One po bid x 7 days, Starting Tue 01/17/2017, Print    ibuprofen (ADVIL,MOTRIN) 800 MG tablet Take 1 tablet (800 mg total) by mouth every 8 (eight) hours as needed for mild pain or moderate pain., Starting Tue 01/17/2017, Print       I personally performed the services described in this documentation, which was scribed in my presence. The recorded information has been reviewed and is accurate.     Trixie Dredge, [place] 01/17/17 1840    Nira Conn, MD 01/18/17 0030

## 2017-01-18 ENCOUNTER — Encounter (HOSPITAL_COMMUNITY): Payer: Self-pay | Admitting: Emergency Medicine

## 2017-01-18 ENCOUNTER — Emergency Department (HOSPITAL_COMMUNITY)
Admission: EM | Admit: 2017-01-18 | Discharge: 2017-01-19 | Disposition: A | Discharge status: Home / Self Care | Payer: Self-pay | Attending: Emergency Medicine | Admitting: Emergency Medicine

## 2017-01-18 DIAGNOSIS — Z79899 Other long term (current) drug therapy: Secondary | ICD-10-CM | POA: Insufficient documentation

## 2017-01-18 DIAGNOSIS — L02413 Cutaneous abscess of right upper limb: Secondary | ICD-10-CM | POA: Insufficient documentation

## 2017-01-18 MED ORDER — LIDOCAINE-EPINEPHRINE 2 %-1:200000 IJ SOLN
INTRAMUSCULAR | Status: AC
Start: 2017-01-18 — End: 2017-01-18
  Administered 2017-01-18: 20 mL
  Filled 2017-01-18: qty 20

## 2017-01-18 MED ORDER — SODIUM CHLORIDE 0.9 % IV SOLN
3.0000 g | Freq: Once | INTRAVENOUS | Status: AC
  Administered 2017-01-18: 3 g via INTRAVENOUS
  Filled 2017-01-18: qty 3

## 2017-01-18 MED ORDER — OXYCODONE-ACETAMINOPHEN 5-325 MG PO TABS: 1.0000 | ORAL_TABLET | ORAL | 0 refills | Status: DC | PRN

## 2017-01-18 MED ORDER — LIDOCAINE-EPINEPHRINE (PF) 1 %-1:200000 IJ SOLN
30.0000 mL | Freq: Once | INTRAMUSCULAR | Status: DC
  Filled 2017-01-18: qty 30

## 2017-01-18 MED ORDER — AMOXICILLIN-POT CLAVULANATE 875-125 MG PO TABS: 1.0000 | ORAL_TABLET | Freq: Two times a day (BID) | ORAL | 0 refills | Status: DC

## 2017-01-18 MED ORDER — LIDOCAINE-EPINEPHRINE (PF) 2 %-1:200000 IJ SOLN
10.0000 mL | Freq: Once | INTRAMUSCULAR | Status: AC
  Administered 2017-01-18: 10 mL via INTRADERMAL

## 2017-01-18 MED ORDER — SODIUM CHLORIDE 0.9 % IV SOLN
INTRAVENOUS | Status: DC
  Administered 2017-01-18: 10 mL/h via INTRAVENOUS

## 2017-01-18 NOTE — Discharge Instructions (Signed)
Call the hand surgeons office in the morning to schedule an appointment to be seen. Return here for fever, red streaks going up the arm, or any other problems

## 2017-01-18 NOTE — ED Triage Notes (Signed)
Pt c/o progressive pain and swelling to right anterior wrist at site of work injury that pt was seen and treated for yesterday. 4th and 5th finger tingling. Chills last night. Using warm compresses to help expel serrosanguinous fluid and purulent drainage. Laceration about 1 cm, no drainage currently. Edema to forearm extends to right antecubital. Petechia and red streaks moving from wound to antecubital, pt reports these are new.

## 2017-01-18 NOTE — ED Notes (Signed)
Pt complains of finger numbness and edema. Wound cleaned and covered waiting provider.

## 2017-01-18 NOTE — ED Provider Notes (Addendum)
[facility]-EMERGENCY DEPT Provider Note   CSN: [id number] Arrival date & time: 01/18/17  1735     History   Chief Complaint Chief Complaint  Patient presents with  . Arm Swelling    HPI Sabrina [name] is a 51 y.o. female.  51 year old female presents with increased swelling and purulent drainage from a prior puncture wound which occurred 2 days ago. She denies any fever or chills. Complains of pain and paresthesias to the fourth and fifth digits of the right hand. Denies any redness going up the forearm. Has been able to express purulent material that reaccumulate. Has been compliant with the doxycycline that she was described before.      Past Medical History:  Diagnosis Date  . Arthritis   . Bullous pemphigus   . Cat bite 06/2014   to left elbow  . History of blood transfusion 1988   "when I had my baby"  . 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 Poisoning, snake bite 04/08/2016   "[place]; RUE"  . PONV (postoperative nausea and vomiting)   . Situational anxiety   . Staph infection     Patient Active Problem List   Diagnosis Date Noted  . Right hand pain   . Wrist swelling   . Paresthesia   . Abscess of finger   . Cellulitis of right upper extremity 04/11/2016  . Cellulitis 04/11/2016  . Snake bite   . Medical non-compliance 10/05/2015  . Elbow pain 07/20/2015  . Anxiety state 07/20/2015  . Wound infection 07/04/2015  . Dehydration 07/04/2015  . Tachycardia 07/04/2015  . Diarrhea 07/04/2015  . Wound infection after surgery 07/04/2015  . 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HCC)   . Cat bite of forearm 07/01/2015  . Skin ulcer of upper arm, limited to breakdown of skin (HCC) 07/01/2015  . Cellulitis of upper arm 03/10/2015    Past Surgical History:  Procedure Laterality Date  . APPENDECTOMY  ~ 1987  . APPLICATION OF A-CELL OF EXTREMITY Left 08/05/2015   Procedure: APPLICATION OF A-CELL OF EXTREMITY;  Surgeon: Peggye Formlaire S Dillingham, DO;  Location: [facility];   Service: Plastics;  Laterality: Left;  . BREAST SURGERY Right 1990   "mild duct taken out"  . DEBRIDEMENT AND CLOSURE WOUND Left 07/01/2015   Procedure: LEFT ELBOW EXCISION OF WOUND WITH PRIMARY CLOSURE 2X5 CM ;  Surgeon: Peggye Formlaire S Dillingham, DO;  Location: [facility];  Service: Plastics;  Laterality: Left;  . ELBOW SURGERY  X 23 in [place] <06/2015   from a cat bite; all I&D  . I&D EXTREMITY Left 07/08/2015   Procedure: IRRIGATION AND DEBRIDEMENT EXTREMITY, DRAINAGE OF LEFT ARM WOUND, A-CELL PLACEMENT, WOUND VAC PLACEMENT;  Surgeon: Alena Billslaire S Dillingham, DO;  Location: [facility];  Service: Plastics;  Laterality: Left;  . INCISION AND DRAINAGE OF WOUND Left 08/05/2015   Procedure: IRRIGATION AND DEBRIDEMENT LEFT ELBOW WOUND, PLACEMENT OF ACELL;  Surgeon: Peggye Formlaire S Dillingham, DO;  Location: [facility];  Service: Plastics;  Laterality: Left;  . LAPAROSCOPIC CHOLECYSTECTOMY  1998  . SKIN GRAFT Left 2016   took from anterior thigh; placed at elbow  . TONSILLECTOMY  ~ 2000  . TOTAL ABDOMINAL HYSTERECTOMY  2003    OB History    No data available       Home Medications    Prior to Admission medications   Medication Sig Start Date End Date Taking? Authorizing Provider  clonazePAM (KLONOPIN) 0.5 MG tablet Take 0.5 mg by mouth 2 (  two) times daily as needed for anxiety.  01/05/16  Yes [provider]  doxycycline (VIBRAMYCIN) 100 MG capsule Take 1 capsule (100 mg total) by mouth 2 (two) times daily. One po bid x 7 days 01/17/17  Yes [place], Emily, PA-C  gabapentin (NEURONTIN) 300 MG capsule TAKE 2 CAPSULES BY MOUTH 3 TIMES DAILY Patient taking differently: Take 600 mg by mouth 3 (three) times daily. TAKE 2 CAPSULES BY MOUTH 3 TIMES DAILY 12/30/16  Yes Bing Neighbors, FNP  HYDROcodone-acetaminophen (NORCO/VICODIN) 5-325 MG tablet Take 1 tablet by mouth every 4 (four) hours as needed for severe pain. 01/17/17  Yes West, Emily, PA-C  ibuprofen (ADVIL,MOTRIN) 800 MG  tablet Take 1 tablet (800 mg total) by mouth every 8 (eight) hours as needed for mild pain or moderate pain. 01/17/17  Yes West, Emily, PA-C  traMADol (ULTRAM) 50 MG tablet Take 1 tablet (50 mg total) by mouth every 6 (six) hours as needed. Patient not taking: Reported on 01/18/2017 09/12/16   Charm Rings, MD    Family History Family History  Problem Relation Age of Onset  . Liver disease Mother   . Dementia Mother   . Cancer Mother   . Cancer Maternal Grandmother     Social History Social History  Substance Use Topics  . Smoking status: Never Smoker  . Smokeless tobacco: Never Used  . Alcohol use No     Comment: 04/11/2016 "I'll have a glass of wine q 2-3 months"     Allergies   Zofran [ondansetron hcl]; Flexeril [cyclobenzaprine]; Droperidol; Morphine and related; and Tessalon [benzonatate]   Review of Systems Review of Systems  All other systems reviewed and are negative.    Physical Exam Updated Vital Signs BP (!) 101/45 (BP Location: Left Arm)   Pulse 83   Temp 98.9 F (37.2 C) (Oral)   Resp 18   Ht 5\' 4"  (1.626 m)   Wt 84.4 kg   SpO2 100%   BMI 31.93 kg/m   Physical Exam  Constitutional: She is oriented to person, place, and time. She appears well-developed and well-nourished.  Non-toxic appearance. No distress.  HENT:  Head: Normocephalic and atraumatic.  Eyes: Conjunctivae, EOM and lids are normal. Pupils are equal, round, and reactive to light.  Neck: Normal range of motion. Neck supple. No tracheal deviation present. No thyroid mass present.  Cardiovascular: Normal rate, regular rhythm and normal heart sounds.  Exam reveals no gallop.   No murmur heard. Pulmonary/Chest: Effort normal and breath sounds normal. No stridor. No respiratory distress. She has no decreased breath sounds. She has no wheezes. She has no rhonchi. She has no rales.  Abdominal: Soft. Normal appearance and bowel sounds are normal. She exhibits no distension. There is no tenderness.  There is no rebound and no CVA tenderness.  Musculoskeletal: Normal range of motion. She exhibits no edema or tenderness.       Arms: Neurological: She is alert and oriented to person, place, and time. She has normal strength. No cranial nerve deficit or sensory deficit. GCS eye subscore is 4. GCS verbal subscore is 5. GCS motor subscore is 6.  Skin: Skin is warm and dry. No abrasion and no rash noted.  Psychiatric: She has a normal mood and affect. Her speech is normal and behavior is normal.  Nursing note and vitals reviewed.    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 Patient reports that she is having pain and a foul odor from the laceration. EXAM: RIGHT WRIST - COMPLETE 3+ VIEW COMPARISON:  None. FINDINGS: There is no evidence of fracture or dislocation. There is no evidence of arthropathy or other focal bone abnormality. Soft tissues are unremarkable. IMPRESSION: No acute osseous injury of the right wrist. Electronically Signed   By: Elige Ko   On: 01/17/2017 17:01    Procedures Procedures (including critical care time)  Medications Ordered in ED Medications  0.9 %  sodium chloride infusion (not administered)  Ampicillin-Sulbactam (UNASYN) 3 g in sodium chloride 0.9 % 100 mL IVPB (not administered)     Initial Impression / Assessment and Plan / ED Course  I have reviewed the triage vital signs and the nursing notes.  Pertinent labs & imaging results that were available during my care of the patient were reviewed by me and considered in my medical decision making (see chart for details).      Discussed with Dr. Melvyn Novas who recommends that patient's wound be explored irrigated and packed. We'll see her in the office tomorrow. Agrees with change in her antibiotics to Augmentin. She will get a dose of Unasyn in the department  INCISION AND DRAINAGE Performed by: Toy Baker Consent: Verbal consent obtained. Risks and benefits: risks, benefits and alternatives were discussed Time out performed prior to procedure Type: abscess Body area: forearm Anesthesia: local infiltration Incision was made with a scalpel. Local anesthetic: lidocaine 2% epinephrine Anesthetic total: 6 ml Complexity: complex Blunt dissection to break up loculations Drainage: purulent Drainage amount: none Packing material: iodoform gauze Patient tolerance: Patient tolerated the procedure well with no immediate complications.  No pus appreciated with incision and drainage.  With gauze as above. Patient received her antibiotics. Dr. Melvyn Novas will see tomorrow as above Splint applied by orthopedic tech Final Clinical Impressions(s) / ED Diagnoses   Final diagnoses:  None    New Prescriptions New Prescriptions   No medications on file     Lorre Nick, MD 01/18/17 2336    Lorre Nick, MD 01/18/17 2337

## 2017-01-23 ENCOUNTER — Ambulatory Visit: Payer: Self-pay | Admitting: Family Medicine

## 2017-02-01 ENCOUNTER — Other Ambulatory Visit: Payer: Self-pay | Admitting: Family Medicine

## 2017-02-01 ENCOUNTER — Telehealth: Payer: Self-pay

## 2017-02-01 DIAGNOSIS — G609 Hereditary and idiopathic neuropathy, unspecified: Secondary | ICD-10-CM

## 2017-02-01 MED FILL — GABAPENTIN 300 MG CAPSULE: 300 | 30 days supply | Qty: 180 | Fill #0

## 2017-02-01 NOTE — Telephone Encounter (Signed)
Office visit is required before any medication more medications will be refilled.

## 2017-02-27 ENCOUNTER — Other Ambulatory Visit: Payer: Self-pay | Admitting: Family Medicine

## 2017-02-27 DIAGNOSIS — G609 Hereditary and idiopathic neuropathy, unspecified: Secondary | ICD-10-CM

## 2017-02-28 ENCOUNTER — Ambulatory Visit (INDEPENDENT_AMBULATORY_CARE_PROVIDER_SITE_OTHER): Payer: Self-pay | Admitting: Family Medicine

## 2017-02-28 ENCOUNTER — Encounter: Payer: Self-pay | Admitting: Family Medicine

## 2017-02-28 VITALS — BP 118/70 | HR 87 | Temp 98.3°F | Ht 64.0 in | Wt 176.0 lb

## 2017-02-28 DIAGNOSIS — Z131 Encounter for screening for diabetes mellitus: Secondary | ICD-10-CM

## 2017-02-28 DIAGNOSIS — Z1231 Encounter for screening mammogram for malignant neoplasm of breast: Secondary | ICD-10-CM

## 2017-02-28 DIAGNOSIS — G609 Hereditary and idiopathic neuropathy, unspecified: Secondary | ICD-10-CM

## 2017-02-28 DIAGNOSIS — Z1239 Encounter for other screening for malignant neoplasm of breast: Secondary | ICD-10-CM

## 2017-02-28 LAB — POCT GLYCOSYLATED HEMOGLOBIN (HGB A1C): HEMOGLOBIN A1C: 5.2

## 2017-02-28 MED ORDER — GABAPENTIN 300 MG PO CAPS: ORAL_CAPSULE | ORAL | 3 refills | Status: DC

## 2017-02-28 MED FILL — GABAPENTIN 300 MG CAPSULE: 300 | 30 days supply | Qty: 180 | Fill #0

## 2017-02-28 NOTE — Progress Notes (Signed)
Patient ID: Sabrina [name], female    DOB: 05-21-1966, 51 y.o.   MRN: [id number]  PCP: Bing Neighbors, FNP  Chief Complaint  Patient presents with  . Establish Care  . Medication Refill    gabapentin    Subjective:  HPI  Sabrina [name] is a 51 y.o. female presents to establish care and for medication refill of  gabapentin. 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 Her right arm is currently on brace after a recent puncture wound on a gait while at work. She is scheduled  to have right arm surgery with orthopedic surgery at [facility]opedic in one week. 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 She has been chronically  taking high-dose gabapentin for several years and reports that this is the only medication that alleviates her pain. General health maintenance. Ranyia has a past history of a hysterectomy at age 32 for treatment of endometriosis.  She has had a Pap smear since that procedure which was negative. She is overdue for mammogram.  Social History   Social History  . Marital status: Divorced    Spouse name: N/A  . Number of children: N/A  . Years of education: N/A   Occupational History  . Not on file.   Social History Main Topics  . Smoking status: Never Smoker  . Smokeless tobacco: Never Used  . Alcohol use No     Comment: 04/11/2016 "I'll have a glass of wine q 2-3 months"  . Drug use: No  . Sexual activity: Not Currently   Other Topics Concern  . Not on file   Social History Narrative  . No narrative on file    Family History  Problem Relation Age of Onset  . Liver disease Mother   . Dementia Mother   . Cancer Mother   . Cancer Maternal Grandmother    Review of Systems  see history of present illness  Patient Active Problem List   Diagnosis Date Noted  . Right hand pain   . Wrist swelling   . Paresthesia   . Abscess of finger   .  Cellulitis of right upper extremity 04/11/2016  . Cellulitis 04/11/2016  . Snake bite   . Medical non-compliance 10/05/2015  . Elbow pain 07/20/2015  . Anxiety state 07/20/2015  . Wound infection 07/04/2015  . Dehydration 07/04/2015  . Tachycardia 07/04/2015  . Diarrhea 07/04/2015  . Wound infection after surgery 07/04/2015  . 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HCC)   . Cat bite of forearm 07/01/2015  . Skin ulcer of upper arm, limited to breakdown of skin (HCC) 07/01/2015  . Cellulitis of upper arm 03/10/2015    Allergies  Allergen Reactions  . Zofran [Ondansetron Hcl] Hives and Other (See Comments)    Spots around iv site  . Flexeril [Cyclobenzaprine]   . Droperidol Palpitations  . Morphine And Related Itching  . Tessalon [Benzonatate] Other (See Comments)    Watery eyes    Prior to Admission medications   Medication Sig Start Date End Date Taking? Authorizing Provider  HYDROcodone-acetaminophen (NORCO/VICODIN) 5-325 MG tablet Take 1 tablet by mouth every 4 (four) hours as needed for severe pain. 01/17/17  Yes West, Emily, PA-C  ibuprofen (ADVIL,MOTRIN) 800 MG tablet Take 1 tablet (800 mg total) by mouth every 8 (eight) hours as needed for mild pain or moderate pain. 01/17/17  Yes West, Emily, PA-C  clonazePAM (KLONOPIN) 0.5 MG tablet Take  0.5 mg by mouth 2 (two) times daily as needed for anxiety.  01/05/16   [provider]  gabapentin (NEURONTIN) 300 MG capsule TAKE 2 CAPSULES BY MOUTH 3 TIMES DAILY Patient not taking: Reported on 02/28/2017 02/01/17   Bing NeighborsHarris, Alessia Gonsalez S, FNP  oxyCODONE-acetaminophen (PERCOCET/ROXICET) 5-325 MG tablet Take 1-2 tablets by mouth every 4 (four) hours as needed for severe pain. Patient not taking: Reported on 02/28/2017 01/18/17   Lorre NickAllen, Anthony, MD  traMADol (ULTRAM) 50 MG tablet Take 1 tablet (50 mg total) by mouth every 6 (six) hours as needed. Patient not taking: Reported on 01/18/2017 09/12/16   Charm RingsHonig, Erin J, MD    Past Medical, Surgical Family and  Social History reviewed and updated.    Objective:   Today's Vitals   02/28/17 0917  BP: 118/70  Pulse: 87  Temp: 98.3 F (36.8 C)  TempSrc: Oral  SpO2: 99%  Weight: 176 lb (79.8 kg)  Height: 5\' 4"  (1.626 m)    Wt Readings from Last 3 Encounters:  02/28/17 176 lb (79.8 kg)  01/18/17 186 lb (84.4 kg)  01/17/17 182 lb (82.6 kg)   Physical Exam  Constitutional: She is oriented to person, place, and time. She appears well-developed and well-nourished.  HENT:  Head: Normocephalic and atraumatic.  Eyes: Conjunctivae are normal. Pupils are equal, round, and reactive to light.  Neck: Normal range of motion. Neck supple.  Cardiovascular: Normal rate, normal heart sounds and intact distal pulses.   Pulmonary/Chest: Effort normal and breath sounds normal.  Musculoskeletal: Normal range of motion.  Neurological: She is alert and oriented to person, place, and time.  Skin: Skin is warm and dry.  Psychiatric: She has a normal mood and affect. Her behavior is normal. Judgment and thought content normal.   Assessment & Plan:  1. Hereditary and idiopathic peripheral neuropathy - gabapentin (NEURONTIN) 300 MG capsule; TAKE 2 CAPSULES BY MOUTH 3 TIMES DAILY    2. Screening for breast cancer - MM Digital Screening;  3. Screening for diabetes mellitus - POCT glycosylated hemoglobin (Hb A1C)-5.2  RTC: within 12 months for a complete physical exam   Godfrey PickKimberly S. Tiburcio PeaHarris, MSN, FNP-C [facility]  [address] [place], [place] [address] [phone]

## 2017-02-28 NOTE — Patient Instructions (Addendum)
To schedule your breast exam, please call  Breast Clinic at   Phone: [phone]   I have refilled your Gabapentin.      Exercising to Stay Healthy Exercising regularly is important. It has many health benefits, such as:  Improving your overall fitness, flexibility, and endurance.  Increasing your bone density.  Helping with weight control.  Decreasing your body fat.  Increasing your muscle strength.  Reducing stress and tension.  Improving your overall health.  In order to become healthy and stay healthy, it is recommended that you do moderate-intensity and vigorous-intensity exercise. You can tell that you are exercising at a moderate intensity if you have a higher heart rate and faster breathing, but you are still able to hold a conversation. You can tell that you are exercising at a vigorous intensity if you are breathing much harder and faster and cannot hold a conversation while exercising. How often should I exercise? Choose an activity that you enjoy and set realistic goals. Your health care provider can help you to make an activity plan that works for you. Exercise regularly as directed by your health care provider. This may include:  Doing resistance training twice each week, such as: ? Push-ups. ? Sit-ups. ? Lifting weights. ? Using resistance bands.  Doing a given intensity of exercise for a given amount of time. Choose from these options: ? 150 minutes of moderate-intensity exercise every week. ? 75 minutes of vigorous-intensity exercise every week. ? A mix of moderate-intensity and vigorous-intensity exercise every week.  Children, pregnant women, people who are out of shape, people who are overweight, and older adults may need to consult a health care provider for individual recommendations. If you have any sort of medical condition, be sure to consult your health care provider before starting a new exercise program. What are some exercise  ideas? Some moderate-intensity exercise ideas include:  Walking at a rate of 1 mile in 15 minutes.  Biking.  Hiking.  Golfing.  Dancing.  Some vigorous-intensity exercise ideas include:  Walking at a rate of at least 4.5 miles per hour.  Jogging or running at a rate of 5 miles per hour.  Biking at a rate of at least 10 miles per hour.  Lap swimming.  Roller-skating or in-line skating.  Cross-country skiing.  Vigorous competitive sports, such as football, basketball, and soccer.  Jumping rope.  Aerobic dancing.  What are some everyday activities that can help me to get exercise?  Yard work, such as: ? Pushing a Surveyor, mining. ? Raking and bagging leaves.  Washing and waxing your car.  Pushing a stroller.  Shoveling snow.  Gardening.  Washing windows or floors. How can I be more active in my day-to-day activities?  Use the stairs instead of the elevator.  Take a walk during your lunch break.  If you drive, park your car farther away from work or school.  If you take public transportation, get off one stop early and walk the rest of the way.  Make all of your phone calls while standing up and walking around.  Get up, stretch, and walk around every 30 minutes throughout the day. What guidelines should I follow while exercising?  Do not exercise so much that you hurt yourself, feel dizzy, or get very short of breath.  Consult your health care provider before starting a new exercise program.  Wear comfortable clothes and shoes with good support.  Drink plenty of water while you exercise to prevent dehydration  or heat stroke. Body water is lost during exercise and must be replaced.  Work out until you breathe faster and your heart beats faster. This information is not intended to replace advice given to you by your health care provider. Make sure you discuss any questions you have with your health care provider. Document Released: 10/01/2010 Document  Revised: 02/04/2016 Document Reviewed: 01/30/2014 Elsevier Interactive Patient Education  [employer]2018 Elsevier Inc.

## 2017-03-20 MED FILL — GABAPENTIN 300 MG CAPSULE: 300 | 30 days supply | Qty: 180 | Fill #1

## 2017-04-05 ENCOUNTER — Ambulatory Visit (INDEPENDENT_AMBULATORY_CARE_PROVIDER_SITE_OTHER): Payer: Self-pay | Admitting: Family Medicine

## 2017-04-05 ENCOUNTER — Encounter: Payer: Self-pay | Admitting: Family Medicine

## 2017-04-05 VITALS — BP 104/66 | HR 96 | Temp 98.8°F | Resp 16 | Ht 64.0 in | Wt 184.0 lb

## 2017-04-05 DIAGNOSIS — F419 Anxiety disorder, unspecified: Secondary | ICD-10-CM

## 2017-04-05 MED ORDER — BUSPIRONE HCL 10 MG PO TABS: 10.0000 mg | ORAL_TABLET | Freq: Three times a day (TID) | ORAL | 1 refills | Status: DC

## 2017-04-05 MED ORDER — TRAZODONE HCL 100 MG PO TABS: 100.0000 mg | ORAL_TABLET | Freq: Every day | ORAL | 0 refills | Status: DC

## 2017-04-05 MED FILL — traZODone HCL 100 MG TABS: 100 | 10 days supply | Qty: 10 | Fill #0

## 2017-04-05 MED FILL — busPIRone HCL 10 MG TABS: 10 | 10 days supply | Qty: 30 | Fill #0

## 2017-04-05 NOTE — Patient Instructions (Signed)
Please review [email or web address].

## 2017-04-05 NOTE — Progress Notes (Signed)
Patient ID: Sabrina [name], female    DOB: 03/05/1966, 51 y.o.   MRN: [id number]  PCP: Bing NeighborsHarris, Michaeleen Down S, FNP  Chief Complaint  Patient presents with  . Panic Attack    Subjective:  HPI Sabrina JabsDana L [name] is a 51 y.o. female presents for evaluation of worsening anxiety Medical problems include: Anxiety and multiple injuries to right forearm/hand Sabrina HarmanDana works at [employer] and recently had to go out of work on disability related to a recent re-injury to her right forearm after sustaining nerve damage from picking up a large cat litter box. She reports since this incident occurred and she was forced out of work on due to [employer]'s compensation, her level of anxiety has persistently worsened. Sabrina HarmanDana reports persistent worrying about her overall functional use of her right hand and the possibility of surgery. She reports financial concerns as she has not started receiving worker's compensation payments as of yet. Associated symptoms of panic include sweating, palpations, and insomnia.  Social History   Social History  . Marital status: Divorced    Spouse name: Sabrina [name]  . Number of children: Sabrina [name]  . Years of education: Sabrina [name]   Occupational History  . Not on file.   Social History Main Topics  . Smoking status: Never Smoker  . Smokeless tobacco: Never Used  . Alcohol use No     Comment: 04/11/2016 "I'll have a glass of wine q 2-3 months"  . Drug use: No  . Sexual activity: Not Currently   Other Topics Concern  . Not on file   Social History Narrative  . No narrative on file    Family History  Problem Relation Age of Onset  . Liver disease Mother   . Dementia Mother   . Cancer Mother   . Cancer Maternal Grandmother    Review of Systems  All other systems reviewed and are negative.   See HPI Patient Active Problem List   Diagnosis Date Noted  . Right hand pain   . Wrist swelling   . Paresthesia   . Abscess of finger   . Cellulitis of right upper extremity 04/11/2016   . Cellulitis 04/11/2016  . Snake bite   . Medical non-compliance 10/05/2015  . Elbow pain 07/20/2015  . Anxiety state 07/20/2015  . Wound infection 07/04/2015  . Dehydration 07/04/2015  . Tachycardia 07/04/2015  . Diarrhea 07/04/2015  . Wound infection after surgery 07/04/2015  . 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HCC)   . Cat bite of forearm 07/01/2015  . Skin ulcer of upper arm, limited to breakdown of skin (HCC) 07/01/2015  . Cellulitis of upper arm 03/10/2015    Allergies  Allergen Reactions  . Zofran [Ondansetron Hcl] Hives and Other (See Comments)    Spots around iv site  . Flexeril [Cyclobenzaprine]   . Droperidol Palpitations  . Morphine And Related Itching  . Tessalon [Benzonatate] Other (See Comments)    Watery eyes    Prior to Admission medications   Medication Sig Start Date End Date Taking? Authorizing Provider  gabapentin (NEURONTIN) 300 MG capsule TAKE 2 CAPSULES BY MOUTH 3 TIMES DAILY 02/28/17  Yes Bing NeighborsHarris, Kaine Mcquillen S, FNP  clonazePAM (KLONOPIN) 0.5 MG tablet Take 0.5 mg by mouth 2 (two) times daily as needed for anxiety.  01/05/16   [provider]  HYDROcodone-acetaminophen (NORCO/VICODIN) 5-325 MG tablet Take 1 tablet by mouth every 4 (four) hours as needed for severe pain. Patient not taking: Reported on 04/05/2017 01/17/17   Sabrina DredgeWest, Emily, [place] JerseyPA-C  ibuprofen (ADVIL,MOTRIN) 800 MG tablet Take 1 tablet (800 mg total) by mouth every 8 (eight) hours as needed for mild pain or moderate pain. Patient not taking: Reported on 04/05/2017 01/17/17   Sabrina DredgeWest, Emily, PA-C  oxyCODONE-acetaminophen (PERCOCET/ROXICET) 5-325 MG tablet Take 1-2 tablets by mouth every 4 (four) hours as needed for severe pain. Patient not taking: Reported on 02/28/2017 01/18/17   Lorre NickAllen, Anthony, MD  traMADol (ULTRAM) 50 MG tablet Take 1 tablet (50 mg total) by mouth every 6 (six) hours as needed. Patient not taking: Reported on 01/18/2017 09/12/16   Sabrina RingsHonig, Erin J, MD    Past Medical, Surgical Family and Social  History reviewed and updated.    Objective:   Today's Vitals   04/05/17 1102  BP: 104/66  Pulse: 96  Resp: 16  Temp: 98.8 F (37.1 C)  TempSrc: Oral  SpO2: 100%  Weight: 184 lb (83.5 kg)  Height: 5\' 4"  (1.626 m)    Wt Readings from Last 3 Encounters:  04/05/17 184 lb (83.5 kg)  02/28/17 176 lb (79.8 kg)  01/18/17 186 lb (84.4 kg)   Physical Exam  Constitutional: She is oriented to person, place, and time. She appears well-developed and well-nourished.  HENT:  Head: Normocephalic and atraumatic.  Eyes: Pupils are equal, round, and reactive to light. Conjunctivae and EOM are normal.  Neck: Normal range of motion. Neck supple.  Cardiovascular: Normal rate, regular rhythm, normal heart sounds and intact distal pulses.   Pulmonary/Chest: Effort normal and breath sounds normal.  Musculoskeletal: Normal range of motion.  Lymphadenopathy:    She has no cervical adenopathy.  Neurological: She is alert and oriented to person, place, and time.  Skin: Skin is warm and dry.  Psychiatric: Her speech is normal. Judgment and thought content normal. Her mood appears anxious.    Assessment & Plan:  1. Anxiety, situational related to current health  -Start Buspar 10 mg , 3 times per day as needed.  RTC: 6 weeks for follow-up evaluation of anxiety    Godfrey PickKimberly S. Tiburcio PeaHarris, MSN, FNP-C [facility]  [address] [place], [place] [address] [phone]

## 2017-04-14 ENCOUNTER — Telehealth: Payer: Self-pay

## 2017-04-14 NOTE — Telephone Encounter (Signed)
Left patient a vm that I was return her call

## 2017-04-14 NOTE — Telephone Encounter (Signed)
Patient was given information to [facility] at [facility] to get in today and if she doesn't want to do that she can come in on Monday and she Kim.

## 2017-04-20 MED FILL — busPIRone HCL 10 MG TABS: 10 | 10 days supply | Qty: 30 | Fill #1

## 2017-04-20 MED FILL — GABAPENTIN 300 MG CAPSULE: 300 | 30 days supply | Qty: 180 | Fill #2

## 2017-04-21 ENCOUNTER — Ambulatory Visit (INDEPENDENT_AMBULATORY_CARE_PROVIDER_SITE_OTHER): Payer: Self-pay | Admitting: Family Medicine

## 2017-04-21 ENCOUNTER — Encounter: Payer: Self-pay | Admitting: Family Medicine

## 2017-04-21 VITALS — BP 124/78 | HR 100 | Temp 97.6°F | Resp 16 | Ht 64.0 in | Wt 185.5 lb

## 2017-04-21 DIAGNOSIS — G4701 Insomnia due to medical condition: Secondary | ICD-10-CM

## 2017-04-21 DIAGNOSIS — F419 Anxiety disorder, unspecified: Secondary | ICD-10-CM

## 2017-04-21 MED ORDER — CLONAZEPAM 0.5 MG PO TABS: 0.5000 mg | ORAL_TABLET | Freq: Every day | ORAL | 0 refills | Status: DC

## 2017-04-21 MED ORDER — ESCITALOPRAM OXALATE 10 MG PO TABS: 10.0000 mg | ORAL_TABLET | Freq: Every day | ORAL | 0 refills | Status: DC

## 2017-04-21 NOTE — Patient Instructions (Signed)
Stress and Stress Management Stress is a normal reaction to life events. It is what you feel when life demands more than you are used to or more than you can handle. Some stress can be useful. For example, the stress reaction can help you catch the last bus of the day, study for a test, or meet a deadline at work. But stress that occurs too often or for too long can cause problems. It can affect your emotional health and interfere with relationships and normal daily activities. Too much stress can weaken your immune system and increase your risk for physical illness. If you already have a medical problem, stress can make it worse. What are the causes? All sorts of life events may cause stress. An event that causes stress for one person may not be stressful for another person. Major life events commonly cause stress. These may be positive or negative. Examples include losing your job, moving into a new home, getting married, having a baby, or losing a loved one. Less obvious life events may also cause stress, especially if they occur day after day or in combination. Examples include working long hours, driving in traffic, caring for children, being in debt, or being in a difficult relationship. What are the signs or symptoms? Stress may cause emotional symptoms including, the following:  Anxiety. This is feeling worried, afraid, on edge, overwhelmed, or out of control.  Anger. This is feeling irritated or impatient.  Depression. This is feeling sad, down, helpless, or guilty.  Difficulty focusing, remembering, or making decisions.  Stress may cause physical symptoms, including the following:  Aches and pains. These may affect your head, neck, back, stomach, or other areas of your body.  Tight muscles or clenched jaw.  Low energy or trouble sleeping.  Stress may cause unhealthy behaviors, including the following:  Eating to feel better (overeating) or skipping meals.  Sleeping too little,  too much, or both.  Working too much or putting off tasks (procrastination).  Smoking, drinking alcohol, or using drugs to feel better.  How is this diagnosed? Stress is diagnosed through an assessment by your health care provider. Your health care provider will ask questions about your symptoms and any stressful life events.Your health care provider will also ask about your medical history and may order blood tests or other tests. Certain medical conditions and medicine can cause physical symptoms similar to stress. Mental illness can cause emotional symptoms and unhealthy behaviors similar to stress. Your health care provider may refer you to a mental health professional for further evaluation. How is this treated? Stress management is the recommended treatment for stress.The goals of stress management are reducing stressful life events and coping with stress in healthy ways. Techniques for reducing stressful life events include the following:  Stress identification. Self-monitor for stress and identify what causes stress for you. These skills may help you to avoid some stressful events.  Time management. Set your priorities, keep a calendar of events, and learn to say "no." These tools can help you avoid making too many commitments.  Techniques for coping with stress include the following:  Rethinking the problem. Try to think realistically about stressful events rather than ignoring them or overreacting. Try to find the positives in a stressful situation rather than focusing on the negatives.  Exercise. Physical exercise can release both physical and emotional tension. The key is to find a form of exercise you enjoy and do it regularly.  Relaxation techniques. These relax the body and  mind. Examples include yoga, meditation, tai chi, biofeedback, deep breathing, progressive muscle relaxation, listening to music, being out in nature, journaling, and other hobbies. Again, the key is to find  one or more that you enjoy and can do regularly.  Healthy lifestyle. Eat a balanced diet, get plenty of sleep, and do not smoke. Avoid using alcohol or drugs to relax.  Strong support network. Spend time with family, friends, or other people you enjoy being around.Express your feelings and talk things over with someone you trust.  Counseling or talktherapy with a mental health professional may be helpful if you are having difficulty managing stress on your own. Medicine is typically not recommended for the treatment of stress.Talk to your health care provider if you think you need medicine for symptoms of stress. Follow these instructions at home:  Keep all follow-up visits as directed by your health care provider.  Take all medicines as directed by your health care provider. Contact a health care provider if:  Your symptoms get worse or you start having new symptoms.  You feel overwhelmed by your problems and can no longer manage them on your own. Get help right away if:  You feel like hurting yourself or someone else. This information is not intended to replace advice given to you by your health care provider. Make sure you discuss any questions you have with your health care provider. Document Released: 02/22/2001 Document Revised: 02/04/2016 Document Reviewed: 04/23/2013 Elsevier Interactive Patient Education  2017 [employer]  Living With Anxiety After being diagnosed with an anxiety disorder, you may be relieved to know why you have felt or behaved a certain way. It is natural to also feel overwhelmed about the treatment ahead and what it will mean for your life. With care and support, you can manage this condition and recover from it. How to cope with anxiety Dealing with stress Stress is your body's reaction to life changes and events, both good and bad. Stress can last just a few hours or it can be ongoing. Stress can play a major role in anxiety, so it is important to learn  both how to cope with stress and how to think about it differently. Talk with your health care provider or a counselor to learn more about stress reduction. He or she may suggest some stress reduction techniques, such as:  Music therapy. This can include creating or listening to music that you enjoy and that inspires you.  Mindfulness-based meditation. This involves being aware of your normal breaths, rather than trying to control your breathing. It can be done while sitting or walking.  Centering prayer. This is a kind of meditation that involves focusing on a word, phrase, or sacred image that is meaningful to you and that brings you peace.  Deep breathing. To do this, expand your stomach and inhale slowly through your nose. Hold your breath for 3-5 seconds. Then exhale slowly, allowing your stomach muscles to relax.  Self-talk. This is a skill where you identify thought patterns that lead to anxiety reactions and correct those thoughts.  Muscle relaxation. This involves tensing muscles then relaxing them.  Choose a stress reduction technique that fits your lifestyle and personality. Stress reduction techniques take time and practice. Set aside 5-15 minutes a day to do them. Therapists can offer training in these techniques. The training may be covered by some insurance plans. Other things you can do to manage stress include:  Keeping a stress diary. This can help you learn what   triggers your stress and ways to control your response.  Thinking about how you respond to certain situations. You may not be able to control everything, but you can control your reaction.  Making time for activities that help you relax, and not feeling guilty about spending your time in this way.  Therapy combined with coping and stress-reduction skills provides the best chance for successful treatment. Medicines Medicines can help ease symptoms. Medicines for anxiety include:  Anti-anxiety  drugs.  Antidepressants.  Beta-blockers.  Medicines may be used as the main treatment for anxiety disorder, along with therapy, or if other treatments are not working. Medicines should be prescribed by a health care provider. Relationships Relationships can play a big part in helping you recover. Try to spend more time connecting with trusted friends and family members. Consider going to couples counseling, taking family education classes, or going to family therapy. Therapy can help you and others better understand the condition. How to recognize changes in your condition Everyone has a different response to treatment for anxiety. Recovery from anxiety happens when symptoms decrease and stop interfering with your daily activities at home or work. This may mean that you will start to:  Have better concentration and focus.  Sleep better.  Be less irritable.  Have more energy.  Have improved memory.  It is important to recognize when your condition is getting worse. Contact your health care provider if your symptoms interfere with home or work and you do not feel like your condition is improving. Where to find help and support: You can get help and support from these sources:  Self-help groups.  Online and community organizations.  A trusted spiritual leader.  Couples counseling.  Family education classes.  Family therapy.  Follow these instructions at home:  Eat a healthy diet that includes plenty of vegetables, fruits, whole grains, low-fat dairy products, and lean protein. Do not eat a lot of foods that are high in solid fats, added sugars, or salt.  Exercise. Most adults should do the following: ? Exercise for at least 150 minutes each week. The exercise should increase your heart rate and make you sweat (moderate-intensity exercise). ? Strengthening exercises at least twice a week.  Cut down on caffeine, tobacco, alcohol, and other potentially harmful  substances.  Get the right amount and quality of sleep. Most adults need 7-9 hours of sleep each night.  Make choices that simplify your life.  Take over-the-counter and prescription medicines only as told by your health care provider.  Avoid caffeine, alcohol, and certain over-the-counter cold medicines. These may make you feel worse. Ask your pharmacist which medicines to avoid.  Keep all follow-up visits as told by your health care provider. This is important. Questions to ask your health care provider  Would I benefit from therapy?  How often should I follow up with a health care provider?  How long do I need to take medicine?  Are there any long-term side effects of my medicine?  Are there any alternatives to taking medicine? Contact a health care provider if:  You have a hard time staying focused or finishing daily tasks.  You spend many hours a day feeling worried about everyday life.  You become exhausted by worry.  You start to have headaches, feel tense, or have nausea.  You urinate more than normal.  You have diarrhea. Get help right away if:  You have a racing heart and shortness of breath.  You have thoughts of hurting yourself or   others. If you ever feel like you may hurt yourself or others, or have thoughts about taking your own life, get help right away. You can go to your nearest emergency department or call:  Your local emergency services (911 in the [place]  A suicide crisis helpline, such as the National Suicide Prevention Lifeline at 1-800-273-8255. This is open 24-hours a day.  Summary  Taking steps to deal with stress can help calm you.  Medicines cannot cure anxiety disorders, but they can help ease symptoms.  Family, friends, and partners can play a big part in helping you recover from an anxiety disorder. This information is not intended to replace advice given to you by your health care provider. Make sure you discuss any questions you  have with your health care provider. Document Released: 08/23/2016 Document Revised: 08/23/2016 Document Reviewed: 08/23/2016 Elsevier Interactive Patient Education  2018 Elsevier Inc.  

## 2017-04-21 NOTE — Progress Notes (Signed)
Patient ID: Sabrina [name], female    DOB: 01/05/1966, 51 y.o.   MRN: [id number]  PCP: Bing Neighbors, FNP  Chief Complaint  Patient presents with  . Follow-up    ANXIETY    Subjective:  HPI Sabrina [name] is a 51 y.o. female presents for follow-up of anxiety symptoms. Sabrina [name] was seen in office for this same problem 04/05/2017 and 04/21/2017. Sabrina [name] reports worsening anxiety related to a persistent chronic hand injury which has now resulted in the lost of her job at a [employer]. She reports since losing her job she's had massive financial and mental health stressors. During her visit on 04/21/2017 she was prescribed trazodone to help with sleep and mood. She reports trazodone did not help her sleep however it just put her in more of a catatonic state without inducing drowsiness. Sabrina [name] under mental health services in the past [facility] and reports that she has had Xanax prescribed previously which worked to decrease her anxiety. She has been tried on BuSpar and reports that has not helped at all with her level at the anxiety. She has attempted to schedule an appointment with [facility] and [facility] and was told that her first available appointment is early September. She reports that she is unable to think clearly because her anxiety level is so high. She denies any thoughts of suicide or harm to others. Social History   Social History  . Marital status: Divorced    Spouse name: N/A  . Number of children: N/A  . Years of education: N/A   Occupational History  . Not on file.   Social History Main Topics  . Smoking status: Never Smoker  . Smokeless tobacco: Never Used  . Alcohol use No     Comment: 04/11/2016 "I'll have a glass of wine q 2-3 months"  . Drug use: No  . Sexual activity: Not Currently   Other Topics Concern  . Not on file   Social History Narrative  . No narrative on file    Family History  Problem Relation Age of Onset  . Liver  disease Mother   . Dementia Mother   . Cancer Mother   . Cancer Maternal Grandmother    Review of Systems See history of present illness Patient Active Problem List   Diagnosis Date Noted  . Right hand pain   . Wrist swelling   . Paresthesia   . Abscess of finger   . Cellulitis of right upper extremity 04/11/2016  . Cellulitis 04/11/2016  . Snake bite   . Medical non-compliance 10/05/2015  . Elbow pain 07/20/2015  . Anxiety state 07/20/2015  . Wound infection 07/04/2015  . Dehydration 07/04/2015  . Tachycardia 07/04/2015  . Diarrhea 07/04/2015  . Wound infection after surgery 07/04/2015  . 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HCC)   . Cat bite of forearm 07/01/2015  . Skin ulcer of upper arm, limited to breakdown of skin (HCC) 07/01/2015  . Cellulitis of upper arm 03/10/2015    Allergies  Allergen Reactions  . Zofran [Ondansetron Hcl] Hives and Other (See Comments)    Spots around iv site  . Flexeril [Cyclobenzaprine]   . Droperidol Palpitations  . Morphine And Related Itching  . Tessalon [Benzonatate] Other (See Comments)    Watery eyes    Prior to Admission medications   Medication Sig Start Date End Date Taking? Authorizing Provider  busPIRone (BUSPAR) 10 MG tablet Take 1 tablet (10 mg total) by mouth 3 (three)  times daily. 04/05/17  Yes Bing NeighborsHarris, Guillaume Weninger S, FNP  gabapentin (NEURONTIN) 300 MG capsule TAKE 2 CAPSULES BY MOUTH 3 TIMES DAILY 02/28/17  Yes Bing NeighborsHarris, Alysah Carton S, FNP  HYDROcodone-acetaminophen (NORCO/VICODIN) 5-325 MG tablet Take 1 tablet by mouth every 4 (four) hours as needed for severe pain. 01/17/17  Yes West, Emily, PA-C  clonazePAM (KLONOPIN) 0.5 MG tablet Take 0.5 mg by mouth 2 (two) times daily as needed for anxiety.  01/05/16   [provider]  ibuprofen (ADVIL,MOTRIN) 800 MG tablet Take 1 tablet (800 mg total) by mouth every 8 (eight) hours as needed for mild pain or moderate pain. Patient not taking: Reported on 04/05/2017 01/17/17   Trixie DredgeWest, Emily, PA-C   oxyCODONE-acetaminophen (PERCOCET/ROXICET) 5-325 MG tablet Take 1-2 tablets by mouth every 4 (four) hours as needed for severe pain. Patient not taking: Reported on 02/28/2017 01/18/17   Lorre NickAllen, Anthony, MD  traMADol (ULTRAM) 50 MG tablet Take 1 tablet (50 mg total) by mouth every 6 (six) hours as needed. Patient not taking: Reported on 01/18/2017 09/12/16   Charm RingsHonig, Erin J, MD  traZODone (DESYREL) 100 MG tablet Take 1 tablet (100 mg total) by mouth at bedtime. Patient not taking: Reported on 04/21/2017 04/05/17   Bing NeighborsHarris, Mairi Stagliano S, FNP    Past Medical, Surgical Family and Social History reviewed and updated.    Objective:   Today's Vitals   04/21/17 1029  BP: 124/78  Pulse: 100  Resp: 16  Temp: 97.6 F (36.4 C)  TempSrc: Oral  SpO2: 98%  Weight: 185 lb 8 oz (84.1 kg)  Height: 5\' 4"  (1.626 m)    Wt Readings from Last 3 Encounters:  04/21/17 185 lb 8 oz (84.1 kg)  04/05/17 184 lb (83.5 kg)  02/28/17 176 lb (79.8 kg)    Physical Exam  Constitutional: She is oriented to person, place, and time.  Cardiovascular: Regular rhythm and normal heart sounds.  Tachycardia present.   Pulmonary/Chest: Effort normal and breath sounds normal.  Neurological: She is alert and oriented to person, place, and time.  Skin: Skin is warm and dry.  Psychiatric: Her behavior is normal. Judgment and thought content normal. Her mood appears anxious.  Tearful during portions of visit today.   Assessment & Plan:  1. 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   A total of 20 minutes spent, greater than 50 % of this time was spent reviewing prior medical history, reviewing medications and indications of treatment, prior labs and diagnostic tests, discussing current  plan of treatment, health promotion, and goals of treatment.   Godfrey PickKimberly S. Tiburcio PeaHarris, MSN, FNP-C [facility]  [address] [place], [place] [id number] [phone]

## 2017-04-22 ENCOUNTER — Encounter (HOSPITAL_COMMUNITY): Payer: Self-pay | Admitting: *Deleted

## 2017-04-22 ENCOUNTER — Emergency Department (HOSPITAL_COMMUNITY)
Admission: EM | Admit: 2017-04-22 | Discharge: 2017-04-22 | Disposition: A | Discharge status: Home / Self Care | Payer: Self-pay | Attending: Emergency Medicine | Admitting: Emergency Medicine

## 2017-04-22 DIAGNOSIS — Z79899 Other long term (current) drug therapy: Secondary | ICD-10-CM | POA: Insufficient documentation

## 2017-04-22 DIAGNOSIS — F411 Generalized anxiety disorder: Secondary | ICD-10-CM | POA: Insufficient documentation

## 2017-04-22 MED ORDER — LORAZEPAM 1 MG PO TABS: 1.0000 mg | ORAL_TABLET | Freq: Three times a day (TID) | ORAL | 0 refills | Status: DC | PRN

## 2017-04-22 NOTE — Discharge Instructions (Signed)
Return here as needed. Follow up with your PCP.  °

## 2017-04-22 NOTE — ED Triage Notes (Signed)
Per pt report: Pt was injured on May 4th at work and has been out of work since then. Since then she has many stressors both in her rehabilitation and financial stressors. Pt has been having multiple anxiety attacks.  Pt put on buspar and trazadone but pt isn't able to tolerate trazadone.  Pt didn't feel like the buspar is working and then her PCP gave her a prescription for clonipin and it helped the pt. However when pt subsequently took the clonipin, pt felt the medicine doesn't help anymore. Pt coming in to see if there are medications or other resources that can help pt feel better. Pt denies SI/HI/AVH.

## 2017-04-29 NOTE — ED Provider Notes (Signed)
[facility]-EMERGENCY DEPT Provider Note   CSN: [id number] Arrival date & time: 04/22/17  6553     History   Chief Complaint Chief Complaint  Patient presents with  . Anxiety    HPI Sabrina [name] is a 51 y.o. female.  HPI Patient presents to the emergency department with anxiety.  The patient, states she has had a lot of anxiety and depression since she was injured on the job back in May.  The patient states that she feels very overwhelmed with losing her job and she is facing potential financial issues with her housing and the patient states that she still in therapy for her injuries.  Patient states that she has been taking BuSpar that was prescribed by her primary doctor.  The patient states that nothing seems make the condition better or worse.  She states that these part does not seem to be helping her.  Patient denies headache, blurred vision, back pain, neck pain, fever, dysuria, incontinence, nausea, vomiting, diarrhea, or syncope Past Medical History:  Diagnosis Date  . Arthritis   . Bullous pemphigus   . Cat bite 06/2014   to left elbow  . History of blood transfusion 1988   "when I had my baby"  . 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 Poisoning, snake bite 04/08/2016   "[place]; RUE"  . PONV (postoperative nausea and vomiting)   . Situational anxiety   . Staph infection     Patient Active Problem List   Diagnosis Date Noted  . Right hand pain   . Wrist swelling   . Paresthesia   . Abscess of finger   . Cellulitis of right upper extremity 04/11/2016  . Cellulitis 04/11/2016  . Snake bite   . Medical non-compliance 10/05/2015  . Elbow pain 07/20/2015  . Anxiety state 07/20/2015  . Wound infection 07/04/2015  . Dehydration 07/04/2015  . Tachycardia 07/04/2015  . Diarrhea 07/04/2015  . Wound infection after surgery 07/04/2015  . 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HCC)   . Cat bite of forearm 07/01/2015  . Skin ulcer of upper arm, limited to breakdown of skin (HCC) 07/01/2015    . Cellulitis of upper arm 03/10/2015    Past Surgical History:  Procedure Laterality Date  . APPENDECTOMY  ~ 1987  . APPLICATION OF A-CELL OF EXTREMITY Left 08/05/2015   Procedure: APPLICATION OF A-CELL OF EXTREMITY;  Surgeon: Peggye Form, DO;  Location: [facility];  Service: Plastics;  Laterality: Left;  . BREAST SURGERY Right 1990   "mild duct taken out"  . DEBRIDEMENT AND CLOSURE WOUND Left 07/01/2015   Procedure: LEFT ELBOW EXCISION OF WOUND WITH PRIMARY CLOSURE 2X5 CM ;  Surgeon: Peggye Form, DO;  Location: [facility];  Service: Plastics;  Laterality: Left;  . ELBOW SURGERY  X 23 in [place] <06/2015   from a cat bite; all I&D  . I&D EXTREMITY Left 07/08/2015   Procedure: IRRIGATION AND DEBRIDEMENT EXTREMITY, DRAINAGE OF LEFT ARM WOUND, A-CELL PLACEMENT, WOUND VAC PLACEMENT;  Surgeon: Alena Bills Dillingham, DO;  Location: [facility];  Service: Plastics;  Laterality: Left;  . INCISION AND DRAINAGE OF WOUND Left 08/05/2015   Procedure: IRRIGATION AND DEBRIDEMENT LEFT ELBOW WOUND, PLACEMENT OF ACELL;  Surgeon: Peggye Form, DO;  Location: [facility];  Service: Plastics;  Laterality: Left;  . LAPAROSCOPIC CHOLECYSTECTOMY  1998  . SKIN GRAFT Left 2016   took from anterior thigh; placed at elbow  . TONSILLECTOMY  ~ 2000  .  TOTAL ABDOMINAL HYSTERECTOMY  2003    OB History    No data available       Home Medications    Prior to Admission medications   Medication Sig Start Date End Date Taking? Authorizing Provider  busPIRone (BUSPAR) 10 MG tablet Take 1 tablet (10 mg total) by mouth 3 (three) times daily. 04/05/17   Bing Neighbors, FNP  clonazePAM (KLONOPIN) 0.5 MG tablet Take 1 tablet (0.5 mg total) by mouth at bedtime. 04/21/17   Bing Neighbors, FNP  escitalopram (LEXAPRO) 10 MG tablet Take 1 tablet (10 mg total) by mouth at bedtime. 04/21/17   Bing Neighbors, FNP  gabapentin (NEURONTIN) 300 MG capsule TAKE 2  CAPSULES BY MOUTH 3 TIMES DAILY 02/28/17   Bing Neighbors, FNP  HYDROcodone-acetaminophen (NORCO/VICODIN) 5-325 MG tablet Take 1 tablet by mouth every 4 (four) hours as needed for severe pain. 01/17/17   Trixie Dredge, PA-C  ibuprofen (ADVIL,MOTRIN) 800 MG tablet Take 1 tablet (800 mg total) by mouth every 8 (eight) hours as needed for mild pain or moderate pain. Patient not taking: Reported on 04/05/2017 01/17/17   Trixie Dredge, PA-C  LORazepam (ATIVAN) 1 MG tablet Take 1 tablet (1 mg total) by mouth 3 (three) times daily as needed for anxiety. 04/22/17   Jennier Schissler, Cristal [place], PA-C  oxyCODONE-acetaminophen (PERCOCET/ROXICET) 5-325 MG tablet Take 1-2 tablets by mouth every 4 (four) hours as needed for severe pain. Patient not taking: Reported on 02/28/2017 01/18/17   Lorre Nick, MD  traMADol (ULTRAM) 50 MG tablet Take 1 tablet (50 mg total) by mouth every 6 (six) hours as needed. Patient not taking: Reported on 01/18/2017 09/12/16   Charm Rings, MD    Family History Family History  Problem Relation Age of Onset  . Liver disease Mother   . Dementia Mother   . Cancer Mother   . Cancer Maternal Grandmother     Social History Social History  Substance Use Topics  . Smoking status: Never Smoker  . Smokeless tobacco: Never Used  . Alcohol use No     Comment: 04/11/2016 "I'll have a glass of wine q 2-3 months"     Allergies   Zofran [ondansetron hcl]; Flexeril [cyclobenzaprine]; Droperidol; Morphine and related; and Tessalon [benzonatate]   Review of Systems Review of Systems All other systems negative except as documented in the HPI. All pertinent positives and negatives as reviewed in the HPI.  Physical Exam Updated Vital Signs BP 115/82 (BP Location: Right Arm)   Pulse (!) 105   Temp 98.8 F (37.1 C) (Oral)   Resp 16   SpO2 100%   Physical Exam  Constitutional: She is oriented to person, place, and time. She appears well-developed and well-nourished. No distress.  HENT:  Head:  Normocephalic and atraumatic.  Eyes: Pupils are equal, round, and reactive to light.  Pulmonary/Chest: Effort normal.  Neurological: She is alert and oriented to person, place, and time.  Skin: Skin is warm and dry.  Psychiatric: Her mood appears anxious. She exhibits a depressed mood. She expresses no homicidal and no suicidal ideation. She expresses no suicidal plans and no homicidal plans.  Nursing note and vitals reviewed.    ED Treatments / Results  Labs (all labs ordered are listed, but only abnormal results are displayed) Labs Reviewed - No data to display  EKG  EKG Interpretation None       Radiology No results found.  Procedures Procedures (including critical care time)  Medications Ordered in  ED Medications - No data to display   Initial Impression / Assessment and Plan / ED Course  I have reviewed the triage vital signs and the nursing notes.  Pertinent labs & imaging results that were available during my care of the patient were reviewed by me and considered in my medical decision making (see chart for details).     The patient resources for follow-up.  I also advised her that she will need to closely monitor herself and she does become suicidal from need to return here for recheck.  Patient agrees the plan and all questions were answered.  The patient was here for resources mainly for help with her depression and anxiety  Final Clinical Impressions(s) / ED Diagnoses   Final diagnoses:  Anxiety state    New Prescriptions Discharge Medication List as of 04/22/2017 11:01 AM    START taking these medications   Details  LORazepam (ATIVAN) 1 MG tablet Take 1 tablet (1 mg total) by mouth 3 (three) times daily as needed for anxiety., Starting Sat 04/22/2017, Print         Marena Witts, [place], PA-C 04/29/17 1427    Samuel Jester, DO 04/30/17 1300

## 2017-05-01 MED FILL — busPIRone HCL 10 MG TABS: 10 | 10 days supply | Qty: 30 | Fill #2 | Status: TO

## 2017-05-13 ENCOUNTER — Emergency Department (HOSPITAL_COMMUNITY): Payer: Worker's Compensation

## 2017-05-13 ENCOUNTER — Emergency Department (HOSPITAL_COMMUNITY)
Admission: EM | Admit: 2017-05-13 | Discharge: 2017-05-14 | Disposition: A | Discharge status: Home / Self Care | Payer: Worker's Compensation | Attending: Emergency Medicine | Admitting: Emergency Medicine

## 2017-05-13 ENCOUNTER — Encounter (HOSPITAL_COMMUNITY): Payer: Self-pay

## 2017-05-13 DIAGNOSIS — M79641 Pain in right hand: Secondary | ICD-10-CM | POA: Diagnosis present

## 2017-05-13 MED ORDER — MELOXICAM 7.5 MG PO TABS: 7.5000 mg | ORAL_TABLET | Freq: Every day | ORAL | 0 refills | Status: DC

## 2017-05-13 NOTE — ED Triage Notes (Signed)
Pt reports wrist surgery in May. She has been doing therapy since. She states that 3 days ago she started experiencing increased swelling and pain. She called Dr Orlan Leavensrtman, who started her on a Medrol dose pack. However since this morning she has been unable to open her R hand. She called the Orthopedic office and they advised that she come here to be evaluated. A&Ox4. Ambulatory.

## 2017-05-13 NOTE — ED Provider Notes (Signed)
[facility]Y DEPT Provider Note   CSN: [id number] Arrival date & time: 05/13/17  1707     History   Chief Complaint Chief Complaint  Patient presents with  . Hand Pain    R  . Post-op Problem    HPI Sabrina [name] is a 51 y.o. female who reports she had wrist surgery on her right hand in may. She reports that she is currently unable to straighten the fingers on her right hand without severe pain. She reports that she took a Norco this morning and that did not help her pain. She reports that her pain has been associated with swelling and has been worse over the past 2-3 days. She had called hand surgery and they started her on a Medrol Dosepak yesterday.  Ports that she is actively receiving therapy for her hand.  She reports that she is able to straighten her fingers however it feels like it is ripping and tearing on her anterior forearm just proximal to her palm.  She reports that her fingers feel like they are asleep which is normal for her.  She denies any new trauma, no fevers or chills, occasional nausea but only when her pain levels go up.   HPI  Past Medical History:  Diagnosis Date  . Arthritis   . Bullous pemphigus   . Cat bite 06/2014   to left elbow  . History of blood transfusion 1988   "when I had my baby"  . 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 Poisoning, snake bite 04/08/2016   "[place]; RUE"  . PONV (postoperative nausea and vomiting)   . Situational anxiety   . Staph infection     Patient Active Problem List   Diagnosis Date Noted  . Right hand pain   . Wrist swelling   . Paresthesia   . Abscess of finger   . Cellulitis of right upper extremity 04/11/2016  . Cellulitis 04/11/2016  . Snake bite   . Medical non-compliance 10/05/2015  . Elbow pain 07/20/2015  . Anxiety state 07/20/2015  . Wound infection 07/04/2015  . Dehydration 07/04/2015  . Tachycardia 07/04/2015  . Diarrhea 07/04/2015  . Wound infection after surgery 07/04/2015  . Osteomyelitis  of arm (HCC)   . Cat bite of forearm 07/01/2015  . Skin ulcer of upper arm, limited to breakdown of skin (HCC) 07/01/2015  . Cellulitis of upper arm 03/10/2015    Past Surgical History:  Procedure Laterality Date  . APPENDECTOMY  ~ 1987  . APPLICATION OF A-CELL OF EXTREMITY Left 08/05/2015   Procedure: APPLICATION OF A-CELL OF EXTREMITY;  Surgeon: Peggye Form, DO;  Location: [facility];  Service: Plastics;  Laterality: Left;  . BREAST SURGERY Right 1990   "mild duct taken out"  . DEBRIDEMENT AND CLOSURE WOUND Left 07/01/2015   Procedure: LEFT ELBOW EXCISION OF WOUND WITH PRIMARY CLOSURE 2X5 CM ;  Surgeon: Peggye Form, DO;  Location:  SURGERY CENTER;  Service: Plastics;  Laterality: Left;  . ELBOW SURGERY  X 23 in [place] <06/2015   from a cat bite; all I&D  . I&D EXTREMITY Left 07/08/2015   Procedure: IRRIGATION AND DEBRIDEMENT EXTREMITY, DRAINAGE OF LEFT ARM WOUND, A-CELL PLACEMENT, WOUND VAC PLACEMENT;  Surgeon: Alena Bills Dillingham, DO;  Location: [facility];  Service: Plastics;  Laterality: Left;  . INCISION AND DRAINAGE OF WOUND Left 08/05/2015   Procedure: IRRIGATION AND DEBRIDEMENT LEFT ELBOW WOUND, PLACEMENT OF ACELL;  Surgeon: Alena Bills Dillingham, DO;  Location: [facility];  Service: Plastics;  Laterality: Left;  . LAPAROSCOPIC CHOLECYSTECTOMY  1998  . SKIN GRAFT Left 2016   took from anterior thigh; placed at elbow  . TONSILLECTOMY  ~ 2000  . TOTAL ABDOMINAL HYSTERECTOMY  2003    OB History    No data available       Home Medications    Prior to Admission medications   Medication Sig Start Date End Date Taking? Authorizing Provider  ALPRAZolam Prudy Feeler) 1 MG tablet Take 1 mg by mouth daily as needed for anxiety.   Yes [provider]  gabapentin (NEURONTIN) 300 MG capsule TAKE 2 CAPSULES BY MOUTH 3 TIMES DAILY 02/28/17  Yes Bing Neighbors, FNP  HYDROcodone-acetaminophen (NORCO) 10-325 MG tablet Take 1  tablet by mouth 3 (three) times daily as needed for severe pain.   Yes [provider]  methylPREDNISolone (MEDROL DOSEPAK) 4 MG TBPK tablet Take 4 mg by mouth as directed. Taper pack: 5,4,3,2,1   Yes [provider]  busPIRone (BUSPAR) 10 MG tablet Take 1 tablet (10 mg total) by mouth 3 (three) times daily. Patient not taking: Reported on 05/13/2017 04/05/17   Bing Neighbors, FNP  clonazePAM (KLONOPIN) 0.5 MG tablet Take 1 tablet (0.5 mg total) by mouth at bedtime. Patient not taking: Reported on 05/13/2017 04/21/17   Bing Neighbors, FNP  escitalopram (LEXAPRO) 10 MG tablet Take 1 tablet (10 mg total) by mouth at bedtime. Patient not taking: Reported on 05/13/2017 04/21/17   Bing Neighbors, FNP  HYDROcodone-acetaminophen (NORCO/VICODIN) 5-325 MG tablet Take 1 tablet by mouth every 4 (four) hours as needed for severe pain. Patient not taking: Reported on 05/13/2017 01/17/17   Trixie Dredge, PA-C  ibuprofen (ADVIL,MOTRIN) 800 MG tablet Take 1 tablet (800 mg total) by mouth every 8 (eight) hours as needed for mild pain or moderate pain. Patient not taking: Reported on 04/05/2017 01/17/17   Trixie Dredge, PA-C  LORazepam (ATIVAN) 1 MG tablet Take 1 tablet (1 mg total) by mouth 3 (three) times daily as needed for anxiety. Patient not taking: Reported on 05/13/2017 04/22/17   Charlestine Night, PA-C  meloxicam (MOBIC) 7.5 MG tablet Take 1 tablet (7.5 mg total) by mouth daily. 05/13/17   Cristina Gong, PA-C  oxyCODONE-acetaminophen (PERCOCET/ROXICET) 5-325 MG tablet Take 1-2 tablets by mouth every 4 (four) hours as needed for severe pain. Patient not taking: Reported on 02/28/2017 01/18/17   Lorre Nick, MD  traMADol (ULTRAM) 50 MG tablet Take 1 tablet (50 mg total) by mouth every 6 (six) hours as needed. Patient not taking: Reported on 01/18/2017 09/12/16   Charm Rings, MD    Family History Family History  Problem Relation Age of Onset  . Liver disease Mother   . Dementia Mother   .  Cancer Mother   . Cancer Maternal Grandmother     Social History Social History  Substance Use Topics  . Smoking status: Never Smoker  . Smokeless tobacco: Never Used  . Alcohol use No     Comment: 04/11/2016 "I'll have a glass of wine q 2-3 months"     Allergies   Zofran [ondansetron hcl]; Droperidol; Flexeril [cyclobenzaprine]; Morphine and related; and Tessalon [benzonatate]   Review of Systems Review of Systems  Musculoskeletal: Positive for arthralgias and myalgias.  Skin: Negative for color change and pallor.  All other systems reviewed and are negative.    Physical Exam Updated Vital Signs BP 107/75 (BP Location: Left Arm)  Pulse 85   Temp 98.4 F (36.9 C) (Oral)   Resp 15   SpO2 100%   Physical Exam  Constitutional: She appears well-developed and well-nourished. No distress.  HENT:  Head: Normocephalic and atraumatic.  Eyes: Conjunctivae are normal. Right eye exhibits no discharge. Left eye exhibits no discharge. No scleral icterus.  Neck: Normal range of motion.  Cardiovascular: Normal rate and regular rhythm.   Pulmonary/Chest: Effort normal. No stridor. No respiratory distress.  Abdominal: She exhibits no distension.  Musculoskeletal: She exhibits no edema or deformity.  The fingers and thumb of the patient's right hand at rest are fully flexed.  I am able to extend all of her fingers individually however this causes her pain in her distal forearm.  Her right hand is warm, well-perfused with intact pulses, cap refill under 2 seconds, no abnormal pallor.  She has sensation and feeling in her fingers however says that it does not feel normal.  Neurological: She is alert. She exhibits normal muscle tone.  Mental Status:  Alert, oriented, thought content appropriate, able to give a coherent history. Speech fluent without evidence of aphasia. Able to follow 2 step commands without difficulty.  Cranial Nerves:  II:  Peripheral visual fields grossly normal,  pupils equal, round, reactive to light III,IV, VI: ptosis not present, extra-ocular motions intact bilaterally  V,VII: smile symmetric, facial light touch sensation equal VIII: hearing grossly normal to voice  X: uvula elevates symmetrically  XI: bilateral shoulder shrug symmetric and strong XII: midline tongue extension without fassiculations Gait: normal gait and balance CV: distal pulses palpable throughout    Skin: Skin is warm and dry. She is not diaphoretic.  Psychiatric: She has a normal mood and affect. Her behavior is normal.  Nursing note and vitals reviewed.    ED Treatments / Results  Labs (all labs ordered are listed, but only abnormal results are displayed) Labs Reviewed - No data to display  EKG  EKG Interpretation None      Results from [facility] database   Date Dispensed/ Date Prescribed Drug Name/ NDC Qty. Dispensed/ Days Supply Refill #/ Authorized Refills Gaffer Recipient *Pmt. Method **MED Daily 05/10/2017 05/10/2017 HYDROCODONE- ACETAMIN 10- 325 MG [id number] 14 7 0 0 [id number] [facility] FRED W IV MD [place], [place] [id number] GATE [address] [place], [place], Katherinne 01/11/1966 [address] [place], [place] [address] 04 UNK 05/02/2017 05/02/2017 HYDROCODONE- ACETAMIN 10- 325 MG [id number] 21 7 0 0 [id number] [facility] FRED Ronna Polio MD [place], [place] [id number] GATE [address] [place], [place], Jadia 06/15/1966 [address] [place], [place] [address] 04 UNK 04/24/2017 04/24/2017 HYDROCODONE- ACETAMIN 10- 325 MG [id number] 21 7 0 0 [id number] [address] [place], [place] [id number] GATE [address] [place], P[place], Maricruz 12/27/1965 [address] [place], [place] [address] 04 UNK 04/22/2017 04/22/2017 LORAZEPAM 1 MG TABLET [id number] 15 5 0 0 [id number] LAWYER CHRISTOPHER [place], [place] [id number] GATE [address] [place], [place], Quatisha 12/23/1965 [address]  [address] [place], [place] [address] 04 0 04/21/2017 04/21/2017 CLONAZEPAM 0.5 MG TABLET [id number] 30 30 0 0 [id number] [address] [place], [place] [id number] [facility] [place], Vera LADESHA, PACINI 05/03/1966 [address] [place], [place] [address] 01 0 04/20/2017 02/28/2017 GABAPENTIN 300 MG CAPSULE [id number] 180 30 2 3  [id number] [address] [place], [place] [id number] [facility] [place], Buna [place], Krisinda 12/27/1965 [address] [place], [place] [address] 01 0 04/18/2017 04/18/2017 HYDROCODONE- ACETAMIN 10- 325 MG [id number] 14 7 0 0 [id number] [facility] St[facility] FRED Ronna PolioW IV MD [place], [place] [id number] GATE [address] [place], [place]LANIER, Roxane 02/09/1966 [address] [place], [place] [address] 04 UNK 04/10/2017 04/10/2017 HYDROCODONE- ACETAMIN 10- 325 MG [id number] 21 7 0 0 [id number] ORTMANN FRED Ronna PolioW IV MD [place] [id number] GATE [address] [place], [place] Whiteman AFBLANIER, Laykin 08/14/1966 [address] [place], [place] [address] 04 UNK 03/27/2017 03/27/2017 HYDROCODONE- ACETAMIN 10- 325 MG [id number] 28 7 0 0 [id number] [address]BARTON SAMANTHA BONHAM [place] [id number] GATE [address] [address] [place], [place] BeachLANIER, Ember 08/06/1966 [address] [place], [place] [address] 04 UNK 03/20/2017 03/20/2017 HYDROCODONE- ACETAMIN 10- 325 MG [id number] 28 7 0 0 [id number] [address]BARTON SAMANTHA BONHAM [address] [place], [place] [id number] GATE [address]ITY PHARMAC[facility] INC [place], [place] CampLANIER, Chantella 02/03/1966 [address] [place], [place] [address] 04 UNK 03/17/2017 03/17/2017 OXYCODONE- ACETAMINOPHEN 5- 325 [id number] 20 10 0 0 [id number] ORTMANN FRED Ronna PolioW IV MD [place], [place] [id number] GATE [address] [address] [place], [place] HeightsLANIER, Kambree 03/26/1966 [address] [place], [place] [address] 04 15 03/14/2017 03/14/2017 HYDROCODONE- ACETAMIN 10- 325 MG [id number] 28 7 0 0 [id number] ORTMANN FRED Ronna PolioW IV  MD [place], [place] [id number] GATE [address] [place], [place] KillonaLANIER, Chasmine 12/16/1965 [address] [place], [place] [address] 04 UNK 03/06/2017 01/18/2017 HYDROCODONE- ACETAMIN 10- 325 MG [id number] 28 7 0 0 [id number] [address]BARTON SAMANTHA BONHAM [place], [place] [id number] GATE [address] [place], [place] GladeLANIER, Quana 06/09/1966 [address] [place], [place] [address] 04 UNK 03/02/2017 03/02/2017 OXYCODONE- ACETAMINOPHEN 5- 325 [id number] 30 10 0 0 [id number] [address]BARTON SAMANTHA BONHAM [place], [place] [id number] GATE [address] [place], [place] SwaledaleLANIER, Ashli 07/05/1966 [address] [place], [place] [address] 04 22.5 02/27/2017 02/27/2017 HYDROCODONE- ACETAMIN 10- 325 MG [id number] 28 7 0 0 [id number] [address]B[facility]ON SAMANTHA BONH[facility] [place], [place] [id number] GATE [address] [place]GRE[place], [place] SalinasLANIER, Annjeanette 10/31/1965 [address] [place], [place] [address] 04 UNK 02/20/2017 02/20/2017 HYDROCODONE- ACETAMIN 10- 325 MG [id number] 28 7 0 0 [id number] [address]BARTON SAMANTHA BONHAM [place], [place] [id number] GATE [address]C[facility] PH[address] [place], [place]tonLANIER, Consetta 12/18/1965 [address] [place], [place] [address] [phone]    Radiology Dg Wrist Complete Right  Result Date: 05/13/2017 CLINICAL DATA:  Increased swelling and pain status post wrist surgery EXAM: RIGHT WRIST - COMPLETE 3+ VIEW COMPARISON:  01/17/2017 FINDINGS: There is no evidence of fracture or dislocation. There is no evidence of arthropathy or other focal bone abnormality. Soft tissues are unremarkable. IMPRESSION: Negative. Electronically Signed   By: Jasmine PangKim  Fujinaga M.D.   On: 05/13/2017 20:58    Procedures Procedures (including critical care time)  Medications Ordered in ED Medications - No data to display   Initial Impression / Assessment and Plan / ED Course  I have reviewed the triage vital signs and the nursing notes.  Pertinent labs & imaging results that were available during my care of the  patient were reviewed by me and considered in my medical decision making (see chart for details).    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  Her right hand and fingers are all warm and well perfused. Her sensation at her current baseline.  I am able to extend all of her fingers  however she does report feeling a tearing/pulling sensation. I spoke with hand surgery who recommended a resting volar splint and office follow up with pain meds as needed.  She does not have any obvious signs of infection, no abnormal erythema, obvious edema,crepitus, abnormal warmth, or pus drainage.  She is with out signs suggestive of a systemic infection.    Prior to writing for any controlled substances I queried the [place] controlled substance reporting system and found prescriptions as listed above.  Chart review shows that in the past 3 days she has gotten a prescription of hydrocodone acetaminophen 10mg  by Dr. Melvyn Novas which was designed to last her for 7 days.  Given her long history of hydrocodone rx I am uncomfortable giving her a rx for additional narcotic pain medication.  She also has rx shown for gabapentin and clonazepam.  She has been instructed to stop taking NSAIDs while she is taking meloxicam.  She does not have any headache, and the remainder of her neuro exam is non focal. Given her pathology to the area I suspect that she has a peripheral rather than central cause for her symptoms.   At this time there does not appear to be any evidence of an acute emergency medical condition and the patient appears stable for discharge with appropriate outpatient follow up.Diagnosis was discussed with patient who verbalizes understanding and is agreeable to discharge. Pt case discussed with Dr. Criss Alvine who saw the patient and agrees with my plan.    Final Clinical Impressions(s) / ED Diagnoses   Final diagnoses:  Right hand pain    New  Prescriptions Discharge Medication List as of 05/13/2017 11:14 PM    START taking these medications   Details  meloxicam (MOBIC) 7.5 MG tablet Take 1 tablet (7.5 mg total) by mouth daily., Starting Sat 05/13/2017, Print         Cristina Gong, PA-C 05/14/17 1100    Pricilla Loveless, MD 05/23/17 [phone]

## 2017-05-13 NOTE — Discharge Instructions (Signed)
Please wear your splint to keep your fingers from being too flexed.  You may take it off to shower and as needed. Please follow-up with your hand doctor on Tuesday.  Please stop taking all NSAIDs (Ibuprofen, Aleve, naproxen, Advil) while taking meloxicam.

## 2017-05-14 ENCOUNTER — Ambulatory Visit (HOSPITAL_COMMUNITY)
Admission: EM | Admit: 2017-05-14 | Discharge: 2017-05-14 | Disposition: A | Discharge status: Home / Self Care | Payer: Worker's Compensation | Source: Home / Self Care | Attending: Family Medicine | Admitting: Family Medicine

## 2017-05-14 ENCOUNTER — Encounter (HOSPITAL_COMMUNITY): Payer: Self-pay | Admitting: Emergency Medicine

## 2017-05-14 DIAGNOSIS — M79641 Pain in right hand: Secondary | ICD-10-CM | POA: Diagnosis not present

## 2017-05-14 DIAGNOSIS — F410 Panic disorder [episodic paroxysmal anxiety] without agoraphobia: Secondary | ICD-10-CM

## 2017-05-14 MED ORDER — TIZANIDINE HCL 4 MG PO CAPS: 4.0000 mg | ORAL_CAPSULE | Freq: Three times a day (TID) | ORAL | 0 refills | Status: DC

## 2017-05-14 NOTE — ED Triage Notes (Signed)
Pt here for right hand pain onset yest am.... Sx today include swelling and unable to open hand  [facility] ... Hx of right hand surgery   Has been seeing PT for this for an old inj.  Taking Ibup and Gabapentin for pain w/no relief... Also sts she had Vicodin yest w/no relief.   A&O x4... NAD... Ambulatory

## 2017-05-14 NOTE — Discharge Instructions (Signed)
[facility] [address] [place], [place] [address] [phone]  [facility] [address] [place], [place] [address] [phone]  [facility] [address] [place], [place] [address] [phone]  Dr. Andee PolesParish McKinney [address] [place], [place] [address] [phone]

## 2017-05-14 NOTE — ED Provider Notes (Signed)
[facility]    CSN: [id number] Arrival date & time: 05/14/17  1654     History   Chief Complaint Chief Complaint  Patient presents with  . Hand Pain    HPI Sabrina [name] is a 51 y.o. female.   HPI Pt has a hx of R wrist injury that is being managed by Hydrograph[employer] surveyor. She went to [facility] ED yesterday and was placed in a hand splint. Surgeon placed her on Medrol dosepak. She is already on Norco 10's that is not helping. The splint she was given caused her more pain and swelling. She has not used it since. Her fingers are numb and she feels they are cold. She also feels that the forearm muscles are contracting. She has a hx of anxiety and her panic attacks have been worsening as well. She has failed hydroxyzine, ativan and klonopin. She is interested in psychiatry.   Past Medical History:  Diagnosis Date  . Arthritis   . Bullous pemphigus   . Cat bite 06/2014   to left elbow  . History of blood transfusion 1988   "when I had my baby"  . 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 Poisoning, snake bite 04/08/2016   "[place]; RUE"  . PONV (postoperative nausea and vomiting)   . Situational anxiety   . Staph infection     Patient Active Problem List   Diagnosis Date Noted  . Right hand pain   . Wrist swelling   . Paresthesia   . Abscess of finger   . Cellulitis of right upper extremity 04/11/2016  . Cellulitis 04/11/2016  . Snake bite   . Medical non-compliance 10/05/2015  . Elbow pain 07/20/2015  . Anxiety state 07/20/2015  . Wound infection 07/04/2015  . Dehydration 07/04/2015  . Tachycardia 07/04/2015  . Diarrhea 07/04/2015  . Wound infection after surgery 07/04/2015  . 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HCC)   . Cat bite of forearm 07/01/2015  . Skin ulcer of upper arm, limited to breakdown of skin (HCC) 07/01/2015  . Cellulitis of upper arm 03/10/2015    Past Surgical History:  Procedure Laterality Date  . APPENDECTOMY  ~ 1987  . APPLICATION OF A-CELL OF EXTREMITY Left  08/05/2015   Procedure: APPLICATION OF A-CELL OF EXTREMITY;  Surgeon: Peggye Form, DO;  Location: [facility];  Service: Plastics;  Laterality: Left;  . BREAST SURGERY Right 1990   "mild duct taken out"  . DEBRIDEMENT AND CLOSURE WOUND Left 07/01/2015   Procedure: LEFT ELBOW EXCISION OF WOUND WITH PRIMARY CLOSURE 2X5 CM ;  Surgeon: Peggye Form, DO;  Location: [facility];  Service: Plastics;  Laterality: Left;  . ELBOW SURGERY  X 23 in [place] <06/2015   from a cat bite; all I&D  . I&D EXTREMITY Left 07/08/2015   Procedure: IRRIGATION AND DEBRIDEMENT EXTREMITY, DRAINAGE OF LEFT ARM WOUND, A-CELL PLACEMENT, WOUND VAC PLACEMENT;  Surgeon: Alena Bills Dillingham, DO;  Location: [facility];  Service: Plastics;  Laterality: Left;  . INCISION AND DRAINAGE OF WOUND Left 08/05/2015   Procedure: IRRIGATION AND DEBRIDEMENT LEFT ELBOW WOUND, PLACEMENT OF ACELL;  Surgeon: Peggye Form, DO;  Location:  SUR[facility]ERY CENTER;  Service: Plastics;  Laterality: Left;  . LAPAROSCOPIC CHOLECYSTECTOMY  1998  . SKIN GRAFT Left 2016   took from anterior thigh; placed at elbow  . TONSILLECTOMY  ~ 2000  . TOTAL ABDOMINAL HYSTERECTOMY  2003    Home Medications    Prior to  Admission medications   Medication Sig Start Date End Date Taking? Authorizing Provider  gabapentin (NEURONTIN) 300 MG capsule TAKE 2 CAPSULES BY MOUTH 3 TIMES DAILY 02/28/17  Yes Bing Neighbors, FNP  HYDROcodone-acetaminophen (NORCO) 10-325 MG tablet Take 1 tablet by mouth 3 (three) times daily as needed for severe pain.   Yes [provider]  meloxicam (MOBIC) 7.5 MG tablet Take 1 tablet (7.5 mg total) by mouth daily. 05/13/17  Yes Cristina Gong, PA-C  ALPRAZolam Prudy Feeler) 1 MG tablet Take 1 mg by mouth daily as needed for anxiety.    [provider]  methylPREDNISolone (MEDROL DOSEPAK) 4 MG TBPK tablet Take 4 mg by mouth as directed. Taper pack: 5,4,3,2,1    [provider]  tiZANidine (ZANAFLEX) 4 MG capsule Take 1 capsule (4 mg total) by mouth 3 (three) times daily. 05/14/17   Sharlene Dory, DO    Family History Family History  Problem Relation Age of Onset  . Liver disease Mother   . Dementia Mother   . Cancer Mother   . Cancer Maternal Grandmother     Social History Social History  Substance Use Topics  . Smoking status: Never Smoker  . Smokeless tobacco: Never Used  . Alcohol use No     Comment: 04/11/2016 "I'll have a glass of wine q 2-3 months"     Allergies   Zofran [ondansetron hcl]; Droperidol; Flexeril [cyclobenzaprine]; Morphine and related; and Tessalon [benzonatate]   Review of Systems Review of Systems  Musculoskeletal:       As noted in HPI  Neurological: Positive for numbness.     Physical Exam Triage Vital Signs ED Triage Vitals  Enc Vitals Group     BP 05/14/17 1739 102/61     Pulse Rate 05/14/17 1739 98     Resp 05/14/17 1739 20     Temp 05/14/17 1739 98.8 F (37.1 C)     Temp Source 05/14/17 1739 Oral     SpO2 05/14/17 1739 100 %     Pain Score 05/14/17 1740 7   Updated Vital Signs BP 102/61 (BP Location: Left Arm)   Pulse 98   Temp 98.8 F (37.1 C) (Oral)   Resp 20   SpO2 100%  Physical Exam  Constitutional: She is oriented to person, place, and time. She appears well-developed and well-nourished.  HENT:  Head: Normocephalic and atraumatic.  Musculoskeletal:  Grip strength weak on R, TTP over wrist/finger flexors in forearm compartment  Neurological: She is alert and oriented to person, place, and time.  Decreased sensation over distal digits on R hand  Skin: Skin is warm and dry. Capillary refill takes less than 2 seconds. No erythema.  Psychiatric: She has a normal mood and affect. Judgment normal.     UC Treatments / Results  Procedures Procedures none  Initial Impression / Assessment and Plan / UC Course  I have reviewed the triage vital signs and the nursing  notes.  Pertinent labs & imaging results that were available during my care of the patient were reviewed by me and considered in my medical decision making (see chart for details).     51 yo F presents for an ongoing issue related to her wrist. I told her that we can try a muscle relaxant, but she will need to continue to follow with her surgeon for definitive management. If splint giving that much issue, OK to stay out of until she follows up. No red flag signs/symptoms today. Psych  resources provided. I do not want to rx benzos as she is taking opioids currently. She understands she needs to follow up with psych or PCP for better management of her panic attacks. She voiced understanding and agreement to the plan.  Final Clinical Impressions(s) / UC Diagnoses   Final diagnoses:  Right hand pain    New Prescriptions Discharge Medication List as of 05/14/2017  6:11 PM    START taking these medications   Details  tiZANidine (ZANAFLEX) 4 MG capsule Take 1 capsule (4 mg total) by mouth 3 (three) times daily., Starting Sun 05/14/2017, Print         Controlled Substance Prescriptions [place] Controlled Substance Registry consulted? Not Applicable   Sharlene DoryWendling, Tammi Boulier Paul, [place]DO 05/14/17 2158

## 2017-05-17 MED FILL — GABAPENTIN 300 MG CAPSULE: 300 | 30 days supply | Qty: 180 | Fill #3

## 2017-05-22 ENCOUNTER — Ambulatory Visit (INDEPENDENT_AMBULATORY_CARE_PROVIDER_SITE_OTHER): Payer: Self-pay | Admitting: Family Medicine

## 2017-05-22 ENCOUNTER — Encounter: Payer: Self-pay | Admitting: Family Medicine

## 2017-05-22 VITALS — BP 104/68 | HR 80 | Temp 98.3°F | Resp 16 | Ht 64.0 in | Wt 187.0 lb

## 2017-05-22 DIAGNOSIS — F419 Anxiety disorder, unspecified: Secondary | ICD-10-CM

## 2017-05-22 DIAGNOSIS — F329 Major depressive disorder, single episode, unspecified: Secondary | ICD-10-CM

## 2017-05-22 MED ORDER — CLONAZEPAM 0.5 MG PO TABS: 0.5000 mg | ORAL_TABLET | Freq: Two times a day (BID) | ORAL | 0 refills | Status: DC

## 2017-05-22 MED FILL — clonazePAM 0.5 MG TABS: 0.5 | 30 days supply | Qty: 60 | Fill #0

## 2017-05-22 NOTE — Progress Notes (Signed)
Patient ID: Sabrina [name], female    DOB: 03/09/1966, 51 y.o.   MRN: [id number]  PCP: Bing Neighbors, FNP  Chief Complaint  Patient presents with  . Follow-up    4 WEEK ON MEDICATION    Subjective:  HPI Sabrina [name] is a 51 y.o. female presents for evaluation of anxiety and medication refill. Ereka was last seen in office for situational anxiety and insomnia on 04/21/2017. She had tried and failed treatment with trazodone, buspar, and escitalopram. During her last visit, she was prescribed clonazepam which she reports over the last few weeks has work effectively to control her symptoms. Gennette reports an upcoming appointment with [facility] behavioral health for counseling services. She reports that Vesta Mixer is having a difficulty getting her in the office. Aiysha denies thoughts of suicide or harming others. She requests a refill of Clonazepam and has not other complaints today. Social History   Social History  . Marital status: Divorced    Spouse name: N/A  . Number of children: N/A  . Years of education: N/A   Occupational History  . Not on file.   Social History Main Topics  . Smoking status: Never Smoker  . Smokeless tobacco: Never Used  . Alcohol use No     Comment: 04/11/2016 "I'll have a glass of wine q 2-3 months"  . Drug use: No  . Sexual activity: Not Currently   Other Topics Concern  . Not on file   Social History Narrative  . No narrative on file    Family History  Problem Relation Age of Onset  . Liver disease Mother   . Dementia Mother   . Cancer Mother   . Cancer Maternal Grandmother    Review of Systems See HPI Patient Active Problem List   Diagnosis Date Noted  . Right hand pain   . Wrist swelling   . Paresthesia   . Abscess of finger   . Cellulitis of right upper extremity 04/11/2016  . Cellulitis 04/11/2016  . Snake bite   . Medical non-compliance 10/05/2015  . Elbow pain 07/20/2015  . Anxiety state 07/20/2015  . Wound infection  07/04/2015  . Dehydration 07/04/2015  . Tachycardia 07/04/2015  . Diarrhea 07/04/2015  . Wound infection after surgery 07/04/2015  . 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HCC)   . Cat bite of forearm 07/01/2015  . Skin ulcer of upper arm, limited to breakdown of skin (HCC) 07/01/2015  . Cellulitis of upper arm 03/10/2015    Allergies  Allergen Reactions  . Zofran [Ondansetron Hcl] Hives and Other (See Comments)    Spots around iv site  . Droperidol Palpitations  . Flexeril [Cyclobenzaprine] Anxiety  . Morphine And Related Itching  . Tessalon [Benzonatate] Other (See Comments)    Watery eyes    Prior to Admission medications   Medication Sig Start Date End Date Taking? Authorizing Provider  clonazePAM (KLONOPIN) 0.5 MG tablet Take 0.5 mg by mouth daily.   Yes [provider]  gabapentin (NEURONTIN) 300 MG capsule TAKE 2 CAPSULES BY MOUTH 3 TIMES DAILY 02/28/17  Yes Bing Neighbors, FNP  tiZANidine (ZANAFLEX) 4 MG capsule Take 1 capsule (4 mg total) by mouth 3 (three) times daily. 05/14/17  Yes Wendling, Jilda Roche, DO  ALPRAZolam Prudy Feeler) 1 MG tablet Take 1 mg by mouth daily as needed for anxiety.    [provider]  HYDROcodone-acetaminophen (NORCO) 10-325 MG tablet Take 1 tablet by mouth 3 (three) times daily as needed for severe  pain.    [provider]  meloxicam (MOBIC) 7.5 MG tablet Take 1 tablet (7.5 mg total) by mouth daily. 05/13/17   Cristina GongHammond, Elizabeth W, PA-C    Past Medical, Surgical Family and Social History reviewed and updated.    Objective:   Today's Vitals   05/22/17 0950  BP: 104/68  Pulse: 80  Resp: 16  Temp: 98.3 F (36.8 C)  TempSrc: Oral  SpO2: 100%  Weight: 187 lb (84.8 kg)  Height: 5\' 4"  (1.626 m)    Wt Readings from Last 3 Encounters:  05/22/17 187 lb (84.8 kg)  04/21/17 185 lb 8 oz (84.1 kg)  04/05/17 184 lb (83.5 kg)   Physical Exam  Constitutional: She is oriented to person, place, and time. She appears well-developed and  well-nourished.  Cardiovascular: Normal rate, regular rhythm, normal heart sounds and intact distal pulses.   Pulmonary/Chest: Effort normal and breath sounds normal.  Neurological: She is alert and oriented to person, place, and time.  Skin: Skin is warm.  Psychiatric: Her behavior is normal. Judgment and thought content normal. Her mood appears anxious.   1. Anxiety and depression -Continue clonazepam  -Keep follow-up with [facility] as I strongly encourages counseling services adjunctive therapy   RTC: 4 weels medication management   Godfrey PickKimberly S. Tiburcio PeaHarris, MSN, FNP-C [facility]  [address] [place], [place] [address] [phone]

## 2017-05-22 NOTE — Patient Instructions (Signed)

## 2017-05-31 MED FILL — busPIRone HCL 10 MG TABS: 10 | 30 days supply | Qty: 90 | Fill #0

## 2017-06-09 ENCOUNTER — Other Ambulatory Visit: Payer: Self-pay | Admitting: Family Medicine

## 2017-06-09 ENCOUNTER — Telehealth: Payer: Self-pay

## 2017-06-09 DIAGNOSIS — G609 Hereditary and idiopathic neuropathy, unspecified: Secondary | ICD-10-CM

## 2017-06-09 MED FILL — GABAPENTIN 300 MG CAPSULE: 300 | 30 days supply | Qty: 180 | Fill #0

## 2017-06-12 MED ORDER — GABAPENTIN 300 MG PO CAPS: 600.0000 mg | ORAL_CAPSULE | Freq: Three times a day (TID) | ORAL | 3 refills | Status: DC

## 2017-06-12 NOTE — Telephone Encounter (Signed)
Refilled gabapentin

## 2017-06-14 ENCOUNTER — Telehealth: Payer: Self-pay

## 2017-06-14 NOTE — Telephone Encounter (Signed)
Spoke with patient and advise her to go to the urgent care if her cough get worse before she comes in next week.

## 2017-06-19 ENCOUNTER — Encounter: Payer: Self-pay | Admitting: Family Medicine

## 2017-06-19 ENCOUNTER — Ambulatory Visit (INDEPENDENT_AMBULATORY_CARE_PROVIDER_SITE_OTHER): Payer: Self-pay | Admitting: Family Medicine

## 2017-06-19 VITALS — BP 105/74 | HR 92 | Temp 98.6°F | Resp 16 | Ht 64.0 in | Wt 185.0 lb

## 2017-06-19 DIAGNOSIS — F419 Anxiety disorder, unspecified: Secondary | ICD-10-CM

## 2017-06-19 DIAGNOSIS — F329 Major depressive disorder, single episode, unspecified: Secondary | ICD-10-CM

## 2017-06-19 MED ORDER — CLONAZEPAM 0.5 MG PO TABS: 0.5000 mg | ORAL_TABLET | Freq: Two times a day (BID) | ORAL | 0 refills | Status: DC

## 2017-06-19 MED FILL — clonazePAM 0.5 MG TABS: 0.5 | 30 days supply | Qty: 60 | Fill #0

## 2017-06-19 NOTE — Progress Notes (Signed)
Patient ID: Sabrina [name], female    DOB: May 03, 1966, 52 y.o.   MRN: [id number]  PCP: Bing Neighbors, FNP  Chief Complaint  Patient presents with  . Anxiety and Depression    medication management      Subjective:  HPI Sabrina [name] is a 51 y.o. female presents for medication management. [name] has been seen routinely over the last several weeks for management of depression and anxiety until she was able to establish with a behavioral health provider. Symptoms management currently consists of gabapentin and clonazepam which has made management of anxiety and depression more manageable. She has a behavioral health visit scheduled with [facility] next month and will obtain any further prescriptions through the mental health provider she established with there. Social History   Social History  . Marital status: Divorced    Spouse name: N/A  . Number of children: N/A  . Years of education: N/A   Occupational History  . Not on file.   Social History Main Topics  . Smoking status: Never Smoker  . Smokeless tobacco: Never Used  . Alcohol use No     Comment: 04/11/2016 "I'll have a glass of wine q 2-3 months"  . Drug use: No  . Sexual activity: Not Currently   Other Topics Concern  . Not on file   Social History Narrative  . No narrative on file    Family History  Problem Relation Age of Onset  . Liver disease Mother   . Dementia Mother   . Cancer Mother   . Cancer Maternal Grandmother    Review of Systems See HPI Patient Active Problem List   Diagnosis Date Noted  . Right hand pain   . Wrist swelling   . Paresthesia   . Abscess of finger   . Cellulitis of right upper extremity 04/11/2016  . Cellulitis 04/11/2016  . Snake bite   . Medical non-compliance 10/05/2015  . Elbow pain 07/20/2015  . Anxiety state 07/20/2015  . Wound infection 07/04/2015  . Dehydration 07/04/2015  . Tachycardia 07/04/2015  . Diarrhea 07/04/2015  . Wound infection after surgery  07/04/2015  . 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HCC)   . Cat bite of forearm 07/01/2015  . Skin ulcer of upper arm, limited to breakdown of skin (HCC) 07/01/2015  . Cellulitis of upper arm 03/10/2015    Allergies  Allergen Reactions  . Zofran [Ondansetron Hcl] Hives and Other (See Comments)    Spots around iv site  . Droperidol Palpitations  . Flexeril [Cyclobenzaprine] Anxiety  . Morphine And Related Itching  . Tessalon [Benzonatate] Other (See Comments)    Watery eyes    Prior to Admission medications   Medication Sig Start Date End Date Taking? Authorizing Provider  clonazePAM (KLONOPIN) 0.5 MG tablet Take 1 tablet (0.5 mg total) by mouth 2 (two) times daily. 05/22/17  Yes Bing Neighbors, FNP  gabapentin (NEURONTIN) 300 MG capsule Take 2 capsules (600 mg total) by mouth 3 (three) times daily. 06/12/17  Yes Bing Neighbors, FNP  HYDROcodone-acetaminophen (NORCO) 10-325 MG tablet Take 1 tablet by mouth 3 (three) times daily as needed for severe pain.   Yes [provider]  ALPRAZolam Prudy Feeler) 1 MG tablet Take 1 mg by mouth daily as needed for anxiety.    [provider]  meloxicam (MOBIC) 7.5 MG tablet Take 1 tablet (7.5 mg total) by mouth daily. Patient not taking: Reported on 06/19/2017 05/13/17   Cristina Gong, PA-C  tiZANidine (ZANAFLEX)  4 MG capsule Take 1 capsule (4 mg total) by mouth 3 (three) times daily. Patient not taking: Reported on 06/19/2017 05/14/17   Sharlene Dory, DO    Past Medical, Surgical Family and Social History reviewed and updated.    Objective:   Today's Vitals   06/19/17 1019  BP: 105/74  Pulse: 92  Resp: 16  Temp: 98.6 F (37 C)  TempSrc: Oral  SpO2: 100%  Weight: 185 lb (83.9 kg)  Height:  (1.626 m)    Wt Readings from Last 3 Encounters:  06/19/17 185 lb (83.9 kg)  05/22/17 187 lb (84.8 kg)  04/21/17 185 lb 8 oz (84.1 kg)   Physical Exam  Constitutional: She is oriented to person, place, and time. She  appears well-developed and well-nourished.  HENT:  Head: Normocephalic and atraumatic.  Eyes: Pupils are equal, round, and reactive to light. Conjunctivae and EOM are normal.  Neck: Normal range of motion. Neck supple.  Cardiovascular: Normal rate, regular rhythm, normal heart sounds and intact distal pulses.   Pulmonary/Chest: Effort normal and breath sounds normal.  Neurological: She is alert and oriented to person, place, and time. Coordination normal.  Skin: Skin is warm and dry.  Psychiatric: She has a normal mood and affect. Her behavior is normal. Judgment and thought content normal.   Assessment & Plan:  1. Anxiety and depression, chronic and stable  -Continue clonazepam and gabapentin as prescribed.  RTC: 6 months for routine follow-up, sooner if needed.  Godfrey Pick. Tiburcio Pea, MSN, FNP-C [facility]  [address] [place], [place] [address] [phone]

## 2017-06-30 MED FILL — busPIRone HCL 10 MG TABS: 10 | 30 days supply | Qty: 90 | Fill #1

## 2017-07-04 MED FILL — GABAPENTIN 300 MG CAPSULE: 300 | 30 days supply | Qty: 180 | Fill #1

## 2017-07-19 ENCOUNTER — Telehealth: Payer: Self-pay

## 2017-07-19 MED ORDER — CLONAZEPAM 0.5 MG PO TABS: 0.5000 mg | ORAL_TABLET | Freq: Two times a day (BID) | ORAL | 0 refills | Status: DC

## 2017-07-19 NOTE — Telephone Encounter (Signed)
Agreed to refill medication for patient. [facility] as she was told previous, I would only agree to continue Klonopin on a limited basis while she was being schedule to see psychologist that agreed to accept her as a patient. If she is interest, I can also refer her to neuropsychiatric care for further management of her anxiety. Prescription and signed and she can advise of the pharmacy she prefers medication to be faxed to.  Godfrey PickKimberly S. Tiburcio PeaHarris, MSN, FNP-C [facility]  [address] [place], [place] [address] [phone]

## 2017-07-20 MED FILL — clonazePAM 0.5 MG TABS: 0.5 | 30 days supply | Qty: 60 | Fill #0

## 2017-07-20 NOTE — Telephone Encounter (Signed)
Patient notified and states that she is having another wrist surgery and should be able to return back to work in December part time.

## 2017-07-31 MED FILL — GABAPENTIN 300 MG CAPSULE: 300 | 30 days supply | Qty: 180 | Fill #2

## 2017-08-24 MED FILL — GABAPENTIN 300 MG CAPS: 300 | 30 days supply | Qty: 180 | Fill #3

## 2017-08-28 ENCOUNTER — Ambulatory Visit (INDEPENDENT_AMBULATORY_CARE_PROVIDER_SITE_OTHER): Payer: Self-pay | Admitting: Family Medicine

## 2017-08-28 ENCOUNTER — Encounter: Payer: Self-pay | Admitting: Family Medicine

## 2017-08-28 VITALS — BP 104/68 | HR 100 | Temp 98.4°F | Resp 16 | Ht 64.0 in | Wt 189.4 lb

## 2017-08-28 DIAGNOSIS — J069 Acute upper respiratory infection, unspecified: Secondary | ICD-10-CM

## 2017-08-28 MED ORDER — PROMETHAZINE-CODEINE 6.25-10 MG/5ML PO SYRP: 7.5000 mL | ORAL_SOLUTION | Freq: Four times a day (QID) | ORAL | 0 refills | Status: DC | PRN

## 2017-08-28 MED ORDER — PROMETHAZINE-CODEINE 6.25-10 MG/5ML PO SYRP: 5.0000 mL | ORAL_SOLUTION | Freq: Four times a day (QID) | ORAL | 0 refills | Status: DC | PRN

## 2017-08-28 MED ORDER — IPRATROPIUM BROMIDE 0.03 % NA SOLN: 2.0000 | Freq: Two times a day (BID) | NASAL | 0 refills | Status: DC

## 2017-08-28 MED FILL — PROMETHAZINE-CODEINE SYRUP: 6.25-10 | 6 days supply | Qty: 120 | Fill #0

## 2017-08-28 NOTE — Patient Instructions (Signed)
   Upper Respiratory Infection, Adult Most upper respiratory infections (URIs) are caused by a virus. A URI affects the nose, throat, and upper air passages. The most common type of URI is often called "the common cold." Follow these instructions at home:  Take medicines only as told by your doctor.  Gargle warm saltwater or take cough drops to comfort your throat as told by your doctor.  Use a warm mist humidifier or inhale steam from a shower to increase air moisture. This may make it easier to breathe.  Drink enough fluid to keep your pee (urine) clear or pale yellow.  Eat soups and other clear broths.  Have a healthy diet.  Rest as needed.  Go back to work when your fever is gone or your doctor says it is okay. ? You may need to stay home longer to avoid giving your URI to others. ? You can also wear a face mask and wash your hands often to prevent spread of the virus.  Use your inhaler more if you have asthma.  Do not use any tobacco products, including cigarettes, chewing tobacco, or electronic cigarettes. If you need help quitting, ask your doctor. Contact a doctor if:  You are getting worse, not better.  Your symptoms are not helped by medicine.  You have chills, fever, wheezing, or chest tightness  You are getting more short of breath.  You have brown or red mucus.  You have yellow or brown discharge from your nose.  You have pain in your face, especially when you bend forward.  You have a fever.  You have puffy (swollen) neck glands.  You have pain while swallowing.  You have white areas in the back of your throat. Get help right away if:  You have very bad or constant: ? Headache. ? Ear pain. ? Pain in your forehead, behind your eyes, and over your cheekbones (sinus pain). ? Chest pain.  You have long-lasting (chronic) lung disease and any of the following: ? Wheezing. ? Long-lasting cough. ? Coughing up blood. ? A change in your usual  mucus.  You have a stiff neck.  You have changes in your: ? Vision. ? Hearing. ? Thinking. ? Mood. This information is not intended to replace advice given to you by your health care provider. Make sure you discuss any questions you have with your health care provider. Document Released: 02/15/2008 Document Revised: 05/01/2016 Document Reviewed: 12/04/2013 Elsevier Interactive Patient Education  2018 [employer]

## 2017-08-28 NOTE — Progress Notes (Signed)
Patient ID: Sabrina JabsDana L [name], female    DOB: 05/28/1966, 51 y.o.   MRN: [id number]  PCP: Bing NeighborsHarris, Casha Estupinan S, FNP  Chief Complaint  Patient presents with  . Cough  . Fever  . Headache    that comes and go    Subjective:  HPI Sabrina [name] is a 51 y.o. female presents for evaluation x 3 days of presumptive bronchitis.  Current symptoms include headache, cough, hoarseness of voice, measurable fever 100 F times one day ago. Reports a productive cough with colored sputum (green and yellow). Denies wheezing, shortness of breath, nausea, vomiting, headache, nasal drainage, or body aches. Sabrina [name] feels that she is experiencing early symptoms of bronchitis.  Social History   Socioeconomic History  . Marital status: Divorced    Spouse name: Not on file  . Number of children: Not on file  . Years of education: Not on file  . Highest education level: Not on file  Social Needs  . Financial resource strain: Not on file  . Food insecurity - worry: Not on file  . Food insecurity - inability: Not on file  . Transportation needs - medical: Not on file  . Transportation needs - non-medical: Not on file  Occupational History  . Not on file  Tobacco Use  . Smoking status: Never Smoker  . Smokeless tobacco: Never Used  Substance and Sexual Activity  . Alcohol use: No    Alcohol/week: 0.0 oz    Comment: 04/11/2016 "I'll have a glass of wine q 2-3 months"  . Drug use: No  . Sexual activity: Not Currently  Other Topics Concern  . Not on file  Social History Narrative  . Not on file    Family History  Problem Relation Age of Onset  . Liver disease Mother   . Dementia Mother   . Cancer Mother   . Cancer Maternal Grandmother     Review of Systems  Constitutional: Positive for fever.  HENT: Positive for congestion. Negative for postnasal drip, rhinorrhea and sore throat.   Respiratory: Negative.   Cardiovascular: Negative.   Neurological: Negative.  Negative for headaches.   Psychiatric/Behavioral: Negative.     Patient Active Problem List   Diagnosis Date Noted  . Right hand pain   . Wrist swelling   . Paresthesia   . Abscess of finger   . Cellulitis of right upper extremity 04/11/2016  . Cellulitis 04/11/2016  . Snake bite   . Medical non-compliance 10/05/2015  . Elbow pain 07/20/2015  . Anxiety state 07/20/2015  . Wound infection 07/04/2015  . Dehydration 07/04/2015  . Tachycardia 07/04/2015  . Diarrhea 07/04/2015  . Wound infection after surgery 07/04/2015  . 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HCC)   . Cat bite of forearm 07/01/2015  . Skin ulcer of upper arm, limited to breakdown of skin (HCC) 07/01/2015  . Cellulitis of upper arm 03/10/2015    Allergies  Allergen Reactions  . Zofran [Ondansetron Hcl] Hives and Other (See Comments)    Spots around iv site  . Droperidol Palpitations  . Flexeril [Cyclobenzaprine] Anxiety  . Morphine And Related Itching  . Tessalon [Benzonatate] Other (See Comments)    Watery eyes    Prior to Admission medications   Medication Sig Start Date End Date Taking? Authorizing Provider  gabapentin (NEURONTIN) 300 MG capsule Take 2 capsules (600 mg total) by mouth 3 (three) times daily. 06/12/17  Yes Bing NeighborsHarris, Orville Widmann S, FNP  clonazePAM (KLONOPIN) 0.5 MG tablet Take 1 tablet (0.5 mg  total) 2 (two) times daily by mouth. Patient not taking: Reported on 08/28/2017 07/19/17   Bing NeighborsHarris, Destyn Schuyler S, FNP  HYDROcodone-acetaminophen [facility](NORCO) 10-325 MG tablet Take 1 tablet by mouth 3 (three) times daily as needed for severe pain.    [provider]    Past Medical, Surgical Family and Social History reviewed and updated.    Objective:   Today's Vitals   08/28/17 1019  BP: 104/68  Pulse: 100  Resp: 16  Temp: 98.4 F (36.9 C)  TempSrc: Oral  SpO2: 100%  Weight: 189 lb 6.4 oz (85.9 kg)  Height: 5\' 4"  (1.626 m)    Wt Readings from Last 3 Encounters:  08/28/17 189 lb 6.4 oz (85.9 kg)  06/19/17 185 lb (83.9 kg)   05/22/17 187 lb (84.8 kg)   Physical Exam  Constitutional: She is oriented to person, place, and time. She appears well-developed and well-nourished. She does not appear ill.  HENT:  Head: Normocephalic and atraumatic.  Right Ear: Hearing, tympanic membrane, external ear and ear canal normal.  Left Ear: Hearing, tympanic membrane, external ear and ear canal normal.  Nose: Nose normal.  Mouth/Throat: Uvula is midline, oropharynx is clear and moist and mucous membranes are normal.  Eyes: Conjunctivae and EOM are normal. Pupils are equal, round, and reactive to light.  Neck: No thyromegaly present.  Pulmonary/Chest: Effort normal and breath sounds normal. No respiratory distress. She has no wheezes. She exhibits no tenderness.  Abdominal: Soft. Bowel sounds are normal.  Musculoskeletal: Normal range of motion.  Lymphadenopathy:    She has no cervical adenopathy.  Neurological: She is alert and oriented to person, place, and time.  Skin: Skin is warm and dry.  Psychiatric: She has a normal mood and affect. Her behavior is normal. Thought content normal.   Assessment & Plan:  1. Upper respiratory tract infection, unspecified type, likely viral. Symptomatic treatment indicated only. Explained that current course of illness doesn't warrant a course of antibiotic.  -Atrovent nasal spray for congestion. -Phenergan with codeine as needed for cough  If symptoms do improve within 7-10 days for return for care .   Meds ordered this encounter  Medications  . ipratropium (ATROVENT) 0.03 % nasal spray    Sig: Place 2 sprays into both nostrils 2 (two) times daily.    Dispense:  30 mL    Refill:  0    Order Specific Question:   Supervising Provider    Answer:   Quentin AngstJEGEDE, OLUGBEMIGA E [id number]]  . DISCONTD: promethazine-codeine (PHENERGAN WITH CODEINE) 6.25-10 MG/5ML syrup    Sig: Take 7.5 mLs by mouth every 6 (six) hours as needed for cough.    Dispense:  120 mL    Refill:  0    Order Specific  Question:   Supervising Provider    Answer:   Quentin AngstJEGEDE, OLUGBEMIGA E [id number]]  . promethazine-codeine (PHENERGAN WITH CODEINE) 6.25-10 MG/5ML syrup    Sig: Take 5 mLs by mouth every 6 (six) hours as needed for cough.    Dispense:  120 mL    Refill:  0    Order Specific Question:   Supervising Provider    Answer:   Quentin AngstJEGEDE, OLUGBEMIGA E [[id number]]    -The patient was given clear instructions to go to ER or return to medical center if symptoms do not improve, worsen or new problems develop. The patient verbalized understanding.   Godfrey PickKimberly S. Tiburcio PeaHarris, MSN, FNP-C [facility]  [address] [place], [place]  [address] [phone]   

## 2017-09-03 ENCOUNTER — Encounter (HOSPITAL_COMMUNITY): Payer: Self-pay | Admitting: Emergency Medicine

## 2017-09-03 ENCOUNTER — Emergency Department (HOSPITAL_COMMUNITY)
Admission: EM | Admit: 2017-09-03 | Discharge: 2017-09-03 | Disposition: A | Discharge status: Home / Self Care | Payer: Self-pay | Attending: Emergency Medicine | Admitting: Emergency Medicine

## 2017-09-03 DIAGNOSIS — F419 Anxiety disorder, unspecified: Secondary | ICD-10-CM | POA: Insufficient documentation

## 2017-09-03 MED ORDER — LORAZEPAM 1 MG PO TABS: 1.0000 mg | ORAL_TABLET | Freq: Three times a day (TID) | ORAL | 0 refills | Status: DC | PRN

## 2017-09-03 NOTE — ED Triage Notes (Signed)
Patient c/o increased panic attacks x2 days. Reports her father is dying in [place] and believes that is a contributing factor. Reports she was on klonopin previously with no relief. Denies taking any medication for anxiety at this time. Denies SI/HIA/V/H.

## 2017-09-03 NOTE — Discharge Instructions (Signed)
Please follow-up with a mental health professional as soon as possible on this matter.  Refer to the attached resource guide in this matter. You have been prescribed a very small amount of ativan for your acute onset of anxiety and panic attacks in the setting of an acute stressful event. Please do not drive or perform other dangerous activities while taking this medication. Return to the ED should you begin to have thoughts of suicide or self-harm.

## 2017-09-03 NOTE — ED Provider Notes (Signed)
[facility]-EMERGENCY DEPT Provider Note   CSN: [id number] Arrival date & time: 09/03/17  1501     History   Chief Complaint Chief Complaint  Patient presents with  . Anxiety    HPI Sabrina [name] is a 51 y.o. female.  HPI  Sabrina [name] is a 51 y.o. female, with a history of anxiety, presenting to the ED with increased frequency of panic attacks with three over the past two days.  States she has been under increased stress due to her father being ill.  Also endorses insomnia over the past two days.  Has tried melatonin and relaxation techniques. Has tried hydroxyzine, but states it makes her "jittery" like benadryl.  She adds she cannot take Ambien due to sleepwalking and hallucinations.  Previously taking klonopin, but states it doesn't help anyway. States she took alprazolam years ago that helped and inquires about being prescribed this or something similar.  States she has not currently followed by a psychiatrist due to lack of insurance.  Denies SI/HI, A/V hallucinations, chest pain, shortness of breath, abdominal pain, or any other complaints.    Past Medical History:  Diagnosis Date  . Arthritis   . Bullous pemphigus   . Cat bite 06/2014   to left elbow  . History of blood transfusion 1988   "when I had my baby"  . 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 Poisoning, snake bite 04/08/2016   "[place]; RUE"  . PONV (postoperative nausea and vomiting)   . Situational anxiety   . Staph infection     Patient Active Problem List   Diagnosis Date Noted  . Right hand pain   . Wrist swelling   . Paresthesia   . Abscess of finger   . Cellulitis of right upper extremity 04/11/2016  . Cellulitis 04/11/2016  . Snake bite   . Medical non-compliance 10/05/2015  . Elbow pain 07/20/2015  . Anxiety state 07/20/2015  . Wound infection 07/04/2015  . Dehydration 07/04/2015  . Tachycardia 07/04/2015  . Diarrhea 07/04/2015  . Wound infection after surgery  07/04/2015  . 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HCC)   . Cat bite of forearm 07/01/2015  . Skin ulcer of upper arm, limited to breakdown of skin (HCC) 07/01/2015  . Cellulitis of upper arm 03/10/2015    Past Surgical History:  Procedure Laterality Date  . APPENDECTOMY  ~ 1987  . APPLICATION OF A-CELL OF EXTREMITY Left 08/05/2015   Procedure: APPLICATION OF A-CELL OF EXTREMITY;  Surgeon: Peggye Formlaire S Dillingham, DO;  Location: [facility];  Service: Plastics;  Laterality: Left;  . BREAST SURGERY Right 1990   "mild duct taken out"  . DEBRIDEMENT AND CLOSURE WOUND Left 07/01/2015   Procedure: LEFT ELBOW EXCISION OF WOUND WITH PRIMARY CLOSURE 2X5 CM ;  Surgeon: Peggye Formlaire S Dillingham, DO;  Location: [facility];  Service: Plastics;  Laterality: Left;  . ELBOW SURGERY  X 23 in [place] <06/2015   from a cat bite; all I&D  . I&D EXTREMITY Left 07/08/2015   Procedure: IRRIGATION AND DEBRIDEMENT EXTREMITY, DRAINAGE OF LEFT ARM WOUND, A-CELL PLACEMENT, WOUND VAC PLACEMENT;  Surgeon: Alena Billslaire S Dillingham, DO;  Location: [facility];  Service: Plastics;  Laterality: Left;  . INCISION AND DRAINAGE OF WOUND Left 08/05/2015   Procedure: IRRIGATION AND DEBRIDEMENT LEFT ELBOW WOUND, PLACEMENT OF ACELL;  Surgeon: Peggye Formlaire S Dillingham, DO;  Location: [facility];  Service: Plastics;  Laterality: Left;  . LAPAROSCOPIC CHOLECYSTECTOMY  1998  .  SKIN GRAFT Left 2016   took from anterior thigh; placed at elbow  . TONSILLECTOMY  ~ 2000  . TOTAL ABDOMINAL HYSTERECTOMY  2003    OB History    No data available       Home Medications    Prior to Admission medications   Medication Sig Start Date End Date Taking? Authorizing Provider  clonazePAM (KLONOPIN) 0.5 MG tablet Take 1 tablet (0.5 mg total) 2 (two) times daily by mouth. Patient not taking: Reported on 08/28/2017 07/19/17   Bing NeighborsHarris, Kimberly S, FNP  gabapentin (NEURONTIN) 300 MG capsule Take 2 capsules (600 mg total) by mouth 3 (three)  times daily. 06/12/17   Bing NeighborsHarris, Kimberly S, FNP  HYDROcodone-acetaminophen (NORCO) 10-325 MG tablet Take 1 tablet by mouth 3 (three) times daily as needed for severe pain.    [provider]  ipratropium (ATROVENT) 0.03 % nasal spray Place 2 sprays into both nostrils 2 (two) times daily. 08/28/17   Bing NeighborsHarris, Kimberly S, FNP  LORazepam (ATIVAN) 1 MG tablet Take 1 tablet (1 mg total) by mouth 3 (three) times daily as needed for anxiety. 09/03/17   Orly Quimby C, PA-C  promethazine-codeine (PHENERGAN WITH CODEINE) 6.25-10 MG/5ML syrup Take 5 mLs by mouth every 6 (six) hours as needed for cough. 08/28/17   Bing NeighborsHarris, Kimberly S, FNP    Family History Family History  Problem Relation Age of Onset  . Liver disease Mother   . Dementia Mother   . Cancer Mother   . Cancer Maternal Grandmother     Social History Social History   Tobacco Use  . Smoking status: Never Smoker  . Smokeless tobacco: Never Used  Substance Use Topics  . Alcohol use: No    Alcohol/week: 0.0 oz    Comment: 04/11/2016 "I'll have a glass of wine q 2-3 months"  . Drug use: No     Allergies   Zofran [ondansetron hcl]; Droperidol; Flexeril [cyclobenzaprine]; Morphine and related; and Tessalon [benzonatate]   Review of Systems Review of Systems  Respiratory: Negative for shortness of breath.   Cardiovascular: Negative for chest pain.  Gastrointestinal: Negative for abdominal pain, diarrhea, nausea and vomiting.  Psychiatric/Behavioral: Positive for sleep disturbance. Negative for hallucinations, self-injury and suicidal ideas. The patient is nervous/anxious.      Physical Exam Updated Vital Signs BP 100/71   Pulse 96   Temp 98 F (36.7 C) (Oral)   SpO2 100%   Physical Exam  Constitutional: She appears well-developed and well-nourished. No distress.  HENT:  Head: Normocephalic and atraumatic.  Eyes: Conjunctivae are normal.  Neck: Neck supple.  Cardiovascular: Normal rate and regular rhythm.    Pulmonary/Chest: Effort normal.  Neurological: She is alert.  Skin: Skin is warm and dry. She is not diaphoretic. No pallor.  Psychiatric: She has a normal mood and affect. Her behavior is normal.  Nursing note and vitals reviewed.    ED Treatments / Results  Labs (all labs ordered are listed, but only abnormal results are displayed) Labs Reviewed - No data to display  EKG  EKG Interpretation None       Radiology No results found.  Procedures Procedures (including critical care time)  Medications Ordered in ED Medications - No data to display   Initial Impression / Assessment and Plan / ED Course  I have reviewed the triage vital signs and the nursing notes.  Pertinent labs & imaging results that were available during my care of the patient were reviewed by me and considered in  my medical decision making (see chart for details).     Patient presents with acute worsening of her anxiety due to a significant illness with her father.  She has no current prescription of benzodiazepine, verified on the [place] narcotic database. She will be prescribed a very small amount of ativan for her acute onset of anxiety and panic attacks in the setting of an acute stressful event. She is aware she needs regular management by a mental health professional for her anxiety.  Resources were given and discussed.  Return precautions also discussed.  Patient voices understanding of all instructions and is comfortable discharge.  Findings and plan of care discussed with Theda Belfast, MD.   Final Clinical Impressions(s) / ED Diagnoses   Final diagnoses:  Anxiety    ED Discharge Orders        Ordered    LORazepam (ATIVAN) 1 MG tablet  3 times daily PRN     09/03/17 1732       Anselm Pancoast, PA-C 09/03/17 1747    Tegeler, Canary Brim, MD 09/03/17 1754

## 2017-09-04 ENCOUNTER — Other Ambulatory Visit: Payer: Self-pay

## 2017-09-04 ENCOUNTER — Encounter (HOSPITAL_BASED_OUTPATIENT_CLINIC_OR_DEPARTMENT_OTHER): Payer: Self-pay | Admitting: Adult Health

## 2017-09-04 ENCOUNTER — Emergency Department (HOSPITAL_BASED_OUTPATIENT_CLINIC_OR_DEPARTMENT_OTHER)
Admission: EM | Admit: 2017-09-04 | Discharge: 2017-09-04 | Disposition: A | Discharge status: Home / Self Care | Payer: Self-pay | Attending: Emergency Medicine | Admitting: Emergency Medicine

## 2017-09-04 DIAGNOSIS — F419 Anxiety disorder, unspecified: Secondary | ICD-10-CM | POA: Insufficient documentation

## 2017-09-04 DIAGNOSIS — Z79899 Other long term (current) drug therapy: Secondary | ICD-10-CM | POA: Insufficient documentation

## 2017-09-04 NOTE — ED Notes (Addendum)
Sitting calming in waiting room, texting on phone, no distress noted

## 2017-09-04 NOTE — ED Notes (Signed)
ED Provider at bedside. 

## 2017-09-04 NOTE — ED Provider Notes (Signed)
[facility] EMERGENCY DEPARTMENT Provider Note   CSN: [id number] Arrival date & time: 09/04/17  1531     History   Chief Complaint Chief Complaint  Patient presents with  . Panic Attack    HPI Sabrina [name] is a 51 y.o. female with a past medical history of anxiety, who presents to the ED for evaluation of worsening of panic attacks over the past 5 days.  She states that she is concerned because she does not know when her panic attacks are going to come on.  She reports she has been having decreased sleep over the past few days and is unsure if this sleep is causing her anxiety or vice versa.  She has tried several medications in the past including Ativan, hydroxyzine but she states that these do not help.  She states she took alprazolam a few years ago and states that that did help her with her anxiety.  When her panic attacks occur, she states she has to curl up and starts shaking.  She denies any SI, HI, auditory or visual hallucinations, chest pain, trouble breathing or other symptoms at this time. Of note, patient was seen and evaluated in the emergency room yesterday.  She was given 5 tablets of Ativan.  States that she took 1-1/2 doses of this when she felt like her panic attack was coming on but states that this has not helped her.  HPI  Past Medical History:  Diagnosis Date  . Arthritis   . Bullous pemphigus   . Cat bite 06/2014   to left elbow  . History of blood transfusion 1988   "when I had my baby"  . 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 Poisoning, snake bite 04/08/2016   "[place]; RUE"  . PONV (postoperative nausea and vomiting)   . Situational anxiety   . Staph infection     Patient Active Problem List   Diagnosis Date Noted  . Right hand pain   . Wrist swelling   . Paresthesia   . Abscess of finger   . Cellulitis of right upper extremity 04/11/2016  . Cellulitis 04/11/2016  . Snake bite   . Medical non-compliance 10/05/2015  . Elbow pain  07/20/2015  . Anxiety state 07/20/2015  . Wound infection 07/04/2015  . Dehydration 07/04/2015  . Tachycardia 07/04/2015  . Diarrhea 07/04/2015  . Wound infection after surgery 07/04/2015  . 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HCC)   . Cat bite of forearm 07/01/2015  . Skin ulcer of upper arm, limited to breakdown of skin (HCC) 07/01/2015  . Cellulitis of upper arm 03/10/2015    Past Surgical History:  Procedure Laterality Date  . APPENDECTOMY  ~ 1987  . APPLICATION OF A-CELL OF EXTREMITY Left 08/05/2015   Procedure: APPLICATION OF A-CELL OF EXTREMITY;  Surgeon: Peggye Formlaire S Dillingham, DO;  Location: [facility];  Service: Plastics;  Laterality: Left;  . BREAST SURGERY Right 1990   "mild duct taken out"  . DEBRIDEMENT AND CLOSURE WOUND Left 07/01/2015   Procedure: LEFT ELBOW EXCISION OF WOUND WITH PRIMARY CLOSURE 2X5 CM ;  Surgeon: Peggye Formlaire S Dillingham, DO;  Location:  [facility];  Service: Plastics;  Laterality: Left;  . ELBOW SURGERY  X 23 in [place] <06/2015   from a cat bite; all I&D  . I&D EXTREMITY Left 07/08/2015   Procedure: IRRIGATION AND DEBRIDEMENT EXTREMITY, DRAINAGE OF LEFT ARM WOUND, A-CELL PLACEMENT, WOUND VAC PLACEMENT;  Surgeon: Alena Billslaire S Dillingham, DO;  Location: [facility];  Service: Government social research officer;  Laterality: Left;  . INCISION AND DRAINAGE OF WOUND Left 08/05/2015   Procedure: IRRIGATION AND DEBRIDEMENT LEFT ELBOW WOUND, PLACEMENT OF ACELL;  Surgeon: Peggye Form, DO;  Location:  [facility]UR[facility];  Service: Plastics;  Laterality: Left;  . LAPAROSCOPIC CHOLECYSTECTOMY  1998  . SKIN GRAFT Left 2016   took from anterior thigh; placed at elbow  . TONSILLECTOMY  ~ 2000  . TOTAL ABDOMINAL HYSTERECTOMY  2003    OB History    No data available       Home Medications    Prior to Admission medications   Medication Sig Start Date End Date Taking? Authorizing Provider  gabapentin (NEURONTIN) 300 MG capsule Take 2 capsules (600 mg total) by mouth 3  (three) times daily. 06/12/17  Yes Bing Neighbors, FNP  HYDROcodone-acetaminophen (NORCO) 10-325 MG tablet Take 1 tablet by mouth 3 (three) times daily as needed for severe pain.   Yes [provider]  clonazePAM (KLONOPIN) 0.5 MG tablet Take 1 tablet (0.5 mg total) 2 (two) times daily by mouth. Patient not taking: Reported on 08/28/2017 07/19/17   Bing Neighbors, FNP  ipratropium (ATROVENT) 0.03 % nasal spray Place 2 sprays into both nostrils 2 (two) times daily. 08/28/17   Bing Neighbors, FNP  LORazepam (ATIVAN) 1 MG tablet Take 1 tablet (1 mg total) by mouth 3 (three) times daily as needed for anxiety. 09/03/17   Joy, Shawn C, PA-C  promethazine-codeine (PHENERGAN WITH CODEINE) 6.25-10 MG/5ML syrup Take 5 mLs by mouth every 6 (six) hours as needed for cough. 08/28/17   Bing Neighbors, FNP    Family History Family History  Problem Relation Age of Onset  . Liver disease Mother   . Dementia Mother   . Cancer Mother   . Cancer Maternal Grandmother     Social History Social History   Tobacco Use  . Smoking status: Never Smoker  . Smokeless tobacco: Never Used  Substance Use Topics  . Alcohol use: No    Alcohol/week: 0.0 oz    Comment: 04/11/2016 "I'll have a glass of wine q 2-3 months"  . Drug use: No     Allergies   Zofran [ondansetron hcl]; Droperidol; Flexeril [cyclobenzaprine]; Morphine and related; and Tessalon [benzonatate]   Review of Systems Review of Systems  Constitutional: Negative for appetite change, chills and fever.  Eyes: Negative for photophobia and visual disturbance.  Respiratory: Negative for cough, chest tightness, shortness of breath and wheezing.   Cardiovascular: Negative for chest pain and palpitations.  Gastrointestinal: Negative for nausea and vomiting.  Genitourinary: Negative for dysuria, hematuria and urgency.  Skin: Negative for rash.  Neurological: Negative for dizziness, weakness, light-headedness and headaches.    Psychiatric/Behavioral: Positive for sleep disturbance. The patient is nervous/anxious.      Physical Exam Updated Vital Signs BP 118/74   Pulse (!) 108   Temp 98.1 F (36.7 C) (Oral)   Resp 18   SpO2 100%   Physical Exam  Constitutional: She appears well-developed and well-nourished. No distress.  Nontoxic appearing and in no acute distress.  Does appear very anxious and tearful.  HENT:  Head: Normocephalic and atraumatic.  Eyes: Conjunctivae and EOM are normal. No scleral icterus.  Neck: Normal range of motion.  Pulmonary/Chest: Effort normal. No respiratory distress.  Neurological: She is alert.  Skin: No rash noted. She is not diaphoretic.  Psychiatric: She has a normal mood and affect.  Nursing note and vitals reviewed.  ED Treatments / Results  Labs (all labs ordered are listed, but only abnormal results are displayed) Labs Reviewed - No data to display  EKG  EKG Interpretation None       Radiology No results found.  Procedures Procedures (including critical care time)  Medications Ordered in ED Medications - No data to display   Initial Impression / Assessment and Plan / ED Course  I have reviewed the triage vital signs and the nursing notes.  Pertinent labs & imaging results that were available during my care of the patient were reviewed by me and considered in my medical decision making (see chart for details).     Patient presents to ED for evaluation of worsening anxiety over the past several days.  States that in the past she has tried Ativan, hydroxyzine, Klonopin with no relief in her symptoms.  She states that in the past alprazolam has worked best for her.  She is in the process of getting a new psychiatrist/therapist to help with her medications.  She was seen here yesterday and was prescribed 5 Ativan.  She requests alprazolam at this time.  I informed her that we do not prescribe these types of medications here in the ED due to lack of  follow-up with her patients.  She denies any SI, HI, auditory or visual hallucinations.  States that she does not want to harm herself or anyone else but that she is just feeling more anxiety due to being around her family during the holidays.  She was given resources for follow-up at her visit yesterday.  I encouraged her to follow-up with a specialist for further management and evaluation of her symptoms.  I did give her any strict return precautions for severe or worsening symptoms as well as chest pain or trouble breathing or thoughts of wanting to harm herself or others.  Patient understands this and states that she will return for any severe symptoms.  Patient appears stable for discharge at this time.  Strict return precautions given.  Patient discussed with Dr. Jacqulyn BathLong.  Final Clinical Impressions(s) / ED Diagnoses   Final diagnoses:  Anxiety    ED Discharge Orders    None     Portions of this note were generated with Dragon dictation software. Dictation errors may occur despite best attempts at proofreading.    Dietrich PatesKhatri, Kamaryn Grimley, PA-C 09/04/17 1804    Maia PlanLong, Joshua G, MD 09/05/17 1212

## 2017-09-04 NOTE — Discharge Instructions (Signed)
Continue your home medications as previously prescribed. Follow-up with your primary care provider for further evaluation. Return to ED for worsening symptoms, chest pain, trouble breathing, thoughts of wanting to harm yourself or others.

## 2017-09-04 NOTE — ED Triage Notes (Signed)
Presents with anxiety, "I feel like a prisoner at home, my anxiety is so bad. The past three days I have had anxiety attacks where it feels like I can't breath and I hyperventilate. I went [facility] last night and given 5 ativan. I just feel like they didn't listen to me. I don't have insurance and trying to save my money to get to a psychiatrist to start therapy. I was given Klonopin over the summer that did not help and I was also given Ativan without help. The Ativan is not helping. I had an attack 2 hours ago and I took the ativan and it did not help. I haven't slept in 4 nights. I only have one ATivan left out of the 5 and I know there is nothing you all can really do. I just don't want my family to see me like that. I know what works for me, Alprazolam.

## 2017-09-05 ENCOUNTER — Encounter (HOSPITAL_COMMUNITY): Payer: Self-pay | Admitting: Emergency Medicine

## 2017-09-05 ENCOUNTER — Inpatient Hospital Stay (HOSPITAL_COMMUNITY)
Admission: EM | Admit: 2017-09-05 | Discharge: 2017-09-10 | DRG: 880 | Disposition: A | Discharge status: Home / Self Care | Payer: Self-pay | Attending: Internal Medicine | Admitting: Internal Medicine

## 2017-09-05 ENCOUNTER — Other Ambulatory Visit: Payer: Self-pay

## 2017-09-05 ENCOUNTER — Emergency Department (HOSPITAL_COMMUNITY)
Admission: EM | Admit: 2017-09-05 | Discharge: 2017-09-05 | Disposition: A | Discharge status: Home / Self Care | Payer: Self-pay | Attending: Emergency Medicine | Admitting: Emergency Medicine

## 2017-09-05 DIAGNOSIS — R200 Anesthesia of skin: Secondary | ICD-10-CM

## 2017-09-05 DIAGNOSIS — G839 Paralytic syndrome, unspecified: Secondary | ICD-10-CM

## 2017-09-05 DIAGNOSIS — Z90710 Acquired absence of both cervix and uterus: Secondary | ICD-10-CM

## 2017-09-05 DIAGNOSIS — F329 Major depressive disorder, single episode, unspecified: Secondary | ICD-10-CM | POA: Diagnosis present

## 2017-09-05 DIAGNOSIS — F419 Anxiety disorder, unspecified: Secondary | ICD-10-CM | POA: Insufficient documentation

## 2017-09-05 DIAGNOSIS — R531 Weakness: Secondary | ICD-10-CM

## 2017-09-05 DIAGNOSIS — N390 Urinary tract infection, site not specified: Secondary | ICD-10-CM | POA: Diagnosis present

## 2017-09-05 DIAGNOSIS — M545 Low back pain: Secondary | ICD-10-CM

## 2017-09-05 DIAGNOSIS — L120 Bullous pemphigoid: Secondary | ICD-10-CM

## 2017-09-05 DIAGNOSIS — M549 Dorsalgia, unspecified: Secondary | ICD-10-CM

## 2017-09-05 DIAGNOSIS — F32A Depression, unspecified: Secondary | ICD-10-CM

## 2017-09-05 DIAGNOSIS — R202 Paresthesia of skin: Secondary | ICD-10-CM

## 2017-09-05 DIAGNOSIS — F411 Generalized anxiety disorder: Principal | ICD-10-CM | POA: Diagnosis present

## 2017-09-05 DIAGNOSIS — R21 Rash and other nonspecific skin eruption: Secondary | ICD-10-CM | POA: Insufficient documentation

## 2017-09-05 DIAGNOSIS — Z9049 Acquired absence of other specified parts of digestive tract: Secondary | ICD-10-CM

## 2017-09-05 DIAGNOSIS — F410 Panic disorder [episodic paroxysmal anxiety] without agoraphobia: Secondary | ICD-10-CM

## 2017-09-05 DIAGNOSIS — R29898 Other symptoms and signs involving the musculoskeletal system: Secondary | ICD-10-CM

## 2017-09-05 DIAGNOSIS — Z79899 Other long term (current) drug therapy: Secondary | ICD-10-CM | POA: Insufficient documentation

## 2017-09-05 DIAGNOSIS — R1110 Vomiting, unspecified: Secondary | ICD-10-CM | POA: Insufficient documentation

## 2017-09-05 HISTORY — DX: Muscle weakness (generalized): M62.81

## 2017-09-05 LAB — I-STAT CHEM 8, ED
BUN: 23 mg/dL — ABNORMAL HIGH (ref 6–20)
CALCIUM ION: 1.13 mmol/L — AB (ref 1.15–1.40)
CREATININE: 1 mg/dL (ref 0.44–1.00)
Chloride: 108 mmol/L (ref 101–111)
Glucose, Bld: 84 mg/dL (ref 65–99)
HEMATOCRIT: 37 % (ref 36.0–46.0)
HEMOGLOBIN: 12.6 g/dL (ref 12.0–15.0)
Potassium: 4.1 mmol/L (ref 3.5–5.1)
Sodium: 142 mmol/L (ref 135–145)
TCO2: 25 mmol/L (ref 22–32)

## 2017-09-05 MED ORDER — PROMETHAZINE HCL 25 MG/ML IJ SOLN
12.5000 mg | Freq: Once | INTRAMUSCULAR | Status: AC
  Administered 2017-09-06: 12.5 mg via INTRAVENOUS
  Filled 2017-09-05: qty 1

## 2017-09-05 MED ORDER — PROMETHAZINE HCL 25 MG PO TABS
25.0000 mg | ORAL_TABLET | Freq: Once | ORAL | Status: AC
  Administered 2017-09-05: 25 mg via ORAL
  Filled 2017-09-05: qty 1

## 2017-09-05 MED ORDER — FENTANYL CITRATE (PF) 100 MCG/2ML IJ SOLN: 50.0000 ug | Freq: Once | INTRAMUSCULAR | Status: DC

## 2017-09-05 MED ORDER — HYDROXYZINE HCL 25 MG PO TABS: 25.0000 mg | ORAL_TABLET | Freq: Four times a day (QID) | ORAL | 0 refills | Status: DC | PRN

## 2017-09-05 MED ORDER — HYDROMORPHONE HCL 1 MG/ML IJ SOLN
1.0000 mg | Freq: Once | INTRAMUSCULAR | Status: AC
  Administered 2017-09-06: 1 mg via INTRAVENOUS
  Filled 2017-09-05: qty 1

## 2017-09-05 NOTE — ED Notes (Signed)
Pt is vomiting, stated she vomited on her blanket, 2 more blankets given as well as a vomiting bag given to pt. RN to be made aware of pt vomiting.

## 2017-09-05 NOTE — ED Triage Notes (Signed)
Pt in from home, c/o generalized rash since last night. Very anxious, c/o nausea. Was seen few days [facility]. Has been taking ativan, states not helping.

## 2017-09-05 NOTE — ED Notes (Addendum)
Patient reports "when I first got here I could feel my legs a little and now I can't at all." Patient denies injury.
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
## 2017-09-05 NOTE — ED Triage Notes (Signed)
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 C/o blister at tail bone causing pain in legs with tingling down legs. Reports taking gabapentin today.

## 2017-09-05 NOTE — Discharge Instructions (Signed)
Take the medication as prescribed as needed for anxiety or nausea.  See your primary care physician or return if unable to hold down fluids without vomiting or if concern for any reason

## 2017-09-05 NOTE — ED Notes (Signed)
Pt states she has a tingling sensation in her legs and is unable to feel her toes. Pt states she has this feeling before when she was paralyzed.

## 2017-09-06 ENCOUNTER — Other Ambulatory Visit: Payer: Self-pay

## 2017-09-06 ENCOUNTER — Encounter (HOSPITAL_COMMUNITY): Payer: Self-pay | Admitting: General Practice

## 2017-09-06 ENCOUNTER — Emergency Department (HOSPITAL_COMMUNITY): Payer: Self-pay

## 2017-09-06 ENCOUNTER — Inpatient Hospital Stay (HOSPITAL_COMMUNITY): Payer: Self-pay

## 2017-09-06 DIAGNOSIS — F410 Panic disorder [episodic paroxysmal anxiety] without agoraphobia: Secondary | ICD-10-CM

## 2017-09-06 DIAGNOSIS — L120 Bullous pemphigoid: Secondary | ICD-10-CM

## 2017-09-06 DIAGNOSIS — F32A Depression, unspecified: Secondary | ICD-10-CM

## 2017-09-06 DIAGNOSIS — R202 Paresthesia of skin: Secondary | ICD-10-CM

## 2017-09-06 DIAGNOSIS — F419 Anxiety disorder, unspecified: Secondary | ICD-10-CM

## 2017-09-06 DIAGNOSIS — F411 Generalized anxiety disorder: Secondary | ICD-10-CM

## 2017-09-06 DIAGNOSIS — Z810 Family history of intellectual disabilities: Secondary | ICD-10-CM

## 2017-09-06 DIAGNOSIS — R200 Anesthesia of skin: Secondary | ICD-10-CM

## 2017-09-06 DIAGNOSIS — F329 Major depressive disorder, single episode, unspecified: Secondary | ICD-10-CM

## 2017-09-06 DIAGNOSIS — R531 Weakness: Secondary | ICD-10-CM

## 2017-09-06 DIAGNOSIS — R29898 Other symptoms and signs involving the musculoskeletal system: Secondary | ICD-10-CM

## 2017-09-06 LAB — CBC WITH DIFFERENTIAL/PLATELET
BASOS ABS: 0 10*3/uL (ref 0.0–0.1)
BASOS PCT: 0 %
Eosinophils Absolute: 0.1 10*3/uL (ref 0.0–0.7)
Eosinophils Relative: 2 %
HEMATOCRIT: 35.3 % — AB (ref 36.0–46.0)
HEMOGLOBIN: 11.7 g/dL — AB (ref 12.0–15.0)
Lymphocytes Relative: 40 %
Lymphs Abs: 2.3 10*3/uL (ref 0.7–4.0)
MCH: 32 pg (ref 26.0–34.0)
MCHC: 33.1 g/dL (ref 30.0–36.0)
MCV: 96.4 fL (ref 78.0–100.0)
Monocytes Absolute: 0.6 10*3/uL (ref 0.1–1.0)
Monocytes Relative: 11 %
NEUTROS ABS: 2.7 10*3/uL (ref 1.7–7.7)
Neutrophils Relative %: 47 %
Platelets: 253 10*3/uL (ref 150–400)
RBC: 3.66 MIL/uL — ABNORMAL LOW (ref 3.87–5.11)
RDW: 13.7 % (ref 11.5–15.5)
WBC: 5.7 10*3/uL (ref 4.0–10.5)

## 2017-09-06 LAB — URINALYSIS, ROUTINE W REFLEX MICROSCOPIC
BILIRUBIN URINE: NEGATIVE
GLUCOSE, UA: NEGATIVE mg/dL
HGB URINE DIPSTICK: NEGATIVE
Ketones, ur: NEGATIVE mg/dL
NITRITE: NEGATIVE
PH: 5 (ref 5.0–8.0)
Protein, ur: NEGATIVE mg/dL
SPECIFIC GRAVITY, URINE: 1.034 — AB (ref 1.005–1.030)

## 2017-09-06 LAB — COMPREHENSIVE METABOLIC PANEL
ALBUMIN: 3.9 g/dL (ref 3.5–5.0)
ALK PHOS: 90 U/L (ref 38–126)
ALT: 17 U/L (ref 14–54)
AST: 22 U/L (ref 15–41)
Anion gap: 6 (ref 5–15)
BILIRUBIN TOTAL: 0.4 mg/dL (ref 0.3–1.2)
BUN: 28 mg/dL — AB (ref 6–20)
CALCIUM: 8.9 mg/dL (ref 8.9–10.3)
CO2: 24 mmol/L (ref 22–32)
CREATININE: 0.91 mg/dL (ref 0.44–1.00)
Chloride: 109 mmol/L (ref 101–111)
GFR calc Af Amer: >60 mL/min (ref >60)
GFR calc non Af Amer: >60 mL/min (ref >60)
GLUCOSE: 102 mg/dL — AB (ref 65–99)
Potassium: 3.7 mmol/L (ref 3.5–5.1)
Sodium: 139 mmol/L (ref 135–145)
TOTAL PROTEIN: 6.8 g/dL (ref 6.5–8.1)

## 2017-09-06 LAB — PROTIME-INR
INR: 0.86
Prothrombin Time: 11.7 seconds (ref 11.4–15.2)

## 2017-09-06 LAB — I-STAT CG4 LACTIC ACID, ED: Lactic Acid, Venous: 1.55 mmol/L (ref 0.5–1.9)

## 2017-09-06 MED ORDER — METOCLOPRAMIDE HCL 5 MG/ML IJ SOLN
10.0000 mg | Freq: Three times a day (TID) | INTRAMUSCULAR | Status: DC | PRN
  Administered 2017-09-06 – 2017-09-07 (×3): 10 mg via INTRAVENOUS
  Filled 2017-09-06 (×3): qty 2

## 2017-09-06 MED ORDER — POLYETHYLENE GLYCOL 3350 17 G PO PACK: 17.0000 g | PACK | Freq: Every day | ORAL | Status: DC | PRN

## 2017-09-06 MED ORDER — KETOROLAC TROMETHAMINE 30 MG/ML IJ SOLN
30.0000 mg | Freq: Once | INTRAMUSCULAR | Status: AC
  Administered 2017-09-06: 30 mg via INTRAVENOUS
  Filled 2017-09-06: qty 1

## 2017-09-06 MED ORDER — GADOBENATE DIMEGLUMINE 529 MG/ML IV SOLN
15.0000 mL | Freq: Once | INTRAVENOUS | Status: AC | PRN
Start: 2017-09-06 — End: 2017-09-06
  Administered 2017-09-06: 15 mL via INTRAVENOUS

## 2017-09-06 MED ORDER — FENTANYL CITRATE (PF) 100 MCG/2ML IJ SOLN
25.0000 ug | Freq: Once | INTRAMUSCULAR | Status: AC
  Administered 2017-09-06: 25 ug via INTRAVENOUS
  Filled 2017-09-06: qty 2

## 2017-09-06 MED ORDER — ACETAMINOPHEN 325 MG PO TABS
650.0000 mg | ORAL_TABLET | Freq: Four times a day (QID) | ORAL | Status: DC | PRN
  Administered 2017-09-06 – 2017-09-09 (×2): 650 mg via ORAL
  Filled 2017-09-06 (×3): qty 2

## 2017-09-06 MED ORDER — METOCLOPRAMIDE HCL 5 MG/ML IJ SOLN
10.0000 mg | Freq: Once | INTRAMUSCULAR | Status: AC
  Administered 2017-09-06: 10 mg via INTRAVENOUS
  Filled 2017-09-06: qty 2

## 2017-09-06 MED ORDER — ACETAMINOPHEN 650 MG RE SUPP: 650.0000 mg | Freq: Four times a day (QID) | RECTAL | Status: DC | PRN

## 2017-09-06 MED ORDER — ESCITALOPRAM OXALATE 10 MG PO TABS
5.0000 mg | ORAL_TABLET | Freq: Every day | ORAL | Status: DC
  Administered 2017-09-06 – 2017-09-10 (×5): 5 mg via ORAL
  Filled 2017-09-06 (×5): qty 1

## 2017-09-06 MED ORDER — GABAPENTIN 300 MG PO CAPS
600.0000 mg | ORAL_CAPSULE | Freq: Three times a day (TID) | ORAL | Status: DC
  Administered 2017-09-06 – 2017-09-10 (×13): 600 mg via ORAL
  Filled 2017-09-06 (×14): qty 2

## 2017-09-06 MED ORDER — HYDROMORPHONE HCL 1 MG/ML IJ SOLN
1.0000 mg | Freq: Once | INTRAMUSCULAR | Status: AC
  Administered 2017-09-06: 1 mg via INTRAVENOUS
  Filled 2017-09-06: qty 1

## 2017-09-06 MED ORDER — ENOXAPARIN SODIUM 40 MG/0.4ML ~~LOC~~ SOLN
40.0000 mg | SUBCUTANEOUS | Status: DC
  Administered 2017-09-06 – 2017-09-09 (×4): 40 mg via SUBCUTANEOUS
  Filled 2017-09-06 (×4): qty 0.4

## 2017-09-06 MED ORDER — HYDROCODONE-ACETAMINOPHEN 5-325 MG PO TABS
2.0000 | ORAL_TABLET | Freq: Once | ORAL | Status: AC
  Administered 2017-09-06: 2 via ORAL
  Filled 2017-09-06: qty 2

## 2017-09-06 NOTE — ED Notes (Signed)
Pt arrived to [facility] with [facility] on stretcher

## 2017-09-06 NOTE — ED Provider Notes (Signed)
Patient seen in the ER after [facility] In summary, patient was sent here for MRI of lumbar spine as she can no longer move either leg She denies any incontinence No focal motor deficits in her upper extremities Multiple complaints including back pain, rash, difficulty swallowing, however we will focus mostly on her paralysis in her legs for now MRI lumbar spine with and without contrast pending    Sabrina RhineWickline, [name], MD 09/06/17 [phone]

## 2017-09-06 NOTE — ED Notes (Signed)
Pt to MRI

## 2017-09-06 NOTE — Consult Note (Signed)
[facility] Consult   Reason for Consult:  Anxiety and concern for psychogenic weakness Referring Physician:  Dr. Lonny Prude Patient Identification: Sabrina [name] MRN:  [id number] Principal Diagnosis: Generalized anxiety disorder with panic attacks Diagnosis:   Patient Active Problem List   Diagnosis Date Noted  . Weakness [R53.1] 09/06/2017  . Depression [F32.9] 09/06/2017  . Bullous pemphigoid [L12.0] 09/06/2017  . Right hand pain [M79.641]   . Wrist swelling [M25.439]   . Paresthesia [R20.2]   . Abscess of finger [L02.519]   . Cellulitis of right upper extremity [L03.113] 04/11/2016  . Cellulitis [L03.90] 04/11/2016  . Snake bite [W59.11XA]   . Medical non-compliance [Z91.19] 10/05/2015  . Elbow pain [M25.529] 07/20/2015  . Anxiety [F41.9] 07/20/2015  . Wound infection [T14.8XXA, L08.9] 07/04/2015  . Dehydration [E86.0] 07/04/2015  . Tachycardia [R00.0] 07/04/2015  . Diarrhea [R19.7] 07/04/2015  . Wound infection after surgery [T81.49XA] 07/04/2015  . 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 Cat bite of forearm [S51.859A, W55.01XA] 07/01/2015  . Skin ulcer of upper arm, limited to breakdown of skin ([place]) [L98.491] 07/01/2015  . Cellulitis of upper arm [J85.631] 03/10/2015    Total Time spent with patient: 1 hour  Subjective:   Sabrina [name] is a 51 y.o. female patient admitted with lower extremity weakness.  HPI:   Per chart review, patient has a history of bullous pemphigoid who presented to the ED with lower extremity weakness. She reported onset of symptoms around 2:30 am yesterday associated with tingling in her legs. She had a similar episode in 2001 where her weakness improved over 2 weeks. She went to the ED with complaints of rash and nausea. She was given Zofran and there was no evidence of a rash. There is a concern that her anxiety is causing her current symptoms. She also reported a history of panic attacks since childhood and having several panic  attacks over the past few days. She was recently prescribed Ativan at an urgent care facility. PMP indicates regularly filled short supply prescriptions for Norco with last refill on 12/19.  On interview, Sabrina [name] reports that she has always had anxiety. She reports worsening anxiety since June. She has been receiving worker's compensation for a job injury of her hand. She has been receiving PT. She reports that it has been difficult to be unable to complete her normal daily routine. 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 She is unable to horseback ride or visit her father in [place]. These limitations have led to worsening anxiety and panic attacks. She cannot turn her brain off. She often worries about her finances and family. Anxiety affects her ability to sleep. She reports depressed mood since her work injury. She denies SI, HI, AVH or manic symptoms. She denies problems with her appetite. She has tried several medications for anxiety that have been ineffective (Klonopin, Valium, Atarax, Buspar and Gabapentin). She also took Wellbutrin for low mood but it caused her to feel jittery. She does admit that her current presentation may be related to anxiety. She reports that in 2001 that she was bit by a brown recluse spider which caused bullous pemphigoid. She also suffered lower extremity weakness at this time. Three years ago, she reports recurrent blisters without leg weakness. She reports significant anxiety in both situations.    Past Psychiatric History: Anxiety  Risk to Self: Is patient at risk for suicide?: No Risk to Others:  None. Denies HI. Prior Inpatient Therapy:  Denies  Prior  Outpatient Therapy:  She was previously seen at [facility]. She reports a poor experience since she did not feel like her provided listened to her needs.   Past Medical History:  Past Medical History:  Diagnosis Date  . Arthritis   . Bullous pemphigus   . Cat bite 06/2014   to left elbow  . History of  blood transfusion 1988   "when I had my baby"  . 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 Poisoning, snake bite 04/08/2016   "[place]; RUE"  . PONV (postoperative nausea and vomiting)   . Situational anxiety   . Staph infection     Past Surgical History:  Procedure Laterality Date  . APPENDECTOMY  ~ 1987  . APPLICATION OF A-CELL OF EXTREMITY Left 08/05/2015   Procedure: APPLICATION OF A-CELL OF EXTREMITY;  Surgeon: Wallace Going, DO;  Location: [facility];  Service: Plastics;  Laterality: Left;  . BREAST SURGERY Right 1990   "mild duct taken out"  . DEBRIDEMENT AND CLOSURE WOUND Left 07/01/2015   Procedure: LEFT ELBOW EXCISION OF WOUND WITH PRIMARY CLOSURE 2X5 CM ;  Surgeon: Wallace Going, DO;  Location: [facility];  Service: Plastics;  Laterality: Left;  . ELBOW SURGERY  X 23 in [place] <06/2015   from a cat bite; all I&D  . I&D EXTREMITY Left 07/08/2015   Procedure: IRRIGATION AND DEBRIDEMENT EXTREMITY, DRAINAGE OF LEFT ARM WOUND, A-CELL PLACEMENT, WOUND VAC PLACEMENT;  Surgeon: Loel Lofty Dillingham, DO;  Location: [facility];  Service: Plastics;  Laterality: Left;  . INCISION AND DRAINAGE OF WOUND Left 08/05/2015   Procedure: IRRIGATION AND DEBRIDEMENT LEFT ELBOW WOUND, PLACEMENT OF ACELL;  Surgeon: Wallace Going, DO;  Location: [facility];  Service: Plastics;  Laterality: Left;  . LAPAROSCOPIC CHOLECYSTECTOMY  1998  . SKIN GRAFT Left 2016   took from anterior thigh; placed at elbow  . TONSILLECTOMY  ~ 2000  . TOTAL ABDOMINAL HYSTERECTOMY  2003   Family History:  Family History  Problem Relation Age of Onset  . Liver disease Mother   . Dementia Mother   . Cancer Mother   . Cancer Maternal Grandmother    Family Psychiatric  History: Father-depression and anxiety.  Social History:  Social History   Substance and Sexual Activity  Alcohol Use No  . Alcohol/week: 0.0 oz   Comment: 04/11/2016 "I'll have a glass of wine q  2-3 months"     Social History   Substance and Sexual Activity  Drug Use No    Social History   Socioeconomic History  . Marital status: Divorced    Spouse name: None  . Number of children: None  . Years of education: None  . Highest education level: None  Social Needs  . Financial resource strain: None  . Food insecurity - worry: None  . Food insecurity - inability: None  . Transportation needs - medical: None  . Transportation needs - non-medical: None  Occupational History  . None  Tobacco Use  . Smoking status: Never Smoker  . Smokeless tobacco: Never Used  Substance and Sexual Activity  . Alcohol use: No    Alcohol/week: 0.0 oz    Comment: 04/11/2016 "I'll have a glass of wine q 2-3 months"  . Drug use: No  . Sexual activity: Not Currently  Other Topics Concern  . None  Social History Narrative  . None   Additional Social History: She is from [place]. She lives  at home with her dog. She is single but has been married twice. 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 She has a 6 y/o daughter and a 79 month old granddaughter.  She denies alcohol, illicit substance or tobacco use.     Allergies:   Allergies  Allergen Reactions  . Zofran [Ondansetron Hcl] Hives and Other (See Comments)    Spots around iv site  . Droperidol Palpitations  . Flexeril [Cyclobenzaprine] Anxiety  . Morphine And Related Itching  . Tessalon [Benzonatate] Other (See Comments)    Watery eyes    Labs:  Results for orders placed or performed during the hospital encounter of 09/05/17 (from the past 48 hour(s))  CBC with Differential     Status: Abnormal   Collection Time: 09/06/17 12:04 AM  Result Value Ref Range   WBC 5.7 4.0 - 10.5 K/uL   RBC 3.66 (L) 3.87 - 5.11 MIL/uL   Hemoglobin 11.7 (L) 12.0 - 15.0 g/dL   HCT 35.3 (L) 36.0 - 46.0 %   MCV 96.4 78.0 - 100.0 fL   MCH 32.0 26.0 - 34.0 pg   MCHC 33.1 30.0 - 36.0 g/dL   RDW 13.7 11.5 - 15.5 %    Platelets 253 150 - 400 K/uL   Neutrophils Relative % 47 %   Neutro Abs 2.7 1.7 - 7.7 K/uL   Lymphocytes Relative 40 %   Lymphs Abs 2.3 0.7 - 4.0 K/uL   Monocytes Relative 11 %   Monocytes Absolute 0.6 0.1 - 1.0 K/uL   Eosinophils Relative 2 %   Eosinophils Absolute 0.1 0.0 - 0.7 K/uL   Basophils Relative 0 %   Basophils Absolute 0.0 0.0 - 0.1 K/uL  Comprehensive metabolic panel     Status: Abnormal   Collection Time: 09/06/17 12:04 AM  Result Value Ref Range   Sodium 139 135 - 145 mmol/L   Potassium 3.7 3.5 - 5.1 mmol/L   Chloride 109 101 - 111 mmol/L   CO2 24 22 - 32 mmol/L   Glucose, Bld 102 (H) 65 - 99 mg/dL   BUN 28 (H) 6 - 20 mg/dL   Creatinine, Ser 0.91 0.44 - 1.00 mg/dL   Calcium 8.9 8.9 - 10.3 mg/dL   Total Protein 6.8 6.5 - 8.1 g/dL   Albumin 3.9 3.5 - 5.0 g/dL   AST 22 15 - 41 U/L   ALT 17 14 - 54 U/L   Alkaline Phosphatase 90 38 - 126 U/L   Total Bilirubin 0.4 0.3 - 1.2 mg/dL   GFR calc non Af Amer >60 >60 mL/min   GFR calc Af Amer >60 >60 mL/min    Comment: (NOTE) The eGFR has been calculated using the CKD EPI equation. This calculation has not been validated in all clinical situations. eGFR's persistently <60 mL/min signify possible Chronic Kidney Disease.    Anion gap 6 5 - 15  Protime-INR     Status: None   Collection Time: 09/06/17 12:04 AM  Result Value Ref Range   Prothrombin Time 11.7 11.4 - 15.2 seconds   INR 0.86   I-Stat CG4 Lactic Acid, ED     Status: None   Collection Time: 09/06/17 12:28 AM  Result Value Ref Range   Lactic Acid, Venous 1.55 0.5 - 1.9 mmol/L    Current Facility-Administered Medications  Medication Dose Route Frequency Provider Last Rate Last Dose  . acetaminophen (TYLENOL) tablet 650 mg  650 mg Oral Q6H PRN Oretha Milch  D, MD       Or  . acetaminophen (TYLENOL) suppository 650 mg  650 mg Rectal Q6H PRN Oretha Milch D, MD      . enoxaparin (LOVENOX) injection 40 mg  40 mg Subcutaneous Q24H Nettey, Myrlene Broker D, MD      .  gabapentin (NEURONTIN) capsule 600 mg  600 mg Oral TID Oretha Milch D, MD      . polyethylene glycol (MIRALAX / GLYCOLAX) packet 17 g  17 g Oral Daily PRN Oretha Milch D, MD        Musculoskeletal: Strength & Muscle Tone: abnormal. She is unable to move her bilateral lower extremities.  Gait & Station: unable to stand Patient leans: N/A  Psychiatric Specialty Exam: Physical Exam  Nursing note and vitals reviewed. Constitutional: She is oriented to person, place, and time. She appears well-developed and well-nourished.  HENT:  Head: Normocephalic and atraumatic.  Neck: Normal range of motion.  Respiratory: Effort normal.  Musculoskeletal: Normal range of motion.  Neurological: She is alert and oriented to person, place, and time.  Skin: No rash noted.  Psychiatric: She has a normal mood and affect. Her speech is normal and behavior is normal. Judgment and thought content normal. Cognition and memory are normal.    Review of Systems  Constitutional: Negative for chills and fever.  Cardiovascular: Negative for chest pain.  Gastrointestinal: Negative for abdominal pain, constipation, diarrhea, nausea and vomiting.  Psychiatric/Behavioral: Positive for depression. Negative for hallucinations, substance abuse and suicidal ideas. The patient is nervous/anxious and has insomnia.     Blood pressure (!) 91/57, pulse 82, temperature 97.8 F (36.6 C), temperature source Oral, resp. rate 18, SpO2 100 %.There is no height or weight on file to calculate BMI.  General Appearance: Well Groomed, middle aged, Caucasian female who is wearing a hospital gown and lying in bed. NAD.   Eye Contact:  Good  Speech:  Clear and Coherent and Normal Rate  Volume:  Normal  Mood:  Anxious  Affect:  Appropriate and Full Range  Thought Process:  Goal Directed and Linear  Orientation:  Full (Time, Place, and Person)  Thought Content:  Logical  Suicidal Thoughts:  No  Homicidal Thoughts:  No  Memory:   Immediate;   Good Recent;   Good Remote;   Good  Judgement:  Good  Insight:  Good  Psychomotor Activity:  Normal  Concentration:  Concentration: Good and Attention Span: Good  Recall:  Good  Fund of Knowledge:  Fair  Language:  Good  Akathisia:  No  Handed:  Right  AIMS (if indicated):   N/A  Assets:  Communication Skills Desire for Improvement Financial Resources/Insurance Housing Social Support  ADL's:  Intact  Cognition:  WNL  Sleep:   Poor   Assessment:  Sabrina [name] is a 51 y.o. female who was admitted with lower extremity weakness in the setting of multiple stressors. Patient reports two prior episodes with motor changes and/or blisters in the setting of significant stress. Each episode resolved with time. She reports symptoms consistent with generalized anxiety disorder with panic attacks and functional neurological symptom disorder. She may benefit from SSRI use for chronic anxiety. Benzodiazepines may benefit for a short term until SSRI becomes effective but would not recommend use with concurrent opiate use.   Treatment Plan Summary: -Start Lexapro 5 mg daily for anxiety and depression. Increase to 10 mg daily after a week. -Consider treatment with low dose benzodiazepine (reports Xanax effective in past but Klonopin,  Ativan and Valium were ineffective). Would not recommend use with concurrent opiate use due to increased risk for mortality and/or respiratory depression. -Please have SW provide patient with resources for local psychiatrists and therapists.  -Psychiatry will sign off on patient at this time. Please consult psychiatry again as needed.    Disposition: No evidence of imminent risk to self or others at present.   Patient does not meet criteria for psychiatric inpatient admission.  Faythe Dingwall, DO 09/06/2017 11:35 AM

## 2017-09-06 NOTE — Consult Note (Signed)
Neurology Consultation Reason for Consult: Leg weakness Referring Physician: Bebe ShaggyWickline, D  CC: Leg Weakness  History is obtained from:Patient  HPI: Sabrina [name] is a 51 y.o. female with a history of bullous pemphigoid who presents with LE weakness. She states that around 2:30 in the morning on christmas morning, she began having  Some tingling in her legs. She went to the ER regarding rash, and complained of some nausea, and there was concern for anxiety.   This progressed to her having to crawl around when she got home from the ER. She states that now she cannot feel her legs at all.   She reports something similar happening in 2001 where her weakness improved over two weeks.   ROS: A 14 point ROS was performed and is negative except as noted in the HPI.   Past Medical History:  Diagnosis Date  . Arthritis   . Bullous pemphigus   . Cat bite 06/2014   to left elbow  . History of blood transfusion 1988   "when I had my baby"  . 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 Poisoning, snake bite 04/08/2016   "[place]; RUE"  . PONV (postoperative nausea and vomiting)   . Situational anxiety   . Staph infection      Family History  Problem Relation Age of Onset  . Liver disease Mother   . Dementia Mother   . Cancer Mother   . Cancer Maternal Grandmother      Social History:  reports that  has never smoked. she has never used smokeless tobacco. She reports that she does not drink alcohol or use drugs.   Exam: Current vital signs: BP 106/70   Pulse (!) 103   Temp 98.1 F (36.7 C) (Oral)   Resp 18   SpO2 98%  Vital signs in last 24 hours: Temp:  [98.1 F (36.7 C)-98.4 F (36.9 C)] 98.1 F (36.7 C) (12/26 0247) Pulse Rate:  [85-108] 103 (12/26 0513) Resp:  [16-18] 18 (12/26 0247) BP: (84-119)/(58-78) 106/70 (12/26 0510) SpO2:  [97 %-100 %] 98 % (12/26 0513) Weight:  [85.7 kg (189 lb)] 85.7 kg (189 lb) (12/25 1054)   Physical Exam  Constitutional: Appears well-developed  and well-nourished.  Psych: Affect appropriate to situation Eyes: No scleral injection HENT: No OP obstrucion Head: Normocephalic.  Cardiovascular: Normal rate and regular rhythm.  Respiratory: Effort normal, non-labored breathing GI: Soft.  No distension. There is no tenderness.  Skin: WDI  Neuro: Mental Status: Patient is awake, alert, oriented to person, place, month, year, and situation. Patient is able to give a clear and coherent history. No signs of aphasia or neglect Cranial Nerves: II: Visual Fields are full. Pupils are equal, round, and reactive to light.   III,IV, VI: EOMI without ptosis or diploplia.  V: Facial sensation is symmetric to temperature VII: Facial movement is symmetric.  VIII: hearing is intact to voice X: Uvula elevates symmetrically XI: Shoulder shrug is symmetric. XII: tongue is midline without atrophy or fasciculations.  Motor: Tone is normal. Bulk is normal. 5/5 strength was present in her left arm and proximally in her right, she gives poor effort distally in her right hand. She has 0/5 strength in her legs with the exception of 1/5 hip adduction/abduction.  Sensory: On checking dermatomal sensation on her back, she has intact sensation well down into the lower lumbar/high sacral area. She complains of complete numbness from her hips down however.   She states that touching her 5th finger  on her right hand feels like I am touching the 4th and vice-versa.  Deep Tendon Reflexes: 2+ and symmetric in the biceps and patellae. Absent ankle reflexes.  Plantars: Toes are downgoing bilaterally.  Cerebellar: FNF intact bilaterally  When I asked her to sit up, she was able to sit unassisted on the side of the bed, when getting her back into bed, I had the impression that she assisted some with her legs.    I have reviewed labs in epic and the results pertinent to this consultation are: CMP - unremarkable CBC - borderline hgb  I have reviewed the images  obtained:MRI - l-spine, no clear cause of deficits.   Impression: 51 yo F with rapidly progressive lower extremity weakness. The distribution is very unusual, if spinal in nature, I would expect involvement at a level, but this is not clearly present. If length dependant peripheral and involving as high as she describes, I would expect arm involvement as well at this point. The rapidity/severity of her progression without other signs would be very unusual for GBS and I would expect absent knee reflexes given how weak her legs are.   I have a high concern for possible psychogenic weakness, but would like to rule out thoracic spine issues.   Recommendations: 1) MRI T-Spine 2) If negative, could consider PT   Ritta SlotMcNeill Tyan Lasure, MD [facility] [phone]  If 7pm- 7am, please page neurology on call as listed in AMION.

## 2017-09-06 NOTE — ED Notes (Signed)
Dr. Amada JupiterKirkpatrick at bedside performing neuro assessment; pt assisted to sitting position on side of bed and states patient assisted a little lying back in bed; additional MRI ordered

## 2017-09-06 NOTE — ED Provider Notes (Signed)
Informed by radiology the patient is unable to undergo thoracic MRI spine due to the fact the patient just had IV dye load from previous MRI  Patient still with leg weakness Will admit to hospitalist   Zadie RhineWickline, Severa Jeremiah, MD 09/06/17 [phone]

## 2017-09-06 NOTE — Evaluation (Signed)
Physical Therapy Evaluation Patient Details Name: Sabrina [name] MRN: [id number] DOB: 11/08/1965 Today's Date: 09/06/2017   History of Present Illness  Sabrina [name] is a 51 y.o. female with a history of bullous pemphigoid who presents with LE weakness. She states that around 2:30 in the morning on christmas morning, she began having  Some tingling in her legs. She went to the ER regarding rash, and complained of some nausea, and there was concern for anxiety. Pt reports she cannot feel bil LEs hip to anlkles.   Clinical Impression  Pt admitted with above diagnosis. Pt currently with functional limitations due to the deficits listed below (see PT Problem List). Pt currently very limited to bed as she can only move right hip with trace extension. No other LE movement and cannot feel from hip to ankle.  Will follow and progress pt as able.   Pt will benefit from skilled PT to increase their independence and safety with mobility to allow discharge to the venue listed below.      Follow Up Recommendations SNF;Supervision/Assistance - 24 hour    Equipment Recommendations  Other (comment)(TBA)    Recommendations for Other Services       Precautions / Restrictions Precautions Precautions: Fall Restrictions Weight Bearing Restrictions: No      Mobility  Bed Mobility Overal bed mobility: Needs Assistance Bed Mobility: Rolling Rolling: Max assist         General bed mobility comments: pt needs total assist to move LEs up but able to reach for rail with UEs.    Transfers                 General transfer comment: unable due to bil Le numbness and very little active mvoement.  Test results not complete as well.   Ambulation/Gait             General Gait Details: unable  Stairs            Wheelchair Mobility    Modified Rankin (Stroke Patients Only)       Balance                                             Pertinent Vitals/Pain Pain  Assessment: 0-10 Pain Score: 8  Pain Location: back and hips  Pain Descriptors / Indicators: Aching;Grimacing;Guarding Pain Intervention(s): Limited activity within patient's tolerance;Monitored during session;Repositioned    Home Living Family/patient expects to be discharged to:: Private residence Living Arrangements: Alone Available Help at Discharge: Family;Available 24 hours/day Type of Home: House Home Access: Level entry     Home Layout: One level Home Equipment: None      Prior Function Level of Independence: Independent         Comments: works full time as a Music therapistvet tech     Hand Dominance        Extremity/Trunk Assessment   Upper Extremity Assessment Upper Extremity Assessment: Defer to OT evaluation    Lower Extremity Assessment Lower Extremity Assessment: RLE deficits/detail;LLE deficits/detail RLE Deficits / Details: numbness hip to ankle, trace hip extension movement only and 0/5 hip flexion and abduction, knee and ankle. RLE Sensation: decreased light touch LLE Deficits / Details: numbness hip to ankle, trace hip extension movement only and 0/5 hip flexion and abduction, knee and ankle.  LLE Sensation: decreased light touch       Communication  Communication: No difficulties  Cognition Arousal/Alertness: Awake/alert Behavior During Therapy: WFL for tasks assessed/performed Overall Cognitive Status: Within Functional Limits for tasks assessed                                        General Comments      Exercises General Exercises - Upper Extremity Shoulder Flexion: AROM;Both;5 reps;Supine Elbow Flexion: AROM;Both;10 reps;Supine General Exercises - Lower Extremity Ankle Circles/Pumps: PROM;Both;5 reps;Supine Heel Slides: PROM;Both;5 reps;Supine Hip ABduction/ADduction: PROM;Both;5 reps;Supine   Assessment/Plan    PT Assessment Patient needs continued PT services  PT Problem List Decreased activity tolerance;Decreased  balance;Decreased strength;Decreased range of motion;Decreased mobility;Decreased coordination;Decreased knowledge of use of DME;Decreased safety awareness;Decreased knowledge of precautions;Cardiopulmonary status limiting activity;Pain;Impaired sensation       PT Treatment Interventions DME instruction;Gait training;Functional mobility training;Therapeutic activities;Therapeutic exercise;Balance training;Patient/family education    PT Goals (Current goals can be found in the Care Plan section)  Acute Rehab PT Goals Patient Stated Goal: to find out what is going on PT Goal Formulation: With patient Time For Goal Achievement: 09/20/17 Potential to Achieve Goals: Good    Frequency Min 3X/week   Barriers to discharge        Co-evaluation               AM-PAC PT "6 Clicks" Daily Activity  Outcome Measure Difficulty turning over in bed (including adjusting bedclothes, sheets and blankets)?: Unable Difficulty moving from lying on back to sitting on the side of the bed? : Unable Difficulty sitting down on and standing up from a chair with arms (e.g., wheelchair, bedside commode, etc,.)?: Unable Help needed moving to and from a bed to chair (including a wheelchair)?: Total Help needed walking in hospital room?: Total Help needed climbing 3-5 steps with a railing? : Total 6 Click Score: 6    End of Session   Activity Tolerance: Patient limited by fatigue;Patient limited by pain Patient left: in bed;with call bell/phone within reach Nurse Communication: Mobility status;Need for lift equipment PT Visit Diagnosis: Other abnormalities of gait and mobility (R26.89);Pain Pain - part of body: Leg(back)    Time: [id number] PT Time Calculation (min) (ACUTE ONLY): 10 min   Charges:   PT Evaluation $PT Eval Moderate Complexity: 1 Mod     PT G Codes:   PT G-Codes **NOT FOR INPATIENT CLASS** Functional Assessment Tool Used: AM-PAC 6 Clicks Basic Mobility Functional Limitation:  Mobility: Walking and moving around Mobility: Walking and Moving Around Current Status (Y7829(G8978): 100 percent impaired, limited or restricted Mobility: Walking and Moving Around Goal Status (F6213(G8979): 100 percent impaired, limited or restricted    [facility] CenterDawn Mckoy Bhakta,PT Acute Rehabilitation [phone])   Berline Lopesawn F Annet Manukyan 09/06/2017, 3:14 PM

## 2017-09-06 NOTE — ED Notes (Signed)
Pt told she was getting a repeat dose of fentanyl for pain, pt looked at monitor and stated "since my blood pressure looks a little better can I get another dose of that other pain medicine that I got before at [facility]?"

## 2017-09-06 NOTE — H&P (Addendum)
History and Physical  Sabrina [name] YDX:[id number] DOB: Aug 13, 1966 DOA: 09/05/2017  Referring physician: Dr. Christy Gentles PCP: Scot Jun, FNP  Outpatient Specialists: None Patient coming from:Home Previously ambulated independently at baseline until 24 hours ago  Chief Complaint: lower leg weakness   HPI: Sabrina [name] is a 51 y.o. 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    Symptoms started yesterday patient states she noted increased tingling and numbness of both her lower legs in the morning by the afternoon.  That then progressed to total weakness.  She had to crawl around her house to move.  She does report persistent lower back pain pain.  She believes her back pain is associated with her recent flare of bullous pemphigoud she denies any recent sick contacts, fevers, chills, dysuria, abdominal pain, nausea or vomiting, cough.  She has decreased strength in her right hand due to metal bar puncturing her wrist over 6 months ago.  Otherwise no upper extremity weakness or numbness.  No recent fall or other trauma.  She reports new onset difficulty swallowing liquids in the past day.  She states prior to this presentation she had been seen in the ED a few days prior to for worsening anxiety and frequent panic attacks.  Her family nurse practitioner has been managing this issue with Klonopin however she had ran out of her medication approximately a month ago and was given some supplemental medicine in the ED.  She states she had a similar presentation of acute lower extremity paralysis in 2001 after the diagnosis of her bullous pemphigoid related to a brown recluse spider bite in [place].  At that time she reports receiving an MRI of the spine which was also normal.  Her neurologic deficits at that time resolved with treatment of her  bullous pemphigoid and a bone marrow biopsy for on clearly disease.  Patient states since then her bullous pemphigoid has been well controlled until recently.  It tends to flare during times of stress.  Her last flare was 3 years ago after the passing of her mother.  Has noticed blisters of her upper and lower back.  Patient admits increased stress for multiple reasons: She has been him her coworkers comp after sustaining trauma at her job.  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  She states it has been difficult living on less money, not doing the job that she left, and depending on others for financial help.  Additionally her father is a poor state of health issues she can visit him and her daughter during this time period.     ED Course: Transferred from [facility].  In the ED she was found to be afebrile, normal oxygen saturation, blood pressure range 84/67-95/58.  Lab workup significant for hemoglobin 11.7. Neurology was consulted given the acute onset of weakness. However based on the exam they have high concern for possible psychogenic weakness MRI lumbar spine: Mild lumbar spondylosis at L4 and L4-L5 canal stenosis.  No acute osseous abnormality  Review of Systems:As mentioned in the history of present illness.Review of systems are otherwise negative Patient seen in the ED .    Past Medical History:  Diagnosis Date  . Arthritis   . Bullous pemphigus   . Cat bite 06/2014   to left elbow  . History of blood transfusion  1988   "when I had my baby"  . 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 Poisoning, snake bite 04/08/2016   "[place]; RUE"  . PONV (postoperative nausea and vomiting)   . Situational anxiety   . Staph infection    Past Surgical History:  Procedure Laterality Date  . APPENDECTOMY  ~ 1987  . APPLICATION OF A-CELL OF EXTREMITY Left 08/05/2015   Procedure: APPLICATION OF A-CELL OF EXTREMITY;  Surgeon: Wallace Going,  DO;  Location: [facility];  Service: Plastics;  Laterality: Left;  . BREAST SURGERY Right 1990   "mild duct taken out"  . DEBRIDEMENT AND CLOSURE WOUND Left 07/01/2015   Procedure: LEFT ELBOW EXCISION OF WOUND WITH PRIMARY CLOSURE 2X5 CM ;  Surgeon: Wallace Going, DO;  Location: [facility];  Service: Plastics;  Laterality: Left;  . ELBOW SURGERY  X 23 in [place] <06/2015   from a cat bite; all I&D  . I&D EXTREMITY Left 07/08/2015   Procedure: IRRIGATION AND DEBRIDEMENT EXTREMITY, DRAINAGE OF LEFT ARM WOUND, A-CELL PLACEMENT, WOUND VAC PLACEMENT;  Surgeon: Loel Lofty Dillingham, DO;  Location: [facility];  Service: Plastics;  Laterality: Left;  . INCISION AND DRAINAGE OF WOUND Left 08/05/2015   Procedure: IRRIGATION AND DEBRIDEMENT LEFT ELBOW WOUND, PLACEMENT OF ACELL;  Surgeon: Wallace Going, DO;  Location: [facility];  Service: Plastics;  Laterality: Left;  . LAPAROSCOPIC CHOLECYSTECTOMY  1998  . SKIN GRAFT Left 2016   took from anterior thigh; placed at elbow  . TONSILLECTOMY  ~ 2000  . TOTAL ABDOMINAL HYSTERECTOMY  2003    Social History:  reports that  has never smoked. she has never used smokeless tobacco. She reports that she does not drink alcohol or use drugs.   Allergies  Allergen Reactions  . Zofran [Ondansetron Hcl] Hives and Other (See Comments)    Spots around iv site  . Droperidol Palpitations  . Flexeril [Cyclobenzaprine] Anxiety  . Morphine And Related Itching  . Tessalon [Benzonatate] Other (See Comments)    Watery eyes    Family History  Problem Relation Age of Onset  . Liver disease Mother   . Dementia Mother   . Cancer Mother   . Cancer Maternal Grandmother       Prior to Admission medications   Medication Sig Start Date End Date Taking? Authorizing Provider  gabapentin (NEURONTIN) 300 MG capsule Take 2 capsules (600 mg total) by mouth 3 (three) times daily. 06/12/17  Yes Scot Jun, FNP     Physical Exam: BP (!) 91/57 (BP Location: Right Arm)   Pulse 82   Temp 97.8 F (36.6 C) (Oral)   Resp 18   SpO2 100%   General: Lying in bed in Appear in no distress Eyes: EOMI, anicteric ENT: Oral Mucosa clear moist. Neck: normal, supple, no masses, no JVD Cardiovascular: regular rate and rhythm, Murmurs, rubs or gallops. Peripheral Pulses present. No edema Respiratory: Normal respiratory effort, clear breath sounds on auscultation bilaterally, no rales wheezes or rhonchi Abdomen: soft, non-distended, non-tender, normal bowel sounds, no guarding or rebound tenderness Skin: Scattered tense bullae: Located on upper back, lumbar region.  Unroofed blister on the anterior chest.  Numerous hyperpigmented lesions consistent with scarring consistent with prior flares of bullous pemphigus  Musculoskeletal:, *No clubbing / cyanosis. No joint deformity upper and lower extremities. Good ROM, no contractures. Normal muscle tone Neurologic: not moving lower extremities. Feels sensation from midthigh to proximal body. Full strength in upper  extremities. Alert and oriented x3 Psychiatric:flat affect, normal mood          Labs on Admission:  Basic Metabolic Panel: Recent Labs  Lab 09/05/17 1115 09/06/17 0004  NA 142 139  K 4.1 3.7  CL 108 109  CO2  --  24  GLUCOSE 84 102*  BUN 23* 28*  CREATININE 1.00 0.91  CALCIUM  --  8.9   Liver Function Tests: Recent Labs  Lab 09/06/17 0004  AST 22  ALT 17  ALKPHOS 90  BILITOT 0.4  PROT 6.8  ALBUMIN 3.9   No results for input(s): LIPASE, AMYLASE in the last 168 hours. No results for input(s): AMMONIA in the last 168 hours. CBC: Recent Labs  Lab 09/05/17 1115 09/06/17 0004  WBC  --  5.7  NEUTROABS  --  2.7  HGB 12.6 11.7*  HCT 37.0 35.3*  MCV  --  96.4  PLT  --  253   Cardiac Enzymes: No results for input(s): CKTOTAL, CKMB, CKMBINDEX, TROPONINI in the last 168 hours.  BNP (last 3 results) No results for input(s): BNP in the  last 8760 hours.  ProBNP (last 3 results) No results for input(s): PROBNP in the last 8760 hours.  CBG: No results for input(s): GLUCAP in the last 168 hours.  Radiological Exams on Admission: Mr Lumbar Spine W Wo Contrast  Result Date: 09/06/2017 CLINICAL DATA:  51 y/o F; back pain with inability to move lower extremities. EXAM: MRI LUMBAR SPINE WITHOUT AND WITH CONTRAST TECHNIQUE: Multiplanar and multiecho pulse sequences of the lumbar spine were obtained without and with intravenous contrast. CONTRAST:  34m MULTIHANCE GADOBENATE DIMEGLUMINE 529 MG/ML IV SOLN COMPARISON:  None. FINDINGS: Segmentation:  Standard. Alignment:  Physiologic. Vertebrae: Right L5 vertebral body T1/T2 hyperintense focus, likely hemangioma. No fracture, evidence of discitis, or bone lesion. Conus medullaris and cauda equina: Conus extends to the L1 level. Conus and cauda equina appear normal. No abnormal enhancement. Paraspinal and other soft tissues: Negative. Disc levels: L1-2: No significant disc displacement, foraminal stenosis, or canal stenosis. L2-3: No significant disc displacement, foraminal stenosis, or canal stenosis. L3-4: Small disc bulge. No significant foraminal or canal stenosis. L4-5: Small disc bulge.  No significant foraminal or canal stenosis. L5-S1: No significant disc displacement, foraminal stenosis, or canal stenosis. IMPRESSION: 1. No acute osseous abnormality. No abnormal signal or enhancement of the visible conus medullaris or cauda equina. 2. Mild lumbar spondylosis at L3-4 and L4-5 levels. No significant foraminal or canal stenosis. Electronically Signed   By: LKristine GarbeM.D.   On: 09/06/2017 05:17     Assessment/Plan Present on Admission: **None**  Active Problems:   Anxiety   Weakness   Depression   Bullous pemphigoid   Acute lower extremity weakness.  MRI imaging of lumbar spine reassuring.  Neurology consulted and suspect some psychogenic component which I agree  with given increase stressors in patient's life currently; however, will need thoracic spine to fully rule out neurologic process.  Able to elicit knee reflexes but b/l lower extremity strength 0 out of 5 -MRI  -PT and OT -Psych consult -Follow-up neurology recommendations  Bullous pemphigoid. A few scattered bullae on back and chest otherwise stable. Tends to occur during stress per patient -Continue to monitor  Depression/Anxiety. Very likely a big factor in patient's current presentation. Patient has been on xanax and klonopin as an outpatient. I am worried she is displaying some pain seeking ways. -will follow psych's recommendations: start lexapro '5mg'$ , increase to 10 mg  after a week. No benzos as outpatient as taking norco as outpatient -judicious use of pain medications    DVT prophylaxis: Lovenox  Code Status: Full Code   Family Communication: No family was present at bedside and updated appropriately at the time of interview.   Disposition Plan: MRI T spine, psych recs   Consults called: Neurology, Psychiatry   Admission status: Admitted as observation       Desiree Hane MD [facility]  Pager [phone]  If 7PM-7AM, please contact night-coverage [email or web address] Password [facility]  09/06/2017, 11:25 AM

## 2017-09-06 NOTE — ED Notes (Signed)
Pt returned from MRI; c/o worsening pain and nausea

## 2017-09-06 NOTE — ED Provider Notes (Signed)
At signout to Dr Criss AlvineGoldston, f/u on MRI thoracic spine If negative and pt can walk, she can be discharged per neurology recommendations    Zadie RhineWickline, Janasha Barkalow, MD 09/06/17 [phone]

## 2017-09-06 NOTE — ED Provider Notes (Signed)
MRI negative, I have consulted neurology for further assistance   Zadie RhineWickline, Allora Bains, MD 09/06/17 [phone]

## 2017-09-07 ENCOUNTER — Inpatient Hospital Stay (HOSPITAL_COMMUNITY): Payer: Self-pay

## 2017-09-07 DIAGNOSIS — F410 Panic disorder [episodic paroxysmal anxiety] without agoraphobia: Secondary | ICD-10-CM

## 2017-09-07 DIAGNOSIS — G839 Paralytic syndrome, unspecified: Secondary | ICD-10-CM

## 2017-09-07 DIAGNOSIS — R531 Weakness: Secondary | ICD-10-CM

## 2017-09-07 DIAGNOSIS — F411 Generalized anxiety disorder: Principal | ICD-10-CM

## 2017-09-07 DIAGNOSIS — M545 Low back pain: Secondary | ICD-10-CM

## 2017-09-07 LAB — CBC
HCT: 32.4 % — ABNORMAL LOW (ref 36.0–46.0)
Hemoglobin: 10.7 g/dL — ABNORMAL LOW (ref 12.0–15.0)
MCH: 31.8 pg (ref 26.0–34.0)
MCHC: 33 g/dL (ref 30.0–36.0)
MCV: 96.4 fL (ref 78.0–100.0)
PLATELETS: 231 10*3/uL (ref 150–400)
RBC: 3.36 MIL/uL — AB (ref 3.87–5.11)
RDW: 13.6 % (ref 11.5–15.5)
WBC: 5 10*3/uL (ref 4.0–10.5)

## 2017-09-07 LAB — BASIC METABOLIC PANEL
Anion gap: 6 (ref 5–15)
BUN: 24 mg/dL — ABNORMAL HIGH (ref 6–20)
CHLORIDE: 109 mmol/L (ref 101–111)
CO2: 24 mmol/L (ref 22–32)
CREATININE: 0.82 mg/dL (ref 0.44–1.00)
Calcium: 8.8 mg/dL — ABNORMAL LOW (ref 8.9–10.3)
GFR calc non Af Amer: >60 mL/min (ref >60)
GLUCOSE: 85 mg/dL (ref 65–99)
Potassium: 4.5 mmol/L (ref 3.5–5.1)
Sodium: 139 mmol/L (ref 135–145)

## 2017-09-07 MED ORDER — HYDROCODONE-ACETAMINOPHEN 5-325 MG PO TABS
1.0000 | ORAL_TABLET | Freq: Four times a day (QID) | ORAL | Status: DC | PRN
  Administered 2017-09-07 (×2): 2 via ORAL
  Administered 2017-09-08: 1 via ORAL
  Administered 2017-09-08: 2 via ORAL
  Administered 2017-09-08: 1 via ORAL
  Administered 2017-09-08 – 2017-09-10 (×6): 2 via ORAL
  Filled 2017-09-07 (×9): qty 2
  Filled 2017-09-07: qty 1
  Filled 2017-09-07: qty 2

## 2017-09-07 MED ORDER — SODIUM CHLORIDE 0.9 % IV BOLUS (SEPSIS)
500.0000 mL | Freq: Once | INTRAVENOUS | Status: AC
  Administered 2017-09-07: 500 mL via INTRAVENOUS

## 2017-09-07 NOTE — NC FL2 (Signed)
[place] MEDICAID FL2 LEVEL OF CARE SCREENING TOOL     IDENTIFICATION  Patient Name: Sabrina JabsDana L Boghosian Birthdate: 1965-12-09 Sex: female Admission Date (Current Location): 09/05/2017  [facility] [place]   Facility and Address:  [facility], [address], [place], [place] [address]      Provider Number: [id number]  Attending Physician Name and Address:  Narda BondsNettey, Ralph A, MD  Relative Name and Phone Number:       Current Level of Care: Hospital Recommended Level of Care: Skilled Nursing Facility Prior Approval Number:    Date Approved/Denied:   PASRR Number:    Discharge Plan: SNF    Current Diagnoses: Patient Active Problem List   Diagnosis Date Noted  . Weakness 09/06/2017  . Depression 09/06/2017  . 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  . Generalized anxiety disorder with panic attacks 09/06/2017  . Right hand pain   . Wrist swelling   . Paresthesia   . Abscess of finger   . Cellulitis of right upper extremity 04/11/2016  . Cellulitis 04/11/2016  . Snake bite   . Medical non-compliance 10/05/2015  . Elbow pain 07/20/2015  . Anxiety 07/20/2015  . Wound infection 07/04/2015  . Dehydration 07/04/2015  . Tachycardia 07/04/2015  . Diarrhea 07/04/2015  . Wound infection after surgery 07/04/2015  . 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HCC)   . Cat bite of forearm 07/01/2015  . Skin ulcer of upper arm, limited to breakdown of skin (HCC) 07/01/2015  . Cellulitis of upper arm 03/10/2015    Orientation RESPIRATION BLADDER Height & Weight     Self, Time, Situation, Place  Normal Continent Weight:   Height:     BEHAVIORAL SYMPTOMS/MOOD NEUROLOGICAL BOWEL NUTRITION STATUS      Continent Diet(see DC summary)  AMBULATORY STATUS COMMUNICATION OF NEEDS Skin   Extensive Assist Verbally Normal                       Personal Care Assistance Level of Assistance  Bathing, Dressing Bathing Assistance: Maximum assistance   Dressing  Assistance: Maximum assistance     Functional Limitations Info             SPECIAL CARE FACTORS FREQUENCY  PT (By licensed PT), OT (By licensed OT)     PT Frequency: 5/wk OT Frequency: 5/wk            Contractures      Additional Factors Info  Code Status, Allergies Code Status Info: FULL Allergies Info: Zofran Ondansetron Hcl, Droperidol, Flexeril Cyclobenzaprine, Morphine And Related, Tessalon Benzonatate           Current Medications (09/07/2017):  This is the current hospital active medication list Current Facility-Administered Medications  Medication Dose Route Frequency Provider Last Rate Last Dose  . acetaminophen (TYLENOL) tablet 650 mg  650 mg Oral Q6H PRN Roberto ScalesNettey, Shayla D, MD   650 mg at 09/06/17 1151   Or  . acetaminophen (TYLENOL) suppository 650 mg  650 mg Rectal Q6H PRN Roberto ScalesNettey, Shayla D, MD      . enoxaparin (LOVENOX) injection 40 mg  40 mg Subcutaneous Q24H Roberto ScalesNettey, Shayla D, MD   40 mg at 09/06/17 2140  . escitalopram (LEXAPRO) tablet 5 mg  5 mg Oral Daily Roberto ScalesNettey, Shayla D, MD   5 mg at 09/07/17 0913  . gabapentin (NEURONTIN) capsule 600 mg  600 mg Oral TID Roberto ScalesNettey, Shayla D, MD   600 mg at 09/07/17 0913  . HYDROcodone-acetaminophen (NORCO/VICODIN)  5-325 MG per tablet 1-2 tablet  1-2 tablet Oral Q6H PRN Narda BondsNettey, Ralph A, MD   2 tablet at 09/07/17 1216  . metoCLOPramide (REGLAN) injection 10 mg  10 mg Intravenous Q8H PRN Schorr, Roma KayserKatherine P, NP   10 mg at 09/07/17 0914  . polyethylene glycol (MIRALAX / GLYCOLAX) packet 17 g  17 g Oral Daily PRN Laverna PeaceNettey, Shayla D, MD         Discharge Medications: Please see discharge summary for a list of discharge medications.  Relevant Imaging Results:  Relevant Lab Results:   Additional Information SS#: [id number]  Burna SisUris, Maxey Ransom H, LCSW

## 2017-09-07 NOTE — Clinical Social Work Note (Signed)
Clinical Social Work Assessment  Patient Details  Name: Sabrina JabsDana L Hursey MRN: [id number] Date of Birth: 12/31/1965  Date of referral:  09/07/17               Reason for consult:  Facility Placement                Permission sought to share information with:  Facility Industrial/product designerContact Representative Permission granted to share information::  Yes, Verbal Permission Granted  Name::        Agency::  SNF  Relationship::     Contact Information:     Housing/Transportation Living arrangements for the past 2 months:  Apartment Source of Information:  Patient Patient Interpreter Needed:  None Criminal Activity/Legal Involvement Pertinent to Current Situation/Hospitalization:  No - Comment as needed Significant Relationships:  Other(Comment)(cousins) Lives with:  Adult Children Do you feel safe going back to the place where you live?  No Need for family participation in patient care:  No (Coment)  Care giving concerns:  Pt lives alone and currently unable to walk due to medical/psych reasons.   Social Worker assessment / plan:  CSW spoke with pt concerning PT recommendation for SNF.  Pt states she lives alone but has [place] who she believes she could stay with for a while when discharged but thinks it would be difficult due to inability to walk at this time.  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  Pt states this has happened before years ago and thinks it has to do with stress and her autoammune disease.  CSW explained SNF and SNF referral process.  Pt states she would be unable to pay for SNF privately but would be interested in going if it would help her.  Employment status:  [employer](vet tech) Insurance information:  Self Pay (Medicaid Pending) PT Recommendations:  Skilled Nursing Facility Information / Referral to community resources:  Skilled Nursing  Facility  Patient/Family's Response to care:  Agreeable to SNF and understands if LOG approved would likely lead to out of county placement.  Patient/Family's Understanding of and Emotional Response to Diagnosis, Current Treatment, and Prognosis:  Unclear level of understanding- some beliefs that pt paralysis is due to mental state.  Pt reports being motivated to improve and hopeful she will be able to walk again soon.  Emotional Assessment Appearance:  Appears stated age Attitude/Demeanor/Rapport:    Affect (typically observed):  Appropriate, Pleasant Orientation:  Oriented to Self, Oriented to Place, Oriented to  Time, Oriented to Situation Alcohol / Substance use:  Not Applicable Psych involvement (Current and /or in the community):  No (Comment)  Discharge Needs  Concerns to be addressed:  Care Coordination Readmission within the last 30 days:  No Current discharge risk:  Physical Impairment Barriers to Discharge:  Continued Medical Work up   Burna SisUris, Zebbie Ace H, LCSW 09/07/2017, 3:59 PM

## 2017-09-07 NOTE — Progress Notes (Addendum)
PROGRESS NOTE    RIKIA SUKHU  ZOX:[id number] DOB: 08/12/1966 DOA: 09/05/2017 PCP: Bing Neighbors, FNP   Brief Narrative: Sabrina [name] is a 51 y.o. female with medical history significant for bullous pemphigoud, multiple bites/trauma related to veterinarian job who presents on 09/05/2017 with acute onset lower extremity weakness. Neurology consulted. MRI thoracic/lumbar.    Assessment & Plan:   Principal Problem:   Generalized anxiety disorder with panic attacks Active Problems:   Anxiety   Weakness   Depression   Bullous pemphigoid   Acute lower extremity weakness No etiology seen on MRI. Likely psychogenic. Patient thinks bullous pemphigoid is playing a role vs stress. PT recommending SNF. Psych started Lexapro -SNF -Obtain outside records  Bullous pemphigoid Ruptured bullae on back.  Back pain -lumbar spine x-ray -Continue Norco prn  Depression Anxiety Psychiatry recommending Lexapro. Social work for resources   DVT prophylaxis: Lovenox Code Status: Full code Family Communication: None at bedside Disposition Plan: Discharge to SNF   Consultants:   Neurology  Psychiatry  Procedures:   None  Antimicrobials:  None    Subjective: Back pain. Some feeling in her thigh.  Objective: Vitals:   09/06/17 1734 09/07/17 0130 09/07/17 0522 09/07/17 0957  BP: (!) 80/48 (!) 91/50 (!) 90/46 95/65  Pulse: 85 91 84 87  Resp: Temp: (!) 93.1 F (33.9 C) (!) 97.5 F (36.4 C) 98 F (36.7 C) 98.2 F (36.8 C)  TempSrc: Oral Oral Oral Oral  SpO2: 94% 96% 97% 99%    Intake/Output Summary (Last 24 hours) at 09/07/2017 1141 Last data filed at 09/06/2017 1300 Gross per 24 hour  Intake 240 ml  Output -  Net 240 ml   There were no vitals filed for this visit.  Examination:  General exam: Appears calm and comfortable Respiratory system: Clear to auscultation. Respiratory effort normal. Cardiovascular system: S1 & S2 heard, RRR. No  murmurs, rubs, gallops or clicks. Gastrointestinal system: Abdomen is nondistended, soft and nontender. No organomegaly or masses felt. Normal bowel sounds heard. Central nervous system: Alert and oriented. Patient with 1/5 LE strength and no sensation from thigh down Musculoskeletal: No edema. No calf tenderness. Tenderness over lower back that is non-specific Skin: No cyanosis. Shallow ulcers on back without erythema or drainage Psychiatry: Judgement and insight appear normal. Mood & affect appropriate.     Data Reviewed: I have personally reviewed following labs and imaging studies  CBC: Recent Labs  Lab 09/05/17 1115 09/06/17 0004 09/07/17 0315  WBC  --  5.7 5.0  NEUTROABS  --  2.7  --   HGB 12.6 11.7* 10.7*  HCT 37.0 35.3* 32.4*  MCV  --  96.4 96.4  PLT  --  253 231   Basic Metabolic Panel: Recent Labs  Lab 09/05/17 1115 09/06/17 0004 09/07/17 0315  NA 142 139 139  K 4.1 3.7 4.5  CL 108 109 109  CO2  --  24 24  GLUCOSE 84 102* 85  BUN 23* 28* 24*  CREATININE 1.00 0.91 0.82  CALCIUM  --  8.9 8.8*   GFR: Estimated Creatinine Clearance: 87.9 mL/min (by C-G formula based on SCr of 0.82 mg/dL). Liver Function Tests: Recent Labs  Lab 09/06/17 0004  AST 22  ALT 17  ALKPHOS 90  BILITOT 0.4  PROT 6.8  ALBUMIN 3.9   No results for input(s): LIPASE, AMYLASE in the last 168 hours. No results for input(s): AMMONIA in the last 168 hours. Coagulation Profile: Recent  Labs  Lab 09/06/17 0004  INR 0.86   Cardiac Enzymes: No results for input(s): CKTOTAL, CKMB, CKMBINDEX, TROPONINI in the last 168 hours. BNP (last 3 results) No results for input(s): PROBNP in the last 8760 hours. HbA1C: No results for input(s): HGBA1C in the last 72 hours. CBG: No results for input(s): GLUCAP in the last 168 hours. Lipid Profile: No results for input(s): CHOL, HDL, LDLCALC, TRIG, CHOLHDL, LDLDIRECT in the last 72 hours. Thyroid Function Tests: No results for input(s): TSH,  T4TOTAL, FREET4, T3FREE, THYROIDAB in the last 72 hours. Anemia Panel: No results for input(s): VITAMINB12, FOLATE, FERRITIN, TIBC, IRON, RETICCTPCT in the last 72 hours. Sepsis Labs: Recent Labs  Lab 09/06/17 0028  LATICACIDVEN 1.55    No results found for this or any previous visit (from the past 240 hour(s)).       Radiology Studies: Mr Thoracic Spine Wo Contrast  Result Date: 09/06/2017 CLINICAL DATA:  Hemiplegia beginning Christmas morning. History of animal bite. EXAM: MRI THORACIC SPINE WITHOUT CONTRAST TECHNIQUE: Multiplanar, multisequence MR imaging of the thoracic spine was performed. No intravenous contrast was administered. COMPARISON:  MRI of the lumbar spine with contrast September 06, 2017 at 0408 hours. CT abdomen and pelvis November 03, 2013. FINDINGS: ALIGNMENT: Maintenance of the thoracic kyphosis. No malalignment. VERTEBRAE/DISCS: Vertebral bodies are intact. Intervertebral discs morphology and signal are normal. CORD: Thoracic spinal cord is normal morphology and signal characteristics to the level of the conus medullaris which terminates at T12-L1. PREVERTEBRAL AND PARASPINAL SOFT TISSUES: Multiple T2 bright masses in the liver measuring to 15 mm similar to prior CT which classified lesions as cysts and hemangiomas. DISC LEVELS (moderately motion degraded axial T2 gradient: No disc bulge, canal stenosis nor neural foraminal narrowing. IMPRESSION: Negative motion degraded noncontrast MRI of the thoracic spine. Electronically Signed   By: Awilda Metroourtnay  Bloomer M.D.   On: 09/06/2017 22:07   Mr Lumbar Spine W Wo Contrast  Result Date: 09/06/2017 CLINICAL DATA:  51 y/o F; back pain with inability to move lower extremities. EXAM: MRI LUMBAR SPINE WITHOUT AND WITH CONTRAST TECHNIQUE: Multiplanar and multiecho pulse sequences of the lumbar spine were obtained without and with intravenous contrast. CONTRAST:  15mL MULTIHANCE GADOBENATE DIMEGLUMINE 529 MG/ML IV SOLN COMPARISON:  None.  FINDINGS: Segmentation:  Standard. Alignment:  Physiologic. Vertebrae: Right L5 vertebral body T1/T2 hyperintense focus, likely hemangioma. No fracture, evidence of discitis, or bone lesion. Conus medullaris and cauda equina: Conus extends to the L1 level. Conus and cauda equina appear normal. No abnormal enhancement. Paraspinal and other soft tissues: Negative. Disc levels: L1-2: No significant disc displacement, foraminal stenosis, or canal stenosis. L2-3: No significant disc displacement, foraminal stenosis, or canal stenosis. L3-4: Small disc bulge. No significant foraminal or canal stenosis. L4-5: Small disc bulge.  No significant foraminal or canal stenosis. L5-S1: No significant disc displacement, foraminal stenosis, or canal stenosis. IMPRESSION: 1. No acute osseous abnormality. No abnormal signal or enhancement of the visible conus medullaris or cauda equina. 2. Mild lumbar spondylosis at L3-4 and L4-5 levels. No significant foraminal or canal stenosis. Electronically Signed   By: Mitzi HansenLance  Furusawa-Stratton M.D.   On: 09/06/2017 05:17        Scheduled Meds: . enoxaparin (LOVENOX) injection  40 mg Subcutaneous Q24H  . escitalopram  5 mg Oral Daily  . gabapentin  600 mg Oral TID   Continuous Infusions:   LOS: 1 day     Jacquelin Hawkingalph Chinelo Benn, MD [facility] 09/07/2017, 11:41 AM Pager: [phone]  If  7PM-7AM, please contact night-coverage [email or web address] Password [facility] 09/07/2017, 11:41 AM

## 2017-09-07 NOTE — Evaluation (Signed)
Occupational Therapy Evaluation Patient Details Name: Sabrina JabsDana L Leopard MRN: [id number] DOB: [birth year] Today's Date: 09/07/2017    History of Present Illness Sabrina [name] is a 51 y.o. female with a history of bullous pemphigoid who presents with LE weakness. She states that around 2:30 in the morning on christmas morning, she began having  Some tingling in her legs. She went to the ER regarding rash, and complained of some nausea, and there was concern for anxiety. Pt reports she cannot feel bil LEs hip to anlkles.    Clinical Impression   Pt reports she was independent and working as a Physicist, medicalvet tech PTA. Currently pt overall max assist for LB ADL and min-mod assist +2 for sit to stand. Pt able to utilize [place] [facility] and chair. Recommending SNF for follow up to maximize independence and safety with ADL and functional mobility prior to return home. Pt would benefit from continued skilled OT to address established goals.    Follow Up Recommendations  SNF;Supervision/Assistance - 24 hour    Equipment Recommendations  Other (comment)(TBD at next venue)    Recommendations for Other Services       Precautions / Restrictions Precautions Precautions: Fall Restrictions Weight Bearing Restrictions: No      Mobility Bed Mobility Overal bed mobility: Needs Assistance Bed Mobility: Supine to Sit     Supine to sit: Mod assist     General bed mobility comments: modA for LE management to floor  Transfers Overall transfer level: Needs assistance Equipment used: Rolling walker (2 wheeled) Transfers: Sit to/from Stand Sit to Stand: Min assist;Mod assist;+2 physical assistance         General transfer comment: modA for power up to RW, pt unable to advance LE, minA for power up from bed to [place], modA [facility]    Balance Overall balance assessment: Needs assistance Sitting-balance support: Feet supported;No upper extremity supported Sitting balance-Leahy  Scale: Good     Standing balance support: Bilateral upper extremity supported Standing balance-Leahy Scale: Poor                             ADL either performed or assessed with clinical judgement   ADL Overall ADL's : Needs assistance/impaired Eating/Feeding: Set up;Sitting   Grooming: Set up;Supervision/safety;Sitting   Upper Body Bathing: Set up;Supervision/ safety;Sitting   Lower Body Bathing: Maximal assistance;Sit to/from stand   Upper Body Dressing : Supervision/safety;Set up;Sitting   Lower Body Dressing: Maximal assistance;Sit to/from Database administratorstand   Toilet Transfer: (transfer with use of Stedy to [facility])   Toileting- Clothing Manipulation and Hygiene: Minimal assistance;Sit to/from stand Toileting - Clothing Manipulation Details (indicate cue type and reason): in Stedy frame, min assist for standing balance. Pt able to perform peri care in standing.             Vision   Vision Assessment?: No apparent visual deficits     Perception     Praxis      Pertinent Vitals/Pain Pain Assessment: Faces  Faces Pain Scale: Hurts even more Pain Location: back and hips Pain Descriptors / Indicators: Aching;Sore Pain Intervention(s): Monitored during session;Repositioned     Hand Dominance     Extremity/Trunk Assessment Upper Extremity Assessment Upper Extremity Assessment: Overall WFL for tasks assessed   Lower Extremity Assessment Lower Extremity Assessment: Defer to PT evaluation    Cervical / Trunk Assessment Cervical / Trunk Assessment: Normal   Communication Communication Communication:  No difficulties   Cognition Arousal/Alertness: Awake/alert Behavior During Therapy: WFL for tasks assessed/performed Overall Cognitive Status: Within Functional Limits for tasks assessed                                     General Comments       Exercises     Shoulder Instructions      Home Living Family/patient expects to be discharged  to:: Private residence Living Arrangements: Alone Available Help at Discharge: Family;Available 24 hours/day Type of Home: House Home Access: Level entry     Home Layout: One level     Bathroom Shower/Tub: Producer, television/film/videoWalk-in shower   Bathroom Toilet: Standard     Home Equipment: None          Prior Functioning/Environment Level of Independence: Independent        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, forensicvet tech        OT Problem List: Decreased strength;Impaired balance (sitting and/or standing);Decreased knowledge of use of DME or AE;Impaired sensation;Impaired tone;Pain      OT Treatment/Interventions: Self-care/ADL training;Therapeutic exercise;Neuromuscular education;Energy conservation;DME and/or AE instruction;Therapeutic activities;Patient/family education;Balance training    OT Goals(Current goals can be found in the care plan section) Acute Rehab OT Goals Patient Stated Goal: to find out what is going on OT Goal Formulation: With patient Time For Goal Achievement: 09/21/17 Potential to Achieve Goals: Good ADL Goals Pt Will Perform Lower Body Bathing: with min assist;sit to/from stand(with or without AE) Pt Will Perform Lower Body Dressing: sit to/from stand;with min assist(with or without AE) Pt Will Transfer to Toilet: with min assist;stand pivot transfer;bedside commode Pt Will Perform Toileting - Clothing Manipulation and hygiene: sit to/from stand;with min assist  OT Frequency: Min 2X/week   Barriers to D/C: Decreased caregiver support  pt lives alone       Co-evaluation PT/OT/SLP Co-Evaluation/Treatment: Yes Reason for Co-Treatment: For patient/therapist safety;To address functional/ADL transfers PT goals addressed during session: Mobility/safety with mobility OT goals addressed during session: ADL's and self-care      AM-PAC PT "6 Clicks" Daily Activity     Outcome Measure Help from another person eating meals?: None Help from another person taking care of  personal grooming?: A Little Help from another person toileting, which includes using toliet, bedpan, or urinal?: A Lot Help from another person bathing (including washing, rinsing, drying)?: A Lot Help from another person to put on and taking off regular upper body clothing?: A Little Help from another person to put on and taking off regular lower body clothing?: A Lot 6 Click Score: 16   End of Session Equipment Utilized During Treatment: Gait belt;Rolling walker Nurse Communication: Mobility status;Need for lift equipment  Activity Tolerance: Patient tolerated treatment well Patient left: in chair;with call bell/phone within reach  OT Visit Diagnosis: Other abnormalities of gait and mobility (R26.89);Muscle weakness (generalized) (M62.81)                Time: 6962-95281030-1054 OT Time Calculation (min): 24 min Charges:  OT General Charges $OT Visit: 1 Visit OT Evaluation $OT Eval Moderate Complexity: 1 Mod G-Codes:     Sabrina [name], M.S., Sabrina [name] Pager: [phone]  Sabrina [name] 09/07/2017, 1:38 PM

## 2017-09-07 NOTE — Progress Notes (Signed)
Physical Therapy Treatment Patient Details Name: Sabrina [name] Gama MRN: [id number] DOB: 1966-08-16 Today's Date: 09/07/2017    History of Present Illness Sabrina [name] Loughmiller is a 51 y.o. female with a history of bullous pemphigoid who presents with LE weakness. She states that around 2:30 in the morning on christmas morning, she began having  Some tingling in her legs. She went to the ER regarding rash, and complained of some nausea, and there was concern for anxiety. Pt reports she cannot feel bil LEs hip to anlkles.     PT Comments    Pt is making slow progress towards her goals. Pt continues to have no sensation below upper thighs bilaterally and is unable to illicit movement or bear weight. Pt is currently modA for bed mobility, and modAx2 for transfers utilizing the [place]. D/C plans remain appropriate. PT will continue to follow acutely.   Follow Up Recommendations  SNF;Supervision/Assistance - 24 hour     Equipment Recommendations  Other (comment)(TBA)       Precautions / Restrictions Precautions Precautions: Fall Restrictions Weight Bearing Restrictions: No    Mobility  Bed Mobility Overal bed mobility: Needs Assistance Bed Mobility: Supine to Sit     Supine to sit: Mod assist     General bed mobility comments: modA for LE management to floor  Transfers Overall transfer level: Needs assistance Equipment used: Rolling walker (2 wheeled) Transfers: Sit to/from Stand Sit to Stand: Min assist;Mod assist;+2 physical assistance         General transfer comment: modA for power up to RW, pt unable to advance LE, minA for power up from bed to [place], modA for powerup from [facility] to [facility]  Ambulation/Gait             General Gait Details: unable         Balance Overall balance assessment: Needs assistance Sitting-balance support: Feet supported;No upper extremity supported Sitting balance-Leahy Scale: Fair                                       Cognition Arousal/Alertness: Awake/alert Behavior During Therapy: WFL for tasks assessed/performed Overall Cognitive Status: Within Functional Limits for tasks assessed                                               Pertinent Vitals/Pain Pain Assessment: 0-10 Pain Score: 8  Pain Location: back and hips  Pain Descriptors / Indicators: Aching;Grimacing;Guarding Pain Intervention(s): Monitored during session;Repositioned    Home Living Family/patient expects to be discharged to:: Private residence Living Arrangements: Alone Available Help at Discharge: Family;Available 24 hours/day Type of Home: House Home Access: Level entry   Home Layout: One level Home Equipment: None      Prior Function Level of Independence: Independent      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   PT Goals (current goals can now be found in the care plan section) Acute Rehab PT Goals Patient Stated Goal: to find out what is going on PT Goal Formulation: With patient Time For Goal Achievement: 09/20/17 Potential to Achieve Goals: Good Progress towards PT goals: Progressing toward goals    Frequency    Min 3X/week      PT Plan Current plan remains appropriate    Co-evaluation PT/OT/SLP Co-Evaluation/Treatment:  Yes Reason for Co-Treatment: For patient/therapist safety;To address functional/ADL transfers PT goals addressed during session: Mobility/safety with mobility        AM-PAC PT "6 Clicks" Daily Activity  Outcome Measure  Difficulty turning over in bed (including adjusting bedclothes, sheets and blankets)?: Unable Difficulty moving from lying on back to sitting on the side of the bed? : Unable Difficulty sitting down on and standing up from a chair with arms (e.g., wheelchair, bedside commode, etc,.)?: Unable Help needed moving to and from a bed to chair (including a wheelchair)?: Total Help needed walking in hospital room?: Total Help needed climbing 3-5 steps  with a railing? : Total 6 Click Score: 6    End of Session Equipment Utilized During Treatment: Gait belt Activity Tolerance: Patient limited by pain Patient left: with call bell/phone within reach;in chair Nurse Communication: Mobility status;Need for lift equipment PT Visit Diagnosis: Other abnormalities of gait and mobility (R26.89);Pain Pain - part of body: Leg(back)     Time: 7829-56211030-1054 PT Time Calculation (min) (ACUTE ONLY): 24 min  Charges:  $Therapeutic Activity: 8-22 mins                    G Codes:  Functional Assessment Tool Used: AM-PAC 6 Clicks Basic Mobility Functional Limitation: Mobility: Walking and moving around Mobility: Walking and Moving Around Current Status (H0865(G8978): 100 percent impaired, limited or restricted Mobility: Walking and Moving Around Goal Status (H8469(G8979): 100 percent impaired, limited or restricted    Lanora ManisElizabeth B. Beverely RisenVan Fleet PT, DPT Acute Rehabilitation  [phone] Pager [phone]     Elon Alaslizabeth B Van Fleet 09/07/2017, 11:26 AM

## 2017-09-08 DIAGNOSIS — F329 Major depressive disorder, single episode, unspecified: Secondary | ICD-10-CM

## 2017-09-08 NOTE — Progress Notes (Signed)
PROGRESS NOTE    Sabrina [name]  JXB:[id number] DOB: [birth year] DOA: 09/05/2017 PCP: Bing Neighbors, FNP   Chief Complaint  Patient presents with  . Leg Pain    Brief Narrative:  Sabrina [name] is a 52 y.o. 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 Neurology consulted. MRI thoracic/lumbar unremarkable. Pending SNF.  Assessment & Plan   Acute lower extremity weakness -No etiology seen on MRI.  -Likely psychogenic, patient admits to stress and anxiety -PT evaluated and recommended SNF -Psychiatry consulted and appreciated, started patient on lexapro -Obtain outside records  Bullous pemphigoid -Ruptured bullae on back  -pending outside records  Back pain -lumbar spine x-ray unremarkable  -Continue pain control  Depression/Anxiety -as above, continue lexapro  DVT Prophylaxis  lovenox  Code Status: Full  Family Communication: None at bedside  Disposition Plan: Admitted. Pending SNF.  Consultants Neurology Psychiatry   Procedures  None  Antibiotics   Anti-infectives (From admission, onward)   None      Subjective:   Sabrina [name] seen and examined today. States she able to move better today as compared to previous days. Can feel cramping in her legs. Was able to walk around.   Objective:   Vitals:   09/07/17 2205 09/07/17 2211 09/07/17 2353 09/08/17 0553  BP: (!) 84/46 (!) 80/45 (!) 94/56 96/60  Pulse: (!) 105  70 100  Resp: 16   16  Temp: 98.1 F (36.7 C)   98.7 F (37.1 C)  TempSrc: Oral   Oral  SpO2: 99%   99%  Weight:  85.7 kg (188 lb 15 oz)    Height:  5\' 4"  (1.626 m)     No intake or output data in the 24 hours ending 09/08/17 1238 Filed Weights   09/07/17 2211  Weight: 85.7 kg (188 lb 15 oz)    Exam  General: Well developed, well nourished, NAD, appears stated age  HEENT: NCAT, mucous  membranes moist.   Cardiovascular: S1 S2 auscultated, RRR, no murmur  Respiratory: Clear to auscultation bilaterally with equal chest rise  Abdomen: Soft, nontender, nondistended, + bowel sounds  Extremities: warm dry without cyanosis clubbing or edema  Neuro: AAOx3, no new deficits, lower extremity weakness  Psych: Anxious however appropriate   Data Reviewed: I have personally reviewed following labs and imaging studies  CBC: Recent Labs  Lab 09/05/17 1115 09/06/17 0004 09/07/17 0315  WBC  --  5.7 5.0  NEUTROABS  --  2.7  --   HGB 12.6 11.7* 10.7*  HCT 37.0 35.3* 32.4*  MCV  --  96.4 96.4  PLT  --  253 231   Basic Metabolic Panel: Recent Labs  Lab 09/05/17 1115 09/06/17 0004 09/07/17 0315  NA 142 139 139  K 4.1 3.7 4.5  CL 108 109 109  CO2  --  24 24  GLUCOSE 84 102* 85  BUN 23* 28* 24*  CREATININE 1.00 0.91 0.82  CALCIUM  --  8.9 8.8*   GFR: Estimated Creatinine Clearance: 87.9 mL/min (by C-G formula based on SCr of 0.82 mg/dL). Liver Function Tests: Recent Labs  Lab 09/06/17 0004  AST 22  ALT 17  ALKPHOS 90  BILITOT 0.4  PROT 6.8  ALBUMIN 3.9   No results for input(s): LIPASE, AMYLASE in the last 168 hours. No results for input(s): AMMONIA in the last 168 hours. Coagulation Profile: Recent Labs  Lab 09/06/17 0004  INR 0.86  Cardiac Enzymes: No results for input(s): CKTOTAL, CKMB, CKMBINDEX, TROPONINI in the last 168 hours. BNP (last 3 results) No results for input(s): PROBNP in the last 8760 hours. HbA1C: No results for input(s): HGBA1C in the last 72 hours. CBG: No results for input(s): GLUCAP in the last 168 hours. Lipid Profile: No results for input(s): CHOL, HDL, LDLCALC, TRIG, CHOLHDL, LDLDIRECT in the last 72 hours. Thyroid Function Tests: No results for input(s): TSH, T4TOTAL, FREET4, T3FREE, THYROIDAB in the last 72 hours. Anemia Panel: No results for input(s): VITAMINB12, FOLATE, FERRITIN, TIBC, IRON, RETICCTPCT in the last  72 hours. Urine analysis:    Component Value Date/Time   COLORURINE YELLOW 09/06/2017 1530   APPEARANCEUR CLOUDY (A) 09/06/2017 1530   LABSPEC 1.034 (H) 09/06/2017 1530   PHURINE 5.0 09/06/2017 1530   GLUCOSEU NEGATIVE 09/06/2017 1530   HGBUR NEGATIVE 09/06/2017 1530   BILIRUBINUR NEGATIVE 09/06/2017 1530   KETONESUR NEGATIVE 09/06/2017 1530   PROTEINUR NEGATIVE 09/06/2017 1530   UROBILINOGEN 0.2 07/05/2015 0627   NITRITE NEGATIVE 09/06/2017 1530   LEUKOCYTESUR MODERATE (A) 09/06/2017 1530   Sepsis Labs: @LABRCNTIP (procalcitonin:4,lacticidven:4)  ) Recent Results (from the past 240 hour(s))  Blood culture (routine x 2)     Status: None (Preliminary result)   Collection Time: 09/06/17 12:04 AM  Result Value Ref Range Status   Specimen Description BLOOD RIGHT HAND  Final   Special Requests   Final    BOTTLES DRAWN AEROBIC AND ANAEROBIC Blood Culture adequate volume   Culture   Final    NO GROWTH 1 DAY Performed at [facility], [address] [place], [place] [address]    Report Status PENDING  Incomplete  Blood culture (routine x 2)     Status: None (Preliminary result)   Collection Time: 09/06/17 12:47 AM  Result Value Ref Range Status   Specimen Description BLOOD LEFT ANTECUBITAL  Final   Special Requests   Final    BOTTLES DRAWN AEROBIC AND ANAEROBIC Blood Culture adequate volume   Culture   Final    NO GROWTH 1 DAY Performed at [facility], [address] [place], [place] [address]    Report Status PENDING  Incomplete      Radiology Studies: Dg Lumbar Spine 2-3 Views  Result Date: 09/07/2017 CLINICAL DATA:  51 year old female with back pain. No reported injury. Initial encounter. EXAM: LUMBAR SPINE - 2-3 VIEW COMPARISON:  09/06/2017 MR. FINDINGS: There is no evidence of lumbar spine fracture. Alignment is normal. Intervertebral disc spaces are maintained. IMPRESSION: Negative. Electronically Signed   By: Lacy DuverneySteven  Olson M.D.   On: 09/07/2017 14:51    Mr Thoracic Spine Wo Contrast  Result Date: 09/06/2017 CLINICAL DATA:  Hemiplegia beginning Christmas morning. History of animal bite. EXAM: MRI THORACIC SPINE WITHOUT CONTRAST TECHNIQUE: Multiplanar, multisequence MR imaging of the thoracic spine was performed. No intravenous contrast was administered. COMPARISON:  MRI of the lumbar spine with contrast September 06, 2017 at 0408 hours. CT abdomen and pelvis November 03, 2013. FINDINGS: ALIGNMENT: Maintenance of the thoracic kyphosis. No malalignment. VERTEBRAE/DISCS: Vertebral bodies are intact. Intervertebral discs morphology and signal are normal. CORD: Thoracic spinal cord is normal morphology and signal characteristics to the level of the conus medullaris which terminates at T12-L1. PREVERTEBRAL AND PARASPINAL SOFT TISSUES: Multiple T2 bright masses in the liver measuring to 15 mm similar to prior CT which classified lesions as cysts and hemangiomas. DISC LEVELS (moderately motion degraded axial T2 gradient: No disc bulge, canal stenosis nor  neural foraminal narrowing. IMPRESSION: Negative motion degraded noncontrast MRI of the thoracic spine. Electronically Signed   By: Awilda Metroourtnay  Bloomer M.D.   On: 09/06/2017 22:07     Scheduled Meds: . enoxaparin (LOVENOX) injection  40 mg Subcutaneous Q24H  . escitalopram  5 mg Oral Daily  . gabapentin  600 mg Oral TID   Continuous Infusions:   LOS: 2 days   Time Spent in minutes   30 minutes  Elisa Sorlie D.O. on 09/08/2017 at 12:38 PM  Between 7am to 7pm - Pager - [phone]  After 7pm go to [email or web address] - password [email or web address]  And look for the night coverage person covering for me after hours  [facility] Office  [phone]

## 2017-09-08 NOTE — Progress Notes (Signed)
Pt's BP read 84/46 at 2200, repeated manually, read 80/45, though pt said her BP usually runs low, NP Tama GanderKatherine Schorr (on call) paged and notified, ordered a bolus of NS 500c same given at 2243, BP check at 2353 after the bolus, came up to 94/ 56, pt reassured in bed, will however continue to monitor. Obasogie-Asidi, Omer Monter Efe

## 2017-09-08 NOTE — Care Management Note (Signed)
Case Management Note  Patient Details  Name: Sabrina [name] MRN: [id number] Date of Birth: 03/15/1966  Subjective/Objective:    Pt admitted with generalized anxiety disorder. She is from home alone.                 Action/Plan: PT/OT recommending SNF. Pt has no insurance and will need LOG for SNF rehab. CSW aware. CM following for d/c disposition.  Expected Discharge Date:                  Expected Discharge Plan:  Skilled Nursing Facility  In-House Referral:  Clinical Social Work  Discharge planning Services     Post Acute Care Choice:    Choice offered to:     DME Arranged:    DME Agency:     HH Arranged:    HH Agency:     Status of Service:  In process, will continue to follow  If discussed at Long Length of Stay Meetings, dates discussed:    Additional Comments:  Kermit BaloKelli F Deran Barro, RN 09/08/2017, 11:30 AM

## 2017-09-08 NOTE — Progress Notes (Signed)
Physical Therapy Progress Note  Clinical Impression: Pt is making good progress towards her goals today, however continues to be limited by bilateral LE weakness. Pt has regained some feeling down to her knees. Pt is currently minA for bed mobility, minAx2 for transfers with Stedy and RW and minAx2 for ambulation of 3 feet with RW. D/c recommendations remain appropriate at this time. PT will continue to follow pt acutely.   09/08/17 1430  PT Visit Information  Last PT Received On 09/08/17  Assistance Needed +2  History of Present Illness Sabrina [name] is a 51 y.o. female with a history of bullous pemphigoid who presents with LE weakness. She states that around 2:30 in the morning on christmas morning, she began having  Some tingling in her legs. She went to the ER regarding rash, and complained of some nausea, and there was concern for anxiety. Pt reports she cannot feel bil LEs hip to anlkles.   Subjective Data  Patient Stated Goal to find out what is going on  Precautions  Precautions Fall  Restrictions  Weight Bearing Restrictions No  Pain Assessment  Pain Assessment 0-10  Pain Score 3  Pain Location back and hips   Pain Descriptors / Indicators Aching;Grimacing;Guarding  Pain Intervention(s) Monitored during session;Limited activity within patient's tolerance;Repositioned  Cognition  Arousal/Alertness Awake/alert  Behavior During [facility] for tasks assessed/performed  Overall Cognitive Status Within Functional Limits for tasks assessed  Bed Mobility  Overal bed mobility Needs Assistance  Bed Mobility Supine to Sit  Sit to supine Min assist  General bed mobility comments minA for LE management into bed  Transfers  Overall transfer level Needs assistance  Equipment used Rolling walker (2 wheeled)  Transfer via Psychologist, prison and probation servicesLift Equipment Stedy  Transfers Sit to/from Stand  Sit to [employer]Stand Min assist;+2 physical assistance  General transfer comment minAx2 for power up and steadying in [place]  for transport to bathroom, after toileting able to sit<>stand to RW for ambulation vc weightshift, and picking up LE  Ambulation/Gait  Ambulation/Gait assistance Min assist;+2 physical assistance  Ambulation Distance (Feet) 3 Feet  Assistive device Rolling walker (2 wheeled)  Gait Pattern/deviations Step-to pattern;Decreased step length - right;Decreased step length - left;Shuffle  General Gait Details minAx2 for support with weightshifting, pt able to advance bilateral LE with lateral weightshift and maximal UE support, pt able to achieve minimal R knee flexion with advancement of R LE  Gait velocity slowed  Gait velocity interpretation Below normal speed for age/gender  Balance  Overall balance assessment Needs assistance  Sitting-balance support Feet supported;No upper extremity supported  Sitting balance-Leahy Scale Good  Sitting balance - Comments able to reach for soap and towels outside BoS while washing her hands at the sink  Standing balance support Bilateral upper extremity supported  Standing balance-Leahy Scale Poor  Standing balance comment requires RW and minA for maintaining standing balance  PT - End of Session  Equipment Utilized During Treatment Gait belt  Activity Tolerance Patient limited by pain  Patient left with call bell/phone within reach;in chair  Nurse Communication Mobility status;Need for lift equipment  PT - Assessment/Plan  PT Plan Current plan remains appropriate  PT Visit Diagnosis Other abnormalities of gait and mobility (R26.89);Pain  Pain - Right/Left (bilateral )  Pain - part of body Leg (back)  PT Frequency (ACUTE ONLY) Min 3X/week  Follow Up Recommendations SNF;Supervision/Assistance - 24 hour  PT equipment Other (comment) (TBA)  AM-PAC PT "6 Clicks" Daily Activity Outcome Measure  Difficulty turning over  in bed (including adjusting bedclothes, sheets and blankets)? 4  Difficulty moving from lying on back to sitting on the side of the bed?  1   Difficulty sitting down on and standing up from a chair with arms (e.g., wheelchair, bedside commode, etc,.)? 1  Help needed moving to and from a bed to chair (including a wheelchair)? 1  Help needed walking in hospital room? 1  Help needed climbing 3-5 steps with a railing?  1  6 Click Score 9  Mobility G Code  CL  PT Goal Progression  Progress towards PT goals Progressing toward goals  Acute Rehab PT Goals  PT Goal Formulation With patient  Time For Goal Achievement 09/20/17  Potential to Achieve Goals Good  PT Time Calculation  PT Start Time (ACUTE ONLY) 0945  PT Stop Time (ACUTE ONLY) 1003  PT Time Calculation (min) (ACUTE ONLY) 18 min  PT G-Codes **NOT FOR INPATIENT CLASS**  Functional Assessment Tool Used AM-PAC 6 Clicks Basic Mobility  Functional Limitation Mobility: Walking and moving around  Mobility: Walking and Moving Around Current Status (Z6109(G8978) CN  Mobility: Walking and Moving Around Goal Status (U0454(G8979) CN  PT General Charges  $$ ACUTE PT VISIT 1 Visit  PT Treatments  $Therapeutic Activity 8-22 mins

## 2017-09-08 NOTE — Progress Notes (Signed)
Physical Therapy Treatment Patient Details Name: Sabrina [name] MRN: [id number] DOB: 11-26-1965 Today's Date: 09/08/2017    History of Present Illness Sabrina [name] is a 51 y.o. female with a history of bullous pemphigoid who presents with LE weakness. She states that around 2:30 in the morning on christmas morning, she began having  Some tingling in her legs. She went to the ER regarding rash, and complained of some nausea, and there was concern for anxiety. Pt reports she cannot feel bil LEs hip to anlkles.     PT Comments    Patient progressing well towards PT goals. RN requested assist to transfer pt back to bed however upon entering room, pt wanting to use bathroom. Requires Min A to stand using stedy from chair and from toilet. Demonstrates good trunk/core strength as pt able to reach outside BoS and wash hands at sink without LOB or difficulty. Reports sensation is improving in BLEs and reports cramping in thighs and bil groin area. Will continue to follow.    Follow Up Recommendations  SNF;Supervision/Assistance - 24 hour     Equipment Recommendations  Other (comment)(TBA)    Recommendations for Other Services       Precautions / Restrictions Precautions Precautions: Fall Restrictions Weight Bearing Restrictions: No    Mobility  Bed Mobility Overal bed mobility: Needs Assistance Bed Mobility: Sit to Supine      Sit to supine: Min guard;HOB elevated   General bed mobility comments: Able to bring LEs into bed to return to supine using core, no assist needed.   Transfers Overall transfer level: Needs assistance Equipment used: Rolling walker (2 wheeled) Transfers: Sit to/from Stand Sit to Stand: Min assist         General transfer comment: Assist to power to standing using stedy from chair x1, from toilet x1, transferred back to bed after sitting in chair for a few hours.   Ambulation/Gait          General Gait Details: Deferred   Stairs            Wheelchair Mobility    Modified Rankin (Stroke Patients Only)       Balance Overall balance assessment: Needs assistance Sitting-balance support: Feet supported;No upper extremity supported Sitting balance-Leahy Scale: Good Sitting balance - Comments: Able to reach outside bOS and doff socks without difficulty. Demonstrates good core strength as pt able to reach outside BoS to wash hands at sink on stedy.                                    Cognition Arousal/Alertness: Awake/alert Behavior During Therapy: WFL for tasks assessed/performed Overall Cognitive Status: Within Functional Limits for tasks assessed                                        Exercises      General Comments        Pertinent Vitals/Pain Pain Assessment: Faces Pain Score: 3  Faces Pain Scale: Hurts little more Pain Location: cramps in bil thighs and pain in groin Pain Descriptors / Indicators: Cramping;Aching;Sore Pain Intervention(s): Monitored during session;Repositioned    Home Living                      Prior Function  PT Goals (current goals can now be found in the care plan section) Acute Rehab PT Goals Patient Stated Goal: to find out what is going on PT Goal Formulation: With patient Time For Goal Achievement: 09/20/17 Potential to Achieve Goals: Good Progress towards PT goals: Progressing toward goals    Frequency    Min 3X/week      PT Plan Current plan remains appropriate    Co-evaluation              AM-PAC PT "6 Clicks" Daily Activity  Outcome Measure  Difficulty turning over in bed (including adjusting bedclothes, sheets and blankets)?: None Difficulty moving from lying on back to sitting on the side of the bed? : Unable Difficulty sitting down on and standing up from a chair with arms (e.g., wheelchair, bedside commode, etc,.)?: Unable Help needed moving to and from a bed to chair (including a wheelchair)?:  Total Help needed walking in hospital room?: Total Help needed climbing 3-5 steps with a railing? : Total 6 Click Score: 9    End of Session Equipment Utilized During Treatment: Gait belt Activity Tolerance: Patient tolerated treatment well Patient left: with call bell/phone within reach;in bed;with bed alarm set Nurse Communication: Mobility status;Need for lift equipment PT Visit Diagnosis: Other abnormalities of gait and mobility (R26.89);Pain Pain - Right/Left: (bilateral) Pain - part of body: Leg(groin, thighs)     Time: 1326-1340 PT Time Calculation (min) (ACUTE ONLY): 14 min  Charges:  $Therapeutic Activity: 8-22 mins                    G Codes:  Functional Assessment Tool Used: AM-PAC 6 Clicks Basic Mobility Functional Limitation: Mobility: Walking and moving around Mobility: Walking and Moving Around Current Status ([id number](G8978): 100 percent impaired, limited or restricted Mobility: Walking and Moving Around Goal Status (U0454(G8979): 100 percent impaired, limited or restricted    Sabrina [name], [place], DPT [phone]     Sabrina [name] 09/08/2017, 2:31 PM

## 2017-09-08 NOTE — Social Work (Signed)
CSW continuing to look for SNF that would take pt with LOG.  CSW continuing to follow.   Doy HutchingIsabel H [name], LCSWA [facility] Clinical Social Work [phone]

## 2017-09-09 DIAGNOSIS — N390 Urinary tract infection, site not specified: Secondary | ICD-10-CM

## 2017-09-09 LAB — URINALYSIS, ROUTINE W REFLEX MICROSCOPIC
BILIRUBIN URINE: NEGATIVE
Glucose, UA: NEGATIVE mg/dL
Ketones, ur: NEGATIVE mg/dL
NITRITE: NEGATIVE
PH: 6 (ref 5.0–8.0)
Protein, ur: 100 mg/dL — AB
SPECIFIC GRAVITY, URINE: 1.013 (ref 1.005–1.030)
Squamous Epithelial / LPF: NONE SEEN

## 2017-09-09 LAB — BASIC METABOLIC PANEL
Anion gap: 8 (ref 5–15)
BUN: 16 mg/dL (ref 6–20)
CHLORIDE: 104 mmol/L (ref 101–111)
CO2: 29 mmol/L (ref 22–32)
Calcium: 9.1 mg/dL (ref 8.9–10.3)
Creatinine, Ser: 0.82 mg/dL (ref 0.44–1.00)
GFR calc non Af Amer: >60 mL/min (ref >60)
Glucose, Bld: 88 mg/dL (ref 65–99)
POTASSIUM: 4.3 mmol/L (ref 3.5–5.1)
SODIUM: 141 mmol/L (ref 135–145)

## 2017-09-09 LAB — CBC
HEMATOCRIT: 34.6 % — AB (ref 36.0–46.0)
HEMOGLOBIN: 11.2 g/dL — AB (ref 12.0–15.0)
MCH: 31.2 pg (ref 26.0–34.0)
MCHC: 32.4 g/dL (ref 30.0–36.0)
MCV: 96.4 fL (ref 78.0–100.0)
Platelets: 222 10*3/uL (ref 150–400)
RBC: 3.59 MIL/uL — AB (ref 3.87–5.11)
RDW: 13.4 % (ref 11.5–15.5)
WBC: 4 10*3/uL (ref 4.0–10.5)

## 2017-09-09 MED ORDER — HYOSCYAMINE SULFATE 0.125 MG PO TBDP
0.2500 mg | ORAL_TABLET | Freq: Four times a day (QID) | ORAL | Status: AC | PRN
  Administered 2017-09-09 – 2017-09-10 (×2): 0.25 mg via ORAL
  Filled 2017-09-09 (×4): qty 2

## 2017-09-09 MED ORDER — DEXTROSE 5 % IV SOLN
1.0000 g | INTRAVENOUS | Status: DC
  Administered 2017-09-09: 1 g via INTRAVENOUS
  Filled 2017-09-09 (×2): qty 10

## 2017-09-09 MED ORDER — PHENAZOPYRIDINE HCL 100 MG PO TABS
100.0000 mg | ORAL_TABLET | Freq: Three times a day (TID) | ORAL | Status: DC
  Administered 2017-09-09 – 2017-09-10 (×3): 100 mg via ORAL
  Filled 2017-09-09 (×5): qty 1

## 2017-09-09 MED ORDER — HYOSCYAMINE SULFATE 0.125 MG/ML PO SOLN
0.2500 mg | Freq: Four times a day (QID) | ORAL | Status: DC | PRN
  Filled 2017-09-09: qty 2

## 2017-09-09 NOTE — Progress Notes (Signed)
Patient reports passing a large clot of blood in the commode and flushed it; continues to report pain and burning with urination; MD notified; patient instructed to save all urine for assessment.

## 2017-09-09 NOTE — Progress Notes (Signed)
Patient reports burning when she urinates and reports seeing blood in the urine; MD made aware; orders received.

## 2017-09-09 NOTE — Progress Notes (Signed)
Physical Therapy Treatment Patient Details Name: Sabrina [name] MRN: [id number] DOB: 1966-01-06 Today's Date: 09/09/2017    History of Present Illness Patient is a 51 y.o. female with a history of bullous pemphigoid who presents with LE weakness.     PT Comments    Patient progressing very well. All mobility goals met and updated. Patient ambulating with and without RW, no physical assist required. Patient does endorse continued soreness but overall has improved significantly. States that she would like to go home and that she has family who can provide 24/7 if needed. Given current functional status and improvements, feel d/c home is appropriate. Will continue to see acutely. Highly encourage patient to mobilize with staff.   Follow Up Recommendations  Outpatient PT;Supervision/Assistance - 24 hour     Equipment Recommendations  None recommended by PT    Recommendations for Other Services       Precautions / Restrictions Precautions Precautions: Fall Restrictions Weight Bearing Restrictions: No    Mobility  Bed Mobility Overal bed mobility: Modified Independent             General bed mobility comments: increased time to perform, no physical assist needed for any aspect of mobility  Transfers Overall transfer level: Needs assistance Equipment used: Rolling walker (2 wheeled);None Transfers: Sit to/from Stand Sit to Stand: Supervision         General transfer comment: no physical assist required, increased time to elevate to upright  Ambulation/Gait Ambulation/Gait assistance: Supervision Ambulation Distance (Feet): 180 Feet Assistive device: Rolling walker (2 wheeled)(additional 10 ft HHA then 20 ft no device) Gait Pattern/deviations: Step-through pattern;Decreased stride length Gait velocity: decreased Gait velocity interpretation: Below normal speed for age/gender General Gait Details: slow and guarded with gait, VCs for increased gait speed and upright  posture. Patient tolerated increased distance with very little reliance on RW, then was able to mobilize without device. No overt LOB or weakness noted   Stairs            Wheelchair Mobility    Modified Rankin (Stroke Patients Only)       Balance Overall balance assessment: Needs assistance Sitting-balance support: Feet supported;No upper extremity supported Sitting balance-Leahy Scale: Good Sitting balance - Comments: able to reach for soap and towels outside BoS while washing her hands at the sink   Standing balance support: During functional activity Standing balance-Leahy Scale: Fair Standing balance comment: able to stand and ambulate in room without device                            Cognition Arousal/Alertness: Awake/alert Behavior During Therapy: WFL for tasks assessed/performed Overall Cognitive Status: Within Functional Limits for tasks assessed                                        Exercises      General Comments        Pertinent Vitals/Pain Pain Assessment: 0-10 Pain Score: 4  Pain Location: back and hips  Pain Descriptors / Indicators: Sore;Aching Pain Intervention(s): Monitored during session    Home Living                      Prior Function            PT Goals (current goals can now be found in the care plan section) Acute  Rehab PT Goals Patient Stated Goal: to go home PT Goal Formulation: With patient Time For Goal Achievement: 09/20/17 Potential to Achieve Goals: Good Progress towards PT goals: Progressing toward goals    Frequency    Min 3X/week      PT Plan Discharge plan needs to be updated    Co-evaluation              AM-PAC PT "6 Clicks" Daily Activity  Outcome Measure  Difficulty turning over in bed (including adjusting bedclothes, sheets and blankets)?: None Difficulty moving from lying on back to sitting on the side of the bed? : None Difficulty sitting down on and  standing up from a chair with arms (e.g., wheelchair, bedside commode, etc,.)?: A Little Help needed moving to and from a bed to chair (including a wheelchair)?: A Little Help needed walking in hospital room?: A Little Help needed climbing 3-5 steps with a railing? : A Little 6 Click Score: 20    End of Session Equipment Utilized During Treatment: Gait belt Activity Tolerance: Patient limited by pain Patient left: with call bell/phone within reach;in chair Nurse Communication: Mobility status;Need for lift equipment PT Visit Diagnosis: Other abnormalities of gait and mobility (R26.89);Pain Pain - Right/Left: (bilateral ) Pain - part of body: Leg(back)     Time: 1031-1050 PT Time Calculation (min) (ACUTE ONLY): 19 min  Charges:  $Gait Training: 8-22 mins                    G Codes:       Alben Deeds, PT DPT  Board Certified Neurologic Specialist [place] 09/09/2017, 10:59 AM

## 2017-09-09 NOTE — Progress Notes (Signed)
ANTIBIOTIC CONSULT NOTE - INITIAL  Pharmacy Consult for ceftriaxone  Indication: UTI  Allergies  Allergen Reactions  . Zofran [Ondansetron Hcl] Hives and Other (See Comments)    Spots around iv site  . Droperidol Palpitations  . Flexeril [Cyclobenzaprine] Anxiety  . Morphine And Related Itching  . Tessalon [Benzonatate] Other (See Comments)    Watery eyes    Patient Measurements: Height: 5\' 4"  (162.6 cm) Weight: 188 lb 15 oz (85.7 kg) IBW/kg (Calculated) : 54.7   Vital Signs: Temp: 98.6 F (37 C) (12/29 1022) Temp Source: Oral (12/29 1022) BP: 100/69 (12/29 1022) Pulse Rate: 81 (12/29 1022) Intake/Output from previous day: No intake/output data recorded. Intake/Output from this shift: No intake/output data recorded.  Labs: Recent Labs    09/07/17 0315 09/09/17 0416  WBC 5.0 4.0  HGB 10.7* 11.2*  PLT 231 222  CREATININE 0.82 0.82   Estimated Creatinine Clearance: 87.9 mL/min (by C-G formula based on SCr of 0.82 mg/dL). No results for input(s): VANCOTROUGH, VANCOPEAK, VANCORANDOM, GENTTROUGH, GENTPEAK, GENTRANDOM, TOBRATROUGH, TOBRAPEAK, TOBRARND, AMIKACINPEAK, AMIKACINTROU, AMIKACIN in the last 72 hours.   Microbiology: Recent Results (from the past 720 hour(s))  Blood culture (routine x 2)     Status: None (Preliminary result)   Collection Time: 09/06/17 12:04 AM  Result Value Ref Range Status   Specimen Description BLOOD RIGHT HAND  Final   Special Requests   Final    BOTTLES DRAWN AEROBIC AND ANAEROBIC Blood Culture adequate volume   Culture   Final    NO GROWTH 2 DAYS Performed at [facility], [address] [place], [place] [address]    Report Status PENDING  Incomplete  Blood culture (routine x 2)     Status: None (Preliminary result)   Collection Time: 09/06/17 12:47 AM  Result Value Ref Range Status   Specimen Description BLOOD LEFT ANTECUBITAL  Final   Special Requests   Final    BOTTLES DRAWN AEROBIC AND ANAEROBIC Blood Culture adequate  volume   Culture   Final    NO GROWTH 2 DAYS Performed at [facility], 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 Currently waiting for placement at SNF and reports burning with urination and UA showing rare bacteria. Urine culture has been sent to lab. Wbc is within normal limits and patient is afebrile at this time.   Goal of Therapy:  Ceftriaxone 1g IV q24h   Plan:  Continue to monitor for clinical resolution, LOT Follow up culture results  Blake DivineShannon Lovelace Cerveny, Pharm.D. PGY1 Pharmacy Resident 09/09/2017 10:45 AM Main Pharmacy: [phone]

## 2017-09-09 NOTE — Clinical Social Work Note (Signed)
Pt still has no bed offers. CSW will continue to reach out to facilities and Chiropodistassistant director to facilitate placement.   TennantBridget Adir Schicker, [place] [phone]

## 2017-09-09 NOTE — Progress Notes (Signed)
PROGRESS NOTE    Sabrina JabsDana L Vora  ZOX:[id number] DOB: 10/18/1965 DOA: 09/05/2017 PCP: Bing NeighborsHarris, Kimberly S, FNP   Chief Complaint  Patient presents with  . Leg Pain    Brief Narrative:  Sabrina [name] is a 51 y.o. 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 Neurology consulted. MRI thoracic/lumbar unremarkable. Pending SNF.  Assessment & Plan   Acute lower extremity weakness -No etiology seen on MRI.  -Likely psychogenic, patient admits to stress and anxiety -PT evaluated and recommended SNF -Psychiatry consulted and appreciated, started patient on lexapro -Obtain outside records -patient feels she is improving -will ask PT to re-evaluate patient   UTI -patient with burning pain with urination and blood -UA obtained: -Urine culture pending -will start patient on IV ceftriaxone -will start pyridium for burning sensation  Bullous pemphigoid -Ruptured bullae on back  -pending outside records  Back pain -lumbar spine x-ray unremarkable  -Continue pain control  Depression/Anxiety -as above, continue lexapro  DVT Prophylaxis  lovenox  Code Status: Full  Family Communication: None at bedside  Disposition Plan: Admitted. Pending SNF.  Consultants Neurology Psychiatry   Procedures  None  Antibiotics   Anti-infectives (From admission, onward)   None      Subjective:   Sabrina [name] seen and examined today. Complains of painful urination and burning sensation. Feels her is able to move her legs more today. Denies chest pain, shortness of breath, abdominal pain, nausea, vomiting, diarrhea, constipation, dizziness, headache.  Objective:   Vitals:   09/08/17 1427 09/08/17 2100 09/09/17 0139 09/09/17 0441  BP: 108/63 91/64 (!) 100/59 (!) 95/94  Pulse: 98 72  81  Resp: 16 16 16 16   Temp: 98 F (36.7 C) 98.9 F (37.2 C)  98.9 F (37.2 C) 97.7 F (36.5 C)  TempSrc: Oral Oral Oral Oral  SpO2: 100% 98% 96% 99%  Weight:      Height:       No intake or output data in the 24 hours ending 09/09/17 1011 Filed Weights   09/07/17 2211  Weight: 85.7 kg (188 lb 15 oz)    Exam  General: Well developed, well nourished, NAD, appears stated age  HEENT: NCAT, mucous membranes moist.   Cardiovascular: S1 S2 auscultated, no murmur, RRR  Respiratory: Clear to auscultation bilaterally with equal chest rise, no wheezing  Abdomen: Soft, nontender, nondistended, + bowel sounds  Extremities: warm dry without cyanosis clubbing or edema  Neuro: AAOx3, no new deficits, lower extremity weakness-improving   Psych: Appropriate mood and affect   Data Reviewed: I have personally reviewed following labs and imaging studies  CBC: Recent Labs  Lab 09/05/17 1115 09/06/17 0004 09/07/17 0315 09/09/17 0416  WBC  --  5.7 5.0 4.0  NEUTROABS  --  2.7  --   --   HGB 12.6 11.7* 10.7* 11.2*  HCT 37.0 35.3* 32.4* 34.6*  MCV  --  96.4 96.4 96.4  PLT  --  253 231 222   Basic Metabolic Panel: Recent Labs  Lab 09/05/17 1115 09/06/17 0004 09/07/17 0315 09/09/17 0416  NA 142 139 139 141  K 4.1 3.7 4.5 4.3  CL 108 109 109 104  CO2  --  24 24 29   GLUCOSE 84 102* 85 88  BUN 23* 28* 24* 16  CREATININE 1.00 0.91 0.82 0.82  CALCIUM  --  8.9 8.8* 9.1   GFR: Estimated Creatinine Clearance: 87.9 mL/min (by C-G formula based on  SCr of 0.82 mg/dL). Liver Function Tests: Recent Labs  Lab 09/06/17 0004  AST 22  ALT 17  ALKPHOS 90  BILITOT 0.4  PROT 6.8  ALBUMIN 3.9   No results for input(s): LIPASE, AMYLASE in the last 168 hours. No results for input(s): AMMONIA in the last 168 hours. Coagulation Profile: Recent Labs  Lab 09/06/17 0004  INR 0.86   Cardiac Enzymes: No results for input(s): CKTOTAL, CKMB, CKMBINDEX, TROPONINI in the last 168 hours. BNP (last 3 results) No results for input(s): PROBNP in the last  8760 hours. HbA1C: No results for input(s): HGBA1C in the last 72 hours. CBG: No results for input(s): GLUCAP in the last 168 hours. Lipid Profile: No results for input(s): CHOL, HDL, LDLCALC, TRIG, CHOLHDL, LDLDIRECT in the last 72 hours. Thyroid Function Tests: No results for input(s): TSH, T4TOTAL, FREET4, T3FREE, THYROIDAB in the last 72 hours. Anemia Panel: No results for input(s): VITAMINB12, FOLATE, FERRITIN, TIBC, IRON, RETICCTPCT in the last 72 hours. Urine analysis:    Component Value Date/Time   COLORURINE AMBER (A) 09/09/2017 0843   APPEARANCEUR CLOUDY (A) 09/09/2017 0843   LABSPEC 1.013 09/09/2017 0843   PHURINE 6.0 09/09/2017 0843   GLUCOSEU NEGATIVE 09/09/2017 0843   HGBUR LARGE (A) 09/09/2017 0843   BILIRUBINUR NEGATIVE 09/09/2017 0843   KETONESUR NEGATIVE 09/09/2017 0843   PROTEINUR 100 (A) 09/09/2017 0843   UROBILINOGEN 0.2 07/05/2015 0627   NITRITE NEGATIVE 09/09/2017 0843   LEUKOCYTESUR LARGE (A) 09/09/2017 0843   Sepsis Labs: @LABRCNTIP (procalcitonin:4,lacticidven:4)  ) Recent Results (from the past 240 hour(s))  Blood culture (routine x 2)     Status: None (Preliminary result)   Collection Time: 09/06/17 12:04 AM  Result Value Ref Range Status   Specimen Description BLOOD RIGHT HAND  Final   Special Requests   Final    BOTTLES DRAWN AEROBIC AND ANAEROBIC Blood Culture adequate volume   Culture   Final    NO GROWTH 2 DAYS Performed at [facility], [address] [place], [place] [address]    Report Status PENDING  Incomplete  Blood culture (routine x 2)     Status: None (Preliminary result)   Collection Time: 09/06/17 12:47 AM  Result Value Ref Range Status   Specimen Description BLOOD LEFT ANTECUBITAL  Final   Special Requests   Final    BOTTLES DRAWN AEROBIC AND ANAEROBIC Blood Culture adequate volume   Culture   Final    NO GROWTH 2 DAYS Performed at [facility], [address] [place], [place] [address]    Report Status  PENDING  Incomplete      Radiology Studies: Dg Lumbar Spine 2-3 Views  Result Date: 09/07/2017 CLINICAL DATA:  51 year old female with back pain. No reported injury. Initial encounter. EXAM: LUMBAR SPINE - 2-3 VIEW COMPARISON:  09/06/2017 MR. FINDINGS: There is no evidence of lumbar spine fracture. Alignment is normal. Intervertebral disc spaces are maintained. IMPRESSION: Negative. Electronically Signed   By: Lacy DuverneySteven  Olson M.D.   On: 09/07/2017 14:51     Scheduled Meds: . enoxaparin (LOVENOX) injection  40 mg Subcutaneous Q24H  . escitalopram  5 mg Oral Daily  . gabapentin  600 mg Oral TID   Continuous Infusions:   LOS: 3 days   Time Spent in minutes   45 minutes  Jamar Casagrande D.O. on 09/09/2017 at 10:11 AM  Between 7am to 7pm - Pager - [phone]  After 7pm go to [email or web address] - password [email or web address]  And look  for the night coverage person covering for me after hours  [facility] Office  [phone]

## 2017-09-10 LAB — URINE CULTURE

## 2017-09-10 MED ORDER — ESCITALOPRAM OXALATE 5 MG PO TABS: 5.0000 mg | ORAL_TABLET | Freq: Every day | ORAL | 0 refills | Status: DC

## 2017-09-10 MED ORDER — CEFUROXIME AXETIL 500 MG PO TABS
500.0000 mg | ORAL_TABLET | Freq: Two times a day (BID) | ORAL | Status: DC
  Administered 2017-09-10: 500 mg via ORAL
  Filled 2017-09-10: qty 1

## 2017-09-10 MED ORDER — PHENAZOPYRIDINE HCL 100 MG PO TABS: 100.0000 mg | ORAL_TABLET | Freq: Three times a day (TID) | ORAL | 0 refills | Status: DC

## 2017-09-10 MED ORDER — CEFUROXIME AXETIL 500 MG PO TABS: 500.0000 mg | ORAL_TABLET | Freq: Two times a day (BID) | ORAL | 0 refills | Status: DC

## 2017-09-10 NOTE — Discharge Summary (Signed)
Physician Discharge Summary  Sabrina [name] ERD:[id number] DOB: 12/29/1965 DOA: 09/05/2017  PCP: Scot Jun, FNP  Admit date: 09/05/2017 Discharge date: 09/10/2017  Time spent: 45 minutes  Recommendations for Outpatient Follow-up:  Patient will be discharged to home with outpatient physical therapy.  Patient will need to follow up with primary care provider within one week of discharge.  Follow up psychiatry. Patient should continue medications as prescribed.  Patient should follow a regular diet.   Discharge Diagnoses:  Acute lower extremity weakness UTI Bullous pemphigoid Back pain Depression/Anxiety  Discharge Condition: stable  Diet recommendation: regular  Filed Weights   09/07/17 2211  Weight: 85.7 kg (188 lb 15 oz)    History of present illness:  On 09/06/2017 by Dr. Oretha Milch Sabrina [name] is a 51 y.o. 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    Symptoms started yesterday patient states she noted increased tingling and numbness of both her lower legs in the morning by the afternoon.  That then progressed to total weakness.  She had to crawl around her house to move.  She does report persistent lower back pain pain.  She believes her back pain is associated with her recent flare of bullous pemphigoud she denies any recent sick contacts, fevers, chills, dysuria, abdominal pain, nausea or vomiting, cough.  She has decreased strength in her right hand due to metal bar puncturing her wrist over 6 months ago.  Otherwise no upper extremity weakness or numbness.  No recent fall or other trauma.  She reports new onset difficulty swallowing liquids in the past day.  She states prior to this presentation she had been seen in the ED a few days prior to for worsening anxiety and frequent panic attacks.  Her family nurse  practitioner has been managing this issue with Klonopin however she had ran out of her medication approximately a month ago and was given some supplemental medicine in the ED.  She states she had a similar presentation of acute lower extremity paralysis in 2001 after the diagnosis of her bullous pemphigoid related to a brown recluse spider bite in [place].  At that time she reports receiving an MRI of the spine which was also normal.  Her neurologic deficits at that time resolved with treatment of her bullous pemphigoid and a bone marrow biopsy for on clearly disease.  Patient states since then her bullous pemphigoid has been well controlled until recently.  It tends to flare during times of stress.  Her last flare was 3 years ago after the passing of her mother.  Has noticed blisters of her upper and lower back.  Patient admits increased stress for multiple reasons: She has been him her coworkers comp after sustaining trauma at her job.  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  She states it has been difficult living on less money, not doing the job that she left, and depending on others for financial help.  Additionally her father is a poor state of health issues she can visit him and her daughter during this time period.   Hospital Course:  Acute lower extremity weakness -No etiology seen on MRI.  -Likely psychogenic, patient admits to stress and anxiety -PT evaluated and recommended SNF -Psychiatry consulted and appreciated, started patient on lexapro -Obtain outside records -patient feels she is improving -PT asked  to reevaluate, now recommending outpatient PT  UTI -patient with burning pain with urination and blood -UA obtained: rare bacteria, WBC TNTC, large leukocytes, large hgb -Urine culture pending -Started on ceftriaxone. Will discharge with ceftin  Bullous pemphigoid -Ruptured bullae on back  -pending outside  records  Back pain -lumbar spine x-ray unremarkable  -Continue pain control  Depression/Anxiety -as above, continue lexapro -follow up with outpatient psychiatry  Consultants Neurology Psychiatry   Procedures  None  Discharge Exam: Vitals:   09/10/17 0522 09/10/17 0940  BP: 104/70 95/70  Pulse: 65 77  Resp: (!) 97 20  Temp: 97.9 F (36.6 C) 97.9 F (36.6 C)  SpO2: 98% 99%   Feels her leg weakness is improving and she is able to walk around more. Would like to go home. Continues to complain of "needles in her abdomen when urinating, but feels it has mildly improved." Denis chest pain, shortness of breath, abdominal pain, nausea, vomiting, diarrhea, constipation, dizziness, headache.    General: Well developed, well nourished, NAD, appears stated age  3: NCAT, mucous membranes moist.  Cardiovascular: S1 S2 auscultated, no murmurs, RRR  Respiratory: Clear to auscultation bilaterally with equal chest rise  Abdomen: Soft, nontender, nondistended, + bowel sounds  Extremities: warm dry without cyanosis clubbing or edema  Neuro: AAOx3, no new deficits, moves all extremities with ease  Psych: Anxious  Discharge Instructions Discharge Instructions    Ambulatory referral to Physical Therapy   Complete by:  As directed    Discharge instructions   Complete by:  As directed    Patient will be discharged to home with outpatient physical therapy.  Patient will need to follow up with primary care provider within one week of discharge.  Follow up psychiatry. Patient should continue medications as prescribed.  Patient should follow a regular diet.     Allergies as of 09/10/2017      Reactions   Zofran [ondansetron Hcl] Hives, Other (See Comments)   Spots around iv site   Droperidol Palpitations   Flexeril [cyclobenzaprine] Anxiety   Morphine And Related Itching   Tessalon [benzonatate] Other (See Comments)   Watery eyes      Medication List    TAKE these  medications   cefUROXime 500 MG tablet Commonly known as:  CEFTIN Take 1 tablet (500 mg total) by mouth 2 (two) times daily with a meal.   escitalopram 5 MG tablet Commonly known as:  LEXAPRO Take 1 tablet (5 mg total) by mouth daily.   gabapentin 300 MG capsule Commonly known as:  NEURONTIN Take 2 capsules (600 mg total) by mouth 3 (three) times daily.   phenazopyridine 100 MG tablet Commonly known as:  PYRIDIUM Take 1 tablet (100 mg total) by mouth 3 (three) times daily with meals.      Allergies  Allergen Reactions  . Zofran [Ondansetron Hcl] Hives and Other (See Comments)    Spots around iv site  . Droperidol Palpitations  . Flexeril [Cyclobenzaprine] Anxiety  . Morphine And Related Itching  . Tessalon [Benzonatate] Other (See Comments)    Watery eyes   Follow-up Information    Scot Jun, FNP. Schedule an appointment as soon as possible for a visit in 1 week(s).   Specialty:  Family Medicine Why:  Hospital follow up Contact information: [place] [address] [phone]            The results of significant diagnostics from this hospitalization (including imaging, microbiology, ancillary and laboratory) are listed  below for reference.    Significant Diagnostic Studies: Dg Lumbar Spine 2-3 Views  Result Date: 09/07/2017 CLINICAL DATA:  51 year old female with back pain. No reported injury. Initial encounter. EXAM: LUMBAR SPINE - 2-3 VIEW COMPARISON:  09/06/2017 MR. FINDINGS: There is no evidence of lumbar spine fracture. Alignment is normal. Intervertebral disc spaces are maintained. IMPRESSION: Negative. Electronically Signed   By: Genia Del M.D.   On: 09/07/2017 14:51   Mr Thoracic Spine Wo Contrast  Result Date: 09/06/2017 CLINICAL DATA:  Hemiplegia beginning Christmas morning. History of animal bite. EXAM: MRI THORACIC SPINE WITHOUT CONTRAST TECHNIQUE: Multiplanar, multisequence MR imaging of the thoracic spine was performed.  No intravenous contrast was administered. COMPARISON:  MRI of the lumbar spine with contrast September 06, 2017 at 0408 hours. CT abdomen and pelvis November 03, 2013. FINDINGS: ALIGNMENT: Maintenance of the thoracic kyphosis. No malalignment. VERTEBRAE/DISCS: Vertebral bodies are intact. Intervertebral discs morphology and signal are normal. CORD: Thoracic spinal cord is normal morphology and signal characteristics to the level of the conus medullaris which terminates at T12-L1. PREVERTEBRAL AND PARASPINAL SOFT TISSUES: Multiple T2 bright masses in the liver measuring to 15 mm similar to prior CT which classified lesions as cysts and hemangiomas. DISC LEVELS (moderately motion degraded axial T2 gradient: No disc bulge, canal stenosis nor neural foraminal narrowing. IMPRESSION: Negative motion degraded noncontrast MRI of the thoracic spine. Electronically Signed   By: Elon Alas M.D.   On: 09/06/2017 22:07   Mr Lumbar Spine W Wo Contrast  Result Date: 09/06/2017 CLINICAL DATA:  51 y/o F; back pain with inability to move lower extremities. EXAM: MRI LUMBAR SPINE WITHOUT AND WITH CONTRAST TECHNIQUE: Multiplanar and multiecho pulse sequences of the lumbar spine were obtained without and with intravenous contrast. CONTRAST:  39m MULTIHANCE GADOBENATE DIMEGLUMINE 529 MG/ML IV SOLN COMPARISON:  None. FINDINGS: Segmentation:  Standard. Alignment:  Physiologic. Vertebrae: Right L5 vertebral body T1/T2 hyperintense focus, likely hemangioma. No fracture, evidence of discitis, or bone lesion. Conus medullaris and cauda equina: Conus extends to the L1 level. Conus and cauda equina appear normal. No abnormal enhancement. Paraspinal and other soft tissues: Negative. Disc levels: L1-2: No significant disc displacement, foraminal stenosis, or canal stenosis. L2-3: No significant disc displacement, foraminal stenosis, or canal stenosis. L3-4: Small disc bulge. No significant foraminal or canal stenosis. L4-5: Small disc  bulge.  No significant foraminal or canal stenosis. L5-S1: No significant disc displacement, foraminal stenosis, or canal stenosis. IMPRESSION: 1. No acute osseous abnormality. No abnormal signal or enhancement of the visible conus medullaris or cauda equina. 2. Mild lumbar spondylosis at L3-4 and L4-5 levels. No significant foraminal or canal stenosis. Electronically Signed   By: LKristine GarbeM.D.   On: 09/06/2017 05:17    Microbiology: Recent Results (from the past 240 hour(s))  Blood culture (routine x 2)     Status: None (Preliminary result)   Collection Time: 09/06/17 12:04 AM  Result Value Ref Range Status   Specimen Description BLOOD RIGHT HAND  Final   Special Requests   Final    BOTTLES DRAWN AEROBIC AND ANAEROBIC Blood Culture adequate volume   Culture   Final    NO GROWTH 3 DAYS Performed at [facility] [address], [place] [address]   Report Status PENDING  Incomplete  Blood culture (routine x 2)     Status: None (Preliminary result)   Collection Time: 09/06/17 12:47 AM  Result Value Ref Range Status   Specimen Description BLOOD  LEFT ANTECUBITAL  Final   Special Requests   Final    BOTTLES DRAWN AEROBIC AND ANAEROBIC Blood Culture adequate volume   Culture   Final    NO GROWTH 3 DAYS Performed at [facility], [address], [place], [place] [address]    Report Status PENDING  Incomplete     Labs: Basic Metabolic Panel: Recent Labs  Lab 09/05/17 1115 09/06/17 0004 09/07/17 0315 09/09/17 0416  NA 142 139 139 141  K 4.1 3.7 4.5 4.3  CL 108 109 109 104  CO2  --  '24 24 29  '$ GLUCOSE 84 102* 85 88  BUN 23* 28* 24* 16  CREATININE 1.00 0.91 0.82 0.82  CALCIUM  --  8.9 8.8* 9.1   Liver Function Tests: Recent Labs  Lab 09/06/17 0004  AST 22  ALT 17  ALKPHOS 90  BILITOT 0.4  PROT 6.8  ALBUMIN 3.9   No results for input(s): LIPASE, AMYLASE in the last 168 hours. No results for input(s): AMMONIA in the last 168  hours. CBC: Recent Labs  Lab 09/05/17 1115 09/06/17 0004 09/07/17 0315 09/09/17 0416  WBC  --  5.7 5.0 4.0  NEUTROABS  --  2.7  --   --   HGB 12.6 11.7* 10.7* 11.2*  HCT 37.0 35.3* 32.4* 34.6*  MCV  --  96.4 96.4 96.4  PLT  --  253 231 222   Cardiac Enzymes: No results for input(s): CKTOTAL, CKMB, CKMBINDEX, TROPONINI in the last 168 hours. BNP: BNP (last 3 results) No results for input(s): BNP in the last 8760 hours.  ProBNP (last 3 results) No results for input(s): PROBNP in the last 8760 hours.  CBG: No results for input(s): GLUCAP in the last 168 hours.     Signed:  Cristal Ford  [facility] 09/10/2017, 10:09 AM

## 2017-09-10 NOTE — Progress Notes (Signed)
Patient ready for discharge to home; Rx's given; discharge instructions given and reviewed; patient discharged out via wheelchair; accompanied home by a family member.

## 2017-09-10 NOTE — Care Management Note (Signed)
Case Management Note  Patient Details  Name: Claybon JabsDana L Shaft MRN: [id number] Date of Birth: 09/19/1965  Subjective/Objective:      Pt presented for BLE weakness and now has UTI.    Weakness resolved after psychiatry consult  And started on antidepressant.  Pt states she has no money for prescriptions and she is uninsured.           Action/Plan: MATCH letter given.  Prescriptions printed.  OP PT referral placed by MD. Explained to pt.  Pt has transportation home.    Expected Discharge Date:  09/10/17               Expected Discharge Plan:  Home/Self Care  In-House Referral:  Clinical Social Work  Discharge planning Services  CM Consult, MATCH Program  Post Acute Care Choice:  NA Choice offered to:  NA  DME Arranged:   n/a DME Agency:     HH Arranged:    HH Agency:     Status of Service:  Completed, signed off  If discussed at [employer] Meetings, dates discussed:    Additional Comments:  Verdene LennertGoldean, Jerin Franzel K, RN 09/10/2017, 11:02 AM

## 2017-09-10 NOTE — Discharge Instructions (Signed)

## 2017-09-11 LAB — CULTURE, BLOOD (ROUTINE X 2)
Culture: NO GROWTH
Culture: NO GROWTH
Special Requests: ADEQUATE
Special Requests: ADEQUATE

## 2017-09-12 ENCOUNTER — Telehealth: Payer: Self-pay

## 2017-09-13 NOTE — Telephone Encounter (Signed)
The patient will need to schedule a hospital follow-up visit. Please schedule her for an appointment either tomorrow or Monday

## 2017-09-14 MED FILL — ESCITALOPRAM 10 MG TABLET: 10 | 60 days supply | Qty: 60 | Fill #0

## 2017-09-19 MED FILL — GABAPENTIN 300 MG CAPS: 300 | 30 days supply | Qty: 180 | Fill #0

## 2017-10-10 ENCOUNTER — Telehealth: Payer: Self-pay | Admitting: Emergency Medicine

## 2017-10-10 NOTE — Telephone Encounter (Signed)
CM advised [facility] with concerns over the [facility] and that she didn't have ins.  CM called pt back and spoke with her about continuing to try to make an appointment with her PCP and to voice her concerns about why she needs a sooner appointment.  Pt is currently scheduled in April for her next follow up.  CM additionally [facility] for financial counseling and for pharmacy needs.  Pt acknowledged understanding and stated she would call both offices now.  No further CM needs noted at this time.

## 2017-10-16 ENCOUNTER — Ambulatory Visit (INDEPENDENT_AMBULATORY_CARE_PROVIDER_SITE_OTHER): Payer: Self-pay | Admitting: Psychiatry

## 2017-10-16 ENCOUNTER — Encounter (HOSPITAL_COMMUNITY): Payer: Self-pay | Admitting: Psychiatry

## 2017-10-16 DIAGNOSIS — F419 Anxiety disorder, unspecified: Secondary | ICD-10-CM

## 2017-10-16 DIAGNOSIS — R450 Nervousness: Secondary | ICD-10-CM

## 2017-10-16 DIAGNOSIS — Z810 Family history of intellectual disabilities: Secondary | ICD-10-CM

## 2017-10-16 DIAGNOSIS — Z818 Family history of other mental and behavioral disorders: Secondary | ICD-10-CM

## 2017-10-16 DIAGNOSIS — F410 Panic disorder [episodic paroxysmal anxiety] without agoraphobia: Secondary | ICD-10-CM

## 2017-10-16 DIAGNOSIS — Z62810 Personal history of physical and sexual abuse in childhood: Secondary | ICD-10-CM

## 2017-10-16 DIAGNOSIS — F332 Major depressive disorder, recurrent severe without psychotic features: Secondary | ICD-10-CM

## 2017-10-16 DIAGNOSIS — M2550 Pain in unspecified joint: Secondary | ICD-10-CM

## 2017-10-16 DIAGNOSIS — G4700 Insomnia, unspecified: Secondary | ICD-10-CM

## 2017-10-16 MED ORDER — ESCITALOPRAM OXALATE 20 MG PO TABS: 20.0000 mg | ORAL_TABLET | Freq: Every day | ORAL | 1 refills | Status: DC

## 2017-10-16 MED ORDER — QUETIAPINE FUMARATE 50 MG PO TABS: 50.0000 mg | ORAL_TABLET | Freq: Every day | ORAL | 1 refills | Status: DC

## 2017-10-16 MED ORDER — QUETIAPINE FUMARATE 50 MG PO TABS: 50.0000 mg | ORAL_TABLET | Freq: Every day | ORAL | 2 refills | Status: DC

## 2017-10-16 MED FILL — GABAPENTIN 300 MG CAPS: 300 | 30 days supply | Qty: 180 | Fill #1

## 2017-10-16 MED FILL — QUETIAPINE FUMARATE 50 MG T: 50 | 30 days supply | Qty: 30 | Fill #0
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
## 2017-10-31 ENCOUNTER — Telehealth (HOSPITAL_COMMUNITY): Payer: Self-pay

## 2017-10-31 MED FILL — ESCITALOPRAM 20 MG TABLET: 20 | 90 days supply | Qty: 90 | Fill #0

## 2017-10-31 NOTE — Telephone Encounter (Signed)
Patient is still experiencing anxiety and panic attacks even after starting Lexapro. [facility], but it has not gone through yet. Patient said you could call and she can explain what is going on. Her call back number is [phone]

## 2017-11-01 NOTE — Telephone Encounter (Signed)
I met her 2 weeks ago and increased lexapro - she can expect this to take 6-8 weeks. No further changes to offer at this time

## 2017-11-02 ENCOUNTER — Encounter (HOSPITAL_COMMUNITY): Payer: Self-pay | Admitting: Psychiatry

## 2017-11-02 ENCOUNTER — Ambulatory Visit (INDEPENDENT_AMBULATORY_CARE_PROVIDER_SITE_OTHER): Payer: Self-pay | Admitting: Psychiatry

## 2017-11-02 DIAGNOSIS — F332 Major depressive disorder, recurrent severe without psychotic features: Secondary | ICD-10-CM

## 2017-11-02 DIAGNOSIS — Z818 Family history of other mental and behavioral disorders: Secondary | ICD-10-CM

## 2017-11-02 DIAGNOSIS — Z79899 Other long term (current) drug therapy: Secondary | ICD-10-CM

## 2017-11-02 DIAGNOSIS — F410 Panic disorder [episodic paroxysmal anxiety] without agoraphobia: Secondary | ICD-10-CM

## 2017-11-02 MED ORDER — CLONIDINE HCL 0.1 MG PO TABS: 0.1000 mg | ORAL_TABLET | Freq: Two times a day (BID) | ORAL | 1 refills | Status: DC

## 2017-11-02 MED ORDER — QUETIAPINE FUMARATE 100 MG PO TABS: 100.0000 mg | ORAL_TABLET | Freq: Every day | ORAL | 2 refills | Status: DC

## 2017-11-02 MED FILL — CloNIDine HCL 0.1 MG TAB: 0.1 | 30 days supply | Qty: 60 | Fill #0

## 2017-11-02 MED FILL — QUETIAPINE FUMARATE 100 MG: 100 | 30 days supply | Qty: 30 | Fill #0

## 2017-11-02 NOTE — Patient Instructions (Signed)
Call [phone]

## 2017-11-02 NOTE — Progress Notes (Signed)
BH MD/PA/NP OP Progress Note  11/02/2017 1:12 PM Sabrina [name]  MRN:  [id number]  Chief Complaint: anxiety, panic  HPI: Sabrina [name] presents for sooner visit. Reports that her anxiety has not improved since we increased Lexapro 2 weeks ago.  She does not feel things are worse, but feels uncomfortable with her ongoing anxiety, restlessness, and often feels a sense of doom.  We discussed switching to Paxil for a more immediately calming regimen, but discussed the risks of side effects including dry mouth, sedation, weight gain.  She was opposed to this, we discussed augmenting her current regimen with alpha modulator, clonidine, to help with more immediate calming sensation given her trauma related anxiety.  She was agreeable to this and we reviewed the risks of low blood pressure, headache, sedation.  She was agreeable to continue Seroquel at an increased dose of 100 mg nightly for sleep and ongoing augmentation of Lexapro.  Visit Diagnosis: No diagnosis found.  Past Psychiatric History: See intake H&P for full details. Reviewed, with no updates at this time.   Past Medical History:  Past Medical History:  Diagnosis Date  . Arthritis    "knees" (09/06/2017)  . Bullous pemphigus   . Cat bite 06/2014   to left elbow  . History of blood transfusion 1988   "when I had my baby"  . Muscle weakness of lower extremity 2001; 09/05/2017   "resolved after a couple weeks; ?" (09/06/2017)  . 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 Poisoning, snake bite 04/08/2016   "[place]; RUE"  . PONV (postoperative nausea and vomiting)   . Situational anxiety   . Staph infection ~ 2015   "left elbow and finger"    Past Surgical History:  Procedure Laterality Date  . APPENDECTOMY  ~ 1987  . APPLICATION OF A-CELL OF EXTREMITY Left 08/05/2015   Procedure: APPLICATION OF A-CELL OF EXTREMITY;  Surgeon: Peggye Form, DO;  Location: [facility];  Service: Plastics;  Laterality: Left;  . BREAST  SURGERY Right 1990   "milk duct taken out"  . DEBRIDEMENT AND CLOSURE WOUND Left 07/01/2015   Procedure: LEFT ELBOW EXCISION OF WOUND WITH PRIMARY CLOSURE 2X5 CM ;  Surgeon: Peggye Form, DO;  Location: [facility];  Service: Plastics;  Laterality: Left;  . ELBOW SURGERY Left X 23 in [place] <06/2015   from a cat bite; all I&D  . I&D EXTREMITY Left 07/08/2015   Procedure: IRRIGATION AND DEBRIDEMENT EXTREMITY, DRAINAGE OF LEFT ARM WOUND, A-CELL PLACEMENT, WOUND VAC PLACEMENT;  Surgeon: Alena Bills Dillingham, DO;  Location: [facility];  Service: Plastics;  Laterality: Left;  . INCISION AND DRAINAGE OF WOUND Left 08/05/2015   Procedure: IRRIGATION AND DEBRIDEMENT LEFT ELBOW WOUND, PLACEMENT OF ACELL;  Surgeon: Peggye Form, DO;  Location: [facility];  Service: Plastics;  Laterality: Left;  . LAPAROSCOPIC CHOLECYSTECTOMY  1998  . SKIN GRAFT Left 2016   took from anterior thigh; placed at elbow  . TONSILLECTOMY  ~ 2000  . TOTAL ABDOMINAL HYSTERECTOMY  2003    Family Psychiatric History: See intake H&P for full details. Reviewed, with no updates at this time.   Family History:  Family History  Problem Relation Age of Onset  . Liver disease Mother   . Dementia Mother   . Cancer Mother   . Cancer Maternal Grandmother     Social History:  Social History   Socioeconomic History  . Marital status: Divorced    Spouse  name: None  . Number of children: None  . Years of education: None  . Highest education level: None  Social Needs  . Financial resource strain: None  . Food insecurity - worry: None  . Food insecurity - inability: None  . Transportation needs - medical: None  . Transportation needs - non-medical: None  Occupational History  . None  Tobacco Use  . Smoking status: Never Smoker  . Smokeless tobacco: Never Used  Substance and Sexual Activity  . Alcohol use: No    Alcohol/week: 0.0 oz    Comment: 09/06/2017 "I'll have a glass of  wine a couple times/year; maybe"  . Drug use: No  . Sexual activity: Not Currently  Other Topics Concern  . None  Social History Narrative  . None    Allergies:  Allergies  Allergen Reactions  . Zofran [Ondansetron Hcl] Hives and Other (See Comments)    Spots around iv site  . Droperidol Palpitations  . Flexeril [Cyclobenzaprine] Anxiety  . Morphine And Related Itching  . Tessalon [Benzonatate] Other (See Comments)    Watery eyes    Metabolic Disorder Labs: Lab Results  Component Value Date   HGBA1C 5.2 02/28/2017   MPG  07/05/2015    QUESTIONABLE IDENTIFICATION / INCORRECTLY LABELED SPECIMEN   No results found for: PROLACTIN Lab Results  Component Value Date   CHOL 237 (H) 04/27/2015   TRIG 144 04/27/2015   HDL 70 04/27/2015   CHOLHDL 3.4 04/27/2015   VLDL 29 04/27/2015   LDLCALC 138 (H) 04/27/2015   Lab Results  Component Value Date   TSH  07/05/2015    QUESTIONABLE IDENTIFICATION / INCORRECTLY LABELED SPECIMEN   TSH 3.540 04/27/2015    Therapeutic Level Labs: No results found for: LITHIUM No results found for: VALPROATE No components found for:  CBMZ  Current Medications: Current Outpatient Medications  Medication Sig Dispense Refill  . cefUROXime (CEFTIN) 500 MG tablet Take 1 tablet (500 mg total) by mouth 2 (two) times daily with a meal. 12 tablet 0  . escitalopram (LEXAPRO) 20 MG tablet Take 1 tablet (20 mg total) by mouth daily. 90 tablet 1  . gabapentin (NEURONTIN) 300 MG capsule Take 2 capsules (600 mg total) by mouth 3 (three) times daily. 180 capsule 3  . phenazopyridine (PYRIDIUM) 100 MG tablet Take 1 tablet (100 mg total) by mouth 3 (three) times daily with meals. 15 tablet 0  . QUEtiapine (SEROQUEL) 50 MG tablet Take 1 tablet (50 mg total) by mouth at bedtime. 30 tablet 2   No current facility-administered medications for this visit.     Musculoskeletal: Strength & Muscle Tone: within normal limits Gait & Station: normal Patient leans:  N/A  Psychiatric Specialty Exam: ROS  Blood pressure 104/70, pulse 82, height 5\' 4"  (1.626 m), weight 190 lb 9.6 oz (86.5 kg).Body mass index is 32.72 kg/m.  General Appearance: Casual and Fairly Groomed  Eye Contact:  Fair  Speech:  Clear and Coherent and Normal Rate  Volume:  Normal  Mood:  Anxious and Dysphoric  Affect:  Appropriate and Congruent  Thought Process:  Goal Directed and Descriptions of Associations: Intact  Orientation:  Full (Time, Place, and Person)  Thought Content: Logical   Suicidal Thoughts:  No  Homicidal Thoughts:  No  Memory:  Immediate;   Fair  Judgement:  Fair  Insight:  Shallow  Psychomotor Activity:  Normal  Concentration:  Concentration: Fair  Recall:  [employer]  Fund of Knowledge: Fair  Language: Good  Akathisia:  Negative  Handed:  Right  AIMS (if indicated): not done  Assets:  Communication Skills Desire for Improvement Transportation  ADL's:  Intact  Cognition: WNL  Sleep:  Fair   Screenings: PHQ2-9     Office Visit from 08/28/2017 in [facility] Office Visit from 05/22/2017 in [facility] Office Visit from 04/21/2017 in [facility] Office Visit from 04/05/2017 in [facility] Office Visit from 02/28/2017 in [facility]  PHQ-2 Total Score  0  0  3  0  0  PHQ-9 Total Score  No data  No data  11  No data  No data       Assessment and Plan:  CATHRYNE MANCEBO presents for sooner follow-up for anxiety.  She continues to feel restless, irritable, and "uncomfortable in my own skin".  She reports that the Seroquel has been helpful for sleep, and wonders about an increase to get a more consistent sleep.  She denies any significant side effects from Seroquel and Lexapro, but has yet to see benefit in terms of anxiety reduction.  We discussed augmenting with clonidine as below and we will follow-up in 8 weeks as scheduled.  She does not have any acute suicidality  or substance abuse contributing to the presentation at this time.  1. Panic disorder   2. Severe episode of recurrent major depressive disorder, without psychotic features (HCC)     Status of current problems: unchanged  Labs Ordered: No orders of the defined types were placed in this encounter.   Labs Reviewed: n/a  Collateral Obtained/Records Reviewed: n/a  Plan:  lexapro 20 mg daily seroquel 100 mg nightly Clonidine 0.1 mg BID for anxiety, restlessness RTC 8 weeks Therapy intake scheduled  I spent 20 minutes with the patient in direct face-to-face clinical care.  Greater than 50% of this time was spent in counseling and coordination of care with the patient.    Burnard Leigh, MD 11/02/2017, 1:12 PM

## 2017-11-09 ENCOUNTER — Encounter (HOSPITAL_COMMUNITY): Payer: Self-pay | Admitting: Psychiatry

## 2017-11-10 MED FILL — GABAPENTIN 300 MG CAPS: 300 | 30 days supply | Qty: 180 | Fill #2

## 2017-11-23 MED FILL — CloNIDine HCL 0.1 MG TAB: 0.1 | 30 days supply | Qty: 60 | Fill #1

## 2017-12-01 ENCOUNTER — Encounter (HOSPITAL_COMMUNITY): Payer: Self-pay | Admitting: Psychiatry

## 2017-12-02 ENCOUNTER — Other Ambulatory Visit (HOSPITAL_COMMUNITY): Payer: Self-pay

## 2017-12-02 ENCOUNTER — Telehealth (HOSPITAL_COMMUNITY): Payer: Self-pay

## 2017-12-02 DIAGNOSIS — F332 Major depressive disorder, recurrent severe without psychotic features: Secondary | ICD-10-CM

## 2017-12-02 DIAGNOSIS — F410 Panic disorder [episodic paroxysmal anxiety] without agoraphobia: Secondary | ICD-10-CM

## 2017-12-02 NOTE — Telephone Encounter (Signed)
Fax received to change patients prescription of Clonidine from BID to TID. I saw the message where she informed you she was taking three a day, however I was not sure if you wanted me to send in a new rx. Please review and advise, thank you

## 2017-12-04 MED ORDER — CLONIDINE HCL 0.1 MG PO TABS
0.1000 mg | ORAL_TABLET | Freq: Three times a day (TID) | ORAL | 0 refills | Status: DC
Start: 2017-12-04 — End: 2018-02-09

## 2017-12-04 MED FILL — CloNIDine HCL 0.1 MG TAB: 0.1 | 30 days supply | Qty: 90 | Fill #0

## 2017-12-04 MED FILL — QUETIAPINE FUMARATE 100 MG: 100 | 30 days supply | Qty: 30 | Fill #1

## 2017-12-04 NOTE — Telephone Encounter (Signed)
Yes that is fine to send TID dosing

## 2017-12-04 NOTE — Telephone Encounter (Signed)
Okay, new prescription sent 

## 2017-12-06 ENCOUNTER — Ambulatory Visit (HOSPITAL_COMMUNITY): Payer: Self-pay | Admitting: Psychiatry

## 2017-12-11 ENCOUNTER — Ambulatory Visit: Payer: Self-pay | Admitting: Family Medicine

## 2017-12-11 MED FILL — GABAPENTIN 300 MG CAPSULE: 300 | 30 days supply | Qty: 180 | Fill #3

## 2017-12-14 ENCOUNTER — Ambulatory Visit (HOSPITAL_COMMUNITY): Payer: Self-pay | Admitting: Psychiatry

## 2017-12-18 ENCOUNTER — Ambulatory Visit: Payer: Self-pay | Admitting: Family Medicine

## 2018-01-01 ENCOUNTER — Other Ambulatory Visit: Payer: Self-pay | Admitting: Family Medicine

## 2018-01-01 ENCOUNTER — Other Ambulatory Visit (HOSPITAL_COMMUNITY): Payer: Self-pay | Admitting: Psychiatry

## 2018-01-01 DIAGNOSIS — G609 Hereditary and idiopathic neuropathy, unspecified: Secondary | ICD-10-CM

## 2018-01-01 DIAGNOSIS — F410 Panic disorder [episodic paroxysmal anxiety] without agoraphobia: Secondary | ICD-10-CM

## 2018-01-01 DIAGNOSIS — F332 Major depressive disorder, recurrent severe without psychotic features: Secondary | ICD-10-CM

## 2018-01-08 ENCOUNTER — Encounter (HOSPITAL_COMMUNITY): Payer: Self-pay | Admitting: Emergency Medicine

## 2018-01-08 ENCOUNTER — Emergency Department (HOSPITAL_COMMUNITY)
Admission: EM | Admit: 2018-01-08 | Discharge: 2018-01-08 | Disposition: A | Discharge status: Home / Self Care | Payer: Self-pay | Attending: Emergency Medicine | Admitting: Emergency Medicine

## 2018-01-08 DIAGNOSIS — Z79899 Other long term (current) drug therapy: Secondary | ICD-10-CM | POA: Insufficient documentation

## 2018-01-08 DIAGNOSIS — S61501A Unspecified open wound of right wrist, initial encounter: Secondary | ICD-10-CM | POA: Insufficient documentation

## 2018-01-08 DIAGNOSIS — Y929 Unspecified place or not applicable: Secondary | ICD-10-CM | POA: Insufficient documentation

## 2018-01-08 DIAGNOSIS — Y939 Activity, unspecified: Secondary | ICD-10-CM | POA: Insufficient documentation

## 2018-01-08 DIAGNOSIS — Y999 Unspecified external cause status: Secondary | ICD-10-CM | POA: Insufficient documentation

## 2018-01-08 DIAGNOSIS — M25531 Pain in right wrist: Secondary | ICD-10-CM

## 2018-01-08 DIAGNOSIS — X58XXXA Exposure to other specified factors, initial encounter: Secondary | ICD-10-CM | POA: Insufficient documentation

## 2018-01-08 MED ORDER — DOXYCYCLINE HYCLATE 100 MG PO CAPS: 100.0000 mg | ORAL_CAPSULE | Freq: Two times a day (BID) | ORAL | 0 refills | Status: DC

## 2018-01-08 MED FILL — GABAPENTIN 300 MG CAPSULE: 300 | 30 days supply | Qty: 180 | Fill #0

## 2018-01-08 MED FILL — QUETIAPINE FUMARATE 100 MG: 100 | 30 days supply | Qty: 30 | Fill #2

## 2018-01-08 NOTE — Discharge Instructions (Signed)
Keep wound clean and dry. Apply warm compresses to affected area throughout the day. Take antibiotic until it is finished. Take your home pain medication as directed, as needed for pain but do not drive or operate machinery with pain medication use. Alternate between tylenol and ibuprofen as needed for pain relief as well. Follow-up with [facility]Primary Care doctor in 3-5 days for wound recheck and packing removal. Monitor area for signs of infection to include, but not limited to: increasing pain, spreading redness, drainage/pus, worsening swelling, or fevers. Return to emergency department for emergent changing or worsening symptoms.

## 2018-01-08 NOTE — ED Triage Notes (Signed)
Patient here from home with complaints of abscess to right wrist since Thursday. Reports hx of same. Covered with bandage.

## 2018-01-08 NOTE — ED Provider Notes (Signed)
[facility]-EMERGENCY DEPT Provider Note   CSN: [id number] Arrival date & time: 01/08/18  1039     History   Chief Complaint Chief Complaint  Patient presents with  . Abscess    HPI Sabrina [name] is a 52 y.o. female with a PMHx of bullous pemphigus and remote osteomyelitis of the L elbow s/p I&D and surgical intervention, as well as other conditions listed below, who presents to the ED with complaints of right wrist pain and a wound which started 4 days ago.  Patient states that she had a piece of metal going to her wrist last year in May, she is had issues with right wrist pain and swelling since then, but she had not had any new injuries or wounds to the wrist recently.  She denies IV drug use, or insect bites.  She states that 4 days ago she started having a small area of swelling on the wrist and then "a pimple" popped up and when it erupted there was a scant amount of drainage.  She states that once it drains the swelling goes down, but then it recollects and the swelling returns.  She is also had some erythema and a bruise around the area.  Additionally she mentions that she has had some warmth in the area, as well as pain which she describes a 7/10 constant soreness and sharp nonradiating right wrist pain that worsens with movement of the wrist and has been unrelieved with Tylenol and tramadol.  This morning she had a fever of 100.7, took ibuprofen which resolved the fever.  She denies any chills, red streaking, CP, SOB, abd pain, N/V/D/C, hematuria, dysuria, numbness, tingling, focal weakness, or any other complaints at this time. She is not on anything for her bullous pemphigus at this time, but states it "flares up" during times of stress.   The history is provided by the patient and medical records. No language interpreter was used.  Abscess  Associated symptoms: fever (100.7)   Associated symptoms: no nausea and no vomiting     Past Medical History:    Diagnosis Date  . Arthritis    "knees" (09/06/2017)  . Bullous pemphigus   . Cat bite 06/2014   to left elbow  . History of blood transfusion 1988   "when I had my baby"  . Muscle weakness of lower extremity 2001; 09/05/2017   "resolved after a couple weeks; ?" (09/06/2017)  . 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 Poisoning, snake bite 04/08/2016   "[place]; RUE"  . PONV (postoperative nausea and vomiting)   . Situational anxiety   . Staph infection ~ 2015   "left elbow and finger"    Patient Active Problem List   Diagnosis Date Noted  . Weakness 09/06/2017  . Depression 09/06/2017  . 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  . Generalized anxiety disorder with panic attacks 09/06/2017  . Right hand pain   . Wrist swelling   . Paresthesia   . Abscess of finger   . Cellulitis of right upper extremity 04/11/2016  . Cellulitis 04/11/2016  . Snake bite   . Medical non-compliance 10/05/2015  . Elbow pain 07/20/2015  . Anxiety 07/20/2015  . Wound infection 07/04/2015  . Dehydration 07/04/2015  . Tachycardia 07/04/2015  . Diarrhea 07/04/2015  . Wound infection after surgery 07/04/2015  . 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HCC)   . Cat bite of forearm 07/01/2015  . Skin ulcer of upper arm, limited to breakdown of skin (HCC) 07/01/2015  .  Cellulitis of upper arm 03/10/2015    Past Surgical History:  Procedure Laterality Date  . APPENDECTOMY  ~ 1987  . APPLICATION OF A-CELL OF EXTREMITY Left 08/05/2015   Procedure: APPLICATION OF A-CELL OF EXTREMITY;  Surgeon: Peggye Form, DO;  Location: [facility];  Service: Plastics;  Laterality: Left;  . BREAST SURGERY Right 1990   "milk duct taken out"  . DEBRIDEMENT AND CLOSURE WOUND Left 07/01/2015   Procedure: LEFT ELBOW EXCISION OF WOUND WITH PRIMARY CLOSURE 2X5 CM ;  Surgeon: Peggye Form, DO;  Location: [facility];  Service: Plastics;  Laterality: Left;  . ELBOW SURGERY Left X 23 in [place] <06/2015   from  a cat bite; all I&D  . I&D EXTREMITY Left 07/08/2015   Procedure: IRRIGATION AND DEBRIDEMENT EXTREMITY, DRAINAGE OF LEFT ARM WOUND, A-CELL PLACEMENT, WOUND VAC PLACEMENT;  Surgeon: Alena Bills Dillingham, DO;  Location: [facility];  Service: Plastics;  Laterality: Left;  . INCISION AND DRAINAGE OF WOUND Left 08/05/2015   Procedure: IRRIGATION AND DEBRIDEMENT LEFT ELBOW WOUND, PLACEMENT OF ACELL;  Surgeon: Peggye Form, DO;  Location: [facility];  Service: Plastics;  Laterality: Left;  . LAPAROSCOPIC CHOLECYSTECTOMY  1998  . SKIN GRAFT Left 2016   took from anterior thigh; placed at elbow  . TONSILLECTOMY  ~ 2000  . TOTAL ABDOMINAL HYSTERECTOMY  2003     OB History   None      Home Medications    Prior to Admission medications   Medication Sig Start Date End Date Taking? Authorizing Provider  cefUROXime (CEFTIN) 500 MG tablet Take 1 tablet (500 mg total) by mouth 2 (two) times daily with a meal. 09/10/17   Mikhail, Nita Sells, DO  cloNIDine (CATAPRES) 0.1 MG tablet Take 1 tablet (0.1 mg total) by mouth 3 (three) times daily. 12/04/17 12/04/18  Burnard Leigh, MD  gabapentin (NEURONTIN) 300 MG capsule TAKE 2 CAPSULES (600 MG TOTAL) BY MOUTH 3 TIMES DAILY. 01/01/18   Quentin Angst, MD  phenazopyridine (PYRIDIUM) 100 MG tablet Take 1 tablet (100 mg total) by mouth 3 (three) times daily with meals. 09/10/17   Mikhail, Nita Sells, DO  QUEtiapine (SEROQUEL) 100 MG tablet Take 1 tablet (100 mg total) by mouth at bedtime. 11/02/17 11/02/18  Burnard Leigh, MD    Family History Family History  Problem Relation Age of Onset  . Liver disease Mother   . Dementia Mother   . Cancer Mother   . Cancer Maternal Grandmother     Social History Social History   Tobacco Use  . Smoking status: Never Smoker  . Smokeless tobacco: Never Used  Substance Use Topics  . Alcohol use: No    Alcohol/week: 0.0 oz    Comment: 09/06/2017 "I'll have a glass of wine a couple  times/year; maybe"  . Drug use: No     Allergies   Zofran [ondansetron hcl]; Droperidol; Flexeril [cyclobenzaprine]; Morphine and related; and Tessalon [benzonatate]   Review of Systems Review of Systems  Constitutional: Positive for fever (100.7). Negative for chills.  Respiratory: Negative for shortness of breath.   Cardiovascular: Negative for chest pain.  Gastrointestinal: Negative for abdominal pain, constipation, diarrhea, nausea and vomiting.  Genitourinary: Negative for dysuria and hematuria.  Musculoskeletal: Positive for arthralgias and joint swelling.  Skin: Positive for color change and wound.  Allergic/Immunologic: Negative for immunocompromised state.  Neurological: Negative for weakness and numbness.  Psychiatric/Behavioral: Negative for confusion.   All other systems reviewed  and are negative for acute change except as noted in the HPI.    Physical Exam Updated Vital Signs BP 107/86 (BP Location: Right Arm)   Pulse (!) 101   Temp 98.2 F (36.8 C) (Oral)   Resp 17   SpO2 100%   Physical Exam  Constitutional: She is oriented to person, place, and time. Vital signs are normal. She appears well-developed and well-nourished.  Non-toxic appearance. No distress.  Afebrile, nontoxic, NAD  HENT:  Head: Normocephalic and atraumatic.  Mouth/Throat: Mucous membranes are normal.  Eyes: Conjunctivae and EOM are normal. Right eye exhibits no discharge. Left eye exhibits no discharge.  Neck: Normal range of motion. Neck supple.  Cardiovascular: Normal rate and intact distal pulses.  No tachycardia during exam  Pulmonary/Chest: Effort normal. No respiratory distress.  Abdominal: Normal appearance. She exhibits no distension.  Musculoskeletal: Normal range of motion.       Right wrist: She exhibits tenderness and laceration (abrasion). She exhibits normal range of motion, no bony tenderness, no swelling, no effusion, no crepitus and no deformity.  Small circular abrasion  type wound to ventral aspect of R wrist, FROM intact and without bony or joint line TTP, mild TTP around the wound, no fluctuance or induration, wound covered in a scab and with faint line of erythema at the wound border but no spreading erythema around the wound, small bruise just distal to the wound. No drainage noted. No red streaking. No warmth. Strength and sensation grossly intact, distal pulses intact, compartments soft. SEE PICTURE BELOW  Neurological: She is alert and oriented to person, place, and time. She has normal strength. No sensory deficit.  Skin: Skin is warm and dry. Abrasion noted. No rash noted.  See MsK exam  Psychiatric: She has a normal mood and affect. Her behavior is normal.  Nursing note and vitals reviewed.      ED Treatments / Results  Labs (all labs ordered are listed, but only abnormal results are displayed) Labs Reviewed - No data to display  EKG None  Radiology No results found.  Procedures Procedures (including critical care time)  Medications Ordered in ED Medications - No data to display   Initial Impression / Assessment and Plan / ED Course  I have reviewed the triage vital signs and the nursing notes.  Pertinent labs & imaging results that were available during my care of the patient were reviewed by me and considered in my medical decision making (see chart for details).     52 y.o. female here with small circular wound to R wrist that she states has been swelling up intermittently and having drainage then going down and then returning. On exam, appears to be a spherical abrasion type wound to the ventral aspect of the R wrist, no swelling, some mild erythema around the wound border but none spreading out; no warmth. No drainage or fluctuance, no induration. Pt has bullous pemphigoid, I suspect this may be the source of her issue; she reports fevers but is afebrile here. Will cover her empirically for possible bacterial infection, just given  her complex PMHx, but doubt need for I&D or any other intervention at this time. Advised other OTC remedies for symptomatic relief. F/up with PCP in 3-5 days for recheck. I explained the diagnosis and have given explicit precautions to return to the ER including for any other new or worsening symptoms. The patient understands and accepts the medical plan as it's been dictated and I have answered their questions. Discharge  instructions concerning home care and prescriptions have been given. The patient is STABLE and is discharged to home in good condition.    Final Clinical Impressions(s) / ED Diagnoses   Final diagnoses:  Right wrist pain  Unspecified open wound of right wrist, initial encounter    ED Discharge Orders        Ordered    doxycycline (VIBRAMYCIN) 100 MG capsule  2 times daily     01/08/18 [address], [place], [place] 01/08/18 1419    Derwood Kaplan, MD 01/09/18 [phone]

## 2018-01-24 ENCOUNTER — Ambulatory Visit (HOSPITAL_COMMUNITY): Payer: Self-pay | Admitting: Psychiatry

## 2018-01-29 MED FILL — ESCITALOPRAM 20 MG TABLET: 20 | 30 days supply | Qty: 30 | Fill #1

## 2018-02-06 MED FILL — GABAPENTIN 300 MG CAPSULE: 300 | 30 days supply | Qty: 180 | Fill #1

## 2018-02-09 ENCOUNTER — Ambulatory Visit (INDEPENDENT_AMBULATORY_CARE_PROVIDER_SITE_OTHER): Payer: Self-pay | Admitting: Psychiatry

## 2018-02-09 ENCOUNTER — Encounter (HOSPITAL_COMMUNITY): Payer: Self-pay | Admitting: Psychiatry

## 2018-02-09 DIAGNOSIS — F410 Panic disorder [episodic paroxysmal anxiety] without agoraphobia: Secondary | ICD-10-CM

## 2018-02-09 DIAGNOSIS — F332 Major depressive disorder, recurrent severe without psychotic features: Secondary | ICD-10-CM

## 2018-02-09 DIAGNOSIS — Z79899 Other long term (current) drug therapy: Secondary | ICD-10-CM

## 2018-02-09 MED ORDER — QUETIAPINE FUMARATE 200 MG PO TABS: 200.0000 mg | ORAL_TABLET | Freq: Every day | ORAL | 0 refills | Status: DC

## 2018-02-09 MED ORDER — ESCITALOPRAM OXALATE 20 MG PO TABS: 20.0000 mg | ORAL_TABLET | Freq: Every day | ORAL | 0 refills | Status: DC

## 2018-02-09 MED FILL — QUETIAPINE FUMARATE 200 MG: 200 | 30 days supply | Qty: 30 | Fill #0

## 2018-02-09 NOTE — Progress Notes (Signed)
BH MD/PA/NP OP Progress Note  02/09/2018 8:07 AM Sabrina [name]  MRN:  [id number]  Chief Complaint: anxiety HPI: Sabrina [name] reports anxiety/mood fairly stable. Has tolerated seroquel augmentation well, no increased hunger to negative side effects to report.  Continues to struggle with issues of workmans comp, had returned to work for 2 weeks but then injured her wrist again.  Spent time with patient discussing some of her concerns today with regard to difficulty sleeping still.  We agreed to increase Seroquel to 200 mg and continue Lexapro.  She has been off of clonidine for a couple months, and the dizziness that it caused seem to be not worth any of the benefits.  Patient is aware that writer is transitioning out of clinic, and her next 3-64-month follow-up will be with a different provider at this office.  We also discussed getting her engaged in individual therapy, and discussed the limitations of medications, and emphasizing and increased participation in thinking about her problems and trying to modify some of her cognitions and anxiety responses.  Visit Diagnosis:    ICD-10-CM   1. Panic disorder F41.0 QUEtiapine (SEROQUEL) 200 MG tablet    escitalopram (LEXAPRO) 20 MG tablet  2. Severe episode of recurrent major depressive disorder, without psychotic features (HCC) F33.2 QUEtiapine (SEROQUEL) 200 MG tablet    escitalopram (LEXAPRO) 20 MG tablet    Past Psychiatric History: See intake H&P for full details. Reviewed, with no updates at this time.   Past Medical History:  Past Medical History:  Diagnosis Date  . Arthritis    "knees" (09/06/2017)  . Bullous pemphigus   . Cat bite 06/2014   to left elbow  . History of blood transfusion 1988   "when I had my baby"  . Muscle weakness of lower extremity 2001; 09/05/2017   "resolved after a couple weeks; ?" (09/06/2017)  . 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 Poisoning, snake bite 04/08/2016   "[place]; RUE"  . PONV (postoperative  nausea and vomiting)   . Situational anxiety   . Staph infection ~ 2015   "left elbow and finger"    Past Surgical History:  Procedure Laterality Date  . APPENDECTOMY  ~ 1987  . APPLICATION OF A-CELL OF EXTREMITY Left 08/05/2015   Procedure: APPLICATION OF A-CELL OF EXTREMITY;  Surgeon: Peggye Form, DO;  Location: [facility];  Service: Plastics;  Laterality: Left;  . BREAST SURGERY Right 1990   "milk duct taken out"  . DEBRIDEMENT AND CLOSURE WOUND Left 07/01/2015   Procedure: LEFT ELBOW EXCISION OF WOUND WITH PRIMARY CLOSURE 2X5 CM ;  Surgeon: Peggye Form, DO;  Location: [facility];  Service: Plastics;  Laterality: Left;  . ELBOW SURGERY Left X 23 in [place] <06/2015   from a cat bite; all I&D  . I&D EXTREMITY Left 07/08/2015   Procedure: IRRIGATION AND DEBRIDEMENT EXTREMITY, DRAINAGE OF LEFT ARM WOUND, A-CELL PLACEMENT, WOUND VAC PLACEMENT;  Surgeon: Alena Bills Dillingham, DO;  Location: [facility];  Service: Plastics;  Laterality: Left;  . INCISION AND DRAINAGE OF WOUND Left 08/05/2015   Procedure: IRRIGATION AND DEBRIDEMENT LEFT ELBOW WOUND, PLACEMENT OF ACELL;  Surgeon: Peggye Form, DO;  Location: [facility];  Service: Plastics;  Laterality: Left;  . LAPAROSCOPIC CHOLECYSTECTOMY  1998  . SKIN GRAFT Left 2016   took from anterior thigh; placed at elbow  . TONSILLECTOMY  ~ 2000  . TOTAL ABDOMINAL HYSTERECTOMY  2003    Family  Psychiatric History: See intake H&P for full details. Reviewed, with no updates at this time.   Family History:  Family History  Problem Relation Age of Onset  . Liver disease Mother   . Dementia Mother   . Cancer Mother   . Cancer Maternal Grandmother     Social History:  Social History   Socioeconomic History  . Marital status: Divorced    Spouse name: Not on file  . Number of children: Not on file  . Years of education: Not on file  . Highest education level: Not on file   Occupational History  . Not on file  Social Needs  . Financial resource strain: Not on file  . Food insecurity:    Worry: Not on file    Inability: Not on file  . Transportation needs:    Medical: Not on file    Non-medical: Not on file  Tobacco Use  . Smoking status: Never Smoker  . Smokeless tobacco: Never Used  Substance and Sexual Activity  . Alcohol use: No    Alcohol/week: 0.0 oz    Comment: 09/06/2017 "I'll have a glass of wine a couple times/year; maybe"  . Drug use: No  . Sexual activity: Not Currently  Lifestyle  . Physical activity:    Days per week: Not on file    Minutes per session: Not on file  . Stress: Not on file  Relationships  . Social connections:    Talks on phone: Not on file    Gets together: Not on file    Attends religious service: Not on file    Active member of club or organization: Not on file    Attends meetings of clubs or organizations: Not on file    Relationship status: Not on file  Other Topics Concern  . Not on file  Social History Narrative  . Not on file    Allergies:  Allergies  Allergen Reactions  . Zofran [Ondansetron Hcl] Hives and Other (See Comments)    Spots around iv site  . Droperidol Palpitations  . Flexeril [Cyclobenzaprine] Anxiety  . Morphine And Related Itching  . Tessalon [Benzonatate] Other (See Comments)    Watery eyes    Metabolic Disorder Labs: Lab Results  Component Value Date   HGBA1C 5.2 02/28/2017   MPG  07/05/2015    QUESTIONABLE IDENTIFICATION / INCORRECTLY LABELED SPECIMEN   No results found for: PROLACTIN Lab Results  Component Value Date   CHOL 237 (H) 04/27/2015   TRIG 144 04/27/2015   HDL 70 04/27/2015   CHOLHDL 3.4 04/27/2015   VLDL 29 04/27/2015   LDLCALC 138 (H) 04/27/2015   Lab Results  Component Value Date   TSH  07/05/2015    QUESTIONABLE IDENTIFICATION / INCORRECTLY LABELED SPECIMEN   TSH 3.540 04/27/2015    Therapeutic Level Labs: No results found for: LITHIUM No  results found for: VALPROATE No components found for:  CBMZ  Current Medications: Current Outpatient Medications  Medication Sig Dispense Refill  . cefUROXime (CEFTIN) 500 MG tablet Take 1 tablet (500 mg total) by mouth 2 (two) times daily with a meal. 12 tablet 0  . doxycycline (VIBRAMYCIN) 100 MG capsule Take 1 capsule (100 mg total) by mouth 2 (two) times daily. One po bid x 7 days 14 capsule 0  . escitalopram (LEXAPRO) 20 MG tablet Take 1 tablet (20 mg total) by mouth daily. 90 tablet 0  . gabapentin (NEURONTIN) 300 MG capsule TAKE 2 CAPSULES (600 MG TOTAL) BY  MOUTH 3 TIMES DAILY. 180 capsule 3  . HYDROcodone-acetaminophen (NORCO) 10-325 MG tablet hydrocodone 10 mg-acetaminophen 325 mg tablet  Take 1 tablet every day by oral route for 15 days.    . phenazopyridine (PYRIDIUM) 100 MG tablet Take 1 tablet (100 mg total) by mouth 3 (three) times daily with meals. 15 tablet 0  . QUEtiapine (SEROQUEL) 200 MG tablet Take 1 tablet (200 mg total) by mouth at bedtime. 90 tablet 0  . traMADol (ULTRAM) 50 MG tablet tramadol 50 mg tablet  Take 1 tablet 4 times a day by oral route for 7 days.     No current facility-administered medications for this visit.      Musculoskeletal: Strength & Muscle Tone: within normal limits Gait & Station: normal Patient leans: N/A  Psychiatric Specialty Exam: ROS  Blood pressure 103/71, pulse 89, height 5\' 4"  (1.626 m), weight 192 lb (87.1 kg), SpO2 99 %.Body mass index is 32.96 kg/m.  General Appearance: Casual and Well Groomed  Eye Contact:  Good  Speech:  Clear and Coherent  Volume:  Normal  Mood:  Euthymic  Affect:  Congruent  Thought Process:  Goal Directed and Descriptions of Associations: Intact  Orientation:  Full (Time, Place, and Person)  Thought Content: Logical   Suicidal Thoughts:  No  Homicidal Thoughts:  No  Memory:  Immediate;   Fair  Judgement:  Fair  Insight:  Fair  Psychomotor Activity:  Normal  Concentration:  Concentration: Good   Recall:  Good  Fund of Knowledge: Good  Language: Good  Akathisia:  Negative  Handed:  Right  AIMS (if indicated): not done  Assets:  Communication Skills Desire for Improvement Housing Transportation  ADL's:  Intact  Cognition: WNL  Sleep:  Fair   Screenings: PHQ2-9     Office Visit from 08/28/2017 in [facility] Office Visit from 05/22/2017 in [facility] Office Visit from 04/21/2017 in [facility] Office Visit from 04/05/2017 in [facility] Office Visit from 02/28/2017 in [facility]  PHQ-2 Total Score  0  0  3  0  0  PHQ-9 Total Score  -  -  11  -  -       Assessment and Plan:  Sabrina [name] is a 52 year old female with a history of generalized anxiety disorder, panic attacks, and depression.  She has some personality disordered features and difficulty with healthy coping.  She tends to be externally focused and we spent ample time building some recognition into focusing on her internal psyche and abilities.  She is agreeable to start participation in individual therapy in office.  Overall, presents with improving and stabilizing mood, continues to struggle with some difficulty with sleep.  We agreed to increase Seroquel given that she has tolerated the medication without any significant increase in appetite or side effects to report.  Patient is aware that writer is transitioning out of office and her follow-up visit in September or October time frame will be with a different provider in office.  No acute safety issues.  1. Panic disorder   2. Severe episode of recurrent major depressive disorder, without psychotic features (HCC)     Status of current problems: gradually improving  Labs Ordered: No orders of the defined types were placed in this encounter.   Labs Reviewed: n/a  Collateral Obtained/Records Reviewed: n/a  Plan:  Continue Lexapro 20 mg daily Increase Seroquel  to 200  mg nightly Return to clinic in 3-4 months, transfer care to Dr. Hector Brunswick, MD 02/09/2018, 8:07 AM

## 2018-02-13 ENCOUNTER — Other Ambulatory Visit (HOSPITAL_COMMUNITY): Payer: Self-pay | Admitting: Psychiatry

## 2018-02-13 ENCOUNTER — Telehealth (HOSPITAL_COMMUNITY): Payer: Self-pay

## 2018-02-13 DIAGNOSIS — F332 Major depressive disorder, recurrent severe without psychotic features: Secondary | ICD-10-CM

## 2018-02-13 DIAGNOSIS — F410 Panic disorder [episodic paroxysmal anxiety] without agoraphobia: Secondary | ICD-10-CM

## 2018-02-13 NOTE — Telephone Encounter (Signed)
The goal was for her to give the seroquel a couple weeks for effect, so we can consider it then.  The clonidine seemed to cause her dizziness issues so if we can avoid that, it would be best.  Okay to send the clonidine 0.1 mg BID but I would suggest she still wait a week before she start it

## 2018-02-13 NOTE — Telephone Encounter (Signed)
Patient is calling to see if you can send in the Clonidine that you talked about. Please review and advise, thank you

## 2018-03-05 MED FILL — GABAPENTIN 300 MG CAPSULE: 300 | 30 days supply | Qty: 180 | Fill #2

## 2018-03-05 MED FILL — QUETIAPINE FUMARATE 200 MG: 200 | 30 days supply | Qty: 30 | Fill #1

## 2018-03-05 MED FILL — ESCITALOPRAM 20 MG TABLET: 20 | 30 days supply | Qty: 30 | Fill #2

## 2018-03-08 ENCOUNTER — Encounter

## 2018-03-08 ENCOUNTER — Ambulatory Visit (INDEPENDENT_AMBULATORY_CARE_PROVIDER_SITE_OTHER): Payer: Self-pay | Admitting: Licensed Clinical Social Worker

## 2018-03-08 DIAGNOSIS — F410 Panic disorder [episodic paroxysmal anxiety] without agoraphobia: Secondary | ICD-10-CM

## 2018-03-08 DIAGNOSIS — F332 Major depressive disorder, recurrent severe without psychotic features: Secondary | ICD-10-CM

## 2018-03-09 ENCOUNTER — Encounter (HOSPITAL_COMMUNITY): Payer: Self-pay | Admitting: Licensed Clinical Social Worker
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
## 2018-03-10 ENCOUNTER — Ambulatory Visit (HOSPITAL_COMMUNITY)
Admission: EM | Admit: 2018-03-10 | Discharge: 2018-03-10 | Disposition: A | Discharge status: Home / Self Care | Payer: Self-pay | Attending: Family Medicine | Admitting: Family Medicine

## 2018-03-10 ENCOUNTER — Telehealth (HOSPITAL_COMMUNITY): Payer: Self-pay | Admitting: *Deleted

## 2018-03-10 ENCOUNTER — Encounter (HOSPITAL_COMMUNITY): Payer: Self-pay

## 2018-03-10 DIAGNOSIS — L120 Bullous pemphigoid: Secondary | ICD-10-CM

## 2018-03-10 MED ORDER — FLUOCINONIDE-E 0.05 % EX CREA: 1.0000 "application " | TOPICAL_CREAM | Freq: Three times a day (TID) | CUTANEOUS | 0 refills | Status: DC
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
## 2018-03-10 NOTE — ED Notes (Signed)
Bed: UC01 Expected date:  Expected time:  Means of arrival:  Comments: appt 

## 2018-03-10 NOTE — Discharge Instructions (Addendum)
One preferred first-line therapy for patients with bullous pemphigoid is a high potency topical corticosteroid due to evidence that supports the efficacy and relative safety of this treatment. A systemic glucocorticoid is another acceptable first-line treatment option that we typically use when factors such as drug cost, drug availability, or an inability to properly administer topical treatment preclude the use of topical therapy.  -Up to Date

## 2018-03-10 NOTE — ED Triage Notes (Signed)
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

## 2018-03-29 ENCOUNTER — Telehealth (HOSPITAL_COMMUNITY): Payer: Self-pay

## 2018-03-29 NOTE — Telephone Encounter (Signed)
I would suggest she take a low dose of melatonin nightly with her Seroquel to see if this provides relief.  Melatonin 1 mg over the counter is a good dose to start

## 2018-03-29 NOTE — Telephone Encounter (Signed)
Patient is calling due to having trouble sleeping again. Patient states it has been a couple of days now and she thinks it is from her anxiety. Please review and advise, thank you

## 2018-03-29 NOTE — Telephone Encounter (Signed)
I called patient and let her know and she stated she would try it and call me on Monday and let me know if it works.

## 2018-04-02 MED FILL — QUETIAPINE FUMARATE 200 MG: 200 | 30 days supply | Qty: 30 | Fill #2

## 2018-04-02 MED FILL — GABAPENTIN 300 MG CAPSULE: 300 | 30 days supply | Qty: 180 | Fill #3

## 2018-04-06 MED FILL — ESCITALOPRAM 20 MG TABLET: 20 | 30 days supply | Qty: 30 | Fill #3

## 2018-04-10 ENCOUNTER — Telehealth (HOSPITAL_COMMUNITY): Payer: Self-pay

## 2018-04-10 NOTE — Telephone Encounter (Signed)
Patient is calling because she said she is still not sleeping after adding Melatonin. She states that she can not shut off her brain at night. She is also concerned that she has gained 15 lbs on Seroquel, she is very upset by this. She said that her anxiety is high ad she is not sure that the Lexapro is working.Please review and advise, thank you

## 2018-04-10 NOTE — Telephone Encounter (Signed)
She is welcome to schedule a sooner visit. We discussed the side effects of seroquel and I am sorry to hear she is gaining weight. We can troubleshoot at a follow-up visit.

## 2018-04-12 ENCOUNTER — Encounter

## 2018-04-12 ENCOUNTER — Ambulatory Visit (HOSPITAL_COMMUNITY): Payer: Self-pay | Admitting: Licensed Clinical Social Worker

## 2018-04-19 ENCOUNTER — Other Ambulatory Visit (HOSPITAL_COMMUNITY): Payer: Self-pay | Admitting: Psychiatry

## 2018-04-19 ENCOUNTER — Ambulatory Visit (INDEPENDENT_AMBULATORY_CARE_PROVIDER_SITE_OTHER): Payer: Self-pay | Admitting: Licensed Clinical Social Worker

## 2018-04-19 DIAGNOSIS — F332 Major depressive disorder, recurrent severe without psychotic features: Secondary | ICD-10-CM

## 2018-04-19 DIAGNOSIS — F410 Panic disorder [episodic paroxysmal anxiety] without agoraphobia: Secondary | ICD-10-CM

## 2018-04-19 MED ORDER — QUETIAPINE FUMARATE 100 MG PO TABS: 200.0000 mg | ORAL_TABLET | Freq: Every day | ORAL | 0 refills | Status: DC

## 2018-04-19 MED ORDER — TOPIRAMATE 50 MG PO TABS: 50.0000 mg | ORAL_TABLET | Freq: Every day | ORAL | 2 refills | Status: DC

## 2018-04-19 MED FILL — TOPIRAMATE 50 MG TABLET: 50 | 62 days supply | Qty: 60 | Fill #0

## 2018-04-19 NOTE — Progress Notes (Signed)
Discussed with patient regarding weight concerns on seroquel and effcets for sleep have been inconsistent. Agreed to start topamax nightly for weight, anxiety and sleep. Reviewed risks of SJS and cognitive dulling.  Agreed to taper seroquel to discontinue as well.

## 2018-04-20 ENCOUNTER — Encounter (HOSPITAL_COMMUNITY): Payer: Self-pay | Admitting: Licensed Clinical Social Worker

## 2018-04-20 MED FILL — QUETIAPINE FUMARATE 100 MG: 100 | 7 days supply | Qty: 7 | Fill #0
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
## 2018-04-26 ENCOUNTER — Ambulatory Visit (HOSPITAL_COMMUNITY): Payer: Self-pay | Admitting: Licensed Clinical Social Worker

## 2018-04-27 ENCOUNTER — Other Ambulatory Visit: Payer: Self-pay | Admitting: Family Medicine

## 2018-04-27 DIAGNOSIS — G609 Hereditary and idiopathic neuropathy, unspecified: Secondary | ICD-10-CM

## 2018-04-30 ENCOUNTER — Ambulatory Visit (INDEPENDENT_AMBULATORY_CARE_PROVIDER_SITE_OTHER): Payer: Self-pay | Admitting: Orthopaedic Surgery

## 2018-04-30 ENCOUNTER — Other Ambulatory Visit: Payer: Self-pay | Admitting: Internal Medicine

## 2018-04-30 ENCOUNTER — Telehealth: Payer: Self-pay

## 2018-04-30 DIAGNOSIS — G609 Hereditary and idiopathic neuropathy, unspecified: Secondary | ICD-10-CM

## 2018-04-30 MED FILL — GABAPENTIN 300 MG CAPSULE: 300 | 30 days supply | Qty: 180 | Fill #0

## 2018-05-01 ENCOUNTER — Ambulatory Visit (INDEPENDENT_AMBULATORY_CARE_PROVIDER_SITE_OTHER): Payer: Self-pay | Admitting: Orthopaedic Surgery

## 2018-05-02 NOTE — Telephone Encounter (Signed)
Sabrina [name],   Please contact patient to remind her that it is important for her to follow up. I have never seen patient, and she has had several cancellations with Sabrina [name]. Possibly transportation problems? Remind her that she needs to be assessed in office with labs, blood pressures, etc in order to continue to get refills on medications to ensure that her meds continue to be safe for her. After she makes her appointment we can give her enough meds until she comes in.   Sabrina [name],   Please schedule patient for follow up appointment as soon as she can come in to office.   Thanks.

## 2018-05-03 ENCOUNTER — Ambulatory Visit (HOSPITAL_COMMUNITY): Payer: Self-pay | Admitting: Licensed Clinical Social Worker

## 2018-05-03 MED FILL — ESCITALOPRAM 20 MG TABLET: 20 | 30 days supply | Qty: 30 | Fill #0

## 2018-05-03 NOTE — Telephone Encounter (Signed)
Spoke with patient and she states that she will give us a call after Labor Day to schedule

## 2018-05-09 ENCOUNTER — Ambulatory Visit (HOSPITAL_COMMUNITY): Payer: Self-pay | Admitting: Licensed Clinical Social Worker

## 2018-05-17 ENCOUNTER — Ambulatory Visit (HOSPITAL_COMMUNITY): Payer: Self-pay | Admitting: Licensed Clinical Social Worker

## 2018-05-18 ENCOUNTER — Telehealth (HOSPITAL_COMMUNITY): Payer: Self-pay

## 2018-05-18 NOTE — Telephone Encounter (Signed)
She is already on 200mg  serouqel, as per last provider notes. Ask her to work on sleep hygiene, go to bed late, avoid naps and coffee. Can add small dose of trazadone 50mg  try half tablet a night before jumping to full tablet in 3 days

## 2018-05-18 NOTE — Telephone Encounter (Signed)
Patient is calling for something to help her sleep. Patient was seeing doctor Rene Kocher and follows up with Dr. Donell Beers later this month. Patient has a history of problems sleeping. Please review and advise, thank you

## 2018-05-25 MED FILL — ESCITALOPRAM 20 MG TABLET: 20 | 30 days supply | Qty: 30 | Fill #1

## 2018-05-25 MED FILL — GABAPENTIN 300 MG CAPSULE: 300 | 30 days supply | Qty: 180 | Fill #1

## 2018-06-01 ENCOUNTER — Encounter (HOSPITAL_COMMUNITY): Payer: Self-pay | Admitting: Psychiatry

## 2018-06-01 ENCOUNTER — Ambulatory Visit (INDEPENDENT_AMBULATORY_CARE_PROVIDER_SITE_OTHER): Payer: Self-pay | Admitting: Psychiatry

## 2018-06-01 VITALS — BP 124/70 | Ht 64.0 in | Wt 197.0 lb

## 2018-06-01 DIAGNOSIS — F410 Panic disorder [episodic paroxysmal anxiety] without agoraphobia: Secondary | ICD-10-CM

## 2018-06-01 MED ORDER — SERTRALINE HCL 50 MG PO TABS: ORAL_TABLET | ORAL | 2 refills | Status: DC

## 2018-06-01 MED ORDER — CLONAZEPAM 0.5 MG PO TABS: ORAL_TABLET | ORAL | 3 refills | Status: DC

## 2018-06-01 MED FILL — SERTRALINE HCL 50 MG TABLET: 50 | 30 days supply | Qty: 30 | Fill #0

## 2018-06-01 MED FILL — clonazePAM 0.5 MG TABS: 0.5 | 30 days supply | Qty: 60 | Fill #0

## 2018-06-01 NOTE — Progress Notes (Signed)
BH MD/PA/NP OP Progress Note  06/01/2018 11:36 AM Sabrina [name]  MRN:  [id number]  Chief Complaint: anxiety HPI:  Today this patient was seen first time by me. He is in our system and is diagnosed with major depression panic disorder. Presently her mood is fairly good. She does describe chronic anxiety. This is considered to be to a mild degree. The patient lives alone. Presently she is in no relationships. Her divorce is 19. She is one daughter and one granddaughter live in [place] at this time. 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 She'll find out what money she'll have that she go back she's are to return to work. At this time the patient denies daily depression. He is having problems sleeping. She cannot initiate sleep. Fortunately she has minimal daytime dysfunction from this problem. Her appetite is good her energy level is goodconcentrate without a problem. She's not suicidal now and never has been. The patient watch TV and enjoying herself she also rides a horse. The patient denies the use of alcohol or substance. She is no evidence of psychosis now or ever. She's never been manic. This year in the beginning of the year she experienced persistent daily depression sleep or appetite at her energy. She problems concentrating but was never suicidal. The patient is never been a patient in a psychiatric hospital presently she is in therapy in the setting. In the last month the patient is been taken off Seroquel which was supposedly used for sleep and also taken off Topamax.The patient describes clear episodes occur once or month where she is intense anxiety that occurs out of the blue.They're very comfortable and she does worry about the future. They're associated with shortness of breath paresthesias and other physical symptoms. Visit Diagnosis:  No diagnosis found.  Past  Psychiatric History: See intake H&P for full details. Reviewed, with no updates at this time.   Past Medical History:  Past Medical History:  Diagnosis Date  . Arthritis    "knees" (09/06/2017)  . Bullous pemphigus   . Cat bite 06/2014   to left elbow  . History of blood transfusion 1988   "when I had my baby"  . Muscle weakness of lower extremity 2001; 09/05/2017   "resolved after a couple weeks; ?" (09/06/2017)  . 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 Poisoning, snake bite 04/08/2016   "[place]; RUE"  . PONV (postoperative nausea and vomiting)   . Situational anxiety   . Staph infection ~ 2015   "left elbow and finger"    Past Surgical History:  Procedure Laterality Date  . APPENDECTOMY  ~ 1987  . APPLICATION OF A-CELL OF EXTREMITY Left 08/05/2015   Procedure: APPLICATION OF A-CELL OF EXTREMITY;  Surgeon: Peggye Form, DO;  Location: [facility];  Service: Plastics;  Laterality: Left;  . BREAST SURGERY Right 1990   "milk duct taken out"  . DEBRIDEMENT AND CLOSURE WOUND Left 07/01/2015   Procedure: LEFT ELBOW EXCISION OF WOUND WITH PRIMARY CLOSURE 2X5 CM ;  Surgeon: Peggye Form, DO;  Location: [facility];  Service: Plastics;  Laterality: Left;  . ELBOW SURGERY Left X 23 in [place] <06/2015   from a cat bite; all I&D  . I&D EXTREMITY Left 07/08/2015   Procedure: IRRIGATION AND DEBRIDEMENT EXTREMITY, DRAINAGE OF LEFT ARM WOUND, A-CELL PLACEMENT, WOUND VAC PLACEMENT;  Surgeon: Alena Bills Dillingham, DO;  Location: [facility];  Service: Plastics;  Laterality: Left;  . INCISION AND DRAINAGE OF WOUND Left 08/05/2015   Procedure: IRRIGATION AND DEBRIDEMENT LEFT ELBOW WOUND, PLACEMENT OF ACELL;  Surgeon: Peggye Form, DO;  Location: [facility];  Service: Plastics;  Laterality: Left;  . LAPAROSCOPIC CHOLECYSTECTOMY  1998  . SKIN GRAFT Left 2016   took from anterior thigh; placed at elbow  . TONSILLECTOMY  ~ 2000  . TOTAL  ABDOMINAL HYSTERECTOMY  2003    Family Psychiatric History: See intake H&P for full details. Reviewed, with no updates at this time.   Family History:  Family History  Problem Relation Age of Onset  . Liver disease Mother   . Dementia Mother   . Cancer Mother   . Cancer Maternal Grandmother     Social History:  Social History   Socioeconomic History  . Marital status: Divorced    Spouse name: Not on file  . Number of children: Not on file  . Years of education: Not on file  . Highest education level: Not on file  Occupational History  . Not on file  Social Needs  . Financial resource strain: Not on file  . Food insecurity:    Worry: Not on file    Inability: Not on file  . Transportation needs:    Medical: Not on file    Non-medical: Not on file  Tobacco Use  . Smoking status: Never Smoker  . Smokeless tobacco: Never Used  Substance and Sexual Activity  . Alcohol use: No    Alcohol/week: 0.0 standard drinks    Comment: 09/06/2017 "I'll have a glass of wine a couple times/year; maybe"  . Drug use: No  . Sexual activity: Not Currently  Lifestyle  . Physical activity:    Days per week: Not on file    Minutes per session: Not on file  . Stress: Not on file  Relationships  . Social connections:    Talks on phone: Not on file    Gets together: Not on file    Attends religious service: Not on file    Active member of club or organization: Not on file    Attends meetings of clubs or organizations: Not on file    Relationship status: Not on file  Other Topics Concern  . Not on file  Social History Narrative  . Not on file    Allergies:  Allergies  Allergen Reactions  . Zofran [Ondansetron Hcl] Hives and Other (See Comments)    Spots around iv site  . Droperidol Palpitations  . Flexeril [Cyclobenzaprine] Anxiety  . Morphine And Related Itching  . Tessalon [Benzonatate] Other (See Comments)    Watery eyes    Metabolic Disorder Labs: Lab Results   Component Value Date   HGBA1C 5.2 02/28/2017   MPG  07/05/2015    QUESTIONABLE IDENTIFICATION / INCORRECTLY LABELED SPECIMEN   No results found for: PROLACTIN Lab Results  Component Value Date   CHOL 237 (H) 04/27/2015   TRIG 144 04/27/2015   HDL 70 04/27/2015   CHOLHDL 3.4 04/27/2015   VLDL 29 04/27/2015   LDLCALC 138 (H) 04/27/2015   Lab Results  Component Value Date   TSH  07/05/2015    QUESTIONABLE IDENTIFICATION / INCORRECTLY LABELED SPECIMEN   TSH 3.540 04/27/2015    Therapeutic Level Labs: No results found for: LITHIUM No results found for: VALPROATE No components found for:  CBMZ  Current Medications: Current  Outpatient Medications  Medication Sig Dispense Refill  . escitalopram (LEXAPRO) 20 MG tablet Take 1 tablet (20 mg total) by mouth daily. 90 tablet 0  . fluocinonide-emollient (LIDEX-E) 0.05 % cream Apply 1 application topically 3 (three) times daily. 60 g 0  . gabapentin (NEURONTIN) 300 MG capsule TAKE 2 CAPSULES (600 MG TOTAL) BY MOUTH 3 TIMES DAILY. 180 capsule 3  . HYDROcodone-acetaminophen (NORCO) 10-325 MG tablet hydrocodone 10 mg-acetaminophen 325 mg tablet  Take 1 tablet every day by oral route for 15 days.    Marland Kitchen. QUEtiapine (SEROQUEL) 100 MG tablet Take 2 tablets (200 mg total) by mouth at bedtime. Take 1 tablet (100 mg) nightly for 1 week, then 1/2 tablet for 1 week, then stop 7 tablet 0  . topiramate (TOPAMAX) 50 MG tablet Take 1 tablet (50 mg total) by mouth at bedtime. Take 1/2 tablet for 3 days then increase to the full tablet at night. 60 tablet 2  . traMADol (ULTRAM) 50 MG tablet tramadol 50 mg tablet  Take 1 tablet 4 times a day by oral route for 7 days.    . clonazePAM (KLONOPIN) 0.5 MG tablet 1  qhs    1  prn 60 tablet 3  . sertraline (ZOLOFT) 50 MG tablet 1  qam 30 tablet 2   No current facility-administered medications for this visit.      Musculoskeletal: Strength & Muscle Tone: within normal limits Gait & Station: normal Patient  leans: N/A  Psychiatric Specialty Exam: ROS  Blood pressure 124/70, height 5\' 4"  (1.626 m), weight 197 lb (89.4 kg).Body mass index is 33.81 kg/m.  General Appearance: Casual and Well Groomed  Eye Contact:  Good  Speech:  Clear and Coherent  Volume:  Normal  Mood:  Euthymic  Affect:  Congruent  Thought Process:  Goal Directed and Descriptions of Associations: Intact  Orientation:  Full (Time, Place, and Person)  Thought Content: Logical   Suicidal Thoughts:  No  Homicidal Thoughts:  No  Memory:  Immediate;   Fair  Judgement:  Fair  Insight:  Fair  Psychomotor Activity:  Normal  Concentration:  Concentration: Good  Recall:  Good  Fund of Knowledge: Good  Language: Good  Akathisia:  Negative  Handed:  Right  AIMS (if indicated): not done  Assets:  Communication Skills Desire for Improvement Housing Transportation  ADL's:  Intact  Cognition: WNL  Sleep:  Fair   Screenings: PHQ2-9     Office Visit from 08/28/2017 in [facility] Office Visit from 05/22/2017 in [facility] Office Visit from 04/21/2017 in [facility] Office Visit from 04/05/2017 in [facility] Office Visit from 02/28/2017 in [facility]  PHQ-2 Total Score  0  0  3  0  0  PHQ-9 Total Score  -  -  11  -  -       Assessment and Plan:   Today the patient's first problem is that of anxiety. I suspect she has had panic episodes. They don't occur that frequently and it caused some degree of associated symptoms. She does have some anticipatory anxiety. She also describes some degree of avoidance because of the attacks. At this time her treatment will be to discontinue her Lexapro and begin her on Zoloft specifically is for panic attacks. She started a low-dose 50 mg. Issue panic attack should be dealt with back in therapy. Her second problem is that of major clinical  depression. At this time Zoloft will be appropriate for  this. Her third problem is that her chronic anxiety. I think to some degree she's having anxiety it is specifically related to her finances.. Therefore in some ways is to be divided adjustment disorder with anxious mood state. At this time we will begin him on Klonopin 0.5 mg at night which will help her sleep and possibly have affected her next anxiety. She also given the opportunity to taking Klonopin 0.5 mg when necessary. At this time the patient is functioning very well. She's back to work. Lites were area she likes the setting visit. Essentially we'll discontinue her Lexapro changed to Zoloft. Klonopin. The patient return to see me in the next 2 months. No diagnosis found.  Status of current problems: gradually improving  Labs Ordered: No orders of the defined types were placed in this encounter.   Labs Reviewed: n/a  Collateral Obtained/Records Reviewed: n/a  Plan:  Continue Lexapro 20 mg daily Increase Seroquel to 200 mg nightly Return to clinic in 3-4 months, transfer care to Dr. Liz Beach, MD 06/01/2018, 11:36 AM

## 2018-06-08 ENCOUNTER — Telehealth (HOSPITAL_COMMUNITY): Payer: Self-pay

## 2018-06-08 NOTE — Telephone Encounter (Signed)
Patient feels like the Clonazepam 0.5mg  tabs isn't working. She feels like her anxiety is still currently the same. Next appointment is scheduled for 07-24-18.    Please advise.

## 2018-06-12 MED ORDER — SERTRALINE HCL 50 MG PO TABS: ORAL_TABLET | ORAL | 2 refills | Status: DC

## 2018-06-12 MED FILL — SERTRALINE HCL 50 MG TABLET: 50 | 30 days supply | Qty: 60 | Fill #0

## 2018-06-12 NOTE — Telephone Encounter (Signed)
Will not  Increase klonopine    She  should take  1  Or  2 prn   Now should double Zoloft to 2  qam and keep next apt

## 2018-06-15 ENCOUNTER — Telehealth (HOSPITAL_COMMUNITY): Payer: Self-pay

## 2018-06-15 NOTE — Telephone Encounter (Signed)
Called and left voicemail message for patient to return phone call to explain message from Dr. Donell Beers

## 2018-06-18 ENCOUNTER — Ambulatory Visit (INDEPENDENT_AMBULATORY_CARE_PROVIDER_SITE_OTHER): Payer: Self-pay | Admitting: Family Medicine

## 2018-06-18 ENCOUNTER — Encounter: Payer: Self-pay | Admitting: Family Medicine

## 2018-06-18 VITALS — BP 108/72 | HR 88 | Temp 98.2°F | Ht 64.0 in | Wt 196.0 lb

## 2018-06-18 DIAGNOSIS — M25439 Effusion, unspecified wrist: Secondary | ICD-10-CM

## 2018-06-18 DIAGNOSIS — Z0000 Encounter for general adult medical examination without abnormal findings: Secondary | ICD-10-CM

## 2018-06-18 DIAGNOSIS — G609 Hereditary and idiopathic neuropathy, unspecified: Secondary | ICD-10-CM

## 2018-06-18 DIAGNOSIS — F419 Anxiety disorder, unspecified: Secondary | ICD-10-CM

## 2018-06-18 DIAGNOSIS — Z09 Encounter for follow-up examination after completed treatment for conditions other than malignant neoplasm: Secondary | ICD-10-CM

## 2018-06-18 DIAGNOSIS — Z131 Encounter for screening for diabetes mellitus: Secondary | ICD-10-CM

## 2018-06-18 LAB — POCT GLYCOSYLATED HEMOGLOBIN (HGB A1C): Hemoglobin A1C: 5.2 % (ref 4.0–5.6)

## 2018-06-18 MED ORDER — GABAPENTIN 300 MG PO CAPS: ORAL_CAPSULE | ORAL | 3 refills | Status: DC

## 2018-06-18 MED FILL — GABAPENTIN 300 MG CAPSULE: 300 | 30 days supply | Qty: 180 | Fill #0

## 2018-06-18 NOTE — Progress Notes (Signed)
Follow Up  Subjective:    Patient ID: Sabrina [name], female    DOB: 1966/02/02, 52 y.o.   MRN: [id number]  HPI  Sabrina [name] is a 52 year old female with a past medical history of Anxiety, Osteomyelitis of Elbow, Muscle Weakness of Lower Extremities, Cat Bite, Bullous Pemphigus, and Arthritis. She is here today for follow up.   Current Status: Since her last office visit, she is doing well with no complaints. She is concerned about weight gain. She has been out of work for about a year and has recently returned a month ago. She has a history of Upper Respiratory Infections, and has noticed post nasal drip and cough, which she took Delson for relief. She denies fevers, chills, fatigue, recent infections, weight loss, and night sweats. She has recent near vision problems. She has not had any headaches, dizziness, and falls. She has mild pain in her right wrist. She has routine follow ups with Psychiatrist regularly. Her anxiety is stable today.   No chest pain, heart palpitations, cough and shortness of breath reported. No reports of GI problems such as nausea, vomiting, diarrhea, and constipation. She has no reports of blood in stools, dysuria and hematuria.  Review of Systems  Constitutional: Negative.   HENT: Negative.   Respiratory: Negative.   Cardiovascular: Negative.   Gastrointestinal: Positive for abdominal distention (Obese).  Genitourinary: Negative.   Musculoskeletal: Negative.   Skin: Negative.   Allergic/Immunologic: Negative.   Neurological: Negative.   Psychiatric/Behavioral: Negative.    Objective:   Physical Exam  Constitutional: She is oriented to person, place, and time. She appears well-developed and well-nourished.  HENT:  Head: Normocephalic and atraumatic.  Neck: Normal range of motion. Neck supple.  Cardiovascular: Normal rate, regular rhythm, normal heart sounds and intact distal pulses.  Pulmonary/Chest: Effort normal and breath sounds normal.  Abdominal:  Soft. Bowel sounds are normal.  Musculoskeletal:  Mild right wrist pain  Neurological: She is alert and oriented to person, place, and time.  Skin: Skin is warm and dry.  Psychiatric: She has a normal mood and affect. Her behavior is normal. Judgment and thought content normal.  Nursing note and vitals reviewed.  Assessment & Plan:   1. Anxiety Stable. Continue regular follow ups with Psychiatrist.   2. Hereditary and idiopathic peripheral neuropathy Refill. - gabapentin (NEURONTIN) 300 MG capsule; TAKE 2 CAPSULES (600 MG TOTAL) BY MOUTH 3 TIMES DAILY.  Dispense: 180 capsule; Refill: 3  3. Wrist swelling Stable.   4. Screening for diabetes mellitus Hgb A1c is within normal range at 5.2. She will continue to decrease foods/beverages high in sugars and carbs and follow Heart Healthy or DASH diet. Increase physical activity to at least 30 minutes cardio exercise daily.  - POCT glycosylated hemoglobin (Hb A1C) - POCT urinalysis dipstick  5. Healthcare maintenance - CBC with Differential - Comprehensive metabolic panel - TSH - Lipid Panel - Vitamin D, 25-hydroxy - Vitamin B12  6. Follow up She will be follow up in 6 months.   Meds ordered this encounter  Medications  . gabapentin (NEURONTIN) 300 MG capsule    Sig: TAKE 2 CAPSULES (600 MG TOTAL) BY MOUTH 3 TIMES DAILY.    Dispense:  180 capsule    Refill:  3    [name],  MSN, [facility] [address] [place], [place] [address] [phone]

## 2018-06-19 LAB — CBC WITH DIFFERENTIAL/PLATELET
Basophils Absolute: 0 10*3/uL (ref 0.0–0.2)
Basos: 1 %
EOS (ABSOLUTE): 0.1 10*3/uL (ref 0.0–0.4)
Eos: 3 %
Hematocrit: 38 % (ref 34.0–46.6)
Hemoglobin: 12.6 g/dL (ref 11.1–15.9)
Immature Grans (Abs): 0 10*3/uL (ref 0.0–0.1)
Immature Granulocytes: 0 %
Lymphocytes Absolute: 1.3 10*3/uL (ref 0.7–3.1)
Lymphs: 39 %
MCH: 31.6 pg (ref 26.6–33.0)
MCHC: 33.2 g/dL (ref 31.5–35.7)
MCV: 95 fL (ref 79–97)
Monocytes Absolute: 0.4 10*3/uL (ref 0.1–0.9)
Monocytes: 12 %
Neutrophils Absolute: 1.5 10*3/uL (ref 1.4–7.0)
Neutrophils: 45 %
Platelets: 247 10*3/uL (ref 150–450)
RBC: 3.99 x10E6/uL (ref 3.77–5.28)
RDW: 12.5 % (ref 12.3–15.4)
WBC: 3.3 10*3/uL — ABNORMAL LOW (ref 3.4–10.8)

## 2018-06-19 LAB — COMPREHENSIVE METABOLIC PANEL
ALT: 14 IU/L (ref 0–32)
AST: 19 IU/L (ref 0–40)
Albumin/Globulin Ratio: 1.9 (ref 1.2–2.2)
Albumin: 4.4 g/dL (ref 3.5–5.5)
Alkaline Phosphatase: 87 IU/L (ref 39–117)
BUN/Creatinine Ratio: 16 (ref 9–23)
BUN: 13 mg/dL (ref 6–24)
Bilirubin Total: 0.3 mg/dL (ref 0.0–1.2)
CO2: 24 mmol/L (ref 20–29)
Calcium: 9.2 mg/dL (ref 8.7–10.2)
Chloride: 104 mmol/L (ref 96–106)
Creatinine, Ser: 0.83 mg/dL (ref 0.57–1.00)
GFR calc Af Amer: 96 mL/min/{1.73_m2} (ref >59)
GFR calc non Af Amer: 83 mL/min/{1.73_m2} (ref >59)
Globulin, Total: 2.3 g/dL (ref 1.5–4.5)
Glucose: 84 mg/dL (ref 65–99)
Potassium: 3.8 mmol/L (ref 3.5–5.2)
Sodium: 143 mmol/L (ref 134–144)
Total Protein: 6.7 g/dL (ref 6.0–8.5)

## 2018-06-19 LAB — TSH: TSH: 2.89 u[IU]/mL (ref 0.450–4.500)

## 2018-06-19 LAB — LIPID PANEL
Chol/HDL Ratio: 2.9 ratio (ref 0.0–4.4)
Cholesterol, Total: 209 mg/dL — ABNORMAL HIGH (ref 100–199)
HDL: 73 mg/dL (ref >39)
LDL Calculated: 122 mg/dL — ABNORMAL HIGH (ref 0–99)
Triglycerides: 71 mg/dL (ref 0–149)
VLDL Cholesterol Cal: 14 mg/dL (ref 5–40)

## 2018-06-19 LAB — VITAMIN D 25 HYDROXY (VIT D DEFICIENCY, FRACTURES): Vit D, 25-Hydroxy: 8.9 ng/mL — ABNORMAL LOW (ref 30.0–100.0)

## 2018-06-19 LAB — VITAMIN B12: Vitamin B-12: 424 pg/mL (ref 232–1245)

## 2018-06-25 ENCOUNTER — Ambulatory Visit (INDEPENDENT_AMBULATORY_CARE_PROVIDER_SITE_OTHER): Payer: Self-pay | Admitting: Family Medicine

## 2018-06-25 ENCOUNTER — Encounter: Payer: Self-pay | Admitting: Family Medicine

## 2018-06-25 VITALS — BP 104/66 | HR 90 | Temp 98.2°F | Ht 64.0 in | Wt 189.0 lb

## 2018-06-25 DIAGNOSIS — J029 Acute pharyngitis, unspecified: Secondary | ICD-10-CM

## 2018-06-25 DIAGNOSIS — E559 Vitamin D deficiency, unspecified: Secondary | ICD-10-CM

## 2018-06-25 DIAGNOSIS — Z09 Encounter for follow-up examination after completed treatment for conditions other than malignant neoplasm: Secondary | ICD-10-CM

## 2018-06-25 DIAGNOSIS — R059 Cough, unspecified: Secondary | ICD-10-CM

## 2018-06-25 DIAGNOSIS — R05 Cough: Secondary | ICD-10-CM

## 2018-06-25 DIAGNOSIS — R0981 Nasal congestion: Secondary | ICD-10-CM

## 2018-06-25 DIAGNOSIS — F419 Anxiety disorder, unspecified: Secondary | ICD-10-CM

## 2018-06-25 MED ORDER — AMOXICILLIN-POT CLAVULANATE 875-125 MG PO TABS: 1.0000 | ORAL_TABLET | Freq: Two times a day (BID) | ORAL | 0 refills | Status: AC

## 2018-06-25 MED ORDER — VITAMIN D (ERGOCALCIFEROL) 1.25 MG (50000 UNIT) PO CAPS: 50000.0000 [IU] | ORAL_CAPSULE | ORAL | 3 refills | Status: DC

## 2018-06-25 MED ORDER — GUAIFENESIN ER 600 MG PO TB12: 600.0000 mg | ORAL_TABLET | Freq: Two times a day (BID) | ORAL | 1 refills | Status: DC

## 2018-06-25 NOTE — Patient Instructions (Addendum)
Vitamin D Deficiency Vitamin D deficiency is when your body does not have enough vitamin D. Vitamin D is important because:  It helps your body use other minerals that your body needs.  It helps keep your bones strong and healthy.  It may help to prevent some diseases.  It helps your heart and other muscles work well.  You can get vitamin D by:  Eating foods with vitamin D in them.  Drinking or eating milk or other foods that have had vitamin D added to them.  Taking a vitamin D supplement.  Being in the sun.  Not getting enough vitamin D can make your bones become soft. It can also cause other health problems. Follow these instructions at home:  Take medicines and supplements only as told by your doctor.  Eat foods that have vitamin D. These include: ? Dairy products, cereals, or juices with added vitamin D. Check the label for vitamin D. ? Fatty fish like salmon or trout. ? Eggs. ? Oysters.  Do not use tanning beds.  Stay at a healthy weight. Lose weight, if needed.  Keep all follow-up visits as told by your doctor. This is important. Contact a doctor if:  Your symptoms do not go away.  You feel sick to your stomach (nauseous).  Youthrow up (vomit).  You poop less often than usual or you have trouble pooping (constipation). This information is not intended to replace advice given to you by your health care provider. Make sure you discuss any questions you have with your health care provider. Document Released: 08/18/2011 Document Revised: 02/04/2016 Document Reviewed: 01/14/2015 Elsevier Interactive Patient Education  2018 [employer]. Vitamin D Deficiency Vitamin D deficiency is when your body does not have enough vitamin D. Vitamin D is important because:  It helps your body use other minerals that your body needs.  It helps keep your bones strong and healthy.  It may help to prevent some diseases.  It helps your heart and other muscles work  well.  You can get vitamin D by:  Eating foods with vitamin D in them.  Drinking or eating milk or other foods that have had vitamin D added to them.  Taking a vitamin D supplement.  Being in the sun.  Not getting enough vitamin D can make your bones become soft. It can also cause other health problems. Follow these instructions at home:  Take medicines and supplements only as told by your doctor.  Eat foods that have vitamin D. These include: ? Dairy products, cereals, or juices with added vitamin D. Check the label for vitamin D. ? Fatty fish like salmon or trout. ? Eggs. ? Oysters.  Do not use tanning beds.  Stay at a healthy weight. Lose weight, if needed.  Keep all follow-up visits as told by your doctor. This is important. Contact a doctor if:  Your symptoms do not go away.  You feel sick to your stomach (nauseous).  Youthrow up (vomit).  You poop less often than usual or you have trouble pooping (constipation). This information is not intended to replace advice given to you by your health care provider. Make sure you discuss any questions you have with your health care provider. Document Released: 08/18/2011 Document Revised: 02/04/2016 Document Reviewed: 01/14/2015 Elsevier Interactive Patient Education  2018 [employer]     Guaifenesin oral ER tablets What is this medicine? GUAIFENESIN (gwye FEN e sin) is an expectorant. It helps to thin mucous and make coughs more productive. This  medicine is used to treat coughs caused by colds or the flu. It is not intended to treat chronic cough caused by smoking, asthma, emphysema, or heart failure. This medicine may be used for other purposes; ask your health care provider or pharmacist if you have questions. COMMON BRAND NAME(S): Humibid, Mucinex What should I tell my health care provider before I take this medicine? They need to know if you have any of these conditions: -fever -kidney disease -an unusual or  allergic reaction to guaifenesin, other medicines, foods, dyes, or preservatives -pregnant or trying to get pregnant -breast-feeding How should I use this medicine? Take this medicine by mouth with a full glass of water. Follow the directions on the prescription label. Do not break, chew or crush this medicine. You may take with food or on an empty stomach. Take your medicine at regular intervals. Do not take your medicine more often than directed. Talk to your pediatrician regarding the use of this medicine in children. While this drug may be prescribed for children as young as 25 years old for selected conditions, precautions do apply. Overdosage: If you think you have taken too much of this medicine contact a poison control center or emergency room at once. NOTE: This medicine is only for you. Do not share this medicine with others. What if I miss a dose? If you miss a dose, take it as soon as you can. If it is almost time for your next dose, take only that dose. Do not take double or extra doses. What may interact with this medicine? Interactions are not expected. This list may not describe all possible interactions. Give your health care provider a list of all the medicines, herbs, non-prescription drugs, or dietary supplements you use. Also tell them if you smoke, drink alcohol, or use illegal drugs. Some items may interact with your medicine. What should I watch for while using this medicine? Do not treat a cough for more than 1 week without consulting your doctor or health care professional. If you also have a high fever, skin rash, continuing headache, or sore throat, see your doctor. For best results, drink 6 to 8 glasses water daily while you are taking this medicine. What side effects may I notice from receiving this medicine? Side effects that you should report to your doctor or health care professional as soon as possible: -allergic reactions like skin rash, itching or hives, swelling  of the face, lips, or tongue Side effects that usually do not require medical attention (report to your doctor or health care professional if they continue or are bothersome): -dizziness -headache -stomach upset This list may not describe all possible side effects. Call your doctor for medical advice about side effects. You may report side effects to FDA at 1-800-FDA-1088. Where should I keep my medicine? Keep out of the reach of children. Store at room temperature between 20 and 25 degrees C (68 and 77 degrees F). Keep container tightly closed. Throw away any unused medicine after the expiration date. NOTE: This sheet is a summary. It may not cover all possible information. If you have questions about this medicine, talk to your doctor, pharmacist, or health care provider.  2018 Elsevier/Gold Standard (2008-01-09 12:14:14)    Amoxicillin; Clavulanic Acid tablets What is this medicine? AMOXICILLIN; CLAVULANIC ACID (a mox i SIL in; KLAV yoo lan ic AS id) is a penicillin antibiotic. It is used to treat certain kinds of bacterial infections. It will not work for colds, flu, or other  viral infections. This medicine may be used for other purposes; ask your health care provider or pharmacist if you have questions. COMMON BRAND NAME(S): Augmentin What should I tell my health care provider before I take this medicine? They need to know if you have any of these conditions: -bowel disease, like colitis -kidney disease -liver disease -mononucleosis -an unusual or allergic reaction to amoxicillin, penicillin, cephalosporin, other antibiotics, clavulanic acid, other medicines, foods, dyes, or preservatives -pregnant or trying to get pregnant -breast-feeding How should I use this medicine? Take this medicine by mouth with a full glass of water. Follow the directions on the prescription label. Take at the start of a meal. Do not crush or chew. If the tablet has a score line, you may cut it in half at  the score line for easier swallowing. Take your medicine at regular intervals. Do not take your medicine more often than directed. Take all of your medicine as directed even if you think you are better. Do not skip doses or stop your medicine early. Talk to your pediatrician regarding the use of this medicine in children. Special care may be needed. Overdosage: If you think you have taken too much of this medicine contact a poison control center or emergency room at once. NOTE: This medicine is only for you. Do not share this medicine with others. What if I miss a dose? If you miss a dose, take it as soon as you can. If it is almost time for your next dose, take only that dose. Do not take double or extra doses. What may interact with this medicine? -allopurinol -anticoagulants -birth control pills -methotrexate -probenecid This list may not describe all possible interactions. Give your health care provider a list of all the medicines, herbs, non-prescription drugs, or dietary supplements you use. Also tell them if you smoke, drink alcohol, or use illegal drugs. Some items may interact with your medicine. What should I watch for while using this medicine? Tell your doctor or health care professional if your symptoms do not improve. Do not treat diarrhea with over the counter products. Contact your doctor if you have diarrhea that lasts more than 2 days or if it is severe and watery. If you have diabetes, you may get a false-positive result for sugar in your urine. Check with your doctor or health care professional. Birth control pills may not work properly while you are taking this medicine. Talk to your doctor about using an extra method of birth control. What side effects may I notice from receiving this medicine? Side effects that you should report to your doctor or health care professional as soon as possible: -allergic reactions like skin rash, itching or hives, swelling of the face, lips,  or tongue -breathing problems -dark urine -fever or chills, sore throat -redness, blistering, peeling or loosening of the skin, including inside the mouth -seizures -trouble passing urine or change in the amount of urine -unusual bleeding, bruising -unusually weak or tired -white patches or sores in the mouth or throat Side effects that usually do not require medical attention (report to your doctor or health care professional if they continue or are bothersome): -diarrhea -dizziness -headache -nausea, vomiting -stomach upset -vaginal or anal irritation This list may not describe all possible side effects. Call your doctor for medical advice about side effects. You may report side effects to FDA at 1-800-FDA-1088. Where should I keep my medicine? Keep out of the reach of children. Store at room temperature below 25 degrees  C (77 degrees F). Keep container tightly closed. Throw away any unused medicine after the expiration date. NOTE: This sheet is a summary. It may not cover all possible information. If you have questions about this medicine, talk to your doctor, pharmacist, or health care provider.  2018 Elsevier/Gold Standard (2007-11-22 12:04:30)

## 2018-06-25 NOTE — Progress Notes (Signed)
Sick Visit  Subjective:    Patient ID: Sabrina [name], female    DOB: 05/19/1966, 52 y.o.   MRN: [id number]   Chief Complaint  Patient presents with  . Cough  . Sore Throat   HPI  Sabrina [name] is a 52 year old female with a past medical history of Staph Infection, Anxiety, PONV, Osteomyelitis, Muscle Weakness of Lower Extremity, Cat Bite, bullous Pemphigus, and Arthritis. He is her today for Sick Visit.    Current Status: Since her last office visit, she has had sinus congestion, and cough for about 2 weeks. She has begun to have sore throat for the last few days. She states that she has been taking Delsym with no relief. She is staying well hydrated.   She denies fevers, chills, fatigue, recent infections, weight loss, and night sweats. She has not had any headaches, visual changes, dizziness, and falls. No chest pain, heart palpitations, cough and shortness of breath reported. No reports of GI problems such as nausea, vomiting, diarrhea, and constipation. She has no reports of blood in stools, dysuria and hematuria. No depression or anxiety reported. She denies pain today.   Review of Systems  Constitutional: Negative.   HENT: Positive for congestion (sinus) and sore throat.   Respiratory: Positive for cough (frequent) and shortness of breath.   Cardiovascular: Negative.   Gastrointestinal: Negative.   Genitourinary: Negative.   Musculoskeletal: Negative.   Allergic/Immunologic: Negative.   Neurological: Negative.   Psychiatric/Behavioral: Negative.    Objective:   Physical Exam  Constitutional: She appears well-developed and well-nourished.  HENT:  Mouth/Throat: Mucous membranes are normal. Tonsils are 1+ on the right. Tonsils are 1+ on the left. Tonsillar exudate (erythema, swelling).  Neck: Normal range of motion. Neck supple.  Cardiovascular: Normal rate and regular rhythm.  Pulmonary/Chest: Effort normal and breath sounds normal.  Abdominal: Soft. Bowel sounds are normal.   Skin: Skin is warm and dry.  Nursing note and vitals reviewed.  Assessment & Plan:   1. Throat infection - amoxicillin-clavulanate (AUGMENTIN) 875-125 MG tablet; Take 1 tablet by mouth 2 (two) times daily for 7 days.  Dispense: 14 tablet; Refill: 0  2. Complaint of nasal congestion - guaiFENesin (MUCINEX) 600 MG 12 hr tablet; Take 1 tablet (600 mg total) by mouth 2 (two) times daily.  Dispense: 30 tablet; Refill: 1  3. Cough We will initiate Mucinex today to help relieve symptoms.  - guaiFENesin (MUCINEX) 600 MG 12 hr tablet; Take 1 tablet (600 mg total) by mouth 2 (two) times daily.  Dispense: 30 tablet; Refill: 1  4. Vitamin D deficiency Refill: - Vitamin D, Ergocalciferol, (DRISDOL) 50000 units CAPS capsule; Take 1 capsule (50,000 Units total) by mouth every 7 (seven) days.  Dispense: 5 capsule; Refill: 3  5. Anxiety Stable. Continue Zoloft as prescribed.   6. Follow up She will keep follow up appointment on 12/17/2017.  Meds ordered this encounter  Medications  . Vitamin D, Ergocalciferol, (DRISDOL) 50000 units CAPS capsule    Sig: Take 1 capsule (50,000 Units total) by mouth every 7 (seven) days.    Dispense:  5 capsule    Refill:  3  . guaiFENesin (MUCINEX) 600 MG 12 hr tablet    Sig: Take 1 tablet (600 mg total) by mouth 2 (two) times daily.    Dispense:  30 tablet    Refill:  1  . amoxicillin-clavulanate (AUGMENTIN) 875-125 MG tablet    Sig: Take 1 tablet by mouth 2 (two) times daily for  7 days.    Dispense:  14 tablet    Refill:  0   Sabrina Ip,  MSN, FNP-C Patient [facility] [address] [place], [place] [address] [phone]

## 2018-06-26 ENCOUNTER — Other Ambulatory Visit: Payer: Self-pay | Admitting: Family Medicine

## 2018-06-27 ENCOUNTER — Telehealth: Payer: Self-pay

## 2018-06-27 NOTE — Telephone Encounter (Signed)
-----   Message from [name], FNP sent at 06/26/2018 10:20 PM EDT ----- Regarding: "Lab Results" Sabrina [name],   Please inform patient that he has a low Vitamin D level. We recommend that he takes a daily Vitamin D supplement of 50,000 IUs weekly to increase vitamin d levels. Rx sent to pharmacy today.   All other labs are stable.     Thank you.

## 2018-06-27 NOTE — Telephone Encounter (Signed)
Left a vm for patient to callback 

## 2018-06-29 MED FILL — clonazePAM 0.5 MG TABS: 0.5 | 30 days supply | Qty: 60 | Fill #1

## 2018-07-02 ENCOUNTER — Telehealth: Payer: Self-pay

## 2018-07-02 NOTE — Telephone Encounter (Signed)
Patient notified and will pick up medication  

## 2018-07-02 NOTE — Telephone Encounter (Signed)
-----   Message from Natalie M Stroud, FNP sent at 06/26/2018 10:20 PM EDT ----- Regarding: "Lab Results" Carrie,   Please inform patient that he has a low Vitamin D level. We recommend that he takes a daily Vitamin D supplement of 50,000 IUs weekly to increase vitamin d levels. Rx sent to pharmacy today.   All other labs are stable.     Thank you.     

## 2018-07-05 NOTE — Telephone Encounter (Signed)
Patient states that her cough isn't any better and would like something sent in . It's really bad at night time. Patient is aware that you are out of office until Tuesday.

## 2018-07-06 ENCOUNTER — Other Ambulatory Visit: Payer: Self-pay | Admitting: Family Medicine

## 2018-07-06 DIAGNOSIS — R059 Cough, unspecified: Secondary | ICD-10-CM

## 2018-07-06 DIAGNOSIS — R05 Cough: Secondary | ICD-10-CM

## 2018-07-06 MED ORDER — HYDROCODONE-HOMATROPINE 5-1.5 MG/5ML PO SYRP: 5.0000 mL | ORAL_SOLUTION | Freq: Four times a day (QID) | ORAL | 0 refills | Status: DC | PRN

## 2018-07-06 MED FILL — HYDROCODONE-HOMATROPINE SOL: 5-1.5 | 6 days supply | Qty: 120 | Fill #0

## 2018-07-06 NOTE — Progress Notes (Signed)
Patient continues to have increasing cough. She reports undesirable side effects from [employer]. We will send Hycodan Syrup to pharmacy today. 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

## 2018-07-10 ENCOUNTER — Ambulatory Visit (INDEPENDENT_AMBULATORY_CARE_PROVIDER_SITE_OTHER): Payer: Self-pay | Admitting: Licensed Clinical Social Worker

## 2018-07-10 DIAGNOSIS — F332 Major depressive disorder, recurrent severe without psychotic features: Secondary | ICD-10-CM

## 2018-07-10 NOTE — Progress Notes (Signed)
Pt did not show or call for appointment. 

## 2018-07-11 ENCOUNTER — Other Ambulatory Visit: Payer: Self-pay

## 2018-07-11 DIAGNOSIS — R059 Cough, unspecified: Secondary | ICD-10-CM

## 2018-07-11 DIAGNOSIS — R05 Cough: Secondary | ICD-10-CM

## 2018-07-12 ENCOUNTER — Ambulatory Visit (HOSPITAL_COMMUNITY)
Admission: EM | Admit: 2018-07-12 | Discharge: 2018-07-12 | Disposition: A | Discharge status: Home / Self Care | Payer: Self-pay | Attending: Family Medicine | Admitting: Family Medicine

## 2018-07-12 ENCOUNTER — Ambulatory Visit (INDEPENDENT_AMBULATORY_CARE_PROVIDER_SITE_OTHER): Payer: Self-pay

## 2018-07-12 ENCOUNTER — Encounter (HOSPITAL_COMMUNITY): Payer: Self-pay | Admitting: Emergency Medicine

## 2018-07-12 DIAGNOSIS — J22 Unspecified acute lower respiratory infection: Secondary | ICD-10-CM

## 2018-07-12 MED ORDER — PREDNISONE 20 MG PO TABS: 40.0000 mg | ORAL_TABLET | Freq: Every day | ORAL | 0 refills | Status: AC

## 2018-07-12 MED ORDER — AZITHROMYCIN 250 MG PO TABS: ORAL_TABLET | ORAL | 0 refills | Status: AC

## 2018-07-12 NOTE — ED Triage Notes (Signed)
Pt seen at pcp last week for cough, given tessalon and hycodan. Pt states shes still coughing.

## 2018-07-12 NOTE — Discharge Instructions (Signed)
Due to longevity of cough I am going to try a different antibiotic with different coverage.  Complete course of antibiotics.   5 days of prednisone, I am hopeful that this will further help with your cough.  Continue with over the counter treatments- cough syrups, decongestants, as needed for symptoms.  Push fluids to ensure adequate hydration and keep secretions thin.  Tylenol and/or ibuprofen as needed for pain or fevers.  If symptoms worsen or do not improve in the next week to return to be seen or to follow up with your PCP.

## 2018-07-12 NOTE — ED Provider Notes (Signed)
[facility]    CSN: [id number] Arrival date & time: 07/12/18  4098     History   Chief Complaint Chief Complaint  Patient presents with  . Cough    HPI Sabrina [name] is a 52 y.o. female.   Sabrina [name] presents with complaints of worsening cough. Saw her PCP 10/14 and was prescribed augmentin for tonsillitis, had only mild cough at that time. She didn't notice much improvement and was then provided hycodan as mucinex and delsym were also not helping with her symptoms. She feels her cough has worsened and yesterday started feeling achy. No fevers. No rash. No gi/gu complaints. Still with sore throat. No ear pain. Noted red lesions to her throat. No shortness of breath but does experience coughing fits where she has a hard time catching her breath. Cough is productive. No asthma history, doesn't smoke. Hx of arthritis, bullous pemphigus, anxiety depression.    ROS per HPI.      Past Medical History:  Diagnosis Date  . Arthritis    "knees" (09/06/2017)  . Bullous pemphigus   . Cat bite 06/2014   to left elbow  . History of blood transfusion 1988   "when I had my baby"  . Muscle weakness of lower extremity 2001; 09/05/2017   "resolved after a couple weeks; ?" (09/06/2017)  . 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 Poisoning, snake bite 04/08/2016   "[place]; RUE"  . PONV (postoperative nausea and vomiting)   . Situational anxiety   . Staph infection ~ 2015   "left elbow and finger"    Patient Active Problem List   Diagnosis Date Noted  . Weakness 09/06/2017  . Depression 09/06/2017  . 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  . Generalized anxiety disorder with panic attacks 09/06/2017  . Right hand pain   . Wrist swelling   . Paresthesia   . Abscess of finger   . Cellulitis of right upper extremity 04/11/2016  . Cellulitis 04/11/2016  . Snake bite   . Medical non-compliance 10/05/2015  . Elbow pain 07/20/2015  . Anxiety 07/20/2015  . Wound infection 07/04/2015  .  Dehydration 07/04/2015  . Tachycardia 07/04/2015  . Diarrhea 07/04/2015  . Wound infection after surgery 07/04/2015  . 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HCC)   . Cat bite of forearm 07/01/2015  . Skin ulcer of upper arm, limited to breakdown of skin (HCC) 07/01/2015  . Cellulitis of upper arm 03/10/2015    Past Surgical History:  Procedure Laterality Date  . APPENDECTOMY  ~ 1987  . APPLICATION OF A-CELL OF EXTREMITY Left 08/05/2015   Procedure: APPLICATION OF A-CELL OF EXTREMITY;  Surgeon: Peggye Form, DO;  Location: [facility];  Service: Plastics;  Laterality: Left;  . BREAST SURGERY Right 1990   "milk duct taken out"  . DEBRIDEMENT AND CLOSURE WOUND Left 07/01/2015   Procedure: LEFT ELBOW EXCISION OF WOUND WITH PRIMARY CLOSURE 2X5 CM ;  Surgeon: Peggye Form, DO;  Location: [facility];  Service: Plastics;  Laterality: Left;  . ELBOW SURGERY Left X 23 in [place] <06/2015   from a cat bite; all I&D  . I&D EXTREMITY Left 07/08/2015   Procedure: IRRIGATION AND DEBRIDEMENT EXTREMITY, DRAINAGE OF LEFT ARM WOUND, A-CELL PLACEMENT, WOUND VAC PLACEMENT;  Surgeon: Alena Bills Dillingham, DO;  Location: [facility];  Service: Plastics;  Laterality: Left;  . INCISION AND DRAINAGE OF WOUND Left 08/05/2015   Procedure: IRRIGATION AND DEBRIDEMENT LEFT ELBOW WOUND, PLACEMENT OF ACELL;  Surgeon: Alan Ripper  S Dillingham, DO;  Location: [facility];  Service: Plastics;  Laterality: Left;  . LAPAROSCOPIC CHOLECYSTECTOMY  1998  . SKIN GRAFT Left 2016   took from anterior thigh; placed at elbow  . TONSILLECTOMY  ~ 2000  . TOTAL ABDOMINAL HYSTERECTOMY  2003    OB History   None      Home Medications    Prior to Admission medications   Medication Sig Start Date End Date Taking? Authorizing Provider  acetaminophen (TYLENOL) 325 MG tablet Take 650 mg by mouth every 6 (six) hours as needed.    [provider]  azithromycin (ZITHROMAX) 250 MG tablet Take 2  tablets (500 mg total) by mouth daily for 1 day, THEN 1 tablet (250 mg total) daily for 4 days. 07/12/18 07/17/18  Georgetta Haber, NP  clonazePAM (KLONOPIN) 0.5 MG tablet 1  qhs    1  prn 06/01/18   Plovsky, Earvin Hansen, MD  gabapentin (NEURONTIN) 300 MG capsule TAKE 2 CAPSULES (600 MG TOTAL) BY MOUTH 3 TIMES DAILY. 06/18/18   [name], FNP  guaiFENesin (MUCINEX) 600 MG 12 hr tablet Take 1 tablet (600 mg total) by mouth 2 (two) times daily. 06/25/18   [name], FNP  HYDROcodone-acetaminophen (NORCO) 10-325 MG tablet hydrocodone 10 mg-acetaminophen 325 mg tablet  Take 1 tablet every day by oral route for 15 days.    [provider]  HYDROcodone-homatropine (HYCODAN) 5-1.5 MG/5ML syrup Take 5 mLs by mouth every 6 (six) hours as needed for cough. 07/06/18   [name], FNP  predniSONE (DELTASONE) 20 MG tablet Take 2 tablets (40 mg total) by mouth daily with breakfast for 5 days. 07/12/18 07/17/18  Georgetta Haber, NP  sertraline (ZOLOFT) 50 MG tablet 2  qam 06/12/18   Plovsky, Earvin Hansen, MD  Vitamin D, Ergocalciferol, (DRISDOL) 50000 units CAPS capsule Take 1 capsule (50,000 Units total) by mouth every 7 (seven) days. 06/25/18   [name], FNP    Family History Family History  Problem Relation Age of Onset  . Liver disease Mother   . Dementia Mother   . Cancer Mother   . Cancer Maternal Grandmother     Social History Social History   Tobacco Use  . Smoking status: Never Smoker  . Smokeless tobacco: Never Used  Substance Use Topics  . Alcohol use: No    Alcohol/week: 0.0 standard drinks    Comment: 09/06/2017 "I'll have a glass of wine a couple times/year; maybe"  . Drug use: No     Allergies   Zofran [ondansetron hcl]; Droperidol; Flexeril [cyclobenzaprine]; Morphine and related; and Tessalon [benzonatate]   Review of Systems Review of Systems   Physical Exam Triage Vital Signs ED Triage Vitals  Enc Vitals Group     BP 07/12/18 1020 95/69      Pulse Rate 07/12/18 1020 (!) 106     Resp 07/12/18 1020 18     Temp 07/12/18 1020 97.9 F (36.6 C)     Temp src --      SpO2 07/12/18 1020 96 %     Weight --      Height --      Head Circumference --      Peak Flow --      Pain Score 07/12/18 1023 4     Pain Loc --      Pain Edu? --      Excl. in GC? --    No data found.  Updated  Vital Signs BP 95/69   Pulse (!) 106   Temp 97.9 F (36.6 C)   Resp 18   SpO2 96%    Physical Exam  Constitutional: She is oriented to person, place, and time. She appears well-developed and well-nourished. No distress.  HENT:  Head: Normocephalic and atraumatic.  Right Ear: Tympanic membrane, external ear and ear canal normal.  Left Ear: Tympanic membrane, external ear and ear canal normal.  Nose: Nose normal. Right sinus exhibits no maxillary sinus tenderness and no frontal sinus tenderness. Left sinus exhibits no maxillary sinus tenderness and no frontal sinus tenderness.  Mouth/Throat: Uvula is midline, oropharynx is clear and moist and mucous membranes are normal. No tonsillar exudate.    Two red circular lesions to posterior oropharynx  Eyes: Pupils are equal, round, and reactive to light. Conjunctivae and EOM are normal.  Cardiovascular: Normal rate, regular rhythm and normal heart sounds.  Pulmonary/Chest: Effort normal and breath sounds normal.  Strong dry cough noted, rare  Neurological: She is alert and oriented to person, place, and time.  Skin: Skin is warm and dry.     UC Treatments / Results  Labs (all labs ordered are listed, but only abnormal results are displayed) Labs Reviewed - No data to display  EKG None  Radiology Dg Chest 2 View  Result Date: 07/12/2018 CLINICAL DATA:  Cough and laryngitis. EXAM: CHEST - 2 VIEW COMPARISON:  April 11, 2016 FINDINGS: Lungs are clear. The heart size and pulmonary vascularity are normal. No adenopathy. No bone lesions. IMPRESSION: No edema or consolidation. Electronically Signed    By: Bretta Bang III M.D.   On: 07/12/2018 11:22    Procedures Procedures (including critical care time)  Medications Ordered in UC Medications - No data to display  Initial Impression / Assessment and Plan / UC Course  I have reviewed the triage vital signs and the nursing notes.  Pertinent labs & imaging results that were available during my care of the patient were reviewed by me and considered in my medical decision making (see chart for details).      non toxic. No tachypnea. O2 at 96%. Mild tachycardia on initial presentation. Persistent cough. Did complete course of augmentin. No fevers but recently cough worsened and feels more achy. Shortness of breath  With cough flares. Chest xray reassuring, will cover empirically for atypicals with azithromycin. 5 days of prednisone to treat any bronchitic symptoms. Continue with supportive cares, push fluids. Return precautions provided. If symptoms worsen or do not improve in the next week to return to be seen or to follow up with PCP. Marland Kitchen Patient verbalized understanding and agreeable to plan.   Final Clinical Impressions(s) / UC Diagnoses   Final diagnoses:  Lower respiratory infection     Discharge Instructions     Due to longevity of cough I am going to try a different antibiotic with different coverage.  Complete course of antibiotics.   5 days of prednisone, I am hopeful that this will further help with your cough.  Continue with over the counter treatments- cough syrups, decongestants, as needed for symptoms.  Push fluids to ensure adequate hydration and keep secretions thin.  Tylenol and/or ibuprofen as needed for pain or fevers.  If symptoms worsen or do not improve in the next week to return to be seen or to follow up with your PCP.      ED Prescriptions    Medication Sig Dispense Auth. Provider   azithromycin (ZITHROMAX) 250 MG tablet  Take 2 tablets (500 mg total) by mouth daily for 1 day, THEN 1 tablet (250 mg  total) daily for 4 days. 6 tablet Linus Mako B, NP   predniSONE (DELTASONE) 20 MG tablet Take 2 tablets (40 mg total) by mouth daily with breakfast for 5 days. 10 tablet Georgetta Haber, NP     Controlled Substance Prescriptions [place] Controlled Substance Registry consulted? Not Applicable   Georgetta Haber, NP 07/12/18 1151

## 2018-07-13 ENCOUNTER — Ambulatory Visit: Payer: Self-pay | Admitting: Family Medicine

## 2018-07-16 MED FILL — GABAPENTIN 300 MG CAPSULE: 300 | 30 days supply | Qty: 180 | Fill #1

## 2018-07-24 ENCOUNTER — Ambulatory Visit (INDEPENDENT_AMBULATORY_CARE_PROVIDER_SITE_OTHER): Payer: Self-pay | Admitting: Psychiatry

## 2018-07-24 ENCOUNTER — Ambulatory Visit: Payer: Self-pay | Admitting: Family Medicine

## 2018-07-24 ENCOUNTER — Encounter (HOSPITAL_COMMUNITY): Payer: Self-pay | Admitting: Psychiatry

## 2018-07-24 VITALS — BP 110/78 | Ht 64.0 in | Wt 183.0 lb

## 2018-07-24 DIAGNOSIS — F410 Panic disorder [episodic paroxysmal anxiety] without agoraphobia: Secondary | ICD-10-CM

## 2018-07-24 MED ORDER — CLONAZEPAM 0.5 MG PO TABS: ORAL_TABLET | ORAL | 3 refills | Status: DC

## 2018-07-24 MED ORDER — SERTRALINE HCL 50 MG PO TABS: ORAL_TABLET | ORAL | 4 refills | Status: DC

## 2018-07-24 MED FILL — SERTRALINE HCL 50 MG TABLET: 50 | 30 days supply | Qty: 30 | Fill #0

## 2018-07-24 NOTE — Progress Notes (Signed)
[facility] MD/PA/NP OP Progress Note  07/24/2018 2:04 PM Sabrina [name]  MRN:  [id number]  Chief Complaint: anxiety HPI:  Today the patient is doing very well.  Her anxiety is significantly reduced.  Her chronic anxiety is reduced and her acute anxiety is also reduced.  She is not had any panic attacks.  The patient denies daily depression.  She is sleeping and eating well.  She takes Zoloft 50 mg a day and Klonopin 0.5 mg at night and as needed in the morning.  Her appetite is great her energy level is good.  She is back to work 25 hours.  She did get Workmen's Comp. for an injury to her wrist.  Patient lost her job.  She gets along with most people at work involves her boss.  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  Patient has 1 daughter and 2 grandchildren.  They live in [place].  They are doing well.  She plans to see them through the holidays.  The patient denies the use of alcohol or drugs.  She is focused organized and stable.  Financially she is doing well.  The patient is functioning very well.  She denies any physical complaints at this time.  She denies any anticipatory anxiety or avoidant behavior. No diagnosis found.  Past Psychiatric History: See intake H&P for full details. Reviewed, with no updates at this time.   Past Medical History:  Past Medical History:  Diagnosis Date  . Arthritis    "knees" (09/06/2017)  . Bullous pemphigus   . Cat bite 06/2014   to left elbow  . History of blood transfusion 1988   "when I had my baby"  . Muscle weakness of lower extremity 2001; 09/05/2017   "resolved after a couple weeks; ?" (09/06/2017)  . 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 Poisoning, snake bite 04/08/2016   "[place]; RUE"  . PONV (postoperative nausea and vomiting)   . Situational anxiety   . Staph infection ~ 2015   "left elbow and finger"    Past Surgical History:  Procedure Laterality Date  . APPENDECTOMY  ~ 1987  . APPLICATION OF A-CELL OF EXTREMITY Left 08/05/2015    Procedure: APPLICATION OF A-CELL OF EXTREMITY;  Surgeon: Peggye Formlaire S Dillingham, DO;  Location: [facility];  Service: Plastics;  Laterality: Left;  . BREAST SURGERY Right 1990   "milk duct taken out"  . DEBRIDEMENT AND CLOSURE WOUND Left 07/01/2015   Procedure: LEFT ELBOW EXCISION OF WOUND WITH PRIMARY CLOSURE 2X5 CM ;  Surgeon: Peggye Formlaire S Dillingham, DO;  Location: [facility];  Service: Plastics;  Laterality: Left;  . ELBOW SURGERY Left X 23 in [place] <06/2015   from a cat bite; all I&D  . I&D EXTREMITY Left 07/08/2015   Procedure: IRRIGATION AND DEBRIDEMENT EXTREMITY, DRAINAGE OF LEFT ARM WOUND, A-CELL PLACEMENT, WOUND VAC PLACEMENT;  Surgeon: Alena Billslaire S Dillingham, DO;  Location: [facility];  Service: Plastics;  Laterality: Left;  . INCISION AND DRAINAGE OF WOUND Left 08/05/2015   Procedure: IRRIGATION AND DEBRIDEMENT LEFT ELBOW WOUND, PLACEMENT OF ACELL;  Surgeon: Peggye Formlaire S Dillingham, DO;  Location: [facility];  Service: Plastics;  Laterality: Left;  . LAPAROSCOPIC CHOLECYSTECTOMY  1998  . SKIN GRAFT Left 2016   took from anterior thigh; placed at elbow  . TONSILLECTOMY  ~ 2000  . TOTAL ABDOMINAL HYSTERECTOMY  2003    Family Psychiatric History: See intake H&P for full details. Reviewed, with no updates at  this time.   Family History:  Family History  Problem Relation Age of Onset  . Liver disease Mother   . Dementia Mother   . Cancer Mother   . Cancer Maternal Grandmother     Social History:  Social History   Socioeconomic History  . Marital status: Divorced    Spouse name: Not on file  . Number of children: Not on file  . Years of education: Not on file  . Highest education level: Not on file  Occupational History  . Not on file  Social Needs  . Financial resource strain: Not on file  . Food insecurity:    Worry: Not on file    Inability: Not on file  . Transportation needs:    Medical: Not on file    Non-medical: Not on file   Tobacco Use  . Smoking status: Never Smoker  . Smokeless tobacco: Never Used  Substance and Sexual Activity  . Alcohol use: No    Alcohol/week: 0.0 standard drinks    Comment: 09/06/2017 "I'll have a glass of wine a couple times/year; maybe"  . Drug use: No  . Sexual activity: Not Currently  Lifestyle  . Physical activity:    Days per week: Not on file    Minutes per session: Not on file  . Stress: Not on file  Relationships  . Social connections:    Talks on phone: Not on file    Gets together: Not on file    Attends religious service: Not on file    Active member of club or organization: Not on file    Attends meetings of clubs or organizations: Not on file    Relationship status: Not on file  Other Topics Concern  . Not on file  Social History Narrative  . Not on file    Allergies:  Allergies  Allergen Reactions  . Zofran [Ondansetron Hcl] Hives and Other (See Comments)    Spots around iv site  . Droperidol Palpitations  . Flexeril [Cyclobenzaprine] Anxiety  . Morphine And Related Itching  . Tessalon [Benzonatate] Other (See Comments)    Watery eyes    Metabolic Disorder Labs: Lab Results  Component Value Date   HGBA1C 5.2 06/18/2018   MPG  07/05/2015    QUESTIONABLE IDENTIFICATION / INCORRECTLY LABELED SPECIMEN   No results found for: PROLACTIN Lab Results  Component Value Date   CHOL 209 (H) 06/18/2018   TRIG 71 06/18/2018   HDL 73 06/18/2018   CHOLHDL 2.9 06/18/2018   VLDL 29 04/27/2015   LDLCALC 122 (H) 06/18/2018   LDLCALC 138 (H) 04/27/2015   Lab Results  Component Value Date   TSH 2.890 06/18/2018   TSH  07/05/2015    QUESTIONABLE IDENTIFICATION / INCORRECTLY LABELED SPECIMEN    Therapeutic Level Labs: No results found for: LITHIUM No results found for: VALPROATE No components found for:  CBMZ  Current Medications: Current Outpatient Medications  Medication Sig Dispense Refill  . acetaminophen (TYLENOL) 325 MG tablet Take 650 mg by  mouth every 6 (six) hours as needed.    . clonazePAM (KLONOPIN) 0.5 MG tablet 1  qhs    1  prn 60 tablet 3  . gabapentin (NEURONTIN) 300 MG capsule TAKE 2 CAPSULES (600 MG TOTAL) BY MOUTH 3 TIMES DAILY. 180 capsule 3  . HYDROcodone-acetaminophen (NORCO) 10-325 MG tablet hydrocodone 10 mg-acetaminophen 325 mg tablet  Take 1 tablet every day by oral route for 15 days.    Marland Kitchen sertraline (ZOLOFT) 50  MG tablet 1  qam 30 tablet 4  . guaiFENesin (MUCINEX) 600 MG 12 hr tablet Take 1 tablet (600 mg total) by mouth 2 (two) times daily. (Patient not taking: Reported on 07/24/2018) 30 tablet 1   No current facility-administered medications for this visit.      Musculoskeletal: Strength & Muscle Tone: within normal limits Gait & Station: normal Patient leans: N/A  Psychiatric Specialty Exam: ROS  Blood pressure 110/78, height 5\' 4"  (1.626 m), weight 183 lb (83 kg).Body mass index is 31.41 kg/m.  General Appearance: Casual and Well Groomed  Eye Contact:  Good  Speech:  Clear and Coherent  Volume:  Normal  Mood:  Euthymic  Affect:  Congruent  Thought Process:  Goal Directed and Descriptions of Associations: Intact  Orientation:  Full (Time, Place, and Person)  Thought Content: Logical   Suicidal Thoughts:  No  Homicidal Thoughts:  No  Memory:  Immediate;   Fair  Judgement:  Fair  Insight:  Fair  Psychomotor Activity:  Normal  Concentration:  Concentration: Good  Recall:  Good  Fund of Knowledge: Good  Language: Good  Akathisia:  Negative  Handed:  Right  AIMS (if indicated): not done  Assets:  Communication Skills Desire for Improvement Housing Transportation  ADL's:  Intact  Cognition: WNL  Sleep:  Fair   Screenings: PHQ2-9     Office Visit from 06/25/2018 in [facility] Office Visit from 06/18/2018 in [facility] Office Visit from 08/28/2017 in [facility] Office Visit from 05/22/2017 in [facility]  Office Visit from 04/21/2017 in [facility]  PHQ-2 Total Score  0  0  0  0  3  PHQ-9 Total Score  -  -  -  -  11       Assessment and Plan:   At this time the patient is #1 problem is panic disorder.  I believe at this time it is nearly resolved.  She takes a low-dose of Zoloft 50 mg and Klonopin 0.5 mg which helps her panic and also helps her sleep.  She takes an extra 25 mg now and then in the beginning of the day.  The patient is not in therapy.  The patient is doing very well.  She is positive and optimistic and is functioning very well.  Her plans are to go back full-time at work.  The patient is not suicidal and going to work without problems. No diagnosis found.  Status of current problems: gradually improving  Labs Ordered: No orders of the defined types were placed in this encounter.   Labs Reviewed: n/a  Collateral Obtained/Records Reviewed: n/a  Plan:  Continue Lexapro 20 mg daily Increase Seroquel to 200 mg nightly Return to clinic in 3-4 months, transfer care to Dr. Liz Beach, MD 07/24/2018, 2:04 PM

## 2018-07-25 ENCOUNTER — Ambulatory Visit (HOSPITAL_COMMUNITY): Payer: Self-pay | Admitting: Licensed Clinical Social Worker

## 2018-07-30 MED FILL — clonazePAM 0.5 MG TABS: 0.5 | 30 days supply | Qty: 60 | Fill #2

## 2018-07-31 ENCOUNTER — Encounter (INDEPENDENT_AMBULATORY_CARE_PROVIDER_SITE_OTHER): Payer: Self-pay | Admitting: Orthopaedic Surgery

## 2018-07-31 ENCOUNTER — Ambulatory Visit (INDEPENDENT_AMBULATORY_CARE_PROVIDER_SITE_OTHER): Payer: Self-pay

## 2018-07-31 ENCOUNTER — Ambulatory Visit (INDEPENDENT_AMBULATORY_CARE_PROVIDER_SITE_OTHER): Payer: Self-pay | Admitting: Orthopaedic Surgery

## 2018-07-31 VITALS — BP 84/68 | HR 96 | Ht 64.0 in | Wt 189.0 lb

## 2018-07-31 DIAGNOSIS — G8929 Other chronic pain: Secondary | ICD-10-CM

## 2018-07-31 DIAGNOSIS — M25561 Pain in right knee: Secondary | ICD-10-CM

## 2018-07-31 MED ORDER — LIDOCAINE HCL 1 % IJ SOLN
2.0000 mL | INTRAMUSCULAR | Status: AC | PRN
  Administered 2018-07-31: 2 mL

## 2018-07-31 MED ORDER — BUPIVACAINE HCL 0.5 % IJ SOLN
2.0000 mL | INTRAMUSCULAR | Status: AC | PRN
  Administered 2018-07-31: 2 mL via INTRA_ARTICULAR

## 2018-07-31 MED ORDER — METHYLPREDNISOLONE ACETATE 40 MG/ML IJ SUSP
80.0000 mg | INTRAMUSCULAR | Status: AC | PRN
  Administered 2018-07-31: 80 mg

## 2018-07-31 MED ORDER — OXYCODONE-ACETAMINOPHEN 5-325 MG PO TABS: 1.0000 | ORAL_TABLET | Freq: Three times a day (TID) | ORAL | 0 refills | Status: DC | PRN

## 2018-07-31 MED FILL — OXYCODONE-ACETAMINOPHEN 5-3: 5-325 | 10 days supply | Qty: 30 | Fill #0

## 2018-07-31 NOTE — Progress Notes (Signed)
Office Visit Note   Patient: Sabrina [name]           Date of Birth: 08/09/1966           MRN: [id number] Visit Date: 07/31/2018              Requested by: [name], FNP [address] [place], [place] [address] PCP: [name], FNP   Assessment & Plan: Visit Diagnoses:  1. Chronic pain of right knee     Plan: End-stage osteoarthritis right knee-chronic pain.  Will inject the knee with cortisone and monitor response.  Check again in 2 weeks.  Prescription for oxycodone without further refills.  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   Orders:  Orders Placed This Encounter  Procedures  . Large Joint Inj: R knee  . XR KNEE 3 VIEW RIGHT   Meds ordered this encounter  Medications  . oxyCODONE-acetaminophen (PERCOCET/ROXICET) 5-325 MG tablet    Sig: Take 1 tablet by mouth every 8 (eight) hours as needed for severe pain.    Dispense:  30 tablet    Refill:  0      Procedures: Large Joint Inj: R knee on 07/31/2018 12:32 PM Indications: pain and diagnostic evaluation Details: 25 G 1.5 in needle, anteromedial approach  Arthrogram: No  Medications: 2 mL lidocaine 1 %; 2 mL bupivacaine 0.5 %; 80 mg methylPREDNISolone acetate 40 MG/ML Procedure, treatment alternatives, risks and benefits explained, specific risks discussed. Consent was given by the patient. Immediately prior to procedure a time out was called to verify the correct patient, procedure, equipment, support staff and site/side marked as required. Patient was prepped and draped in the usual sterile fashion.       Clinical Data: No additional findings.   Subjective: Chief Complaint  Patient presents with  . New Patient (Initial Visit)    R KNEE PAIN FROM HER TEENS HAD SURGERY TO REMOVE FLOATING CARTILAGE. 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 TRIED BRACE WHICH DOSENT WORK, PAIN CREAMS AND PATCHES WITH NO RELIEF  Sabrina [name] is 52 years old and has a chronic history of right knee pain.  She notes that she had an injury in the 1990s and eventually had arthroscopy.  She is not exactly sure of the procedure but that she had at least some "loose cartilage".  Over the years she has had persistent pain to the point recently that she has had compromise of her ability to work and sleep.  She is having pain mostly along the medial compartment of her knee.  She is had some swelling.  She is tried several over-the-counter medicines including NSAIDs and Tylenol.  She is had a brace.  She is also had a Workmen's Comp. injury that was not related to her knee and has had tramadol and hydrocodone without much relief of her pain.  As I review her chart I see that she is had several emergency room visits with a concern for drug-seeking.  She continues to work.  She is also fully aware that she may placement.  She is had pain both with and without weightbearing  HPI  Review of Systems  Constitutional: Negative for fatigue and fever.  HENT: Negative for ear pain.   Eyes: Negative for pain.  Respiratory: Negative for cough and shortness of breath.  Cardiovascular: Positive for leg swelling.  Gastrointestinal: Negative for constipation and diarrhea.  Genitourinary: Negative for difficulty urinating.  Musculoskeletal: Negative for back pain and neck pain.  Skin: Negative for rash.  Allergic/Immunologic: Negative for food allergies.  Neurological: Positive for weakness. Negative for numbness.  Hematological: Bruises/bleeds easily.  Psychiatric/Behavioral: Positive for sleep disturbance.     Objective: Vital Signs: BP (!) 84/68 (BP Location: Right Arm, Patient Position: Sitting, Cuff Size: Normal)   Pulse 96   Ht 5\' 4"  (1.626 m)   Wt 189 lb (85.7 kg)   BMI 32.44 kg/m   Physical Exam  Constitutional: She is oriented to person, place,  and time. She appears well-developed and well-nourished.  HENT:  Mouth/Throat: Oropharynx is clear and moist.  Eyes: Pupils are equal, round, and reactive to light. EOM are normal.  Pulmonary/Chest: Effort normal.  Neurological: She is alert and oriented to person, place, and time.  Skin: Skin is warm and dry.  Psychiatric: She has a normal mood and affect. Her behavior is normal.    Ortho Exam awake alert and oriented x3.  Comfortable sitting.  Lacks about 10 degrees to full right knee extension and flexed about 98 degrees.  No instability.  Increased varus.  Some patellar crepitation.  No calf pain.  No popliteal pain.  Neurologically intact.  Straight leg raise negative.  Painless range of motion right hip  Specialty Comments:  No specialty comments available.  Imaging: Xr Knee 3 View Right  Result Date: 07/31/2018 Films of the right knee repayment 3 projections standing.  There are end-stage osteoarthritic changes.  There is about 5 degrees of varus with bone-on-bone in the medial compartment associated with subchondral sclerosis.  There are peripheral osteophytes in all 3 compartments    PMFS History: Patient Active Problem List   Diagnosis Date Noted  . Weakness 09/06/2017  . Depression 09/06/2017  . 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  . Generalized anxiety disorder with panic attacks 09/06/2017  . Right hand pain   . Wrist swelling   . Paresthesia   . Abscess of finger   . Cellulitis of right upper extremity 04/11/2016  . Cellulitis 04/11/2016  . Snake bite   . Medical non-compliance 10/05/2015  . Elbow pain 07/20/2015  . Anxiety 07/20/2015  . Wound infection 07/04/2015  . Dehydration 07/04/2015  . Tachycardia 07/04/2015  . Diarrhea 07/04/2015  . Wound infection after surgery 07/04/2015  . 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HCC)   . Cat bite of forearm 07/01/2015  . Skin ulcer of upper arm, limited to breakdown of skin ([facility]) 07/01/2015  . Cellulitis of upper arm 03/10/2015    Past Medical History:  Diagnosis Date  . Arthritis    "knees" (09/06/2017)  . Bullous pemphigus   . Cat bite 06/2014   to left elbow  . History of blood transfusion 1988   "when I had my baby"  . Muscle weakness of lower extremity 2001; 09/05/2017   "resolved after a couple weeks; ?" (09/06/2017)  . 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 Poisoning, snake bite 04/08/2016   "[place]; RUE"  . PONV (postoperative nausea and vomiting)   . Situational anxiety   . Staph infection ~ 2015   "left elbow and finger"    Family History  Problem Relation Age of Onset  . Liver disease Mother   . Dementia Mother   . Cancer Mother   . Cancer Maternal Grandmother     Past Surgical History:  Procedure Laterality Date  . APPENDECTOMY  ~  1987  . APPLICATION OF A-CELL OF EXTREMITY Left 08/05/2015   Procedure: APPLICATION OF A-CELL OF EXTREMITY;  Surgeon: Peggye Form, DO;  Location: [facility];  Service: Plastics;  Laterality: Left;  . BREAST SURGERY Right 1990   "milk duct taken out"  . DEBRIDEMENT AND CLOSURE WOUND Left 07/01/2015   Procedure: LEFT ELBOW EXCISION OF WOUND WITH PRIMARY CLOSURE 2X5 CM ;  Surgeon: Peggye Form, DO;  Location: [facility];  Service: Plastics;  Laterality: Left;  . ELBOW SURGERY Left X 23 in [place] <06/2015   from a cat bite; all I&D  . I&D EXTREMITY Left 07/08/2015   Procedure: IRRIGATION AND DEBRIDEMENT EXTREMITY, DRAINAGE OF LEFT ARM WOUND, A-CELL PLACEMENT, WOUND VAC PLACEMENT;  Surgeon: Alena Bills Dillingham, DO;  Location: [facility];  Service: Plastics;  Laterality: Left;  . INCISION AND DRAINAGE OF WOUND Left 08/05/2015   Procedure: IRRIGATION AND DEBRIDEMENT LEFT ELBOW WOUND, PLACEMENT OF ACELL;  Surgeon: Peggye Form, DO;  Location: [facility];  Service: Plastics;  Laterality: Left;  . LAPAROSCOPIC CHOLECYSTECTOMY  1998  . SKIN GRAFT Left 2016   took from anterior thigh; placed at elbow  .  TONSILLECTOMY  ~ 2000  . TOTAL ABDOMINAL HYSTERECTOMY  2003   Social History   Occupational History  . Not on file  Tobacco Use  . Smoking status: Never Smoker  . Smokeless tobacco: Never Used  Substance and Sexual Activity  . Alcohol use: No    Alcohol/week: 0.0 standard drinks    Comment: 09/06/2017 "I'll have a glass of wine a couple times/year; maybe"  . Drug use: No  . Sexual activity: Not Currently      [facility]

## 2018-08-02 ENCOUNTER — Other Ambulatory Visit (INDEPENDENT_AMBULATORY_CARE_PROVIDER_SITE_OTHER): Payer: Self-pay | Admitting: *Deleted

## 2018-08-02 ENCOUNTER — Telehealth (INDEPENDENT_AMBULATORY_CARE_PROVIDER_SITE_OTHER): Payer: Self-pay | Admitting: Orthopaedic Surgery

## 2018-08-02 DIAGNOSIS — G8929 Other chronic pain: Secondary | ICD-10-CM

## 2018-08-02 DIAGNOSIS — M25561 Pain in right knee: Principal | ICD-10-CM

## 2018-08-02 NOTE — Telephone Encounter (Signed)
Order MRI right knee w/o contrast?

## 2018-08-02 NOTE — Telephone Encounter (Signed)
yes

## 2018-08-02 NOTE — Telephone Encounter (Signed)
LMOM, order for Right knee MRI placed

## 2018-08-02 NOTE — Telephone Encounter (Signed)
Patient called stating she had a cortisone injection in her right knee on 11/19 and feels like "it has not helped at all."  Patient states she was walking yesterday and her knee "gave out" which she states has never happened before.  Patient states she was also given pain medication, but this has not helped relieve the pain.  Patient states she is not sure if this is a normal response from a cortisone injection and requesting a return call.

## 2018-08-06 MED FILL — GABAPENTIN 300 MG CAPSULE: 300 | 30 days supply | Qty: 180 | Fill #2

## 2018-08-07 ENCOUNTER — Ambulatory Visit (INDEPENDENT_AMBULATORY_CARE_PROVIDER_SITE_OTHER): Payer: Self-pay | Admitting: Orthopaedic Surgery

## 2018-08-13 ENCOUNTER — Telehealth (INDEPENDENT_AMBULATORY_CARE_PROVIDER_SITE_OTHER): Payer: Self-pay | Admitting: Orthopaedic Surgery

## 2018-08-13 ENCOUNTER — Other Ambulatory Visit: Payer: Self-pay

## 2018-08-13 MED FILL — clonazePAM 0.5 MG TABS: 0.5 | 15 days supply | Qty: 30 | Fill #0

## 2018-08-13 MED FILL — SERTRALINE HCL 50 MG TABLET: 50 | 15 days supply | Qty: 15 | Fill #0

## 2018-08-13 NOTE — Telephone Encounter (Signed)
Please advise 

## 2018-08-13 NOTE — Telephone Encounter (Signed)
No more oxycodone until after MRI

## 2018-08-13 NOTE — Telephone Encounter (Signed)
LMOM RF for oxycodone denied until MRI results received

## 2018-08-13 NOTE — Telephone Encounter (Signed)
Patient called requesting prescription refill of Oxycodone to be sent to [facility] on [address].  Patient states she has not been able to schedule her MRI because she is waiting for her 100% coverage letter.  Patient states the pain medication has helped at night.  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 Cleophas DunkerWhitfield leave a message on her phone if he will be able to refill her prescription.

## 2018-08-14 ENCOUNTER — Other Ambulatory Visit (INDEPENDENT_AMBULATORY_CARE_PROVIDER_SITE_OTHER): Payer: Self-pay | Admitting: *Deleted

## 2018-08-14 ENCOUNTER — Telehealth (INDEPENDENT_AMBULATORY_CARE_PROVIDER_SITE_OTHER): Payer: Self-pay | Admitting: Orthopaedic Surgery

## 2018-08-14 ENCOUNTER — Other Ambulatory Visit: Payer: Self-pay | Admitting: *Deleted

## 2018-08-14 MED ORDER — TRAMADOL HCL 50 MG PO TABS: ORAL_TABLET | ORAL | 0 refills | Status: DC

## 2018-08-14 MED FILL — traMADol HCL 50 MG TABS: 50 | 7 days supply | Qty: 30 | Fill #0

## 2018-08-14 NOTE — Telephone Encounter (Signed)
Patient understands doctor does not want to refill pain medication at this time. Patient is scheduled for MRI on 12/12. Patient states Ibuprofen burns her stomach. Knee pain is worse at night, and keeps her up. Patient has tried ice without much relief. Is there anything doctor can suggest she try to help. Aleve, and Tylenol does nothing in the pm.

## 2018-08-14 NOTE — Telephone Encounter (Signed)
Tramadol 50 mg #30 1-2 tabs po bid prn

## 2018-08-14 NOTE — Telephone Encounter (Signed)
Please advise 

## 2018-08-14 NOTE — Telephone Encounter (Signed)
Patient stated the last time she took morphine she had no reaction, patient advised to call office if she does have a reaction, last reaction was when she was a child. Tramadol called into [facility], I called patient.

## 2018-08-14 NOTE — Telephone Encounter (Signed)
Patient is allergic to Voltaren gel - added to chart, patient can take Tramadol - [facility] - please advise sig. Thank you.

## 2018-08-14 NOTE — Telephone Encounter (Signed)
HAS SHE TRIED VOLTAREN GEL? CAN SHE TAKE TRAMADOL?

## 2018-08-23 ENCOUNTER — Other Ambulatory Visit: Payer: Self-pay

## 2018-08-25 ENCOUNTER — Other Ambulatory Visit: Payer: Self-pay

## 2018-08-27 ENCOUNTER — Ambulatory Visit (INDEPENDENT_AMBULATORY_CARE_PROVIDER_SITE_OTHER): Payer: Self-pay | Admitting: Orthopaedic Surgery

## 2018-08-27 MED FILL — clonazePAM 0.5 MG TABS: 0.5 | 30 days supply | Qty: 60 | Fill #3

## 2018-08-28 ENCOUNTER — Telehealth (INDEPENDENT_AMBULATORY_CARE_PROVIDER_SITE_OTHER): Payer: Self-pay | Admitting: Orthopaedic Surgery

## 2018-08-28 NOTE — Telephone Encounter (Signed)
NOT NECESSARY TO SEE-SEE AFTER mri

## 2018-08-28 NOTE — Telephone Encounter (Signed)
I called patient 

## 2018-08-28 NOTE — Telephone Encounter (Signed)
Please advise 

## 2018-08-28 NOTE — Telephone Encounter (Signed)
Patient called stating last night her right knee buckled on her and she fell to the ground.  Patient states it happened two more times today.   Patient states she is scheduled for an MRI on Thursday, 08/30/18 and is not sure if Dr. Cleophas DunkerWhitfield needs to see her before her MRI.  Patient requested a return call.

## 2018-08-30 ENCOUNTER — Telehealth (HOSPITAL_COMMUNITY): Payer: Self-pay

## 2018-08-30 ENCOUNTER — Ambulatory Visit
Admission: RE | Admit: 2018-08-30 | Discharge: 2018-08-30 | Disposition: A | Discharge status: Home / Self Care | Payer: Self-pay | Source: Ambulatory Visit | Attending: Orthopaedic Surgery | Admitting: Orthopaedic Surgery

## 2018-08-30 ENCOUNTER — Telehealth (INDEPENDENT_AMBULATORY_CARE_PROVIDER_SITE_OTHER): Payer: Self-pay | Admitting: Orthopaedic Surgery

## 2018-08-30 DIAGNOSIS — M25561 Pain in right knee: Principal | ICD-10-CM

## 2018-08-30 DIAGNOSIS — G8929 Other chronic pain: Secondary | ICD-10-CM

## 2018-08-30 NOTE — Telephone Encounter (Signed)
Patient called in tears saying that she received a phone call and her father is very sick and they are not expecting him to make it, and she just currently lost her job. She is having panic attacks, she's not sleeping she wants to know what can she do to help with all that she has going on right. Patient doesn't have suicidal ideations. Please advise

## 2018-08-30 NOTE — Telephone Encounter (Signed)
MRI report is available, patient has been schedule to come in 08/31/2018 at 8:00am for results.

## 2018-08-30 NOTE — Telephone Encounter (Signed)
Patient calling again to let you know she is still having a lot of pain with knee. Tramadol not helping, brace making it worse. Patient did not know if there was anything else she could do until she gets her MRI results. Please call to advise.

## 2018-08-31 ENCOUNTER — Telehealth (INDEPENDENT_AMBULATORY_CARE_PROVIDER_SITE_OTHER): Payer: Self-pay | Admitting: Orthopaedic Surgery

## 2018-08-31 ENCOUNTER — Other Ambulatory Visit: Payer: Self-pay

## 2018-08-31 ENCOUNTER — Telehealth: Payer: Self-pay

## 2018-08-31 ENCOUNTER — Encounter (INDEPENDENT_AMBULATORY_CARE_PROVIDER_SITE_OTHER): Payer: Self-pay | Admitting: Orthopaedic Surgery

## 2018-08-31 ENCOUNTER — Ambulatory Visit (INDEPENDENT_AMBULATORY_CARE_PROVIDER_SITE_OTHER): Payer: Self-pay | Admitting: Orthopaedic Surgery

## 2018-08-31 ENCOUNTER — Other Ambulatory Visit (INDEPENDENT_AMBULATORY_CARE_PROVIDER_SITE_OTHER): Payer: Self-pay | Admitting: *Deleted

## 2018-08-31 DIAGNOSIS — G609 Hereditary and idiopathic neuropathy, unspecified: Secondary | ICD-10-CM

## 2018-08-31 DIAGNOSIS — G8929 Other chronic pain: Secondary | ICD-10-CM

## 2018-08-31 DIAGNOSIS — M25561 Pain in right knee: Secondary | ICD-10-CM

## 2018-08-31 MED ORDER — GABAPENTIN 300 MG PO CAPS: ORAL_CAPSULE | ORAL | 0 refills | Status: DC

## 2018-08-31 MED ORDER — TRAMADOL HCL 50 MG PO TABS: ORAL_TABLET | ORAL | 0 refills | Status: DC

## 2018-08-31 MED FILL — traMADol HCL 50 MG TABS: 50 | 5 days supply | Qty: 20 | Fill #0

## 2018-08-31 NOTE — Telephone Encounter (Signed)
Patient called back on 08/31/18 and spoke with Regan A

## 2018-08-31 NOTE — Telephone Encounter (Signed)
Patient states CBD oil does not help. She is in a lot of pain, and she cannot eat, or sleep. Can doctor call her in something for pain. Please call to advise.

## 2018-08-31 NOTE — Telephone Encounter (Signed)
Patient states that she had a MRI done that at it showed possible Myeloma/Lukemia of tibia/fibula and that the ortho doctor would like you to look at her last labs and see is you see anything that is concerning or if there is any other labs you would like to run.

## 2018-08-31 NOTE — Telephone Encounter (Signed)
Medication sent to pharmacy  

## 2018-08-31 NOTE — Telephone Encounter (Signed)
RX for Tramadol refilled per Jerral RalphBrian, QTY changed to 20, I called patient

## 2018-08-31 NOTE — Telephone Encounter (Signed)
Patient scheduled for 09/10/2018.

## 2018-08-31 NOTE — Progress Notes (Signed)
Office Visit Note   Patient: Sabrina [name]           Date of Birth: 03/14/1966           MRN: [id number] Visit Date: 08/31/2018              Requested by: Azzie Glatter, [place], [place] [address] PCP: Azzie Glatter, FNP   Assessment & Plan: Visit Diagnoses:  1. Chronic pain of right knee     Plan: MRI scan of right knee demonstrates a complex tear of the medial meniscus associated with significant degenerative changes in the medial compartment.  There are degenerative changes of the patellofemoral and lateral compartment as well.  There was also an area of marrow edema in the proximal tibia that could be a stress reaction reaction.  The radiologist also listed as a differential diagnosis the possibility of some infiltrative process.  Mrs. Guyette had lab studies in October that were basically normal including a CBC and C met.  I have discussed this with her and asked her to check with her family physician. We had a long discussion regarding the MRI scan findings and different treatment options.  She might want to try Voltaren gel which we can order.  Another option would be an arthroscopic debridement of her knee.  She is fully aware per our discussion that that procedure may or may not provide any significant relief of her pain because of the arthritis.  At age 52 I am hesitant to proceed directly to a knee replacement and I discussed that with her as well  Follow-Up Instructions: Return if symptoms worsen or fail to improve.   Orders:  No orders of the defined types were placed in this encounter.  No orders of the defined types were placed in this encounter.     Procedures: No procedures performed   Clinical Data: No additional findings.   Subjective: No chief complaint on file. No change in symptoms referable to her right knee.  Still having pain predominantly medial compartment.  Occasionally has some popping clicking and catching.  Family  history is positive for some arthritis.  Mrs. Chalk is been having trouble for many years without a specific injury or trauma.  At one point she had gained considerable weight to over 200 pounds which may or may not have contributed to the exacerbation of her arthritic symptoms.  She is in the midst of losing weight and trying to exercise. HPI  Review of Systems   Objective: Vital Signs: There were no vitals taken for this visit.  Physical Exam Constitutional:      Appearance: She is well-developed.  Eyes:     Pupils: Pupils are equal, round, and reactive to light.  Pulmonary:     Effort: Pulmonary effort is normal.  Skin:    General: Skin is warm and dry.  Neurological:     Mental Status: She is alert and oriented to person, place, and time.  Psychiatric:        Behavior: Behavior normal.     Ortho Exam right knee may have had a very small effusion.  Predominant pain is along the medial compartment.  There was very minimal loss of full extension and flexion probably 100 202 degrees.  No instability.  No calf pain or popliteal pain or mass.  Neurologically intact  Specialty Comments:  No specialty comments available.  Imaging: Mr Knee Right W/o Contrast  Result Date: 08/30/2018 CLINICAL DATA:  Chronic right knee pain EXAM: MRI OF THE RIGHT KNEE WITHOUT CONTRAST TECHNIQUE: Multiplanar, multisequence MR imaging of the knee was performed. No intravenous contrast was administered. COMPARISON:  None. FINDINGS: MENISCI Medial meniscus: Complex tear of the posterior horn of the medial meniscus with a radial component and peripheral meniscal extrusion. Lateral meniscus: Degeneration of the anterior horn of the lateral meniscus with maceration along the root. LIGAMENTS Cruciates: Intact ACL. ACL is expanded and increased in signal consistent with mucinous degeneration. Intact PCL. Collaterals: Medial collateral ligament is intact. Lateral collateral ligament complex is intact. CARTILAGE  Patellofemoral: Mild partial-thickness cartilage loss of the patellofemoral compartment with marginal osteophytes. Medial: Extensive full-thickness cartilage loss of the medial femoral condyle and medial tibial plateau with marginal osteophytes. Lateral: Partial-thickness cartilage loss of the periphery of the lateral femorotibial compartment with marginal osteophytes. Joint: Large joint effusion. Edema in Hoffa's fat. No plical thickening. Popliteal Fossa:  No Baker cyst. Intact popliteus tendon. Extensor Mechanism: Intact quadriceps tendon. Intact patellar tendon. Intact medial patellar retinaculum. Intact lateral patellar retinaculum. Intact MPFL. Bones: No fracture or dislocation. Subcortical marrow edema in proximal mid anterior tibial epiphysis just anterior to the ACL insertion extending into the metaphysis and proximal diaphysis which is of indeterminate etiology. Other: No  fluid collection or hematoma.  Muscles are normal. IMPRESSION: 1. Tricompartmental cartilage abnormalities as described above most severe in the medial femorotibial compartment. 2. Complex tear of the posterior horn of the medial meniscus with a radial component and peripheral meniscal extrusion. 3. Degeneration of the anterior horn of the lateral meniscus with maceration along the root. 4. Subcortical marrow edema in proximal mid anterior tibial epiphysis just anterior to the ACL insertion extending into the metaphysis and proximal diaphysis which is of indeterminate etiology. This may reflect stress reaction versus sequela healed fracture versus less likely an infiltrative process such as myeloma/leukemia. A follow-up MRI of right tibia/fibula is recommended in 3 months for re-evaluation. Electronically Signed   By: Kathreen Devoid   On: 08/30/2018 14:48     PMFS History: Patient Active Problem List   Diagnosis Date Noted  . Weakness 09/06/2017  . Depression 09/06/2017  . 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  . Generalized anxiety  disorder with panic attacks 09/06/2017  . Right hand pain   . Wrist swelling   . Paresthesia   . Abscess of finger   . Cellulitis of right upper extremity 04/11/2016  . Cellulitis 04/11/2016  . Snake bite   . Medical non-compliance 10/05/2015  . Elbow pain 07/20/2015  . Anxiety 07/20/2015  . Wound infection 07/04/2015  . Dehydration 07/04/2015  . Tachycardia 07/04/2015  . Diarrhea 07/04/2015  . Wound infection after surgery 07/04/2015  . 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[facility])   . Cat bite of forearm 07/01/2015  . Skin ulcer of upper arm, limited to breakdown of skin ([facility]) 07/01/2015  . Cellulitis of upper arm 03/10/2015   Past Medical History:  Diagnosis Date  . Arthritis    "knees" (09/06/2017)  . Bullous pemphigus   . Cat bite 06/2014   to left elbow  . History of blood transfusion 1988   "when I had my baby"  . Muscle weakness of lower extremity 2001; 09/05/2017   "resolved after a couple weeks; ?" (09/06/2017)  . 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[facility])   . Poisoning, snake bite 04/08/2016   "[place]; RUE"  . PONV (postoperative nausea and vomiting)   . Situational anxiety   . Staph infection ~ 2015   "left elbow and finger"  Family History  Problem Relation Age of Onset  . Liver disease Mother   . Dementia Mother   . Cancer Mother   . Cancer Maternal Grandmother     Past Surgical History:  Procedure Laterality Date  . APPENDECTOMY  ~ 1987  . APPLICATION OF A-CELL OF EXTREMITY Left 08/05/2015   Procedure: APPLICATION OF A-CELL OF EXTREMITY;  Surgeon: Wallace Going, DO;  Location: [facility];  Service: Plastics;  Laterality: Left;  . BREAST SURGERY Right 1990   "milk duct taken out"  . DEBRIDEMENT AND CLOSURE WOUND Left 07/01/2015   Procedure: LEFT ELBOW EXCISION OF WOUND WITH PRIMARY CLOSURE 2X5 CM ;  Surgeon: Wallace Going, DO;  Location: [facility];  Service: Plastics;  Laterality: Left;  . ELBOW SURGERY Left X 23 in [place]  <06/2015   from a cat bite; all I&D  . I&D EXTREMITY Left 07/08/2015   Procedure: IRRIGATION AND DEBRIDEMENT EXTREMITY, DRAINAGE OF LEFT ARM WOUND, A-CELL PLACEMENT, WOUND VAC PLACEMENT;  Surgeon: Loel Lofty Dillingham, DO;  Location: [facility];  Service: Plastics;  Laterality: Left;  . INCISION AND DRAINAGE OF WOUND Left 08/05/2015   Procedure: IRRIGATION AND DEBRIDEMENT LEFT ELBOW WOUND, PLACEMENT OF ACELL;  Surgeon: Wallace Going, DO;  Location: [facility];  Service: Plastics;  Laterality: Left;  . LAPAROSCOPIC CHOLECYSTECTOMY  1998  . SKIN GRAFT Left 2016   took from anterior thigh; placed at elbow  . TONSILLECTOMY  ~ 2000  . TOTAL ABDOMINAL HYSTERECTOMY  2003   Social History   Occupational History  . Not on file  Tobacco Use  . Smoking status: Never Smoker  . Smokeless tobacco: Never Used  Substance and Sexual Activity  . Alcohol use: No    Alcohol/week: 0.0 standard drinks    Comment: 09/06/2017 "I'll have a glass of wine a couple times/year; maybe"  . Drug use: No  . Sexual activity: Not Currently     Garald Balding, MD   Note - This record has been created using [employer].  Chart creation errors have been sought, but may not always  have been located. Such creation errors do not reflect on  the standard of medical care.

## 2018-09-02 MED FILL — GABAPENTIN 300 MG CAPSULE: 300 | 30 days supply | Qty: 180 | Fill #3

## 2018-09-03 ENCOUNTER — Other Ambulatory Visit (INDEPENDENT_AMBULATORY_CARE_PROVIDER_SITE_OTHER): Payer: Self-pay | Admitting: Orthopedic Surgery

## 2018-09-03 ENCOUNTER — Telehealth (INDEPENDENT_AMBULATORY_CARE_PROVIDER_SITE_OTHER): Payer: Self-pay | Admitting: Orthopaedic Surgery

## 2018-09-03 MED ORDER — HYDROCODONE-ACETAMINOPHEN 5-325 MG PO TABS: 1.0000 | ORAL_TABLET | Freq: Four times a day (QID) | ORAL | 0 refills | Status: DC | PRN

## 2018-09-03 MED FILL — HYDROCODON-APAP 5-325: 5-325 | 4 days supply | Qty: 15 | Fill #0

## 2018-09-03 NOTE — Telephone Encounter (Signed)
Please advise 

## 2018-09-03 NOTE — Telephone Encounter (Signed)
Patient left a voicemail stating the prescription of Tramadol is "not helping at all for pain relief."   Patient states her "knee cap gave out again yesterday" and is requesting something stronger.

## 2018-09-04 ENCOUNTER — Other Ambulatory Visit (HOSPITAL_COMMUNITY): Payer: Self-pay

## 2018-09-04 MED ORDER — CLONAZEPAM 1 MG PO TABS: ORAL_TABLET | ORAL | 0 refills | Status: DC

## 2018-09-04 MED FILL — clonazePAM 1 MG TABS: 1 | 30 days supply | Qty: 75 | Fill #0

## 2018-09-04 NOTE — Progress Notes (Unsigned)
Patient called in crisis, having panic attacks and not doing well. Dr. Donell BeersPlovsky ordered a change in her Klonopin. A new order was sent to the pharmacy and patient was notified.

## 2018-09-09 ENCOUNTER — Emergency Department (HOSPITAL_COMMUNITY)
Admission: EM | Admit: 2018-09-09 | Discharge: 2018-09-10 | Disposition: A | Discharge status: Home / Self Care | Payer: Self-pay | Attending: Emergency Medicine | Admitting: Emergency Medicine

## 2018-09-09 ENCOUNTER — Other Ambulatory Visit: Payer: Self-pay

## 2018-09-09 ENCOUNTER — Encounter (HOSPITAL_COMMUNITY): Payer: Self-pay

## 2018-09-09 ENCOUNTER — Emergency Department (HOSPITAL_COMMUNITY): Payer: Self-pay

## 2018-09-09 DIAGNOSIS — Z79899 Other long term (current) drug therapy: Secondary | ICD-10-CM | POA: Insufficient documentation

## 2018-09-09 DIAGNOSIS — M25561 Pain in right knee: Secondary | ICD-10-CM | POA: Insufficient documentation

## 2018-09-09 DIAGNOSIS — R29898 Other symptoms and signs involving the musculoskeletal system: Secondary | ICD-10-CM | POA: Insufficient documentation

## 2018-09-09 DIAGNOSIS — G8929 Other chronic pain: Secondary | ICD-10-CM | POA: Insufficient documentation

## 2018-09-09 NOTE — ED Notes (Signed)
Patient transported to X-ray 

## 2018-09-09 NOTE — ED Triage Notes (Signed)
Right knee swelling getting worse with swelling and increased pain and now below right knee decreased sensation.

## 2018-09-09 NOTE — ED Notes (Signed)
Patient states that she has had chronic right knee pain, but lately pain has become unbearable. She is currently being seen for her knee problems, but tonight she started having weakness and numbness/tingling in the extremity and is nervous about it. The numbness/tingling started below the knee and has worked its way up the entire extremity and has moved into the groin area.

## 2018-09-10 ENCOUNTER — Other Ambulatory Visit: Payer: Self-pay

## 2018-09-10 ENCOUNTER — Telehealth (INDEPENDENT_AMBULATORY_CARE_PROVIDER_SITE_OTHER): Payer: Self-pay | Admitting: Orthopaedic Surgery

## 2018-09-10 ENCOUNTER — Emergency Department (HOSPITAL_COMMUNITY): Payer: Self-pay

## 2018-09-10 ENCOUNTER — Ambulatory Visit: Payer: Self-pay | Admitting: Family Medicine

## 2018-09-10 ENCOUNTER — Ambulatory Visit (INDEPENDENT_AMBULATORY_CARE_PROVIDER_SITE_OTHER): Payer: Self-pay | Admitting: Orthopaedic Surgery

## 2018-09-10 LAB — BASIC METABOLIC PANEL
Anion gap: 10 (ref 5–15)
BUN: 26 mg/dL — ABNORMAL HIGH (ref 6–20)
CO2: 24 mmol/L (ref 22–32)
Calcium: 9.1 mg/dL (ref 8.9–10.3)
Chloride: 108 mmol/L (ref 98–111)
Creatinine, Ser: 0.89 mg/dL (ref 0.44–1.00)
GFR calc Af Amer: >60 mL/min (ref >60)
GFR calc non Af Amer: >60 mL/min (ref >60)
GLUCOSE: 95 mg/dL (ref 70–99)
Potassium: 3.4 mmol/L — ABNORMAL LOW (ref 3.5–5.1)
Sodium: 142 mmol/L (ref 135–145)

## 2018-09-10 LAB — CBC WITH DIFFERENTIAL/PLATELET
Abs Immature Granulocytes: 0.01 10*3/uL (ref 0.00–0.07)
Basophils Absolute: 0 10*3/uL (ref 0.0–0.1)
Basophils Relative: 0 %
Eosinophils Absolute: 0.1 10*3/uL (ref 0.0–0.5)
Eosinophils Relative: 2 %
HEMATOCRIT: 38.4 % (ref 36.0–46.0)
Hemoglobin: 12.1 g/dL (ref 12.0–15.0)
Immature Granulocytes: 0 %
Lymphocytes Relative: 42 %
Lymphs Abs: 2.3 10*3/uL (ref 0.7–4.0)
MCH: 31.3 pg (ref 26.0–34.0)
MCHC: 31.5 g/dL (ref 30.0–36.0)
MCV: 99.2 fL (ref 80.0–100.0)
Monocytes Absolute: 0.5 10*3/uL (ref 0.1–1.0)
Monocytes Relative: 9 %
Neutro Abs: 2.6 10*3/uL (ref 1.7–7.7)
Neutrophils Relative %: 47 %
Platelets: 294 10*3/uL (ref 150–400)
RBC: 3.87 MIL/uL (ref 3.87–5.11)
RDW: 13.2 % (ref 11.5–15.5)
WBC: 5.6 10*3/uL (ref 4.0–10.5)
nRBC: 0 % (ref 0.0–0.2)

## 2018-09-10 MED ORDER — GADOBUTROL 1 MMOL/ML IV SOLN
7.0000 mL | Freq: Once | INTRAVENOUS | Status: AC | PRN
  Administered 2018-09-10: 7 mL via INTRAVENOUS

## 2018-09-10 MED ORDER — PROMETHAZINE HCL 25 MG/ML IJ SOLN
25.0000 mg | Freq: Once | INTRAMUSCULAR | Status: AC
  Administered 2018-09-10: 25 mg via INTRAVENOUS
  Filled 2018-09-10: qty 1

## 2018-09-10 MED ORDER — HYDROCODONE-ACETAMINOPHEN 5-325 MG PO TABS: 1.0000 | ORAL_TABLET | ORAL | 0 refills | Status: DC | PRN

## 2018-09-10 MED ORDER — FENTANYL CITRATE (PF) 100 MCG/2ML IJ SOLN
100.0000 ug | Freq: Once | INTRAMUSCULAR | Status: AC
  Administered 2018-09-10: 100 ug via INTRAVENOUS
  Filled 2018-09-10: qty 2

## 2018-09-10 MED FILL — HYDROCODON-APAP 5-325: 5-325 | 1 days supply | Qty: 6 | Fill #0

## 2018-09-10 NOTE — Telephone Encounter (Signed)
FYI

## 2018-09-10 NOTE — Telephone Encounter (Signed)
Please advise 

## 2018-09-10 NOTE — Telephone Encounter (Signed)
called

## 2018-09-10 NOTE — ED Provider Notes (Signed)
[facility] DEPT Provider Note: Lowella DellJ. Lane Davine Coba, MD, FACEP  CSN: [id number] MRN: [id number] ARRIVAL: 09/09/18 at 2042 ROOM: WA10/WA10   CHIEF COMPLAINT  Knee Pain   HISTORY OF PRESENT ILLNESS  09/10/18 12:00 AM Sabrina [name] is a [age] y.o. female with a history of chronic right knee pain.  She is here complaining of severe pain in her right knee.  She is also stating that her right leg feels numb (paresthesias but not lately insensate) and she is unable to move her right ankle or foot and minimally able to move her right knee.  She fell yesterday morning and has bruising to her right knee.  Her right knee is also swollen.  She has a history of transverse myelitis but this usually causes of bilateral lower extremity numbness and weakness and usually causes back pain which is not present this time.  She states the pain is causing her to be nauseated and she has not eaten for 3 days.  She states she is able to get around "by holding onto things" and drove here using her left leg.    Past Medical History:  Diagnosis Date  . Arthritis    "knees" (09/06/2017)  . Bullous pemphigus   . Cat bite 06/2014   to left elbow  . History of blood transfusion 1988   "when I had my baby"  . Muscle weakness of lower extremity 2001; 09/05/2017   "resolved after a couple weeks; ?" (09/06/2017)  . 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 Poisoning, snake bite 04/08/2016   "[place]; RUE"  . PONV (postoperative nausea and vomiting)   . Situational anxiety   . Staph infection ~ 2015   "left elbow and finger"    Past Surgical History:  Procedure Laterality Date  . APPENDECTOMY  ~ 1987  . APPLICATION OF A-CELL OF EXTREMITY Left 08/05/2015   Procedure: APPLICATION OF A-CELL OF EXTREMITY;  Surgeon: Peggye Formlaire S Dillingham, DO;  Location: [facility];  Service: Plastics;  Laterality: Left;  . BREAST SURGERY Right 1990   "milk duct taken out"  . DEBRIDEMENT AND CLOSURE WOUND Left 07/01/2015   Procedure: LEFT ELBOW EXCISION OF WOUND WITH PRIMARY CLOSURE 2X5 CM ;  Surgeon: Peggye Formlaire S Dillingham, DO;  Location: [facility];  Service: Plastics;  Laterality: Left;  . ELBOW SURGERY Left X 23 in [place]06/2015   from a cat bite; all I&D  . I&D EXTREMITY Left 07/08/2015   Procedure: IRRIGATION AND DEBRIDEMENT EXTREMITY, DRAINAGE OF LEFT ARM WOUND, A-CELL PLACEMENT, WOUND VAC PLACEMENT;  Surgeon: Alena Billslaire S Dillingham, DO;  Location: [facility];  Service: Plastics;  Laterality: Left;  . INCISION AND DRAINAGE OF WOUND Left 08/05/2015   Procedure: IRRIGATION AND DEBRIDEMENT LEFT ELBOW WOUND, PLACEMENT OF ACELL;  Surgeon: Peggye Formlaire S Dillingham, DO;  Location: [facility];  Service: Plastics;  Laterality: Left;  . LAPAROSCOPIC CHOLECYSTECTOMY  1998  . SKIN GRAFT Left 2016   took from anterior thigh; placed at elbow  . TONSILLECTOMY  ~ 2000  . TOTAL ABDOMINAL HYSTERECTOMY  2003    Family History  Problem Relation Age of Onset  . Liver disease Mother   . Dementia Mother   . Cancer Mother   . Cancer Maternal Grandmother     Social History   Tobacco Use  . Smoking status: Never Smoker  . Smokeless tobacco: Never Used  Substance Use Topics  . Alcohol use: No    Alcohol/week: 0.0 standard drinks  Comment: 09/06/2017 "I'll have a glass of wine a couple times/year; maybe"  . Drug use: No    Prior to Admission medications   Medication Sig Start Date End Date Taking? Authorizing Provider  acetaminophen (TYLENOL) 325 MG tablet Take 650 mg by mouth every 6 (six) hours as needed for mild pain, moderate pain or headache.    Yes [provider]  clonazePAM (KLONOPIN) 1 MG tablet 1 tablet (1 mg) bid and 1/2 tablet (0.5 mg) daily prn Patient taking differently: Take 1 mg by mouth 2 (two) times daily. May also take 0.5 mg daily prn for anxiety 09/04/18  Yes Plovsky, Earvin Hansen, MD  gabapentin (NEURONTIN) 300 MG capsule TAKE 2 CAPSULES (600 MG TOTAL) BY MOUTH 3 TIMES  DAILY. Patient taking differently: Take 600 mg by mouth 3 (three) times daily.  08/31/18  Yes [name], FNP  sertraline (ZOLOFT) 50 MG tablet 1  qam Patient taking differently: Take 50 mg by mouth daily. 1  qam 07/24/18  Yes Plovsky, Earvin Hansen, MD  guaiFENesin (MUCINEX) 600 MG 12 hr tablet Take 1 tablet (600 mg total) by mouth 2 (two) times daily. Patient not taking: Reported on 07/24/2018 06/25/18   [name], FNP  HYDROcodone-acetaminophen (NORCO/VICODIN) 5-325 MG tablet Take 1 tablet by mouth every 6 (six) hours as needed for moderate pain. Patient not taking: Reported on 09/10/2018 09/03/18   Jetty Peeks, PA-C  oxyCODONE-acetaminophen (PERCOCET/ROXICET) 5-325 MG tablet Take 1 tablet by mouth every 8 (eight) hours as needed for severe pain. Patient not taking: Reported on 09/10/2018 07/31/18   Valeria Batman, MD  traMADol Janean Sark) 50 MG tablet Take 1-2 tabs PO BID PRN Patient not taking: Reported on 09/10/2018 08/31/18   Jetty Peeks, PA-C    Allergies Zofran Frazier Richards hcl]; Droperidol; Flexeril [cyclobenzaprine]; Morphine and related; Tessalon [benzonatate]; and Voltaren [diclofenac sodium]   REVIEW OF SYSTEMS  Negative except as noted here or in the History of Present Illness.   PHYSICAL EXAMINATION  Initial Vital Signs Blood pressure 95/63, pulse 97, temperature 98.3 F (36.8 C), temperature source Oral, resp. rate 18, height 5\' 4"  (1.626 m), weight 78 kg, SpO2 100 %.  Examination General: Well-developed, well-nourished female in no acute distress; appearance consistent with age of record HENT: normocephalic; atraumatic Eyes: pupils equal, round and reactive to light; extraocular muscles intact Neck: supple Heart: regular rate and rhythm Lungs: clear to auscultation bilaterally Abdomen: soft; nondistended; nontender; bowel sounds present Extremities: No deformity; pulses normal; swelling and ecchymosis of right knee with pain on passive range  of motion:    Neurologic: Awake, alert and oriented; no facial droop; decreased sensation and apparent paresis of right lower extremity Skin: Warm and dry Psychiatric: Labile mood, tearful at times   RESULTS  Summary of this visit's results, reviewed by myself:   EKG Interpretation  Date/Time:    Ventricular Rate:    PR Interval:    QRS Duration:   QT Interval:    QTC Calculation:   R Axis:     Text Interpretation:        Laboratory Studies: Results for orders placed or performed during the hospital encounter of 09/09/18 (from the past 24 hour(s))  CBC with Differential/Platelet     Status: None   Collection Time: 09/10/18  1:54 AM  Result Value Ref Range   WBC 5.6 4.0 - 10.5 K/uL   RBC 3.87 3.87 - 5.11 MIL/uL   Hemoglobin 12.1 12.0 - 15.0 g/dL   HCT 16.1 09.6 -  46.0 %   MCV 99.2 80.0 - 100.0 fL   MCH 31.3 26.0 - 34.0 pg   MCHC 31.5 30.0 - 36.0 g/dL   RDW 40.9 81.1 - 91.4 %   Platelets 294 150 - 400 K/uL   nRBC 0.0 0.0 - 0.2 %   Neutrophils Relative % 47 %   Neutro Abs 2.6 1.7 - 7.7 K/uL   Lymphocytes Relative 42 %   Lymphs Abs 2.3 0.7 - 4.0 K/uL   Monocytes Relative 9 %   Monocytes Absolute 0.5 0.1 - 1.0 K/uL   Eosinophils Relative 2 %   Eosinophils Absolute 0.1 0.0 - 0.5 K/uL   Basophils Relative 0 %   Basophils Absolute 0.0 0.0 - 0.1 K/uL   Immature Granulocytes 0 %   Abs Immature Granulocytes 0.01 0.00 - 0.07 K/uL  Basic metabolic panel     Status: Abnormal   Collection Time: 09/10/18  1:54 AM  Result Value Ref Range   Sodium 142 135 - 145 mmol/L   Potassium 3.4 (L) 3.5 - 5.1 mmol/L   Chloride 108 98 - 111 mmol/L   CO2 24 22 - 32 mmol/L   Glucose, Bld 95 70 - 99 mg/dL   BUN 26 (H) 6 - 20 mg/dL   Creatinine, Ser 7.82 0.44 - 1.00 mg/dL   Calcium 9.1 8.9 - 95.6 mg/dL   GFR calc non Af Amer >60 >60 mL/min   GFR calc Af Amer >60 >60 mL/min   Anion gap 10 5 - 15   Imaging Studies: Mr Thoracic Spine W Wo Contrast  Result Date: 09/10/2018 CLINICAL  DATA:  Chronic, worsening right knee pain. Right leg numbness, tingling, and weakness moving into the groin area. EXAM: MRI THORACIC AND LUMBAR SPINE WITHOUT AND WITH CONTRAST TECHNIQUE: Multiplanar and multiecho pulse sequences of the thoracic and lumbar spine were obtained without and with intravenous contrast. CONTRAST:  7 mL Gadavist COMPARISON:  09/06/2017 FINDINGS: MRI THORACIC SPINE FINDINGS Some sequences are mildly to moderately motion degraded despite repeated imaging attempts. Alignment: Normal. Vertebrae: New T8 superior endplate compression fracture with mild-to-moderate marrow edema and 10% vertebral body height loss centrally without retropulsion. No suspicious osseous lesion. Cord: Normal signal and morphology. No abnormal intradural enhancement. Paraspinal and other soft tissues: Multiple T2 hyperintense lesions are again partially visualized in the liver with cysts and hemangiomas shown on a 2015 CT. Disc levels: Up to moderate facet arthrosis in the upper and midthoracic spine. No disc herniation, spinal stenosis, or neural foraminal stenosis. MRI LUMBAR SPINE FINDINGS Segmentation:  Standard. Alignment:  Normal. Vertebrae: No fracture, suspicious osseous lesion, or significant marrow edema. Unchanged hemangioma in the L5 vertebral body. Conus medullaris: Extends to the lower L1 level and appears normal. Unremarkable appearance of the cauda equina. No abnormal enhancement. Paraspinal and other soft tissues: Unremarkable. Disc levels: Disc desiccation and mild disc space narrowing at L3-4 and L4-5. L1-2 and L2-3: Negative. L3-4: Mild disc bulging and slight facet hypertrophy without stenosis, unchanged. L4-5: Mild disc bulging, small left foraminal disc protrusion, and mild facet hypertrophy result in borderline to mild bilateral neural foraminal stenosis without spinal stenosis, unchanged. L5-S1: Normal disc.  Slight facet hypertrophy without stenosis. IMPRESSION: 1. Acute/subacute T8 compression  fracture with 10% height loss. 2. No thoracic disc herniation or stenosis. 3. Normal appearance of the thoracic spinal cord and cauda equina. 4. Unchanged mild lumbar disc and facet degeneration without compressive stenosis. Electronically Signed   By: Sebastian Ache M.D.   On: 09/10/2018 08:00  Mr Lumbar Spine W Wo Contrast  Result Date: 09/10/2018 CLINICAL DATA:  Chronic, worsening right knee pain. Right leg numbness, tingling, and weakness moving into the groin area. EXAM: MRI THORACIC AND LUMBAR SPINE WITHOUT AND WITH CONTRAST TECHNIQUE: Multiplanar and multiecho pulse sequences of the thoracic and lumbar spine were obtained without and with intravenous contrast. CONTRAST:  7 mL Gadavist COMPARISON:  09/06/2017 FINDINGS: MRI THORACIC SPINE FINDINGS Some sequences are mildly to moderately motion degraded despite repeated imaging attempts. Alignment: Normal. Vertebrae: New T8 superior endplate compression fracture with mild-to-moderate marrow edema and 10% vertebral body height loss centrally without retropulsion. No suspicious osseous lesion. Cord: Normal signal and morphology. No abnormal intradural enhancement. Paraspinal and other soft tissues: Multiple T2 hyperintense lesions are again partially visualized in the liver with cysts and hemangiomas shown on a 2015 CT. Disc levels: Up to moderate facet arthrosis in the upper and midthoracic spine. No disc herniation, spinal stenosis, or neural foraminal stenosis. MRI LUMBAR SPINE FINDINGS Segmentation:  Standard. Alignment:  Normal. Vertebrae: No fracture, suspicious osseous lesion, or significant marrow edema. Unchanged hemangioma in the L5 vertebral body. Conus medullaris: Extends to the lower L1 level and appears normal. Unremarkable appearance of the cauda equina. No abnormal enhancement. Paraspinal and other soft tissues: Unremarkable. Disc levels: Disc desiccation and mild disc space narrowing at L3-4 and L4-5. L1-2 and L2-3: Negative. L3-4: Mild disc  bulging and slight facet hypertrophy without stenosis, unchanged. L4-5: Mild disc bulging, small left foraminal disc protrusion, and mild facet hypertrophy result in borderline to mild bilateral neural foraminal stenosis without spinal stenosis, unchanged. L5-S1: Normal disc.  Slight facet hypertrophy without stenosis. IMPRESSION: 1. Acute/subacute T8 compression fracture with 10% height loss. 2. No thoracic disc herniation or stenosis. 3. Normal appearance of the thoracic spinal cord and cauda equina. 4. Unchanged mild lumbar disc and facet degeneration without compressive stenosis. Electronically Signed   By: Sebastian AcheAllen  Grady M.D.   On: 09/10/2018 08:00   Dg Knee Complete 4 Views Right  Result Date: 09/09/2018 CLINICAL DATA:  Knee pain EXAM: RIGHT KNEE - COMPLETE 4+ VIEW COMPARISON:  None. FINDINGS: Marginal osteophytosis at both femorotibial joint spaces. No acute fracture or dislocation. Joint space loss is greatest at the medial femorotibial space. There is a large superior patellar enthesophyte. No joint effusion. 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 No acute fracture or dislocation Electronically Signed   By: Deatra RobinsonKevin  Herman M.D.   On: 09/09/2018 22:51    ED COURSE and MDM  Nursing notes and initial vitals signs, including pulse oximetry, reviewed.  Vitals:   09/10/18 0300 09/10/18 0500 09/10/18 0600 09/10/18 0755  BP: (!) 91/54 (!) 88/57 (!) 84/55 94/80  Pulse: 85 83 71 87  Resp: 16 17 16 18   Temp:    98.7 F (37.1 C)  TempSrc:    Oral  SpO2: 99% 98% 100% 90%  Weight:      Height:       8:08 AM MRI is negative for spinal cord changes.  No evidence of transverse myelitis seen.  The patient's symptoms may be due to recent soft tissue knee injury causing nerve involvement.  We will have her follow-up with Dr. Cleophas DunkerWhitfield, her orthopedist.  Consultation with the [facility]  [place] substances database reveals the patient has received 91  prescriptions for controlled substances in the past 2 years from multiple prescribers, most of these were opioids with a few prescriptions for benzodiazepines and gabapentin.Marland Kitchen.  PROCEDURES    ED DIAGNOSES  ICD-10-CM   1. Chronic pain of right knee M25.561    G89.29   2. Weakness of right arm R29.898        Paula Libra, MD 09/10/18 [phone]

## 2018-09-10 NOTE — ED Notes (Signed)
Patient transported to MRI 

## 2018-09-10 NOTE — Telephone Encounter (Signed)
Patient called stating she was returning Dr. Hoy RegisterWhitfield's call.  Patient states she can be reached at [phone]

## 2018-09-10 NOTE — ED Notes (Signed)
Attempted IV access but unsuccessful.

## 2018-09-10 NOTE — Telephone Encounter (Signed)
Patient called stating she went to the ER last night because her right leg was swollen, discolored, and painful.  Patient states she was sent home with 6 pain pills and crutches with instructions to stay off of it.  Patient states she is using ice and elevating her knee and "doing everything she is suppose to do."  Patient states her foot doesn't feel right and has "decreased sensation."  Patient states she also had bloodwork but no one went over the results.  Patient is requesting a return call.

## 2018-09-11 ENCOUNTER — Telehealth (INDEPENDENT_AMBULATORY_CARE_PROVIDER_SITE_OTHER): Payer: Self-pay | Admitting: Orthopaedic Surgery

## 2018-09-11 NOTE — Telephone Encounter (Signed)
Patient left message on voicemail requesting a call to discuss surgery date.  Patient has applied for [employer] and is awaiting the letter. She will call to schedule the surgery once she has received (she was told approximately 10 days).

## 2018-09-13 ENCOUNTER — Telehealth (INDEPENDENT_AMBULATORY_CARE_PROVIDER_SITE_OTHER): Payer: Self-pay | Admitting: Orthopaedic Surgery

## 2018-09-13 ENCOUNTER — Telehealth (HOSPITAL_COMMUNITY): Payer: Self-pay

## 2018-09-13 MED FILL — VIT D2 1.25 MG (50,000 UNIT: 1.25 MG | 84 days supply | Qty: 12 | Fill #0

## 2018-09-13 NOTE — Telephone Encounter (Signed)
Patient is calling because she states the Klonopin is not working. Patient is very anxious about her MRI results and states that she was told she might have leukemia. She has been getting pain medication from her Ortho, but he recently told her no more. She said that she is in a constant state of anxiety and is unable to sleep due to worry. Please review and advise, thank you

## 2018-09-13 NOTE — Telephone Encounter (Signed)
She is not my patient.  Please contact Dr. Driscilla Grammes.

## 2018-09-13 NOTE — Telephone Encounter (Signed)
Per ER evaluation pt has received 91 rxs for meds in past 2 yrs-needs pain clinic evaluation if she is asking for additional pain meds. Also would need to return to office for further eval

## 2018-09-13 NOTE — Telephone Encounter (Signed)
Advised patient of recommendations of pain clinic eval or further eval by [name], patient declined both as of now. Patient is aware we can not rx anymore pain meds right now. Patient states she will wait until she receives 100% [employer] letter and then decide if she would like to be evaluated by a pain clinic.

## 2018-09-13 NOTE — Telephone Encounter (Signed)
Patient was last seen in the office on 08/31/2018 and was to return PRN. Tramadol has been prescribed for patient on 08/14/2018 and 08/31/2018. Patient went to the ED on 09/10/2018 and was given a hydrocodone rx for 6 tablets. Please advise.

## 2018-09-13 NOTE — Telephone Encounter (Signed)
Patient called stating her knee is very painful and is requesting a prescription of Tramadol or Hydrocodone (whatever [name] would recommend) sent to [facility].  Patient states she is waiting for the [employer] to send [facility] information they need to give her the 100% coverage letter to schedule the surgery.

## 2018-09-14 ENCOUNTER — Telehealth (INDEPENDENT_AMBULATORY_CARE_PROVIDER_SITE_OTHER): Payer: Self-pay | Admitting: Orthopaedic Surgery

## 2018-09-14 MED ORDER — CLONAZEPAM 1 MG PO TABS: ORAL_TABLET | ORAL | 0 refills | Status: DC

## 2018-09-14 MED ORDER — SERTRALINE HCL 100 MG PO TABS: ORAL_TABLET | ORAL | 2 refills | Status: DC

## 2018-09-14 MED FILL — SERTRALINE HCL 100 MG TAB: 100 | 30 days supply | Qty: 30 | Fill #0

## 2018-09-14 MED FILL — clonazePAM 1 MG TABS: 1 | 30 days supply | Qty: 90 | Fill #0

## 2018-09-14 NOTE — Telephone Encounter (Signed)
Patient called stating her MRI "showed a clean fracture in her back" and wanted to let [name] know.  Patient requested a return call.

## 2018-09-14 NOTE — Telephone Encounter (Signed)
I spoke to the patient and let her know that per Dr. Donell Beers, we are going to increase the Klonopin to 3 times a day and the Zoloft to 100 mg. Patient was agreeable to this plan and the prescriptions were called into the pharmacy.

## 2018-09-14 NOTE — Telephone Encounter (Signed)
Please call patient. Thank you.  

## 2018-09-17 ENCOUNTER — Encounter (INDEPENDENT_AMBULATORY_CARE_PROVIDER_SITE_OTHER): Payer: Self-pay | Admitting: Orthopaedic Surgery

## 2018-09-17 ENCOUNTER — Ambulatory Visit (INDEPENDENT_AMBULATORY_CARE_PROVIDER_SITE_OTHER): Payer: Self-pay | Admitting: Orthopaedic Surgery

## 2018-09-17 VITALS — BP 91/69 | HR 92 | Ht 64.0 in | Wt 172.0 lb

## 2018-09-17 DIAGNOSIS — G8929 Other chronic pain: Secondary | ICD-10-CM

## 2018-09-17 DIAGNOSIS — M25561 Pain in right knee: Secondary | ICD-10-CM

## 2018-09-17 MED ORDER — TRAMADOL HCL 50 MG PO TABS: 50.0000 mg | ORAL_TABLET | Freq: Two times a day (BID) | ORAL | 0 refills | Status: DC

## 2018-09-17 MED FILL — traMADol HCL 50 MG TABS: 50 | 15 days supply | Qty: 30 | Fill #0

## 2018-09-17 NOTE — Telephone Encounter (Signed)
Please advise 

## 2018-09-17 NOTE — Telephone Encounter (Signed)
Will see in office

## 2018-09-17 NOTE — Progress Notes (Signed)
Office Visit Note   Patient: Sabrina [name]           Date of Birth: 06-02-1966           MRN: [id number] Visit Date: 09/17/2018              Requested by: [name], FNP [address] [place], [place] [address] PCP: [name], FNP   Assessment & Plan: Visit Diagnoses:  1. Chronic pain of right knee     Plan: Persistent right knee pain.  Prior MRI scan demonstrates tricompartmental cartilage abnormalities most severe in the medial compartment as well as a complex tear of the posterior horn of the medial meniscus with a radial component and peripheral meniscal extrusion.  There was an area of subcortical marrow edema in the proximal mid anterior tibial epiphysis just anterior to the ACL that may reflect a stress reaction versus an infiltrative process.  Her lab work to date has been normal.  Needs to have pain and a feeling of instability.  Has fallen on several occasions is exacerbating her pain.  I have suggested arthroscopy as an reasonable next modality.  I think at age 53 that I would proceed with the arthroscopy before considering a knee replacement although that may be an eventual possibility.  I have discussed this in detail the past as well as today and she understands.  She is awaiting an orange card for financial help through the [name] health system and will proceed with surgery once that is received.  She fell within the past day or 2 with bruising about the right knee and recurrent effusion.  There is no evidence of instability.  She also had an MRI scan of her lumbar spine through the emergency room last week revealing a 10% compression fracture of T8 that is either acute or subacute.  Is asymptomatic. She also has a history of multiple pain pill prescriptions over the past 2 years numbering about 90.  I have discussed that with her as well.  I will give her prescription for tramadol but nothing stronger and will limit her prescriptions.  Follow-Up Instructions:  Return will schedule right knee arthroscopy when receives orange card.   Orders:  No orders of the defined types were placed in this encounter.  Meds ordered this encounter  Medications  . traMADol (ULTRAM) 50 MG tablet    Sig: Take 1 tablet (50 mg total) by mouth 2 (two) times daily.    Dispense:  30 tablet    Refill:  0      Procedures: No procedures performed   Clinical Data: No additional findings.   Subjective: Chief Complaint  Patient presents with  . Knee Pain    Pt injured Rt knee this morning--painful, swollen, popping, weakness  Per prior office notes Mrs. Hails is having chronic pain in her right knee.  She has had an MRI scan revealing degenerative changes in all 3 compartments as well as a tear of the medial meniscus.  Have suggested knee arthroscopy but she is awaiting financial help through the [facility] health system with an RNs card.  The meantime she is having trouble with her knee giving way and pain.  She has a chronic history of pain from a prior injury and has received over 90 prescriptions of pain medicines over 2-year period.  Have discussed that with her as well.  She has braces that she will wear while she awaits the surgery.  Had been seen in  the emergency room December 30 planing of knee and back pain.  An MRI scan revealed a 10% compression fracture of T8.  She is not symptomatic  HPI  Review of Systems  Constitutional: Negative.   HENT: Negative.   Eyes: Negative.   Respiratory: Negative.   Cardiovascular: Negative.   Gastrointestinal: Negative.   Endocrine: Negative.   Genitourinary: Negative.   Musculoskeletal: Positive for back pain, gait problem and myalgias.  Allergic/Immunologic: Negative.   Hematological: Negative.   Psychiatric/Behavioral: Negative.      Objective: Vital Signs: BP 91/69   Pulse 92   Ht 5\' 4"  (1.626 m)   Wt 172 lb (78 kg)   BMI 29.52 kg/m   Physical Exam Constitutional:      Appearance: She is well-developed.    Eyes:     Pupils: Pupils are equal, round, and reactive to light.  Pulmonary:     Effort: Pulmonary effort is normal.  Skin:    General: Skin is warm and dry.  Neurological:     Mental Status: She is alert and oriented to person, place, and time.  Psychiatric:        Behavior: Behavior normal.     Ortho Exam right knee with an abrasion over the lateral aspect of her knee from her fall this morning.  Appears to be very superficial.  I covered this with Band-Aid.  Recurrent effusion.  Pain predominantly on the medial compartment but also some pain laterally and some early ecchymosis laterally from her fall.  Able to fully extend her knee and flex over to instability.  No percussible tenderness of the lumbar or thoracic spine  Specialty Comments:  No specialty comments available.  Imaging: No results found.   PMFS History: Patient Active Problem List   Diagnosis Date Noted  . Weakness 09/06/2017  . Depression 09/06/2017  . 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  . Generalized anxiety disorder with panic attacks 09/06/2017  . Right hand pain   . Wrist swelling   . Paresthesia   . Abscess of finger   . Cellulitis of right upper extremity 04/11/2016  . Cellulitis 04/11/2016  . Snake bite   . Medical non-compliance 10/05/2015  . Elbow pain 07/20/2015  . Anxiety 07/20/2015  . Wound infection 07/04/2015  . Dehydration 07/04/2015  . Tachycardia 07/04/2015  . Diarrhea 07/04/2015  . Wound infection after surgery 07/04/2015  . 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HCC)   . Cat bite of forearm 07/01/2015  . Skin ulcer of upper arm, limited to breakdown of skin (HCC) 07/01/2015  . Cellulitis of upper arm 03/10/2015   Past Medical History:  Diagnosis Date  . Arthritis    "knees" (09/06/2017)  . Bullous pemphigus   . Cat bite 06/2014   to left elbow  . History of blood transfusion 1988   "when I had my baby"  . Muscle weakness of lower extremity 2001; 09/05/2017   "resolved after a couple weeks;  ?" (09/06/2017)  . 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 Poisoning, snake bite 04/08/2016   "[place]; RUE"  . PONV (postoperative nausea and vomiting)   . Situational anxiety   . Staph infection ~ 2015   "left elbow and finger"    Family History  Problem Relation Age of Onset  . Liver disease Mother   . Dementia Mother   . Cancer Mother   . Cancer Maternal Grandmother     Past Surgical History:  Procedure Laterality Date  . APPENDECTOMY  ~ 1987  . APPLICATION OF  A-CELL OF EXTREMITY Left 08/05/2015   Procedure: APPLICATION OF A-CELL OF EXTREMITY;  Surgeon: Peggye Formlaire S Dillingham, DO;  Location: [facility];  Service: Plastics;  Laterality: Left;  . BREAST SURGERY Right 1990   "milk duct taken out"  . DEBRIDEMENT AND CLOSURE WOUND Left 07/01/2015   Procedure: LEFT ELBOW EXCISION OF WOUND WITH PRIMARY CLOSURE 2X5 CM ;  Surgeon: Peggye Formlaire S Dillingham, DO;  Location: [facility];  Service: Plastics;  Laterality: Left;  . ELBOW SURGERY Left X 23 in [place] <06/2015   from a cat bite; all I&D  . I&D EXTREMITY Left 07/08/2015   Procedure: IRRIGATION AND DEBRIDEMENT EXTREMITY, DRAINAGE OF LEFT ARM WOUND, A-CELL PLACEMENT, WOUND VAC PLACEMENT;  Surgeon: Alena Billslaire S Dillingham, DO;  Location: [facility];  Service: Plastics;  Laterality: Left;  . INCISION AND DRAINAGE OF WOUND Left 08/05/2015   Procedure: IRRIGATION AND DEBRIDEMENT LEFT ELBOW WOUND, PLACEMENT OF ACELL;  Surgeon: Peggye Formlaire S Dillingham, DO;  Location: [facility];  Service: Plastics;  Laterality: Left;  . LAPAROSCOPIC CHOLECYSTECTOMY  1998  . SKIN GRAFT Left 2016   took from anterior thigh; placed at elbow  . TONSILLECTOMY  ~ 2000  . TOTAL ABDOMINAL HYSTERECTOMY  2003   Social History   Occupational History  . Not on file  Tobacco Use  . Smoking status: Never Smoker  . Smokeless tobacco: Never Used  Substance and Sexual Activity  . Alcohol use: No    Alcohol/week: 0.0 standard drinks     Comment: 09/06/2017 "I'll have a glass of wine a couple times/year; maybe"  . Drug use: No  . Sexual activity: Not Currently

## 2018-09-18 ENCOUNTER — Ambulatory Visit (INDEPENDENT_AMBULATORY_CARE_PROVIDER_SITE_OTHER): Payer: Self-pay | Admitting: Orthopaedic Surgery

## 2018-09-20 ENCOUNTER — Telehealth (INDEPENDENT_AMBULATORY_CARE_PROVIDER_SITE_OTHER): Payer: Self-pay | Admitting: Orthopaedic Surgery

## 2018-09-20 NOTE — Telephone Encounter (Signed)
Patient calling to let you know she feels like there is something in her lt knee since she feel. Per patient it is poking her. She has tried to pick it off with no results. Please call to advise.

## 2018-09-20 NOTE — Telephone Encounter (Signed)
Please advise 

## 2018-09-20 NOTE — Telephone Encounter (Signed)
Will need to see next week if she has problem with left knee

## 2018-09-20 NOTE — Telephone Encounter (Signed)
LMOM for patient to call office to schedule appt w/[name]

## 2018-09-21 ENCOUNTER — Encounter: Payer: Self-pay | Admitting: Family Medicine

## 2018-09-21 ENCOUNTER — Ambulatory Visit (INDEPENDENT_AMBULATORY_CARE_PROVIDER_SITE_OTHER): Payer: Self-pay | Admitting: Family Medicine

## 2018-09-21 VITALS — BP 98/66 | HR 100 | Temp 97.9°F | Ht 64.0 in | Wt 180.0 lb

## 2018-09-21 DIAGNOSIS — R51 Headache: Secondary | ICD-10-CM

## 2018-09-21 DIAGNOSIS — Z09 Encounter for follow-up examination after completed treatment for conditions other than malignant neoplasm: Secondary | ICD-10-CM

## 2018-09-21 DIAGNOSIS — R110 Nausea: Secondary | ICD-10-CM

## 2018-09-21 DIAGNOSIS — R519 Headache, unspecified: Secondary | ICD-10-CM

## 2018-09-21 DIAGNOSIS — F410 Panic disorder [episodic paroxysmal anxiety] without agoraphobia: Secondary | ICD-10-CM

## 2018-09-21 DIAGNOSIS — G8929 Other chronic pain: Secondary | ICD-10-CM

## 2018-09-21 DIAGNOSIS — M25561 Pain in right knee: Secondary | ICD-10-CM

## 2018-09-21 DIAGNOSIS — F411 Generalized anxiety disorder: Secondary | ICD-10-CM

## 2018-09-21 MED ORDER — PROCHLORPERAZINE MALEATE 10 MG PO TABS: 10.0000 mg | ORAL_TABLET | Freq: Four times a day (QID) | ORAL | 2 refills | Status: DC | PRN

## 2018-09-21 MED ORDER — BUTALBITAL-APAP-CAFFEINE 50-325-40 MG PO TABS: 1.0000 | ORAL_TABLET | Freq: Four times a day (QID) | ORAL | 0 refills | Status: DC | PRN

## 2018-09-21 MED FILL — BUTALB-ACETAMIN-CAFF 50-325: 50-325-40 | 2 days supply | Qty: 20 | Fill #0

## 2018-09-21 MED FILL — PROCHLORPERAZINE 10 MG TAB: 10 | 7 days supply | Qty: 30 | Fill #0

## 2018-09-21 NOTE — Progress Notes (Signed)
Hospital Follow Up  Subjective:    Patient ID: Sabrina [name], female    DOB: Feb 25, 1966, 53 y.o.   MRN: [id number]  Chief Complaint  Patient presents with  . Hospitalization Follow-up  . LABS  . Headache    wants fiorcet   . Nausea   HPI  Sabrina [name] is a 53 year old female with a past medical history of Staph Infection, Situational Anxiety, PONV, Osteomyelitis of Elbow, Snake Bite, Cat Bite, Bullous Pemphigus, and Arthritis. She is here today for hospital follow up.   Current Status: Since her last ED visit for Chronic Pain of Right Knee on 09/09/2018.  She is scheduled for Right Knee Replacement by the end of this month. She continues to take Acetaminophen for pain.    she is doing well with no complaints. She has c/o frequent headaches, which she takes take Excedrin and Goody powders that are not effective.  She has increased anxiety and stress lately because of situational. She had discontinued taking Klonopin because she feels that it is not working. She denies suicidal ideations, homicidal ideations, or auditory hallucinations. She continues to follow up with Psychiatrist a Behavioral Health as needed. When she has headaches she gets nausea. She does not take anything for nausea. No reports of GI problems such as vomiting, diarrhea, and constipation. She has no reports of blood in stools, dysuria and hematuria.  She denies fevers, chills, fatigue, recent infections, weight loss, and night sweats. She has not had any headaches, visual changes, dizziness, and falls. No chest pain, heart palpitations, cough and shortness of breath reported. She denies pain today.   Review of Systems  Constitutional: Negative.   HENT: Negative.   Eyes: Negative.   Respiratory: Negative.   Cardiovascular: Negative.   Gastrointestinal: Negative.   Endocrine: Negative.   Genitourinary: Negative.   Musculoskeletal: Negative.   Allergic/Immunologic: Negative.   Neurological: Negative.   Hematological:  Negative.   Psychiatric/Behavioral: The patient is nervous/anxious.        R/t health status   Objective:   Physical Exam Vitals signs and nursing note reviewed.  Constitutional:      Appearance: She is well-developed. She is obese.  HENT:     Head: Normocephalic and atraumatic.     Mouth/Throat:     Mouth: Mucous membranes are moist.     Pharynx: Oropharynx is clear.  Eyes:     Extraocular Movements: Extraocular movements intact.  Neck:     Musculoskeletal: Normal range of motion and neck supple.  Cardiovascular:     Rate and Rhythm: Normal rate and regular rhythm.  Pulmonary:     Effort: Pulmonary effort is normal.     Breath sounds: Normal breath sounds.  Abdominal:     General: Bowel sounds are normal.     Palpations: Abdomen is soft.  Musculoskeletal: Normal range of motion.  Skin:    General: Skin is warm.     Capillary Refill: Capillary refill takes less than 2 seconds.  Neurological:     Mental Status: She is alert.  Psychiatric:        Mood and Affect: Mood normal.        Speech: Speech normal.        Behavior: Behavior normal.    Assessment & Plan:   1. Chronic intractable headache, unspecified headache type We will initiate Fioricet today.  - butalbital-acetaminophen-caffeine (FIORICET, ESGIC) 50-325-40 MG tablet; Take 1-2 tablets by mouth every 6 (six) hours as needed for headache.  Dispense: 20 tablet; Refill: 0  2. Chronic pain of right knee She is schedule for knee replacement with Dr. Clenton Pare in a few weeks.   3. Nausea We will initiate Compazine today.  - prochlorperazine (COMPAZINE) 10 MG tablet; Take 1 tablet (10 mg total) by mouth every 6 (six) hours as needed for nausea or vomiting.  Dispense: 30 tablet; Refill: 2  4. Generalized anxiety disorder with panic attacks Stable today. She reports discontinuing Klonopin, but continues Zoloft as prescribed. She will continue to follow up with Psychiatrist as needed.   5. Follow up She will follow  up in 6 months.   Meds ordered this encounter  Medications  . butalbital-acetaminophen-caffeine (FIORICET, ESGIC) 50-325-40 MG tablet    Sig: Take 1-2 tablets by mouth every 6 (six) hours as needed for headache.    Dispense:  20 tablet    Refill:  0  . prochlorperazine (COMPAZINE) 10 MG tablet    Sig: Take 1 tablet (10 mg total) by mouth every 6 (six) hours as needed for nausea or vomiting.    Dispense:  30 tablet    Refill:  2   [name],  MSN, FNP-C [facility] [address] [place], [place] [address] [phone]

## 2018-09-21 NOTE — Patient Instructions (Signed)
General Headache Without Cause A headache is pain or discomfort that is felt around the head or neck area. There are many causes and types of headaches. In some cases, the cause may not be found. Follow these instructions at home: Watch your condition for any changes. Let your doctor know about them. Take these steps to help with your condition: Managing pain      Take over-the-counter and prescription medicines only as told by your doctor.  Lie down in a dark, quiet room when you have a headache.  If told, put ice on your head and neck area: ? Put ice in a plastic bag. ? Place a towel between your skin and the bag. ? Leave the ice on for 20 minutes, 2-3 times per day.  If told, put heat on the affected area. Use the heat source that your doctor recommends, such as a moist heat pack or a heating pad. ? Place a towel between your skin and the heat source. ? Leave the heat on for 20-30 minutes. ? Remove the heat if your skin turns bright red. This is very important if you are unable to feel pain, heat, or cold. You may have a greater risk of getting burned.  Keep lights dim if bright lights bother you or make your headaches worse. Eating and drinking  Eat meals on a regular schedule.  If you drink alcohol: ? Limit how much you use to:  0-1 drink a day for women.  0-2 drinks a day for men. ? Be aware of how much alcohol is in your drink. In the [place] one drink equals one 12 oz bottle of beer (355 mL), one 5 oz glass of wine (148 mL), or one 1 oz glass of hard liquor (44 mL).  Stop drinking caffeine, or reduce how much caffeine you drink. General instructions   Keep a journal to find out if certain things bring on headaches. For example, write down: ? What you eat and drink. ? How much sleep you get. ? Any change to your diet or medicines.  Get a massage or try other ways to relax.  Limit stress.  Sit up straight. Do not tighten (tense) your muscles.  Do not use any  products that contain nicotine or tobacco. This includes cigarettes, e-cigarettes, and chewing tobacco. If you need help quitting, ask your doctor.  Exercise regularly as told by your doctor.  Get enough sleep. This often means 7-9 hours of sleep each night.  Keep all follow-up visits as told by your doctor. This is important. Contact a doctor if:  Your symptoms are not helped by medicine.  You have a headache that feels different than the other headaches.  You feel sick to your stomach (nauseous) or you throw up (vomit).  You have a fever. Get help right away if:  Your headache gets very bad quickly.  Your headache gets worse after a lot of physical activity.  You keep throwing up.  You have a stiff neck.  You have trouble seeing.  You have trouble speaking.  You have pain in the eye or ear.  Your muscles are weak or you lose muscle control.  You lose your balance or have trouble walking.  You feel like you will pass out (faint) or you pass out.  You are mixed up (confused).  You have a seizure. Summary  A headache is pain or discomfort that is felt around the head or neck area.  There are many causes and   types of headaches. In some cases, the cause may not be found.  Keep a journal to help find out what causes your headaches. Watch your condition for any changes. Let your doctor know about them.  Contact a doctor if you have a headache that is different from usual, or if your headache is not helped by medicine.  Get help right away if your headache gets very bad, you throw up, you have trouble seeing, you lose your balance, or you have a seizure. This information is not intended to replace advice given to you by your health care provider. Make sure you discuss any questions you have with your health care provider. Document Released: 06/07/2008 Document Revised: 03/19/2018 Document Reviewed: 03/19/2018 Elsevier Interactive Patient Education  2019 Elsevier  Inc. Acetaminophen; Butalbital; Caffeine tablets or capsules What is this medicine? ACETAMINOPHEN; BUTALBITAL; CAFFEINE (a set a MEE noe fen; byoo TAL bi tal; KAF een) is a pain reliever. It is used to treat tension headaches. This medicine may be used for other purposes; ask your health care provider or pharmacist if you have questions. COMMON BRAND NAME(S): Alagesic, Americet, Anolor-300, Arcet, BAC, CAPACET, Dolgic Plus, Esgic, Esgic Plus, Ezol, Fioricet, Ryder System, Medigesic, [place], [address] [place], Phrenilin Forte, Repan, Phillips, Triad, Zebutal What should I tell my health care provider before I take this medicine? They need to know if you have any of these conditions: -drug abuse or addiction -heart or circulation problems -if you often drink alcohol -kidney disease or problems going to the bathroom -liver disease -lung disease, asthma, or breathing problems -porphyria -an unusual or allergic reaction to acetaminophen, butalbital or other barbiturates, caffeine, other medicines, foods, dyes, or preservatives -pregnant or trying to get pregnant -breast-feeding How should I use this medicine? Take this medicine by mouth with a full glass of water. Follow the directions on the prescription label. If the medicine upsets your stomach, take the medicine with food or milk. Do not take more than you are told to take. Talk to your pediatrician regarding the use of this medicine in children. Special care may be needed. Overdosage: If you think you have taken too much of this medicine contact a poison control center or emergency room at once. NOTE: This medicine is only for you. Do not share this medicine with others. What if I miss a dose? If you miss a dose, take it as soon as you can. If it is almost time for your next dose, take only that dose. Do not take double or extra doses. What may interact with this medicine? -alcohol or medicines that contain alcohol -antidepressants, especially  MAOIs like isocarboxazid, phenelzine, tranylcypromine, and selegiline -antihistamines -benzodiazepines -carbamazepine -isoniazid -medicines for pain like pentazocine, buprenorphine, butorphanol, nalbuphine, tramadol, and propoxyphene -muscle relaxants -naltrexone -phenobarbital, phenytoin, and fosphenytoin -phenothiazines like perphenazine, thioridazine, chlorpromazine, mesoridazine, fluphenazine, prochlorperazine, promazine, and trifluoperazine -voriconazole This list may not describe all possible interactions. Give your health care provider a list of all the medicines, herbs, non-prescription drugs, or dietary supplements you use. Also tell them if you smoke, drink alcohol, or use illegal drugs. Some items may interact with your medicine. What should I watch for while using this medicine? Tell your doctor or health care professional if your pain does not go away, if it gets worse, or if you have new or a different type of pain. You may develop tolerance to the medicine. Tolerance means that you will need a higher dose of the medicine for pain relief. Tolerance is normal and is expected  if you take the medicine for a long time. Do not suddenly stop taking your medicine because you may develop a severe reaction. Your body becomes used to the medicine. This does NOT mean you are addicted. Addiction is a behavior related to getting and using a drug for a non-medical reason. If you have pain, you have a medical reason to take pain medicine. Your doctor will tell you how much medicine to take. If your doctor wants you to stop the medicine, the dose will be slowly lowered over time to avoid any side effects. You may get drowsy or dizzy when you first start taking the medicine or change doses. Do not drive, use machinery, or do anything that may be dangerous until you know how the medicine affects you. Stand or sit up slowly. Do not take other medicines that contain acetaminophen with this medicine. Always  read labels carefully. If you have questions, ask your doctor or pharmacist. If you take too much acetaminophen get medical help right away. Too much acetaminophen can be very dangerous and cause liver damage. Even if you do not have symptoms, it is important to get help right away. What side effects may I notice from receiving this medicine? Side effects that you should report to your doctor or health care professional as soon as possible: -allergic reactions like skin rash, itching or hives, swelling of the face, lips, or tongue -breathing problems -confusion -feeling faint or lightheaded, falls -redness, blistering, peeling or loosening of the skin, including inside the mouth -seizure -stomach pain -yellowing of the eyes or skin Side effects that usually do not require medical attention (report to your doctor or health care professional if they continue or are bothersome): -constipation -nausea, vomiting This list may not describe all possible side effects. Call your doctor for medical advice about side effects. You may report side effects to FDA at 1-800-FDA-1088. Where should I keep my medicine? Keep out of the reach of children. This medicine can be abused. Keep your medicine in a safe place to protect it from theft. Do not share this medicine with anyone. Selling or giving away this medicine is dangerous and against the law. This medicine may cause accidental overdose and death if it taken by other adults, children, or pets. Mix any unused medicine with a substance like cat litter or coffee grounds. Then throw the medicine away in a sealed container like a sealed bag or a coffee can with a lid. Do not use the medicine after the expiration date. Store at room temperature between 15 and 30 degrees C (59 and 86 degrees F). NOTE: This sheet is a summary. It may not cover all possible information. If you have questions about this medicine, talk to your doctor, pharmacist, or health care  provider.  2019 Elsevier/Gold Standard (2013-10-25 15:00:25)

## 2018-09-22 ENCOUNTER — Emergency Department (HOSPITAL_COMMUNITY)
Admission: EM | Admit: 2018-09-22 | Discharge: 2018-09-22 | Disposition: A | Discharge status: Home / Self Care | Payer: Self-pay | Attending: Emergency Medicine | Admitting: Emergency Medicine

## 2018-09-22 ENCOUNTER — Emergency Department (HOSPITAL_COMMUNITY): Payer: Self-pay

## 2018-09-22 ENCOUNTER — Other Ambulatory Visit: Payer: Self-pay

## 2018-09-22 ENCOUNTER — Encounter (HOSPITAL_COMMUNITY): Payer: Self-pay

## 2018-09-22 DIAGNOSIS — L03115 Cellulitis of right lower limb: Secondary | ICD-10-CM | POA: Insufficient documentation

## 2018-09-22 DIAGNOSIS — Y92009 Unspecified place in unspecified non-institutional (private) residence as the place of occurrence of the external cause: Secondary | ICD-10-CM | POA: Insufficient documentation

## 2018-09-22 DIAGNOSIS — W19XXXA Unspecified fall, initial encounter: Secondary | ICD-10-CM | POA: Insufficient documentation

## 2018-09-22 DIAGNOSIS — S80251A Superficial foreign body, right knee, initial encounter: Secondary | ICD-10-CM | POA: Insufficient documentation

## 2018-09-22 DIAGNOSIS — Z79899 Other long term (current) drug therapy: Secondary | ICD-10-CM | POA: Insufficient documentation

## 2018-09-22 DIAGNOSIS — Y939 Activity, unspecified: Secondary | ICD-10-CM | POA: Insufficient documentation

## 2018-09-22 DIAGNOSIS — Y999 Unspecified external cause status: Secondary | ICD-10-CM | POA: Insufficient documentation

## 2018-09-22 LAB — BASIC METABOLIC PANEL
Anion gap: 8 (ref 5–15)
BUN: 18 mg/dL (ref 6–20)
CO2: 25 mmol/L (ref 22–32)
Calcium: 9.2 mg/dL (ref 8.9–10.3)
Chloride: 107 mmol/L (ref 98–111)
Creatinine, Ser: 0.82 mg/dL (ref 0.44–1.00)
GFR calc Af Amer: >60 mL/min (ref >60)
GFR calc non Af Amer: >60 mL/min (ref >60)
Glucose, Bld: 97 mg/dL (ref 70–99)
Potassium: 3.4 mmol/L — ABNORMAL LOW (ref 3.5–5.1)
Sodium: 140 mmol/L (ref 135–145)

## 2018-09-22 LAB — CBC WITH DIFFERENTIAL/PLATELET
Abs Immature Granulocytes: 0 10*3/uL (ref 0.00–0.07)
Basophils Absolute: 0 10*3/uL (ref 0.0–0.1)
Basophils Relative: 0 %
Eosinophils Absolute: 0.1 10*3/uL (ref 0.0–0.5)
Eosinophils Relative: 2 %
HCT: 35.8 % — ABNORMAL LOW (ref 36.0–46.0)
Hemoglobin: 11.6 g/dL — ABNORMAL LOW (ref 12.0–15.0)
Immature Granulocytes: 0 %
Lymphocytes Relative: 44 %
Lymphs Abs: 1.6 10*3/uL (ref 0.7–4.0)
MCH: 32 pg (ref 26.0–34.0)
MCHC: 32.4 g/dL (ref 30.0–36.0)
MCV: 98.6 fL (ref 80.0–100.0)
Monocytes Absolute: 0.3 10*3/uL (ref 0.1–1.0)
Monocytes Relative: 9 %
Neutro Abs: 1.6 10*3/uL — ABNORMAL LOW (ref 1.7–7.7)
Neutrophils Relative %: 45 %
Platelets: 200 10*3/uL (ref 150–400)
RBC: 3.63 MIL/uL — ABNORMAL LOW (ref 3.87–5.11)
RDW: 13.2 % (ref 11.5–15.5)
WBC: 3.6 10*3/uL — ABNORMAL LOW (ref 4.0–10.5)
nRBC: 0 % (ref 0.0–0.2)

## 2018-09-22 LAB — SEDIMENTATION RATE: Sed Rate: 8 mm/hr (ref 0–22)

## 2018-09-22 MED ORDER — DOXYCYCLINE HYCLATE 100 MG PO CAPS: 100.0000 mg | ORAL_CAPSULE | Freq: Two times a day (BID) | ORAL | 0 refills | Status: AC

## 2018-09-22 MED ORDER — MELOXICAM 15 MG PO TABS: 15.0000 mg | ORAL_TABLET | Freq: Every day | ORAL | 0 refills | Status: DC

## 2018-09-22 MED ORDER — KETOROLAC TROMETHAMINE 30 MG/ML IJ SOLN
30.0000 mg | Freq: Once | INTRAMUSCULAR | Status: AC
  Administered 2018-09-22: 30 mg via INTRAVENOUS
  Filled 2018-09-22: qty 1

## 2018-09-22 MED ORDER — SODIUM CHLORIDE 0.9 % IV BOLUS
500.0000 mL | Freq: Once | INTRAVENOUS | Status: AC
  Administered 2018-09-22: 500 mL via INTRAVENOUS

## 2018-09-22 MED ORDER — CEPHALEXIN 500 MG PO CAPS: 500.0000 mg | ORAL_CAPSULE | Freq: Four times a day (QID) | ORAL | 0 refills | Status: AC

## 2018-09-22 MED ORDER — HYDROMORPHONE HCL 1 MG/ML IJ SOLN
0.5000 mg | Freq: Once | INTRAMUSCULAR | Status: AC
  Administered 2018-09-22: 0.5 mg via INTRAVENOUS
  Filled 2018-09-22: qty 1

## 2018-09-22 MED ORDER — CEPHALEXIN 500 MG PO CAPS: 500.0000 mg | ORAL_CAPSULE | Freq: Four times a day (QID) | ORAL | 0 refills | Status: DC

## 2018-09-22 MED ORDER — LIDOCAINE-EPINEPHRINE 2 %-1:100000 IJ SOLN
20.0000 mL | Freq: Once | INTRAMUSCULAR | Status: DC
  Filled 2018-09-22: qty 20

## 2018-09-22 MED ORDER — VANCOMYCIN HCL IN DEXTROSE 1-5 GM/200ML-% IV SOLN
1000.0000 mg | Freq: Once | INTRAVENOUS | Status: AC
  Administered 2018-09-22: 1000 mg via INTRAVENOUS
  Filled 2018-09-22: qty 200

## 2018-09-22 MED ORDER — PIPERACILLIN-TAZOBACTAM 3.375 G IVPB 30 MIN
3.3750 g | Freq: Once | INTRAVENOUS | Status: AC
  Administered 2018-09-22: 3.375 g via INTRAVENOUS
  Filled 2018-09-22: qty 50

## 2018-09-22 MED ORDER — METOCLOPRAMIDE HCL 5 MG/ML IJ SOLN
10.0000 mg | Freq: Once | INTRAMUSCULAR | Status: AC
  Administered 2018-09-22: 10 mg via INTRAVENOUS
  Filled 2018-09-22: qty 2

## 2018-09-22 MED ORDER — DIPHENHYDRAMINE HCL 50 MG/ML IJ SOLN
25.0000 mg | Freq: Once | INTRAMUSCULAR | Status: AC
  Administered 2018-09-22: 25 mg via INTRAVENOUS
  Filled 2018-09-22: qty 1

## 2018-09-22 MED ORDER — LIDOCAINE-EPINEPHRINE-TETRACAINE (LET) SOLUTION
3.0000 mL | Freq: Once | NASAL | Status: AC
  Administered 2018-09-22: 3 mL via TOPICAL
  Filled 2018-09-22: qty 3

## 2018-09-22 MED ORDER — LIDOCAINE-EPINEPHRINE (PF) 2 %-1:200000 IJ SOLN
10.0000 mL | Freq: Once | INTRAMUSCULAR | Status: AC
  Administered 2018-09-22: 10 mL via INTRADERMAL
  Filled 2018-09-22: qty 20

## 2018-09-22 MED ORDER — DOXYCYCLINE HYCLATE 100 MG PO CAPS: 100.0000 mg | ORAL_CAPSULE | Freq: Two times a day (BID) | ORAL | 0 refills | Status: DC

## 2018-09-22 NOTE — ED Notes (Signed)
Piece of metal is now visualized jutting approximately 1 inch out of pt's knee.

## 2018-09-22 NOTE — Discharge Instructions (Signed)
1. Medications:Please take all of your antibiotics until finished!   You may develop abdominal discomfort or diarrhea from the antibiotic.  You may help offset this with probiotics which you can buy or get in yogurt. Do not eat  or take the probiotics until 2 hours after your antibiotic.  You can take Mobic once daily with food as needed for pain. 2. Treatment: rest, ice, elevate and use crutches, drink plenty of fluids, gentle stretching 3. Follow Up: Follow-up with your orthopedist on Monday.  Return to the emergency department immediately if any concerning signs or symptoms develop such as worsening redness, abnormal drainage, fevers, or inability to move the joint.

## 2018-09-22 NOTE — ED Notes (Signed)
Patient ambulated to RR with crutch on weak side.

## 2018-09-22 NOTE — ED Triage Notes (Addendum)
Pt reports that she fell earlier this week and is concerned that there may be a nail or metal piece in her knee. She endorses yellow, foul-smelling discharge from site as well. She states that she went to the doctor for it, but they did not do x-rays. She also states that she is scheduled for a knee replacement later this month. A&Ox4. Ambulatory with crutches

## 2018-09-22 NOTE — ED Provider Notes (Signed)
[facility]-EMERGENCY DEPT Provider Note   CSN: [id number] Arrival date & time: 09/22/18  0232     History   Chief Complaint Chief Complaint  Patient presents with  . Knee Pain    HPI Sabrina [name] is a 53 y.o. female with history of arthritis, osteomyelitis of elbow, snakebite poisoning, cellulitis of the upper arm presenting for evaluation of acute onset, progressively worsening right knee pain for 6 days.  She reports that 6 days ago she sustained a fall after her patella dislocated which she reports is not uncommon for her.  She landed on the right knee.  Denies head injury or loss of consciousness.  Since then has had progressively worsening pain of the knee which is now constant and sharp and worsens with any attempts to bend or ambulate.  She states the day after she fell she felt a foreign body in her knee and attempted to "take it out with hemostats and tweezers ".  She is unsure if her tetanus is up-to-date.  Denies numbness or tingling.  No fevers.  Endorses nausea when the pain becomes severe but denies vomiting.  Went to see her orthopedist Dr. Cleophas DunkerWhitfield on Monday after she fell who gave her tramadol which she reports has not been helpful. She states "it doesn't work for me but it's what I asked for". Since then, she reports that the wound has been draining a lot of purulent fluid.  Her tetanus is up-to-date.  The history is provided by the patient.    Past Medical History:  Diagnosis Date  . Arthritis    "knees" (09/06/2017)  . Bullous pemphigus   . Cat bite 06/2014   to left elbow  . History of blood transfusion 1988   "when I had my baby"  . Muscle weakness of lower extremity 2001; 09/05/2017   "resolved after a couple weeks; ?" (09/06/2017)  . 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 Poisoning, snake bite 04/08/2016   "[place]; RUE"  . PONV (postoperative nausea and vomiting)   . Situational anxiety   . Staph infection ~ 2015   "left elbow and  finger"    Patient Active Problem List   Diagnosis Date Noted  . Weakness 09/06/2017  . Depression 09/06/2017  . 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  . Generalized anxiety disorder with panic attacks 09/06/2017  . Right hand pain   . Wrist swelling   . Cellulitis of right upper extremity 04/11/2016  . Elbow pain 07/20/2015  . Anxiety 07/20/2015  . Tachycardia 07/04/2015  . 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HCC)   . Skin ulcer of upper arm, limited to breakdown of skin (HCC) 07/01/2015    Past Surgical History:  Procedure Laterality Date  . APPENDECTOMY  ~ 1987  . APPLICATION OF A-CELL OF EXTREMITY Left 08/05/2015   Procedure: APPLICATION OF A-CELL OF EXTREMITY;  Surgeon: Peggye Formlaire S Dillingham, DO;  Location: [facility];  Service: Plastics;  Laterality: Left;  . BREAST SURGERY Right 1990   "milk duct taken out"  . DEBRIDEMENT AND CLOSURE WOUND Left 07/01/2015   Procedure: LEFT ELBOW EXCISION OF WOUND WITH PRIMARY CLOSURE 2X5 CM ;  Surgeon: Peggye Formlaire S Dillingham, DO;  Location: [facility];  Service: Plastics;  Laterality: Left;  . ELBOW SURGERY Left X 23 in [place] <06/2015   from a cat bite; all I&D  . I&D EXTREMITY Left 07/08/2015   Procedure: IRRIGATION AND DEBRIDEMENT EXTREMITY, DRAINAGE OF LEFT ARM WOUND, A-CELL PLACEMENT, WOUND VAC PLACEMENT;  Surgeon: Alena Bills Dillingham, DO;  Location: [facility];  Service: Plastics;  Laterality: Left;  . INCISION AND DRAINAGE OF WOUND Left 08/05/2015   Procedure: IRRIGATION AND DEBRIDEMENT LEFT ELBOW WOUND, PLACEMENT OF ACELL;  Surgeon: Peggye Form, DO;  Location: [facility];  Service: Plastics;  Laterality: Left;  . LAPAROSCOPIC CHOLECYSTECTOMY  1998  . SKIN GRAFT Left 2016   took from anterior thigh; placed at elbow  . TONSILLECTOMY  ~ 2000  . TOTAL ABDOMINAL HYSTERECTOMY  2003     OB History   No obstetric history on file.      Home Medications    Prior to Admission medications   Medication Sig  Start Date End Date Taking? Authorizing Provider  acetaminophen (TYLENOL) 325 MG tablet Take 650 mg by mouth every 6 (six) hours as needed for mild pain, moderate pain or headache.    Yes [provider]  gabapentin (NEURONTIN) 300 MG capsule TAKE 2 CAPSULES (600 MG TOTAL) BY MOUTH 3 TIMES DAILY. Patient taking differently: Take 600 mg by mouth 3 (three) times daily.  08/31/18  Yes [name], FNP  sertraline (ZOLOFT) 100 MG tablet 1  qam 09/14/18  Yes Plovsky, Earvin Hansen, MD  Vitamin D, Ergocalciferol, (DRISDOL) 1.25 MG (50000 UT) CAPS capsule Take 50,000 Units by mouth every 7 (seven) days. Each Thursday   Yes [provider]  butalbital-acetaminophen-caffeine Marikay Alar, ESGIC) [phone] MG tablet Take 1-2 tablets by mouth every 6 (six) hours as needed for headache. 09/21/18 09/21/19  [name], FNP  cephALEXin (KEFLEX) 500 MG capsule Take 1 capsule (500 mg total) by mouth 4 (four) times daily for 7 days. 09/22/18 09/29/18  Michela Pitcher A, PA-C  clonazePAM (KLONOPIN) 1 MG tablet 1 tablet (0.5mg ) by mouth qam and 2 po (1 mg) qhs Patient not taking: Reported on 09/21/2018 09/14/18   Archer Asa, MD  doxycycline (VIBRAMYCIN) 100 MG capsule Take 1 capsule (100 mg total) by mouth 2 (two) times daily for 7 days. 09/22/18 09/29/18  Michela Pitcher A, PA-C  meloxicam (MOBIC) 15 MG tablet Take 1 tablet (15 mg total) by mouth daily. 09/22/18   Aviva Wolfer A, PA-C  prochlorperazine (COMPAZINE) 10 MG tablet Take 1 tablet (10 mg total) by mouth every 6 (six) hours as needed for nausea or vomiting. 09/21/18   [name], FNP  traMADol (ULTRAM) 50 MG tablet Take 1 tablet (50 mg total) by mouth 2 (two) times daily. Patient not taking: Reported on 09/21/2018 09/17/18   Valeria Batman, MD    Family History Family History  Problem Relation Age of Onset  . Liver disease Mother   . Dementia Mother   . Cancer Mother   . Cancer Maternal Grandmother     Social History Social History    Tobacco Use  . Smoking status: Never Smoker  . Smokeless tobacco: Never Used  Substance Use Topics  . Alcohol use: No    Alcohol/week: 0.0 standard drinks    Comment: 09/06/2017 "I'll have a glass of wine a couple times/year; maybe"  . Drug use: No     Allergies   Zofran [ondansetron hcl]; Droperidol; Flexeril [cyclobenzaprine]; Morphine and related; Tessalon [benzonatate]; and Voltaren [diclofenac sodium]   Review of Systems Review of Systems  Constitutional: Negative for fever.  Gastrointestinal: Positive for nausea. Negative for vomiting.  Musculoskeletal: Positive for arthralgias.  Skin: Positive for wound.  Neurological: Negative for numbness.  All other systems reviewed and are negative.    Physical  Exam Updated Vital Signs BP (!) 100/59 (BP Location: Left Arm)   Pulse 72   Temp 98.5 F (36.9 C) (Oral)   Resp 18   SpO2 100%   Physical Exam Vitals signs and nursing note reviewed.  Constitutional:      General: She is not in acute distress.    Appearance: She is well-developed.  HENT:     Head: Normocephalic and atraumatic.  Eyes:     General:        Right eye: No discharge.        Left eye: No discharge.     Conjunctiva/sclera: Conjunctivae normal.  Neck:     Vascular: No JVD.     Trachea: No tracheal deviation.  Cardiovascular:     Rate and Rhythm: Normal rate.     Pulses: Normal pulses.     Comments: 2+DP/PT pulses bilaterally Pulmonary:     Effort: Pulmonary effort is normal.  Abdominal:     General: There is no distension.  Musculoskeletal:        General: Swelling and tenderness present.     Comments: See below image.  There is swelling and erythema to the inferior aspect of the right knee.  Examination limited due to significant pain with any movement.  Some warmth noted to the knee.  There appears to be the tip of a metal foreign body poking out of her skin.  Skin:    General: Skin is warm and dry.     Capillary Refill: Capillary refill  takes less than 2 seconds.     Findings: No erythema.  Neurological:     Mental Status: She is alert.     Comments: Fluent speech, no facial droop, sensation intact to soft touch of bilateral lower extremities  Psychiatric:        Behavior: Behavior normal.        ED Treatments / Results  Labs (all labs ordered are listed, but only abnormal results are displayed) Labs Reviewed  BASIC METABOLIC PANEL - Abnormal; Notable for the following components:      Result Value   Potassium 3.4 (*)    All other components within normal limits  CBC WITH DIFFERENTIAL/PLATELET - Abnormal; Notable for the following components:   WBC 3.6 (*)    RBC 3.63 (*)    Hemoglobin 11.6 (*)    HCT 35.8 (*)    Neutro Abs 1.6 (*)    All other components within normal limits  AEROBIC/ANAEROBIC CULTURE (SURGICAL/DEEP WOUND)  SEDIMENTATION RATE    EKG None  Radiology Dg Knee Complete 4 Views Right  Result Date: 09/22/2018 CLINICAL DATA:  Fall, possible foreign body EXAM: RIGHT KNEE - COMPLETE 4+ VIEW COMPARISON:  09/09/2018 FINDINGS: No fracture or malalignment. Mild to moderate patellofemoral degenerative change with superior and inferior bony spurring. Small knee effusion. Moderate degenerative change of the medial joint space. 16 mm linear metallic foreign body within the infrapatellar soft tissues on the lateral side of the knee. IMPRESSION: 1. No fracture or malalignment. Degenerative changes with small knee effusion 2. 16 mm linear metallic foreign body within the infrapatellar soft tissues on the lateral side Electronically Signed   By: Jasmine PangKim  Fujinaga M.D.   On: 09/22/2018 03:34    Procedures .Foreign Body Removal Date/Time: 09/22/2018 3:00 PM Performed by: Jeanie SewerFawze, Denelda Akerley A, PA-C Authorized by: Jeanie SewerFawze, Bertice Risse A, PA-C  Consent: Verbal consent obtained. Risks and benefits: risks, benefits and alternatives were discussed Consent given by: patient Patient understanding: patient states understanding  of the  procedure being performed Patient consent: the patient's understanding of the procedure matches consent given Procedure consent: procedure consent matches procedure scheduled Relevant documents: relevant documents present and verified Test results: test results available and properly labeled Site marked: the operative site was marked Imaging studies: imaging studies available Required items: required blood products, implants, devices, and special equipment available Patient identity confirmed: verbally with patient Time out: Immediately prior to procedure a "time out" was called to verify the correct patient, procedure, equipment, support staff and site/side marked as required. Body area: skin General location: lower extremity Location details: right knee Anesthesia: local infiltration  Anesthesia: Local Anesthetic: lidocaine 2% with epinephrine and LET (lido,epi,tetracaine) Anesthetic total: 3 mL  Sedation: Patient sedated: no  Patient restrained: no Patient cooperative: yes Localization method: visualized Removal mechanism: forceps Dressing: dressing applied Tendon involvement: superficial Depth: subcutaneous Complexity: simple 1 objects recovered. Objects recovered: metal rod Post-procedure assessment: foreign body removed Patient tolerance: Patient tolerated the procedure well with no immediate complications   (including critical care time)  Medications Ordered in ED Medications  HYDROmorphone (DILAUDID) injection 0.5 mg (0.5 mg Intravenous Given 09/22/18 0728)  metoCLOPramide (REGLAN) injection 10 mg (10 mg Intravenous Given 09/22/18 0727)  sodium chloride 0.9 % bolus 500 mL ( Intravenous Stopped 09/22/18 0800)  vancomycin (VANCOCIN) IVPB 1000 mg/200 mL premix ( Intravenous Stopped 09/22/18 1007)  ketorolac (TORADOL) 30 MG/ML injection 30 mg (30 mg Intravenous Given 09/22/18 0818)  piperacillin-tazobactam (ZOSYN) IVPB 3.375 g ( Intravenous Stopped 09/22/18 0847)    lidocaine-EPINEPHrine-tetracaine (LET) solution (3 mLs Topical Given 09/22/18 0818)  lidocaine-EPINEPHrine (XYLOCAINE W/EPI) 2 %-1:200000 (PF) injection 10 mL (10 mLs Intradermal Given by Other 09/22/18 1610)  diphenhydrAMINE (BENADRYL) injection 25 mg (25 mg Intravenous Given 09/22/18 1002)     Initial Impression / Assessment and Plan / ED Course  I have reviewed the triage vital signs and the nursing notes.  Pertinent labs & imaging results that were available during my care of the patient were reviewed by me and considered in my medical decision making (see chart for details).     Patient presenting for evaluation of foreign body in the knee sustained after mechanical fall 6 days ago.  She is afebrile, vital signs are at her baseline.  She is nontoxic in appearance.  She is neurovascularly intact.  Compartments are soft.  The examination is somewhat limited due to pain but she has significant difficulty ranging her right knee.  Radiographs show a 16 mm linear metallic foreign body within the infrapatellar soft tissues on the lateral side.  No fractures or malalignment.  Concern for deep infection given history of foreign body present for 6 days and reports that the patient was able to express a large amount of purulent drainage from the wound.  Tetanus is up-to-date.  7:45AM Myself and Dr. Nicanor Alcon have spoken with Dr. Ophelia Charter with [facility] orthopedics.  He does not feel that he needs to evaluate the patient in person but has reviewed images and recommends removal of foreign body at bedside.  He recommends follow-up in the office on Monday.  The wound was extensively irrigated and topical anesthetic was applied.  Localized numbing medicine was then injected around the wound.  Patient tolerated removal of the foreign body without difficulty.  She was then able to range her knee actively with less hesitation.  Unable to express any purulent material from the wound after removal of the metal foreign  body.  Patient ambulatory with the assistance of her  crutches.  Presently there does not appear to be any evidence of septic joint however we did discuss high likelihood of possible worsening infection.  Will discharge with Keflex and doxycycline for dual antibiotic coverage.  She understands to follow-up with her orthopedist in the office on Monday.  Discussed strict ED return precautions. Pt verbalized understanding of and agreement with plan and is safe for discharge home at this time.  Patient seen and evaluate by Dr. Nicanor Alcon who agrees with assessment and plan at this time.     Final Clinical Impressions(s) / ED Diagnoses   Final diagnoses:  Foreign body of skin of knee, right, initial encounter  Cellulitis of right lower extremity    ED Discharge Orders         Ordered    doxycycline (VIBRAMYCIN) 100 MG capsule  2 times daily,   Status:  Discontinued     09/22/18 0950    cephALEXin (KEFLEX) 500 MG capsule  4 times daily,   Status:  Discontinued     09/22/18 0950    meloxicam (MOBIC) 15 MG tablet  Daily,   Status:  Discontinued     09/22/18 0950    cephALEXin (KEFLEX) 500 MG capsule  4 times daily     09/22/18 1035    doxycycline (VIBRAMYCIN) 100 MG capsule  2 times daily     09/22/18 1035    meloxicam (MOBIC) 15 MG tablet  Daily     09/22/18 1035           Jeanie Sewer, PA-C 09/22/18 1511    Palumbo, April, MD 09/23/18 0320

## 2018-09-24 ENCOUNTER — Telehealth (HOSPITAL_COMMUNITY): Payer: Self-pay

## 2018-09-24 ENCOUNTER — Other Ambulatory Visit (HOSPITAL_COMMUNITY): Payer: Self-pay

## 2018-09-24 MED ORDER — DOXEPIN HCL 25 MG PO CAPS: 25.0000 mg | ORAL_CAPSULE | Freq: Every day | ORAL | 0 refills | Status: DC

## 2018-09-24 NOTE — Telephone Encounter (Signed)
Patient called saying that she hasn't been sleeping for two nights, she is having a lot of stress headaches. Klonopin is not helping her at all. She is not eating, and she is very anxious........   Called Dr. Donell Beers and he said to send in Doxepin 25mg  1 tab qhs.

## 2018-09-25 ENCOUNTER — Telehealth: Payer: Self-pay

## 2018-09-25 ENCOUNTER — Ambulatory Visit (INDEPENDENT_AMBULATORY_CARE_PROVIDER_SITE_OTHER): Payer: Self-pay | Admitting: Family Medicine

## 2018-09-25 ENCOUNTER — Other Ambulatory Visit (HOSPITAL_COMMUNITY): Payer: Self-pay

## 2018-09-25 DIAGNOSIS — Z23 Encounter for immunization: Secondary | ICD-10-CM

## 2018-09-25 MED ORDER — PAROXETINE HCL 20 MG PO TABS: 20.0000 mg | ORAL_TABLET | Freq: Every day | ORAL | 5 refills | Status: DC

## 2018-09-25 MED FILL — PARoxetine HCL 20 MG TABS: 20 | 30 days supply | Qty: 30 | Fill #0

## 2018-09-25 NOTE — Telephone Encounter (Signed)
Patient states that the Fioricet didn't help with her headaches Friday or Saturday and states that you wanted her to give you a call and you may want to add a new medication.

## 2018-09-26 ENCOUNTER — Other Ambulatory Visit: Payer: Self-pay | Admitting: Family Medicine

## 2018-09-26 DIAGNOSIS — G609 Hereditary and idiopathic neuropathy, unspecified: Secondary | ICD-10-CM

## 2018-09-27 ENCOUNTER — Other Ambulatory Visit: Payer: Self-pay | Admitting: Family Medicine

## 2018-09-27 ENCOUNTER — Telehealth: Payer: Self-pay

## 2018-09-27 DIAGNOSIS — G43719 Chronic migraine without aura, intractable, without status migrainosus: Secondary | ICD-10-CM

## 2018-09-27 DIAGNOSIS — G609 Hereditary and idiopathic neuropathy, unspecified: Secondary | ICD-10-CM

## 2018-09-27 DIAGNOSIS — R51 Headache: Principal | ICD-10-CM

## 2018-09-27 DIAGNOSIS — R519 Headache, unspecified: Secondary | ICD-10-CM

## 2018-09-27 MED ORDER — GABAPENTIN 300 MG PO CAPS: ORAL_CAPSULE | ORAL | 0 refills | Status: DC

## 2018-09-27 MED ORDER — PROPRANOLOL HCL 10 MG PO TABS: 10.0000 mg | ORAL_TABLET | Freq: Every day | ORAL | 1 refills | Status: DC

## 2018-09-27 MED FILL — PROPRANOLOL 10 MG TABLET: 10 | 30 days supply | Qty: 30 | Fill #0

## 2018-09-27 MED FILL — GABAPENTIN 300 MG CAPSULE: 300 | 20 days supply | Qty: 120 | Fill #0

## 2018-09-27 NOTE — Telephone Encounter (Signed)
Medication sent to pharmacy  

## 2018-09-27 NOTE — Telephone Encounter (Signed)
Patient notified and will follow up in a month

## 2018-10-08 ENCOUNTER — Telehealth (INDEPENDENT_AMBULATORY_CARE_PROVIDER_SITE_OTHER): Payer: Self-pay | Admitting: Orthopaedic Surgery

## 2018-10-08 NOTE — Telephone Encounter (Signed)
prPatient called stating she received her 100% coverage letter from [facility] on Saturday, 10/06/18.  Patient states she is ready to schedule surgery and requesting a return call.

## 2018-10-08 NOTE — Telephone Encounter (Signed)
Please advise. Surgery sheet coming to you this afternoon. Thank you.

## 2018-10-10 ENCOUNTER — Other Ambulatory Visit (HOSPITAL_COMMUNITY): Payer: Self-pay

## 2018-10-10 MED ORDER — CLONAZEPAM 1 MG PO TABS: ORAL_TABLET | ORAL | 0 refills | Status: DC

## 2018-10-12 ENCOUNTER — Other Ambulatory Visit: Payer: Self-pay | Admitting: Family Medicine

## 2018-10-12 ENCOUNTER — Other Ambulatory Visit (INDEPENDENT_AMBULATORY_CARE_PROVIDER_SITE_OTHER): Payer: Self-pay | Admitting: Orthopaedic Surgery

## 2018-10-12 ENCOUNTER — Telehealth: Payer: Self-pay

## 2018-10-12 DIAGNOSIS — G609 Hereditary and idiopathic neuropathy, unspecified: Secondary | ICD-10-CM

## 2018-10-12 MED FILL — clonazePAM 1 MG TABS: 1 | 30 days supply | Qty: 90 | Fill #0

## 2018-10-12 NOTE — Telephone Encounter (Signed)
Pharmacy states that there maybe some concern that patient is giving her Gabapentin to her animals and not really taking it. She wants to know if there is a way to test to see if patient is taking medication .

## 2018-10-14 ENCOUNTER — Other Ambulatory Visit: Payer: Self-pay | Admitting: Family Medicine

## 2018-10-15 ENCOUNTER — Telehealth: Payer: Self-pay

## 2018-10-15 ENCOUNTER — Telehealth (INDEPENDENT_AMBULATORY_CARE_PROVIDER_SITE_OTHER): Payer: Self-pay | Admitting: Orthopaedic Surgery

## 2018-10-15 MED FILL — GABAPENTIN 300 MG CAPSULE: 300 | 5 days supply | Qty: 30 | Fill #0

## 2018-10-15 NOTE — Telephone Encounter (Signed)
Patient is scheduled for her knee surgery on March 10th at [facility]. She is requesting Tramadol to get her through until surgery date.   Patient uses [facility]    cb  [phone]

## 2018-10-15 NOTE — Telephone Encounter (Signed)
Patient schedule with provider tomorrow at 1120

## 2018-10-15 NOTE — Telephone Encounter (Signed)
Duplicate

## 2018-10-15 NOTE — Telephone Encounter (Signed)
ok 

## 2018-10-15 NOTE — Telephone Encounter (Signed)
Please advise 

## 2018-10-16 ENCOUNTER — Ambulatory Visit (INDEPENDENT_AMBULATORY_CARE_PROVIDER_SITE_OTHER): Payer: Self-pay | Admitting: Family Medicine

## 2018-10-16 ENCOUNTER — Encounter: Payer: Self-pay | Admitting: Family Medicine

## 2018-10-16 ENCOUNTER — Other Ambulatory Visit: Payer: Self-pay | Admitting: Orthopaedic Surgery

## 2018-10-16 ENCOUNTER — Other Ambulatory Visit: Payer: Self-pay | Admitting: *Deleted

## 2018-10-16 VITALS — BP 104/66 | HR 100 | Temp 98.2°F | Ht 64.0 in | Wt 185.0 lb

## 2018-10-16 DIAGNOSIS — Z1212 Encounter for screening for malignant neoplasm of rectum: Secondary | ICD-10-CM

## 2018-10-16 DIAGNOSIS — Z1239 Encounter for other screening for malignant neoplasm of breast: Secondary | ICD-10-CM

## 2018-10-16 DIAGNOSIS — Z1211 Encounter for screening for malignant neoplasm of colon: Secondary | ICD-10-CM

## 2018-10-16 DIAGNOSIS — G629 Polyneuropathy, unspecified: Secondary | ICD-10-CM

## 2018-10-16 DIAGNOSIS — Z09 Encounter for follow-up examination after completed treatment for conditions other than malignant neoplasm: Secondary | ICD-10-CM

## 2018-10-16 MED ORDER — GABAPENTIN 300 MG PO CAPS: ORAL_CAPSULE | ORAL | 3 refills | Status: DC

## 2018-10-16 MED ORDER — TRAMADOL HCL 50 MG PO TABS: 50.0000 mg | ORAL_TABLET | Freq: Two times a day (BID) | ORAL | 0 refills | Status: DC

## 2018-10-16 MED FILL — traMADol HCL 50 MG TABS: 50 | 15 days supply | Qty: 30 | Fill #0

## 2018-10-16 NOTE — Progress Notes (Signed)
[facility]  Sick Visit--Hospital Follow Up  Subjective:  Patient ID: Sabrina [name], female    DOB: 03/26/1966  Age: 53 y.o. MRN: [id number]  CC:  Chief Complaint  Patient presents with  . Medication Management    HPI Sabrina [name] is a 52 year old female presents for Sick Visit.  Past Medical History:  Diagnosis Date  . Arthritis    "knees" (09/06/2017)  . Bullous pemphigus   . Cat bite 06/2014   to left elbow  . History of blood transfusion 1988   "when I had my baby"  . Muscle weakness of lower extremity 2001; 09/05/2017   "resolved after a couple weeks; ?" (09/06/2017)  . 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 Poisoning, snake bite 04/08/2016   "[place]; RUE"  . PONV (postoperative nausea and vomiting)   . Situational anxiety   . Staph infection ~ 2015   "left elbow and finger"   Current Status: Ms. Hayhurst was seen in the ED on 09/22/2018 for Foreign Body of Skin, where she was treated and released. Since her last office visit, she is doing well. Her anxiety is stable today. She denies suicidal ideations, homicidal ideations, or auditory hallucinations. She wants referrals for needed screenings today. She denies fevers, chills, fatigue, recent infections, weight loss, and night sweats. She has not had any headaches, visual changes, dizziness, and falls. No chest pain, heart palpitations, cough and shortness of breath reported. No reports of GI problems such as nausea, vomiting, diarrhea, and constipation. She has no reports of blood in stools, dysuria and hematuria.  Past Surgical History:  Procedure Laterality Date  . APPENDECTOMY  ~ 1987  . APPLICATION OF A-CELL OF EXTREMITY Left 08/05/2015   Procedure: APPLICATION OF A-CELL OF EXTREMITY;  Surgeon: Peggye Form, DO;  Location: [facility];  Service: Plastics;  Laterality: Left;  . BREAST SURGERY Right 1990   "milk duct taken out"  . DEBRIDEMENT AND  CLOSURE WOUND Left 07/01/2015   Procedure: LEFT ELBOW EXCISION OF WOUND WITH PRIMARY CLOSURE 2X5 CM ;  Surgeon: Peggye Form, DO;  Location: [facility];  Service: Plastics;  Laterality: Left;  . ELBOW SURGERY Left X 23 in [place] <06/2015   from a cat bite; all I&D  . I&D EXTREMITY Left 07/08/2015   Procedure: IRRIGATION AND DEBRIDEMENT EXTREMITY, DRAINAGE OF LEFT ARM WOUND, A-CELL PLACEMENT, WOUND VAC PLACEMENT;  Surgeon: Alena Bills Dillingham, DO;  Location: [facility];  Service: Plastics;  Laterality: Left;  . INCISION AND DRAINAGE OF WOUND Left 08/05/2015   Procedure: IRRIGATION AND DEBRIDEMENT LEFT ELBOW WOUND, PLACEMENT OF ACELL;  Surgeon: Peggye Form, DO;  Location: [facility];  Service: Plastics;  Laterality: Left;  . LAPAROSCOPIC CHOLECYSTECTOMY  1998  . SKIN GRAFT Left 2016   took from anterior thigh; placed at elbow  . TONSILLECTOMY  ~ 2000  . TOTAL ABDOMINAL HYSTERECTOMY  2003    Family History  Problem Relation Age of Onset  . Liver disease Mother   . Dementia Mother   . Cancer Mother   . Cancer Maternal Grandmother     Social History   Socioeconomic History  . Marital status: Divorced    Spouse name: Not on file  . Number of children: Not on file  . Years of education: Not on file  . Highest education level: Not on file  Occupational History  . Not on file  Social Needs  . Financial resource strain: Not on file  . Food insecurity:    Worry: Not on file    Inability: Not on file  . Transportation needs:    Medical: Not on file    Non-medical: Not on file  Tobacco Use  . Smoking status: Never Smoker  . Smokeless tobacco: Never Used  Substance and Sexual Activity  . Alcohol use: No    Alcohol/week: 0.0 standard drinks    Comment: 09/06/2017 "I'll have a glass of wine a couple times/year; maybe"  . Drug use: No  . Sexual activity: Not Currently  Lifestyle  . Physical activity:    Days per week: Not on file     Minutes per session: Not on file  . Stress: Not on file  Relationships  . Social connections:    Talks on phone: Not on file    Gets together: Not on file    Attends religious service: Not on file    Active member of club or organization: Not on file    Attends meetings of clubs or organizations: Not on file    Relationship status: Not on file  . Intimate partner violence:    Fear of current or ex partner: Not on file    Emotionally abused: Not on file    Physically abused: Not on file    Forced sexual activity: Not on file  Other Topics Concern  . Not on file  Social History Narrative  . Not on file    Outpatient Medications Prior to Visit  Medication Sig Dispense Refill  . acetaminophen (TYLENOL) 325 MG tablet Take 650 mg by mouth every 6 (six) hours as needed for mild pain, moderate pain or headache.     . clonazePAM (KLONOPIN) 1 MG tablet 1 tablet (0.5mg ) by mouth qam and 2 po (1 mg) qhs 90 tablet 0  . sertraline (ZOLOFT) 100 MG tablet Take 100 mg by mouth daily.    . Vitamin D, Ergocalciferol, (DRISDOL) 1.25 MG (50000 UT) CAPS capsule Take 50,000 Units by mouth every 7 (seven) days. Each Thursday    . gabapentin (NEURONTIN) 300 MG capsule TAKE 2 CAPSULES (600 MG TOTAL) BY MOUTH 3 TIMES DAILY. 120 capsule 0  . meloxicam (MOBIC) 15 MG tablet Take 1 tablet (15 mg total) by mouth daily. (Patient not taking: Reported on 10/16/2018) 10 tablet 0  . PARoxetine (PAXIL) 20 MG tablet Take 1 tablet (20 mg total) by mouth daily. (Patient not taking: Reported on 10/16/2018) 30 tablet 5  . prochlorperazine (COMPAZINE) 10 MG tablet Take 1 tablet (10 mg total) by mouth every 6 (six) hours as needed for nausea or vomiting. (Patient not taking: Reported on 10/16/2018) 30 tablet 2  . propranolol (INDERAL) 10 MG tablet Take 1 tablet (10 mg total) by mouth daily. (Patient not taking: Reported on 10/16/2018) 30 tablet 1  . traMADol (ULTRAM) 50 MG tablet Take 1 tablet (50 mg total) by mouth 2 (two) times daily.  (Patient not taking: Reported on 10/16/2018) 30 tablet 0   No facility-administered medications prior to visit.     Allergies  Allergen Reactions  . Zofran [Ondansetron Hcl] Hives and Other (See Comments)    Spots around iv site  . Droperidol Palpitations  . Flexeril [Cyclobenzaprine] Anxiety  . Morphine And Related Itching  . Tessalon [Benzonatate] Other (See Comments)    Watery eyes  . Voltaren [Diclofenac Sodium] Rash    ROS Review of Systems    Objective:  Physical Exam  BP 104/66 (BP Location: Left Arm, Patient Position: Sitting, Cuff Size: Large)   Pulse 100   Temp 98.2 F (36.8 C) (Oral)   Ht 5\' 4"  (1.626 m)   Wt 185 lb (83.9 kg)   SpO2 98%   BMI 31.76 kg/m  Wt Readings from Last 3 Encounters:  10/16/18 185 lb (83.9 kg)  09/21/18 180 lb (81.6 kg)  09/17/18 172 lb (78 kg)     Health Maintenance Due  Topic Date Due  . MAMMOGRAM  06/28/2018  . COLONOSCOPY  06/28/2018    There are no preventive care reminders to display for this patient.  Lab Results  Component Value Date   TSH 2.890 06/18/2018   Lab Results  Component Value Date   WBC 3.6 (L) 09/22/2018   HGB 11.6 (L) 09/22/2018   HCT 35.8 (L) 09/22/2018   MCV 98.6 09/22/2018   PLT 200 09/22/2018   Lab Results  Component Value Date   NA 140 09/22/2018   K 3.4 (L) 09/22/2018   CO2 25 09/22/2018   GLUCOSE 97 09/22/2018   BUN 18 09/22/2018   CREATININE 0.82 09/22/2018   BILITOT 0.3 06/18/2018   ALKPHOS 87 06/18/2018   AST 19 06/18/2018   ALT 14 06/18/2018   PROT 6.7 06/18/2018   ALBUMIN 4.4 06/18/2018   CALCIUM 9.2 09/22/2018   ANIONGAP 8 09/22/2018   Lab Results  Component Value Date   CHOL 209 (H) 06/18/2018   Lab Results  Component Value Date   HDL 73 06/18/2018   Lab Results  Component Value Date   LDLCALC 122 (H) 06/18/2018   Lab Results  Component Value Date   TRIG 71 06/18/2018   Lab Results  Component Value Date   CHOLHDL 2.9 06/18/2018   Lab Results    Component Value Date   HGBA1C 5.2 06/18/2018   Assessment & Plan:   1. Hospital follow up Skin problem resolved.   2. Neuropathy Gabapentin dosage reduced today to 300 mg BID. Patient is aware.  - Gabapentin level - gabapentin (NEURONTIN) 300 MG capsule; Take 2 capsules 2 times a day.  Dispense: 120 capsule; Refill: 3  2. Screening for colorectal cancer - Ambulatory referral to Gastroenterology  3. Screening for breast cancer - MM Digital Diagnostic Bilat; Future  4. Follow up She will keep previously scheduled follow up appointment.   Meds ordered this encounter  Medications  . gabapentin (NEURONTIN) 300 MG capsule    Sig: Take 2 capsules 2 times a day.    Dispense:  120 capsule    Refill:  3    Referral Orders     Ambulatory referral to Gastroenterology   Orders Placed This Encounter  Procedures  . MM Digital Diagnostic Bilat  . Gabapentin level  . Ambulatory referral to Gastroenterology    Raliegh IpNatalie Dejanira Pamintuan,  MSN, FNP-C [facility] [address]  [place], [place] [address] [phone]  Problem List Items Addressed This Visit    None    Visit Diagnoses    Neuropathy    -  Primary   Relevant Medications   gabapentin (NEURONTIN) 300 MG capsule   Other Relevant Orders   Gabapentin level   Screening for colorectal cancer       Relevant Orders   Ambulatory referral to Gastroenterology   MM Digital Diagnostic Bilat   Screening for breast cancer       Relevant Orders   MM Digital Diagnostic Bilat  Follow up          Meds ordered this encounter  Medications  . gabapentin (NEURONTIN) 300 MG capsule    Sig: Take 2 capsules 2 times a day.    Dispense:  120 capsule    Refill:  3    Follow-up: No follow-ups on file.    [name], FNP

## 2018-10-17 NOTE — Telephone Encounter (Signed)
Faxed

## 2018-10-18 ENCOUNTER — Ambulatory Visit: Payer: Self-pay | Admitting: Internal Medicine

## 2018-10-18 LAB — GABAPENTIN LEVEL: Gabapentin Lvl: 5.5 ug/mL (ref 4.0–16.0)

## 2018-10-18 NOTE — Telephone Encounter (Signed)
ok 

## 2018-10-18 NOTE — H&P (Addendum)
Sabrina [name] is an 53 y.o. female.    Chief Complaint: Painful right knee  HPI: Sabrina [name] is 53 years old and has a chronic history of right knee pain.  She notes that she had an injury in the 1990s and eventually had arthroscopy.  She is not exactly sure of the procedure but that she had at least some "loose cartilage".  Over the years she has had persistent pain to the point recently that she has had compromise of her ability to work and sleep.  She was having pain mostly along the medial compartment of her knee with some swelling.  She is tried several over-the-counter medicines including NSAIDs and Tylenol as well as a brace.  She is also had a Workmen's Comp. injury that was not related to her knee and has had tramadol and hydrocodone without much relief of her pain.   She is had pain both with and without weightbearing.  MRI scan of right knee demonstrates a complex tear of the medial meniscus associated with significant degenerative changes in the medial compartment.  There are degenerative changes of the patellofemoral and lateral compartment as well.  There was also an area of marrow edema in the proximal tibia that could be a stress reaction reaction.  The radiologist also listed as a differential diagnosis the possibility of some infiltrative process.  Options of right total knee replacement versus arthroscopy were given but due to her age an arthroscopic debridement has been scheduled.  Past Medical History:  Diagnosis Date  . Arthritis    "knees" (09/06/2017)  . Bullous pemphigus   . Cat bite 06/2014   to left elbow  . History of blood transfusion 1988   "when I had my baby"  . Muscle weakness of lower extremity 2001; 09/05/2017   "resolved after a couple weeks; ?" (09/06/2017)  . 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 Poisoning, snake bite 04/08/2016   "[place]; RUE"  . PONV (postoperative nausea and vomiting)   . Situational anxiety   . Staph infection ~ 2015   "left elbow  and finger"    Past Surgical History:  Procedure Laterality Date  . APPENDECTOMY  ~ 1987  . APPLICATION OF A-CELL OF EXTREMITY Left 08/05/2015   Procedure: APPLICATION OF A-CELL OF EXTREMITY;  Surgeon: Peggye Formlaire S Dillingham, DO;  Location: [facility];  Service: Plastics;  Laterality: Left;  . BREAST SURGERY Right 1990   "milk duct taken out"  . DEBRIDEMENT AND CLOSURE WOUND Left 07/01/2015   Procedure: LEFT ELBOW EXCISION OF WOUND WITH PRIMARY CLOSURE 2X5 CM ;  Surgeon: Peggye Formlaire S Dillingham, DO;  Location: [facility];  Service: Plastics;  Laterality: Left;  . ELBOW SURGERY Left X 23 in [place] <06/2015   from a cat bite; all I&D  . I&D EXTREMITY Left 07/08/2015   Procedure: IRRIGATION AND DEBRIDEMENT EXTREMITY, DRAINAGE OF LEFT ARM WOUND, A-CELL PLACEMENT, WOUND VAC PLACEMENT;  Surgeon: Alena Billslaire S Dillingham, DO;  Location: [facility];  Service: Plastics;  Laterality: Left;  . INCISION AND DRAINAGE OF WOUND Left 08/05/2015   Procedure: IRRIGATION AND DEBRIDEMENT LEFT ELBOW WOUND, PLACEMENT OF ACELL;  Surgeon: Peggye Formlaire S Dillingham, DO;  Location: [facility];  Service: Plastics;  Laterality: Left;  . LAPAROSCOPIC CHOLECYSTECTOMY  1998  . SKIN GRAFT Left 2016   took from anterior thigh; placed at elbow  . TONSILLECTOMY  ~ 2000  . TOTAL ABDOMINAL HYSTERECTOMY  2003    Family History  Problem  Relation Age of Onset  . Liver disease Mother   . Dementia Mother   . Cancer Mother   . Cancer Maternal Grandmother    Social History:  reports that she has never smoked. She has never used smokeless tobacco. She reports that she does not drink alcohol or use drugs.  Allergies:  Allergies  Allergen Reactions  . Zofran [Ondansetron Hcl] Hives and Other (See Comments)    Spots around iv site  . Droperidol Palpitations  . Flexeril [Cyclobenzaprine] Anxiety  . Morphine And Related Itching  . Tessalon [Benzonatate] Other (See Comments)    Watery eyes  .  Voltaren [Diclofenac Sodium] Rash    No medications prior to admission.    No results found for this or any previous visit (from the past 48 hour(s)). No results found.  Review of Systems  Constitutional: Negative for fatigue and fever.  HENT: Negative for ear pain.   Eyes: Negative for pain.  Respiratory: Negative for cough and shortness of breath.   Cardiovascular: Positive for leg swelling.  Gastrointestinal: Negative for constipation and diarrhea.  Genitourinary: Negative for difficulty urinating.  Musculoskeletal: Negative for back pain and neck pain.  Skin: Negative for rash.  Allergic/Immunologic: Negative for food allergies.  Neurological: Positive for weakness. Negative for numbness.  Hematological: Bruises/bleeds easily.  Psychiatric/Behavioral: Positive for sleep disturbance.     Physical Exam  Constitutional: She is oriented to person, place, and time. She appears well-developed and well-nourished.  HENT:  Head: Normocephalic and atraumatic.  Eyes: Pupils are equal, round, and reactive to light. Conjunctivae are normal.  Cardiovascular: Normal rate, regular rhythm, normal heart sounds and intact distal pulses.  Respiratory: Effort normal and breath sounds normal.  GI: Soft. Bowel sounds are normal.  Neurological: She is alert and oriented to person, place, and time.  Skin: Skin is warm and dry.  Psychiatric: She has a normal mood and affect. Her behavior is normal. Judgment and thought content normal.   Musculoskeletal: Pain predominantly on the medial compartment but also some pain laterally and some early ecchymosis laterally from her fall.  Able to fully extend her knee and flex over to instability.   Assessment/Plan Complex tear posterior horn of the medial meniscus with a radial component and peripheral meniscal extrusion.  Area of subchondral marrow edema in the proximal and mid anterior tibial epiphysis just anterior to the ACL.  Tricompartment  osteoarthritis.  Plan is for arthroscopic debridement of the right knee.  She knows that this may not be beneficial with the amount of arthritis that she has but she would like to stay with a more conservative plan.  Jacqualine Code, PA-C 10/18/2018, 12:43 PM

## 2018-10-19 ENCOUNTER — Other Ambulatory Visit: Payer: Self-pay | Admitting: Family Medicine

## 2018-10-19 ENCOUNTER — Telehealth: Payer: Self-pay

## 2018-10-19 DIAGNOSIS — R51 Headache: Principal | ICD-10-CM

## 2018-10-19 DIAGNOSIS — G8929 Other chronic pain: Secondary | ICD-10-CM

## 2018-10-19 DIAGNOSIS — G629 Polyneuropathy, unspecified: Secondary | ICD-10-CM

## 2018-10-19 MED ORDER — GABAPENTIN 300 MG PO CAPS: ORAL_CAPSULE | ORAL | 3 refills | Status: DC

## 2018-10-19 MED FILL — GABAPENTIN 300 MG CAPSULE: 300 | 30 days supply | Qty: 120 | Fill #0

## 2018-10-22 ENCOUNTER — Other Ambulatory Visit: Payer: Self-pay | Admitting: Family Medicine

## 2018-10-22 DIAGNOSIS — R51 Headache: Principal | ICD-10-CM

## 2018-10-22 DIAGNOSIS — G8929 Other chronic pain: Secondary | ICD-10-CM

## 2018-10-22 NOTE — Telephone Encounter (Signed)
Left a vm for patient to callback 

## 2018-10-23 ENCOUNTER — Other Ambulatory Visit: Payer: Self-pay | Admitting: Family Medicine

## 2018-10-23 DIAGNOSIS — G8929 Other chronic pain: Secondary | ICD-10-CM

## 2018-10-23 DIAGNOSIS — R51 Headache: Principal | ICD-10-CM

## 2018-10-23 NOTE — Telephone Encounter (Signed)
Patient notified and will pick up medication today 

## 2018-10-23 NOTE — Telephone Encounter (Signed)
Tried to contact patient no answer and left a vm  

## 2018-10-24 ENCOUNTER — Ambulatory Visit (HOSPITAL_COMMUNITY): Payer: Self-pay | Admitting: Psychiatry

## 2018-10-25 ENCOUNTER — Telehealth (INDEPENDENT_AMBULATORY_CARE_PROVIDER_SITE_OTHER): Payer: Self-pay | Admitting: Orthopaedic Surgery

## 2018-10-25 NOTE — Telephone Encounter (Signed)
Please advise 

## 2018-10-25 NOTE — Telephone Encounter (Signed)
Patient called stating she is not getting any pain relief from the Tramadol and is requesting a prescription of Hydrocodone to be sent to [facility].  Patient states she is "in a lot of pain and having difficulty sleeping."

## 2018-10-25 NOTE — Telephone Encounter (Signed)
We want you to take only tramadol before surgery because a stronger drug before surgery will not work as well after surgery...  We reviewed your Rx history on the [facility] website and you have had a lot of narcotics from several providers and Dr Cleophas Dunker is uncomfortable with giving you a stronger narcotic before surgery

## 2018-10-26 ENCOUNTER — Other Ambulatory Visit: Payer: Self-pay | Admitting: Family Medicine

## 2018-10-26 NOTE — Telephone Encounter (Signed)
I called patient 

## 2018-10-29 NOTE — Telephone Encounter (Signed)
Needs to wait another week

## 2018-10-31 ENCOUNTER — Ambulatory Visit: Payer: Self-pay | Admitting: Internal Medicine

## 2018-11-01 ENCOUNTER — Telehealth (INDEPENDENT_AMBULATORY_CARE_PROVIDER_SITE_OTHER): Payer: Self-pay | Admitting: Orthopaedic Surgery

## 2018-11-01 ENCOUNTER — Encounter (INDEPENDENT_AMBULATORY_CARE_PROVIDER_SITE_OTHER): Payer: Self-pay | Admitting: Orthopaedic Surgery

## 2018-11-01 NOTE — Telephone Encounter (Signed)
Please call patient when note is ready. Thank you.

## 2018-11-01 NOTE — Telephone Encounter (Signed)
Patient wants letter for date of release after surgery so she can continue her food stamps.

## 2018-11-01 NOTE — Telephone Encounter (Signed)
Not sure when we will release her.  Depends on how she does after surgery

## 2018-11-01 NOTE — Telephone Encounter (Signed)
Patient left a voicemail stating "she had a quick question about her surgery."  Patient requested a return call.

## 2018-11-01 NOTE — Telephone Encounter (Signed)
She is asking for a note for April, May and June.

## 2018-11-01 NOTE — Telephone Encounter (Signed)
Ok, just put anticipate possible release in June 3 months from surgery

## 2018-11-01 NOTE — Telephone Encounter (Signed)
Spoke with patient. Left note up front for pick up. °

## 2018-11-08 ENCOUNTER — Other Ambulatory Visit (HOSPITAL_COMMUNITY): Payer: Self-pay | Admitting: Psychiatry

## 2018-11-08 ENCOUNTER — Other Ambulatory Visit (HOSPITAL_COMMUNITY): Payer: Self-pay

## 2018-11-08 MED ORDER — CLONAZEPAM 0.5 MG PO TABS: ORAL_TABLET | ORAL | 0 refills | Status: DC

## 2018-11-08 MED FILL — PARoxetine HCL 20 MG TABS: 20 | 30 days supply | Qty: 30 | Fill #1

## 2018-11-09 MED FILL — clonazePAM 0.5 MG TABS: 0.5 | 30 days supply | Qty: 90 | Fill #0

## 2018-11-14 ENCOUNTER — Encounter: Payer: Self-pay | Admitting: Gastroenterology

## 2018-11-15 MED FILL — GABAPENTIN 300 MG CAPSULE: 300 | 30 days supply | Qty: 120 | Fill #1

## 2018-11-20 ENCOUNTER — Telehealth (INDEPENDENT_AMBULATORY_CARE_PROVIDER_SITE_OTHER): Payer: Self-pay | Admitting: Orthopaedic Surgery

## 2018-11-20 NOTE — Telephone Encounter (Signed)
Please advise 

## 2018-11-20 NOTE — Telephone Encounter (Signed)
She will make appt to be seen on Thursday.no need to call her

## 2018-11-20 NOTE — Telephone Encounter (Signed)
Patient having increased pain, problems with lt knee now. Patient states tendon in knee will seem to get stuck when she bends it under her on the couch. Patient wondering if doctor needs to see lt knee before surgery on right? Please call to inform.

## 2018-11-22 ENCOUNTER — Ambulatory Visit (INDEPENDENT_AMBULATORY_CARE_PROVIDER_SITE_OTHER): Payer: Self-pay | Admitting: Orthopaedic Surgery

## 2018-11-22 ENCOUNTER — Ambulatory Visit: Payer: Self-pay | Admitting: Internal Medicine

## 2018-11-27 ENCOUNTER — Inpatient Hospital Stay (INDEPENDENT_AMBULATORY_CARE_PROVIDER_SITE_OTHER): Payer: Self-pay | Admitting: Orthopaedic Surgery

## 2018-11-27 NOTE — Patient Instructions (Addendum)
Sabrina [name]  11/27/2018   Your procedure is scheduled on: 12-04-18    Report to [facility]  Entrance    Report to Admitting at 7:30 AM    Call this number if you have problems the morning of surgery [phone]    Remember: Do not eat food or drink liquids :After Midnight.    BRUSH YOUR TEETH MORNING OF SURGERY AND RINSE YOUR MOUTH OUT, NO CHEWING GUM CANDY OR MINTS.     Take these medicines the morning of surgery with A SIP OF WATER: Clonazepam (Klonopin), Gabapentin (Neurontin), and Paroxetine (Paxil)                             You may not have any metal on your body including hair pins and              piercings  Do not wear jewelry, make-up, lotions, powders or perfumes, deodorant             Do not wear nail polish.  Do not shave  48 hours prior to surgery.                 Do not bring valuables to the hospital. [facility] IS NOT             RESPONSIBLE   FOR VALUABLES.  Contacts, dentures or bridgework may not be worn into surgery.      Patients discharged the day of surgery will not be allowed to drive home. IF YOU ARE HAVING SURGERY AND GOING HOME THE SAME DAY, YOU MUST HAVE AN ADULT TO DRIVE YOU HOME AND BE WITH YOU FOR 24 HOURS. YOU MAY GO HOME BY TAXI OR UBER OR ORTHERWISE, BUT AN ADULT MUST ACCOMPANY YOU HOME AND STAY WITH YOU FOR 24 HOURS.    Name and phone number of your driver: Olen Pel [phone]                Please read over the following fact sheets you were given: _____________________________________________________________________             [facility] - Preparing for Surgery Before surgery, you can play an important role.  Because skin is not sterile, your skin needs to be as free of germs as possible.  You can reduce the number of germs on your skin by washing with CHG (chlorahexidine gluconate) soap before surgery.  CHG is an antiseptic cleaner which kills germs and bonds with the skin to continue killing  germs even after washing. Please DO NOT use if you have an allergy to CHG or antibacterial soaps.  If your skin becomes reddened/irritated stop using the CHG and inform your nurse when you arrive at Short Stay. Do not shave (including legs and underarms) for at least 48 hours prior to the first CHG shower.  You may shave your face/neck. Please follow these instructions carefully:  1.  Shower with CHG Soap the night before surgery and the  morning of Surgery.  2.  If you choose to wash your hair, wash your hair first as usual with your  normal  shampoo.  3.  After you shampoo, rinse your hair and body thoroughly to remove the  shampoo.  4.  Use CHG as you would any other liquid soap.  You can apply chg directly  to the skin and wash                       Gently with a scrungie or clean washcloth.  5.  Apply the CHG Soap to your body ONLY FROM THE NECK DOWN.   Do not use on face/ open                           Wound or open sores. Avoid contact with eyes, ears mouth and genitals (private parts).                       Wash face,  Genitals (private parts) with your normal soap.             6.  Wash thoroughly, paying special attention to the area where your surgery  will be performed.  7.  Thoroughly rinse your body with warm water from the neck down.  8.  DO NOT shower/wash with your normal soap after using and rinsing off  the CHG Soap.                9.  Pat yourself dry with a clean towel.            10.  Wear clean pajamas.            11.  Place clean sheets on your bed the night of your first shower and do not  sleep with pets. Day of Surgery : Do not apply any lotions/deodorants the morning of surgery.  Please wear clean clothes to the hospital/surgery center.  FAILURE TO FOLLOW THESE INSTRUCTIONS MAY RESULT IN THE CANCELLATION OF YOUR SURGERY PATIENT SIGNATURE_________________________________  NURSE  SIGNATURE__________________________________  ________________________________________________________________________

## 2018-11-27 NOTE — Progress Notes (Signed)
11-27-18 Pt contacted as a part of the COVID-19 screening. Pt denies cough, fever greater than 100.4, runny nose, sore throat,difficulty breathing/shortness of breath, as well as 'self' travel or travel of family member in the last 14 days.

## 2018-11-28 ENCOUNTER — Encounter (HOSPITAL_COMMUNITY): Payer: Self-pay

## 2018-11-28 ENCOUNTER — Encounter (HOSPITAL_COMMUNITY)
Admission: RE | Admit: 2018-11-28 | Discharge: 2018-11-28 | Disposition: A | Discharge status: Home / Self Care | Payer: Self-pay | Source: Ambulatory Visit | Attending: Orthopaedic Surgery | Admitting: Orthopaedic Surgery

## 2018-11-28 ENCOUNTER — Other Ambulatory Visit: Payer: Self-pay

## 2018-11-28 DIAGNOSIS — S83241A Other tear of medial meniscus, current injury, right knee, initial encounter: Secondary | ICD-10-CM | POA: Insufficient documentation

## 2018-11-28 DIAGNOSIS — Z01818 Encounter for other preprocedural examination: Secondary | ICD-10-CM | POA: Insufficient documentation

## 2018-11-28 DIAGNOSIS — I10 Essential (primary) hypertension: Secondary | ICD-10-CM | POA: Insufficient documentation

## 2018-11-28 LAB — CBC
HEMATOCRIT: 37.8 % (ref 36.0–46.0)
HEMOGLOBIN: 11.8 g/dL — AB (ref 12.0–15.0)
MCH: 31.3 pg (ref 26.0–34.0)
MCHC: 31.2 g/dL (ref 30.0–36.0)
MCV: 100.3 fL — ABNORMAL HIGH (ref 80.0–100.0)
Platelets: 240 10*3/uL (ref 150–400)
RBC: 3.77 MIL/uL — ABNORMAL LOW (ref 3.87–5.11)
RDW: 13.2 % (ref 11.5–15.5)
WBC: 3.7 10*3/uL — ABNORMAL LOW (ref 4.0–10.5)
nRBC: 0 % (ref 0.0–0.2)

## 2018-11-28 LAB — BASIC METABOLIC PANEL
Anion gap: 7 (ref 5–15)
BUN: 23 mg/dL — ABNORMAL HIGH (ref 6–20)
CO2: 25 mmol/L (ref 22–32)
Calcium: 9.2 mg/dL (ref 8.9–10.3)
Chloride: 108 mmol/L (ref 98–111)
Creatinine, Ser: 0.71 mg/dL (ref 0.44–1.00)
GFR calc Af Amer: >60 mL/min (ref >60)
Glucose, Bld: 105 mg/dL — ABNORMAL HIGH (ref 70–99)
Potassium: 4.2 mmol/L (ref 3.5–5.1)
Sodium: 140 mmol/L (ref 135–145)

## 2018-11-28 NOTE — Progress Notes (Addendum)
Pt reports that she was taking Propranolol for headaches, not for cardiac reasons. In addition, she is not currently taking them. As a result, no EKG was done at PAT appt.   11-28-18 BMP result routed to Dr. Hoy Register office for review

## 2018-12-04 ENCOUNTER — Ambulatory Visit (HOSPITAL_COMMUNITY): Admission: RE | Admit: 2018-12-04 | Payer: Self-pay | Source: Home / Self Care | Admitting: Orthopaedic Surgery

## 2018-12-04 ENCOUNTER — Encounter (HOSPITAL_COMMUNITY): Admission: RE | Payer: Self-pay | Source: Home / Self Care

## 2018-12-04 SURGERY — ARTHROSCOPY, KNEE, WITH MEDIAL MENISCECTOMY
Anesthesia: Choice | Laterality: Right

## 2018-12-05 ENCOUNTER — Other Ambulatory Visit (HOSPITAL_COMMUNITY): Payer: Self-pay

## 2018-12-05 ENCOUNTER — Other Ambulatory Visit (HOSPITAL_COMMUNITY): Payer: Self-pay | Admitting: Psychiatry

## 2018-12-05 MED ORDER — PAROXETINE HCL 20 MG PO TABS: 20.0000 mg | ORAL_TABLET | Freq: Every day | ORAL | 0 refills | Status: DC

## 2018-12-05 MED ORDER — CLONAZEPAM 0.5 MG PO TABS: ORAL_TABLET | ORAL | 0 refills | Status: DC

## 2018-12-05 MED FILL — PARoxetine HCL 20 MG TABS: 20 | 30 days supply | Qty: 30 | Fill #2

## 2018-12-06 ENCOUNTER — Telehealth: Payer: Self-pay

## 2018-12-06 MED FILL — GABAPENTIN 300 MG CAPSULE: 300 | 30 days supply | Qty: 120 | Fill #2

## 2018-12-06 MED FILL — clonazePAM 0.5 MG TABS: 0.5 | 30 days supply | Qty: 90 | Fill #0

## 2018-12-06 NOTE — Telephone Encounter (Signed)
Patient had a death in the family and has been taking the Gabapentin more than she is suppose to. She is not due for refill until next Wednesday and wants to know if you will send in a early fill so she want go through withdrawals. Patient is aware that you are out office.

## 2018-12-07 NOTE — Telephone Encounter (Signed)
Patient just picked up a script yesterday

## 2018-12-10 ENCOUNTER — Other Ambulatory Visit: Payer: Self-pay | Admitting: Family Medicine

## 2018-12-10 ENCOUNTER — Telehealth (INDEPENDENT_AMBULATORY_CARE_PROVIDER_SITE_OTHER): Payer: Self-pay | Admitting: Orthopaedic Surgery

## 2018-12-10 ENCOUNTER — Telehealth: Payer: Self-pay

## 2018-12-10 MED FILL — VIT D2 1.25 MG (50,000 UNIT: 1.25 MG | 55 days supply | Qty: 8 | Fill #1

## 2018-12-10 NOTE — Telephone Encounter (Signed)
Please advise 

## 2018-12-10 NOTE — Telephone Encounter (Signed)
Left a vm for patient to callback 

## 2018-12-10 NOTE — Telephone Encounter (Signed)
Pt is seeking meds from her primary care office as well per the chart-she needs to procure meds from her primary care office only

## 2018-12-10 NOTE — Telephone Encounter (Signed)
Patient called stating since her surgery was cancelled, patient is requesting pain medicine to be sent to [facility].  Patient states there is no reschedule date at this time.  Patient states she has "extreme pain" and has been taking Ibuprofen but it is not helping.  Patient states "the pain is shooting from her hip to her foot making it difficult to sleep."  Patient requested a return call to let her know if the medication can be called in.

## 2018-12-10 NOTE — Telephone Encounter (Signed)
Spoke with patient and advised

## 2018-12-10 NOTE — Telephone Encounter (Signed)
Patient got medication.

## 2018-12-10 NOTE — Telephone Encounter (Signed)
Please call patient and advise. Thank you.

## 2018-12-10 NOTE — Telephone Encounter (Signed)
Patient would like to try Fioricet again because her headaches have gotten really bad.

## 2018-12-11 ENCOUNTER — Inpatient Hospital Stay (INDEPENDENT_AMBULATORY_CARE_PROVIDER_SITE_OTHER): Payer: Self-pay | Admitting: Orthopaedic Surgery

## 2018-12-11 ENCOUNTER — Other Ambulatory Visit: Payer: Self-pay | Admitting: Family Medicine

## 2018-12-11 ENCOUNTER — Encounter: Payer: Self-pay | Admitting: *Deleted

## 2018-12-11 DIAGNOSIS — R51 Headache: Principal | ICD-10-CM

## 2018-12-11 DIAGNOSIS — R519 Headache, unspecified: Secondary | ICD-10-CM

## 2018-12-11 MED ORDER — TOPIRAMATE 25 MG PO TABS: 25.0000 mg | ORAL_TABLET | Freq: Two times a day (BID) | ORAL | 1 refills | Status: DC

## 2018-12-11 MED FILL — TOPIRAMATE 25 MG TABLET: 25 | 30 days supply | Qty: 60 | Fill #0

## 2018-12-12 ENCOUNTER — Other Ambulatory Visit: Payer: Self-pay

## 2018-12-12 ENCOUNTER — Encounter

## 2018-12-12 ENCOUNTER — Ambulatory Visit (INDEPENDENT_AMBULATORY_CARE_PROVIDER_SITE_OTHER): Payer: Self-pay | Admitting: Psychiatry

## 2018-12-12 DIAGNOSIS — F4001 Agoraphobia with panic disorder: Secondary | ICD-10-CM

## 2018-12-12 MED ORDER — VENLAFAXINE HCL ER 37.5 MG PO CP24: ORAL_CAPSULE | ORAL | 4 refills | Status: DC

## 2018-12-12 MED ORDER — CLONAZEPAM 0.5 MG PO TABS: ORAL_TABLET | ORAL | 3 refills | Status: DC

## 2018-12-12 MED ORDER — PAROXETINE HCL 10 MG PO TABS: ORAL_TABLET | ORAL | 0 refills | Status: DC

## 2018-12-12 MED FILL — PARoxetine HCL 10 MG TABS: 10 | 14 days supply | Qty: 14 | Fill #0

## 2018-12-12 MED FILL — VENLAFAXINE HCL ER 37.5 MG: 37.5 | 30 days supply | Qty: 30 | Fill #0

## 2018-12-12 NOTE — Progress Notes (Signed)
BH MD/PA/NP OP Progress Note  12/12/2018 4:36 PM Sabrina [name]  MRN:  [id number]  Chief Complaint: Panic disorder  Today the patient was interviewed on the phone.  She is only doing fairly well.  She was changed from Zoloft to Paxil because the Zoloft was making her excessively anxious.  The Paxil was not working very well.  It does reduce the panic attacks but it causes nightmares.  She also fears it is causing her to gain weight which is probably true.  She is sleeping just a little bit better but again has nightmares and it is very uncomfortable in the morning.  Her panic frequency is relatively controlled.  Patient is changing her primary care doctor.  Patient is cautious about going outside if she has any neurological disorder.  Patient denies daily depression.  She has no evidence of psychosis.  She denies any use of drugs or alcohol.  She takes her Klonopin just as prescribed which is essentially 1 3 times daily.  The patient has few psychosocial stressors at this time.  Her panic disorder is relatively controlled but she is having problems with the Paxil. HPI:   Past Psychiatric History: See intake H&P for full details. Reviewed, with no updates at this time.   Past Medical History:  Past Medical History:  Diagnosis Date  . Arthritis    "knees" (09/06/2017)  . Bullous pemphigus   . Cat bite 06/2014   to left elbow  . History of blood transfusion 1988   "when I had my baby"  . Muscle weakness of lower extremity 2001; 09/05/2017   "resolved after a couple weeks; ?" (09/06/2017)  . 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 Poisoning, snake bite 04/08/2016   "[place]; RUE"  . PONV (postoperative nausea and vomiting)   . Situational anxiety   . Staph infection ~ 2015   "left elbow and finger"    Past Surgical History:  Procedure Laterality Date  . APPENDECTOMY  ~ 1987  . APPLICATION OF A-CELL OF EXTREMITY Left 08/05/2015   Procedure: APPLICATION OF A-CELL OF EXTREMITY;  Surgeon:  Peggye Form, DO;  Location: [facility];  Service: Plastics;  Laterality: Left;  . BREAST SURGERY Right 1990   "milk duct taken out"  . DEBRIDEMENT AND CLOSURE WOUND Left 07/01/2015   Procedure: LEFT ELBOW EXCISION OF WOUND WITH PRIMARY CLOSURE 2X5 CM ;  Surgeon: Peggye Form, DO;  Location: [facility];  Service: Plastics;  Laterality: Left;  . ELBOW SURGERY Left X 23 in [place] <06/2015   from a cat bite; all I&D  . I&D EXTREMITY Left 07/08/2015   Procedure: IRRIGATION AND DEBRIDEMENT EXTREMITY, DRAINAGE OF LEFT ARM WOUND, A-CELL PLACEMENT, WOUND VAC PLACEMENT;  Surgeon: Alena Bills Dillingham, DO;  Location: [facility];  Service: Plastics;  Laterality: Left;  . INCISION AND DRAINAGE OF WOUND Left 08/05/2015   Procedure: IRRIGATION AND DEBRIDEMENT LEFT ELBOW WOUND, PLACEMENT OF ACELL;  Surgeon: Peggye Form, DO;  Location: [facility];  Service: Plastics;  Laterality: Left;  . LAPAROSCOPIC CHOLECYSTECTOMY  1998  . SKIN GRAFT Left 2016   took from anterior thigh; placed at elbow  . TONSILLECTOMY  ~ 2000  . TOTAL ABDOMINAL HYSTERECTOMY  2003  . WRIST SURGERY Right 01/2016    Family Psychiatric History: See intake H&P for full details. Reviewed, with no updates at this time.   Family History:  Family History  Problem Relation Age of Onset  . Liver  disease Mother   . Dementia Mother   . Cirrhosis Mother   . Prostate cancer Father   . Colon cancer Maternal Grandmother   . Ovarian cancer Maternal Aunt     Social History:  Social History   Socioeconomic History  . Marital status: Divorced    Spouse name: Not on file  . Number of children: Not on file  . Years of education: Not on file  . Highest education level: Not on file  Occupational History  . Not on file  Social Needs  . Financial resource strain: Not on file  . Food insecurity:    Worry: Not on file    Inability: Not on file  . Transportation needs:     Medical: Not on file    Non-medical: Not on file  Tobacco Use  . Smoking status: Never Smoker  . Smokeless tobacco: Never Used  Substance and Sexual Activity  . Alcohol use: No    Alcohol/week: 0.0 standard drinks    Comment: 09/06/2017 "I'll have a glass of wine a couple times/year; maybe"  . Drug use: No  . Sexual activity: Not Currently  Lifestyle  . Physical activity:    Days per week: Not on file    Minutes per session: Not on file  . Stress: Not on file  Relationships  . Social connections:    Talks on phone: Not on file    Gets together: Not on file    Attends religious service: Not on file    Active member of club or organization: Not on file    Attends meetings of clubs or organizations: Not on file    Relationship status: Not on file  Other Topics Concern  . Not on file  Social History Narrative  . Not on file    Allergies:  Allergies  Allergen Reactions  . Zofran [Ondansetron Hcl] Hives and Other (See Comments)    Spots around iv site  . Droperidol Palpitations  . Flexeril [Cyclobenzaprine] Anxiety  . Morphine And Related Itching  . Tessalon [Benzonatate] Other (See Comments)    Watery eyes  . Voltaren [Diclofenac Sodium] Rash    Metabolic Disorder Labs: Lab Results  Component Value Date   HGBA1C 5.2 06/18/2018   MPG  07/05/2015    QUESTIONABLE IDENTIFICATION / INCORRECTLY LABELED SPECIMEN   No results found for: PROLACTIN Lab Results  Component Value Date   CHOL 209 (H) 06/18/2018   TRIG 71 06/18/2018   HDL 73 06/18/2018   CHOLHDL 2.9 06/18/2018   VLDL 29 04/27/2015   LDLCALC 122 (H) 06/18/2018   LDLCALC 138 (H) 04/27/2015   Lab Results  Component Value Date   TSH 2.890 06/18/2018   TSH  07/05/2015    QUESTIONABLE IDENTIFICATION / INCORRECTLY LABELED SPECIMEN    Therapeutic Level Labs: No results found for: LITHIUM No results found for: VALPROATE No components found for:  CBMZ  Current Medications: Current Outpatient Medications   Medication Sig Dispense Refill  . clonazePAM (KLONOPIN) 0.5 MG tablet 1  tid 90 tablet 3  . gabapentin (NEURONTIN) 300 MG capsule Take 2 capsules 2 times a day. (Patient taking differently: Take 600 mg by mouth 2 (two) times daily. ) 120 capsule 3  . meloxicam (MOBIC) 15 MG tablet Take 1 tablet (15 mg total) by mouth daily. (Patient not taking: Reported on 10/16/2018) 10 tablet 0  . PARoxetine (PAXIL) 10 MG tablet 1  qhs  For 2 weeks  Then D/C 14 tablet 0  .  prochlorperazine (COMPAZINE) 10 MG tablet Take 1 tablet (10 mg total) by mouth every 6 (six) hours as needed for nausea or vomiting. 30 tablet 2  . topiramate (TOPAMAX) 25 MG tablet Take 1 tablet (25 mg total) by mouth 2 (two) times daily. 60 tablet 1  . traMADol (ULTRAM) 50 MG tablet Take 1 tablet (50 mg total) by mouth 2 (two) times daily. 30 tablet 0  . venlafaxine XR (EFFEXOR XR) 37.5 MG 24 hr capsule 1  qam 30 capsule 4  . Vitamin D, Ergocalciferol, (DRISDOL) 1.25 MG (50000 UT) CAPS capsule Take 50,000 Units by mouth every 7 (seven) days. Each Wednesday     No current facility-administered medications for this visit.      Musculoskeletal: Strength & Muscle Tone: within normal limits Gait & Station: normal Patient leans: N/A  Psychiatric Specialty Exam: ROS  There were no vitals taken for this visit.There is no height or weight on file to calculate BMI.  General Appearance: Casual and Well Groomed  Eye Contact:  Good  Speech:  Clear and Coherent  Volume:  Normal  Mood:  Euthymic  Affect:  Congruent  Thought Process:  Goal Directed and Descriptions of Associations: Intact  Orientation:  Full (Time, Place, and Person)  Thought Content: Logical   Suicidal Thoughts:  No  Homicidal Thoughts:  No  Memory:  Immediate;   Fair  Judgement:  Fair  Insight:  Fair  Psychomotor Activity:  Normal  Concentration:  Concentration: Good  Recall:  Good  Fund of Knowledge: Good  Language: Good  Akathisia:  Negative  Handed:  Right   AIMS (if indicated): not done  Assets:  Communication Skills Desire for Improvement Housing Transportation  ADL's:  Intact  Cognition: WNL  Sleep:  Fair   Screenings: PHQ2-9     Office Visit from 09/21/2018 in [facility] Office Visit from 06/25/2018 in [facility] Office Visit from 06/18/2018 in [facility] Office Visit from 08/28/2017 in [facility] Office Visit from 05/22/2017 in [facility]  PHQ-2 Total Score  1  0  0  0  0       Assessment and Plan:   This patient is #1 problem is that of panic disorder.  We will continue her treatment with Klonopin 0.5 mg 3 times daily which is very helpful for her general anxiety as well as for frequency of panic attacks.  Today we will discontinue the Paxil by reducing it to 10 mg for 2 weeks and then stopping it.  Once she stops it she will begin on Effexor 37.5 mg XR every morning.  Patient was told to call us if there are problems and she will be given a return appointment to see me in 7 weeks. Status of current problems: gradually improving  Labs Ordered: No orders of the defined types were placed in this encounter.   Labs Reviewed: n/a  Collateral Obtained/Records Reviewed: n/a  Plan:  Continue Lexapro 20 mg daily Increase Seroquel to 200 mg nightly Return to clinic in 3-4 months, transfer care to Dr. Liz Beach, MD 12/12/2018, 4:36 PM

## 2018-12-13 ENCOUNTER — Ambulatory Visit (INDEPENDENT_AMBULATORY_CARE_PROVIDER_SITE_OTHER): Payer: Self-pay | Admitting: Internal Medicine

## 2018-12-13 DIAGNOSIS — G8929 Other chronic pain: Secondary | ICD-10-CM

## 2018-12-13 DIAGNOSIS — M1711 Unilateral primary osteoarthritis, right knee: Secondary | ICD-10-CM

## 2018-12-13 DIAGNOSIS — F410 Panic disorder [episodic paroxysmal anxiety] without agoraphobia: Secondary | ICD-10-CM

## 2018-12-13 DIAGNOSIS — R519 Headache, unspecified: Secondary | ICD-10-CM | POA: Insufficient documentation

## 2018-12-13 DIAGNOSIS — G629 Polyneuropathy, unspecified: Secondary | ICD-10-CM

## 2018-12-13 DIAGNOSIS — R51 Headache: Secondary | ICD-10-CM

## 2018-12-13 MED ORDER — TRAMADOL HCL 50 MG PO TABS: 50.0000 mg | ORAL_TABLET | Freq: Two times a day (BID) | ORAL | 0 refills | Status: DC | PRN

## 2018-12-13 MED ORDER — GABAPENTIN 300 MG PO CAPS: 600.0000 mg | ORAL_CAPSULE | Freq: Three times a day (TID) | ORAL | 3 refills | Status: DC

## 2018-12-13 MED FILL — traMADol HCL 50 MG TABS: 50 | 23 days supply | Qty: 45 | Fill #0

## 2018-12-13 NOTE — Progress Notes (Signed)
Virtual Visit via Video Note  I connected with Sabrina [name] on 12/13/18 at 11:00 AM EDT by a video enabled telemedicine application and verified that I am speaking with the correct person using two identifiers.  Location patient: home Location provider: work office Persons participating in the virtual visit: patient, provider  I discussed the limitations of evaluation and management by telemedicine and the availability of in person appointments. The patient expressed understanding and agreed to proceed.   HPI: This appointment is to establish care, medication refills and to follow up on chronic conditions.  She used to see [name] at the [facility], but was frustrated with how frequently providers were switching.  She is a Museum/gallery conservator, never smoker, no ETOH, no drugs.  PMH as follows:  1. Panic Disorder followed by Dr. Donell Beers. She is on TID Klonopin (Rx by psych) and she is being switched from Paxil to Effexor.  2. Chronic Headaches. They are crippling. She used to be on fioricet. Was recently switched to topamax, not sure if it is working yet but she is willing to give it a try. Excedrin and goody powder will work sometimes. She gets 2-3 HAs a week.  3. Right Knee OA. She was scheduled for surgery that was cancelled due to COVID-19 pandemic. She is wondering if I would give her some tramadol until surgery can be done.  4. Bullous Pemphigoid. She gets nerve pain from this and takes gabapentin 600 TID.  In 2015 she had a cat bite to her left elbow, had osteomyelitis and multiple surgeries.  She is scheduled for colonoscopy and mammogram that have been placed on hold due to COVID-19.  She has no acute complaints today.   ROS: Constitutional: Denies fever, chills, diaphoresis, appetite change and fatigue.  HEENT: Denies photophobia, eye pain, redness, hearing loss, ear pain, congestion, sore throat, rhinorrhea, sneezing, mouth sores, trouble swallowing, neck pain, neck  stiffness and tinnitus.   Respiratory: Denies SOB, DOE, cough, chest tightness,  and wheezing.   Cardiovascular: Denies chest pain, palpitations and leg swelling.  Gastrointestinal: Denies nausea, vomiting, abdominal pain, diarrhea, constipation, blood in stool and abdominal distention.  Genitourinary: Denies dysuria, urgency, frequency, hematuria, flank pain and difficulty urinating.  Endocrine: Denies: hot or cold intolerance, sweats, changes in hair or nails, polyuria, polydipsia. Musculoskeletal: Denies myalgias, back pain, joint swelling, arthralgias and gait problem.  Skin: Denies pallor, rash and wound.  Neurological: Denies dizziness, seizures, syncope, weakness, light-headedness, numbness and headaches.  Hematological: Denies adenopathy. Easy bruising, personal or family bleeding history  Psychiatric/Behavioral: Denies suicidal ideation, mood changes, confusion, nervousness, sleep disturbance and agitation   Past Medical History:  Diagnosis Date  . Arthritis    "knees" (09/06/2017)  . Bullous pemphigus   . Cat bite 06/2014   to left elbow  . History of blood transfusion 1988   "when I had my baby"  . Muscle weakness of lower extremity 2001; 09/05/2017   "resolved after a couple weeks; ?" (09/06/2017)  . 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 Poisoning, snake bite 04/08/2016   "[place]; RUE"  . PONV (postoperative nausea and vomiting)   . Situational anxiety   . Staph infection ~ 2015   "left elbow and finger"    Past Surgical History:  Procedure Laterality Date  . APPENDECTOMY  ~ 1987  . APPLICATION OF A-CELL OF EXTREMITY Left 08/05/2015   Procedure: APPLICATION OF A-CELL OF EXTREMITY;  Surgeon: Alena Bills Dillingham, DO;  Location: [facility];  Service: Government social research officer;  Laterality: Left;  . BREAST SURGERY Right 1990   "milk duct taken out"  . DEBRIDEMENT AND CLOSURE WOUND Left 07/01/2015   Procedure: LEFT ELBOW EXCISION OF WOUND WITH PRIMARY CLOSURE 2X5 CM ;   Surgeon: Peggye Form, DO;  Location: [facility];  Service: Plastics;  Laterality: Left;  . ELBOW SURGERY Left X 23 in [place] <06/2015   from a cat bite; all I&D  . I&D EXTREMITY Left 07/08/2015   Procedure: IRRIGATION AND DEBRIDEMENT EXTREMITY, DRAINAGE OF LEFT ARM WOUND, A-CELL PLACEMENT, WOUND VAC PLACEMENT;  Surgeon: Alena Bills Dillingham, DO;  Location: [facility];  Service: Plastics;  Laterality: Left;  . INCISION AND DRAINAGE OF WOUND Left 08/05/2015   Procedure: IRRIGATION AND DEBRIDEMENT LEFT ELBOW WOUND, PLACEMENT OF ACELL;  Surgeon: Peggye Form, DO;  Location: [facility];  Service: Plastics;  Laterality: Left;  . LAPAROSCOPIC CHOLECYSTECTOMY  1998  . SKIN GRAFT Left 2016   took from anterior thigh; placed at elbow  . TONSILLECTOMY  ~ 2000  . TOTAL ABDOMINAL HYSTERECTOMY  2003  . WRIST SURGERY Right 01/2016    Family History  Problem Relation Age of Onset  . Liver disease Mother   . Dementia Mother   . Cirrhosis Mother   . Prostate cancer Father   . Colon cancer Maternal Grandmother   . Ovarian cancer Maternal Aunt     SOCIAL HX:   reports that she has never smoked. She has never used smokeless tobacco. She reports that she does not drink alcohol or use drugs.   Current Outpatient Medications:  .  clonazePAM (KLONOPIN) 0.5 MG tablet, 1  tid, Disp: 90 tablet, Rfl: 3 .  gabapentin (NEURONTIN) 300 MG capsule, Take 2 capsules (600 mg total) by mouth 3 (three) times daily. Take 2 capsules 3 times a day., Disp: 120 capsule, Rfl: 3 .  meloxicam (MOBIC) 15 MG tablet, Take 1 tablet (15 mg total) by mouth daily. (Patient not taking: Reported on 10/16/2018), Disp: 10 tablet, Rfl: 0 .  PARoxetine (PAXIL) 10 MG tablet, 1  qhs  For 2 weeks  Then D/C, Disp: 14 tablet, Rfl: 0 .  prochlorperazine (COMPAZINE) 10 MG tablet, Take 1 tablet (10 mg total) by mouth every 6 (six) hours as needed for nausea or vomiting., Disp: 30 tablet, Rfl: 2 .  topiramate  (TOPAMAX) 25 MG tablet, Take 1 tablet (25 mg total) by mouth 2 (two) times daily., Disp: 60 tablet, Rfl: 1 .  traMADol (ULTRAM) 50 MG tablet, Take 1 tablet (50 mg total) by mouth every 12 (twelve) hours as needed., Disp: 45 tablet, Rfl: 0 .  venlafaxine XR (EFFEXOR XR) 37.5 MG 24 hr capsule, 1  qam, Disp: 30 capsule, Rfl: 4 .  Vitamin D, Ergocalciferol, (DRISDOL) 1.25 MG (50000 UT) CAPS capsule, Take 50,000 Units by mouth every 7 (seven) days. Each Wednesday, Disp: , Rfl:   EXAM:   VITALS per patient if applicable: none reported  GENERAL: alert, oriented, appears well and in no acute distress  HEENT: atraumatic, conjunttiva clear, no obvious abnormalities on inspection of external nose and ears  NECK: normal movements of the head and neck  LUNGS: on inspection no signs of respiratory distress, breathing rate appears normal, no obvious gross increased work of breathing, gasping or wheezing  CV: no obvious cyanosis  MS: moves all visible extremities without noticeable abnormality  PSYCH/NEURO: pleasant and cooperative, no obvious depression or anxiety, speech and thought processing grossly intact  ASSESSMENT AND PLAN:   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 -Will allow some tramadol until sx can be completed.  Panic disorder -Followed by psych; on effexor and klonopin.  Chronic intractable headache, unspecified headache type -Will continue topamax for now.     I discussed the assessment and treatment plan with the patient. The patient was provided an opportunity to ask questions and all were answered. The patient agreed with the plan and demonstrated an understanding of the instructions.   The patient was advised to call back or seek an in-person evaluation if the symptoms worsen or if the condition fails to improve as anticipated.    Chaya Jan, MD  [facility]

## 2018-12-17 ENCOUNTER — Ambulatory Visit (INDEPENDENT_AMBULATORY_CARE_PROVIDER_SITE_OTHER): Payer: Self-pay | Admitting: Family Medicine

## 2018-12-17 ENCOUNTER — Other Ambulatory Visit: Payer: Self-pay

## 2018-12-17 DIAGNOSIS — R059 Cough, unspecified: Secondary | ICD-10-CM

## 2018-12-17 DIAGNOSIS — R05 Cough: Secondary | ICD-10-CM

## 2018-12-17 MED ORDER — HYDROCODONE-HOMATROPINE 5-1.5 MG/5ML PO SYRP: 5.0000 mL | ORAL_SOLUTION | Freq: Four times a day (QID) | ORAL | 0 refills | Status: AC | PRN

## 2018-12-17 MED FILL — HYDROCODONE-HOMATROPINE SYR: 5-1.5 | 6 days supply | Qty: 120 | Fill #0

## 2018-12-17 NOTE — Progress Notes (Signed)
Patient ID: Sabrina [name], female   DOB: 12-31-1965, 53 y.o.   MRN: [id number]  Virtual Visit via Video Note  I connected with Sabrina [name] on 12/17/18 at 10:00 AM EDT by a video enabled telemedicine application and verified that I am speaking with the correct person using two identifiers.  Location patient: home Location provider:work or home office Persons participating in the virtual visit: patient, provider  I discussed the limitations of evaluation and management by telemedicine and the availability of in person appointments. The patient expressed understanding and agreed to proceed.   HPI: Patient called with onset of upper respiratory symptoms few days ago.  She developed some sore throat and rhinorrhea and states that Saturday she had fever up to 104.  This was down to 100 yesterday and she is afebrile today.  Her main issue is cough which is mostly dry but occasionally productive.  She has had no dyspnea.  No recent travels.  No known sick contacts.  She is tried several over-the-counter medications including Delsym, Robitussin-DM, and Mucinex without relief.  Previous intolerance with Tessalon Perles.  Listed allergy to morphine but she has taken hydrocodone without difficulty in the past.  She has history of bullous pemphigoid and currently has a couple of bullous lesions on her hip and thigh which are typical of her bullous pemphigoid.  Non-smoker.  No chronic lung disease.  Requesting medication for cough.   ROS: See pertinent positives and negatives per HPI.  Past Medical History:  Diagnosis Date  . Arthritis    "knees" (09/06/2017)  . Bullous pemphigus   . Cat bite 06/2014   to left elbow  . History of blood transfusion 1988   "when I had my baby"  . Muscle weakness of lower extremity 2001; 09/05/2017   "resolved after a couple weeks; ?" (09/06/2017)  . 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 Poisoning, snake bite 04/08/2016   "[place]; RUE"  . PONV (postoperative nausea and  vomiting)   . Situational anxiety   . Staph infection ~ 2015   "left elbow and finger"    Past Surgical History:  Procedure Laterality Date  . APPENDECTOMY  ~ 1987  . APPLICATION OF A-CELL OF EXTREMITY Left 08/05/2015   Procedure: APPLICATION OF A-CELL OF EXTREMITY;  Surgeon: Peggye Formlaire S Dillingham, DO;  Location: [facility];  Service: Plastics;  Laterality: Left;  . BREAST SURGERY Right 1990   "milk duct taken out"  . DEBRIDEMENT AND CLOSURE WOUND Left 07/01/2015   Procedure: LEFT ELBOW EXCISION OF WOUND WITH PRIMARY CLOSURE 2X5 CM ;  Surgeon: Peggye Formlaire S Dillingham, DO;  Location: [facility];  Service: Plastics;  Laterality: Left;  . ELBOW SURGERY Left X 23 in [place] <06/2015   from a cat bite; all I&D  . I&D EXTREMITY Left 07/08/2015   Procedure: IRRIGATION AND DEBRIDEMENT EXTREMITY, DRAINAGE OF LEFT ARM WOUND, A-CELL PLACEMENT, WOUND VAC PLACEMENT;  Surgeon: Alena Billslaire S Dillingham, DO;  Location: [facility];  Service: Plastics;  Laterality: Left;  . INCISION AND DRAINAGE OF WOUND Left 08/05/2015   Procedure: IRRIGATION AND DEBRIDEMENT LEFT ELBOW WOUND, PLACEMENT OF ACELL;  Surgeon: Peggye Formlaire S Dillingham, DO;  Location: [facility];  Service: Plastics;  Laterality: Left;  . LAPAROSCOPIC CHOLECYSTECTOMY  1998  . SKIN GRAFT Left 2016   took from anterior thigh; placed at elbow  . TONSILLECTOMY  ~ 2000  . TOTAL ABDOMINAL HYSTERECTOMY  2003  . WRIST SURGERY Right 01/2016    Family  History  Problem Relation Age of Onset  . Liver disease Mother   . Dementia Mother   . Cirrhosis Mother   . Prostate cancer Father   . Colon cancer Maternal Grandmother   . Ovarian cancer Maternal Aunt     SOCIAL HX: Non-smoker.  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  clonazePAM (KLONOPIN) 0.5 MG tablet, 1  tid, Disp: 90 tablet, Rfl: 3 .  gabapentin (NEURONTIN) 300 MG capsule, Take 2 capsules (600 mg total) by mouth  3 (three) times daily. Take 2 capsules 3 times a day., Disp: 120 capsule, Rfl: 3 .  HYDROcodone-homatropine (HYCODAN) 5-1.5 MG/5ML syrup, Take 5 mLs by mouth every 6 (six) hours as needed for up to 10 days., Disp: 120 mL, Rfl: 0 .  meloxicam (MOBIC) 15 MG tablet, Take 1 tablet (15 mg total) by mouth daily. (Patient not taking: Reported on 10/16/2018), Disp: 10 tablet, Rfl: 0 .  PARoxetine (PAXIL) 10 MG tablet, 1  qhs  For 2 weeks  Then D/C, Disp: 14 tablet, Rfl: 0 .  prochlorperazine (COMPAZINE) 10 MG tablet, Take 1 tablet (10 mg total) by mouth every 6 (six) hours as needed for nausea or vomiting., Disp: 30 tablet, Rfl: 2 .  topiramate (TOPAMAX) 25 MG tablet, Take 1 tablet (25 mg total) by mouth 2 (two) times daily., Disp: 60 tablet, Rfl: 1 .  traMADol (ULTRAM) 50 MG tablet, Take 1 tablet (50 mg total) by mouth every 12 (twelve) hours as needed., Disp: 45 tablet, Rfl: 0 .  venlafaxine XR (EFFEXOR XR) 37.5 MG 24 hr capsule, 1  qam, Disp: 30 capsule, Rfl: 4 .  Vitamin D, Ergocalciferol, (DRISDOL) 1.25 MG (50000 UT) CAPS capsule, Take 50,000 Units by mouth every 7 (seven) days. Each Wednesday, Disp: , Rfl:   EXAM:  VITALS per patient if applicable:  GENERAL: alert, oriented, appears well and in no acute distress  HEENT: atraumatic, conjunttiva clear, no obvious abnormalities on inspection of external nose and ears  NECK: normal movements of the head and neck  LUNGS: on inspection no signs of respiratory distress, breathing rate appears normal, no obvious gross SOB, gasping or wheezing  CV: no obvious cyanosis  MS: moves all visible extremities without noticeable abnormality  PSYCH/NEURO: pleasant and cooperative, no obvious depression or anxiety, speech and thought processing grossly intact  ASSESSMENT AND PLAN:  Discussed the following assessment and plan:  Cough.  Patient states this is typical of previous bronchitis.  She did report fever 100 for Saturday but none currently and patient  appears nontoxic and in no respiratory distress.  She reports no dyspnea whatsoever  -We recommended symptomatic treatment and close observation at this time.  We agreed to send in limited Hycodan cough syrup 1 teaspoon nightly for severe cough -Follow-up immediately for any recurrent/persistent fever, shortness of breath, or any other concerns     I discussed the assessment and treatment plan with the patient. The patient was provided an opportunity to ask questions and all were answered. The patient agreed with the plan and demonstrated an understanding of the instructions.   The patient was advised to call back or seek an in-person evaluation if the symptoms worsen or if the condition fails to improve as anticipated.  Evelena Peat, MD

## 2018-12-18 ENCOUNTER — Ambulatory Visit: Payer: Self-pay | Admitting: Family Medicine

## 2018-12-24 ENCOUNTER — Telehealth: Payer: Self-pay | Admitting: Internal Medicine

## 2018-12-24 ENCOUNTER — Encounter: Payer: Self-pay | Admitting: Gastroenterology

## 2018-12-24 DIAGNOSIS — G629 Polyneuropathy, unspecified: Secondary | ICD-10-CM

## 2018-12-24 MED ORDER — GABAPENTIN 300 MG PO CAPS: 600.0000 mg | ORAL_CAPSULE | Freq: Three times a day (TID) | ORAL | 1 refills | Status: DC

## 2018-12-24 MED FILL — GABAPENTIN 300 MG CAPSULE: 300 | 20 days supply | Qty: 120 | Fill #0

## 2018-12-24 NOTE — Telephone Encounter (Signed)
Copied from CRM [id number]. Topic: Quick Communication - Rx Refill/Question >> Dec 24, 2018  8:09 AM Louie Bun, Rosey Bath D wrote: Medication:bapentin (NEURONTIN) 300 MG capsule  Has the patient contacted their pharmacy? Yes, please tell pharmacy that med was changed to 3X a day that is why she is out a week before refill date. (Agent: If no, request that the patient contact the pharmacy for the refill.) (Agent: If yes, when and what did the pharmacy advise?)  Preferred Pharmacy (with phone number or street name): [facility] - [place], [place] - [address] [place]  Agent: Please be advised that RX refills may take up to 3 business days. We ask that you follow-up with your pharmacy.

## 2018-12-24 NOTE — Telephone Encounter (Signed)
Rx sent 

## 2018-12-25 ENCOUNTER — Ambulatory Visit: Payer: Self-pay | Admitting: Internal Medicine

## 2018-12-26 ENCOUNTER — Ambulatory Visit: Payer: Self-pay | Admitting: Internal Medicine

## 2018-12-26 NOTE — Telephone Encounter (Signed)
Pt. Reports she has not had her Neurontin since Monday morning. It has been refilled and should be delivered "today or tomorrow." States she is having "withdrawals - shaking, feet are cold,can not sleep and is anxious.". Reports she took a Klonopin "and it didn't help." Unable to reach the practice - problems with phone. Once phone problem resolved, spoke with Harriett Sine and they will call pt. Back.  Answer Assessment - Initial Assessment Questions 1. SYMPTOMS: "Do you have any symptoms?"     Shaky, feet are cold, sweating, can not sleep, anxious 2. SEVERITY: If symptoms are present, ask "Are they mild, moderate or severe?"     Moderate  Protocols used: MEDICATION QUESTION CALL-A-AH

## 2018-12-26 NOTE — Telephone Encounter (Signed)
Patient calling to notify gabapentin was delivered and she just took her first dose. See nurse triage note from this morning.

## 2018-12-26 NOTE — Telephone Encounter (Signed)
noted 

## 2019-01-01 MED FILL — clonazePAM 0.5 MG TABS: 0.5 | 30 days supply | Qty: 90 | Fill #0

## 2019-01-04 ENCOUNTER — Telehealth (INDEPENDENT_AMBULATORY_CARE_PROVIDER_SITE_OTHER): Payer: Self-pay | Admitting: Orthopaedic Surgery

## 2019-01-04 ENCOUNTER — Telehealth: Payer: Self-pay | Admitting: *Deleted

## 2019-01-04 ENCOUNTER — Other Ambulatory Visit: Payer: Self-pay | Admitting: Internal Medicine

## 2019-01-04 DIAGNOSIS — M1711 Unilateral primary osteoarthritis, right knee: Secondary | ICD-10-CM

## 2019-01-04 NOTE — Telephone Encounter (Signed)
Patient called about knee pain. Message was routed to her Ortho Doctor

## 2019-01-04 NOTE — Telephone Encounter (Signed)
Patient called to see if the hospitals are starting to schedule surgeries because her knee has been "very painful and states she has difficulty walking."  Patient states her PCP gave her a prescription of Tramadol about a month ago, but states it doesn't help with the pain.  Patient is asking if "there is anything [name] can prescribe until her surgery date that is stronger than Tramadol."   Patient understood [name] was not in the office and requested a return call on Tuesday 01/08/19.

## 2019-01-05 ENCOUNTER — Other Ambulatory Visit: Payer: Self-pay

## 2019-01-05 ENCOUNTER — Ambulatory Visit (INDEPENDENT_AMBULATORY_CARE_PROVIDER_SITE_OTHER): Payer: Self-pay | Admitting: Family Medicine

## 2019-01-05 ENCOUNTER — Encounter: Payer: Self-pay | Admitting: Family Medicine

## 2019-01-05 VITALS — Resp 12

## 2019-01-05 DIAGNOSIS — F411 Generalized anxiety disorder: Secondary | ICD-10-CM

## 2019-01-05 DIAGNOSIS — L120 Bullous pemphigoid: Secondary | ICD-10-CM

## 2019-01-05 DIAGNOSIS — M1711 Unilateral primary osteoarthritis, right knee: Secondary | ICD-10-CM

## 2019-01-05 DIAGNOSIS — F410 Panic disorder [episodic paroxysmal anxiety] without agoraphobia: Secondary | ICD-10-CM

## 2019-01-05 MED ORDER — DICLOFENAC SODIUM 1 % TD GEL: 4.0000 g | Freq: Four times a day (QID) | TRANSDERMAL | 0 refills | Status: DC

## 2019-01-05 MED FILL — DICLOFENAC SODIUM 1 % GEL: 1 | 25 days supply | Qty: 400 | Fill #0

## 2019-01-05 NOTE — Progress Notes (Signed)
Virtual Visit via Video Note   I connected with [name] on 01/05/19 at 12:20 PM EDT by a video enabled telemedicine application and verified that I am speaking with the correct person using two identifiers.  Location patient: home Location provider:work or home office Persons participating in the virtual visit: patient, provider  I discussed the limitations of evaluation and management by telemedicine and the availability of in person appointments. The patient expressed understanding and agreed to proceed.   HPI: She is a 53 yo female with Hx of anxiety,OA,and headaches complaining about right knee pain. She has had problem for years. Pain is sharp,mainly medial aspect,constant, 8/10, radiated to pretibial area and sometimes to foot. Denies LE edema or erythema.  Pain is exacerbated by movement, walking, and standing. Alleviated by rest. It is interfering with sleep and aggravating anxiety. No recent trauma.  She is on Tramadol and also takes Ibuprofen OTC,she does not think the latter helps much and concerned about GI side effects. Also tried Epson salt. + Knee effusion,decreased ROM, denies knee erythema. She follows with Dr Sabrina [name], ortho. According to patient, she was supposed to have total knee replacement last month.  3 days ago run out of tramadol.  She is also complaining about tender skin lesions, blisters. According to patient, she has seen dermatologist in the past back in [place], diagnosed with bullous pemphigoid.  She states that topical steroid sometimes irritated lesions. She has not established with a dermatologist in the area. She denies fever, chills, oral lesions, body aches, abdominal pain, changes in bowel habits, or urinary symptoms.  Currently she is not tried any treatment.  She also mentioned that she has "severe" anxiety, frequent panic attacks. She is on clonazepam 0.5 mg,Effexor XR 37.5 mg, Paxil 10 mg daily. She follows with psychiatrist.   ROS: See pertinent positives and negatives per HPI.  Past Medical History:  Diagnosis Date  . Arthritis    "knees" (09/06/2017)  . Bullous pemphigus   . Cat bite 06/2014   to left elbow  . History of blood transfusion 1988   "when I had my baby"  . Muscle weakness of lower extremity 2001; 09/05/2017   "resolved after a couple weeks; ?" (09/06/2017)  . 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 Poisoning, snake bite 04/08/2016   "[place]; RUE"  . PONV (postoperative nausea and vomiting)   . Situational anxiety   . Staph infection ~ 2015   "left elbow and finger"    Past Surgical History:  Procedure Laterality Date  . APPENDECTOMY  ~ 1987  . APPLICATION OF A-CELL OF EXTREMITY Left 08/05/2015   Procedure: APPLICATION OF A-CELL OF EXTREMITY;  Surgeon: Peggye Formlaire S Dillingham, DO;  Location: [facility];  Service: Plastics;  Laterality: Left;  . BREAST SURGERY Right 1990   "milk duct taken out"  . DEBRIDEMENT AND CLOSURE WOUND Left 07/01/2015   Procedure: LEFT ELBOW EXCISION OF WOUND WITH PRIMARY CLOSURE 2X5 CM ;  Surgeon: Peggye Formlaire S Dillingham, DO;  Location: [facility];  Service: Plastics;  Laterality: Left;  . ELBOW SURGERY Left X 23 in [place] <06/2015   from a cat bite; all I&D  . I&D EXTREMITY Left 07/08/2015   Procedure: IRRIGATION AND DEBRIDEMENT EXTREMITY, DRAINAGE OF LEFT ARM WOUND, A-CELL PLACEMENT, WOUND VAC PLACEMENT;  Surgeon: Alena Billslaire S Dillingham, DO;  Location: [facility];  Service: Plastics;  Laterality: Left;  . INCISION AND DRAINAGE OF WOUND Left 08/05/2015   Procedure: IRRIGATION AND DEBRIDEMENT LEFT ELBOW WOUND,  PLACEMENT OF ACELL;  Surgeon: Peggye Form, DO;  Location: [facility];  Service: Plastics;  Laterality: Left;  . LAPAROSCOPIC CHOLECYSTECTOMY  1998  . SKIN GRAFT Left 2016   took from anterior thigh; placed at elbow  . TONSILLECTOMY  ~ 2000  . TOTAL ABDOMINAL HYSTERECTOMY  2003  . WRIST SURGERY Right 01/2016     Family History  Problem Relation Age of Onset  . Liver disease Mother   . Dementia Mother   . Cirrhosis Mother   . Prostate cancer Father   . Colon cancer Maternal Grandmother   . Ovarian cancer Maternal Aunt     Social History   Socioeconomic History  . Marital status: Divorced    Spouse name: Not on file  . Number of children: Not on file  . Years of education: Not on file  . Highest education level: Not on file  Occupational History  . Not on file  Social Needs  . Financial resource strain: Not on file  . Food insecurity:    Worry: Not on file    Inability: Not on file  . Transportation needs:    Medical: Not on file    Non-medical: Not on file  Tobacco Use  . Smoking status: Never Smoker  . Smokeless tobacco: Never Used  Substance and Sexual Activity  . Alcohol use: No    Alcohol/week: 0.0 standard drinks    Comment: 09/06/2017 "I'll have a glass of wine a couple times/year; maybe"  . Drug use: No  . Sexual activity: Not Currently  Lifestyle  . Physical activity:    Days per week: Not on file    Minutes per session: Not on file  . Stress: Not on file  Relationships  . Social connections:    Talks on phone: Not on file    Gets together: Not on file    Attends religious service: Not on file    Active member of club or organization: Not on file    Attends meetings of clubs or organizations: Not on file    Relationship status: Not on file  . Intimate partner violence:    Fear of current or ex partner: Not on file    Emotionally abused: Not on file    Physically abused: Not on file    Forced sexual activity: Not on file  Other Topics Concern  . Not on file  Social History Narrative  . Not on file    Current Outpatient Medications:  .  clonazePAM (KLONOPIN) 0.5 MG tablet, 1  tid, Disp: 90 tablet, Rfl: 3 .  diclofenac sodium (VOLTAREN) 1 % GEL, Apply 4 g topically 4 (four) times daily., Disp: 4 Tube, Rfl: 0 .  gabapentin (NEURONTIN) 300 MG capsule, Take 2  capsules (600 mg total) by mouth 3 (three) times daily. Take 2 capsules 3 times a day., Disp: 120 capsule, Rfl: 1 .  PARoxetine (PAXIL) 10 MG tablet, 1  qhs  For 2 weeks  Then D/C, Disp: 14 tablet, Rfl: 0 .  prochlorperazine (COMPAZINE) 10 MG tablet, Take 1 tablet (10 mg total) by mouth every 6 (six) hours as needed for nausea or vomiting., Disp: 30 tablet, Rfl: 2 .  topiramate (TOPAMAX) 25 MG tablet, Take 1 tablet (25 mg total) by mouth 2 (two) times daily., Disp: 60 tablet, Rfl: 1 .  traMADol (ULTRAM) 50 MG tablet, Take 1 tablet (50 mg total) by mouth every 12 (twelve) hours as needed., Disp: 45 tablet, Rfl: 0 .  venlafaxine XR (EFFEXOR XR) 37.5 MG 24 hr capsule, 1  qam, Disp: 30 capsule, Rfl: 4 .  Vitamin D, Ergocalciferol, (DRISDOL) 1.25 MG (50000 UT) CAPS capsule, Take 50,000 Units by mouth every 7 (seven) days. Each Wednesday, Disp: , Rfl:   EXAM:  VITALS per patient if applicable:Resp 12   GENERAL: alert, oriented, appears well and in no acute distress  HEENT: atraumatic, conjunctiva clear, no obvious facial abnormalities on inspection.  NECK: normal movements of the head and neck  LUNGS: on inspection no signs of respiratory distress, breathing rate appears normal, no obvious gross SOB, gasping or wheezing  CV: no obvious cyanosis  MS: moves all visible extremities without noticeable deformity. Right knee with moderated effusion,no erythema.Limitation of flexion. No LE edema or erythema.  SKIN: Left lower abdomen and under groin:I do not appreciate vesicular lesions or bullae.She has hyperpigmentation and scaring changes from prior lesions.2 rounded hypopigmented lesions resulted from recent burst of bullae. See picture.  PSYCH/NEURO: pleasant and cooperative, no obvious depression,she is anxious. Speech and thought processing grossly intact      ASSESSMENT AND PLAN:  Discussed the following assessment and plan:  Osteoarthritis of right knee, unspecified osteoarthritis  type - Plan: diclofenac sodium (VOLTAREN) 1 % GEL Pending TKR. Recommend RLE elevation. Topical Voltaren qid, she reports no allergic to Diclofenac. Naproxen caused burning epigastric sensation and Mobic aggravated anxiety. Continue Tramadol,explained that she should request Rx from PCP. Continue following with ortho.  Bullous pemphigoid - Plan: Ambulatory referral to Dermatology She is not interested in oral Prednisone. According to patient,she was told "there is not much that can be done." Dermatology referral placed. Instructed about warning signs.  Generalized anxiety disorder with panic attacks Problem does not seem to be well controlled. Continue following with psychiatrist.  29 min face to face OV. > 50% was dedicated to discussion of Dx, prognosis, treatment options, and some side effects of medications.  I discussed the assessment and treatment plan with the patient. She was provided an opportunity to ask questions and all were answered. The patient agreed with the plan and demonstrated an understanding of the instructions.   The patient was advised to call back or seek an in-person evaluation if the symptoms worsen or if the condition fails to improve as anticipated.    Return if symptoms worsen or fail to improve.    Chrisoula Zegarra [place], MD

## 2019-01-06 ENCOUNTER — Encounter (HOSPITAL_COMMUNITY): Payer: Self-pay | Admitting: Emergency Medicine

## 2019-01-06 ENCOUNTER — Encounter (HOSPITAL_COMMUNITY): Payer: Self-pay

## 2019-01-06 ENCOUNTER — Other Ambulatory Visit: Payer: Self-pay

## 2019-01-06 ENCOUNTER — Ambulatory Visit (HOSPITAL_COMMUNITY)
Admission: EM | Admit: 2019-01-06 | Discharge: 2019-01-06 | Disposition: A | Discharge status: Home / Self Care | Payer: Self-pay | Attending: Emergency Medicine | Admitting: Emergency Medicine

## 2019-01-06 ENCOUNTER — Emergency Department (HOSPITAL_COMMUNITY)
Admission: EM | Admit: 2019-01-06 | Discharge: 2019-01-06 | Disposition: A | Discharge status: Home / Self Care | Payer: Self-pay | Attending: Emergency Medicine | Admitting: Emergency Medicine

## 2019-01-06 DIAGNOSIS — M545 Low back pain, unspecified: Secondary | ICD-10-CM

## 2019-01-06 DIAGNOSIS — Z9049 Acquired absence of other specified parts of digestive tract: Secondary | ICD-10-CM | POA: Insufficient documentation

## 2019-01-06 DIAGNOSIS — L120 Bullous pemphigoid: Secondary | ICD-10-CM | POA: Insufficient documentation

## 2019-01-06 DIAGNOSIS — F419 Anxiety disorder, unspecified: Secondary | ICD-10-CM | POA: Insufficient documentation

## 2019-01-06 DIAGNOSIS — F329 Major depressive disorder, single episode, unspecified: Secondary | ICD-10-CM | POA: Insufficient documentation

## 2019-01-06 DIAGNOSIS — R202 Paresthesia of skin: Secondary | ICD-10-CM | POA: Insufficient documentation

## 2019-01-06 LAB — CBC WITH DIFFERENTIAL/PLATELET
Abs Immature Granulocytes: 0.01 10*3/uL (ref 0.00–0.07)
Basophils Absolute: 0 10*3/uL (ref 0.0–0.1)
Basophils Relative: 0 %
Eosinophils Absolute: 0 10*3/uL (ref 0.0–0.5)
Eosinophils Relative: 1 %
HCT: 38.7 % (ref 36.0–46.0)
Hemoglobin: 12.9 g/dL (ref 12.0–15.0)
Immature Granulocytes: 0 %
Lymphocytes Relative: 38 %
Lymphs Abs: 1.4 10*3/uL (ref 0.7–4.0)
MCH: 31.5 pg (ref 26.0–34.0)
MCHC: 33.3 g/dL (ref 30.0–36.0)
MCV: 94.4 fL (ref 80.0–100.0)
Monocytes Absolute: 0.4 10*3/uL (ref 0.1–1.0)
Monocytes Relative: 11 %
Neutro Abs: 1.8 10*3/uL (ref 1.7–7.7)
Neutrophils Relative %: 50 %
Platelets: 269 10*3/uL (ref 150–400)
RBC: 4.1 MIL/uL (ref 3.87–5.11)
RDW: 12.7 % (ref 11.5–15.5)
WBC: 3.7 10*3/uL — ABNORMAL LOW (ref 4.0–10.5)
nRBC: 0 % (ref 0.0–0.2)

## 2019-01-06 LAB — COMPREHENSIVE METABOLIC PANEL
ALT: 11 U/L (ref 0–44)
AST: 17 U/L (ref 15–41)
Albumin: 4.3 g/dL (ref 3.5–5.0)
Alkaline Phosphatase: 68 U/L (ref 38–126)
Anion gap: 10 (ref 5–15)
BUN: 27 mg/dL — ABNORMAL HIGH (ref 6–20)
CO2: 19 mmol/L — ABNORMAL LOW (ref 22–32)
Calcium: 9.5 mg/dL (ref 8.9–10.3)
Chloride: 110 mmol/L (ref 98–111)
Creatinine, Ser: 0.99 mg/dL (ref 0.44–1.00)
GFR calc Af Amer: >60 mL/min (ref >60)
GFR calc non Af Amer: >60 mL/min (ref >60)
Glucose, Bld: 99 mg/dL (ref 70–99)
Potassium: 3.6 mmol/L (ref 3.5–5.1)
Sodium: 139 mmol/L (ref 135–145)
Total Bilirubin: 0.5 mg/dL (ref 0.3–1.2)
Total Protein: 7.3 g/dL (ref 6.5–8.1)

## 2019-01-06 LAB — I-STAT BETA HCG BLOOD, ED (MC, WL, AP ONLY): I-stat hCG, quantitative: <5 m[IU]/mL (ref <5)

## 2019-01-06 LAB — ACETAMINOPHEN LEVEL: Acetaminophen (Tylenol), Serum: <10 ug/mL — ABNORMAL LOW (ref 10–30)

## 2019-01-06 LAB — ETHANOL: Alcohol, Ethyl (B): <10 mg/dL (ref <10)

## 2019-01-06 LAB — LIPASE, BLOOD: Lipase: 50 U/L (ref 11–51)

## 2019-01-06 LAB — SALICYLATE LEVEL: Salicylate Lvl: <7 mg/dL (ref 2.8–30.0)

## 2019-01-06 MED ORDER — ACETAMINOPHEN 325 MG PO TABS
650.0000 mg | ORAL_TABLET | Freq: Once | ORAL | Status: AC
  Administered 2019-01-06: 650 mg via ORAL
  Filled 2019-01-06: qty 2

## 2019-01-06 MED ORDER — SODIUM CHLORIDE 0.9 % IV BOLUS
1000.0000 mL | Freq: Once | INTRAVENOUS | Status: AC
  Administered 2019-01-06: 1000 mL via INTRAVENOUS

## 2019-01-06 MED ORDER — FAMOTIDINE IN NACL 20-0.9 MG/50ML-% IV SOLN
20.0000 mg | Freq: Once | INTRAVENOUS | Status: AC
  Administered 2019-01-06: 20 mg via INTRAVENOUS
  Filled 2019-01-06: qty 50

## 2019-01-06 MED ORDER — KETOROLAC TROMETHAMINE 60 MG/2ML IM SOLN
60.0000 mg | Freq: Once | INTRAMUSCULAR | Status: AC
  Administered 2019-01-06: 60 mg via INTRAMUSCULAR

## 2019-01-06 MED ORDER — LIDOCAINE VISCOUS HCL 2 % MT SOLN
15.0000 mL | Freq: Once | OROMUCOSAL | Status: AC
  Administered 2019-01-06: 15 mL via OROMUCOSAL
  Filled 2019-01-06: qty 15

## 2019-01-06 MED ORDER — METOCLOPRAMIDE HCL 5 MG/ML IJ SOLN
10.0000 mg | Freq: Once | INTRAMUSCULAR | Status: AC
  Administered 2019-01-06: 16:00:00 10 mg via INTRAMUSCULAR
  Filled 2019-01-06: qty 2

## 2019-01-06 MED ORDER — KETOROLAC TROMETHAMINE 60 MG/2ML IM SOLN
INTRAMUSCULAR | Status: AC
  Filled 2019-01-06: qty 2

## 2019-01-06 NOTE — ED Triage Notes (Signed)
Patient presents to Urgent Care with complaints of rash/blisters on left flank and groin, recurrent for several days. Patient states she has an autoimmune disorder that causes this blister flare up every now and then when she has increased stress. Pt denies fever, pt has been putting silvadene on the rash.

## 2019-01-06 NOTE — ED Notes (Addendum)
Pt stated "my legs haven't gone paralyzed yet". Earlier pt had stated she was not able to ambulate d/t legs been numb "I can't feel them. I can't walk". Pt voiced understanding to follow up w/her dr for MRI if needed. Pt ambulatory to w/c w/o difficulty. Pt's family member coming to ED to pick up pt. Pt escorted via w/c to lobby.

## 2019-01-06 NOTE — ED Notes (Signed)
Pt aware MRI should be sending for transport soon.

## 2019-01-06 NOTE — Progress Notes (Signed)
CSW noted there was an available wheelchair but it needed minor repairs to be appropriately safe. CSW contacted ED secretary and had a member of facilities come to repair wheelchair, CSW and ED secretary informed that a patient was currently awaiting. CSW will follow up once wheelchair wheel locks have been secured.  Tenna Delaine, LCSW, LCAS-A Clinical Social Worker II [phone]

## 2019-01-06 NOTE — ED Notes (Signed)
IV Team in w/pt.  

## 2019-01-06 NOTE — ED Notes (Signed)
ED Provider at bedside. 

## 2019-01-06 NOTE — ED Notes (Signed)
MRI aware pt has IV now. A Harris, PA, aware pt continues to c/o pain.

## 2019-01-06 NOTE — ED Triage Notes (Signed)
Patient presents to the ED by EMS with c/o flank pain and groin pain for several days. According to the medic they were unaware that the patient went to the UC this morning. She was picked up from her apartment and brought to our facility with the same symptoms. Pt a/o x4 and ambulatory.

## 2019-01-06 NOTE — ED Notes (Signed)
Joey, SW, brought pt w/c. Placed in pt's room.

## 2019-01-06 NOTE — Discharge Instructions (Signed)
Declines oral steroid today Continue with topical steroid cream, and silvadene to prevent infection Toradol shot given in office for pain Follow up with PCP tomorrow for further evaluation and management if symptoms persists Return or go to the ER if you have any new or worsening symptoms such as fever, chills, nausea, vomiting, redness, swelling, discharge, if symptoms do not improve with medications, etc..Marland Kitchen

## 2019-01-06 NOTE — ED Notes (Signed)
IV attempted by this RN uncussesful. Blood collected, asked another RN to attempt.

## 2019-01-06 NOTE — ED Provider Notes (Signed)
[id number] [facility]   [id number] 01/06/19 Arrival Time: 1001  CC: Rash  SUBJECTIVE:  Sabrina [name] is a 53 y.o. female hx significant for bullous pemphigoid, arthritis, H/O osteomyelitis of elbow, who presents with a bullous pemphigoid flare to left flank and groin x 2-3 weeks. Was seen by dermatologist in [place].  Has not established care with dermatology here.  Had an e-visit with [facility]Betty at [facility] for this flare-up.  Offered steroids but declined, was given a referral to dermatology and instructed to follow up with PCP if symptoms worsened. Describes the flare up as painful and constant.  Pain is 6/10.  Has been using topical steroid cream, and silvadene cream without relief.  Reports similar symptoms in the past that improved with tramadol.   Report oral steroid made flare up worse in the past.  Denies fever, chills, nausea, vomiting, erythema, swelling, discharge, SOB, chest pain, abdominal pain, changes in bowel or bladder function.    ROS: As per HPI.  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HCC)    Poisoning, snake bite 04/08/2016   "[place]; RUE"   PONV (postoperative nausea and vomiting)    Situational anxiety    Staph infection ~ 2015   "left elbow and finger"   Past Surgical History:  Procedure Laterality Date   APPENDECTOMY  ~ 1987   APPLICATION OF A-CELL OF EXTREMITY Left 08/05/2015   Procedure: APPLICATION OF A-CELL OF EXTREMITY;  Surgeon: Peggye Formlaire S Dillingham, DO;  Location: [facility];  Service: Plastics;  Laterality: Left;   BREAST SURGERY Right 1990   "milk duct taken out"   DEBRIDEMENT AND CLOSURE WOUND Left 07/01/2015   Procedure: LEFT  ELBOW EXCISION OF WOUND WITH PRIMARY CLOSURE 2X5 CM ;  Surgeon: Peggye Formlaire S Dillingham, DO;  Location: [facility];  Service: Plastics;  Laterality: Left;   ELBOW SURGERY Left X 23 in [place] <06/2015   from a cat bite; all I&D   I&D EXTREMITY Left 07/08/2015   Procedure: IRRIGATION AND DEBRIDEMENT EXTREMITY, DRAINAGE OF LEFT ARM WOUND, A-CELL PLACEMENT, WOUND VAC PLACEMENT;  Surgeon: Alena Billslaire S Dillingham, DO;  Location: [facility];  Service: Plastics;  Laterality: Left;   INCISION AND DRAINAGE OF WOUND Left 08/05/2015   Procedure: IRRIGATION AND DEBRIDEMENT LEFT ELBOW WOUND, PLACEMENT OF ACELL;  Surgeon: Peggye Formlaire S Dillingham, DO;  Location: [facility];  Service: Plastics;  Laterality: Left;   LAPAROSCOPIC CHOLECYSTECTOMY  1998   SKIN GRAFT Left 2016   took from anterior thigh; placed at elbow   TONSILLECTOMY  ~ 2000   TOTAL ABDOMINAL HYSTERECTOMY  2003   WRIST SURGERY Right 01/2016   Allergies  Allergen Reactions   Zofran [Ondansetron Hcl] Hives and Other (See Comments)    Spots around iv site   Droperidol Palpitations   Flexeril [Cyclobenzaprine] Anxiety   Morphine And Related Itching   Tessalon [Benzonatate] Other (See Comments)    Watery eyes   Voltaren [Diclofenac Sodium] Rash   No current facility-administered medications on file prior to encounter.    Current Outpatient Medications on File Prior to Encounter  Medication Sig Dispense Refill   clonazePAM (  KLONOPIN) 0.5 MG tablet 1  tid 90 tablet 3   diclofenac sodium (VOLTAREN) 1 % GEL Apply 4 g topically 4 (four) times daily. 4 Tube 0   gabapentin (NEURONTIN) 300 MG capsule Take 2 capsules (600 mg total) by mouth 3 (three) times daily. Take 2 capsules 3 times a day. 120 capsule 1   PARoxetine (PAXIL) 10 MG tablet 1  qhs  For 2 weeks  Then D/C 14 tablet 0   prochlorperazine (COMPAZINE) 10 MG tablet Take 1 tablet (10 mg total) by mouth every 6 (six) hours as needed for nausea or vomiting. 30  tablet 2   topiramate (TOPAMAX) 25 MG tablet Take 1 tablet (25 mg total) by mouth 2 (two) times daily. 60 tablet 1   traMADol (ULTRAM) 50 MG tablet Take 1 tablet (50 mg total) by mouth every 12 (twelve) hours as needed. 45 tablet 0   venlafaxine XR (EFFEXOR XR) 37.5 MG 24 hr capsule 1  qam 30 capsule 4   Vitamin D, Ergocalciferol, (DRISDOL) 1.25 MG (50000 UT) CAPS capsule Take 50,000 Units by mouth every 7 (seven) days. Each Wednesday     Social History   Socioeconomic History   Marital status: Divorced    Spouse name: Not on file   Number of children: Not on file   Years of education: Not on file   Highest education level: Not on file  Occupational History   Not on file  Social Needs   Financial resource strain: Not on file   Food insecurity:    Worry: Not on file    Inability: Not on file   Transportation needs:    Medical: Not on file    Non-medical: Not on file  Tobacco Use   Smoking status: Never Smoker   Smokeless tobacco: Never Used  Substance and Sexual Activity   Alcohol use: No    Alcohol/week: 0.0 standard drinks    Comment: 09/06/2017 "I'll have a glass of wine a couple times/year; maybe"   Drug use: No   Sexual activity: Not Currently  Lifestyle   Physical activity:    Days per week: Not on file    Minutes per session: Not on file   Stress: Not on file  Relationships   Social connections:    Talks on phone: Not on file    Gets together: Not on file    Attends religious service: Not on file    Active member of club or organization: Not on file    Attends meetings of clubs or organizations: Not on file    Relationship status: Not on file   Intimate partner violence:    Fear of current or ex partner: Not on file    Emotionally abused: Not on file    Physically abused: Not on file    Forced sexual activity: Not on file  Other Topics Concern   Not on file  Social History Narrative   Not on file   Family History  Problem Relation  Age of Onset   Liver disease Mother    Dementia Mother    Cirrhosis Mother    Prostate cancer Father    Colon cancer Maternal Grandmother    Ovarian cancer Maternal Aunt     OBJECTIVE: Vitals:   01/06/19 1013 01/06/19 1049  BP: (S) (!) 92/58 130/84  Pulse: 97   Resp: 17   Temp: 98.2 F (36.8 C)   TempSrc: Oral   SpO2: 100%     General appearance: alert;  no distress Head: NCAT Lungs: clear to auscultation bilaterally Heart: regular rate and rhythm.  Radial pulse 2+ bilaterally Extremities: no edema Skin: warm and dry; two healing circular mildly erythematous vesicular lesions localized to left groin/ lower left abdomen, two rounded hypopigmented lesions also present; NTTP, no obvious drainage or discharge; multiple scars appreciated to left abdomen, posterior trunk, and left lower extremity Psychological: alert and cooperative; normal mood and affect  ASSESSMENT & PLAN:  1. Bullous pemphigoid    Discussed patient case with Dr. Delton See.  Recommended toradol shot and follow up with PCP.   Meds ordered this encounter  Medications   ketorolac (TORADOL) injection 60 mg    Declines oral steroid today Continue with topical steroid cream, and silvadene to prevent infection Toradol shot given in office for pain Follow up with PCP tomorrow for further evaluation and management if symptoms persists Return or go to the ER if you have any new or worsening symptoms such as fever, chills, nausea, vomiting, redness, swelling, discharge, if symptoms do not improve with medications, etc...  Reviewed expectations re: course of current medical issues. Questions answered. Outlined signs and symptoms indicating need for more acute intervention. Patient verbalized understanding. After Visit Summary given.   Rennis Harding, PA-C 01/06/19 1345

## 2019-01-06 NOTE — ED Notes (Addendum)
Pt states she needs to leave d/t "I was just told that my father had a heart attack and is not expected to make it so I need to leave". States her family is driving to be with him tonight so she is unable to wait for MRI. States she will schedule it for Outpt. States ride is 7 hours long and she feels she is able to make the trip. MRI aware to d/c MRI order.

## 2019-01-06 NOTE — Care Management (Signed)
ED CM noted CM consult concerning DME,  Patient does not have insurance coverage for w/c.  CM will check in the DME donation stock if a w/c is available/

## 2019-01-06 NOTE — Progress Notes (Signed)
CSW and ED secretary received repaired wheel chair from facilities and brought the wheel chair to the nurses station on green. CSW signing off, please re-consult for future social work needs.  Tenna Delaine, LCSW, LCAS-A Clinical Social Worker II [phone]

## 2019-01-06 NOTE — ED Notes (Signed)
Pt vomited after taking Tylenol and Viscous Licodaine. Reglan given. Pt tolerated well.

## 2019-01-06 NOTE — ED Notes (Signed)
Pt aware of delay w/IV Team. Requested urine specimen - states "I just can't go. I can't walk".

## 2019-01-06 NOTE — ED Provider Notes (Addendum)
3:04 PM taken in sign out from [facility] Patient with hx of psychogenic leg weakness and bullous pemphigoid. Patient seen at Urgent care, refused oral steroids suddenly weak in the left, and now the Right. States she can't: "feel myself pee." Currently awaiting awaiting MRI T and L spine with and without. Also having sudden onset burning epigastric abdominal pain. Given Pepcid.    6:13 PM Patient repeatedly asking for narcotic pain medications during the visit. Told her nurse Kriste Basque that she is going to have to leave immediately because unfortunately her father has a heart attack and is not expected to survive and she is to leave with family.  This apparently happened during the visit.  Patient states that she will just schedule an outpatient MRI. Patient ambulatory from bed to wheel chair at discharge without evidence of weakness, foot drop or abnormal gait.   Arthor Captain, PA-C 01/06/19 1815    Arthor Captain, PA-C 01/06/19 1827    Wynetta Fines, MD 01/06/19 [phone]

## 2019-01-06 NOTE — Discharge Instructions (Signed)
SEEK IMMEDIATE MEDICAL ATTENTION IF: New numbness, tingling, weakness, or problem with the use of your arms or legs.  Severe back pain not relieved with medications.  Change in bowel or bladder control.  Increasing pain in any areas of the body (such as chest or abdominal pain).  Shortness of breath, dizziness or fainting.  Nausea (feeling sick to your stomach), vomiting, fever, or sweats.  

## 2019-01-06 NOTE — ED Provider Notes (Signed)
[facility] EMERGENCY DEPARTMENT Provider Note   CSN: [id number] Arrival date & time: 01/06/19  1216    History   Chief Complaint Chief Complaint  Patient presents with   Flank Pain    HPI TACY CHAVIS is a 53 y.o. female with extensive medical history including bullous pemphigus presenting today with multiple complaints.  Patient reports that for the past week she has had a flare of her bullous pemphigus.  She reports that she has had lesions to her left back for some time however has noticed new lesions recently spreading down her left back and to her left hip.  Patient reports that these are not painful however they do cause her concern.  She presented to urgent care today and time declined steroid medications as she was concerned that this was a carrier condition worsened.  Patient reports that on her way home from the urgent care she developed sudden left lower extremity numbness and weakness.  EMS was called to bring her back to the emergency department.  Patient reports that she now has numbness and tingling to bilateral lower extremities as well as saddle area paresthesias she denies loss of bladder of her bowel or bladder.  Patient denies any injury or trauma and states that this is happened to her in December 2018 at which time she was admitted to the hospital.  On further history patient endorses left-sided back pain that has been chronic for her however she feels that this is also been worsened over the last 1 week describes an aching that is constant worse with palpation and movement without alleviating factors.     HPI  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HCC)     Poisoning, snake bite 04/08/2016   "[place]; RUE"   PONV (postoperative nausea and vomiting)    Situational anxiety    Staph infection ~ 2015   "left elbow and finger"    Patient Active Problem List   Diagnosis Date Noted   Osteoarthritis of right knee 12/13/2018   Panic disorder 12/13/2018   Chronic headaches 12/13/2018   Weakness 09/06/2017   Depression 09/06/2017   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   Generalized anxiety disorder with panic attacks 09/06/2017   Right hand pain    Wrist swelling    Cellulitis of right upper extremity 04/11/2016   Elbow pain 07/20/2015   Anxiety 07/20/2015   Tachycardia 07/04/2015   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HCC)    Skin ulcer of upper arm, limited to breakdown of skin (HCC) 07/01/2015    Past Surgical History:  Procedure Laterality Date   APPENDECTOMY  ~ 1987   APPLICATION OF A-CELL OF EXTREMITY Left 08/05/2015   Procedure: APPLICATION OF A-CELL OF EXTREMITY;  Surgeon: Peggye Form, DO;  Location: [facility];  Service: Plastics;  Laterality: Left;   BREAST SURGERY Right 1990   "milk duct taken out"   DEBRIDEMENT AND CLOSURE WOUND Left 07/01/2015   Procedure: LEFT ELBOW EXCISION OF WOUND WITH PRIMARY CLOSURE 2X5 CM ;  Surgeon: Peggye Form, DO;  Location: [facility];  Service: Plastics;  Laterality: Left;   ELBOW SURGERY  Left X 23 in [place] <06/2015   from a cat bite; all I&D   I&D EXTREMITY Left 07/08/2015   Procedure: IRRIGATION AND DEBRIDEMENT EXTREMITY, DRAINAGE OF LEFT ARM WOUND, A-CELL PLACEMENT, WOUND VAC PLACEMENT;  Surgeon: Alena Bills Dillingham, DO;  Location: [facility];  Service: Plastics;  Laterality: Left;   INCISION AND DRAINAGE OF WOUND Left 08/05/2015   Procedure: IRRIGATION AND DEBRIDEMENT LEFT ELBOW WOUND, PLACEMENT OF ACELL;  Surgeon: Peggye Form, DO;  Location: [facility];  Service: Plastics;  Laterality: Left;   LAPAROSCOPIC  CHOLECYSTECTOMY  1998   SKIN GRAFT Left 2016   took from anterior thigh; placed at elbow   TONSILLECTOMY  ~ 2000   TOTAL ABDOMINAL HYSTERECTOMY  2003   WRIST SURGERY Right 01/2016     OB History   No obstetric history on file.      Home Medications    Prior to Admission medications   Medication Sig Start Date End Date Taking? Authorizing Provider  clonazePAM (KLONOPIN) 0.5 MG tablet 1  tid Patient taking differently: Take 1 mg by mouth 3 (three) times daily.  12/12/18  Yes Plovsky, Earvin Hansen, MD  gabapentin (NEURONTIN) 300 MG capsule Take 2 capsules (600 mg total) by mouth 3 (three) times daily. Take 2 capsules 3 times a day. Patient taking differently: Take 600 mg by mouth 3 (three) times daily.  12/24/18  Yes Philip Aspen, Limmie Patricia, MD  venlafaxine XR (EFFEXOR XR) 37.5 MG 24 hr capsule 1  qam Patient taking differently: Take 37.5 mg by mouth daily with breakfast.  12/12/18  Yes Plovsky, Earvin Hansen, MD  Vitamin D, Ergocalciferol, (DRISDOL) 1.25 MG (50000 UT) CAPS capsule Take 50,000 Units by mouth every Wednesday.    Yes [provider]  diclofenac sodium (VOLTAREN) 1 % GEL Apply 4 g topically 4 (four) times daily. Patient not taking: Reported on 01/06/2019 01/05/19   [place], Betty G, MD  PARoxetine (PAXIL) 10 MG tablet 1  qhs  For 2 weeks  Then D/C Patient not taking: Reported on 01/06/2019 12/12/18   Archer Asa, MD  prochlorperazine (COMPAZINE) 10 MG tablet Take 1 tablet (10 mg total) by mouth every 6 (six) hours as needed for nausea or vomiting. Patient not taking: Reported on 01/06/2019 09/21/18   [name], FNP  topiramate (TOPAMAX) 25 MG tablet Take 1 tablet (25 mg total) by mouth 2 (two) times daily. Patient not taking: Reported on 01/06/2019 12/11/18   [name], FNP  traMADol (ULTRAM) 50 MG tablet Take 1 tablet (50 mg total) by mouth every 12 (twelve) hours as needed. Patient not taking: Reported on 01/06/2019 12/13/18   Philip Aspen, Limmie Patricia, MD     Family History Family History  Problem Relation Age of Onset   Liver disease Mother    Dementia Mother    Cirrhosis Mother    Prostate cancer Father    Colon cancer Maternal Grandmother    Ovarian cancer Maternal Aunt     Social History Social History   Tobacco Use   Smoking status: Never Smoker   Smokeless tobacco: Never Used  Substance Use Topics   Alcohol use: No    Alcohol/week: 0.0 standard drinks    Comment: 09/06/2017 "I'll have a glass of wine a couple times/year; maybe"   Drug use: No     Allergies   Zofran [ondansetron hcl]; Droperidol; Flexeril [cyclobenzaprine]; Morphine and related; Tessalon [benzonatate]; and Voltaren [diclofenac sodium]   Review of Systems Review of Systems  Constitutional:  Negative.  Negative for chills and fever.  Respiratory: Negative.  Negative for cough and shortness of breath.   Cardiovascular: Negative.  Negative for chest pain.  Gastrointestinal: Negative.  Negative for abdominal pain, diarrhea, nausea and vomiting.  Musculoskeletal: Positive for back pain. Negative for neck pain.  Skin: Positive for rash.  Neurological: Positive for weakness and numbness. Negative for dizziness, syncope and headaches.  All other systems reviewed and are negative.   Physical Exam Updated Vital Signs BP 90/66 (BP Location: Right Arm)    Pulse (!) 107    Temp 98.7 F (37.1 C)    Resp 16    Ht  (1.626 m)    Wt 77.1 kg    SpO2 100%    BMI 29.18 kg/m   Physical Exam Constitutional:      General: She is not in acute distress.    Appearance: Normal appearance. She is well-developed. She is not ill-appearing or diaphoretic.  HENT:     Head: Normocephalic and atraumatic.     Right Ear: External ear normal.     Left Ear: External ear normal.     Nose: Nose normal.  Eyes:     General: Vision grossly intact. Gaze aligned appropriately.     Pupils: Pupils are equal, round, and reactive to light.  Neck:     Musculoskeletal:  Normal range of motion.     Trachea: Trachea and phonation normal. No tracheal deviation.  Cardiovascular:     Pulses:          Dorsalis pedis pulses are 2+ on the right side and 2+ on the left side.       Posterior tibial pulses are 2+ on the right side and 2+ on the left side.  Pulmonary:     Effort: Pulmonary effort is normal. No respiratory distress.  Abdominal:     General: There is no distension.     Palpations: Abdomen is soft.     Tenderness: There is no abdominal tenderness. There is no guarding or rebound.  Musculoskeletal: Normal range of motion.       Back:     Comments: No midline C/T/L spinal tenderness to palpation, no deformity, crepitus, or step-off noted. No sign of injury to the neck or back.  Patient with mild left-sided muscular tenderness of the back consistently reproducible.   Feet:     Right foot:     Protective Sensation: 5 sites tested. 0 sites sensed.     Left foot:     Protective Sensation: 5 sites tested. 0 sites sensed.  Skin:    General: Skin is warm and dry.       Neurological:     Mental Status: She is alert.     GCS: GCS eye subscore is 4. GCS verbal subscore is 5. GCS motor subscore is 6.     Deep Tendon Reflexes:     Reflex Scores:      Patellar reflexes are 2+ on the right side and 2+ on the left side.      Achilles reflexes are 2+ on the right side and 2+ on the left side.    Comments: Speech is clear and goal oriented, follows commands Major Cranial nerves without deficit, no facial droop Normal strength in upper extremities bilaterally.  Patient with decreased strength with plantar and dorsiflexion bilaterally. Deep tendon reflexes intact to bilateral patella and Achilles, no clonus of the feet Patient endorses diminished sensation to bilateral feet in all distributions, normal  sensation to bilateral upper extremities Moves upper extremities without ataxia, coordination intact  Psychiatric:        Mood and Affect: Mood is anxious.         Behavior: Behavior normal.    ED Treatments / Results  Labs (all labs ordered are listed, but only abnormal results are displayed) Labs Reviewed  COMPREHENSIVE METABOLIC PANEL - Abnormal; Notable for the following components:      Result Value   CO2 19 (*)    BUN 27 (*)    All other components within normal limits  ACETAMINOPHEN LEVEL - Abnormal; Notable for the following components:   Acetaminophen (Tylenol), Serum <10 (*)    All other components within normal limits  CBC WITH DIFFERENTIAL/PLATELET - Abnormal; Notable for the following components:   WBC 3.7 (*)    All other components within normal limits  SALICYLATE LEVEL  ETHANOL  LIPASE, BLOOD  CBC WITH DIFFERENTIAL/PLATELET  URINALYSIS, ROUTINE W REFLEX MICROSCOPIC  RAPID URINE DRUG SCREEN, HOSP PERFORMED  I-STAT BETA HCG BLOOD, ED ([facility], [facility], [facility] ONLY)    EKG None  Radiology No results found.  Procedures Procedures (including critical care time)  Medications Ordered in ED Medications  sodium chloride 0.9 % bolus 1,000 mL (has no administration in time range)  famotidine (PEPCID) IVPB 20 mg premix (has no administration in time range)  lidocaine (XYLOCAINE) 2 % viscous mouth solution 15 mL (has no administration in time range)  acetaminophen (TYLENOL) tablet 650 mg (has no administration in time range)     Initial Impression / Assessment and Plan / ED Course  I have reviewed the triage vital signs and the nursing notes.  Pertinent labs & imaging results that were available during my care of the patient were reviewed by me and considered in my medical decision making (see chart for details).    53 year old female with multiple medical complaints.  Appears to have active bullous pemphigoid lesions to left hip, and no signs of superimposed infection.  She has refused steroids as she is concerned for worsening of her symptoms at urgent care today.  She then had an acute onset left leg numbness and weakness and EMS  was called and has now progressed to bilateral lower extremities and she also endorses saddle area paresthesias.  Reflexes are intact to bilateral lower extremities and pulses intact.  No history of injury or trauma and patient is without midline back pain.  Chart review shows that patient had similar onset of lower extremity weakness in December 2018 at which time work-up did not reveal a clear cause, to be psychogenic.  Case discussed with Dr. Rodena Medin, we will proceed with broad work-up at this time including MRI lumbar and thoracic spine to evaluate for cauda equina syndrome.  Patient does not wish to have any steroids at this time. - Patient seen and evaluated with Dr. Rodena Medin who agrees with current plan of care. - CBC unremarkable Beta-hCG negative Ethanol negative Salicylate negative Lipase within normal limits - Patient reports epigastric burning sensation reassessment of the abdomen discussed with Dr. Rodena Medin, Pepcid 20 mg IV ordered.  IV team consult in place on reevaluation of the patient she is resting comfortably in no acute distress.  Reevaluation of the abdomen she has a soft abdomen without distention, endorses mild epigastric tenderness however states it improved from when she first reported to our nursing staff, no peritoneal signs on examination.  Viscous lidocaine ordered. - Care handoff given to Arthor Captain, PA-C at shift  change.  UDS, urinalysis and MRI studies pending at this time.  Plan of care is to await imaging studies and disposition.   Note: Portions of this report may have been transcribed using voice recognition software. Every effort was made to ensure accuracy; however, inadvertent computerized transcription errors may still be present. Final Clinical Impressions(s) / ED Diagnoses   Final diagnoses:  None    ED Discharge Orders    None       Elizabeth PalauMorelli, Burnell Hurta A, PA-C 01/06/19 1520    Wynetta FinesMessick, Peter C, MD 01/06/19 [phone]

## 2019-01-07 ENCOUNTER — Ambulatory Visit (INDEPENDENT_AMBULATORY_CARE_PROVIDER_SITE_OTHER): Payer: Self-pay | Admitting: Family Medicine

## 2019-01-07 DIAGNOSIS — L120 Bullous pemphigoid: Secondary | ICD-10-CM

## 2019-01-07 DIAGNOSIS — M1711 Unilateral primary osteoarthritis, right knee: Secondary | ICD-10-CM

## 2019-01-07 MED ORDER — TRAMADOL HCL 50 MG PO TABS: 50.0000 mg | ORAL_TABLET | Freq: Two times a day (BID) | ORAL | 0 refills | Status: DC | PRN

## 2019-01-07 MED FILL — traMADol HCL 50 MG TABS: 50 | 30 days supply | Qty: 60 | Fill #0

## 2019-01-07 NOTE — Telephone Encounter (Signed)
Spoke to her but has decided to not use anything stronger than her Tramadol

## 2019-01-07 NOTE — Telephone Encounter (Signed)
Please advise 

## 2019-01-07 NOTE — Progress Notes (Signed)
Patient ID: Sabrina [name], female   DOB: 1966-06-02, 53 y.o.   MRN: [id number]  This visit type was conducted due to national recommendations for restrictions regarding the COVID-19 pandemic in an effort to limit this patient's exposure and mitigate transmission in our community.   Virtual Visit via Video Note  I connected with Sabrina [name] on 01/07/19 at  9:45 AM EDT by a video enabled telemedicine application and verified that I am speaking with the correct person using two identifiers.  Location patient: home Location provider:work or home office Persons participating in the virtual visit: patient, provider  I discussed the limitations of evaluation and management by telemedicine and the availability of in person appointments. The patient expressed understanding and agreed to proceed.   HPI: Patient has history of bullous pemphigoid.  She has had some recent painful blisters with outbreak on several areas including her legs, back, side, and breast region.  She states she has always had these only on the left side.  She has had work-up and has seen previous dermatologist at [facility].  She actually had gone to the ER yesterday.  She had several labs that were mostly unremarkable.  Electrolytes were normal except for low CO2 level.  She states that she has somewhat painful blisters but these tend to rupture and she either uses Silvadene or topical steroid.  Her main concern is she is having some underlying muscle pain which she states she has had previously.  She took some Toradol yesterday without much improvement.  Use tramadol in the past which helps somewhat.  She has had bullous pemphigoid diagnosis since 2001.   ROS: See pertinent positives and negatives per HPI.  Past Medical History:  Diagnosis Date  . Arthritis    "knees" (09/06/2017)  . Bullous pemphigus   . Cat bite 06/2014   to left elbow  . History of blood transfusion 1988   "when I had my baby"  . Muscle  weakness of lower extremity 2001; 09/05/2017   "resolved after a couple weeks; ?" (09/06/2017)  . 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 Poisoning, snake bite 04/08/2016   "[place]; RUE"  . PONV (postoperative nausea and vomiting)   . Situational anxiety   . Staph infection ~ 2015   "left elbow and finger"    Past Surgical History:  Procedure Laterality Date  . APPENDECTOMY  ~ 1987  . APPLICATION OF A-CELL OF EXTREMITY Left 08/05/2015   Procedure: APPLICATION OF A-CELL OF EXTREMITY;  Surgeon: Peggye Form, DO;  Location: [facility];  Service: Plastics;  Laterality: Left;  . BREAST SURGERY Right 1990   "milk duct taken out"  . DEBRIDEMENT AND CLOSURE WOUND Left 07/01/2015   Procedure: LEFT ELBOW EXCISION OF WOUND WITH PRIMARY CLOSURE 2X5 CM ;  Surgeon: Peggye Form, DO;  Location: [facility];  Service: Plastics;  Laterality: Left;  . ELBOW SURGERY Left X 23 in [place] <06/2015   from a cat bite; all I&D  . I&D EXTREMITY Left 07/08/2015   Procedure: IRRIGATION AND DEBRIDEMENT EXTREMITY, DRAINAGE OF LEFT ARM WOUND, A-CELL PLACEMENT, WOUND VAC PLACEMENT;  Surgeon: Alena Bills Dillingham, DO;  Location: [facility];  Service: Plastics;  Laterality: Left;  . INCISION AND DRAINAGE OF WOUND Left 08/05/2015   Procedure: IRRIGATION AND DEBRIDEMENT LEFT ELBOW WOUND, PLACEMENT OF ACELL;  Surgeon: Peggye Form, DO;  Location: [facility];  Service: Plastics;  Laterality: Left;  . LAPAROSCOPIC CHOLECYSTECTOMY  1998  .  SKIN GRAFT Left 2016   took from anterior thigh; placed at elbow  . TONSILLECTOMY  ~ 2000  . TOTAL ABDOMINAL HYSTERECTOMY  2003  . WRIST SURGERY Right 01/2016    Family History  Problem Relation Age of Onset  . Liver disease Mother   . Dementia Mother   . Cirrhosis Mother   . Prostate cancer Father   . Colon cancer Maternal Grandmother   . Ovarian cancer Maternal Aunt     SOCIAL HX: non-smoker   Current Outpatient  Medications:  .  clonazePAM (KLONOPIN) 0.5 MG tablet, 1  tid (Patient taking differently: Take 1 mg by mouth 3 (three) times daily. ), Disp: 90 tablet, Rfl: 3 .  diclofenac sodium (VOLTAREN) 1 % GEL, Apply 4 g topically 4 (four) times daily. (Patient not taking: Reported on 01/06/2019), Disp: 4 Tube, Rfl: 0 .  gabapentin (NEURONTIN) 300 MG capsule, Take 2 capsules (600 mg total) by mouth 3 (three) times daily. Take 2 capsules 3 times a day. (Patient taking differently: Take 600 mg by mouth 3 (three) times daily. ), Disp: 120 capsule, Rfl: 1 .  PARoxetine (PAXIL) 10 MG tablet, 1  qhs  For 2 weeks  Then D/C (Patient not taking: Reported on 01/06/2019), Disp: 14 tablet, Rfl: 0 .  prochlorperazine (COMPAZINE) 10 MG tablet, Take 1 tablet (10 mg total) by mouth every 6 (six) hours as needed for nausea or vomiting. (Patient not taking: Reported on 01/06/2019), Disp: 30 tablet, Rfl: 2 .  topiramate (TOPAMAX) 25 MG tablet, Take 1 tablet (25 mg total) by mouth 2 (two) times daily. (Patient not taking: Reported on 01/06/2019), Disp: 60 tablet, Rfl: 1 .  traMADol (ULTRAM) 50 MG tablet, Take 1 tablet (50 mg total) by mouth every 12 (twelve) hours as needed., Disp: 60 tablet, Rfl: 0 .  venlafaxine XR (EFFEXOR XR) 37.5 MG 24 hr capsule, 1  qam (Patient taking differently: Take 37.5 mg by mouth daily with breakfast. ), Disp: 30 capsule, Rfl: 4 .  Vitamin D, Ergocalciferol, (DRISDOL) 1.25 MG (50000 UT) CAPS capsule, Take 50,000 Units by mouth every Wednesday. , Disp: , Rfl:   EXAM:  VITALS per patient if applicable:  GENERAL: alert, oriented, appears well and in no acute distress  HEENT: atraumatic, conjunttiva clear, no obvious abnormalities on inspection of external nose and ears  NECK: normal movements of the head and neck  LUNGS: on inspection no signs of respiratory distress, breathing rate appears normal, no obvious gross SOB, gasping or wheezing  CV: no obvious cyanosis  MS: moves all visible extremities  without noticeable abnormality  PSYCH/NEURO: pleasant and cooperative, no obvious depression or anxiety, speech and thought processing grossly intact  ASSESSMENT AND PLAN:  Discussed the following assessment and plan:  Bullous pemphigoid with current outbreak involving several areas.  She has some underlying pain which she states has been typical of outbreaks in the past.  Previous intolerance with oral steroids  -We agreed to 1 refill of tramadol #60 every 6 hours as needed for pain -Continue topical steroids -We discussed the fact that she may want to consult with local dermatologist and may need to have discussions regarding possible immunosuppressant therapy if she continues to have more frequent or severe flareups     I discussed the assessment and treatment plan with the patient. The patient was provided an opportunity to ask questions and all were answered. The patient agreed with the plan and demonstrated an understanding of the instructions.   The patient was  advised to call back or seek an in-person evaluation if the symptoms worsen or if the condition fails to improve as anticipated.   Evelena PeatBruce Ajanee Buren, MD

## 2019-01-09 MED FILL — GABAPENTIN 300 MG CAPSULE: 300 | 20 days supply | Qty: 120 | Fill #1

## 2019-01-11 ENCOUNTER — Other Ambulatory Visit: Payer: Self-pay

## 2019-01-11 ENCOUNTER — Ambulatory Visit (INDEPENDENT_AMBULATORY_CARE_PROVIDER_SITE_OTHER): Payer: Self-pay | Admitting: Internal Medicine

## 2019-01-11 ENCOUNTER — Encounter: Payer: Self-pay | Admitting: Internal Medicine

## 2019-01-11 ENCOUNTER — Other Ambulatory Visit (HOSPITAL_COMMUNITY): Payer: Self-pay

## 2019-01-11 DIAGNOSIS — L120 Bullous pemphigoid: Secondary | ICD-10-CM

## 2019-01-11 MED ORDER — CLOBETASOL PROPIONATE 0.05 % EX CREA: 1.0000 "application " | TOPICAL_CREAM | Freq: Two times a day (BID) | CUTANEOUS | 3 refills | Status: DC

## 2019-01-11 MED ORDER — CLONAZEPAM 1 MG PO TABS: ORAL_TABLET | ORAL | 0 refills | Status: DC

## 2019-01-11 NOTE — Progress Notes (Signed)
Virtual Visit via Video Note  I connected with Sabrina [name] on 01/11/19 at 10:30 AM EDT by a video enabled telemedicine application and verified that I am speaking with the correct person using two identifiers.  Location patient: home Location provider: work office Persons participating in the virtual visit: patient, provider  I discussed the limitations of evaluation and management by telemedicine and the availability of in person appointments. The patient expressed understanding and agreed to proceed.   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 She is under a lot of stress due to the COVID-19 pandemic and also she just received notification that her father is ill in the hospital with a stroke in [place], received TPA and she is unable to visit due to coronavirus restrictions. Stress flares up her bullous pemphigoid.  She was given voltaren gel and tramadol recently by other providers in the office which she has been using without much effect. She wonders what else can be done. She agrees to see a Ship brokerlocal dermatologist.  Picture of current lesion on her left breast below:       ROS: Constitutional: Denies fever, chills, diaphoresis, appetite change and fatigue.  HEENT: Denies photophobia, eye pain, redness, hearing loss, ear pain, congestion, sore throat, rhinorrhea, sneezing, mouth sores, trouble swallowing, neck pain, neck stiffness and tinnitus.   Respiratory: Denies SOB, DOE, cough, chest tightness,  and wheezing.   Cardiovascular: Denies chest pain, palpitations and leg swelling.  Gastrointestinal: Denies nausea, vomiting, abdominal pain, diarrhea, constipation, blood in stool and abdominal distention.  Genitourinary: Denies dysuria, urgency, frequency, hematuria, flank pain and difficulty urinating.  Endocrine: Denies: hot or cold intolerance, sweats, changes in hair or nails, polyuria, polydipsia. Musculoskeletal: Denies myalgias,  back pain, joint swelling, arthralgias and gait problem.  Skin: Denies pallor, rash and wound.  Neurological: Denies dizziness, seizures, syncope, weakness, light-headedness, numbness and headaches.  Hematological: Denies adenopathy. Easy bruising, personal or family bleeding history  Psychiatric/Behavioral: Denies suicidal ideation, mood changes, confusion, nervousness, sleep disturbance and agitation   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HCC)    Poisoning, snake bite 04/08/2016   "[place]; RUE"   PONV (postoperative nausea and vomiting)    Situational anxiety    Staph infection ~ 2015   "left elbow and finger"    Past Surgical History:  Procedure Laterality Date   APPENDECTOMY  ~ 1987   APPLICATION OF A-CELL OF EXTREMITY Left 08/05/2015   Procedure: APPLICATION OF A-CELL OF EXTREMITY;  Surgeon: Peggye Formlaire S Dillingham, DO;  Location: [facility];  Service: Plastics;  Laterality: Left;   BREAST SURGERY Right 1990   "milk duct taken out"   DEBRIDEMENT AND CLOSURE WOUND Left 07/01/2015   Procedure: LEFT ELBOW EXCISION OF WOUND WITH PRIMARY CLOSURE 2X5 CM ;  Surgeon: Peggye Formlaire S Dillingham, DO;  Location: [facility];  Service: Plastics;  Laterality: Left;   ELBOW SURGERY Left X 23 in [place] <06/2015   from a cat bite; all I&D   I&D EXTREMITY Left 07/08/2015   Procedure: IRRIGATION AND DEBRIDEMENT EXTREMITY, DRAINAGE OF LEFT ARM WOUND, A-CELL PLACEMENT, WOUND  VAC PLACEMENT;  Surgeon: Alena Bills Dillingham, DO;  Location: [facility];  Service: Plastics;  Laterality: Left;   INCISION AND DRAINAGE OF WOUND Left 08/05/2015   Procedure: IRRIGATION AND DEBRIDEMENT LEFT  ELBOW WOUND, PLACEMENT OF ACELL;  Surgeon: Peggye Form, DO;  Location: [facility];  Service: Plastics;  Laterality: Left;   LAPAROSCOPIC CHOLECYSTECTOMY  1998   SKIN GRAFT Left 2016   took from anterior thigh; placed at elbow   TONSILLECTOMY  ~ 2000   TOTAL ABDOMINAL HYSTERECTOMY  2003   WRIST SURGERY Right 01/2016    Family History  Problem Relation Age of Onset   Liver disease Mother    Dementia Mother    Cirrhosis Mother    Prostate cancer Father    Colon cancer Maternal Grandmother    Ovarian cancer Maternal Aunt     SOCIAL HX:   reports that she has never smoked. She has never used smokeless tobacco. She reports that she does not drink alcohol or use drugs.   Current Outpatient Medications:    clobetasol cream (TEMOVATE) 0.05 %, Apply 1 application topically 2 (two) times daily., Disp: 30 g, Rfl: 3   clonazePAM (KLONOPIN) 0.5 MG tablet, 1  tid (Patient taking differently: Take 1 mg by mouth 3 (three) times daily. ), Disp: 90 tablet, Rfl: 3   diclofenac sodium (VOLTAREN) 1 % GEL, Apply 4 g topically 4 (four) times daily. (Patient not taking: Reported on 01/06/2019), Disp: 4 Tube, Rfl: 0   gabapentin (NEURONTIN) 300 MG capsule, Take 2 capsules (600 mg total) by mouth 3 (three) times daily. Take 2 capsules 3 times a day. (Patient taking differently: Take 600 mg by mouth 3 (three) times daily. ), Disp: 120 capsule, Rfl: 1   PARoxetine (PAXIL) 10 MG tablet, 1  qhs  For 2 weeks  Then D/C (Patient not taking: Reported on 01/06/2019), Disp: 14 tablet, Rfl: 0   prochlorperazine (COMPAZINE) 10 MG tablet, Take 1 tablet (10 mg total) by mouth every 6 (six) hours as needed for nausea or vomiting. (Patient not taking: Reported on 01/06/2019), Disp: 30 tablet, Rfl: 2   topiramate (TOPAMAX) 25 MG tablet, Take 1 tablet (25 mg total) by mouth 2 (two) times daily. (Patient not taking: Reported on 01/06/2019), Disp: 60 tablet, Rfl: 1   traMADol (ULTRAM) 50 MG  tablet, Take 1 tablet (50 mg total) by mouth every 12 (twelve) hours as needed., Disp: 60 tablet, Rfl: 0   venlafaxine XR (EFFEXOR XR) 37.5 MG 24 hr capsule, 1  qam (Patient taking differently: Take 37.5 mg by mouth daily with breakfast. ), Disp: 30 capsule, Rfl: 4   Vitamin D, Ergocalciferol, (DRISDOL) 1.25 MG (50000 UT) CAPS capsule, Take 50,000 Units by mouth every Wednesday. , Disp: , Rfl:   EXAM:   VITALS per patient if applicable: none reported  GENERAL: alert, oriented, appears well and in no acute distress  HEENT: atraumatic, conjunttiva clear, no obvious abnormalities on inspection of external nose and ears  NECK: normal movements of the head and neck  LUNGS: on inspection no signs of respiratory distress, breathing rate appears normal, no obvious gross increased work of breathing, gasping or wheezing  CV: no obvious cyanosis  MS: moves all visible extremities without noticeable abnormality  PSYCH/NEURO: pleasant and cooperative, no obvious depression or anxiety, speech and thought processing grossly intact  ASSESSMENT AND PLAN:   Bullous pemphigoid  -Up to Date research suggests that clobetasol cream can be helpful. -There is also mention of  steroid-sparing agents, ie immunodepressants, like mycophenolate and azathioprine, however, I am loathe to use these without dermatology guidance. -Will send in Rx for clobetasol cream as well as dermatology referral.      I discussed the assessment and treatment plan with the patient. The patient was provided an opportunity to ask questions and all were answered. The patient agreed with the plan and demonstrated an understanding of the instructions.   The patient was advised to call back or seek an in-person evaluation if the symptoms worsen or if the condition fails to improve as anticipated.    Chaya Jan, MD  [facility]

## 2019-01-11 NOTE — Progress Notes (Signed)
Patient called with severe anxiety, her father just had a stroke and he is in [place]. Patient is unable to go to him and this is causing distress. Dr. Donell Beers was consulted and has ordered a new prescription for her Klonopin. I called the new order into the pharmacy and called patient to let her know.

## 2019-01-12 MED FILL — VENLAFAXINE HCL ER 37.5 MG: 37.5 | 30 days supply | Qty: 30 | Fill #1

## 2019-01-13 ENCOUNTER — Encounter (HOSPITAL_COMMUNITY): Payer: Self-pay | Admitting: Emergency Medicine

## 2019-01-13 ENCOUNTER — Ambulatory Visit (HOSPITAL_COMMUNITY)
Admission: EM | Admit: 2019-01-13 | Discharge: 2019-01-13 | Disposition: A | Discharge status: Home / Self Care | Payer: Self-pay | Attending: Family Medicine | Admitting: Family Medicine

## 2019-01-13 ENCOUNTER — Other Ambulatory Visit: Payer: Self-pay

## 2019-01-13 DIAGNOSIS — L120 Bullous pemphigoid: Secondary | ICD-10-CM

## 2019-01-13 MED ORDER — HYDROCODONE-ACETAMINOPHEN 5-325 MG PO TABS: 1.0000 | ORAL_TABLET | Freq: Four times a day (QID) | ORAL | 0 refills | Status: DC | PRN

## 2019-01-13 NOTE — ED Provider Notes (Signed)
[facility]    CSN: [id number] Arrival date & time: 01/13/19  1208     History   Chief Complaint Chief Complaint  Patient presents with  . Rash    HPI Sabrina [name] is a 53 y.o. female history of bullous pemphigoid, anxiety, presenting today for evaluation of bullous pemphigoid flare.  Patient states that she has had an outbreak over the past 1.5 months.  Notes that since 2001 after brown recluse bite she has developed off and on outbreaks of bullous pemphigoid.  She has been seen by dermatology and [facility] who officially diagnosed her with this.  She notes that she has gone years without outbreaks, but of recently she notes her stress has been increased which typically triggers outbreaks of her rash.  She notes that since the COVID pandemic she has had increased anxiety, her father recently had a stroke and is in the ICU, and she recently lost her job and has had financial stressors.  She has been seen multiple times over the past week.  She notes that her main complaint is the pain.  She states that the blisters caused burning, but she also has a deeper muscle pain around these areas.  She is previously tried Voltaren cream, Toradol injections without relief.  She has been prescribed clobetasol ointment by her PCP which caused symptoms to burn and worsen.  Her PCP has initiated a dermatology referral.  She also is already on gabapentin and taking tramadol.  She also notes that she recently was initiated on Klonopin for her anxiety, but has not noted this to help her anxiety.  Tramadol has not been helping pain.  She would like to defer any steroids as she notes that steroids helped her symptoms once, but subsequently she has had 2 trials of steroids that have worsened her blisters/symptoms.  She would like to defer oral steroids unless it is an emergent situation.  Denies any purulent drainage from wounds.  Lesions usually occur on the left side.  HPI  Past Medical History:   Diagnosis Date  . Arthritis    "knees" (09/06/2017)  . Bullous pemphigus   . Cat bite 06/2014   to left elbow  . History of blood transfusion 1988   "when I had my baby"  . Muscle weakness of lower extremity 2001; 09/05/2017   "resolved after a couple weeks; ?" (09/06/2017)  . 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 Poisoning, snake bite 04/08/2016   "[place]; RUE"  . PONV (postoperative nausea and vomiting)   . Situational anxiety   . Staph infection ~ 2015   "left elbow and finger"    Patient Active Problem List   Diagnosis Date Noted  . Osteoarthritis of right knee 12/13/2018  . Panic disorder 12/13/2018  . Chronic headaches 12/13/2018  . Weakness 09/06/2017  . Depression 09/06/2017  . 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  . Generalized anxiety disorder with panic attacks 09/06/2017  . Right hand pain   . Wrist swelling   . Cellulitis of right upper extremity 04/11/2016  . Elbow pain 07/20/2015  . Anxiety 07/20/2015  . Tachycardia 07/04/2015  . 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HCC)   . Skin ulcer of upper arm, limited to breakdown of skin (HCC) 07/01/2015    Past Surgical History:  Procedure Laterality Date  . APPENDECTOMY  ~ 1987  . APPLICATION OF A-CELL OF EXTREMITY Left 08/05/2015   Procedure: APPLICATION OF A-CELL OF EXTREMITY;  Surgeon: Peggye Formlaire S Dillingham, DO;  Location: [facility];  Service: Government social research officer;  Laterality: Left;  . BREAST SURGERY Right 1990   "milk duct taken out"  . DEBRIDEMENT AND CLOSURE WOUND Left 07/01/2015   Procedure: LEFT ELBOW EXCISION OF WOUND WITH PRIMARY CLOSURE 2X5 CM ;  Surgeon: Peggye Form, DO;  Location: [facility];  Service: Plastics;  Laterality: Left;  . ELBOW SURGERY Left X 23 in [place] <06/2015   from a cat bite; all I&D  . I&D EXTREMITY Left 07/08/2015   Procedure: IRRIGATION AND DEBRIDEMENT EXTREMITY, DRAINAGE OF LEFT ARM WOUND, A-CELL PLACEMENT, WOUND VAC PLACEMENT;  Surgeon: Alena Bills Dillingham, DO;   Location: [facility];  Service: Plastics;  Laterality: Left;  . INCISION AND DRAINAGE OF WOUND Left 08/05/2015   Procedure: IRRIGATION AND DEBRIDEMENT LEFT ELBOW WOUND, PLACEMENT OF ACELL;  Surgeon: Peggye Form, DO;  Location: [facility];  Service: Plastics;  Laterality: Left;  . LAPAROSCOPIC CHOLECYSTECTOMY  1998  . SKIN GRAFT Left 2016   took from anterior thigh; placed at elbow  . TONSILLECTOMY  ~ 2000  . TOTAL ABDOMINAL HYSTERECTOMY  2003  . WRIST SURGERY Right 01/2016    OB History   No obstetric history on file.      Home Medications    Prior to Admission medications   Medication Sig Start Date End Date Taking? Authorizing Provider  clobetasol cream (TEMOVATE) 0.05 % Apply 1 application topically 2 (two) times daily. 01/11/19   Philip Aspen, Limmie Patricia, MD  clonazePAM (KLONOPIN) 1 MG tablet Take 1 tablet (1 mg) by mouth qam, 1/2 tablet (0.5 mg) midday and 1 po (1 mg) qhs. You may take one extra during the day for severe panic attack. 01/11/19   Plovsky, Earvin Hansen, MD  diclofenac sodium (VOLTAREN) 1 % GEL Apply 4 g topically 4 (four) times daily. 01/05/19   [place], Betty G, MD  gabapentin (NEURONTIN) 300 MG capsule Take 2 capsules (600 mg total) by mouth 3 (three) times daily. Take 2 capsules 3 times a day. Patient taking differently: Take 600 mg by mouth 3 (three) times daily.  12/24/18   Philip Aspen, Limmie Patricia, MD  HYDROcodone-acetaminophen (NORCO/VICODIN) 5-325 MG tablet Take 1 tablet by mouth every 6 (six) hours as needed for severe pain. 01/13/19   Jaselyn Nahm C, PA-C  prochlorperazine (COMPAZINE) 10 MG tablet Take 1 tablet (10 mg total) by mouth every 6 (six) hours as needed for nausea or vomiting. Patient not taking: Reported on 01/06/2019 09/21/18   [name], FNP  traMADol (ULTRAM) 50 MG tablet Take 1 tablet (50 mg total) by mouth every 12 (twelve) hours as needed. 01/07/19   Burchette, Elberta Fortis, MD  venlafaxine XR (EFFEXOR XR) 37.5 MG 24 hr capsule 1   qam Patient taking differently: Take 37.5 mg by mouth daily with breakfast.  12/12/18   Archer Asa, MD  Vitamin D, Ergocalciferol, (DRISDOL) 1.25 MG (50000 UT) CAPS capsule Take 50,000 Units by mouth every Wednesday.     [provider]    Family History Family History  Problem Relation Age of Onset  . Liver disease Mother   . Dementia Mother   . Cirrhosis Mother   . Prostate cancer Father   . Colon cancer Maternal Grandmother   . Ovarian cancer Maternal Aunt     Social History Social History   Tobacco Use  . Smoking status: Never Smoker  . Smokeless tobacco: Never Used  Substance Use Topics  . Alcohol use: No    Alcohol/week: 0.0 standard drinks  Comment: 09/06/2017 "I'll have a glass of wine a couple times/year; maybe"  . Drug use: No     Allergies   Zofran [ondansetron hcl]; Droperidol; Flexeril [cyclobenzaprine]; Morphine and related; Tessalon [benzonatate]; and Voltaren [diclofenac sodium]   Review of Systems Review of Systems  Constitutional: Negative for fatigue and fever.  Eyes: Negative for visual disturbance.  Respiratory: Negative for shortness of breath.   Cardiovascular: Negative for chest pain.  Gastrointestinal: Negative for abdominal pain, nausea and vomiting.  Musculoskeletal: Negative for arthralgias and joint swelling.  Skin: Positive for color change, rash and wound.  Neurological: Negative for dizziness, weakness, light-headedness and headaches.     Physical Exam Triage Vital Signs ED Triage Vitals  Enc Vitals Group     BP 01/13/19 1223 112/81     Pulse Rate 01/13/19 1223 98     Resp 01/13/19 1223 16     Temp 01/13/19 1223 98.5 F (36.9 C)     Temp Source 01/13/19 1223 Oral     SpO2 01/13/19 1223 97 %     Weight --      Height --      Head Circumference --      Peak Flow --      Pain Score 01/13/19 1219 9     Pain Loc --      Pain Edu? --      Excl. in GC? --    No data found.  Updated Vital Signs BP 112/81 (BP  Location: Right Arm)   Pulse 98   Temp 98.5 F (36.9 C) (Oral)   Resp 16   SpO2 97%   Visual Acuity Right Eye Distance:   Left Eye Distance:   Bilateral Distance:    Right Eye Near:   Left Eye Near:    Bilateral Near:     Physical Exam Vitals signs and nursing note reviewed.  Constitutional:      Appearance: She is well-developed.     Comments: No acute distress  HENT:     Head: Normocephalic and atraumatic.     Nose: Nose normal.  Eyes:     Conjunctiva/sclera: Conjunctivae normal.  Neck:     Musculoskeletal: Neck supple.  Cardiovascular:     Rate and Rhythm: Normal rate.  Pulmonary:     Effort: Pulmonary effort is normal. No respiratory distress.  Abdominal:     General: There is no distension.  Musculoskeletal: Normal range of motion.  Skin:    General: Skin is warm and dry.     Comments: See image below, multiple areas of hypopigmentation of previous scarring, multiple elliptical lesions with mild surrounding erythema and slightly raised to back, chest and trunk on left side, multiple more opened wounds with more intense erythema surrounding directly around the wound edges to back and left breast, left groin.  Neurological:     Mental Status: She is alert and oriented to person, place, and time.          UC Treatments / Results  Labs (all labs ordered are listed, but only abnormal results are displayed) Labs Reviewed - No data to display  EKG None  Radiology No results found.  Procedures Procedures (including critical care time)  Medications Ordered in UC Medications - No data to display  Initial Impression / Assessment and Plan / UC Course  I have reviewed the triage vital signs and the nursing notes.  Pertinent labs & imaging results that were available during my care of the patient were reviewed  by me and considered in my medical decision making (see chart for details).     Patient with longstanding history of bullous pemphigoid, flare  very similar to flares in the past.  Pain main complaint is not being controlled with NSAIDs, tramadol.  Would like to defer oral steroids.  Feels topical steroids have caused worsening burning.  States that previously she was on an immunosuppressant which did control her symptoms, but she no longer takes this.  She is unsure of which it is.  Discussed concern of increasing pain medicine given she is on tramadol and Klonopin.  Patient states she is willing to stop the Klonopin as it has not helped her anxiety and to stop the tramadol.  Discussed importance of not mixing these medicines with providing her hydrocodone to avoid any further respiratory depression.  Discussed increased risk of addiction with this medicine; but feel this is the next appropriate step in managing her pain.  Stressed importance of following up with primary care for update on status of dermatology referral.  She likely will need further medical management with immunosuppressants or glucose steroid sparing agents for management of her bullous pemphigoid.Discussed strict return precautions. Patient verbalized understanding and is agreeable with plan.  Final Clinical Impressions(s) / UC Diagnoses   Final diagnoses:  Bullous pemphigoid     Discharge Instructions     I have provided to you with hydrocodone.  Please stop the Klonopin and tramadol.  Do not use with these medicines as it will increase drowsiness and risk of you stopping breathing.  Please contact your primary care on Monday to follow-up on status of dermatology referral  Please follow-up here in the meantime if symptoms changing or worsening, developing increased redness or drainage from wounds    ED Prescriptions    Medication Sig Dispense Auth. Provider   HYDROcodone-acetaminophen (NORCO/VICODIN) 5-325 MG tablet Take 1 tablet by mouth every 6 (six) hours as needed for severe pain. 15 tablet Azani Brogdon, Baldwin C, PA-C     Controlled Substance Prescriptions [place]  Controlled Substance Registry consulted? Yes, I have consulted the [place] Controlled Substances Registry for this patient, and feel the risk/benefit ratio today is favorable for proceeding with this prescription for a controlled substance.  Has had recent tramadol and Klonopin prescription.  Discussed importance of not mixing these medicines moving forward and not taking tramadol or Klonopin with the hydrocodone prescribed.     Lew Dawes, PA-C 01/13/19 1316

## 2019-01-13 NOTE — ED Triage Notes (Signed)
Patient complains of left torso rash.  Onset 1 1/2 months ago   Described as burning and hurting

## 2019-01-13 NOTE — Discharge Instructions (Signed)
I have provided to you with hydrocodone.  Please stop the Klonopin and tramadol.  Do not use with these medicines as it will increase drowsiness and risk of you stopping breathing.  Please contact your primary care on Monday to follow-up on status of dermatology referral  Please follow-up here in the meantime if symptoms changing or worsening, developing increased redness or drainage from wounds

## 2019-01-14 ENCOUNTER — Encounter: Payer: Self-pay | Admitting: Internal Medicine

## 2019-01-14 ENCOUNTER — Telehealth: Payer: Self-pay | Admitting: Orthopaedic Surgery

## 2019-01-14 ENCOUNTER — Telehealth: Payer: Self-pay | Admitting: *Deleted

## 2019-01-14 MED FILL — DOXYCYCLINE HYCLATE 100 MG: 100 | 30 days supply | Qty: 60 | Fill #0

## 2019-01-14 MED FILL — predniSONE 20 MG TABS: 20 | 21 days supply | Qty: 14 | Fill #0

## 2019-01-14 NOTE — Telephone Encounter (Signed)
Patient calling to see if doctor can do anything to help her knee pain. 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 Her doctor for that gave her a rx for Tramadol that did not help. [place] gave her Hydrocodone yesterday which helps blisters, but not knee pain. Please call to advise.

## 2019-01-14 NOTE — Telephone Encounter (Signed)
Copied from CRM [id number]. Topic: General - Inquiry >> Jan 14, 2019 11:07 AM Gwenlyn Fudge A wrote: Reason for CRM: Pt states she has not be able to get in to see dermatologist due to COVID. She is requesting advice because her condition is getting worse and she is in a lot of pain. Pt is requesting to talk to nurse about best options. She is also requesting to know if PCP has been in contact with PCP. Please advise.

## 2019-01-14 NOTE — Telephone Encounter (Signed)
I sent her a mychart message and a Rx for a steroid cream as well as a dermatology referral. Not much else I can do without derm input.

## 2019-01-14 NOTE — Telephone Encounter (Signed)
I called patient, per [name] patient needs to contact PCP for pain meds.

## 2019-01-15 ENCOUNTER — Encounter (HOSPITAL_COMMUNITY): Payer: Self-pay | Admitting: Emergency Medicine

## 2019-01-15 ENCOUNTER — Other Ambulatory Visit: Payer: Self-pay

## 2019-01-15 ENCOUNTER — Other Ambulatory Visit: Payer: Self-pay | Admitting: Internal Medicine

## 2019-01-15 ENCOUNTER — Emergency Department (HOSPITAL_COMMUNITY)
Admission: EM | Admit: 2019-01-15 | Discharge: 2019-01-16 | Disposition: A | Discharge status: Home / Self Care | Payer: Self-pay | Attending: Emergency Medicine | Admitting: Emergency Medicine

## 2019-01-15 DIAGNOSIS — L120 Bullous pemphigoid: Secondary | ICD-10-CM | POA: Insufficient documentation

## 2019-01-15 DIAGNOSIS — R51 Headache: Principal | ICD-10-CM

## 2019-01-15 DIAGNOSIS — G8929 Other chronic pain: Secondary | ICD-10-CM

## 2019-01-15 DIAGNOSIS — R070 Pain in throat: Secondary | ICD-10-CM

## 2019-01-15 DIAGNOSIS — Z79899 Other long term (current) drug therapy: Secondary | ICD-10-CM | POA: Insufficient documentation

## 2019-01-15 DIAGNOSIS — R1110 Vomiting, unspecified: Secondary | ICD-10-CM | POA: Insufficient documentation

## 2019-01-15 LAB — COMPREHENSIVE METABOLIC PANEL
ALT: 15 U/L (ref 0–44)
AST: 18 U/L (ref 15–41)
Albumin: 4.3 g/dL (ref 3.5–5.0)
Alkaline Phosphatase: 73 U/L (ref 38–126)
Anion gap: 6 (ref 5–15)
BUN: 25 mg/dL — ABNORMAL HIGH (ref 6–20)
CO2: 21 mmol/L — ABNORMAL LOW (ref 22–32)
Calcium: 8.9 mg/dL (ref 8.9–10.3)
Chloride: 112 mmol/L — ABNORMAL HIGH (ref 98–111)
Creatinine, Ser: 1.04 mg/dL — ABNORMAL HIGH (ref 0.44–1.00)
GFR calc Af Amer: >60 mL/min (ref >60)
GFR calc non Af Amer: >60 mL/min (ref >60)
Glucose, Bld: 99 mg/dL (ref 70–99)
Potassium: 3.5 mmol/L (ref 3.5–5.1)
Sodium: 139 mmol/L (ref 135–145)
Total Bilirubin: 0.6 mg/dL (ref 0.3–1.2)
Total Protein: 7.2 g/dL (ref 6.5–8.1)

## 2019-01-15 LAB — I-STAT BETA HCG BLOOD, ED (MC, WL, AP ONLY): I-stat hCG, quantitative: <5 m[IU]/mL (ref <5)

## 2019-01-15 LAB — URINALYSIS, ROUTINE W REFLEX MICROSCOPIC
Bacteria, UA: NONE SEEN
Bilirubin Urine: NEGATIVE
Glucose, UA: NEGATIVE mg/dL
Hgb urine dipstick: NEGATIVE
Ketones, ur: 20 mg/dL — AB
Nitrite: NEGATIVE
Protein, ur: NEGATIVE mg/dL
Specific Gravity, Urine: 1.02 (ref 1.005–1.030)
pH: 6 (ref 5.0–8.0)

## 2019-01-15 LAB — CBC
HCT: 36.3 % (ref 36.0–46.0)
Hemoglobin: 12.2 g/dL (ref 12.0–15.0)
MCH: 31.9 pg (ref 26.0–34.0)
MCHC: 33.6 g/dL (ref 30.0–36.0)
MCV: 94.8 fL (ref 80.0–100.0)
Platelets: 204 10*3/uL (ref 150–400)
RBC: 3.83 MIL/uL — ABNORMAL LOW (ref 3.87–5.11)
RDW: 12.7 % (ref 11.5–15.5)
WBC: 3.3 10*3/uL — ABNORMAL LOW (ref 4.0–10.5)
nRBC: 0 % (ref 0.0–0.2)

## 2019-01-15 LAB — LIPASE, BLOOD: Lipase: 33 U/L (ref 11–51)

## 2019-01-15 MED ORDER — BUTALBITAL-APAP-CAFFEINE 50-325-40 MG PO TABS: 1.0000 | ORAL_TABLET | Freq: Four times a day (QID) | ORAL | 0 refills | Status: DC | PRN

## 2019-01-15 MED ORDER — SODIUM CHLORIDE 0.9% FLUSH
3.0000 mL | Freq: Once | INTRAVENOUS | Status: AC
  Administered 2019-01-16: 3 mL via INTRAVENOUS

## 2019-01-15 MED ORDER — METHYLPREDNISOLONE SODIUM SUCC 125 MG IJ SOLR
80.0000 mg | Freq: Once | INTRAMUSCULAR | Status: AC
  Administered 2019-01-16: 80 mg via INTRAVENOUS
  Filled 2019-01-15: qty 2

## 2019-01-15 MED ORDER — FENTANYL CITRATE (PF) 100 MCG/2ML IJ SOLN
50.0000 ug | Freq: Once | INTRAMUSCULAR | Status: AC
  Administered 2019-01-16: 50 ug via INTRAVENOUS
  Filled 2019-01-15: qty 2

## 2019-01-15 MED ORDER — SODIUM CHLORIDE 0.9 % IV BOLUS
1000.0000 mL | Freq: Once | INTRAVENOUS | Status: AC
  Administered 2019-01-16: 1000 mL via INTRAVENOUS

## 2019-01-15 NOTE — ED Provider Notes (Signed)
[facility]-EMERGENCY DEPT Provider Note   CSN: [id number] Arrival date & time: 01/15/19  2113    History   Chief Complaint Chief Complaint  Patient presents with   Emesis   Blister    on left side    HPI Sabrina [name] is a 53 y.o. female with PMH/o Arthritis, Bullous Pemphigus, Anxiety who presents for evaluation of persistent and progressively worsening blisters as well as difficulty swallowing.  Patient reports a history of bullous going and states that about a month ago, she started developing scattered blisters to her left side consistent with a flare.  Patient states that they worsen whenever she is stressed or anxious and reports recent stressors with COVID-19 and financial situations.  Patient reports that she started seeing a new primary care doctor who referred her to a dermatologist because of COVID-19, has not been able to see them.  Patient states that over the last week or so, the blisters on her left side have gotten progressively worse.  Additionally, she states that for the last 2 weeks, she has had blisters in her mouth.  She states that she has had them in her mouth before but this has become more painful.  She states that for the last 5 days, she has had difficulty swallowing secondary to pain.  She states she has not been able to tolerate p.o. and has only been able to eat a small amount of soft yogurt.  She has not had any drooling.  Patient states that today, she tried to eats very small bites of chicken and carrots and states that she was unable to swallow.  She was seen at urgent care on 01/13/19 for evaluation of symptoms.  At that time, they gave her a prescription of Norco which she states she has been taking but it has not been helping her pain.  She called her primary care doctor who gave her an additional dose of Norco.  Additionally, they wrote her for a prescription of doxycycline and steroids which she states she has not been able to fill yet.   She comes in today because of worsening pain and difficulty swallowing.  She feels like the swelling is worse on the left side and feels like there is something on the left side of her throat.  She states she has not had any fever.  She has not any recent travel or recent known sick contacts.  She denies any known COVID-19 exposure.  Patient denies any chest pain, difficulty breathing, abdominal pain, numbness/weakness of arms or legs.     The history is provided by the patient.    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HCC)    Poisoning, snake bite 04/08/2016   "[place]; RUE"   PONV (postoperative nausea and vomiting)    Situational anxiety    Staph infection ~ 2015   "left elbow and finger"    Patient Active Problem List   Diagnosis Date Noted   Osteoarthritis of right knee 12/13/2018   Panic disorder 12/13/2018   Chronic headaches 12/13/2018   Weakness 09/06/2017   Depression 09/06/2017   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  Generalized anxiety disorder with panic attacks 09/06/2017   Right hand pain    Wrist swelling    Cellulitis of right upper extremity 04/11/2016   Elbow pain 07/20/2015   Anxiety 07/20/2015   Tachycardia 07/04/2015   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HCC)    Skin ulcer of upper arm, limited to breakdown of skin (HCC) 07/01/2015    Past Surgical History:  Procedure Laterality Date   APPENDECTOMY  ~ 1987   APPLICATION OF A-CELL OF EXTREMITY Left 08/05/2015   Procedure: APPLICATION OF A-CELL OF EXTREMITY;  Surgeon: Peggye Formlaire S Dillingham, DO;  Location: [facility];  Service: Plastics;  Laterality: Left;   BREAST SURGERY Right 1990   "milk duct  taken out"   DEBRIDEMENT AND CLOSURE WOUND Left 07/01/2015   Procedure: LEFT ELBOW EXCISION OF WOUND WITH PRIMARY CLOSURE 2X5 CM ;  Surgeon: Peggye Formlaire S Dillingham, DO;  Location: [facility];  Service: Plastics;  Laterality: Left;   ELBOW SURGERY Left X 23 in [place] <06/2015   from a cat bite; all I&D   I&D EXTREMITY Left 07/08/2015   Procedure: IRRIGATION AND DEBRIDEMENT EXTREMITY, DRAINAGE OF LEFT ARM WOUND, A-CELL PLACEMENT, WOUND VAC PLACEMENT;  Surgeon: Alena Billslaire S Dillingham, DO;  Location: [facility];  Service: Plastics;  Laterality: Left;   INCISION AND DRAINAGE OF WOUND Left 08/05/2015   Procedure: IRRIGATION AND DEBRIDEMENT LEFT ELBOW WOUND, PLACEMENT OF ACELL;  Surgeon: Peggye Formlaire S Dillingham, DO;  Location: [facility];  Service: Plastics;  Laterality: Left;   LAPAROSCOPIC CHOLECYSTECTOMY  1998   SKIN GRAFT Left 2016   took from anterior thigh; placed at elbow   TONSILLECTOMY  ~ 2000   TOTAL ABDOMINAL HYSTERECTOMY  2003   WRIST SURGERY Right 01/2016     OB History   No obstetric history on file.      Home Medications    Prior to Admission medications   Medication Sig Start Date End Date Taking? Authorizing Provider  acetaminophen (TYLENOL) 325 MG tablet Take 650 mg by mouth every 6 (six) hours as needed for moderate pain.   Yes [provider]  clonazePAM (KLONOPIN) 1 MG tablet Take 1 tablet (1 mg) by mouth qam, 1/2 tablet (0.5 mg) midday and 1 po (1 mg) qhs. You may take one extra during the day for severe panic attack. Patient taking differently: Take 0.5-1 mg by mouth See admin instructions. Take 1 tablet (1 mg) by mouth qam, 1/2 tablet (0.5 mg) midday and 1 po (1 mg) qhs. You may take one extra during the day for severe panic attack. 01/11/19  Yes Plovsky, Earvin HansenGerald, MD  diclofenac sodium (VOLTAREN) 1 % GEL Apply 4 g topically 4 (four) times daily. 01/05/19  Yes [place], Betty G, MD  gabapentin (NEURONTIN) 300 MG capsule Take 2 capsules (600  mg total) by mouth 3 (three) times daily. Take 2 capsules 3 times a day. Patient taking differently: Take 600 mg by mouth 3 (three) times daily.  12/24/18  Yes Philip AspenHernandez Acosta, Limmie PatriciaEstela Y, MD  HYDROcodone-acetaminophen (NORCO/VICODIN) 5-325 MG tablet Take 1 tablet by mouth every 6 (six) hours as needed for severe pain. Patient taking differently: Take 0.5-1 tablets by mouth every 6 (six) hours as needed for severe pain.  01/13/19  Yes Wieters, Hallie C, PA-C  venlafaxine XR (EFFEXOR XR) 37.5 MG 24 hr capsule 1  qam Patient taking differently: Take 37.5 mg by mouth daily with breakfast.  12/12/18  Yes Plovsky, Earvin HansenGerald, MD  Vitamin D, Ergocalciferol, (DRISDOL) 1.25  MG (50000 UT) CAPS capsule Take 50,000 Units by mouth every Wednesday.    Yes [provider]  butalbital-acetaminophen-caffeine (FIORICET) 50-325-40 MG tablet Take 1-2 tablets by mouth every 6 (six) hours as needed for headache. 01/15/19 01/15/20  Philip Aspen, Limmie Patricia, MD  clobetasol cream (TEMOVATE) 0.05 % Apply 1 application topically 2 (two) times daily. Patient not taking: Reported on 01/16/2019 01/11/19   Philip Aspen, Limmie Patricia, MD  lidocaine (XYLOCAINE) 2 % solution Use as directed 2 mLs in the mouth or throat as needed for mouth pain. 01/16/19   Maxwell Caul, PA-C  prochlorperazine (COMPAZINE) 10 MG tablet Take 1 tablet (10 mg total) by mouth every 6 (six) hours as needed for nausea or vomiting. Patient not taking: Reported on 01/06/2019 09/21/18   [name], FNP  traMADol (ULTRAM) 50 MG tablet Take 1 tablet (50 mg total) by mouth every 12 (twelve) hours as needed. Patient not taking: Reported on 01/16/2019 01/07/19   Kristian Covey, MD    Family History Family History  Problem Relation Age of Onset   Liver disease Mother    Dementia Mother    Cirrhosis Mother    Prostate cancer Father    Colon cancer Maternal Grandmother    Ovarian cancer Maternal Aunt     Social History Social History   Tobacco  Use   Smoking status: Never Smoker   Smokeless tobacco: Never Used  Substance Use Topics   Alcohol use: No    Alcohol/week: 0.0 standard drinks    Comment: 09/06/2017 "I'll have a glass of wine a couple times/year; maybe"   Drug use: No     Allergies   Zofran [ondansetron hcl]; Droperidol; Flexeril [cyclobenzaprine]; Morphine and related; Tessalon [benzonatate]; and Voltaren [diclofenac sodium]   Review of Systems Review of Systems  Constitutional: Negative for fever.  HENT: Positive for sore throat and trouble swallowing. Negative for drooling.   Respiratory: Negative for cough and shortness of breath.   Cardiovascular: Negative for chest pain.  Gastrointestinal: Negative for abdominal pain, nausea and vomiting.  Skin: Positive for rash and wound.  Neurological: Negative for weakness, numbness and headaches.  All other systems reviewed and are negative.    Physical Exam Updated Vital Signs BP 103/73 (BP Location: Left Arm)    Pulse 78    Temp 98.2 F (36.8 C) (Oral)    Resp 16    Ht  (1.626 m)    Wt 77.1 kg    SpO2 99%    BMI 29.18 kg/m   Physical Exam Vitals signs and nursing note reviewed.  Constitutional:      Appearance: Normal appearance. She is well-developed.  HENT:     Head: Normocephalic and atraumatic.     Mouth/Throat:     Lips: Lesions present.     Comments: Airway is patent, phonation is intact. Uvula is midline.  No trismus. Scattered oral lesions noted throughout the hard and soft palate as well as the buccal mucosa  Eyes:     General: Lids are normal.     Conjunctiva/sclera: Conjunctivae normal.     Pupils: Pupils are equal, round, and reactive to light.  Neck:     Musculoskeletal: Full passive range of motion without pain.     Comments: No edema, crepitus. No palpable mass. Neck is supple and without rigidity. No muffled phonation.  Cardiovascular:     Rate and Rhythm: Regular rhythm. Tachycardia present.     Pulses: Normal pulses.  Heart sounds: Normal heart sounds. No murmur. No friction rub. No gallop.   Pulmonary:     Effort: Pulmonary effort is normal.     Breath sounds: Normal breath sounds.     Comments: Lungs clear to auscultation bilaterally.  Symmetric chest rise.  No wheezing, rales, rhonchi. Abdominal:     Palpations: Abdomen is soft. Abdomen is not rigid.     Tenderness: There is no abdominal tenderness. There is no guarding.     Comments: Abdomen is soft, non-distended, non-tender. No rigidity, No guarding. No peritoneal signs.  Musculoskeletal: Normal range of motion.  Skin:    General: Skin is warm and dry.     Capillary Refill: Capillary refill takes less than 2 seconds.     Findings: Rash present.     Comments: Scattered bullae noted to left anterior chest, abdomen and leg. Scattered bullae noted to the back. (See photos). Negative Nikolsky sign.   Neurological:     Mental Status: She is alert and oriented to person, place, and time.  Psychiatric:        Speech: Speech normal.              ED Treatments / Results  Labs (all labs ordered are listed, but only abnormal results are displayed) Labs Reviewed  COMPREHENSIVE METABOLIC PANEL - Abnormal; Notable for the following components:      Result Value   Chloride 112 (*)    CO2 21 (*)    BUN 25 (*)    Creatinine, Ser 1.04 (*)    All other components within normal limits  CBC - Abnormal; Notable for the following components:   WBC 3.3 (*)    RBC 3.83 (*)    All other components within normal limits  URINALYSIS, ROUTINE W REFLEX MICROSCOPIC - Abnormal; Notable for the following components:   Ketones, ur 20 (*)    Leukocytes,Ua SMALL (*)    All other components within normal limits  LIPASE, BLOOD  I-STAT BETA HCG BLOOD, ED ([facility], [facility], [facility] ONLY)    EKG None  Radiology Ct Soft Tissue Neck W Contrast  Result Date: 01/16/2019 CLINICAL DATA:  53 y/o F; history of bullous pemphigus. Blisters in the mouth on the left side, vomiting,  sore throat. EXAM: CT NECK WITH CONTRAST TECHNIQUE: Multidetector CT imaging of the neck was performed using the standard protocol following the bolus administration of intravenous contrast. CONTRAST:  OMNIPAQUE IOHEXOL 300 MG/ML  SOLN COMPARISON:  08/30/2016 CT neck. FINDINGS: Pharynx and larynx: No exophytic mass or mucosal swelling/enhancement. Salivary glands: No inflammation, mass, or stone. Thyroid: Normal. Lymph nodes: None enlarged or abnormal density. Vascular: Negative. Limited intracranial: Negative. Visualized orbits: Bilateral intra-ocular lens replacement. Mastoids and visualized paranasal sinuses: Clear. Skeleton: No acute or aggressive process. Upper chest: Negative. Other: None. IMPRESSION: No mass, abscess, or inflammatory process identified. Unremarkable CT of the neck. Electronically Signed   By: Mitzi Hansen M.D.   On: 01/16/2019 02:00    Procedures Procedures (including critical care time)  Medications Ordered in ED Medications  sodium chloride flush (NS) 0.9 % injection 3 mL (3 mLs Intravenous Given 01/16/19 0034)  sodium chloride 0.9 % bolus 1,000 mL (0 mLs Intravenous Stopped 01/16/19 0219)  fentaNYL (SUBLIMAZE) injection 50 mcg (50 mcg Intravenous Given 01/16/19 0033)  methylPREDNISolone sodium succinate (SOLU-MEDROL) 125 mg/2 mL injection 80 mg (80 mg Intravenous Given 01/16/19 0030)  iohexol (OMNIPAQUE) 300 MG/ML solution 75 mL (100 mLs Intravenous Contrast Given 01/16/19 0124)  promethazine (PHENERGAN)  injection 25 mg (25 mg Intravenous Given 01/16/19 0111)  lidocaine (XYLOCAINE) 2 % viscous mouth solution 15 mL (15 mLs Mouth/Throat Given 01/16/19 0228)  ketorolac (TORADOL) 30 MG/ML injection 30 mg (30 mg Intravenous Given 01/16/19 0228)     Initial Impression / Assessment and Plan / ED Course  I have reviewed the triage vital signs and the nursing notes.  Pertinent labs & imaging results that were available during my care of the patient were reviewed by me and  considered in my medical decision making (see chart for details).        53 y.o. F with PMH/o bullous pemphigoid who presents for evaluation of blisters to mouth and left side. Additionally reports some difficulty swallowing x 5 days. Patient is afebrile, non-toxic appearing, sitting comfortably on examination table. Vital signs reviewed and stable.  Patient is talking without any difficulty. She is tolerating her secretions without any distress. On exam, patient has scattered bullae noted to the left side.  Consistent with bullous pemphigoid.  Exam not concerning for shingles, SJS, TENs.  Additionally, exam not concerning for peritonsillar abscess, Ludwig angina. Will check labs. Given patient's difficulty swallowing, will plan for CT soft tissue neck.   I-STAT beta negative.  CMP shows bicarb of 21.  BUN 25 creatinine 1.04.  Lipase unremarkable.  CBC shows white blood cell 3.3.  UA shows small leukocytes, small and ketones.  Otherwise unremarkable.  CT soft tissue neck shows no evidence of any acute abnormality. No evidence of abscess. Will plan to PO challenge patient.   Patient able to tolerate p.o.  Vital signs are stable.  We will plan to send home patient with small dose of viscous lidocaine to help with symptoms.  Instructed patient to discuss with primary care doctor regarding starting steroids.  Additionally, discussed with patient that if she continues have pain, she may need referral to GI for possible evaluation. At this time, patient exhibits no emergent life-threatening condition that require further evaluation in ED or admission. Patient had ample opportunity for questions and discussion. All patient's questions were answered with full understanding. Strict return precautions discussed. Patient expresses understanding and agreement to plan.   Portions of this note were generated with Scientist, clinical (histocompatibility and immunogenetics). Dictation errors may occur despite best attempts at proofreading.   Final  Clinical Impressions(s) / ED Diagnoses   Final diagnoses:  Bullous pemphigoid  Throat pain    ED Discharge Orders         Ordered    lidocaine (XYLOCAINE) 2 % solution  As needed     01/16/19 0308           Maxwell Caul, PA-C 01/16/19 0342    Nira Conn, MD 01/17/19 [phone]

## 2019-01-15 NOTE — ED Triage Notes (Signed)
Patient states she has a hx of Bullous pemphigus. Patient states she has blisters in her mouth and on her left side. Patient states she has been vomiting because she can not keep her medication due to the blisters in her mouth. Patient states her neck hurts when she moves her neck to the left. Patient states her throat is sore.

## 2019-01-16 ENCOUNTER — Emergency Department (HOSPITAL_COMMUNITY): Payer: Self-pay

## 2019-01-16 ENCOUNTER — Ambulatory Visit: Payer: Self-pay | Admitting: Internal Medicine

## 2019-01-16 ENCOUNTER — Other Ambulatory Visit: Payer: Self-pay | Admitting: *Deleted

## 2019-01-16 ENCOUNTER — Telehealth: Payer: Self-pay | Admitting: Internal Medicine

## 2019-01-16 MED ORDER — LIDOCAINE VISCOUS HCL 2 % MT SOLN: 2.0000 mL | OROMUCOSAL | 0 refills | Status: DC | PRN

## 2019-01-16 MED ORDER — MAGIC MOUTHWASH W/LIDOCAINE: 5.0000 mL | Freq: Four times a day (QID) | ORAL | 0 refills | Status: DC | PRN

## 2019-01-16 MED ORDER — KETOROLAC TROMETHAMINE 30 MG/ML IJ SOLN
30.0000 mg | Freq: Once | INTRAMUSCULAR | Status: AC
  Administered 2019-01-16: 02:00:00 30 mg via INTRAVENOUS
  Filled 2019-01-16: qty 1

## 2019-01-16 MED ORDER — LIDOCAINE VISCOUS HCL 2 % MT SOLN
15.0000 mL | Freq: Once | OROMUCOSAL | Status: AC
  Administered 2019-01-16: 02:00:00 15 mL via OROMUCOSAL
  Filled 2019-01-16: qty 15

## 2019-01-16 MED ORDER — SODIUM CHLORIDE (PF) 0.9 % IJ SOLN
INTRAMUSCULAR | Status: AC
  Filled 2019-01-16: qty 50

## 2019-01-16 MED ORDER — IOHEXOL 300 MG/ML  SOLN
75.0000 mL | Freq: Once | INTRAMUSCULAR | Status: AC | PRN
  Administered 2019-01-16: 01:00:00 100 mL via INTRAVENOUS

## 2019-01-16 MED ORDER — PROMETHAZINE HCL 25 MG/ML IJ SOLN
25.0000 mg | Freq: Once | INTRAMUSCULAR | Status: AC
  Administered 2019-01-16: 01:00:00 25 mg via INTRAVENOUS
  Filled 2019-01-16: qty 1

## 2019-01-16 NOTE — Discharge Instructions (Signed)
Fill the prescriptions by your primary care doctor. Discuss if they want you to take doxycycline.  You can use the viscous lidocaine before eating to help with pain.   If you continue to have pain and difficulty swallowing, you may need to follow up with the referred GI doctor.   Return to the Emergency Department for any worsening pain, fever, difficulty breathing, inability to swallow your secretions or any other worsening or concerning symptoms.

## 2019-01-16 NOTE — Telephone Encounter (Signed)
Did not need triaging.   Agent transferred to office for further disposition.

## 2019-01-16 NOTE — Telephone Encounter (Signed)
Spoke with pharmacist  

## 2019-01-16 NOTE — Telephone Encounter (Signed)
Copied from CRM [id number]. Topic: Quick Communication - Rx Refill/Question >> Jan 16, 2019  2:57 PM Wyonia Hough E wrote: Medication: magic mouthwash w/lidocaine SOLN -Pharmacy needs the specific ingredients and amount of ingredients before they can fill this  [id number] - [place], [place] - [address] AT [facility] [address] [phone] (Phone) [phone] (Fax)

## 2019-01-17 ENCOUNTER — Ambulatory Visit (INDEPENDENT_AMBULATORY_CARE_PROVIDER_SITE_OTHER): Payer: No Typology Code available for payment source | Admitting: Psychiatry

## 2019-01-17 ENCOUNTER — Other Ambulatory Visit (HOSPITAL_COMMUNITY): Payer: Self-pay

## 2019-01-17 ENCOUNTER — Other Ambulatory Visit: Payer: Self-pay

## 2019-01-17 ENCOUNTER — Ambulatory Visit (INDEPENDENT_AMBULATORY_CARE_PROVIDER_SITE_OTHER): Payer: Self-pay | Admitting: Internal Medicine

## 2019-01-17 DIAGNOSIS — L120 Bullous pemphigoid: Secondary | ICD-10-CM

## 2019-01-17 DIAGNOSIS — F410 Panic disorder [episodic paroxysmal anxiety] without agoraphobia: Secondary | ICD-10-CM

## 2019-01-17 MED ORDER — HYDROCODONE-ACETAMINOPHEN 5-325 MG PO TABS: 1.0000 | ORAL_TABLET | Freq: Four times a day (QID) | ORAL | 0 refills | Status: DC | PRN

## 2019-01-17 MED ORDER — CLORAZEPATE DIPOTASSIUM 15 MG PO TABS: ORAL_TABLET | ORAL | 2 refills | Status: DC

## 2019-01-17 MED ORDER — MAGIC MOUTHWASH W/LIDOCAINE: 5.0000 mL | Freq: Four times a day (QID) | ORAL | 0 refills | Status: DC | PRN

## 2019-01-17 MED ORDER — DIAZEPAM 10 MG PO TABS: ORAL_TABLET | ORAL | 0 refills | Status: DC

## 2019-01-17 MED FILL — DIAZEPAM 10 MG TABS: 10 | 39 days supply | Qty: 90 | Fill #0

## 2019-01-17 MED FILL — HYDROCODON-APAP 5-325: 5-325 | 8 days supply | Qty: 30 | Fill #0

## 2019-01-17 MED FILL — MAGIC MW (MAAL,LIDO,BEN): 6 days supply | Qty: 120 | Fill #0

## 2019-01-17 NOTE — Progress Notes (Signed)
BH MD/PA/NP OP Progress Note  01/17/2019 2:32 PM Sabrina [name]  MRN:  [id number]  Chief Complaint: Panic disorder  At this time the patient is overwhelmed with stress.  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  She is having multiple skin lesions and oral lesions as well.  She is on prednisone and she [place]  She has been told that much of these dermatological lesions are related to stress.  She is told me that they have asked me to adjust her anxiety medicine.  It is noted that she has been on Klonopin 1 mg 3 times daily for over a month with no apparent benefits.  Patient is not psychotic.  The patient does not have clinical depression.  Her panic disorder is actually much better controlled at this time with low-dose Effexor.  We started the Effexor as we started to build up the Klonopin.  The patient has significant stresses.  Most importantly I think is the fact that her father recently had a CVA, is presently in the hospital on a ventilator.  He is in [place] which is impossible for her to go to visit.  She was not even sure about the status of coronavirus for him.  But he clearly is very ill.  Patient also has a biopsy coming up on Tuesday for her breasts.  She also has knee surgery scheduled in June.  Her plan is to get rid of the pain from her knee which are then give her opportunity to get back to riding horses.  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  She is now applying for disability for her dermatological condition.  She is going to lose the apartment that she is been living in for 3 years because of finances.  She is going to move in with her cousin who apparently is very supportive.  The patient is sleeping poorly.  Her appetite is fair.  She is not suicidal.  She denies chest pain shortness of breath or any physical symptoms at this  time.   HPI:   Past Psychiatric History: See intake H&P for full details. Reviewed, with no updates at this time.   Past Medical History:  Past Medical History:  Diagnosis Date  . Arthritis    "knees" (09/06/2017)  . Bullous pemphigus   . Cat bite 06/2014   to left elbow  . History of blood transfusion 1988   "when I had my baby"  . Muscle weakness of lower extremity 2001; 09/05/2017   "resolved after a couple weeks; ?" (09/06/2017)  . 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 Poisoning, snake bite 04/08/2016   "[place]; RUE"  . PONV (postoperative nausea and vomiting)   . Situational anxiety   . Staph infection ~ 2015   "left elbow and finger"    Past Surgical History:  Procedure Laterality Date  . APPENDECTOMY  ~ 1987  . APPLICATION OF A-CELL OF EXTREMITY Left 08/05/2015   Procedure: APPLICATION OF A-CELL OF EXTREMITY;  Surgeon: Peggye Formlaire S Dillingham, DO;  Location: [facility];  Service: Plastics;  Laterality: Left;  . BREAST SURGERY Right 1990   "milk duct taken out"  . DEBRIDEMENT AND CLOSURE WOUND Left 07/01/2015   Procedure: LEFT ELBOW EXCISION OF WOUND WITH PRIMARY CLOSURE 2X5 CM ;  Surgeon: Peggye Formlaire S Dillingham, DO;  Location: [facility];  Service:  Plastics;  Laterality: Left;  . ELBOW SURGERY Left X 23 in [place] <06/2015   from a cat bite; all I&D  . I&D EXTREMITY Left 07/08/2015   Procedure: IRRIGATION AND DEBRIDEMENT EXTREMITY, DRAINAGE OF LEFT ARM WOUND, A-CELL PLACEMENT, WOUND VAC PLACEMENT;  Surgeon: Alena Bills Dillingham, DO;  Location: [facility];  Service: Plastics;  Laterality: Left;  . INCISION AND DRAINAGE OF WOUND Left 08/05/2015   Procedure: IRRIGATION AND DEBRIDEMENT LEFT ELBOW WOUND, PLACEMENT OF ACELL;  Surgeon: Peggye Form, DO;  Location: [facility];  Service: Plastics;  Laterality: Left;  . LAPAROSCOPIC CHOLECYSTECTOMY  1998  . SKIN GRAFT Left 2016   took from anterior thigh; placed at elbow  .  TONSILLECTOMY  ~ 2000  . TOTAL ABDOMINAL HYSTERECTOMY  2003  . WRIST SURGERY Right 01/2016    Family Psychiatric History: See intake H&P for full details. Reviewed, with no updates at this time.   Family History:  Family History  Problem Relation Age of Onset  . Liver disease Mother   . Dementia Mother   . Cirrhosis Mother   . Prostate cancer Father   . Colon cancer Maternal Grandmother   . Ovarian cancer Maternal Aunt     Social History:  Social History   Socioeconomic History  . Marital status: Divorced    Spouse name: Not on file  . Number of children: Not on file  . Years of education: Not on file  . Highest education level: Not on file  Occupational History  . Not on file  Social Needs  . Financial resource strain: Not on file  . Food insecurity:    Worry: Not on file    Inability: Not on file  . Transportation needs:    Medical: Not on file    Non-medical: Not on file  Tobacco Use  . Smoking status: Never Smoker  . Smokeless tobacco: Never Used  Substance and Sexual Activity  . Alcohol use: No    Alcohol/week: 0.0 standard drinks    Comment: 09/06/2017 "I'll have a glass of wine a couple times/year; maybe"  . Drug use: No  . Sexual activity: Not Currently  Lifestyle  . Physical activity:    Days per week: Not on file    Minutes per session: Not on file  . Stress: Not on file  Relationships  . Social connections:    Talks on phone: Not on file    Gets together: Not on file    Attends religious service: Not on file    Active member of club or organization: Not on file    Attends meetings of clubs or organizations: Not on file    Relationship status: Not on file  Other Topics Concern  . Not on file  Social History Narrative  . Not on file    Allergies:  Allergies  Allergen Reactions  . Zofran [Ondansetron Hcl] Hives and Other (See Comments)    Spots around iv site  . Droperidol Palpitations  . Flexeril [Cyclobenzaprine] Anxiety  . Morphine  And Related Itching  . Tessalon [Benzonatate] Other (See Comments)    Watery eyes  . Voltaren [Diclofenac Sodium] Rash    Metabolic Disorder Labs: Lab Results  Component Value Date   HGBA1C 5.2 06/18/2018   MPG  07/05/2015    QUESTIONABLE IDENTIFICATION / INCORRECTLY LABELED SPECIMEN   No results found for: PROLACTIN Lab Results  Component Value Date   CHOL 209 (H) 06/18/2018   TRIG 71 06/18/2018  HDL 73 06/18/2018   CHOLHDL 2.9 06/18/2018   VLDL 29 04/27/2015   LDLCALC 122 (H) 06/18/2018   LDLCALC 138 (H) 04/27/2015   Lab Results  Component Value Date   TSH 2.890 06/18/2018   TSH  07/05/2015    QUESTIONABLE IDENTIFICATION / INCORRECTLY LABELED SPECIMEN    Therapeutic Level Labs: No results found for: LITHIUM No results found for: VALPROATE No components found for:  CBMZ  Current Medications: Current Outpatient Medications  Medication Sig Dispense Refill  . acetaminophen (TYLENOL) 325 MG tablet Take 650 mg by mouth every 6 (six) hours as needed for moderate pain.    . butalbital-acetaminophen-caffeine (FIORICET) 50-325-40 MG tablet Take 1-2 tablets by mouth every 6 (six) hours as needed for headache. 30 tablet 0  . clobetasol cream (TEMOVATE) 0.05 % Apply 1 application topically 2 (two) times daily. (Patient not taking: Reported on 01/16/2019) 30 g 3  . clorazepate (TRANXENE) 15 MG tablet 1  Bid for 5 days then 1 qam  2  qhs 90 tablet 2  . diclofenac sodium (VOLTAREN) 1 % GEL Apply 4 g topically 4 (four) times daily. 4 Tube 0  . gabapentin (NEURONTIN) 300 MG capsule Take 2 capsules (600 mg total) by mouth 3 (three) times daily. Take 2 capsules 3 times a day. (Patient taking differently: Take 600 mg by mouth 3 (three) times daily. ) 120 capsule 1  . HYDROcodone-acetaminophen (NORCO/VICODIN) 5-325 MG tablet Take 1 tablet by mouth every 6 (six) hours as needed for severe pain. 30 tablet 0  . lidocaine (XYLOCAINE) 2 % solution Use as directed 2 mLs in the mouth or throat as  needed for mouth pain. 15 mL 0  . magic mouthwash w/lidocaine SOLN Take 5 mLs by mouth 4 (four) times daily as needed for mouth pain (swich and swallow). 120 mL 0  . prochlorperazine (COMPAZINE) 10 MG tablet Take 1 tablet (10 mg total) by mouth every 6 (six) hours as needed for nausea or vomiting. (Patient not taking: Reported on 01/06/2019) 30 tablet 2  . traMADol (ULTRAM) 50 MG tablet Take 1 tablet (50 mg total) by mouth every 12 (twelve) hours as needed. (Patient not taking: Reported on 01/16/2019) 60 tablet 0  . venlafaxine XR (EFFEXOR XR) 37.5 MG 24 hr capsule 1  qam (Patient taking differently: Take 37.5 mg by mouth daily with breakfast. ) 30 capsule 4  . Vitamin D, Ergocalciferol, (DRISDOL) 1.25 MG (50000 UT) CAPS capsule Take 50,000 Units by mouth every Wednesday.      No current facility-administered medications for this visit.      Musculoskeletal: Strength & Muscle Tone: within normal limits Gait & Station: normal Patient leans: N/A  Psychiatric Specialty Exam: ROS  There were no vitals taken for this visit.There is no height or weight on file to calculate BMI.  General Appearance: Casual and Well Groomed  Eye Contact:  Good  Speech:  Clear and Coherent  Volume:  Normal  Mood:  Euthymic  Affect:  Congruent  Thought Process:  Goal Directed and Descriptions of Associations: Intact  Orientation:  Full (Time, Place, and Person)  Thought Content: Logical   Suicidal Thoughts:  No  Homicidal Thoughts:  No  Memory:  Immediate;   Fair  Judgement:  Fair  Insight:  Fair  Psychomotor Activity:  Normal  Concentration:  Concentration: Good  Recall:  Good  Fund of Knowledge: Good  Language: Good  Akathisia:  Negative  Handed:  Right  AIMS (if indicated): not done  Assets:  Communication Skills Desire for Improvement Housing Transportation  ADL's:  Intact  Cognition: WNL  Sleep:  Fair   Screenings: PHQ2-9     Office Visit from 09/21/2018 in [facility]  Office Visit from 06/25/2018 in [facility] Office Visit from 06/18/2018 in [facility] Office Visit from 08/28/2017 in [facility] Office Visit from 05/22/2017 in [facility]  PHQ-2 Total Score  1  0  0  0  0       Assessment and Plan:   At this time it is apparent the patient is having significant anxiety most likely related to an adjustment disorder with an anxious mood state.  I think this is related to the serious illness of her father and the patient's physical illnesses herself.  At this time it is not clear completely that she has been taking Klonopin correctly.  She says she has been taking 1 mg 3 times a day but according to our records she therefore should have received 90 pills but only received 70.  Instructions were not 0.1 3 times daily.  Nonetheless she feels that she has not had any benefit without any antianxiety effect from her Klonopin.  Today we will go ahead and discontinue the Klonopin and begin her on Tranxene 7.5 mg twice daily for 5 days and then 1 in the morning and 2 at night.  This hopefully will help her sleep as well.  She will call me back in 3 weeks and if she is not less anxious I will go ahead and increase the Tranxene.  She will continue taking 37.5 mg of Effexor for panic disorder.  Today the patient was asked to call back her therapist here to get back into therapy.  I am not clear where it went.  Patient will return to see me in 6 weeks.  Patient is not suicidal.  She obviously is stressed out by her physical health, the fact that she has to move, the fact that her father is seriously ill in another state and that she is very uncomfortable with her dermatological condition.  In the past someone is given her Seroquel which made her gain weight.  I will avoid this agent.  I believe the patient has panic disorder, adjustment disorder with an anxious mood state as well as insomnia.   Status of  current problems: gradually improving  Labs Ordered: No orders of the defined types were placed in this encounter.   Labs Reviewed: n/a  Collateral Obtained/Records Reviewed: n/a  Plan:  Continue Lexapro 20 mg daily Increase Seroquel to 200 mg nightly Return to clinic in 3-4 months, transfer care to Dr. Liz Beach, MD 01/17/2019, 2:32 PM

## 2019-01-17 NOTE — Progress Notes (Signed)
Virtual Visit via Video Note  I connected with [name] on 01/17/19 at  9:30 AM EDT by a video enabled telemedicine application and verified that I am speaking with the correct person using two identifiers.  Location patient: home Location provider: work office Persons participating in the virtual visit: patient, provider  I discussed the limitations of evaluation and management by telemedicine and the availability of in person appointments. The patient expressed understanding and agreed to proceed.   HPI: This is an acute visit for continued issues with her bullous pemphigoid.  Since we last spoke she has been to urgent care and to the emergency department.  She was finally able to see a dermatologist in [place] who started her on prednisone and doxycycline.  She is scheduled to return next week.  She has developed pain in the left side of her throat that is causing difficulty swallowing.  In the ER a CT scan of the neck was without acute abnormalities.  It is thought that this is possibly a manifestation of bullous pemphigoid.  She has been able to eat some since.  She is under a large amount of stress which is noted to flare up bullous pemphigoid, she lost her job, will probably lose her apartment, she had to put her dog down, her dad is in the hospital on the ventilator due to pneumonia and she is unable to visit.  It was suggested by dermatology that she follow-up with her psychiatrist for up titration of her medications.  In urgent care she was given a short supply of Vicodin which she has completed, this does not take pain away all the way but did seem to dull it some, she was given an extra 10 tablets by dermatology on Monday which she has finished as well.   ROS: Constitutional: Denies fever, chills, diaphoresis, appetite change and fatigue.  HEENT: Denies photophobia, eye pain, redness, hearing loss, ear pain, congestion, sore throat, rhinorrhea, sneezing, mouth sores,  trouble swallowing, neck pain, neck stiffness and tinnitus.   Respiratory: Denies SOB, DOE, cough, chest tightness,  and wheezing.   Cardiovascular: Denies chest pain, palpitations and leg swelling.  Gastrointestinal: Denies nausea, vomiting, abdominal pain, diarrhea, constipation, blood in stool and abdominal distention.  Genitourinary: Denies dysuria, urgency, frequency, hematuria, flank pain and difficulty urinating.  Endocrine: Denies: hot or cold intolerance, sweats, changes in hair or nails, polyuria, polydipsia. Musculoskeletal: Denies myalgias, back pain, joint swelling, arthralgias and gait problem.   Neurological: Denies dizziness, seizures, syncope, weakness, light-headedness, numbness and headaches.  Hematological: Denies adenopathy. Easy bruising, personal or family bleeding history  Psychiatric/Behavioral: Denies suicidal ideation, mood changes, confusion, nervousness, sleep disturbance and agitation   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HCC)    Poisoning, snake bite 04/08/2016   "[place]; RUE"   PONV (postoperative nausea and vomiting)    Situational anxiety    Staph infection ~ 2015   "left elbow and finger"    Past Surgical History:  Procedure Laterality Date   APPENDECTOMY  ~ 1987   APPLICATION OF A-CELL OF EXTREMITY Left 08/05/2015   Procedure: APPLICATION OF A-CELL OF EXTREMITY;  Surgeon:  Alena Bills Dillingham, DO;  Location: [facility];  Service: Plastics;  Laterality: Left;   BREAST SURGERY Right 1990   "milk duct taken out"   DEBRIDEMENT AND CLOSURE WOUND Left 07/01/2015   Procedure: LEFT ELBOW EXCISION OF WOUND WITH PRIMARY CLOSURE 2X5 CM ;   Surgeon: Peggye Form, DO;  Location: [facility];  Service: Plastics;  Laterality: Left;   ELBOW SURGERY Left X 23 in [place] <06/2015   from a cat bite; all I&D   I&D EXTREMITY Left 07/08/2015   Procedure: IRRIGATION AND DEBRIDEMENT EXTREMITY, DRAINAGE OF LEFT ARM WOUND, A-CELL PLACEMENT, WOUND VAC PLACEMENT;  Surgeon: Alena Bills Dillingham, DO;  Location: [facility];  Service: Plastics;  Laterality: Left;   INCISION AND DRAINAGE OF WOUND Left 08/05/2015   Procedure: IRRIGATION AND DEBRIDEMENT LEFT ELBOW WOUND, PLACEMENT OF ACELL;  Surgeon: Peggye Form, DO;  Location: [facility];  Service: Plastics;  Laterality: Left;   LAPAROSCOPIC CHOLECYSTECTOMY  1998   SKIN GRAFT Left 2016   took from anterior thigh; placed at elbow   TONSILLECTOMY  ~ 2000   TOTAL ABDOMINAL HYSTERECTOMY  2003   WRIST SURGERY Right 01/2016    Family History  Problem Relation Age of Onset   Liver disease Mother    Dementia Mother    Cirrhosis Mother    Prostate cancer Father    Colon cancer Maternal Grandmother    Ovarian cancer Maternal Aunt     SOCIAL HX:   reports that she has never smoked. She has never used smokeless tobacco. She reports that she does not drink alcohol or use drugs.   Current Outpatient Medications:    acetaminophen (TYLENOL) 325 MG tablet, Take 650 mg by mouth every 6 (six) hours as needed for moderate pain., Disp: , Rfl:    butalbital-acetaminophen-caffeine (FIORICET) 50-325-40 MG tablet, Take 1-2 tablets by mouth every 6 (six) hours as needed for headache., Disp: 30 tablet, Rfl: 0   clobetasol cream (TEMOVATE) 0.05 %, Apply 1 application topically 2 (two) times daily. (Patient not taking: Reported on 01/16/2019), Disp: 30 g, Rfl: 3   clonazePAM (KLONOPIN) 1 MG tablet, Take 1 tablet (1 mg) by mouth qam, 1/2 tablet (0.5 mg) midday and 1 po (1 mg) qhs. You may take one extra during the day for severe panic attack. (Patient taking  differently: Take 0.5-1 mg by mouth See admin instructions. Take 1 tablet (1 mg) by mouth qam, 1/2 tablet (0.5 mg) midday and 1 po (1 mg) qhs. You may take one extra during the day for severe panic attack.), Disp: 70 tablet, Rfl: 0   diclofenac sodium (VOLTAREN) 1 % GEL, Apply 4 g topically 4 (four) times daily., Disp: 4 Tube, Rfl: 0   gabapentin (NEURONTIN) 300 MG capsule, Take 2 capsules (600 mg total) by mouth 3 (three) times daily. Take 2 capsules 3 times a day. (Patient taking differently: Take 600 mg by mouth 3 (three) times daily. ), Disp: 120 capsule, Rfl: 1   HYDROcodone-acetaminophen (NORCO/VICODIN) 5-325 MG tablet, Take 1 tablet by mouth every 6 (six) hours as needed for severe pain. (Patient taking differently: Take 0.5-1 tablets by mouth every 6 (six) hours as needed for severe pain. ), Disp: 15 tablet, Rfl: 0   lidocaine (XYLOCAINE) 2 % solution, Use as directed 2 mLs in the mouth or throat as needed for mouth pain., Disp: 15 mL, Rfl: 0   magic mouthwash w/lidocaine SOLN, Take 5 mLs by mouth 4 (four) times  daily as needed for mouth pain (swich and swallow)., Disp: 120 mL, Rfl: 0   prochlorperazine (COMPAZINE) 10 MG tablet, Take 1 tablet (10 mg total) by mouth every 6 (six) hours as needed for nausea or vomiting. (Patient not taking: Reported on 01/06/2019), Disp: 30 tablet, Rfl: 2   traMADol (ULTRAM) 50 MG tablet, Take 1 tablet (50 mg total) by mouth every 12 (twelve) hours as needed. (Patient not taking: Reported on 01/16/2019), Disp: 60 tablet, Rfl: 0   venlafaxine XR (EFFEXOR XR) 37.5 MG 24 hr capsule, 1  qam (Patient taking differently: Take 37.5 mg by mouth daily with breakfast. ), Disp: 30 capsule, Rfl: 4   Vitamin D, Ergocalciferol, (DRISDOL) 1.25 MG (50000 UT) CAPS capsule, Take 50,000 Units by mouth every Wednesday. , Disp: , Rfl:   EXAM:   VITALS per patient if applicable: none reported  GENERAL: alert, oriented, appears well and in no acute distress  HEENT: atraumatic,  conjunttiva clear, no obvious abnormalities on inspection of external nose and ears  NECK: normal movements of the head and neck  LUNGS: on inspection no signs of respiratory distress, breathing rate appears normal, no obvious gross increased work of breathing, gasping or wheezing  CV: no obvious cyanosis  MS: moves all visible extremities without noticeable abnormality  PSYCH/NEURO: pleasant and cooperative, no obvious depression or anxiety, speech and thought processing grossly intact  ASSESSMENT AND PLAN:   Bullous pemphigoid -Pictures from emergency department and CT scans have been reviewed. -She has already under the care of a dermatologist, will leave direct treatment of bullous pemphigoid to him. -I do not believe she needs referral to GI at the moment for her dysphagia as I do think that it is likely that this will start to resolve on treatment for bullous pemphigoid, if not can certainly consider referral then. -I have agreed to give her 30 tablets of Vicodin today, we have discussed that I will not continue prescription long-term but okay to use temporarily until direct treatment for bullous pemphigoid takes effect. -Agree with her calling her psychiatrist for appointment due to degree of social stressors and direct relationship to flareup of bullous pemphigoid.    I discussed the assessment and treatment plan with the patient. The patient was provided an opportunity to ask questions and all were answered. The patient agreed with the plan and demonstrated an understanding of the instructions.   The patient was advised to call back or seek an in-person evaluation if the symptoms worsen or if the condition fails to improve as anticipated.    Sabrina JanEstela Hernandez Acosta, MD  [facility]

## 2019-01-18 ENCOUNTER — Telehealth: Payer: Self-pay | Admitting: Neurology

## 2019-01-18 NOTE — Telephone Encounter (Signed)
Patient spoke with after hours about symptomatic request. She is experiencing autoimmune disorder triggered by stress and blisters on left side of body. Went to ER Sunday and had CT scan, possible scar tissue no abscess. Hard to keep food down. Having to crush meds with yogurt. Feels swollen in throat. Painful by ear and shoulder, went to the dermatologist Monday. She was told to go to the ED Now. Thanks! The documentation is in Dr. Moises Blood box.

## 2019-01-21 ENCOUNTER — Telehealth (HOSPITAL_COMMUNITY): Payer: Self-pay | Admitting: Psychiatry

## 2019-01-21 ENCOUNTER — Ambulatory Visit (INDEPENDENT_AMBULATORY_CARE_PROVIDER_SITE_OTHER): Payer: No Typology Code available for payment source | Admitting: Family Medicine

## 2019-01-21 ENCOUNTER — Ambulatory Visit: Payer: Self-pay | Admitting: *Deleted

## 2019-01-21 ENCOUNTER — Other Ambulatory Visit: Payer: Self-pay

## 2019-01-21 DIAGNOSIS — L120 Bullous pemphigoid: Secondary | ICD-10-CM

## 2019-01-21 NOTE — Telephone Encounter (Signed)
Pt reports H/O Bullous Pemphigoid. States blisters are spreading, now has 2 on face. States left sided; back, chest, breast and "Down entire left side to hip." States only new area is on face. Went to ED 01/15/2019 for dysphagia; states unresolved, "No worse." States "Still feels like something on left side of throat.  Saw Dr. Ardyth Harps 01/17/2019. Reports has finished steroids, day 7 of doxycycline. States pain medication not effective. Also states her psychiatrist  changed her meds; changed klonopin to Valium. States "Not helping with my stress at all." Questioned if she had called dermatologist and phychiatrist, states she has but they have not returned call as of yet (Called today.)  States under increased amount of stress lately, which exacerbates blisters.Pt states she feels like she is not being taken seriously by other MDs and is asking for advise from PCP. States if Dr. Ardyth Harps not available is would like to speak with Dr. Caryl Never. Pt tearful during call.TN called and spoke to [place]; will route to practice per instructions. Pts email and phone number verified. Instructed pt to go to ED if dysphagia, any other symptoms worsen. Pt verbalizes understanding.  Please advise: CB [phone] Reason for Disposition . Nursing judgment or information in reference  Answer Assessment - Initial Assessment Questions 1. REASON FOR CALL: "What is your main concern right now?"    Blisters spreading to face, 2. ONSET: "When did the *No Answer* start?"     Yesterday 3. SEVERITY: "How bad is the *No Answer*?"     painful 4. FEVER: "Do you have a fever?"     no 5. OTHER SYMPTOMS: "Do you have any other new symptoms?"     stress 6. INTERVENTIONS AND RESPONSE: "What have you done so far to try to make this better? What medications have you used?" H/O Bullous Pemphigold, steroids, antibiotics  Protocols used: NO GUIDELINE AVAILABLE-A-AH

## 2019-01-21 NOTE — Progress Notes (Signed)
Patient ID: Sabrina [name], female   DOB: [birth year], 53 y.o.   MRN: [id number]  This visit type was conducted due to national recommendations for restrictions regarding the COVID-19 pandemic in an effort to limit this patient's exposure and mitigate transmission in our community.   Virtual Visit via Video Note  I connected with [name] Mosey on 01/21/19 at  3:45 PM EDT by a video enabled telemedicine application and verified that I am speaking with the correct person using two identifiers.  Location patient: home Location provider:work or home office Persons participating in the virtual visit: patient, provider  I discussed the limitations of evaluation and management by telemedicine and the availability of in person appointments. The patient expressed understanding and agreed to proceed.   HPI: Patient has reported bullous pemphigus.  She states the main reason of calling today is persistent severe pain.  She has actually hooked up with a dermatologist in [place] and was recently treated with steroids and also started on doxycycline.  Her skin lesions did improve somewhat on the steroids.  She has follow-up with dermatologist tomorrow for skin biopsy.  She states she has had multiple recent new skin lesions including breast, face, back.  Her predominant complaint though is underlying pain in the areas where she has rash.  She does not describe any erythema or any fevers or chills.  She takes gabapentin already at baseline and is also on Effexor extended release 37.5 mg daily.  She is followed by psychiatrist.  Longstanding history of anxiety.  She feels her pain and symptoms are exacerbated by increased stressors.  She has upcoming knee surgery in June, and pending move from her current apartment in early June and she states her father is dying in [place].  He had stroke about 2 weeks ago and is not doing well.   ROS: See pertinent positives and negatives per HPI.  Past Medical History:   Diagnosis Date  . Arthritis    "knees" (09/06/2017)  . Bullous pemphigus   . Cat bite 06/2014   to left elbow  . History of blood transfusion 1988   "when I had my baby"  . Muscle weakness of lower extremity 2001; 09/05/2017   "resolved after a couple weeks; ?" (09/06/2017)  . 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 Poisoning, snake bite 04/08/2016   "[place]; RUE"  . PONV (postoperative nausea and vomiting)   . Situational anxiety   . Staph infection ~ 2015   "left elbow and finger"    Past Surgical History:  Procedure Laterality Date  . APPENDECTOMY  ~ 1987  . APPLICATION OF A-CELL OF EXTREMITY Left 08/05/2015   Procedure: APPLICATION OF A-CELL OF EXTREMITY;  Surgeon: Peggye Formlaire S Dillingham, DO;  Location: [facility];  Service: Plastics;  Laterality: Left;  . BREAST SURGERY Right 1990   "milk duct taken out"  . DEBRIDEMENT AND CLOSURE WOUND Left 07/01/2015   Procedure: LEFT ELBOW EXCISION OF WOUND WITH PRIMARY CLOSURE 2X5 CM ;  Surgeon: Peggye Formlaire S Dillingham, DO;  Location: [facility];  Service: Plastics;  Laterality: Left;  . ELBOW SURGERY Left X 23 in [place] <06/2015   from a cat bite; all I&D  . I&D EXTREMITY Left 07/08/2015   Procedure: IRRIGATION AND DEBRIDEMENT EXTREMITY, DRAINAGE OF LEFT ARM WOUND, A-CELL PLACEMENT, WOUND VAC PLACEMENT;  Surgeon: Alena Billslaire S Dillingham, DO;  Location: [facility];  Service: Plastics;  Laterality: Left;  . INCISION AND DRAINAGE OF WOUND Left 08/05/2015   Procedure:  IRRIGATION AND DEBRIDEMENT LEFT ELBOW WOUND, PLACEMENT OF ACELL;  Surgeon: Peggye Form, DO;  Location: [facility];  Service: Plastics;  Laterality: Left;  . LAPAROSCOPIC CHOLECYSTECTOMY  1998  . SKIN GRAFT Left 2016   took from anterior thigh; placed at elbow  . TONSILLECTOMY  ~ 2000  . TOTAL ABDOMINAL HYSTERECTOMY  2003  . WRIST SURGERY Right 01/2016    Family History  Problem Relation Age of Onset  . Liver disease Mother   .  Dementia Mother   . Cirrhosis Mother   . Prostate cancer Father   . Colon cancer Maternal Grandmother   . Ovarian cancer Maternal Aunt     SOCIAL HX: Works as a Armed forces training and education officer.  She is currently disabled.  Awaiting knee surgery as above.  Non-smoker.   Current Outpatient Medications:  .  acetaminophen (TYLENOL) 325 MG tablet, Take 650 mg by mouth every 6 (six) hours as needed for moderate pain., Disp: , Rfl:  .  butalbital-acetaminophen-caffeine (FIORICET) 50-325-40 MG tablet, Take 1-2 tablets by mouth every 6 (six) hours as needed for headache., Disp: 30 tablet, Rfl: 0 .  clobetasol cream (TEMOVATE) 0.05 %, Apply 1 application topically 2 (two) times daily. (Patient not taking: Reported on 01/16/2019), Disp: 30 g, Rfl: 3 .  diazepam (VALIUM) 10 MG tablet, Take 1 tablet (10 mg) twice a day for one week then, IF NEEDED, increase to 1 tablet (10 mg) in the morning and 2 tablets (20 mg) at bedtime, Disp: 90 tablet, Rfl: 0 .  diclofenac sodium (VOLTAREN) 1 % GEL, Apply 4 g topically 4 (four) times daily., Disp: 4 Tube, Rfl: 0 .  gabapentin (NEURONTIN) 300 MG capsule, Take 2 capsules (600 mg total) by mouth 3 (three) times daily. Take 2 capsules 3 times a day. (Patient taking differently: Take 600 mg by mouth 3 (three) times daily. ), Disp: 120 capsule, Rfl: 1 .  HYDROcodone-acetaminophen (NORCO/VICODIN) 5-325 MG tablet, Take 1 tablet by mouth every 6 (six) hours as needed for severe pain., Disp: 30 tablet, Rfl: 0 .  lidocaine (XYLOCAINE) 2 % solution, Use as directed 2 mLs in the mouth or throat as needed for mouth pain., Disp: 15 mL, Rfl: 0 .  magic mouthwash w/lidocaine SOLN, Take 5 mLs by mouth 4 (four) times daily as needed for mouth pain (swich and swallow)., Disp: 120 mL, Rfl: 0 .  prochlorperazine (COMPAZINE) 10 MG tablet, Take 1 tablet (10 mg total) by mouth every 6 (six) hours as needed for nausea or vomiting. (Patient not taking: Reported on 01/06/2019), Disp: 30 tablet, Rfl: 2 .  traMADol  (ULTRAM) 50 MG tablet, Take 1 tablet (50 mg total) by mouth every 12 (twelve) hours as needed. (Patient not taking: Reported on 01/16/2019), Disp: 60 tablet, Rfl: 0 .  venlafaxine XR (EFFEXOR XR) 37.5 MG 24 hr capsule, 1  qam (Patient taking differently: Take 37.5 mg by mouth daily with breakfast. ), Disp: 30 capsule, Rfl: 4 .  Vitamin D, Ergocalciferol, (DRISDOL) 1.25 MG (50000 UT) CAPS capsule, Take 50,000 Units by mouth every Wednesday. , Disp: , Rfl:   EXAM:  VITALS per patient if applicable:  GENERAL: alert, oriented, appears well and in no acute distress  HEENT: atraumatic, conjunttiva clear, no obvious abnormalities on inspection of external nose and ears  NECK: normal movements of the head and neck  LUNGS: on inspection no signs of respiratory distress, breathing rate appears normal, no obvious gross SOB, gasping or wheezing  CV: no obvious cyanosis  MS: moves all visible extremities without noticeable abnormality  PSYCH/NEURO: pleasant and cooperative, no obvious depression or anxiety, speech and thought processing grossly intact  ASSESSMENT AND PLAN:  Discussed the following assessment and plan:  Bullous pemphigoid -We again reiterated that she needs to keep close follow-up with dermatology for management of that.  She has scheduled visit with him tomorrow -She has multiple social stressors as above which are likely exacerbating her pain.  She already sees psychiatrist and they had suggested that she could increase her Effexor.  It sounds like some her pain may be neuropathic mediated.  She is already on gabapentin and will increase the Effexor to 75 mg daily -She already has some hydrocodone which she is using for severe breakthrough pain.  Will defer further pain management to primary -Did discussed possible increase counseling to help with some of her stress issues      I discussed the assessment and treatment plan with the patient. The patient was provided an  opportunity to ask questions and all were answered. The patient agreed with the plan and demonstrated an understanding of the instructions.   The patient was advised to call back or seek an in-person evaluation if the symptoms worsen or if the condition fails to improve as anticipated.     Evelena Peat, MD  [facility]

## 2019-01-21 NOTE — Telephone Encounter (Signed)
01/21/19 10:46am Patient called stating that she needed to speak with someone and that she is not going to hurt herself - I Nettie Elm) asked if it's was in reference to medication patient stated "No" informed the patient that I would give message to Dr. Donell Beers so he can called. Spoke with Dr. Donell Beers and gave him the patient information/sh

## 2019-01-22 ENCOUNTER — Other Ambulatory Visit: Payer: Self-pay | Admitting: Internal Medicine

## 2019-01-22 ENCOUNTER — Telehealth: Payer: Self-pay | Admitting: *Deleted

## 2019-01-22 ENCOUNTER — Ambulatory Visit: Payer: Self-pay | Admitting: *Deleted

## 2019-01-22 DIAGNOSIS — G629 Polyneuropathy, unspecified: Secondary | ICD-10-CM

## 2019-01-22 MED FILL — GABAPENTIN 300 MG CAPSULE: 300 | 20 days supply | Qty: 120 | Fill #0

## 2019-01-22 MED FILL — predniSONE 20 MG TABS: 20 | 21 days supply | Qty: 63 | Fill #0

## 2019-01-22 NOTE — Telephone Encounter (Signed)
Left message on machine for patient to return our call 

## 2019-01-22 NOTE — Telephone Encounter (Signed)
Definitely sorry to hear that she is still in pain. As I discussed with her before, there is not much that I can offer for treatment of bullous pemphigoid. She is already seeing dermatology, so she needs to let them guide her management. She is welcome to a second dermatology opinion if she wishes.

## 2019-01-22 NOTE — Progress Notes (Signed)
Virtual Visit via Video Note The purpose of this virtual visit is to provide medical care while limiting exposure to the novel coronavirus.    Consent was obtained for video visit:  Yes.   Answered questions that patient had about telehealth interaction:  Yes.   I discussed the limitations, risks, security and privacy concerns of performing an evaluation and management service by telemedicine. I also discussed with the patient that there may be a patient responsible charge related to this service. The patient expressed understanding and agreed to proceed.  Pt location: Home Physician Location: office Name of referring provider:  Philip Aspen, Almira Bar* I connected with [name] at patients initiation/request on 01/23/2019 at  9:00 AM EDT by video enabled telemedicine application and verified that I am speaking with the correct person using two identifiers. Pt MRN:  [id number] Pt DOB:  06/29/1966 Video Participants:  [name]   History of Present Illness:  Sabrina [name] is a 53 year old Caucasian woman with bullous pemphigoid and anxiety with panic disorder who presents for headaches.  History supplemented by referring provider notes.  Onset:  Late 20s.   Location:  Over left eyebrow Quality:  Stabbing, pounding, pressure Intensity:  8-9/10.  She denies new headache, thunderclap headache Aura:  No Premonitory Phase:  No Postdrome:  no Associated symptoms:  Nausea, vomiting.  She denies associated photophobia, phonophobia, unilateral numbness or weakness. Duration:  Usually 1 day.  Longest lasted 3 days. Frequency:  3 to 4 days a month but more frequently lately due to increased stress. Frequency of abortive medication: Fioricet 1-2 days a week.  Excedrin most days a week (for tension-type headache) Triggers:  Emotional stress Relieving factors:  Laying down Activity:  Aggravated when sitting up or standing upright She also has tension type headaches which are mild to  moderate bifrontal pressure, almost daily  Current NSAIDS:  none the so they have the other thing to add that I recommend is to Current analgesics:  Excedrin, Goody powder, acetaminophen, Vicodin (for pain due to blisters) Current triptans:  none Current ergotamine:  none Current anti-emetic:  none   Current muscle relaxants:  none Current anti-anxiolytic:  Valium Current sleep aide:  none Current Antihypertensive medications:  none Current Antidepressant medications:  Venlafaxine XR 75mg  daily (increased from 37.5mg  just yesterday) Current Anticonvulsant medications:  Gabapentin 600mg  three times daily Current anti-CGRP:  none Current Vitamins/Herbal/Supplements:  D Current Antihistamines/Decongestants:  none Other therapy:  none Hormone/birth control:  none  Past NSAIDS:  naproxen Past analgesics:  tramadol Past abortive triptans:  none Past abortive ergotamine:  none Past muscle relaxants:  none Past anti-emetic:  Compazine 10mg , Phenergan, Zofran (allergy) Past antihypertensive medications:  none Past antidepressant medications:  Lexapro, Paxil, Zoloft Past anticonvulsant medications:  Topiramate 25mg  twice daily (on for one month) Past anti-CGRP:  none Past vitamins/Herbal/Supplements:  none Past antihistamines/decongestants:  none Other past therapies:  none  Caffeine:  No coffee or colas Diet:  Hydrates with water.  Smooties Exercise:  Tries to walk but has trouble with her knees.  Likes to ride her horse but first needs knee surgery Depression/Anxiety:  Significant anxiety with panic attacks.  Increased anxiety as father recently had a stroke in [place] and she was unable to visit him due to COVID-19.  She also has been struggling   Stress related to increased pain due to the bullous pemphigoid Other pain:  pain due to the bullous pemphigoid.  Bilateral knee pain Sleep hygiene:  poor  Family history of headache:  Father (migraines)  Past Medical History: Past Medical  History:  Diagnosis Date  . Arthritis    "knees" (09/06/2017)  . Bullous pemphigus   . Cat bite 06/2014   to left elbow  . History of blood transfusion 1988   "when I had my baby"  . Muscle weakness of lower extremity 2001; 09/05/2017   "resolved after a couple weeks; ?" (09/06/2017)  . 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 Poisoning, snake bite 04/08/2016   "[place]; RUE"  . PONV (postoperative nausea and vomiting)   . Situational anxiety   . Staph infection ~ 2015   "left elbow and finger"    Medications: Outpatient Encounter Medications as of 01/23/2019  Medication Sig  . acetaminophen (TYLENOL) 325 MG tablet Take 650 mg by mouth every 6 (six) hours as needed for moderate pain.  . butalbital-acetaminophen-caffeine (FIORICET) 50-325-40 MG tablet Take 1-2 tablets by mouth every 6 (six) hours as needed for headache.  . clobetasol cream (TEMOVATE) 0.05 % Apply 1 application topically 2 (two) times daily. (Patient not taking: Reported on 01/16/2019)  . diazepam (VALIUM) 10 MG tablet Take 1 tablet (10 mg) twice a day for one week then, IF NEEDED, increase to 1 tablet (10 mg) in the morning and 2 tablets (20 mg) at bedtime  . diclofenac sodium (VOLTAREN) 1 % GEL Apply 4 g topically 4 (four) times daily.  Marland Kitchen gabapentin (NEURONTIN) 300 MG capsule Take 2 capsules (600 mg total) by mouth 3 (three) times daily. Take 2 capsules 3 times a day. (Patient taking differently: Take 600 mg by mouth 3 (three) times daily. )  . HYDROcodone-acetaminophen (NORCO/VICODIN) 5-325 MG tablet Take 1 tablet by mouth every 6 (six) hours as needed for severe pain.  Marland Kitchen lidocaine (XYLOCAINE) 2 % solution Use as directed 2 mLs in the mouth or throat as needed for mouth pain.  . magic mouthwash w/lidocaine SOLN Take 5 mLs by mouth 4 (four) times daily as needed for mouth pain (swich and swallow).  . prochlorperazine (COMPAZINE) 10 MG tablet Take 1 tablet (10 mg total) by mouth every 6 (six) hours as needed for nausea or  vomiting. (Patient not taking: Reported on 01/06/2019)  . traMADol (ULTRAM) 50 MG tablet Take 1 tablet (50 mg total) by mouth every 12 (twelve) hours as needed. (Patient not taking: Reported on 01/16/2019)  . venlafaxine XR (EFFEXOR XR) 37.5 MG 24 hr capsule 1  qam (Patient taking differently: Take 37.5 mg by mouth daily with breakfast. )  . Vitamin D, Ergocalciferol, (DRISDOL) 1.25 MG (50000 UT) CAPS capsule Take 50,000 Units by mouth every Wednesday.    No facility-administered encounter medications on file as of 01/23/2019.     Allergies: Allergies  Allergen Reactions  . Zofran [Ondansetron Hcl] Hives and Other (See Comments)    Spots around iv site  . Droperidol Palpitations  . Flexeril [Cyclobenzaprine] Anxiety  . Morphine And Related Itching  . Tessalon [Benzonatate] Other (See Comments)    Watery eyes  . Voltaren [Diclofenac Sodium] Rash    Family History: Family History  Problem Relation Age of Onset  . Liver disease Mother   . Dementia Mother   . Cirrhosis Mother   . Prostate cancer Father   . Colon cancer Maternal Grandmother   . Ovarian cancer Maternal Aunt     Social History: Social History   Socioeconomic History  . Marital status: Divorced    Spouse name: Not on file  . Number of  children: Not on file  . Years of education: Not on file  . Highest education level: Not on file  Occupational History  . Not on file  Social Needs  . Financial resource strain: Not on file  . Food insecurity:    Worry: Not on file    Inability: Not on file  . Transportation needs:    Medical: Not on file    Non-medical: Not on file  Tobacco Use  . Smoking status: Never Smoker  . Smokeless tobacco: Never Used  Substance and Sexual Activity  . Alcohol use: No    Alcohol/week: 0.0 standard drinks    Comment: 09/06/2017 "I'll have a glass of wine a couple times/year; maybe"  . Drug use: No  . Sexual activity: Not Currently  Lifestyle  . Physical activity:    Days per week:  Not on file    Minutes per session: Not on file  . Stress: Not on file  Relationships  . Social connections:    Talks on phone: Not on file    Gets together: Not on file    Attends religious service: Not on file    Active member of club or organization: Not on file    Attends meetings of clubs or organizations: Not on file    Relationship status: Not on file  . Intimate partner violence:    Fear of current or ex partner: Not on file    Emotionally abused: Not on file    Physically abused: Not on file    Forced sexual activity: Not on file  Other Topics Concern  . Not on file  Social History Narrative  . Not on file   Observations/Objective:   Blood pressure 96/62, pulse 99, temperature 98.2 F (36.8 C), height  (1.626 m), weight 168 lb (76.2 kg), SpO2 99% No acute distress.  Alert and oriented.  Speech fluent and not dysarthric.  Language intact.  Eyes orthophoric on primary gaze.  Face symmetric.  Assessment and Plan:   1.  Migraine without aura, without status migrainosus, not intractable 2.  Chronic tension type headaches, not intractable 3.  Anxiety with Panic disorder  1.  For preventative management, she will continue venlafaxine XR.  Dose was increased yesterday by her psychiatrist for anxiety.  It is a medication also used as migraine prophylaxis.  She will contact me in 6 weeks with update.  Of note, she did not optimize use of topiramate, so that is still an option (I would titrate to  at bedtime) 2.  For abortive therapy, rizatriptan  with promethazine  (for nausea).  May use Excedrin for tension-type headache.  She should stop Fioricet. 3.  Limit use of pain relievers to no more than 2 days out of week to prevent risk of rebound or medication-overuse headache. 4.  Keep headache diary 5.  Exercise, hydration, caffeine cessation, sleep hygiene, monitor for and avoid triggers 6.  Consider:  magnesium citrate  daily, riboflavin  daily, and  coenzyme Q10  three times daily 7. Always keep in mind that currently taking a hormone or birth control may be a possible trigger or aggravating factor for migraine. 8. Follow up in 4 months.  Follow Up Instructions:    -I discussed the assessment and treatment plan with the patient. The patient was provided an opportunity to ask questions and all were answered. The patient agreed with the plan and demonstrated an understanding of the instructions.   The patient was advised to call back  or seek an in-person evaluation if the symptoms worsen or if the condition fails to improve as anticipated.  Cira ServantAdam Robert Rhylee Pucillo, DO

## 2019-01-22 NOTE — Telephone Encounter (Signed)
Pt saw Dr. Caryl Never yesterday as well

## 2019-01-22 NOTE — Telephone Encounter (Signed)
Copied from CRM [id number]. Topic: General - Call Back - No Documentation >> Jan 22, 2019  2:31 PM Angela Nevin wrote: Reason for CRM: Patient returning call to [facility].

## 2019-01-22 NOTE — Telephone Encounter (Signed)
Spoke with patient and gave Dr Ardyth Harps recommendations.  She will call Dr Dorita Sciara office to reschedule her appointment.  She will call back if needed.

## 2019-01-22 NOTE — Telephone Encounter (Signed)
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, and lower back pain; the pt says that she has a neurology appt on 01/23/2019, but is having pain, can not sleep; she is also having pain in her chest and breasts, but the previously prescribed gabapentin, Effexor, valium,and Vicodin; she says that dermatology gave her steroids and Vicodin, but she says that her pain is not going away; she wonders if ultram will help her with the pain; the pt says that she can not use Voltaren gel because it burns her skin; the pt was seen by Dr Caryl Never on 5/11, and Dr Ardyth Harps on 5/7; the pt says that she is unable to sleep due to the pain; she does not want to go to the emergency room, and wonders what else she can do; the pt normally sees Dr Ardyth Harps at [facility]; spoke with Randa Evens at office and she requests this information be sent to the office; they will contact the pt; pt notified, and verbalized understanding; will route to office per request.  Reason for Disposition . Caller has URGENT medication question about med that PCP prescribed and triager unable to answer question  Answer Assessment - Initial Assessment Questions 1. SYMPTOMS: "Do you have any symptoms?"     Pain due to autoimmune condition 2. SEVERITY: If symptoms are present, ask "Are they mild, moderate or severe?"     Can not sleep and losing weight  Protocols used: MEDICATION QUESTION CALL-A-AH

## 2019-01-23 ENCOUNTER — Telehealth (INDEPENDENT_AMBULATORY_CARE_PROVIDER_SITE_OTHER): Payer: No Typology Code available for payment source | Admitting: Neurology

## 2019-01-23 ENCOUNTER — Encounter: Payer: Self-pay | Admitting: Neurology

## 2019-01-23 ENCOUNTER — Other Ambulatory Visit: Payer: Self-pay

## 2019-01-23 VITALS — BP 96/62 | HR 99 | Temp 98.2°F | Ht 64.0 in | Wt 168.0 lb

## 2019-01-23 DIAGNOSIS — F410 Panic disorder [episodic paroxysmal anxiety] without agoraphobia: Secondary | ICD-10-CM

## 2019-01-23 DIAGNOSIS — G44229 Chronic tension-type headache, not intractable: Secondary | ICD-10-CM

## 2019-01-23 DIAGNOSIS — G43009 Migraine without aura, not intractable, without status migrainosus: Secondary | ICD-10-CM

## 2019-01-23 MED ORDER — PROMETHAZINE HCL 25 MG PO TABS: 25.0000 mg | ORAL_TABLET | Freq: Four times a day (QID) | ORAL | 0 refills | Status: DC | PRN

## 2019-01-23 MED ORDER — RIZATRIPTAN BENZOATE 10 MG PO TBDP: ORAL_TABLET | ORAL | 3 refills | Status: DC

## 2019-01-23 MED FILL — PROMETHAZINE 25 MG TABLET: 25 | 8 days supply | Qty: 30 | Fill #0

## 2019-01-23 MED FILL — RIZATRIPTAN 10 MG ODT: 10 | 30 days supply | Qty: 9 | Fill #0

## 2019-01-24 NOTE — H&P (Signed)
Chief Complaint: Painful right knee  HPI: Sabrina [name] is 53 years old female and has a chronic history of right knee pain. She notes that she had an injury in the 1990s and eventually had arthroscopy. She is not exactly sure of the procedure but that she had at least some "loose cartilage". Over the years she has had persistent pain to the point recently that she has had compromise of her ability to work and sleep. She was having pain mostly along the medial compartment of her knee with some swelling. She is tried several over-the-counter medicines including NSAIDs and Tylenol as well as a brace. She is also had a Workmen's Comp. injury that was not related to her knee and has had tramadol and hydrocodone without much relief of her pain. She is had pain both with and without weightbearing.  MRI scan of right knee demonstrates a complex tear of the medial meniscus associated with significant degenerative changes in the medial compartment. There are degenerative changes of the patellofemoral and lateral compartment as well. There was also an area of marrow edema in the proximal tibia that could be a stress reaction reaction. The radiologist also listed as a differential diagnosis the possibility of some infiltrative process.  Options of right total knee replacement versus arthroscopy were given but due to her age an arthroscopic debridement has been scheduled.      Past Medical History:  Diagnosis Date  . Arthritis    "knees" (09/06/2017)  . Bullous pemphigus   . Cat bite 06/2014   to left elbow  . History of blood transfusion 1988   "when I had my baby"  . Muscle weakness of lower extremity 2001; 09/05/2017   "resolved after a couple weeks; ?" (09/06/2017)  . 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 Poisoning, snake bite 04/08/2016   "[place]; RUE"  . PONV (postoperative nausea and vomiting)   . Situational anxiety   . Staph infection ~ 2015   "left elbow and finger"          Past Surgical History:  Procedure Laterality Date  . APPENDECTOMY  ~ 1987  . APPLICATION OF A-CELL OF EXTREMITY Left 08/05/2015   Procedure: APPLICATION OF A-CELL OF EXTREMITY;  Surgeon: Peggye Form, DO;  Location: [facility];  Service: Plastics;  Laterality: Left;  . BREAST SURGERY Right 1990   "milk duct taken out"  . DEBRIDEMENT AND CLOSURE WOUND Left 07/01/2015   Procedure: LEFT ELBOW EXCISION OF WOUND WITH PRIMARY CLOSURE 2X5 CM ;  Surgeon: Peggye Form, DO;  Location: [facility];  Service: Plastics;  Laterality: Left;  . ELBOW SURGERY Left X 23 in [place] <06/2015   from a cat bite; all I&D  . I&D EXTREMITY Left 07/08/2015   Procedure: IRRIGATION AND DEBRIDEMENT EXTREMITY, DRAINAGE OF LEFT ARM WOUND, A-CELL PLACEMENT, WOUND VAC PLACEMENT;  Surgeon: Alena Bills Dillingham, DO;  Location: [facility];  Service: Plastics;  Laterality: Left;  . INCISION AND DRAINAGE OF WOUND Left 08/05/2015   Procedure: IRRIGATION AND DEBRIDEMENT LEFT ELBOW WOUND, PLACEMENT OF ACELL;  Surgeon: Peggye Form, DO;  Location: [facility];  Service: Plastics;  Laterality: Left;  . LAPAROSCOPIC CHOLECYSTECTOMY  1998  . SKIN GRAFT Left 2016   took from anterior thigh; placed at elbow  . TONSILLECTOMY  ~ 2000  . TOTAL ABDOMINAL HYSTERECTOMY  2003         Family History  Problem Relation Age of Onset  . Liver  disease Mother   . Dementia Mother   . Cancer Mother   . Cancer Maternal Grandmother    Social History:  reports that she has never smoked. She has never used smokeless tobacco. She reports that she does not drink alcohol or use drugs.   No current facility-administered medications for this encounter.    Current Outpatient Medications  Medication Sig Dispense Refill  . acetaminophen (TYLENOL) 325 MG tablet Take 650 mg by mouth every 6 (six) hours as needed for moderate pain.    . clobetasol cream (TEMOVATE)  0.05 % Apply 1 application topically 2 (two) times daily. (Patient not taking: Reported on 01/16/2019) 30 g 3  . diazepam (VALIUM) 10 MG tablet Take 1 tablet (10 mg) twice a day for one week then, IF NEEDED, increase to 1 tablet (10 mg) in the morning and 2 tablets (20 mg) at bedtime 90 tablet 0  . diclofenac sodium (VOLTAREN) 1 % GEL Apply 4 g topically 4 (four) times daily. 4 Tube 0  . gabapentin (NEURONTIN) 300 MG capsule TAKE 2 CAPSULES BY MOUTH THREE TIMES DAILY 120 capsule 1  . HYDROcodone-acetaminophen (NORCO/VICODIN) 5-325 MG tablet Take 1 tablet by mouth every 6 (six) hours as needed for severe pain. 30 tablet 0  . lidocaine (XYLOCAINE) 2 % solution Use as directed 2 mLs in the mouth or throat as needed for mouth pain. 15 mL 0  . magic mouthwash w/lidocaine SOLN Take 5 mLs by mouth 4 (four) times daily as needed for mouth pain (swich and swallow). 120 mL 0  . promethazine (PHENERGAN) 25 MG tablet Take 1 tablet (25 mg total) by mouth every 6 (six) hours as needed for nausea or vomiting. 30 tablet 0  . rizatriptan (MAXALT-MLT) 10 MG disintegrating tablet Take 1 tablet earliest onset of migraine.  May repeat in 2 hours if needed.  Maximum 2 tablets in 24hrs 9 tablet 3  . venlafaxine XR (EFFEXOR XR) 37.5 MG 24 hr capsule 1  qam (Patient taking differently: Take 75 mg by mouth daily with breakfast. ) 30 capsule 4  . Vitamin D, Ergocalciferol, (DRISDOL) 1.25 MG (50000 UT) CAPS capsule Take 50,000 Units by mouth every Wednesday.       Allergies:       Allergies  lergen Reactions  . Zofran [Ondansetron Hcl] Hives and Other (See Comments)    Spots around iv site  . Droperidol Palpitations  . Flexeril [Cyclobenzaprine] Anxiety  . Morphine And Related Itching  . Tessalon [Benzonatate] Other (See Comments)    Watery eyes  . Voltaren [Diclofenac Sodium] Rash        LabResultsLast48Hours  No results found for this or any previous visit (from the past 48 hour(s)).    ImagingResults(Last48hours)  No results found.  MRI OF THE RIGHT KNEE WITHOUT CONTRAST  TECHNIQUE: Multiplanar, multisequence MR imaging of the knee was performed. No intravenous contrast was administered.  COMPARISON:  None.  FINDINGS: MENISCI  Medial meniscus: Complex tear of the posterior horn of the medial meniscus with a radial component and peripheral meniscal extrusion.  Lateral meniscus: Degeneration of the anterior horn of the lateral meniscus with maceration along the root.  LIGAMENTS  Cruciates: Intact ACL. ACL is expanded and increased in signal consistent with mucinous degeneration. Intact PCL.  Collaterals: Medial collateral ligament is intact. Lateral collateral ligament complex is intact.  CARTILAGE  Patellofemoral: Mild partial-thickness cartilage loss of the patellofemoral compartment with marginal osteophytes.  Medial: Extensive full-thickness cartilage loss of the medial femoral condyle  and medial tibial plateau with marginal osteophytes.  Lateral: Partial-thickness cartilage loss of the periphery of the lateral femorotibial compartment with marginal osteophytes.  Joint: Large joint effusion. Edema in Hoffa's fat. No plical thickening.  Popliteal Fossa:  No Baker cyst. Intact popliteus tendon.  Extensor Mechanism: Intact quadriceps tendon. Intact patellar tendon. Intact medial patellar retinaculum. Intact lateral patellar retinaculum. Intact MPFL.  Bones: No fracture or dislocation. Subcortical marrow edema in proximal mid anterior tibial epiphysis just anterior to the ACL insertion extending into the metaphysis and proximal diaphysis which is of indeterminate etiology.  Other: No  fluid collection or hematoma.  Muscles are normal.  IMPRESSION: 1. Tricompartmental cartilage abnormalities as described above most severe in the medial femorotibial compartment. 2. Complex tear of the posterior horn of the medial  meniscus with a radial component and peripheral meniscal extrusion. 3. Degeneration of the anterior horn of the lateral meniscus with maceration along the root. 4. Subcortical marrow edema in proximal mid anterior tibial epiphysis just anterior to the ACL insertion extending into the metaphysis and proximal diaphysis which is of indeterminate etiology. This may reflect stress reaction versus sequela healed fracture versus less likely an infiltrative process such as myeloma/leukemia. A follow-up MRI of right tibia/fibula is recommended in 3 months for re-evaluation.   Review of Systems  Constitutional: Negative for fatigue and fever.  HENT: Negative for ear pain.  Eyes: Negative for pain.  Respiratory: Negative for cough and shortness of breath.  Cardiovascular: Positive for leg swelling.  Gastrointestinal: Negative for constipation and diarrhea.  Genitourinary: Negative for difficulty urinating.  Musculoskeletal: Negative for back pain and neck pain.  Skin: Negative for rash.  Allergic/Immunologic: Negative for food allergies.  Neurological: Positive for weakness. Negative for numbness.  Hematological: Bruises/bleeds easily.  Psychiatric/Behavioral: Positive for sleep disturbance.    Physical Exam  Constitutional: She is oriented to person, place, and time. She appears well-developed and well-nourished.  HENT:  Head: Normocephalic and atraumatic.  Eyes: Pupils are equal, round, and reactive to light. Conjunctivae are normal.  Cardiovascular: Normal rate, regular rhythm, normal heart sounds and intact distal pulses.  Respiratory: Effort normal and breath sounds normal.  GI: Soft. Bowel sounds are normal.  Neurological: She is alert and oriented to person, place, and time.  Skin: Skin is warm and dry.  Psychiatric: She has a normal mood and affect. Her behavior is normal. Judgment and thought content normal.   Musculoskeletal: Pain predominantly on the medial  compartment but also some pain laterally and some early ecchymosis laterally from her fall. Able to fully extend her knee and flex over to instability.   Assessment/Plan Complex tear posterior horn of the medial meniscus with a radial component and peripheral meniscal extrusion.  Area of subchondral marrow edema in the proximal and mid anterior tibial epiphysis just anterior to the ACL.  Tricompartment osteoarthritis.  Plan is for arthroscopic debridement of the right knee.  She knows that this may not be beneficial with the amount of arthritis that she has but she would like to stay with a more conservative plan.  Oris Drone Daphine Deutscher [phone]  01/24/2019 8:32 AM

## 2019-01-25 ENCOUNTER — Encounter (HOSPITAL_COMMUNITY): Payer: Self-pay | Admitting: Licensed Clinical Social Worker

## 2019-01-25 ENCOUNTER — Other Ambulatory Visit: Payer: Self-pay

## 2019-01-25 ENCOUNTER — Ambulatory Visit (INDEPENDENT_AMBULATORY_CARE_PROVIDER_SITE_OTHER): Payer: Self-pay | Admitting: Licensed Clinical Social Worker

## 2019-01-25 DIAGNOSIS — F410 Panic disorder [episodic paroxysmal anxiety] without agoraphobia: Secondary | ICD-10-CM
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
## 2019-01-28 ENCOUNTER — Other Ambulatory Visit: Payer: Self-pay | Admitting: Internal Medicine

## 2019-01-28 ENCOUNTER — Encounter: Payer: Self-pay | Admitting: Internal Medicine

## 2019-01-28 DIAGNOSIS — L120 Bullous pemphigoid: Secondary | ICD-10-CM

## 2019-01-28 NOTE — H&P (Addendum)
Sabrina [name] is an 53 y.o. female.    Chief Complaint: Painful right knee  HPI: Sabrina [name] is a 53 year old white female and has a history of  Chronic right knee pain.  She notes that she had an injury in the 1990s and eventually had arthroscopy.  She is not exactly sure of the procedure but that she had at least some "loose cartilage".  Over the years she has had persistent pain to the point recently that she has had compromise of her ability to work and sleep.  She is having pain mostly along the medial compartment of her knee.  She is had some swelling.  She is tried several over-the-counter medicines including NSAIDs and Tylenol.  She is had a brace.  She is also had a Workmen's Comp. injury that was not related to her knee and has had tramadol and hydrocodone without much relief of her pain.  An MRI scan of right knee demonstrated a complex tear of the medial meniscus associated with significant degenerative changes in the medial compartment.  There were degenerative changes of the patellofemoral and lateral compartment as well.  There was also an area of marrow edema in the proximal tibia that could be a stress reaction reaction.  The radiologist also listed as a differential diagnosis the possibility of some infiltrative process.  Discussion was had of more conservative treatment versus that of arthroscopic debridement was had.  Also discussed the fact that at her age total knee replacement would be best to be postponed.  After further discussion she had decided upon arthroscopic debridement.     Past Medical History:  Diagnosis Date  . Arthritis    "knees" (09/06/2017)  . Bullous pemphigus   . Cat bite 06/2014   to left elbow  . History of blood transfusion 1988   "when I had my baby"  . Muscle weakness of lower extremity 2001; 09/05/2017   "resolved after a couple weeks; ?" (09/06/2017)  . 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 Poisoning, snake bite 04/08/2016   "[place]; RUE"  . PONV  (postoperative nausea and vomiting)   . Situational anxiety   . Staph infection ~ 2015   "left elbow and finger"    Past Surgical History:  Procedure Laterality Date  . APPENDECTOMY  ~ 1987  . APPLICATION OF A-CELL OF EXTREMITY Left 08/05/2015   Procedure: APPLICATION OF A-CELL OF EXTREMITY;  Surgeon: Peggye Formlaire S Dillingham, DO;  Location: [facility];  Service: Plastics;  Laterality: Left;  . BREAST SURGERY Right 1990   "milk duct taken out"  . DEBRIDEMENT AND CLOSURE WOUND Left 07/01/2015   Procedure: LEFT ELBOW EXCISION OF WOUND WITH PRIMARY CLOSURE 2X5 CM ;  Surgeon: Peggye Formlaire S Dillingham, DO;  Location: [facility];  Service: Plastics;  Laterality: Left;  . ELBOW SURGERY Left X 23 in [place] <06/2015   from a cat bite; all I&D  . I&D EXTREMITY Left 07/08/2015   Procedure: IRRIGATION AND DEBRIDEMENT EXTREMITY, DRAINAGE OF LEFT ARM WOUND, A-CELL PLACEMENT, WOUND VAC PLACEMENT;  Surgeon: Alena Billslaire S Dillingham, DO;  Location: [facility];  Service: Plastics;  Laterality: Left;  . INCISION AND DRAINAGE OF WOUND Left 08/05/2015   Procedure: IRRIGATION AND DEBRIDEMENT LEFT ELBOW WOUND, PLACEMENT OF ACELL;  Surgeon: Peggye Formlaire S Dillingham, DO;  Location: [facility];  Service: Plastics;  Laterality: Left;  . LAPAROSCOPIC CHOLECYSTECTOMY  1998  . SKIN GRAFT Left 2016   took from anterior thigh; placed at elbow  .  TONSILLECTOMY  ~ 2000  . TOTAL ABDOMINAL HYSTERECTOMY  2003  . WRIST SURGERY Right 01/2016    Family History  Problem Relation Age of Onset  . Liver disease Mother   . Dementia Mother   . Cirrhosis Mother   . Prostate cancer Father   . Colon cancer Maternal Grandmother   . Ovarian cancer Maternal Aunt    Social History:  reports that she has never smoked. She has never used smokeless tobacco. She reports current alcohol use. She reports that she does not use drugs.  Allergies:  Allergies  Allergen Reactions  . Zofran [Ondansetron Hcl] Hives  and Other (See Comments)    Spots around iv site  . Droperidol Palpitations  . Flexeril [Cyclobenzaprine] Anxiety  . Morphine And Related Itching  . Tessalon [Benzonatate] Other (See Comments)    Watery eyes  . Voltaren [Diclofenac Sodium] Rash    No medications prior to admission.   No current facility-administered medications for this encounter.    Current Outpatient Medications  Medication Sig Dispense Refill  . diazepam (VALIUM) 10 MG tablet Take 1 tablet (10 mg) twice a day for one week then, IF NEEDED, increase to 1 tablet (10 mg) in the morning and 2 tablets (20 mg) at bedtime 90 tablet 0  . gabapentin (NEURONTIN) 300 MG capsule TAKE 2 CAPSULES BY MOUTH THREE TIMES DAILY 120 capsule 1  . HYDROcodone-acetaminophen (NORCO/VICODIN) 5-325 MG tablet Take 1 tablet by mouth every 6 (six) hours as needed for severe pain. 30 tablet 0  . rizatriptan (MAXALT-MLT) 10 MG disintegrating tablet Take 1 tablet earliest onset of migraine.  May repeat in 2 hours if needed.  Maximum 2 tablets in 24hrs 9 tablet 3  . venlafaxine XR (EFFEXOR XR) 37.5 MG 24 hr capsule 2 capsules qam 60 capsule 4  . acetaminophen (TYLENOL) 325 MG tablet Take 650 mg by mouth every 6 (six) hours as needed for moderate pain.       No results found for this or any previous visit (from the past 48 hour(s)). No results found.  Review of Systems  Constitutional: Negative.   HENT: Negative.   Eyes: Negative.   Respiratory: Negative.   Cardiovascular: Negative.   Gastrointestinal: Negative.   Endocrine: Negative.   Genitourinary: Negative.   Musculoskeletal: Positive for back pain, gait problem and myalgias.  Allergic/Immunologic: Negative.   Hematological: Negative.   Psychiatric/Behavioral: Negative.       Vital signs: Blood pressure 96/62, pulse 99, temperature 98.2 F (36.8 C), height  (1.626 m), weight 168 lb (76.2 kg), SpO2 99%  Physical Exam  Constitutional: She is oriented to person, place, and  time. She appears well-developed and well-nourished.  HENT:  Head: Normocephalic and atraumatic.  Eyes: Pupils are equal, round, and reactive to light. Conjunctivae are normal.  Neck: Neck supple.  Cardiovascular: Normal rate and regular rhythm.  Respiratory: Effort normal and breath sounds normal.  GI: Soft. Bowel sounds are normal.  Neurological: She is alert and oriented to person, place, and time.  Skin: Skin is warm and dry.  Psychiatric: She has a normal mood and affect. Her behavior is normal. Judgment and thought content normal.     Assessment/Plan Tricompartmental cartilage abnormalities most severe in the medial compartment as well as a complex tear of the posterior horn of the medial meniscus with a radial component and peripheral meniscal extrusion.  There was an area of subcortical marrow edema in the proximal mid anterior tibial epiphysis just anterior to the  ACL that may reflect a stress reaction versus an infiltrative process.  Plan: At this time our plan is to do a diagnostic arthroscopy of the right knee with probable meniscectomy and chondroplasty of the right knee.  Jacqualine Code, PA-C 02/15/2019, 10:08 AM

## 2019-01-29 MED ORDER — HYDROCODONE-ACETAMINOPHEN 5-325 MG PO TABS: 1.0000 | ORAL_TABLET | Freq: Four times a day (QID) | ORAL | 0 refills | Status: DC | PRN

## 2019-01-29 MED FILL — HYDROCODON-APAP 5-325: 5-325 | 7 days supply | Qty: 30 | Fill #0

## 2019-02-01 ENCOUNTER — Other Ambulatory Visit (HOSPITAL_COMMUNITY): Payer: Self-pay | Admitting: Psychiatry

## 2019-02-05 ENCOUNTER — Other Ambulatory Visit (HOSPITAL_COMMUNITY): Payer: Self-pay | Admitting: Psychiatry

## 2019-02-05 MED FILL — RIZATRIPTAN 10 MG ODT: 10 | 30 days supply | Qty: 9 | Fill #1

## 2019-02-05 MED FILL — GABAPENTIN 300 MG CAPSULE: 300 | 20 days supply | Qty: 120 | Fill #1

## 2019-02-06 ENCOUNTER — Other Ambulatory Visit (HOSPITAL_COMMUNITY): Payer: Self-pay

## 2019-02-06 MED ORDER — VENLAFAXINE HCL ER 37.5 MG PO CP24: ORAL_CAPSULE | ORAL | 4 refills | Status: DC

## 2019-02-06 MED FILL — VENLAFAXINE HCL ER 37.5 MG: 37.5 | 30 days supply | Qty: 60 | Fill #0

## 2019-02-12 ENCOUNTER — Other Ambulatory Visit: Payer: Self-pay

## 2019-02-12 ENCOUNTER — Encounter (HOSPITAL_BASED_OUTPATIENT_CLINIC_OR_DEPARTMENT_OTHER): Payer: Self-pay | Admitting: *Deleted

## 2019-02-15 ENCOUNTER — Other Ambulatory Visit (HOSPITAL_COMMUNITY)
Admission: RE | Admit: 2019-02-15 | Discharge: 2019-02-15 | Disposition: A | Discharge status: Home / Self Care | Payer: HRSA Program | Source: Ambulatory Visit | Attending: Orthopaedic Surgery | Admitting: Orthopaedic Surgery

## 2019-02-15 ENCOUNTER — Other Ambulatory Visit: Payer: Self-pay

## 2019-02-15 DIAGNOSIS — Z1159 Encounter for screening for other viral diseases: Secondary | ICD-10-CM | POA: Diagnosis not present

## 2019-02-15 DIAGNOSIS — Z01812 Encounter for preprocedural laboratory examination: Secondary | ICD-10-CM | POA: Diagnosis present

## 2019-02-15 NOTE — Progress Notes (Signed)
Patient given Ensure presurgery drink with instructions to drink by 0430. Pt voiced understanding without questions.

## 2019-02-16 LAB — NOVEL CORONAVIRUS, NAA (HOSP ORDER, SEND-OUT TO REF LAB; TAT 18-24 HRS): SARS-CoV-2, NAA: NOT DETECTED

## 2019-02-18 ENCOUNTER — Encounter (HOSPITAL_COMMUNITY): Payer: Self-pay | Admitting: Licensed Clinical Social Worker

## 2019-02-18 ENCOUNTER — Other Ambulatory Visit (HOSPITAL_COMMUNITY): Payer: Self-pay | Admitting: Psychiatry

## 2019-02-18 ENCOUNTER — Ambulatory Visit (INDEPENDENT_AMBULATORY_CARE_PROVIDER_SITE_OTHER): Payer: No Typology Code available for payment source | Admitting: Licensed Clinical Social Worker

## 2019-02-18 DIAGNOSIS — F410 Panic disorder [episodic paroxysmal anxiety] without agoraphobia: Secondary | ICD-10-CM

## 2019-02-18 NOTE — Progress Notes (Signed)
  Virtual Visit via Telephone Note  I connected with Sabrina [name] on 02/18/19 at 12:30 PM EDT by telephone and verified that I am speaking with the correct person using two identifiers.  Location: Patient: Home  Provider: Office   I discussed the limitations, risks, security and privacy concerns of performing an evaluation and management service by telephone and the availability of in person appointments. I also discussed with the patient that there may be a patient responsible charge related to this service. The patient expressed understanding and agreed to proceed.    I discussed the assessment and treatment plan with the patient. The patient was provided an opportunity to ask questions and all were answered. The patient agreed with the plan and demonstrated an understanding of the instructions.   The patient was advised to call back or seek an in-person evaluation if the symptoms worsen or if the condition fails to improve as anticipated.  I provided 52 minutes of non-face-to-face time during this encounter.   Archie Balboa, LCAS-A     THERAPIST PROGRESS NOTE  Session Time: 12:30-1:30PM  Participation Level: Active  Behavioral Response: NAAlertEuthymic  Type of Therapy: Individual Therapy  Treatment Goals addressed: Anxiety  Interventions: CBT and Supportive  Summary: Sabrina [name] is a 53 y.o. female who presents with hx of anxiety and panic attacks. She is active and engaged and in good spirits during our 52 min telephone counseling session. PT would prefer video but states the internet at her new residence is not good enough. PT is now staying w/ her cousin and states it is going well. PT feels supported and loved by her cousin, though it is difficult to adjust to new living atmosphere. PT states she believes much of her previous anxiety was situational and feels "More capable lately" of getting through minor anxiety w/o having panic attacks. PT plans to go through a knee  surgery tomorrow and expects a recovery time of "a couple months". She hopes to be able to work again soon. PT discuses how her past traumas contribute to her current anxieties and feeling "out of control".    Suicidal/Homicidal: Nowithout intent/plan  Therapist Response: Counselor used open questions, active listening, and supportive encouragement. Counselor discuss strategies for self care and improving her coping skills for managing anxiety.   Plan: Return again in 1 weeks.  Diagnosis:    ICD-10-CM   1. Panic disorder without agoraphobia F41.0      Archie Balboa, LCAS-A 02/18/2019

## 2019-02-19 ENCOUNTER — Ambulatory Visit (HOSPITAL_BASED_OUTPATIENT_CLINIC_OR_DEPARTMENT_OTHER)
Admission: RE | Admit: 2019-02-19 | Discharge: 2019-02-19 | Disposition: A | Discharge status: Home / Self Care | Payer: No Typology Code available for payment source | Attending: Orthopaedic Surgery | Admitting: Orthopaedic Surgery

## 2019-02-19 ENCOUNTER — Ambulatory Visit (HOSPITAL_BASED_OUTPATIENT_CLINIC_OR_DEPARTMENT_OTHER): Payer: No Typology Code available for payment source | Admitting: Anesthesiology

## 2019-02-19 ENCOUNTER — Telehealth: Payer: Self-pay | Admitting: Orthopaedic Surgery

## 2019-02-19 ENCOUNTER — Encounter (HOSPITAL_BASED_OUTPATIENT_CLINIC_OR_DEPARTMENT_OTHER)
Admission: RE | Disposition: A | Discharge status: Home / Self Care | Payer: Self-pay | Source: Home / Self Care | Attending: Orthopaedic Surgery

## 2019-02-19 ENCOUNTER — Other Ambulatory Visit: Payer: Self-pay

## 2019-02-19 ENCOUNTER — Encounter (HOSPITAL_BASED_OUTPATIENT_CLINIC_OR_DEPARTMENT_OTHER): Payer: Self-pay

## 2019-02-19 DIAGNOSIS — S83231A Complex tear of medial meniscus, current injury, right knee, initial encounter: Secondary | ICD-10-CM | POA: Insufficient documentation

## 2019-02-19 DIAGNOSIS — M23241 Derangement of anterior horn of lateral meniscus due to old tear or injury, right knee: Secondary | ICD-10-CM

## 2019-02-19 DIAGNOSIS — M1711 Unilateral primary osteoarthritis, right knee: Secondary | ICD-10-CM | POA: Insufficient documentation

## 2019-02-19 DIAGNOSIS — Z888 Allergy status to other drugs, medicaments and biological substances status: Secondary | ICD-10-CM | POA: Insufficient documentation

## 2019-02-19 DIAGNOSIS — F419 Anxiety disorder, unspecified: Secondary | ICD-10-CM | POA: Insufficient documentation

## 2019-02-19 DIAGNOSIS — S83259A Bucket-handle tear of lateral meniscus, current injury, unspecified knee, initial encounter: Secondary | ICD-10-CM | POA: Diagnosis present

## 2019-02-19 DIAGNOSIS — M23322 Other meniscus derangements, posterior horn of medial meniscus, left knee: Secondary | ICD-10-CM | POA: Diagnosis present

## 2019-02-19 DIAGNOSIS — Z885 Allergy status to narcotic agent status: Secondary | ICD-10-CM | POA: Insufficient documentation

## 2019-02-19 DIAGNOSIS — S83281A Other tear of lateral meniscus, current injury, right knee, initial encounter: Secondary | ICD-10-CM | POA: Insufficient documentation

## 2019-02-19 DIAGNOSIS — M23221 Derangement of posterior horn of medial meniscus due to old tear or injury, right knee: Secondary | ICD-10-CM

## 2019-02-19 DIAGNOSIS — F329 Major depressive disorder, single episode, unspecified: Secondary | ICD-10-CM | POA: Insufficient documentation

## 2019-02-19 DIAGNOSIS — L120 Bullous pemphigoid: Secondary | ICD-10-CM

## 2019-02-19 DIAGNOSIS — Z79899 Other long term (current) drug therapy: Secondary | ICD-10-CM | POA: Insufficient documentation

## 2019-02-19 DIAGNOSIS — R51 Headache: Secondary | ICD-10-CM | POA: Insufficient documentation

## 2019-02-19 DIAGNOSIS — X58XXXA Exposure to other specified factors, initial encounter: Secondary | ICD-10-CM | POA: Insufficient documentation

## 2019-02-19 HISTORY — PX: CHONDROPLASTY: SHX5177

## 2019-02-19 HISTORY — PX: KNEE ARTHROSCOPY WITH MEDIAL MENISECTOMY: SHX5651

## 2019-02-19 SURGERY — ARTHROSCOPY, KNEE, WITH MEDIAL MENISCECTOMY
Anesthesia: General | Site: Knee | Laterality: Right

## 2019-02-19 MED ORDER — MEPERIDINE HCL 25 MG/ML IJ SOLN: 6.2500 mg | INTRAMUSCULAR | Status: DC | PRN

## 2019-02-19 MED ORDER — FENTANYL CITRATE (PF) 100 MCG/2ML IJ SOLN
25.0000 ug | INTRAMUSCULAR | Status: DC | PRN
  Administered 2019-02-19 (×2): 25 ug via INTRAVENOUS

## 2019-02-19 MED ORDER — SODIUM CHLORIDE 0.9 % IV SOLN: INTRAVENOUS | Status: DC

## 2019-02-19 MED ORDER — EPHEDRINE 5 MG/ML INJ
INTRAVENOUS | Status: AC
  Filled 2019-02-19: qty 10

## 2019-02-19 MED ORDER — MIDAZOLAM HCL 2 MG/2ML IJ SOLN
INTRAMUSCULAR | Status: AC
  Filled 2019-02-19: qty 2

## 2019-02-19 MED ORDER — PROMETHAZINE HCL 25 MG/ML IJ SOLN
INTRAMUSCULAR | Status: DC | PRN
  Administered 2019-02-19: 6.25 mg via INTRAVENOUS

## 2019-02-19 MED ORDER — METOCLOPRAMIDE HCL 5 MG/ML IJ SOLN: 10.0000 mg | Freq: Once | INTRAMUSCULAR | Status: DC | PRN

## 2019-02-19 MED ORDER — PHENYLEPHRINE 40 MCG/ML (10ML) SYRINGE FOR IV PUSH (FOR BLOOD PRESSURE SUPPORT)
PREFILLED_SYRINGE | INTRAVENOUS | Status: AC
  Filled 2019-02-19: qty 10

## 2019-02-19 MED ORDER — FENTANYL CITRATE (PF) 100 MCG/2ML IJ SOLN
50.0000 ug | INTRAMUSCULAR | Status: DC | PRN
  Administered 2019-02-19: 08:00:00 100 ug via INTRAVENOUS

## 2019-02-19 MED ORDER — SCOPOLAMINE 1 MG/3DAYS TD PT72
MEDICATED_PATCH | TRANSDERMAL | Status: AC
  Filled 2019-02-19: qty 1

## 2019-02-19 MED ORDER — LIDOCAINE 2% (20 MG/ML) 5 ML SYRINGE
INTRAMUSCULAR | Status: AC
  Filled 2019-02-19: qty 5

## 2019-02-19 MED ORDER — CHLORHEXIDINE GLUCONATE 4 % EX LIQD: 60.0000 mL | Freq: Once | CUTANEOUS | Status: DC

## 2019-02-19 MED ORDER — CEFAZOLIN SODIUM-DEXTROSE 2-4 GM/100ML-% IV SOLN
INTRAVENOUS | Status: AC
  Filled 2019-02-19: qty 100

## 2019-02-19 MED ORDER — DEXAMETHASONE SODIUM PHOSPHATE 10 MG/ML IJ SOLN
INTRAMUSCULAR | Status: AC
  Filled 2019-02-19: qty 1

## 2019-02-19 MED ORDER — PROPOFOL 10 MG/ML IV BOLUS
INTRAVENOUS | Status: AC
  Filled 2019-02-19: qty 20

## 2019-02-19 MED ORDER — SCOPOLAMINE 1 MG/3DAYS TD PT72
1.0000 | MEDICATED_PATCH | Freq: Once | TRANSDERMAL | Status: DC | PRN
  Administered 2019-02-19: 1.5 mg via TRANSDERMAL

## 2019-02-19 MED ORDER — OXYCODONE HCL 5 MG/5ML PO SOLN: 5.0000 mg | Freq: Once | ORAL | Status: DC | PRN

## 2019-02-19 MED ORDER — BUPIVACAINE-EPINEPHRINE 0.5% -1:200000 IJ SOLN
INTRAMUSCULAR | Status: DC | PRN
  Administered 2019-02-19: 20 mL

## 2019-02-19 MED ORDER — FENTANYL CITRATE (PF) 100 MCG/2ML IJ SOLN
INTRAMUSCULAR | Status: AC
  Filled 2019-02-19: qty 2

## 2019-02-19 MED ORDER — SODIUM CHLORIDE 0.9 % IR SOLN
Status: DC | PRN
  Administered 2019-02-19: 9000 mL

## 2019-02-19 MED ORDER — PROMETHAZINE HCL 25 MG/ML IJ SOLN
INTRAMUSCULAR | Status: AC
  Filled 2019-02-19: qty 1

## 2019-02-19 MED ORDER — LACTATED RINGERS IV SOLN
INTRAVENOUS | Status: DC
  Administered 2019-02-19: 07:00:00 via INTRAVENOUS

## 2019-02-19 MED ORDER — EPHEDRINE SULFATE 50 MG/ML IJ SOLN
INTRAMUSCULAR | Status: DC | PRN
  Administered 2019-02-19: 10 mg via INTRAVENOUS

## 2019-02-19 MED ORDER — SCOPOLAMINE 1 MG/3DAYS TD PT72: 1.0000 | MEDICATED_PATCH | TRANSDERMAL | Status: DC

## 2019-02-19 MED ORDER — MIDAZOLAM HCL 2 MG/2ML IJ SOLN
1.0000 mg | INTRAMUSCULAR | Status: DC | PRN
  Administered 2019-02-19: 2 mg via INTRAVENOUS

## 2019-02-19 MED ORDER — SUCCINYLCHOLINE CHLORIDE 200 MG/10ML IV SOSY
PREFILLED_SYRINGE | INTRAVENOUS | Status: AC
  Filled 2019-02-19: qty 10

## 2019-02-19 MED ORDER — BUPIVACAINE-EPINEPHRINE (PF) 0.5% -1:200000 IJ SOLN
INTRAMUSCULAR | Status: AC
  Filled 2019-02-19: qty 30

## 2019-02-19 MED ORDER — HYDROCODONE-ACETAMINOPHEN 5-325 MG PO TABS: 1.0000 | ORAL_TABLET | ORAL | 0 refills | Status: DC | PRN

## 2019-02-19 MED ORDER — OXYCODONE HCL 5 MG PO TABS: 5.0000 mg | ORAL_TABLET | Freq: Once | ORAL | Status: DC | PRN

## 2019-02-19 MED ORDER — PROPOFOL 10 MG/ML IV BOLUS
INTRAVENOUS | Status: DC | PRN
  Administered 2019-02-19: 200 mg via INTRAVENOUS

## 2019-02-19 MED ORDER — DEXAMETHASONE SODIUM PHOSPHATE 4 MG/ML IJ SOLN
INTRAMUSCULAR | Status: DC | PRN
  Administered 2019-02-19: 10 mg via INTRAVENOUS

## 2019-02-19 MED ORDER — LIDOCAINE HCL (CARDIAC) PF 100 MG/5ML IV SOSY
PREFILLED_SYRINGE | INTRAVENOUS | Status: DC | PRN
  Administered 2019-02-19 (×2): 50 mg via INTRAVENOUS

## 2019-02-19 MED FILL — HYDROCODON-APAP 5-325: 5-325 | 5 days supply | Qty: 60 | Fill #0

## 2019-02-19 SURGICAL SUPPLY — 39 items
BANDAGE ACE 6X5 VEL STRL LF (GAUZE/BANDAGES/DRESSINGS) ×2 IMPLANT
BLADE CUDA SHAVER 3.5 (BLADE) ×2 IMPLANT
BLADE EXCALIBUR 4.0X13 (MISCELLANEOUS) ×1 IMPLANT
BNDG GAUZE ELAST 4 BULKY (GAUZE/BANDAGES/DRESSINGS) ×2 IMPLANT
COVER WAND RF STERILE (DRAPES) IMPLANT
DISSECTOR  3.8MM X 13CM (MISCELLANEOUS) ×1
DISSECTOR 3.8MM X 13CM (MISCELLANEOUS) IMPLANT
DRAPE ARTHROSCOPY W/POUCH 90 (DRAPES) ×2 IMPLANT
DRSG EMULSION OIL 3X3 NADH (GAUZE/BANDAGES/DRESSINGS) ×2 IMPLANT
DURAPREP 26ML APPLICATOR (WOUND CARE) ×2 IMPLANT
ELECT MENISCUS 165MM 90D (ELECTRODE) IMPLANT
ELECT REM PT RETURN 9FT ADLT (ELECTROSURGICAL) ×2
ELECTRODE REM PT RTRN 9FT ADLT (ELECTROSURGICAL) IMPLANT
GLOVE BIO SURGEON STRL SZ 6.5 (GLOVE) ×1 IMPLANT
GLOVE BIOGEL PI IND STRL 6.5 (GLOVE) IMPLANT
GLOVE BIOGEL PI IND STRL 7.0 (GLOVE) IMPLANT
GLOVE BIOGEL PI IND STRL 8.5 (GLOVE) IMPLANT
GLOVE BIOGEL PI INDICATOR 6.5 (GLOVE) ×2
GLOVE BIOGEL PI INDICATOR 7.0 (GLOVE) ×1
GLOVE BIOGEL PI INDICATOR 8.5 (GLOVE) ×1
GLOVE ECLIPSE 6.5 STRL STRAW (GLOVE) ×1 IMPLANT
GLOVE ECLIPSE 8.0 STRL XLNG CF (GLOVE) ×2 IMPLANT
GLOVE SURG ORTHO 8.0 STRL STRW (GLOVE) ×2 IMPLANT
GOWN STRL REUS W/ TWL LRG LVL3 (GOWN DISPOSABLE) ×1 IMPLANT
GOWN STRL REUS W/TWL 2XL LVL3 (GOWN DISPOSABLE) ×2 IMPLANT
GOWN STRL REUS W/TWL LRG LVL3 (GOWN DISPOSABLE) ×1
HOLDER KNEE FOAM BLUE (MISCELLANEOUS) ×2 IMPLANT
KNEE WRAP E Z 3 GEL PACK (MISCELLANEOUS) ×2 IMPLANT
MANIFOLD NEPTUNE II (INSTRUMENTS) IMPLANT
PACK ARTHROSCOPY DSU (CUSTOM PROCEDURE TRAY) ×2 IMPLANT
PACK BASIN DAY SURGERY FS (CUSTOM PROCEDURE TRAY) ×2 IMPLANT
PACK ICE MAXI GEL EZY WRAP (MISCELLANEOUS) ×1 IMPLANT
PENCIL BUTTON HOLSTER BLD 10FT (ELECTRODE) IMPLANT
PORT APPOLLO RF 90DEGREE MULTI (SURGICAL WAND) ×2 IMPLANT
SUT ETHILON 4 0 PS 2 18 (SUTURE) IMPLANT
TOWEL GREEN STERILE FF (TOWEL DISPOSABLE) ×2 IMPLANT
TUBE CONNECTING 20X1/4 (TUBING) ×1 IMPLANT
TUBING ARTHROSCOPY IRRIG 16FT (MISCELLANEOUS) ×2 IMPLANT
WATER STERILE IRR 1000ML POUR (IV SOLUTION) ×2 IMPLANT

## 2019-02-19 NOTE — Telephone Encounter (Signed)
Patient called stating she noticed bleeding was coming through the ace wrap (a circle approximately the size of a golf ball).  Patient states when she lifted the ace wrap to look she noticed her bandages front and back were all saturated with blood.  Patient states she has her leg elevated with ice.  Patient states she hasn't noticed any additional bleeding.  Patient requested a return call.

## 2019-02-19 NOTE — Op Note (Signed)
PATIENT ID:      MERLEAN PIZZINI  MRN:     [id number] DOB/AGE:    11/11/1965 / 53 y.o.       OPERATIVE REPORT    DATE OF PROCEDURE:  02/19/2019       PREOPERATIVE DIAGNOSIS:   Tear medial meniscus right knee, tri compartmental arthritis                                                       Estimated body mass index is 33.72 kg/m as calculated from the following:   Height as of this encounter: 5\' 4"  (1.626 m).   Weight as of this encounter: 89.1 kg.     POSTOPERATIVE DIAGNOSIS:   Same with tdear of lateral meniscus                                                                    Estimated body mass index is 33.72 kg/m as calculated from the following:   Height as of this encounter: 5\' 4"  (1.626 m).   Weight as of this encounter: 89.1 kg.     PROCEDURE:  Procedure(s): RIGHT KNEE ARTHROSCOPY, CHONDROPLASTY M EDIAL AND LATERAL COMPARTMENTS, PARTIAL MEDIAL AND LATERAL MENISECTOMY      SURGEON:  Joni Fears, MD    ASSISTANT:   Biagio Borg, PA-C   (Present and scrubbed throughout the case, critical for assistance with exposure, retraction, instrumentation, and closure.)          ANESTHESIA: local and general     DRAINS: none :      TOURNIQUET TIME: * No tourniquets in log *    COMPLICATIONS:  None   CONDITION:  stable  PROCEDURE IN DETAIL: [place] 02/19/2019, 8:33 AM

## 2019-02-19 NOTE — Telephone Encounter (Signed)
Looks like you are already waiting for response regarding this.

## 2019-02-19 NOTE — Op Note (Signed)
NAME: Sabrina [name], [place] MEDICAL RECORD [id number] ACCOUNT [id number] DATE OF BIRTH:[birth year] FACILITY: [facility] LOCATION: [place], [place]  OPERATIVE REPORT  DATE OF PROCEDURE:  02/19/2019  PREOPERATIVE DIAGNOSES:  Complex tear, medial meniscus, right knee with tricompartmental degenerative arthritis.  POSTOPERATIVE DIAGNOSES:  Complex tear, medial meniscus, right knee with tricompartmental degenerative arthritis with tear of lateral meniscus.  PROCEDURE: 1.  Diagnostic arthroscopy, right knee. 2.  Partial medial and lateral meniscectomies. 3.  Chondroplasty medial and lateral compartments.  SURGEON:  Joni Fears, MD  ASSISTANT:  Biagio Borg, PA-C  ANESTHESIA:  General with local 0.5% Marcaine with epinephrine.  COMPLICATIONS:  None.  HISTORY:  A 53 year old female who has had progressive problems with her right knee to the point of compromise.  She has been working at a veterinarian's office and has been unable to stand for over 6 months based on the problem with her knee with  recurrent pain, swelling and sensation of her knee giving way.  She has had a recent MRI scan that demonstrated tricompartmental degenerative changes associated with a complex tear of the medial meniscus.  Because of her poor response to nonoperative  treatment, she is now to have an arthroscopic evaluation.  DESCRIPTION OF PROCEDURE:  The patient was met in the holding area and identified the right knee as appropriate operative site and marked it accordingly.  The patient was then transferred to room #1.  She was comfortably placed on the operating table and  general anesthesia was induced without difficulty.  Timeout was called.  The right lower extremity was then placed in a thigh holder.  The leg was prepped with DuraPrep from the thigh holder to the ankle.  Sterile draping was performed.  Timeout was called again.  Diagnostic arthroscopy was performed using a medial  and lateral parapatellar tendon puncture site.  I did infiltrate the portals with 0.5% Marcaine with epinephrine.  Arthroscope was initially placed through the lateral portal.  There was a positive effusion.  There was very minimal synovitis.  There was increased varus, and there was some tightness of the medial compartment.  The medial meniscus was macerated.  There was tearing below the posterior horn with evidence of what may have been an old partial medial meniscectomy.  There was considerable loss of articular cartilage on both sides of the joint involving at least  one-third of the tibial plateau and half of the femoral condyle.  Any loose articular cartilage was debrided.  The tearing of the meniscus was debrided with a Cuda shaver.  There appeared to be some thinning of the ACL and a large osteophyte in the intercondylar notch.  This was removed with basket forceps and a Cuda shaver.  In the lateral compartment, there was considerable maceration of the lateral meniscus.  There appeared to be a significant amount of extrusion, particularly at the midpoint and anteriorly with tearing anteriorly.  A partial lateral meniscectomy was  performed involving the anterior third.  There was some grade II and III changes of chondromalacia.  There were large osteophytes about the patella on both the patella and the femoral sides.  There was considerable compression, particularly anteriorly and laterally.  Again, there was very minimal synovitis.  The joint was then irrigated with saline solution.  The portals were left open and infiltrated 0.25% Marcaine with epinephrine.  A sterile bulky dressing was applied, followed by an Ace bandage.  PLAN:  Hydrocodone for pain.  Crutches.  Office 1 week.  LN/NUANCE  D:02/19/2019  T:02/19/2019 JOB:[id number]

## 2019-02-19 NOTE — Anesthesia Procedure Notes (Addendum)
Procedure Name: LMA Insertion Date/Time: 02/19/2019 7:37 AM Performed by: Willa Frater, CRNA Pre-anesthesia Checklist: Patient identified, Emergency Drugs available, Suction available and Patient being monitored Patient Re-evaluated:Patient Re-evaluated prior to induction Oxygen Delivery Method: Circle system utilized Preoxygenation: Pre-oxygenation with 100% oxygen Induction Type: IV induction Ventilation: Mask ventilation without difficulty LMA: LMA inserted LMA Size: 4.0 Number of attempts: 1 Airway Equipment and Method: Bite block Placement Confirmation: positive ETCO2 Tube secured with: Tape Dental Injury: Teeth and Oropharynx as per pre-operative assessment

## 2019-02-19 NOTE — Telephone Encounter (Signed)
PW patient 

## 2019-02-19 NOTE — Anesthesia Postprocedure Evaluation (Signed)
Anesthesia Post Note  Patient: Sabrina [name]  Procedure(s) Performed: RIGHT KNEE ARTHROSCOPY, DEBRIDEMENT, PARTIAL MEDIAL AND LATERAL MENISECTOMY (Right Knee) CHONDROPLASTY; EXCISION EXOSTOSIS (Right Knee)     Patient location during evaluation: PACU Anesthesia Type: General Level of consciousness: awake and alert and oriented Pain management: pain level controlled Vital Signs Assessment: post-procedure vital signs reviewed and stable Respiratory status: spontaneous breathing, nonlabored ventilation and respiratory function stable Cardiovascular status: blood pressure returned to baseline and stable Postop Assessment: no apparent nausea or vomiting Anesthetic complications: no    Last Vitals:  Vitals:   02/19/19 0845 02/19/19 0900  BP: 111/77 110/74  Pulse: (!) 106 99  Resp: (!) 9 11  Temp:    SpO2: 100% 100%    Last Pain:  Vitals:   02/19/19 0900  TempSrc:   PainSc: 2                  Ozzy Bohlken A.

## 2019-02-19 NOTE — Transfer of Care (Signed)
Immediate Anesthesia Transfer of Care Note  Patient: Sabrina [name]  Procedure(s) Performed: RIGHT KNEE ARTHROSCOPY, DEBRIDEMENT, PARTIAL MEDIAL AND LATERAL MENISECTOMY (Right Knee) CHONDROPLASTY; EXCISION EXOSTOSIS (Right Knee)  Patient Location: PACU  Anesthesia Type:General  Level of Consciousness: awake, alert  and oriented  Airway & Oxygen Therapy: Patient Spontanous Breathing and Patient connected to face mask oxygen  Post-op Assessment: Report given to RN and Post -op Vital signs reviewed and stable  Post vital signs: Reviewed and stable  Last Vitals:  Vitals Value Taken Time  BP    Temp    Pulse 125 02/19/2019  8:38 AM  Resp    SpO2 100 % 02/19/2019  8:38 AM  Vitals shown include unvalidated device data.  Last Pain:  Vitals:   02/19/19 0652  TempSrc: Oral  PainSc: 6          Complications: No apparent anesthesia complications

## 2019-02-19 NOTE — Discharge Instructions (Signed)
Change dressing on Friday morning, then place band-aids to the wounds  You may clean the incision with alcohol prior to redressing.Once the dressing is removed you may shower without a dressing.  Do not wash over the wound.    50 % WEIGHT BEARING WITH WALKER OR CRUTCHES FOR FIRST 24-48 HOURS THEN MAY WEIGHT BEAR AS TOLERATED.  WHEN IN BED HAVE  KNEE ELEVATED ABOVE LEVEL OF YOUR HEART.  USE THE ICE PACK THAT YOU HAVE BEEN GIVEN.    Post Anesthesia Home Care Instructions  Activity: Get plenty of rest for the remainder of the day. A responsible individual must stay with you for 24 hours following the procedure.  For the next 24 hours, DO NOT: -Drive a car -Paediatric nurse -Drink alcoholic beverages -Take any medication unless instructed by your physician -Make any legal decisions or sign important papers.  Meals: Start with liquid foods such as gelatin or soup. Progress to regular foods as tolerated. Avoid greasy, spicy, heavy foods. If nausea and/or vomiting occur, drink only clear liquids until the nausea and/or vomiting subsides. Call your physician if vomiting continues.  Special Instructions/Symptoms: Your throat may feel dry or sore from the anesthesia or the breathing tube placed in your throat during surgery. If this causes discomfort, gargle with warm salt water. The discomfort should disappear within 24 hours.  If you had a scopolamine patch placed behind your ear for the management of post- operative nausea and/or vomiting:  1. The medication in the patch is effective for 72 hours, after which it should be removed.  Wrap patch in a tissue and discard in the trash. Wash hands thoroughly with soap and water. 2. You may remove the patch earlier than 72 hours if you experience unpleasant side effects which may include dry mouth, dizziness or visual disturbances. 3. Avoid touching the patch. Wash your hands with soap and water after contact with the patch.

## 2019-02-19 NOTE — Telephone Encounter (Signed)
I called patient 

## 2019-02-19 NOTE — Telephone Encounter (Signed)
Please see below.

## 2019-02-19 NOTE — Telephone Encounter (Signed)
Should be ok- keep ice on knee. If still a problem in am have her come to the office

## 2019-02-19 NOTE — Anesthesia Preprocedure Evaluation (Signed)
Anesthesia Evaluation  Patient identified by MRN, date of birth, ID band Patient awake    Reviewed: Allergy & Precautions, NPO status , Patient's Chart, lab work & pertinent test results  History of Anesthesia Complications (+) PONV and history of anesthetic complications  Airway Mallampati: II  TM Distance: >3 FB Neck ROM: Full    Dental no notable dental hx. (+) Teeth Intact   Pulmonary neg pulmonary ROS,    Pulmonary exam normal breath sounds clear to auscultation       Cardiovascular negative cardio ROS Normal cardiovascular exam Rhythm:Regular Rate:Normal     Neuro/Psych  Headaches, PSYCHIATRIC DISORDERS Anxiety Depression    GI/Hepatic negative GI ROS, Neg liver ROS,   Endo/Other  Obesity  Renal/GU negative Renal ROS  negative genitourinary   Musculoskeletal  (+) Arthritis , Osteoarthritis,  TMM right knee   Abdominal (+) + obese,   Peds  Hematology Bullous pemphigoid   Anesthesia Other Findings   Reproductive/Obstetrics                             Anesthesia Physical Anesthesia Plan  ASA: II  Anesthesia Plan: General   Post-op Pain Management:    Induction: Intravenous  PONV Risk Score and Plan: 4 or greater and Scopolamine patch - Pre-op, Ondansetron, Treatment may vary due to age or medical condition, Dexamethasone and Midazolam  Airway Management Planned: LMA  Additional Equipment:   Intra-op Plan:   Post-operative Plan: Extubation in OR  Informed Consent: I have reviewed the patients History and Physical, chart, labs and discussed the procedure including the risks, benefits and alternatives for the proposed anesthesia with the patient or authorized representative who has indicated his/her understanding and acceptance.     Dental advisory given  Plan Discussed with: CRNA and Surgeon  Anesthesia Plan Comments:         Anesthesia Quick Evaluation

## 2019-02-19 NOTE — H&P (Signed)
The recent History & Physical has been reviewed. I have personally examined the patient today. There is no interval change to the documented History & Physical. The patient would like to proceed with the procedure.  Garald Balding 02/19/2019,  7:22 AM

## 2019-02-19 NOTE — Anesthesia Procedure Notes (Signed)
Performed by: Avea Mcgowen D, CRNA       

## 2019-02-20 ENCOUNTER — Ambulatory Visit (INDEPENDENT_AMBULATORY_CARE_PROVIDER_SITE_OTHER): Payer: No Typology Code available for payment source | Admitting: Orthopaedic Surgery

## 2019-02-20 ENCOUNTER — Encounter (HOSPITAL_BASED_OUTPATIENT_CLINIC_OR_DEPARTMENT_OTHER): Payer: Self-pay | Admitting: Orthopaedic Surgery

## 2019-02-20 VITALS — BP 113/82 | HR 92 | Ht 64.0 in | Wt 196.0 lb

## 2019-02-20 DIAGNOSIS — M1711 Unilateral primary osteoarthritis, right knee: Secondary | ICD-10-CM

## 2019-02-20 MED FILL — GABAPENTIN 300 MG CAPSULE: 300 | 20 days supply | Qty: 120 | Fill #0

## 2019-02-20 NOTE — Progress Notes (Signed)
Office Visit Note   Patient: Sabrina [name]           Date of Birth: 07/08/1966           MRN: [id number] Visit Date: 02/20/2019              Requested by: Philip AspenHernandez Acosta, Limmie PatriciaEstela Y, MD [address] [place], [place] [id number] PCP: Philip AspenHernandez Acosta, Limmie PatriciaEstela Y, MD   Assessment & Plan: Visit Diagnoses:  1. Unilateral primary osteoarthritis, right knee     Plan: Mrs. Darl PikesLanier underwent a right knee arthroscopy yesterday.  She had tears of both medial lateral menisci with considerable degenerative change particularly in the medial compartment and the patellofemoral joint with there were large areas of complete articular cartilage loss.  She has had some bleeding and I asked her to come into the office today for dressing change.  Her wounds are clean and dry without evidence of infection.  There is no calf pain.  I changed her dressing and applied an Aquacel dressing.  We will plan to see her back next week with weightbearing as tolerated.  If she continues to have bleeding I like to see her back tomorrow .discussed the surgical findings with her.  Has had a chronic problem with opioid analgesics and may have a difficult postoperative course with the pain  Follow-Up Instructions: Return in about 1 week (around 02/27/2019).   Orders:  No orders of the defined types were placed in this encounter.  No orders of the defined types were placed in this encounter.     Procedures: No procedures performed   Clinical Data: No additional findings.   Subjective: Right knee scope DOS 02/19/2019  Patient presents today with concerns of her right knee. She had a right knee scope yesterday with partial medial and lateral meniscectomies, and chondroplasty of medial and lateral compartments. She noticed some bleeding in her bandage yesterday afternoon and then removed some of the bandage on her own. She is coming in today to have everything checked because she fells like it is still bleeding. She is  taking hydrocodone for pain.  HPI  Review of Systems   Objective: Vital Signs: BP 113/82   Pulse 92   Ht 5\' 4"  (1.626 m)   Wt 196 lb (88.9 kg)   BMI 33.64 kg/m   Physical Exam  Ortho Exam resting removed from right knee.  No active bleeding from either of the 2 arthroscopic portals.  These were cleaned and an Aquacel dressing applied.  No calf pain.  Neurologically intact.  Small effusion  Specialty Comments:  No specialty comments available.  Imaging: No results found.   PMFS History: Patient Active Problem List   Diagnosis Date Noted  . Other meniscus derangements, posterior horn of medial meniscus, left knee 02/19/2019  . Bucket handle tear of lateral meniscus 02/19/2019  . Unilateral primary osteoarthritis, right knee 12/13/2018  . Panic disorder 12/13/2018  . Chronic headaches 12/13/2018  . Weakness 09/06/2017  . Depression 09/06/2017  . 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  . Generalized anxiety disorder with panic attacks 09/06/2017  . Right hand pain   . Wrist swelling   . Cellulitis of right upper extremity 04/11/2016  . Elbow pain 07/20/2015  . Anxiety 07/20/2015  . Tachycardia 07/04/2015  . 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HCC)   . Skin ulcer of upper arm, limited to breakdown of skin (HCC) 07/01/2015   Past Medical History:  Diagnosis Date  . Arthritis    "knees" (09/06/2017)  .  Bullous pemphigus   . Cat bite 06/2014   to left elbow  . History of blood transfusion 1988   "when I had my baby"  . Muscle weakness of lower extremity 2001; 09/05/2017   "resolved after a couple weeks; ?" (09/06/2017)  . 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[place])   . Poisoning, snake bite 04/08/2016   "[place]; RUE"  . PONV (postoperative nausea and vomiting)   . Situational anxiety   . Staph infection ~ 2015   "left elbow and finger"    Family History  Problem Relation Age of Onset  . Liver disease Mother   . Dementia Mother   . Cirrhosis Mother   . Prostate cancer Father   . Colon  cancer Maternal Grandmother   . Ovarian cancer Maternal Aunt     Past Surgical History:  Procedure Laterality Date  . APPENDECTOMY  ~ 1987  . APPLICATION OF A-CELL OF EXTREMITY Left 08/05/2015   Procedure: APPLICATION OF A-CELL OF EXTREMITY;  Surgeon: Wallace Going, DO;  Location: [facility];  Service: Plastics;  Laterality: Left;  . BREAST SURGERY Right 1990   "milk duct taken out"  . CHONDROPLASTY Right 02/19/2019   Procedure: CHONDROPLASTY; EXCISION EXOSTOSIS;  Surgeon: Garald Balding, MD;  Location: [facility];  Service: Orthopedics;  Laterality: Right;  . DEBRIDEMENT AND CLOSURE WOUND Left 07/01/2015   Procedure: LEFT ELBOW EXCISION OF WOUND WITH PRIMARY CLOSURE 2X5 CM ;  Surgeon: Wallace Going, DO;  Location: [facility];  Service: Plastics;  Laterality: Left;  . ELBOW SURGERY Left X 23 in [place] <06/2015   from a cat bite; all I&D  . I&D EXTREMITY Left 07/08/2015   Procedure: IRRIGATION AND DEBRIDEMENT EXTREMITY, DRAINAGE OF LEFT ARM WOUND, A-CELL PLACEMENT, WOUND VAC PLACEMENT;  Surgeon: Loel Lofty Dillingham, DO;  Location: [facility];  Service: Plastics;  Laterality: Left;  . INCISION AND DRAINAGE OF WOUND Left 08/05/2015   Procedure: IRRIGATION AND DEBRIDEMENT LEFT ELBOW WOUND, PLACEMENT OF ACELL;  Surgeon: Wallace Going, DO;  Location: [facility];  Service: Plastics;  Laterality: Left;  . KNEE ARTHROSCOPY WITH MEDIAL MENISECTOMY Right 02/19/2019   Procedure: RIGHT KNEE ARTHROSCOPY, DEBRIDEMENT, PARTIAL MEDIAL AND LATERAL MENISECTOMY;  Surgeon: Garald Balding, MD;  Location: [facility];  Service: Orthopedics;  Laterality: Right;  . LAPAROSCOPIC CHOLECYSTECTOMY  1998  . SKIN GRAFT Left 2016   took from anterior thigh; placed at elbow  . TONSILLECTOMY  ~ 2000  . TOTAL ABDOMINAL HYSTERECTOMY  2003  . WRIST SURGERY Right 01/2016   Social History   Occupational History  . Occupation:  unemployed  Tobacco Use  . Smoking status: Never Smoker  . Smokeless tobacco: Never Used  Substance and Sexual Activity  . Alcohol use: Yes    Alcohol/week: 0.0 standard drinks    Comment: social  . Drug use: No  . Sexual activity: Not Currently

## 2019-02-22 MED FILL — clonazePAM 0.5 MG TABS: 0.5 | 30 days supply | Qty: 90 | Fill #1

## 2019-02-25 ENCOUNTER — Other Ambulatory Visit (HOSPITAL_COMMUNITY): Payer: Self-pay

## 2019-02-25 MED ORDER — DIAZEPAM 10 MG PO TABS: ORAL_TABLET | ORAL | 0 refills | Status: DC

## 2019-02-25 MED FILL — DIAZEPAM 10 MG TABS: 10 | 32 days supply | Qty: 90 | Fill #0

## 2019-02-26 ENCOUNTER — Telehealth: Payer: Self-pay | Admitting: Neurology

## 2019-02-26 ENCOUNTER — Other Ambulatory Visit: Payer: Self-pay

## 2019-02-26 ENCOUNTER — Encounter: Payer: Self-pay | Admitting: Orthopaedic Surgery

## 2019-02-26 ENCOUNTER — Ambulatory Visit (INDEPENDENT_AMBULATORY_CARE_PROVIDER_SITE_OTHER): Payer: No Typology Code available for payment source | Admitting: Orthopaedic Surgery

## 2019-02-26 ENCOUNTER — Other Ambulatory Visit: Payer: Self-pay | Admitting: Neurology

## 2019-02-26 DIAGNOSIS — S83251D Bucket-handle tear of lateral meniscus, current injury, right knee, subsequent encounter: Secondary | ICD-10-CM

## 2019-02-26 MED ORDER — BUTALBITAL-APAP-CAFFEINE 50-325-40 MG PO TABS: 1.0000 | ORAL_TABLET | Freq: Four times a day (QID) | ORAL | 3 refills | Status: DC | PRN

## 2019-02-26 MED FILL — BUTALBITAL-APAP-CAFFEINE 50: 50-325-40 | 2 days supply | Qty: 10 | Fill #0

## 2019-02-26 NOTE — Telephone Encounter (Signed)
Headaches are above left eye. Lights and sound do not bother her, but any movement or opening her eyes causes her to vomit, which makes her headaches worse. Rizatriptan is not effective, even when repeated. She had knee surgery recently, and the narcotics they gave her for her knee weren't effective either. Pt states the only thing that has ever helped her, was when she first gets a headache, is to take 1-2 Fioricet.

## 2019-02-26 NOTE — Telephone Encounter (Signed)
Patient called in about migraine medications not working. Please call her back at [phone] and let her know what she can do. Thanks!

## 2019-02-26 NOTE — Telephone Encounter (Signed)
Since her headaches are infrequent, I sent a prescription for Fioricet.

## 2019-02-26 NOTE — Progress Notes (Signed)
Office Visit Note   Patient: Sabrina [name]           Date of Birth: [birth year]           MRN: [id number] Visit Date: 02/26/2019              Requested by: Sabrina [name], Sabrina Halsted, [name] Sabrina [name],  Sabrina [name] [id number] PCP: Sabrina [name], Sabrina Halsted, [name]   Assessment & Plan: Visit Diagnoses:  1. Bucket handle tear of lateral meniscus of right knee, unspecified whether old or current tear, subsequent encounter     Plan: 1 week status post right knee arthroscopy.  I saw [name] the day after surgery as she was having some bleeding from 1 of the portals.  I applied a new dressing.  She apparently had some more bleeding over the weekend and Dr. Marlou [name] saw her and sutured the portals.  She has had a little bit of "oozing" from the medial portal but no evidence of infection.  The wounds appear to be clean today we will just reapply waterproof Band-Aids.  Chronic pain is going to be an issue based on her past history.  She also had significant degenerative changes in all 3 compartments and I am somewhat pessimistic about pain relief given the arthroscopic findings.  Will apply a knee immobilizer which actually made her feel better.  Consider further pain meds at the end of the week.  Office 1 week  Follow-Up Instructions: Return in about 1 week (around 03/05/2019).   Orders:  No orders of the defined types were placed in this encounter.  No orders of the defined types were placed in this encounter.     Procedures: No procedures performed   Clinical Data: No additional findings.   Subjective: No chief complaint on file. 1 week status post right knee arthroscopy with partial tearing of the medial lateral meniscus and significant degenerative changes in all 3 compartments with areas of exposed subchondral bone.  Had a problem with some postop bleeding through 1 of the arthroscopic portals postoperatively.  Dr. Marlou [name] saw her over the weekend and sutured the portals.  She  has had minimal bleeding since.  Still having a fair amount of pain and using crutches.  HPI  Review of Systems   Objective: Vital Signs: There were no vitals taken for this visit.  Physical Exam  Ortho Exam right knee arthroscopic portals have been sutured.  No active bleeding.  Possibly small effusion but does have large knees.  This could be a hemarthrosis.  She can flex her knee 90 degrees and just about full extension.  No calf pain or popliteal pain.  Specialty Comments:  No specialty comments available.  Imaging: No results found.   PMFS History: Patient Active Problem List   Diagnosis Date Noted  . Other meniscus derangements, posterior horn of medial meniscus, left knee 02/19/2019  . Bucket handle tear of lateral meniscus 02/19/2019  . Unilateral primary osteoarthritis, right knee 12/13/2018  . Panic disorder 12/13/2018  . Chronic headaches 12/13/2018  . Weakness 09/06/2017  . Depression 09/06/2017  . 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  . Generalized anxiety disorder with panic attacks 09/06/2017  . Right hand pain   . Wrist swelling   . Cellulitis of right upper extremity 04/11/2016  . Elbow pain 07/20/2015  . Anxiety 07/20/2015  . Tachycardia 07/04/2015  . 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[facility])   . Skin ulcer of upper arm, limited to breakdown of skin ([place]) 07/01/2015  Past Medical History:  Diagnosis Date  . Arthritis    "knees" (09/06/2017)  . Bullous pemphigus   . Cat bite 06/2014   to left elbow  . History of blood transfusion 1988   "when I had my baby"  . Muscle weakness of lower extremity 2001; 09/05/2017   "resolved after a couple weeks; ?" (09/06/2017)  . 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 Poisoning, snake bite 04/08/2016   "[place]; RUE"  . PONV (postoperative nausea and vomiting)   . Situational anxiety   . Staph infection ~ 2015   "left elbow and finger"    Family History  Problem Relation Age of Onset  . Liver disease Mother   . Dementia Mother    . Cirrhosis Mother   . Prostate cancer Father   . Colon cancer Maternal Grandmother   . Ovarian cancer Maternal Aunt     Past Surgical History:  Procedure Laterality Date  . APPENDECTOMY  ~ 1987  . APPLICATION OF A-CELL OF EXTREMITY Left 08/05/2015   Procedure: APPLICATION OF A-CELL OF EXTREMITY;  Surgeon: Sabrina [name];  Location: [facility];  Service: Plastics;  Laterality: Left;  . BREAST SURGERY Right 1990   "milk duct taken out"  . CHONDROPLASTY Right 02/19/2019   Procedure: CHONDROPLASTY; EXCISION EXOSTOSIS;  Surgeon: Sabrina [name], Sabrina [name];  Location: [facility];  Service: Orthopedics;  Laterality: Right;  . DEBRIDEMENT AND CLOSURE WOUND Left 07/01/2015   Procedure: LEFT ELBOW EXCISION OF WOUND WITH PRIMARY CLOSURE 2X5 CM ;  Surgeon: Sabrina [name];  Location: [facility];  Service: Plastics;  Laterality: Left;  . ELBOW SURGERY Left X 23 in [place] <06/2015   from a cat bite; all I&D  . I&D EXTREMITY Left 07/08/2015   Procedure: IRRIGATION AND DEBRIDEMENT EXTREMITY, DRAINAGE OF LEFT ARM WOUND, A-CELL PLACEMENT, WOUND VAC PLACEMENT;  Surgeon: Sabrina Billslaire S Dillingham, [name];  Location: [facility];  Service: Plastics;  Laterality: Left;  . INCISION AND DRAINAGE OF WOUND Left 08/05/2015   Procedure: IRRIGATION AND DEBRIDEMENT LEFT ELBOW WOUND, PLACEMENT OF ACELL;  Surgeon: Sabrina [name];  Location: [facility];  Service: Plastics;  Laterality: Left;  . KNEE ARTHROSCOPY WITH MEDIAL MENISECTOMY Right 02/19/2019   Procedure: RIGHT KNEE ARTHROSCOPY, DEBRIDEMENT, PARTIAL MEDIAL AND LATERAL MENISECTOMY;  Surgeon: Sabrina [name], Sabrina Lindy [name], [name];  Location: [facility];  Service: Orthopedics;  Laterality: Right;  . LAPAROSCOPIC CHOLECYSTECTOMY  1998  . SKIN GRAFT Left 2016   took from anterior thigh; placed at elbow  . TONSILLECTOMY  ~ 2000  . TOTAL ABDOMINAL HYSTERECTOMY  2003  . WRIST SURGERY Right 01/2016    Social History   Occupational History  . Occupation: unemployed  Tobacco Use  . Smoking status: Never Smoker  . Smokeless tobacco: Never Used  Substance and Sexual Activity  . Alcohol use: Yes    Alcohol/week: 0.0 standard drinks    Comment: social  . Drug use: No  . Sexual activity: Not Currently     Sabrina BatmanPeter [name] Demetris Meinhardt, [name]   Note - This record has been created using [employer] software.  Chart creation errors have been sought, but may not always  have been located. Such creation errors [name] not reflect on  the standard of medical care.

## 2019-02-26 NOTE — Telephone Encounter (Signed)
Called and advised Pt of Rx

## 2019-02-27 ENCOUNTER — Other Ambulatory Visit: Payer: Self-pay | Admitting: Orthopaedic Surgery

## 2019-02-27 ENCOUNTER — Other Ambulatory Visit (INDEPENDENT_AMBULATORY_CARE_PROVIDER_SITE_OTHER): Payer: Self-pay | Admitting: Orthopedic Surgery

## 2019-02-27 ENCOUNTER — Other Ambulatory Visit: Payer: Self-pay | Admitting: *Deleted

## 2019-02-27 ENCOUNTER — Ambulatory Visit (HOSPITAL_COMMUNITY)
Admission: RE | Admit: 2019-02-27 | Discharge: 2019-02-27 | Disposition: A | Discharge status: Home / Self Care | Payer: Self-pay | Source: Ambulatory Visit | Attending: Orthopaedic Surgery | Admitting: Orthopaedic Surgery

## 2019-02-27 ENCOUNTER — Telehealth: Payer: Self-pay | Admitting: Orthopaedic Surgery

## 2019-02-27 ENCOUNTER — Ambulatory Visit (INDEPENDENT_AMBULATORY_CARE_PROVIDER_SITE_OTHER): Payer: Self-pay | Admitting: Orthopaedic Surgery

## 2019-02-27 ENCOUNTER — Encounter: Payer: Self-pay | Admitting: Orthopaedic Surgery

## 2019-02-27 ENCOUNTER — Ambulatory Visit (INDEPENDENT_AMBULATORY_CARE_PROVIDER_SITE_OTHER): Payer: No Typology Code available for payment source | Admitting: Internal Medicine

## 2019-02-27 DIAGNOSIS — M1711 Unilateral primary osteoarthritis, right knee: Secondary | ICD-10-CM

## 2019-02-27 DIAGNOSIS — L120 Bullous pemphigoid: Secondary | ICD-10-CM

## 2019-02-27 DIAGNOSIS — M25461 Effusion, right knee: Secondary | ICD-10-CM

## 2019-02-27 DIAGNOSIS — M25561 Pain in right knee: Secondary | ICD-10-CM | POA: Insufficient documentation

## 2019-02-27 MED ORDER — HYDROCODONE-ACETAMINOPHEN 5-325 MG PO TABS: 1.0000 | ORAL_TABLET | Freq: Four times a day (QID) | ORAL | 0 refills | Status: DC | PRN

## 2019-02-27 MED ORDER — LIDOCAINE HCL 1 % IJ SOLN
2.0000 mL | INTRAMUSCULAR | Status: AC | PRN
  Administered 2019-02-27: 2 mL

## 2019-02-27 MED FILL — HYDROCODON-APAP 5-325: 5-325 | 8 days supply | Qty: 30 | Fill #0

## 2019-02-27 NOTE — Telephone Encounter (Signed)
Patient calling due to continued swelling, and pain with rt knee. ( swelling from knee down to foot. Now going up thigh some) Patient is wearing brace, but not helping. Per patient swelling is continuing to get worse as day goes on, and she is worried. Please call to advise.

## 2019-02-27 NOTE — Telephone Encounter (Signed)
Patient scheduled for [facility] 02/27/[facility]. Dr. Durward Fortes will see patient in office following [place]. Patient is aware. Pain meds called into pharmacy by Dr. Durward Fortes.

## 2019-02-27 NOTE — Progress Notes (Signed)
Virtual Visit via Video Note  I connected with Sabrina [name] on 02/27/19 at 11:30 AM EDT by a video enabled telemedicine application and verified that I am speaking with the correct person using two identifiers.  Location patient: home Location provider: work office Persons participating in the virtual visit: patient, provider  I discussed the limitations of evaluation and management by telemedicine and the availability of in person appointments. The patient expressed understanding and agreed to proceed.   HPI: This is a follow up visit.  1. Her bullous pemphigoid has completely resolved. She completed a 3 week high dose steroid taper and doxycycline as directed by dermatology.  2. She had a right knee arthroscopy by Dr. Durward Fortes last week. She had some issues with bleeding that required stitches to the port holes. She is better. Still having some issues with pain control. She took 7 days of hydrocodone. She was also prescribed oxycodone that she didn't like because it made her feel foggy and sleepy.  She says that overall she is feeling much improved. Her father is still hospitalized after his CVA and PNA. He was just taken off the ventilator.   ROS: Constitutional: Denies fever, chills, diaphoresis, appetite change and fatigue.  HEENT: Denies photophobia, eye pain, redness, hearing loss, ear pain, congestion, sore throat, rhinorrhea, sneezing, mouth sores, trouble swallowing, neck pain, neck stiffness and tinnitus.   Respiratory: Denies SOB, DOE, cough, chest tightness,  and wheezing.   Cardiovascular: Denies chest pain, palpitations and leg swelling.  Gastrointestinal: Denies nausea, vomiting, abdominal pain, diarrhea, constipation, blood in stool and abdominal distention.  Genitourinary: Denies dysuria, urgency, frequency, hematuria, flank pain and difficulty urinating.  Endocrine: Denies: hot or cold intolerance, sweats, changes in hair or nails, polyuria, polydipsia.  Musculoskeletal: Denies myalgias, back pain, joint swelling, arthralgias and gait problem.  Skin: Denies pallor, rash and wound.  Neurological: Denies dizziness, seizures, syncope, weakness, light-headedness, numbness and headaches.  Hematological: Denies adenopathy. Easy bruising, personal or family bleeding history  Psychiatric/Behavioral: Denies suicidal ideation, mood changes, confusion, nervousness, sleep disturbance and agitation   Past Medical History:  Diagnosis Date  . Arthritis    "knees" (09/06/2017)  . Bullous pemphigus   . Cat bite 06/2014   to left elbow  . History of blood transfusion 1988   "when I had my baby"  . Muscle weakness of lower extremity 2001; 09/05/2017   "resolved after a couple weeks; ?" (09/06/2017)  . 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 Poisoning, snake bite 04/08/2016   "[place]; RUE"  . PONV (postoperative nausea and vomiting)   . Situational anxiety   . Staph infection ~ 2015   "left elbow and finger"    Past Surgical History:  Procedure Laterality Date  . APPENDECTOMY  ~ 1987  . APPLICATION OF A-CELL OF EXTREMITY Left 08/05/2015   Procedure: APPLICATION OF A-CELL OF EXTREMITY;  Surgeon: Wallace Going, DO;  Location: [facility];  Service: Plastics;  Laterality: Left;  . BREAST SURGERY Right 1990   "milk duct taken out"  . CHONDROPLASTY Right 02/19/2019   Procedure: CHONDROPLASTY; EXCISION EXOSTOSIS;  Surgeon: Garald Balding, MD;  Location: [facility];  Service: Orthopedics;  Laterality: Right;  . DEBRIDEMENT AND CLOSURE WOUND Left 07/01/2015   Procedure: LEFT ELBOW EXCISION OF WOUND WITH PRIMARY CLOSURE 2X5 CM ;  Surgeon: Wallace Going, DO;  Location: [facility];  Service: Plastics;  Laterality: Left;  . ELBOW SURGERY Left X 23 in  [place] <06/2015   from a cat bite; all I&D  . I&D EXTREMITY Left 07/08/2015   Procedure: IRRIGATION AND DEBRIDEMENT EXTREMITY, DRAINAGE OF LEFT ARM  WOUND, A-CELL PLACEMENT, WOUND VAC PLACEMENT;  Surgeon: Alena Billslaire S Dillingham, DO;  Location: [facility];  Service: Plastics;  Laterality: Left;  . INCISION AND DRAINAGE OF WOUND Left 08/05/2015   Procedure: IRRIGATION AND DEBRIDEMENT LEFT ELBOW WOUND, PLACEMENT OF ACELL;  Surgeon: Peggye Formlaire S Dillingham, DO;  Location: [facility];  Service: Plastics;  Laterality: Left;  . KNEE ARTHROSCOPY WITH MEDIAL MENISECTOMY Right 02/19/2019   Procedure: RIGHT KNEE ARTHROSCOPY, DEBRIDEMENT, PARTIAL MEDIAL AND LATERAL MENISECTOMY;  Surgeon: Valeria BatmanWhitfield, Peter W, MD;  Location: [facility];  Service: Orthopedics;  Laterality: Right;  . LAPAROSCOPIC CHOLECYSTECTOMY  1998  . SKIN GRAFT Left 2016   took from anterior thigh; placed at elbow  . TONSILLECTOMY  ~ 2000  . TOTAL ABDOMINAL HYSTERECTOMY  2003  . WRIST SURGERY Right 01/2016    Family History  Problem Relation Age of Onset  . Liver disease Mother   . Dementia Mother   . Cirrhosis Mother   . Prostate cancer Father   . Colon cancer Maternal Grandmother   . Ovarian cancer Maternal Aunt     SOCIAL HX:   reports that she has never smoked. She has never used smokeless tobacco. She reports current alcohol use. She reports that she does not use drugs.   Current Outpatient Medications:  .  butalbital-acetaminophen-caffeine (FIORICET) 50-325-40 MG tablet, Take 1 tablet by mouth every 6 (six) hours as needed for headache., Disp: 10 tablet, Rfl: 3 .  diazepam (VALIUM) 10 MG tablet, Take 1 tablet (10 mg) twice a day for one week then, IF NEEDED, increase to 1 tablet (10 mg) in the morning and 2 tablets (20 mg) at bedtime, Disp: 90 tablet, Rfl: 0 .  gabapentin (NEURONTIN) 300 MG capsule, TAKE 2 CAPSULES BY MOUTH THREE TIMES DAILY, Disp: 120 capsule, Rfl: 1 .  HYDROcodone-acetaminophen (NORCO/VICODIN) 5-325 MG tablet, Take 1-2 tablets by mouth every 4 (four) hours as needed for severe pain., Disp: 60 tablet, Rfl: 0 .  rizatriptan (MAXALT-MLT)  10 MG disintegrating tablet, Take 1 tablet earliest onset of migraine.  May repeat in 2 hours if needed.  Maximum 2 tablets in 24hrs, Disp: 9 tablet, Rfl: 3 .  venlafaxine XR (EFFEXOR XR) 37.5 MG 24 hr capsule, 2 capsules qam, Disp: 60 capsule, Rfl: 4  EXAM:   VITALS per patient if applicable: none reported  GENERAL: alert, oriented, appears well and in no acute distress  HEENT: atraumatic, conjunttiva clear, no obvious abnormalities on inspection of external nose and ears  NECK: normal movements of the head and neck  LUNGS: on inspection no signs of respiratory distress, breathing rate appears normal, no obvious gross increased work of breathing, gasping or wheezing  CV: no obvious cyanosis  MS: moves all visible extremities without noticeable abnormality  PSYCH/NEURO: pleasant and cooperative, no obvious depression or anxiety, speech and thought processing grossly intact  ASSESSMENT AND PLAN:   Bullous pemphigoid -Improved after steroid taper. -Will continue to follow up with dermatology as scheduled.  Osteoarthritis of right knee, unspecified osteoarthritis type -Will follow up with ortho as scheduled.  Routine follow up in office in 6 months.     I discussed the assessment and treatment plan with the patient. The patient was provided an opportunity to ask questions and all were answered. The patient agreed with the plan and demonstrated an  understanding of the instructions.   The patient was advised to call back or seek an in-person evaluation if the symptoms worsen or if the condition fails to improve as anticipated.    Chaya JanEstela Hernandez Acosta, MD  [facility]

## 2019-02-27 NOTE — Progress Notes (Signed)
Lower extremity venous has been completed.   Preliminary results in CV Proc.   Results given to Dr. Durward Fortes.   Sabrina [name] 02/27/2019 3:11 PM

## 2019-02-27 NOTE — Progress Notes (Signed)
Office Visit Note   Patient: Sabrina [name]           Date of Birth: 01/11/1966           MRN: [id number] Visit Date: 02/27/2019              Requested by: Isaac Bliss, Rayford Halsted, MD [place],  [place] [id number] PCP: Isaac Bliss, Rayford Halsted, MD   Assessment & Plan: Visit Diagnoses:  1. Unilateral primary osteoarthritis, right knee     Plan: 8 days status post right knee arthroscopy with a tear of the medial and lateral menisci and considerable tricompartmental degenerative arthritis particularly in the medial compartment. I aspirated 45 cc of somewhat bloody fluid from her knee but it appeared to be very thin and nonpurulent.  Will send to the lab.  She did feel better after the aspiration.  We will plan to see her back next week as scheduled.  Renew her hydrocodone.  No further drainage at this point.  Has had difficult time postoperatively with pain.  I saw her yesterday and her knee looked fine but she started having more pain overnight so we ordered a Doppler study.  This was performed this afternoon and was negative for DVT.  I was concerned about possible postop infection.  Thus, the reason for aspiration  Follow-Up Instructions: Return in about 1 week (around 03/06/2019).   Orders:  Orders Placed This Encounter  Procedures   Large Joint Inj: R knee   No orders of the defined types were placed in this encounter.     Procedures: Large Joint Inj: R knee on 02/27/2019 3:49 PM Indications: pain and diagnostic evaluation Details: 25 G 1.5 in needle, anteromedial approach  Arthrogram: No  Medications: 2 mL lidocaine 1 % Aspirate: 45 mL blood-tinged and serous; sent for lab analysis Procedure, treatment alternatives, risks and benefits explained, specific risks discussed. Consent was given by the patient. Immediately prior to procedure a time out was called to verify the correct patient, procedure, equipment, support staff and site/side marked as  required. Patient was prepped and draped in the usual sterile fashion.       Clinical Data: No additional findings.   Subjective: Chief Complaint  Patient presents with   Right Knee - Routine Post Op    Right knee scope DOS 02/19/19  Patient presents today now 8 days out from surgery. She had a right knee scope with medial and lateral meniscectomies, and chondroplasty of the medial and lateral compartments on 02/19/2019. She is experiencing increased pain since yesterday. She is unable to wear her knee immobilizer due to pain.   HPI  Review of Systems   Objective: Vital Signs: There were no vitals taken for this visit.  Physical Exam  Ortho Exam awake alert and oriented x3.  Uses 2 crutches and no weight on her right knee.  The arthroscopic portals appear to be clean I do not see any active drainage.  There is no redness or erythema.  There was some calf pain but the Doppler study was negative.  She appears to have either a hemarthrosis or effusion.  After aspiration of 45 cc of somewhat bloody fluid she did feel better.  Specialty Comments:  No specialty comments available.  Imaging: Vas [place] Lower Extremity Venous (dvt)  Result Date: 02/27/2019  Lower Venous Study Indications: Swelling, and Pain.  Performing Technologist: Abram Sander RVS  Examination Guidelines: A complete evaluation includes B-mode imaging, spectral Doppler, color Doppler,  and power Doppler as needed of all accessible portions of each vessel. Bilateral testing is considered an integral part of a complete examination. Limited examinations for reoccurring indications may be performed as noted.  +---------+---------------+---------+-----------+----------+-------+  RIGHT     Compressibility Phasicity Spontaneity Properties Summary  +---------+---------------+---------+-----------+----------+-------+  CFV       Full            Yes       Yes                              +---------+---------------+---------+-----------+----------+-------+  SFJ       Full                                                      +---------+---------------+---------+-----------+----------+-------+  FV Prox   Full                                                      +---------+---------------+---------+-----------+----------+-------+  FV Mid    Full                                                      +---------+---------------+---------+-----------+----------+-------+  FV Distal Full                                                      +---------+---------------+---------+-----------+----------+-------+  PFV       Full                                                      +---------+---------------+---------+-----------+----------+-------+  POP       Full            Yes       Yes                             +---------+---------------+---------+-----------+----------+-------+  PTV       Full                                                      +---------+---------------+---------+-----------+----------+-------+  PERO      Full                                                      +---------+---------------+---------+-----------+----------+-------+   +----+---------------+---------+-----------+----------+-------+  LEFT Compressibility Phasicity  Spontaneity Properties Summary  +----+---------------+---------+-----------+----------+-------+  CFV  Full            Yes       Yes                             +----+---------------+---------+-----------+----------+-------+     Summary: Right: There is no evidence of deep vein thrombosis in the lower extremity. No cystic structure found in the popliteal fossa. Left: No evidence of common femoral vein obstruction.  *See table(s) above for measurements and observations.    Preliminary      PMFS History: Patient Active Problem List   Diagnosis Date Noted   Other meniscus derangements, posterior horn of medial meniscus, left knee 02/19/2019   Bucket handle tear of  lateral meniscus 02/19/2019   Unilateral primary osteoarthritis, right knee 12/13/2018   Panic disorder 12/13/2018   Chronic headaches 12/13/2018   Weakness 09/06/2017   Depression 09/06/2017   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   Generalized anxiety disorder with panic attacks 09/06/2017   Right hand pain    Wrist swelling    Cellulitis of right upper extremity 04/11/2016   Elbow pain 07/20/2015   Anxiety 07/20/2015   Tachycardia 07/04/2015   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[facility])    Skin ulcer of upper arm, limited to breakdown of skin ([facility]) 07/01/2015   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[facility])    Poisoning, snake bite 04/08/2016   "[place]; RUE"   PONV (postoperative nausea and vomiting)    Situational anxiety    Staph infection ~ 2015   "left elbow and finger"    Family History  Problem Relation Age of Onset   Liver disease Mother    Dementia Mother    Cirrhosis Mother    Prostate cancer Father    Colon cancer Maternal Grandmother    Ovarian cancer Maternal Aunt     Past Surgical History:  Procedure Laterality Date   APPENDECTOMY  ~ 1987   APPLICATION OF A-CELL OF EXTREMITY Left 08/05/2015   Procedure: APPLICATION OF A-CELL OF EXTREMITY;  Surgeon: Peggye Formlaire S Dillingham, DO;  Location: [facility];  Service: Plastics;  Laterality: Left;   BREAST SURGERY Right 1990   "milk duct taken out"   CHONDROPLASTY Right 02/19/2019   Procedure: CHONDROPLASTY; EXCISION EXOSTOSIS;  Surgeon: Valeria BatmanWhitfield, Slaton Reaser W, MD;  Location: [facility];  Service: Orthopedics;  Laterality: Right;   DEBRIDEMENT AND CLOSURE WOUND Left 07/01/2015   Procedure: LEFT ELBOW  EXCISION OF WOUND WITH PRIMARY CLOSURE 2X5 CM ;  Surgeon: Peggye Formlaire S Dillingham, DO;  Location: [facility];  Service: Plastics;  Laterality: Left;   ELBOW SURGERY Left X 23 in [place] <06/2015   from a cat bite; all I&D   I&D EXTREMITY Left 07/08/2015   Procedure: IRRIGATION AND DEBRIDEMENT EXTREMITY, DRAINAGE OF LEFT ARM WOUND, A-CELL PLACEMENT, WOUND VAC PLACEMENT;  Surgeon: Alena Billslaire S Dillingham, DO;  Location: [facility];  Service: Plastics;  Laterality: Left;   INCISION AND DRAINAGE OF WOUND Left 08/05/2015   Procedure: IRRIGATION AND DEBRIDEMENT LEFT ELBOW WOUND, PLACEMENT OF ACELL;  Surgeon:  Alena Billslaire S Dillingham, DO;  Location: [facility];  Service: Plastics;  Laterality: Left;   KNEE ARTHROSCOPY WITH MEDIAL MENISECTOMY Right 02/19/2019   Procedure: RIGHT KNEE ARTHROSCOPY, DEBRIDEMENT, PARTIAL MEDIAL AND LATERAL MENISECTOMY;  Surgeon: Valeria BatmanWhitfield, Daniyah Fohl W, MD;  Location: [facility];  Service: Orthopedics;  Laterality: Right;   LAPAROSCOPIC CHOLECYSTECTOMY  1998   SKIN GRAFT Left 2016   took from anterior thigh; placed at elbow   TONSILLECTOMY  ~ 2000   TOTAL ABDOMINAL HYSTERECTOMY  2003   WRIST SURGERY Right 01/2016   Social History   Occupational History   Occupation: unemployed  Tobacco Use   Smoking status: Never Smoker   Smokeless tobacco: Never Used  Substance and Sexual Activity   Alcohol use: Yes    Alcohol/week: 0.0 standard drinks    Comment: social   Drug use: No   Sexual activity: Not Currently

## 2019-02-27 NOTE — Telephone Encounter (Signed)
Patient called to let Dr. Durward Fortes know that she did talk to the pharmacist at [facility] and they can fill a prescription of Hydrocodone for her.  Patient states her knee is very painful even when wearing the brace.  Patient is requesting the prescription be sent in ASAP.

## 2019-02-28 ENCOUNTER — Telehealth: Payer: Self-pay | Admitting: Orthopaedic Surgery

## 2019-02-28 NOTE — Telephone Encounter (Signed)
I'm not sure what you want me to recommend?

## 2019-02-28 NOTE — Telephone Encounter (Signed)
Please call.

## 2019-02-28 NOTE — Telephone Encounter (Signed)
Patient called stating "she is in so much pain that she cannot eat or sleep."  Patient states she is not getting any pain relief with the medication and using ice.  Patient requested a return call.

## 2019-02-28 NOTE — Telephone Encounter (Signed)
Called her.  She gave away her oxy to the sheriff.  Discussed OTC, Ice, etc Has appt Tuesday

## 2019-02-28 NOTE — Telephone Encounter (Signed)
thanks

## 2019-03-01 ENCOUNTER — Telehealth: Payer: Self-pay | Admitting: *Deleted

## 2019-03-01 ENCOUNTER — Telehealth: Payer: Self-pay | Admitting: Orthopaedic Surgery

## 2019-03-01 ENCOUNTER — Ambulatory Visit (INDEPENDENT_AMBULATORY_CARE_PROVIDER_SITE_OTHER): Payer: No Typology Code available for payment source | Admitting: Psychiatry

## 2019-03-01 ENCOUNTER — Other Ambulatory Visit: Payer: Self-pay

## 2019-03-01 ENCOUNTER — Emergency Department (HOSPITAL_BASED_OUTPATIENT_CLINIC_OR_DEPARTMENT_OTHER): Payer: Self-pay

## 2019-03-01 ENCOUNTER — Encounter (HOSPITAL_BASED_OUTPATIENT_CLINIC_OR_DEPARTMENT_OTHER): Payer: Self-pay | Admitting: *Deleted

## 2019-03-01 ENCOUNTER — Emergency Department (HOSPITAL_BASED_OUTPATIENT_CLINIC_OR_DEPARTMENT_OTHER)
Admission: EM | Admit: 2019-03-01 | Discharge: 2019-03-01 | Disposition: A | Discharge status: Home / Self Care | Payer: Self-pay | Attending: Emergency Medicine | Admitting: Emergency Medicine

## 2019-03-01 DIAGNOSIS — F410 Panic disorder [episodic paroxysmal anxiety] without agoraphobia: Secondary | ICD-10-CM

## 2019-03-01 DIAGNOSIS — Z79899 Other long term (current) drug therapy: Secondary | ICD-10-CM | POA: Insufficient documentation

## 2019-03-01 DIAGNOSIS — G8918 Other acute postprocedural pain: Secondary | ICD-10-CM | POA: Insufficient documentation

## 2019-03-01 DIAGNOSIS — W19XXXA Unspecified fall, initial encounter: Secondary | ICD-10-CM

## 2019-03-01 DIAGNOSIS — F4323 Adjustment disorder with mixed anxiety and depressed mood: Secondary | ICD-10-CM

## 2019-03-01 DIAGNOSIS — M25569 Pain in unspecified knee: Secondary | ICD-10-CM

## 2019-03-01 DIAGNOSIS — W109XXA Fall (on) (from) unspecified stairs and steps, initial encounter: Secondary | ICD-10-CM | POA: Insufficient documentation

## 2019-03-01 DIAGNOSIS — M25561 Pain in right knee: Secondary | ICD-10-CM | POA: Insufficient documentation

## 2019-03-01 MED ORDER — VENLAFAXINE HCL ER 37.5 MG PO CP24: ORAL_CAPSULE | ORAL | 5 refills | Status: DC

## 2019-03-01 MED ORDER — FENTANYL CITRATE (PF) 100 MCG/2ML IJ SOLN
50.0000 ug | Freq: Once | INTRAMUSCULAR | Status: AC
  Administered 2019-03-01: 50 ug via INTRAMUSCULAR
  Filled 2019-03-01: qty 2

## 2019-03-01 MED ORDER — DIAZEPAM 10 MG PO TABS: ORAL_TABLET | ORAL | 3 refills | Status: DC

## 2019-03-01 NOTE — Telephone Encounter (Signed)
Spoke with patient. She said that she is taking 2 tablets of the hydrocodone 5/325 every 4hr. She is taking Ibuprofen in between, along with ice and elevation. She said that she spoke with the oncall  PA last night and they recommended that she go to the ED. She did not go and is calling today stating that she cannot bear the pain and cannot go another day like this.

## 2019-03-01 NOTE — ED Triage Notes (Signed)
Fell,  Missed a step bent rt leg back  Had surgery on that knee on June 9  C/o rt knee pain

## 2019-03-01 NOTE — Telephone Encounter (Signed)
Patient calling to let you know she cannot tolerate the pain any longer. Ice, elevation, and pain medication not helping. Patient just does not know what to do. Please call to advise.

## 2019-03-01 NOTE — ED Provider Notes (Signed)
[facility] EMERGENCY DEPARTMENT Provider Note   CSN: [id number] Arrival date & time: 03/01/19  1304    History   Chief Complaint Chief Complaint  Patient presents with   Fall    HPI Sabrina [name] is a 53 y.o. female.     The history is provided by the patient and medical records. No language interpreter was used.  Fall   Sabrina [name] is a 53 y.o. female  with a PMH as listed below who presents to the Emergency Department complaining of right knee pain.  Patient states that she had surgery on June 9 on the right knee by Dr. Cleophas DunkerWhitfield.  She had right knee arthroscopy for tear of the medial and lateral meniscus.  Patient states that she has been having a hard time with postoperative pain.  She has been taking Norco as directed, but it is not helping.  She states that she saw Dr. August Saucerean with orthopedics a few days later who gave her some oxycodone, but she did not tolerate it well.  She then saw Dr. Cleophas DunkerWhitfield again 2 days ago for her persistent pain.  She did have a DVT study which was negative.  About 45 cc of fluid was aspirated from the knee per chart review at this appointment which was sent to the lab for analysis. Has not had any fever. No redness to the knee. Patient states that her pain has been persistent, but this morning, she was trying to get up and down her stairs and fell, causing her knee to flex completely back.  She had a worsening of her already severe pain.  She told me that she did call the orthopedist this morning, but they did not give any further recommendations, however she told me that she did not actually tell them that she fell today.  Past Medical History:  Diagnosis Date   Arthritis    "knees" (09/06/2017)   Bullous pemphigus    Cat bite 06/2014   to left elbow   History of blood transfusion 1988   "when I had my baby"   Muscle weakness of lower extremity 2001; 09/05/2017   "resolved after a couple weeks; ?" (09/06/2017)   Osteomyelitis  of elbow (HCC)    Poisoning, snake bite 04/08/2016   "[place]; RUE"   PONV (postoperative nausea and vomiting)    Situational anxiety    Staph infection ~ 2015   "left elbow and finger"    Patient Active Problem List   Diagnosis Date Noted   Other meniscus derangements, posterior horn of medial meniscus, left knee 02/19/2019   Bucket handle tear of lateral meniscus 02/19/2019   Unilateral primary osteoarthritis, right knee 12/13/2018   Panic disorder 12/13/2018   Chronic headaches 12/13/2018   Weakness 09/06/2017   Depression 09/06/2017   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   Generalized anxiety disorder with panic attacks 09/06/2017   Right hand pain    Wrist swelling    Cellulitis of right upper extremity 04/11/2016   Elbow pain 07/20/2015   Anxiety 07/20/2015   Tachycardia 07/04/2015   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HCC)    Skin ulcer of upper arm, limited to breakdown of skin (HCC) 07/01/2015    Past Surgical History:  Procedure Laterality Date   APPENDECTOMY  ~ 1987   APPLICATION OF A-CELL OF EXTREMITY Left 08/05/2015   Procedure: APPLICATION OF A-CELL OF EXTREMITY;  Surgeon: Peggye Formlaire S Dillingham, DO;  Location: [facility];  Service: Plastics;  Laterality: Left;   BREAST  SURGERY Right 1990   "milk duct taken out"   CHONDROPLASTY Right 02/19/2019   Procedure: CHONDROPLASTY; EXCISION EXOSTOSIS;  Surgeon: Valeria BatmanWhitfield, Peter W, MD;  Location: [facility];  Service: Orthopedics;  Laterality: Right;   DEBRIDEMENT AND CLOSURE WOUND Left 07/01/2015   Procedure: LEFT ELBOW EXCISION OF WOUND WITH PRIMARY CLOSURE 2X5 CM ;  Surgeon: Peggye Formlaire S Dillingham, DO;  Location: [facility];  Service: Plastics;  Laterality: Left;   ELBOW SURGERY Left X 23 in [place] <06/2015   from a cat bite; all I&D   I&D EXTREMITY Left 07/08/2015   Procedure: IRRIGATION AND DEBRIDEMENT EXTREMITY, DRAINAGE OF LEFT ARM WOUND, A-CELL PLACEMENT, WOUND VAC  PLACEMENT;  Surgeon: Alena Billslaire S Dillingham, DO;  Location: [facility];  Service: Plastics;  Laterality: Left;   INCISION AND DRAINAGE OF WOUND Left 08/05/2015   Procedure: IRRIGATION AND DEBRIDEMENT LEFT ELBOW WOUND, PLACEMENT OF ACELL;  Surgeon: Peggye Formlaire S Dillingham, DO;  Location: [facility];  Service: Plastics;  Laterality: Left;   KNEE ARTHROSCOPY WITH MEDIAL MENISECTOMY Right 02/19/2019   Procedure: RIGHT KNEE ARTHROSCOPY, DEBRIDEMENT, PARTIAL MEDIAL AND LATERAL MENISECTOMY;  Surgeon: Valeria BatmanWhitfield, Peter W, MD;  Location: [facility];  Service: Orthopedics;  Laterality: Right;   LAPAROSCOPIC CHOLECYSTECTOMY  1998   SKIN GRAFT Left 2016   took from anterior thigh; placed at elbow   TONSILLECTOMY  ~ 2000   TOTAL ABDOMINAL HYSTERECTOMY  2003   WRIST SURGERY Right 01/2016     OB History   No obstetric history on file.      Home Medications    Prior to Admission medications   Medication Sig Start Date End Date Taking? Authorizing Provider  butalbital-acetaminophen-caffeine (FIORICET) 50-325-40 MG tablet Take 1 tablet by mouth every 6 (six) hours as needed for headache. 02/26/19   Drema DallasJaffe, Adam R, DO  diazepam (VALIUM) 10 MG tablet Take 1 tablet (10 mg) twice a day for one week then, IF NEEDED, increase to 1 tablet (10 mg) in the morning and 2 tablets (20 mg) at bedtime 03/01/19   Plovsky, Earvin HansenGerald, MD  gabapentin (NEURONTIN) 300 MG capsule TAKE 2 CAPSULES BY MOUTH THREE TIMES DAILY 01/22/19   Philip AspenHernandez Acosta, Limmie PatriciaEstela Y, MD  HYDROcodone-acetaminophen (NORCO/VICODIN) 5-325 MG tablet Take 1-2 tablets by mouth every 4 (four) hours as needed for severe pain. 02/19/19   Jetty PeeksPetrarca, Brian D, PA-C  HYDROcodone-acetaminophen (NORCO/VICODIN) 5-325 MG tablet Take 1 tablet by mouth every 6 (six) hours as needed for moderate pain. 02/27/19   Valeria BatmanWhitfield, Peter W, MD  rizatriptan (MAXALT-MLT) 10 MG disintegrating tablet Take 1 tablet earliest onset of migraine.  May repeat in 2 hours if  needed.  Maximum 2 tablets in 24hrs 01/23/19   Drema DallasJaffe, Adam R, DO  venlafaxine XR (EFFEXOR XR) 37.5 MG 24 hr capsule 2 capsules qam 03/01/19   Archer AsaPlovsky, Gerald, MD    Family History Family History  Problem Relation Age of Onset   Liver disease Mother    Dementia Mother    Cirrhosis Mother    Prostate cancer Father    Colon cancer Maternal Grandmother    Ovarian cancer Maternal Aunt     Social History Social History   Tobacco Use   Smoking status: Never Smoker   Smokeless tobacco: Never Used  Substance Use Topics   Alcohol use: Yes    Alcohol/week: 0.0 standard drinks    Comment: social   Drug use: No     Allergies   Zofran [ondansetron hcl], Droperidol,  Flexeril [cyclobenzaprine], Morphine and related, Tessalon [benzonatate], and Voltaren [diclofenac sodium]   Review of Systems Review of Systems  Constitutional: Negative for fever.  Musculoskeletal: Positive for arthralgias, joint swelling and myalgias.  Skin: Negative for color change and wound.  Neurological: Negative for weakness and numbness.     Physical Exam Updated Vital Signs BP 95/70 Comment: Pt states her Systolic BP is never above 100    Pulse 99    Temp 98.6 F (37 C) (Oral)    Resp 18    Ht 5\' 4"  (1.626 m)    Wt 86.2 kg    SpO2 100%    BMI 32.61 kg/m   Physical Exam Vitals signs and nursing note reviewed.  Constitutional:      General: She is not in acute distress.    Appearance: She is well-developed.  HENT:     Head: Normocephalic and atraumatic.  Neck:     Musculoskeletal: Neck supple.  Cardiovascular:     Rate and Rhythm: Normal rate and regular rhythm.     Heart sounds: Normal heart sounds. No murmur.  Pulmonary:     Effort: Pulmonary effort is normal. No respiratory distress.     Breath sounds: Normal breath sounds.  Abdominal:     General: There is no distension.     Palpations: Abdomen is soft.     Tenderness: There is no abdominal tenderness.  Musculoskeletal:      Comments: Right knee diffusely tender to palpation. Arthroscopy wounds are c/d/i. No erythema or warmth. + swelling.   Decreased ROM 2/2 likely 2/2 pain and/or swelling. 2+ DP pulses bilaterally. All compartments are soft. Sensation intact distal to injury.  Skin:    General: Skin is warm and dry.  Neurological:     Mental Status: She is alert and oriented to person, place, and time.      ED Treatments / Results  Labs (all labs ordered are listed, but only abnormal results are displayed) Labs Reviewed - No data to display  EKG None  Radiology Dg Knee Complete 4 Views Right  Result Date: 03/01/2019 CLINICAL DATA:  Recent knee surgery (02/19/2019).  Fell today. EXAM: RIGHT KNEE - COMPLETE 4+ VIEW COMPARISON:  09/22/2018 FINDINGS: Stable age advanced tricompartmental degenerative changes with joint space narrowing and osteophytic spurring. No acute fracture or osteochondral lesion. There is a moderate-sized knee joint effusion. 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 No acute fracture is identified. Moderate to large joint effusion. Electronically Signed   By: Rudie MeyerP.  Gallerani M.D.   On: 03/01/2019 14:28   Vas [place] Lower Extremity Venous (dvt)  Result Date: 02/27/2019  Lower Venous Study Indications: Swelling, and Pain.  Performing Technologist: Blanch MediaMegan Riddle RVS  Examination Guidelines: A complete evaluation includes B-mode imaging, spectral Doppler, color Doppler, and power Doppler as needed of all accessible portions of each vessel. Bilateral testing is considered an integral part of a complete examination. Limited examinations for reoccurring indications may be performed as noted.  +---------+---------------+---------+-----------+----------+-------+  RIGHT     Compressibility Phasicity Spontaneity Properties Summary  +---------+---------------+---------+-----------+----------+-------+  CFV       Full            Yes       Yes                              +---------+---------------+---------+-----------+----------+-------+  SFJ       Full                                                      +---------+---------------+---------+-----------+----------+-------+  FV Prox   Full                                                      +---------+---------------+---------+-----------+----------+-------+  FV Mid    Full                                                      +---------+---------------+---------+-----------+----------+-------+  FV Distal Full                                                      +---------+---------------+---------+-----------+----------+-------+  PFV       Full                                                      +---------+---------------+---------+-----------+----------+-------+  POP       Full            Yes       Yes                             +---------+---------------+---------+-----------+----------+-------+  PTV       Full                                                      +---------+---------------+---------+-----------+----------+-------+  PERO      Full                                                      +---------+---------------+---------+-----------+----------+-------+   +----+---------------+---------+-----------+----------+-------+  LEFT Compressibility Phasicity Spontaneity Properties Summary  +----+---------------+---------+-----------+----------+-------+  CFV  Full            Yes       Yes                             +----+---------------+---------+-----------+----------+-------+     Summary: Right: There is no evidence of deep vein thrombosis in the lower extremity. No cystic structure found in the popliteal fossa. Left: No evidence of common femoral vein obstruction.  *See table(s) above for measurements and observations. Electronically signed by Gretta Began MD on 02/27/2019 at 5:08:18 PM.    Final     Procedures Procedures (including critical care time)  Medications Ordered in ED Medications  fentaNYL (SUBLIMAZE)  injection 50 mcg (50 mcg Intramuscular Given 03/01/19 1410)     Initial Impression / Assessment and Plan / ED Course  I have reviewed the triage vital signs and the nursing notes.  Pertinent  labs & imaging results that were available during my care of the patient were reviewed by me and considered in my medical decision making (see chart for details).       VELA RENDER is a 53 y.o. female who presents to ED for persistent postoperative knee pain since arthroscopically by Dr. Durward Fortes on June 9th with fall this morning which acutely exacerbated this pain.  On exam, neurovascularly intact and hemodynamically stable.  Does have a moderate amount of swelling to the knee which she states has been persistent.  Wounds appear well without signs of infection.  No redness or swelling.  She has had a DVT study already with orthopedics 2 days ago which was negative.  She also had aspiration of fluid on June 17 which to date has no growth and no organisms seen.  Doubt infectious etiology for cause of her post-op pain.  X-ray obtained to evaluate for injury from the fall with no acute injuries seen.  Does have moderate to large joint effusion. Discussed results with patient.  She has been prescribed #30 Norco on 6/17 by her orthopedist. She should follow up with ortho for further post-op pain recommendations if she feels this is not working well enough. Reasons to return to ER discussed and all questions answered.   Final Clinical Impressions(s) / ED Diagnoses   Final diagnoses:  Fall, initial encounter  Postoperative pain of knee    ED Discharge Orders    None       Shaneya Taketa, Ozella Almond, PA-C 03/01/19 1441    Quintella Reichert, MD 03/02/19 1318

## 2019-03-01 NOTE — Telephone Encounter (Signed)
Patient contacted the office stating she went to the ER due to a fall earlier in the day. Patient states she is in severe pain and "is doing everything she is supposed to be". Patient states she wanted [place] to be aware and to know if there were any further recommendations. Per Dr. Durward Fortes as the x-ray did not show a break that she is to continue with the current treatment plan and to follow up in the office on 03/05/19. Patient verbalized understanding. Before ending the call patient asked to see if Dr. Durward Fortes was on call in case she need to call this weekend. Patient advised I am unsure of who is on call.

## 2019-03-01 NOTE — Telephone Encounter (Signed)
Thanks-per our discussion her primary care MD prescribed valium and effexor this am-no other suggestions-continue with immobilizer and ice

## 2019-03-01 NOTE — Discharge Instructions (Signed)
It was my pleasure taking care of you today!   Fortunately, your x-ray today did not show signs of injury from your fall.   Continue home pain medication regimen as per your orthopedic doctor and follow up with them for further management.  Return to ER for new or worsening symptoms, any additional concerns.

## 2019-03-01 NOTE — Progress Notes (Signed)
BH MD/PA/NP OP Progress Note  03/01/2019 9:47 AM Claybon JabsDana L Dotts  MRN:  [id number]  Chief Complaint: Panic disorder without agoraphobia  Today the patient is doing somewhat better emotionally.  She still has significant physical psychosocial stressors.  Some good news some bands.  The good news is her skin lesions are much improved.  They have nearly recovered.  In fact she is going to get rebiopsied to clarify the diagnosis of this dermatological condition.  On the other hand the patient had a complicated knee surgery.  She is chronic pain from this.  She is having drainage from her knee.  She is seeing a number of different doctors for this condition.  Her pain is significant.  She receives hydrocodone from Dr. Cleophas DunkerWhitfield.  He is aware that she is also receiving 30 mg of Valium at the same time.  Patient is not oversedated.  Her father is somewhat stabilized.  He still on the ventilator but mainly at night.  Again she has minimal contact with him.  She guards all information to her sister and apparently there is some issues with the accuracy of what her sister tells him.  Patient says she is talking to her sister or that she is talked to her in 5 years.  Nonetheless she is seems to be when asked about what is happening with her problem.  She is very close to her father.  The good news also is that she started to see her therapist.  She has a return appointment this next week.  The other significant finding is that the patient's panic symptoms are minimal.  She failed Klonopin was changed to Tranxene which was too expensive.  She now takes Valium and it seems to have stabilized her.  She continues taking a low-dose of Effexor.   HPI:   Past Psychiatric History: See intake H&P for full details. Reviewed, with no updates at this time.   Past Medical History:  Past Medical History:  Diagnosis Date  . Arthritis    "knees" (09/06/2017)  . Bullous pemphigus   . Cat bite 06/2014   to left elbow  .  History of blood transfusion 1988   "when I had my baby"  . Muscle weakness of lower extremity 2001; 09/05/2017   "resolved after a couple weeks; ?" (09/06/2017)  . 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 Poisoning, snake bite 04/08/2016   "[place]; RUE"  . PONV (postoperative nausea and vomiting)   . Situational anxiety   . Staph infection ~ 2015   "left elbow and finger"    Past Surgical History:  Procedure Laterality Date  . APPENDECTOMY  ~ 1987  . APPLICATION OF A-CELL OF EXTREMITY Left 08/05/2015   Procedure: APPLICATION OF A-CELL OF EXTREMITY;  Surgeon: Peggye Formlaire S Dillingham, DO;  Location: [facility];  Service: Plastics;  Laterality: Left;  . BREAST SURGERY Right 1990   "milk duct taken out"  . CHONDROPLASTY Right 02/19/2019   Procedure: CHONDROPLASTY; EXCISION EXOSTOSIS;  Surgeon: Valeria BatmanWhitfield, Peter W, MD;  Location: [facility];  Service: Orthopedics;  Laterality: Right;  . DEBRIDEMENT AND CLOSURE WOUND Left 07/01/2015   Procedure: LEFT ELBOW EXCISION OF WOUND WITH PRIMARY CLOSURE 2X5 CM ;  Surgeon: Peggye Formlaire S Dillingham, DO;  Location: [facility];  Service: Plastics;  Laterality: Left;  . ELBOW SURGERY Left X 23 in [place] <06/2015   from a cat bite; all I&D  . I&D EXTREMITY Left 07/08/2015   Procedure: IRRIGATION  AND DEBRIDEMENT EXTREMITY, DRAINAGE OF LEFT ARM WOUND, A-CELL PLACEMENT, WOUND VAC PLACEMENT;  Surgeon: Loel Lofty Dillingham, DO;  Location: [facility];  Service: Plastics;  Laterality: Left;  . INCISION AND DRAINAGE OF WOUND Left 08/05/2015   Procedure: IRRIGATION AND DEBRIDEMENT LEFT ELBOW WOUND, PLACEMENT OF ACELL;  Surgeon: Wallace Going, DO;  Location: [facility];  Service: Plastics;  Laterality: Left;  . KNEE ARTHROSCOPY WITH MEDIAL MENISECTOMY Right 02/19/2019   Procedure: RIGHT KNEE ARTHROSCOPY, DEBRIDEMENT, PARTIAL MEDIAL AND LATERAL MENISECTOMY;  Surgeon: Garald Balding, MD;  Location: [facility];  Service: Orthopedics;  Laterality: Right;  . LAPAROSCOPIC CHOLECYSTECTOMY  1998  . SKIN GRAFT Left 2016   took from anterior thigh; placed at elbow  . TONSILLECTOMY  ~ 2000  . TOTAL ABDOMINAL HYSTERECTOMY  2003  . WRIST SURGERY Right 01/2016    Family Psychiatric History: See intake H&P for full details. Reviewed, with no updates at this time.   Family History:  Family History  Problem Relation Age of Onset  . Liver disease Mother   . Dementia Mother   . Cirrhosis Mother   . Prostate cancer Father   . Colon cancer Maternal Grandmother   . Ovarian cancer Maternal Aunt     Social History:  Social History   Socioeconomic History  . Marital status: Divorced    Spouse name: Not on file  . Number of children: 1  . Years of education: Not on file  . Highest education level: Associate degree: academic program  Occupational History  . Occupation: unemployed  Social Needs  . Financial resource strain: Not on file  . Food insecurity    Worry: Not on file    Inability: Not on file  . Transportation needs    Medical: Not on file    Non-medical: Not on file  Tobacco Use  . Smoking status: Never Smoker  . Smokeless tobacco: Never Used  Substance and Sexual Activity  . Alcohol use: Yes    Alcohol/week: 0.0 standard drinks    Comment: social  . Drug use: No  . Sexual activity: Not Currently  Lifestyle  . Physical activity    Days per week: Not on file    Minutes per session: Not on file  . Stress: Not on file  Relationships  . Social Herbalist on phone: Not on file    Gets together: Not on file    Attends religious service: Not on file    Active member of club or organization: Not on file    Attends meetings of clubs or organizations: Not on file    Relationship status: Not on file  Other Topics Concern  . Not on file  Social History Narrative   Pateint is right-handed. She lives alone in a single level home. She rarely drinks caffeine. She is  limited to exercise due to knee injuries.    Allergies:  Allergies  Allergen Reactions  . Zofran [Ondansetron Hcl] Hives and Other (See Comments)    Spots around iv site  . Droperidol Palpitations  . Flexeril [Cyclobenzaprine] Anxiety  . Morphine And Related Itching  . Tessalon [Benzonatate] Other (See Comments)    Watery eyes  . Voltaren [Diclofenac Sodium] Rash    Metabolic Disorder Labs: Lab Results  Component Value Date   HGBA1C 5.2 06/18/2018   MPG  07/05/2015    QUESTIONABLE IDENTIFICATION / INCORRECTLY LABELED SPECIMEN   No results found for: PROLACTIN Lab Results  Component Value Date   CHOL 209 (H) 06/18/2018   TRIG 71 06/18/2018   HDL 73 06/18/2018   CHOLHDL 2.9 06/18/2018   VLDL 29 04/27/2015   LDLCALC 122 (H) 06/18/2018   LDLCALC 138 (H) 04/27/2015   Lab Results  Component Value Date   TSH 2.890 06/18/2018   TSH  07/05/2015    QUESTIONABLE IDENTIFICATION / INCORRECTLY LABELED SPECIMEN    Therapeutic Level Labs: No results found for: LITHIUM No results found for: VALPROATE No components found for:  CBMZ  Current Medications: Current Outpatient Medications  Medication Sig Dispense Refill  . butalbital-acetaminophen-caffeine (FIORICET) 50-325-40 MG tablet Take 1 tablet by mouth every 6 (six) hours as needed for headache. 10 tablet 3  . diazepam (VALIUM) 10 MG tablet Take 1 tablet (10 mg) twice a day for one week then, IF NEEDED, increase to 1 tablet (10 mg) in the morning and 2 tablets (20 mg) at bedtime 90 tablet 3  . gabapentin (NEURONTIN) 300 MG capsule TAKE 2 CAPSULES BY MOUTH THREE TIMES DAILY 120 capsule 1  . HYDROcodone-acetaminophen (NORCO/VICODIN) 5-325 MG tablet Take 1-2 tablets by mouth every 4 (four) hours as needed for severe pain. 60 tablet 0  . HYDROcodone-acetaminophen (NORCO/VICODIN) 5-325 MG tablet Take 1 tablet by mouth every 6 (six) hours as needed for moderate pain. 30 tablet 0  . rizatriptan (MAXALT-MLT) 10 MG disintegrating tablet  Take 1 tablet earliest onset of migraine.  May repeat in 2 hours if needed.  Maximum 2 tablets in 24hrs 9 tablet 3  . venlafaxine XR (EFFEXOR XR) 37.5 MG 24 hr capsule 2 capsules qam 60 capsule 5   No current facility-administered medications for this visit.      Musculoskeletal: Strength & Muscle Tone: within normal limits Gait & Station: normal Patient leans: N/A  Psychiatric Specialty Exam: ROS  There were no vitals taken for this visit.There is no height or weight on file to calculate BMI.  General Appearance: Casual and Well Groomed  Eye Contact:  Good  Speech:  Clear and Coherent  Volume:  Normal  Mood:  Euthymic  Affect:  Congruent  Thought Process:  Goal Directed and Descriptions of Associations: Intact  Orientation:  Full (Time, Place, and Person)  Thought Content: Logical   Suicidal Thoughts:  No  Homicidal Thoughts:  No  Memory:  Immediate;   Fair  Judgement:  Fair  Insight:  Fair  Psychomotor Activity:  Normal  Concentration:  Concentration: Good  Recall:  Good  Fund of Knowledge: Good  Language: Good  Akathisia:  Negative  Handed:  Right  AIMS (if indicated): not done  Assets:  Communication Skills Desire for Improvement Housing Transportation  ADL's:  Intact  Cognition: WNL  Sleep:  Fair   Screenings: PHQ2-9     Office Visit from 09/21/2018 in [facility] Office Visit from 06/25/2018 in [facility] Office Visit from 06/18/2018 [facility] Office Visit from 08/28/2017 in [facility] Office Visit from 05/22/2017 in [facility]  PHQ-2 Total Score  1  0  0  0  0       Assessment and Plan:   At this time the patient is #1 problem seems to be panic disorder which is well treated with Effexor 37.5 mg XR.  Patient also takes Valium 10 mg 3 times daily which helps her panic as well.  Her second problem seems to be an adjustment disorder  with anxiety and  depression.  I believe the Valium helps this as well.  Also very importantly she is in therapy.  The patient is not suicidal.  She is functioning actually fairly well.  She is handling the pandemic fairly well as well.  Patient will continue on all these medications and return to see me in 3 months for 30-minute visit.   Status of current problems: gradually improving  Labs Ordered: No orders of the defined types were placed in this encounter.   Labs Reviewed: n/a  Collateral Obtained/Records Reviewed: n/a  Plan:  Continue Lexapro 20 mg daily Increase Seroquel to 200 mg nightly Return to clinic in 3-4 months, transfer care to Dr. Liz BeachPlovsky   Jefte Carithers I Osaze Hubbert, MD 03/01/2019, 9:47 AM

## 2019-03-01 NOTE — Telephone Encounter (Signed)
Please advise 

## 2019-03-03 ENCOUNTER — Emergency Department (HOSPITAL_COMMUNITY)
Admission: EM | Admit: 2019-03-03 | Discharge: 2019-03-03 | Disposition: A | Discharge status: Home / Self Care | Payer: Self-pay | Attending: Emergency Medicine | Admitting: Emergency Medicine

## 2019-03-03 ENCOUNTER — Encounter (HOSPITAL_COMMUNITY): Payer: Self-pay | Admitting: Emergency Medicine

## 2019-03-03 ENCOUNTER — Other Ambulatory Visit: Payer: Self-pay

## 2019-03-03 DIAGNOSIS — R1110 Vomiting, unspecified: Secondary | ICD-10-CM | POA: Insufficient documentation

## 2019-03-03 DIAGNOSIS — Z79899 Other long term (current) drug therapy: Secondary | ICD-10-CM | POA: Insufficient documentation

## 2019-03-03 DIAGNOSIS — G8918 Other acute postprocedural pain: Secondary | ICD-10-CM | POA: Insufficient documentation

## 2019-03-03 MED ORDER — HYDROCODONE-ACETAMINOPHEN 5-325 MG PO TABS
1.0000 | ORAL_TABLET | Freq: Once | ORAL | Status: AC
  Administered 2019-03-03: 10:00:00 1 via ORAL
  Filled 2019-03-03: qty 1

## 2019-03-03 MED ORDER — OXYCODONE HCL 5 MG PO TABS: 5.0000 mg | ORAL_TABLET | Freq: Four times a day (QID) | ORAL | 0 refills | Status: DC | PRN

## 2019-03-03 MED ORDER — LIDOCAINE 5 % EX PTCH
1.0000 | MEDICATED_PATCH | CUTANEOUS | Status: DC
  Administered 2019-03-03: 10:00:00 1 via TRANSDERMAL
  Filled 2019-03-03: qty 1

## 2019-03-03 MED ORDER — PROMETHAZINE HCL 25 MG RE SUPP: 25.0000 mg | Freq: Four times a day (QID) | RECTAL | 0 refills | Status: DC | PRN

## 2019-03-03 MED ORDER — PROMETHAZINE HCL 25 MG/ML IJ SOLN
25.0000 mg | Freq: Once | INTRAMUSCULAR | Status: AC
  Administered 2019-03-03: 10:00:00 25 mg via INTRAMUSCULAR
  Filled 2019-03-03: qty 1

## 2019-03-03 MED FILL — BUTALBITAL-APAP-CAFFEINE 50: 50-325-40 | 2 days supply | Qty: 10 | Fill #1

## 2019-03-03 NOTE — Discharge Instructions (Addendum)
Follow up with your orthopedic surgeon tomorrow. Pain medication provided in the ER today is only to get you to your appointment tomorrow and will not be refilled in the ER.  Phenergan as needed as prescribed for vomiting.

## 2019-03-03 NOTE — ED Provider Notes (Signed)
[facility] DEPT Provider Note   CSN: [id number] Arrival date & time: 03/03/19  8127    History   Chief Complaint Chief Complaint  Patient presents with  . Post-op Problem  . Knee Pain    HPI Sabrina [name] is a 53 y.o. female.     53 year old female presents with complaint of right knee pain.  Patient had right knee arthroscopic surgery on June 9 for torn meniscus.  Patient states since her surgery her pain has been getting progressively worse.  Patient was seen by ortho last week, had joint aspiration which shows no growth to date, had doppler and was negative for DVT. Patient was given additional pain medication 02/27/19 however states she has not been able to keep her medicine down and every time she takes a pill she vomits and then waits a few hours before trying again and is now out of all of her pain medication. Patient was seen in the emergency room yesterday for knee pain, had x-ray due to report of fall that caused her to hyperflex her knee, xray was normal, advised to take her home meds as prescribed and follow up with ortho. Patient returns today with on going pain, vomiting. Denies fevers, has been applying ice to her knee, no other complaints or concerns.      Past Medical History:  Diagnosis Date  . Arthritis    "knees" (09/06/2017)  . Bullous pemphigus   . Cat bite 06/2014   to left elbow  . History of blood transfusion 1988   "when I had my baby"  . Muscle weakness of lower extremity 2001; 09/05/2017   "resolved after a couple weeks; ?" (09/06/2017)  . 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[facility])   . Poisoning, snake bite 04/08/2016   "[place]; RUE"  . PONV (postoperative nausea and vomiting)   . Situational anxiety   . Staph infection ~ 2015   "left elbow and finger"    Patient Active Problem List   Diagnosis Date Noted  . Other meniscus derangements, posterior horn of medial meniscus, left knee 02/19/2019  . Bucket handle tear of  lateral meniscus 02/19/2019  . Unilateral primary osteoarthritis, right knee 12/13/2018  . Panic disorder 12/13/2018  . Chronic headaches 12/13/2018  . Weakness 09/06/2017  . Depression 09/06/2017  . 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  . Generalized anxiety disorder with panic attacks 09/06/2017  . Right hand pain   . Wrist swelling   . Cellulitis of right upper extremity 04/11/2016  . Elbow pain 07/20/2015  . Anxiety 07/20/2015  . Tachycardia 07/04/2015  . 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[facility])   . Skin ulcer of upper arm, limited to breakdown of skin ([facility]) 07/01/2015    Past Surgical History:  Procedure Laterality Date  . APPENDECTOMY  ~ 1987  . APPLICATION OF A-CELL OF EXTREMITY Left 08/05/2015   Procedure: APPLICATION OF A-CELL OF EXTREMITY;  Surgeon: Wallace Going, DO;  Location: [facility];  Service: Plastics;  Laterality: Left;  . BREAST SURGERY Right 1990   "milk duct taken out"  . CHONDROPLASTY Right 02/19/2019   Procedure: CHONDROPLASTY; EXCISION EXOSTOSIS;  Surgeon: Garald Balding, MD;  Location: [facility];  Service: Orthopedics;  Laterality: Right;  . DEBRIDEMENT AND CLOSURE WOUND Left 07/01/2015   Procedure: LEFT ELBOW EXCISION OF WOUND WITH PRIMARY CLOSURE 2X5 CM ;  Surgeon: Wallace Going, DO;  Location: [facility];  Service: Plastics;  Laterality: Left;  . ELBOW SURGERY Left X 23  in [place] <06/2015   from a cat bite; all I&D  . I&D EXTREMITY Left 07/08/2015   Procedure: IRRIGATION AND DEBRIDEMENT EXTREMITY, DRAINAGE OF LEFT ARM WOUND, A-CELL PLACEMENT, WOUND VAC PLACEMENT;  Surgeon: Alena Billslaire S Dillingham, DO;  Location: [facility];  Service: Plastics;  Laterality: Left;  . INCISION AND DRAINAGE OF WOUND Left 08/05/2015   Procedure: IRRIGATION AND DEBRIDEMENT LEFT ELBOW WOUND, PLACEMENT OF ACELL;  Surgeon: Peggye Formlaire S Dillingham, DO;  Location: [facility];  Service: Plastics;  Laterality: Left;  . KNEE ARTHROSCOPY WITH  MEDIAL MENISECTOMY Right 02/19/2019   Procedure: RIGHT KNEE ARTHROSCOPY, DEBRIDEMENT, PARTIAL MEDIAL AND LATERAL MENISECTOMY;  Surgeon: Valeria BatmanWhitfield, Peter W, MD;  Location: [facility];  Service: Orthopedics;  Laterality: Right;  . LAPAROSCOPIC CHOLECYSTECTOMY  1998  . SKIN GRAFT Left 2016   took from anterior thigh; placed at elbow  . TONSILLECTOMY  ~ 2000  . TOTAL ABDOMINAL HYSTERECTOMY  2003  . WRIST SURGERY Right 01/2016     OB History   No obstetric history on file.      Home Medications    Prior to Admission medications   Medication Sig Start Date End Date Taking? Authorizing Provider  butalbital-acetaminophen-caffeine (FIORICET) 50-325-40 MG tablet Take 1 tablet by mouth every 6 (six) hours as needed for headache. 02/26/19   Drema DallasJaffe, Adam R, DO  diazepam (VALIUM) 10 MG tablet Take 1 tablet (10 mg) twice a day for one week then, IF NEEDED, increase to 1 tablet (10 mg) in the morning and 2 tablets (20 mg) at bedtime 03/01/19   Plovsky, Earvin HansenGerald, MD  gabapentin (NEURONTIN) 300 MG capsule TAKE 2 CAPSULES BY MOUTH THREE TIMES DAILY 01/22/19   Philip AspenHernandez Acosta, Limmie PatriciaEstela Y, MD  HYDROcodone-acetaminophen (NORCO/VICODIN) 5-325 MG tablet Take 1-2 tablets by mouth every 4 (four) hours as needed for severe pain. 02/19/19   Jetty PeeksPetrarca, Brian D, PA-C  HYDROcodone-acetaminophen (NORCO/VICODIN) 5-325 MG tablet Take 1 tablet by mouth every 6 (six) hours as needed for moderate pain. 02/27/19   Valeria BatmanWhitfield, Peter W, MD  oxyCODONE (ROXICODONE) 5 MG immediate release tablet Take 1 tablet (5 mg total) by mouth every 6 (six) hours as needed for severe pain. 03/03/19   Jeannie FendMurphy, Britainy Kozub A, PA-C  promethazine (PHENERGAN) 25 MG suppository Place 1 suppository (25 mg total) rectally every 6 (six) hours as needed for nausea or vomiting. 03/03/19   Jeannie FendMurphy, Asani Deniston A, PA-C  rizatriptan (MAXALT-MLT) 10 MG disintegrating tablet Take 1 tablet earliest onset of migraine.  May repeat in 2 hours if needed.  Maximum 2 tablets in  24hrs 01/23/19   Drema DallasJaffe, Adam R, DO  venlafaxine XR (EFFEXOR XR) 37.5 MG 24 hr capsule 2 capsules qam 03/01/19   Archer AsaPlovsky, Gerald, MD    Family History Family History  Problem Relation Age of Onset  . Liver disease Mother   . Dementia Mother   . Cirrhosis Mother   . Prostate cancer Father   . Colon cancer Maternal Grandmother   . Ovarian cancer Maternal Aunt     Social History Social History   Tobacco Use  . Smoking status: Never Smoker  . Smokeless tobacco: Never Used  Substance Use Topics  . Alcohol use: Yes    Alcohol/week: 0.0 standard drinks    Comment: social  . Drug use: No     Allergies   Zofran [ondansetron hcl], Droperidol, Flexeril [cyclobenzaprine], Morphine and related, Tessalon [benzonatate], and Voltaren [diclofenac sodium]   Review of Systems Review of Systems  Constitutional: Negative for  fever.  Gastrointestinal: Positive for nausea and vomiting. Negative for abdominal pain and blood in stool.  Musculoskeletal: Positive for arthralgias, joint swelling and myalgias.  Skin: Negative for color change, rash and wound.  Allergic/Immunologic: Negative for immunocompromised state.  Neurological: Negative for weakness.  All other systems reviewed and are negative.    Physical Exam Updated Vital Signs BP 104/76   Pulse 89   Temp 98.9 F (37.2 C) (Oral)   Resp 18   SpO2 97%   Physical Exam Vitals signs and nursing note reviewed.  Constitutional:      General: She is not in acute distress.    Appearance: Normal appearance. She is well-developed. She is obese. She is not diaphoretic.  HENT:     Head: Normocephalic and atraumatic.  Cardiovascular:     Pulses: Normal pulses.  Pulmonary:     Effort: Pulmonary effort is normal.  Musculoskeletal:        General: Swelling and tenderness present.     Right lower leg: No edema.     Left lower leg: No edema.       Legs:  Skin:    General: Skin is warm and dry.     Capillary Refill: Capillary refill  takes less than 2 seconds.     Findings: No erythema or rash.  Neurological:     General: No focal deficit present.     Mental Status: She is alert and oriented to person, place, and time.  Psychiatric:        Behavior: Behavior normal.      ED Treatments / Results  Labs (all labs ordered are listed, but only abnormal results are displayed) Labs Reviewed - No data to display  EKG None  Radiology Dg Knee Complete 4 Views Right  Result Date: 03/01/2019 CLINICAL DATA:  Recent knee surgery (02/19/2019).  Fell today. EXAM: RIGHT KNEE - COMPLETE 4+ VIEW COMPARISON:  09/22/2018 FINDINGS: Stable age advanced tricompartmental degenerative changes with joint space narrowing and osteophytic spurring. No acute fracture or osteochondral lesion. There is a moderate-sized knee joint effusion. 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 No acute fracture is identified. Moderate to large joint effusion. Electronically Signed   By: Rudie MeyerP.  Gallerani M.D.   On: 03/01/2019 14:28    Procedures Procedures (including critical care time)  Medications Ordered in ED Medications  lidocaine (LIDODERM) 5 % 1 patch (1 patch Transdermal Patch Applied 03/03/19 1013)  promethazine (PHENERGAN) injection 25 mg (25 mg Intramuscular Given 03/03/19 1013)  HYDROcodone-acetaminophen (NORCO/VICODIN) 5-325 MG per tablet 1 tablet (1 tablet Oral Given 03/03/19 1013)     Initial Impression / Assessment and Plan / ED Course  I have reviewed the triage vital signs and the nursing notes.  Pertinent labs & imaging results that were available during my care of the patient were reviewed by me and considered in my medical decision making (see chart for details).  Clinical Course as of Mar 03 1211  Sun Mar 03, 2019  [id number] 53 year old female with right knee pain post surgery earlier this month.  Patient states that she has not been able to keep any of her pain medication down for the past 3 to 4  days.  Reviewed patient's records in the emergency room yesterday, negative x-ray, review of patient's aspiration report, no growth to date.  Wounds appear to be healing well, there is no evidence of postop infection, compartments are soft, neurovascular intact.  Lidoderm patch applied to medial knee as this is patient's  location of greatest discomfort, she was given injection of Phenergan (reports history of anaphylaxis with Zofran) and Norco p.o.  Review of controlled substance database shows patient has received multiple prescriptions for Norco this month, concern for ongoing use of Tylenol, patient was given prescription for 5 mg oxycodone to last until tomorrow when she can reach out to her orthopedist for further management.  Patient was also given prescription for Phenergan PR to use if she continues to have vomiting.  She is agreeable with discharge instructions and treatment plan.   [LM]    Clinical Course User Index [LM] Jeannie FendMurphy, Aarit Kashuba A, PA-C      Final Clinical Impressions(s) / ED Diagnoses   Final diagnoses:  Post-operative pain    ED Discharge Orders         Ordered    oxyCODONE (ROXICODONE) 5 MG immediate release tablet  Every 6 hours PRN     03/03/19 0959    promethazine (PHENERGAN) 25 MG suppository  Every 6 hours PRN     03/03/19 0959           Jeannie FendMurphy, Emerson Schreifels A, PA-C 03/03/19 1212    Gerhard MunchLockwood, Robert, MD 03/09/19 2324

## 2019-03-03 NOTE — ED Triage Notes (Signed)
Pt reports had constant pain in right knee since her surgery. Reports vomiting and unable to keep medications down, so cant sleep or eat. Tried staying off of it, elevating it, ice.

## 2019-03-04 ENCOUNTER — Ambulatory Visit (INDEPENDENT_AMBULATORY_CARE_PROVIDER_SITE_OTHER): Payer: Self-pay | Admitting: Licensed Clinical Social Worker

## 2019-03-04 DIAGNOSIS — F410 Panic disorder [episodic paroxysmal anxiety] without agoraphobia: Secondary | ICD-10-CM

## 2019-03-05 ENCOUNTER — Other Ambulatory Visit: Payer: Self-pay

## 2019-03-05 ENCOUNTER — Ambulatory Visit (INDEPENDENT_AMBULATORY_CARE_PROVIDER_SITE_OTHER): Payer: No Typology Code available for payment source | Admitting: Orthopaedic Surgery

## 2019-03-05 ENCOUNTER — Encounter: Payer: Self-pay | Admitting: Orthopaedic Surgery

## 2019-03-05 VITALS — BP 102/68 | Ht 64.0 in | Wt 190.0 lb

## 2019-03-05 DIAGNOSIS — M23322 Other meniscus derangements, posterior horn of medial meniscus, left knee: Secondary | ICD-10-CM

## 2019-03-05 DIAGNOSIS — G90529 Complex regional pain syndrome I of unspecified lower limb: Secondary | ICD-10-CM | POA: Insufficient documentation

## 2019-03-05 DIAGNOSIS — M1711 Unilateral primary osteoarthritis, right knee: Secondary | ICD-10-CM

## 2019-03-05 DIAGNOSIS — G8929 Other chronic pain: Secondary | ICD-10-CM

## 2019-03-05 DIAGNOSIS — M25561 Pain in right knee: Secondary | ICD-10-CM

## 2019-03-05 DIAGNOSIS — G90521 Complex regional pain syndrome I of right lower limb: Secondary | ICD-10-CM

## 2019-03-05 LAB — ANAEROBIC AND AEROBIC CULTURE
AER RESULT:: NO GROWTH
MICRO NUMBER:: 580651
MICRO NUMBER:: 580747
SPECIMEN QUALITY:: ADEQUATE
SPECIMEN QUALITY:: ADEQUATE

## 2019-03-05 MED ORDER — OXYCODONE HCL 5 MG PO CAPS: 5.0000 mg | ORAL_CAPSULE | ORAL | 0 refills | Status: DC | PRN

## 2019-03-05 MED FILL — oxyCODONE HCL 5 MG TABS: 5 | 5 days supply | Qty: 30 | Fill #0

## 2019-03-05 NOTE — Progress Notes (Signed)
Office Visit Note   Patient: Sabrina [name]           Date of Birth: [birth year]           MRN: [id number] Visit Date: 03/05/2019              Requested by: Isaac Bliss, Rayford Halsted, MD [place],  [place] [address] PCP: Isaac Bliss, Rayford Halsted, MD   Assessment & Plan: Visit Diagnoses:  1. Chronic pain of right knee   2. Unilateral primary osteoarthritis, right knee   3. Other meniscus derangements, posterior horn of medial meniscus, left knee   4. Complex regional pain syndrome type 1 of right lower extremity     Plan:  #1: She can start to wean off her crutches #2: OxyIR 1 tablet every 4-6 hours as needed for pain #3: She will take a 500 mg Tylenol 1 every 4 hours for 2 every 8 hours as needed for pain #4: Continue use of ice  Follow-Up Instructions: Return in about 3 weeks (around 03/26/2019).   Orders:  Orders Placed This Encounter  Procedures  . Ambulatory referral to Physical Therapy   Meds ordered this encounter  Medications  . oxycodone (OXY-IR) 5 MG capsule    Sig: Take 1 capsule (5 mg total) by mouth every 4 (four) hours as needed.    Dispense:  30 capsule    Refill:  0    Order Specific Question:   Supervising Provider    Answer:   Garald Balding [[id number]]      Procedures: No procedures performed   Clinical Data: No additional findings.   Subjective: Chief Complaint  Patient presents with  . Right Knee - Routine Post Op    Right knee scope DOS 02/19/2019   HPI Patient presents today now two weeks out from surgery. She had a right knee scope with medial and lateral meniscectomies, and chondroplasty of the medial and lateral compartments on 02/19/2019. Patient states that she fell Friday 03/01/2019 and went to the ER. She then went two days later to the ER for her pain. She is walking with crutches and unable to bear weight. Patient states that she has nothing to take for pain. She is taking Tylenol for pain.  She has used Voltaren  gel which causes her skin to become red.  She is unable to take NSAIDs because it upsets her stomach caused burning.     Objective: Vital Signs: BP 102/68   Ht 5\' 4"  (1.626 m)   Wt 190 lb (86.2 kg)   BMI 32.61 kg/m   Physical Exam Constitutional:      Appearance: She is well-developed.  Eyes:     Pupils: Pupils are equal, round, and reactive to light.  Pulmonary:     Effort: Pulmonary effort is normal.  Skin:    General: Skin is warm and dry.  Neurological:     Mental Status: She is alert and oriented to person, place, and time.  Psychiatric:        Behavior: Behavior normal.     Ortho Exam  Exam today reveals some swelling about the right knee.  Her wounds are healing per primam with no signs of infection.  She does have tenderness over the entire knee more medial than lateral.  She can fully extend the knee.  She actually has flexion to about 100 105 degrees.  She sat with the knee bent at least 90 degrees during the part of  the discussion of her office visit.  She is ligamentously stable.  Calf is supple and nontender.  Sensation is intact light touch.  Specialty Comments:  No specialty comments available.  Imaging: No results found.   PMFS History: Current Outpatient Medications  Medication Sig Dispense Refill  . butalbital-acetaminophen-caffeine (FIORICET) 50-325-40 MG tablet Take 1 tablet by mouth every 6 (six) hours as needed for headache. 10 tablet 3  . diazepam (VALIUM) 10 MG tablet Take 1 tablet (10 mg) twice a day for one week then, IF NEEDED, increase to 1 tablet (10 mg) in the morning and 2 tablets (20 mg) at bedtime 90 tablet 3  . gabapentin (NEURONTIN) 300 MG capsule TAKE 2 CAPSULES BY MOUTH THREE TIMES DAILY 120 capsule 1  . HYDROcodone-acetaminophen (NORCO/VICODIN) 5-325 MG tablet Take 1 tablet by mouth every 6 (six) hours as needed for moderate pain. 30 tablet 0  . promethazine (PHENERGAN) 25 MG suppository Place 1 suppository (25 mg total) rectally  every 6 (six) hours as needed for nausea or vomiting. 6 each 0  . rizatriptan (MAXALT-MLT) 10 MG disintegrating tablet Take 1 tablet earliest onset of migraine.  May repeat in 2 hours if needed.  Maximum 2 tablets in 24hrs 9 tablet 3  . venlafaxine XR (EFFEXOR XR) 37.5 MG 24 hr capsule 2 capsules qam 60 capsule 5  . HYDROcodone-acetaminophen (NORCO/VICODIN) 5-325 MG tablet Take 1-2 tablets by mouth every 4 (four) hours as needed for severe pain. (Patient not taking: Reported on 03/05/2019) 60 tablet 0  . oxycodone (OXY-IR) 5 MG capsule Take 1 capsule (5 mg total) by mouth every 4 (four) hours as needed. 30 capsule 0  . oxyCODONE (ROXICODONE) 5 MG immediate release tablet Take 1 tablet (5 mg total) by mouth every 6 (six) hours as needed for severe pain. (Patient not taking: Reported on 03/05/2019) 5 tablet 0   No current facility-administered medications for this visit.     Patient Active Problem List   Diagnosis Date Noted  . 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 Other meniscus derangements, posterior horn of medial meniscus, left knee 02/19/2019  . Bucket handle tear of lateral meniscus 02/19/2019  . Unilateral primary osteoarthritis, right knee 12/13/2018  . Panic disorder 12/13/2018  . Chronic headaches 12/13/2018  . Weakness 09/06/2017  . Depression 09/06/2017  . 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  . Generalized anxiety disorder with panic attacks 09/06/2017  . Right hand pain   . Wrist swelling   . Cellulitis of right upper extremity 04/11/2016  . Elbow pain 07/20/2015  . Anxiety 07/20/2015  . Tachycardia 07/04/2015  . 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HCC)   . Skin ulcer of upper arm, limited to breakdown of skin (HCC) 07/01/2015   Past Medical History:  Diagnosis Date  . Arthritis    "knees" (09/06/2017)  . Bullous pemphigus   . Cat bite 06/2014   to left elbow  . History of blood transfusion 1988   "when I had my baby"  . Muscle weakness of lower extremity 2001; 09/05/2017   "resolved after a  couple weeks; ?" (09/06/2017)  . 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 Poisoning, snake bite 04/08/2016   "[place]; RUE"  . PONV (postoperative nausea and vomiting)   . Situational anxiety   . Staph infection ~ 2015   "left elbow and finger"    Family History  Problem Relation Age of Onset  . Liver disease Mother   . Dementia Mother   . Cirrhosis Mother   . Prostate cancer Father   .  Colon cancer Maternal Grandmother   . Ovarian cancer Maternal Aunt     Past Surgical History:  Procedure Laterality Date  . APPENDECTOMY  ~ 1987  . APPLICATION OF A-CELL OF EXTREMITY Left 08/05/2015   Procedure: APPLICATION OF A-CELL OF EXTREMITY;  Surgeon: Peggye Formlaire S Dillingham, DO;  Location: [facility];  Service: Plastics;  Laterality: Left;  . BREAST SURGERY Right 1990   "milk duct taken out"  . CHONDROPLASTY Right 02/19/2019   Procedure: CHONDROPLASTY; EXCISION EXOSTOSIS;  Surgeon: Valeria BatmanWhitfield, Peter W, MD;  Location: [facility];  Service: Orthopedics;  Laterality: Right;  . DEBRIDEMENT AND CLOSURE WOUND Left 07/01/2015   Procedure: LEFT ELBOW EXCISION OF WOUND WITH PRIMARY CLOSURE 2X5 CM ;  Surgeon: Peggye Formlaire S Dillingham, DO;  Location: [facility];  Service: Plastics;  Laterality: Left;  . ELBOW SURGERY Left X 23 in [place] <06/2015   from a cat bite; all I&D  . I&D EXTREMITY Left 07/08/2015   Procedure: IRRIGATION AND DEBRIDEMENT EXTREMITY, DRAINAGE OF LEFT ARM WOUND, A-CELL PLACEMENT, WOUND VAC PLACEMENT;  Surgeon: Alena Billslaire S Dillingham, DO;  Location: [facility];  Service: Plastics;  Laterality: Left;  . INCISION AND DRAINAGE OF WOUND Left 08/05/2015   Procedure: IRRIGATION AND DEBRIDEMENT LEFT ELBOW WOUND, PLACEMENT OF ACELL;  Surgeon: Peggye Formlaire S Dillingham, DO;  Location: [facility];  Service: Plastics;  Laterality: Left;  . KNEE ARTHROSCOPY WITH MEDIAL MENISECTOMY Right 02/19/2019   Procedure: RIGHT KNEE ARTHROSCOPY, DEBRIDEMENT, PARTIAL MEDIAL AND  LATERAL MENISECTOMY;  Surgeon: Valeria BatmanWhitfield, Peter W, MD;  Location: [facility];  Service: Orthopedics;  Laterality: Right;  . LAPAROSCOPIC CHOLECYSTECTOMY  1998  . SKIN GRAFT Left 2016   took from anterior thigh; placed at elbow  . TONSILLECTOMY  ~ 2000  . TOTAL ABDOMINAL HYSTERECTOMY  2003  . WRIST SURGERY Right 01/2016   Social History   Occupational History  . Occupation: unemployed  Tobacco Use  . Smoking status: Never Smoker  . Smokeless tobacco: Never Used  Substance and Sexual Activity  . Alcohol use: Yes    Alcohol/week: 0.0 standard drinks    Comment: social  . Drug use: No  . Sexual activity: Not Currently

## 2019-03-05 NOTE — Progress Notes (Signed)
No show

## 2019-03-07 ENCOUNTER — Ambulatory Visit: Payer: Self-pay | Admitting: Physical Therapy

## 2019-03-07 MED FILL — GABAPENTIN 300 MG CAPSULE: 300 | 20 days supply | Qty: 120 | Fill #1

## 2019-03-08 MED FILL — BUTALBITAL-APAP-CAFFEINE 50: 50-325-40 | 2 days supply | Qty: 10 | Fill #2

## 2019-03-11 ENCOUNTER — Telehealth: Payer: Self-pay | Admitting: Orthopaedic Surgery

## 2019-03-11 ENCOUNTER — Ambulatory Visit: Payer: Self-pay | Attending: Orthopaedic Surgery | Admitting: Physical Therapy

## 2019-03-11 ENCOUNTER — Encounter: Payer: Self-pay | Admitting: Physical Therapy

## 2019-03-11 ENCOUNTER — Other Ambulatory Visit: Payer: Self-pay

## 2019-03-11 ENCOUNTER — Other Ambulatory Visit: Payer: Self-pay | Admitting: Orthopedic Surgery

## 2019-03-11 DIAGNOSIS — R262 Difficulty in walking, not elsewhere classified: Secondary | ICD-10-CM | POA: Insufficient documentation

## 2019-03-11 DIAGNOSIS — M25561 Pain in right knee: Secondary | ICD-10-CM | POA: Insufficient documentation

## 2019-03-11 MED FILL — oxyCODONE HCL 5 MG TABS: 5 | 7 days supply | Qty: 30 | Fill #0

## 2019-03-11 NOTE — Therapy (Signed)
[facility] [address] [place], [place], [address] Phone: [phone]   Fax:  [phone]  Physical Therapy Evaluation  Patient Details  Name: Claybon JabsDana L Nair MRN: [id number] Date of Birth: 02/15/1966 Referring Provider (PT): Valeria BatmanWhitfield, Peter W, MD   Encounter Date: 03/11/2019  PT End of Session - 03/11/19 1639    Visit Number  1    Number of Visits  13    Date for PT Re-Evaluation  04/26/19    Authorization Type  CAFA    PT Start Time  1550    PT Stop Time  1639    PT Time Calculation (min)  49 min    Activity Tolerance  Patient limited by pain    Behavior During Therapy  Anxious       Past Medical History:  Diagnosis Date  . Arthritis    "knees" (09/06/2017)  . Bullous pemphigus   . Cat bite 06/2014   to left elbow  . History of blood transfusion 1988   "when I had my baby"  . Muscle weakness of lower extremity 2001; 09/05/2017   "resolved after a couple weeks; ?" (09/06/2017)  . 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 Poisoning, snake bite 04/08/2016   "[place]; RUE"  . PONV (postoperative nausea and vomiting)   . Situational anxiety   . Staph infection ~ 2015   "left elbow and finger"    Past Surgical History:  Procedure Laterality Date  . APPENDECTOMY  ~ 1987  . APPLICATION OF A-CELL OF EXTREMITY Left 08/05/2015   Procedure: APPLICATION OF A-CELL OF EXTREMITY;  Surgeon: Peggye Formlaire S Dillingham, DO;  Location: [facility];  Service: Plastics;  Laterality: Left;  . BREAST SURGERY Right 1990   "milk duct taken out"  . CHONDROPLASTY Right 02/19/2019   Procedure: CHONDROPLASTY; EXCISION EXOSTOSIS;  Surgeon: Valeria BatmanWhitfield, Peter W, MD;  Location: [facility];  Service: Orthopedics;  Laterality: Right;  . DEBRIDEMENT AND CLOSURE WOUND Left 07/01/2015   Procedure: LEFT ELBOW EXCISION OF WOUND WITH PRIMARY CLOSURE 2X5 CM ;  Surgeon: Peggye Formlaire S Dillingham, DO;  Location: [facility];   Service: Plastics;  Laterality: Left;  . ELBOW SURGERY Left X 23 in [place] <06/2015   from a cat bite; all I&D  . I&D EXTREMITY Left 07/08/2015   Procedure: IRRIGATION AND DEBRIDEMENT EXTREMITY, DRAINAGE OF LEFT ARM WOUND, A-CELL PLACEMENT, WOUND VAC PLACEMENT;  Surgeon: Alena Billslaire S Dillingham, DO;  Location: [facility];  Service: Plastics;  Laterality: Left;  . INCISION AND DRAINAGE OF WOUND Left 08/05/2015   Procedure: IRRIGATION AND DEBRIDEMENT LEFT ELBOW WOUND, PLACEMENT OF ACELL;  Surgeon: Peggye Formlaire S Dillingham, DO;  Location: [facility];  Service: Plastics;  Laterality: Left;  . KNEE ARTHROSCOPY WITH MEDIAL MENISECTOMY Right 02/19/2019   Procedure: RIGHT KNEE ARTHROSCOPY, DEBRIDEMENT, PARTIAL MEDIAL AND LATERAL MENISECTOMY;  Surgeon: Valeria BatmanWhitfield, Peter W, MD;  Location: [facility];  Service: Orthopedics;  Laterality: Right;  . LAPAROSCOPIC CHOLECYSTECTOMY  1998  . SKIN GRAFT Left 2016   took from anterior thigh; placed at elbow  . TONSILLECTOMY  ~ 2000  . TOTAL ABDOMINAL HYSTERECTOMY  2003  . WRIST SURGERY Right 01/2016    There were no vitals filed for this visit.   Subjective Assessment - 03/11/19 1559    Subjective  When I was younger, my horse and I fell which started knee problem. Both knees are bone on bone. I have had to stop working recently due  to knee pain, I want to ride my horses. I wish I had done a knee replacement instead of the scope.    How long can you stand comfortably?  about 2 hr before surgery    Patient Stated Goals  stairs, improve strength, get back to work, ride horses.    Currently in Pain?  Yes    Pain Location  Knee    Pain Orientation  Right;Left    Pain Descriptors / Indicators  Aching   bone-on-bone   Aggravating Factors   weight bearing    Pain Relieving Factors  rest, ice, pain meds         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   Vocation Requirements  vet tech      Cognition   Overall Cognitive Status  Within Functional Limits for tasks assessed      Sensation   Additional Comments  WFL      ROM / Strength   AROM / PROM / Strength  AROM      AROM   Overall AROM Comments  lacking extension in Rt knee, flexion approx 100      Palpation   Palpation comment  bilateral medial knee line TTP      Ambulation/Gait   Gait Comments  lacking knee ext at heel strike on Rt, antalgic bilaterally, flexed at waist, ipsilateral trunk lean in weight bearing                Objective measurements completed on examination: See above findings.      [facility] Adult PT Treatment/Exercise - 03/11/19 0001      Exercises   Exercises  Knee/Hip      Knee/Hip Exercises: Aerobic   Recumbent Bike  6 min L1, slow pace             PT Education - 03/11/19 2138    Education Details  anatomy of condition, POC, HEP, exercise form/rationale, shoes, TKA & age    Person(s) Educated  Patient    Methods  Explanation    Comprehension  Verbalized understanding;Need further instruction       PT Short Term Goals - 03/11/19 2147      PT SHORT TERM GOAL #1   Title  Pt will demo full knee extension at heel strike    Baseline  not fully extended at eval    Time  3    Period  Weeks    Status  New    Target Date  04/01/19      PT SHORT TERM GOAL #2   Title  Pt will verbalize complaince to ice and elevation at least 2/day    Baseline  encouraged 3/day at eval     Time  3    Period  Weeks    Status  New    Target Date  04/01/19      PT SHORT TERM GOAL #3   Title  pt will be independent in short term HEP    Baseline  will progress as appropriate    Time  3    Period  Weeks    Status  New    Target Date  04/01/19        PT Long Term Goals - 03/11/19 2148      PT LONG TERM GOAL #1   Title  Pt will be able to tolerate 30 min of gait for functional community ambulation    Baseline  unable at eval    Time  6    Period  Weeks    Status  New    Target Date  04/26/19      PT LONG TERM GOAL #2   Title  Pt will tolerate mounting and riding a horse at a slow pace    Baseline  unable at eval    Time  6    Period  Weeks    Status  New    Target Date  04/26/19      PT LONG TERM GOAL #3   Title  Pt will demo proper gait pattern along biomechanical chain    Baseline  antalgic at eval, lackingknee ext    Time  6    Period  Weeks    Status  New    Target Date  04/26/19      PT LONG TERM GOAL #4   Title  pt will navigate stairs step-over-step    Baseline  unable at eval    Time  6    Period  Weeks    Status  New    Target Date  04/26/19             Plan - 03/11/19 2139    Clinical Impression Statement  Pt presents to PT 3 weeks s/p Rt knee scope and partial menisectomy. Pt has been dealing with knee pain for many years and is now frustrated as she wishes she would have done knee replacements instead. We discussed the positive and negatives to both surgeries and discussd her goals. Pt will look into purchasing an upright bike for exercise as she would like to lose weight and recumbant bike requires too much knee flexion and there is a lot of griding. Antalgic gait demonstrated affecting bilateral knees. Pt will benefit from skilled PT in order to improve strength and function to reach long term goals.    Personal Factors and Comorbidities  Time since onset of injury/illness/exacerbation;Comorbidity 1    Comorbidities  arthritis     Examination-Activity Limitations  Bed Mobility;Bend;Sit;Sleep;Squat;Stairs;Stand;Transfers;Locomotion Level    Examination-Participation Restrictions  Cleaning;Community Activity;Shop;Other   animal care, work activities   Stability/Clinical Decision Making  Evolving/Moderate complexity    Clinical Decision Making  Moderate    Rehab Potential  Good    PT Frequency  2x / week    PT Duration  6 weeks    PT Treatment/Interventions  ADLs/Self Care Home Management;Cryotherapy;Electrical Stimulation;Gait training;Ultrasound;Moist Heat;Iontophoresis 4mg /ml Dexamethasone;Stair training;Functional mobility training;Therapeutic activities;Therapeutic exercise;Balance training;Neuromuscular re-education;Manual techniques;Patient/family education;Scar mobilization;Passive range of motion;Dry needling;Taping;Vasopneumatic Device    PT Next Visit Plan  extension ROM, gastroc stretch, OKC quad strength, hip abd strength    PT Home Exercise Plan  bike,  shoes, gait pattern    Consulted and Agree with Plan of Care  Patient       Patient will benefit from skilled therapeutic intervention in order to improve the following deficits and impairments:  Abnormal gait, Decreased range of motion, Difficulty walking, Increased muscle spasms, Decreased endurance, Decreased activity tolerance, Pain, Improper body mechanics, Impaired flexibility, Decreased balance, Decreased strength, Increased edema, Postural dysfunction  Visit Diagnosis: 1. Acute pain of right knee   2. Difficulty in walking, not elsewhere classified        Problem List Patient Active Problem List   Diagnosis Date Noted  . 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 Other meniscus derangements, posterior horn of medial meniscus, left knee 02/19/2019  . Bucket handle tear of lateral meniscus 02/19/2019  . Unilateral primary osteoarthritis, right knee 12/13/2018  . Panic disorder 12/13/2018  . Chronic headaches 12/13/2018  . Weakness 09/06/2017  . Depression  09/06/2017  . 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  . Generalized anxiety disorder with panic attacks 09/06/2017  . Right hand pain   . Wrist swelling   . Cellulitis of right upper extremity 04/11/2016  . Elbow pain 07/20/2015  . Anxiety 07/20/2015  . Tachycardia 07/04/2015  . 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HCC)   . Skin ulcer of upper arm, limited to breakdown of skin (HCC) 07/01/2015   Phiona Ramnauth C. Kavina Cantave PT, DPT 03/11/19 9:55 PM   [facility] [address] [place], [place], [address] Phone: [phone]   Fax:  [phone]  Name: Claybon JabsDana L Welty MRN: [id number] Date of Birth: 08/17/1966

## 2019-03-11 NOTE — Telephone Encounter (Signed)
Patient called to let Aaron Edelman know the reason she called the pharmacy to request a prescription refill of Oxycodone is because she begins outpatient physical therapy this afternoon.

## 2019-03-12 NOTE — Telephone Encounter (Signed)
Sent 6/29

## 2019-03-12 NOTE — Telephone Encounter (Signed)
Please call patient. Thank you.  

## 2019-03-12 NOTE — Telephone Encounter (Signed)
Spoke with patient. She is aware.

## 2019-03-12 NOTE — Telephone Encounter (Signed)
FYI

## 2019-03-15 ENCOUNTER — Encounter

## 2019-03-15 MED FILL — BUTALBITAL-APAP-CAFFEINE 50: 50-325-40 | 2 days supply | Qty: 10 | Fill #3

## 2019-03-18 ENCOUNTER — Ambulatory Visit: Payer: Self-pay | Admitting: Physical Therapy

## 2019-03-19 ENCOUNTER — Ambulatory Visit: Payer: Self-pay | Attending: Orthopaedic Surgery | Admitting: Physical Therapy

## 2019-03-19 ENCOUNTER — Telehealth: Payer: Self-pay | Admitting: Internal Medicine

## 2019-03-19 ENCOUNTER — Encounter: Payer: Self-pay | Admitting: Physical Therapy

## 2019-03-19 ENCOUNTER — Other Ambulatory Visit: Payer: Self-pay

## 2019-03-19 DIAGNOSIS — R262 Difficulty in walking, not elsewhere classified: Secondary | ICD-10-CM | POA: Insufficient documentation

## 2019-03-19 DIAGNOSIS — G629 Polyneuropathy, unspecified: Secondary | ICD-10-CM

## 2019-03-19 DIAGNOSIS — M25561 Pain in right knee: Secondary | ICD-10-CM | POA: Insufficient documentation

## 2019-03-19 MED ORDER — GABAPENTIN 300 MG PO CAPS: 600.0000 mg | ORAL_CAPSULE | Freq: Three times a day (TID) | ORAL | 0 refills | Status: DC

## 2019-03-19 MED FILL — GABAPENTIN 300 MG CAPSULE: 300 | 20 days supply | Qty: 120 | Fill #2

## 2019-03-19 NOTE — Telephone Encounter (Signed)
Copied from [place] [id number]. Topic: General - Other >> Mar 19, 2019  1:46 PM Mcneil, Ja-Kwan wrote: Reason for CRM: Pt stated she will be going to [place] because her father has been in the hospital since May and they were contacted and told that they should come see him as it is uncertain how much longer he has. Pt stated she does not know how long she will be there but she would like to ask if the Rx for gabapentin (NEURONTIN) 300 MG capsule can be filled a few days early so she does not run out while out of town. Pt requests call back.

## 2019-03-19 NOTE — Telephone Encounter (Signed)
Ok to refill for 7 days

## 2019-03-19 NOTE — Therapy (Signed)
[place], [place], [address] Phone: [phone]   Fax:  [phone]  Physical Therapy Treatment  Patient Details  Name: Sabrina [name] MRN: [id number] Date of Birth: 02/01/1966 Referring Provider (PT): Garald Balding, MD   Encounter Date: 03/19/2019  PT End of Session - 03/19/19 2103    Visit Number  2    Number of Visits  13    Date for PT Re-Evaluation  04/26/19    Authorization Type  CAFA    PT Start Time  1630    PT Stop Time  1711    PT Time Calculation (min)  41 min    Activity Tolerance  Patient tolerated treatment well    Behavior During Therapy  [facility] for tasks assessed/performed       Past Medical History:  Diagnosis Date  . Arthritis    "knees" (09/06/2017)  . Bullous pemphigus   . Cat bite 06/2014   to left elbow  . History of blood transfusion 1988   "when I had my baby"  . Muscle weakness of lower extremity 2001; 09/05/2017   "resolved after a couple weeks; ?" (09/06/2017)  . 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[place])   . Poisoning, snake bite 04/08/2016   "[place]; RUE"  . PONV (postoperative nausea and vomiting)   . Situational anxiety   . Staph infection ~ 2015   "left elbow and finger"    Past Surgical History:  Procedure Laterality Date  . APPENDECTOMY  ~ 1987  . APPLICATION OF A-CELL OF EXTREMITY Left 08/05/2015   Procedure: APPLICATION OF A-CELL OF EXTREMITY;  Surgeon: Wallace Going, DO;  Location: [place];  Service: Plastics;  Laterality: Left;  . BREAST SURGERY Right 1990   "milk duct taken out"  . CHONDROPLASTY Right 02/19/2019   Procedure: CHONDROPLASTY; EXCISION EXOSTOSIS;  Surgeon: Garald Balding, MD;  Location: [place];  Service: Orthopedics;  Laterality: Right;  . DEBRIDEMENT AND CLOSURE WOUND Left 07/01/2015   Procedure: LEFT ELBOW EXCISION OF WOUND WITH PRIMARY CLOSURE 2X5 CM ;  Surgeon: Wallace Going, DO;  Location:  [place];  Service: Plastics;  Laterality: Left;  . ELBOW SURGERY Left X 23 in [place] <06/2015   from a cat bite; all I&D  . I&D EXTREMITY Left 07/08/2015   Procedure: IRRIGATION AND DEBRIDEMENT EXTREMITY, DRAINAGE OF LEFT ARM WOUND, A-CELL PLACEMENT, WOUND VAC PLACEMENT;  Surgeon: Loel Lofty Dillingham, DO;  Location: [facility];  Service: Plastics;  Laterality: Left;  . INCISION AND DRAINAGE OF WOUND Left 08/05/2015   Procedure: IRRIGATION AND DEBRIDEMENT LEFT ELBOW WOUND, PLACEMENT OF ACELL;  Surgeon: Wallace Going, DO;  Location: [facility];  Service: Plastics;  Laterality: Left;  . KNEE ARTHROSCOPY WITH MEDIAL MENISECTOMY Right 02/19/2019   Procedure: RIGHT KNEE ARTHROSCOPY, DEBRIDEMENT, PARTIAL MEDIAL AND LATERAL MENISECTOMY;  Surgeon: Garald Balding, MD;  Location: [facility];  Service: Orthopedics;  Laterality: Right;  . LAPAROSCOPIC CHOLECYSTECTOMY  1998  . SKIN GRAFT Left 2016   took from anterior thigh; placed at elbow  . TONSILLECTOMY  ~ 2000  . TOTAL ABDOMINAL HYSTERECTOMY  2003  . WRIST SURGERY Right 01/2016    There were no vitals filed for this visit.  Subjective Assessment - 03/19/19 1640    Subjective  Patient cotninues to have pain. She has been able to go up the stairs in a reciprocal gait pattern but not down. She has been  working on her exercises.    How long can you stand comfortably?  about 2 hr before surgery    Patient Stated Goals  stairs, improve strength, get back to work, ride horses.    Currently in Pain?  Yes    Pain Score  4     Pain Location  Knee    Pain Orientation  Right    Pain Descriptors / Indicators  Aching    Pain Type  Chronic pain    Pain Onset  More than a month ago    Pain Frequency  Constant    Aggravating Factors   weaight bearing    Pain Relieving Factors  rest, ice, pain meds                       [facility] Adult PT Treatment/Exercise - 03/19/19 0001      Knee/Hip  Exercises: Standing   Heel Raises Limitations  weight shifting forward     Hip Flexion Limitations  practicing knee flexion slowly forward x10       Knee/Hip Exercises: Supine   Quad Sets  10 reps;1 set    Quad Sets Limitations  5 sec holds     Short Arc Quad Sets  2 sets;10 reps    Straight Leg Raises  2 sets;5 reps    Straight Leg Raises Limitations  with mod a       Manual Therapy   Manual Therapy  Passive ROM;Soft tissue mobilization;Joint mobilization    Joint Mobilization  grade 1 and 2 A and AP glides to decrease pain     Soft tissue mobilization  to posterior knee to reduce pain and improve extension     Passive ROM  into flexion and extension              PT Education - 03/19/19 2102    Education Details  continue with HEP, POC, exercises and rationale    Person(s) Educated  Patient    Methods  Explanation;Demonstration;Tactile cues;Verbal cues    Comprehension  Verbalized understanding;Returned demonstration;Tactile cues required;Verbal cues required       PT Short Term Goals - 03/11/19 2147      PT SHORT TERM GOAL #1   Title  Pt will demo full knee extension at heel strike    Baseline  not fully extended at eval    Time  3    Period  Weeks    Status  New    Target Date  04/01/19      PT SHORT TERM GOAL #2   Title  Pt will verbalize complaince to ice and elevation at least 2/day    Baseline  encouraged 3/day at eval    Time  3    Period  Weeks    Status  New    Target Date  04/01/19      PT SHORT TERM GOAL #3   Title  pt will be independent in short term HEP    Baseline  will progress as appropriate    Time  3    Period  Weeks    Status  New    Target Date  04/01/19        PT Long Term Goals - 03/11/19 2148      PT LONG TERM GOAL #1   Title  Pt will be able to tolerate 30 min of gait for functional community ambulation    Baseline  unable at eval  Time  6    Period  Weeks    Status  New    Target Date  04/26/19      PT LONG TERM GOAL  #2   Title  Pt will tolerate mounting and riding a horse at a slow pace    Baseline  unable at eval    Time  6    Period  Weeks    Status  New    Target Date  04/26/19      PT LONG TERM GOAL #3   Title  Pt will demo proper gait pattern along biomechanical chain    Baseline  antalgic at eval, lackingknee ext    Time  6    Period  Weeks    Status  New    Target Date  04/26/19      PT LONG TERM GOAL #4   Title  pt will navigate stairs step-over-step    Baseline  unable at eval    Time  6    Period  Weeks    Status  New    Target Date  04/26/19            Plan - 03/19/19 2103    Clinical Impression Statement  Patient had improved extension with manual therapy. She was strongly encouraged to continue stretching at home s it will help her be able to sleep better. She reported some pain with ther-ex but was able to complete standing ther-ex.    Personal Factors and Comorbidities  Time since onset of injury/illness/exacerbation;Comorbidity 1    Comorbidities  arthritis    Examination-Activity Limitations  Bed Mobility;Bend;Sit;Sleep;Squat;Stairs;Stand;Transfers;Locomotion Level    Examination-Participation Restrictions  Cleaning;Community Activity;Shop;Other    Stability/Clinical Decision Making  Evolving/Moderate complexity    Rehab Potential  Good    PT Frequency  2x / week    PT Duration  6 weeks    PT Treatment/Interventions  ADLs/Self Care Home Management;Cryotherapy;Electrical Stimulation;Gait training;Ultrasound;Moist Heat;Iontophoresis 4mg /ml Dexamethasone;Stair training;Functional mobility training;Therapeutic activities;Therapeutic exercise;Balance training;Neuromuscular re-education;Manual techniques;Patient/family education;Scar mobilization;Passive range of motion;Dry needling;Taping;Vasopneumatic Device    PT Next Visit Plan  extension ROM, gastroc stretch, [place] quad strength, hip abd strength    PT Home Exercise Plan  bike, shoes, gait pattern    Consulted and Agree  with Plan of Care  Patient       Patient will benefit from skilled therapeutic intervention in order to improve the following deficits and impairments:  Abnormal gait, Decreased range of motion, Difficulty walking, Increased muscle spasms, Decreased endurance, Decreased activity tolerance, Pain, Improper body mechanics, Impaired flexibility, Decreased balance, Decreased strength, Increased edema, Postural dysfunction  Visit Diagnosis: 1. Acute pain of right knee   2. Difficulty in walking, not elsewhere classified        Problem List Patient Active Problem List   Diagnosis Date Noted  . 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 Other meniscus derangements, posterior horn of medial meniscus, left knee 02/19/2019  . Bucket handle tear of lateral meniscus 02/19/2019  . Unilateral primary osteoarthritis, right knee 12/13/2018  . Panic disorder 12/13/2018  . Chronic headaches 12/13/2018  . Weakness 09/06/2017  . Depression 09/06/2017  . 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  . Generalized anxiety disorder with panic attacks 09/06/2017  . Right hand pain   . Wrist swelling   . Cellulitis of right upper extremity 04/11/2016  . Elbow pain 07/20/2015  . Anxiety 07/20/2015  . Tachycardia 07/04/2015  . 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[facility])   . Skin ulcer of upper arm, limited  to breakdown of skin ([facility]) 07/01/2015    Dessie Comaavid J Bralon Antkowiak PT DPT  03/19/2019, 9:18 PM  [facility] [address] [place], [place], [address] Phone: [phone]   Fax:  [phone]  Name: Sabrina [name] MRN: [id number] Date of Birth: 08/29/1966

## 2019-03-19 NOTE — Telephone Encounter (Signed)
Neurontin is now controlled.  Okay to refill?

## 2019-03-20 NOTE — Telephone Encounter (Signed)
7 day supply sent

## 2019-03-22 ENCOUNTER — Encounter

## 2019-03-22 ENCOUNTER — Ambulatory Visit: Payer: Self-pay | Admitting: Family Medicine

## 2019-03-25 ENCOUNTER — Ambulatory Visit (INDEPENDENT_AMBULATORY_CARE_PROVIDER_SITE_OTHER): Payer: Self-pay | Admitting: Orthopaedic Surgery

## 2019-03-25 ENCOUNTER — Encounter: Payer: Self-pay | Admitting: Orthopaedic Surgery

## 2019-03-25 ENCOUNTER — Ambulatory Visit: Payer: Self-pay | Admitting: Physical Therapy

## 2019-03-25 ENCOUNTER — Other Ambulatory Visit: Payer: Self-pay

## 2019-03-25 DIAGNOSIS — S83251D Bucket-handle tear of lateral meniscus, current injury, right knee, subsequent encounter: Secondary | ICD-10-CM

## 2019-03-25 MED FILL — DIAZEPAM 10 MG TABS: 10 | 30 days supply | Qty: 90 | Fill #0

## 2019-03-25 NOTE — Progress Notes (Signed)
Office Visit Note   Patient: Sabrina [name]           Date of Birth: November 28, 1965           MRN: [id number] Visit Date: 03/25/2019              Requested by: Isaac Bliss, Rayford Halsted, MD [place],  [place] [address] PCP: Isaac Bliss, Rayford Halsted, MD   Assessment & Plan: Visit Diagnoses:  1. Bucket handle tear of lateral meniscus of right knee, unspecified whether old or current tear, subsequent encounter     Plan: 5 weeks status post right knee arthroscopy with partial lateral and medial meniscectomy and chondroplasty of patella and medial compartment.  Feeling much better.  No fever or chills.  DVT work-up negative.  Not using any ambulatory aid and going to physical therapy.  We will plan to see her back in 1 month.  She is definitely better.  I suspect she will always have some trouble given the amount of arthritis in the medial compartment  Follow-Up Instructions: Return in about 1 month (around 04/25/2019).   Orders:  No orders of the defined types were placed in this encounter.  No orders of the defined types were placed in this encounter.     Procedures: No procedures performed   Clinical Data: No additional findings.   Subjective: No chief complaint on file. 5 weeks status post right knee surgery and feeling much better over the past several weeks.  Not using any ambulatory aid.  Presently involved in physical therapy.  No related fever or chills  HPI  Review of Systems   Objective: Vital Signs: There were no vitals taken for this visit.  Physical Exam  Ortho Exam right knee was not hot red warm.  No effusion.  Little more swelling right knee than left.  No instability.  Still having some medial joint pain but none laterally.  Flexed about 95 degrees  Specialty Comments:  No specialty comments available.  Imaging: No results found.   PMFS History: Patient Active Problem List   Diagnosis Date Noted   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     Other meniscus derangements, posterior horn of medial meniscus, left knee 02/19/2019   Bucket handle tear of lateral meniscus 02/19/2019   Unilateral primary osteoarthritis, right knee 12/13/2018   Panic disorder 12/13/2018   Chronic headaches 12/13/2018   Weakness 09/06/2017   Depression 09/06/2017   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   Generalized anxiety disorder with panic attacks 09/06/2017   Right hand pain    Wrist swelling    Cellulitis of right upper extremity 04/11/2016   Elbow pain 07/20/2015   Anxiety 07/20/2015   Tachycardia 07/04/2015   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[place])    Skin ulcer of upper arm, limited to breakdown of skin ([place]) 07/01/2015   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[place])    Poisoning, snake bite 04/08/2016   "[place]; RUE"   PONV (postoperative nausea and vomiting)    Situational anxiety    Staph infection ~ 2015   "left elbow and finger"    Family History  Problem Relation Age  of Onset   Liver disease Mother    Dementia Mother    Cirrhosis Mother    Prostate cancer Father    Colon cancer Maternal Grandmother    Ovarian cancer Maternal Aunt     Past Surgical History:  Procedure Laterality Date   APPENDECTOMY  ~ 1987   APPLICATION OF A-CELL OF EXTREMITY Left 08/05/2015   Procedure: APPLICATION OF A-CELL OF EXTREMITY;  Surgeon: Peggye Formlaire S Dillingham, DO;  Location: [facility];  Service: Plastics;  Laterality: Left;   BREAST SURGERY Right 1990   "milk duct taken out"   CHONDROPLASTY Right 02/19/2019   Procedure: CHONDROPLASTY; EXCISION EXOSTOSIS;  Surgeon: Valeria BatmanWhitfield, Alysa Duca W, MD;  Location: [facility];   Service: Orthopedics;  Laterality: Right;   DEBRIDEMENT AND CLOSURE WOUND Left 07/01/2015   Procedure: LEFT ELBOW EXCISION OF WOUND WITH PRIMARY CLOSURE 2X5 CM ;  Surgeon: Peggye Formlaire S Dillingham, DO;  Location: [facility];  Service: Plastics;  Laterality: Left;   ELBOW SURGERY Left X 23 in [place] <06/2015   from a cat bite; all I&D   I&D EXTREMITY Left 07/08/2015   Procedure: IRRIGATION AND DEBRIDEMENT EXTREMITY, DRAINAGE OF LEFT ARM WOUND, A-CELL PLACEMENT, WOUND VAC PLACEMENT;  Surgeon: Alena Billslaire S Dillingham, DO;  Location[facility];  Service: Plastics;  Laterality: Left;   INCISION AND DRAINAGE OF WOUND Left 08/05/2015   Procedure: IRRIGATION AND DEBRIDEMENT LEFT ELBOW WOUND, PLACEMENT OF ACELL;  Surgeon: Peggye Formlaire S Dillingham, DO;  Location: [facility];  Service: Plastics;  Laterality: Left;   KNEE ARTHROSCOPY WITH MEDIAL MENISECTOMY Right 02/19/2019   Procedure: RIGHT KNEE ARTHROSCOPY, DEBRIDEMENT, PARTIAL MEDIAL AND LATERAL MENISECTOMY;  Surgeon: Valeria BatmanWhitfield, Square Jowett W, MD;  Location: [facility];  Service: Orthopedics;  Laterality: Right;   LAPAROSCOPIC CHOLECYSTECTOMY  1998   SKIN GRAFT Left 2016   took from anterior thigh; placed at elbow   TONSILLECTOMY  ~ 2000   TOTAL ABDOMINAL HYSTERECTOMY  2003   WRIST SURGERY Right 01/2016   Social History   Occupational History   Occupation: unemployed  Tobacco Use   Smoking status: Never Smoker   Smokeless tobacco: Never Used  Substance and Sexual Activity   Alcohol use: Yes    Alcohol/week: 0.0 standard drinks    Comment: social   Drug use: No   Sexual activity: Not Currently     Valeria BatmanPeter W Duron Meister, MD   Note - This record has been created using [employer] software.  Chart creation errors have been sought, but may not always  have been located. Such creation errors do not reflect on  the standard of medical care.

## 2019-03-26 ENCOUNTER — Encounter: Payer: Self-pay | Admitting: Orthopaedic Surgery

## 2019-03-26 ENCOUNTER — Ambulatory Visit: Payer: Self-pay | Admitting: Orthopaedic Surgery

## 2019-03-26 ENCOUNTER — Telehealth: Payer: Self-pay | Admitting: Orthopaedic Surgery

## 2019-03-26 NOTE — Telephone Encounter (Signed)
Ask her if tramadol is enough and  I can send in tomorrow-thanks

## 2019-03-26 NOTE — Telephone Encounter (Signed)
Patient called requesting a prescription for a pain medication that is not as strong as Oxycodone that she can take when she attends physical therapy.  Patient states the Oxycodone is too strong and Tylenol is not strong enough.  Patient requested a return call.

## 2019-03-26 NOTE — Telephone Encounter (Signed)
Spoke with patient. She is fine with Tramadol. She wants it sent to [facility] outpatient pharmacy.

## 2019-03-26 NOTE — Telephone Encounter (Signed)
Please see below. She has also sent a message through [employer]. Thanks!

## 2019-03-27 ENCOUNTER — Ambulatory Visit: Payer: Self-pay | Admitting: Physical Therapy

## 2019-03-27 ENCOUNTER — Other Ambulatory Visit: Payer: Self-pay

## 2019-03-27 ENCOUNTER — Ambulatory Visit (INDEPENDENT_AMBULATORY_CARE_PROVIDER_SITE_OTHER): Payer: Self-pay | Admitting: Family Medicine

## 2019-03-27 ENCOUNTER — Other Ambulatory Visit: Payer: Self-pay | Admitting: Orthopaedic Surgery

## 2019-03-27 ENCOUNTER — Encounter: Payer: Self-pay | Admitting: Physical Therapy

## 2019-03-27 DIAGNOSIS — M25561 Pain in right knee: Secondary | ICD-10-CM

## 2019-03-27 DIAGNOSIS — R262 Difficulty in walking, not elsewhere classified: Secondary | ICD-10-CM

## 2019-03-27 DIAGNOSIS — R3915 Urgency of urination: Secondary | ICD-10-CM

## 2019-03-27 MED ORDER — TRAMADOL HCL 50 MG PO TABS: 50.0000 mg | ORAL_TABLET | Freq: Three times a day (TID) | ORAL | 0 refills | Status: DC | PRN

## 2019-03-27 MED ORDER — SOLIFENACIN SUCCINATE 5 MG PO TABS: 5.0000 mg | ORAL_TABLET | Freq: Every day | ORAL | 5 refills | Status: DC

## 2019-03-27 MED FILL — traMADol HCL 50 MG TABS: 50 | 10 days supply | Qty: 30 | Fill #0

## 2019-03-27 MED FILL — SOLIFENACIN SUCCINATE 5 MG: 5 | 30 days supply | Qty: 30 | Fill #0

## 2019-03-27 NOTE — Progress Notes (Signed)
Patient ID: Sabrina [name], female   DOB: 04/11/1966, 53 y.o.   MRN: [id number]   This visit type was conducted due to national recommendations for restrictions regarding the COVID-19 pandemic in an effort to limit this patient's exposure and mitigate transmission in our community.   Virtual Visit via Telephone Note  I connected with Sabrina [name] on 03/27/19 at 11:30 AM EDT by telephone and verified that I am speaking with the correct person using two identifiers.   I discussed the limitations, risks, security and privacy concerns of performing an evaluation and management service by telephone and the availability of in person appointments. I also discussed with the patient that there may be a patient responsible charge related to this service. The patient expressed understanding and agreed to proceed.  Location patient: home Location provider: work or home office Participants present for the call: patient, provider Patient did not have a visit in the prior 7 days to address this/these issue(s).   History of Present Illness: Patient called with what has apparently been a several months problem of urine frequency.  She has no burning with urination.  She has good stream and feels that she is emptying her bladder.  This sounds like more of an urgency.  Usually gets up about 2 times per night.  Frequently has to urinate 30 minutes to an hour after going.  This has been going on for some time and is not acute onset.  She has had normal blood sugars in the past.  No weight loss.  No thirst.  No caffeine use.  No urine incontinence  She has some chronic anxiety and thinks that this may exacerbate her urine frequency.  She states her urine frequency is becoming more disruptive to her life.  No history of glaucoma.  No history of small bowel obstruction.  No current opioid use.   Observations/Objective: Patient sounds cheerful and well on the phone. I do not appreciate any SOB. Speech and thought  processing are grossly intact. Patient reported vitals:  Assessment and Plan:  Urinary urgency.  Patient has had increasing symptoms over many months.  Based on her history, no clinical suspicion for diabetes, infection, or central diabetes insipidus  -We have reiterated the importance of lifestyle management with continue to avoid caffeine, avoid excessive fluids at night  -Discussed possible medications.  She states she currently has no insurance and would like to look at possible generic medications.  We explained that anticholinergics such as oxybutynin, Detrol, Vesicare can cause side effects such as dry mouth or constipation.  No contraindications. We agreed to trial of Vesicare 5 mg once daily  -Recommend follow-up with primary within a few weeks to give some feedback.  We discussed other medication options such as Myrbetriq which could cause fewer anticholinergic side effects but also likely much more expensive  Follow Up Instructions:  -As above   [id number] [id number] [id number] 11-20 [id number] 21-30 I did not refer this patient for an OV in the next 24 hours for this/these issue(s).  I discussed the assessment and treatment plan with the patient. The patient was provided an opportunity to ask questions and all were answered. The patient agreed with the plan and demonstrated an understanding of the instructions.   The patient was advised to call back or seek an in-person evaluation if the symptoms worsen or if the condition fails to improve as anticipated.  I provided 18 minutes of non-face-to-face time during this encounter.   Carolann Littler, MD

## 2019-03-27 NOTE — Telephone Encounter (Signed)
sent 
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
## 2019-03-29 ENCOUNTER — Other Ambulatory Visit: Payer: Self-pay

## 2019-03-29 ENCOUNTER — Encounter: Payer: Self-pay | Admitting: Family Medicine

## 2019-03-29 ENCOUNTER — Ambulatory Visit (INDEPENDENT_AMBULATORY_CARE_PROVIDER_SITE_OTHER): Payer: Self-pay | Admitting: Family Medicine

## 2019-03-29 DIAGNOSIS — R059 Cough, unspecified: Secondary | ICD-10-CM

## 2019-03-29 DIAGNOSIS — R05 Cough: Secondary | ICD-10-CM

## 2019-03-29 NOTE — Progress Notes (Signed)
Virtual Visit via Video Note  I connected with Sabrina [name]  on 03/29/19 at  9:30 AM EDT by a video enabled telemedicine application and verified that I am speaking with the correct person using two identifiers.  Location patient: home Location provider:work or home office Persons participating in the virtual visit: patient, provider  I discussed the limitations of evaluation and management by telemedicine and the availability of in person appointments. The patient expressed understanding and agreed to proceed.   HPI: Pt is a 53 yo female with pmh sif for OA, bullous pemphigoid, GAD with panic, chronic HAs, seen for acute concern.  Pt typically seen by Dr. Jerilee Hoh.  Pt states she typically gets bronchitis a few times/year.  Having increased coughing x 2 days.  Tried robitussin, mucinex and delsym.  Feels like loosing her voice.  Doesn't feel bad.  Coughing worse at night, can't sleep.  Pt denies fever, rhinorrhea, sore throat, ear pain, facial pain or pressure, CP, HAs, sick contacts.  Had negative COVID-19 testing a month ago prior to knee surgery.  Mostly staying at home, has gone therapy appts.  ROS: See pertinent positives and negatives per HPI.  Past Medical History:  Diagnosis Date  . Arthritis    "knees" (09/06/2017)  . Bullous pemphigus   . Cat bite 06/2014   to left elbow  . History of blood transfusion 1988   "when I had my baby"  . Muscle weakness of lower extremity 2001; 09/05/2017   "resolved after a couple weeks; ?" (09/06/2017)  . 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 Poisoning, snake bite 04/08/2016   "[place]; RUE"  . PONV (postoperative nausea and vomiting)   . Situational anxiety   . Staph infection ~ 2015   "left elbow and finger"    Past Surgical History:  Procedure Laterality Date  . APPENDECTOMY  ~ 1987  . APPLICATION OF A-CELL OF EXTREMITY Left 08/05/2015   Procedure: APPLICATION OF A-CELL OF EXTREMITY;  Surgeon: Wallace Going, DO;  Location:  [facility];  Service: Plastics;  Laterality: Left;  . BREAST SURGERY Right 1990   "milk duct taken out"  . CHONDROPLASTY Right 02/19/2019   Procedure: CHONDROPLASTY; EXCISION EXOSTOSIS;  Surgeon: Garald Balding, MD;  Location: [place] [facility];  Service: Orthopedics;  Laterality: Right;  . DEBRIDEMENT AND CLOSURE WOUND Left 07/01/2015   Procedure: LEFT ELBOW EXCISION OF WOUND WITH PRIMARY CLOSURE 2X5 CM ;  Surgeon: Wallace Going, DO;  Location: [facility];  Service: Plastics;  Laterality: Left;  . ELBOW SURGERY Left X 23 in [place] <06/2015   from a cat bite; all I&D  . I&D EXTREMITY Left 07/08/2015   Procedure: IRRIGATION AND DEBRIDEMENT EXTREMITY, DRAINAGE OF LEFT ARM WOUND, A-CELL PLACEMENT, WOUND VAC PLACEMENT;  Surgeon: Loel Lofty Dillingham, DO;  [facility];  Service: Plastics;  Laterality: Left;  . INCISION AND DRAINAGE OF WOUND Left 08/05/2015   Procedure: IRRIGATION AND DEBRIDEMENT LEFT ELBOW WOUND, PLACEMENT OF ACELL;  Surgeon: Wallace Going, DO;  Location: [facility];  Service: Plastics;  Laterality: Left;  . KNEE ARTHROSCOPY WITH MEDIAL MENISECTOMY Right 02/19/2019   Procedure: RIGHT KNEE ARTHROSCOPY, DEBRIDEMENT, PARTIAL MEDIAL AND LATERAL MENISECTOMY;  Surgeon: Garald Balding, MD;  Location: [facility];  Service: Orthopedics;  Laterality: Right;  . LAPAROSCOPIC CHOLECYSTECTOMY  1998  . SKIN GRAFT Left 2016   took from anterior thigh; placed at elbow  . TONSILLECTOMY  ~ 2000  .  TOTAL ABDOMINAL HYSTERECTOMY  2003  . WRIST SURGERY Right 01/2016    Family History  Problem Relation Age of Onset  . Liver disease Mother   . Dementia Mother   . Cirrhosis Mother   . Prostate cancer Father   . Colon cancer Maternal Grandmother   . Ovarian cancer Maternal Aunt      Current Outpatient Medications:  .  butalbital-acetaminophen-caffeine (FIORICET) 50-325-40 MG tablet, Take 1 tablet by mouth  every 6 (six) hours as needed for headache., Disp: 10 tablet, Rfl: 3 .  diazepam (VALIUM) 10 MG tablet, Take 1 tablet (10 mg) twice a day for one week then, IF NEEDED, increase to 1 tablet (10 mg) in the morning and 2 tablets (20 mg) at bedtime, Disp: 90 tablet, Rfl: 3 .  gabapentin (NEURONTIN) 300 MG capsule, Take 2 capsules (600 mg total) by mouth 3 (three) times daily., Disp: 21 capsule, Rfl: 0 .  promethazine (PHENERGAN) 25 MG suppository, Place 1 suppository (25 mg total) rectally every 6 (six) hours as needed for nausea or vomiting., Disp: 6 each, Rfl: 0 .  rizatriptan (MAXALT-MLT) 10 MG disintegrating tablet, Take 1 tablet earliest onset of migraine.  May repeat in 2 hours if needed.  Maximum 2 tablets in 24hrs, Disp: 9 tablet, Rfl: 3 .  solifenacin (VESICARE) 5 MG tablet, Take 1 tablet (5 mg total) by mouth daily., Disp: 30 tablet, Rfl: 5 .  traMADol (ULTRAM) 50 MG tablet, Take 1 tablet (50 mg total) by mouth 3 (three) times daily as needed., Disp: 30 tablet, Rfl: 0 .  venlafaxine XR (EFFEXOR XR) 37.5 MG 24 hr capsule, 2 capsules qam, Disp: 60 capsule, Rfl: 5  EXAM:  VITALS per patient if applicable: RR between 12-20 bpm  GENERAL: alert, oriented, appears well and in no acute distress  HEENT: atraumatic, conjunctiva clear, no obvious abnormalities on inspection of external nose and ears  NECK: normal movements of the head and neck  LUNGS: on inspection no signs of respiratory distress, breathing rate appears normal, no obvious gross SOB, gasping or wheezing  CV: no obvious cyanosis  MS: moves all visible extremities without noticeable abnormality  PSYCH/NEURO: pleasant and cooperative, no obvious depression or anxiety, speech and thought processing grossly intact  ASSESSMENT AND PLAN:  Discussed the following assessment and plan:  Cough -supportive care: discussed flonase, hydration, gargling with warm salt water or chloraseptic spray if needed, OTC cough/cold med   -pt with  intolerance to tessalon, causes watery eyes -advised against abx at this time as symptoms likely viral in nature. -given precautions.  For continued symptoms consider repeat COVID testing and in person visit for lung exam.  F/u prn   I discussed the assessment and treatment plan with the patient. The patient was provided an opportunity to ask questions and all were answered. The patient agreed with the plan and demonstrated an understanding of the instructions.   The patient was advised to call back or seek an in-person evaluation if the symptoms worsen or if the condition fails to improve as anticipated.   Deeann SaintShannon R Alleen Kehm, MD

## 2019-03-30 ENCOUNTER — Emergency Department (HOSPITAL_BASED_OUTPATIENT_CLINIC_OR_DEPARTMENT_OTHER)
Admission: EM | Admit: 2019-03-30 | Discharge: 2019-03-30 | Disposition: A | Discharge status: Home / Self Care | Payer: Self-pay | Attending: Emergency Medicine | Admitting: Emergency Medicine

## 2019-03-30 ENCOUNTER — Other Ambulatory Visit: Payer: Self-pay

## 2019-03-30 ENCOUNTER — Encounter (HOSPITAL_BASED_OUTPATIENT_CLINIC_OR_DEPARTMENT_OTHER): Payer: Self-pay | Admitting: Emergency Medicine

## 2019-03-30 ENCOUNTER — Emergency Department (HOSPITAL_BASED_OUTPATIENT_CLINIC_OR_DEPARTMENT_OTHER): Payer: Self-pay

## 2019-03-30 DIAGNOSIS — J069 Acute upper respiratory infection, unspecified: Secondary | ICD-10-CM | POA: Insufficient documentation

## 2019-03-30 DIAGNOSIS — Z20828 Contact with and (suspected) exposure to other viral communicable diseases: Secondary | ICD-10-CM | POA: Insufficient documentation

## 2019-03-30 DIAGNOSIS — Z79899 Other long term (current) drug therapy: Secondary | ICD-10-CM | POA: Insufficient documentation

## 2019-03-30 NOTE — Discharge Instructions (Addendum)
Be sure to drink plenty of water, avoid dairy to help keep mucous thin. Saline sinus rinse twice daily followed by Flonase twice daily. Use the Flonase twice daily x 5 days then continue with daily use. Take Zyrtec daily. Take Sudafed as directed (this is available over the counter from the pharmacist). Cool mist vaporizer to the room at night, sleep in a recliner or propped up on pillow as lying flat may aggravate cough.  Night time cough medicine as needed as directed, repeat dose as directed throughout the night if needed.  Check your mychart or call for your covid results, this will take at least 24 hours, possibly up to 72.

## 2019-03-30 NOTE — ED Triage Notes (Addendum)
Cough for 3 days. Neg COVID test 1 month ago. States it feels like bronchitis.

## 2019-03-30 NOTE — ED Provider Notes (Signed)
[facility] EMERGENCY DEPARTMENT Provider Note   CSN: [id number] Arrival date & time: 03/30/19  1229    History   Chief Complaint Chief Complaint  Patient presents with  . Cough    HPI Sabrina [name] is a 53 y.o. female.     53 year old female, history of general anxiety and panic attacks, viral bronchitis, non-smoker, no history of chronic lung disease presents with complaint of cough x3 days.  Patient has tried multiple over-the-counter medications however reports persistent cough which is worse at night and is keeping herself and her family members awake at night.  Denies fevers, chills, sick contacts or recent travel.  Patient feels like at this point her infection is more in her neck area and not so much in her lungs, believes she has bronchitis, states she gets bronchitis twice a year with the last episode being in October 2019.  Sabrina JabsDana L [name] was evaluated in Emergency Department on 03/30/2019 for the symptoms described in the history of present illness. She was evaluated in the context of the global COVID-19 pandemic, which necessitated consideration that the patient might be at risk for infection with the SARS-CoV-2 virus that causes COVID-19. Institutional protocols and algorithms that pertain to the evaluation of patients at risk for COVID-19 are in a state of rapid change based on information released by regulatory bodies including the CDC and federal and state organizations. These policies and algorithms were followed during the patient's care in the ED.      Past Medical History:  Diagnosis Date  . Arthritis    "knees" (09/06/2017)  . Bullous pemphigus   . Cat bite 06/2014   to left elbow  . History of blood transfusion 1988   "when I had my baby"  . Muscle weakness of lower extremity 2001; 09/05/2017   "resolved after a couple weeks; ?" (09/06/2017)  . 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 Poisoning, snake bite 04/08/2016   "[place]; RUE"  . PONV  (postoperative nausea and vomiting)   . Situational anxiety   . Staph infection ~ 2015   "left elbow and finger"    Patient Active Problem List   Diagnosis Date Noted  . 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 Other meniscus derangements, posterior horn of medial meniscus, left knee 02/19/2019  . Bucket handle tear of lateral meniscus 02/19/2019  . Unilateral primary osteoarthritis, right knee 12/13/2018  . Panic disorder 12/13/2018  . Chronic headaches 12/13/2018  . Weakness 09/06/2017  . Depression 09/06/2017  . 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  . Generalized anxiety disorder with panic attacks 09/06/2017  . Right hand pain   . Wrist swelling   . Cellulitis of right upper extremity 04/11/2016  . Elbow pain 07/20/2015  . Anxiety 07/20/2015  . Tachycardia 07/04/2015  . 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HCC)   . Skin ulcer of upper arm, limited to breakdown of skin (HCC) 07/01/2015    Past Surgical History:  Procedure Laterality Date  . APPENDECTOMY  ~ 1987  . APPLICATION OF A-CELL OF EXTREMITY Left 08/05/2015   Procedure: APPLICATION OF A-CELL OF EXTREMITY;  Surgeon: Peggye Formlaire S Dillingham, DO;  Location: [facility];  Service: Plastics;  Laterality: Left;  . BREAST SURGERY Right 1990   "milk duct taken out"  . CHONDROPLASTY Right 02/19/2019   Procedure: CHONDROPLASTY; EXCISION EXOSTOSIS;  Surgeon: Valeria BatmanWhitfield, Peter W, MD;  Location: [facility];  Service: Orthopedics;  Laterality: Right;  . DEBRIDEMENT AND CLOSURE WOUND Left 07/01/2015   Procedure: LEFT  ELBOW EXCISION OF WOUND WITH PRIMARY CLOSURE 2X5 CM ;  Surgeon: Peggye Formlaire S Dillingham, DO;  Location: [facility];  Service: Plastics;  Laterality: Left;  . ELBOW SURGERY Left X 23 in [place] <06/2015   from a cat bite; all I&D  . I&D EXTREMITY Left 07/08/2015   Procedure: IRRIGATION AND DEBRIDEMENT EXTREMITY, DRAINAGE OF LEFT ARM WOUND, A-CELL PLACEMENT, WOUND VAC PLACEMENT;  Surgeon: Alena Billslaire S Dillingham, DO;   Location: [facility];  Service: Plastics;  Laterality: Left;  . INCISION AND DRAINAGE OF WOUND Left 08/05/2015   Procedure: IRRIGATION AND DEBRIDEMENT LEFT ELBOW WOUND, PLACEMENT OF ACELL;  Surgeon: Peggye Formlaire S Dillingham, DO;  Location: [facility];  Service: Plastics;  Laterality: Left;  . KNEE ARTHROSCOPY WITH MEDIAL MENISECTOMY Right 02/19/2019   Procedure: RIGHT KNEE ARTHROSCOPY, DEBRIDEMENT, PARTIAL MEDIAL AND LATERAL MENISECTOMY;  Surgeon: Valeria BatmanWhitfield, Peter W, MD;  Location: [facility];  Service: Orthopedics;  Laterality: Right;  . LAPAROSCOPIC CHOLECYSTECTOMY  1998  . SKIN GRAFT Left 2016   took from anterior thigh; placed at elbow  . TONSILLECTOMY  ~ 2000  . TOTAL ABDOMINAL HYSTERECTOMY  2003  . WRIST SURGERY Right 01/2016     OB History   No obstetric history on file.      Home Medications    Prior to Admission medications   Medication Sig Start Date End Date Taking? Authorizing Provider  butalbital-acetaminophen-caffeine (FIORICET) 50-325-40 MG tablet Take 1 tablet by mouth every 6 (six) hours as needed for headache. 02/26/19   Drema DallasJaffe, Adam R, DO  diazepam (VALIUM) 10 MG tablet Take 1 tablet (10 mg) twice a day for one week then, IF NEEDED, increase to 1 tablet (10 mg) in the morning and 2 tablets (20 mg) at bedtime 03/01/19   Plovsky, Earvin HansenGerald, MD  gabapentin (NEURONTIN) 300 MG capsule Take 2 capsules (600 mg total) by mouth 3 (three) times daily. 03/19/19   Nafziger, Kandee Keenory, NP  promethazine (PHENERGAN) 25 MG suppository Place 1 suppository (25 mg total) rectally every 6 (six) hours as needed for nausea or vomiting. 03/03/19   Jeannie FendMurphy, Jamion Carter A, PA-C  rizatriptan (MAXALT-MLT) 10 MG disintegrating tablet Take 1 tablet earliest onset of migraine.  May repeat in 2 hours if needed.  Maximum 2 tablets in 24hrs 01/23/19   Everlena CooperJaffe, Adam R, DO  solifenacin (VESICARE) 5 MG tablet Take 1 tablet (5 mg total) by mouth daily. 03/27/19   Burchette, Elberta FortisBruce W, MD  traMADol (ULTRAM) 50 MG  tablet Take 1 tablet (50 mg total) by mouth 3 (three) times daily as needed. 03/27/19   Valeria BatmanWhitfield, Peter W, MD  venlafaxine XR (EFFEXOR XR) 37.5 MG 24 hr capsule 2 capsules qam 03/01/19   Archer AsaPlovsky, Gerald, MD    Family History Family History  Problem Relation Age of Onset  . Liver disease Mother   . Dementia Mother   . Cirrhosis Mother   . Prostate cancer Father   . Colon cancer Maternal Grandmother   . Ovarian cancer Maternal Aunt     Social History Social History   Tobacco Use  . Smoking status: Never Smoker  . Smokeless tobacco: Never Used  Substance Use Topics  . Alcohol use: Yes    Alcohol/week: 0.0 standard drinks    Comment: social  . Drug use: No     Allergies   Zofran [ondansetron hcl], Droperidol, Flexeril [cyclobenzaprine], Morphine and related, Tessalon [benzonatate], and Voltaren [diclofenac sodium]   Review of Systems Review of Systems  Constitutional: Negative for  chills, diaphoresis and fever.  HENT: Positive for postnasal drip. Negative for congestion, rhinorrhea and sore throat.   Respiratory: Positive for cough. Negative for shortness of breath.   Gastrointestinal: Negative for nausea and vomiting.  Musculoskeletal: Negative for arthralgias and myalgias.  Skin: Negative for rash and wound.  Allergic/Immunologic: Negative for immunocompromised state.  Neurological: Negative for headaches.  Hematological: Negative for adenopathy.  Psychiatric/Behavioral: Negative for confusion.  All other systems reviewed and are negative.    Physical Exam Updated Vital Signs BP 110/71 (BP Location: Right Arm)   Pulse 100   Temp 98.3 F (36.8 C) (Oral)   Resp 18   Ht 5\' 4"  (1.626 m)   Wt 80.7 kg   SpO2 99%   BMI 30.55 kg/m   Physical Exam Vitals signs and nursing note reviewed.  Constitutional:      General: She is not in acute distress.    Appearance: She is well-developed. She is not diaphoretic.  HENT:     Head: Normocephalic and atraumatic.      Right Ear: Tympanic membrane and ear canal normal.     Left Ear: Tympanic membrane and ear canal normal.     Mouth/Throat:     Mouth: Mucous membranes are moist.     Pharynx: No oropharyngeal exudate or posterior oropharyngeal erythema.     Comments: Post nasal drip (clear) Neck:     Musculoskeletal: Neck supple. No muscular tenderness.  Cardiovascular:     Rate and Rhythm: Normal rate and regular rhythm.     Pulses: Normal pulses.     Heart sounds: Normal heart sounds.  Pulmonary:     Effort: Pulmonary effort is normal.     Breath sounds: Normal breath sounds.  Skin:    General: Skin is warm and dry.     Findings: No erythema or rash.  Neurological:     Mental Status: She is alert and oriented to person, place, and time.  Psychiatric:        Behavior: Behavior normal.      ED Treatments / Results  Labs (all labs ordered are listed, but only abnormal results are displayed) Labs Reviewed  NOVEL CORONAVIRUS, NAA (HOSPITAL ORDER, SEND-OUT TO REF LAB)    EKG None  Radiology Dg Chest 2 View  Result Date: 03/30/2019 CLINICAL DATA:  Three-day history of cough. Negative COVID-19 testing. Nonsmoker. EXAM: CHEST - 2 VIEW COMPARISON:  07/12/2018 and earlier. FINDINGS: Cardiomediastinal silhouette unremarkable. Lungs clear. Bronchovascular markings normal. Pulmonary vascularity normal. No pneumothorax. No pleural effusions. Visualized bony thorax intact. No interval change. IMPRESSION: Normal and stable examination. Electronically Signed   By: Hulan Saashomas  Lawrence M.D.   On: 03/30/2019 13:02    Procedures Procedures (including critical care time)  Medications Ordered in ED Medications - No data to display   Initial Impression / Assessment and Plan / ED Course  I have reviewed the triage vital signs and the nursing notes.  Pertinent labs & imaging results that were available during my care of the patient were reviewed by me and considered in my medical decision making (see chart  for details).  Clinical Course as of Mar 29 1406  Sat Mar 30, 2019  [id number] 53 year old female with complaint of cough x3 days.  Denies shortness of breath, fevers, chills, sweats, sick contacts.  Patient believes this to be bronchitis, states she gets bronchitis twice a year.  Lung sounds are clear, chest x-ray is negative for pneumonia or evidence of COVID-19 infection.  Patient has clear  postnasal drip.  Recommend saline sinus rinse, Flonase, Zyrtec, Sudafed.  Discussed use of nighttime cough medications.  Recommend patient recheck with her PCP for worsening or concerning symptoms.  Patient agreeable with COVID test sent out today.   [LM]    Clinical Course User Index [LM] Tacy Learn, PA-C      Final Clinical Impressions(s) / ED Diagnoses   Final diagnoses:  Viral URI with cough    ED Discharge Orders    None       Tacy Learn, PA-C 03/30/19 1407    Maudie Flakes, MD 03/31/19 [phone]

## 2019-03-31 ENCOUNTER — Emergency Department (HOSPITAL_COMMUNITY)
Admission: EM | Admit: 2019-03-31 | Discharge: 2019-03-31 | Disposition: A | Discharge status: Home / Self Care | Payer: Self-pay | Attending: Emergency Medicine | Admitting: Emergency Medicine

## 2019-03-31 ENCOUNTER — Other Ambulatory Visit: Payer: Self-pay

## 2019-03-31 DIAGNOSIS — R059 Cough, unspecified: Secondary | ICD-10-CM

## 2019-03-31 DIAGNOSIS — R05 Cough: Secondary | ICD-10-CM | POA: Insufficient documentation

## 2019-03-31 MED ORDER — HYDROCODONE-HOMATROPINE 5-1.5 MG/5ML PO SYRP: 5.0000 mL | ORAL_SOLUTION | Freq: Four times a day (QID) | ORAL | 0 refills | Status: DC | PRN

## 2019-03-31 NOTE — Discharge Instructions (Signed)
You were seen in the ED today for a cough Your chest x ray yesterday was clear without signs of pneumonia You have a covid 19 test pending Please self isolate at home until you receive your results Please take cough medication as prescribed I also recommend zyrtec and flonase daily for symptom relief Follow up with your PCP

## 2019-03-31 NOTE — ED Triage Notes (Signed)
Patient presents to the ED with complaints of cough started 3 days. She reports OTC medications without relief. She reports sore throat and chest pain due to cough. Denies shortness of breath. Patient reports alert and oriented. Reports was test for COVID pending test result.

## 2019-03-31 NOTE — ED Notes (Signed)
PA aware of patient BP

## 2019-03-31 NOTE — ED Provider Notes (Signed)
[facility]ENC[facility] Provider Note   CSN: [id number] Arrival date & time: 03/31/19  0855    History   Chief Complaint Chief Complaint  Patient presents with  . Cough    HPI Sabrina [name] is a 53 y.o. female who presents to the ED today complaining of a gradual onset, constant, productive cough x 4 days. Pt reports she has been seen  By both her PCP via telemedicine and at [facility] ED yesterday; had a full workup which was negative. She states she has tried everything OTC without relief including mucinex, nyquil, flonase, sudafed, and antihistamines. She states she has taken tessalon perles in the past but she had a reaction to it and does not want to take it again. She currently has a covid test pending. She returned to the ED today because the cough keeps her up at nighttime and she would like a decent nights rest. No new symptoms since being seen in the ED and evaluated yesterday. Denies fever, chills, shortness of breath, chest pain.       The history is provided by the patient and medical records.    Past Medical History:  Diagnosis Date  . Arthritis    "knees" (09/06/2017)  . Bullous pemphigus   . Cat bite 06/2014   to left elbow  . History of blood transfusion 1988   "when I had my baby"  . Muscle weakness of lower extremity 2001; 09/05/2017   "resolved after a couple weeks; ?" (09/06/2017)  . 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 Poisoning, snake bite 04/08/2016   "[place]; RUE"  . PONV (postoperative nausea and vomiting)   . Situational anxiety   . Staph infection ~ 2015   "left elbow and finger"    Patient Active Problem List   Diagnosis Date Noted  . 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 Other meniscus derangements, posterior horn of medial meniscus, left knee 02/19/2019  . Bucket handle tear of lateral meniscus 02/19/2019  . Unilateral primary osteoarthritis, right knee 12/13/2018  . Panic disorder 12/13/2018  . Chronic headaches  12/13/2018  . Weakness 09/06/2017  . Depression 09/06/2017  . 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  . Generalized anxiety disorder with panic attacks 09/06/2017  . Right hand pain   . Wrist swelling   . Cellulitis of right upper extremity 04/11/2016  . Elbow pain 07/20/2015  . Anxiety 07/20/2015  . Tachycardia 07/04/2015  . 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HCC)   . Skin ulcer of upper arm, limited to breakdown of skin (HCC) 07/01/2015    Past Surgical History:  Procedure Laterality Date  . APPENDECTOMY  ~ 1987  . APPLICATION OF A-CELL OF EXTREMITY Left 08/05/2015   Procedure: APPLICATION OF A-CELL OF EXTREMITY;  Surgeon: Peggye Formlaire S Dillingham, DO;  Location: [facility];  Service: Plastics;  Laterality: Left;  . BREAST SURGERY Right 1990   "milk duct taken out"  . CHONDROPLASTY Right 02/19/2019   Procedure: CHONDROPLASTY; EXCISION EXOSTOSIS;  Surgeon: Valeria BatmanWhitfield, Peter W, MD;  Location: [facility];  Service: Orthopedics;  Laterality: Right;  . DEBRIDEMENT AND CLOSURE WOUND Left 07/01/2015   Procedure: LEFT ELBOW EXCISION OF WOUND WITH PRIMARY CLOSURE 2X5 CM ;  Surgeon: Peggye Formlaire S Dillingham, DO;  Location: [facility];  Service: Plastics;  Laterality: Left;  . ELBOW SURGERY Left X 23 in [place] <06/2015   from a cat bite; all I&D  . I&D EXTREMITY Left 07/08/2015   Procedure: IRRIGATION AND DEBRIDEMENT EXTREMITY,  DRAINAGE OF LEFT ARM WOUND, A-CELL PLACEMENT, WOUND VAC PLACEMENT;  Surgeon: Alena Billslaire S Dillingham, DO;  Location: [facility];  Service: Plastics;  Laterality: Left;  . INCISION AND DRAINAGE OF WOUND Left 08/05/2015   Procedure: IRRIGATION AND DEBRIDEMENT LEFT ELBOW WOUND, PLACEMENT OF ACELL;  Surgeon: Peggye Formlaire S Dillingham, DO;  Location: [facility];  Service: Plastics;  Laterality: Left;  . KNEE ARTHROSCOPY WITH MEDIAL MENISECTOMY Right 02/19/2019   Procedure: RIGHT KNEE ARTHROSCOPY, DEBRIDEMENT, PARTIAL MEDIAL AND LATERAL MENISECTOMY;  Surgeon:  Valeria BatmanWhitfield, Peter W, MD;  Location: [facility];  Service: Orthopedics;  Laterality: Right;  . LAPAROSCOPIC CHOLECYSTECTOMY  1998  . SKIN GRAFT Left 2016   took from anterior thigh; placed at elbow  . TONSILLECTOMY  ~ 2000  . TOTAL ABDOMINAL HYSTERECTOMY  2003  . WRIST SURGERY Right 01/2016     OB History   No obstetric history on file.      Home Medications    Prior to Admission medications   Medication Sig Start Date End Date Taking? Authorizing Provider  butalbital-acetaminophen-caffeine (FIORICET) 50-325-40 MG tablet Take 1 tablet by mouth every 6 (six) hours as needed for headache. 02/26/19   Drema DallasJaffe, Adam R, DO  diazepam (VALIUM) 10 MG tablet Take 1 tablet (10 mg) twice a day for one week then, IF NEEDED, increase to 1 tablet (10 mg) in the morning and 2 tablets (20 mg) at bedtime 03/01/19   Plovsky, Earvin HansenGerald, MD  gabapentin (NEURONTIN) 300 MG capsule Take 2 capsules (600 mg total) by mouth 3 (three) times daily. 03/19/19   Nafziger, Kandee Keenory, NP  HYDROcodone-homatropine (HYCODAN) 5-1.5 MG/5ML syrup Take 5 mLs by mouth every 6 (six) hours as needed for cough. 03/31/19   Tanda RockersVenter, Annessa Satre, PA-C  promethazine (PHENERGAN) 25 MG suppository Place 1 suppository (25 mg total) rectally every 6 (six) hours as needed for nausea or vomiting. 03/03/19   Jeannie FendMurphy, Laura A, PA-C  rizatriptan (MAXALT-MLT) 10 MG disintegrating tablet Take 1 tablet earliest onset of migraine.  May repeat in 2 hours if needed.  Maximum 2 tablets in 24hrs 01/23/19   Everlena CooperJaffe, Adam R, DO  solifenacin (VESICARE) 5 MG tablet Take 1 tablet (5 mg total) by mouth daily. 03/27/19   Burchette, Elberta FortisBruce W, MD  traMADol (ULTRAM) 50 MG tablet Take 1 tablet (50 mg total) by mouth 3 (three) times daily as needed. 03/27/19   Valeria BatmanWhitfield, Peter W, MD  venlafaxine XR (EFFEXOR XR) 37.5 MG 24 hr capsule 2 capsules qam 03/01/19   Archer AsaPlovsky, Gerald, MD    Family History Family History  Problem Relation Age of Onset  . Liver disease Mother   .  Dementia Mother   . Cirrhosis Mother   . Prostate cancer Father   . Colon cancer Maternal Grandmother   . Ovarian cancer Maternal Aunt     Social History Social History   Tobacco Use  . Smoking status: Never Smoker  . Smokeless tobacco: Never Used  Substance Use Topics  . Alcohol use: Yes    Alcohol/week: 0.0 standard drinks    Comment: social  . Drug use: No     Allergies   Zofran [ondansetron hcl], Droperidol, Flexeril [cyclobenzaprine], Morphine and related, Tessalon [benzonatate], and Voltaren [diclofenac sodium]   Review of Systems Review of Systems  Constitutional: Negative for chills and fever.  HENT: Negative for congestion, postnasal drip, sinus pressure, sinus pain and sore throat.   Eyes: Negative for visual disturbance.  Respiratory: Positive for cough. Negative for shortness of breath.  Cardiovascular: Negative for chest pain.  Gastrointestinal: Negative for abdominal pain, nausea and vomiting.  Genitourinary: Negative for difficulty urinating.  Musculoskeletal: Negative for myalgias.  Skin: Negative for rash.  Neurological: Negative for headaches.     Physical Exam Updated Vital Signs BP 91/61 (BP Location: Right Arm)   Pulse 99   Temp 98.1 F (36.7 C) (Oral)   Resp 20   Ht 5\' 4"  (1.626 m)   Wt 80.7 kg   SpO2 100%   BMI 30.55 kg/m   Physical Exam Vitals signs and nursing note reviewed.  Constitutional:      Appearance: She is not ill-appearing.  HENT:     Head: Normocephalic and atraumatic.  Eyes:     Conjunctiva/sclera: Conjunctivae normal.  Neck:     Musculoskeletal: Neck supple.  Cardiovascular:     Rate and Rhythm: Normal rate and regular rhythm.  Pulmonary:     Effort: Pulmonary effort is normal.     Breath sounds: Normal breath sounds. No wheezing, rhonchi or rales.  Abdominal:     Palpations: Abdomen is soft.     Tenderness: There is no abdominal tenderness.  Skin:    General: Skin is warm and dry.  Neurological:      Mental Status: She is alert.      ED Treatments / Results  Labs (all labs ordered are listed, but only abnormal results are displayed) Labs Reviewed - No data to display  EKG None  Radiology Dg Chest 2 View  Result Date: 03/30/2019 CLINICAL DATA:  Three-day history of cough. Negative COVID-19 testing. Nonsmoker. EXAM: CHEST - 2 VIEW COMPARISON:  07/12/2018 and earlier. FINDINGS: Cardiomediastinal silhouette unremarkable. Lungs clear. Bronchovascular markings normal. Pulmonary vascularity normal. No pneumothorax. No pleural effusions. Visualized bony thorax intact. No interval change. IMPRESSION: Normal and stable examination. Electronically Signed   By: Hulan Saashomas  Lawrence M.D.   On: 03/30/2019 13:02    Procedures Procedures (including critical care time)  Medications Ordered in ED Medications - No data to display   Initial Impression / Assessment and Plan / ED Course  I have reviewed the triage vital signs and the nursing notes.  Pertinent labs & imaging results that were available during my care of the patient were reviewed by me and considered in my medical decision making (see chart for details).    53 year old female presenting to the ED with cough. [facility]CP and [facility] ED in the past couple of days with negative x ray and pending covid 19 test. Both PCP and ED provider did not feel pt needed abx as this is likely viral in nature. Pt reports she typically gets bronchitis twice and year and this feels similar although she states it will typically go away on her own; does not usually get prescribed abx. She is satting 100% on RA and LCTAB. Speaking in full sentences. Occasional coughing. Do not feel patient needs additional work up today. Discussed other medication options with her; it appears she has taking Hycodan cough syrup in the past without issue despite documented allergy to benzonatate and morphine. Will prescribe today and have pt follow up with her PCP. Advised to  continue self isolation until she receives covid 19 test results. Strict return precautions discussed. Pt is in agreement with plan at this time and stable for discharge home.       Final Clinical Impressions(s) / ED Diagnoses   Final diagnoses:  Cough    ED Discharge Orders  Ordered    HYDROcodone-homatropine (HYCODAN) 5-1.5 MG/5ML syrup  Every 6 hours PRN     03/31/19 1011           Eustaquio Maize, PA-C 03/31/19 1847    Hayden Rasmussen, MD 04/01/19 1022

## 2019-04-01 ENCOUNTER — Ambulatory Visit: Payer: Self-pay | Admitting: Physical Therapy

## 2019-04-01 ENCOUNTER — Telehealth: Payer: Self-pay | Admitting: Orthopaedic Surgery

## 2019-04-01 NOTE — Telephone Encounter (Signed)
Please advise 

## 2019-04-01 NOTE — Telephone Encounter (Signed)
Patient left a voicemail stating the inside of her knee is painful when she tries to stand or walk.  Patient states she can hear a "clicking" noise when it moves.  Patient states she has difficulty bending or trying to move it and requesting a return call ASAP.

## 2019-04-02 ENCOUNTER — Emergency Department (HOSPITAL_COMMUNITY): Payer: Self-pay

## 2019-04-02 ENCOUNTER — Emergency Department (HOSPITAL_COMMUNITY)
Admission: EM | Admit: 2019-04-02 | Discharge: 2019-04-02 | Disposition: A | Discharge status: Home / Self Care | Payer: Self-pay | Attending: Emergency Medicine | Admitting: Emergency Medicine

## 2019-04-02 ENCOUNTER — Telehealth: Payer: Self-pay | Admitting: Orthopedic Surgery

## 2019-04-02 ENCOUNTER — Encounter (HOSPITAL_COMMUNITY): Payer: Self-pay

## 2019-04-02 ENCOUNTER — Telehealth: Payer: Self-pay | Admitting: Orthopaedic Surgery

## 2019-04-02 ENCOUNTER — Other Ambulatory Visit: Payer: Self-pay

## 2019-04-02 DIAGNOSIS — Z79899 Other long term (current) drug therapy: Secondary | ICD-10-CM | POA: Insufficient documentation

## 2019-04-02 DIAGNOSIS — M25461 Effusion, right knee: Secondary | ICD-10-CM | POA: Insufficient documentation

## 2019-04-02 MED ORDER — HYDROCODONE-ACETAMINOPHEN 5-325 MG PO TABS
1.0000 | ORAL_TABLET | ORAL | Status: AC
  Administered 2019-04-02: 1 via ORAL
  Filled 2019-04-02: qty 1

## 2019-04-02 NOTE — Telephone Encounter (Signed)
Please advise 

## 2019-04-02 NOTE — Telephone Encounter (Signed)
Spoke to her.  She is using hydrocodone cough syrup which has 5mg /84ml and has probably more than12 doses left.  Told her to use that till test results return

## 2019-04-02 NOTE — Telephone Encounter (Signed)
Has a combination of arthritis and a torn medial meniscus in her knee. I explained to her last week that her recovery from the recent surgery will be prolonged simply because of the arthritis. She can expect popping, clicking, swelling, stiffness and pain mostly medial for months-normal given what was found at surgery. She will improve over time. Has PT scheduled which will help. Please reassure her that what she is feeling is not a problem. Thanks!

## 2019-04-02 NOTE — Telephone Encounter (Signed)
Patient called stating her knee is very painful and is having difficulty standing and walking.  Patient states she is to the point where she needs to use her crutches for ambulation.  Patient wanted to schedule an appointment with Aaron Edelman today so he could "take a look" at her knee.  When asking the COVID prescreening questions, patient stated she went to the ER with "bronchitis type cough" on 03/31/19.  Patient had a COVID test and has not received the results.  Patient was told we cannot schedule an appointment and would have Aaron Edelman return her call after clinic today.  [phone]

## 2019-04-02 NOTE — Discharge Instructions (Addendum)
Continue your current medication, try ice to help with the swelling and discomfort, follow-up with your orthopedic doctor

## 2019-04-02 NOTE — Telephone Encounter (Signed)
I called patient. Patient stated she will go to [facility].

## 2019-04-02 NOTE — Telephone Encounter (Signed)
She needs to go to  ED at [facility] for her knee since she is pending test for Covid

## 2019-04-02 NOTE — ED Provider Notes (Addendum)
[facility] DEPT Provider Note   CSN: [id number] Arrival date & time: 04/02/19  1323    History   Chief Complaint Chief Complaint  Patient presents with  . knee pain post op    HPI Sabrina [name] is a 53 y.o. female.     HPI Patient presents to the ED for persistent right knee pain.  Patient had a meniscus tear and had surgery of her right knee in June.  Patient has been following up with physical therapy.  Patient states in the last few days she started having increasing pain and swelling.  It hurts for her to bend her knee and she feels like it gets caught.  She called her orthopedic doctor to follow-up but she was recently tested for COVID-19 but is still pending.  She was instructed to come to the ED.  Patient denies any fevers or chills.  No recent falls. Past Medical History:  Diagnosis Date  . Arthritis    "knees" (09/06/2017)  . Bullous pemphigus   . Cat bite 06/2014   to left elbow  . History of blood transfusion 1988   "when I had my baby"  . Muscle weakness of lower extremity 2001; 09/05/2017   "resolved after a couple weeks; ?" (09/06/2017)  . 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 Poisoning, snake bite 04/08/2016   "[place]; RUE"  . PONV (postoperative nausea and vomiting)   . Situational anxiety   . Staph infection ~ 2015   "left elbow and finger"    Patient Active Problem List   Diagnosis Date Noted  . 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 Other meniscus derangements, posterior horn of medial meniscus, left knee 02/19/2019  . Bucket handle tear of lateral meniscus 02/19/2019  . Unilateral primary osteoarthritis, right knee 12/13/2018  . Panic disorder 12/13/2018  . Chronic headaches 12/13/2018  . Weakness 09/06/2017  . Depression 09/06/2017  . 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  . Generalized anxiety disorder with panic attacks 09/06/2017  . Right hand pain   . Wrist swelling   . Cellulitis of right upper extremity 04/11/2016  .  Elbow pain 07/20/2015  . Anxiety 07/20/2015  . Tachycardia 07/04/2015  . 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[place])   . Skin ulcer of upper arm, limited to breakdown of skin ([place]) 07/01/2015    Past Surgical History:  Procedure Laterality Date  . APPENDECTOMY  ~ 1987  . APPLICATION OF A-CELL OF EXTREMITY Left 08/05/2015   Procedure: APPLICATION OF A-CELL OF EXTREMITY;  Surgeon: Wallace Going, DO;  Location: [facility];  Service: Plastics;  Laterality: Left;  . BREAST SURGERY Right 1990   "milk duct taken out"  . CHONDROPLASTY Right 02/19/2019   Procedure: CHONDROPLASTY; EXCISION EXOSTOSIS;  Surgeon: Garald Balding, MD;  Location: [facility];  Service: Orthopedics;  Laterality: Right;  . DEBRIDEMENT AND CLOSURE WOUND Left 07/01/2015   Procedure: LEFT ELBOW EXCISION OF WOUND WITH PRIMARY CLOSURE 2X5 CM ;  Surgeon: Wallace Going, DO;  Location: [facility];  Service: Plastics;  Laterality: Left;  . ELBOW SURGERY Left X 23 in [place] <06/2015   from a cat bite; all I&D  . I&D EXTREMITY Left 07/08/2015   Procedure: IRRIGATION AND DEBRIDEMENT EXTREMITY, DRAINAGE OF LEFT ARM WOUND, A-CELL PLACEMENT, WOUND VAC PLACEMENT;  Surgeon: Loel Lofty Dillingham, DO;  Location: [facility];  Service: Plastics;  Laterality: Left;  . INCISION AND DRAINAGE OF WOUND Left 08/05/2015   Procedure: IRRIGATION AND DEBRIDEMENT  LEFT ELBOW WOUND, PLACEMENT OF ACELL;  Surgeon: Peggye Formlaire S Dillingham, DO;  Location: [facility];  Service: Plastics;  Laterality: Left;  . KNEE ARTHROSCOPY WITH MEDIAL MENISECTOMY Right 02/19/2019   Procedure: RIGHT KNEE ARTHROSCOPY, DEBRIDEMENT, PARTIAL MEDIAL AND LATERAL MENISECTOMY;  Surgeon: Valeria BatmanWhitfield, Peter W, MD;  Location: [facility];  Service: Orthopedics;  Laterality: Right;  . LAPAROSCOPIC CHOLECYSTECTOMY  1998  . SKIN GRAFT Left 2016   took from anterior thigh; placed at elbow  . TONSILLECTOMY  ~ 2000  . TOTAL ABDOMINAL  HYSTERECTOMY  2003  . WRIST SURGERY Right 01/2016     OB History   No obstetric history on file.      Home Medications    Prior to Admission medications   Medication Sig Start Date End Date Taking? Authorizing Provider  butalbital-acetaminophen-caffeine (FIORICET) 50-325-40 MG tablet Take 1 tablet by mouth every 6 (six) hours as needed for headache. 02/26/19   Drema DallasJaffe, Adam R, DO  diazepam (VALIUM) 10 MG tablet Take 1 tablet (10 mg) twice a day for one week then, IF NEEDED, increase to 1 tablet (10 mg) in the morning and 2 tablets (20 mg) at bedtime 03/01/19   Plovsky, Earvin HansenGerald, MD  gabapentin (NEURONTIN) 300 MG capsule Take 2 capsules (600 mg total) by mouth 3 (three) times daily. 03/19/19   Nafziger, Kandee Keenory, NP  HYDROcodone-homatropine (HYCODAN) 5-1.5 MG/5ML syrup Take 5 mLs by mouth every 6 (six) hours as needed for cough. 03/31/19   Tanda RockersVenter, Margaux, PA-C  promethazine (PHENERGAN) 25 MG suppository Place 1 suppository (25 mg total) rectally every 6 (six) hours as needed for nausea or vomiting. 03/03/19   Jeannie FendMurphy, Laura A, PA-C  rizatriptan (MAXALT-MLT) 10 MG disintegrating tablet Take 1 tablet earliest onset of migraine.  May repeat in 2 hours if needed.  Maximum 2 tablets in 24hrs 01/23/19   Everlena CooperJaffe, Adam R, DO  solifenacin (VESICARE) 5 MG tablet Take 1 tablet (5 mg total) by mouth daily. 03/27/19   Burchette, Elberta FortisBruce W, MD  traMADol (ULTRAM) 50 MG tablet Take 1 tablet (50 mg total) by mouth 3 (three) times daily as needed. 03/27/19   Valeria BatmanWhitfield, Peter W, MD  venlafaxine XR (EFFEXOR XR) 37.5 MG 24 hr capsule 2 capsules qam 03/01/19   Archer AsaPlovsky, Gerald, MD    Family History Family History  Problem Relation Age of Onset  . Liver disease Mother   . Dementia Mother   . Cirrhosis Mother   . Prostate cancer Father   . Colon cancer Maternal Grandmother   . Ovarian cancer Maternal Aunt     Social History Social History   Tobacco Use  . Smoking status: Never Smoker  . Smokeless tobacco: Never Used   Substance Use Topics  . Alcohol use: Yes    Alcohol/week: 0.0 standard drinks    Comment: social  . Drug use: No     Allergies   Zofran [ondansetron hcl], Droperidol, Flexeril [cyclobenzaprine], Morphine and related, Tessalon [benzonatate], and Voltaren [diclofenac sodium]   Review of Systems Review of Systems  All other systems reviewed and are negative.    Physical Exam Updated Vital Signs BP 117/79 (BP Location: Right Arm)   Pulse (!) 120   Temp 99 F (37.2 C) (Oral)   Resp 18   Ht 1.626 m (5\' 4" )   Wt 80.7 kg   SpO2 98%   BMI 30.55 kg/m   Physical Exam Vitals signs and nursing note reviewed.  Constitutional:      General: She  is not in acute distress.    Appearance: She is well-developed.  HENT:     Head: Normocephalic and atraumatic.     Right Ear: External ear normal.     Left Ear: External ear normal.  Eyes:     General: No scleral icterus.       Right eye: No discharge.        Left eye: No discharge.     Conjunctiva/sclera: Conjunctivae normal.  Neck:     Musculoskeletal: Neck supple.     Trachea: No tracheal deviation.  Cardiovascular:     Rate and Rhythm: Normal rate.  Pulmonary:     Effort: Pulmonary effort is normal. No respiratory distress.     Breath sounds: No stridor.  Abdominal:     General: There is no distension.  Musculoskeletal:        General: No swelling or deformity.     Right knee: She exhibits swelling and effusion. She exhibits no erythema. Tenderness found.  Skin:    General: Skin is warm and dry.     Findings: No rash.  Neurological:     Mental Status: She is alert.     Cranial Nerves: Cranial nerve deficit: no gross deficits.      ED Treatments / Results  Xrays No results found.  Procedures Procedures (including critical care time)  Medications Ordered in ED Medications  HYDROcodone-acetaminophen (NORCO/VICODIN) 5-325 MG per tablet 1 tablet (has no administration in time range)     Initial Impression /  Assessment and Plan / ED Course  I have reviewed the triage vital signs and the nursing notes.  Pertinent labs & imaging results that were available during my care of the patient were reviewed by me and considered in my medical decision making (see chart for details).  Clinical Course as of Apr 04 705  Tue Apr 02, 2019  1447 Patient has a high opiate score on the [employer]  database.  I will defer any opiate prescriptions for that reason.   [JK]    Clinical Course User Index [JK] Linwood DibblesKnapp, Maya Scholer, MD  No signs of infection.  Calf soft non tender.   No signs of DVT.  Effusion noted on exam.  Reviewed ortho notes.  Per Dr Cleophas DunkerWhitfield pt expected to have some recurrent issues with her knee because of her arthritis.  Stable for continues outpatient management.   Pt did get a recent rx for hydrocodone Final Clinical Impressions(s) / ED Diagnoses   Final diagnoses:  Effusion of right knee    ED Discharge Orders    None       Linwood DibblesKnapp, Sahiti Joswick, MD 04/02/19 1447    Linwood DibblesKnapp, Nikhita Mentzel, MD 04/05/19 [phone]

## 2019-04-02 NOTE — Telephone Encounter (Signed)
Patient called stating she had x-rays at [facility] ER which she states were normal.  Patient states she was told that she had a lot of fluid which could be causing the pain.  Patient states they didn't drain her knee.  Patient states they didn't give her anything for the pain and states the Tramadol Dr. Durward Fortes prescribed is not helping.  Patient is requesting a prescription of Oxycodone or Hydrocodone to be sent to [facility] [address] in [place].  Patient states she is staying in [facility] while recovering from her surgery.

## 2019-04-02 NOTE — ED Triage Notes (Signed)
Patient reports that she had right knee surgery on 03/11/19 and states she is now having more pain and swelling of the right knee area than she did prior to and after surgery. Patient states she has been doing physical therapy, but is now unable to bear weight on her right knee. Patient states she had a cough 5 days ago and was prescribed cough medicine which she has not had to use and is awaiting a Covid -19 test . Patient was told to come to the ED instead of the office.

## 2019-04-02 NOTE — Telephone Encounter (Signed)
Please see Dr. Rudene Anda note and call patient. Patient had COVID testing, results pending

## 2019-04-03 ENCOUNTER — Telehealth (HOSPITAL_COMMUNITY): Payer: Self-pay

## 2019-04-03 ENCOUNTER — Telehealth: Payer: Self-pay | Admitting: Orthopaedic Surgery

## 2019-04-03 ENCOUNTER — Other Ambulatory Visit: Payer: Self-pay | Admitting: Orthopedic Surgery

## 2019-04-03 ENCOUNTER — Ambulatory Visit: Payer: Self-pay | Admitting: Physical Therapy

## 2019-04-03 LAB — NOVEL CORONAVIRUS, NAA (HOSP ORDER, SEND-OUT TO REF LAB; TAT 18-24 HRS): SARS-CoV-2, NAA: NOT DETECTED

## 2019-04-03 MED ORDER — OXYCODONE HCL 5 MG PO TABS: 5.0000 mg | ORAL_TABLET | Freq: Four times a day (QID) | ORAL | 0 refills | Status: DC | PRN

## 2019-04-03 MED FILL — GABAPENTIN 300 MG CAPSULE: 300 | 20 days supply | Qty: 120 | Fill #3

## 2019-04-03 NOTE — Telephone Encounter (Signed)
agree

## 2019-04-03 NOTE — Progress Notes (Signed)
Still having effusion and pain.  Relative is terminal and she is on the way to [place], [place].   Hydrocodone not helpful.  Escribed 20 oxy ir 5mg  to a [facility] in [place]

## 2019-04-03 NOTE — Telephone Encounter (Signed)
I called patient 

## 2019-04-03 NOTE — Telephone Encounter (Signed)
Patient calling stating her Smith Robert test has not come back yet. Hopefully will have results tomorrow. Also, patient has been taking the cough medication that has pain medication in it that Aaron Edelman advised her to take without any relief. Tylenol does not help. The swelling that is in her knee has gone to other places now. She is hurting so bad, and at her wits end.

## 2019-04-03 NOTE — Telephone Encounter (Signed)
Ice to knee, advil 600-800mg  tid in addition to cough medicine. Use knee immobilizer given to her previously.may use crutches.check with her primary care physician re other swelling and any other med needs as she did post op

## 2019-04-03 NOTE — Telephone Encounter (Signed)
Thanks

## 2019-04-03 NOTE — Telephone Encounter (Signed)
Please advise 

## 2019-04-03 NOTE — Telephone Encounter (Signed)
Just an FYI, patients father is declining and he is going to be transferred into hospice and then to her sisters home. Patient is traveling to see her father and just wants you to be aware, she states she is okay right now, but she is sure she is going to have a really hard time once she gets there.

## 2019-04-03 NOTE — Telephone Encounter (Signed)
Taken care of!!!  #20 Oxy IR sent to pharmacy in [place], [place] where her relative is terminally ill

## 2019-04-05 MED FILL — VENLAFAXINE HCL ER 37.5 MG: 37.5 | 30 days supply | Qty: 60 | Fill #0

## 2019-04-08 ENCOUNTER — Telehealth: Payer: Self-pay | Admitting: Orthopaedic Surgery

## 2019-04-08 ENCOUNTER — Ambulatory Visit: Payer: Self-pay | Admitting: Physical Therapy

## 2019-04-08 ENCOUNTER — Other Ambulatory Visit: Payer: Self-pay

## 2019-04-08 ENCOUNTER — Other Ambulatory Visit: Payer: Self-pay | Admitting: Orthopedic Surgery

## 2019-04-08 NOTE — Telephone Encounter (Signed)
Patient calling because her knee is really swollen with fluid on it. Patient still in [place], and would like rx for pain medication to be called into the [facility] in [place].

## 2019-04-08 NOTE — Telephone Encounter (Signed)
Please advise 

## 2019-04-09 ENCOUNTER — Other Ambulatory Visit: Payer: Self-pay

## 2019-04-09 ENCOUNTER — Other Ambulatory Visit: Payer: Self-pay | Admitting: Orthopedic Surgery

## 2019-04-09 MED ORDER — HYDROCODONE-ACETAMINOPHEN 5-325 MG PO TABS: 1.0000 | ORAL_TABLET | Freq: Four times a day (QID) | ORAL | 0 refills | Status: DC | PRN

## 2019-04-09 NOTE — Telephone Encounter (Signed)
Brian called

## 2019-04-09 NOTE — Telephone Encounter (Signed)
Aaron Edelman called

## 2019-04-09 NOTE — Telephone Encounter (Signed)
Please advise 

## 2019-04-10 ENCOUNTER — Ambulatory Visit: Payer: Self-pay | Admitting: Physical Therapy

## 2019-04-15 ENCOUNTER — Other Ambulatory Visit: Payer: Self-pay | Admitting: Adult Health

## 2019-04-15 DIAGNOSIS — G629 Polyneuropathy, unspecified: Secondary | ICD-10-CM

## 2019-04-16 ENCOUNTER — Encounter: Payer: Self-pay | Admitting: Internal Medicine

## 2019-04-16 ENCOUNTER — Other Ambulatory Visit: Payer: Self-pay | Admitting: Adult Health

## 2019-04-16 DIAGNOSIS — G629 Polyneuropathy, unspecified: Secondary | ICD-10-CM

## 2019-04-16 MED ORDER — GABAPENTIN 300 MG PO CAPS: 600.0000 mg | ORAL_CAPSULE | Freq: Three times a day (TID) | ORAL | 1 refills | Status: DC

## 2019-04-16 MED FILL — GABAPENTIN 300 MG CAPSULE: 300 | 11 days supply | Qty: 70 | Fill #0

## 2019-04-17 ENCOUNTER — Other Ambulatory Visit: Payer: Self-pay

## 2019-04-18 ENCOUNTER — Encounter: Payer: Self-pay | Admitting: Orthopaedic Surgery

## 2019-04-18 ENCOUNTER — Other Ambulatory Visit: Payer: Self-pay

## 2019-04-21 ENCOUNTER — Other Ambulatory Visit: Payer: Self-pay

## 2019-04-22 ENCOUNTER — Telehealth: Payer: Self-pay | Admitting: Orthopaedic Surgery

## 2019-04-22 ENCOUNTER — Other Ambulatory Visit: Payer: Self-pay

## 2019-04-22 ENCOUNTER — Telehealth (HOSPITAL_COMMUNITY): Payer: Self-pay | Admitting: Psychiatry

## 2019-04-22 NOTE — Telephone Encounter (Signed)
Please see below.

## 2019-04-22 NOTE — Telephone Encounter (Signed)
Patient called requesting prescription refill of Hydrocodone to be [facility] to [facility] [address] at [facility] in [place], [place] .  Patient states she is still in [place] with her dad who is in Hospice care.  Patient states she has not been able to get her knee drained because she doesn't have insurance and it is very expensive.

## 2019-04-23 ENCOUNTER — Telehealth: Payer: Self-pay | Admitting: Orthopaedic Surgery

## 2019-04-23 MED FILL — SOLIFENACIN SUCCINATE 5 MG: 5 | 30 days supply | Qty: 30 | Fill #1

## 2019-04-23 NOTE — Telephone Encounter (Signed)
Returning your call. °

## 2019-04-23 NOTE — Telephone Encounter (Signed)
Sabrina [name] left a voicemail stating she left a message earlier today and is checking if Dr. Durward Fortes will be able to send in prescription of Hydrocodone to [facility].

## 2019-04-23 NOTE — Telephone Encounter (Signed)
thanks

## 2019-04-23 NOTE — Telephone Encounter (Signed)
Please check.

## 2019-04-23 NOTE — Telephone Encounter (Signed)
Patient left a voicemail stating she was returning a call to the office.   °

## 2019-04-23 NOTE — Telephone Encounter (Signed)
Did not answer her phone.  Left message.  Will try again later

## 2019-04-24 NOTE — Telephone Encounter (Signed)
Spoke with patient. She was made aware that it is to early to refill meds.

## 2019-04-25 ENCOUNTER — Ambulatory Visit: Payer: Self-pay | Admitting: Orthopaedic Surgery

## 2019-04-26 ENCOUNTER — Telehealth: Payer: Self-pay | Admitting: Orthopaedic Surgery

## 2019-04-26 ENCOUNTER — Other Ambulatory Visit: Payer: Self-pay

## 2019-04-26 NOTE — Telephone Encounter (Signed)
Called and spoke with patient. Dr.Whitfield will not prescribe anything until Monday.

## 2019-04-26 NOTE — Telephone Encounter (Signed)
Patient left a voicemail stating "Dr. Durward Fortes said he should be able to refill my pain medication at the end of this week and its Friday and I'm just hurting so bad and nothing is helping me."  Patient is requesting a return call today "to let her know if he can fill it."  Patient is requesting the prescription to be sent to [facility] in [place], [place].

## 2019-04-28 ENCOUNTER — Other Ambulatory Visit: Payer: Self-pay

## 2019-04-29 ENCOUNTER — Other Ambulatory Visit: Payer: Self-pay | Admitting: Orthopaedic Surgery

## 2019-04-29 ENCOUNTER — Telehealth: Payer: Self-pay | Admitting: Orthopaedic Surgery

## 2019-04-29 ENCOUNTER — Other Ambulatory Visit: Payer: Self-pay

## 2019-04-29 MED ORDER — HYDROCODONE-ACETAMINOPHEN 5-325 MG PO TABS: 1.0000 | ORAL_TABLET | Freq: Four times a day (QID) | ORAL | 0 refills | Status: DC | PRN

## 2019-04-29 MED FILL — GABAPENTIN 300 MG CAPSULE: 300 | 11 days supply | Qty: 70 | Fill #1

## 2019-04-29 NOTE — Telephone Encounter (Signed)
Please advise 

## 2019-04-29 NOTE — Telephone Encounter (Signed)
Patient request refill on Hydrocodone sent to [facility] in [place].

## 2019-04-29 NOTE — Telephone Encounter (Signed)
sent 

## 2019-04-29 NOTE — Telephone Encounter (Signed)
Called patient. No answer. Left message that her pain medicine has been sent to her pharmacy.

## 2019-04-30 ENCOUNTER — Ambulatory Visit: Payer: Self-pay

## 2019-04-30 NOTE — Telephone Encounter (Signed)
Pt. Reports she noticed swelling to both legs last Thursday. Swelling goes up to her shins. No redness. Has pain - 8/10. Tried Hydrocodone she had from her knee surgery, but it doesn't help. Has pain in  Both hips as well. Reports she has recently started Vesicare. Stopped it this weekend.Denies any chest pain or shortness of breath. Has an appointment tomorrow. Checked with Tammy in the practice, no openings today. Instructed if symptoms worsen before appointment , go to ED. Verbalizes understanding.  Answer Assessment - Initial Assessment Questions 1. ONSET: "When did the swelling start?" (e.g., minutes, hours, days)     Started last Thursday 2. LOCATION: "What part of the leg is swollen?"  "Are both legs swollen or just one leg?"     Both legs 3. SEVERITY: "How bad is the swelling?" (e.g., localized; mild, moderate, severe)  - Localized - small area of swelling localized to one leg  - MILD pedal edema - swelling limited to foot and ankle, pitting edema < 1/4 inch (6 mm) deep, rest and elevation eliminate most or all swelling  - MODERATE edema - swelling of lower leg to knee, pitting edema > 1/4 inch (6 mm) deep, rest and elevation only partially reduce swelling  - SEVERE edema - swelling extends above knee, facial or hand swelling present      Moderate 4. REDNESS: "Does the swelling look red or infected?"     No 5. PAIN: "Is the swelling painful to touch?" If so, ask: "How painful is it?"   (Scale 1-10; mild, moderate or severe)     8 6. FEVER: "Do you have a fever?" If so, ask: "What is it, how was it measured, and when did it start?"      No 7. CAUSE: "What do you think is causing the leg swelling?"     Maybe the Vesicare 8. MEDICAL HISTORY: "Do you have a history of heart failure, kidney disease, liver failure, or cancer?"     No 9. RECURRENT SYMPTOM: "Have you had leg swelling before?" If so, ask: "When was the last time?" "What happened that time?"     No 10. OTHER SYMPTOMS: "Do you  have any other symptoms?" (e.g., chest pain, difficulty breathing)      No 11. PREGNANCY: "Is there any chance you are pregnant?" "When was your last menstrual period?"       No  Protocols used: LEG SWELLING AND EDEMA-A-AH

## 2019-05-01 ENCOUNTER — Other Ambulatory Visit: Payer: Self-pay

## 2019-05-01 ENCOUNTER — Telehealth (INDEPENDENT_AMBULATORY_CARE_PROVIDER_SITE_OTHER): Payer: Self-pay | Admitting: Internal Medicine

## 2019-05-01 DIAGNOSIS — R600 Localized edema: Secondary | ICD-10-CM

## 2019-05-01 NOTE — Progress Notes (Signed)
Virtual Visit via Video Note  I connected with [name] on 05/01/19 at  1:30 PM EDT by a video enabled telemedicine application and verified that I am speaking with the correct person using two identifiers.  Location patient: home Location provider: work office Persons participating in the virtual visit: patient, provider  I discussed the limitations of evaluation and management by telemedicine and the availability of in person appointments. The patient expressed understanding and agreed to proceed.   HPI: She has scheduled this acute visit to discuss bilateral lower extremity edema.  She is currently in [place] taking care of her father who is now in home hospice.  About a month ago she saw another provider in the office for an overactive bladder and was prescribed Vesicare.  She has noticed that ever since then she has had significant bilateral lower extremity edema.  Discussed with her pharmacist we did state that this can sometimes cause edema and she has since quit taking it about a week ago.  She has not noticed any difference in her leg edema.  She does not have chest pain or shortness of breath.  She is no longer on prednisone.  She may be eating more salty foods she states that she is relying mainly on fast food while she is with her father.  She has not had a chance to do much in the way of physical activity.   ROS: Constitutional: Denies fever, chills, diaphoresis, appetite change and fatigue.  HEENT: Denies photophobia, eye pain, redness, hearing loss, ear pain, congestion, sore throat, rhinorrhea, sneezing, mouth sores, trouble swallowing, neck pain, neck stiffness and tinnitus.   Respiratory: Denies SOB, DOE, cough, chest tightness,  and wheezing.   Cardiovascular: Denies chest pain, palpitations and leg swelling.  Gastrointestinal: Denies nausea, vomiting, abdominal pain, diarrhea, constipation, blood in stool and abdominal distention.  Genitourinary: Denies dysuria,  urgency, frequency, hematuria, flank pain and difficulty urinating.  Endocrine: Denies: hot or cold intolerance, sweats, changes in hair or nails, polyuria, polydipsia. Musculoskeletal: Denies myalgias, back pain, joint swelling, arthralgias and gait problem.  Skin: Denies pallor, rash and wound.  Neurological: Denies dizziness, seizures, syncope, weakness, light-headedness, numbness and headaches.  Hematological: Denies adenopathy. Easy bruising, personal or family bleeding history  Psychiatric/Behavioral: Denies suicidal ideation, mood changes, confusion, nervousness, sleep disturbance and agitation   Past Medical History:  Diagnosis Date  . Arthritis    "knees" (09/06/2017)  . Bullous pemphigus   . Cat bite 06/2014   to left elbow  . History of blood transfusion 1988   "when I had my baby"  . Muscle weakness of lower extremity 2001; 09/05/2017   "resolved after a couple weeks; ?" (09/06/2017)  . 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 Poisoning, snake bite 04/08/2016   "[place]; RUE"  . PONV (postoperative nausea and vomiting)   . Situational anxiety   . Staph infection ~ 2015   "left elbow and finger"    Past Surgical History:  Procedure Laterality Date  . APPENDECTOMY  ~ 1987  . APPLICATION OF A-CELL OF EXTREMITY Left 08/05/2015   Procedure: APPLICATION OF A-CELL OF EXTREMITY;  Surgeon: Wallace Going, DO;  Location: [facility];  Service: Plastics;  Laterality: Left;  . BREAST SURGERY Right 1990   "milk duct taken out"  . CHONDROPLASTY Right 02/19/2019   Procedure: CHONDROPLASTY; EXCISION EXOSTOSIS;  Surgeon: Garald Balding, MD;  Location: [facility];  Service: Orthopedics;  Laterality: Right;  . DEBRIDEMENT  AND CLOSURE WOUND Left 07/01/2015   Procedure: LEFT ELBOW EXCISION OF WOUND WITH PRIMARY CLOSURE 2X5 CM ;  Surgeon: Peggye Formlaire S Dillingham, DO;  Location: [facility];  Service: Plastics;  Laterality: Left;  . ELBOW SURGERY  Left X 23 in [place] <06/2015   from a cat bite; all I&D  . I&D EXTREMITY Left 07/08/2015   Procedure: IRRIGATION AND DEBRIDEMENT EXTREMITY, DRAINAGE OF LEFT ARM WOUND, A-CELL PLACEMENT, WOUND VAC PLACEMENT;  Surgeon: Alena Billslaire S Dillingham, DO;  Location: [facility];  Service: Plastics;  Laterality: Left;  . INCISION AND DRAINAGE OF WOUND Left 08/05/2015   Procedure: IRRIGATION AND DEBRIDEMENT LEFT ELBOW WOUND, PLACEMENT OF ACELL;  Surgeon: Peggye Formlaire S Dillingham, DO;  Location: [facility];  Service: Plastics;  Laterality: Left;  . KNEE ARTHROSCOPY WITH MEDIAL MENISECTOMY Right 02/19/2019   Procedure: RIGHT KNEE ARTHROSCOPY, DEBRIDEMENT, PARTIAL MEDIAL AND LATERAL MENISECTOMY;  Surgeon: Valeria BatmanWhitfield, Peter W, MD;  Location: [facility];  Service: Orthopedics;  Laterality: Right;  . LAPAROSCOPIC CHOLECYSTECTOMY  1998  . SKIN GRAFT Left 2016   took from anterior thigh; placed at elbow  . TONSILLECTOMY  ~ 2000  . TOTAL ABDOMINAL HYSTERECTOMY  2003  . WRIST SURGERY Right 01/2016    Family History  Problem Relation Age of Onset  . Liver disease Mother   . Dementia Mother   . Cirrhosis Mother   . Prostate cancer Father   . Colon cancer Maternal Grandmother   . Ovarian cancer Maternal Aunt     SOCIAL HX:   reports that she has never smoked. She has never used smokeless tobacco. She reports current alcohol use. She reports that she does not use drugs.   Current Outpatient Medications:  .  butalbital-acetaminophen-caffeine (FIORICET) 50-325-40 MG tablet, Take 1 tablet by mouth every 6 (six) hours as needed for headache., Disp: 10 tablet, Rfl: 3 .  diazepam (VALIUM) 10 MG tablet, Take 1 tablet (10 mg) twice a day for one week then, IF NEEDED, increase to 1 tablet (10 mg) in the morning and 2 tablets (20 mg) at bedtime, Disp: 90 tablet, Rfl: 3 .  gabapentin (NEURONTIN) 300 MG capsule, Take 2 capsules (600 mg total) by mouth 3 (three) times daily., Disp: 180 capsule, Rfl: 1 .   HYDROcodone-acetaminophen (NORCO/VICODIN) 5-325 MG tablet, Take 1 tablet by mouth every 6 (six) hours as needed for moderate pain., Disp: 30 tablet, Rfl: 0 .  HYDROcodone-acetaminophen (NORCO/VICODIN) 5-325 MG tablet, Take 1 tablet by mouth every 6 (six) hours as needed for moderate pain., Disp: 30 tablet, Rfl: 0 .  HYDROcodone-homatropine (HYCODAN) 5-1.5 MG/5ML syrup, Take 5 mLs by mouth every 6 (six) hours as needed for cough., Disp: 120 mL, Rfl: 0 .  oxyCODONE (ROXICODONE) 5 MG immediate release tablet, Take 1 tablet (5 mg total) by mouth every 6 (six) hours as needed., Disp: 20 tablet, Rfl: 0 .  promethazine (PHENERGAN) 25 MG suppository, Place 1 suppository (25 mg total) rectally every 6 (six) hours as needed for nausea or vomiting., Disp: 6 each, Rfl: 0 .  rizatriptan (MAXALT-MLT) 10 MG disintegrating tablet, Take 1 tablet earliest onset of migraine.  May repeat in 2 hours if needed.  Maximum 2 tablets in 24hrs, Disp: 9 tablet, Rfl: 3 .  solifenacin (VESICARE) 5 MG tablet, Take 1 tablet (5 mg total) by mouth daily., Disp: 30 tablet, Rfl: 5 .  traMADol (ULTRAM) 50 MG tablet, Take 1 tablet (50 mg total) by mouth 3 (three) times daily as needed.,  Disp: 30 tablet, Rfl: 0 .  venlafaxine XR (EFFEXOR XR) 37.5 MG 24 hr capsule, 2 capsules qam, Disp: 60 capsule, Rfl: 5  EXAM:   VITALS per patient if applicable: None reported  GENERAL: alert, oriented, appears well and in no acute distress  HEENT: atraumatic, conjunttiva clear, no obvious abnormalities on inspection of external nose and ears  NECK: normal movements of the head and neck  LUNGS: on inspection no signs of respiratory distress, breathing rate appears normal, no obvious gross increased work of breathing, gasping or wheezing  CV: no obvious cyanosis  MS: moves all visible extremities significant lower extremity edema has been noted on exam that is prominent over her ankles, dorsum of the foot all the way up to her knees bilaterally.   PSYCH/NEURO: pleasant and cooperative, no obvious depression or anxiety, speech and thought processing grossly intact  ASSESSMENT AND PLAN:   Bilateral leg edema -Potential multiple etiologies including venous insufficiency, hypoalbuminemia, increased salt load and diet, renal and liver disorders, possible CHF. -For now have advised low-salt diet, leg elevation, compression stockings, frequent mobilization. -She has been advised to notify our office when she returns to [place] to have her set up for lab work and possibly a 2D echo.    I discussed the assessment and treatment plan with the patient. The patient was provided an opportunity to ask questions and all were answered. The patient agreed with the plan and demonstrated an understanding of the instructions.   The patient was advised to call back or seek an in-person evaluation if the symptoms worsen or if the condition fails to improve as anticipated.    Chaya JanEstela Hernandez Acosta, MD  [facility]

## 2019-05-02 ENCOUNTER — Ambulatory Visit: Payer: Self-pay | Admitting: Internal Medicine

## 2019-05-03 ENCOUNTER — Telehealth: Payer: Self-pay | Admitting: Internal Medicine

## 2019-05-03 ENCOUNTER — Ambulatory Visit: Payer: Self-pay | Admitting: *Deleted

## 2019-05-03 NOTE — Telephone Encounter (Signed)
Unfortunately, I am unable to prescribe controlled substances over state lines. In addition, if pain is that severe she needs an in-person evaluation. She may need to seek care locally.

## 2019-05-03 NOTE — Telephone Encounter (Signed)
See note

## 2019-05-03 NOTE — Telephone Encounter (Signed)
Patient is aware 

## 2019-05-03 NOTE — Telephone Encounter (Signed)
Pt called in from [name].   She is there with her father who is passing away.  She requested Dr. Jerilee Hoh or whoever to call her back because she needed to suction her father and she's the only one that does that.   I let her know someone would call her back.   [phone].  She did a virtual visit with Dr. Jerilee Hoh 2 days ago due to the swelling in her legs.  Today her ankles are more swollen.   The main thing is she is having terrible pain in both of her ankles, calves, and hips.   She has taken 2 hydrocodone pills left over from her recent knee surgery however they have not helped.     [place] in [place] in [place] where [place].   She does not have insurance so the urgent care won't see her.   She has friends that work in the little hospital there and they have advised her not to come there to the ED because they are over run with COVID pts.   They even have tents set up outside to accommodate all the pts.    She is not going there plus she don't want to be exposed to [place].  She doesn't know what to do and the pain is terrible.   She would appreciate a call back.    Thanks.  I sent this high priority to Dr. Jerilee Hoh.  Reason for Disposition . [1] MODERATE leg swelling (e.g., swelling extends up to knees) AND [2] new onset or worsening    Seen 2 days ago by Dr. Jerilee Hoh for this same problem with the swelling.  Answer Assessment - Initial Assessment Questions 1. ONSET: "When did the swelling start?" (e.g., minutes, hours, days)     I saw Dr Jerilee Hoh 2 days ago for the swelling in my legs. 2. LOCATION: "What part of the leg is swollen?"  "Are both legs swollen or just one leg?"     Both of my legs are swollen but now I'm having terrible pain in my ankles, hips and calves.   I'm taking the hydrocodone from a recent knee surgery however it's not helping much.  I'm in [name] now with my father who is passing away.     Go to documentation notes. 3. SEVERITY: "How bad is the swelling?" (e.g.,  localized; mild, moderate, severe)  - Localized - small area of swelling localized to one leg  - MILD pedal edema - swelling limited to foot and ankle, pitting edema < 1/4 inch (6 mm) deep, rest and elevation eliminate most or all swelling  - MODERATE edema - swelling of lower leg to knee, pitting edema > 1/4 inch (6 mm) deep, rest and elevation only partially reduce swelling  - SEVERE edema - swelling extends above knee, facial or hand swelling present      *No Answer* 4. REDNESS: "Does the swelling look red or infected?"     *No Answer* 5. PAIN: "Is the swelling painful to touch?" If so, ask: "How painful is it?"   (Scale 1-10; mild, moderate or severe)     *No Answer* 6. FEVER: "Do you have a fever?" If so, ask: "What is it, how was it measured, and when did it start?"      *No Answer* 7. CAUSE: "What do you think is causing the leg swelling?"     *No Answer* 8. MEDICAL HISTORY: "Do you have a history of heart failure, kidney  disease, liver failure, or cancer?"     *No Answer* 9. RECURRENT SYMPTOM: "Have you had leg swelling before?" If so, ask: "When was the last time?" "What happened that time?"     *No Answer* 10. OTHER SYMPTOMS: "Do you have any other symptoms?" (e.g., chest pain, difficulty breathing)       *No Answer* 11. PREGNANCY: "Is there any chance you are pregnant?" "When was your last menstrual period?"       *No Answer*  Protocols used: LEG SWELLING AND EDEMA-A-AH

## 2019-05-06 MED FILL — GABAPENTIN 300 MG CAPSULE: 300 | 30 days supply | Qty: 180 | Fill #2

## 2019-05-12 ENCOUNTER — Other Ambulatory Visit: Payer: Self-pay | Admitting: Orthopaedic Surgery

## 2019-05-13 ENCOUNTER — Telehealth: Payer: Self-pay | Admitting: Orthopaedic Surgery

## 2019-05-13 NOTE — Telephone Encounter (Signed)
Called patient. No answer. Left message that Dr.Whitfield is not going to refill her hydrocodone until she is able to come back for an office visit.

## 2019-05-22 ENCOUNTER — Other Ambulatory Visit: Payer: Self-pay | Admitting: Internal Medicine

## 2019-05-22 ENCOUNTER — Other Ambulatory Visit: Payer: Self-pay | Admitting: Neurology

## 2019-05-22 NOTE — Telephone Encounter (Signed)
Pt is experiencing Pain and was released by the provider that did her knee surgery. Pt would like a refill for HYDROcodone-acetaminophen (NORCO/VICODIN) 5-325 MG tablet  The provider that did the knee surgery advised Pt to ask PCP about pain medication/ please advise    [place], [place] [facility] [place] [phone] (Phone) [phone] (Fax)

## 2019-05-23 ENCOUNTER — Encounter: Payer: Self-pay | Admitting: Internal Medicine

## 2019-05-23 ENCOUNTER — Telehealth: Payer: Self-pay | Admitting: *Deleted

## 2019-05-23 ENCOUNTER — Encounter: Payer: Self-pay | Admitting: Neurology

## 2019-05-23 MED FILL — BUTALBITAL-APAP-CAFFEINE 50: 50-325-40 | 2 days supply | Qty: 10 | Fill #0

## 2019-05-23 NOTE — Telephone Encounter (Signed)
Called patient and verified she wanted to Fioricet script faxed to [facility] outpatient pharmacy. She does and will fax to them now.  Also updated patient's chart prior to virtual visit with Dr. Tomi Likens on Monday am.  She wanted to let him know the following info ( documented in Epic) about bilateral leg swelling from the knees down around the time she started on vesicare for her bladder. She did have a virtual visit  On 8/19with her PCP Dr. Isaac Bliss regarding this. Swelling is better but she wanted Dr. Tomi Likens to know this ahead of the virtual visit on Monday.  Dr Tomi Likens FYI above.

## 2019-05-23 NOTE — Telephone Encounter (Signed)
Mychart message sent to patient - PCP not in office.

## 2019-05-24 ENCOUNTER — Encounter: Payer: Self-pay | Admitting: Family Medicine

## 2019-05-24 ENCOUNTER — Telehealth (INDEPENDENT_AMBULATORY_CARE_PROVIDER_SITE_OTHER): Payer: Self-pay | Admitting: Family Medicine

## 2019-05-24 ENCOUNTER — Other Ambulatory Visit: Payer: Self-pay

## 2019-05-24 DIAGNOSIS — G8929 Other chronic pain: Secondary | ICD-10-CM

## 2019-05-24 DIAGNOSIS — M25561 Pain in right knee: Secondary | ICD-10-CM

## 2019-05-24 MED ORDER — HYDROCODONE-ACETAMINOPHEN 5-325 MG PO TABS: 1.0000 | ORAL_TABLET | ORAL | 0 refills | Status: AC | PRN

## 2019-05-24 MED FILL — HYDROCODON-APAP 5-325: 5-325 | 5 days supply | Qty: 30 | Fill #0

## 2019-05-24 NOTE — Progress Notes (Signed)
Virtual Visit via Video Note  I connected with the patient on 05/24/19 at  8:00 AM EDT by a video enabled telemedicine application and verified that I am speaking with the correct person using two identifiers.  Location patient: home Location provider:work or home office Persons participating in the virtual visit: patient, provider  I discussed the limitations of evaluation and management by telemedicine and the availability of in person appointments. The patient expressed understanding and agreed to proceed.   HPI: Here to discuss severe and chronic right knee pain. She had arthroscopic surgery on th eknee per Dr. Norlene CampbellPeter Whitfield on 02-19-19, but she has dealt with the pain ever since. She has been to PT with no improvement. She uses ice and Tylenol and wears a brace with little relief. 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 She asks for a referral to another orthopedist for a second opinion. She wants to have a total replacement done but Dr. Cleophas DunkerWhitfield says she is too young for this, and she is frustrated.    ROS: See pertinent positives and negatives per HPI.  Past Medical History:  Diagnosis Date  . Arthritis    "knees" (09/06/2017)  . Bullous pemphigus   . Cat bite 06/2014   to left elbow  . History of blood transfusion 1988   "when I had my baby"  . Muscle weakness of lower extremity 2001; 09/05/2017   "resolved after a couple weeks; ?" (09/06/2017)  . 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 Poisoning, snake bite 04/08/2016   "[place]; RUE"  . PONV (postoperative nausea and vomiting)   . Situational anxiety   . Staph infection ~ 2015   "left elbow and finger"    Past Surgical History:  Procedure Laterality Date  . APPENDECTOMY  ~ 1987  . APPLICATION OF A-CELL OF EXTREMITY Left 08/05/2015   Procedure: APPLICATION OF A-CELL OF EXTREMITY;  Surgeon: Peggye Formlaire S Dillingham, DO;  Location: [facility];   Service: Plastics;  Laterality: Left;  . BREAST SURGERY Right 1990   "milk duct taken out"  . CHONDROPLASTY Right 02/19/2019   Procedure: CHONDROPLASTY; EXCISION EXOSTOSIS;  Surgeon: Valeria BatmanWhitfield, Peter W, MD;  Location: [facility];  Service: Orthopedics;  Laterality: Right;  . DEBRIDEMENT AND CLOSURE WOUND Left 07/01/2015   Procedure: LEFT ELBOW EXCISION OF WOUND WITH PRIMARY CLOSURE 2X5 CM ;  Surgeon: Peggye Formlaire S Dillingham, DO;  Location: [facility];  Service: Plastics;  Laterality: Left;  . ELBOW SURGERY Left X 23 in [place] <06/2015   from a cat bite; all I&D  . I&D EXTREMITY Left 07/08/2015   Procedure: IRRIGATION AND DEBRIDEMENT EXTREMITY, DRAINAGE OF LEFT ARM WOUND, A-CELL PLACEMENT, WOUND VAC PLACEMENT;  Surgeon: Alena Billslaire S Dillingham, DO;  Location: [facility];  Service: Plastics;  Laterality: Left;  . INCISION AND DRAINAGE OF WOUND Left 08/05/2015   Procedure: IRRIGATION AND DEBRIDEMENT LEFT ELBOW WOUND, PLACEMENT OF ACELL;  Surgeon: Peggye Formlaire S Dillingham, DO;  Location: [facility];  Service: Plastics;  Laterality: Left;  . KNEE ARTHROSCOPY WITH MEDIAL MENISECTOMY Right 02/19/2019   Procedure: RIGHT KNEE ARTHROSCOPY, DEBRIDEMENT, PARTIAL MEDIAL AND LATERAL MENISECTOMY;  Surgeon: Valeria BatmanWhitfield, Peter W, MD;  Location: [facility];  Service: Orthopedics;  Laterality: Right;  . LAPAROSCOPIC CHOLECYSTECTOMY  1998  . SKIN GRAFT Left 2016   took from anterior thigh; placed at elbow  . TONSILLECTOMY  ~ 2000  . TOTAL ABDOMINAL  HYSTERECTOMY  2003  . WRIST SURGERY Right 01/2016    Family History  Problem Relation Age of Onset  . Liver disease Mother   . Dementia Mother   . Cirrhosis Mother   . Prostate cancer Father   . Colon cancer Maternal Grandmother   . Ovarian cancer Maternal Aunt      Current Outpatient Medications:  .  butalbital-acetaminophen-caffeine (FIORICET) 50-325-40 MG tablet, TAKE 1 TABLET BY MOUTH EVERY 6 HOURS AS NEEDED FOR  HEADACHE, Disp: 10 tablet, Rfl: 3 .  diazepam (VALIUM) 10 MG tablet, Take 1 tablet (10 mg) twice a day for one week then, IF NEEDED, increase to 1 tablet (10 mg) in the morning and 2 tablets (20 mg) at bedtime, Disp: 90 tablet, Rfl: 3 .  gabapentin (NEURONTIN) 300 MG capsule, Take 2 capsules (600 mg total) by mouth 3 (three) times daily., Disp: 180 capsule, Rfl: 1 .  HYDROcodone-acetaminophen (NORCO) 5-325 MG tablet, Take 1 tablet by mouth every 4 (four) hours as needed for up to 5 days for moderate pain., Disp: 30 tablet, Rfl: 0 .  solifenacin (VESICARE) 5 MG tablet, Take 1 tablet (5 mg total) by mouth daily., Disp: 30 tablet, Rfl: 5 .  venlafaxine XR (EFFEXOR XR) 37.5 MG 24 hr capsule, 2 capsules qam, Disp: 60 capsule, Rfl: 5  EXAM:  VITALS per patient if applicable:  GENERAL: alert, oriented, appears well and in no acute distress  HEENT: atraumatic, conjunttiva clear, no obvious abnormalities on inspection of external nose and ears  NECK: normal movements of the head and neck  LUNGS: on inspection no signs of respiratory distress, breathing rate appears normal, no obvious gross SOB, gasping or wheezing  CV: no obvious cyanosis  MS: moves all visible extremities without noticeable abnormality  PSYCH/NEURO: pleasant and cooperative, no obvious depression or anxiety, speech and thought processing grossly intact  ASSESSMENT AND PLAN: Chronic right knee pain. I will send in a small supply of Norco to help with the pain, but I told her she would need to get such a referral from Dr. Jerilee Hoh, her PCP. She understands and plans to talk with her next week.  Alysia Penna, MD  Discussed the following assessment and plan:  No diagnosis found.     I discussed the assessment and treatment plan with the patient. The patient was provided an opportunity to ask questions and all were answered. The patient agreed with the plan and demonstrated an understanding of the instructions.   The patient  was advised to call back or seek an in-person evaluation if the symptoms worsen or if the condition fails to improve as anticipated.

## 2019-05-26 NOTE — Progress Notes (Addendum)
Virtual Visit via Video Note The purpose of this virtual visit is to provide medical care while limiting exposure to the novel coronavirus.    Consent was obtained for video visit:  Yes Answered questions that patient had about telehealth interaction:  Yes I discussed the limitations, risks, security and privacy concerns of performing an evaluation and management service by telemedicine. I also discussed with the patient that there may be a patient responsible charge related to this service. The patient expressed understanding and agreed to proceed.  Pt location: Home Physician Location: Home Name of referring provider:  Philip AspenHernandez Acosta, Almira BarEstel* I connected with Sabrina Jabsana L Matzke at patients initiation/request on 05/27/2019 at  9:50 AM EDT by video enabled telemedicine application and verified that I am speaking with the correct person using two identifiers. Pt MRN:  [id number] Pt DOB:  12/16/1965 Video Participants:  Sabrina [name]   History of Present Illness:  Sabrina LabradorDana Biehler is a 53 year old Caucasian woman with bullous pemphigoid and anxiety with panic disorder who follows up for migraines and tension-type headaches.  UPDATE: Rizatriptan ineffective. As headaches infrequent, she was prescribed Fioricet as it was previously effective.   She has had increased headaches due to emotional stress.  She is staying down in [place] to care for her father who is on hospice care. Intensity:  8/10 Duration:  1 day Frequency:  2 days a week Frequency of abortive medication: 2 days a week Current NSAIDS:  none Current analgesics:  Fioricet Current triptans:  none Current ergotamine:  none Current anti-emetic:  promethazine 25mg   Current muscle relaxants:  none Current anti-anxiolytic:  Valium Current sleep aide:  none Current Antihypertensive medications:  none Current Antidepressant medications:  Venlafaxine XR 37.5mg  daily  Current Anticonvulsant medications:  Gabapentin 600mg  three times  daily Current anti-CGRP:  none Current Vitamins/Herbal/Supplements:  D Current Antihistamines/Decongestants:  none Other therapy:  none Hormone/birth control:  none  Caffeine:  No coffee or colas Diet:  Hydrates with water.  Smooties Exercise:  Tries to walk but has trouble with her knees.  Likes to ride her horse but first needs knee surgery Depression/Anxiety:  Significant anxiety with panic attacks.  Increased anxiety as father recently had a stroke in [place] and she was unable to visit him due to COVID-19.  She also has been struggling   Stress related to increased pain due to the bullous pemphigoid Other pain:  pain due to the bullous pemphigoid.  Bilateral knee pain Sleep hygiene:  poor  HISTORY: Onset:  Her late-20s Location:  Over left eyebrow Quality:  Stabbing, pounding, pressure Initial intensity:  8-9/10.  She denies new headache, thunderclap headache Aura:  No Premonitory Phase:  No Postdrome:  no Associated symptoms:  Nausea, vomiting.  She denies associated photophobia, phonophobia, unilateral numbness or weakness. Initial Duration:  Usually 1 day.  Longest lasted 3 days. Initial Frequency:  3 to 4 days a month but more frequently lately due to increased stress. Initial Frequency of abortive medication: Fioricet 1-2 days a week.  Excedrin most days a week (for tension-type headache) Triggers:  Emotional stress Relieving factors:  Laying down Activity:  Aggravated when sitting up or standing upright She also has tension type headaches which are mild to moderate bifrontal pressure, almost daily  Past NSAIDS:  naproxen Past analgesics:  tramadol, Excedrin, Goody powder, Tylenol Past abortive triptans:  rizatriptan 10mg  Past abortive ergotamine:  none Past muscle relaxants:  none Past anti-emetic:  Compazine 10mg , Phenergan, Zofran (allergy) Past antihypertensive medications:  none Past antidepressant medications:  Lexapro, Paxil, Zoloft Past anticonvulsant  medications:  Topiramate 25mg  twice daily (on for one month) Past anti-CGRP:  none Past vitamins/Herbal/Supplements:  none Past antihistamines/decongestants:  none Other past therapies:  none  Family history of headache:  Father (migraines)  Past Medical History: Past Medical History:  Diagnosis Date  . Arthritis    "knees" (09/06/2017)  . Bullous pemphigus   . Cat bite 06/2014   to left elbow  . History of blood transfusion 1988   "when I had my baby"  . Muscle weakness of lower extremity 2001; 09/05/2017   "resolved after a couple weeks; ?" (09/06/2017)  . 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 Poisoning, snake bite 04/08/2016   "[place]; RUE"  . PONV (postoperative nausea and vomiting)   . Situational anxiety   . Staph infection ~ 2015   "left elbow and finger"    Medications: Outpatient Encounter Medications as of 05/27/2019  Medication Sig  . butalbital-acetaminophen-caffeine (FIORICET) 50-325-40 MG tablet TAKE 1 TABLET BY MOUTH EVERY 6 HOURS AS NEEDED FOR HEADACHE  . diazepam (VALIUM) 10 MG tablet Take 1 tablet (10 mg) twice a day for one week then, IF NEEDED, increase to 1 tablet (10 mg) in the morning and 2 tablets (20 mg) at bedtime  . gabapentin (NEURONTIN) 300 MG capsule Take 2 capsules (600 mg total) by mouth 3 (three) times daily.  . solifenacin (VESICARE) 5 MG tablet Take 1 tablet (5 mg total) by mouth daily.  Marland Kitchen venlafaxine XR (EFFEXOR XR) 37.5 MG 24 hr capsule 2 capsules qam  . [DISCONTINUED] HYDROcodone-acetaminophen (NORCO/VICODIN) 5-325 MG tablet Take 1 tablet by mouth every 6 (six) hours as needed for moderate pain.  . [DISCONTINUED] HYDROcodone-acetaminophen (NORCO/VICODIN) 5-325 MG tablet Take 1 tablet by mouth every 6 (six) hours as needed for moderate pain.  . [DISCONTINUED] HYDROcodone-homatropine (HYCODAN) 5-1.5 MG/5ML syrup Take 5 mLs by mouth every 6 (six) hours as needed for cough.  . [DISCONTINUED] oxyCODONE (ROXICODONE) 5 MG immediate release tablet  Take 1 tablet (5 mg total) by mouth every 6 (six) hours as needed.  . [DISCONTINUED] promethazine (PHENERGAN) 25 MG suppository Place 1 suppository (25 mg total) rectally every 6 (six) hours as needed for nausea or vomiting.  . [DISCONTINUED] rizatriptan (MAXALT-MLT) 10 MG disintegrating tablet Take 1 tablet earliest onset of migraine.  May repeat in 2 hours if needed.  Maximum 2 tablets in 24hrs  . [DISCONTINUED] traMADol (ULTRAM) 50 MG tablet Take 1 tablet (50 mg total) by mouth 3 (three) times daily as needed.   No facility-administered encounter medications on file as of 05/27/2019.     Allergies: Allergies  Allergen Reactions  . Zofran [Ondansetron Hcl] Hives and Other (See Comments)    Spots around iv site  . Droperidol Palpitations  . Flexeril [Cyclobenzaprine] Anxiety  . Morphine And Related Itching  . Tessalon [Benzonatate] Other (See Comments)    Watery eyes  . Voltaren [Diclofenac Sodium] Rash    Family History: Family History  Problem Relation Age of Onset  . Liver disease Mother   . Dementia Mother   . Cirrhosis Mother   . Prostate cancer Father   . Colon cancer Maternal Grandmother   . Ovarian cancer Maternal Aunt     Social History: Social History   Socioeconomic History  . Marital status: Divorced    Spouse name: Not on file  . Number of children: 1  . Years of education: Not on file  . Highest education level:  Associate degree: academic program  Occupational History  . Occupation: unemployed  Social Needs  . Financial resource strain: Not on file  . Food insecurity    Worry: Not on file    Inability: Not on file  . Transportation needs    Medical: Not on file    Non-medical: Not on file  Tobacco Use  . Smoking status: Never Smoker  . Smokeless tobacco: Never Used  Substance and Sexual Activity  . Alcohol use: Yes    Alcohol/week: 0.0 standard drinks    Comment: social  . Drug use: No  . Sexual activity: Not Currently  Lifestyle  . Physical  activity    Days per week: Not on file    Minutes per session: Not on file  . Stress: Not on file  Relationships  . Social Musician on phone: Not on file    Gets together: Not on file    Attends religious service: Not on file    Active member of club or organization: Not on file    Attends meetings of clubs or organizations: Not on file    Relationship status: Not on file  . Intimate partner violence    Fear of current or ex partner: Not on file    Emotionally abused: Not on file    Physically abused: Not on file    Forced sexual activity: Not on file  Other Topics Concern  . Not on file  Social History Narrative   Pateint is right-handed. She lives alone in a single level home. She rarely drinks caffeine. She is limited to exercise due to knee injuries.    Observations/Objective:   Height 5\' 4"  (1.626 m), weight 170 lb (77.1 kg). No acute distress.  Alert and oriented.  Speech fluent and not dysarthric.  Language intact.  Eyes orthophoric on primary gaze.  Face symmetric.  Assessment and Plan:   1.  Migraine without aura, without status migrainosus, not intractable 2.  Tension-type headache, not intractable  1.  For preventative management, will increase venlafaxine XR to 75mg  daily.  Will let Dr. Donell Beers be aware. 2.  For abortive therapy, Fioricet. 3.  Limit use of pain relievers to no more than 2 days out of week to prevent risk of rebound or medication-overuse headache. 4.  Keep headache diary 5.  Exercise, hydration, caffeine cessation, sleep hygiene, monitor for and avoid triggers 6.  Consider:  magnesium citrate 400mg  daily, riboflavin 400mg  daily, and coenzyme Q10 100mg  three times daily 7. Always keep in mind that currently taking a hormone or birth control may be a possible trigger or aggravating factor for migraine. 8. Follow up 4 months  Follow Up Instructions:    -I discussed the assessment and treatment plan with the patient. The patient was  provided an opportunity to ask questions and all were answered. The patient agreed with the plan and demonstrated an understanding of the instructions.   The patient was advised to call back or seek an in-person evaluation if the symptoms worsen or if the condition fails to improve as anticipated.  Cira Servant, DO

## 2019-05-27 ENCOUNTER — Encounter: Payer: Self-pay | Admitting: Neurology

## 2019-05-27 ENCOUNTER — Telehealth (INDEPENDENT_AMBULATORY_CARE_PROVIDER_SITE_OTHER): Payer: Self-pay | Admitting: Neurology

## 2019-05-27 ENCOUNTER — Other Ambulatory Visit: Payer: Self-pay

## 2019-05-27 VITALS — Ht 64.0 in | Wt 170.0 lb

## 2019-05-27 DIAGNOSIS — G44229 Chronic tension-type headache, not intractable: Secondary | ICD-10-CM

## 2019-05-27 DIAGNOSIS — G43009 Migraine without aura, not intractable, without status migrainosus: Secondary | ICD-10-CM

## 2019-05-31 ENCOUNTER — Ambulatory Visit (HOSPITAL_COMMUNITY): Payer: Self-pay | Admitting: Psychiatry

## 2019-06-03 MED FILL — GABAPENTIN 300 MG CAPSULE: 300 | 6 days supply | Qty: 40 | Fill #3

## 2019-06-06 ENCOUNTER — Telehealth: Payer: Self-pay | Admitting: Internal Medicine

## 2019-06-06 DIAGNOSIS — G629 Polyneuropathy, unspecified: Secondary | ICD-10-CM

## 2019-06-06 NOTE — Telephone Encounter (Signed)
Pt calling to check on this.  States that she is in [place] with her father who is under [place].  Pt wants to know if she can get a refill sent to her there in [place] since she doesn't have a family member who can get her refill from [facility] to her quickly. Pt states that she took her last pill this morning. Pt wants to know if this can be called in to [facility] at [place], [place] at [employer] [phone] (Phone) [phone] (Fax)   Pt would like to know what can be done - she can be reached at [phone]

## 2019-06-06 NOTE — Telephone Encounter (Addendum)
Pt calling to check on status of a small amount  (4 days) of the    gabapentin (NEURONTIN) 300 MG capsule  Her med was mailed regular mail on Tues to her in [place], and she may not get until several days. Pt states she ran out today and she has already starting shaking.    Please call if there is a problem.

## 2019-06-10 ENCOUNTER — Encounter: Payer: Self-pay | Admitting: Internal Medicine

## 2019-06-10 ENCOUNTER — Other Ambulatory Visit: Payer: Self-pay | Admitting: Internal Medicine

## 2019-06-10 DIAGNOSIS — G629 Polyneuropathy, unspecified: Secondary | ICD-10-CM

## 2019-06-11 NOTE — Telephone Encounter (Signed)
I cannot Rx meds in [place]. She should request a Rx transfer from her pharmacy. They should authorize a "vacation release".

## 2019-06-12 ENCOUNTER — Other Ambulatory Visit: Payer: Self-pay | Admitting: Internal Medicine

## 2019-06-12 DIAGNOSIS — G629 Polyneuropathy, unspecified: Secondary | ICD-10-CM

## 2019-06-12 MED FILL — GABAPENTIN 300 MG CAPSULE: 300 | 30 days supply | Qty: 120 | Fill #0

## 2019-06-12 NOTE — Telephone Encounter (Signed)
Rx has been denied.  Too early.

## 2019-06-14 ENCOUNTER — Ambulatory Visit (INDEPENDENT_AMBULATORY_CARE_PROVIDER_SITE_OTHER): Payer: Self-pay | Admitting: Psychiatry

## 2019-06-14 ENCOUNTER — Other Ambulatory Visit: Payer: Self-pay

## 2019-06-14 DIAGNOSIS — F4001 Agoraphobia with panic disorder: Secondary | ICD-10-CM

## 2019-06-14 MED ORDER — VENLAFAXINE HCL ER 75 MG PO CP24: ORAL_CAPSULE | ORAL | 1 refills | Status: DC

## 2019-06-14 MED ORDER — DIAZEPAM 10 MG PO TABS: ORAL_TABLET | ORAL | 4 refills | Status: DC

## 2019-06-14 MED FILL — VENLAFAXINE HCL ER 75 MG CA: 75 | 90 days supply | Qty: 90 | Fill #0

## 2019-06-14 MED FILL — DIAZEPAM 10 MG TABS: 10 | 32 days supply | Qty: 90 | Fill #0

## 2019-06-14 NOTE — Progress Notes (Signed)
BH MD/PA/NP OP Progress Note  06/14/2019 9:56 AM Sabrina [name]  MRN:  [id number]  Chief Complaint: Panic disorder without agoraphobia  This patient is being treated for panic disorder with agoraphobia.  At this time the patient is doing very well.  Her significant stressor is that her father is dying and she is in [place] taking care of him living with him and her sister and their 2 children.  This is her sister who she never got along well with in the last 5 years.  Now they are getting along better.  Her father needs constant medical attention in this patient seems to be the primary caregiver for her father.  He needs lots of medical care.  Her father is receiving morphine and is in the last few weeks of his life.  Again the patient seems to be financially fairly stable.  She is getting food stamps.  She likes living with her sister and her 2 children ages 29 and 88.  Physically the patient is doing much better.  She is not have any more skin problems and her knee is better.  She still interested in possibly getting a knee replacement as she still has some chronic mild pain.  The patient is financially assisted with a cousin who is very helpful.  The patient denies persistent daily depression.  Her anxiety is fairly well controlled.  She is not having any reported panic episodes.  Patient has no psychosis.  Patient drinks no alcohol uses no drugs.  She herself denies any chest pain shortness of breath or fever.  Overall she is doing actually well.  She is getting prepared for the eventual death of her father.  He then will return back to [place].  The patient's work-up is on hold at this time.  HPI:   Past Psychiatric History: See intake H&P for full details. Reviewed, with no updates at this time.   Past Medical History:  Past Medical History:  Diagnosis Date  . Arthritis    "knees" (09/06/2017)  . Bullous pemphigus   . Cat bite 06/2014   to left elbow  . History of blood transfusion 1988    "when I had my baby"  . Muscle weakness of lower extremity 2001; 09/05/2017   "resolved after a couple weeks; ?" (09/06/2017)  . 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 Poisoning, snake bite 04/08/2016   "[place]; RUE"  . PONV (postoperative nausea and vomiting)   . Situational anxiety   . Staph infection ~ 2015   "left elbow and finger"    Past Surgical History:  Procedure Laterality Date  . APPENDECTOMY  ~ 1987  . APPLICATION OF A-CELL OF EXTREMITY Left 08/05/2015   Procedure: APPLICATION OF A-CELL OF EXTREMITY;  Surgeon: Peggye Form, DO;  Location: [facility];  Service: Plastics;  Laterality: Left;  . BREAST SURGERY Right 1990   "milk duct taken out"  . CHONDROPLASTY Right 02/19/2019   Procedure: CHONDROPLASTY; EXCISION EXOSTOSIS;  Surgeon: Valeria Batman, MD;  Location: [facility];  Service: Orthopedics;  Laterality: Right;  . DEBRIDEMENT AND CLOSURE WOUND Left 07/01/2015   Procedure: LEFT ELBOW EXCISION OF WOUND WITH PRIMARY CLOSURE 2X5 CM ;  Surgeon: Peggye Form, DO;  Location: [facility];  Service: Plastics;  Laterality: Left;  . ELBOW SURGERY Left X 23 in [place] <06/2015   from a cat bite; all I&D  . I&D EXTREMITY Left 07/08/2015   Procedure: IRRIGATION AND DEBRIDEMENT  EXTREMITY, DRAINAGE OF LEFT ARM WOUND, A-CELL PLACEMENT, WOUND VAC PLACEMENT;  Surgeon: Alena Bills Dillingham, DO;  Location: [facility];  Service: Plastics;  Laterality: Left;  . INCISION AND DRAINAGE OF WOUND Left 08/05/2015   Procedure: IRRIGATION AND DEBRIDEMENT LEFT ELBOW WOUND, PLACEMENT OF ACELL;  Surgeon: Peggye Form, DO;  Location: [facility];  Service: Plastics;  Laterality: Left;  . KNEE ARTHROSCOPY WITH MEDIAL MENISECTOMY Right 02/19/2019   Procedure: RIGHT KNEE ARTHROSCOPY, DEBRIDEMENT, PARTIAL MEDIAL AND LATERAL MENISECTOMY;  Surgeon: Valeria Batman, MD;  Location: [facility];  Service: Orthopedics;   Laterality: Right;  . LAPAROSCOPIC CHOLECYSTECTOMY  1998  . SKIN GRAFT Left 2016   took from anterior thigh; placed at elbow  . TONSILLECTOMY  ~ 2000  . TOTAL ABDOMINAL HYSTERECTOMY  2003  . WRIST SURGERY Right 01/2016    Family Psychiatric History: See intake H&P for full details. Reviewed, with no updates at this time.   Family History:  Family History  Problem Relation Age of Onset  . Liver disease Mother   . Dementia Mother   . Cirrhosis Mother   . Prostate cancer Father   . Colon cancer Maternal Grandmother   . Ovarian cancer Maternal Aunt     Social History:  Social History   Socioeconomic History  . Marital status: Divorced    Spouse name: Not on file  . Number of children: 1  . Years of education: Not on file  . Highest education level: Associate degree: academic program  Occupational History  . Occupation: unemployed  Social Needs  . Financial resource strain: Not on file  . Food insecurity    Worry: Not on file    Inability: Not on file  . Transportation needs    Medical: Not on file    Non-medical: Not on file  Tobacco Use  . Smoking status: Never Smoker  . Smokeless tobacco: Never Used  Substance and Sexual Activity  . Alcohol use: Yes    Alcohol/week: 0.0 standard drinks    Comment: social  . Drug use: No  . Sexual activity: Not Currently  Lifestyle  . Physical activity    Days per week: Not on file    Minutes per session: Not on file  . Stress: Not on file  Relationships  . Social Musician on phone: Not on file    Gets together: Not on file    Attends religious service: Not on file    Active member of club or organization: Not on file    Attends meetings of clubs or organizations: Not on file    Relationship status: Not on file  Other Topics Concern  . Not on file  Social History Narrative   Pateint is right-handed. She lives alone in a single level home. She rarely drinks caffeine. She is limited to exercise due to knee  injuries.    Allergies:  Allergies  Allergen Reactions  . Zofran [Ondansetron Hcl] Hives and Other (See Comments)    Spots around iv site  . Droperidol Palpitations  . Flexeril [Cyclobenzaprine] Anxiety  . Morphine And Related Itching  . Tessalon [Benzonatate] Other (See Comments)    Watery eyes  . Voltaren [Diclofenac Sodium] Rash    Metabolic Disorder Labs: Lab Results  Component Value Date   HGBA1C 5.2 06/18/2018   MPG  07/05/2015    QUESTIONABLE IDENTIFICATION / INCORRECTLY LABELED SPECIMEN   No results found for: PROLACTIN Lab Results  Component Value  Date   CHOL 209 (H) 06/18/2018   TRIG 71 06/18/2018   HDL 73 06/18/2018   CHOLHDL 2.9 06/18/2018   VLDL 29 04/27/2015   LDLCALC 122 (H) 06/18/2018   LDLCALC 138 (H) 04/27/2015   Lab Results  Component Value Date   TSH 2.890 06/18/2018   TSH  07/05/2015    QUESTIONABLE IDENTIFICATION / INCORRECTLY LABELED SPECIMEN    Therapeutic Level Labs: No results found for: LITHIUM No results found for: VALPROATE No components found for:  CBMZ  Current Medications: Current Outpatient Medications  Medication Sig Dispense Refill  . butalbital-acetaminophen-caffeine (FIORICET) 50-325-40 MG tablet TAKE 1 TABLET BY MOUTH EVERY 6 HOURS AS NEEDED FOR HEADACHE 10 tablet 3  . diazepam (VALIUM) 10 MG tablet Take 1 tablet (10 mg) twice a day for one week then, IF NEEDED, increase to 1 tablet (10 mg) in the morning and 2 tablets (20 mg) at bedtime 90 tablet 4  . gabapentin (NEURONTIN) 300 MG capsule Take 2 capsules (600 mg total) by mouth 3 (three) times daily. 180 capsule 1  . solifenacin (VESICARE) 5 MG tablet Take 1 tablet (5 mg total) by mouth daily. 30 tablet 5  . venlafaxine XR (EFFEXOR-XR) 75 MG 24 hr capsule 1  qam 90 capsule 1   No current facility-administered medications for this visit.      Musculoskeletal: Strength & Muscle Tone: within normal limits Gait & Station: normal Patient leans: N/A  Psychiatric  Specialty Exam: ROS  There were no vitals taken for this visit.There is no height or weight on file to calculate BMI.  General Appearance: Casual and Well Groomed  Eye Contact:  Good  Speech:  Clear and Coherent  Volume:  Normal  Mood:  Euthymic  Affect:  Congruent  Thought Process:  Goal Directed and Descriptions of Associations: Intact  Orientation:  Full (Time, Place, and Person)  Thought Content: Logical   Suicidal Thoughts:  No  Homicidal Thoughts:  No  Memory:  Immediate;   Fair  Judgement:  Fair  Insight:  Fair  Psychomotor Activity:  Normal  Concentration:  Concentration: Good  Recall:  Good  Fund of Knowledge: Good  Language: Good  Akathisia:  Negative  Handed:  Right  AIMS (if indicated): not done  Assets:  Communication Skills Desire for Improvement Housing Transportation  ADL's:  Intact  Cognition: WNL  Sleep:  Fair   Screenings: PHQ2-9     Office Visit from 09/21/2018 in [facility] Office Visit from 06/25/2018 in [facility] Office Visit from 06/18/2018 in [facility] Office Visit from 08/28/2017 in [facility] Office Visit from 05/22/2017 in [facility]  PHQ-2 Total Score  1  0  0  0  0       Assessment and Plan:   This patient's #1 problem is that of panic disorder with agoraphobia.  This seems to be fairly well controlled taking Valium 30 mg.  The patient is also taking Effexor.  Recently she had her dose increased to 75 mg XR.  She is seeing a neurologist who is treating her for migraines and he is contacted to be asked to clarify this higher dose.  I shared with him that it was perfectly appropriate and I will continue prescribing 75 XR of her Effexor as well as her Valium.  The patient she will return to see me in 3 months hopefully in person.  The patient is  receiving some form of therapy and that she is getting therapy from the hospice nurse/social worker  who is helping her with her father in [place].  She sees this nursing/social worker every other week.  The patient is certainly not suicidal.  She is actually functioning fairly well.   Status of current problems: gradually improving  Labs Ordered: No orders of the defined types were placed in this encounter.   Labs Reviewed: n/a  Collateral Obtained/Records Reviewed: n/a  Plan:  Continue Lexapro 20 mg daily Increase Seroquel to 200 mg nightly Return to clinic in 3-4 months, transfer care to Dr. Liz BeachPlovsky   Schon Zeiders I Amato Sevillano, MD 06/14/2019, 9:56 AM

## 2019-06-21 ENCOUNTER — Ambulatory Visit: Payer: Self-pay | Admitting: Internal Medicine

## 2019-06-21 DIAGNOSIS — Z0289 Encounter for other administrative examinations: Secondary | ICD-10-CM

## 2019-07-08 MED FILL — GABAPENTIN 300 MG CAPSULE: 300 | 30 days supply | Qty: 120 | Fill #1

## 2019-07-08 MED FILL — BUTALB-ACETAMIN-CAFF 50-325: 50-325-40 | 2 days supply | Qty: 10 | Fill #1

## 2019-07-15 ENCOUNTER — Other Ambulatory Visit: Payer: Self-pay | Admitting: Internal Medicine

## 2019-07-15 DIAGNOSIS — G629 Polyneuropathy, unspecified: Secondary | ICD-10-CM

## 2019-07-16 ENCOUNTER — Telehealth: Payer: Self-pay | Admitting: Internal Medicine

## 2019-07-16 DIAGNOSIS — G629 Polyneuropathy, unspecified: Secondary | ICD-10-CM

## 2019-07-18 ENCOUNTER — Other Ambulatory Visit: Payer: Self-pay | Admitting: Internal Medicine

## 2019-07-18 DIAGNOSIS — G629 Polyneuropathy, unspecified: Secondary | ICD-10-CM

## 2019-07-18 MED ORDER — GABAPENTIN 300 MG PO CAPS: 600.0000 mg | ORAL_CAPSULE | Freq: Three times a day (TID) | ORAL | 2 refills | Status: DC

## 2019-07-18 NOTE — Telephone Encounter (Signed)
Rx for #180 with 2 refills sent.

## 2019-07-18 NOTE — Telephone Encounter (Signed)
Pt stated she was only orescribed 40 pills of the gabapentin (NEURONTIN) 300 MG capsule. Pt stated that is only 6 days worth and she would like Dr. Jerilee Hoh to call her back asap because she is out of town and will be out of the medication by Monday.

## 2019-07-18 NOTE — Telephone Encounter (Signed)
Regan with [facility] called in stating patient had contacted their office stating she is having a hard time getting her prescription and that she is panicking about having enough. Regan stated patient is in panic mode and is already having a hard time since family member has passed. Regan stated supply that was given to patient is not going to be enough for her. Advised her message will be sent and someone will reach out to patient and see what can be done. Regan stated she will contact patient and let her know. Please advise.

## 2019-07-29 IMAGING — CR RIGHT KNEE - COMPLETE 4+ VIEW
4 series · 4 of 4 positions shown · non-contrast
Comparison: 09/22/2018

CLINICAL DATA: Recent knee surgery (02/19/2019).  Fell today.

EXAM:
RIGHT KNEE - COMPLETE 4+ VIEW

[t knee ap right]
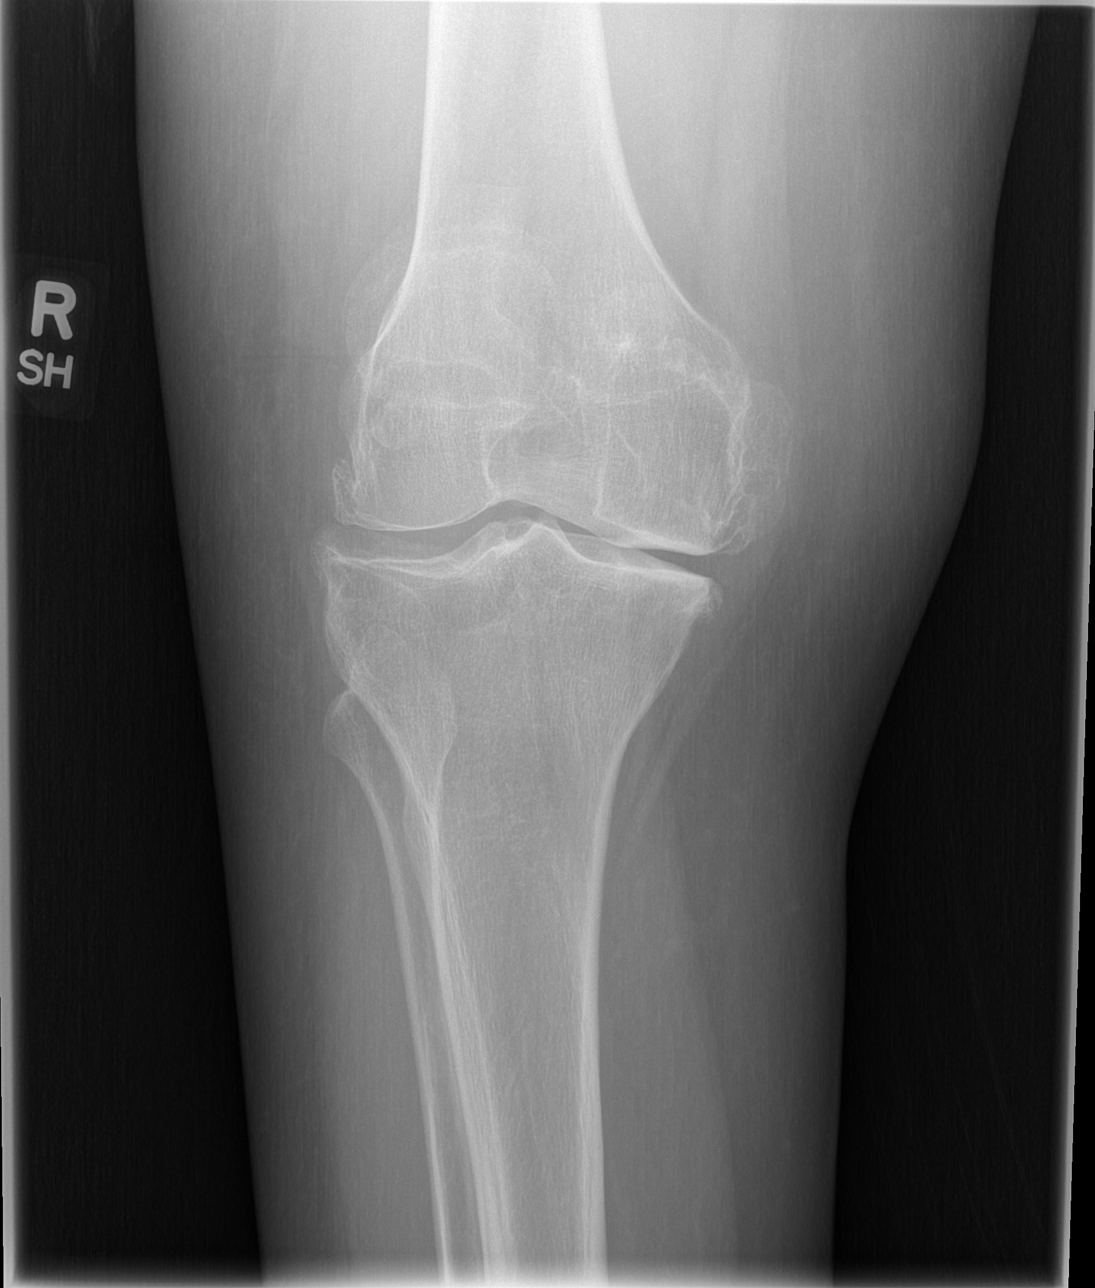

[t knee oblique right (1 of 2)]
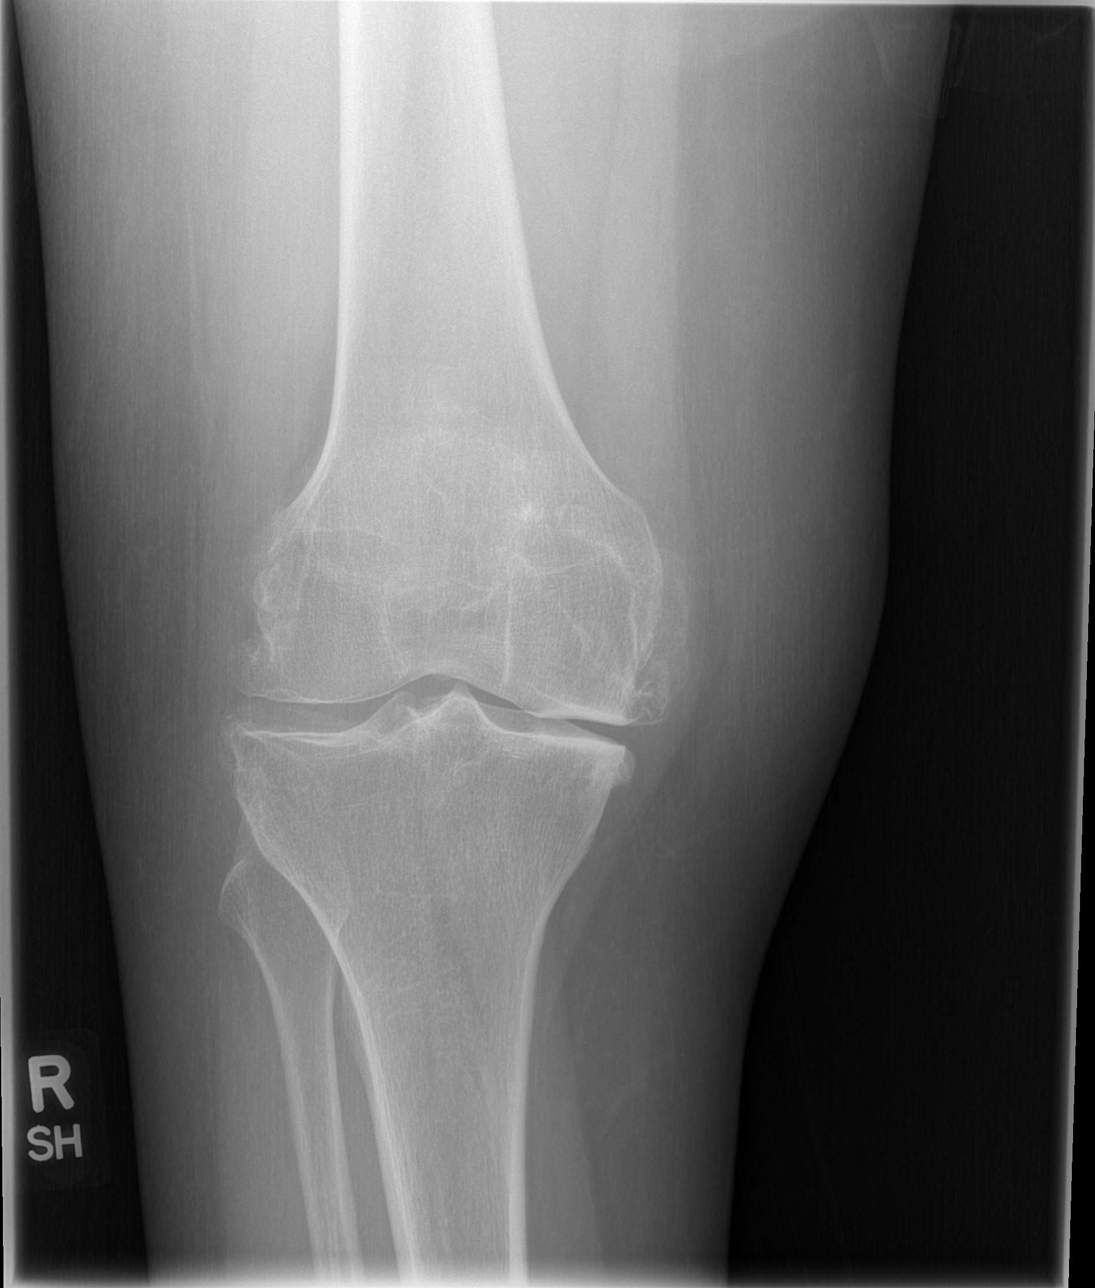

[t knee oblique right (2 of 2)]
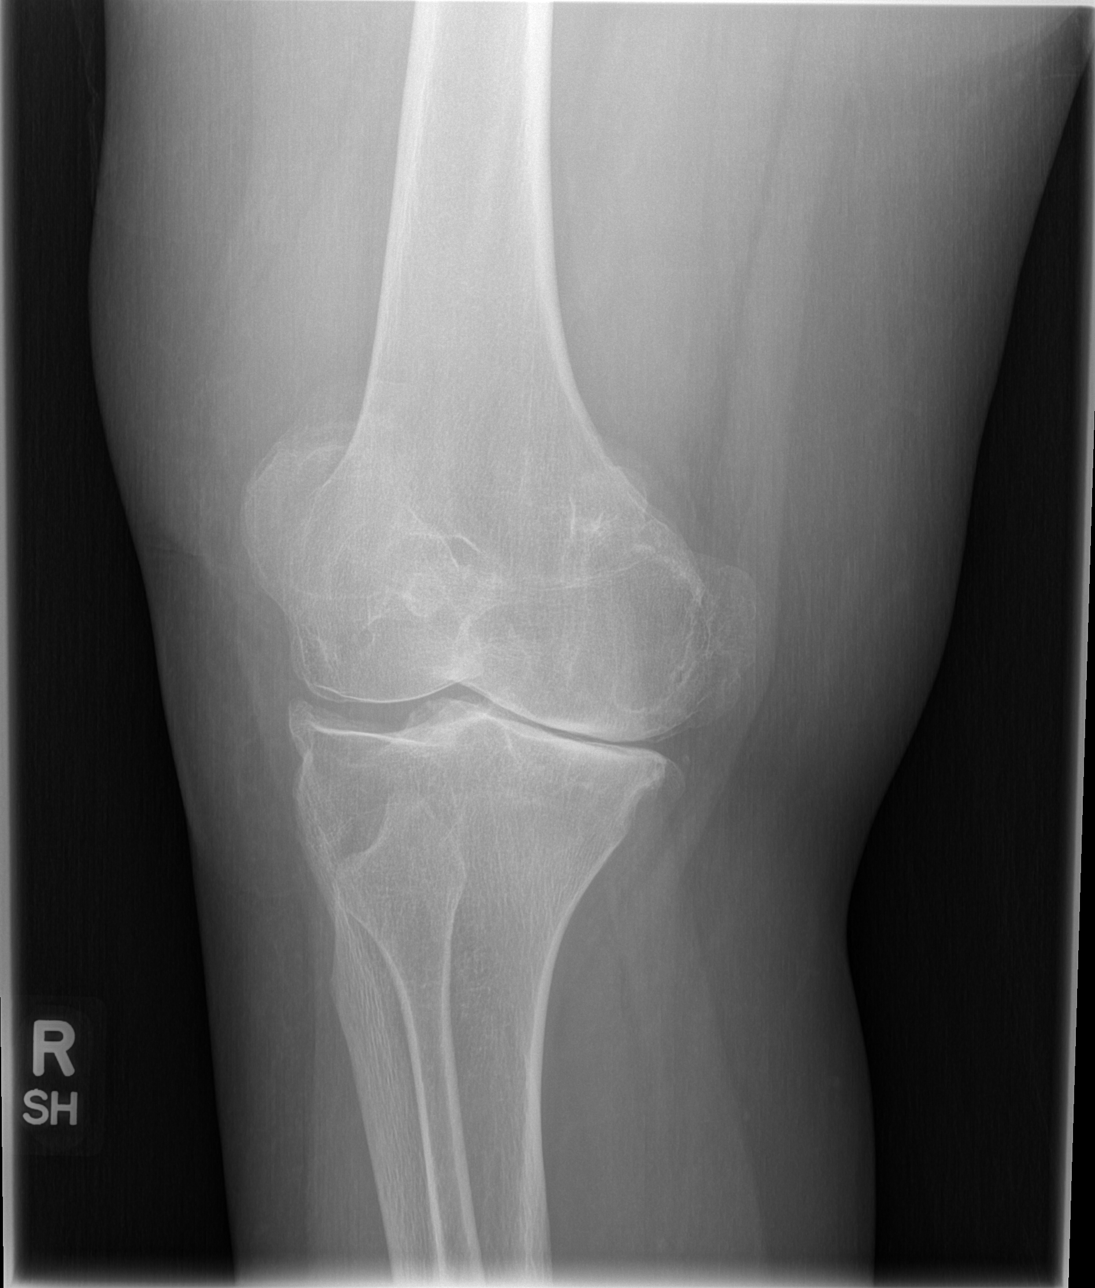

[t knee lat right]
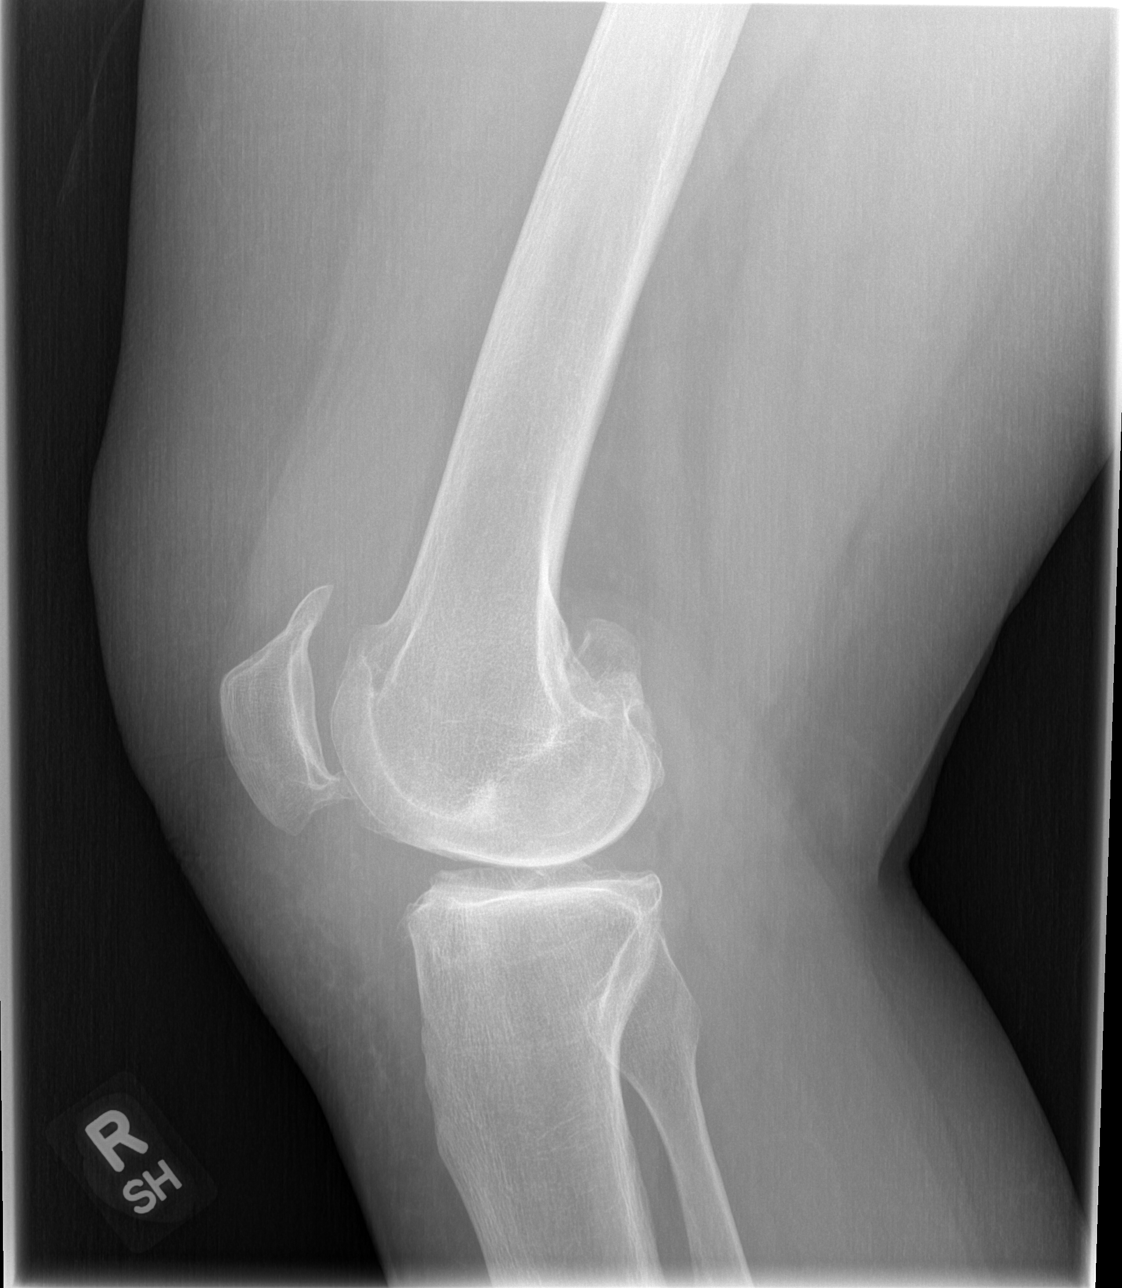

[4 of 4 positions shown; findings below may reference images not displayed]

FINDINGS: Stable age advanced tricompartmental degenerative changes with joint
space narrowing and osteophytic spurring. No acute fracture or
osteochondral lesion. There is a moderate-sized knee joint effusion.
IMPRESSION: Age advanced tricompartmental degenerative changes appears stable.

No acute fracture is identified.

Moderate to large joint effusion.

## 2019-08-01 MED FILL — GABAPENTIN 300 MG CAPSULE: 300 | 30 days supply | Qty: 180 | Fill #0

## 2019-08-02 MED FILL — DIAZEPAM 10 MG TABS: 10 | 32 days supply | Qty: 90 | Fill #1

## 2019-08-02 MED FILL — BUTALB-ACETAMIN-CAFF 50-325: 50-325-40 | 2 days supply | Qty: 10 | Fill #2

## 2019-08-04 ENCOUNTER — Other Ambulatory Visit: Payer: Self-pay

## 2019-08-04 ENCOUNTER — Emergency Department (HOSPITAL_BASED_OUTPATIENT_CLINIC_OR_DEPARTMENT_OTHER)
Admission: EM | Admit: 2019-08-04 | Discharge: 2019-08-04 | Disposition: A | Discharge status: Home / Self Care | Payer: Self-pay | Attending: Emergency Medicine | Admitting: Emergency Medicine

## 2019-08-04 ENCOUNTER — Encounter (HOSPITAL_BASED_OUTPATIENT_CLINIC_OR_DEPARTMENT_OTHER): Payer: Self-pay | Admitting: Emergency Medicine

## 2019-08-04 DIAGNOSIS — R0602 Shortness of breath: Secondary | ICD-10-CM | POA: Insufficient documentation

## 2019-08-04 DIAGNOSIS — Z5321 Procedure and treatment not carried out due to patient leaving prior to being seen by health care provider: Secondary | ICD-10-CM | POA: Insufficient documentation

## 2019-08-04 NOTE — ED Triage Notes (Signed)
Pt reports productive cough x 4 days. Denies fever, reports sob when lying down. Pt reports hx of Covid on October.

## 2019-08-05 ENCOUNTER — Telehealth: Payer: Self-pay

## 2019-08-05 ENCOUNTER — Emergency Department (HOSPITAL_BASED_OUTPATIENT_CLINIC_OR_DEPARTMENT_OTHER): Payer: Self-pay

## 2019-08-05 ENCOUNTER — Telehealth: Payer: Self-pay | Admitting: Family Medicine

## 2019-08-05 ENCOUNTER — Telehealth: Payer: Self-pay | Admitting: Orthopaedic Surgery

## 2019-08-05 ENCOUNTER — Telehealth (INDEPENDENT_AMBULATORY_CARE_PROVIDER_SITE_OTHER): Payer: Self-pay | Admitting: Family Medicine

## 2019-08-05 ENCOUNTER — Encounter (HOSPITAL_BASED_OUTPATIENT_CLINIC_OR_DEPARTMENT_OTHER): Payer: Self-pay

## 2019-08-05 ENCOUNTER — Encounter: Payer: Self-pay | Admitting: Family Medicine

## 2019-08-05 ENCOUNTER — Emergency Department (HOSPITAL_BASED_OUTPATIENT_CLINIC_OR_DEPARTMENT_OTHER)
Admission: EM | Admit: 2019-08-05 | Discharge: 2019-08-05 | Disposition: A | Discharge status: Home / Self Care | Payer: Self-pay | Attending: Emergency Medicine | Admitting: Emergency Medicine

## 2019-08-05 ENCOUNTER — Other Ambulatory Visit: Payer: Self-pay

## 2019-08-05 DIAGNOSIS — Z79899 Other long term (current) drug therapy: Secondary | ICD-10-CM | POA: Insufficient documentation

## 2019-08-05 DIAGNOSIS — J069 Acute upper respiratory infection, unspecified: Secondary | ICD-10-CM

## 2019-08-05 HISTORY — DX: Other specified postprocedural states: Z98.890

## 2019-08-05 MED ORDER — SALINE SPRAY 0.65 % NA SOLN: 1.0000 | NASAL | 0 refills | Status: DC | PRN

## 2019-08-05 MED ORDER — ALBUTEROL SULFATE HFA 108 (90 BASE) MCG/ACT IN AERS
1.0000 | INHALATION_SPRAY | Freq: Once | RESPIRATORY_TRACT | Status: AC
  Administered 2019-08-05: 10:00:00 1 via RESPIRATORY_TRACT
  Filled 2019-08-05: qty 6.7

## 2019-08-05 MED ORDER — HYDROCODONE-HOMATROPINE 5-1.5 MG/5ML PO SYRP: 5.0000 mL | ORAL_SOLUTION | ORAL | 0 refills | Status: DC | PRN

## 2019-08-05 MED ORDER — DEXTROMETHORPHAN-GUAIFENESIN 10-100 MG/5ML PO LIQD: 5.0000 mL | ORAL | 0 refills | Status: DC | PRN

## 2019-08-05 MED FILL — DEEP SEA 0.65% NOSE SPRAY: 0.65 | 15 days supply | Qty: 44 | Fill #0

## 2019-08-05 MED FILL — SM TUSSIN DM SYRUP: 100-10 | 4 days supply | Qty: 118 | Fill #0

## 2019-08-05 NOTE — ED Triage Notes (Signed)
Pt reports + Covid in October (around the 18th or 19th), c/o continued cough X4 days, worse at night.

## 2019-08-05 NOTE — Telephone Encounter (Signed)
Patient would like to be worked in for an appointment for her right knee.  Advised her that Dr. Durward Fortes would be out of the office and the next available would be on 08/20/2019.  Cb# [phone].  Please Advise.  Thank you.

## 2019-08-05 NOTE — Discharge Instructions (Addendum)
Your chest x-ray today was clear.  Continue using Flonase for nasal congestion.  You can also use nasal saline spray to help thin secretions.  Use the albuterol inhaler 1 to 2 puffs every 4-6 hours as needed for shortness of breath.  Take the cough syrup as prescribed.  Can take 1 to 2 tablets of Tylenol every 6 hours as needed for fever pain.  Drink plenty of fluids and get plenty of rest.  Continue to quarantine at home per current CDC guidelines.  Return to the emergency department if any concerning signs or symptoms develop such as shortness of breath, persistent vomiting, high fevers, loss of consciousness, or severe chest pains.

## 2019-08-05 NOTE — Progress Notes (Signed)
Virtual Visit via Video Note  I connected with the patient on 08/05/19 at  3:45 PM EST by a video enabled telemedicine application and verified that I am speaking with the correct person using two identifiers.  Location patient: home Location provider:work or home office Persons participating in the virtual visit: patient, provider  I discussed the limitations of evaluation and management by telemedicine and the availability of in person appointments. The patient expressed understanding and agreed to proceed.   HPI: Here to follow up a visit to the ER this morning. For the past 4 days she has had a deep cough productive of yellow sputum, mild SOB, and hoarseness. She has not been able to sleep for 4 nights because she is up coughing all night. She has sat up in a recliner with no improvement. At the ER her exam was unremarkable and her CXR was clear. Her oxygen sats were at 98%. She was given an albuterol inhaler and some Tussin DM, but she says she cannot stop coughing. In reviewing her chart I see the last time she was prescribed a narcotic cough syrup was in July. She did test positive for the Covid-19 virus in October.    ROS: See pertinent positives and negatives per HPI.  Past Medical History:  Diagnosis Date  . Arthritis    "knees" (09/06/2017)  . Bullous pemphigus   . Cat bite 06/2014   to left elbow  . History of blood transfusion 1988   "when I had my baby"  . Muscle weakness of lower extremity 2001; 09/05/2017   "resolved after a couple weeks; ?" (09/06/2017)  . 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 Poisoning, snake bite 04/08/2016   "[place]; RUE"  . PONV (postoperative nausea and vomiting)   . S/P right knee surgery   . Situational anxiety   . Staph infection ~ 2015   "left elbow and finger"    Past Surgical History:  Procedure Laterality Date  . APPENDECTOMY  ~ 1987  . APPLICATION OF A-CELL OF EXTREMITY Left 08/05/2015   Procedure: APPLICATION OF A-CELL OF  EXTREMITY;  Surgeon: Wallace Going, DO;  Location: [facility];  Service: Plastics;  Laterality: Left;  . BREAST SURGERY Right 1990   "milk duct taken out"  . CHONDROPLASTY Right 02/19/2019   Procedure: CHONDROPLASTY; EXCISION EXOSTOSIS;  Surgeon: Garald Balding, MD;  Location: [facility];  Service: Orthopedics;  Laterality: Right;  . DEBRIDEMENT AND CLOSURE WOUND Left 07/01/2015   Procedure: LEFT ELBOW EXCISION OF WOUND WITH PRIMARY CLOSURE 2X5 CM ;  Surgeon: Wallace Going, DO;  Location: [facility];  Service: Plastics;  Laterality: Left;  . ELBOW SURGERY Left X 23 in [place] <06/2015   from a cat bite; all I&D  . I&D EXTREMITY Left 07/08/2015   Procedure: IRRIGATION AND DEBRIDEMENT EXTREMITY, DRAINAGE OF LEFT ARM WOUND, A-CELL PLACEMENT, WOUND VAC PLACEMENT;  Surgeon: Loel Lofty Dillingham, DO;  Location: [facility];  Service: Plastics;  Laterality: Left;  . INCISION AND DRAINAGE OF WOUND Left 08/05/2015   Procedure: IRRIGATION AND DEBRIDEMENT LEFT ELBOW WOUND, PLACEMENT OF ACELL;  Surgeon: Wallace Going, DO;  Location: [facility];  Service: Plastics;  Laterality: Left;  . KNEE ARTHROSCOPY WITH MEDIAL MENISECTOMY Right 02/19/2019   Procedure: RIGHT KNEE ARTHROSCOPY, DEBRIDEMENT, PARTIAL MEDIAL AND LATERAL MENISECTOMY;  Surgeon: Garald Balding, MD;  Location: [facility];  Service: Orthopedics;  Laterality: Right;  . LAPAROSCOPIC CHOLECYSTECTOMY  1998  .  SKIN GRAFT Left 2016   took from anterior thigh; placed at elbow  . TONSILLECTOMY  ~ 2000  . TOTAL ABDOMINAL HYSTERECTOMY  2003  . WRIST SURGERY Right 01/2016    Family History  Problem Relation Age of Onset  . Liver disease Mother   . Dementia Mother   . Cirrhosis Mother   . Prostate cancer Father   . Colon cancer Maternal Grandmother   . Ovarian cancer Maternal Aunt      Current Outpatient Medications:  .  butalbital-acetaminophen-caffeine  (FIORICET) 50-325-40 MG tablet, TAKE 1 TABLET BY MOUTH EVERY 6 HOURS AS NEEDED FOR HEADACHE, Disp: 10 tablet, Rfl: 3 .  diazepam (VALIUM) 10 MG tablet, Take 1 tablet (10 mg) twice a day for one week then, IF NEEDED, increase to 1 tablet (10 mg) in the morning and 2 tablets (20 mg) at bedtime, Disp: 90 tablet, Rfl: 4 .  gabapentin (NEURONTIN) 300 MG capsule, Take 2 capsules (600 mg total) by mouth 3 (three) times daily., Disp: 180 capsule, Rfl: 2 .  sodium chloride (OCEAN) 0.65 % SOLN nasal spray, Place 1 spray into both nostrils as needed for congestion., Disp: 60 mL, Rfl: 0 .  venlafaxine XR (EFFEXOR-XR) 75 MG 24 hr capsule, 1  qam, Disp: 90 capsule, Rfl: 1 .  HYDROcodone-homatropine (HYDROMET) 5-1.5 MG/5ML syrup, Take 5 mLs by mouth every 4 (four) hours as needed., Disp: 240 mL, Rfl: 0 .  solifenacin (VESICARE) 5 MG tablet, Take 1 tablet (5 mg total) by mouth daily. (Patient not taking: Reported on 08/05/2019), Disp: 30 tablet, Rfl: 5  EXAM:  VITALS per patient if applicable:  GENERAL: alert, oriented, appears well and in no acute distress  HEENT: atraumatic, conjunttiva clear, no obvious abnormalities on inspection of external nose and ears  NECK: normal movements of the head and neck  LUNGS: on inspection no signs of respiratory distress, breathing rate appears normal, no obvious gross SOB, gasping or wheezing  CV: no obvious cyanosis  MS: moves all visible extremities without noticeable abnormality  PSYCH/NEURO: pleasant and cooperative, no obvious depression or anxiety, speech and thought processing grossly intact  ASSESSMENT AND PLAN: Viral URI. She can use Hydromet syrup as needed for the cough.  Gershon Crane, MD  Discussed the following assessment and plan:  No diagnosis found.     I discussed the assessment and treatment plan with the patient. The patient was provided an opportunity to ask questions and all were answered. The patient agreed with the plan and demonstrated  an understanding of the instructions.   The patient was advised to call back or seek an in-person evaluation if the symptoms worsen or if the condition fails to improve as anticipated.

## 2019-08-05 NOTE — ED Provider Notes (Signed)
[facility] EMERGENCY DEPARTMENT Provider Note   CSN: [id number] Arrival date & time: 08/05/19  5427     History   Chief Complaint Chief Complaint  Patient presents with  . Cough    HPI Sabrina [name] is a 53 y.o. female with history of arthritis, chronic headaches, anxiety presents for evaluation of acute onset, persistent nasal congestion, scratchy throat and productive cough for 4 days.  Reports cough is worse at night.  Productive of yellow-green sputum.  Notes some achiness to the anterior upper chest due to persistent cough.  Denies significant shortness of breath.  No fevers, abdominal pain, nausea or vomiting.  Reports that she had been diagnosed with Covid in October but had recovered well other than persistent fatigue.  She is a non-smoker, denies recreational drug use.  She has tried Flonase, Mucinex, Robitussin, and Delsym without relief of symptoms.  No known sick contacts.     The history is provided by the patient.    Past Medical History:  Diagnosis Date  . Arthritis    "knees" (09/06/2017)  . Bullous pemphigus   . Cat bite 06/2014   to left elbow  . History of blood transfusion 1988   "when I had my baby"  . Muscle weakness of lower extremity 2001; 09/05/2017   "resolved after a couple weeks; ?" (09/06/2017)  . 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 Poisoning, snake bite 04/08/2016   "[place]; RUE"  . PONV (postoperative nausea and vomiting)   . S/P right knee surgery   . Situational anxiety   . Staph infection ~ 2015   "left elbow and finger"    Patient Active Problem List   Diagnosis Date Noted  . 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 Other meniscus derangements, posterior horn of medial meniscus, left knee 02/19/2019  . Bucket handle tear of lateral meniscus 02/19/2019  . Unilateral primary osteoarthritis, right knee 12/13/2018  . Panic disorder 12/13/2018  . Chronic headaches 12/13/2018  . Weakness 09/06/2017  . Depression 09/06/2017  . Bullous  pemphigoid 09/06/2017  . Generalized anxiety disorder with panic attacks 09/06/2017  . Right hand pain   . Wrist swelling   . Cellulitis of right upper extremity 04/11/2016  . Elbow pain 07/20/2015  . Anxiety 07/20/2015  . Tachycardia 07/04/2015  . 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[place])   . Skin ulcer of upper arm, limited to breakdown of skin ([place]) 07/01/2015    Past Surgical History:  Procedure Laterality Date  . APPENDECTOMY  ~ 1987  . APPLICATION OF A-CELL OF EXTREMITY Left 08/05/2015   Procedure: APPLICATION OF A-CELL OF EXTREMITY;  Surgeon: Wallace Going, DO;  Location: [facility];  Service: Plastics;  Laterality: Left;  . BREAST SURGERY Right 1990   "milk duct taken out"  . CHONDROPLASTY Right 02/19/2019   Procedure: CHONDROPLASTY; EXCISION EXOSTOSIS;  Surgeon: Garald Balding, MD;  Location: [facility];  Service: Orthopedics;  Laterality: Right;  . DEBRIDEMENT AND CLOSURE WOUND Left 07/01/2015   Procedure: LEFT ELBOW EXCISION OF WOUND WITH PRIMARY CLOSURE 2X5 CM ;  Surgeon: Wallace Going, DO;  Location: [facility];  Service: Plastics;  Laterality: Left;  . ELBOW SURGERY Left X 23 in [place] <06/2015   from a cat bite; all I&D  . I&D EXTREMITY Left 07/08/2015   Procedure: IRRIGATION AND DEBRIDEMENT EXTREMITY, DRAINAGE OF LEFT ARM WOUND, A-CELL PLACEMENT, WOUND VAC PLACEMENT;  Surgeon: Loel Lofty Dillingham, DO;  Location[facility];  Service:  Plastics;  Laterality: Left;  . INCISION AND DRAINAGE OF WOUND Left 08/05/2015   Procedure: IRRIGATION AND DEBRIDEMENT LEFT ELBOW WOUND, PLACEMENT OF ACELL;  Surgeon: Peggye Form, DO;  Location: [facility];  Service: Plastics;  Laterality: Left;  . KNEE ARTHROSCOPY WITH MEDIAL MENISECTOMY Right 02/19/2019   Procedure: RIGHT KNEE ARTHROSCOPY, DEBRIDEMENT, PARTIAL MEDIAL AND LATERAL MENISECTOMY;  Surgeon: Valeria Batman, MD;  Location: [facility];  Service:  Orthopedics;  Laterality: Right;  . LAPAROSCOPIC CHOLECYSTECTOMY  1998  . SKIN GRAFT Left 2016   took from anterior thigh; placed at elbow  . TONSILLECTOMY  ~ 2000  . TOTAL ABDOMINAL HYSTERECTOMY  2003  . WRIST SURGERY Right 01/2016     OB History   No obstetric history on file.      Home Medications    Prior to Admission medications   Medication Sig Start Date End Date Taking? Authorizing Provider  butalbital-acetaminophen-caffeine (FIORICET) 50-325-40 MG tablet TAKE 1 TABLET BY MOUTH EVERY 6 HOURS AS NEEDED FOR HEADACHE 05/22/19   Everlena Cooper, Adam R, DO  dextromethorphan-guaiFENesin (TUSSIN DM) 10-100 MG/5ML liquid Take 5 mLs by mouth every 4 (four) hours as needed for cough. 08/05/19   Shir Bergman A, PA-C  diazepam (VALIUM) 10 MG tablet Take 1 tablet (10 mg) twice a day for one week then, IF NEEDED, increase to 1 tablet (10 mg) in the morning and 2 tablets (20 mg) at bedtime 06/14/19   Plovsky, Earvin Hansen, MD  gabapentin (NEURONTIN) 300 MG capsule Take 2 capsules (600 mg total) by mouth 3 (three) times daily. 07/18/19 08/17/19  Philip Aspen, Limmie Patricia, MD  sodium chloride (OCEAN) 0.65 % SOLN nasal spray Place 1 spray into both nostrils as needed for congestion. 08/05/19   Tahra Hitzeman A, PA-C  solifenacin (VESICARE) 5 MG tablet Take 1 tablet (5 mg total) by mouth daily. 03/27/19   Burchette, Elberta Fortis, MD  venlafaxine XR (EFFEXOR-XR) 75 MG 24 hr capsule 1  qam 06/14/19   Archer Asa, MD    Family History Family History  Problem Relation Age of Onset  . Liver disease Mother   . Dementia Mother   . Cirrhosis Mother   . Prostate cancer Father   . Colon cancer Maternal Grandmother   . Ovarian cancer Maternal Aunt     Social History Social History   Tobacco Use  . Smoking status: Never Smoker  . Smokeless tobacco: Never Used  Substance Use Topics  . Alcohol use: Never    Alcohol/week: 0.0 standard drinks    Frequency: Never    Comment: social  . Drug use: No     Allergies    Zofran [ondansetron hcl], Droperidol, Flexeril [cyclobenzaprine], Morphine and related, Tessalon [benzonatate], and Voltaren [diclofenac sodium]   Review of Systems Review of Systems  Constitutional: Negative for chills and fever.  HENT: Positive for congestion. Negative for sore throat.   Respiratory: Positive for cough and chest tightness.   Cardiovascular: Positive for chest pain (chest wall pain).  Gastrointestinal: Negative for abdominal pain, nausea and vomiting.  All other systems reviewed and are negative.    Physical Exam Updated Vital Signs BP 112/75 (BP Location: Right Arm)   Pulse 91   Temp 97.9 F (36.6 C) (Oral)   Resp 18   Ht  (1.626 m)   Wt 82.1 kg   SpO2 98%   BMI 31.07 kg/m   Physical Exam Vitals signs and nursing note reviewed.  Constitutional:  General: She is not in acute distress.    Appearance: She is well-developed.  HENT:     Head: Normocephalic and atraumatic.     Nose: Congestion present.  Eyes:     General:        Right eye: No discharge.        Left eye: No discharge.     Conjunctiva/sclera: Conjunctivae normal.  Neck:     Musculoskeletal: Normal range of motion and neck supple.     Vascular: No JVD.     Trachea: No tracheal deviation.  Cardiovascular:     Rate and Rhythm: Normal rate and regular rhythm.  Pulmonary:     Effort: Pulmonary effort is normal.     Breath sounds: Normal breath sounds.     Comments: Speaking in full sentences without difficulty, ambulated in the room with SPO2 saturations 98% on room air Abdominal:     General: Bowel sounds are normal. There is no distension.     Tenderness: There is no abdominal tenderness. There is no guarding or rebound.  Skin:    General: Skin is warm and dry.     Coloration: Skin is not jaundiced.     Findings: No erythema.  Neurological:     Mental Status: She is alert.  Psychiatric:        Behavior: Behavior normal.      ED Treatments / Results  Labs (all labs  ordered are listed, but only abnormal results are displayed) Labs Reviewed - No data to display  EKG None  Radiology Dg Chest Portable 1 View  Result Date: 08/05/2019 CLINICAL DATA:  Cough.  COVID-19 positive EXAM: PORTABLE CHEST 1 VIEW COMPARISON:  March 30, 2019 FINDINGS: Lungs are clear. Heart size and pulmonary vascularity are normal. No adenopathy. No bone lesions. IMPRESSION: No edema or consolidation. Electronically Signed   By: Bretta BangWilliam  Woodruff III M.D.   On: 08/05/2019 09:46    Procedures Procedures (including critical care time)  Medications Ordered in ED Medications  albuterol (VENTOLIN HFA) 108 (90 Base) MCG/ACT inhaler 1 puff (1 puff Inhalation Given 08/05/19 0948)     Initial Impression / Assessment and Plan / ED Course  I have reviewed the triage vital signs and the nursing notes.  Pertinent labs & imaging results that were available during my care of the patient were reviewed by me and considered in my medical decision making (see chart for details).        Sabrina [name] was evaluated in Emergency Department on 08/05/2019 for the symptoms described in the history of present illness. She was evaluated in the context of the global COVID-19 pandemic, which necessitated consideration that the patient might be at risk for infection with the SARS-CoV-2 virus that causes COVID-19. Institutional protocols and algorithms that pertain to the evaluation of patients at risk for COVID-19 are in a state of rapid change based on information released by regulatory bodies including the CDC and federal and state organizations. These policies and algorithms were followed during the patient's care in the ED.  Patient presenting for evaluation of nasal congestion, cough for 4 days.  She is afebrile, vital signs are stable.  She is ambulated in the room with stable SPO2 saturations, no signs of respiratory distress.  Chest x-ray shows no acute cardiopulmonary abnormalities.  No evidence of  pneumonia.  She was recently infected with Covid last month but reports that she had been recovering well and has had no known exposures since then.  Suspect  viral URI which she states that she is prone to a couple of times a year.  Discussed symptomatic management.  Will give albuterol inhaler to take at home as needed for shortness of breath or cough.  Recommend follow-up with PCP.  Discussed quarantining at home per current CDC guidelines.  Discussed strict ED return precautions. Patient verbalized understanding of and agreement with plan and is safe for discharge home at this time.   Final Clinical Impressions(s) / ED Diagnoses   Final diagnoses:  Viral URI with cough    ED Discharge Orders         Ordered    dextromethorphan-guaiFENesin (TUSSIN DM) 10-100 MG/5ML liquid  Every 4 hours PRN     08/05/19 1007    sodium chloride (OCEAN) 0.65 % SOLN nasal spray  As needed     08/05/19 [address] [place] A, PA-C 08/05/19 1009    Jacalyn Lefevre, MD 08/05/19 1013

## 2019-08-05 NOTE — Telephone Encounter (Signed)
Patient called and left vm w/no message just d/o/b. Called patient back and LMOM to call us back @ [phone].

## 2019-08-06 ENCOUNTER — Ambulatory Visit (INDEPENDENT_AMBULATORY_CARE_PROVIDER_SITE_OTHER): Payer: Self-pay | Admitting: Orthopedic Surgery

## 2019-08-06 ENCOUNTER — Ambulatory Visit (INDEPENDENT_AMBULATORY_CARE_PROVIDER_SITE_OTHER): Payer: Self-pay

## 2019-08-06 ENCOUNTER — Encounter: Payer: Self-pay | Admitting: Orthopedic Surgery

## 2019-08-06 VITALS — BP 100/73 | HR 90 | Ht 64.0 in | Wt 178.0 lb

## 2019-08-06 DIAGNOSIS — M25561 Pain in right knee: Secondary | ICD-10-CM

## 2019-08-06 DIAGNOSIS — M1711 Unilateral primary osteoarthritis, right knee: Secondary | ICD-10-CM

## 2019-08-06 DIAGNOSIS — G8929 Other chronic pain: Secondary | ICD-10-CM

## 2019-08-06 MED ORDER — TRAMADOL HCL 50 MG PO TABS: 50.0000 mg | ORAL_TABLET | Freq: Four times a day (QID) | ORAL | 0 refills | Status: DC | PRN

## 2019-08-06 MED FILL — traMADol HCL 50 MG TABS: 50 | 7 days supply | Qty: 30 | Fill #0

## 2019-08-06 NOTE — Telephone Encounter (Signed)
Spoke with patient. She is going to be seen today as a work in at 2:30pm.

## 2019-08-06 NOTE — Progress Notes (Signed)
Office Visit Note   Patient: Sabrina [name]           Date of Birth: 08/07/1966           MRN: [id number] Visit Date: 08/06/2019              Requested by: Isaac Bliss, Rayford Halsted, MD [place],  [place] [address] PCP: Isaac Bliss, Rayford Halsted, MD   Assessment & Plan: Visit Diagnoses:  1. Chronic pain of right knee   2. Primary osteoarthritis of right knee     Plan:  #1::  Plan at this time would be to consider a total knee replacement on the right in the near future.  She will follow back up with [place] in the next 1 to 2 weeks and possibly at that time make decisions to consider total knee replacement on the right.   Follow-Up Instructions: Return in about 2 weeks (around 08/20/2019).   Orders:  Orders Placed This Encounter  Procedures  . XR Knee 1-2 Views Right   Meds ordered this encounter  Medications  . traMADol (ULTRAM) 50 MG tablet    Sig: Take 1 tablet (50 mg total) by mouth every 6 (six) hours as needed.    Dispense:  30 tablet    Refill:  0    Order Specific Question:   Supervising Provider    Answer:   Garald Balding [[id number]]      Procedures: No procedures performed   Clinical Data: No additional findings.   Subjective: Chief Complaint  Patient presents with  . Right Knee - Pain   HPI Patient presents today for right knee pain. She had a right knee arthroscopy on 02/19/2019. She was not able to complete her therapy after surgery due to having to travel out of town for a sick family member.  This was her father and he apparently succumbed to his disease and has passed away.  She said that her knee hurts medially and anteriorly, and is constant. The pain is worse if she bends it or does stairs. She takes Tylenol as needed. She said that it swells as well.  The pain is worsening.   Review of Systems  Constitutional: Negative for fatigue.  HENT: Negative for ear pain.   Eyes: Negative for pain.  Respiratory: Negative for  shortness of breath.   Cardiovascular: Negative for leg swelling.  Gastrointestinal: Negative for constipation and diarrhea.  Endocrine: Negative for cold intolerance and heat intolerance.  Genitourinary: Negative for difficulty urinating.  Musculoskeletal: Positive for joint swelling.  Skin: Negative for rash.  Allergic/Immunologic: Negative for food allergies.  Neurological: Negative for weakness.  Hematological: Does not bruise/bleed easily.  Psychiatric/Behavioral: Negative for sleep disturbance.     Objective: Vital Signs: BP 100/73   Pulse 90   Ht 5\' 4"  (1.626 m)   Wt 178 lb (80.7 kg)   BMI 30.55 kg/m   Physical Exam Constitutional:      Appearance: Normal appearance. She is well-developed.  Eyes:     Pupils: Pupils are equal, round, and reactive to light.  Pulmonary:     Effort: Pulmonary effort is normal.  Skin:    General: Skin is warm and dry.  Neurological:     Mental Status: She is alert and oriented to person, place, and time.  Psychiatric:        Behavior: Behavior normal.     Ortho Exam  Exam today reveals Marked diffuse tenderness about the knee especially  more on the medial lateral joint line.  She has limited motion secondary to pain.  Crepitance with range of motion.  Calf is supple nontender.  Neurovascular intact distally.  Specialty Comments:  No specialty comments available.  Imaging: Xr Knee 1-2 Views Right  Result Date: 08/06/2019 2 view x-ray of the right knee reveals essentially bone-on-bone medial compartment which is much more pronounced now on this film than her previous film from July of this year..  She may have some translation of the femur medially on the tibia.  Periarticular osteophytes are noted.  She has a large spur off of the superior pole of the patella.  Appears to be possible loose body posteriorly.  Incidental note that the left knee is also have marked medial compartment narrowing.    PMFS History: Current Outpatient  Medications  Medication Sig Dispense Refill  . butalbital-acetaminophen-caffeine (FIORICET) 50-325-40 MG tablet TAKE 1 TABLET BY MOUTH EVERY 6 HOURS AS NEEDED FOR HEADACHE 10 tablet 3  . diazepam (VALIUM) 10 MG tablet Take 1 tablet (10 mg) twice a day for one week then, IF NEEDED, increase to 1 tablet (10 mg) in the morning and 2 tablets (20 mg) at bedtime 90 tablet 4  . gabapentin (NEURONTIN) 300 MG capsule Take 2 capsules (600 mg total) by mouth 3 (three) times daily. 180 capsule 2  . HYDROcodone-homatropine (HYDROMET) 5-1.5 MG/5ML syrup Take 5 mLs by mouth every 4 (four) hours as needed. 240 mL 0  . sodium chloride (OCEAN) 0.65 % SOLN nasal spray Place 1 spray into both nostrils as needed for congestion. 60 mL 0  . solifenacin (VESICARE) 5 MG tablet Take 1 tablet (5 mg total) by mouth daily. 30 tablet 5  . venlafaxine XR (EFFEXOR-XR) 75 MG 24 hr capsule 1  qam 90 capsule 1  . traMADol (ULTRAM) 50 MG tablet Take 1 tablet (50 mg total) by mouth every 6 (six) hours as needed. 30 tablet 0   No current facility-administered medications for this visit.     Patient Active Problem List   Diagnosis Date Noted  . Primary osteoarthritis of right knee 08/06/2019  . 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 Other meniscus derangements, posterior horn of medial meniscus, left knee 02/19/2019  . Bucket handle tear of lateral meniscus 02/19/2019  . Unilateral primary osteoarthritis, right knee 12/13/2018  . Panic disorder 12/13/2018  . Chronic headaches 12/13/2018  . Weakness 09/06/2017  . Depression 09/06/2017  . 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  . Generalized anxiety disorder with panic attacks 09/06/2017  . Right hand pain   . Wrist swelling   . Cellulitis of right upper extremity 04/11/2016  . Elbow pain 07/20/2015  . Anxiety 07/20/2015  . Tachycardia 07/04/2015  . 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HCC)   . Skin ulcer of upper arm, limited to breakdown of skin (HCC) 07/01/2015   Past Medical History:  Diagnosis Date   . Arthritis    "knees" (09/06/2017)  . Bullous pemphigus   . Cat bite 06/2014   to left elbow  . History of blood transfusion 1988   "when I had my baby"  . Muscle weakness of lower extremity 2001; 09/05/2017   "resolved after a couple weeks; ?" (09/06/2017)  . 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 Poisoning, snake bite 04/08/2016   "[place]; RUE"  . PONV (postoperative nausea and vomiting)   . S/P right knee surgery   . Situational anxiety   . Staph infection ~ 2015   "left elbow and finger"  Family History  Problem Relation Age of Onset  . Liver disease Mother   . Dementia Mother   . Cirrhosis Mother   . Prostate cancer Father   . Colon cancer Maternal Grandmother   . Ovarian cancer Maternal Aunt     Past Surgical History:  Procedure Laterality Date  . APPENDECTOMY  ~ 1987  . APPLICATION OF A-CELL OF EXTREMITY Left 08/05/2015   Procedure: APPLICATION OF A-CELL OF EXTREMITY;  Surgeon: Peggye Formlaire S Dillingham, DO;  Location: [facility];  Service: Plastics;  Laterality: Left;  . BREAST SURGERY Right 1990   "milk duct taken out"  . CHONDROPLASTY Right 02/19/2019   Procedure: CHONDROPLASTY; EXCISION EXOSTOSIS;  Surgeon: Valeria BatmanWhitfield, Peter W, MD;  Location: [facility];  Service: Orthopedics;  Laterality: Right;  . DEBRIDEMENT AND CLOSURE WOUND Left 07/01/2015   Procedure: LEFT ELBOW EXCISION OF WOUND WITH PRIMARY CLOSURE 2X5 CM ;  Surgeon: Peggye Formlaire S Dillingham, DO;  Location: [facility];  Service: Plastics;  Laterality: Left;  . ELBOW SURGERY Left X 23 in [place] <06/2015   from a cat bite; all I&D  . I&D EXTREMITY Left 07/08/2015   Procedure: IRRIGATION AND DEBRIDEMENT EXTREMITY, DRAINAGE OF LEFT ARM WOUND, A-CELL PLACEMENT, WOUND VAC PLACEMENT;  Surgeon: Alena Billslaire S Dillingham, DO;  Location: [facility];  Service: Plastics;  Laterality: Left;  . INCISION AND DRAINAGE OF WOUND Left 08/05/2015   Procedure: IRRIGATION AND DEBRIDEMENT LEFT ELBOW  WOUND, PLACEMENT OF ACELL;  Surgeon: Peggye Formlaire S Dillingham, DO;  Location: [facility];  Service: Plastics;  Laterality: Left;  . KNEE ARTHROSCOPY WITH MEDIAL MENISECTOMY Right 02/19/2019   Procedure: RIGHT KNEE ARTHROSCOPY, DEBRIDEMENT, PARTIAL MEDIAL AND LATERAL MENISECTOMY;  Surgeon: Valeria BatmanWhitfield, Peter W, MD;  Location: [facility];  Service: Orthopedics;  Laterality: Right;  . LAPAROSCOPIC CHOLECYSTECTOMY  1998  . SKIN GRAFT Left 2016   took from anterior thigh; placed at elbow  . TONSILLECTOMY  ~ 2000  . TOTAL ABDOMINAL HYSTERECTOMY  2003  . WRIST SURGERY Right 01/2016   Social History   Occupational History  . Occupation: unemployed  Tobacco Use  . Smoking status: Never Smoker  . Smokeless tobacco: Never Used  Substance and Sexual Activity  . Alcohol use: Never    Alcohol/week: 0.0 standard drinks    Frequency: Never    Comment: social  . Drug use: No  . Sexual activity: Not Currently

## 2019-08-13 ENCOUNTER — Other Ambulatory Visit: Payer: Self-pay

## 2019-08-13 NOTE — Telephone Encounter (Signed)
Please advise 

## 2019-08-13 NOTE — Telephone Encounter (Signed)
Have not seen in months-no refill

## 2019-08-13 NOTE — Telephone Encounter (Signed)
Sabrina [name] saw her last week.

## 2019-08-13 NOTE — Telephone Encounter (Signed)
REFER TO BRIAN

## 2019-08-14 ENCOUNTER — Other Ambulatory Visit: Payer: Self-pay

## 2019-08-14 MED ORDER — TRAMADOL HCL 50 MG PO TABS: 50.0000 mg | ORAL_TABLET | Freq: Two times a day (BID) | ORAL | 0 refills | Status: DC | PRN

## 2019-08-14 NOTE — Telephone Encounter (Signed)
Done

## 2019-08-14 NOTE — Telephone Encounter (Signed)
Please advise 

## 2019-08-14 NOTE — Telephone Encounter (Signed)
Sabrina [name] patient

## 2019-08-21 ENCOUNTER — Encounter: Payer: Self-pay | Admitting: Orthopaedic Surgery

## 2019-08-21 ENCOUNTER — Ambulatory Visit (INDEPENDENT_AMBULATORY_CARE_PROVIDER_SITE_OTHER): Payer: Self-pay | Admitting: Orthopaedic Surgery

## 2019-08-21 ENCOUNTER — Other Ambulatory Visit: Payer: Self-pay

## 2019-08-21 VITALS — Ht 64.0 in | Wt 178.0 lb

## 2019-08-21 DIAGNOSIS — M1711 Unilateral primary osteoarthritis, right knee: Secondary | ICD-10-CM

## 2019-08-21 MED ORDER — LIDOCAINE HCL 1 % IJ SOLN
2.0000 mL | INTRAMUSCULAR | Status: AC | PRN
  Administered 2019-08-21: 2 mL

## 2019-08-21 MED ORDER — METHYLPREDNISOLONE ACETATE 40 MG/ML IJ SUSP
80.0000 mg | INTRAMUSCULAR | Status: AC | PRN
  Administered 2019-08-21: 80 mg via INTRA_ARTICULAR

## 2019-08-21 MED ORDER — BUPIVACAINE HCL 0.5 % IJ SOLN
2.0000 mL | INTRAMUSCULAR | Status: AC | PRN
  Administered 2019-08-21: 2 mL via INTRA_ARTICULAR

## 2019-08-21 NOTE — Progress Notes (Signed)
Office Visit Note   Patient: Sabrina [name]           Date of Birth: 1966/05/26           MRN: [id number] Visit Date: 08/21/2019              Requested by: Philip Aspen, Limmie Patricia, MD [address] [place],  [place] [address] PCP: Philip Aspen, Limmie Patricia, MD   Assessment & Plan: Visit Diagnoses:  1. Primary osteoarthritis of right knee     Plan: 6 months status post right knee arthroscopy with a partial medial lateral meniscectomy.  At the time of arthroscopy there was considerable degenerative change in both medial lateral compartments.  Has taken pain medicines off and on since the surgery.  Spent some time in [place] with her father would had a stroke and eventually died.  Now back in [place] and has a job working as a Armed forces training and education officer.  X-rays were obtained last month by Ar[facility] John demonstrating significant collapse of the medial compartment where she has increased varus and essentially bone-on-bone.  There are degenerative changes in the patellofemoral and lateral compartments as well.  Long discussion today about her diagnosis and what she can expect over time.  We will try a spider brace and cortisone injection.  She certainly is a candidate for total knee replacement.  We will avoid analgesics.  Will be working on exercises and weight loss  Follow-Up Instructions: Return if symptoms worsen or fail to improve.   Orders:  Orders Placed This Encounter  Procedures  . Large Joint Inj: R knee   No orders of the defined types were placed in this encounter.     Procedures: Large Joint Inj: R knee on 08/21/2019 3:12 PM Indications: pain and diagnostic evaluation Details: 25 G 1.5 in needle, anteromedial approach  Arthrogram: No  Medications: 2 mL lidocaine 1 %; 2 mL bupivacaine 0.5 %; 80 mg methylPREDNISolone acetate 40 MG/ML Procedure, treatment alternatives, risks and benefits explained, specific risks discussed. Consent was given by the patient. Immediately prior to  procedure a time out was called to verify the correct patient, procedure, equipment, support staff and site/side marked as required. Patient was prepped and draped in the usual sterile fashion.       Clinical Data: No additional findings.   Subjective: Chief Complaint  Patient presents with  . Right Knee - Pain  Patient presents today for follow up on her right knee. She had a right knee arthroscopy on 02/19/2019. She has had continued pain. She is here to discuss possible further surgery. She is taking tramadol for pain.   HPI  Review of Systems   Objective: Vital Signs: Ht 5\' 4"  (1.626 m)   Wt 178 lb (80.7 kg)   BMI 30.55 kg/m   Physical Exam Constitutional:      Appearance: She is well-developed.  Eyes:     Pupils: Pupils are equal, round, and reactive to light.  Pulmonary:     Effort: Pulmonary effort is normal.  Skin:    General: Skin is warm and dry.  Neurological:     Mental Status: She is alert and oriented to person, place, and time.  Psychiatric:        Behavior: Behavior normal.     Ortho Exam right knee was not hot red warm or swollen.  Increased varus.  Predominant medial joint pain.  Some patellar crepitation.  Full extension of flexed over 110 degrees without instability.  No ankle edema  neurologically intact  Specialty Comments:  No specialty comments available.  Imaging: No results found.   PMFS History: Patient Active Problem List   Diagnosis Date Noted  . Primary osteoarthritis of right knee 08/06/2019  . 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 Other meniscus derangements, posterior horn of medial meniscus, left knee 02/19/2019  . Bucket handle tear of lateral meniscus 02/19/2019  . Unilateral primary osteoarthritis, right knee 12/13/2018  . Panic disorder 12/13/2018  . Chronic headaches 12/13/2018  . Weakness 09/06/2017  . Depression 09/06/2017  . 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  . Generalized anxiety disorder with panic attacks 09/06/2017  .  Right hand pain   . Wrist swelling   . Cellulitis of right upper extremity 04/11/2016  . Elbow pain 07/20/2015  . Anxiety 07/20/2015  . Tachycardia 07/04/2015  . 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[facility])   . Skin ulcer of upper arm, limited to breakdown of skin ([facility]) 07/01/2015   Past Medical History:  Diagnosis Date  . Arthritis    "knees" (09/06/2017)  . Bullous pemphigus   . Cat bite 06/2014   to left elbow  . History of blood transfusion 1988   "when I had my baby"  . Muscle weakness of lower extremity 2001; 09/05/2017   "resolved after a couple weeks; ?" (09/06/2017)  . 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[facility])   . Poisoning, snake bite 04/08/2016   "[place]; RUE"  . PONV (postoperative nausea and vomiting)   . S/P right knee surgery   . Situational anxiety   . Staph infection ~ 2015   "left elbow and finger"    Family History  Problem Relation Age of Onset  . Liver disease Mother   . Dementia Mother   . Cirrhosis Mother   . Prostate cancer Father   . Colon cancer Maternal Grandmother   . Ovarian cancer Maternal Aunt     Past Surgical History:  Procedure Laterality Date  . APPENDECTOMY  ~ 1987  . APPLICATION OF A-CELL OF EXTREMITY Left 08/05/2015   Procedure: APPLICATION OF A-CELL OF EXTREMITY;  Surgeon: Wallace Going, DO;  Location: [facility];  Service: Plastics;  Laterality: Left;  . BREAST SURGERY Right 1990   "milk duct taken out"  . CHONDROPLASTY Right 02/19/2019   Procedure: CHONDROPLASTY; EXCISION EXOSTOSIS;  Surgeon: Garald Balding, MD;  Location: [facility];  Service: Orthopedics;  Laterality: Right;  . DEBRIDEMENT AND CLOSURE WOUND Left 07/01/2015   Procedure: LEFT ELBOW EXCISION OF WOUND WITH PRIMARY CLOSURE 2X5 CM ;  Surgeon: Wallace Going, DO;  Location: [facility];  Service: Plastics;  Laterality: Left;  . ELBOW SURGERY Left X 23 in [place] <06/2015   from a cat bite; all I&D  . I&D EXTREMITY Left 07/08/2015    Procedure: IRRIGATION AND DEBRIDEMENT EXTREMITY, DRAINAGE OF LEFT ARM WOUND, A-CELL PLACEMENT, WOUND VAC PLACEMENT;  Surgeon: Loel Lofty Dillingham, DO;  Location: [facility];  Service: Plastics;  Laterality: Left;  . INCISION AND DRAINAGE OF WOUND Left 08/05/2015   Procedure: IRRIGATION AND DEBRIDEMENT LEFT ELBOW WOUND, PLACEMENT OF ACELL;  Surgeon: Wallace Going, DO;  Location: [facility];  Service: Plastics;  Laterality: Left;  . KNEE ARTHROSCOPY WITH MEDIAL MENISECTOMY Right 02/19/2019   Procedure: RIGHT KNEE ARTHROSCOPY, DEBRIDEMENT, PARTIAL MEDIAL AND LATERAL MENISECTOMY;  Surgeon: Garald Balding, MD;  Location: [facility];  Service: Orthopedics;  Laterality: Right;  . LAPAROSCOPIC CHOLECYSTECTOMY  1998  . SKIN GRAFT Left 2016   took  from anterior thigh; placed at elbow  . TONSILLECTOMY  ~ 2000  . TOTAL ABDOMINAL HYSTERECTOMY  2003  . WRIST SURGERY Right 01/2016   Social History   Occupational History  . Occupation: unemployed  Tobacco Use  . Smoking status: Never Smoker  . Smokeless tobacco: Never Used  Substance and Sexual Activity  . Alcohol use: Never    Alcohol/week: 0.0 standard drinks    Frequency: Never    Comment: social  . Drug use: No  . Sexual activity: Not Currently

## 2019-08-28 MED FILL — BUTALB-ACETAMIN-CAFF 50-325: 50-325-40 | 2 days supply | Qty: 10 | Fill #3

## 2019-08-28 MED FILL — GABAPENTIN 300 MG CAPSULE: 300 | 30 days supply | Qty: 180 | Fill #1

## 2019-08-28 MED FILL — DIAZEPAM 10 MG TABS: 10 | 32 days supply | Qty: 90 | Fill #2

## 2019-08-28 MED FILL — VENLAFAXINE HCL ER 75 MG CA: 75 | 30 days supply | Qty: 30 | Fill #0

## 2019-08-29 ENCOUNTER — Telehealth (INDEPENDENT_AMBULATORY_CARE_PROVIDER_SITE_OTHER): Payer: Self-pay | Admitting: Family Medicine

## 2019-08-29 ENCOUNTER — Encounter: Payer: Self-pay | Admitting: Family Medicine

## 2019-08-29 ENCOUNTER — Other Ambulatory Visit: Payer: Self-pay

## 2019-08-29 ENCOUNTER — Telehealth: Payer: Self-pay

## 2019-08-29 ENCOUNTER — Telehealth: Payer: Self-pay | Admitting: *Deleted

## 2019-08-29 VITALS — Temp 99.7°F | Wt 173.0 lb

## 2019-08-29 DIAGNOSIS — J40 Bronchitis, not specified as acute or chronic: Secondary | ICD-10-CM

## 2019-08-29 DIAGNOSIS — R059 Cough, unspecified: Secondary | ICD-10-CM

## 2019-08-29 DIAGNOSIS — R05 Cough: Secondary | ICD-10-CM

## 2019-08-29 MED ORDER — FLOVENT HFA 44 MCG/ACT IN AERO: 2.0000 | INHALATION_SPRAY | Freq: Two times a day (BID) | RESPIRATORY_TRACT | 0 refills | Status: DC

## 2019-08-29 NOTE — Telephone Encounter (Signed)
Pt has been scheduled for VV with Dr.Kim

## 2019-08-29 NOTE — Telephone Encounter (Signed)
Can you please check with pharmacy on inhaled steroid options/equivalent dosing that would be cheaper for her? Ok to rx and let pt know. The other options would be to try prednisone 20mg  daily for 5 days - but she did have anxiety with higher doses in the past she reports and she would have to check with her orthopedic specialist to see if that is ok as she was worried about that and some of the issues her orthopedic specialist is treating. Thanks!

## 2019-08-29 NOTE — Telephone Encounter (Signed)
Please let her know options would be to print inhaler rx and she could try other pharmacies - sometimes [facility], [facility], etc can be cheaper. Can try OTC meds for cough along with prn albuterol q 4-6 hours for bronchitis. If any worsening, discolored mucus, not improving, etc would need re-evaluation w/ PCP.

## 2019-08-29 NOTE — Telephone Encounter (Signed)
Copied from [place] [phone]. Topic: General - Other >> Aug 29, 2019  8:53 AM Keene Breath wrote: Reason for CRM: Patient called to schedule an appt. For today over the phone for bronchitis.  CB# [phone].  Patient would like an appt. Today if possible.

## 2019-08-29 NOTE — Progress Notes (Signed)
Virtual Visit via Video Note  I connected with Sabrina [name]  on 08/29/19 at 10:00 AM EST by a video enabled telemedicine application and verified that I am speaking with the correct person using two identifiers.  Location patient: home Location provider:work or home office Persons participating in the virtual visit: patient, provider  I discussed the limitations of evaluation and management by telemedicine and the availability of in person appointments. The patient expressed understanding and agreed to proceed.   HPI:  Acute virtual visit for "bronchitis": -Started: 2 days ago -Symptoms: cough, drainage in the throat, chest congestion, productive cough (mucus not discolored), a bit of laryngitis, mild wheezing with cough sometimes -Denies: CP, SOB, fever over 100, hemoptysis, tick bite, known COVID exposure, known strep exposure, known flu exposures, discolored mucus, body ahces -travel: none -current treatments:albuterol, robitussin, delsum, musinex -medication allergies: -reports had COVID19 in the end of October, she reports she has bronchitis several times per year without a diagnosis of asthma but does use albuterol when sick -no known seasonal allergies  ROS: See pertinent positives and negatives per HPI.  Past Medical History:  Diagnosis Date  . Arthritis    "knees" (09/06/2017)  . Bullous pemphigus   . Cat bite 06/2014   to left elbow  . History of blood transfusion 1988   "when I had my baby"  . Muscle weakness of lower extremity 2001; 09/05/2017   "resolved after a couple weeks; ?" (09/06/2017)  . 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[place])   . Poisoning, snake bite 04/08/2016   "[place]; RUE"  . PONV (postoperative nausea and vomiting)   . S/P right knee surgery   . Situational anxiety   . Staph infection ~ 2015   "left elbow and finger"    Past Surgical History:  Procedure Laterality Date  . APPENDECTOMY  ~ 1987  . APPLICATION OF A-CELL OF EXTREMITY Left 08/05/2015   Procedure: APPLICATION OF A-CELL OF EXTREMITY;  Surgeon: Wallace Going, DO;  Location: [facility];  Service: Plastics;  Laterality: Left;  . BREAST SURGERY Right 1990   "milk duct taken out"  . CHONDROPLASTY Right 02/19/2019   Procedure: CHONDROPLASTY; EXCISION EXOSTOSIS;  Surgeon: Garald Balding, MD;  Location: [facility];  Service: Orthopedics;  Laterality: Right;  . DEBRIDEMENT AND CLOSURE WOUND Left 07/01/2015   Procedure: LEFT ELBOW EXCISION OF WOUND WITH PRIMARY CLOSURE 2X5 CM ;  Surgeon: Wallace Going, DO;  Location: [facility];  Service: Plastics;  Laterality: Left;  . ELBOW SURGERY Left X 23 in [place] <06/2015   from a cat bite; all I&D  . I & D EXTREMITY Left 07/08/2015   Procedure: IRRIGATION AND DEBRIDEMENT EXTREMITY, DRAINAGE OF LEFT ARM WOUND, A-CELL PLACEMENT, WOUND VAC PLACEMENT;  Surgeon: Loel Lofty Dillingham, DO;  Location: [facility];  Service: Plastics;  Laterality: Left;  . INCISION AND DRAINAGE OF WOUND Left 08/05/2015   Procedure: IRRIGATION AND DEBRIDEMENT LEFT ELBOW WOUND, PLACEMENT OF ACELL;  Surgeon: Wallace Going, DO;  Location: [facility];  Service: Plastics;  Laterality: Left;  . KNEE ARTHROSCOPY WITH MEDIAL MENISECTOMY Right 02/19/2019   Procedure: RIGHT KNEE ARTHROSCOPY, DEBRIDEMENT, PARTIAL MEDIAL AND LATERAL MENISECTOMY;  Surgeon: Garald Balding, MD;  Location: [facility];  Service: Orthopedics;  Laterality: Right;  . LAPAROSCOPIC CHOLECYSTECTOMY  1998  . SKIN GRAFT Left 2016   took from anterior thigh; placed at elbow  . TONSILLECTOMY  ~ 2000  . TOTAL ABDOMINAL HYSTERECTOMY  2003  .  WRIST SURGERY Right 01/2016    Family History  Problem Relation Age of Onset  . Liver disease Mother   . Dementia Mother   . Cirrhosis Mother   . Prostate cancer Father   . Colon cancer Maternal Grandmother   . Ovarian cancer Maternal Aunt     SOCIAL HX: see hpi   Current  Outpatient Medications:  .  butalbital-acetaminophen-caffeine (FIORICET) 50-325-40 MG tablet, TAKE 1 TABLET BY MOUTH EVERY 6 HOURS AS NEEDED FOR HEADACHE, Disp: 10 tablet, Rfl: 3 .  diazepam (VALIUM) 10 MG tablet, Take 1 tablet (10 mg) twice a day for one week then, IF NEEDED, increase to 1 tablet (10 mg) in the morning and 2 tablets (20 mg) at bedtime, Disp: 90 tablet, Rfl: 4 .  diclofenac Sodium (VOLTAREN) 1 % GEL, Apply topically as needed (knee pain)., Disp: , Rfl:  .  sodium chloride (OCEAN) 0.65 % SOLN nasal spray, Place 1 spray into both nostrils as needed for congestion., Disp: 60 mL, Rfl: 0 .  venlafaxine XR (EFFEXOR-XR) 75 MG 24 hr capsule, 1  qam, Disp: 90 capsule, Rfl: 1 .  fluticasone (FLOVENT HFA) 44 MCG/ACT inhaler, Inhale 2 puffs into the lungs 2 (two) times daily., Disp: 1 Inhaler, Rfl: 0 .  gabapentin (NEURONTIN) 300 MG capsule, Take 2 capsules (600 mg total) by mouth 3 (three) times daily., Disp: 180 capsule, Rfl: 2  EXAM:  VITALS per patient if applicable: Today's Vitals   08/29/19 0916  Temp: 99.7 F (37.6 C)  Weight: 173 lb (78.5 kg)   Body mass index is 29.7 kg/m.  GENERAL: alert, oriented, appears well and in no acute distress  HEENT: atraumatic, conjunttiva clear, no obvious abnormalities on inspection of external nose and ears  NECK: normal movements of the head and neck  LUNGS: on inspection no signs of respiratory distress, breathing rate appears normal, no obvious gross SOB, gasping or wheezing  CV: no obvious cyanosis  MS: moves all visible extremities without noticeable abnormality  PSYCH/NEURO: pleasant and cooperative, no obvious depression or anxiety, speech and thought processing grossly intact  ASSESSMENT AND PLAN:  Discussed the following assessment and plan:  Cough  Bronchitis  -we discussed possible serious and likely etiologies, options for evaluation and workup, limitations of telemedicine visit vs in person visit, treatment,  treatment risks and precautions. Pt prefers to treat via telemedicine empirically rather then risking or undertaking an in person visit at this moment. Suspect laryngitis, possible bronchitis vs other. She has used prednisone before and it helped, but made her anxious. Opted to try inhaled steroid per her preference after discussion options w prn alb. Discussed various options for cough OTC. Reports she was told cant have COVID for 3 months since had it in October. Advised, it is unlikely, but still advise isolation for 7 to ten days along with feeling better for 3 days if ends isolation prior to 10 days. She agreed to isolation. Patient agrees to seek prompt medical care if worsening, new symptoms arise, or if is not improving with treatment.   I discussed the assessment and treatment plan with the patient. The patient was provided an opportunity to ask questions and all were answered. The patient agreed with the plan and demonstrated an understanding of the instructions.   The patient was advised to call back or seek an in-person evaluation if the symptoms worsen or if the condition fails to improve as anticipated.   Terressa Koyanagi, DO

## 2019-08-29 NOTE — Telephone Encounter (Signed)
I called [facility] and spoke with the pharmacist Suezanne Jacquet and he stated there are no other inhalers equivalent that are cheaper.  I spoke with the pt and informed her of this and the message below.  Patient stated she spoke with a nurse at the orthopedic office and was told it would not be a good idea for her to be on steroid as she may be having a total knee replacement soon.  Message sent to Dr Maudie Mercury.

## 2019-08-29 NOTE — Telephone Encounter (Signed)
Spoke to pt and she stated she is having chest congestion. Pt stated she has a hx of Bronchitis and think this may be sx. Pt articulated that she doesn't think that she has Covid. Pt claimed that she was dx with Covid in Nov. And was told that she cannot catch Covid again 3 months after exposure. Pt is wanting to know what she can do for issue.

## 2019-08-29 NOTE — Telephone Encounter (Signed)
Copied from [place] [id number]. Topic: General - Other >> Aug 29, 2019  3:57 PM Leward Quan A wrote: Reason for CRM: Patient called to say that the inhaler that was ordered for her is over $250 and that she can not afford that. She is asking if there is something more reasonable that can be called in for her. She is also asking for a call back at Ph# [phone]

## 2019-08-30 ENCOUNTER — Ambulatory Visit (INDEPENDENT_AMBULATORY_CARE_PROVIDER_SITE_OTHER): Payer: Self-pay

## 2019-08-30 ENCOUNTER — Ambulatory Visit (HOSPITAL_COMMUNITY)
Admission: EM | Admit: 2019-08-30 | Discharge: 2019-08-30 | Disposition: A | Discharge status: Home / Self Care | Payer: Self-pay | Attending: Emergency Medicine | Admitting: Emergency Medicine

## 2019-08-30 ENCOUNTER — Ambulatory Visit: Payer: Self-pay

## 2019-08-30 ENCOUNTER — Encounter: Payer: Self-pay | Admitting: Internal Medicine

## 2019-08-30 ENCOUNTER — Encounter: Payer: Self-pay | Admitting: Family Medicine

## 2019-08-30 ENCOUNTER — Encounter (HOSPITAL_COMMUNITY): Payer: Self-pay

## 2019-08-30 ENCOUNTER — Other Ambulatory Visit: Payer: Self-pay | Admitting: Family Medicine

## 2019-08-30 DIAGNOSIS — R05 Cough: Secondary | ICD-10-CM

## 2019-08-30 DIAGNOSIS — J069 Acute upper respiratory infection, unspecified: Secondary | ICD-10-CM

## 2019-08-30 DIAGNOSIS — Z8619 Personal history of other infectious and parasitic diseases: Secondary | ICD-10-CM

## 2019-08-30 MED ORDER — DEXAMETHASONE SODIUM PHOSPHATE 10 MG/ML IJ SOLN
10.0000 mg | Freq: Once | INTRAMUSCULAR | Status: AC
  Administered 2019-08-30: 10 mg via INTRAMUSCULAR

## 2019-08-30 MED ORDER — DEXAMETHASONE SODIUM PHOSPHATE 10 MG/ML IJ SOLN
INTRAMUSCULAR | Status: AC
  Filled 2019-08-30: qty 1

## 2019-08-30 NOTE — Telephone Encounter (Signed)
Responded to patient via MyChart.  Nothing further needed.

## 2019-08-30 NOTE — Discharge Instructions (Addendum)
Your chest xray is normal today.  Push fluids to ensure adequate hydration and keep secretions thin.  Tylenol and/or ibuprofen as needed for pain or fevers.  Continue with daily nasal spray such as flonase.  An expectorant such as mucinex to help with secretions.  Robitussin dm may be helpful.  If symptoms worsen or do not improve in the next week to return to be seen or to follow up with your PCP.

## 2019-08-30 NOTE — Telephone Encounter (Signed)
Patient calling with constant cough. She was Dx with COVID-19 02/15/2019.  She states that she started to cough 2-3 days ago and has not been able to sleep at night.  She had a virtual visit yesterday and today went to UC.  There they did a CXR and gave her a  Dexamethasone 10mg  injection. She was told to treat with OTC medications.  She has no fever but chest is sore from coughing.  She states she is losing her voice.  She has Hx of bronchitis this time every year. She is requesting Rx for cough. Home care instructions read to patient.  She verbalized understanding. Call to office. Note will be routed for possible Rx for cough.  Reason for Disposition . Cough  Answer Assessment - Initial Assessment Questions 1. ONSET: "When did the cough begin?"      Started 2-3 days ago 2. SEVERITY: "How bad is the cough today?"     constant 3. RESPIRATORY DISTRESS: "Describe your breathing."     None 4. FEVER: "Do you have a fever?" If so, ask: "What is your temperature, how was it measured, and when did it start?"     no 5. HEMOPTYSIS: "Are you coughing up any blood?" If so ask: "How much?" (flecks, streaks, tablespoons, etc.)     no 6. TREATMENT: "What have you done so far to treat the cough?" (e.g., meds, fluids, humidifier)     Steroid injection, mult cough medications 7. CARDIAC HISTORY: "Do you have any history of heart disease?" (e.g., heart attack, congestive heart failure)     no 8. LUNG HISTORY: "Do you have any history of lung disease?"  (e.g., pulmonary embolus, asthma, emphysema)    no 9. PE RISK FACTORS: "Do you have a history of blood clots?" (or: recent major surgery, recent prolonged travel, bedridden)    no 10. OTHER SYMPTOMS: "Do you have any other symptoms? (e.g., runny nose, wheezing, chest pain)     No losing voice 11. PREGNANCY: "Is there any chance you are pregnant?" "When was your last menstrual period?"     N/A 12. TRAVEL: "Have you traveled out of the country in the last  month?" (e.g., travel history, exposures)      N/A  Protocols used: COUGH - ACUTE NON-PRODUCTIVE-A-AH

## 2019-08-30 NOTE — ED Triage Notes (Signed)
Patient presents to Urgent Care with complaints of cough that is so bad she cannot sleep since about 3 days ago. Patient reports she was covid positive at the end of October and so does not think she can get it again. Pt had virtual appt and they recommended steroids but because of a bad knee she cannot have steroids. Pt has been using otc cough relievers and they have not helped. Pt thinks she has bronchitis, voice sounds hoarse, no difficulty swallowing or breathing.

## 2019-08-30 NOTE — Telephone Encounter (Signed)
Spoke with the pt and informed her of the message below.  Patient stated she will check GoodRx, call around for prices and call me back if she finds something cheaper.

## 2019-08-30 NOTE — ED Provider Notes (Signed)
[facility]    CSN: [id number] Arrival date & time: 08/30/19  1133      History   Chief Complaint Chief Complaint  Patient presents with  . Appointment  . Cough    HPI Sabrina [name] is a 53 y.o. female.   Sabrina [name] presents with complaints of cough. Three days ago started. History of bronchitis, typically yearly incidence for her. She did have covid the end of October. At night the cough is worse. Activity triggers cough. Has been taking over the counter medications which have not helped. Worse in the evening. Poor sleep. No fevers. No nasal drainage. Sore throat from coughing. No headache or body aches. History of migraines, but hasn't had in a while. No gi symptoms. No shortness of breath. Coughing causes sensation like she will throw up. No asthma history, doesn't smoke. No known ill contacts.     ROS per HPI, negative if not otherwise mentioned.      Past Medical History:  Diagnosis Date  . Arthritis    "knees" (09/06/2017)  . Bullous pemphigus   . Cat bite 06/2014   to left elbow  . History of blood transfusion 1988   "when I had my baby"  . Muscle weakness of lower extremity 2001; 09/05/2017   "resolved after a couple weeks; ?" (09/06/2017)  . 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 Poisoning, snake bite 04/08/2016   "[place]; RUE"  . PONV (postoperative nausea and vomiting)   . S/P right knee surgery   . Situational anxiety   . Staph infection ~ 2015   "left elbow and finger"    Patient Active Problem List   Diagnosis Date Noted  . Primary osteoarthritis of right knee 08/06/2019  . 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 Other meniscus derangements, posterior horn of medial meniscus, left knee 02/19/2019  . Bucket handle tear of lateral meniscus 02/19/2019  . Unilateral primary osteoarthritis, right knee 12/13/2018  . Panic disorder 12/13/2018  . Chronic headaches 12/13/2018  . Weakness 09/06/2017  . Depression 09/06/2017  . Bullous pemphigoid  09/06/2017  . Generalized anxiety disorder with panic attacks 09/06/2017  . Right hand pain   . Wrist swelling   . Cellulitis of right upper extremity 04/11/2016  . Elbow pain 07/20/2015  . Anxiety 07/20/2015  . Tachycardia 07/04/2015  . 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 Skin ulcer of upper arm, limited to breakdown of skin ([place]) 07/01/2015    Past Surgical History:  Procedure Laterality Date  . APPENDECTOMY  ~ 1987  . APPLICATION OF A-CELL OF EXTREMITY Left 08/05/2015   Procedure: APPLICATION OF A-CELL OF EXTREMITY;  Surgeon: Wallace Going, DO;  Location: [facility];  Service: Plastics;  Laterality: Left;  . BREAST SURGERY Right 1990   "milk duct taken out"  . CHONDROPLASTY Right 02/19/2019   Procedure: CHONDROPLASTY; EXCISION EXOSTOSIS;  Surgeon: Garald Balding, MD;  Location: [facility];  Service: Orthopedics;  Laterality: Right;  . DEBRIDEMENT AND CLOSURE WOUND Left 07/01/2015   Procedure: LEFT ELBOW EXCISION OF WOUND WITH PRIMARY CLOSURE 2X5 CM ;  Surgeon: Wallace Going, DO;  Location: [facility];  Service: Plastics;  Laterality: Left;  . ELBOW SURGERY Left X 23 in [place] <06/2015   from a cat bite; all I&D  . I & D EXTREMITY Left 07/08/2015   Procedure: IRRIGATION AND DEBRIDEMENT EXTREMITY, DRAINAGE OF LEFT ARM WOUND, A-CELL PLACEMENT, WOUND VAC PLACEMENT;  Surgeon: Loel Lofty  Dillingham, DO;  Location: [facility];  Service: Plastics;  Laterality: Left;  . INCISION AND DRAINAGE OF WOUND Left 08/05/2015   Procedure: IRRIGATION AND DEBRIDEMENT LEFT ELBOW WOUND, PLACEMENT OF ACELL;  Surgeon: Peggye Form, DO;  Location: [facility];  Service: Plastics;  Laterality: Left;  . KNEE ARTHROSCOPY WITH MEDIAL MENISECTOMY Right 02/19/2019   Procedure: RIGHT KNEE ARTHROSCOPY, DEBRIDEMENT, PARTIAL MEDIAL AND LATERAL MENISECTOMY;  Surgeon: Valeria Batman, MD;  Location: [facility];  Service: Orthopedics;   Laterality: Right;  . LAPAROSCOPIC CHOLECYSTECTOMY  1998  . SKIN GRAFT Left 2016   took from anterior thigh; placed at elbow  . TONSILLECTOMY  ~ 2000  . TOTAL ABDOMINAL HYSTERECTOMY  2003  . WRIST SURGERY Right 01/2016    OB History   No obstetric history on file.      Home Medications    Prior to Admission medications   Medication Sig Start Date End Date Taking? Authorizing Provider  butalbital-acetaminophen-caffeine (FIORICET) 50-325-40 MG tablet TAKE 1 TABLET BY MOUTH EVERY 6 HOURS AS NEEDED FOR HEADACHE 05/22/19   Everlena Cooper, Adam R, DO  diazepam (VALIUM) 10 MG tablet Take 1 tablet (10 mg) twice a day for one week then, IF NEEDED, increase to 1 tablet (10 mg) in the morning and 2 tablets (20 mg) at bedtime 06/14/19   Plovsky, Earvin Hansen, MD  diclofenac Sodium (VOLTAREN) 1 % GEL Apply topically as needed (knee pain).    [provider]  fluticasone (FLOVENT HFA) 44 MCG/ACT inhaler Inhale 2 puffs into the lungs 2 (two) times daily. 08/29/19   Terressa Koyanagi, DO  gabapentin (NEURONTIN) 300 MG capsule Take 2 capsules (600 mg total) by mouth 3 (three) times daily. 07/18/19 08/17/19  Philip Aspen, Limmie Patricia, MD  sodium chloride (OCEAN) 0.65 % SOLN nasal spray Place 1 spray into both nostrils as needed for congestion. 08/05/19   Michela Pitcher A, PA-C  venlafaxine XR (EFFEXOR-XR) 75 MG 24 hr capsule 1  qam 06/14/19   Archer Asa, MD    Family History Family History  Problem Relation Age of Onset  . Liver disease Mother   . Dementia Mother   . Cirrhosis Mother   . Prostate cancer Father   . Colon cancer Maternal Grandmother   . Ovarian cancer Maternal Aunt     Social History Social History   Tobacco Use  . Smoking status: Never Smoker  . Smokeless tobacco: Never Used  Substance Use Topics  . Alcohol use: Never    Alcohol/week: 0.0 standard drinks    Comment: social  . Drug use: No     Allergies   Zofran [ondansetron hcl], Droperidol, Flexeril [cyclobenzaprine], Morphine  and related, Tessalon [benzonatate], and Voltaren [diclofenac sodium]   Review of Systems Review of Systems   Physical Exam Triage Vital Signs ED Triage Vitals  Enc Vitals Group     BP 08/30/19 1154 102/80     Pulse Rate 08/30/19 1154 96     Resp 08/30/19 1154 16     Temp 08/30/19 1154 98 F (36.7 C)     Temp Source 08/30/19 1154 Oral     SpO2 08/30/19 1154 100 %     Weight --      Height --      Head Circumference --      Peak Flow --      Pain Score 08/30/19 1153 0     Pain Loc --      Pain Edu? --  Excl. in GC? --    No data found.  Updated Vital Signs BP 102/80 (BP Location: Left Arm)   Pulse 96   Temp 98 F (36.7 C) (Oral)   Resp 16   SpO2 100%   Physical Exam Constitutional:      General: She is not in acute distress.    Appearance: She is well-developed.  Cardiovascular:     Rate and Rhythm: Normal rate and regular rhythm.     Heart sounds: Normal heart sounds.  Pulmonary:     Effort: Pulmonary effort is normal.     Breath sounds: Normal breath sounds.  Skin:    General: Skin is warm and dry.  Neurological:     Mental Status: She is alert and oriented to person, place, and time.      UC Treatments / Results  Labs (all labs ordered are listed, but only abnormal results are displayed) Labs Reviewed - No data to display  EKG   Radiology DG Chest 2 View  Result Date: 08/30/2019 CLINICAL DATA:  Cough. EXAM: CHEST - 2 VIEW COMPARISON:  08/05/2019 FINDINGS: The heart size and mediastinal contours are within normal limits. Both lungs are clear. The visualized skeletal structures are unremarkable. IMPRESSION: No active cardiopulmonary disease. Electronically Signed   By: Signa Kellaylor  Stroud M.D.   On: 08/30/2019 12:23    Procedures Procedures (including critical care time)  Medications Ordered in UC Medications  dexamethasone (DECADRON) injection 10 mg (10 mg Intramuscular Given 08/30/19 1304)    Initial Impression / Assessment and Plan / UC  Course  I have reviewed the triage vital signs and the nursing notes.  Pertinent labs & imaging results that were available during my care of the patient were reviewed by me and considered in my medical decision making (see chart for details).     No increased work of breathing. Afebrile. No hypoxia. Chest xray without acute findings. covid approximately 6 weeks ago, unlikely that this is reinfect, deferred repeat testing. History and physical consistent with viral illness.  Supportive cares recommended. IM depomedrol provided. Return precautions provided. Patient verbalized understanding and agreeable to plan.   Final Clinical Impressions(s) / UC Diagnoses   Final diagnoses:  Viral URI with cough     Discharge Instructions     Your chest xray is normal today.  Push fluids to ensure adequate hydration and keep secretions thin.  Tylenol and/or ibuprofen as needed for pain or fevers.  Continue with daily nasal spray such as flonase.  An expectorant such as mucinex to help with secretions.  Robitussin dm may be helpful.  If symptoms worsen or do not improve in the next week to return to be seen or to follow up with your PCP.       ED Prescriptions    None     PDMP not reviewed this encounter.   Georgetta HaberBurky, Tida Saner B, NP 08/30/19 [phone]

## 2019-08-31 ENCOUNTER — Encounter: Payer: Self-pay | Admitting: Emergency Medicine

## 2019-08-31 ENCOUNTER — Other Ambulatory Visit: Payer: Self-pay

## 2019-08-31 ENCOUNTER — Ambulatory Visit
Admission: EM | Admit: 2019-08-31 | Discharge: 2019-08-31 | Disposition: A | Discharge status: Home / Self Care | Payer: Self-pay | Attending: Physician Assistant | Admitting: Physician Assistant

## 2019-08-31 DIAGNOSIS — R05 Cough: Secondary | ICD-10-CM

## 2019-08-31 DIAGNOSIS — R059 Cough, unspecified: Secondary | ICD-10-CM

## 2019-08-31 MED ORDER — IPRATROPIUM BROMIDE 0.06 % NA SOLN: 2.0000 | Freq: Four times a day (QID) | NASAL | 0 refills | Status: DC

## 2019-08-31 MED FILL — IPRATROPIUM 0.06% SPRAY: 0.06 | 15 days supply | Qty: 15 | Fill #0

## 2019-08-31 NOTE — ED Triage Notes (Addendum)
Pt c/o cough, no change from yesterday. Mentioned she used Tussinex cough syrup before. Pt informed we do not prescribe narcotic medicine. Pt verbalized understanding, would still like to see a provider.

## 2019-08-31 NOTE — Discharge Instructions (Signed)
As discussed, can talk with orthopedics about possible short course of prednisone. Otherwise add atrovent to your regimen for possible post nasal drip causing cough. Follow up with PCP for further evaluation if needed.

## 2019-08-31 NOTE — ED Provider Notes (Signed)
[facility]    CSN: [id number] Arrival date & time: 08/31/19  4854      History   Chief Complaint Chief Complaint  Patient presents with  . Cough    HPI Sabrina [name] is a 53 y.o. female.   53 year old female returns to clinic after being seen yesterday for same. At the time, she was given IM decadron. States due to upcoming orthopedic surgery, was told by surgeon to avoid PO prednisone. Has tried otc cough medications without relief. She is hoping to get tussionex for symptoms.   HPI 08/30/2019: Sabrina [name] presents with complaints of cough. Three days ago started. History of bronchitis, typically yearly incidence for her. She did have covid the end of October. At night the cough is worse. Activity triggers cough. Has been taking over the counter medications which have not helped. Worse in the evening. Poor sleep. No fevers. No nasal drainage. Sore throat from coughing. No headache or body aches. History of migraines, but hasn't had in a while. No gi symptoms. No shortness of breath. Coughing causes sensation like she will throw up. No asthma history, doesn't smoke. No known ill contacts.       Past Medical History:  Diagnosis Date  . Arthritis    "knees" (09/06/2017)  . Bullous pemphigus   . Cat bite 06/2014   to left elbow  . History of blood transfusion 1988   "when I had my baby"  . Muscle weakness of lower extremity 2001; 09/05/2017   "resolved after a couple weeks; ?" (09/06/2017)  . 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 Poisoning, snake bite 04/08/2016   "[place]; RUE"  . PONV (postoperative nausea and vomiting)   . S/P right knee surgery   . Situational anxiety   . Staph infection ~ 2015   "left elbow and finger"    Patient Active Problem List   Diagnosis Date Noted  . Primary osteoarthritis of right knee 08/06/2019  . 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 Other meniscus derangements, posterior horn of medial meniscus, left knee 02/19/2019  .  Bucket handle tear of lateral meniscus 02/19/2019  . Unilateral primary osteoarthritis, right knee 12/13/2018  . Panic disorder 12/13/2018  . Chronic headaches 12/13/2018  . Weakness 09/06/2017  . Depression 09/06/2017  . 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  . Generalized anxiety disorder with panic attacks 09/06/2017  . Right hand pain   . Wrist swelling   . Cellulitis of right upper extremity 04/11/2016  . Elbow pain 07/20/2015  . Anxiety 07/20/2015  . Tachycardia 07/04/2015  . 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HCC)   . Skin ulcer of upper arm, limited to breakdown of skin (HCC) 07/01/2015    Past Surgical History:  Procedure Laterality Date  . APPENDECTOMY  ~ 1987  . APPLICATION OF A-CELL OF EXTREMITY Left 08/05/2015   Procedure: APPLICATION OF A-CELL OF EXTREMITY;  Surgeon: Peggye Form, DO;  Location: [facility];  Service: Plastics;  Laterality: Left;  . BREAST SURGERY Right 1990   "milk duct taken out"  . CHONDROPLASTY Right 02/19/2019   Procedure: CHONDROPLASTY; EXCISION EXOSTOSIS;  Surgeon: Valeria Batman, MD;  Location: [facility];  Service: Orthopedics;  Laterality: Right;  . DEBRIDEMENT AND CLOSURE WOUND Left 07/01/2015   Procedure: LEFT ELBOW EXCISION OF WOUND WITH PRIMARY CLOSURE 2X5 CM ;  Surgeon: Peggye Form, DO;  Location: [facility];  Service: Plastics;  Laterality: Left;  . ELBOW SURGERY Left X 23  in [place] <06/2015   from a cat bite; all I&D  . I & D EXTREMITY Left 07/08/2015   Procedure: IRRIGATION AND DEBRIDEMENT EXTREMITY, DRAINAGE OF LEFT ARM WOUND, A-CELL PLACEMENT, WOUND VAC PLACEMENT;  Surgeon: Alena Billslaire S Dillingham, DO;  Location: [facility];  Service: Plastics;  Laterality: Left;  . INCISION AND DRAINAGE OF WOUND Left 08/05/2015   Procedure: IRRIGATION AND DEBRIDEMENT LEFT ELBOW WOUND, PLACEMENT OF ACELL;  Surgeon: Peggye Formlaire S Dillingham, DO;  Location: [facility];  Service: Plastics;  Laterality: Left;    . KNEE ARTHROSCOPY WITH MEDIAL MENISECTOMY Right 02/19/2019   Procedure: RIGHT KNEE ARTHROSCOPY, DEBRIDEMENT, PARTIAL MEDIAL AND LATERAL MENISECTOMY;  Surgeon: Valeria BatmanWhitfield, Peter W, MD;  Location: [facility];  Service: Orthopedics;  Laterality: Right;  . LAPAROSCOPIC CHOLECYSTECTOMY  1998  . SKIN GRAFT Left 2016   took from anterior thigh; placed at elbow  . TONSILLECTOMY  ~ 2000  . TOTAL ABDOMINAL HYSTERECTOMY  2003  . WRIST SURGERY Right 01/2016    OB History   No obstetric history on file.      Home Medications    Prior to Admission medications   Medication Sig Start Date End Date Taking? Authorizing Provider  butalbital-acetaminophen-caffeine (FIORICET) 50-325-40 MG tablet TAKE 1 TABLET BY MOUTH EVERY 6 HOURS AS NEEDED FOR HEADACHE 05/22/19   Everlena CooperJaffe, Adam R, DO  diazepam (VALIUM) 10 MG tablet Take 1 tablet (10 mg) twice a day for one week then, IF NEEDED, increase to 1 tablet (10 mg) in the morning and 2 tablets (20 mg) at bedtime 06/14/19   Plovsky, Earvin HansenGerald, MD  diclofenac Sodium (VOLTAREN) 1 % GEL Apply topically as needed (knee pain).    [provider]  fluticasone (FLOVENT HFA) 44 MCG/ACT inhaler Inhale 2 puffs into the lungs 2 (two) times daily. 08/29/19   Terressa KoyanagiKim, Hannah R, DO  gabapentin (NEURONTIN) 300 MG capsule Take 2 capsules (600 mg total) by mouth 3 (three) times daily. 07/18/19 08/17/19  Henderson CloudHernandez Acosta, Estela Y, MD  ipratropium (ATROVENT) 0.06 % nasal spray Place 2 sprays into both nostrils 4 (four) times daily. 08/31/19   Cathie HoopsYu, Ylonda Storr V, PA-C  sodium chloride (OCEAN) 0.65 % SOLN nasal spray Place 1 spray into both nostrils as needed for congestion. 08/05/19   Michela PitcherFawze, Mina A, PA-C  venlafaxine XR (EFFEXOR-XR) 75 MG 24 hr capsule 1  qam 06/14/19   Archer AsaPlovsky, Gerald, MD    Family History Family History  Problem Relation Age of Onset  . Liver disease Mother   . Dementia Mother   . Cirrhosis Mother   . Prostate cancer Father   . Colon cancer Maternal  Grandmother   . Ovarian cancer Maternal Aunt     Social History Social History   Tobacco Use  . Smoking status: Never Smoker  . Smokeless tobacco: Never Used  Substance Use Topics  . Alcohol use: Never    Alcohol/week: 0.0 standard drinks    Comment: social  . Drug use: No     Allergies   Zofran [ondansetron hcl], Droperidol, Flexeril [cyclobenzaprine], Morphine and related, Tessalon [benzonatate], and Voltaren [diclofenac sodium]   Review of Systems Review of Systems  Reason unable to perform ROS: See HPI as above.     Physical Exam Triage Vital Signs ED Triage Vitals  Enc Vitals Group     BP 08/31/19 0951 100/75     Pulse Rate 08/31/19 0951 93     Resp 08/31/19 0951 18     Temp 08/31/19 0951  97.8 F (36.6 C)     Temp src --      SpO2 08/31/19 0951 97 %     Weight --      Height --      Head Circumference --      Peak Flow --      Pain Score 08/31/19 0950 3     Pain Loc --      Pain Edu? --      Excl. in [place]? --    No data found.  Updated Vital Signs BP 100/75   Pulse 93   Temp 97.8 F (36.6 C)   Resp 18   SpO2 97%   Physical Exam Constitutional:      General: She is not in acute distress.    Appearance: Normal appearance. She is not ill-appearing, toxic-appearing or diaphoretic.  HENT:     Head: Normocephalic and atraumatic.     Mouth/Throat:     Mouth: Mucous membranes are moist.     Pharynx: Oropharynx is clear. Uvula midline.  Cardiovascular:     Rate and Rhythm: Normal rate and regular rhythm.     Heart sounds: Normal heart sounds. No murmur. No friction rub. No gallop.   Pulmonary:     Effort: Pulmonary effort is normal. No accessory muscle usage, prolonged expiration, respiratory distress or retractions.     Comments: Speaking in full sentences without difficulty. Lungs clear to auscultation without adventitious lung sounds. Musculoskeletal:     Cervical back: Normal range of motion and neck supple.  Skin:    General: Skin is warm and  dry.  Neurological:     General: No focal deficit present.     Mental Status: She is alert and oriented to person, place, and time.      UC Treatments / Results  Labs (all labs ordered are listed, but only abnormal results are displayed) Labs Reviewed - No data to display  EKG   Radiology DG Chest 2 View  Result Date: 08/30/2019 CLINICAL DATA:  Cough. EXAM: CHEST - 2 VIEW COMPARISON:  08/05/2019 FINDINGS: The heart size and mediastinal contours are within normal limits. Both lungs are clear. The visualized skeletal structures are unremarkable. IMPRESSION: No active cardiopulmonary disease. Electronically Signed   By: Kerby Moors M.D.   On: 08/30/2019 12:23    Procedures Procedures (including critical care time)  Medications Ordered in UC Medications - No data to display  Initial Impression / Assessment and Plan / UC Course  I have reviewed the triage vital signs and the nursing notes.  Pertinent labs & imaging results that were available during my care of the patient were reviewed by me and considered in my medical decision making (see chart for details).    Patient with overdose risk score of 650 on PDMP. Discussed uncomfortable filling tussionex. ?bronchitis, will have patient clear with orthopedics in regards to prednisone use. Otherwise, will provide additional nasal spray for possible PND causing symptoms. Return precautions given.  Final Clinical Impressions(s) / UC Diagnoses   Final diagnoses:  Cough   ED Prescriptions    Medication Sig Dispense Auth. Provider   ipratropium (ATROVENT) 0.06 % nasal spray Place 2 sprays into both nostrils 4 (four) times daily. 15 mL [name], Fredericka Bottcher V, PA-C     I have reviewed the PDMP during this encounter.   Ok Edwards, PA-C 08/31/19 [phone]

## 2019-09-01 ENCOUNTER — Other Ambulatory Visit: Payer: Self-pay

## 2019-09-01 ENCOUNTER — Emergency Department (HOSPITAL_BASED_OUTPATIENT_CLINIC_OR_DEPARTMENT_OTHER)
Admission: EM | Admit: 2019-09-01 | Discharge: 2019-09-01 | Disposition: A | Discharge status: Home / Self Care | Payer: Self-pay | Attending: Emergency Medicine | Admitting: Emergency Medicine

## 2019-09-01 ENCOUNTER — Encounter (HOSPITAL_BASED_OUTPATIENT_CLINIC_OR_DEPARTMENT_OTHER): Payer: Self-pay | Admitting: Emergency Medicine

## 2019-09-01 DIAGNOSIS — J069 Acute upper respiratory infection, unspecified: Secondary | ICD-10-CM

## 2019-09-01 DIAGNOSIS — Z79899 Other long term (current) drug therapy: Secondary | ICD-10-CM | POA: Insufficient documentation

## 2019-09-01 MED ORDER — HYDROCOD POLST-CPM POLST ER 10-8 MG/5ML PO SUER: 5.0000 mL | Freq: Every day | ORAL | 0 refills | Status: AC

## 2019-09-01 NOTE — ED Triage Notes (Signed)
Pt c/o ongoing cough. Has been seen multiple times for same over the past few days. Using OTC meds without relief and given steroid injection at UC.

## 2019-09-01 NOTE — Discharge Instructions (Addendum)
Prescription given for Tussionex. Take medication as directed and do not operate machinery, drive a car, or work while taking this medication as it can make you drowsy.   Continue the other symptomatic medications you are on to treat your cough.   Please follow up with your primary care provider within 5-7 days for re-evaluation of your symptoms. If you do not have a primary care provider, information for a healthcare clinic has been provided for you to make arrangements for follow up care. Please return to the emergency department for any new or worsening symptoms.

## 2019-09-01 NOTE — ED Provider Notes (Signed)
[facility] EMERGENCY DEPARTMENT Provider Note   CSN: [id number] Arrival date & time: 09/01/19  1322     History Chief Complaint  Patient presents with  . Cough    Sabrina [name] is a 53 y.o. female.  HPI   53 year old female presenting for evaluation of persistent cough.  She was diagnosed with Covid in November and then was diagnosed with bronchitis about a week ago.  She has been trying inhalers, a humidifier in her room and multiple over-the-counter medications but she has had a persistent cough.  During the day it does not affect her too much but at night her cough is very severe and it is causing her not to be able to sleep.  She denies any fevers.  Denies chest pain, shortness of breath, pleuritic pain.  No bilateral lower extremity swelling, calf pain.  No hemoptysis.  No recent surgeries, mission to the hospital, periods of immobility, history of DVT/PE or cancer.  She states she has similar symptoms every year and the symptoms today are consistent with prior she is just hoping to get a prescription for some cough medication so that she can have some relief of her symptoms get some sleep.  Past Medical History:  Diagnosis Date  . Arthritis    "knees" (09/06/2017)  . Bullous pemphigus   . Cat bite 06/2014   to left elbow  . History of blood transfusion 1988   "when I had my baby"  . Muscle weakness of lower extremity 2001; 09/05/2017   "resolved after a couple weeks; ?" (09/06/2017)  . 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 Poisoning, snake bite 04/08/2016   "[place]; RUE"  . PONV (postoperative nausea and vomiting)   . S/P right knee surgery   . Situational anxiety   . Staph infection ~ 2015   "left elbow and finger"    Patient Active Problem List   Diagnosis Date Noted  . Primary osteoarthritis of right knee 08/06/2019  . 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 Other meniscus derangements, posterior horn of medial meniscus, left knee 02/19/2019  . Bucket handle  tear of lateral meniscus 02/19/2019  . Unilateral primary osteoarthritis, right knee 12/13/2018  . Panic disorder 12/13/2018  . Chronic headaches 12/13/2018  . Weakness 09/06/2017  . Depression 09/06/2017  . 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  . Generalized anxiety disorder with panic attacks 09/06/2017  . Right hand pain   . Wrist swelling   . Cellulitis of right upper extremity 04/11/2016  . Elbow pain 07/20/2015  . Anxiety 07/20/2015  . Tachycardia 07/04/2015  . 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HCC)   . Skin ulcer of upper arm, limited to breakdown of skin (HCC) 07/01/2015    Past Surgical History:  Procedure Laterality Date  . APPENDECTOMY  ~ 1987  . APPLICATION OF A-CELL OF EXTREMITY Left 08/05/2015   Procedure: APPLICATION OF A-CELL OF EXTREMITY;  Surgeon: Peggye Formlaire S Dillingham, DO;  Location: [facility];  Service: Plastics;  Laterality: Left;  . BREAST SURGERY Right 1990   "milk duct taken out"  . CHONDROPLASTY Right 02/19/2019   Procedure: CHONDROPLASTY; EXCISION EXOSTOSIS;  Surgeon: Valeria BatmanWhitfield, Peter W, MD;  Location: [facility];  Service: Orthopedics;  Laterality: Right;  . DEBRIDEMENT AND CLOSURE WOUND Left 07/01/2015   Procedure: LEFT ELBOW EXCISION OF WOUND WITH PRIMARY CLOSURE 2X5 CM ;  Surgeon: Peggye Formlaire S Dillingham, DO;  Location: [facility];  Service: Plastics;  Laterality: Left;  . ELBOW SURGERY Left X 23  in [place] <06/2015   from a cat bite; all I&D  . I & D EXTREMITY Left 07/08/2015   Procedure: IRRIGATION AND DEBRIDEMENT EXTREMITY, DRAINAGE OF LEFT ARM WOUND, A-CELL PLACEMENT, WOUND VAC PLACEMENT;  Surgeon: Alena Bills Dillingham, DO;  Location: [facility];  Service: Plastics;  Laterality: Left;  . INCISION AND DRAINAGE OF WOUND Left 08/05/2015   Procedure: IRRIGATION AND DEBRIDEMENT LEFT ELBOW WOUND, PLACEMENT OF ACELL;  Surgeon: Peggye Form, DO;  Location: [facility];  Service: Plastics;  Laterality: Left;  . KNEE  ARTHROSCOPY WITH MEDIAL MENISECTOMY Right 02/19/2019   Procedure: RIGHT KNEE ARTHROSCOPY, DEBRIDEMENT, PARTIAL MEDIAL AND LATERAL MENISECTOMY;  Surgeon: Valeria Batman, MD;  Location: [facility];  Service: Orthopedics;  Laterality: Right;  . LAPAROSCOPIC CHOLECYSTECTOMY  1998  . SKIN GRAFT Left 2016   took from anterior thigh; placed at elbow  . TONSILLECTOMY  ~ 2000  . TOTAL ABDOMINAL HYSTERECTOMY  2003  . WRIST SURGERY Right 01/2016     OB History   No obstetric history on file.     Family History  Problem Relation Age of Onset  . Liver disease Mother   . Dementia Mother   . Cirrhosis Mother   . Prostate cancer Father   . Colon cancer Maternal Grandmother   . Ovarian cancer Maternal Aunt     Social History   Tobacco Use  . Smoking status: Never Smoker  . Smokeless tobacco: Never Used  Substance Use Topics  . Alcohol use: Never    Alcohol/week: 0.0 standard drinks    Comment: social  . Drug use: No    Home Medications Prior to Admission medications   Medication Sig Start Date End Date Taking? Authorizing Provider  butalbital-acetaminophen-caffeine (FIORICET) 50-325-40 MG tablet TAKE 1 TABLET BY MOUTH EVERY 6 HOURS AS NEEDED FOR HEADACHE 05/22/19   Everlena Cooper, Adam R, DO  chlorpheniramine-HYDROcodone (TUSSIONEX PENNKINETIC ER) 10-8 MG/5ML SUER Take 5 mLs by mouth at bedtime for 5 days. 09/01/19 09/06/19  Eryn Krejci S, PA-C  diazepam (VALIUM) 10 MG tablet Take 1 tablet (10 mg) twice a day for one week then, IF NEEDED, increase to 1 tablet (10 mg) in the morning and 2 tablets (20 mg) at bedtime 06/14/19   Plovsky, Earvin Hansen, MD  diclofenac Sodium (VOLTAREN) 1 % GEL Apply topically as needed (knee pain).    [provider]  fluticasone (FLOVENT HFA) 44 MCG/ACT inhaler Inhale 2 puffs into the lungs 2 (two) times daily. 08/29/19   Terressa Koyanagi, DO  gabapentin (NEURONTIN) 300 MG capsule Take 2 capsules (600 mg total) by mouth 3 (three) times daily. 07/18/19  08/17/19  Henderson Cloud, MD  ipratropium (ATROVENT) 0.06 % nasal spray Place 2 sprays into both nostrils 4 (four) times daily. 08/31/19   Cathie Hoops, Amy V, PA-C  sodium chloride (OCEAN) 0.65 % SOLN nasal spray Place 1 spray into both nostrils as needed for congestion. 08/05/19   Michela Pitcher A, PA-C  venlafaxine XR (EFFEXOR-XR) 75 MG 24 hr capsule 1  qam 06/14/19   Plovsky, Earvin Hansen, MD    Allergies    Zofran Frazier Richards hcl], Droperidol, Flexeril [cyclobenzaprine], Morphine and related, Tessalon [benzonatate], and Voltaren [diclofenac sodium]  Review of Systems   Review of Systems  Constitutional: Negative for fever.  HENT: Negative for ear pain and sore throat.   Eyes: Negative for visual disturbance.  Respiratory: Negative for cough and shortness of breath.   Cardiovascular: Negative for chest pain and leg swelling.  Gastrointestinal: Negative for abdominal pain and vomiting.  Genitourinary: Negative for dysuria and hematuria.  Musculoskeletal: Negative for back pain.  Skin: Negative for color change and rash.  Neurological: Negative for headaches.  All other systems reviewed and are negative.   Physical Exam Updated Vital Signs BP 111/72 (BP Location: Left Arm)   Pulse (!) 114   Temp 98.9 F (37.2 C) (Oral)   Resp 18   Ht 5\' 4"  (1.626 m)   Wt 78.5 kg   SpO2 100%   BMI 29.70 kg/m   Physical Exam Vitals and nursing note reviewed.  Constitutional:      General: She is not in acute distress.    Appearance: She is well-developed.  HENT:     Head: Normocephalic and atraumatic.  Eyes:     Conjunctiva/sclera: Conjunctivae normal.  Cardiovascular:     Rate and Rhythm: Normal rate and regular rhythm.     Heart sounds: Normal heart sounds. No murmur.     Comments: HR 80s Pulmonary:     Effort: Pulmonary effort is normal. No respiratory distress.     Breath sounds: Normal breath sounds. No wheezing, rhonchi or rales.  Abdominal:     General: Bowel sounds are normal.      Palpations: Abdomen is soft.     Tenderness: There is no abdominal tenderness.  Musculoskeletal:        General: No tenderness.     Cervical back: Neck supple.     Right lower leg: No edema.     Left lower leg: No edema.  Skin:    General: Skin is warm and dry.  Neurological:     Mental Status: She is alert.     ED Results / Procedures / Treatments   Labs (all labs ordered are listed, but only abnormal results are displayed) Labs Reviewed - No data to display  EKG None  Radiology No results found.  Procedures Procedures (including critical care time)  Medications Ordered in ED Medications - No data to display  ED Course  I have reviewed the triage vital signs and the nursing notes.  Pertinent labs & imaging results that were available during my care of the patient were reviewed by me and considered in my medical decision making (see chart for details).    MDM Rules/Calculators/A&P                      53 year old female presenting for evaluation of persistent cough.  She was diagnosed with Covid in November and then was diagnosed with bronchitis about a week ago.  She has been trying inhalers, a humidifier in her room and multiple over-the-counter medications but she has had a persistent cough.  During the day it does not affect her too much but at night her cough is very severe and it is causing her not to be able to sleep.  She denies any fevers.  Denies chest pain, shortness of breath, pleuritic pain.  No bilateral lower extremity swelling, calf pain.  No hemoptysis.  No recent surgeries, mission to the hospital, periods of immobility, history of DVT/PE or cancer.  She states she has similar symptoms every year and the symptoms today are consistent with prior she is just hoping to get a prescription for some cough medication so that she can have some relief of her symptoms get some sleep  Initially she was tachycardic however pt states she was coughing when initial VS  were taken. HR 80-90 during my  entire evaluation. Lungs CTAB. Heart with RRR. No calf swelling/tenderness.   I discussed the possibility of PE especially given her recent diagnosis of the coronavirus.  I discussed that clinically, her symptoms do not appear consistent with PE at this time however she really needs to monitor her symptoms closely and if she has any chest pain, pleuritic pain, shortness of breath or persistent tachycardia she needs to return immediately for further work-up.  I will give her a prescription for Tussionex as this is helped her in the past.  I did review her in the database and we discussed her other medications that she is currently on.  She states she is no longer taking tramadol because she got a steroid injection in her knee and she is no longer requiring it.  I advised that if she does take Tussionex at night she needs to decrease dose of her other sedating medications or not take them at all.  Discussed that she will need to follow-up closely with PCP and return to ED for new or worsening symptoms.  She voiced understanding the plan was to return.  All questions answered.  Patient stable for discharge.  Final Clinical Impression(s) / ED Diagnoses Final diagnoses:  Viral URI with cough    Rx / DC Orders ED Discharge Orders         Ordered    chlorpheniramine-HYDROcodone (TUSSIONEX PENNKINETIC ER) 10-8 MG/5ML SUER  Daily at bedtime     09/01/19 [address] Nate Perri S, PA-C 09/01/19 1439    Veryl Speak, MD 09/04/19 1504

## 2019-09-04 ENCOUNTER — Other Ambulatory Visit: Payer: Self-pay

## 2019-09-04 ENCOUNTER — Telehealth (INDEPENDENT_AMBULATORY_CARE_PROVIDER_SITE_OTHER): Payer: Self-pay | Admitting: Family Medicine

## 2019-09-04 DIAGNOSIS — R05 Cough: Secondary | ICD-10-CM

## 2019-09-04 DIAGNOSIS — R059 Cough, unspecified: Secondary | ICD-10-CM

## 2019-09-04 MED ORDER — DOXYCYCLINE HYCLATE 100 MG PO CAPS: 100.0000 mg | ORAL_CAPSULE | Freq: Two times a day (BID) | ORAL | 0 refills | Status: DC

## 2019-09-04 NOTE — Progress Notes (Signed)
This visit type was conducted due to national recommendations for restrictions regarding the COVID-19 pandemic in an effort to limit this patient's exposure and mitigate transmission in our community.   Virtual Visit via Video Note  I connected with Sabrina [name] on 09/04/19 at  2:00 PM EST by a video enabled telemedicine application and verified that I am speaking with the correct person using two identifiers.  Location patient: home Location provider:work or home office Persons participating in the virtual visit: patient, provider  I discussed the limitations of evaluation and management by telemedicine and the availability of in person appointments. The patient expressed understanding and agreed to proceed.   HPI:  Sabrina [name] states she has had cough ever since she was diagnosed with Covid back in November.  Her cough is starting to "break up ".  She has not had any fever or chills.  She denies any active GERD symptoms.  No postnasal drip symptoms.  She has tried over-the-counter medication such as Robitussin and Delsym without relief.  She has had previous intolerance with Tessalon.  She also tried ipratropium nasal spray without relief.  She has tried basic remedies such as honey, lemon, and tea.  She has been to the ER several times.  Chest x-rays have been unremarkable.  She was prescribed limited Tussionex which has helped at night with her cough.  She is a non-smoker.  ROS: See pertinent positives and negatives per HPI.  Past Medical History:  Diagnosis Date  . Arthritis    "knees" (09/06/2017)  . Bullous pemphigus   . Cat bite 06/2014   to left elbow  . History of blood transfusion 1988   "when I had my baby"  . Muscle weakness of lower extremity 2001; 09/05/2017   "resolved after a couple weeks; ?" (09/06/2017)  . 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 Poisoning, snake bite 04/08/2016   "[place]; RUE"  . PONV (postoperative nausea and vomiting)   . S/P right knee surgery   .  Situational anxiety   . Staph infection ~ 2015   "left elbow and finger"    Past Surgical History:  Procedure Laterality Date  . APPENDECTOMY  ~ 1987  . APPLICATION OF A-CELL OF EXTREMITY Left 08/05/2015   Procedure: APPLICATION OF A-CELL OF EXTREMITY;  Surgeon: Peggye Form, DO;  Location: [facility];  Service: Plastics;  Laterality: Left;  . BREAST SURGERY Right 1990   "milk duct taken out"  . CHONDROPLASTY Right 02/19/2019   Procedure: CHONDROPLASTY; EXCISION EXOSTOSIS;  Surgeon: Valeria Batman, MD;  Location: [facility];  Service: Orthopedics;  Laterality: Right;  . DEBRIDEMENT AND CLOSURE WOUND Left 07/01/2015   Procedure: LEFT ELBOW EXCISION OF WOUND WITH PRIMARY CLOSURE 2X5 CM ;  Surgeon: Peggye Form, DO;  Location: [facility];  Service: Plastics;  Laterality: Left;  . ELBOW SURGERY Left X 23 in [place] <06/2015   from a cat bite; all I&D  . I & D EXTREMITY Left 07/08/2015   Procedure: IRRIGATION AND DEBRIDEMENT EXTREMITY, DRAINAGE OF LEFT ARM WOUND, A-CELL PLACEMENT, WOUND VAC PLACEMENT;  Surgeon: Alena Bills Dillingham, DO;  Location: [facility];  Service: Plastics;  Laterality: Left;  . INCISION AND DRAINAGE OF WOUND Left 08/05/2015   Procedure: IRRIGATION AND DEBRIDEMENT LEFT ELBOW WOUND, PLACEMENT OF ACELL;  Surgeon: Peggye Form, DO;  Location: [facility];  Service: Plastics;  Laterality: Left;  . KNEE ARTHROSCOPY WITH MEDIAL MENISECTOMY Right 02/19/2019   Procedure:  RIGHT KNEE ARTHROSCOPY, DEBRIDEMENT, PARTIAL MEDIAL AND LATERAL MENISECTOMY;  Surgeon: Garald Balding, MD;  Location: [facility];  Service: Orthopedics;  Laterality: Right;  . LAPAROSCOPIC CHOLECYSTECTOMY  1998  . SKIN GRAFT Left 2016   took from anterior thigh; placed at elbow  . TONSILLECTOMY  ~ 2000  . TOTAL ABDOMINAL HYSTERECTOMY  2003  . WRIST SURGERY Right 01/2016    Family History  Problem Relation Age of  Onset  . Liver disease Mother   . Dementia Mother   . Cirrhosis Mother   . Prostate cancer Father   . Colon cancer Maternal Grandmother   . Ovarian cancer Maternal Aunt     SOCIAL HX: Non-smoker   Current Outpatient Medications:  .  butalbital-acetaminophen-caffeine (FIORICET) 50-325-40 MG tablet, TAKE 1 TABLET BY MOUTH EVERY 6 HOURS AS NEEDED FOR HEADACHE, Disp: 10 tablet, Rfl: 3 .  chlorpheniramine-HYDROcodone (TUSSIONEX PENNKINETIC ER) 10-8 MG/5ML SUER, Take 5 mLs by mouth at bedtime for 5 days., Disp: 25 mL, Rfl: 0 .  diazepam (VALIUM) 10 MG tablet, Take 1 tablet (10 mg) twice a day for one week then, IF NEEDED, increase to 1 tablet (10 mg) in the morning and 2 tablets (20 mg) at bedtime, Disp: 90 tablet, Rfl: 4 .  diclofenac Sodium (VOLTAREN) 1 % GEL, Apply topically as needed (knee pain)., Disp: , Rfl:  .  doxycycline (VIBRAMYCIN) 100 MG capsule, Take 1 capsule (100 mg total) by mouth 2 (two) times daily., Disp: 14 capsule, Rfl: 0 .  fluticasone (FLOVENT HFA) 44 MCG/ACT inhaler, Inhale 2 puffs into the lungs 2 (two) times daily., Disp: 1 Inhaler, Rfl: 0 .  gabapentin (NEURONTIN) 300 MG capsule, Take 2 capsules (600 mg total) by mouth 3 (three) times daily., Disp: 180 capsule, Rfl: 2 .  ipratropium (ATROVENT) 0.06 % nasal spray, Place 2 sprays into both nostrils 4 (four) times daily., Disp: 15 mL, Rfl: 0 .  sodium chloride (OCEAN) 0.65 % SOLN nasal spray, Place 1 spray into both nostrils as needed for congestion., Disp: 60 mL, Rfl: 0 .  venlafaxine XR (EFFEXOR-XR) 75 MG 24 hr capsule, 1  qam, Disp: 90 capsule, Rfl: 1  EXAM:  VITALS per patient if applicable:  GENERAL: alert, oriented, appears well and in no acute distress  HEENT: atraumatic, conjunttiva clear, no obvious abnormalities on inspection of external nose and ears  NECK: normal movements of the head and neck  LUNGS: on inspection no signs of respiratory distress, breathing rate appears normal, no obvious gross SOB,  gasping or wheezing  CV: no obvious cyanosis  MS: moves all visible extremities without noticeable abnormality  PSYCH/NEURO: pleasant and cooperative, no obvious depression or anxiety, speech and thought processing grossly intact  ASSESSMENT AND PLAN:  Discussed the following assessment and plan:  Persistent cough several weeks out from Covid infection in November.  She does not describe any fever, dyspnea, or other red flags such as hemoptysis  -We recommended over-the-counter cough drops such as Ricola -Consider 7-day course of doxycycline 100 mg twice daily -We recommended against ongoing use of opioid cough suppressant -Touch base with primary if not improving by next week     I discussed the assessment and treatment plan with the patient. The patient was provided an opportunity to ask questions and all were answered. The patient agreed with the plan and demonstrated an understanding of the instructions.   The patient was advised to call back or seek an in-person evaluation if the symptoms worsen or if  the condition fails to improve as anticipated.     Evelena PeatBruce Ranay Ketter, MD

## 2019-09-09 ENCOUNTER — Encounter: Payer: Self-pay | Admitting: Neurology

## 2019-09-20 ENCOUNTER — Ambulatory Visit (INDEPENDENT_AMBULATORY_CARE_PROVIDER_SITE_OTHER): Payer: Self-pay | Admitting: Psychiatry

## 2019-09-20 ENCOUNTER — Other Ambulatory Visit: Payer: Self-pay

## 2019-09-20 DIAGNOSIS — F410 Panic disorder [episodic paroxysmal anxiety] without agoraphobia: Secondary | ICD-10-CM

## 2019-09-20 MED ORDER — DIAZEPAM 10 MG PO TABS: ORAL_TABLET | ORAL | 4 refills | Status: DC

## 2019-09-20 MED ORDER — VENLAFAXINE HCL ER 75 MG PO CP24: ORAL_CAPSULE | ORAL | 1 refills | Status: DC

## 2019-09-20 MED FILL — VENLAFAXINE HCL ER 75 MG CA: 75 | 90 days supply | Qty: 90 | Fill #0

## 2019-09-20 NOTE — Progress Notes (Signed)
BH MD/PA/NP OP Progress Note  09/20/2019 8:51 AM Sabrina [name]  MRN:  [id number]  Chief Complaint: Panic disorder without agoraphobia Today the patient is doing very well. Things that happened. Having a bad that was expected was that her father died in [place]. Also it was bad to happen was that she was positive for the virus really didn't have any symptoms from it. She did have a cough without eventually went away. Physically she feels really good. She does have from chronic knee pain. Apparently there issues in the future for her knee. 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 Patient is positive and optimistic. She says he gets her focus and purpose. Presently she lives in [place] plans to move [place]. She takes her medicines just as prescribed. She demonstrates no significant anxiety and denies daily depression. He drinks no alcohol uses no drugs is functioning very well.  HPI:   Past Psychiatric History: See intake H&P for full details. Reviewed, with no updates at this time.   Past Medical History:  Past Medical History:  Diagnosis Date  . Arthritis    "knees" (09/06/2017)  . Bullous pemphigus   . Cat bite 06/2014   to left elbow  . History of blood transfusion 1988   "when I had my baby"  . Muscle weakness of lower extremity 2001; 09/05/2017   "resolved after a couple weeks; ?" (09/06/2017)  . 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 Poisoning, snake bite 04/08/2016   "[place]; RUE"  . PONV (postoperative nausea and vomiting)   . S/P right knee surgery   . Situational anxiety   . Staph infection ~ 2015   "left elbow and finger"    Past Surgical History:  Procedure Laterality Date  . APPENDECTOMY  ~ 1987  . APPLICATION OF A-CELL OF EXTREMITY Left 08/05/2015   Procedure: APPLICATION OF A-CELL OF EXTREMITY;  Surgeon: Peggye Form,  DO;  Location: [facility];  Service: Plastics;  Laterality: Left;  . BREAST SURGERY Right 1990   "milk duct taken out"  . CHONDROPLASTY Right 02/19/2019   Procedure: CHONDROPLASTY; EXCISION EXOSTOSIS;  Surgeon: Valeria Batman, MD;  Location: [facility];  Service: Orthopedics;  Laterality: Right;  . DEBRIDEMENT AND CLOSURE WOUND Left 07/01/2015   Procedure: LEFT ELBOW EXCISION OF WOUND WITH PRIMARY CLOSURE 2X5 CM ;  Surgeon: Peggye Form, DO;  Location: [facility];  Service: Plastics;  Laterality: Left;  . ELBOW SURGERY Left X 23 in [place] <06/2015   from a cat bite; all I&D  . I & D EXTREMITY Left 07/08/2015   Procedure: IRRIGATION AND DEBRIDEMENT EXTREMITY, DRAINAGE OF LEFT ARM WOUND, A-CELL PLACEMENT, WOUND VAC PLACEMENT;  Surgeon: Alena Bills Dillingham, DO;  Location: [facility];  Service: Plastics;  Laterality: Left;  . INCISION AND DRAINAGE OF WOUND Left 08/05/2015   Procedure: IRRIGATION AND DEBRIDEMENT LEFT ELBOW WOUND, PLACEMENT OF ACELL;  Surgeon: Peggye Form, DO;  Location: [facility];  Service: Plastics;  Laterality: Left;  . KNEE ARTHROSCOPY WITH MEDIAL MENISECTOMY Right 02/19/2019   Procedure: RIGHT KNEE ARTHROSCOPY, DEBRIDEMENT, PARTIAL MEDIAL AND LATERAL MENISECTOMY;  Surgeon: Valeria Batman, MD;  Location: [facility];  Service: Orthopedics;  Laterality: Right;  . LAPAROSCOPIC CHOLECYSTECTOMY  1998  . SKIN GRAFT Left 2016  took from anterior thigh; placed at elbow  . TONSILLECTOMY  ~ 2000  . TOTAL ABDOMINAL HYSTERECTOMY  2003  . WRIST SURGERY Right 01/2016    Family Psychiatric History: See intake H&P for full details. Reviewed, with no updates at this time.   Family History:  Family History  Problem Relation Age of Onset  . Liver disease Mother   . Dementia Mother   . Cirrhosis Mother   . Prostate cancer Father   . Colon cancer Maternal Grandmother   . Ovarian cancer Maternal  Aunt     Social History:  Social History   Socioeconomic History  . Marital status: Divorced    Spouse name: Not on file  . Number of children: 1  . Years of education: Not on file  . Highest education level: Associate degree: academic program  Occupational History  . Occupation: unemployed  Tobacco Use  . Smoking status: Never Smoker  . Smokeless tobacco: Never Used  Substance and Sexual Activity  . Alcohol use: Never    Alcohol/week: 0.0 standard drinks    Comment: social  . Drug use: No  . Sexual activity: Not Currently  Other Topics Concern  . Not on file  Social History Narrative   Pateint is right-handed. She lives alone in a single level home. She rarely drinks caffeine. She is limited to exercise due to knee injuries.   Social Determinants of Health   Financial Resource Strain:   . Difficulty of Paying Living Expenses: Not on file  Food Insecurity:   . Worried About Charity fundraiser in the Last Year: Not on file  . Ran Out of Food in the Last Year: Not on file  Transportation Needs:   . Lack of Transportation (Medical): Not on file  . Lack of Transportation (Non-Medical): Not on file  Physical Activity:   . Days of Exercise per Week: Not on file  . Minutes of Exercise per Session: Not on file  Stress:   . Feeling of Stress : Not on file  Social Connections:   . Frequency of Communication with Friends and Family: Not on file  . Frequency of Social Gatherings with Friends and Family: Not on file  . Attends Religious Services: Not on file  . Active Member of Clubs or Organizations: Not on file  . Attends Archivist Meetings: Not on file  . Marital Status: Not on file    Allergies:  Allergies  Allergen Reactions  . Zofran [Ondansetron Hcl] Hives and Other (See Comments)    Spots around iv site  . Droperidol Palpitations  . Flexeril [Cyclobenzaprine] Anxiety  . Morphine And Related Itching  . Tessalon [Benzonatate] Other (See Comments)     Watery eyes  . Voltaren [Diclofenac Sodium] Rash    Metabolic Disorder Labs: Lab Results  Component Value Date   HGBA1C 5.2 06/18/2018   MPG  07/05/2015    QUESTIONABLE IDENTIFICATION / INCORRECTLY LABELED SPECIMEN   No results found for: PROLACTIN Lab Results  Component Value Date   CHOL 209 (H) 06/18/2018   TRIG 71 06/18/2018   HDL 73 06/18/2018   CHOLHDL 2.9 06/18/2018   VLDL 29 04/27/2015   LDLCALC 122 (H) 06/18/2018   LDLCALC 138 (H) 04/27/2015   Lab Results  Component Value Date   TSH 2.890 06/18/2018   TSH  07/05/2015    QUESTIONABLE IDENTIFICATION / INCORRECTLY LABELED SPECIMEN    Therapeutic Level Labs: No results found for: LITHIUM No results found for: VALPROATE No  components found for:  CBMZ  Current Medications: Current Outpatient Medications  Medication Sig Dispense Refill  . butalbital-acetaminophen-caffeine (FIORICET) 50-325-40 MG tablet TAKE 1 TABLET BY MOUTH EVERY 6 HOURS AS NEEDED FOR HEADACHE 10 tablet 3  . diazepam (VALIUM) 10 MG tablet Take 1 tablet (10 mg) twice a day for one week then, IF NEEDED, increase to 1 tablet (10 mg) in the morning and 2 tablets (20 mg) at bedtime 90 tablet 4  . diclofenac Sodium (VOLTAREN) 1 % GEL Apply topically as needed (knee pain).    Marland Kitchen doxycycline (VIBRAMYCIN) 100 MG capsule Take 1 capsule (100 mg total) by mouth 2 (two) times daily. 14 capsule 0  . fluticasone (FLOVENT HFA) 44 MCG/ACT inhaler Inhale 2 puffs into the lungs 2 (two) times daily. 1 Inhaler 0  . gabapentin (NEURONTIN) 300 MG capsule Take 2 capsules (600 mg total) by mouth 3 (three) times daily. 180 capsule 2  . ipratropium (ATROVENT) 0.06 % nasal spray Place 2 sprays into both nostrils 4 (four) times daily. 15 mL 0  . sodium chloride (OCEAN) 0.65 % SOLN nasal spray Place 1 spray into both nostrils as needed for congestion. 60 mL 0  . venlafaxine XR (EFFEXOR-XR) 75 MG 24 hr capsule 1  qam 90 capsule 1   No current facility-administered medications for  this visit.     Musculoskeletal: Strength & Muscle Tone: within normal limits Gait & Station: normal Patient leans: N/A  Psychiatric Specialty Exam: ROS  There were no vitals taken for this visit.There is no height or weight on file to calculate BMI.  General Appearance: Casual and Well Groomed  Eye Contact:  Good  Speech:  Clear and Coherent  Volume:  Normal  Mood:  Euthymic  Affect:  Congruent  Thought Process:  Goal Directed and Descriptions of Associations: Intact  Orientation:  Full (Time, Place, and Person)  Thought Content: Logical   Suicidal Thoughts:  No  Homicidal Thoughts:  No  Memory:  Immediate;   Fair  Judgement:  Fair  Insight:  Fair  Psychomotor Activity:  Normal  Concentration:  Concentration: Good  Recall:  Good  Fund of Knowledge: Good  Language: Good  Akathisia:  Negative  Handed:  Right  AIMS (if indicated): not done  Assets:  Communication Skills Desire for Improvement Housing Transportation  ADL's:  Intact  Cognition: WNL  Sleep:  Fair   Screenings: PHQ2-9     Office Visit from 09/21/2018 in [facility] Office Visit from 06/25/2018 in [facility] Office Visit from 06/18/2018 in [facility] Office Visit from 08/28/2017 in [facility] Office Visit from 05/22/2017 in [facility]  PHQ-2 Total Score  1  0  0  0  0       Assessment and Plan:   Today the patient is doing well. Her first problems and panic disorder. Patient takes Valium 10 mg 3 a day does very well. She is not oversedated. Her second problem is that of major depression. She takes 75 mg of Effexor and was very well. At this time the patient denies chest pain shortness of breath or any neurological symptoms. She is functioning extremely well. She will return to see me in 3 months.  Status of current problems: gradually improving  Labs Ordered: No orders of the defined types were placed  in this encounter.   Labs Reviewed: n/a  Collateral Obtained/Records Reviewed: n/a  Plan:  Continue Lexapro 20 mg daily Increase Seroquel to 200 mg nightly Return to clinic in 3-4 months, transfer care to Dr. Liz Beach, MD 09/20/2019, 8:51 AM

## 2019-09-24 MED FILL — DIAZEPAM 10 MG TABS: 10 | 30 days supply | Qty: 90 | Fill #3

## 2019-09-24 MED FILL — GABAPENTIN 300 MG CAPSULE: 300 | 30 days supply | Qty: 180 | Fill #2

## 2019-09-25 NOTE — Progress Notes (Deleted)
Virtual Visit via Video Note The purpose of this virtual visit is to provide medical care while limiting exposure to the novel coronavirus.    Consent was obtained for video visit:  Yes.   Answered questions that patient had about telehealth interaction:  Yes.   I discussed the limitations, risks, security and privacy concerns of performing an evaluation and management service by telemedicine. I also discussed with the patient that there may be a patient responsible charge related to this service. The patient expressed understanding and agreed to proceed.  Pt location: Home Physician Location: office Name of referring provider:  Philip Aspen, Almira Bar* I connected with Sabrina [name] at patients initiation/request on 09/27/2019 at  9:50 AM EST by video enabled telemedicine application and verified that I am speaking with the correct person using two identifiers. Pt MRN:  [id number] Pt DOB:  05/19/1966 Video Participants:  Sabrina [name]   History of Present Illness:  Sabrina [name] is a 54 year old Caucasian woman with bullous pemphigoid and anxiety with panic disorder who follows up for migraines and tension-type headaches.  UPDATE: With Dr. Caprice Renshaw approval, we increased venlafaxine in September to try and achieve better migraine control.  ***  Intensity:  8/10 Duration:  1 day Frequency:  2 days a week Frequency of abortive medication: 2 days a week Current NSAIDS:none Current analgesics:Fioricet Current triptans:none Current ergotamine:none Current anti-emetic:promethazine 25mg  Current muscle relaxants:none Current anti-anxiolytic:Valium Current sleep aide:none Current Antihypertensive medications:none Current Antidepressant medications:Venlafaxine XR75mg  daily Current Anticonvulsant medications:Gabapentin 600mg  three times daily Current anti-CGRP:none Current Vitamins/Herbal/Supplements:D Current Antihistamines/Decongestants:none Other  therapy:none Hormone/birth control:none  Caffeine:No coffee or colas Diet:Hydrates with water. Smooties Exercise:Tries to walk but has trouble with her knees. Likes to ride her horse but first needs knee surgery Depression/Anxiety:Significant anxiety with panic attacks. Increased anxiety as father recently had a stroke in and she was unable to visit him due to COVID-19. She also has been struggling Stress related to increased pain due to thebullous pemphigoid Other pain:pain due to thebullous pemphigoid. Bilateral knee pain Sleep hygiene:poor  HISTORY: Onset:  Her late-20s Location:Over left eyebrow Quality:Stabbing, pounding, pressure Initial intensity:8-9/10.Shedenies new headache, thunderclap headache Aura:No Premonitory Phase:No Postdrome:no Associated symptoms:Nausea, vomiting.Shedenies associated photophobia, phonophobia,unilateral numbness or weakness. Initial Duration:Usually 1 day. Longest lasted 3 days. Initial Frequency:3 to 4 days a month but more frequently lately due to increased stress. Initial Frequency of abortive medication:Fioricet 1-2 days a week. Excedrin most days a week (for tension-type headache) Triggers:  Emotional stress Relieving factors:Laying down Activity:Aggravated when sitting up or standing upright She also has tension type headaches which are mild to moderate bifrontal pressure, almost daily  Past NSAIDS:naproxen Past analgesics:tramadol, Excedrin, Goody powder, Tylenol Past abortive triptans:rizatriptan 10mg  Past abortive ergotamine:none Past muscle relaxants:none Past anti-emetic:Compazine 10mg , Phenergan, Zofran (allergy) Past antihypertensive medications:none Past antidepressant medications:Lexapro, Paxil, Zoloft Past anticonvulsant medications:Topiramate 25mg  twice daily(on for one month) Past anti-CGRP:none Past  vitamins/Herbal/Supplements:none Past antihistamines/decongestants:none Other past therapies:none  Family history of headache:Father (migraines)  Past Medical History: Past Medical History:  Diagnosis Date  . Arthritis    "knees" (09/06/2017)  . Bullous pemphigus   . Cat bite 06/2014   to left elbow  . History of blood transfusion 1988   "when I had my baby"  . Muscle weakness of lower extremity 2001; 09/05/2017   "resolved after a couple weeks; ?" (09/06/2017)  . 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 Poisoning, snake bite 04/08/2016   "[place]; RUE"  . PONV (postoperative nausea and vomiting)   .  S/P right knee surgery   . Situational anxiety   . Staph infection ~ 2015   "left elbow and finger"    Medications: Outpatient Encounter Medications as of 09/27/2019  Medication Sig  . butalbital-acetaminophen-caffeine (FIORICET) 50-325-40 MG tablet TAKE 1 TABLET BY MOUTH EVERY 6 HOURS AS NEEDED FOR HEADACHE  . diazepam (VALIUM) 10 MG tablet Take 1 tablet (10 mg) twice a day for one week then, IF NEEDED, increase to 1 tablet (10 mg) in the morning and 2 tablets (20 mg) at bedtime  . diclofenac Sodium (VOLTAREN) 1 % GEL Apply topically as needed (knee pain).  Marland Kitchen doxycycline (VIBRAMYCIN) 100 MG capsule Take 1 capsule (100 mg total) by mouth 2 (two) times daily.  . fluticasone (FLOVENT HFA) 44 MCG/ACT inhaler Inhale 2 puffs into the lungs 2 (two) times daily.  Marland Kitchen gabapentin (NEURONTIN) 300 MG capsule Take 2 capsules (600 mg total) by mouth 3 (three) times daily.  Marland Kitchen ipratropium (ATROVENT) 0.06 % nasal spray Place 2 sprays into both nostrils 4 (four) times daily.  . sodium chloride (OCEAN) 0.65 % SOLN nasal spray Place 1 spray into both nostrils as needed for congestion.  Marland Kitchen venlafaxine XR (EFFEXOR-XR) 75 MG 24 hr capsule 1  qam   No facility-administered encounter medications on file as of 09/27/2019.    Allergies: Allergies  Allergen Reactions  . Zofran [Ondansetron Hcl] Hives  and Other (See Comments)    Spots around iv site  . Droperidol Palpitations  . Flexeril [Cyclobenzaprine] Anxiety  . Morphine And Related Itching  . Tessalon [Benzonatate] Other (See Comments)    Watery eyes  . Voltaren [Diclofenac Sodium] Rash    Family History: Family History  Problem Relation Age of Onset  . Liver disease Mother   . Dementia Mother   . Cirrhosis Mother   . Prostate cancer Father   . Colon cancer Maternal Grandmother   . Ovarian cancer Maternal Aunt     Social History: Social History   Socioeconomic History  . Marital status: Divorced    Spouse name: Not on file  . Number of children: 1  . Years of education: Not on file  . Highest education level: Associate degree: academic program  Occupational History  . Occupation: unemployed  Tobacco Use  . Smoking status: Never Smoker  . Smokeless tobacco: Never Used  Substance and Sexual Activity  . Alcohol use: Never    Alcohol/week: 0.0 standard drinks    Comment: social  . Drug use: No  . Sexual activity: Not Currently  Other Topics Concern  . Not on file  Social History Narrative   Pateint is right-handed. She lives alone in a single level home. She rarely drinks caffeine. She is limited to exercise due to knee injuries.   Social Determinants of Health   Financial Resource Strain:   . Difficulty of Paying Living Expenses: Not on file  Food Insecurity:   . Worried About Programme researcher, broadcasting/film/video in the Last Year: Not on file  . Ran Out of Food in the Last Year: Not on file  Transportation Needs:   . Lack of Transportation (Medical): Not on file  . Lack of Transportation (Non-Medical): Not on file  Physical Activity:   . Days of Exercise per Week: Not on file  . Minutes of Exercise per Session: Not on file  Stress:   . Feeling of Stress : Not on file  Social Connections:   . Frequency of Communication with Friends and Family: Not on file  .  Frequency of Social Gatherings with Friends and Family: Not  on file  . Attends Religious Services: Not on file  . Active Member of Clubs or Organizations: Not on file  . Attends Archivist Meetings: Not on file  . Marital Status: Not on file  Intimate Partner Violence:   . Fear of Current or Ex-Partner: Not on file  . Emotionally Abused: Not on file  . Physically Abused: Not on file  . Sexually Abused: Not on file    Observations/Objective:   *** No acute distress.  Alert and oriented.  Speech fluent and not dysarthric.  Language intact.  Eyes orthophoric on primary gaze.  Face symmetric.  Assessment and Plan:   1.  Migraine without aura, without status migrainosus, not intractable 2.  Episodic tension-type headache, not intractable  1.  For preventative management, *** 2.  For abortive therapy, *** 3.  Limit use of pain relievers to no more than 2 days out of week to prevent risk of rebound or medication-overuse headache. 4.  Keep headache diary 5.  Exercise, hydration, caffeine cessation, sleep hygiene, monitor for and avoid triggers 6.  Follow up ***   Follow Up Instructions:    -I discussed the assessment and treatment plan with the patient. The patient was provided an opportunity to ask questions and all were answered. The patient agreed with the plan and demonstrated an understanding of the instructions.   The patient was advised to call back or seek an in-person evaluation if the symptoms worsen or if the condition fails to improve as anticipated.    Total Time spent in visit with the patient was:  ***, of which more than 50% of the time was spent in counseling and/or coordinating care on ***.   Pt understands and agrees with the plan of care outlined.     Dudley Major, DO

## 2019-09-26 ENCOUNTER — Telehealth: Payer: Self-pay | Admitting: Neurology

## 2019-09-26 ENCOUNTER — Telehealth: Payer: Self-pay | Admitting: Orthopaedic Surgery

## 2019-09-26 ENCOUNTER — Other Ambulatory Visit: Payer: Self-pay | Admitting: Neurology

## 2019-09-26 MED FILL — BUTALBITAL-APAP-CAFFEINE 50: 50-325-40 | 3 days supply | Qty: 10 | Fill #0

## 2019-09-26 NOTE — Telephone Encounter (Signed)
Patient called advised her knee is swollen and it hurt to put work on it. Patient asked what can she do to help her knee feel better. Patient said she has tried everything she was told to do and nothing is helping. Patient said the swelling does go down when she ices and elevate it. Patient said she is on her feet from 10 to 12 hours. Patient asked if she can be called back today because when she goes back to work she will not be able to answer her phone. The number to contact patient is [phone]

## 2019-09-26 NOTE — Telephone Encounter (Signed)
Please advise 

## 2019-09-26 NOTE — Telephone Encounter (Signed)
Has significant arthritis in that knee-will have recurrent swelling and pain. Wear support and try OTC meds, occasional cortisone injection. Might not tolerate multiple hours on her feet. Please call. I tries x2 this pm and left message

## 2019-09-26 NOTE — Telephone Encounter (Signed)
Patient is calling in needing a refill on her Fioricet medication sent to the pharm on file. She already contacted pharm and they said she didn't have any refills left. She is scheduled for an appt on 11/20/19. Thanks!

## 2019-09-27 ENCOUNTER — Telehealth: Payer: Self-pay | Admitting: Neurology

## 2019-09-30 ENCOUNTER — Encounter (HOSPITAL_COMMUNITY): Payer: Self-pay | Admitting: Emergency Medicine

## 2019-09-30 ENCOUNTER — Other Ambulatory Visit: Payer: Self-pay

## 2019-09-30 ENCOUNTER — Ambulatory Visit (HOSPITAL_COMMUNITY)
Admission: EM | Admit: 2019-09-30 | Discharge: 2019-09-30 | Disposition: A | Discharge status: Home / Self Care | Payer: Self-pay | Attending: Family Medicine | Admitting: Family Medicine

## 2019-09-30 ENCOUNTER — Ambulatory Visit (INDEPENDENT_AMBULATORY_CARE_PROVIDER_SITE_OTHER): Payer: Self-pay

## 2019-09-30 DIAGNOSIS — S60222A Contusion of left hand, initial encounter: Secondary | ICD-10-CM

## 2019-09-30 DIAGNOSIS — W548XXA Other contact with dog, initial encounter: Secondary | ICD-10-CM

## 2019-09-30 MED ORDER — TRAMADOL HCL 50 MG PO TABS: 50.0000 mg | ORAL_TABLET | Freq: Four times a day (QID) | ORAL | 0 refills | Status: DC | PRN

## 2019-09-30 NOTE — ED Triage Notes (Signed)
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 Dog pulled her and felt a pop in left hand. Significant swelling and bruising to hand. Happened at noon

## 2019-09-30 NOTE — ED Provider Notes (Signed)
[facility]    CSN: [id number] Arrival date & time: 09/30/19  1405      History   Chief Complaint Chief Complaint  Patient presents with  . Hand Injury    HPI Sabrina [name] is a 54 y.o. female.   HPI  Patient had a leash wrapped around her left hand.  She was restraining a large dog.  The dog pulled and she felt a pop and pain in her hand.  Is now swollen, discolored, and very painful.  Here for the hand complaint with no other issues noted  Past Medical History:  Diagnosis Date  . Arthritis    "knees" (09/06/2017)  . Bullous pemphigus   . Cat bite 06/2014   to left elbow  . History of blood transfusion 1988   "when I had my baby"  . Muscle weakness of lower extremity 2001; 09/05/2017   "resolved after a couple weeks; ?" (09/06/2017)  . 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 Poisoning, snake bite 04/08/2016   "[place]; RUE"  . PONV (postoperative nausea and vomiting)   . S/P right knee surgery   . Situational anxiety   . Staph infection ~ 2015   "left elbow and finger"    Patient Active Problem List   Diagnosis Date Noted  . Primary osteoarthritis of right knee 08/06/2019  . 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 Other meniscus derangements, posterior horn of medial meniscus, left knee 02/19/2019  . Bucket handle tear of lateral meniscus 02/19/2019  . Unilateral primary osteoarthritis, right knee 12/13/2018  . Panic disorder 12/13/2018  . Chronic headaches 12/13/2018  . Weakness 09/06/2017  . Depression 09/06/2017  . 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  . Generalized anxiety disorder with panic attacks 09/06/2017  . Right hand pain   . Wrist swelling   . Cellulitis of right upper extremity 04/11/2016  . Elbow pain 07/20/2015  . Anxiety 07/20/2015  . Tachycardia 07/04/2015  . 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HCC)   . Skin ulcer of upper arm, limited to breakdown of skin (HCC) 07/01/2015    Past Surgical History:  Procedure Laterality Date  . APPENDECTOMY  ~ 1987  .  APPLICATION OF A-CELL OF EXTREMITY Left 08/05/2015   Procedure: APPLICATION OF A-CELL OF EXTREMITY;  Surgeon: Peggye Form, DO;  Location: [facility];  Service: Plastics;  Laterality: Left;  . BREAST SURGERY Right 1990   "milk duct taken out"  . CHONDROPLASTY Right 02/19/2019   Procedure: CHONDROPLASTY; EXCISION EXOSTOSIS;  Surgeon: Valeria Batman, MD;  Location: [facility];  Service: Orthopedics;  Laterality: Right;  . DEBRIDEMENT AND CLOSURE WOUND Left 07/01/2015   Procedure: LEFT ELBOW EXCISION OF WOUND WITH PRIMARY CLOSURE 2X5 CM ;  Surgeon: Peggye Form, DO;  Location: [facility];  Service: Plastics;  Laterality: Left;  . ELBOW SURGERY Left X 23 in [place] <06/2015   from a cat bite; all I&D  . I & D EXTREMITY Left 07/08/2015   Procedure: IRRIGATION AND DEBRIDEMENT EXTREMITY, DRAINAGE OF LEFT ARM WOUND, A-CELL PLACEMENT, WOUND VAC PLACEMENT;  Surgeon: Alena Bills Dillingham, DO;  Location: [facility];  Service: Plastics;  Laterality: Left;  . INCISION AND DRAINAGE OF WOUND Left 08/05/2015   Procedure: IRRIGATION AND DEBRIDEMENT LEFT ELBOW WOUND, PLACEMENT OF ACELL;  Surgeon: Peggye Form, DO;  Location: [facility];  Service: Plastics;  Laterality: Left;  . KNEE ARTHROSCOPY WITH MEDIAL MENISECTOMY Right 02/19/2019   Procedure: RIGHT KNEE ARTHROSCOPY, DEBRIDEMENT, PARTIAL  MEDIAL AND LATERAL MENISECTOMY;  Surgeon: Garald Balding, MD;  Location: [facility];  Service: Orthopedics;  Laterality: Right;  . LAPAROSCOPIC CHOLECYSTECTOMY  1998  . SKIN GRAFT Left 2016   took from anterior thigh; placed at elbow  . TONSILLECTOMY  ~ 2000  . TOTAL ABDOMINAL HYSTERECTOMY  2003  . WRIST SURGERY Right 01/2016    OB History   No obstetric history on file.      Home Medications    Prior to Admission medications   Medication Sig Start Date End Date Taking? Authorizing Provider  gabapentin (NEURONTIN) 300 MG  capsule Take 2 capsules (600 mg total) by mouth 3 (three) times daily. 07/18/19 09/30/19 Yes Erline Hau, MD  venlafaxine XR (EFFEXOR-XR) 75 MG 24 hr capsule 1  qam 09/20/19  Yes Plovsky, Berneta Sages, MD  butalbital-acetaminophen-caffeine (FIORICET) 50-325-40 MG tablet TAKE 1 TABLET BY MOUTH EVERY 6 HOURS AS NEEDED FOR HEADACHE 09/26/19   Tomi Likens, Adam R, DO  diazepam (VALIUM) 10 MG tablet Take 1 tablet (10 mg) twice a day for one week then, IF NEEDED, increase to 1 tablet (10 mg) in the morning and 2 tablets (20 mg) at bedtime 09/20/19   Plovsky, Berneta Sages, MD  diclofenac Sodium (VOLTAREN) 1 % GEL Apply topically as needed (knee pain).    [provider]  doxycycline (VIBRAMYCIN) 100 MG capsule Take 1 capsule (100 mg total) by mouth 2 (two) times daily. 09/04/19   Burchette, Alinda Sierras, MD  fluticasone (FLOVENT HFA) 44 MCG/ACT inhaler Inhale 2 puffs into the lungs 2 (two) times daily. 08/29/19   Colin Benton R, DO  ipratropium (ATROVENT) 0.06 % nasal spray Place 2 sprays into both nostrils 4 (four) times daily. 08/31/19   [name], Amy V, PA-C  sodium chloride (OCEAN) 0.65 % SOLN nasal spray Place 1 spray into both nostrils as needed for congestion. 08/05/19   Renita Papa, PA-C    Family History Family History  Problem Relation Age of Onset  . Liver disease Mother   . Dementia Mother   . Cirrhosis Mother   . Prostate cancer Father   . Colon cancer Maternal Grandmother   . Ovarian cancer Maternal Aunt     Social History Social History   Tobacco Use  . Smoking status: Never Smoker  . Smokeless tobacco: Never Used  Substance Use Topics  . Alcohol use: Never    Alcohol/week: 0.0 standard drinks    Comment: social  . Drug use: No     Allergies   Zofran [ondansetron hcl], Droperidol, Flexeril [cyclobenzaprine], Morphine and related, Tessalon [benzonatate], and Voltaren [diclofenac sodium]   Review of Systems Review of Systems  Musculoskeletal: Positive for arthralgias and joint  swelling.       Hand pain, swelling, bruising  Skin: Positive for color change.     Physical Exam Triage Vital Signs ED Triage Vitals  Enc Vitals Group     BP 09/30/19 1603 101/74     Pulse Rate 09/30/19 1603 77     Resp 09/30/19 1603 16     Temp 09/30/19 1603 98.2 F (36.8 C)     Temp Source 09/30/19 1603 Oral     SpO2 09/30/19 1603 99 %     Weight --      Height --      Head Circumference --      Peak Flow --      Pain Score 09/30/19 1559 9     Pain Loc --  Pain Edu? --      Excl. in GC? --    No data found.  Updated Vital Signs BP 101/74   Pulse 77   Temp 98.2 F (36.8 C) (Oral)   Resp 16   SpO2 99%      Physical Exam Constitutional:      General: She is not in acute distress.    Appearance: She is well-developed.  HENT:     Head: Normocephalic and atraumatic.  Eyes:     Conjunctiva/sclera: Conjunctivae normal.     Pupils: Pupils are equal, round, and reactive to light.  Cardiovascular:     Rate and Rhythm: Normal rate.  Pulmonary:     Effort: Pulmonary effort is normal. No respiratory distress.  Abdominal:     General: There is no distension.     Palpations: Abdomen is soft.  Musculoskeletal:        General: Normal range of motion.     Cervical back: Normal range of motion.     Comments: Left hand has swelling and bruising around the index finger metacarpal and MCP joint.  Tenderness over the first MCP distally.  Normal sensory exam of the tips.  Good range of motion but weak grip secondary to pain.  Skin:    General: Skin is warm and dry.  Neurological:     Mental Status: She is alert. Mental status is at baseline.  Psychiatric:        Mood and Affect: Mood normal.        Behavior: Behavior normal.      UC Treatments / Results  Labs (all labs ordered are listed, but only abnormal results are displayed) Labs Reviewed - No data to display  EKG   Radiology DG Hand Complete Left  Result Date: 09/30/2019 CLINICAL DATA:  Pain. EXAM:  LEFT HAND - COMPLETE 3+ VIEW COMPARISON:  None. 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 No fractures. No other acute abnormalities. Soft tissue swelling. IMPRESSION: Significant soft tissue swelling.  No fractures. Electronically Signed   By: Gerome Sam III M.D   On: 09/30/2019 16:16    Procedures Procedures (including critical care time)  Medications Ordered in UC Medications - No data to display  Initial Impression / Assessment and Plan / UC Course  I have reviewed the triage vital signs and the nursing notes.  Pertinent labs & imaging results that were available during my care of the patient were reviewed by me and considered in my medical decision making (see chart for details).      *Hand contusion discussed Final Clinical Impressions(s) / UC Diagnoses   Final diagnoses:  None     Discharge Instructions     Ice, Ace for compression, elevation, all to reduce pain and swelling Take tramadol severe pain Expect improvement over time        ED Prescriptions    None     PDMP not reviewed this encounter.   Eustace Moore, MD 09/30/19 [phone]

## 2019-09-30 NOTE — Discharge Instructions (Addendum)
Ice, Ace for compression, elevation, all to reduce pain and swelling Take tramadol severe pain Expect improvement over time

## 2019-10-02 ENCOUNTER — Other Ambulatory Visit: Payer: Self-pay

## 2019-10-02 ENCOUNTER — Telehealth (INDEPENDENT_AMBULATORY_CARE_PROVIDER_SITE_OTHER): Payer: HRSA Program | Admitting: Internal Medicine

## 2019-10-02 ENCOUNTER — Ambulatory Visit: Payer: Self-pay | Admitting: Family Medicine

## 2019-10-02 ENCOUNTER — Telehealth: Payer: Self-pay | Admitting: Internal Medicine

## 2019-10-02 ENCOUNTER — Telehealth: Payer: Self-pay | Admitting: *Deleted

## 2019-10-02 ENCOUNTER — Encounter: Payer: Self-pay | Admitting: *Deleted

## 2019-10-02 DIAGNOSIS — R05 Cough: Secondary | ICD-10-CM | POA: Diagnosis not present

## 2019-10-02 DIAGNOSIS — R053 Chronic cough: Secondary | ICD-10-CM

## 2019-10-02 DIAGNOSIS — U071 COVID-19: Secondary | ICD-10-CM

## 2019-10-02 DIAGNOSIS — M25432 Effusion, left wrist: Secondary | ICD-10-CM

## 2019-10-02 MED ORDER — HYDROCOD POLST-CPM POLST ER 10-8 MG/5ML PO SUER: 5.0000 mL | Freq: Every evening | ORAL | 0 refills | Status: DC | PRN

## 2019-10-02 NOTE — Telephone Encounter (Signed)
Copied from CRM [id number]. Topic: General - Inquiry >> Oct 02, 2019  2:47 PM Sabrina [name] wrote: Reason for CRM:   Pt states she just had a virtual visit with Dr. Ardyth Harps and was told she was getting cough medication, but the pharmacy hasn't received anything.

## 2019-10-02 NOTE — Telephone Encounter (Signed)
chlorpheniramine-HYDROcodone (TUSSIONEX PENNKINETIC ER) 10-8 MG/5ML SUER 140 mL 0 10/02/2019    Sig - Route: Take 5 mLs by mouth at bedtime as needed for cough. - Oral   Sent to pharmacy as: chlorpheniramine-HYDROcodone Stevphen Meuse [facility] ER) 10-8 MG/5ML Suspension Extended Release   Earliest Fill Date: 10/02/2019   E-Prescribing Status: Receipt confirmed by pharmacy (10/02/2019 2:50 PM EST)

## 2019-10-02 NOTE — Progress Notes (Signed)
Virtual Visit via Video Note  I connected with Sabrina [name] on 10/02/19 at  1:30 PM EST by a video enabled telemedicine application and verified that I am speaking with the correct person using two identifiers.  Location patient: home Location provider: work office Persons participating in the virtual visit: patient, provider  I discussed the limitations of evaluation and management by telemedicine and the availability of in person appointments. The patient expressed understanding and agreed to proceed.   HPI: Sabrina [name] was diagnosed with Covid in October..  Her symptoms have improved, however she continues to have cough.  She describes cough as a hacking cough worse when laying down at nighttime, sometimes produces a yellow phlegm but is usually dry.  She has tried all over-the-counter cough medications without relief.  She cannot use Tessalon Perles as she had a reaction in the past where they caused red eyes.  She is wondering what else she can take.  She states Covid was "the worst illness I have ever had".  She denies any current shortness of breath or dyspnea on exertion.  She works as a Fish farm manager.  While at work she had a leash wrapped around her hand and a dog pulled causing pain and swelling.  She did visit the urgent care and had x-rays that were negative for fracture.  She was told to elevate, ice and wear an Ace bandage which she has been doing, and is still with painful.   ROS: Constitutional: Denies fever, chills, diaphoresis, appetite change and fatigue.  HEENT: Denies photophobia, eye pain, redness, hearing loss, ear pain, congestion, sore throat, rhinorrhea, sneezing, mouth sores, trouble swallowing, neck pain, neck stiffness and tinnitus.   Respiratory: Denies SOB, DOE,  chest tightness,  and wheezing.   Cardiovascular: Denies chest pain, palpitations and leg swelling.  Gastrointestinal: Denies nausea, vomiting, abdominal pain, diarrhea, constipation, blood in  stool and abdominal distention.  Genitourinary: Denies dysuria, urgency, frequency, hematuria, flank pain and difficulty urinating.  Endocrine: Denies: hot or cold intolerance, sweats, changes in hair or nails, polyuria, polydipsia. Musculoskeletal: Denies myalgias, back pain, joint swelling, arthralgias and gait problem.  Skin: Denies pallor, rash and wound.  Neurological: Denies dizziness, seizures, syncope, weakness, light-headedness, numbness and headaches.  Hematological: Denies adenopathy. Easy bruising, personal or family bleeding history  Psychiatric/Behavioral: Denies suicidal ideation, mood changes, confusion, nervousness, sleep disturbance and agitation   Past Medical History:  Diagnosis Date  . Arthritis    "knees" (09/06/2017)  . Bullous pemphigus   . Cat bite 06/2014   to left elbow  . History of blood transfusion 1988   "when I had my baby"  . Muscle weakness of lower extremity 2001; 09/05/2017   "resolved after a couple weeks; ?" (09/06/2017)  . 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 Poisoning, snake bite 04/08/2016   "[place]; RUE"  . PONV (postoperative nausea and vomiting)   . S/P right knee surgery   . Situational anxiety   . Staph infection ~ 2015   "left elbow and finger"    Past Surgical History:  Procedure Laterality Date  . APPENDECTOMY  ~ 1987  . APPLICATION OF A-CELL OF EXTREMITY Left 08/05/2015   Procedure: APPLICATION OF A-CELL OF EXTREMITY;  Surgeon: Peggye Form, DO;  Location:  SURGERY CENTER;  Service: Plastics;  Laterality: Left;  . BREAST SURGERY Right 1990   "milk duct taken out"  . CHONDROPLASTY Right 02/19/2019   Procedure: CHONDROPLASTY; EXCISION EXOSTOSIS;  Surgeon: Valeria Batman, MD;  Location: [facility];  Service: Orthopedics;  Laterality: Right;  . DEBRIDEMENT AND CLOSURE WOUND Left 07/01/2015   Procedure: LEFT ELBOW EXCISION OF WOUND WITH PRIMARY CLOSURE 2X5 CM ;  Surgeon: Peggye Form, DO;   Location: [facility];  Service: Plastics;  Laterality: Left;  . ELBOW SURGERY Left X 23 in [place] <06/2015   from a cat bite; all I&D  . I & D EXTREMITY Left 07/08/2015   Procedure: IRRIGATION AND DEBRIDEMENT EXTREMITY, DRAINAGE OF LEFT ARM WOUND, A-CELL PLACEMENT, WOUND VAC PLACEMENT;  Surgeon: Alena Bills Dillingham, DO;  Location: [facility];  Service: Plastics;  Laterality: Left;  . INCISION AND DRAINAGE OF WOUND Left 08/05/2015   Procedure: IRRIGATION AND DEBRIDEMENT LEFT ELBOW WOUND, PLACEMENT OF ACELL;  Surgeon: Peggye Form, DO;  Location: [facility];  Service: Plastics;  Laterality: Left;  . KNEE ARTHROSCOPY WITH MEDIAL MENISECTOMY Right 02/19/2019   Procedure: RIGHT KNEE ARTHROSCOPY, DEBRIDEMENT, PARTIAL MEDIAL AND LATERAL MENISECTOMY;  Surgeon: Valeria Batman, MD;  Location: [facility];  Service: Orthopedics;  Laterality: Right;  . LAPAROSCOPIC CHOLECYSTECTOMY  1998  . SKIN GRAFT Left 2016   took from anterior thigh; placed at elbow  . TONSILLECTOMY  ~ 2000  . TOTAL ABDOMINAL HYSTERECTOMY  2003  . WRIST SURGERY Right 01/2016    Family History  Problem Relation Age of Onset  . Liver disease Mother   . Dementia Mother   . Cirrhosis Mother   . Prostate cancer Father   . Colon cancer Maternal Grandmother   . Ovarian cancer Maternal Aunt     SOCIAL HX:   reports that she has never smoked. She has never used smokeless tobacco. She reports that she does not drink alcohol or use drugs.   Current Outpatient Medications:  .  butalbital-acetaminophen-caffeine (FIORICET) 50-325-40 MG tablet, TAKE 1 TABLET BY MOUTH EVERY 6 HOURS AS NEEDED FOR HEADACHE, Disp: 10 tablet, Rfl: 3 .  chlorpheniramine-HYDROcodone (TUSSIONEX PENNKINETIC ER) 10-8 MG/5ML SUER, Take 5 mLs by mouth at bedtime as needed for cough., Disp: 140 mL, Rfl: 0 .  diazepam (VALIUM) 10 MG tablet, Take 1 tablet (10 mg) twice a day for one week then, IF NEEDED, increase to 1  tablet (10 mg) in the morning and 2 tablets (20 mg) at bedtime, Disp: 90 tablet, Rfl: 4 .  diclofenac Sodium (VOLTAREN) 1 % GEL, Apply topically as needed (knee pain)., Disp: , Rfl:  .  fluticasone (FLOVENT HFA) 44 MCG/ACT inhaler, Inhale 2 puffs into the lungs 2 (two) times daily., Disp: 1 Inhaler, Rfl: 0 .  gabapentin (NEURONTIN) 300 MG capsule, Take 2 capsules (600 mg total) by mouth 3 (three) times daily., Disp: 180 capsule, Rfl: 2 .  ipratropium (ATROVENT) 0.06 % nasal spray, Place 2 sprays into both nostrils 4 (four) times daily., Disp: 15 mL, Rfl: 0 .  sodium chloride (OCEAN) 0.65 % SOLN nasal spray, Place 1 spray into both nostrils as needed for congestion., Disp: 60 mL, Rfl: 0 .  traMADol (ULTRAM) 50 MG tablet, Take 1 tablet (50 mg total) by mouth every 6 (six) hours as needed., Disp: 15 tablet, Rfl: 0 .  venlafaxine XR (EFFEXOR-XR) 75 MG 24 hr capsule, 1  qam, Disp: 90 capsule, Rfl: 1  EXAM:   VITALS per patient if applicable: none reported  GENERAL: alert, oriented, appears well and in no acute distress  HEENT: atraumatic, conjunttiva clear, no obvious abnormalities on inspection of external nose and ears  NECK:  normal movements of the head and neck  LUNGS: on inspection no signs of respiratory distress, breathing rate appears normal, no obvious gross increased work of breathing, gasping or wheezing  CV: no obvious cyanosis  MS: moves all visible extremities without noticeable abnormality  PSYCH/NEURO: pleasant and cooperative, no obvious depression or anxiety, speech and thought processing grossly intact  ASSESSMENT AND PLAN:   COVID-19 Persistent cough for 3 weeks or longer  -Believe she needs in-person evaluation and possibly CXR. -Will arrange f/u at the respiratory clinic for further evaluation. -Tussionex cough syrup for now to take at bedtime as needed.  Swelling of left wrist -Agree with continued icing, wrapping and elevation only for now.     I discussed  the assessment and treatment plan with the patient. The patient was provided an opportunity to ask questions and all were answered. The patient agreed with the plan and demonstrated an understanding of the instructions.   The patient was advised to call back or seek an in-person evaluation if the symptoms worsen or if the condition fails to improve as anticipated.    Lelon Frohlich, MD  [facility]

## 2019-10-04 ENCOUNTER — Ambulatory Visit: Payer: Self-pay

## 2019-10-04 ENCOUNTER — Encounter: Payer: Self-pay | Admitting: Internal Medicine

## 2019-10-09 ENCOUNTER — Ambulatory Visit: Payer: Self-pay

## 2019-10-12 ENCOUNTER — Other Ambulatory Visit: Payer: Self-pay

## 2019-10-12 ENCOUNTER — Emergency Department (HOSPITAL_BASED_OUTPATIENT_CLINIC_OR_DEPARTMENT_OTHER)
Admission: EM | Admit: 2019-10-12 | Discharge: 2019-10-12 | Disposition: A | Discharge status: Home / Self Care | Payer: Worker's Compensation | Attending: Emergency Medicine | Admitting: Emergency Medicine

## 2019-10-12 ENCOUNTER — Encounter (HOSPITAL_BASED_OUTPATIENT_CLINIC_OR_DEPARTMENT_OTHER): Payer: Self-pay | Admitting: Emergency Medicine

## 2019-10-12 DIAGNOSIS — Y990 Civilian activity done for income or pay: Secondary | ICD-10-CM | POA: Diagnosis not present

## 2019-10-12 DIAGNOSIS — S71152A Open bite, left thigh, initial encounter: Secondary | ICD-10-CM

## 2019-10-12 DIAGNOSIS — Z79899 Other long term (current) drug therapy: Secondary | ICD-10-CM | POA: Diagnosis not present

## 2019-10-12 DIAGNOSIS — Y9389 Activity, other specified: Secondary | ICD-10-CM | POA: Insufficient documentation

## 2019-10-12 DIAGNOSIS — Y9289 Other specified places as the place of occurrence of the external cause: Secondary | ICD-10-CM | POA: Insufficient documentation

## 2019-10-12 DIAGNOSIS — S71132A Puncture wound without foreign body, left thigh, initial encounter: Secondary | ICD-10-CM | POA: Diagnosis present

## 2019-10-12 DIAGNOSIS — Z23 Encounter for immunization: Secondary | ICD-10-CM | POA: Diagnosis not present

## 2019-10-12 DIAGNOSIS — W540XXA Bitten by dog, initial encounter: Secondary | ICD-10-CM | POA: Diagnosis not present

## 2019-10-12 MED ORDER — AMOXICILLIN-POT CLAVULANATE 875-125 MG PO TABS: 1.0000 | ORAL_TABLET | Freq: Two times a day (BID) | ORAL | 0 refills | Status: DC

## 2019-10-12 MED ORDER — TETANUS-DIPHTH-ACELL PERTUSSIS 5-2.5-18.5 LF-MCG/0.5 IM SUSP
0.5000 mL | Freq: Once | INTRAMUSCULAR | Status: AC
  Administered 2019-10-12: 0.5 mL via INTRAMUSCULAR
  Filled 2019-10-12: qty 0.5

## 2019-10-12 NOTE — ED Triage Notes (Signed)
She is a Museum/gallery conservator. She was bitten by a dog to L thigh. Unsure of the animals vaccine status.

## 2019-10-12 NOTE — Discharge Instructions (Signed)
Continue to clean with soap and water.  Tylenol/ibuprofen for pain.  If starts to become very red/hot or draining anything that looks like pus return to the ER.

## 2019-10-12 NOTE — ED Notes (Signed)
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 Puncture marks noted left upper thigh, states upper puncture mark was oozing blood and "white" liquid this morning

## 2019-10-12 NOTE — ED Provider Notes (Signed)
[facility] EMERGENCY DEPARTMENT Provider Note   CSN: [id number] Arrival date & time: 10/12/19  1114     History Chief Complaint  Patient presents with  . Animal Bite    Sabrina [name] is a 54 y.o. female.  The history is provided by the patient.  Animal Bite Contact animal:  Dog Location:  Leg Leg injury location:  L upper leg Time since incident:  1 day Pain details:    Quality:  Aching and sore   Severity:  Moderate   Timing:  Constant   Progression:  Unchanged Incident location:  Outside Provoked: unprovoked   Notifications:  Animal control and law enforcement Animal's rabies vaccination status:  Out of date Animal in possession: yes   Tetanus status:  Out of date Relieved by:  Rest Worsened by:  Activity Ineffective treatments:  None tried Associated symptoms: swelling   Associated symptoms: no fever and no numbness   Associated symptoms comment:  As soon as this happened she cleaned her leg out with the antiseptic they have at the vet clinic and put peroxide on it this morning and had some drainage of blood and white liquid.  Currently the area is just sore and bruised.      Past Medical History:  Diagnosis Date  . Arthritis    "knees" (09/06/2017)  . Bullous pemphigus   . Cat bite 06/2014   to left elbow  . History of blood transfusion 1988   "when I had my baby"  . Muscle weakness of lower extremity 2001; 09/05/2017   "resolved after a couple weeks; ?" (09/06/2017)  . 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 Poisoning, snake bite 04/08/2016   "[place]; RUE"  . PONV (postoperative nausea and vomiting)   . S/P right knee surgery   . Situational anxiety   . Staph infection ~ 2015   "left elbow and finger"    Patient Active Problem List   Diagnosis Date Noted  . Primary osteoarthritis of right knee 08/06/2019  . 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 Other meniscus derangements, posterior horn of medial meniscus, left knee 02/19/2019  . Bucket handle  tear of lateral meniscus 02/19/2019  . Unilateral primary osteoarthritis, right knee 12/13/2018  . Panic disorder 12/13/2018  . Chronic headaches 12/13/2018  . Weakness 09/06/2017  . Depression 09/06/2017  . 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  . Generalized anxiety disorder with panic attacks 09/06/2017  . Right hand pain   . Wrist swelling   . Cellulitis of right upper extremity 04/11/2016  . Elbow pain 07/20/2015  . Anxiety 07/20/2015  . Tachycardia 07/04/2015  . 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HCC)   . Skin ulcer of upper arm, limited to breakdown of skin (HCC) 07/01/2015    Past Surgical History:  Procedure Laterality Date  . APPENDECTOMY  ~ 1987  . APPLICATION OF A-CELL OF EXTREMITY Left 08/05/2015   Procedure: APPLICATION OF A-CELL OF EXTREMITY;  Surgeon: Peggye Form, DO;  Location: [facility];  Service: Plastics;  Laterality: Left;  . BREAST SURGERY Right 1990   "milk duct taken out"  . CHONDROPLASTY Right 02/19/2019   Procedure: CHONDROPLASTY; EXCISION EXOSTOSIS;  Surgeon: Valeria Batman, MD;  Location: [facility];  Service: Orthopedics;  Laterality: Right;  . DEBRIDEMENT AND CLOSURE WOUND Left 07/01/2015   Procedure: LEFT ELBOW EXCISION OF WOUND WITH PRIMARY CLOSURE 2X5 CM ;  Surgeon: Peggye Form, DO;  Location: [facility];  Service: Plastics;  Laterality: Left;  .  ELBOW SURGERY Left X 23 in [place] <06/2015   from a cat bite; all I&D  . I & D EXTREMITY Left 07/08/2015   Procedure: IRRIGATION AND DEBRIDEMENT EXTREMITY, DRAINAGE OF LEFT ARM WOUND, A-CELL PLACEMENT, WOUND VAC PLACEMENT;  Surgeon: Alena Bills Dillingham, DO;  Location: [facility];  Service: Plastics;  Laterality: Left;  . INCISION AND DRAINAGE OF WOUND Left 08/05/2015   Procedure: IRRIGATION AND DEBRIDEMENT LEFT ELBOW WOUND, PLACEMENT OF ACELL;  Surgeon: Peggye Form, DO;  Location: [facility];  Service: Plastics;  Laterality: Left;  . KNEE  ARTHROSCOPY WITH MEDIAL MENISECTOMY Right 02/19/2019   Procedure: RIGHT KNEE ARTHROSCOPY, DEBRIDEMENT, PARTIAL MEDIAL AND LATERAL MENISECTOMY;  Surgeon: Valeria Batman, MD;  Location: [facility];  Service: Orthopedics;  Laterality: Right;  . LAPAROSCOPIC CHOLECYSTECTOMY  1998  . SKIN GRAFT Left 2016   took from anterior thigh; placed at elbow  . TONSILLECTOMY  ~ 2000  . TOTAL ABDOMINAL HYSTERECTOMY  2003  . WRIST SURGERY Right 01/2016     OB History   No obstetric history on file.     Family History  Problem Relation Age of Onset  . Liver disease Mother   . Dementia Mother   . Cirrhosis Mother   . Prostate cancer Father   . Colon cancer Maternal Grandmother   . Ovarian cancer Maternal Aunt     Social History   Tobacco Use  . Smoking status: Never Smoker  . Smokeless tobacco: Never Used  Substance Use Topics  . Alcohol use: Never    Alcohol/week: 0.0 standard drinks    Comment: social  . Drug use: No    Home Medications Prior to Admission medications   Medication Sig Start Date End Date Taking? Authorizing Provider  chlorpheniramine-HYDROcodone (TUSSIONEX PENNKINETIC ER) 10-8 MG/5ML SUER Take 5 mLs by mouth at bedtime as needed for cough. 10/02/19  Yes Philip Aspen, Limmie Patricia, MD  diazepam (VALIUM) 10 MG tablet Take 1 tablet (10 mg) twice a day for one week then, IF NEEDED, increase to 1 tablet (10 mg) in the morning and 2 tablets (20 mg) at bedtime 09/20/19  Yes Plovsky, Earvin Hansen, MD  venlafaxine XR (EFFEXOR-XR) 75 MG 24 hr capsule 1  qam 09/20/19  Yes Plovsky, Earvin Hansen, MD  amoxicillin-clavulanate (AUGMENTIN) 875-125 MG tablet Take 1 tablet by mouth every 12 (twelve) hours. 10/12/19   Gwyneth Sprout, MD  butalbital-acetaminophen-caffeine (FIORICET) 50-325-40 MG tablet TAKE 1 TABLET BY MOUTH EVERY 6 HOURS AS NEEDED FOR HEADACHE 09/26/19   Drema Dallas, DO  diclofenac Sodium (VOLTAREN) 1 % GEL Apply topically as needed (knee pain).    [provider]    fluticasone (FLOVENT HFA) 44 MCG/ACT inhaler Inhale 2 puffs into the lungs 2 (two) times daily. 08/29/19   Terressa Koyanagi, DO  gabapentin (NEURONTIN) 300 MG capsule Take 2 capsules (600 mg total) by mouth 3 (three) times daily. 07/18/19 09/30/19  Henderson Cloud, MD  ipratropium (ATROVENT) 0.06 % nasal spray Place 2 sprays into both nostrils 4 (four) times daily. 08/31/19   Cathie Hoops, Amy V, PA-C  sodium chloride (OCEAN) 0.65 % SOLN nasal spray Place 1 spray into both nostrils as needed for congestion. 08/05/19   Fawze, Mina A, PA-C  traMADol (ULTRAM) 50 MG tablet Take 1 tablet (50 mg total) by mouth every 6 (six) hours as needed. 09/30/19   Eustace Moore, MD    Allergies    Zofran Frazier Richards hcl], Droperidol, Flexeril [cyclobenzaprine], Morphine and  related, Tessalon [benzonatate], and Voltaren [diclofenac sodium]  Review of Systems   Review of Systems  Constitutional: Negative for fever.  Neurological: Negative for numbness.  All other systems reviewed and are negative.   Physical Exam Updated Vital Signs BP 117/66 (BP Location: Left Arm)   Pulse 89   Temp 98.8 F (37.1 C) (Oral)   Resp 16   Ht 5\' 4"  (1.626 m)   Wt 79.4 kg   SpO2 99%   BMI 30.04 kg/m   Physical Exam Vitals and nursing note reviewed.  Constitutional:      General: She is not in acute distress.    Appearance: Normal appearance. She is normal weight.  HENT:     Head: Normocephalic.  Eyes:     Pupils: Pupils are equal, round, and reactive to light.  Cardiovascular:     Pulses: Normal pulses.  Pulmonary:     Effort: Pulmonary effort is normal.  Musculoskeletal:        General: Swelling and tenderness present.     Left upper leg: Swelling and tenderness present.       Legs:  Skin:    General: Skin is warm and dry.  Neurological:     General: No focal deficit present.     Mental Status: She is alert and oriented to person, place, and time. Mental status is at baseline.  Psychiatric:         Mood and Affect: Mood normal.        Behavior: Behavior normal.        Thought Content: Thought content normal.     ED Results / Procedures / Treatments   Labs (all labs ordered are listed, but only abnormal results are displayed) Labs Reviewed - No data to display  EKG None  Radiology No results found.  Procedures Procedures (including critical care time)  Medications Ordered in ED Medications  Tdap (BOOSTRIX) injection 0.5 mL (has no administration in time range)    ED Course  I have reviewed the triage vital signs and the nursing notes.  Pertinent labs & imaging results that were available during my care of the patient were reviewed by me and considered in my medical decision making (see chart for details).    MDM Rules/Calculators/A&P                      Patient presenting after a dog bite to her thigh yesterday.  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  The dog is currently under quarantine and has had a rabies vaccine in the past but unclear if it is up-to-date.  Patient's last tetanus shot was in 2015 and it was updated today.  She does have significant ecchymosis and 2 puncture wounds on the left lateral thigh.  No evidence of infection at this time such as erythema, warmth or drainage.  However patient states there was some drainage of blood and white liquid this morning.  Will cover with Augmentin.  At this time patient does not need rabies vaccine as the dog is being quarantined.  She was given return precautions for any signs of infection.  Final Clinical Impression(s) / ED Diagnoses Final diagnoses:  Dog bite of left thigh, initial encounter    Rx / DC Orders ED Discharge Orders         Ordered    amoxicillin-clavulanate (AUGMENTIN) 875-125 MG tablet  Every 12 hours     10/12/19  1135           Gwyneth Sprout, MD 10/12/19 1142

## 2019-10-16 ENCOUNTER — Telehealth: Payer: Self-pay

## 2019-10-16 ENCOUNTER — Encounter: Payer: Self-pay | Admitting: Internal Medicine

## 2019-10-16 ENCOUNTER — Ambulatory Visit: Payer: Self-pay

## 2019-10-16 ENCOUNTER — Other Ambulatory Visit: Payer: Self-pay | Admitting: Internal Medicine

## 2019-10-16 DIAGNOSIS — G629 Polyneuropathy, unspecified: Secondary | ICD-10-CM

## 2019-10-16 NOTE — Telephone Encounter (Signed)
FYI

## 2019-10-16 NOTE — Telephone Encounter (Signed)
Patient called [facility] thinking it was the respiratory clinic. States that she wanted ro cancel her appointment for 10/16/2019 at 5:30pm due to her getting a fever, which she thinks is because of a dog bite. Appointment was cancelled and patient states that she will call PCP office back tomorrow and get rescheduled for respiratory clinic.

## 2019-10-17 MED FILL — GABAPENTIN 300 MG CAPSULE: 300 | 20 days supply | Qty: 180 | Fill #0

## 2019-10-17 NOTE — Telephone Encounter (Signed)
Last OV 10/02/19 Last refill 07/18/19 #180/2 Next OV not scheduled

## 2019-10-21 ENCOUNTER — Telehealth: Payer: Self-pay | Admitting: Internal Medicine

## 2019-10-21 MED FILL — DIAZEPAM 10 MG TABS: 10 | 30 days supply | Qty: 90 | Fill #4

## 2019-10-21 MED FILL — BUTALB-ACETAMIN-CAFF 50-325: 50-325-40 | 3 days supply | Qty: 10 | Fill #1

## 2019-10-21 NOTE — Telephone Encounter (Signed)
LMVM for the patient to contact the office to schedule a virtual visit for her dog bite on her thigh that is leaking, red, and has fever.  Was trying to work in with Dr. Caryl Never but there are no opens today.  I will send this message to [facility] to see if they can work her in with Early tomorrow or sometime this week.

## 2019-10-22 ENCOUNTER — Other Ambulatory Visit: Payer: Self-pay

## 2019-10-22 ENCOUNTER — Encounter (HOSPITAL_COMMUNITY): Payer: Self-pay | Admitting: Obstetrics and Gynecology

## 2019-10-22 ENCOUNTER — Ambulatory Visit: Payer: Self-pay | Admitting: Family Medicine

## 2019-10-22 ENCOUNTER — Emergency Department (HOSPITAL_COMMUNITY)
Admission: EM | Admit: 2019-10-22 | Discharge: 2019-10-22 | Disposition: A | Discharge status: Home / Self Care | Payer: Self-pay | Attending: Emergency Medicine | Admitting: Emergency Medicine

## 2019-10-22 ENCOUNTER — Telehealth (INDEPENDENT_AMBULATORY_CARE_PROVIDER_SITE_OTHER): Payer: Self-pay | Admitting: Family Medicine

## 2019-10-22 DIAGNOSIS — Z79899 Other long term (current) drug therapy: Secondary | ICD-10-CM | POA: Insufficient documentation

## 2019-10-22 DIAGNOSIS — Y939 Activity, unspecified: Secondary | ICD-10-CM | POA: Insufficient documentation

## 2019-10-22 DIAGNOSIS — T148XXA Other injury of unspecified body region, initial encounter: Secondary | ICD-10-CM

## 2019-10-22 DIAGNOSIS — W540XXA Bitten by dog, initial encounter: Secondary | ICD-10-CM | POA: Insufficient documentation

## 2019-10-22 DIAGNOSIS — M79606 Pain in leg, unspecified: Secondary | ICD-10-CM

## 2019-10-22 DIAGNOSIS — R509 Fever, unspecified: Secondary | ICD-10-CM

## 2019-10-22 DIAGNOSIS — S70372A Other superficial bite of left thigh, initial encounter: Secondary | ICD-10-CM | POA: Insufficient documentation

## 2019-10-22 DIAGNOSIS — Y9259 Other trade areas as the place of occurrence of the external cause: Secondary | ICD-10-CM | POA: Insufficient documentation

## 2019-10-22 DIAGNOSIS — Y990 Civilian activity done for income or pay: Secondary | ICD-10-CM | POA: Insufficient documentation

## 2019-10-22 DIAGNOSIS — R609 Edema, unspecified: Secondary | ICD-10-CM

## 2019-10-22 MED ORDER — AMOXICILLIN-POT CLAVULANATE 875-125 MG PO TABS: 1.0000 | ORAL_TABLET | Freq: Two times a day (BID) | ORAL | 0 refills | Status: AC

## 2019-10-22 MED ORDER — GABAPENTIN 300 MG PO CAPS: 600.0000 mg | ORAL_CAPSULE | Freq: Three times a day (TID) | ORAL | 0 refills | Status: DC

## 2019-10-22 MED ORDER — HYDROCODONE-ACETAMINOPHEN 5-325 MG PO TABS: 1.0000 | ORAL_TABLET | Freq: Four times a day (QID) | ORAL | 0 refills | Status: DC | PRN

## 2019-10-22 MED FILL — AMOX-CLAV 875-125 MG TABLET: 875-125 | 7 days supply | Qty: 14 | Fill #0

## 2019-10-22 MED FILL — HYDROCODON-APAP 5-325: 5-325 | 2 days supply | Qty: 20 | Fill #0

## 2019-10-22 NOTE — Telephone Encounter (Signed)
Refill has been sent. Patient has a virtual visit scheduled with Dr Selena Batten 10/22/19.

## 2019-10-22 NOTE — ED Provider Notes (Signed)
[facility] DEPT Provider Note   CSN: [id number] Arrival date & time: 10/22/19  1415     History Chief Complaint  Patient presents with  . Animal Bite    Sabrina [name] is a 54 y.o. female.  HPI Patient had a dog bite 12 days ago.  She works as a Camera operator.  She was bitten by a large [place] on the left mid thigh.  She reports that the dog clamped her leg and shook very hard.  She was treated with Augmentin which she has completed.  She reports she is continuing to have swelling and quite a bit of pain, particularly after standing for period of time.  Swelling improves after elevation.  She reports she continues to have periodic drainage from one of the puncture wounds.  She reports last night she had a temperature of 100.7.  She discussed with her physician her symptoms and there was concern for deeper infection.  Thus patient presents to the emergency department today.  No generalized constitutional symptoms.  Patient is not on any immunosuppressant medications.  He reports he does have a history of bullous pemphigus.    Past Medical History:  Diagnosis Date  . Arthritis    "knees" (09/06/2017)  . Bullous pemphigus   . Cat bite 06/2014   to left elbow  . History of blood transfusion 1988   "when I had my baby"  . Muscle weakness of lower extremity 2001; 09/05/2017   "resolved after a couple weeks; ?" (09/06/2017)  . 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[place])   . Poisoning, snake bite 04/08/2016   "[place]; RUE"  . PONV (postoperative nausea and vomiting)   . S/P right knee surgery   . Situational anxiety   . Staph infection ~ 2015   "left elbow and finger"    Patient Active Problem List   Diagnosis Date Noted  . Primary osteoarthritis of right knee 08/06/2019  . 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 Other meniscus derangements, posterior horn of medial meniscus, left knee 02/19/2019  . Bucket handle tear of lateral meniscus 02/19/2019  . Unilateral  primary osteoarthritis, right knee 12/13/2018  . Panic disorder 12/13/2018  . Chronic headaches 12/13/2018  . Weakness 09/06/2017  . Depression 09/06/2017  . 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  . Generalized anxiety disorder with panic attacks 09/06/2017  . Right hand pain   . Wrist swelling   . Cellulitis of right upper extremity 04/11/2016  . Elbow pain 07/20/2015  . Anxiety 07/20/2015  . Tachycardia 07/04/2015  . 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[place])   . Skin ulcer of upper arm, limited to breakdown of skin ([place]) 07/01/2015    Past Surgical History:  Procedure Laterality Date  . APPENDECTOMY  ~ 1987  . APPLICATION OF A-CELL OF EXTREMITY Left 08/05/2015   Procedure: APPLICATION OF A-CELL OF EXTREMITY;  Surgeon: Wallace Going, DO;  Location: [facility];  Service: Plastics;  Laterality: Left;  . BREAST SURGERY Right 1990   "milk duct taken out"  . CHONDROPLASTY Right 02/19/2019   Procedure: CHONDROPLASTY; EXCISION EXOSTOSIS;  Surgeon: Garald Balding, MD;  Location: [facility];  Service: Orthopedics;  Laterality: Right;  . DEBRIDEMENT AND CLOSURE WOUND Left 07/01/2015   Procedure: LEFT ELBOW EXCISION OF WOUND WITH PRIMARY CLOSURE 2X5 CM ;  Surgeon: Wallace Going, DO;  Location: [facility];  Service: Plastics;  Laterality: Left;  . ELBOW SURGERY Left X 23 in [place] <06/2015   from a  cat bite; all I&D  . I & D EXTREMITY Left 07/08/2015   Procedure: IRRIGATION AND DEBRIDEMENT EXTREMITY, DRAINAGE OF LEFT ARM WOUND, A-CELL PLACEMENT, WOUND VAC PLACEMENT;  Surgeon: Alena Bills Dillingham, DO;  Location: [facility];  Service: Plastics;  Laterality: Left;  . INCISION AND DRAINAGE OF WOUND Left 08/05/2015   Procedure: IRRIGATION AND DEBRIDEMENT LEFT ELBOW WOUND, PLACEMENT OF ACELL;  Surgeon: Peggye Form, DO;  Location: [facility];  Service: Plastics;  Laterality: Left;  . KNEE ARTHROSCOPY WITH MEDIAL MENISECTOMY Right 02/19/2019    Procedure: RIGHT KNEE ARTHROSCOPY, DEBRIDEMENT, PARTIAL MEDIAL AND LATERAL MENISECTOMY;  Surgeon: Valeria Batman, MD;  Location: [facility];  Service: Orthopedics;  Laterality: Right;  . LAPAROSCOPIC CHOLECYSTECTOMY  1998  . SKIN GRAFT Left 2016   took from anterior thigh; placed at elbow  . TONSILLECTOMY  ~ 2000  . TOTAL ABDOMINAL HYSTERECTOMY  2003  . WRIST SURGERY Right 01/2016     OB History   No obstetric history on file.     Family History  Problem Relation Age of Onset  . Liver disease Mother   . Dementia Mother   . Cirrhosis Mother   . Prostate cancer Father   . Colon cancer Maternal Grandmother   . Ovarian cancer Maternal Aunt     Social History   Tobacco Use  . Smoking status: Never Smoker  . Smokeless tobacco: Never Used  Substance Use Topics  . Alcohol use: Never    Alcohol/week: 0.0 standard drinks    Comment: social  . Drug use: No    Home Medications Prior to Admission medications   Medication Sig Start Date End Date Taking? Authorizing Provider  butalbital-acetaminophen-caffeine (FIORICET) 50-325-40 MG tablet TAKE 1 TABLET BY MOUTH EVERY 6 HOURS AS NEEDED FOR HEADACHE Patient taking differently: Take 1 tablet by mouth every 6 (six) hours as needed for headache.  09/26/19  Yes Everlena Cooper, Adam R, DO  chlorpheniramine-HYDROcodone (TUSSIONEX PENNKINETIC ER) 10-8 MG/5ML SUER Take 5 mLs by mouth at bedtime as needed for cough. 10/02/19  Yes Philip Aspen, Limmie Patricia, MD  diazepam (VALIUM) 10 MG tablet Take 1 tablet (10 mg) twice a day for one week then, IF NEEDED, increase to 1 tablet (10 mg) in the morning and 2 tablets (20 mg) at bedtime Patient taking differently: Take 5 mg by mouth 3 (three) times daily as needed for anxiety.  09/20/19  Yes Plovsky, Earvin Hansen, MD  gabapentin (NEURONTIN) 300 MG capsule Take 2 capsules (600 mg total) by mouth 3 (three) times daily. 10/22/19  Yes Philip Aspen, Limmie Patricia, MD  venlafaxine XR (EFFEXOR-XR) 75 MG 24 hr  capsule 1  qam Patient taking differently: Take 75 mg by mouth daily with breakfast.  09/20/19  Yes Plovsky, Earvin Hansen, MD  amoxicillin-clavulanate (AUGMENTIN) 875-125 MG tablet Take 1 tablet by mouth every 12 (twelve) hours. Patient not taking: Reported on 10/22/2019 10/12/19   Gwyneth Sprout, MD  fluticasone (FLOVENT HFA) 44 MCG/ACT inhaler Inhale 2 puffs into the lungs 2 (two) times daily. 08/29/19   Kriste Basque R, DO  ipratropium (ATROVENT) 0.06 % nasal spray Place 2 sprays into both nostrils 4 (four) times daily. 08/31/19   Cathie Hoops, Amy V, PA-C  sodium chloride (OCEAN) 0.65 % SOLN nasal spray Place 1 spray into both nostrils as needed for congestion. 08/05/19   Fawze, Mina A, PA-C  traMADol (ULTRAM) 50 MG tablet Take 1 tablet (50 mg total) by mouth every 6 (six) hours as needed. Patient not  taking: Reported on 10/22/2019 09/30/19   Eustace Moore, MD    Allergies    Zofran Frazier Richards hcl], Droperidol, Flexeril [cyclobenzaprine], Morphine and related, Tessalon [benzonatate], and Voltaren [diclofenac sodium]  Review of Systems   Review of Systems 10 Systems reviewed and are negative for acute change except as noted in the HPI.  Physical Exam Updated Vital Signs BP 92/74 (BP Location: Left Arm)   Pulse (!) 105   Temp 98 F (36.7 C) (Oral)   Resp 18   SpO2 98%   Physical Exam Constitutional:      Comments: Patient is alert and well in appearance.  Nontoxic.  HENT:     Head: Normocephalic and atraumatic.  Eyes:     Extraocular Movements: Extraocular movements intact.     Conjunctiva/sclera: Conjunctivae normal.  Cardiovascular:     Rate and Rhythm: Normal rate and regular rhythm.  Pulmonary:     Effort: Pulmonary effort is normal.     Breath sounds: Normal breath sounds.  Abdominal:     General: There is no distension.     Palpations: Abdomen is soft.     Tenderness: There is no abdominal tenderness. There is no guarding.  Musculoskeletal:     Comments: See attached images.  No  inguinal lymphadenopathy or tenderness on the left.  Ecchymotic bruising in left mid thigh as imaged.  Associated with this there is a 12 x 10 palpable hematoma that is firm.  No drainage from the puncture at this time.  No cellulitic changes.  The area around and adjacent to the bite wound is still tender to palpation.  Back of the leg is nontender.  Popliteal fossa nontender.  Soft tissues of the lower leg nontender and pliable.  Neurological:     General: No focal deficit present.     Mental Status: She is oriented to person, place, and time.     Coordination: Coordination normal.  Psychiatric:        Mood and Affect: Mood normal.       ED Results / Procedures / Treatments   Labs (all labs ordered are listed, but only abnormal results are displayed) Labs Reviewed - No data to display  EKG None  Radiology No results found.  Procedures Procedures (including critical care time)  Medications Ordered in ED Medications - No data to display  ED Course  I have reviewed the triage vital signs and the nursing notes.  Pertinent labs & imaging results that were available during my care of the patient were reviewed by me and considered in my medical decision making (see chart for details).    MDM Rules/Calculators/A&P                      Patient still has a very large resolving hematoma.  The wound however appears consistent with normal healing.  No cellulitic changes of the soft tissues.  No signs of any compartment syndrome.  Tenderness is within a wide area around the wound but does not include any tender lymphadenopathy at the groin or popliteal fossa.  At this time, will opt to continue 1 more week of Augmentin as well as increased elevating and pain control with Vicodin as needed.  Return precautions reviewed. Final Clinical Impression(s) / ED Diagnoses Final diagnoses:  Dog bite, initial encounter  Hematoma    Rx / DC Orders ED Discharge Orders    None       Arby Barrette, MD 10/22/19 1558

## 2019-10-22 NOTE — Patient Instructions (Signed)
Go to the Emergency Department for evaluation as we discussed. Please call [phone] if you change your mind and feel unsafe in any way with transportation to the evaluation.

## 2019-10-22 NOTE — Discharge Instructions (Signed)
1.  Return to the emergency department if you have worsening swelling, fever, drainage. 2.  Recheck with your doctor within 3 days.

## 2019-10-22 NOTE — ED Triage Notes (Signed)
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 Patient reports she did a virtual visit with her PCP and was told to come to the ED for evaluation. Patient reports she had her tetanus updated earlier in the year and has been on Augmentin for the bite

## 2019-10-22 NOTE — Telephone Encounter (Signed)
Appointment scheduled with Dr Selena Batten 10/22/19

## 2019-10-22 NOTE — Progress Notes (Signed)
Virtual Visit via Video Note  I connected with Sabrina [name]  on 10/22/19 at 12:40 PM EST by a video enabled telemedicine application and verified that I am speaking with the correct person using two identifiers.  Location patient: home Location provider:work or home office Persons participating in the virtual visit: patient, provider  I discussed the limitations of evaluation and management by telemedicine and the availability of in person appointments. The patient expressed understanding and agreed to proceed.   HPI:  Acute visit for skin infection: -suffered dog bite from domestic, contained pet on 1/29 to L thigh -was seen in the ER the next day, tetanus was updated, case was reported and she was started on Augmentin 875 bid for 7 days (finished several days ago) -today pt reports most of the tooth marks have healed up, one wound has still not healed -  red, swollen - had hematoma there and bruising, but also has swelling, she has been cleaning it with chlorhexidine from work, doing soaks in hot water, putting ice on it.  -Yesterday she "got pus out of it", and developed fever yesterday of 100.7, chills, body aches and her leg was "hurting so bad." Now reports has difficulty walking and using the leg due to pain. Reports tylenol helps to take down the fever (taking) - but it does not help with the pain. 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  Past Medical History:  Diagnosis Date  . Arthritis    "knees" (09/06/2017)  . Bullous pemphigus   . Cat bite 06/2014   to left elbow  . History of blood transfusion 1988   "when I had my  baby"  . Muscle weakness of lower extremity 2001; 09/05/2017   "resolved after a couple weeks; ?" (09/06/2017)  . 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 Poisoning, snake bite 04/08/2016   "[place]; RUE"  . PONV (postoperative nausea and vomiting)   . S/P right knee surgery   . Situational anxiety   . Staph infection ~ 2015   "left elbow and finger"    Past Surgical History:  Procedure Laterality Date  . APPENDECTOMY  ~ 1987  . APPLICATION OF A-CELL OF EXTREMITY Left 08/05/2015   Procedure: APPLICATION OF A-CELL OF EXTREMITY;  Surgeon: Peggye Form, DO;  Location: [facility];  Service: Plastics;  Laterality: Left;  . BREAST SURGERY Right 1990   "milk duct taken out"  . CHONDROPLASTY Right 02/19/2019   Procedure: CHONDROPLASTY; EXCISION EXOSTOSIS;  Surgeon: Valeria Batman, MD;  Location: [facility];  Service: Orthopedics;  Laterality: Right;  . DEBRIDEMENT AND CLOSURE WOUND Left 07/01/2015   Procedure: LEFT ELBOW EXCISION OF WOUND WITH PRIMARY CLOSURE 2X5 CM ;  Surgeon: Peggye Form, DO;  Location: [facility];  Service: Plastics;  Laterality: Left;  . ELBOW SURGERY Left X 23 in [place] <06/2015   from a cat bite; all I&D  . I & D EXTREMITY Left 07/08/2015   Procedure: IRRIGATION AND DEBRIDEMENT EXTREMITY, DRAINAGE OF LEFT ARM WOUND, A-CELL PLACEMENT, WOUND VAC PLACEMENT;  Surgeon: Alena Bills Dillingham, DO;  Location: [facility];  Service: Plastics;  Laterality: Left;  . INCISION AND DRAINAGE OF WOUND Left 08/05/2015   Procedure: IRRIGATION AND DEBRIDEMENT LEFT ELBOW WOUND, PLACEMENT OF ACELL;  Surgeon: Peggye Form, DO;  Location: [facility];  Service: Plastics;  Laterality: Left;  . KNEE ARTHROSCOPY WITH MEDIAL MENISECTOMY Right 02/19/2019   Procedure: RIGHT KNEE ARTHROSCOPY, DEBRIDEMENT, PARTIAL MEDIAL AND LATERAL MENISECTOMY;  Surgeon: Valeria Batman, MD;  Location: [facility];  Service:  Orthopedics;  Laterality: Right;  . LAPAROSCOPIC CHOLECYSTECTOMY  1998  . SKIN GRAFT Left 2016   took from anterior thigh; placed at elbow  . TONSILLECTOMY  ~ 2000  . TOTAL ABDOMINAL HYSTERECTOMY  2003  . WRIST SURGERY Right 01/2016    Family History  Problem Relation Age of Onset  . Liver disease Mother   . Dementia Mother   . Cirrhosis Mother   . Prostate cancer Father   . Colon cancer Maternal Grandmother   . Ovarian cancer Maternal Aunt     SOCIAL HX: see hpi   Current Outpatient Medications:  .  amoxicillin-clavulanate (AUGMENTIN) 875-125 MG tablet, Take 1 tablet by mouth every 12 (twelve) hours., Disp: 14 tablet, Rfl: 0 .  butalbital-acetaminophen-caffeine (FIORICET) 50-325-40 MG tablet, TAKE 1 TABLET BY MOUTH EVERY 6 HOURS AS NEEDED FOR HEADACHE, Disp: 10 tablet, Rfl: 3 .  chlorpheniramine-HYDROcodone (TUSSIONEX PENNKINETIC ER) 10-8 MG/5ML SUER, Take 5 mLs by mouth at bedtime as needed for cough., Disp: 140 mL, Rfl: 0 .  diazepam (VALIUM) 10 MG tablet, Take 1 tablet (10 mg) twice a day for one week then, IF NEEDED, increase to 1 tablet (10 mg) in the morning and 2 tablets (20 mg) at bedtime, Disp: 90 tablet, Rfl: 4 .  diclofenac Sodium (VOLTAREN) 1 % GEL, Apply topically as needed (knee pain)., Disp: , Rfl:  .  fluticasone (FLOVENT HFA) 44 MCG/ACT inhaler, Inhale 2 puffs into the lungs 2 (two) times daily., Disp: 1 Inhaler, Rfl: 0 .  gabapentin (NEURONTIN) 300 MG capsule, Take 2 capsules (600 mg total) by mouth 3 (three) times daily., Disp: 180 capsule, Rfl: 0 .  ipratropium (ATROVENT) 0.06 % nasal spray, Place 2 sprays into both nostrils 4 (four) times daily., Disp: 15 mL, Rfl: 0 .  sodium chloride (OCEAN) 0.65 % SOLN nasal spray, Place 1 spray into both nostrils as needed for congestion., Disp: 60 mL, Rfl: 0 .  traMADol (ULTRAM) 50 MG tablet, Take 1 tablet (50 mg total) by mouth every 6 (six) hours as needed., Disp: 15 tablet, Rfl: 0 .  venlafaxine XR (EFFEXOR-XR) 75 MG 24  hr capsule, 1  qam, Disp: 90 capsule, Rfl: 1  EXAM:  VITALS per patient if applicable: temp 100.7 last night, now on tylenol  GENERAL: alert, oriented, appears well and in no acute distress  HEENT: atraumatic, conjunttiva clear, no obvious abnormalities on inspection of external nose and ears  NECK: normal movements of the head and neck  LUNGS: on inspection no signs of respiratory distress, breathing rate appears normal, no obvious gross SOB, gasping or wheezing  CV: no obvious cyanosis  MS/SKIN: reports pain with using L leg, large area of swelling/bruising of the L Thigh - small lac with dried blood in center of this area, pt estimate area to be about 6-8 inches in diameter and tender to touch per patient.  PSYCH/NEURO: pleasant and cooperative, no obvious depression or anxiety, speech and thought processing grossly intact  ASSESSMENT  AND PLAN:  Discussed the following assessment and plan:  Dog bite, initial encounter  Fever, unspecified fever cause  Pain of lower extremity, unspecified laterality  Swelling  -we discussed possible serious and likely etiologies, options for evaluation and workup, limitations of telemedicine visit vs in person visit, treatment, treatment risks and precautions. I am concerned with the reported degree of pain, swelling and the fact that she has developed worsening pain, fevers, aches and chills. She did Augmentin appropriately initially, but seems to be worsening. Advised needs inperson eval for possible labs, imaging, surgical consult and possible IV abx. Hoping not a deeper underlying infection, but concern for this given symptoms. Patient agrees to seek care now at nearest ER for evaluation. Advised staff to contact ER triage to provide case details. Pt declines EMS transport - denies dizziness but plans to have friend take her due to pain.   I discussed the assessment and treatment plan with the patient. The patient was provided an opportunity to  ask questions and all were answered. The patient agreed with the plan and demonstrated an understanding of the instructions.   The patient was advised to call back or seek an in-person evaluation if the symptoms worsen or if the condition fails to improve as anticipated.   Sabrina Kern, DO

## 2019-10-23 ENCOUNTER — Ambulatory Visit: Payer: Self-pay | Admitting: Internal Medicine

## 2019-10-23 ENCOUNTER — Telehealth (INDEPENDENT_AMBULATORY_CARE_PROVIDER_SITE_OTHER): Payer: Self-pay | Admitting: Internal Medicine

## 2019-10-23 DIAGNOSIS — W540XXD Bitten by dog, subsequent encounter: Secondary | ICD-10-CM

## 2019-10-23 DIAGNOSIS — T148XXA Other injury of unspecified body region, initial encounter: Secondary | ICD-10-CM

## 2019-10-23 MED ORDER — MELOXICAM 15 MG PO TABS: 15.0000 mg | ORAL_TABLET | Freq: Every day | ORAL | 0 refills | Status: DC

## 2019-10-23 MED FILL — MELOXICAM 15 MG TABLET: 15 | 30 days supply | Qty: 30 | Fill #0

## 2019-10-23 NOTE — Progress Notes (Signed)
Virtual Visit via Video Note  I connected with Sabrina [name] on 10/23/19 at  2:00 PM EST by a video enabled telemedicine application and verified that I am speaking with the correct person using two identifiers.  Location patient: home Location provider: work office Persons participating in the virtual visit: patient, provider  I discussed the limitations of evaluation and management by telemedicine and the availability of in person appointments. The patient expressed understanding and agreed to proceed.   HPI: Sabrina [name] has now had several visits following a dog bite while at work.  She is a Museum/gallery conservator.  At the urging of another provider in the clinic, she revisited the emergency department yesterday where she was given another course of antibiotics and 20 pills of hydrocodone.  She took 2 tablets of hydrocodone last night and 2 this morning but she states the pain is still significant.  I have reviewed pictures and impression from the ED physician last night.  There was significant bruising and hematoma but no purulence was noted.  She received a tetanus booster, the dog did not have proof of rabies vaccination but has now been released from quarantine.   ROS: Constitutional: Denies fever, chills, diaphoresis, appetite change and fatigue.  HEENT: Denies photophobia, eye pain, redness, hearing loss, ear pain, congestion, sore throat, rhinorrhea, sneezing, mouth sores, trouble swallowing, neck pain, neck stiffness and tinnitus.   Respiratory: Denies SOB, DOE, cough, chest tightness,  and wheezing.   Cardiovascular: Denies chest pain, palpitations and leg swelling.  Gastrointestinal: Denies nausea, vomiting, abdominal pain, diarrhea, constipation, blood in stool and abdominal distention.  Genitourinary: Denies dysuria, urgency, frequency, hematuria, flank pain and difficulty urinating.  Endocrine: Denies: hot or cold intolerance, sweats, changes in hair or nails, polyuria,  polydipsia. Musculoskeletal: Denies  back pain, joint swelling, arthralgias and gait problem.  Skin: Denies pallor, rash and wound.  Neurological: Denies dizziness, seizures, syncope, weakness, light-headedness, numbness and headaches.  Hematological: Denies adenopathy. Easy bruising, personal or family bleeding history  Psychiatric/Behavioral: Denies suicidal ideation, mood changes, confusion, nervousness, sleep disturbance and agitation   Past Medical History:  Diagnosis Date  . Arthritis    "knees" (09/06/2017)  . Bullous pemphigus   . Cat bite 06/2014   to left elbow  . History of blood transfusion 1988   "when I had my baby"  . Muscle weakness of lower extremity 2001; 09/05/2017   "resolved after a couple weeks; ?" (09/06/2017)  . 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 Poisoning, snake bite 04/08/2016   "[place]; RUE"  . PONV (postoperative nausea and vomiting)   . S/P right knee surgery   . Situational anxiety   . Staph infection ~ 2015   "left elbow and finger"    Past Surgical History:  Procedure Laterality Date  . APPENDECTOMY  ~ 1987  . APPLICATION OF A-CELL OF EXTREMITY Left 08/05/2015   Procedure: APPLICATION OF A-CELL OF EXTREMITY;  Surgeon: Peggye Form, DO;  Location: [facility];  Service: Plastics;  Laterality: Left;  . BREAST SURGERY Right 1990   "milk duct taken out"  . CHONDROPLASTY Right 02/19/2019   Procedure: CHONDROPLASTY; EXCISION EXOSTOSIS;  Surgeon: Valeria Batman, MD;  Location: [facility];  Service: Orthopedics;  Laterality: Right;  . DEBRIDEMENT AND CLOSURE WOUND Left 07/01/2015   Procedure: LEFT ELBOW EXCISION OF WOUND WITH PRIMARY CLOSURE 2X5 CM ;  Surgeon: Peggye Form, DO;  Location: [facility];  Service: Plastics;  Laterality:  Left;  . ELBOW SURGERY Left X 23 in [place] <06/2015   from a cat bite; all I&D  . I & D EXTREMITY Left 07/08/2015   Procedure: IRRIGATION AND DEBRIDEMENT  EXTREMITY, DRAINAGE OF LEFT ARM WOUND, A-CELL PLACEMENT, WOUND VAC PLACEMENT;  Surgeon: Loel Lofty Dillingham, DO;  Location: [facility];  Service: Plastics;  Laterality: Left;  . INCISION AND DRAINAGE OF WOUND Left 08/05/2015   Procedure: IRRIGATION AND DEBRIDEMENT LEFT ELBOW WOUND, PLACEMENT OF ACELL;  Surgeon: Wallace Going, DO;  Location: [facility];  Service: Plastics;  Laterality: Left;  . KNEE ARTHROSCOPY WITH MEDIAL MENISECTOMY Right 02/19/2019   Procedure: RIGHT KNEE ARTHROSCOPY, DEBRIDEMENT, PARTIAL MEDIAL AND LATERAL MENISECTOMY;  Surgeon: Garald Balding, MD;  Location: [facility];  Service: Orthopedics;  Laterality: Right;  . LAPAROSCOPIC CHOLECYSTECTOMY  1998  . SKIN GRAFT Left 2016   took from anterior thigh; placed at elbow  . TONSILLECTOMY  ~ 2000  . TOTAL ABDOMINAL HYSTERECTOMY  2003  . WRIST SURGERY Right 01/2016    Family History  Problem Relation Age of Onset  . Liver disease Mother   . Dementia Mother   . Cirrhosis Mother   . Prostate cancer Father   . Colon cancer Maternal Grandmother   . Ovarian cancer Maternal Aunt     SOCIAL HX:   reports that she has never smoked. She has never used smokeless tobacco. She reports that she does not drink alcohol or use drugs.   Current Outpatient Medications:  .  amoxicillin-clavulanate (AUGMENTIN) 875-125 MG tablet, Take 1 tablet by mouth every 12 (twelve) hours for 7 days., Disp: 14 tablet, Rfl: 0 .  butalbital-acetaminophen-caffeine (FIORICET) 50-325-40 MG tablet, TAKE 1 TABLET BY MOUTH EVERY 6 HOURS AS NEEDED FOR HEADACHE (Patient taking differently: Take 1 tablet by mouth every 6 (six) hours as needed for headache. ), Disp: 10 tablet, Rfl: 3 .  chlorpheniramine-HYDROcodone (TUSSIONEX PENNKINETIC ER) 10-8 MG/5ML SUER, Take 5 mLs by mouth at bedtime as needed for cough., Disp: 140 mL, Rfl: 0 .  diazepam (VALIUM) 10 MG tablet, Take 1 tablet (10 mg) twice a day for one week then, IF NEEDED,  increase to 1 tablet (10 mg) in the morning and 2 tablets (20 mg) at bedtime (Patient taking differently: Take 5 mg by mouth 3 (three) times daily as needed for anxiety. ), Disp: 90 tablet, Rfl: 4 .  fluticasone (FLOVENT HFA) 44 MCG/ACT inhaler, Inhale 2 puffs into the lungs 2 (two) times daily., Disp: 1 Inhaler, Rfl: 0 .  gabapentin (NEURONTIN) 300 MG capsule, Take 2 capsules (600 mg total) by mouth 3 (three) times daily., Disp: 180 capsule, Rfl: 0 .  HYDROcodone-acetaminophen (NORCO/VICODIN) 5-325 MG tablet, Take 1-2 tablets by mouth every 6 (six) hours as needed for moderate pain or severe pain., Disp: 20 tablet, Rfl: 0 .  ipratropium (ATROVENT) 0.06 % nasal spray, Place 2 sprays into both nostrils 4 (four) times daily., Disp: 15 mL, Rfl: 0 .  meloxicam (MOBIC) 15 MG tablet, Take 1 tablet (15 mg total) by mouth daily., Disp: 30 tablet, Rfl: 0 .  sodium chloride (OCEAN) 0.65 % SOLN nasal spray, Place 1 spray into both nostrils as needed for congestion., Disp: 60 mL, Rfl: 0 .  traMADol (ULTRAM) 50 MG tablet, Take 1 tablet (50 mg total) by mouth every 6 (six) hours as needed. (Patient not taking: Reported on 10/22/2019), Disp: 15 tablet, Rfl: 0 .  venlafaxine XR (EFFEXOR-XR) 75 MG 24 hr capsule, 1  qam (Patient taking differently: Take 75 mg by mouth daily with breakfast. ), Disp: 90 capsule, Rfl: 1  EXAM:   VITALS per patient if applicable: None reported  GENERAL: alert, oriented, appears well and in no acute distress  HEENT: atraumatic, conjunttiva clear, no obvious abnormalities on inspection of external nose and ears  NECK: normal movements of the head and neck  LUNGS: on inspection no signs of respiratory distress, breathing rate appears normal, no obvious gross increased work of breathing, gasping or wheezing  CV: no obvious cyanosis  MS: moves all visible extremities without noticeable abnormality  PSYCH/NEURO: pleasant and cooperative, no obvious depression or anxiety, speech and  thought processing grossly intact  ASSESSMENT AND PLAN:   Dog bite, subsequent encounter  -Will give her some meloxicam to take 1 tablet daily in addition to the hydrocodone. -She has been urged to complete course of amoxicillin.    I discussed the assessment and treatment plan with the patient. The patient was provided an opportunity to ask questions and all were answered. The patient agreed with the plan and demonstrated an understanding of the instructions.   The patient was advised to call back or seek an in-person evaluation if the symptoms worsen or if the condition fails to improve as anticipated.    Chaya Jan, MD  [facility]

## 2019-10-24 ENCOUNTER — Other Ambulatory Visit: Payer: Self-pay

## 2019-10-24 ENCOUNTER — Telehealth (INDEPENDENT_AMBULATORY_CARE_PROVIDER_SITE_OTHER): Payer: Self-pay | Admitting: Family Medicine

## 2019-10-24 DIAGNOSIS — S71112A Laceration without foreign body, left thigh, initial encounter: Secondary | ICD-10-CM

## 2019-10-24 DIAGNOSIS — W540XXD Bitten by dog, subsequent encounter: Secondary | ICD-10-CM

## 2019-10-24 NOTE — Patient Instructions (Addendum)
Please go to the emergency room today for further evaluation and management. At this point with the symptoms you reported of worsening symptoms, unbearable pain and foul smelling drainage you need an in-person evaluation and perhaps imaging and/or surgical management and other treatments.    WORK SLIP:  Please excuse patient Sabrina [name],  12/18/1965, from work today for a medical illness. She was seen today, 10/24/2019, for a telemedicine visit and an in-person evaluation for further management was advised. Sincerely: E-signature: Dr. Kriste Basque, DO [facility] Ph: [phone]

## 2019-10-24 NOTE — Progress Notes (Addendum)
Virtual Visit via Video Note  I connected with Sabrina [name]  on 10/24/19 at  1:00 PM EST by a video enabled telemedicine application and verified that I am speaking with the correct person using two identifiers.  Location patient: home Location provider:work or home office Persons participating in the virtual visit: patient, provider  I discussed the limitations of evaluation and management by telemedicine and the availability of in person appointments. The patient expressed understanding and agreed to proceed.   HPI:  Acute visit for dog bite L thigh: -she has been to the emergency room and to her PCP for this very recently, saw PCP yesterday, reports worse today  -2 weeks since was bit by a dog at her job, dog was contained -initially treated with abx  -report persistent drainage from the wound and pain, left work today because reports wound was draining through her dressing and her scrubs and the drainage was foul smelling, reports the pain was unbearable and she could not work, reports large 8 inch area of tight swelling, reports feels like it needs to drain -has tried meloxicam and hydrocodone for the pain - nothing helps for the pain -reports when she went to the ER 2/9 they gave her Augmentin and Hydrocodone  -she wasn't sure what to do today so called PCP office but PCP unavailable -reports is miserable -reports temp has been around 99 - which she report is high for her - pt reports that the ER provider told her the infection was probably deep and that if it was not improving she would need to return for debridement.   ROS: See pertinent positives and negatives per HPI.  Past Medical History:  Diagnosis Date  . Arthritis    "knees" (09/06/2017)  . Bullous pemphigus   . Cat bite 06/2014   to left elbow  . History of blood transfusion 1988   "when I had my baby"  . Muscle weakness of lower extremity 2001; 09/05/2017   "resolved after a couple weeks; ?" (09/06/2017)  .  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 Poisoning, snake bite 04/08/2016   "[place]; RUE"  . PONV (postoperative nausea and vomiting)   . S/P right knee surgery   . Situational anxiety   . Staph infection ~ 2015   "left elbow and finger"    Past Surgical History:  Procedure Laterality Date  . APPENDECTOMY  ~ 1987  . APPLICATION OF A-CELL OF EXTREMITY Left 08/05/2015   Procedure: APPLICATION OF A-CELL OF EXTREMITY;  Surgeon: Peggye Form, DO;  Location: [facility];  Service: Plastics;  Laterality: Left;  . BREAST SURGERY Right 1990   "milk duct taken out"  . CHONDROPLASTY Right 02/19/2019   Procedure: CHONDROPLASTY; EXCISION EXOSTOSIS;  Surgeon: Valeria Batman, MD;  Location: [facility];  Service: Orthopedics;  Laterality: Right;  . DEBRIDEMENT AND CLOSURE WOUND Left 07/01/2015   Procedure: LEFT ELBOW EXCISION OF WOUND WITH PRIMARY CLOSURE 2X5 CM ;  Surgeon: Peggye Form, DO;  Location: [facility];  Service: Plastics;  Laterality: Left;  . ELBOW SURGERY Left X 23 in [place] <06/2015   from a cat bite; all I&D  . I & D EXTREMITY Left 07/08/2015   Procedure: IRRIGATION AND DEBRIDEMENT EXTREMITY, DRAINAGE OF LEFT ARM WOUND, A-CELL PLACEMENT, WOUND VAC PLACEMENT;  Surgeon: Alena Bills Dillingham, DO;  Location: [facility];  Service: Plastics;  Laterality: Left;  . INCISION AND DRAINAGE OF WOUND Left 08/05/2015   Procedure: IRRIGATION AND  DEBRIDEMENT LEFT ELBOW WOUND, PLACEMENT OF ACELL;  Surgeon: Peggye Form, DO;  Location: [facility];  Service: Plastics;  Laterality: Left;  . KNEE ARTHROSCOPY WITH MEDIAL MENISECTOMY Right 02/19/2019   Procedure: RIGHT KNEE ARTHROSCOPY, DEBRIDEMENT, PARTIAL MEDIAL AND LATERAL MENISECTOMY;  Surgeon: Valeria Batman, MD;  Location: [facility];  Service: Orthopedics;  Laterality: Right;  . LAPAROSCOPIC CHOLECYSTECTOMY  1998  . SKIN GRAFT Left 2016   took from anterior  thigh; placed at elbow  . TONSILLECTOMY  ~ 2000  . TOTAL ABDOMINAL HYSTERECTOMY  2003  . WRIST SURGERY Right 01/2016    Family History  Problem Relation Age of Onset  . Liver disease Mother   . Dementia Mother   . Cirrhosis Mother   . Prostate cancer Father   . Colon cancer Maternal Grandmother   . Ovarian cancer Maternal Aunt     SOCIAL HX:see hpi   Current Outpatient Medications:  .  amoxicillin-clavulanate (AUGMENTIN) 875-125 MG tablet, Take 1 tablet by mouth every 12 (twelve) hours for 7 days., Disp: 14 tablet, Rfl: 0 .  butalbital-acetaminophen-caffeine (FIORICET) 50-325-40 MG tablet, TAKE 1 TABLET BY MOUTH EVERY 6 HOURS AS NEEDED FOR HEADACHE (Patient taking differently: Take 1 tablet by mouth every 6 (six) hours as needed for headache. ), Disp: 10 tablet, Rfl: 3 .  chlorpheniramine-HYDROcodone (TUSSIONEX PENNKINETIC ER) 10-8 MG/5ML SUER, Take 5 mLs by mouth at bedtime as needed for cough., Disp: 140 mL, Rfl: 0 .  diazepam (VALIUM) 10 MG tablet, Take 1 tablet (10 mg) twice a day for one week then, IF NEEDED, increase to 1 tablet (10 mg) in the morning and 2 tablets (20 mg) at bedtime (Patient taking differently: Take 5 mg by mouth 3 (three) times daily as needed for anxiety. ), Disp: 90 tablet, Rfl: 4 .  fluticasone (FLOVENT HFA) 44 MCG/ACT inhaler, Inhale 2 puffs into the lungs 2 (two) times daily., Disp: 1 Inhaler, Rfl: 0 .  gabapentin (NEURONTIN) 300 MG capsule, Take 2 capsules (600 mg total) by mouth 3 (three) times daily., Disp: 180 capsule, Rfl: 0 .  HYDROcodone-acetaminophen (NORCO/VICODIN) 5-325 MG tablet, Take 1-2 tablets by mouth every 6 (six) hours as needed for moderate pain or severe pain., Disp: 20 tablet, Rfl: 0 .  ipratropium (ATROVENT) 0.06 % nasal spray, Place 2 sprays into both nostrils 4 (four) times daily., Disp: 15 mL, Rfl: 0 .  meloxicam (MOBIC) 15 MG tablet, Take 1 tablet (15 mg total) by mouth daily., Disp: 30 tablet, Rfl: 0 .  sodium chloride (OCEAN) 0.65  % SOLN nasal spray, Place 1 spray into both nostrils as needed for congestion., Disp: 60 mL, Rfl: 0 .  traMADol (ULTRAM) 50 MG tablet, Take 1 tablet (50 mg total) by mouth every 6 (six) hours as needed. (Patient not taking: Reported on 10/22/2019), Disp: 15 tablet, Rfl: 0 .  venlafaxine XR (EFFEXOR-XR) 75 MG 24 hr capsule, 1  qam (Patient taking differently: Take 75 mg by mouth daily with breakfast. ), Disp: 90 capsule, Rfl: 1  EXAM:  VITALS per patient if applicable:  GENERAL: alert, oriented, appears well and in no acute distress  HEENT: atraumatic, conjunttiva clear, no obvious abnormalities on inspection of external nose and ears  NECK: normal movements of the head and neck  LUNGS: on inspection no signs of respiratory distress, breathing rate appears normal, no obvious gross SOB, gasping or wheezing  CV: no obvious cyanosis  SKIN: see images from ER visit  PSYCH/NEURO: pleasant and  cooperative, no obvious depression or anxiety, speech and thought processing grossly intact  ASSESSMENT AND PLAN:  Discussed the following assessment and plan:  Dog bite, subsequent encounter Laceration without foreign body, left thigh -we discussed possible serious and likely etiologies, options for evaluation and workup, limitations of telemedicine visit vs in person visit, treatment, treatment risks and precautions. Discussed at length. She is quite concerned about deeper infection given low grade temps, severe and worsening pain and foul smelling drainage. She reports worsening symptoms and uncontrolled pain despite Augmentin, meloxicam and hydrocodone. Advised inperson eval/management for possible imaging, labs, possible surgical management and pain control. She has opted to go to [facility] today for this. Declines transport. Feels safe going by private car. She plans to go to [facility] - ER triage notified of this patient and the case. She requested work note for today. Provided  below   I discussed the assessment and treatment plan with the patient. The patient was provided an opportunity to ask questions and all were answered. The patient agreed with the plan and demonstrated an understanding of the instructions.   The patient was advised to call back or seek an in-person evaluation if the symptoms worsen or if the condition fails to improve as anticipated.   Lucretia Kern, DO

## 2019-10-27 ENCOUNTER — Encounter: Payer: Self-pay | Admitting: Internal Medicine

## 2019-10-29 ENCOUNTER — Other Ambulatory Visit: Payer: Self-pay | Admitting: Internal Medicine

## 2019-10-29 DIAGNOSIS — R053 Chronic cough: Secondary | ICD-10-CM

## 2019-10-29 DIAGNOSIS — R05 Cough: Secondary | ICD-10-CM

## 2019-10-29 NOTE — Telephone Encounter (Signed)
Per Dr Ardyth Harps- patient should not get any more chlorpheniramine-HYDROcodone (TUSSIONEX  Because she has cancelled her appointment for the respiratory clinic more than once, and has not rescheduled.  Patient is also having surgery this week.  Can you please deny this Rx request?

## 2019-10-29 NOTE — Telephone Encounter (Signed)
Medication refill denied.  

## 2019-11-01 MED FILL — BUTALB-ACETAMIN-CAFF 50-325: 50-325-40 | 3 days supply | Qty: 10 | Fill #2

## 2019-11-08 MED FILL — GABAPENTIN 300 MG CAPSULE: 300 | 20 days supply | Qty: 180 | Fill #1

## 2019-11-09 MED FILL — BUTALB-ACETAMIN-CAFF 50-325: 50-325-40 | 3 days supply | Qty: 10 | Fill #3

## 2019-11-18 MED FILL — DIAZEPAM 10 MG TABS: 10 | 30 days supply | Qty: 90 | Fill #0

## 2019-11-19 NOTE — Progress Notes (Deleted)
Virtual Visit via Video Note The purpose of this virtual visit is to provide medical care while limiting exposure to the novel coronavirus.    Consent was obtained for video visit:  Yes.   Answered questions that patient had about telehealth interaction:  Yes.   I discussed the limitations, risks, security and privacy concerns of performing an evaluation and management service by telemedicine. I also discussed with the patient that there may be a patient responsible charge related to this service. The patient expressed understanding and agreed to proceed.  Pt location: Home Physician Location: office Name of referring provider:  Philip Aspen, Almira Bar* I connected with Sabrina [name] at patients initiation/request on 11/20/2019 at 11:10 AM EST by video enabled telemedicine application and verified that I am speaking with the correct person using two identifiers. Pt MRN:  [id number] Pt DOB:  08/22/1966 Video Participants:  Sabrina [name]   History of Present Illness:  Sabrina [name] is a 54 year old Caucasian woman with bullous pemphigoid and anxiety with panic disorder who follows up for migraines and tension-type headaches.  UPDATE: In September, venlafaxine XR was increased to 75mg  daily. Intensity:  8/10 Duration:  1 day Frequency:  2 days a week Frequency of abortive medication: 2 days a week Current NSAIDS:none Current analgesics:Fioricet Current triptans:none Current ergotamine:none Current anti-emetic:promethazine 25mg  Current muscle relaxants:none Current anti-anxiolytic:Valium Current sleep aide:none Current Antihypertensive medications:none Current Antidepressant medications:Venlafaxine XR 75mg  daily Current Anticonvulsant medications:Gabapentin 600mg  three times daily Current anti-CGRP:none Current Vitamins/Herbal/Supplements:D Current Antihistamines/Decongestants:none Other therapy:none Hormone/birth control:none  Caffeine:No  coffee or colas Diet:Hydrates with water. Smooties Exercise:Tries to walk but has trouble with her knees. Likes to ride her horse but first needs knee surgery Depression/Anxiety:Significant anxiety with panic attacks. Increased anxiety as father recently had a stroke in and she was unable to visit him due to COVID-19. She also has been struggling Stress related to increased pain due to thebullous pemphigoid Other pain:pain due to thebullous pemphigoid. Bilateral knee pain Sleep hygiene:poor  HISTORY: Onset:  Her late-20s Location:Over left eyebrow Quality:Stabbing, pounding, pressure Initial intensity:8-9/10.Shedenies new headache, thunderclap headache Aura:No Premonitory Phase:No Postdrome:no Associated symptoms:Nausea, vomiting.Shedenies associated photophobia, phonophobia,unilateral numbness or weakness. Initial Duration:Usually 1 day. Longest lasted 3 days. Initial Frequency:3 to 4 days a month but more frequently lately due to increased stress. Initial Frequency of abortive medication:Fioricet 1-2 days a week. Excedrin most days a week (for tension-type headache) Triggers:  Emotional stress Relieving factors:Laying down Activity:Aggravated when sitting up or standing upright She also has tension type headaches which are mild to moderate bifrontal pressure, almost daily  Past NSAIDS:naproxen Past analgesics:tramadol, Excedrin, Goody powder, Tylenol Past abortive triptans:rizatriptan 10mg  Past abortive ergotamine:none Past muscle relaxants:none Past anti-emetic:Compazine 10mg , Phenergan, Zofran (allergy) Past antihypertensive medications:none Past antidepressant medications:Lexapro, Paxil, Zoloft Past anticonvulsant medications:Topiramate 25mg  twice daily(on for one month) Past anti-CGRP:none Past vitamins/Herbal/Supplements:none Past antihistamines/decongestants:none Other past  therapies:none  Family history of headache:Father (migraines)  Past Medical History: Past Medical History:  Diagnosis Date  . Arthritis    "knees" (09/06/2017)  . Bullous pemphigus   . Cat bite 06/2014   to left elbow  . History of blood transfusion 1988   "when I had my baby"  . Muscle weakness of lower extremity 2001; 09/05/2017   "resolved after a couple weeks; ?" (09/06/2017)  . 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 Poisoning, snake bite 04/08/2016   "[place]; RUE"  . PONV (postoperative nausea and vomiting)   . S/P right knee surgery   .  Situational anxiety   . Staph infection ~ 2015   "left elbow and finger"    Medications: Outpatient Encounter Medications as of 11/20/2019  Medication Sig  . butalbital-acetaminophen-caffeine (FIORICET) 50-325-40 MG tablet TAKE 1 TABLET BY MOUTH EVERY 6 HOURS AS NEEDED FOR HEADACHE (Patient taking differently: Take 1 tablet by mouth every 6 (six) hours as needed for headache. )  . chlorpheniramine-HYDROcodone (TUSSIONEX PENNKINETIC ER) 10-8 MG/5ML SUER Take 5 mLs by mouth at bedtime as needed for cough.  . diazepam (VALIUM) 10 MG tablet Take 1 tablet (10 mg) twice a day for one week then, IF NEEDED, increase to 1 tablet (10 mg) in the morning and 2 tablets (20 mg) at bedtime (Patient taking differently: Take 5 mg by mouth 3 (three) times daily as needed for anxiety. )  . fluticasone (FLOVENT HFA) 44 MCG/ACT inhaler Inhale 2 puffs into the lungs 2 (two) times daily.  Marland Kitchen gabapentin (NEURONTIN) 300 MG capsule Take 2 capsules (600 mg total) by mouth 3 (three) times daily.  Marland Kitchen HYDROcodone-acetaminophen (NORCO/VICODIN) 5-325 MG tablet Take 1-2 tablets by mouth every 6 (six) hours as needed for moderate pain or severe pain.  Marland Kitchen ipratropium (ATROVENT) 0.06 % nasal spray Place 2 sprays into both nostrils 4 (four) times daily.  . meloxicam (MOBIC) 15 MG tablet Take 1 tablet (15 mg total) by mouth daily.  . sodium chloride (OCEAN) 0.65 % SOLN nasal  spray Place 1 spray into both nostrils as needed for congestion.  . traMADol (ULTRAM) 50 MG tablet Take 1 tablet (50 mg total) by mouth every 6 (six) hours as needed. (Patient not taking: Reported on 10/22/2019)  . venlafaxine XR (EFFEXOR-XR) 75 MG 24 hr capsule 1  qam (Patient taking differently: Take 75 mg by mouth daily with breakfast. )   No facility-administered encounter medications on file as of 11/20/2019.    Allergies: Allergies  Allergen Reactions  . Zofran [Ondansetron Hcl] Hives and Other (See Comments)    Spots around iv site  . Droperidol Palpitations  . Flexeril [Cyclobenzaprine] Anxiety  . Morphine And Related Itching  . Tessalon [Benzonatate] Other (See Comments)    Watery eyes  . Voltaren [Diclofenac Sodium] Rash    Family History: Family History  Problem Relation Age of Onset  . Liver disease Mother   . Dementia Mother   . Cirrhosis Mother   . Prostate cancer Father   . Colon cancer Maternal Grandmother   . Ovarian cancer Maternal Aunt     Social History: Social History   Socioeconomic History  . Marital status: Divorced    Spouse name: Not on file  . Number of children: 1  . Years of education: Not on file  . Highest education level: Associate degree: academic program  Occupational History  . Occupation: unemployed  Tobacco Use  . Smoking status: Never Smoker  . Smokeless tobacco: Never Used  Substance and Sexual Activity  . Alcohol use: Never    Alcohol/week: 0.0 standard drinks    Comment: social  . Drug use: No  . Sexual activity: Not Currently  Other Topics Concern  . Not on file  Social History Narrative   Pateint is right-handed. She lives alone in a single level home. She rarely drinks caffeine. She is limited to exercise due to knee injuries.   Social Determinants of Health   Financial Resource Strain:   . Difficulty of Paying Living Expenses: Not on file  Food Insecurity:   . Worried About Programme researcher, broadcasting/film/video in  the Last Year: Not  on file  . Ran Out of Food in the Last Year: Not on file  Transportation Needs:   . Lack of Transportation (Medical): Not on file  . Lack of Transportation (Non-Medical): Not on file  Physical Activity:   . Days of Exercise per Week: Not on file  . Minutes of Exercise per Session: Not on file  Stress:   . Feeling of Stress : Not on file  Social Connections:   . Frequency of Communication with Friends and Family: Not on file  . Frequency of Social Gatherings with Friends and Family: Not on file  . Attends Religious Services: Not on file  . Active Member of Clubs or Organizations: Not on file  . Attends Archivist Meetings: Not on file  . Marital Status: Not on file  Intimate Partner Violence:   . Fear of Current or Ex-Partner: Not on file  . Emotionally Abused: Not on file  . Physically Abused: Not on file  . Sexually Abused: Not on file    Observations/Objective:   *** No acute distress.  Alert and oriented.  Speech fluent and not dysarthric.  Language intact.  Eyes orthophoric on primary gaze.  Face symmetric.  Assessment and Plan:   1.  Migraine without aura, without status migrainosus, not intractable 2.  Tension-type headache, not intractable  1.  For preventative management, *** 2.  For abortive therapy, Fioricet 3.  Limit use of pain relievers to no more than 2 days out of week to prevent risk of rebound or medication-overuse headache. 4.  Keep headache diary 5.  Exercise, hydration, caffeine cessation, sleep hygiene, monitor for and avoid triggers 6. Follow up ***   Follow Up Instructions:    -I discussed the assessment and treatment plan with the patient. The patient was provided an opportunity to ask questions and all were answered. The patient agreed with the plan and demonstrated an understanding of the instructions.   The patient was advised to call back or seek an in-person evaluation if the symptoms worsen or if the condition fails to improve as  anticipated.    Dudley Major, DO

## 2019-11-20 ENCOUNTER — Telehealth: Payer: Self-pay | Admitting: Neurology

## 2019-12-01 MED FILL — GABAPENTIN 300 MG CAPSULE: 300 | 20 days supply | Qty: 180 | Fill #2

## 2019-12-04 ENCOUNTER — Telehealth: Payer: Self-pay | Admitting: Internal Medicine

## 2019-12-04 NOTE — Telephone Encounter (Signed)
Pt called in stating that she will not be available to come in on 12/05/2019 for her CPE due to her having to work.  Pt would like to have her CPE sometime this month so that she is able to get her medications.  Can we rescheduled sometime on Tuesday, Wednesday or Friday early morning or late afternoon?

## 2019-12-05 ENCOUNTER — Encounter: Payer: Self-pay | Admitting: Internal Medicine

## 2019-12-05 NOTE — Telephone Encounter (Signed)
Spoke with patient and she will call back to schedule the following:  Virtual visit to follow up/refills  CPE

## 2019-12-16 ENCOUNTER — Other Ambulatory Visit: Payer: Self-pay | Admitting: Neurology

## 2019-12-16 MED FILL — DIAZEPAM 10 MG TABS: 10 | 30 days supply | Qty: 90 | Fill #0

## 2019-12-16 MED FILL — BUTALB-ACETAMIN-CAFF 50-325: 50-325-40 | 3 days supply | Qty: 10 | Fill #0

## 2019-12-20 ENCOUNTER — Encounter: Payer: Self-pay | Admitting: Family Medicine

## 2019-12-20 ENCOUNTER — Telehealth (INDEPENDENT_AMBULATORY_CARE_PROVIDER_SITE_OTHER): Payer: Self-pay | Admitting: Family Medicine

## 2019-12-20 ENCOUNTER — Encounter: Payer: Self-pay | Admitting: Internal Medicine

## 2019-12-20 VITALS — Temp 100.2°F

## 2019-12-20 DIAGNOSIS — Z7189 Other specified counseling: Secondary | ICD-10-CM

## 2019-12-20 DIAGNOSIS — J069 Acute upper respiratory infection, unspecified: Secondary | ICD-10-CM

## 2019-12-20 MED ORDER — HYDROCOD POLST-CPM POLST ER 10-8 MG/5ML PO SUER: 5.0000 mL | Freq: Every evening | ORAL | 0 refills | Status: DC | PRN

## 2019-12-20 NOTE — Progress Notes (Signed)
Virtual Visit via Video Note  I connected with Sabrina [name] on 12/20/19 at  2:30 PM EDT by a video enabled telemedicine application 2/2 COVID-19 pandemic and verified that I am speaking with the correct person using two identifiers.  Location patient: home Location provider:work or home office Persons participating in the virtual visit: patient, provider  I discussed the limitations of evaluation and management by telemedicine and the availability of in person appointments. The patient expressed understanding and agreed to proceed.   HPI: Pt is a 54 yo female with pmh sig for OA, h/o depression, HAs, anxiety followed by Dr. Ardyth Harps.  Pt with a cough since last night.  States was up last night coughing.  Temp 100.1 F this am at work so she was sent home.   Also endorses chills, sore throat, scratchy voice, rhinorrhea. Denies HAs, ear pain or pressure, post nasal drainage. Pt is a surgery nurse at a vet clinic.  States she has a h/o bronchitis about 2x per yr.  Used an inhaler she had from last yr, but it made her jittery.  She tried an old rx for tussionex which did not help and mucinex.   Pt notes allergy to tessalon-causes watery eyes and conjunctivitis.    Pt had covid in Oct.  ROS: See pertinent positives and negatives per HPI.  Past Medical History:  Diagnosis Date  . Arthritis    "knees" (09/06/2017)  . Bullous pemphigus   . Cat bite 06/2014   to left elbow  . History of blood transfusion 1988   "when I had my baby"  . Muscle weakness of lower extremity 2001; 09/05/2017   "resolved after a couple weeks; ?" (09/06/2017)  . 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 Poisoning, snake bite 04/08/2016   "[place]; RUE"  . PONV (postoperative nausea and vomiting)   . S/P right knee surgery   . Situational anxiety   . Staph infection ~ 2015   "left elbow and finger"    Past Surgical History:  Procedure Laterality Date  . APPENDECTOMY  ~ 1987  . APPLICATION OF A-CELL OF EXTREMITY Left  08/05/2015   Procedure: APPLICATION OF A-CELL OF EXTREMITY;  Surgeon: Peggye Form, DO;  Location: [facility];  Service: Plastics;  Laterality: Left;  . BREAST SURGERY Right 1990   "milk duct taken out"  . CHONDROPLASTY Right 02/19/2019   Procedure: CHONDROPLASTY; EXCISION EXOSTOSIS;  Surgeon: Valeria Batman, MD;  Location: [facility];  Service: Orthopedics;  Laterality: Right;  . DEBRIDEMENT AND CLOSURE WOUND Left 07/01/2015   Procedure: LEFT ELBOW EXCISION OF WOUND WITH PRIMARY CLOSURE 2X5 CM ;  Surgeon: Peggye Form, DO;  Location: [facility];  Service: Plastics;  Laterality: Left;  . ELBOW SURGERY Left X 23 in [place] <06/2015   from a cat bite; all I&D  . I & D EXTREMITY Left 07/08/2015   Procedure: IRRIGATION AND DEBRIDEMENT EXTREMITY, DRAINAGE OF LEFT ARM WOUND, A-CELL PLACEMENT, WOUND VAC PLACEMENT;  Surgeon: Alena Bills Dillingham, DO;  Location: [facility];  Service: Plastics;  Laterality: Left;  . INCISION AND DRAINAGE OF WOUND Left 08/05/2015   Procedure: IRRIGATION AND DEBRIDEMENT LEFT ELBOW WOUND, PLACEMENT OF ACELL;  Surgeon: Peggye Form, DO;  Location:  SURGERY CENTER;  Service: Plastics;  Laterality: Left;  . KNEE ARTHROSCOPY WITH MEDIAL MENISECTOMY Right 02/19/2019   Procedure: RIGHT KNEE ARTHROSCOPY, DEBRIDEMENT, PARTIAL MEDIAL AND LATERAL MENISECTOMY;  Surgeon: Valeria Batman, MD;  Location: [facility];  Service: Orthopedics;  Laterality: Right;  . LAPAROSCOPIC CHOLECYSTECTOMY  1998  . SKIN GRAFT Left 2016   took from anterior thigh; placed at elbow  . TONSILLECTOMY  ~ 2000  . TOTAL ABDOMINAL HYSTERECTOMY  2003  . WRIST SURGERY Right 01/2016    Family History  Problem Relation Age of Onset  . Liver disease Mother   . Dementia Mother   . Cirrhosis Mother   . Prostate cancer Father   . Colon cancer Maternal Grandmother   . Ovarian cancer Maternal Aunt      Current Outpatient  Medications:  .  butalbital-acetaminophen-caffeine (FIORICET) 50-325-40 MG tablet, TAKE 1 TABLET BY MOUTH EVERY 6 HOURS AS NEEDED FOR HEADACHE, Disp: 10 tablet, Rfl: 3 .  chlorpheniramine-HYDROcodone (TUSSIONEX PENNKINETIC ER) 10-8 MG/5ML SUER, Take 5 mLs by mouth at bedtime as needed for cough., Disp: 140 mL, Rfl: 0 .  diazepam (VALIUM) 10 MG tablet, Take 1 tablet (10 mg) twice a day for one week then, IF NEEDED, increase to 1 tablet (10 mg) in the morning and 2 tablets (20 mg) at bedtime (Patient taking differently: Take 5 mg by mouth 3 (three) times daily as needed for anxiety. ), Disp: 90 tablet, Rfl: 4 .  fluticasone (FLOVENT HFA) 44 MCG/ACT inhaler, Inhale 2 puffs into the lungs 2 (two) times daily., Disp: 1 Inhaler, Rfl: 0 .  gabapentin (NEURONTIN) 300 MG capsule, Take 2 capsules (600 mg total) by mouth 3 (three) times daily., Disp: 180 capsule, Rfl: 0 .  HYDROcodone-acetaminophen (NORCO/VICODIN) 5-325 MG tablet, Take 1-2 tablets by mouth every 6 (six) hours as needed for moderate pain or severe pain., Disp: 20 tablet, Rfl: 0 .  ipratropium (ATROVENT) 0.06 % nasal spray, Place 2 sprays into both nostrils 4 (four) times daily., Disp: 15 mL, Rfl: 0 .  meloxicam (MOBIC) 15 MG tablet, Take 1 tablet (15 mg total) by mouth daily., Disp: 30 tablet, Rfl: 0 .  sodium chloride (OCEAN) 0.65 % SOLN nasal spray, Place 1 spray into both nostrils as needed for congestion., Disp: 60 mL, Rfl: 0 .  traMADol (ULTRAM) 50 MG tablet, Take 1 tablet (50 mg total) by mouth every 6 (six) hours as needed., Disp: 15 tablet, Rfl: 0 .  venlafaxine XR (EFFEXOR-XR) 75 MG 24 hr capsule, 1  qam (Patient taking differently: Take 75 mg by mouth daily with breakfast. ), Disp: 90 capsule, Rfl: 1  EXAM:  VITALS per patient if applicable:  GENERAL: alert, oriented, appears well and in no acute distress  HEENT: atraumatic, conjunctiva clear, no obvious abnormalities on inspection of external nose and ears  NECK: normal movements  of the head and neck  LUNGS: on inspection no signs of respiratory distress, breathing rate appears normal, no obvious gross SOB, gasping or wheezing  CV: no obvious cyanosis  MS: moves all visible extremities without noticeable abnormality  PSYCH/NEURO: pleasant and cooperative, no obvious depression or anxiety, speech and thought processing grossly intact  ASSESSMENT AND PLAN:  Discussed the following assessment and plan:  Viral URI with cough -given 1 day of symptoms likely viral in nature.  Must consider acute nasopharyngitis, allergies, influenza, COVID -supportive care advised. -pt encouraged to get a COVID test. -allergy to tessalon.  Able to take tussionex without issue despite codeine causing pruritis. -given precautions  - Plan: chlorpheniramine-HYDROcodone (TUSSIONEX PENNKINETIC ER) 10-8 MG/5ML SUER  Educated about COVID-19 virus infection -discussed s/s  -advised possible to have COVID again -COVID testing encouraged -self quarantine  F/u prn  I discussed the assessment and treatment plan with the patient. The patient was provided an opportunity to ask questions and all were answered. The patient agreed with the plan and demonstrated an understanding of the instructions.   The patient was advised to call back or seek an in-person evaluation if the symptoms worsen or if the condition fails to improve as anticipated.  Billie Ruddy, MD

## 2019-12-25 ENCOUNTER — Encounter: Payer: Self-pay | Admitting: Internal Medicine

## 2019-12-25 ENCOUNTER — Other Ambulatory Visit: Payer: Self-pay

## 2019-12-25 ENCOUNTER — Ambulatory Visit (INDEPENDENT_AMBULATORY_CARE_PROVIDER_SITE_OTHER): Payer: Self-pay | Admitting: Psychiatry

## 2019-12-25 ENCOUNTER — Telehealth (INDEPENDENT_AMBULATORY_CARE_PROVIDER_SITE_OTHER): Payer: Self-pay | Admitting: Internal Medicine

## 2019-12-25 ENCOUNTER — Encounter (HOSPITAL_COMMUNITY): Payer: Self-pay | Admitting: Psychiatry

## 2019-12-25 ENCOUNTER — Telehealth: Payer: Self-pay | Admitting: Internal Medicine

## 2019-12-25 VITALS — Wt 174.0 lb

## 2019-12-25 DIAGNOSIS — M25562 Pain in left knee: Secondary | ICD-10-CM

## 2019-12-25 DIAGNOSIS — G629 Polyneuropathy, unspecified: Secondary | ICD-10-CM

## 2019-12-25 DIAGNOSIS — F410 Panic disorder [episodic paroxysmal anxiety] without agoraphobia: Secondary | ICD-10-CM

## 2019-12-25 MED ORDER — GABAPENTIN 300 MG PO CAPS: 600.0000 mg | ORAL_CAPSULE | Freq: Three times a day (TID) | ORAL | 2 refills | Status: DC

## 2019-12-25 MED ORDER — VENLAFAXINE HCL ER 75 MG PO CP24: ORAL_CAPSULE | ORAL | 1 refills | Status: DC

## 2019-12-25 MED ORDER — DIAZEPAM 10 MG PO TABS: ORAL_TABLET | ORAL | 4 refills | Status: DC

## 2019-12-25 MED FILL — GABAPENTIN 300 MG CAPSULE: 300 | 30 days supply | Qty: 180 | Fill #0

## 2019-12-25 MED FILL — VENLAFAXINE HCL ER 75 MG CA: 75 | 90 days supply | Qty: 90 | Fill #0

## 2019-12-25 NOTE — Progress Notes (Signed)
BH MD/PA/NP OP Progress Note  12/25/2019 3:14 PM Sabrina [name]  MRN:  [id number]  Chief Complaint: Panic disorder without agoraphobia Today the patient once again is doing actually very well.  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  She is very busy and active.  She likes what she does, likes the people she works with and likes her ball.  She gets paid very well.  She is yet to move from [employer] [place] to [place] but she will do that soon.  The hospital was in [place].  The patient denies significant panic attacks.  Her mood is great.  She is sleeping very well and eating well.  She works very hard.  On the weekends she likes watching for phrases.  She has a new dog which she walks all the time.  She is enjoying life well.  She still has some knee problems but she seems to be getting by pretty well with it.  He does not get in the way of her life.  Her health generally is pretty good.  Her skin condition which was deemed to be stimulated by emotional stress is calm at this time.  She has no skin lesions.  Financially she is doing well.  At this time she has no romance.  The patient keeps busy watching horse races reading and spending time with her dog.  Patient has no evidence of psychosis.  She has no irritability.  She drinks no alcohol and uses no drugs.  She takes all her medicines just as prescribed.  She has no fatigue.  She is very compliant.  At this time I do not believe she is in therapy.  Past Psychiatric History: See intake H&P for full details. Reviewed, with no updates at this time.   Past Medical History:  Past Medical History:  Diagnosis Date  . Arthritis    "knees" (09/06/2017)  . Bullous pemphigus   . Cat bite 06/2014   to left elbow  . History of blood transfusion 1988   "when I had my baby"  . Muscle weakness of lower extremity 2001; 09/05/2017   "resolved after a couple weeks; ?" (09/06/2017)  . 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[place])    . Poisoning, snake bite 04/08/2016   "[place]; RUE"  . PONV (postoperative nausea and vomiting)   . S/P right knee surgery   . Situational anxiety   . Staph infection ~ 2015   "left elbow and finger"    Past Surgical History:  Procedure Laterality Date  . APPENDECTOMY  ~ 1987  . APPLICATION OF A-CELL OF EXTREMITY Left 08/05/2015   Procedure: APPLICATION OF A-CELL OF EXTREMITY;  Surgeon: Wallace Going, DO;  Location: [facility];  Service: Plastics;  Laterality: Left;  . BREAST SURGERY Right 1990   "milk duct taken out"  . CHONDROPLASTY Right 02/19/2019   Procedure: CHONDROPLASTY; EXCISION EXOSTOSIS;  Surgeon: Garald Balding, MD;  Location: [facility];  Service: Orthopedics;  Laterality: Right;  . DEBRIDEMENT AND CLOSURE WOUND Left 07/01/2015   Procedure: LEFT ELBOW EXCISION OF WOUND WITH PRIMARY CLOSURE 2X5 CM ;  Surgeon: Wallace Going, DO;  Location: [facility];  Service: Plastics;  Laterality: Left;  . ELBOW SURGERY Left X 23 in [place] <06/2015   from a cat bite; all I&D  . I & D EXTREMITY Left 07/08/2015   Procedure: IRRIGATION AND DEBRIDEMENT EXTREMITY, DRAINAGE OF LEFT ARM WOUND,  A-CELL PLACEMENT, WOUND VAC PLACEMENT;  Surgeon: Alena Bills Dillingham, DO;  Location: [facility];  Service: Plastics;  Laterality: Left;  . INCISION AND DRAINAGE OF WOUND Left 08/05/2015   Procedure: IRRIGATION AND DEBRIDEMENT LEFT ELBOW WOUND, PLACEMENT OF ACELL;  Surgeon: Peggye Form, DO;  Location: [facility];  Service: Plastics;  Laterality: Left;  . KNEE ARTHROSCOPY WITH MEDIAL MENISECTOMY Right 02/19/2019   Procedure: RIGHT KNEE ARTHROSCOPY, DEBRIDEMENT, PARTIAL MEDIAL AND LATERAL MENISECTOMY;  Surgeon: Valeria Batman, MD;  Location: [facility];  Service: Orthopedics;  Laterality: Right;  . LAPAROSCOPIC CHOLECYSTECTOMY  1998  . SKIN GRAFT Left 2016   took from anterior thigh; placed at elbow  .  TONSILLECTOMY  ~ 2000  . TOTAL ABDOMINAL HYSTERECTOMY  2003  . WRIST SURGERY Right 01/2016    Family Psychiatric History: See intake H&P for full details. Reviewed, with no updates at this time.   Family History:  Family History  Problem Relation Age of Onset  . Liver disease Mother   . Dementia Mother   . Cirrhosis Mother   . Prostate cancer Father   . Colon cancer Maternal Grandmother   . Ovarian cancer Maternal Aunt     Social History:  Social History   Socioeconomic History  . Marital status: Divorced    Spouse name: Not on file  . Number of children: 1  . Years of education: Not on file  . Highest education level: Associate degree: academic program  Occupational History  . Occupation: unemployed  Tobacco Use  . Smoking status: Never Smoker  . Smokeless tobacco: Never Used  Substance and Sexual Activity  . Alcohol use: Never    Alcohol/week: 0.0 standard drinks    Comment: social  . Drug use: No  . Sexual activity: Not Currently  Other Topics Concern  . Not on file  Social History Narrative   Pateint is right-handed. She lives alone in a single level home. She rarely drinks caffeine. She is limited to exercise due to knee injuries.   Social Determinants of Health   Financial Resource Strain:   . Difficulty of Paying Living Expenses:   Food Insecurity:   . Worried About Programme researcher, broadcasting/film/video in the Last Year:   . Barista in the Last Year:   Transportation Needs:   . Freight forwarder (Medical):   Marland Kitchen Lack of Transportation (Non-Medical):   Physical Activity:   . Days of Exercise per Week:   . Minutes of Exercise per Session:   Stress:   . Feeling of Stress :   Social Connections:   . Frequency of Communication with Friends and Family:   . Frequency of Social Gatherings with Friends and Family:   . Attends Religious Services:   . Active Member of Clubs or Organizations:   . Attends Banker Meetings:   Marland Kitchen Marital Status:      Allergies:  Allergies  Allergen Reactions  . Zofran [Ondansetron Hcl] Hives and Other (See Comments)    Spots around iv site  . Droperidol Palpitations  . Flexeril [Cyclobenzaprine] Anxiety  . Morphine And Related Itching  . Tessalon [Benzonatate] Other (See Comments)    Conjunctivitis, watery eyes.  . Voltaren [Diclofenac Sodium] Rash    Metabolic Disorder Labs: Lab Results  Component Value Date   HGBA1C 5.2 06/18/2018   MPG  07/05/2015    QUESTIONABLE IDENTIFICATION / INCORRECTLY LABELED SPECIMEN   No results found for: PROLACTIN Lab Results  Component Value Date   CHOL 209 (H) 06/18/2018   TRIG 71 06/18/2018   HDL 73 06/18/2018   CHOLHDL 2.9 06/18/2018   VLDL 29 04/27/2015   LDLCALC 122 (H) 06/18/2018   LDLCALC 138 (H) 04/27/2015   Lab Results  Component Value Date   TSH 2.890 06/18/2018   TSH  07/05/2015    QUESTIONABLE IDENTIFICATION / INCORRECTLY LABELED SPECIMEN    Therapeutic Level Labs: No results found for: LITHIUM No results found for: VALPROATE No components found for:  CBMZ  Current Medications: Current Outpatient Medications  Medication Sig Dispense Refill  . butalbital-acetaminophen-caffeine (FIORICET) 50-325-40 MG tablet TAKE 1 TABLET BY MOUTH EVERY 6 HOURS AS NEEDED FOR HEADACHE 10 tablet 3  . chlorpheniramine-HYDROcodone (TUSSIONEX PENNKINETIC ER) 10-8 MG/5ML SUER Take 5 mLs by mouth at bedtime as needed for cough. (Patient not taking: Reported on 12/25/2019) 140 mL 0  . diazepam (VALIUM) 10 MG tablet 1  qam   2  qhs 90 tablet 4  . gabapentin (NEURONTIN) 300 MG capsule Take 2 capsules (600 mg total) by mouth 3 (three) times daily. 180 capsule 0  . venlafaxine XR (EFFEXOR-XR) 75 MG 24 hr capsule 1  qam 90 capsule 1   No current facility-administered medications for this visit.     Musculoskeletal: Strength & Muscle Tone: within normal limits Gait & Station: normal Patient leans: N/A  Psychiatric Specialty Exam: ROS  There were no  vitals taken for this visit.There is no height or weight on file to calculate BMI.  General Appearance: Casual and Well Groomed  Eye Contact:  Good  Speech:  Clear and Coherent  Volume:  Normal  Mood:  Euthymic  Affect:  Congruent  Thought Process:  Goal Directed and Descriptions of Associations: Intact  Orientation:  Full (Time, Place, and Person)  Thought Content: Logical   Suicidal Thoughts:  No  Homicidal Thoughts:  No  Memory:  Immediate;   Fair  Judgement:  Fair  Insight:  Fair  Psychomotor Activity:  Normal  Concentration:  Concentration: Good  Recall:  Good  Fund of Knowledge: Good  Language: Good  Akathisia:  Negative  Handed:  Right  AIMS (if indicated): not done  Assets:  Communication Skills Desire for Improvement Housing Transportation  ADL's:  Intact  Cognition: WNL  Sleep:  Fair   Screenings: PHQ2-9     Video Visit from 12/25/2019 in [facility] at [employer] from 09/21/2018 in [facility] Office Visit from 06/25/2018 in [facility] Office Visit from 06/18/2018 in [facility] Office Visit from 08/28/2017 in [facility]  PHQ-2 Total Score  0  1  0  0  0  PHQ-9 Total Score  2  --  --  --  --       Assessment and Plan:   This patient has 2 problems.  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  For this she takes Effexor 75 mg every morning.  Her second problem is that of panic disorder.  Patient takes Valium 10 mg once in the morning and 2 at night.  She is not oversedated.  It does not affect her ability to think or concentrate.  She is functioning extremely well.  This patient return to see [place] in 4 months.  Status of current problems: gradually improving  Labs Ordered: No orders of the defined types were placed in this encounter.   Labs Reviewed: n/a  Collateral Obtained/Records Reviewed: n/a  Plan:  Continue Lexapro 20 mg  daily Increase Seroquel to 200 mg nightly Return to clinic in 3-4 months, transfer care to Dr. Liz Beach, MD 12/25/2019, 3:14 PM

## 2019-12-25 NOTE — Progress Notes (Signed)
Virtual Visit via Video Note  I connected with Sabrina [name] on 12/25/19 at  3:30 PM EDT by a video enabled telemedicine application and verified that I am speaking with the correct person using two identifiers.  Location patient: home Location provider: work office Persons participating in the virtual visit: patient, provider  I discussed the limitations of evaluation and management by telemedicine and the availability of in person appointments. The patient expressed understanding and agreed to proceed.   HPI: She has scheduled this acute for a few reasons:  1. Needs gabapentin refills that she takes for her neuropathy.  2. She has been having a cough that goes back a few months. We have attempted to arrange f/u multiple times for her at the respiratory clinic but she has no showed or canceled to all of these. She has asked several providers in this clinic for Tussionex syrup, her most recent prescription being on 4/9. 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 States cough is much better and she only had to take the tussionex for 2 days.  3. She states she injured her knee again last night while chasing her dog that was lost in the woods behind her house. It is "black and blue". She no longer calls her orthopedist when she has pain because he has already told her she needs a knee replacement (which she is not ready for) and other than that he has nothing further to offer. She wonders what else can be prescribed for her.   ROS: Constitutional: Denies fever, chills, diaphoresis, appetite change and fatigue.  HEENT: Denies photophobia, eye pain, redness, hearing loss, ear pain, congestion, sore throat, rhinorrhea, sneezing, mouth sores, trouble swallowing, neck pain, neck stiffness and tinnitus.   Respiratory: Denies SOB, DOE, cough, chest tightness,  and wheezing.   Cardiovascular: Denies chest pain, palpitations and leg swelling.    Gastrointestinal: Denies nausea, vomiting, abdominal pain, diarrhea, constipation, blood in stool and abdominal distention.  Genitourinary: Denies dysuria, urgency, frequency, hematuria, flank pain and difficulty urinating.  Endocrine: Denies: hot or cold intolerance, sweats, changes in hair or nails, polyuria, polydipsia. Musculoskeletal: Denies myalgias, back pain, and gait problem.  Skin: Denies pallor, rash and wound.  Neurological: Denies dizziness, seizures, syncope, weakness, light-headedness, numbness and headaches.  Hematological: Denies adenopathy. Easy bruising, personal or family bleeding history  Psychiatric/Behavioral: Denies suicidal ideation, mood changes, confusion, nervousness, sleep disturbance and agitation   Past Medical History:  Diagnosis Date  . Arthritis    "knees" (09/06/2017)  . Bullous pemphigus   . Cat bite 06/2014   to left elbow  . History of blood transfusion 1988   "when I had my baby"  . Muscle weakness of lower extremity 2001; 09/05/2017   "resolved after a couple weeks; ?" (09/06/2017)  . 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 Poisoning, snake bite 04/08/2016   "[place]; RUE"  . PONV (postoperative nausea and vomiting)   . S/P right knee surgery   . Situational anxiety   . Staph infection ~ 2015   "left elbow and finger"    Past Surgical History:  Procedure Laterality Date  . APPENDECTOMY  ~ 1987  . APPLICATION OF A-CELL OF EXTREMITY Left 08/05/2015   Procedure: APPLICATION OF A-CELL OF EXTREMITY;  Surgeon: Wallace Going, DO;  Location: [facility];  Service: Plastics;  Laterality: Left;  . BREAST SURGERY Right 1990   "milk duct  taken out"  . CHONDROPLASTY Right 02/19/2019   Procedure: CHONDROPLASTY; EXCISION EXOSTOSIS;  Surgeon: Valeria Batman, MD;  Location: [facility];  Service: Orthopedics;  Laterality: Right;  . DEBRIDEMENT AND CLOSURE WOUND Left 07/01/2015   Procedure: LEFT ELBOW EXCISION OF WOUND  WITH PRIMARY CLOSURE 2X5 CM ;  Surgeon: Peggye Form, DO;  Location: [facility];  Service: Plastics;  Laterality: Left;  . ELBOW SURGERY Left X 23 in [place] <06/2015   from a cat bite; all I&D  . I & D EXTREMITY Left 07/08/2015   Procedure: IRRIGATION AND DEBRIDEMENT EXTREMITY, DRAINAGE OF LEFT ARM WOUND, A-CELL PLACEMENT, WOUND VAC PLACEMENT;  Surgeon: Alena Bills Dillingham, DO;  Location[facility];  Service: Plastics;  Laterality: Left;  . INCISION AND DRAINAGE OF WOUND Left 08/05/2015   Procedure: IRRIGATION AND DEBRIDEMENT LEFT ELBOW WOUND, PLACEMENT OF ACELL;  Surgeon: Peggye Form, DO;  Location: [facility];  Service: Plastics;  Laterality: Left;  . KNEE ARTHROSCOPY WITH MEDIAL MENISECTOMY Right 02/19/2019   Procedure: RIGHT KNEE ARTHROSCOPY, DEBRIDEMENT, PARTIAL MEDIAL AND LATERAL MENISECTOMY;  Surgeon: Valeria Batman, MD;  Location: [facility];  Service: Orthopedics;  Laterality: Right;  . LAPAROSCOPIC CHOLECYSTECTOMY  1998  . SKIN GRAFT Left 2016   took from anterior thigh; placed at elbow  . TONSILLECTOMY  ~ 2000  . TOTAL ABDOMINAL HYSTERECTOMY  2003  . WRIST SURGERY Right 01/2016    Family History  Problem Relation Age of Onset  . Liver disease Mother   . Dementia Mother   . Cirrhosis Mother   . Prostate cancer Father   . Colon cancer Maternal Grandmother   . Ovarian cancer Maternal Aunt     SOCIAL HX:   reports that she has never smoked. She has never used smokeless tobacco. She reports that she does not drink alcohol or use drugs.   Current Outpatient Medications:  .  butalbital-acetaminophen-caffeine (FIORICET) 50-325-40 MG tablet, TAKE 1 TABLET BY MOUTH EVERY 6 HOURS AS NEEDED FOR HEADACHE, Disp: 10 tablet, Rfl: 3 .  gabapentin (NEURONTIN) 300 MG capsule, Take 2 capsules (600 mg total) by mouth 3 (three) times daily., Disp: 180 capsule, Rfl: 2 .  chlorpheniramine-HYDROcodone (TUSSIONEX PENNKINETIC ER) 10-8  MG/5ML SUER, Take 5 mLs by mouth at bedtime as needed for cough. (Patient not taking: Reported on 12/25/2019), Disp: 140 mL, Rfl: 0 .  diazepam (VALIUM) 10 MG tablet, 1  qam   2  qhs, Disp: 90 tablet, Rfl: 4 .  venlafaxine XR (EFFEXOR-XR) 75 MG 24 hr capsule, 1  qam, Disp: 90 capsule, Rfl: 1  EXAM:   VITALS per patient if applicable: none reported  GENERAL: alert, oriented, appears well and in no acute distress  HEENT: atraumatic, conjunttiva clear, no obvious abnormalities on inspection of external nose and ears  NECK: normal movements of the head and neck  LUNGS: on inspection no signs of respiratory distress, breathing rate appears normal, no obvious gross increased work of breathing, gasping or wheezing  CV: no obvious cyanosis  MS: moves all visible extremities without noticeable abnormality  PSYCH/NEURO: pleasant and cooperative, no obvious depression or anxiety, speech and thought processing grossly intact  ASSESSMENT AND PLAN:   Neuropathy  - Plan: gabapentin (NEURONTIN) 300 MG capsule  Acute pain of left knee -PDMP has been reviewed. She has a very high ORS of 650. The controlled substance database has flagged her for pharmacies used and number of prescribers. -She also has 2  additional controlled substance flags in her chart dating back to 2017 and 2018 for the same concerns. -No further narcotics to be prescribed from this office. -Advised to follow up with [name] and consider the knee replacement surgery that he has suggested would be beneficial per her report.     I discussed the assessment and treatment plan with the patient. The patient was provided an opportunity to ask questions and all were answered. The patient agreed with the plan and demonstrated an understanding of the instructions.   The patient was advised to call back or seek an in-person evaluation if the symptoms worsen or if the condition fails to improve as anticipated.    Chaya Jan, MD  [facility] at [facility]

## 2019-12-28 MED FILL — BUTALB-ACETAMIN-CAFF 50-325: 50-325-40 | 3 days supply | Qty: 10 | Fill #1

## 2019-12-31 NOTE — Progress Notes (Deleted)
Virtual Visit via Video Note The purpose of this virtual visit is to provide medical care while limiting exposure to the novel coronavirus.    Consent was obtained for video visit:  Yes.   Answered questions that patient had about telehealth interaction:  Yes.   I discussed the limitations, risks, security and privacy concerns of performing an evaluation and management service by telemedicine. I also discussed with the patient that there may be a patient responsible charge related to this service. The patient expressed understanding and agreed to proceed.  Pt location: Home Physician Location: office Name of referring provider:  Philip Aspen, Almira Bar* I connected with Sabrina [name] at patients initiation/request on 01/01/2020 at  8:10 AM EDT by video enabled telemedicine application and verified that I am speaking with the correct person using two identifiers. Pt MRN:  [id number] Pt DOB:  02/07/1966 Video Participants:  Sabrina [name]   History of Present Illness:  Sabrina [name] is a 54 year old Caucasian woman with bullous pemphigoid and anxiety with panic disorder who follows up for migraines and tension-type headaches.  UPDATE: Venlafaxine was increased to 75mg  daily in September (discussed with her psychiatrist, Dr. October).  Intensity:  8/10 Duration:  1 day Frequency:  2 days a week Frequency of abortive medication: 2 days a week Current NSAIDS:none Current analgesics:Fioricet Current triptans:none Current ergotamine:none Current anti-emetic:promethazine 25mg  Current muscle relaxants:none Current anti-anxiolytic:Valium Current sleep aide:none Current Antihypertensive medications:none Current Antidepressant medications:Venlafaxine XR75mg  daily Current Anticonvulsant medications:Gabapentin 600mg  three times daily Current anti-CGRP:none Current Vitamins/Herbal/Supplements:D Current Antihistamines/Decongestants:none Other  therapy:none Hormone/birth control:none  Caffeine:No coffee or colas Diet:Hydrates with water. Smooties Exercise:Tries to walk but has trouble with her knees. Likes to ride her horse but first needs knee surgery Depression/Anxiety:Significant anxiety with panic attacks. Increased anxiety as father recently had a stroke in [place] and she was unable to visit him due to COVID-19. She also has been struggling Stress related to increased pain due to thebullous pemphigoid Other pain:pain due to thebullous pemphigoid. Bilateral knee pain Sleep hygiene:poor  HISTORY: Onset:  Her late-20s Location:Over left eyebrow Quality:Stabbing, pounding, pressure Initial intensity:8-9/10.Shedenies new headache, thunderclap headache Aura:No Premonitory Phase:No Postdrome:no Associated symptoms:Nausea, vomiting.Shedenies associated photophobia, phonophobia,unilateral numbness or weakness. Initial Duration:Usually 1 day. Longest lasted 3 days. Initial Frequency:3 to 4 days a month but more frequently lately due to increased stress. Initial Frequency of abortive medication:Fioricet 1-2 days a week. Excedrin most days a week (for tension-type headache) Triggers:  Emotional stress Relieving factors:Laying down Activity:Aggravated when sitting up or standing upright She also has tension type headaches which are mild to moderate bifrontal pressure, almost daily  Past NSAIDS:naproxen Past analgesics:tramadol, Excedrin, Goody powder, Tylenol Past abortive triptans:rizatriptan 10mg  Past abortive ergotamine:none Past muscle relaxants:none Past anti-emetic:Compazine 10mg , Phenergan, Zofran (allergy) Past antihypertensive medications:none Past antidepressant medications:Lexapro, Paxil, Zoloft Past anticonvulsant medications:Topiramate 25mg  twice daily(on for one month) Past anti-CGRP:none Past  vitamins/Herbal/Supplements:none Past antihistamines/decongestants:none Other past therapies:none  Family history of headache:Father (migraines)  Past Medical History: Past Medical History:  Diagnosis Date  . Arthritis    "knees" (09/06/2017)  . Bullous pemphigus   . Cat bite 06/2014   to left elbow  . History of blood transfusion 1988   "when I had my baby"  . Muscle weakness of lower extremity 2001; 09/05/2017   "resolved after a couple weeks; ?" (09/06/2017)  . 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 Poisoning, snake bite 04/08/2016   "[place]; RUE"  . PONV (postoperative nausea and vomiting)   . S/P  right knee surgery   . Situational anxiety   . Staph infection ~ 2015   "left elbow and finger"    Medications: Outpatient Encounter Medications as of 01/01/2020  Medication Sig  . butalbital-acetaminophen-caffeine (FIORICET) 50-325-40 MG tablet TAKE 1 TABLET BY MOUTH EVERY 6 HOURS AS NEEDED FOR HEADACHE  . chlorpheniramine-HYDROcodone (TUSSIONEX PENNKINETIC ER) 10-8 MG/5ML SUER Take 5 mLs by mouth at bedtime as needed for cough. (Patient not taking: Reported on 12/25/2019)  . diazepam (VALIUM) 10 MG tablet 1  qam   2  qhs  . gabapentin (NEURONTIN) 300 MG capsule Take 2 capsules (600 mg total) by mouth 3 (three) times daily.  Marland Kitchen venlafaxine XR (EFFEXOR-XR) 75 MG 24 hr capsule 1  qam   No facility-administered encounter medications on file as of 01/01/2020.    Allergies: Allergies  Allergen Reactions  . Zofran [Ondansetron Hcl] Hives and Other (See Comments)    Spots around iv site  . Droperidol Palpitations  . Flexeril [Cyclobenzaprine] Anxiety  . Morphine And Related Itching  . Tessalon [Benzonatate] Other (See Comments)    Conjunctivitis, watery eyes.  . Voltaren [Diclofenac Sodium] Rash    Family History: Family History  Problem Relation Age of Onset  . Liver disease Mother   . Dementia Mother   . Cirrhosis Mother   . Prostate cancer Father   . Colon  cancer Maternal Grandmother   . Ovarian cancer Maternal Aunt     Social History: Social History   Socioeconomic History  . Marital status: Divorced    Spouse name: Not on file  . Number of children: 1  . Years of education: Not on file  . Highest education level: Associate degree: academic program  Occupational History  . Occupation: unemployed  Tobacco Use  . Smoking status: Never Smoker  . Smokeless tobacco: Never Used  Substance and Sexual Activity  . Alcohol use: Never    Alcohol/week: 0.0 standard drinks    Comment: social  . Drug use: No  . Sexual activity: Not Currently  Other Topics Concern  . Not on file  Social History Narrative   Pateint is right-handed. She lives alone in a single level home. She rarely drinks caffeine. She is limited to exercise due to knee injuries.   Social Determinants of Health   Financial Resource Strain:   . Difficulty of Paying Living Expenses:   Food Insecurity:   . Worried About Programme researcher, broadcasting/film/video in the Last Year:   . Barista in the Last Year:   Transportation Needs:   . Freight forwarder (Medical):   Marland Kitchen Lack of Transportation (Non-Medical):   Physical Activity:   . Days of Exercise per Week:   . Minutes of Exercise per Session:   Stress:   . Feeling of Stress :   Social Connections:   . Frequency of Communication with Friends and Family:   . Frequency of Social Gatherings with Friends and Family:   . Attends Religious Services:   . Active Member of Clubs or Organizations:   . Attends Banker Meetings:   Marland Kitchen Marital Status:   Intimate Partner Violence:   . Fear of Current or Ex-Partner:   . Emotionally Abused:   Marland Kitchen Physically Abused:   . Sexually Abused:     Observations/Objective:   *** No acute distress.  Alert and oriented.  Speech fluent and not dysarthric.  Language intact.  Eyes orthophoric on primary gaze.  Face symmetric.  Assessment and Plan:  1.  Migraine without aura, without  status migrainosus, not intractable 2.  Tension-type headache, not intractable  1.  For preventative management, *** 2.  For abortive therapy, Fioricet 3.  Limit use of pain relievers to no more than 2 days out of week to prevent risk of rebound or medication-overuse headache. 4.  Keep headache diary 5.  Exercise, hydration, caffeine cessation, sleep hygiene, monitor for and avoid triggers 6. Follow up ***   Follow Up Instructions:    -I discussed the assessment and treatment plan with the patient. The patient was provided an opportunity to ask questions and all were answered. The patient agreed with the plan and demonstrated an understanding of the instructions.   The patient was advised to call back or seek an in-person evaluation if the symptoms worsen or if the condition fails to improve as anticipated.    Dudley Major, DO

## 2020-01-01 ENCOUNTER — Telehealth: Payer: Self-pay | Admitting: Neurology

## 2020-01-01 ENCOUNTER — Other Ambulatory Visit: Payer: Self-pay

## 2020-01-01 ENCOUNTER — Ambulatory Visit: Payer: Self-pay | Admitting: Internal Medicine

## 2020-01-02 IMAGING — DX DG CHEST 1V PORT
1 series · 1 of 1 positions shown · non-contrast
Comparison: March 30, 2019

CLINICAL DATA: Cough.  4REA2-FC positive

EXAM:
PORTABLE CHEST 1 VIEW

[chest ap]
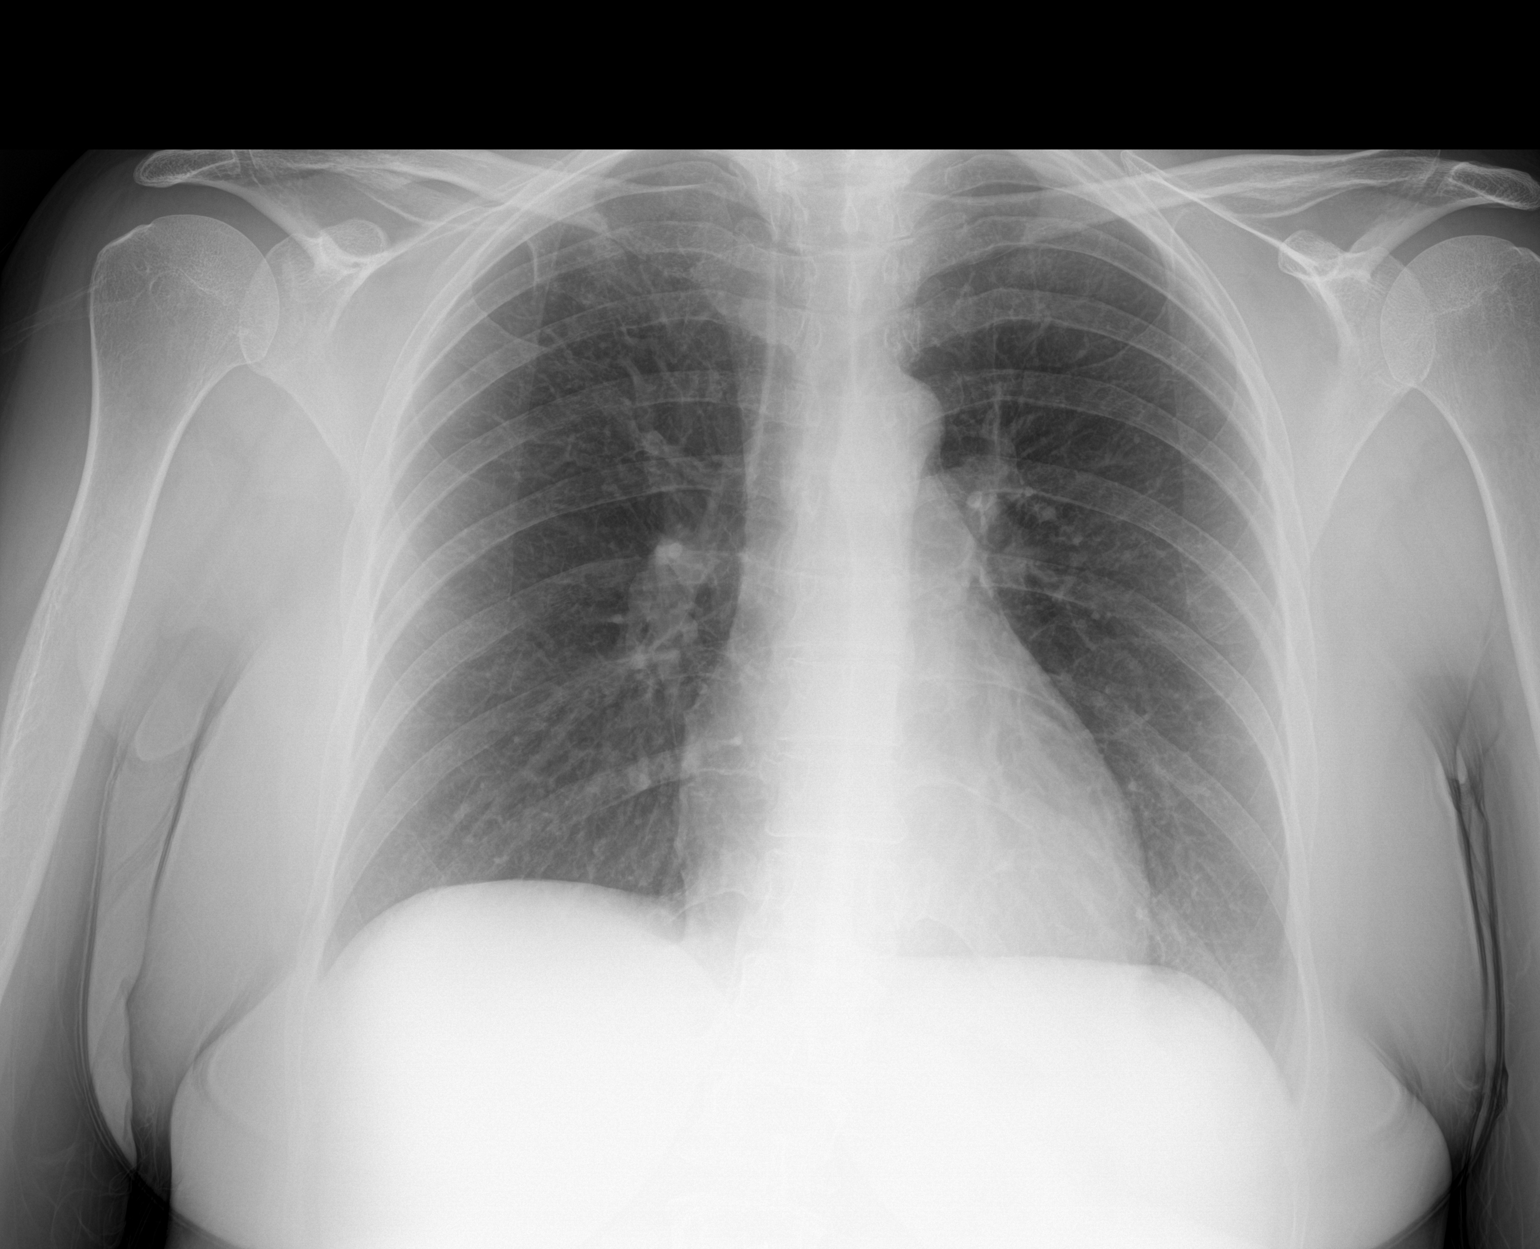

[1 of 1 positions shown; findings below may reference images not displayed]

FINDINGS: Lungs are clear. Heart size and pulmonary vascularity are normal. No
adenopathy. No bone lesions.
IMPRESSION: No edema or consolidation.

## 2020-01-06 MED FILL — BUTALB-ACETAMIN-CAFF 50-325: 50-325-40 | 3 days supply | Qty: 10 | Fill #2

## 2020-01-14 MED FILL — DIAZEPAM 10 MG TABS: 10 | 30 days supply | Qty: 90 | Fill #1

## 2020-01-15 ENCOUNTER — Encounter: Payer: Self-pay | Admitting: Internal Medicine

## 2020-01-16 ENCOUNTER — Encounter: Payer: Self-pay | Admitting: Orthopaedic Surgery

## 2020-01-16 ENCOUNTER — Encounter: Payer: Self-pay | Admitting: Internal Medicine

## 2020-01-16 DIAGNOSIS — M25562 Pain in left knee: Secondary | ICD-10-CM

## 2020-01-21 ENCOUNTER — Ambulatory Visit (INDEPENDENT_AMBULATORY_CARE_PROVIDER_SITE_OTHER): Payer: Self-pay | Admitting: Orthopaedic Surgery

## 2020-01-21 ENCOUNTER — Encounter: Payer: Self-pay | Admitting: Orthopaedic Surgery

## 2020-01-21 ENCOUNTER — Other Ambulatory Visit: Payer: Self-pay

## 2020-01-21 ENCOUNTER — Ambulatory Visit (INDEPENDENT_AMBULATORY_CARE_PROVIDER_SITE_OTHER): Payer: Self-pay

## 2020-01-21 ENCOUNTER — Ambulatory Visit: Payer: Self-pay | Admitting: Orthopaedic Surgery

## 2020-01-21 VITALS — Ht 64.0 in | Wt 172.0 lb

## 2020-01-21 DIAGNOSIS — M1711 Unilateral primary osteoarthritis, right knee: Secondary | ICD-10-CM

## 2020-01-21 DIAGNOSIS — M79672 Pain in left foot: Secondary | ICD-10-CM

## 2020-01-21 NOTE — Progress Notes (Signed)
Office Visit Note   Patient: Sabrina [name]           Date of Birth: 1966/02/09           MRN: [id number] Visit Date: 01/21/2020              Requested by: Sabrina [name], Sabrina Patricia, MD [address] [place],  [place] [address] PCP: Sabrina [name], Sabrina Patricia, MD   Assessment & Plan: Visit Diagnoses:  1. Pain in left foot   2. Primary osteoarthritis of right knee     Plan: Chronic, persistent pain right knee with x-rays consistent with end-stage osteoarthritis in all 3 compartments.  Long discussion over 30 minutes with Sabrina [name] regarding her knee.  I think the only treatment at this point given her past history of problems is knee replacement.  I think this will be a very difficult postoperative course for her given her history of low tolerance for pain and need for pain medicine.  Discussed this in detail.  She does not have any insurance and is working with [employer] for coverage..  Will need medical clearance with her primary care physician I will provide a clearance form.  We will set up the surgery at some point in the future.  She does have help at home.  Again, I expect this to be a difficult postoperative course with persistent pain and possibly some loss of motion .  Recently had an injury to her left foot with some pain in the area of the fourth metatarsal phalangeal joint.  X-rays were negative.  She is feeling better I think she is fine which is comfortable shoes.  I suspect this is soft tissue and will resolve on its own  Follow-Up Instructions: Return We will schedule right total knee replacement.   Orders:  Orders Placed This Encounter  Procedures  . XR Foot Complete Left   No orders of the defined types were placed in this encounter.     Procedures: No procedures performed   Clinical Data: No additional findings.   Subjective: Chief Complaint  Patient presents with  . Right Knee - Pain  . Left foot  Patient presents today for right knee pain. She  was here last in December and received a cortisone injection. She said that it helped for about a week. She has had to quit her job due to pain. She was told before her next step would be replacement. Her pain is worse medially and swells every days. She takes Tylenol and applies Voltaren gel, along with icing. She said that nothing seems to help.  Two weeks ago she was running through the woods trying to catch her dog and injured her left foot. Her pain is located in her forefoot, directly behind her toes. She said that the pain is better with wearing shoes. She said that she is still unable to end her toes.  Prior films demonstrate advanced tricompartmental arthritis predominate in the medial compartment where she is near bone-on-bone  HPI  Review of Systems   Objective: Vital Signs: Ht 5\' 4"  (1.626 m)   Wt 172 lb (78 kg)   BMI 29.52 kg/m   Physical Exam Constitutional:      Appearance: She is well-developed.  Eyes:     Pupils: Pupils are equal, round, and reactive to light.  Pulmonary:     Effort: Pulmonary effort is normal.  Skin:    General: Skin is warm and dry.  Neurological:  Mental Status: She is alert and oriented to person, place, and time.  Psychiatric:        Behavior: Behavior normal.     Ortho Exam awake alert and oriented x3.  Comfortable sitting.  Left foot examined with some mild tenderness in the area of the fourth metatarsal phalangeal joint.  Very mild edema but no ecchymosis or erythema.  Toe was not swollen.  Very minimal discomfort along the fourth metatarsal and at the metatarsal phalangeal joint.  No deformity.  Nail intact.  No ankle pain.  Right knee with diffuse medial joint tenderness.  Mild varus.  Full extension and flexed about 112 to 13 degrees with a goniometer.  No instability.  Could have a very small effusion.  Skin intact.  No calf pain.  No popliteal fullness  Specialty Comments:  No specialty comments available.  Imaging: XR Foot  Complete Left  Result Date: 01/21/2020 Films of the left foot were obtained in 3 projections.  I did not see any evidence of acute change i.e. fracture.  Patient has been complaining of pain in the area of the fourth toe metatarsal phalangeal joint but no x-ray changes.    PMFS History: Patient Active Problem List   Diagnosis Date Noted  . Pain in left foot 01/21/2020  . Primary osteoarthritis of right knee 08/06/2019  . 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 Other meniscus derangements, posterior horn of medial meniscus, left knee 02/19/2019  . Bucket handle tear of lateral meniscus 02/19/2019  . Unilateral primary osteoarthritis, right knee 12/13/2018  . Panic disorder 12/13/2018  . Chronic headaches 12/13/2018  . Weakness 09/06/2017  . Depression 09/06/2017  . 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  . Generalized anxiety disorder with panic attacks 09/06/2017  . Right hand pain   . Wrist swelling   . Cellulitis of right upper extremity 04/11/2016  . Elbow pain 07/20/2015  . Anxiety 07/20/2015  . Tachycardia 07/04/2015  . 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[facility])   . Skin ulcer of upper arm, limited to breakdown of skin ([facility]) 07/01/2015   Past Medical History:  Diagnosis Date  . Arthritis    "knees" (09/06/2017)  . Bullous pemphigus   . Cat bite 06/2014   to left elbow  . History of blood transfusion 1988   "when I had my baby"  . Muscle weakness of lower extremity 2001; 09/05/2017   "resolved after a couple weeks; ?" (09/06/2017)  . 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[facility])   . Poisoning, snake bite 04/08/2016   "[place]; RUE"  . PONV (postoperative nausea and vomiting)   . S/P right knee surgery   . Situational anxiety   . Staph infection ~ 2015   "left elbow and finger"    Family History  Problem Relation Age of Onset  . Liver disease Mother   . Dementia Mother   . Cirrhosis Mother   . Prostate cancer Father   . Colon cancer Maternal Grandmother   . Ovarian cancer Maternal Aunt     Past Surgical  History:  Procedure Laterality Date  . APPENDECTOMY  ~ 1987  . APPLICATION OF A-CELL OF EXTREMITY Left 08/05/2015   Procedure: APPLICATION OF A-CELL OF EXTREMITY;  Surgeon: Wallace Going, DO;  Location: [facility];  Service: Plastics;  Laterality: Left;  . BREAST SURGERY Right 1990   "milk duct taken out"  . CHONDROPLASTY Right 02/19/2019   Procedure: CHONDROPLASTY; EXCISION EXOSTOSIS;  Surgeon: Garald Balding, MD;  Location: [facility];  Service: Orthopedics;  Laterality: Right;  . DEBRIDEMENT AND CLOSURE WOUND Left 07/01/2015   Procedure: LEFT ELBOW EXCISION OF WOUND WITH PRIMARY CLOSURE 2X5 CM ;  Surgeon: Peggye Form, DO;  Location: [facility];  Service: Plastics;  Laterality: Left;  . ELBOW SURGERY Left X 23 in [place] <06/2015   from a cat bite; all I&D  . I & D EXTREMITY Left 07/08/2015   Procedure: IRRIGATION AND DEBRIDEMENT EXTREMITY, DRAINAGE OF LEFT ARM WOUND, A-CELL PLACEMENT, WOUND VAC PLACEMENT;  Surgeon: Alena Bills Dillingham, DO;  Location: [facility];  Service: Plastics;  Laterality: Left;  . INCISION AND DRAINAGE OF WOUND Left 08/05/2015   Procedure: IRRIGATION AND DEBRIDEMENT LEFT ELBOW WOUND, PLACEMENT OF ACELL;  Surgeon: Peggye Form, DO;  Location: [facility];  Service: Plastics;  Laterality: Left;  . KNEE ARTHROSCOPY WITH MEDIAL MENISECTOMY Right 02/19/2019   Procedure: RIGHT KNEE ARTHROSCOPY, DEBRIDEMENT, PARTIAL MEDIAL AND LATERAL MENISECTOMY;  Surgeon: Valeria Batman, MD;  Location: [facility];  Service: Orthopedics;  Laterality: Right;  . LAPAROSCOPIC CHOLECYSTECTOMY  1998  . SKIN GRAFT Left 2016   took from anterior thigh; placed at elbow  . TONSILLECTOMY  ~ 2000  . TOTAL ABDOMINAL HYSTERECTOMY  2003  . WRIST SURGERY Right 01/2016   Social History   Occupational History  . Occupation: unemployed  Tobacco Use  . Smoking status: Never Smoker  . Smokeless tobacco:  Never Used  Substance and Sexual Activity  . Alcohol use: Never    Alcohol/week: 0.0 standard drinks    Comment: social  . Drug use: No  . Sexual activity: Not Currently

## 2020-01-23 ENCOUNTER — Encounter: Payer: Self-pay | Admitting: Orthopaedic Surgery

## 2020-01-23 ENCOUNTER — Other Ambulatory Visit: Payer: Self-pay

## 2020-01-23 NOTE — Telephone Encounter (Signed)
Please advise 

## 2020-01-24 ENCOUNTER — Encounter: Payer: Self-pay | Admitting: Internal Medicine

## 2020-01-24 ENCOUNTER — Ambulatory Visit (INDEPENDENT_AMBULATORY_CARE_PROVIDER_SITE_OTHER): Payer: Self-pay | Admitting: Internal Medicine

## 2020-01-24 VITALS — BP 110/70 | HR 80 | Temp 97.3°F | Ht 64.0 in | Wt 182.9 lb

## 2020-01-24 DIAGNOSIS — L120 Bullous pemphigoid: Secondary | ICD-10-CM

## 2020-01-24 DIAGNOSIS — M170 Bilateral primary osteoarthritis of knee: Secondary | ICD-10-CM

## 2020-01-24 DIAGNOSIS — Z0000 Encounter for general adult medical examination without abnormal findings: Secondary | ICD-10-CM

## 2020-01-24 NOTE — Patient Instructions (Signed)
-  Nice seeing you today!!  -Return fasting next week for lab work.  -See you back in 6 months.

## 2020-01-24 NOTE — Progress Notes (Signed)
Established Patient Office Visit     This visit occurred during the SARS-CoV-2 public health emergency.  Safety protocols were in place, including screening questions prior to the visit, additional usage of staff PPE, and extensive cleaning of exam room while observing appropriate contact time as indicated for disinfecting solutions.    CC/Reason for Visit: Annual preventive exam  HPI: Sabrina [name] is a 54 y.o. female who is coming in today for the above mentioned reasons. Past Medical History is significant for: Bilateral knee osteoarthritis, bullous pemphigoid,.  She has been having lots of issues with her knees.  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  She has seen [name] who is considering replacing both knees.  She brings in her preop clearance form today.  She is due for Covid, shingles vaccinations, she is overdue for colonoscopy and mammogram, she has had Complete Hysterectomy and Was Told she did not need further Pap smears.  She does not have routine eye or dental care.  She has no acute complaints today.  Past Medical/Surgical History: Past Medical History:  Diagnosis Date  . Arthritis    "knees" (09/06/2017)  . Bullous pemphigus   . Cat bite 06/2014   to left elbow  . History of blood transfusion 1988   "when I had my baby"  . Muscle weakness of lower extremity 2001; 09/05/2017   "resolved after a couple weeks; ?" (09/06/2017)  . 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 Poisoning, snake bite 04/08/2016   "[place]; RUE"  . PONV (postoperative nausea and vomiting)   . S/P right knee surgery   . Situational anxiety   . Staph infection ~ 2015   "left elbow and finger"    Past Surgical History:  Procedure Laterality Date  . APPENDECTOMY  ~ 1987  . APPLICATION OF A-CELL OF EXTREMITY Left 08/05/2015   Procedure: APPLICATION OF A-CELL OF EXTREMITY;  Surgeon: Peggye Form, DO;  Location: [facility];  Service:  Plastics;  Laterality: Left;  . BREAST SURGERY Right 1990   "milk duct taken out"  . CHONDROPLASTY Right 02/19/2019   Procedure: CHONDROPLASTY; EXCISION EXOSTOSIS;  Surgeon: Valeria Batman, MD;  Location: [facility];  Service: Orthopedics;  Laterality: Right;  . DEBRIDEMENT AND CLOSURE WOUND Left 07/01/2015   Procedure: LEFT ELBOW EXCISION OF WOUND WITH PRIMARY CLOSURE 2X5 CM ;  Surgeon: Peggye Form, DO;  Location: [facility];  Service: Plastics;  Laterality: Left;  . ELBOW SURGERY Left X 23 in [place] <06/2015   from a cat bite; all I&D  . I & D EXTREMITY Left 07/08/2015   Procedure: IRRIGATION AND DEBRIDEMENT EXTREMITY, DRAINAGE OF LEFT ARM WOUND, A-CELL PLACEMENT, WOUND VAC PLACEMENT;  Surgeon: Alena Bills Dillingham, DO;  Location: [facility];  Service: Plastics;  Laterality: Left;  . INCISION AND DRAINAGE OF WOUND Left 08/05/2015   Procedure: IRRIGATION AND DEBRIDEMENT LEFT ELBOW WOUND, PLACEMENT OF ACELL;  Surgeon: Peggye Form, DO;  Location: [facility];  Service: Plastics;  Laterality: Left;  . KNEE ARTHROSCOPY WITH MEDIAL MENISECTOMY Right 02/19/2019   Procedure: RIGHT KNEE ARTHROSCOPY, DEBRIDEMENT, PARTIAL MEDIAL AND LATERAL MENISECTOMY;  Surgeon: Valeria Batman, MD;  Location: [facility];  Service: Orthopedics;  Laterality: Right;  . LAPAROSCOPIC CHOLECYSTECTOMY  1998  . SKIN GRAFT Left 2016   took from anterior thigh; placed at elbow  . TONSILLECTOMY  ~ 2000  .  TOTAL ABDOMINAL HYSTERECTOMY  2003  . WRIST SURGERY Right 01/2016    Social History:  reports that she has never smoked. She has never used smokeless tobacco. She reports that she does not drink alcohol or use drugs.  Allergies: Allergies  Allergen Reactions  . Zofran [Ondansetron Hcl] Hives and Other (See Comments)    Spots around iv site  . Droperidol Palpitations  . Flexeril [Cyclobenzaprine] Anxiety  . Morphine And Related Itching  .  Tessalon [Benzonatate] Other (See Comments)    Conjunctivitis, watery eyes.  . Voltaren [Diclofenac Sodium] Rash    Family History:  Family History  Problem Relation Age of Onset  . Liver disease Mother   . Dementia Mother   . Cirrhosis Mother   . Prostate cancer Father   . Colon cancer Maternal Grandmother   . Ovarian cancer Maternal Aunt      Current Outpatient Medications:  .  butalbital-acetaminophen-caffeine (FIORICET) 50-325-40 MG tablet, TAKE 1 TABLET BY MOUTH EVERY 6 HOURS AS NEEDED FOR HEADACHE, Disp: 10 tablet, Rfl: 3 .  diazepam (VALIUM) 10 MG tablet, 1  qam   2  qhs, Disp: 90 tablet, Rfl: 4 .  gabapentin (NEURONTIN) 300 MG capsule, Take 2 capsules (600 mg total) by mouth 3 (three) times daily., Disp: 180 capsule, Rfl: 2 .  venlafaxine XR (EFFEXOR-XR) 75 MG 24 hr capsule, 1  qam, Disp: 90 capsule, Rfl: 1  Review of Systems:  Constitutional: Denies fever, chills, diaphoresis, appetite change and fatigue.  HEENT: Denies photophobia, eye pain, redness, hearing loss, ear pain, congestion, sore throat, rhinorrhea, sneezing, mouth sores, trouble swallowing, neck pain, neck stiffness and tinnitus.   Respiratory: Denies SOB, DOE, cough, chest tightness,  and wheezing.   Cardiovascular: Denies chest pain, palpitations and leg swelling.  Gastrointestinal: Denies nausea, vomiting, abdominal pain, diarrhea, constipation, blood in stool and abdominal distention.  Genitourinary: Denies dysuria, urgency, frequency, hematuria, flank pain and difficulty urinating.  Endocrine: Denies: hot or cold intolerance, sweats, changes in hair or nails, polyuria, polydipsia. Musculoskeletal: Denies myalgias, back pain, joint swelling, arthralgias and gait problem.  Skin: Denies pallor, rash and wound.  Neurological: Denies dizziness, seizures, syncope, weakness, light-headedness, numbness and headaches.  Hematological: Denies adenopathy. Easy bruising, personal or family bleeding history    Psychiatric/Behavioral: Denies suicidal ideation, mood changes, confusion, nervousness, sleep disturbance and agitation    Physical Exam: Vitals:   01/24/20 1457  BP: 110/70  Pulse: 80  Temp: (!) 97.3 F (36.3 C)  TempSrc: Temporal  SpO2: 96%  Weight: 182 lb 14.4 oz (83 kg)  Height: 5\' 4"  (1.626 m)    Body mass index is 31.39 kg/m.   Constitutional: NAD, calm, comfortable Eyes: PERRL, lids and conjunctivae normal ENMT: Mucous membranes are moist.  Tympanic membrane is pearly white, no erythema or bulging. Neck: normal, supple, no masses, no thyromegaly Respiratory: clear to auscultation bilaterally, no wheezing, no crackles. Normal respiratory effort. No accessory muscle use.  Cardiovascular: Regular rate and rhythm, no murmurs / rubs / gallops. No extremity edema. 2+ pedal pulses. No carotid bruits.  Abdomen: no tenderness, no masses palpated. No hepatosplenomegaly. Bowel sounds positive.  Musculoskeletal: no clubbing / cyanosis. No joint deformity upper and lower extremities. Good ROM, no contractures. Normal muscle tone.  Skin: no rashes, lesions, ulcers. No induration Neurologic: CN 2-12 grossly intact. Sensation intact, DTR normal. Strength 5/5 in all 4.  Psychiatric: Normal judgment and insight. Alert and oriented x 3. Normal mood.    Impression and Plan:  Encounter for  preventive health examination  -Advised routine eye and dental care. -Due for Covid and shingles vaccinations. -Healthy lifestyle discussed in detail. -She will return for fasting labs as she is nonfasting today. -Schedule mammogram. -Initiate GI referral for screening colonoscopy. -Per report she no longer requires Pap smears due to having complete hysterectomy.  Bullous pemphigoid -Stable, sees dermatology as needed.  Osteoarthritis of both knees, unspecified osteoarthritis type -She will be having knee replacement surgery as soon as her patient assistance comes in.   Patient Instructions   -Nice seeing you today!!  -Return fasting next week for lab work.  -See you back in 6 months.     Chaya Jan, MD [facility]

## 2020-01-27 ENCOUNTER — Other Ambulatory Visit: Payer: Self-pay

## 2020-01-31 ENCOUNTER — Encounter: Payer: Self-pay | Admitting: Internal Medicine

## 2020-02-07 ENCOUNTER — Telehealth: Payer: Self-pay | Admitting: Orthopaedic Surgery

## 2020-02-07 NOTE — Telephone Encounter (Signed)
Left message on patient's voicemail requesting she call me to discuss surgery date and insurance.

## 2020-02-11 MED FILL — DIAZEPAM 10 MG TABS: 10 | 30 days supply | Qty: 90 | Fill #2

## 2020-02-12 ENCOUNTER — Telehealth: Payer: Self-pay

## 2020-02-13 ENCOUNTER — Telehealth: Payer: Self-pay | Admitting: *Deleted

## 2020-02-13 ENCOUNTER — Other Ambulatory Visit: Payer: Self-pay | Admitting: Internal Medicine

## 2020-02-13 DIAGNOSIS — G8929 Other chronic pain: Secondary | ICD-10-CM

## 2020-02-13 MED ORDER — BUTALBITAL-APAP-CAFFEINE 50-325-40 MG PO TABS: ORAL_TABLET | ORAL | 3 refills | Status: DC

## 2020-02-13 MED FILL — BUTALB-ACETAMIN-CAFF 50-325: 50-325-40 | 2 days supply | Qty: 10 | Fill #0

## 2020-02-13 NOTE — Telephone Encounter (Signed)
Patient called after hours line. Patient reports she sent a message 2 weeks ago about medication for headaches and her neurologist is too expensive right now for her and she is needing this medication filled.

## 2020-02-14 NOTE — Telephone Encounter (Signed)
Pt called in today stating that she will be doing her labs as soon as possible at this present time she is not able to do so due to her car breaking down and starting a new job.  As soon as she gets her insurance and is given a day off she will call to schedule a lab appointment.  And she also stated if she can get a refill on her Fiorcet she would greatly appreciate it.

## 2020-02-17 ENCOUNTER — Telehealth (INDEPENDENT_AMBULATORY_CARE_PROVIDER_SITE_OTHER): Payer: Self-pay | Admitting: Family Medicine

## 2020-02-17 ENCOUNTER — Telehealth: Payer: Self-pay

## 2020-02-17 ENCOUNTER — Encounter: Payer: Self-pay | Admitting: Family Medicine

## 2020-02-17 DIAGNOSIS — J40 Bronchitis, not specified as acute or chronic: Secondary | ICD-10-CM

## 2020-02-17 MED ORDER — HYDROCODONE-HOMATROPINE 5-1.5 MG/5ML PO SYRP: 5.0000 mL | ORAL_SOLUTION | ORAL | 0 refills | Status: DC | PRN

## 2020-02-17 NOTE — Progress Notes (Signed)
Subjective:    Patient ID: Sabrina [name], female    DOB: 12/24/1965, 54 y.o.   MRN: [id number]  HPI Here for 3 days of chest congestion, coughing up yellow sputum, and fever to 100.1 degrees. No chest pain or SOB. No body aches or NVD. She had a Covid-19 infection several months ago. She is using Mucinex and Delsym with no relief. She has a supply of Augmentin at home (she was prescribed this for a dog bite last February but never took it), and she asks if she should take this.  Virtual Visit via Video Note  I connected with the patient on 02/17/20 at  3:30 PM EDT by a video enabled telemedicine application and verified that I am speaking with the correct person using two identifiers.  Location patient: home Location provider:work or home office Persons participating in the virtual visit: patient, provider  I discussed the limitations of evaluation and management by telemedicine and the availability of in person appointments. The patient expressed understanding and agreed to proceed.   HPI:    ROS: See pertinent positives and negatives per HPI.  Past Medical History:  Diagnosis Date  . Arthritis    "knees" (09/06/2017)  . Bullous pemphigus   . Cat bite 06/2014   to left elbow  . History of blood transfusion 1988   "when I had my baby"  . Muscle weakness of lower extremity 2001; 09/05/2017   "resolved after a couple weeks; ?" (09/06/2017)  . 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[facility])   . Poisoning, snake bite 04/08/2016   "[place]; RUE"  . PONV (postoperative nausea and vomiting)   . S/P right knee surgery   . Situational anxiety   . Staph infection ~ 2015   "left elbow and finger"    Past Surgical History:  Procedure Laterality Date  . APPENDECTOMY  ~ 1987  . APPLICATION OF A-CELL OF EXTREMITY Left 08/05/2015   Procedure: APPLICATION OF A-CELL OF EXTREMITY;  Surgeon: Wallace Going, DO;  Location: [facility];  Service: Plastics;  Laterality: Left;  . BREAST  SURGERY Right 1990   "milk duct taken out"  . CHONDROPLASTY Right 02/19/2019   Procedure: CHONDROPLASTY; EXCISION EXOSTOSIS;  Surgeon: Garald Balding, MD;  Location: [facility];  Service: Orthopedics;  Laterality: Right;  . DEBRIDEMENT AND CLOSURE WOUND Left 07/01/2015   Procedure: LEFT ELBOW EXCISION OF WOUND WITH PRIMARY CLOSURE 2X5 CM ;  Surgeon: Wallace Going, DO;  Location: [facility];  Service: Plastics;  Laterality: Left;  . ELBOW SURGERY Left X 23 in [place] <06/2015   from a cat bite; all I&D  . I & D EXTREMITY Left 07/08/2015   Procedure: IRRIGATION AND DEBRIDEMENT EXTREMITY, DRAINAGE OF LEFT ARM WOUND, A-CELL PLACEMENT, WOUND VAC PLACEMENT;  Surgeon: Loel Lofty Dillingham, DO;  Location: [facility];  Service: Plastics;  Laterality: Left;  . INCISION AND DRAINAGE OF WOUND Left 08/05/2015   Procedure: IRRIGATION AND DEBRIDEMENT LEFT ELBOW WOUND, PLACEMENT OF ACELL;  Surgeon: Wallace Going, DO;  Location: [facility];  Service: Plastics;  Laterality: Left;  . KNEE ARTHROSCOPY WITH MEDIAL MENISECTOMY Right 02/19/2019   Procedure: RIGHT KNEE ARTHROSCOPY, DEBRIDEMENT, PARTIAL MEDIAL AND LATERAL MENISECTOMY;  Surgeon: Garald Balding, MD;  Location: [facility];  Service: Orthopedics;  Laterality: Right;  . LAPAROSCOPIC CHOLECYSTECTOMY  1998  . SKIN GRAFT Left 2016   took from anterior thigh; placed at elbow  . TONSILLECTOMY  ~ 2000  .  TOTAL ABDOMINAL HYSTERECTOMY  2003  . WRIST SURGERY Right 01/2016    Family History  Problem Relation Age of Onset  . Liver disease Mother   . Dementia Mother   . Cirrhosis Mother   . Prostate cancer Father   . Colon cancer Maternal Grandmother   . Ovarian cancer Maternal Aunt      Current Outpatient Medications:  .  butalbital-acetaminophen-caffeine (FIORICET) 50-325-40 MG tablet, TAKE 1 TABLET BY MOUTH EVERY 6 HOURS AS NEEDED FOR HEADACHE, Disp: 10 tablet, Rfl: 3 .  diazepam  (VALIUM) 10 MG tablet, 1  qam   2  qhs, Disp: 90 tablet, Rfl: 4 .  gabapentin (NEURONTIN) 300 MG capsule, Take 2 capsules (600 mg total) by mouth 3 (three) times daily., Disp: 180 capsule, Rfl: 2 .  HYDROcodone-homatropine (HYDROMET) 5-1.5 MG/5ML syrup, Take 5 mLs by mouth every 4 (four) hours as needed., Disp: 240 mL, Rfl: 0 .  venlafaxine XR (EFFEXOR-XR) 75 MG 24 hr capsule, 1  qam, Disp: 90 capsule, Rfl: 1  EXAM:  VITALS per patient if applicable:  GENERAL: alert, oriented, appears well and in no acute distress  HEENT: atraumatic, conjunttiva clear, no obvious abnormalities on inspection of external nose and ears  NECK: normal movements of the head and neck  LUNGS: on inspection no signs of respiratory distress, breathing rate appears normal, no obvious gross SOB, gasping or wheezing  CV: no obvious cyanosis  MS: moves all visible extremities without noticeable abnormality  PSYCH/NEURO: pleasant and cooperative, no obvious depression or anxiety, speech and thought processing grossly intact  ASSESSMENT AND PLAN: Bronchitis, use Hydromet for the cough. I told her to go ahead and take the Augmentin she has at home. Recheck prn.  Gershon Crane, MD  Discussed the following assessment and plan:  No diagnosis found.     I discussed the assessment and treatment plan with the patient. The patient was provided an opportunity to ask questions and all were answered. The patient agreed with the plan and demonstrated an understanding of the instructions.   The patient was advised to call back or seek an in-person evaluation if the symptoms worsen or if the condition fails to improve as anticipated.     Review of Systems     Objective:   Physical Exam        Assessment & Plan:

## 2020-02-21 ENCOUNTER — Telehealth: Payer: Self-pay

## 2020-02-21 NOTE — Telephone Encounter (Signed)
Patient returning phone call to sch surgery

## 2020-02-22 ENCOUNTER — Other Ambulatory Visit: Payer: Self-pay

## 2020-02-22 ENCOUNTER — Emergency Department (HOSPITAL_COMMUNITY)
Admission: EM | Admit: 2020-02-22 | Discharge: 2020-02-22 | Disposition: A | Discharge status: Home / Self Care | Payer: Self-pay | Attending: Emergency Medicine | Admitting: Emergency Medicine

## 2020-02-22 ENCOUNTER — Encounter (HOSPITAL_COMMUNITY): Payer: Self-pay

## 2020-02-22 DIAGNOSIS — Z5321 Procedure and treatment not carried out due to patient leaving prior to being seen by health care provider: Secondary | ICD-10-CM | POA: Insufficient documentation

## 2020-02-22 DIAGNOSIS — M7989 Other specified soft tissue disorders: Secondary | ICD-10-CM | POA: Insufficient documentation

## 2020-02-22 NOTE — ED Notes (Signed)
Called for patient in lobby and outside.

## 2020-02-22 NOTE — ED Notes (Signed)
Called for patient in lobby. No answer. Called X5.

## 2020-02-22 NOTE — ED Notes (Signed)
Called for patient in lobby X3. No answer.

## 2020-02-22 NOTE — ED Triage Notes (Signed)
Patient reports that she had an accident at work involving an automatic door and her left hand. Patient was seen at [facility] for this and was treated.  Patient c/o continued pain and hand swelling. Patient was told to come to the ED for further treatment.

## 2020-02-25 ENCOUNTER — Encounter (HOSPITAL_COMMUNITY): Payer: Self-pay | Admitting: *Deleted

## 2020-02-25 ENCOUNTER — Emergency Department (HOSPITAL_COMMUNITY)
Admission: EM | Admit: 2020-02-25 | Discharge: 2020-02-25 | Disposition: A | Discharge status: Home / Self Care | Payer: No Typology Code available for payment source | Attending: Emergency Medicine | Admitting: Emergency Medicine

## 2020-02-25 ENCOUNTER — Emergency Department (HOSPITAL_COMMUNITY): Payer: No Typology Code available for payment source

## 2020-02-25 ENCOUNTER — Other Ambulatory Visit: Payer: Self-pay

## 2020-02-25 DIAGNOSIS — M79642 Pain in left hand: Secondary | ICD-10-CM

## 2020-02-25 DIAGNOSIS — Z888 Allergy status to other drugs, medicaments and biological substances status: Secondary | ICD-10-CM | POA: Insufficient documentation

## 2020-02-25 NOTE — Discharge Instructions (Signed)
Use Ace wrap for support and to help reduce pain.  Take Tylenol, alternate using ice and heat.  Please call to schedule follow-up appointment with Dr. Amanda Pea if hand pain is not improving.

## 2020-02-25 NOTE — ED Triage Notes (Signed)
Pt was told to come by PCP to come to ED for hand injury that occurred 1 week ago. Pt had x ray at [facility] showing possible nondisplaced left middle phalanx fracture. Pt states her ring finger feels numb and is concerned swelling is pressing on a nerve.

## 2020-02-25 NOTE — ED Provider Notes (Signed)
[facility]-EMERGENCY DEPT Provider Note   CSN: [id number] Arrival date & time: 02/25/20  0263     History Chief Complaint  Patient presents with   Hand Pain    Sabrina [name] is a 54 y.o. female.  Sabrina [name] is a 54 y.o. female with hx arthritis, anxiety, who presents to the ED for evaluation of left hand injury.  She reports about a week ago she was at work as a Museum/gallery conservator and as she was going out of the operating room the swinging door hit her in the back of the left hand.  She was initially evaluated at [facility] and had an x-ray that she reports she was at first told looked fine but then they said there was a possible displaced left middle phalanx fracture she reports that she was initially put in a metal splint but this started making her hand hurt worse especially over the third MCP joint so she started wearing an Ace wrap instead she reports this seemed to help but she continued to have pain over the area she has been taking Tylenol, is unable to tolerate NSAIDs, came in today because she felt a knot over the dorsum of the hand and noticed some faint bruising.  Reports that ever since the injury she has had some numbness and tingling in her second finger.  Denies any redness or warmth.  No cuts or breaks in the skin.  No other aggravating or alleviating factors.        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HCC)    Poisoning, snake bite 04/08/2016   "[place]; RUE"   PONV (postoperative nausea and vomiting)    S/P right knee surgery    Situational anxiety    Staph infection ~ 2015   "left elbow and finger"    Patient Active Problem List   Diagnosis Date Noted    Pain in left foot 01/21/2020   Primary osteoarthritis of right knee 08/06/2019   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   Other meniscus derangements, posterior horn of medial meniscus, left knee 02/19/2019   Bucket handle tear of lateral meniscus 02/19/2019   Unilateral primary osteoarthritis, right knee 12/13/2018   Panic disorder 12/13/2018   Chronic headaches 12/13/2018   Weakness 09/06/2017   Depression 09/06/2017   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   Generalized anxiety disorder with panic attacks 09/06/2017   Right hand pain    Wrist swelling    Cellulitis of right upper extremity 04/11/2016   Elbow pain 07/20/2015   Anxiety 07/20/2015   Tachycardia 07/04/2015   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HCC)    Skin ulcer of upper arm, limited to breakdown of skin (HCC) 07/01/2015    Past Surgical History:  Procedure Laterality Date   APPENDECTOMY  ~ 1987   APPLICATION OF A-CELL OF EXTREMITY Left 08/05/2015   Procedure: APPLICATION OF A-CELL OF EXTREMITY;  Surgeon: Peggye Form, DO;  Location: [facility];  Service: Plastics;  Laterality: Left;   BREAST SURGERY Right 1990   "milk duct taken out"   CHONDROPLASTY Right 02/19/2019   Procedure: CHONDROPLASTY; EXCISION EXOSTOSIS;  Surgeon: Valeria Batman,  MD;  Location: [place];  Service: Orthopedics;  Laterality: Right;   DEBRIDEMENT AND CLOSURE WOUND Left 07/01/2015   Procedure: LEFT ELBOW EXCISION OF WOUND WITH PRIMARY CLOSURE 2X5 CM ;  Surgeon: Wallace Going, DO;  Location: [place];  Service: Plastics;  Laterality: Left;   ELBOW SURGERY Left X 23 in [place] <06/2015   from a cat bite; all I&D   I & D EXTREMITY Left 07/08/2015   Procedure: IRRIGATION AND DEBRIDEMENT EXTREMITY, DRAINAGE OF LEFT ARM WOUND, A-CELL PLACEMENT, WOUND VAC PLACEMENT;  Surgeon: Loel Lofty Dillingham, DO;  Location: [facility];  Service: Plastics;  Laterality: Left;   INCISION AND DRAINAGE OF WOUND  Left 08/05/2015   Procedure: IRRIGATION AND DEBRIDEMENT LEFT ELBOW WOUND, PLACEMENT OF ACELL;  Surgeon: Wallace Going, DO;  Location: [place];  Service: Plastics;  Laterality: Left;   KNEE ARTHROSCOPY WITH MEDIAL MENISECTOMY Right 02/19/2019   Procedure: RIGHT KNEE ARTHROSCOPY, DEBRIDEMENT, PARTIAL MEDIAL AND LATERAL MENISECTOMY;  Surgeon: Garald Balding, MD;  Location: [place];  Service: Orthopedics;  Laterality: Right;   [place]   SKIN GRAFT Left 2016   took from anterior thigh; placed at elbow   TONSILLECTOMY  ~ [place]  2003   WRIST SURGERY Right 01/2016     OB History   No obstetric history on file.     Family History  Problem Relation Age of Onset   Liver disease Mother    Dementia Mother    Cirrhosis Mother    Prostate cancer Father    Colon cancer Maternal Grandmother    Ovarian cancer Maternal Aunt     Social History   Tobacco Use   Smoking status: Never Smoker   Smokeless tobacco: Never Used  Vaping Use   Vaping Use: Never used  Substance Use Topics   Alcohol use: Never    Alcohol/week: 0.0 standard drinks    Comment: social   Drug use: No    Home Medications Prior to Admission medications   Medication Sig Start Date End Date Taking? Authorizing Provider  butalbital-acetaminophen-caffeine (FIORICET) 50-325-40 MG tablet TAKE 1 TABLET BY MOUTH EVERY 6 HOURS AS NEEDED FOR HEADACHE 02/13/20   Isaac Bliss, Rayford Halsted, MD  diazepam (VALIUM) 10 MG tablet 1  qam   2  qhs 12/25/19   Plovsky, Berneta Sages, MD  gabapentin (NEURONTIN) 300 MG capsule Take 2 capsules (600 mg total) by mouth 3 (three) times daily. 12/25/19   Isaac Bliss, Rayford Halsted, MD  HYDROcodone-homatropine (HYDROMET) 5-1.5 MG/5ML syrup Take 5 mLs by mouth every 4 (four) hours as needed. 02/17/20   Laurey Morale, MD  venlafaxine XR (EFFEXOR-XR) 75 MG 24 hr capsule 1  qam 12/25/19   Norma Fredrickson, MD    Allergies    Zofran Alvis Lemmings hcl], Droperidol, Flexeril [cyclobenzaprine], Morphine and related, and Tessalon [benzonatate]  Review of Systems   Review of Systems  Constitutional: Negative for chills and fever.  Musculoskeletal: Positive for arthralgias and joint swelling.  Skin: Positive for color change. Negative for rash and wound.  Neurological: Positive for numbness. Negative for weakness.    Physical Exam Updated Vital Signs BP (!) 127/94    Pulse 96    Temp 98.4 F (36.9 C) (Oral)    Resp 18    SpO2 100%   Physical Exam Vitals and nursing note reviewed.  Constitutional:      General: She is not  in acute distress.    Appearance: Normal appearance. She is well-developed and normal weight. She is not ill-appearing or diaphoretic.  HENT:     Head: Normocephalic and atraumatic.  Eyes:     General:        Right eye: No discharge.        Left eye: No discharge.  Pulmonary:     Effort: Pulmonary effort is normal. No respiratory distress.  Musculoskeletal:        General: Tenderness present.     Comments: Tenderness to palpation over the dorsum of the left hand where there is faint bruising noted, no significant swelling, no erythema or warmth, there is a palpable just proximal to the third MCP joint that is movable.  There is minimal swelling noted to the middle finger but patient is able to flex and extend the finger.  Patient does report some decreased sensation to the second finger but is able to feel with palpation.  2+ radial pulse and good capillary refill in all fingers.  Skin:    General: Skin is warm and dry.  Neurological:     Mental Status: She is alert and oriented to person, place, and time.     Coordination: Coordination normal.  Psychiatric:        Mood and Affect: Mood normal.        Behavior: Behavior normal.     ED Results / Procedures / Treatments   Labs (all labs ordered are listed, but only abnormal results are displayed) Labs  Reviewed - No data to display  EKG None  Radiology DG Hand Complete Left  Addendum Date: 02/25/2020   ADDENDUM REPORT: 02/25/2020 11:11 ADDENDUM: Subtle added density along the volar aspect of the proximal interphalangeal joint of the long finger may represent remote avulsion injury but is well corticated, significance uncertain. Electronically Signed   By: Donzetta Kohut M.D.   On: 02/25/2020 11:11   Result Date: 02/25/2020 CLINICAL DATA:  LEFT hand injury, suggestion of middle phalangeal fracture EXAM: LEFT HAND - COMPLETE 3+ VIEW COMPARISON:  September 30, 2019 FINDINGS: Joint space narrowing of distal interphalangeal joints of the second and third digits. Osteophyte formation about these joints. No sign of acute fracture or dislocation. No change from the study of September 30, 2019. Soft tissue swelling about the hand is similar to slightly improved. IMPRESSION: Stable to slightly improved soft tissue swelling about the hand. No sign of fracture. Changes of osteoarthritis in the second and third digits as described. Electronically Signed: By: Donzetta Kohut M.D. On: 02/25/2020 11:07    Procedures Procedures (including critical care time)  Medications Ordered in ED Medications - No data to display  ED Course  I have reviewed the triage vital signs and the nursing notes.  Pertinent labs & imaging results that were available during my care of the patient were reviewed by me and considered in my medical decision making (see chart for details).    MDM Rules/Calculators/A&P                          54 year old female presenting with continued hand pain after it was hit with a swinging OR door a week ago was seen initially and was told she may have had a small fracture of the middle phalanx of the third finger she was initially placed in a metal splint but reported that this was making her pain worse so she started using an Ace wrap.  Repeat x-ray here in the ED shows improved soft tissue  swelling with no sign of fracture, there is evidence of osteoarthritis and there is a subtle density along the volar aspect of the proximal interphalangeal joint of the long finger that may represent a remote avulsion injury.  No evidence of infection on exam.  Will place patient back in Ace wrap encouraged her to use intermittent ice and heat as well as Tylenol and follow-up with hand surgery if symptoms or not improving.  Patient expresses understanding and agreement.  Discharged home in good condition.   Final Clinical Impression(s) / ED Diagnoses Final diagnoses:  Left hand pain    Rx / DC Orders ED Discharge Orders    None       Legrand Rams 02/25/20 1207    Raeford Razor, MD 02/28/20 1201

## 2020-02-26 MED FILL — BUTALB-ACETAMIN-CAFF 50-325: 50-325-40 | 2 days supply | Qty: 10 | Fill #2

## 2020-02-27 ENCOUNTER — Telehealth: Payer: Self-pay | Admitting: Orthopaedic Surgery

## 2020-02-27 NOTE — Telephone Encounter (Signed)
Left message on patient's voicemail in regards to scheduling right total knee with [name].  Also waiting on clearance from PCP.  It is noted on surgery sheet that patient was given clearance form to take to her doctor.

## 2020-03-06 MED FILL — BUTALB-ACETAMIN-CAFF 50-325: 50-325-40 | 2 days supply | Qty: 10 | Fill #3

## 2020-03-10 MED FILL — DIAZEPAM 10 MG TABS: 10 | 30 days supply | Qty: 90 | Fill #3

## 2020-03-11 ENCOUNTER — Encounter: Payer: Self-pay | Admitting: Internal Medicine

## 2020-03-11 ENCOUNTER — Other Ambulatory Visit: Payer: Self-pay | Admitting: Internal Medicine

## 2020-03-11 ENCOUNTER — Telehealth: Payer: Self-pay | Admitting: Internal Medicine

## 2020-03-11 DIAGNOSIS — G629 Polyneuropathy, unspecified: Secondary | ICD-10-CM

## 2020-03-11 NOTE — Telephone Encounter (Signed)
Pt would like to know if it's okay, for her to receive her covid vaccine? Pt has a few health issues and would like to speak to someone before getting the vaccine. Thanks

## 2020-03-12 NOTE — Telephone Encounter (Signed)
Left detailed message on machine for patient that Dr Ardyth Harps has advised that all her patient's should get a Covid vaccine.

## 2020-03-16 ENCOUNTER — Other Ambulatory Visit: Payer: Self-pay | Admitting: Internal Medicine

## 2020-03-16 DIAGNOSIS — G629 Polyneuropathy, unspecified: Secondary | ICD-10-CM

## 2020-03-18 ENCOUNTER — Other Ambulatory Visit: Payer: Self-pay | Admitting: Internal Medicine

## 2020-03-18 ENCOUNTER — Encounter: Payer: Self-pay | Admitting: Internal Medicine

## 2020-03-18 DIAGNOSIS — G629 Polyneuropathy, unspecified: Secondary | ICD-10-CM

## 2020-03-18 MED ORDER — GABAPENTIN 300 MG PO CAPS: 600.0000 mg | ORAL_CAPSULE | Freq: Three times a day (TID) | ORAL | 1 refills | Status: DC

## 2020-03-18 MED FILL — GABAPENTIN 300 MG CAPSULE: 300 | 90 days supply | Qty: 540 | Fill #0

## 2020-03-20 ENCOUNTER — Other Ambulatory Visit: Payer: Self-pay

## 2020-03-20 ENCOUNTER — Other Ambulatory Visit (INDEPENDENT_AMBULATORY_CARE_PROVIDER_SITE_OTHER): Payer: Self-pay

## 2020-03-20 DIAGNOSIS — Z0000 Encounter for general adult medical examination without abnormal findings: Secondary | ICD-10-CM

## 2020-03-20 NOTE — Addendum Note (Signed)
Addended by: Lerry Liner on: 03/20/2020 11:32 AM   Modules accepted: Orders

## 2020-03-21 LAB — CBC WITH DIFFERENTIAL/PLATELET
Absolute Monocytes: 278 cells/uL (ref 200–950)
Basophils Absolute: 9 cells/uL (ref 0–200)
Basophils Relative: 0.3 %
Eosinophils Absolute: 78 cells/uL (ref 15–500)
Eosinophils Relative: 2.7 %
HCT: 37.5 % (ref 35.0–45.0)
Hemoglobin: 12.2 g/dL (ref 11.7–15.5)
Lymphs Abs: 1192 cells/uL (ref 850–3900)
MCH: 31 pg (ref 27.0–33.0)
MCHC: 32.5 g/dL (ref 32.0–36.0)
MCV: 95.4 fL (ref 80.0–100.0)
MPV: 11.2 fL (ref 7.5–12.5)
Monocytes Relative: 9.6 %
Neutro Abs: 1343 cells/uL — ABNORMAL LOW (ref 1500–7800)
Neutrophils Relative %: 46.3 %
Platelets: 238 10*3/uL (ref 140–400)
RBC: 3.93 10*6/uL (ref 3.80–5.10)
RDW: 13.6 % (ref 11.0–15.0)
Total Lymphocyte: 41.1 %
WBC: 2.9 10*3/uL — ABNORMAL LOW (ref 3.8–10.8)

## 2020-03-21 LAB — LIPID PANEL
Cholesterol: 267 mg/dL — ABNORMAL HIGH (ref <200)
HDL: 67 mg/dL (ref >=50)
LDL Cholesterol (Calc): 182 mg/dL (calc) — ABNORMAL HIGH
Non-HDL Cholesterol (Calc): 200 mg/dL (calc) — ABNORMAL HIGH (ref <130)
Total CHOL/HDL Ratio: 4 (calc) (ref <5.0)
Triglycerides: 76 mg/dL (ref <150)

## 2020-03-21 LAB — COMPREHENSIVE METABOLIC PANEL
AG Ratio: 1.7 (calc) (ref 1.0–2.5)
ALT: 10 U/L (ref 6–29)
AST: 15 U/L (ref 10–35)
Albumin: 4.3 g/dL (ref 3.6–5.1)
Alkaline phosphatase (APISO): 66 U/L (ref 37–153)
BUN: 21 mg/dL (ref 7–25)
CO2: 25 mmol/L (ref 20–32)
Calcium: 9.4 mg/dL (ref 8.6–10.4)
Chloride: 106 mmol/L (ref 98–110)
Creat: 0.87 mg/dL (ref 0.50–1.05)
Globulin: 2.5 g/dL (calc) (ref 1.9–3.7)
Glucose, Bld: 96 mg/dL (ref 65–99)
Potassium: 4.4 mmol/L (ref 3.5–5.3)
Sodium: 139 mmol/L (ref 135–146)
Total Bilirubin: 0.4 mg/dL (ref 0.2–1.2)
Total Protein: 6.8 g/dL (ref 6.1–8.1)

## 2020-03-21 LAB — HEMOGLOBIN A1C
Hgb A1c MFr Bld: 5.4 % of total Hgb (ref <5.7)
Mean Plasma Glucose: 108 (calc)
eAG (mmol/L): 6 (calc)

## 2020-03-21 LAB — TSH: TSH: 1.52 mIU/L

## 2020-03-21 LAB — VITAMIN B12: Vitamin B-12: >2000 pg/mL — ABNORMAL HIGH (ref 200–1100)

## 2020-03-21 LAB — VITAMIN D 25 HYDROXY (VIT D DEFICIENCY, FRACTURES): Vit D, 25-Hydroxy: 15 ng/mL — ABNORMAL LOW (ref 30–100)

## 2020-03-24 ENCOUNTER — Encounter: Payer: Self-pay | Admitting: Internal Medicine

## 2020-03-24 ENCOUNTER — Other Ambulatory Visit (HOSPITAL_COMMUNITY): Payer: Self-pay | Admitting: Psychiatry

## 2020-03-24 ENCOUNTER — Other Ambulatory Visit: Payer: Self-pay | Admitting: Internal Medicine

## 2020-03-24 DIAGNOSIS — E559 Vitamin D deficiency, unspecified: Secondary | ICD-10-CM | POA: Insufficient documentation

## 2020-03-24 DIAGNOSIS — E785 Hyperlipidemia, unspecified: Secondary | ICD-10-CM | POA: Insufficient documentation

## 2020-03-24 MED ORDER — ATORVASTATIN CALCIUM 20 MG PO TABS: 20.0000 mg | ORAL_TABLET | Freq: Every day | ORAL | 1 refills | Status: DC

## 2020-03-24 MED ORDER — VITAMIN D (ERGOCALCIFEROL) 1.25 MG (50000 UNIT) PO CAPS: 50000.0000 [IU] | ORAL_CAPSULE | ORAL | 0 refills | Status: AC

## 2020-03-24 MED FILL — ATORVASTATIN 20 MG TABLET: 20 | 90 days supply | Qty: 90 | Fill #0

## 2020-03-24 MED FILL — VIT D2 1.25 MG (50,000 UNIT: 1.25 MG | 84 days supply | Qty: 12 | Fill #0

## 2020-03-24 MED FILL — VENLAFAXINE HCL ER 75 MG CA: 75 | 90 days supply | Qty: 90 | Fill #1

## 2020-03-25 ENCOUNTER — Other Ambulatory Visit: Payer: Self-pay | Admitting: Internal Medicine

## 2020-03-25 ENCOUNTER — Encounter: Payer: Self-pay | Admitting: Internal Medicine

## 2020-03-25 DIAGNOSIS — E559 Vitamin D deficiency, unspecified: Secondary | ICD-10-CM

## 2020-03-25 DIAGNOSIS — E785 Hyperlipidemia, unspecified: Secondary | ICD-10-CM

## 2020-04-06 ENCOUNTER — Other Ambulatory Visit (HOSPITAL_COMMUNITY): Payer: Self-pay | Admitting: *Deleted

## 2020-04-06 MED ORDER — DIAZEPAM 10 MG PO TABS: ORAL_TABLET | ORAL | 0 refills | Status: DC

## 2020-04-07 MED FILL — DIAZEPAM 10 MG TABS: 10 | 30 days supply | Qty: 90 | Fill #0

## 2020-04-15 ENCOUNTER — Telehealth: Payer: Self-pay | Admitting: Internal Medicine

## 2020-04-15 NOTE — Telephone Encounter (Signed)
Will watch for mychart message.

## 2020-04-15 NOTE — Telephone Encounter (Signed)
Pt call and stated she have her assistant and will sent you a mychart message.

## 2020-04-29 ENCOUNTER — Telehealth (HOSPITAL_COMMUNITY): Payer: Self-pay | Admitting: Psychiatry

## 2020-05-07 ENCOUNTER — Telehealth (HOSPITAL_COMMUNITY): Payer: Self-pay | Admitting: Psychiatry

## 2020-05-07 ENCOUNTER — Other Ambulatory Visit: Payer: Self-pay

## 2020-05-07 ENCOUNTER — Telehealth (INDEPENDENT_AMBULATORY_CARE_PROVIDER_SITE_OTHER): Payer: Self-pay | Admitting: Psychiatry

## 2020-05-07 ENCOUNTER — Other Ambulatory Visit (HOSPITAL_COMMUNITY): Payer: Self-pay | Admitting: Psychiatry

## 2020-05-07 DIAGNOSIS — F410 Panic disorder [episodic paroxysmal anxiety] without agoraphobia: Secondary | ICD-10-CM

## 2020-05-07 MED ORDER — VENLAFAXINE HCL ER 75 MG PO CP24: ORAL_CAPSULE | ORAL | 1 refills | Status: DC

## 2020-05-07 MED ORDER — DIAZEPAM 10 MG PO TABS: ORAL_TABLET | ORAL | 5 refills | Status: DC

## 2020-05-07 MED FILL — DIAZEPAM 10 MG TABS: 10 | 30 days supply | Qty: 90 | Fill #0

## 2020-05-07 NOTE — Progress Notes (Signed)
BH MD/PA/NP OP Progress Note  05/07/2020 2:17 PM Sabrina [name]  MRN:  [id number]  Chief Complaint:    This patient has been under my care being treated for panic disorder and major depression.  Today she is doing great.  She has changed jobs.  She now has a great job working in a [employer] [place]  She loves her job.  She also is rehab her pitbull dog who is 52 years old.  She loves spending time with him.  She also goes and rides horses.  She is sleeping and eating great.  Her health is good.  She not having any more dermatological problems.  She has some problem with her knees and she is resistant to getting knee replacement at this time.  She is not in any relationships at this time.  He plans to go to [place] to visit her daughter and granddaughter.  Her granddaughter is 637 years old.  It is her only grandchild.  Patient is enjoying life.  She is exercising.  She is walking now with her dog.  She has had no panic episodes.  Her moods are stable.  She is not in any psychotherapy.  Patient has never been psychotic.  She denies use of alcohol or drugs.  She is functioning extremely well.  Past Psychiatric History: See intake H&P for full details. Reviewed, with no updates at this time.   Past Medical History:  Past Medical History:  Diagnosis Date  . Arthritis    "knees" (09/06/2017)  . Bullous pemphigus   . Cat bite 06/2014   to left elbow  . History of blood transfusion 1988   "when I had my baby"  . Muscle weakness of lower extremity 2001; 09/05/2017   "resolved after a couple weeks; ?" (09/06/2017)  . 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 Poisoning, snake bite 04/08/2016   "[place]; RUE"  . PONV (postoperative nausea and vomiting)   . S/P right knee surgery   . Situational anxiety   . Staph infection ~ 2015   "left elbow and finger"    Past Surgical History:  Procedure Laterality Date  . APPENDECTOMY  ~ 1987  . APPLICATION OF A-CELL OF EXTREMITY Left 08/05/2015    Procedure: APPLICATION OF A-CELL OF EXTREMITY;  Surgeon: Peggye Formlaire S Dillingham, DO;  Location: [facility];  Service: Plastics;  Laterality: Left;  . BREAST SURGERY Right 1990   "milk duct taken out"  . CHONDROPLASTY Right 02/19/2019   Procedure: CHONDROPLASTY; EXCISION EXOSTOSIS;  Surgeon: Valeria BatmanWhitfield, Peter W, MD;  Location: [facility];  Service: Orthopedics;  Laterality: Right;  . DEBRIDEMENT AND CLOSURE WOUND Left 07/01/2015   Procedure: LEFT ELBOW EXCISION OF WOUND WITH PRIMARY CLOSURE 2X5 CM ;  Surgeon: Peggye Formlaire S Dillingham, DO;  Location: [facility];  Service: Plastics;  Laterality: Left;  . ELBOW SURGERY Left X 23 in [place] <06/2015   from a cat bite; all I&D  . I & D EXTREMITY Left 07/08/2015   Procedure: IRRIGATION AND DEBRIDEMENT EXTREMITY, DRAINAGE OF LEFT ARM WOUND, A-CELL PLACEMENT, WOUND VAC PLACEMENT;  Surgeon: Alena Billslaire S Dillingham, DO;  Location: [facility];  Service: Plastics;  Laterality: Left;  . INCISION AND DRAINAGE OF WOUND Left 08/05/2015   Procedure: IRRIGATION AND DEBRIDEMENT LEFT ELBOW WOUND, PLACEMENT OF ACELL;  Surgeon: Peggye Formlaire S Dillingham, DO;  Location: [facility];  Service: Plastics;  Laterality: Left;  . KNEE ARTHROSCOPY WITH MEDIAL MENISECTOMY Right 02/19/2019  Procedure: RIGHT KNEE ARTHROSCOPY, DEBRIDEMENT, PARTIAL MEDIAL AND LATERAL MENISECTOMY;  Surgeon: Valeria Batman, MD;  Location: [facility];  Service: Orthopedics;  Laterality: Right;  . LAPAROSCOPIC CHOLECYSTECTOMY  1998  . SKIN GRAFT Left 2016   took from anterior thigh; placed at elbow  . TONSILLECTOMY  ~ 2000  . TOTAL ABDOMINAL HYSTERECTOMY  2003  . WRIST SURGERY Right 01/2016    Family Psychiatric History: See intake H&P for full details. Reviewed, with no updates at this time.   Family History:  Family History  Problem Relation Age of Onset  . Liver disease Mother   . Dementia Mother   . Cirrhosis Mother   . Prostate  cancer Father   . Colon cancer Maternal Grandmother   . Ovarian cancer Maternal Aunt     Social History:  Social History   Socioeconomic History  . Marital status: Divorced    Spouse name: Not on file  . Number of children: 1  . Years of education: Not on file  . Highest education level: Associate degree: academic program  Occupational History  . Occupation: unemployed  Tobacco Use  . Smoking status: Never Smoker  . Smokeless tobacco: Never Used  Vaping Use  . Vaping Use: Never used  Substance and Sexual Activity  . Alcohol use: Never    Alcohol/week: 0.0 standard drinks    Comment: social  . Drug use: No  . Sexual activity: Not Currently  Other Topics Concern  . Not on file  Social History Narrative   Pateint is right-handed. She lives alone in a single level home. She rarely drinks caffeine. She is limited to exercise due to knee injuries.   Social Determinants of Health   Financial Resource Strain:   . Difficulty of Paying Living Expenses: Not on file  Food Insecurity:   . Worried About Programme researcher, broadcasting/film/video in the Last Year: Not on file  . Ran Out of Food in the Last Year: Not on file  Transportation Needs:   . Lack of Transportation (Medical): Not on file  . Lack of Transportation (Non-Medical): Not on file  Physical Activity:   . Days of Exercise per Week: Not on file  . Minutes of Exercise per Session: Not on file  Stress:   . Feeling of Stress : Not on file  Social Connections:   . Frequency of Communication with Friends and Family: Not on file  . Frequency of Social Gatherings with Friends and Family: Not on file  . Attends Religious Services: Not on file  . Active Member of Clubs or Organizations: Not on file  . Attends Banker Meetings: Not on file  . Marital Status: Not on file    Allergies:  Allergies  Allergen Reactions  . Zofran [Ondansetron Hcl] Hives and Other (See Comments)    Spots around iv site  . Droperidol Palpitations  .  Flexeril [Cyclobenzaprine] Anxiety  . Morphine And Related Itching  . Tessalon [Benzonatate] Other (See Comments)    Conjunctivitis, watery eyes.    Metabolic Disorder Labs: Lab Results  Component Value Date   HGBA1C 5.4 03/20/2020   MPG 108 03/20/2020   MPG  07/05/2015    QUESTIONABLE IDENTIFICATION / INCORRECTLY LABELED SPECIMEN   No results found for: PROLACTIN Lab Results  Component Value Date   CHOL 267 (H) 03/20/2020   TRIG 76 03/20/2020   HDL 67 03/20/2020   CHOLHDL 4.0 03/20/2020   VLDL 29 04/27/2015   LDLCALC 182 (H)  03/20/2020   LDLCALC 122 (H) 06/18/2018   Lab Results  Component Value Date   TSH 1.52 03/20/2020   TSH 2.890 06/18/2018    Therapeutic Level Labs: No results found for: LITHIUM No results found for: VALPROATE No components found for:  CBMZ  Current Medications: Current Outpatient Medications  Medication Sig Dispense Refill  . atorvastatin (LIPITOR) 20 MG tablet Take 1 tablet (20 mg total) by mouth daily. 90 tablet 1  . butalbital-acetaminophen-caffeine (FIORICET) 50-325-40 MG tablet TAKE 1 TABLET BY MOUTH EVERY 6 HOURS AS NEEDED FOR HEADACHE 10 tablet 3  . diazepam (VALIUM) 10 MG tablet 1  qam   2  qhs 90 tablet 5  . gabapentin (NEURONTIN) 300 MG capsule Take 2 capsules (600 mg total) by mouth 3 (three) times daily. 540 capsule 1  . HYDROcodone-homatropine (HYDROMET) 5-1.5 MG/5ML syrup Take 5 mLs by mouth every 4 (four) hours as needed. 240 mL 0  . venlafaxine XR (EFFEXOR-XR) 75 MG 24 hr capsule 1  qam 90 capsule 1  . Vitamin D, Ergocalciferol, (DRISDOL) 1.25 MG (50000 UNIT) CAPS capsule Take 1 capsule (50,000 Units total) by mouth every 7 (seven) days for 12 doses. 12 capsule 0   No current facility-administered medications for this visit.     Musculoskeletal: Strength & Muscle Tone: within normal limits Gait & Station: normal Patient leans: N/A  Psychiatric Specialty Exam: ROS  There were no vitals taken for this visit.There is no  height or weight on file to calculate BMI.  General Appearance: Casual and Well Groomed  Eye Contact:  Good  Speech:  Clear and Coherent  Volume:  Normal  Mood:  Euthymic  Affect:  Congruent  Thought Process:  Goal Directed and Descriptions of Associations: Intact  Orientation:  Full (Time, Place, and Person)  Thought Content: Logical   Suicidal Thoughts:  No  Homicidal Thoughts:  No  Memory:  Immediate;   Fair  Judgement:  Fair  Insight:  Fair  Psychomotor Activity:  Normal  Concentration:  Concentration: Good  Recall:  Good  Fund of Knowledge: Good  Language: Good  Akathisia:  Negative  Handed:  Right  AIMS (if indicated): not done  Assets:  Communication Skills Desire for Improvement Housing Transportation  ADL's:  Intact  Cognition: WNL  Sleep:  Fair   Screenings: PHQ2-9     Office Visit from 01/24/2020 in [facility] Video Visit from 12/25/2019 in [facility] [employer] [facility] from 09/21/2018 in [facility] Office Visit from 06/25/2018 in [facility] Office Visit from 06/18/2018 in [facility]  PHQ-2 Total Score 2 0 1 0 0  PHQ-9 Total Score 5 2 -- -- --       Assessment and Plan:   This patient has 2 problems.  First is that of major depression.  At this time her mood is great and she has no vegetative symptoms.  She will continue taking Effexor 75 mg in the morning.  Her second problem is panic disorder.  The patient takes a relatively high dose of Valium 10 mg in the morning and 20 mg at night.  Her panic disorder is absent at this time.  She is doing great.  She is had no falls.  She denies being oversedated.  She takes all her medicines just as prescribed.  She does not drink any alcohol or use any drugs.  This patient will be seen again in 5 months.  Status of current problems: gradually improving  Labs Ordered: No orders of the defined types were placed in this  encounter.   Labs Reviewed: n/a  Collateral Obtained/Records Reviewed: n/a  Plan:  Continue Lexapro 20 mg daily Increase Seroquel to 200 mg nightly Return to clinic in 3-4 months, transfer care to Dr. Liz Beach, MD 05/07/2020, 2:17 PM

## 2020-05-12 ENCOUNTER — Encounter: Payer: Self-pay | Admitting: Internal Medicine

## 2020-05-12 ENCOUNTER — Other Ambulatory Visit: Payer: Self-pay | Admitting: Internal Medicine

## 2020-05-12 DIAGNOSIS — Z1231 Encounter for screening mammogram for malignant neoplasm of breast: Secondary | ICD-10-CM

## 2020-05-12 DIAGNOSIS — G8929 Other chronic pain: Secondary | ICD-10-CM

## 2020-05-12 DIAGNOSIS — Z1239 Encounter for other screening for malignant neoplasm of breast: Secondary | ICD-10-CM

## 2020-05-12 DIAGNOSIS — Z1211 Encounter for screening for malignant neoplasm of colon: Secondary | ICD-10-CM

## 2020-05-13 MED FILL — BUTALB-ACETAMIN-CAFF 50-325: 50-325-40 | 2 days supply | Qty: 10 | Fill #0

## 2020-05-21 MED FILL — BUTALB-ACETAMIN-CAFF 50-325: 50-325-40 | 2 days supply | Qty: 10 | Fill #1

## 2020-05-26 ENCOUNTER — Telehealth (INDEPENDENT_AMBULATORY_CARE_PROVIDER_SITE_OTHER): Payer: Self-pay | Admitting: Family Medicine

## 2020-05-26 DIAGNOSIS — R112 Nausea with vomiting, unspecified: Secondary | ICD-10-CM

## 2020-05-26 NOTE — Progress Notes (Signed)
Virtual Visit via Video Note  I connected with Sabrina [name]  on 05/26/20 at 12:40 PM EDT by a video enabled telemedicine application and verified that I am speaking with the correct person using two identifiers.  Location patient: home, [place] Location provider:work or home office Persons participating in the virtual visit: patient, provider  I discussed the limitations of evaluation and management by telemedicine and the availability of in person appointments. The patient expressed understanding and agreed to proceed.    HPI:  Acute visit for nausea and vomiting: -started saturday -symptoms included nausea, vomiting several times last night (nonbloody, no bilious) -several folks at work had a stomach bug this past week and tested negative for COVID19 -denies fever, diarrhea, cough, congestion, melena, hematochezia, inability to tolerate fluids, abd pain -Temp is 98.7 -vaccinated for COVID19, moderna, 2nd dose in July   ROS: See pertinent positives and negatives per HPI.  Past Medical History:  Diagnosis Date  . Arthritis    "knees" (09/06/2017)  . Bullous pemphigus   . Cat bite 06/2014   to left elbow  . History of blood transfusion 1988   "when I had my baby"  . Muscle weakness of lower extremity 2001; 09/05/2017   "resolved after a couple weeks; ?" (09/06/2017)  . 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 Poisoning, snake bite 04/08/2016   "[place]; RUE"  . PONV (postoperative nausea and vomiting)   . S/P right knee surgery   . Situational anxiety   . Staph infection ~ 2015   "left elbow and finger"    Past Surgical History:  Procedure Laterality Date  . APPENDECTOMY  ~ 1987  . APPLICATION OF A-CELL OF EXTREMITY Left 08/05/2015   Procedure: APPLICATION OF A-CELL OF EXTREMITY;  Surgeon: Peggye Form, DO;  Location: [facility];  Service: Plastics;  Laterality: Left;  . BREAST SURGERY Right 1990   "milk duct taken out"  . CHONDROPLASTY Right 02/19/2019    Procedure: CHONDROPLASTY; EXCISION EXOSTOSIS;  Surgeon: Valeria Batman, MD;  Location: [facility];  Service: Orthopedics;  Laterality: Right;  . DEBRIDEMENT AND CLOSURE WOUND Left 07/01/2015   Procedure: LEFT ELBOW EXCISION OF WOUND WITH PRIMARY CLOSURE 2X5 CM ;  Surgeon: Peggye Form, DO;  Location: [facility];  Service: Plastics;  Laterality: Left;  . ELBOW SURGERY Left X 23 in [place] <06/2015   from a cat bite; all I&D  . I & D EXTREMITY Left 07/08/2015   Procedure: IRRIGATION AND DEBRIDEMENT EXTREMITY, DRAINAGE OF LEFT ARM WOUND, A-CELL PLACEMENT, WOUND VAC PLACEMENT;  Surgeon: Alena Bills Dillingham, DO;  Location: [facility];  Service: Plastics;  Laterality: Left;  . INCISION AND DRAINAGE OF WOUND Left 08/05/2015   Procedure: IRRIGATION AND DEBRIDEMENT LEFT ELBOW WOUND, PLACEMENT OF ACELL;  Surgeon: Peggye Form, DO;  Location: [facility];  Service: Plastics;  Laterality: Left;  . KNEE ARTHROSCOPY WITH MEDIAL MENISECTOMY Right 02/19/2019   Procedure: RIGHT KNEE ARTHROSCOPY, DEBRIDEMENT, PARTIAL MEDIAL AND LATERAL MENISECTOMY;  Surgeon: Valeria Batman, MD;  Location: [facility];  Service: Orthopedics;  Laterality: Right;  . LAPAROSCOPIC CHOLECYSTECTOMY  1998  . SKIN GRAFT Left 2016   took from anterior thigh; placed at elbow  . TONSILLECTOMY  ~ 2000  . TOTAL ABDOMINAL HYSTERECTOMY  2003  . WRIST SURGERY Right 01/2016    Family History  Problem Relation Age of Onset  . Liver disease Mother   . Dementia Mother   . Cirrhosis  Mother   . Prostate cancer Father   . Colon cancer Maternal Grandmother   . Ovarian cancer Maternal Aunt     SOCIAL HX: see hpi   Current Outpatient Medications:  .  atorvastatin (LIPITOR) 20 MG tablet, Take 1 tablet (20 mg total) by mouth daily., Disp: 90 tablet, Rfl: 1 .  butalbital-acetaminophen-caffeine (FIORICET) 50-325-40 MG tablet, TAKE 1 TABLET BY MOUTH EVERY 6 HOURS AS NEEDED  FOR HEADACHE, Disp: 10 tablet, Rfl: 1 .  diazepam (VALIUM) 10 MG tablet, 1  qam   2  qhs, Disp: 90 tablet, Rfl: 5 .  gabapentin (NEURONTIN) 300 MG capsule, Take 2 capsules (600 mg total) by mouth 3 (three) times daily., Disp: 540 capsule, Rfl: 1 .  HYDROcodone-homatropine (HYDROMET) 5-1.5 MG/5ML syrup, Take 5 mLs by mouth every 4 (four) hours as needed., Disp: 240 mL, Rfl: 0 .  venlafaxine XR (EFFEXOR-XR) 75 MG 24 hr capsule, 1  qam, Disp: 90 capsule, Rfl: 1 .  Vitamin D, Ergocalciferol, (DRISDOL) 1.25 MG (50000 UNIT) CAPS capsule, Take 1 capsule (50,000 Units total) by mouth every 7 (seven) days for 12 doses., Disp: 12 capsule, Rfl: 0  EXAM:  VITALS per patient if applicable: denies fever, see hpi  GENERAL: alert, oriented, appears well and in no acute distress  HEENT: atraumatic, conjunttiva clear, no obvious abnormalities on inspection of external nose and ears  NECK: normal movements of the head and neck  LUNGS: on inspection no signs of respiratory distress, breathing rate appears normal, no obvious gross SOB, gasping or wheezing  CV: no obvious cyanosis  MS: moves all visible extremities without noticeable abnormality  PSYCH/NEURO: pleasant and cooperative, no obvious depression or anxiety, speech and thought processing grossly intact  ASSESSMENT AND PLAN:  Discussed the following assessment and plan:  Nausea and vomiting, intractability of vomiting not specified, unspecified vomiting type  -we discussed possible serious and likely etiologies, options for evaluation and workup, limitations of telemedicine visit vs in person visit, treatment, treatment risks and precautions. Pt prefers to treat via telemedicine empirically rather then risking or undertaking an in person visit at this moment. Suspect viral gastroenteritis given recent exposure. Did advise staying home while sick and [phone] testing out of abundance of caution to ensure not that given she is around folks at work and  some cases start with GI symptoms, however feel that is less likely. Symptomatic care and oral hydration advised. Precautions discussed. She declined antiemetic as is doing better today. Work/School slipped offered:provided in patient insturcitons Advised to seek prompt follow up telemedicine visit or in person care if worsening, new symptoms arise, or if is not improving with treatment.     I discussed the assessment and treatment plan with the patient. > 20 minutes spent on this visit. The patient was provided an opportunity to ask questions and all were answered. The patient agreed with the plan and demonstrated an understanding of the instructions.    Terressa Koyanagi, DO

## 2020-05-26 NOTE — Patient Instructions (Addendum)
  WORK SLIP:  Patient Sabrina [name],  [birth year], was seen for a medical visit today, 05/26/2020. Please excuse from work according to the [facility] guidelines for a COVID like illness. We advise 10 days minimum from the onset of symptoms (05/23/2020) PLUS 1 day of no fever and improved symptoms. Will defer to employer for a sooner return to work if COVID19 testing is negative and the symptoms have resolved for 24-48 hours. Advise following CDC guidelines.    Sincerely: E-signature: Dr. Kriste Basque, DO [facility] Ph: [phone]    -------------------------------------------------------------------------------------------------------------   HOME CARE TIPS:  -stay home while sick, and if you have COVID19 please stay home for a full 10 days since the onset of symptoms PLUS one day of no fever and feeling better  -[place] COVID19 testing information: [email or web address] OR [phone]   -stay hydrated, drink plenty of fluids and eat small healthy meals - avoid dairy for 1 week  -follow up with your doctor in 2-3 days unless improving and feeling better  I hope you are feeling better soon! Seek in-person care or a follow up telemedicine visit promptly if your symptoms worsen, new concerns arise or you are not improving as expected. Call 911 if severe symptoms.

## 2020-05-27 ENCOUNTER — Other Ambulatory Visit: Payer: Self-pay

## 2020-06-03 MED FILL — DIAZEPAM 10 MG TABS: 10 | 30 days supply | Qty: 90 | Fill #1

## 2020-06-09 ENCOUNTER — Encounter: Payer: Self-pay | Admitting: Internal Medicine

## 2020-06-09 ENCOUNTER — Other Ambulatory Visit: Payer: Self-pay | Admitting: Internal Medicine

## 2020-06-09 DIAGNOSIS — Z1239 Encounter for other screening for malignant neoplasm of breast: Secondary | ICD-10-CM

## 2020-06-09 DIAGNOSIS — R519 Headache, unspecified: Secondary | ICD-10-CM

## 2020-06-09 MED ORDER — BUTALBITAL-APAP-CAFFEINE 50-325-40 MG PO TABS: 1.0000 | ORAL_TABLET | Freq: Four times a day (QID) | ORAL | 1 refills | Status: DC | PRN

## 2020-06-10 MED FILL — BUTALB-ACETAMIN-CAFF 50-325: 50-325-40 | 2 days supply | Qty: 10 | Fill #0

## 2020-06-16 ENCOUNTER — Ambulatory Visit (INDEPENDENT_AMBULATORY_CARE_PROVIDER_SITE_OTHER): Payer: Self-pay | Admitting: Orthopaedic Surgery

## 2020-06-16 ENCOUNTER — Encounter: Payer: Self-pay | Admitting: Orthopaedic Surgery

## 2020-06-16 ENCOUNTER — Other Ambulatory Visit: Payer: Self-pay

## 2020-06-16 ENCOUNTER — Ambulatory Visit (INDEPENDENT_AMBULATORY_CARE_PROVIDER_SITE_OTHER): Payer: Self-pay

## 2020-06-16 VITALS — Ht 64.0 in | Wt 170.0 lb

## 2020-06-16 DIAGNOSIS — M25562 Pain in left knee: Secondary | ICD-10-CM

## 2020-06-16 DIAGNOSIS — M1712 Unilateral primary osteoarthritis, left knee: Secondary | ICD-10-CM

## 2020-06-16 DIAGNOSIS — M2342 Loose body in knee, left knee: Secondary | ICD-10-CM

## 2020-06-16 DIAGNOSIS — M25462 Effusion, left knee: Secondary | ICD-10-CM

## 2020-06-16 MED ORDER — BUPIVACAINE HCL 0.25 % IJ SOLN
2.0000 mL | INTRAMUSCULAR | Status: AC | PRN
  Administered 2020-06-16: 2 mL via INTRA_ARTICULAR

## 2020-06-16 MED ORDER — METHYLPREDNISOLONE ACETATE 40 MG/ML IJ SUSP
80.0000 mg | INTRAMUSCULAR | Status: AC | PRN
  Administered 2020-06-16: 80 mg via INTRA_ARTICULAR

## 2020-06-16 MED ORDER — LIDOCAINE HCL 1 % IJ SOLN
3.0000 mL | INTRAMUSCULAR | Status: AC | PRN
  Administered 2020-06-16: 3 mL

## 2020-06-16 MED FILL — BUTALB-ACETAMIN-CAFF 50-325: 50-325-40 | 2 days supply | Qty: 10 | Fill #1

## 2020-06-16 NOTE — Progress Notes (Signed)
Office Visit Note   Patient: Sabrina [name]           Date of Birth: 07-18-1966           MRN: [id number] Visit Date: 06/16/2020              Requested by: Philip Aspen, Limmie Patricia, MD [address] [place],  [place] [address] PCP: Philip Aspen, Limmie Patricia, MD   Assessment & Plan: Visit Diagnoses:  1. Acute pain of left knee   2. Primary osteoarthritis of left knee   3. Effusion, left knee   4. Loose body in knee, left knee     Plan:  #1: Aspiration and injection of the left knee was performed without difficulty. #2: She will use Voltaren gel as an anti-inflammatory to the knee #3: If symptoms worsen then possible joint replacement would be in her future.  Follow-Up Instructions: Return if symptoms worsen or fail to improve.   Orders:  Orders Placed This Encounter  Procedures  . Large Joint Inj: L knee  . XR KNEE 3 VIEW LEFT   No orders of the defined types were placed in this encounter.  Face-to-face time spent with patient was greater than 30 minutes.  Greater than 50% of the time was spent in counseling and coordination of care.      Procedures: Large Joint Inj: L knee on 06/16/2020 2:51 PM Indications: pain and diagnostic evaluation Details: 18 G 1.5 in needle, anteromedial approach  Arthrogram: No  Medications: 80 mg methylPREDNISolone acetate 40 MG/ML; 2 mL bupivacaine 0.25 %; 3 mL lidocaine 1 % Aspirate: 35 mL yellow and blood-tinged Outcome: tolerated well, no immediate complications Procedure, treatment alternatives, risks and benefits explained, specific risks discussed. Consent was given by the patient. Immediately prior to procedure a time out was called to verify the correct patient, procedure, equipment, support staff and site/side marked as required. Patient was prepped and draped in the usual sterile fashion.       Clinical Data: No additional findings.   Subjective: Chief Complaint  Patient presents with  . Left Knee - Pain    HPI Patient presents today for left knee pain.  She said that her knee started to hurt about two weeks ago. No known injury. Her pain is worse medially and superiorly. Swollen. She said that it hurts constantly, but worse with weightbearing. She has been applying Voltaren gel and taking Tylenol. She is wanting to get a cortisone injection today.    Review of Systems  Constitutional: Negative for fatigue.  HENT: Negative for ear pain.   Eyes: Negative for pain.  Respiratory: Negative for shortness of breath.   Cardiovascular: Negative for leg swelling.  Gastrointestinal: Negative for constipation and diarrhea.  Endocrine: Negative for cold intolerance and heat intolerance.  Genitourinary: Negative for difficulty urinating.  Musculoskeletal: Positive for joint swelling.  Skin: Negative for rash.  Allergic/Immunologic: Negative for food allergies.  Neurological: Negative for weakness.  Hematological: Does not bruise/bleed easily.  Psychiatric/Behavioral: Negative for sleep disturbance.     Objective: Vital Signs: Ht 5\' 4"  (1.626 m)   Wt 170 lb (77.1 kg)   BMI 29.18 kg/m   Physical Exam Constitutional:      Appearance: Normal appearance. She is well-developed.  HENT:     Head: Normocephalic.  Eyes:     Pupils: Pupils are equal, round, and reactive to light.  Pulmonary:     Effort: Pulmonary effort is normal.  Skin:    General: Skin  is warm and dry.  Neurological:     Mental Status: She is alert and oriented to person, place, and time.  Psychiatric:        Behavior: Behavior normal.     Ortho Exam  Exam today reveals range of motion 3 degrees to 115 degrees.  She does have a mild to moderate effusion but not tense.  Crepitance with range of motion.Mild crepitance with ROM   Specialty Comments:  No specialty comments available.  Imaging: XR KNEE 3 VIEW LEFT  Result Date: 06/16/2020 Three-view x-rays of the left knee reveals essentially bone-on-bone medial  compartment osteoarthritis.  She has an ossicle loose body in the superior pouch.  Both mild translation of the femur medially on the tibia.  She does have spurring noted on the lateral compartment.  Incidental noting of significant tricompartment osteoarthritis of the right knee is noted with  varus positioning.    PMFS History: Current Outpatient Medications  Medication Sig Dispense Refill  . atorvastatin (LIPITOR) 20 MG tablet Take 1 tablet (20 mg total) by mouth daily. 90 tablet 1  . butalbital-acetaminophen-caffeine (FIORICET) 50-325-40 MG tablet Take 1 tablet by mouth every 6 (six) hours as needed for headache. 10 tablet 1  . diazepam (VALIUM) 10 MG tablet 1  qam   2  qhs 90 tablet 5  . gabapentin (NEURONTIN) 300 MG capsule Take 2 capsules (600 mg total) by mouth 3 (three) times daily. 540 capsule 1  . venlafaxine XR (EFFEXOR-XR) 75 MG 24 hr capsule 1  qam 90 capsule 1   No current facility-administered medications for this visit.    Patient Active Problem List   Diagnosis Date Noted  . Primary osteoarthritis of left knee 06/16/2020  . Effusion, left knee 06/16/2020  . Loose body in knee, left knee 06/16/2020  . Vitamin D deficiency 03/24/2020  . Hyperlipidemia 03/24/2020  . Pain in left foot 01/21/2020  . Primary osteoarthritis of right knee 08/06/2019  . 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 Other meniscus derangements, posterior horn of medial meniscus, left knee 02/19/2019  . Bucket handle tear of lateral meniscus 02/19/2019  . Unilateral primary osteoarthritis, right knee 12/13/2018  . Panic disorder 12/13/2018  . Chronic headaches 12/13/2018  . Weakness 09/06/2017  . Depression 09/06/2017  . 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  . Generalized anxiety disorder with panic attacks 09/06/2017  . Right hand pain   . Wrist swelling   . Cellulitis of right upper extremity 04/11/2016  . Elbow pain 07/20/2015  . Anxiety 07/20/2015  . Tachycardia 07/04/2015  . 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HCC)     . Skin ulcer of upper arm, limited to breakdown of skin (HCC) 07/01/2015   Past Medical History:  Diagnosis Date  . Arthritis    "knees" (09/06/2017)  . Bullous pemphigus   . Cat bite 06/2014   to left elbow  . History of blood transfusion 1988   "when I had my baby"  . Muscle weakness of lower extremity 2001; 09/05/2017   "resolved after a couple weeks; ?" (09/06/2017)  . 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 Poisoning, snake bite 04/08/2016   "[place]; RUE"  . PONV (postoperative nausea and vomiting)   . S/P right knee surgery   . Situational anxiety   . Staph infection ~ 2015   "left elbow and finger"    Family History  Problem Relation Age of Onset  . Liver disease Mother   . Dementia Mother   . Cirrhosis Mother   . Prostate  cancer Father   . Colon cancer Maternal Grandmother   . Ovarian cancer Maternal Aunt     Past Surgical History:  Procedure Laterality Date  . APPENDECTOMY  ~ 1987  . APPLICATION OF A-CELL OF EXTREMITY Left 08/05/2015   Procedure: APPLICATION OF A-CELL OF EXTREMITY;  Surgeon: Peggye Form, DO;  Location: [facility];  Service: Plastics;  Laterality: Left;  . BREAST SURGERY Right 1990   "milk duct taken out"  . CHONDROPLASTY Right 02/19/2019   Procedure: CHONDROPLASTY; EXCISION EXOSTOSIS;  Surgeon: Valeria Batman, MD;  Location: [facility];  Service: Orthopedics;  Laterality: Right;  . DEBRIDEMENT AND CLOSURE WOUND Left 07/01/2015   Procedure: LEFT ELBOW EXCISION OF WOUND WITH PRIMARY CLOSURE 2X5 CM ;  Surgeon: Peggye Form, DO;  Location: [facility];  Service: Plastics;  Laterality: Left;  . ELBOW SURGERY Left X 23 in [place] <06/2015   from a cat bite; all I&D  . I & D EXTREMITY Left 07/08/2015   Procedure: IRRIGATION AND DEBRIDEMENT EXTREMITY, DRAINAGE OF LEFT ARM WOUND, A-CELL PLACEMENT, WOUND VAC PLACEMENT;  Surgeon: Alena Bills Dillingham, DO;  Location: [facility];  Service: Plastics;   Laterality: Left;  . INCISION AND DRAINAGE OF WOUND Left 08/05/2015   Procedure: IRRIGATION AND DEBRIDEMENT LEFT ELBOW WOUND, PLACEMENT OF ACELL;  Surgeon: Peggye Form, DO;  Location: [facility];  Service: Plastics;  Laterality: Left;  . KNEE ARTHROSCOPY WITH MEDIAL MENISECTOMY Right 02/19/2019   Procedure: RIGHT KNEE ARTHROSCOPY, DEBRIDEMENT, PARTIAL MEDIAL AND LATERAL MENISECTOMY;  Surgeon: Valeria Batman, MD;  Location: [facility];  Service: Orthopedics;  Laterality: Right;  . LAPAROSCOPIC CHOLECYSTECTOMY  1998  . SKIN GRAFT Left 2016   took from anterior thigh; placed at elbow  . TONSILLECTOMY  ~ 2000  . TOTAL ABDOMINAL HYSTERECTOMY  2003  . WRIST SURGERY Right 01/2016   Social History   Occupational History  . Occupation: unemployed  Tobacco Use  . Smoking status: Never Smoker  . Smokeless tobacco: Never Used  Vaping Use  . Vaping Use: Never used  Substance and Sexual Activity  . Alcohol use: Never    Alcohol/week: 0.0 standard drinks    Comment: social  . Drug use: No  . Sexual activity: Not Currently

## 2020-06-17 MED FILL — GABAPENTIN 300 MG CAPSULE: 300 | 90 days supply | Qty: 540 | Fill #1

## 2020-06-29 ENCOUNTER — Other Ambulatory Visit (HOSPITAL_COMMUNITY): Payer: Self-pay | Admitting: *Deleted

## 2020-06-29 MED ORDER — VENLAFAXINE HCL ER 75 MG PO CP24
ORAL_CAPSULE | ORAL | 0 refills | Status: DC
Start: 2020-06-29 — End: 2020-10-02

## 2020-06-29 MED FILL — ATORVASTATIN CALCIUM 20 MG: 20 | 90 days supply | Qty: 90 | Fill #1

## 2020-06-29 MED FILL — VENLAFAXINE HCL ER 75 MG CA: 75 | 90 days supply | Qty: 90 | Fill #0

## 2020-06-30 ENCOUNTER — Other Ambulatory Visit: Payer: Self-pay | Admitting: Internal Medicine

## 2020-06-30 ENCOUNTER — Encounter: Payer: Self-pay | Admitting: Internal Medicine

## 2020-06-30 DIAGNOSIS — R519 Headache, unspecified: Secondary | ICD-10-CM

## 2020-07-01 ENCOUNTER — Other Ambulatory Visit: Payer: Self-pay | Admitting: Internal Medicine

## 2020-07-01 MED FILL — BUTALB-ACETAMIN-CAFF 50-325: 50-325-40 | 2 days supply | Qty: 10 | Fill #0

## 2020-07-01 MED FILL — DIAZEPAM 10 MG TABS: 10 | 30 days supply | Qty: 90 | Fill #2

## 2020-07-02 ENCOUNTER — Telehealth (INDEPENDENT_AMBULATORY_CARE_PROVIDER_SITE_OTHER): Payer: Self-pay | Admitting: Family Medicine

## 2020-07-02 ENCOUNTER — Encounter: Payer: Self-pay | Admitting: Family Medicine

## 2020-07-02 DIAGNOSIS — Z7189 Other specified counseling: Secondary | ICD-10-CM

## 2020-07-02 DIAGNOSIS — R112 Nausea with vomiting, unspecified: Secondary | ICD-10-CM

## 2020-07-02 NOTE — Progress Notes (Signed)
Virtual Visit via Video Note  I connected with Sabrina [name] on 07/02/20 at  4:00 PM EDT by a video enabled telemedicine application 2/2 COVID-19 pandemic and verified that I am speaking with the correct person using two identifiers.  Location patient: home Location provider:work or home office Persons participating in the virtual visit: patient, provider  I discussed the limitations of evaluation and management by telemedicine and the availability of in person appointments. The patient expressed understanding and agreed to proceed.   HPI: Pt is a 54 year old female with past medical history significant for OA, HLD, GAD panic attacks, h/o depression, chronic headaches followed by Dr. Ardyth Harps who was seen for acute concern.  Pt with nausea and vomiting x 2 days.  Possible sick contacts include her 27 and 109 yo cousins.  Pt states she was sent home from work today until she has been seen by a provider.  Pt denies fever, diarrhea, HA, cough, sore throat, ear pain or pressure, facial pain or pressure.  Pt sipping fluids.  Notes improvement.  Patient concerned about having to pay for Covid test.  Requesting a note for work.  Pt has been fully vaccinated against COVID.  ROS: See pertinent positives and negatives per HPI.  Past Medical History:  Diagnosis Date  . Arthritis    "knees" (09/06/2017)  . Bullous pemphigus   . Cat bite 06/2014   to left elbow  . History of blood transfusion 1988   "when I had my baby"  . Muscle weakness of lower extremity 2001; 09/05/2017   "resolved after a couple weeks; ?" (09/06/2017)  . 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 Poisoning, snake bite 04/08/2016   "[place]; RUE"  . PONV (postoperative nausea and vomiting)   . S/P right knee surgery   . Situational anxiety   . Staph infection ~ 2015   "left elbow and finger"    Past Surgical History:  Procedure Laterality Date  . APPENDECTOMY  ~ 1987  . APPLICATION OF A-CELL OF EXTREMITY Left 08/05/2015    Procedure: APPLICATION OF A-CELL OF EXTREMITY;  Surgeon: Peggye Form, DO;  Location: [facility];  Service: Plastics;  Laterality: Left;  . BREAST SURGERY Right 1990   "milk duct taken out"  . CHONDROPLASTY Right 02/19/2019   Procedure: CHONDROPLASTY; EXCISION EXOSTOSIS;  Surgeon: Valeria Batman, MD;  Location: [facility];  Service: Orthopedics;  Laterality: Right;  . DEBRIDEMENT AND CLOSURE WOUND Left 07/01/2015   Procedure: LEFT ELBOW EXCISION OF WOUND WITH PRIMARY CLOSURE 2X5 CM ;  Surgeon: Peggye Form, DO;  Location: [facility];  Service: Plastics;  Laterality: Left;  . ELBOW SURGERY Left X 23 in [place] <06/2015   from a cat bite; all I&D  . I & D EXTREMITY Left 07/08/2015   Procedure: IRRIGATION AND DEBRIDEMENT EXTREMITY, DRAINAGE OF LEFT ARM WOUND, A-CELL PLACEMENT, WOUND VAC PLACEMENT;  Surgeon: Alena Bills Dillingham, DO;  Location: [facility];  Service: Plastics;  Laterality: Left;  . INCISION AND DRAINAGE OF WOUND Left 08/05/2015   Procedure: IRRIGATION AND DEBRIDEMENT LEFT ELBOW WOUND, PLACEMENT OF ACELL;  Surgeon: Peggye Form, DO;  Location: [facility];  Service: Plastics;  Laterality: Left;  . KNEE ARTHROSCOPY WITH MEDIAL MENISECTOMY Right 02/19/2019   Procedure: RIGHT KNEE ARTHROSCOPY, DEBRIDEMENT, PARTIAL MEDIAL AND LATERAL MENISECTOMY;  Surgeon: Valeria Batman, MD;  Location: [facility];  Service: Orthopedics;  Laterality: Right;  . LAPAROSCOPIC CHOLECYSTECTOMY  1998  .  SKIN GRAFT Left 2016   took from anterior thigh; placed at elbow  . TONSILLECTOMY  ~ 2000  . TOTAL ABDOMINAL HYSTERECTOMY  2003  . WRIST SURGERY Right 01/2016    Family History  Problem Relation Age of Onset  . Liver disease Mother   . Dementia Mother   . Cirrhosis Mother   . Prostate cancer Father   . Colon cancer Maternal Grandmother   . Ovarian cancer Maternal Aunt      Current Outpatient Medications:   .  atorvastatin (LIPITOR) 20 MG tablet, Take 1 tablet (20 mg total) by mouth daily., Disp: 90 tablet, Rfl: 1 .  butalbital-acetaminophen-caffeine (FIORICET) 50-325-40 MG tablet, TAKE 1 TABLET BY MOUTH EVERY 6 HOURS AS NEEDED FOR HEADACHE, Disp: 10 tablet, Rfl: 1 .  diazepam (VALIUM) 10 MG tablet, 1  qam   2  qhs, Disp: 90 tablet, Rfl: 5 .  gabapentin (NEURONTIN) 300 MG capsule, Take 2 capsules (600 mg total) by mouth 3 (three) times daily., Disp: 540 capsule, Rfl: 1 .  venlafaxine XR (EFFEXOR-XR) 75 MG 24 hr capsule, 1  qam, Disp: 90 capsule, Rfl: 0  EXAM:  VITALS per patient if applicable:  RR between 12-20 bpm  GENERAL: alert, oriented, appears well and in no acute distress  HEENT: atraumatic, conjunctiva clear, no obvious abnormalities on inspection of external nose and ears  NECK: normal movements of the head and neck  LUNGS: on inspection no signs of respiratory distress, breathing rate appears normal, no obvious gross SOB, gasping or wheezing  CV: no obvious cyanosis  MS: moves all visible extremities without noticeable abnormality  PSYCH/NEURO: pleasant and cooperative, no obvious depression or anxiety, speech and thought processing grossly intact  ASSESSMENT AND PLAN:  Discussed the following assessment and plan:  Nausea and vomiting, intractability of vomiting not specified, unspecified vomiting type -discussed possible causes including acute viral gastritis.  Must also consider COVID-19 virus infection -pt advised to have COVID testing.  Given area locations offering testing.  Pt hesitant 2/2 concern about cost. -continue supportive care: fluids, bland diet, rest -given a note for work. -given precautions  Educated about COVID-19 virus infection -Discussed signs and symptoms of COVID-19 virus infection -Patient encouraged to have Covid testing   f/u prn  I discussed the assessment and treatment plan with the patient. The patient was provided an opportunity to ask  questions and all were answered. The patient agreed with the plan and demonstrated an understanding of the instructions.   The patient was advised to call back or seek an in-person evaluation if the symptoms worsen or if the condition fails to improve as anticipated.    Deeann Saint, MD

## 2020-07-03 DIAGNOSIS — R112 Nausea with vomiting, unspecified: Secondary | ICD-10-CM | POA: Diagnosis not present

## 2020-07-03 DIAGNOSIS — R197 Diarrhea, unspecified: Secondary | ICD-10-CM | POA: Diagnosis not present

## 2020-07-03 DIAGNOSIS — S0993XA Unspecified injury of face, initial encounter: Secondary | ICD-10-CM | POA: Diagnosis not present

## 2020-07-03 DIAGNOSIS — A084 Viral intestinal infection, unspecified: Secondary | ICD-10-CM | POA: Diagnosis not present

## 2020-07-03 DIAGNOSIS — Z20822 Contact with and (suspected) exposure to covid-19: Secondary | ICD-10-CM | POA: Diagnosis not present

## 2020-07-03 NOTE — Telephone Encounter (Signed)
Vaccines documented.  

## 2020-07-15 ENCOUNTER — Telehealth (INDEPENDENT_AMBULATORY_CARE_PROVIDER_SITE_OTHER): Payer: Self-pay | Admitting: Family Medicine

## 2020-07-15 ENCOUNTER — Encounter: Payer: Self-pay | Admitting: Family Medicine

## 2020-07-15 DIAGNOSIS — L120 Bullous pemphigoid: Secondary | ICD-10-CM

## 2020-07-15 MED ORDER — PREDNISONE 20 MG PO TABS: 20.0000 mg | ORAL_TABLET | Freq: Two times a day (BID) | ORAL | 0 refills | Status: DC

## 2020-07-15 MED ORDER — TRAMADOL HCL 50 MG PO TABS: 100.0000 mg | ORAL_TABLET | Freq: Four times a day (QID) | ORAL | 0 refills | Status: DC | PRN

## 2020-07-15 NOTE — Progress Notes (Signed)
Subjective:    Patient ID: Sabrina [name], female    DOB: 12/19/1965, 54 y.o.   MRN: [id number]  HPI Virtual Visit via Video Note  I connected with the patient on 07/15/20 at  1:45 PM EDT by a video enabled telemedicine application and verified that I am speaking with the correct person using two identifiers.  Location patient: home Location provider:work or home office Persons participating in the virtual visit: patient, provider  I discussed the limitations of evaluation and management by telemedicine and the availability of in person appointments. The patient expressed understanding and agreed to proceed.   HPI: Here for a bout of bullous pemphigoid. She was diagnosed with this condition about 20 years ago, and she has a bout every few years that acts just like this one. Early this morning she developed generalized itching over her body, and then she noticed red painful blisters appearing on the scalp, arms, legs, and trunk. So far these are fairly flat, but within the next 24 hours she expects them to grow larger into actual bullae. No fever. No recenet illness or stressors, no recent medication changes, etc.    ROS: See pertinent positives and negatives per HPI.  Past Medical History:  Diagnosis Date  . Arthritis    "knees" (09/06/2017)  . Bullous pemphigus   . Cat bite 06/2014   to left elbow  . History of blood transfusion 1988   "when I had my baby"  . Muscle weakness of lower extremity 2001; 09/05/2017   "resolved after a couple weeks; ?" (09/06/2017)  . 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 Poisoning, snake bite 04/08/2016   "[place]; RUE"  . PONV (postoperative nausea and vomiting)   . S/P right knee surgery   . Situational anxiety   . Staph infection ~ 2015   "left elbow and finger"    Past Surgical History:  Procedure Laterality Date  . APPENDECTOMY  ~ 1987  . APPLICATION OF A-CELL OF EXTREMITY Left 08/05/2015   Procedure: APPLICATION OF A-CELL OF EXTREMITY;   Surgeon: Peggye Form, DO;  Location: [facility];  Service: Plastics;  Laterality: Left;  . BREAST SURGERY Right 1990   "milk duct taken out"  . CHONDROPLASTY Right 02/19/2019   Procedure: CHONDROPLASTY; EXCISION EXOSTOSIS;  Surgeon: Valeria Batman, MD;  Location: [facility];  Service: Orthopedics;  Laterality: Right;  . DEBRIDEMENT AND CLOSURE WOUND Left 07/01/2015   Procedure: LEFT ELBOW EXCISION OF WOUND WITH PRIMARY CLOSURE 2X5 CM ;  Surgeon: Peggye Form, DO;  Location: [facility];  Service: Plastics;  Laterality: Left;  . ELBOW SURGERY Left X 23 in [place] <06/2015   from a cat bite; all I&D  . I & D EXTREMITY Left 07/08/2015   Procedure: IRRIGATION AND DEBRIDEMENT EXTREMITY, DRAINAGE OF LEFT ARM WOUND, A-CELL PLACEMENT, WOUND VAC PLACEMENT;  Surgeon: Alena Bills Dillingham, DO;  Location: [facility];  Service: Plastics;  Laterality: Left;  . INCISION AND DRAINAGE OF WOUND Left 08/05/2015   Procedure: IRRIGATION AND DEBRIDEMENT LEFT ELBOW WOUND, PLACEMENT OF ACELL;  Surgeon: Peggye Form, DO;  Location: [facility];  Service: Plastics;  Laterality: Left;  . KNEE ARTHROSCOPY WITH MEDIAL MENISECTOMY Right 02/19/2019   Procedure: RIGHT KNEE ARTHROSCOPY, DEBRIDEMENT, PARTIAL MEDIAL AND LATERAL MENISECTOMY;  Surgeon: Valeria Batman, MD;  Location: [facility];  Service: Orthopedics;  Laterality: Right;  . LAPAROSCOPIC CHOLECYSTECTOMY  1998  . SKIN GRAFT Left 2016  took from anterior thigh; placed at elbow  . TONSILLECTOMY  ~ 2000  . TOTAL ABDOMINAL HYSTERECTOMY  2003  . WRIST SURGERY Right 01/2016    Family History  Problem Relation Age of Onset  . Liver disease Mother   . Dementia Mother   . Cirrhosis Mother   . Prostate cancer Father   . Colon cancer Maternal Grandmother   . Ovarian cancer Maternal Aunt      Current Outpatient Medications:  .  atorvastatin (LIPITOR) 20 MG tablet, Take 1  tablet (20 mg total) by mouth daily., Disp: 90 tablet, Rfl: 1 .  butalbital-acetaminophen-caffeine (FIORICET) 50-325-40 MG tablet, TAKE 1 TABLET BY MOUTH EVERY 6 HOURS AS NEEDED FOR HEADACHE, Disp: 10 tablet, Rfl: 1 .  diazepam (VALIUM) 10 MG tablet, 1  qam   2  qhs, Disp: 90 tablet, Rfl: 5 .  gabapentin (NEURONTIN) 300 MG capsule, Take 2 capsules (600 mg total) by mouth 3 (three) times daily., Disp: 540 capsule, Rfl: 1 .  venlafaxine XR (EFFEXOR-XR) 75 MG 24 hr capsule, 1  qam, Disp: 90 capsule, Rfl: 0 .  predniSONE (DELTASONE) 20 MG tablet, Take 1 tablet (20 mg total) by mouth 2 (two) times daily with a meal., Disp: 30 tablet, Rfl: 0 .  traMADol (ULTRAM) 50 MG tablet, Take 2 tablets (100 mg total) by mouth every 6 (six) hours as needed for moderate pain., Disp: 120 tablet, Rfl: 0  EXAM:  VITALS per patient if applicable:  GENERAL: alert, oriented, appears well and in no acute distress  HEENT: atraumatic, conjunttiva clear, no obvious abnormalities on inspection of external nose and ears  NECK: normal movements of the head and neck  LUNGS: on inspection no signs of respiratory distress, breathing rate appears normal, no obvious gross SOB, gasping or wheezing  CV: no obvious cyanosis  MS: moves all visible extremities without noticeable abnormality  PSYCH/NEURO: pleasant and cooperative, no obvious depression or anxiety, speech and thought processing grossly intact  ASSESSMENT AND PLAN: Bullous pemphigoid. She will take Zyrtec 10 mg BID. We will start her on Prednisone 20 mg to take BID for the next week. She can use Tramadol as needed for pain. 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 We plan to have a follow up visit next week to see how she is doing. When the bullae start to disappear, we will begin a taper off the steroids.  Gershon Crane, MD  Discussed the following assessment and plan:  No diagnosis found.     I discussed the assessment and treatment plan with the  patient. The patient was provided an opportunity to ask questions and all were answered. The patient agreed with the plan and demonstrated an understanding of the instructions.   The patient was advised to call back or seek an in-person evaluation if the symptoms worsen or if the condition fails to improve as anticipated.     Review of Systems     Objective:   Physical Exam        Assessment & Plan:

## 2020-07-28 ENCOUNTER — Ambulatory Visit: Payer: Self-pay | Admitting: Internal Medicine

## 2020-07-30 MED FILL — BUTALB-ACETAMIN-CAFF 50-325: 50-325-40 | 2 days supply | Qty: 10 | Fill #1

## 2020-07-30 MED FILL — DIAZEPAM 10 MG TABS: 10 | 30 days supply | Qty: 90 | Fill #3

## 2020-08-03 ENCOUNTER — Encounter: Payer: Self-pay | Admitting: Family Medicine

## 2020-08-03 ENCOUNTER — Encounter: Payer: Self-pay | Admitting: Internal Medicine

## 2020-08-03 ENCOUNTER — Telehealth (INDEPENDENT_AMBULATORY_CARE_PROVIDER_SITE_OTHER): Payer: Self-pay | Admitting: Family Medicine

## 2020-08-03 VITALS — Ht 64.0 in

## 2020-08-03 DIAGNOSIS — F410 Panic disorder [episodic paroxysmal anxiety] without agoraphobia: Secondary | ICD-10-CM

## 2020-08-03 DIAGNOSIS — F411 Generalized anxiety disorder: Secondary | ICD-10-CM

## 2020-08-03 DIAGNOSIS — R519 Headache, unspecified: Secondary | ICD-10-CM

## 2020-08-03 DIAGNOSIS — G8929 Other chronic pain: Secondary | ICD-10-CM

## 2020-08-03 DIAGNOSIS — M791 Myalgia, unspecified site: Secondary | ICD-10-CM

## 2020-08-03 DIAGNOSIS — G43101 Migraine with aura, not intractable, with status migrainosus: Secondary | ICD-10-CM | POA: Diagnosis not present

## 2020-08-03 NOTE — Progress Notes (Signed)
Virtual Visit via Video Note I connected with Sabrina [name] on 08/03/20 by a video enabled telemedicine application and verified that I am speaking with the correct person using two identifiers.  Location patient: home Location provider:work office Persons participating in the virtual visit: patient, provider  I discussed the limitations of evaluation and management by telemedicine and the availability of in person appointments. The patient expressed understanding and agreed to proceed.  Chief Complaint  Patient presents with  . Dizziness  . Headache  . tingling in hands & lips    HPI: Sabrina. [name] is a 54 year old female with history of anxiety, OSA, headaches, and hyperlipidemia concerned about episode of "bad" headache and dizziness that started earlier today. She was seen in acute care today,Dx'ed with migraine with aura and panic attack..  This is one of her typical headaches, left sharp frontal headache associated with nausea and sometimes vomiting. Negative for photophobia or phonophobia. She has about 2-3 headaches per week, can be a bad as 9/10. In the past she has followed with Dr. Allena Katz, neurologist.She did not have brain MRI because at that time she did not have insurance. Fioricet usually helps,did not take it today.  Negative for fever, chills, changes in appetite, sore throat, cough, dyspnea, wheezing, abdominal pain, or urinary symptoms. Dizziness this morning,exacerbated by getting up,lasted a couple hours..  Finger tips and perioral tingling sensation. No associated weakness or Sabrina changes. She has had similar times when having panic attacks. She took 1/4 of her Valium 10 mg and symptoms have resolved.  She has been under some stress and anxious because her father passed away at this will be the first Thanksgiving without him.  She is also c/o generalized muscle aches, she has similar pain when she is going to have a flare up of bullous pemphigoid.Exacerbated by  stress. She has had problem sine 1999, followed with dermatologist when she was living in [place]. Oral steroids have not helped in the past. Last treated on 07/15/20, completed Prednisone and Tramadol treatment, the latter one really help with pain. Tylenol doe snot help with pain.  ROS: See pertinent positives and negatives per HPI.  Past Medical History:  Diagnosis Date  . Arthritis    "knees" (09/06/2017)  . Bullous pemphigus   . Cat bite 06/2014   to left elbow  . History of blood transfusion 1988   "when I had my baby"  . Muscle weakness of lower extremity 2001; 09/05/2017   "resolved after a couple weeks; ?" (09/06/2017)  . 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 Poisoning, snake bite 04/08/2016   "[place]; RUE"  . PONV (postoperative nausea and vomiting)   . S/P right knee surgery   . Situational anxiety   . Staph infection ~ 2015   "left elbow and finger"    Past Surgical History:  Procedure Laterality Date  . APPENDECTOMY  ~ 1987  . APPLICATION OF A-CELL OF EXTREMITY Left 08/05/2015   Procedure: APPLICATION OF A-CELL OF EXTREMITY;  Surgeon: Peggye Form, DO;  Location: [facility];  Service: Plastics;  Laterality: Left;  . BREAST SURGERY Right 1990   "milk duct taken out"  . CHONDROPLASTY Right 02/19/2019   Procedure: CHONDROPLASTY; EXCISION EXOSTOSIS;  Surgeon: Valeria Batman, MD;  Location: [facility];  Service: Orthopedics;  Laterality: Right;  . DEBRIDEMENT AND CLOSURE WOUND Left 07/01/2015   Procedure: LEFT ELBOW EXCISION OF WOUND WITH PRIMARY CLOSURE 2X5 CM ;  Surgeon: Alena Bills Dillingham, DO;  Location: [facility];  Service: Plastics;  Laterality: Left;  . ELBOW SURGERY Left X 23 in [place] <06/2015   from a cat bite; all I&D  . I & D EXTREMITY Left 07/08/2015   Procedure: IRRIGATION AND DEBRIDEMENT EXTREMITY, DRAINAGE OF LEFT ARM WOUND, A-CELL PLACEMENT, WOUND VAC PLACEMENT;  Surgeon: Alena Bills Dillingham,  DO;  Location: [facility];  Service: Plastics;  Laterality: Left;  . INCISION AND DRAINAGE OF WOUND Left 08/05/2015   Procedure: IRRIGATION AND DEBRIDEMENT LEFT ELBOW WOUND, PLACEMENT OF ACELL;  Surgeon: Peggye Form, DO;  Location: [facility];  Service: Plastics;  Laterality: Left;  . KNEE ARTHROSCOPY WITH MEDIAL MENISECTOMY Right 02/19/2019   Procedure: RIGHT KNEE ARTHROSCOPY, DEBRIDEMENT, PARTIAL MEDIAL AND LATERAL MENISECTOMY;  Surgeon: Valeria Batman, MD;  Location: [facility];  Service: Orthopedics;  Laterality: Right;  . LAPAROSCOPIC CHOLECYSTECTOMY  1998  . SKIN GRAFT Left 2016   took from anterior thigh; placed at elbow  . TONSILLECTOMY  ~ 2000  . TOTAL ABDOMINAL HYSTERECTOMY  2003  . WRIST SURGERY Right 01/2016    Family History  Problem Relation Age of Onset  . Liver disease Mother   . Dementia Mother   . Cirrhosis Mother   . Prostate cancer Father   . Colon cancer Maternal Grandmother   . Ovarian cancer Maternal Aunt     Social History   Socioeconomic History  . Marital status: Divorced    Spouse name: Not on file  . Number of children: 1  . Years of education: Not on file  . Highest education level: Associate degree: academic program  Occupational History  . Occupation: unemployed  Tobacco Use  . Smoking status: Never Smoker  . Smokeless tobacco: Never Used  Vaping Use  . Vaping Use: Never used  Substance and Sexual Activity  . Alcohol use: Never    Alcohol/week: 0.0 standard drinks    Comment: social  . Drug use: No  . Sexual activity: Not Currently  Other Topics Concern  . Not on file  Social History Narrative   Pateint is right-handed. She lives alone in a single level home. She rarely drinks caffeine. She is limited to exercise due to knee injuries.   Social Determinants of Health   Financial Resource Strain:   . Difficulty of Paying Living Expenses: Not on file  Food Insecurity:   . Worried About Brewing technologist in the Last Year: Not on file  . Ran Out of Food in the Last Year: Not on file  Transportation Needs:   . Lack of Transportation (Medical): Not on file  . Lack of Transportation (Non-Medical): Not on file  Physical Activity:   . Days of Exercise per Week: Not on file  . Minutes of Exercise per Session: Not on file  Stress:   . Feeling of Stress : Not on file  Social Connections:   . Frequency of Communication with Friends and Family: Not on file  . Frequency of Social Gatherings with Friends and Family: Not on file  . Attends Religious Services: Not on file  . Active Member of Clubs or Organizations: Not on file  . Attends Banker Meetings: Not on file  . Marital Status: Not on file  Intimate Partner Violence:   . Fear of Current or Ex-Partner: Not on file  . Emotionally Abused: Not on file  . Physically Abused: Not on file  . Sexually Abused: Not on file  Current Outpatient Medications:  .  atorvastatin (LIPITOR) 20 MG tablet, Take 1 tablet (20 mg total) by mouth daily., Disp: 90 tablet, Rfl: 1 .  butalbital-acetaminophen-caffeine (FIORICET) 50-325-40 MG tablet, TAKE 1 TABLET BY MOUTH EVERY 6 HOURS AS NEEDED FOR HEADACHE, Disp: 10 tablet, Rfl: 1 .  diazepam (VALIUM) 10 MG tablet, 1  qam   2  qhs, Disp: 90 tablet, Rfl: 5 .  gabapentin (NEURONTIN) 300 MG capsule, Take 2 capsules (600 mg total) by mouth 3 (three) times daily., Disp: 540 capsule, Rfl: 1 .  predniSONE (DELTASONE) 20 MG tablet, Take 1 tablet (20 mg total) by mouth 2 (two) times daily with a meal., Disp: 30 tablet, Rfl: 0 .  traMADol (ULTRAM) 50 MG tablet, Take 2 tablets (100 mg total) by mouth every 6 (six) hours as needed for moderate pain., Disp: 120 tablet, Rfl: 0 .  venlafaxine XR (EFFEXOR-XR) 75 MG 24 hr capsule, 1  qam, Disp: 90 capsule, Rfl: 0  EXAM:  VITALS per patient if applicable:Ht 5\' 4"  (1.626 m)   BMI 29.18 kg/m   GENERAL: alert, oriented, appears well and in no acute  distress  HEENT: atraumatic, conjunctiva clear, no obvious abnormalities on inspection.  NECK: normal movements of the head and neck  LUNGS: on inspection no signs of respiratory distress, breathing rate appears normal, no obvious gross SOB, gasping or wheezing  CV: no obvious cyanosis  Sabrina: moves all visible extremities without noticeable abnormality  Skin: left shoulder crusty lesion. I do not appreciate vesicular/bullae.  PSYCH/NEURO: pleasant and cooperative, no obvious depression or anxiety, speech and thought processing grossly intact. No focal weakness appreciated.  ASSESSMENT AND PLAN:  Discussed the following assessment and plan:  Chronic intractable headache, unspecified headache type - Plan: Ambulatory referral to Neurology Migraine headache. Evaluated today in acute care. Hx does not suggest a serious process.  Having 2-3 debilitating headaches per week. We discussed options in regard to prophylactic treatment.Because risk of interaction with some of her meds, I am not recommending Topamax or Amitriptyline. Recommend discussing other treatment options with neuro,Dr .  Myalgia Preceding bullous pemphigoid flare ups. Caution advised with Tramadol , risk of interaction with Effexor. Topical icy hot,asper cream,or lidocaine may help. Follow with pcp.  Generalized anxiety disorder with panic attacks Continue Effexor XR 75 mg daily and Valium 10 mg . Follows with psychiatrist, Dr Allena Katz.  No problem-specific Assessment & Plan notes found for this encounter.   I discussed the assessment and treatment plan with the patient. Sabrina [name] was provided an opportunity to ask questions and all were answered. She agreed with the plan and demonstrated an understanding of the instructions.   Return if symptoms worsen or fail to improve, for keep appt with PCP.Darl Pikes  Aliza Moret Marland Kitchen, MD

## 2020-08-05 ENCOUNTER — Encounter (HOSPITAL_BASED_OUTPATIENT_CLINIC_OR_DEPARTMENT_OTHER): Payer: Self-pay

## 2020-08-05 ENCOUNTER — Other Ambulatory Visit: Payer: Self-pay

## 2020-08-05 ENCOUNTER — Emergency Department (HOSPITAL_BASED_OUTPATIENT_CLINIC_OR_DEPARTMENT_OTHER): Payer: BC Managed Care – PPO

## 2020-08-05 ENCOUNTER — Emergency Department (HOSPITAL_BASED_OUTPATIENT_CLINIC_OR_DEPARTMENT_OTHER)
Admission: EM | Admit: 2020-08-05 | Discharge: 2020-08-05 | Disposition: A | Discharge status: Home / Self Care | Payer: BC Managed Care – PPO | Attending: Emergency Medicine | Admitting: Emergency Medicine

## 2020-08-05 DIAGNOSIS — R42 Dizziness and giddiness: Secondary | ICD-10-CM | POA: Diagnosis not present

## 2020-08-05 DIAGNOSIS — Z79899 Other long term (current) drug therapy: Secondary | ICD-10-CM | POA: Diagnosis not present

## 2020-08-05 DIAGNOSIS — R55 Syncope and collapse: Secondary | ICD-10-CM | POA: Insufficient documentation

## 2020-08-05 DIAGNOSIS — R519 Headache, unspecified: Secondary | ICD-10-CM

## 2020-08-05 LAB — BASIC METABOLIC PANEL
Anion gap: 9 (ref 5–15)
BUN: 29 mg/dL — ABNORMAL HIGH (ref 6–20)
CO2: 23 mmol/L (ref 22–32)
Calcium: 8.9 mg/dL (ref 8.9–10.3)
Chloride: 107 mmol/L (ref 98–111)
Creatinine, Ser: 0.98 mg/dL (ref 0.44–1.00)
GFR, Estimated: >60 mL/min (ref >60)
Glucose, Bld: 99 mg/dL (ref 70–99)
Potassium: 3.8 mmol/L (ref 3.5–5.1)
Sodium: 139 mmol/L (ref 135–145)

## 2020-08-05 LAB — CBC WITH DIFFERENTIAL/PLATELET
Abs Immature Granulocytes: 0.01 10*3/uL (ref 0.00–0.07)
Basophils Absolute: 0 10*3/uL (ref 0.0–0.1)
Basophils Relative: 0 %
Eosinophils Absolute: 0.1 10*3/uL (ref 0.0–0.5)
Eosinophils Relative: 1 %
HCT: 36.4 % (ref 36.0–46.0)
Hemoglobin: 11.8 g/dL — ABNORMAL LOW (ref 12.0–15.0)
Immature Granulocytes: 0 %
Lymphocytes Relative: 28 %
Lymphs Abs: 1.4 10*3/uL (ref 0.7–4.0)
MCH: 31.1 pg (ref 26.0–34.0)
MCHC: 32.4 g/dL (ref 30.0–36.0)
MCV: 96 fL (ref 80.0–100.0)
Monocytes Absolute: 0.4 10*3/uL (ref 0.1–1.0)
Monocytes Relative: 9 %
Neutro Abs: 3.1 10*3/uL (ref 1.7–7.7)
Neutrophils Relative %: 62 %
Platelets: 255 10*3/uL (ref 150–400)
RBC: 3.79 MIL/uL — ABNORMAL LOW (ref 3.87–5.11)
RDW: 13.3 % (ref 11.5–15.5)
WBC: 5 10*3/uL (ref 4.0–10.5)
nRBC: 0 % (ref 0.0–0.2)

## 2020-08-05 MED ORDER — PROCHLORPERAZINE EDISYLATE 10 MG/2ML IJ SOLN
10.0000 mg | Freq: Once | INTRAMUSCULAR | Status: AC
  Administered 2020-08-05: 10 mg via INTRAVENOUS
  Filled 2020-08-05: qty 2

## 2020-08-05 MED ORDER — SODIUM CHLORIDE 0.9 % IV BOLUS
1000.0000 mL | Freq: Once | INTRAVENOUS | Status: AC
  Administered 2020-08-05: 1000 mL via INTRAVENOUS

## 2020-08-05 MED ORDER — DIPHENHYDRAMINE HCL 50 MG/ML IJ SOLN
12.5000 mg | Freq: Once | INTRAMUSCULAR | Status: AC
  Administered 2020-08-05: 12.5 mg via INTRAVENOUS
  Filled 2020-08-05: qty 1

## 2020-08-05 NOTE — ED Notes (Signed)
Resting quietly with eyes, immediately opens eyes to name called. Appears very sleepy, has family here to drive her home, pt escorted to exit via wheelchair

## 2020-08-05 NOTE — ED Triage Notes (Addendum)
Pt c/o intermittent dizziness, HA, nausea x 3 days-steady gait-NAD-states she became dizzy this am when she squatted down and struck right side of face on shower wall-slight bruising to right cheek-denies LOC

## 2020-08-05 NOTE — ED Notes (Signed)
Presents with HA with some dizziness for the past few days. EDP at bedside

## 2020-08-05 NOTE — ED Provider Notes (Addendum)
[facility] EMERGENCY DEPARTMENT Provider Note   CSN: [id number] Arrival date & time: 08/05/20  1227     History Chief Complaint  Patient presents with  . Headache  . Dizziness    Sabrina [name] is a 54 y.o. female.  The history is provided by the patient.  Headache Pain location:  Generalized Severity currently:  4/10 Onset quality:  Gradual Timing:  Intermittent Progression:  Waxing and waning Chronicity:  Recurrent Similar to prior headaches: yes   Context: not activity   Relieved by:  Acetaminophen Associated symptoms: dizziness   Associated symptoms: no abdominal pain, no back pain, no blurred vision, no congestion, no cough, no ear pain, no eye pain, no fever, no neck stiffness, no numbness, no paresthesias, no photophobia, no seizures, no sore throat, no vomiting and no weakness        Past Medical History:  Diagnosis Date  . Arthritis    "knees" (09/06/2017)  . Bullous pemphigus   . Cat bite 06/2014   to left elbow  . History of blood transfusion 1988   "when I had my baby"  . Muscle weakness of lower extremity 2001; 09/05/2017   "resolved after a couple weeks; ?" (09/06/2017)  . 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 Poisoning, snake bite 04/08/2016   "[place]; RUE"  . PONV (postoperative nausea and vomiting)   . S/P right knee surgery   . Situational anxiety   . Staph infection ~ 2015   "left elbow and finger"    Patient Active Problem List   Diagnosis Date Noted  . Primary osteoarthritis of left knee 06/16/2020  . Effusion, left knee 06/16/2020  . Loose body in knee, left knee 06/16/2020  . Vitamin D deficiency 03/24/2020  . Hyperlipidemia 03/24/2020  . Pain in left foot 01/21/2020  . Primary osteoarthritis of right knee 08/06/2019  . 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 Other meniscus derangements, posterior horn of medial meniscus, left knee 02/19/2019  . Bucket handle tear of lateral meniscus 02/19/2019  . Unilateral primary  osteoarthritis, right knee 12/13/2018  . Panic disorder 12/13/2018  . Chronic headaches 12/13/2018  . Weakness 09/06/2017  . Depression 09/06/2017  . 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  . Generalized anxiety disorder with panic attacks 09/06/2017  . Right hand pain   . Wrist swelling   . Cellulitis of right upper extremity 04/11/2016  . Elbow pain 07/20/2015  . Anxiety 07/20/2015  . Tachycardia 07/04/2015  . 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HCC)   . Skin ulcer of upper arm, limited to breakdown of skin (HCC) 07/01/2015    Past Surgical History:  Procedure Laterality Date  . APPENDECTOMY  ~ 1987  . APPLICATION OF A-CELL OF EXTREMITY Left 08/05/2015   Procedure: APPLICATION OF A-CELL OF EXTREMITY;  Surgeon: Peggye Form, DO;  Location: [facility];  Service: Plastics;  Laterality: Left;  . BREAST SURGERY Right 1990   "milk duct taken out"  . CHONDROPLASTY Right 02/19/2019   Procedure: CHONDROPLASTY; EXCISION EXOSTOSIS;  Surgeon: Valeria Batman, MD;  Location: [facility];  Service: Orthopedics;  Laterality: Right;  . DEBRIDEMENT AND CLOSURE WOUND Left 07/01/2015   Procedure: LEFT ELBOW EXCISION OF WOUND WITH PRIMARY CLOSURE 2X5 CM ;  Surgeon: Peggye Form, DO;  Location: [facility];  Service: Plastics;  Laterality: Left;  . ELBOW SURGERY Left X 23 in [place] <06/2015   from a cat bite; all I&D  . I & D EXTREMITY Left 07/08/2015  Procedure: IRRIGATION AND DEBRIDEMENT EXTREMITY, DRAINAGE OF LEFT ARM WOUND, A-CELL PLACEMENT, WOUND VAC PLACEMENT;  Surgeon: Alena Bills Dillingham, DO;  Location: [facility];  Service: Plastics;  Laterality: Left;  . INCISION AND DRAINAGE OF WOUND Left 08/05/2015   Procedure: IRRIGATION AND DEBRIDEMENT LEFT ELBOW WOUND, PLACEMENT OF ACELL;  Surgeon: Peggye Form, DO;  Location: [facility];  Service: Plastics;  Laterality: Left;  . KNEE ARTHROSCOPY WITH MEDIAL MENISECTOMY Right 02/19/2019   Procedure:  RIGHT KNEE ARTHROSCOPY, DEBRIDEMENT, PARTIAL MEDIAL AND LATERAL MENISECTOMY;  Surgeon: Valeria Batman, MD;  Location: [facility];  Service: Orthopedics;  Laterality: Right;  . LAPAROSCOPIC CHOLECYSTECTOMY  1998  . SKIN GRAFT Left 2016   took from anterior thigh; placed at elbow  . TONSILLECTOMY  ~ 2000  . TOTAL ABDOMINAL HYSTERECTOMY  2003  . WRIST SURGERY Right 01/2016     OB History   No obstetric history on file.     Family History  Problem Relation Age of Onset  . Liver disease Mother   . Dementia Mother   . Cirrhosis Mother   . Prostate cancer Father   . Colon cancer Maternal Grandmother   . Ovarian cancer Maternal Aunt     Social History   Tobacco Use  . Smoking status: Never Smoker  . Smokeless tobacco: Never Used  Vaping Use  . Vaping Use: Never used  Substance Use Topics  . Alcohol use: Not Currently    Alcohol/week: 0.0 standard drinks  . Drug use: No    Home Medications Prior to Admission medications   Medication Sig Start Date End Date Taking? Authorizing Provider  atorvastatin (LIPITOR) 20 MG tablet Take 1 tablet (20 mg total) by mouth daily. 03/24/20   Philip Aspen, Limmie Patricia, MD  butalbital-acetaminophen-caffeine (FIORICET) [phone] MG tablet TAKE 1 TABLET BY MOUTH EVERY 6 HOURS AS NEEDED FOR HEADACHE 07/01/20   Philip Aspen, Limmie Patricia, MD  diazepam (VALIUM) 10 MG tablet 1  qam   2  qhs 05/07/20   Plovsky, Earvin Hansen, MD  gabapentin (NEURONTIN) 300 MG capsule Take 2 capsules (600 mg total) by mouth 3 (three) times daily. 03/18/20   Philip Aspen, Limmie Patricia, MD  predniSONE (DELTASONE) 20 MG tablet Take 1 tablet (20 mg total) by mouth 2 (two) times daily with a meal. 07/15/20   Nelwyn Salisbury, MD  traMADol (ULTRAM) 50 MG tablet Take 2 tablets (100 mg total) by mouth every 6 (six) hours as needed for moderate pain. 07/15/20   Nelwyn Salisbury, MD  venlafaxine XR (EFFEXOR-XR) 75 MG 24 hr capsule 1  qam 06/29/20   Plovsky, Earvin Hansen, MD     Allergies    Zofran Frazier Richards hcl], Diclofenac sodium, Droperidol, Flexeril [cyclobenzaprine], Morphine and related, and Tessalon [benzonatate]  Review of Systems   Review of Systems  Constitutional: Negative for chills and fever.  HENT: Negative for congestion, ear pain and sore throat.   Eyes: Negative for blurred vision, photophobia, pain and visual disturbance.  Respiratory: Negative for cough and shortness of breath.   Cardiovascular: Negative for chest pain and palpitations.  Gastrointestinal: Negative for abdominal pain and vomiting.  Genitourinary: Negative for dysuria and hematuria.  Musculoskeletal: Negative for arthralgias, back pain and neck stiffness.  Skin: Negative for color change and rash.  Neurological: Positive for dizziness and headaches. Negative for tremors, seizures, syncope, facial asymmetry, speech difficulty, weakness, light-headedness, numbness and paresthesias.  All other systems reviewed and are negative.   Physical Exam Updated Vital  Signs  ED Triage Vitals  Enc Vitals Group     BP 08/05/20 1240 99/71     Pulse Rate 08/05/20 1240 90     Resp 08/05/20 1240 18     Temp 08/05/20 1240 98.3 F (36.8 C)     Temp Source 08/05/20 1240 Oral     SpO2 08/05/20 1240 98 %     Weight 08/05/20 1240 172 lb (78 kg)     Height 08/05/20 1240 5\' 4"  (1.626 m)     Head Circumference --      Peak Flow --      Pain Score 08/05/20 1237 8     Pain Loc --      Pain Edu? --      Excl. in GC? --     Physical Exam Vitals and nursing note reviewed.  Constitutional:      General: She is not in acute distress.    Appearance: She is well-developed. She is not ill-appearing.  HENT:     Head: Normocephalic and atraumatic.     Mouth/Throat:     Mouth: Mucous membranes are moist.  Eyes:     General: No visual field deficit.    Extraocular Movements: Extraocular movements intact.     Conjunctiva/sclera: Conjunctivae normal.     Pupils: Pupils are equal, round, and  reactive to light.  Cardiovascular:     Rate and Rhythm: Normal rate and regular rhythm.     Heart sounds: Normal heart sounds. No murmur heard.   Pulmonary:     Effort: Pulmonary effort is normal. No respiratory distress.     Breath sounds: Normal breath sounds.  Abdominal:     Palpations: Abdomen is soft.     Tenderness: There is no abdominal tenderness.  Musculoskeletal:     Cervical back: Normal range of motion and neck supple.  Skin:    General: Skin is warm and dry.     Comments: Bruising over the right side of her forehead  Neurological:     Mental Status: She is alert and oriented to person, place, and time.     Cranial Nerves: No cranial nerve deficit or facial asymmetry.     Sensory: No sensory deficit.     Motor: No weakness.     Coordination: Coordination normal.     Gait: Gait normal.     Comments: 5+ out of 5 strength throughout, normal sensation, no drift, normal finger-nose-finger, normal speech, no visual field deficit  Psychiatric:        Mood and Affect: Mood normal.     ED Results / Procedures / Treatments   Labs (all labs ordered are listed, but only abnormal results are displayed) Labs Reviewed  CBC WITH DIFFERENTIAL/PLATELET - Abnormal; Notable for the following components:      Result Value   RBC 3.79 (*)    Hemoglobin 11.8 (*)    All other components within normal limits  BASIC METABOLIC PANEL - Abnormal; Notable for the following components:   BUN 29 (*)    All other components within normal limits    EKG EKG Interpretation  Date/Time:  Wednesday August 05 2020 13:11:16 EST Ventricular Rate:  76 PR Interval:    QRS Duration: 96 QT Interval:  389 QTC Calculation: 438 R Axis:   66 Text Interpretation: Sinus rhythm Low voltage, precordial leads Confirmed by 12-09-1968 [phone]) on 08/05/2020 1:13:07 PM   Radiology CT Head Wo Contrast  Result Date: 08/05/2020 CLINICAL DATA:  Dizziness  with near syncopal episode. Hit head against  solid object EXAM: CT HEAD WITHOUT CONTRAST TECHNIQUE: Contiguous axial images were obtained from the base of the skull through the vertex without intravenous contrast. COMPARISON:  August 24, 2015 FINDINGS: Brain: Ventricles and sulci are normal in size and configuration. There is no intracranial mass, hemorrhage, extra-axial fluid collection, or midline shift. The brain parenchyma appears unremarkable. No evident acute infarct. Vascular: No hyperdense vessel.  No evident vascular calcification. Skull: The bony calvarium appears intact. Sinuses/Orbits: Visualized paranasal sinuses are clear except for a 5 mm osteoma in the anterior left ethmoid sinus region. Visualized orbits appear symmetric bilaterally. Other: Mastoid air cells are clear. Debris is noted in the right external auditory canal. IMPRESSION: Normal appearing brain parenchyma.  No mass or hemorrhage. Suspect cerumen in the right external auditory canal. Electronically Signed   By: Bretta Bang III M.D.   On: 08/05/2020 13:34    Procedures Procedures (including critical care time)  Medications Ordered in ED Medications  sodium chloride 0.9 % bolus 1,000 mL (1,000 mLs Intravenous New Bag/Given 08/05/20 1332)  prochlorperazine (COMPAZINE) injection 10 mg (10 mg Intravenous Given 08/05/20 1333)  diphenhydrAMINE (BENADRYL) injection 12.5 mg (12.5 mg Intravenous Given 08/05/20 1334)    ED Course  I have reviewed the triage vital signs and the nursing notes.  Pertinent labs & imaging results that were available during my care of the patient were reviewed by me and considered in my medical decision making (see chart for details).    MDM Rules/Calculators/A&P                          Sabrina [name] is a 53 year old female history of migraines/dizziness, anxiety presents the ED with headache, dizziness.  Normal vitals.  No fever.  On and off headache and dizziness over the last several days.  Got better with Tylenol and other headache  medication that she is prescribed.  Neurologically she is intact on exam.  Normal vitals.  She was sent here after episode of dizziness at work today.  She has bruising to the right side of her forehead from hitting her head up against a wall where she felt like she is in a pass out.  She denies any chest pain, shortness of breath.  Will give headache cocktail and get a head CT.  Will check basic labs and get EKG.  Overall she appears well.  She is feeling much better after taking Tylenol prior to arrival.  Suspect headache process such as migraine possibly vertigo possibly dehydration. No concern for stroke.  No significant leukocytosis, anemia, electrolyte abnormality.  Head CT unremarkable.  Patient feeling better after IV fluids and headache cocktail.  Do suspect some orthostasis as well.  History of low blood pressure.  However blood pressures have been mostly in the 90s and low 100s.  Fluid bolus has been given.  Given education about keeping well-hydrated and taking her time when she goes from sitting to standing.  No concern for infectious process.  Understands return precautions.  This chart was dictated using voice recognition software.  Despite best efforts to proofread,  errors can occur which can change the documentation meaning.    Final Clinical Impression(s) / ED Diagnoses Final diagnoses:  Dizziness  Near syncope  Nonintractable headache, unspecified chronicity pattern, unspecified headache type    Rx / DC Orders ED Discharge Orders    None       Treacy Holcomb, DO  08/05/20 1351    Virgina NorfolkCuratolo, Vaunda Gutterman, DO 08/05/20 1448

## 2020-08-12 ENCOUNTER — Telehealth: Payer: Self-pay | Admitting: Internal Medicine

## 2020-08-18 ENCOUNTER — Encounter: Payer: Self-pay | Admitting: Internal Medicine

## 2020-08-18 ENCOUNTER — Other Ambulatory Visit: Payer: Self-pay

## 2020-08-18 ENCOUNTER — Other Ambulatory Visit: Payer: Self-pay | Admitting: Internal Medicine

## 2020-08-18 ENCOUNTER — Telehealth (INDEPENDENT_AMBULATORY_CARE_PROVIDER_SITE_OTHER): Payer: BC Managed Care – PPO | Admitting: Internal Medicine

## 2020-08-18 DIAGNOSIS — R519 Headache, unspecified: Secondary | ICD-10-CM | POA: Diagnosis not present

## 2020-08-18 DIAGNOSIS — G629 Polyneuropathy, unspecified: Secondary | ICD-10-CM

## 2020-08-18 DIAGNOSIS — G8929 Other chronic pain: Secondary | ICD-10-CM

## 2020-08-18 MED ORDER — GABAPENTIN 300 MG PO CAPS: 600.0000 mg | ORAL_CAPSULE | Freq: Three times a day (TID) | ORAL | 1 refills | Status: DC

## 2020-08-18 MED ORDER — BUTALBITAL-APAP-CAFFEINE 50-325-40 MG PO TABS: ORAL_TABLET | ORAL | 1 refills | Status: DC

## 2020-08-18 MED FILL — BUTALB-ACETAMIN-CAFF 50-325: 50-325-40 | 3 days supply | Qty: 10 | Fill #0

## 2020-08-18 NOTE — Progress Notes (Signed)
Virtual Visit via Telephone Note  I connected with Sabrina [name] on 08/18/20 at  9:30 AM EST by telephone and verified that I am speaking with the correct person using two identifiers.   I discussed the limitations, risks, security and privacy concerns of performing an evaluation and management service by telephone and the availability of in person appointments. I also discussed with the patient that there may be a patient responsible charge related to this service. The patient expressed understanding and agreed to proceed.  Location patient: home Location provider: work office Participants present for the call: patient, provider Patient did not have a visit in the prior 7 days to address this/these issue(s).   History of Present Illness:  Sabrina [name] has scheduled this visit mainly for medication refills of her gabapentin and Fioricet.  We have also discussed the recent recurrence of her headaches.  She has had 1 ED visit in several and office visits for her headaches.  She believes that caused by stress, she has had a lot of increased stress since adopting a dog in February that had a lot of health issues.  She also has a history of clinical depression and is followed by psychiatry, she is currently on Effexor and Valium, she feels like her mood is stable.  She has seen a neurologist in the past for her headaches but had to stop going due to cost issues.  They had recommended Fioricet which she does take occasionally.  She does not take it when she has to work because it does make her sleepy.  She works as a Fish farm manager.   Observations/Objective: Patient sounds cheerful and well on the phone. I do not appreciate any increased work of breathing. Speech and thought processing are grossly intact. Patient reported vitals: None reported   Current Outpatient Medications:  .  atorvastatin (LIPITOR) 20 MG tablet, Take 1 tablet (20 mg total) by mouth daily., Disp: 90 tablet, Rfl: 1 .   butalbital-acetaminophen-caffeine (FIORICET) 50-325-40 MG tablet, TAKE 1 TABLET BY MOUTH EVERY 6 HOURS AS NEEDED FOR HEADACHE, Disp: 10 tablet, Rfl: 1 .  diazepam (VALIUM) 10 MG tablet, 1  qam   2  qhs, Disp: 90 tablet, Rfl: 5 .  gabapentin (NEURONTIN) 300 MG capsule, Take 2 capsules (600 mg total) by mouth 3 (three) times daily., Disp: 540 capsule, Rfl: 1 .  predniSONE (DELTASONE) 20 MG tablet, Take 1 tablet (20 mg total) by mouth 2 (two) times daily with a meal., Disp: 30 tablet, Rfl: 0 .  venlafaxine XR (EFFEXOR-XR) 75 MG 24 hr capsule, 1  qam, Disp: 90 capsule, Rfl: 0 .  traMADol (ULTRAM) 50 MG tablet, Take 2 tablets (100 mg total) by mouth every 6 (six) hours as needed for moderate pain. (Patient not taking: Reported on 08/18/2020), Disp: 120 tablet, Rfl: 0  Review of Systems:  Constitutional: Denies fever, chills, diaphoresis, appetite change and fatigue.  HEENT: Denies photophobia, eye pain, redness, hearing loss, ear pain, congestion, sore throat, rhinorrhea, sneezing, mouth sores, trouble swallowing, neck pain, neck stiffness and tinnitus.   Respiratory: Denies SOB, DOE, cough, chest tightness,  and wheezing.   Cardiovascular: Denies chest pain, palpitations and leg swelling.  Gastrointestinal: Denies nausea, vomiting, abdominal pain, diarrhea, constipation, blood in stool and abdominal distention.  Genitourinary: Denies dysuria, urgency, frequency, hematuria, flank pain and difficulty urinating.  Endocrine: Denies: hot or cold intolerance, sweats, changes in hair or nails, polyuria, polydipsia. Musculoskeletal: Denies myalgias, back pain, joint swelling, arthralgias and gait problem.  Skin: Denies pallor, rash and wound.  Neurological: Denies dizziness, seizures, syncope, weakness, light-headedness, numbness a. Hematological: Denies adenopathy. Easy bruising, personal or family bleeding history  Psychiatric/Behavioral: Denies suicidal ideation, mood changes, confusion, nervousness, sleep  disturbance and agitation   Assessment and Plan:  Chronic intractable headache, unspecified headache type -Likely tension/stress headaches with possibly a component of migraine headaches due to her prior diagnoses.  - Plan: butalbital-acetaminophen-caffeine (FIORICET) 50-325-40 MG tablet  Neuropathy  - Plan: gabapentin (NEURONTIN) 300 MG capsule    I discussed the assessment and treatment plan with the patient. The patient was provided an opportunity to ask questions and all were answered. The patient agreed with the plan and demonstrated an understanding of the instructions.   The patient was advised to call back or seek an in-person evaluation if the symptoms worsen or if the condition fails to improve as anticipated.  I provided 22 minutes of non-face-to-face time during this encounter.   Chaya Jan, MD [facility]

## 2020-08-27 MED FILL — BUTALB-ACETAMIN-CAFF 50-325: 50-325-40 | 3 days supply | Qty: 10 | Fill #1

## 2020-08-27 MED FILL — DIAZEPAM 10 MG TABS: 10 | 30 days supply | Qty: 90 | Fill #4

## 2020-09-03 ENCOUNTER — Telehealth (INDEPENDENT_AMBULATORY_CARE_PROVIDER_SITE_OTHER): Payer: BC Managed Care – PPO | Admitting: Family Medicine

## 2020-09-03 ENCOUNTER — Encounter: Payer: Self-pay | Admitting: Internal Medicine

## 2020-09-03 DIAGNOSIS — R059 Cough, unspecified: Secondary | ICD-10-CM

## 2020-09-03 DIAGNOSIS — R0981 Nasal congestion: Secondary | ICD-10-CM

## 2020-09-03 NOTE — Patient Instructions (Addendum)
   ---------------------------------------------------------------------------------------------------------------------------      WORK SLIP:  Patient Sabrina [name],  12/17/1965, was seen for a medical visit today, 09/03/20 . Please excuse from work according to the [facility] guidelines for a COVID like illness. We advise 10 days minimum from the onset of symptoms (08/31/20) PLUS 1 day of no fever and improved symptoms. Will defer to employer for a sooner return to work if COVID19 testing is negative and the symptoms have resolved. Advise following CDC guidelines.  She/He may work remotely from home in self isolation if she is feeling better and wishes to do so.  Sincerely: E-signature: Dr. Kriste Basque, DO [facility] Ph: [phone]   ------------------------------------------------------------------------------------------------------------------------------    HOME CARE TIPS:  Dolores Lory COVID19 testing information: [email or web address] [place] [phone] Most pharmacies also offer testing and home test kits.    -can use tylenol or aleve if needed for fevers, aches and pains per instructions  -can use nasal saline a few times per day if nasal congestion, sometime a short course of Afrin nasal spray for 3 days can help as well  -stay hydrated, drink plenty of fluids and eat small healthy meals - avoid dairy  -can take 1000 IU Vit D3 and Vit C lozenges per instructions  -If the Covid test is positive, check out the CDC website for more information on home care, transmission and treatment for COVID19  -follow up with your doctor in 2-3 days unless improving and feeling better  -stay home while sick, except to seek medical care, and if you have COVID19 please stay home for a full 10 days since the onset of symptoms PLUS one day of no fever and feeling better.  It was nice to meet you today, and I really hope you are  feeling better soon. I help [facility] out with telemedicine visits on Tuesdays and Thursdays and am available for visits on those days. If you have any concerns or questions following this visit please schedule a follow up visit with your Primary Care doctor or seek care at a local urgent care clinic to avoid delays in care.    Seek in person care promptly if your symptoms worsen, new concerns arise or you are not improving with treatment. Call 911 and/or seek emergency care if you symptoms are severe or life threatening.

## 2020-09-03 NOTE — Progress Notes (Signed)
Virtual Visit via Video Note  I connected with Sabrina [name]  on 09/03/20 at 11:40 AM EST by a video enabled telemedicine application and verified that I am speaking with the correct person using two identifiers.  Location patient: home, [place] Location provider:work or home office Persons participating in the virtual visit: patient, provider  I discussed the limitations of evaluation and management by telemedicine and the availability of in person appointments. The patient expressed understanding and agreed to proceed.   HPI:  Acute telemedicine visit for : -Onset: 08/31/20 -Symptoms include:nasal congestion, cough, sore throat, fever up to high of 101, body aches -Denies: NVD, SOB, CP, known sick patients -Has tried: tylenol -Pertinent past medical history: see chart -Pertinent medication allergies: see chart -COVID-19 vaccine status: fully vaccinated, has not had booster; has not had flu vaccine, did have covid as well in the past  ROS: See pertinent positives and negatives per HPI.  Past Medical History:  Diagnosis Date  . Arthritis    "knees" (09/06/2017)  . Bullous pemphigus   . Cat bite 06/2014   to left elbow  . History of blood transfusion 1988   "when I had my baby"  . Muscle weakness of lower extremity 2001; 09/05/2017   "resolved after a couple weeks; ?" (09/06/2017)  . 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 Poisoning, snake bite 04/08/2016   "[place]; RUE"  . PONV (postoperative nausea and vomiting)   . S/P right knee surgery   . Situational anxiety   . Staph infection ~ 2015   "left elbow and finger"    Past Surgical History:  Procedure Laterality Date  . APPENDECTOMY  ~ 1987  . APPLICATION OF A-CELL OF EXTREMITY Left 08/05/2015   Procedure: APPLICATION OF A-CELL OF EXTREMITY;  Surgeon: Peggye Form, DO;  Location: [facility];  Service: Plastics;  Laterality: Left;  . BREAST SURGERY Right 1990   "milk duct taken out"  . CHONDROPLASTY Right  02/19/2019   Procedure: CHONDROPLASTY; EXCISION EXOSTOSIS;  Surgeon: Valeria Batman, MD;  Location: [facility];  Service: Orthopedics;  Laterality: Right;  . DEBRIDEMENT AND CLOSURE WOUND Left 07/01/2015   Procedure: LEFT ELBOW EXCISION OF WOUND WITH PRIMARY CLOSURE 2X5 CM ;  Surgeon: Peggye Form, DO;  Location: [facility];  Service: Plastics;  Laterality: Left;  . ELBOW SURGERY Left X 23 in [place] <06/2015   from a cat bite; all I&D  . I & D EXTREMITY Left 07/08/2015   Procedure: IRRIGATION AND DEBRIDEMENT EXTREMITY, DRAINAGE OF LEFT ARM WOUND, A-CELL PLACEMENT, WOUND VAC PLACEMENT;  Surgeon: Alena Bills Dillingham, DO;  Location: [facility];  Service: Plastics;  Laterality: Left;  . INCISION AND DRAINAGE OF WOUND Left 08/05/2015   Procedure: IRRIGATION AND DEBRIDEMENT LEFT ELBOW WOUND, PLACEMENT OF ACELL;  Surgeon: Peggye Form, DO;  Location: [facility];  Service: Plastics;  Laterality: Left;  . KNEE ARTHROSCOPY WITH MEDIAL MENISECTOMY Right 02/19/2019   Procedure: RIGHT KNEE ARTHROSCOPY, DEBRIDEMENT, PARTIAL MEDIAL AND LATERAL MENISECTOMY;  Surgeon: Valeria Batman, MD;  Location: [facility];  Service: Orthopedics;  Laterality: Right;  . LAPAROSCOPIC CHOLECYSTECTOMY  1998  . SKIN GRAFT Left 2016   took from anterior thigh; placed at elbow  . TONSILLECTOMY  ~ 2000  . TOTAL ABDOMINAL HYSTERECTOMY  2003  . WRIST SURGERY Right 01/2016     Current Outpatient Medications:  .  atorvastatin (LIPITOR) 20 MG tablet, Take 1 tablet (20 mg total) by  mouth daily., Disp: 90 tablet, Rfl: 1 .  butalbital-acetaminophen-caffeine (FIORICET) 50-325-40 MG tablet, TAKE 1 TABLET BY MOUTH EVERY 6 HOURS AS NEEDED FOR HEADACHE, Disp: 10 tablet, Rfl: 1 .  diazepam (VALIUM) 10 MG tablet, 1  qam   2  qhs, Disp: 90 tablet, Rfl: 5 .  gabapentin (NEURONTIN) 300 MG capsule, Take 2 capsules (600 mg total) by mouth 3 (three) times daily., Disp: 540  capsule, Rfl: 1 .  predniSONE (DELTASONE) 20 MG tablet, Take 1 tablet (20 mg total) by mouth 2 (two) times daily with a meal., Disp: 30 tablet, Rfl: 0 .  traMADol (ULTRAM) 50 MG tablet, Take 2 tablets (100 mg total) by mouth every 6 (six) hours as needed for moderate pain. (Patient not taking: Reported on 08/18/2020), Disp: 120 tablet, Rfl: 0 .  venlafaxine XR (EFFEXOR-XR) 75 MG 24 hr capsule, 1  qam, Disp: 90 capsule, Rfl: 0  EXAM:  VITALS per patient if applicable:  GENERAL: alert, oriented, appears well and in no acute distress  HEENT: atraumatic, conjunttiva clear, no obvious abnormalities on inspection of external nose and ears  NECK: normal movements of the head and neck  LUNGS: on inspection no signs of respiratory distress, breathing rate appears normal, no obvious gross SOB, gasping or wheezing  CV: no obvious cyanosis  MS: moves all visible extremities without noticeable abnormality  PSYCH/NEURO: pleasant and cooperative, no obvious depression or anxiety, speech and thought processing grossly intact  ASSESSMENT AND PLAN:  Discussed the following assessment and plan:  Cough  Nasal congestion  -we discussed possible serious and likely etiologies, options for evaluation and workup, limitations of telemedicine visit vs in person visit, treatment, treatment risks and precautions. Pt prefers to treat via telemedicine empirically rather than in person at this moment. Query VURI, influenza, covid vs other. She opted for symptomatic care with nasal saline, otc cough medication such as delsym or robitussin, tylenol and covid testing. She opted against tamiflu given duration of symptoms and waning benefit vs risks. Discussed staying home while sick, isolation if covid, treatment, precautions, potential complications.  Work/School slipped offered: provided in patient instructions   Scheduled follow up with PCP offered:  Agrees to follow up if needed Advised to seek prompt in person  care if worsening, new symptoms arise, or if is not improving with treatment. Discussed options for inperson care if PCP office not available. Did let this patient know that I only do telemedicine on Tuesdays and Thursdays for [place]. Advised to schedule follow up visit with PCP or [facility] if any further questions or concerns to avoid delays in care.   I discussed the assessment and treatment plan with the patient. The patient was provided an opportunity to ask questions and all were answered. The patient agreed with the plan and demonstrated an understanding of the instructions.     Terressa Koyanagi, DO

## 2020-09-04 ENCOUNTER — Encounter (HOSPITAL_BASED_OUTPATIENT_CLINIC_OR_DEPARTMENT_OTHER): Payer: Self-pay | Admitting: Emergency Medicine

## 2020-09-04 ENCOUNTER — Emergency Department (HOSPITAL_BASED_OUTPATIENT_CLINIC_OR_DEPARTMENT_OTHER)
Admission: EM | Admit: 2020-09-04 | Discharge: 2020-09-04 | Disposition: A | Discharge status: Home / Self Care | Payer: BC Managed Care – PPO | Attending: Emergency Medicine | Admitting: Emergency Medicine

## 2020-09-04 ENCOUNTER — Other Ambulatory Visit: Payer: Self-pay

## 2020-09-04 ENCOUNTER — Emergency Department (HOSPITAL_BASED_OUTPATIENT_CLINIC_OR_DEPARTMENT_OTHER): Payer: BC Managed Care – PPO

## 2020-09-04 DIAGNOSIS — R000 Tachycardia, unspecified: Secondary | ICD-10-CM | POA: Diagnosis not present

## 2020-09-04 DIAGNOSIS — J209 Acute bronchitis, unspecified: Secondary | ICD-10-CM | POA: Insufficient documentation

## 2020-09-04 DIAGNOSIS — Z79899 Other long term (current) drug therapy: Secondary | ICD-10-CM | POA: Diagnosis not present

## 2020-09-04 DIAGNOSIS — E860 Dehydration: Secondary | ICD-10-CM | POA: Insufficient documentation

## 2020-09-04 DIAGNOSIS — Z20822 Contact with and (suspected) exposure to covid-19: Secondary | ICD-10-CM | POA: Insufficient documentation

## 2020-09-04 DIAGNOSIS — R059 Cough, unspecified: Secondary | ICD-10-CM | POA: Diagnosis not present

## 2020-09-04 DIAGNOSIS — R509 Fever, unspecified: Secondary | ICD-10-CM | POA: Diagnosis not present

## 2020-09-04 LAB — RESP PANEL BY RT-PCR (FLU A&B, COVID) ARPGX2
Influenza A by PCR: NEGATIVE
Influenza B by PCR: NEGATIVE
SARS Coronavirus 2 by RT PCR: NEGATIVE

## 2020-09-04 LAB — GROUP A STREP BY PCR: Group A Strep by PCR: NOT DETECTED

## 2020-09-04 MED ORDER — SODIUM CHLORIDE 0.9 % IV BOLUS
1000.0000 mL | Freq: Once | INTRAVENOUS | Status: AC
  Administered 2020-09-04: 10:00:00 1000 mL via INTRAVENOUS

## 2020-09-04 MED ORDER — DOXYCYCLINE HYCLATE 100 MG PO CAPS: 100.0000 mg | ORAL_CAPSULE | Freq: Two times a day (BID) | ORAL | 0 refills | Status: DC

## 2020-09-04 MED ORDER — HYDROCOD POLST-CPM POLST ER 10-8 MG/5ML PO SUER: 5.0000 mL | Freq: Two times a day (BID) | ORAL | 0 refills | Status: DC | PRN

## 2020-09-04 MED ORDER — DOXYCYCLINE HYCLATE 100 MG PO TABS
100.0000 mg | ORAL_TABLET | Freq: Once | ORAL | Status: AC
  Administered 2020-09-04: 11:00:00 100 mg via ORAL
  Filled 2020-09-04: qty 1

## 2020-09-04 MED ORDER — KETOROLAC TROMETHAMINE 30 MG/ML IJ SOLN
30.0000 mg | Freq: Once | INTRAMUSCULAR | Status: AC
  Administered 2020-09-04: 10:00:00 30 mg via INTRAVENOUS
  Filled 2020-09-04: qty 1

## 2020-09-04 MED ORDER — DEXAMETHASONE SODIUM PHOSPHATE 10 MG/ML IJ SOLN
10.0000 mg | Freq: Once | INTRAMUSCULAR | Status: AC
  Administered 2020-09-04: 10:00:00 10 mg via INTRAVENOUS
  Filled 2020-09-04: qty 1

## 2020-09-04 NOTE — ED Triage Notes (Signed)
Pt having cough, fever, body aches, malaise since Wednesday.  Some congestion and drainage.  Pt had dry cough.

## 2020-09-04 NOTE — ED Provider Notes (Signed)
[facility] EMERGENCY DEPARTMENT Provider Note   CSN: [id number] Arrival date & time: 09/04/20  4098     History Chief Complaint  Patient presents with  . Fever    Sabrina [name] is a 54 y.o. female.  Pt presents to the ED today with cough and fever.  She said she's had cough and body aches and fever since Wednesday, 12/22.  She has tried several otc meds without improvement in sx.  She has been triple covid vaccinated.  She has had her flu shot.  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   Fever this am was 100.8.  She took 1 g tylenol pta.        Past Medical History:  Diagnosis Date  . Arthritis    "knees" (09/06/2017)  . Bullous pemphigus   . Cat bite 06/2014   to left elbow  . History of blood transfusion 1988   "when I had my baby"  . Muscle weakness of lower extremity 2001; 09/05/2017   "resolved after a couple weeks; ?" (09/06/2017)  . 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 Poisoning, snake bite 04/08/2016   "[place]; RUE"  . PONV (postoperative nausea and vomiting)   . S/P right knee surgery   . Situational anxiety   . Staph infection ~ 2015   "left elbow and finger"    Patient Active Problem List   Diagnosis Date Noted  . Primary osteoarthritis of left knee 06/16/2020  . Effusion, left knee 06/16/2020  . Loose body in knee, left knee 06/16/2020  . Vitamin D deficiency 03/24/2020  . Hyperlipidemia 03/24/2020  . Pain in left foot 01/21/2020  . Primary osteoarthritis of right knee 08/06/2019  . 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 Other meniscus derangements, posterior horn of medial meniscus, left knee 02/19/2019  . Bucket handle tear of lateral meniscus 02/19/2019  . Unilateral primary osteoarthritis, right knee 12/13/2018  . Panic disorder 12/13/2018  . Chronic headaches 12/13/2018  . Weakness 09/06/2017  . Depression 09/06/2017  . 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  . Generalized  anxiety disorder with panic attacks 09/06/2017  . Right hand pain   . Wrist swelling   . Cellulitis of right upper extremity 04/11/2016  . Elbow pain 07/20/2015  . Anxiety 07/20/2015  . Tachycardia 07/04/2015  . 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HCC)   . Skin ulcer of upper arm, limited to breakdown of skin (HCC) 07/01/2015    Past Surgical History:  Procedure Laterality Date  . APPENDECTOMY  ~ 1987  . APPLICATION OF A-CELL OF EXTREMITY Left 08/05/2015   Procedure: APPLICATION OF A-CELL OF EXTREMITY;  Surgeon: Peggye Form, DO;  Location: [facility];  Service: Plastics;  Laterality: Left;  . BREAST SURGERY Right 1990   "milk duct taken out"  . CHONDROPLASTY Right 02/19/2019   Procedure: CHONDROPLASTY; EXCISION EXOSTOSIS;  Surgeon: Valeria Batman, MD;  Location: [facility];  Service: Orthopedics;  Laterality: Right;  . DEBRIDEMENT AND CLOSURE WOUND Left 07/01/2015   Procedure: LEFT ELBOW EXCISION OF WOUND WITH PRIMARY CLOSURE 2X5 CM ;  Surgeon: Peggye Form, DO;  Location: [facility];  Service: Plastics;  Laterality: Left;  . ELBOW SURGERY Left X 23 in [place] <06/2015   from a cat bite; all I&D  . I & D EXTREMITY Left 07/08/2015   Procedure: IRRIGATION AND DEBRIDEMENT EXTREMITY, DRAINAGE OF LEFT ARM WOUND,  A-CELL PLACEMENT, WOUND VAC PLACEMENT;  Surgeon: Alena Bills Dillingham, DO;  Location: [facility];  Service: Plastics;  Laterality: Left;  . INCISION AND DRAINAGE OF WOUND Left 08/05/2015   Procedure: IRRIGATION AND DEBRIDEMENT LEFT ELBOW WOUND, PLACEMENT OF ACELL;  Surgeon: Peggye Form, DO;  Location: [facility];  Service: Plastics;  Laterality: Left;  . KNEE ARTHROSCOPY WITH MEDIAL MENISECTOMY Right 02/19/2019   Procedure: RIGHT KNEE ARTHROSCOPY, DEBRIDEMENT, PARTIAL MEDIAL AND LATERAL MENISECTOMY;  Surgeon: Valeria Batman, MD;  Location: [facility];  Service: Orthopedics;  Laterality: Right;  .  LAPAROSCOPIC CHOLECYSTECTOMY  1998  . SKIN GRAFT Left 2016   took from anterior thigh; placed at elbow  . TONSILLECTOMY  ~ 2000  . TOTAL ABDOMINAL HYSTERECTOMY  2003  . WRIST SURGERY Right 01/2016     OB History   No obstetric history on file.     Family History  Problem Relation Age of Onset  . Liver disease Mother   . Dementia Mother   . Cirrhosis Mother   . Prostate cancer Father   . Colon cancer Maternal Grandmother   . Ovarian cancer Maternal Aunt     Social History   Tobacco Use  . Smoking status: Never Smoker  . Smokeless tobacco: Never Used  Vaping Use  . Vaping Use: Never used  Substance Use Topics  . Alcohol use: Not Currently    Alcohol/week: 0.0 standard drinks  . Drug use: No    Home Medications Prior to Admission medications   Medication Sig Start Date End Date Taking? Authorizing Provider  atorvastatin (LIPITOR) 20 MG tablet Take 1 tablet (20 mg total) by mouth daily. 03/24/20   Philip Aspen, Limmie Patricia, MD  butalbital-acetaminophen-caffeine (FIORICET) [phone] MG tablet TAKE 1 TABLET BY MOUTH EVERY 6 HOURS AS NEEDED FOR HEADACHE 08/18/20   Philip Aspen, Limmie Patricia, MD  chlorpheniramine-HYDROcodone [facility]) 10-8 MG/5ML SUER Take 5 mLs by mouth every 12 (twelve) hours as needed for cough. 09/04/20   Jacalyn Lefevre, MD  diazepam (VALIUM) 10 MG tablet 1  qam   2  qhs 05/07/20   Plovsky, Earvin Hansen, MD  doxycycline (VIBRAMYCIN) 100 MG capsule Take 1 capsule (100 mg total) by mouth 2 (two) times daily. 09/04/20   Jacalyn Lefevre, MD  gabapentin (NEURONTIN) 300 MG capsule Take 2 capsules (600 mg total) by mouth 3 (three) times daily. 08/18/20   Philip Aspen, Limmie Patricia, MD  predniSONE (DELTASONE) 20 MG tablet Take 1 tablet (20 mg total) by mouth 2 (two) times daily with a meal. 07/15/20   Nelwyn Salisbury, MD  traMADol (ULTRAM) 50 MG tablet Take 2 tablets (100 mg total) by mouth every 6 (six) hours as needed for moderate pain. Patient not taking:  Reported on 08/18/2020 07/15/20   Nelwyn Salisbury, MD  venlafaxine XR (EFFEXOR-XR) 75 MG 24 hr capsule 1  qam 06/29/20   Archer Asa, MD    Allergies    Zofran Frazier Richards hcl], Diclofenac sodium, Droperidol, Flexeril [cyclobenzaprine], Morphine and related, and Tessalon [benzonatate]  Review of Systems   Review of Systems  Constitutional: Positive for fatigue and fever.  Respiratory: Positive for cough.   All other systems reviewed and are negative.   Physical Exam Updated Vital Signs BP 96/64 (BP Location: Right Arm)   Pulse (!) 101   Temp 98.7 F (37.1 C)   Resp (!) 22   Ht 5\' 4"  (1.626 m)   Wt 78 kg   SpO2 100%  BMI 29.52 kg/m   Physical Exam Vitals and nursing note reviewed.  Constitutional:      Appearance: Normal appearance.  HENT:     Head: Normocephalic and atraumatic.     Right Ear: Tympanic membrane, ear canal and external ear normal.     Left Ear: Tympanic membrane, ear canal and external ear normal.     Nose: Nose normal.     Mouth/Throat:     Mouth: Mucous membranes are dry.  Eyes:     Extraocular Movements: Extraocular movements intact.     Conjunctiva/sclera: Conjunctivae normal.     Pupils: Pupils are equal, round, and reactive to light.  Cardiovascular:     Rate and Rhythm: Regular rhythm. Tachycardia present.     Pulses: Normal pulses.     Heart sounds: Normal heart sounds.  Pulmonary:     Effort: Pulmonary effort is normal.     Breath sounds: Normal breath sounds.  Abdominal:     General: Abdomen is flat. Bowel sounds are normal.     Palpations: Abdomen is soft.  Musculoskeletal:        General: Normal range of motion.     Cervical back: Normal range of motion and neck supple.  Skin:    General: Skin is warm.     Capillary Refill: Capillary refill takes less than 2 seconds.  Neurological:     General: No focal deficit present.     Mental Status: She is alert and oriented to person, place, and time.  Psychiatric:        Mood and  Affect: Mood normal.        Behavior: Behavior normal.        Thought Content: Thought content normal.        Judgment: Judgment normal.     ED Results / Procedures / Treatments   Labs (all labs ordered are listed, but only abnormal results are displayed) Labs Reviewed  RESP PANEL BY RT-PCR (FLU A&B, COVID) [id number]  GROUP A STREP BY PCR    EKG None  Radiology DG Chest Portable 1 View  Result Date: 09/04/2020 CLINICAL DATA:  Cough EXAM: PORTABLE CHEST 1 VIEW COMPARISON:  08/30/2019 FINDINGS: The heart size and mediastinal contours are within normal limits. Both lungs are clear. The visualized skeletal structures are unremarkable. IMPRESSION: No active disease. Electronically Signed   By: Duanne GuessNicholas  Plundo D.O.   On: 09/04/2020 09:27    Procedures Procedures (including critical care time)  Medications Ordered in ED Medications  doxycycline (VIBRA-TABS) tablet 100 mg (has no administration in time range)  sodium chloride 0.9 % bolus 1,000 mL (1,000 mLs Intravenous New Bag/Given 09/04/20 0941)  ketorolac (TORADOL) 30 MG/ML injection 30 mg (30 mg Intravenous Given 09/04/20 0941)  dexamethasone (DECADRON) injection 10 mg (10 mg Intravenous Given 09/04/20 [id number])    ED Course  I have reviewed the triage vital signs and the nursing notes.  Pertinent labs & imaging results that were available during my care of the patient were reviewed by me and considered in my medical decision making (see chart for details).    MDM Rules/Calculators/A&P                          Pt is feeling better.  She is negative for covid, flu, strep, pna.  She is stable for d/c.  Return if worse.  F/u with pcp.  Claybon JabsDana L Tyrell was evaluated in Emergency Department on 09/04/2020 for the symptoms  described in the history of present illness. She was evaluated in the context of the global COVID-19 pandemic, which necessitated consideration that the patient might be at risk for infection with the SARS-CoV-2 virus  that causes COVID-19. Institutional protocols and algorithms that pertain to the evaluation of patients at risk for COVID-19 are in a state of rapid change based on information released by regulatory bodies including the CDC and federal and state organizations. These policies and algorithms were followed during the patient's care in the ED. Final Clinical Impression(s) / ED Diagnoses Final diagnoses:  Acute bronchitis, unspecified organism  Dehydration    Rx / DC Orders ED Discharge Orders         Ordered    chlorpheniramine-HYDROcodone (TUSSIONEX PENNKINETIC ER) 10-8 MG/5ML SUER  Every 12 hours PRN        09/04/20 1051    doxycycline (VIBRAMYCIN) 100 MG capsule  2 times daily        09/04/20 1051           Jacalyn Lefevre, MD 09/04/20 1053

## 2020-09-14 MED FILL — GABAPENTIN 300 MG CAPSULE: 300 | 90 days supply | Qty: 540 | Fill #0

## 2020-09-23 ENCOUNTER — Telehealth (HOSPITAL_COMMUNITY): Payer: Self-pay | Admitting: Psychiatry

## 2020-09-25 MED FILL — DIAZEPAM 10 MG TABS: 10 | 30 days supply | Qty: 90 | Fill #5

## 2020-09-26 ENCOUNTER — Other Ambulatory Visit: Payer: Self-pay | Admitting: Internal Medicine

## 2020-09-26 DIAGNOSIS — E785 Hyperlipidemia, unspecified: Secondary | ICD-10-CM

## 2020-09-26 DIAGNOSIS — G8929 Other chronic pain: Secondary | ICD-10-CM

## 2020-09-26 DIAGNOSIS — R519 Headache, unspecified: Secondary | ICD-10-CM

## 2020-09-28 ENCOUNTER — Other Ambulatory Visit: Payer: Self-pay | Admitting: Family Medicine

## 2020-09-29 ENCOUNTER — Other Ambulatory Visit: Payer: Self-pay | Admitting: Internal Medicine

## 2020-09-29 MED FILL — ATORVASTATIN CALCIUM 20 MG: 20 | 90 days supply | Qty: 90 | Fill #0

## 2020-09-29 MED FILL — BUTALB-ACETAMIN-CAFF 50-325: 50-325-40 | 2 days supply | Qty: 10 | Fill #0

## 2020-10-01 ENCOUNTER — Ambulatory Visit: Payer: Self-pay

## 2020-10-01 ENCOUNTER — Ambulatory Visit: Payer: BC Managed Care – PPO | Admitting: Orthopaedic Surgery

## 2020-10-01 ENCOUNTER — Telehealth (INDEPENDENT_AMBULATORY_CARE_PROVIDER_SITE_OTHER): Payer: BC Managed Care – PPO | Admitting: Internal Medicine

## 2020-10-01 ENCOUNTER — Other Ambulatory Visit: Payer: Self-pay

## 2020-10-01 ENCOUNTER — Encounter: Payer: Self-pay | Admitting: Orthopaedic Surgery

## 2020-10-01 DIAGNOSIS — M25561 Pain in right knee: Secondary | ICD-10-CM

## 2020-10-01 DIAGNOSIS — M25562 Pain in left knee: Secondary | ICD-10-CM | POA: Diagnosis not present

## 2020-10-01 DIAGNOSIS — M1712 Unilateral primary osteoarthritis, left knee: Secondary | ICD-10-CM | POA: Diagnosis not present

## 2020-10-01 DIAGNOSIS — J069 Acute upper respiratory infection, unspecified: Secondary | ICD-10-CM | POA: Diagnosis not present

## 2020-10-01 DIAGNOSIS — M1711 Unilateral primary osteoarthritis, right knee: Secondary | ICD-10-CM

## 2020-10-01 DIAGNOSIS — L120 Bullous pemphigoid: Secondary | ICD-10-CM

## 2020-10-01 DIAGNOSIS — G8929 Other chronic pain: Secondary | ICD-10-CM

## 2020-10-01 MED ORDER — LIDOCAINE HCL 1 % IJ SOLN
2.0000 mL | INTRAMUSCULAR | Status: AC | PRN
  Administered 2020-10-01: 2 mL

## 2020-10-01 MED FILL — VENLAFAXINE HCL ER 75 MG CA: 75 | 90 days supply | Qty: 90 | Fill #0

## 2020-10-01 NOTE — Progress Notes (Signed)
Office Visit Note   Patient: Sabrina [name]           Date of Birth: 04-Oct-1965           MRN: [id number] Visit Date: 10/01/2020              Requested by: Philip Aspen, Limmie Patricia, MD [address] [place],  [place] [address] PCP: Philip Aspen, Limmie Patricia, MD   Assessment & Plan: Visit Diagnoses:  1. Chronic pain of both knees   2. Primary osteoarthritis of left knee   3. Primary osteoarthritis of right knee     Plan: Current symptoms of to arthritis both knees.  Ms. Geyer had several falls recently and wished to have new x-rays.  There was no evidence of any acute changes.  There is significant arthritis in all 3 compartments but but particularly medial compartment of both knees with near bone-on-bone on the left and bone-on-bone on the right.  Inject both knees with betamethasone.  Have instructed her on strengthening exercises.  She will consider knee replacement sometime towards the end of the year  Follow-Up Instructions: Return if symptoms worsen or fail to improve.   Orders:  Orders Placed This Encounter  Procedures  . XR KNEE 3 VIEW LEFT  . XR KNEE 3 VIEW RIGHT   No orders of the defined types were placed in this encounter.     Procedures: Large Joint Inj: bilateral knee on 10/01/2020 3:14 PM Indications: diagnostic evaluation Details: 25 G 1.5 in needle, anteromedial approach  Arthrogram: No  Medications (Right): 2 mL lidocaine 1 % Medications (Left): 2 mL lidocaine 1 %  2 mL betamethasone injected along with Xylocaine in the medial compartment of both knees Consent was given by the patient. Immediately prior to procedure a time out was called to verify the correct patient, procedure, equipment, support staff and site/side marked as required. Patient was prepped and draped in the usual sterile fashion.       Clinical Data: No additional findings.   Subjective: Chief Complaint  Patient presents with  . Left Knee - Pain  . Right Knee - Pain   Have seen on numerous occasions over the past several years for the osteoarthritis in both knees.  Has had cortisone injections with temporary relief.  Presently working in a vet's office and does not want to consider knee replacement in the near future.  Possibly consider surgery towards the end of the year but would like cortisone injections.  Has had several falls over the past several months and wished to have new x-rays  HPI  Review of Systems   Objective: Vital Signs: There were no vitals taken for this visit.  Physical Exam Constitutional:      Appearance: She is well-developed and well-nourished.  HENT:     Mouth/Throat:     Mouth: Oropharynx is clear and moist.  Eyes:     Extraocular Movements: EOM normal.     Pupils: Pupils are equal, round, and reactive to light.  Pulmonary:     Effort: Pulmonary effort is normal.  Skin:    General: Skin is warm and dry.  Neurological:     Mental Status: She is alert and oriented to person, place, and time.  Psychiatric:        Mood and Affect: Mood and affect normal.        Behavior: Behavior normal.     Ortho Exam awake alert and oriented x3.  Comfortable sitting.  Does  have relatively large knees.  Difficult to tell if there is an effusion.  Knees were not hot warm or red.  Increased varus bilaterally but more on the right than the left consistent with her osteoarthritis.  Some patella crepitation.  Mostly medial joint pain.  Full extension flexed over 100 degrees without instability no thigh pain or hip pain straight leg raise negative no distal edema  Specialty Comments:  No specialty comments available.  Imaging: XR KNEE 3 VIEW LEFT  Result Date: 10/01/2020 Films of the left knee were obtained in 3 projections standing and compared to prior films.  No obvious change.  There are significant degenerative changes in all 3 compartments with near bone-on-bone in the medial compartment and increased varus.  There is ectopic  calcification in the superior pouch consistent with a large loose body.  No acute changes or ectopic calcification  XR KNEE 3 VIEW RIGHT  Result Date: 10/01/2020 Films of the right knee obtained in 3 projections standing.  There is basically no change from her films that were performed previously there is significant degenerative changes in all 3 compartments with bone-on-bone medially and increased varus.  No ectopic calcification or acute change    PMFS History: Patient Active Problem List   Diagnosis Date Noted  . Primary osteoarthritis of left knee 06/16/2020  . Effusion, left knee 06/16/2020  . Loose body in knee, left knee 06/16/2020  . Vitamin D deficiency 03/24/2020  . Hyperlipidemia 03/24/2020  . Pain in left foot 01/21/2020  . Primary osteoarthritis of right knee 08/06/2019  . 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 Other meniscus derangements, posterior horn of medial meniscus, left knee 02/19/2019  . Bucket handle tear of lateral meniscus 02/19/2019  . Unilateral primary osteoarthritis, right knee 12/13/2018  . Panic disorder 12/13/2018  . Chronic headaches 12/13/2018  . Weakness 09/06/2017  . Depression 09/06/2017  . 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  . Generalized anxiety disorder with panic attacks 09/06/2017  . Right hand pain   . Wrist swelling   . Cellulitis of right upper extremity 04/11/2016  . Elbow pain 07/20/2015  . Anxiety 07/20/2015  . Tachycardia 07/04/2015  . 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HCC)   . Skin ulcer of upper arm, limited to breakdown of skin (HCC) 07/01/2015   Past Medical History:  Diagnosis Date  . Arthritis    "knees" (09/06/2017)  . Bullous pemphigus   . Cat bite 06/2014   to left elbow  . History of blood transfusion 1988   "when I had my baby"  . Muscle weakness of lower extremity 2001; 09/05/2017   "resolved after a couple weeks; ?" (09/06/2017)  . 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 Poisoning, snake bite 04/08/2016   "[place]; RUE"  . PONV  (postoperative nausea and vomiting)   . S/P right knee surgery   . Situational anxiety   . Staph infection ~ 2015   "left elbow and finger"    Family History  Problem Relation Age of Onset  . Liver disease Mother   . Dementia Mother   . Cirrhosis Mother   . Prostate cancer Father   . Colon cancer Maternal Grandmother   . Ovarian cancer Maternal Aunt     Past Surgical History:  Procedure Laterality Date  . APPENDECTOMY  ~ 1987  . APPLICATION OF A-CELL OF EXTREMITY Left 08/05/2015   Procedure: APPLICATION OF A-CELL OF EXTREMITY;  Surgeon: Peggye Form, DO;  Location: [facility];  Service: Plastics;  Laterality: Left;  .  BREAST SURGERY Right 1990   "milk duct taken out"  . CHONDROPLASTY Right 02/19/2019   Procedure: CHONDROPLASTY; EXCISION EXOSTOSIS;  Surgeon: Valeria Batman, MD;  Location: [facility];  Service: Orthopedics;  Laterality: Right;  . DEBRIDEMENT AND CLOSURE WOUND Left 07/01/2015   Procedure: LEFT ELBOW EXCISION OF WOUND WITH PRIMARY CLOSURE 2X5 CM ;  Surgeon: Peggye Form, DO;  Location: [facility];  Service: Plastics;  Laterality: Left;  . ELBOW SURGERY Left X 23 in [place] <06/2015   from a cat bite; all I&D  . I & D EXTREMITY Left 07/08/2015   Procedure: IRRIGATION AND DEBRIDEMENT EXTREMITY, DRAINAGE OF LEFT ARM WOUND, A-CELL PLACEMENT, WOUND VAC PLACEMENT;  Surgeon: Alena Bills Dillingham, DO;  Location: [facility];  Service: Plastics;  Laterality: Left;  . INCISION AND DRAINAGE OF WOUND Left 08/05/2015   Procedure: IRRIGATION AND DEBRIDEMENT LEFT ELBOW WOUND, PLACEMENT OF ACELL;  Surgeon: Peggye Form, DO;  Location: [facility];  Service: Plastics;  Laterality: Left;  . KNEE ARTHROSCOPY WITH MEDIAL MENISECTOMY Right 02/19/2019   Procedure: RIGHT KNEE ARTHROSCOPY, DEBRIDEMENT, PARTIAL MEDIAL AND LATERAL MENISECTOMY;  Surgeon: Valeria Batman, MD;  Location: [facility];  Service:  Orthopedics;  Laterality: Right;  . LAPAROSCOPIC CHOLECYSTECTOMY  1998  . SKIN GRAFT Left 2016   took from anterior thigh; placed at elbow  . TONSILLECTOMY  ~ 2000  . TOTAL ABDOMINAL HYSTERECTOMY  2003  . WRIST SURGERY Right 01/2016   Social History   Occupational History  . Occupation: unemployed  Tobacco Use  . Smoking status: Never Smoker  . Smokeless tobacco: Never Used  Vaping Use  . Vaping Use: Never used  Substance and Sexual Activity  . Alcohol use: Not Currently    Alcohol/week: 0.0 standard drinks  . Drug use: No  . Sexual activity: Not on file     Valeria Batman, MD   Note - This record has been created using [employer].  Chart creation errors have been sought, but may not always  have been located. Such creation errors do not reflect on  the standard of medical care.

## 2020-10-01 NOTE — Progress Notes (Signed)
Virtual Visit via Video Note  I connected with Sabrina [name] on 10/01/20 at  4:00 PM EST by a video enabled telemedicine application and verified that I am speaking with the correct person using two identifiers.  Location patient: home Location provider: work office Persons participating in the virtual visit: patient, provider  I discussed the limitations of evaluation and management by telemedicine and the availability of in person appointments. The patient expressed understanding and agreed to proceed.   HPI: Sabrina [name] has scheduled this visit today mainly because she needs a work note.  She has a history of bullous pemphigoid that has been flaring up.  She has been out of work today and yesterday and is required to present a work note to return.  Over the holidays she developed a cough.  She went to urgent care, her COVID, flu, strep throat test were negative.  She improved but then she again has had a resurgence of cough that is particularly bad at nighttime.  She denies any fevers.   ROS: Constitutional: Denies fever, chills, diaphoresis, appetite change and fatigue.  HEENT: Denies photophobia, eye pain, redness, hearing loss, ear pain, mouth sores, trouble swallowing, neck pain, neck stiffness and tinnitus.   Respiratory: Denies SOB, DOE, cough, chest tightness,  and wheezing.   Cardiovascular: Denies chest pain, palpitations and leg swelling.  Gastrointestinal: Denies nausea, vomiting, abdominal pain, diarrhea, constipation, blood in stool and abdominal distention.  Genitourinary: Denies dysuria, urgency, frequency, hematuria, flank pain and difficulty urinating.  Endocrine: Denies: hot or cold intolerance, sweats, changes in hair or nails, polyuria, polydipsia. Musculoskeletal: Denies myalgias, back pain, joint swelling, arthralgias and gait problem.  Skin: Denies pallor and wound.  Neurological: Denies dizziness, seizures, syncope, weakness, light-headedness, numbness and  headaches.  Hematological: Denies adenopathy. Easy bruising, personal or family bleeding history  Psychiatric/Behavioral: Denies suicidal ideation, mood changes, confusion, nervousness, sleep disturbance and agitation   Past Medical History:  Diagnosis Date  . Arthritis    "knees" (09/06/2017)  . Bullous pemphigus   . Cat bite 06/2014   to left elbow  . History of blood transfusion 1988   "when I had my baby"  . Muscle weakness of lower extremity 2001; 09/05/2017   "resolved after a couple weeks; ?" (09/06/2017)  . 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 Poisoning, snake bite 04/08/2016   "[place]; RUE"  . PONV (postoperative nausea and vomiting)   . S/P right knee surgery   . Situational anxiety   . Staph infection ~ 2015   "left elbow and finger"    Past Surgical History:  Procedure Laterality Date  . APPENDECTOMY  ~ 1987  . APPLICATION OF A-CELL OF EXTREMITY Left 08/05/2015   Procedure: APPLICATION OF A-CELL OF EXTREMITY;  Surgeon: Peggye Form, DO;  Location: [facility];  Service: Plastics;  Laterality: Left;  . BREAST SURGERY Right 1990   "milk duct taken out"  . CHONDROPLASTY Right 02/19/2019   Procedure: CHONDROPLASTY; EXCISION EXOSTOSIS;  Surgeon: Valeria Batman, MD;  Location: [facility];  Service: Orthopedics;  Laterality: Right;  . DEBRIDEMENT AND CLOSURE WOUND Left 07/01/2015   Procedure: LEFT ELBOW EXCISION OF WOUND WITH PRIMARY CLOSURE 2X5 CM ;  Surgeon: Peggye Form, DO;  Location: [facility];  Service: Plastics;  Laterality: Left;  . ELBOW SURGERY Left X 23 in [place] <06/2015   from a cat bite; all I&D  . I & D EXTREMITY Left 07/08/2015   Procedure:  IRRIGATION AND DEBRIDEMENT EXTREMITY, DRAINAGE OF LEFT ARM WOUND, A-CELL PLACEMENT, WOUND VAC PLACEMENT;  Surgeon: Alena Bills Dillingham, DO;  Location: [facility];  Service: Plastics;  Laterality: Left;  . INCISION AND DRAINAGE OF WOUND Left 08/05/2015    Procedure: IRRIGATION AND DEBRIDEMENT LEFT ELBOW WOUND, PLACEMENT OF ACELL;  Surgeon: Peggye Form, DO;  Location: [facility];  Service: Plastics;  Laterality: Left;  . KNEE ARTHROSCOPY WITH MEDIAL MENISECTOMY Right 02/19/2019   Procedure: RIGHT KNEE ARTHROSCOPY, DEBRIDEMENT, PARTIAL MEDIAL AND LATERAL MENISECTOMY;  Surgeon: Valeria Batman, MD;  Location: [facility];  Service: Orthopedics;  Laterality: Right;  . LAPAROSCOPIC CHOLECYSTECTOMY  1998  . SKIN GRAFT Left 2016   took from anterior thigh; placed at elbow  . TONSILLECTOMY  ~ 2000  . TOTAL ABDOMINAL HYSTERECTOMY  2003  . WRIST SURGERY Right 01/2016    Family History  Problem Relation Age of Onset  . Liver disease Mother   . Dementia Mother   . Cirrhosis Mother   . Prostate cancer Father   . Colon cancer Maternal Grandmother   . Ovarian cancer Maternal Aunt     SOCIAL HX:   reports that she has never smoked. She has never used smokeless tobacco. She reports previous alcohol use. She reports that she does not use drugs.   Current Outpatient Medications:  .  atorvastatin (LIPITOR) 20 MG tablet, TAKE 1 TABLET BY MOUTH ONCE A DAY, Disp: 90 tablet, Rfl: 1 .  butalbital-acetaminophen-caffeine (FIORICET) 50-325-40 MG tablet, TAKE 1 TABLET BY MOUTH EVERY 6 HOURS AS NEEDED FOR HEADACHE, Disp: 10 tablet, Rfl: 1 .  chlorpheniramine-HYDROcodone (TUSSIONEX PENNKINETIC ER) 10-8 MG/5ML SUER, Take 5 mLs by mouth every 12 (twelve) hours as needed for cough., Disp: 140 mL, Rfl: 0 .  diazepam (VALIUM) 10 MG tablet, 1  qam   2  qhs, Disp: 90 tablet, Rfl: 5 .  doxycycline (VIBRAMYCIN) 100 MG capsule, Take 1 capsule (100 mg total) by mouth 2 (two) times daily., Disp: 14 capsule, Rfl: 0 .  gabapentin (NEURONTIN) 300 MG capsule, Take 2 capsules (600 mg total) by mouth 3 (three) times daily., Disp: 540 capsule, Rfl: 1 .  predniSONE (DELTASONE) 20 MG tablet, Take 1 tablet (20 mg total) by mouth 2 (two) times daily  with a meal., Disp: 30 tablet, Rfl: 0 .  traMADol (ULTRAM) 50 MG tablet, Take 2 tablets (100 mg total) by mouth every 6 (six) hours as needed for moderate pain. (Patient not taking: Reported on 08/18/2020), Disp: 120 tablet, Rfl: 0 .  venlafaxine XR (EFFEXOR-XR) 75 MG 24 hr capsule, 1  qam, Disp: 90 capsule, Rfl: 0  EXAM:   VITALS per patient if applicable: none reported  GENERAL: alert, oriented, appears well and in no acute distress  HEENT: atraumatic, conjunttiva clear, no obvious abnormalities on inspection of external nose and ears  NECK: normal movements of the head and neck  LUNGS: on inspection no signs of respiratory distress, breathing rate appears normal, no obvious gross increased work of breathing, gasping or wheezing  CV: no obvious cyanosis  MS: moves all visible extremities without noticeable abnormality  PSYCH/NEURO: pleasant and cooperative, no obvious depression or anxiety, speech and thought processing grossly intact  ASSESSMENT AND PLAN:   Bullous pemphigoid -Work note provided.  Viral upper respiratory tract infection -Advised COVID testing, she will stay at home until results return.     I discussed the assessment and treatment plan with the patient. The patient was provided an  opportunity to ask questions and all were answered. The patient agreed with the plan and demonstrated an understanding of the instructions.   The patient was advised to call back or seek an in-person evaluation if the symptoms worsen or if the condition fails to improve as anticipated.    Lelon Frohlich, MD  [facility]

## 2020-10-02 ENCOUNTER — Telehealth (INDEPENDENT_AMBULATORY_CARE_PROVIDER_SITE_OTHER): Payer: BC Managed Care – PPO | Admitting: Psychiatry

## 2020-10-02 ENCOUNTER — Other Ambulatory Visit: Payer: Self-pay | Admitting: Orthopaedic Surgery

## 2020-10-02 ENCOUNTER — Telehealth: Payer: Self-pay | Admitting: Orthopaedic Surgery

## 2020-10-02 DIAGNOSIS — F410 Panic disorder [episodic paroxysmal anxiety] without agoraphobia: Secondary | ICD-10-CM | POA: Diagnosis not present

## 2020-10-02 MED ORDER — DIAZEPAM 10 MG PO TABS: ORAL_TABLET | ORAL | 5 refills | Status: DC

## 2020-10-02 MED ORDER — VENLAFAXINE HCL ER 75 MG PO CP24: ORAL_CAPSULE | ORAL | 1 refills | Status: DC

## 2020-10-02 NOTE — Telephone Encounter (Signed)
Pt called stating after her R knee injection she's been experiencing a lot of pain; she states she has iced it and did everything recommened but it hasn't helped. She would like a CB to advise her what else she can try   [phone]
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
## 2020-10-02 NOTE — Telephone Encounter (Signed)
Please advise. Other than ice, do you have any recommendations for patient?

## 2020-10-02 NOTE — Telephone Encounter (Signed)
I called patient and advised. She states that she is having terrible pain once she gets home in the evening after work and it is getting worse. She has tried the tylenol/advil, voltaren gel, and is using ice and heat. She states that she does not know what to do and is getting depressed. She expressed that she cannot get her knee replaced until end of year or beginning of next year. I explained this is not something that is normally treated by you with narcotic pain medications as they do not work as well post op and that you do not do chronic pain management. She explained that you had discussed this with her already, however, she does not know if there is anything else that you can possibly recommend as she is getting worse. She requested I get this information to you and ask.

## 2020-10-02 NOTE — Telephone Encounter (Signed)
Very common to have increased pain for 2-3 days after a cortisone injection. Use heat or ice and over the counter meds I.e. tylenol/advil or voltaren gel-thanks

## 2020-10-03 ENCOUNTER — Emergency Department (HOSPITAL_BASED_OUTPATIENT_CLINIC_OR_DEPARTMENT_OTHER)
Admission: EM | Admit: 2020-10-03 | Discharge: 2020-10-03 | Disposition: A | Discharge status: Home / Self Care | Payer: BC Managed Care – PPO | Attending: Emergency Medicine | Admitting: Emergency Medicine

## 2020-10-03 ENCOUNTER — Other Ambulatory Visit: Payer: Self-pay

## 2020-10-03 ENCOUNTER — Encounter (HOSPITAL_BASED_OUTPATIENT_CLINIC_OR_DEPARTMENT_OTHER): Payer: Self-pay | Admitting: Emergency Medicine

## 2020-10-03 DIAGNOSIS — Z20822 Contact with and (suspected) exposure to covid-19: Secondary | ICD-10-CM | POA: Insufficient documentation

## 2020-10-03 DIAGNOSIS — R059 Cough, unspecified: Secondary | ICD-10-CM

## 2020-10-03 DIAGNOSIS — J40 Bronchitis, not specified as acute or chronic: Secondary | ICD-10-CM | POA: Insufficient documentation

## 2020-10-03 LAB — SARS CORONAVIRUS 2 (TAT 6-24 HRS): SARS Coronavirus 2: NEGATIVE

## 2020-10-03 MED ORDER — METHOCARBAMOL 500 MG PO TABS: 500.0000 mg | ORAL_TABLET | Freq: Two times a day (BID) | ORAL | 0 refills | Status: DC

## 2020-10-03 MED ORDER — PROMETHAZINE-DM 6.25-15 MG/5ML PO SYRP: 5.0000 mL | ORAL_SOLUTION | Freq: Four times a day (QID) | ORAL | 0 refills | Status: DC | PRN

## 2020-10-03 MED ORDER — BENZONATATE 100 MG PO CAPS: 100.0000 mg | ORAL_CAPSULE | Freq: Three times a day (TID) | ORAL | 0 refills | Status: DC

## 2020-10-03 NOTE — Discharge Instructions (Addendum)
Viral Illness TREATMENT    I have prescribed you Tessalon Perles for cough.  You do have this listed as an allergy however the reaction was watery eyes.  I wonder if this may not have been a true allergic reaction but perhaps part of the illness that you were experienced at that time.  He may trial this medication and discontinue if it does not help or if you have adverse reactions.  Promethazine DM is a cough syrup please use as directed.  Please follow-up with your primary care doctor within the week.  Your COVID test is pending and will result in 24 hours. I have also prescribed you a muscle relaxer to use at night.  Treatment is directed at relieving symptoms. There is no cure. Antibiotics are not effective, because the infection is caused by a virus, not by bacteria. Treatment may include:  Increased fluid intake. Sports drinks offer valuable electrolytes, sugars, and fluids.  Breathing heated mist or steam (vaporizer or shower).  Eating chicken soup or other clear broths, and maintaining good nutrition.  Getting plenty of rest.  Using gargles or lozenges for comfort.  Increasing usage of your inhaler if you have asthma.  Return to work when your temperature has returned to normal.  Gargle warm salt water and spit it out for sore throat. Take benadryl to decrease sinus secretions. Continue to alternate between Tylenol and ibuprofen for pain and fever control.  Follow Up: Follow up with your primary care doctor in 5-7 days for recheck of ongoing symptoms.  Return to emergency department for emergent changing or worsening of symptoms.

## 2020-10-03 NOTE — ED Provider Notes (Signed)
[facility] EMERGENCY DEPARTMENT Provider Note   CSN: [id number] Arrival date & time: 10/03/20  1238     History Chief Complaint  Patient presents with  . Cough    Sabrina [name] is a 55 y.o. female.  HPI Patient is a 55 year old female with past medical history that is detailed below.  Presented today for cough and ongoing for the past several days.  She states that she seems to have multiple episodes of bronchitis that occur every winter.  She states that has been ongoing for the past 3 to 4 years.  She states that she has been having a dry cough mostly for the past week with occasional mucus and sputum production.  She was seen Christmastime and given Tussionex and is requesting refill of this however she is excepting of this not being prescribed today as it is a controlled substance and this is an ongoing cough.  She is not a smoker.  She denies any hemoptysis chest pain nausea vomiting or diarrhea.  No fevers or chills.  She states that she did have a fever over Christmas time which resolved.  She has been taking Tylenol 1 g approximately every 6 hours.  No other medications.  Associated symptoms include congestion and fatigue.  No other associated symptoms. No aggravating mitigating factors.  No recent surgeries, hospitalization, long travel, hemoptysis, estrogen containing OCP, cancer history.  No unilateral leg swelling.  No history of PE or VTE.     Past Medical History:  Diagnosis Date  . Arthritis    "knees" (09/06/2017)  . Bullous pemphigus   . Cat bite 06/2014   to left elbow  . History of blood transfusion 1988   "when I had my baby"  . Muscle weakness of lower extremity 2001; 09/05/2017   "resolved after a couple weeks; ?" (09/06/2017)  . 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 Poisoning, snake bite 04/08/2016   "[place]; RUE"  . PONV (postoperative nausea and vomiting)   . S/P right knee surgery   . Situational anxiety   . Staph infection ~ 2015    "left elbow and finger"    Patient Active Problem List   Diagnosis Date Noted  . Primary osteoarthritis of left knee 06/16/2020  . Effusion, left knee 06/16/2020  . Loose body in knee, left knee 06/16/2020  . Vitamin D deficiency 03/24/2020  . Hyperlipidemia 03/24/2020  . Pain in left foot 01/21/2020  . Primary osteoarthritis of right knee 08/06/2019  . 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 Other meniscus derangements, posterior horn of medial meniscus, left knee 02/19/2019  . Bucket handle tear of lateral meniscus 02/19/2019  . Unilateral primary osteoarthritis, right knee 12/13/2018  . Panic disorder 12/13/2018  . Chronic headaches 12/13/2018  . Weakness 09/06/2017  . Depression 09/06/2017  . 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  . Generalized anxiety disorder with panic attacks 09/06/2017  . Right hand pain   . Wrist swelling   . Cellulitis of right upper extremity 04/11/2016  . Elbow pain 07/20/2015  . Anxiety 07/20/2015  . Tachycardia 07/04/2015  . 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HCC)   . Skin ulcer of upper arm, limited to breakdown of skin (HCC) 07/01/2015    Past Surgical History:  Procedure Laterality Date  . APPENDECTOMY  ~ 1987  . APPLICATION OF A-CELL OF EXTREMITY Left 08/05/2015   Procedure: APPLICATION OF A-CELL OF EXTREMITY;  Surgeon: Peggye Form, DO;  Location: [facility];  Service: Plastics;  Laterality: Left;  .  BREAST SURGERY Right 1990   "milk duct taken out"  . CHONDROPLASTY Right 02/19/2019   Procedure: CHONDROPLASTY; EXCISION EXOSTOSIS;  Surgeon: Valeria BatmanWhitfield, Peter W, MD;  Location: [facility];  Service: Orthopedics;  Laterality: Right;  . DEBRIDEMENT AND CLOSURE WOUND Left 07/01/2015   Procedure: LEFT ELBOW EXCISION OF WOUND WITH PRIMARY CLOSURE 2X5 CM ;  Surgeon: Peggye Formlaire S Dillingham, DO;  Location: [facility];  Service: Plastics;  Laterality: Left;  . ELBOW SURGERY Left X 23 in [place] <06/2015   from a cat bite; all I&D  .  I & D EXTREMITY Left 07/08/2015   Procedure: IRRIGATION AND DEBRIDEMENT EXTREMITY, DRAINAGE OF LEFT ARM WOUND, A-CELL PLACEMENT, WOUND VAC PLACEMENT;  Surgeon: Alena Billslaire S Dillingham, DO;  Location: [facility];  Service: Plastics;  Laterality: Left;  . INCISION AND DRAINAGE OF WOUND Left 08/05/2015   Procedure: IRRIGATION AND DEBRIDEMENT LEFT ELBOW WOUND, PLACEMENT OF ACELL;  Surgeon: Peggye Formlaire S Dillingham, DO;  Location: [facility];  Service: Plastics;  Laterality: Left;  . KNEE ARTHROSCOPY WITH MEDIAL MENISECTOMY Right 02/19/2019   Procedure: RIGHT KNEE ARTHROSCOPY, DEBRIDEMENT, PARTIAL MEDIAL AND LATERAL MENISECTOMY;  Surgeon: Valeria BatmanWhitfield, Peter W, MD;  Location: [facility];  Service: Orthopedics;  Laterality: Right;  . LAPAROSCOPIC CHOLECYSTECTOMY  1998  . SKIN GRAFT Left 2016   took from anterior thigh; placed at elbow  . TONSILLECTOMY  ~ 2000  . TOTAL ABDOMINAL HYSTERECTOMY  2003  . WRIST SURGERY Right 01/2016     OB History   No obstetric history on file.     Family History  Problem Relation Age of Onset  . Liver disease Mother   . Dementia Mother   . Cirrhosis Mother   . Prostate cancer Father   . Colon cancer Maternal Grandmother   . Ovarian cancer Maternal Aunt     Social History   Tobacco Use  . Smoking status: Never Smoker  . Smokeless tobacco: Never Used  Vaping Use  . Vaping Use: Never used  Substance Use Topics  . Alcohol use: Not Currently    Alcohol/week: 0.0 standard drinks  . Drug use: No    Home Medications Prior to Admission medications   Medication Sig Start Date End Date Taking? Authorizing Provider  atorvastatin (LIPITOR) 20 MG tablet TAKE 1 TABLET BY MOUTH ONCE A DAY 09/29/20   Philip AspenHernandez Acosta, Limmie PatriciaEstela Y, MD  benzonatate (TESSALON) 100 MG capsule Take 1 capsule (100 mg total) by mouth every 8 (eight) hours. 10/03/20  Yes Arlyn Buerkle, Stevphen MeuseWylder S, PA  butalbital-acetaminophen-caffeine (FIORICET) 50-325-40 MG tablet TAKE 1 TABLET BY MOUTH  EVERY 6 HOURS AS NEEDED FOR HEADACHE 09/29/20   Philip AspenHernandez Acosta, Limmie PatriciaEstela Y, MD  methocarbamol (ROBAXIN) 500 MG tablet Take 1 tablet (500 mg total) by mouth 2 (two) times daily. 10/03/20  Yes Eliah Marquard S, PA  promethazine-dextromethorphan (PROMETHAZINE-DM) 6.25-15 MG/5ML syrup Take 5 mLs by mouth 4 (four) times daily as needed for cough. 10/03/20  Yes Royalty Domagala, Stevphen MeuseWylder S, PA  chlorpheniramine-HYDROcodone (TUSSIONEX PENNKINETIC ER) 10-8 MG/5ML SUER Take 5 mLs by mouth every 12 (twelve) hours as needed for cough. 09/04/20   Jacalyn LefevreHaviland, Julie, MD  diazepam (VALIUM) 10 MG tablet 1  qam   2  qhs 10/02/20   Plovsky, Earvin HansenGerald, MD  doxycycline (VIBRAMYCIN) 100 MG capsule Take 1 capsule (100 mg total) by mouth 2 (two) times daily. 09/04/20   Jacalyn LefevreHaviland, Julie, MD  gabapentin (NEURONTIN) 300 MG capsule Take 2 capsules (600 mg total) by mouth  3 (three) times daily. 08/18/20   Philip Aspen, Limmie Patricia, MD  predniSONE (DELTASONE) 20 MG tablet Take 1 tablet (20 mg total) by mouth 2 (two) times daily with a meal. 07/15/20   Nelwyn Salisbury, MD  traMADol (ULTRAM) 50 MG tablet Take 2 tablets (100 mg total) by mouth every 6 (six) hours as needed for moderate pain. Patient not taking: Reported on 08/18/2020 07/15/20   Nelwyn Salisbury, MD  venlafaxine XR (EFFEXOR-XR) 75 MG 24 hr capsule 1  qam 10/02/20   Archer Asa, MD    Allergies    Zofran Frazier Richards hcl], Diclofenac sodium, Droperidol, Flexeril [cyclobenzaprine], Morphine and related, and Tessalon [benzonatate]  Review of Systems   Review of Systems  Constitutional: Positive for fatigue. Negative for chills and fever.  HENT: Positive for congestion.   Eyes: Negative for pain.  Respiratory: Positive for cough. Negative for shortness of breath.   Cardiovascular: Negative for chest pain and leg swelling.  Gastrointestinal: Negative for abdominal pain and vomiting.  Genitourinary: Negative for dysuria.  Musculoskeletal: Negative for myalgias.  Skin: Negative for rash.   Neurological: Negative for dizziness and headaches.    Physical Exam Updated Vital Signs BP 116/86 (BP Location: Left Arm)   Pulse 97   Temp 98.2 F (36.8 C) (Oral)   Resp 18   Ht 5\' 4"  (1.626 m)   Wt 78 kg   SpO2 99%   BMI 29.52 kg/m   Physical Exam Vitals and nursing note reviewed.  Constitutional:      General: She is not in acute distress.    Comments: Pleasant well-appearing 55 year old female appears stated age.  In no acute distress.  Sitting comfortably in bed.  Able answer questions appropriately follow commands. No increased work of breathing. Speaking in full sentences.  HENT:     Head: Normocephalic and atraumatic.     Nose: Nose normal.  Eyes:     General: No scleral icterus. Cardiovascular:     Rate and Rhythm: Normal rate and regular rhythm.     Pulses: Normal pulses.     Heart sounds: Normal heart sounds.  Pulmonary:     Effort: Pulmonary effort is normal. No respiratory distress.     Breath sounds: No wheezing.     Comments: Clear to auscultation all fields.  Abdominal:     Palpations: Abdomen is soft.     Tenderness: There is no abdominal tenderness.  Musculoskeletal:     Cervical back: Normal range of motion.     Right lower leg: No edema.     Left lower leg: No edema.  Skin:    General: Skin is warm and dry.     Capillary Refill: Capillary refill takes less than 2 seconds.  Neurological:     Mental Status: She is alert. Mental status is at baseline.  Psychiatric:        Mood and Affect: Mood normal.        Behavior: Behavior normal.     ED Results / Procedures / Treatments   Labs (all labs ordered are listed, but only abnormal results are displayed) Labs Reviewed  SARS CORONAVIRUS 2 (TAT 6-24 HRS)    EKG None  Radiology  Procedures Procedures (including critical care time)  Medications Ordered in ED Medications - No data to display  ED Course  I have reviewed the triage vital signs and the nursing notes.  Pertinent labs &  imaging results that were available during my care of the patient were reviewed  by me and considered in my medical decision making (see chart for details).    MDM Rules/Calculators/A&P                          Patient is a 55 year old female who has a history of frequent episodes of bronchitis typically occur in the winter.  She has some congestion and cough.  She is overall very well-appearing afebrile lungs are clear to auscultation all fields.  There is no indication for further work-up at this time.  She is not having chest pain or shortness of breath.  Cough seems to be causing her to have difficulty sleeping at night therefore we will provide her with Promethazine DM and benzonatate.  Notably she was given Tussionex at last ER visit she does have a history noted in her Lorain Childes is that states that she does have some history of drug-seeking behavior.  She is calm and agreeable during my evaluation and understanding of not represcribing tussionex.   COVID test obtained pending at this time.  Patient has vital signs within normal limits.  SPO2 99% on room air heart rate is 84 she is PERC negative and I doubt PE. Most likely viral disease.  We will treat conservatively.  Also given Robaxin for her aches.  Sabrina [name] was evaluated in Emergency Department on 10/03/2020 for the symptoms described in the history of present illness. She was evaluated in the context of the global COVID-19 pandemic, which necessitated consideration that the patient might be at risk for infection with the SARS-CoV-2 virus that causes COVID-19. Institutional protocols and algorithms that pertain to the evaluation of patients at risk for COVID-19 are in a state of rapid change based on information released by regulatory bodies including the CDC and federal and state organizations. These policies and algorithms were followed during the patient's care in the ED.  Final Clinical Impression(s) / ED Diagnoses Final diagnoses:   Cough  Bronchitis  Suspected COVID-19 virus infection    Rx / DC Orders ED Discharge Orders         Ordered    benzonatate (TESSALON) 100 MG capsule  Every 8 hours        10/03/20 1448    promethazine-dextromethorphan (PROMETHAZINE-DM) 6.25-15 MG/5ML syrup  4 times daily PRN        10/03/20 1448    methocarbamol (ROBAXIN) 500 MG tablet  2 times daily        10/03/20 1448           Solon Augusta Zapata, [place] 10/03/20 1740    Alvira Monday, MD 10/05/20 1148

## 2020-10-05 ENCOUNTER — Telehealth (INDEPENDENT_AMBULATORY_CARE_PROVIDER_SITE_OTHER): Payer: BC Managed Care – PPO | Admitting: Family Medicine

## 2020-10-05 ENCOUNTER — Other Ambulatory Visit: Payer: Self-pay | Admitting: Orthopaedic Surgery

## 2020-10-05 DIAGNOSIS — R059 Cough, unspecified: Secondary | ICD-10-CM | POA: Diagnosis not present

## 2020-10-05 MED ORDER — TRAMADOL HCL 50 MG PO TABS: ORAL_TABLET | ORAL | 0 refills | Status: DC

## 2020-10-05 MED FILL — traMADol HCL 50 MG TABS: 50 | 15 days supply | Qty: 30 | Fill #0

## 2020-10-05 NOTE — Telephone Encounter (Signed)
I called, no answer.  ?

## 2020-10-05 NOTE — Progress Notes (Signed)
Patient ID: Sabrina [name], female   DOB: 05/19/1966, 55 y.o.   MRN: [id number]  This visit type was conducted due to national recommendations for restrictions regarding the COVID-19 pandemic in an effort to limit this patient's exposure and mitigate transmission in our community.   Virtual Visit via Video Note  I connected with Sabrina [name] on 10/05/20 at  9:45 AM EST by a video enabled telemedicine application and verified that I am speaking with the correct person using two identifiers.  Location patient: home Location provider:work or home office Persons participating in the virtual visit: patient, provider  I discussed the limitations of evaluation and management by telemedicine and the availability of in person appointments. The patient expressed understanding and agreed to proceed.   HPI:  Sabrina [name] called with cough which started around the middle December.  She went to the ER 12/24 had chest x-ray unremarkable that testing for flu and coronavirus that time which is negative.  She was prescribed doxycycline along with Tussionex cough syrup.  She had had previous intolerance with Tessalon.  She subsequently got prescription on second visit to the ER on 1/22 for promethazine cough syrup and had some upset stomach with that.  Her cough is dry.  No dyspnea.  No wheezing.  No GERD symptoms.  No postnasal drip.  Non-smoker.  No hemoptysis.  No appetite or weight changes.  No relief with over-the-counter medications.  Cough is severe at night and interfering with sleep.  Increased fatigue related to poor sleep.   ROS: See pertinent positives and negatives per HPI.  Past Medical History:  Diagnosis Date  . Arthritis    "knees" (09/06/2017)  . Bullous pemphigus   . Cat bite 06/2014   to left elbow  . History of blood transfusion 1988   "when I had my baby"  . Muscle weakness of lower extremity 2001; 09/05/2017   "resolved after a couple weeks; ?" (09/06/2017)  . 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HCC)    . Poisoning, snake bite 04/08/2016   "[place]; RUE"  . PONV (postoperative nausea and vomiting)   . S/P right knee surgery   . Situational anxiety   . Staph infection ~ 2015   "left elbow and finger"    Past Surgical History:  Procedure Laterality Date  . APPENDECTOMY  ~ 1987  . APPLICATION OF A-CELL OF EXTREMITY Left 08/05/2015   Procedure: APPLICATION OF A-CELL OF EXTREMITY;  Surgeon: Peggye Form, DO;  Location: [facility];  Service: Plastics;  Laterality: Left;  . BREAST SURGERY Right 1990   "milk duct taken out"  . CHONDROPLASTY Right 02/19/2019   Procedure: CHONDROPLASTY; EXCISION EXOSTOSIS;  Surgeon: Valeria Batman, MD;  Location: [facility];  Service: Orthopedics;  Laterality: Right;  . DEBRIDEMENT AND CLOSURE WOUND Left 07/01/2015   Procedure: LEFT ELBOW EXCISION OF WOUND WITH PRIMARY CLOSURE 2X5 CM ;  Surgeon: Peggye Form, DO;  Location: [facility];  Service: Plastics;  Laterality: Left;  . ELBOW SURGERY Left X 23 in [place] <06/2015   from a cat bite; all I&D  . I & D EXTREMITY Left 07/08/2015   Procedure: IRRIGATION AND DEBRIDEMENT EXTREMITY, DRAINAGE OF LEFT ARM WOUND, A-CELL PLACEMENT, WOUND VAC PLACEMENT;  Surgeon: Alena Bills Dillingham, DO;  Location: [facility];  Service: Plastics;  Laterality: Left;  . INCISION AND DRAINAGE OF WOUND Left 08/05/2015   Procedure: IRRIGATION AND DEBRIDEMENT LEFT ELBOW WOUND, PLACEMENT OF ACELL;  Surgeon: Alena Bills Dillingham, DO;  Location: [facility];  Service: Plastics;  Laterality: Left;  . KNEE ARTHROSCOPY WITH MEDIAL MENISECTOMY Right 02/19/2019   Procedure: RIGHT KNEE ARTHROSCOPY, DEBRIDEMENT, PARTIAL MEDIAL AND LATERAL MENISECTOMY;  Surgeon: Valeria Batman, MD;  Location: [facility];  Service: Orthopedics;  Laterality: Right;  . LAPAROSCOPIC CHOLECYSTECTOMY  1998  . SKIN GRAFT Left 2016   took from anterior thigh; placed at elbow  .  TONSILLECTOMY  ~ 2000  . TOTAL ABDOMINAL HYSTERECTOMY  2003  . WRIST SURGERY Right 01/2016    Family History  Problem Relation Age of Onset  . Liver disease Mother   . Dementia Mother   . Cirrhosis Mother   . Prostate cancer Father   . Colon cancer Maternal Grandmother   . Ovarian cancer Maternal Aunt     SOCIAL HX: Non-smoker   Current Outpatient Medications:  .  atorvastatin (LIPITOR) 20 MG tablet, TAKE 1 TABLET BY MOUTH ONCE A DAY, Disp: 90 tablet, Rfl: 1 .  chlorpheniramine-HYDROcodone (TUSSIONEX PENNKINETIC ER) 10-8 MG/5ML SUER, Take 5 mLs by mouth every 12 (twelve) hours as needed for cough., Disp: 140 mL, Rfl: 0 .  diazepam (VALIUM) 10 MG tablet, 1  qam   2  qhs, Disp: 90 tablet, Rfl: 5 .  predniSONE (DELTASONE) 20 MG tablet, Take 1 tablet (20 mg total) by mouth 2 (two) times daily with a meal., Disp: 30 tablet, Rfl: 0 .  promethazine-dextromethorphan (PROMETHAZINE-DM) 6.25-15 MG/5ML syrup, Take 5 mLs by mouth 4 (four) times daily as needed for cough., Disp: 118 mL, Rfl: 0 .  traMADol (ULTRAM) 50 MG tablet, Take 1 to 2 tablets at night as needed for severe cough, Disp: 30 tablet, Rfl: 0 .  venlafaxine XR (EFFEXOR-XR) 75 MG 24 hr capsule, 1  qam, Disp: 90 capsule, Rfl: 1  EXAM:  VITALS per patient if applicable:  GENERAL: alert, oriented, appears well and in no acute distress  HEENT: atraumatic, conjunttiva clear, no obvious abnormalities on inspection of external nose and ears  NECK: normal movements of the head and neck  LUNGS: on inspection no signs of respiratory distress, breathing rate appears normal, no obvious gross SOB, gasping or wheezing  CV: no obvious cyanosis  MS: moves all visible extremities without noticeable abnormality  PSYCH/NEURO: pleasant and cooperative, no obvious depression or anxiety, speech and thought processing grossly intact  ASSESSMENT AND PLAN:  Discussed the following assessment and plan:  Persistent cough.  Suspect post viral  bronchitis cough.  Patient has no red flags such as hemoptysis, weight loss, fever, dyspnea and recent testing as above including chest x-ray unremarkable  -We wrote for limited tramadol to try 1 to 2 tablets nightly to see if this suppresses her nighttime cough.  #30 with no refill.     I discussed the assessment and treatment plan with the patient. The patient was provided an opportunity to ask questions and all were answered. The patient agreed with the plan and demonstrated an understanding of the instructions.   The patient was advised to call back or seek an in-person evaluation if the symptoms worsen or if the condition fails to improve as anticipated.     Evelena Peat, MD

## 2020-10-05 NOTE — Telephone Encounter (Signed)
I checked her prescription history and she has received both tramadol and hydrocodone from 2 different physicians in the last 2 months- will need to check with them for further meds-thanks

## 2020-10-05 NOTE — Telephone Encounter (Signed)
I left voicemail for patient advising. 

## 2020-10-08 DIAGNOSIS — J019 Acute sinusitis, unspecified: Secondary | ICD-10-CM | POA: Diagnosis not present

## 2020-10-13 ENCOUNTER — Ambulatory Visit (HOSPITAL_COMMUNITY): Payer: Self-pay | Admitting: Clinical

## 2020-10-21 ENCOUNTER — Other Ambulatory Visit: Payer: Self-pay | Admitting: Internal Medicine

## 2020-10-21 DIAGNOSIS — G8929 Other chronic pain: Secondary | ICD-10-CM

## 2020-10-23 MED FILL — DIAZEPAM 10 MG TABS: 10 | 30 days supply | Qty: 90 | Fill #0

## 2020-10-24 ENCOUNTER — Encounter: Payer: Self-pay | Admitting: Internal Medicine

## 2020-10-27 ENCOUNTER — Other Ambulatory Visit: Payer: Self-pay | Admitting: Internal Medicine

## 2020-10-27 DIAGNOSIS — G8929 Other chronic pain: Secondary | ICD-10-CM

## 2020-10-27 DIAGNOSIS — R519 Headache, unspecified: Secondary | ICD-10-CM

## 2020-10-27 MED ORDER — BUTALBITAL-APAP-CAFFEINE 50-325-40 MG PO TABS
1.0000 | ORAL_TABLET | Freq: Four times a day (QID) | ORAL | 0 refills | Status: DC | PRN
Start: 2020-10-27 — End: 2020-10-27

## 2020-10-27 MED FILL — BUTALB-ACETAMIN-CAFF 50-325: 50-325-40 | 2 days supply | Qty: 10 | Fill #0

## 2020-10-27 NOTE — Addendum Note (Signed)
Addended by: Kern Reap B on: 10/27/2020 04:43 PM   Modules accepted: Orders

## 2020-11-04 MED FILL — BUTALB-ACETAMIN-CAFF 50-325: 50-325-40 | 2 days supply | Qty: 10 | Fill #1

## 2020-11-18 MED FILL — DIAZEPAM 10 MG TABS: 10 | 30 days supply | Qty: 90 | Fill #1

## 2020-12-08 MED FILL — GABAPENTIN 300 MG CAPSULE: 300 | 90 days supply | Qty: 540 | Fill #1

## 2020-12-09 ENCOUNTER — Other Ambulatory Visit: Payer: Self-pay | Admitting: Internal Medicine

## 2020-12-09 DIAGNOSIS — G8929 Other chronic pain: Secondary | ICD-10-CM

## 2020-12-09 DIAGNOSIS — R519 Headache, unspecified: Secondary | ICD-10-CM

## 2020-12-09 MED FILL — BUTALB-ACETAMIN-CAFF 50-325: 50-325-40 | 2 days supply | Qty: 10 | Fill #0

## 2020-12-16 ENCOUNTER — Other Ambulatory Visit (HOSPITAL_COMMUNITY): Payer: Self-pay

## 2020-12-16 MED FILL — Diazepam Tab 10 MG: ORAL | 30 days supply | Qty: 90 | Fill #0 | Status: AC

## 2020-12-17 ENCOUNTER — Other Ambulatory Visit (HOSPITAL_COMMUNITY): Payer: Self-pay

## 2020-12-21 ENCOUNTER — Other Ambulatory Visit (HOSPITAL_COMMUNITY): Payer: Self-pay

## 2020-12-21 MED FILL — Butalbital-Acetaminophen-Caffeine Tab 50-325-40 MG: ORAL | 3 days supply | Qty: 10 | Fill #0 | Status: AC

## 2020-12-22 ENCOUNTER — Telehealth (INDEPENDENT_AMBULATORY_CARE_PROVIDER_SITE_OTHER): Payer: BC Managed Care – PPO | Admitting: Family Medicine

## 2020-12-22 DIAGNOSIS — L120 Bullous pemphigoid: Secondary | ICD-10-CM | POA: Diagnosis not present

## 2020-12-22 DIAGNOSIS — R059 Cough, unspecified: Secondary | ICD-10-CM | POA: Diagnosis not present

## 2020-12-22 MED ORDER — ALBUTEROL SULFATE HFA 108 (90 BASE) MCG/ACT IN AERS: 2.0000 | INHALATION_SPRAY | Freq: Four times a day (QID) | RESPIRATORY_TRACT | 0 refills | Status: DC | PRN

## 2020-12-22 NOTE — Progress Notes (Signed)
Virtual Visit via Video Note  I connected with Lin  on 12/22/20 at  3:00 PM EDT by a video enabled telemedicine application and verified that I am speaking with the correct person using two identifiers.  Location patient: home, [place] Location provider:work or home office Persons participating in the virtual visit: patient, provider  I discussed the limitations of evaluation and management by telemedicine and the availability of in person appointments. The patient expressed understanding and agreed to proceed.   HPI:  Acute telemedicine visit for "bronchitis" and a flare of Bullous Pemphigoid: -sees two dermatologists for the BP -she is afraid to use prednisone or doxy for this as sometimes in the past it works, but sometimes it makes things worse -Onset: this flare started last week -Symptoms include: blisters on thighs and in her ears -Denies: fevers, malaise, purulent drainage, CP, SOB (has had negative covid testing) -she reports she does have "bronchitis" currently, reports she get this at this time of the year - reports usually lasts a few weeks, wants something for cough - albuterol has helped in the past -Has tried: has tried the topical steroids but messy to put on her ears -Pertinent past medical history: seeing dentist for tooth issues, had root canal last month for this, intermittent issues with this since - has been on abx several times, saw her dentist today   ROS: See pertinent positives and negatives per HPI.  Past Medical History:  Diagnosis Date  . Arthritis    "knees" (09/06/2017)  . Bullous pemphigus   . Cat bite 06/2014   to left elbow  . History of blood transfusion 1988   "when I had my baby"  . Muscle weakness of lower extremity 2001; 09/05/2017   "resolved after a couple weeks; ?" (09/06/2017)  . 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 Poisoning, snake bite 04/08/2016   "[place]; RUE"  . PONV (postoperative nausea and vomiting)   . S/P right knee surgery    . Situational anxiety   . Staph infection ~ 2015   "left elbow and finger"    Past Surgical History:  Procedure Laterality Date  . APPENDECTOMY  ~ 1987  . APPLICATION OF A-CELL OF EXTREMITY Left 08/05/2015   Procedure: APPLICATION OF A-CELL OF EXTREMITY;  Surgeon: Peggye Form, DO;  Location: [facility];  Service: Plastics;  Laterality: Left;  . BREAST SURGERY Right 1990   "milk duct taken out"  . CHONDROPLASTY Right 02/19/2019   Procedure: CHONDROPLASTY; EXCISION EXOSTOSIS;  Surgeon: Valeria Batman, MD;  Location: [facility];  Service: Orthopedics;  Laterality: Right;  . DEBRIDEMENT AND CLOSURE WOUND Left 07/01/2015   Procedure: LEFT ELBOW EXCISION OF WOUND WITH PRIMARY CLOSURE 2X5 CM ;  Surgeon: Peggye Form, DO;  Location: [facility];  Service: Plastics;  Laterality: Left;  . ELBOW SURGERY Left X 23 in [place] <06/2015   from a cat bite; all I&D  . I & D EXTREMITY Left 07/08/2015   Procedure: IRRIGATION AND DEBRIDEMENT EXTREMITY, DRAINAGE OF LEFT ARM WOUND, A-CELL PLACEMENT, WOUND VAC PLACEMENT;  Surgeon: Alena Bills Dillingham, DO;  Location: [facility];  Service: Plastics;  Laterality: Left;  . INCISION AND DRAINAGE OF WOUND Left 08/05/2015   Procedure: IRRIGATION AND DEBRIDEMENT LEFT ELBOW WOUND, PLACEMENT OF ACELL;  Surgeon: Peggye Form, DO;  Location: [facility];  Service: Plastics;  Laterality: Left;  . KNEE ARTHROSCOPY WITH MEDIAL MENISECTOMY Right 02/19/2019   Procedure: RIGHT KNEE ARTHROSCOPY,  DEBRIDEMENT, PARTIAL MEDIAL AND LATERAL MENISECTOMY;  Surgeon: Valeria Batman, MD;  Location:  SUR[facility]ERY [facility];  Service: Orthopedics;  Laterality: Right;  . LAPAROSCOPIC CHOLECYSTECTOMY  1998  . SKIN GRAFT Left 2016   took from anterior thigh; placed at elbow  . TONSILLECTOMY  ~ 2000  . TOTAL ABDOMINAL HYSTERECTOMY  2003  . WRIST SURGERY Right 01/2016     Current Outpatient Medications:  .   albuterol (PROAIR HFA) 108 (90 Base) MCG/ACT inhaler, Inhale 2 puffs into the lungs every 6 (six) hours as needed for wheezing or shortness of breath., Disp: 1 each, Rfl: 0 .  atorvastatin (LIPITOR) 20 MG tablet, TAKE 1 TABLET BY MOUTH ONCE A DAY, Disp: 90 tablet, Rfl: 1 .  butalbital-acetaminophen-caffeine (FIORICET) 50-325-40 MG tablet, TAKE 1 TABLET BY MOUTH EVERY 6 HOURS AS NEEDED FOR HEADACHE, Disp: 10 tablet, Rfl: 1 .  butalbital-acetaminophen-caffeine (FIORICET) 50-325-40 MG tablet, TAKE 1 TABLET BY MOUTH EVERY 6 HOURS AS NEEDED FOR HEADACHE, Disp: 10 tablet, Rfl: 1 .  chlorpheniramine-HYDROcodone (TUSSIONEX PENNKINETIC ER) 10-8 MG/5ML SUER, Take 5 mLs by mouth every 12 (twelve) hours as needed for cough., Disp: 140 mL, Rfl: 0 .  diazepam (VALIUM) 10 MG tablet, TAKE 1 TABLET BY MOUTH EVERY MORNING AND 2 TABLETS AT BEDTIME, Disp: 90 tablet, Rfl: 5 .  predniSONE (DELTASONE) 20 MG tablet, Take 1 tablet (20 mg total) by mouth 2 (two) times daily with a meal., Disp: 30 tablet, Rfl: 0 .  promethazine-dextromethorphan (PROMETHAZINE-DM) 6.25-15 MG/5ML syrup, Take 5 mLs by mouth 4 (four) times daily as needed for cough., Disp: 118 mL, Rfl: 0 .  traMADol (ULTRAM) 50 MG tablet, TAKE 1 TO 2 TABLETS AT NIGHT AS NEEDED FOR SEVERE COUGH, Disp: 30 tablet, Rfl: 0 .  venlafaxine XR (EFFEXOR-XR) 75 MG 24 hr capsule, TAKE 1 CAPSULE BY MOUTH EVERY MORNING, Disp: 90 capsule, Rfl: 1  EXAM:  VITALS per patient if applicable:  GENERAL: alert, oriented, appears well and in no acute distress  HEENT: atraumatic, conjunttiva clear, no obvious abnormalities on inspection of external nose and ears  NECK: normal movements of the head and neck  LUNGS: on inspection no signs of respiratory distress, breathing rate appears normal, no obvious gross SOB, gasping or wheezing  CV: no obvious cyanosis  MS: moves all visible extremities without noticeable abnormality  PSYCH/NEURO: pleasant and cooperative, no obvious  depression or anxiety, speech and thought processing grossly intact  ASSESSMENT AND PLAN:  Discussed the following assessment and plan:  Cough  Bullous pemphigoid  -we discussed possible serious and likely etiologies, options for evaluation and workup, limitations of telemedicine visit vs in person visit, treatment, treatment risks and precautions. Pt prefers to treat via telemedicine empirically rather than in person at this moment. For the bullous pemphigoid discussed various acute treatments including steroids, doxy, topical steroids - she is reluctant to try any of these as has had mixed results and topical not helping. She has agreed to call her dermatologist about this. For the cough, she has opted to try robitussin or delsym and albuterol as feels this has been helpful in the past. Also, she is starting allergy pill - zyrtec or allegra. Rx sent. Reports covid testing was negative and that this is a routine thing for her in the spring.  Advised to seek prompt in person care if worsening, new symptoms arise, or if is not improving with treatment. Discussed options for inperson care if PCP office not available. Did let this patient know that  I only do telemedicine on Tuesdays and Thursdays for [place]. Advised to schedule follow up visit with PCP or [facility] if any further questions or concerns to avoid delays in care.   I discussed the assessment and treatment plan with the patient. The patient was provided an opportunity to ask questions and all were answered. The patient agreed with the plan and demonstrated an understanding of the instructions.     Lucretia Kern, DO

## 2020-12-22 NOTE — Patient Instructions (Addendum)
-  call your dermatologist today about the skin issues  -can try the robitussin or delsym for the cough  -I sent the medication(s) we discussed to your pharmacy: Meds ordered this encounter  Medications  . albuterol (PROAIR HFA) 108 (90 Base) MCG/ACT inhaler    Sig: Inhale 2 puffs into the lungs every 6 (six) hours as needed for wheezing or shortness of breath.    Dispense:  1 each    Refill:  0     I hope you are feeling better soon!  Seek in person care promptly if your symptoms worsen, new concerns arise or you are not improving with treatment.  It was nice to meet you today. I help [facility] out with telemedicine visits on Tuesdays and Thursdays and am available for visits on those days. If you have any concerns or questions following this visit please schedule a follow up visit with your Primary Care doctor or seek care at a local urgent care clinic to avoid delays in care.

## 2020-12-23 ENCOUNTER — Telehealth (HOSPITAL_COMMUNITY): Payer: Self-pay | Admitting: Psychiatry

## 2021-01-01 ENCOUNTER — Other Ambulatory Visit (HOSPITAL_COMMUNITY): Payer: Self-pay

## 2021-01-01 MED FILL — Venlafaxine HCl Cap ER 24HR 75 MG (Base Equivalent): ORAL | 90 days supply | Qty: 90 | Fill #0 | Status: AC

## 2021-01-01 MED FILL — Atorvastatin Calcium Tab 20 MG (Base Equivalent): ORAL | 90 days supply | Qty: 90 | Fill #0 | Status: AC

## 2021-01-04 ENCOUNTER — Other Ambulatory Visit (HOSPITAL_COMMUNITY): Payer: Self-pay

## 2021-01-06 ENCOUNTER — Other Ambulatory Visit: Payer: Self-pay | Admitting: Internal Medicine

## 2021-01-06 DIAGNOSIS — G8929 Other chronic pain: Secondary | ICD-10-CM

## 2021-01-06 DIAGNOSIS — R519 Headache, unspecified: Secondary | ICD-10-CM

## 2021-01-07 ENCOUNTER — Other Ambulatory Visit (HOSPITAL_COMMUNITY): Payer: Self-pay

## 2021-01-12 ENCOUNTER — Encounter: Payer: Self-pay | Admitting: Internal Medicine

## 2021-01-12 ENCOUNTER — Other Ambulatory Visit (HOSPITAL_COMMUNITY): Payer: Self-pay

## 2021-01-12 ENCOUNTER — Other Ambulatory Visit: Payer: Self-pay | Admitting: Internal Medicine

## 2021-01-12 DIAGNOSIS — G8929 Other chronic pain: Secondary | ICD-10-CM

## 2021-01-12 MED ORDER — BUTALBITAL-APAP-CAFFEINE 50-325-40 MG PO TABS: ORAL_TABLET | ORAL | 1 refills | Status: DC

## 2021-01-13 ENCOUNTER — Telehealth: Payer: Self-pay | Admitting: Internal Medicine

## 2021-01-14 ENCOUNTER — Ambulatory Visit: Payer: BC Managed Care – PPO | Admitting: Family Medicine

## 2021-01-14 ENCOUNTER — Other Ambulatory Visit (HOSPITAL_COMMUNITY): Payer: Self-pay

## 2021-01-14 MED FILL — Diazepam Tab 10 MG: ORAL | 30 days supply | Qty: 90 | Fill #1 | Status: AC

## 2021-01-18 ENCOUNTER — Telehealth: Payer: Self-pay | Admitting: Internal Medicine

## 2021-01-18 NOTE — Telephone Encounter (Signed)
Patient is calling and stated that she is experiencing a cough and wanted to see if provider could work her in, please advise. CB is [phone]

## 2021-01-19 NOTE — Telephone Encounter (Signed)
Appointment scheduled.

## 2021-01-19 NOTE — Telephone Encounter (Signed)
I am full today, but offer her my open 9 am tomorrow.

## 2021-01-20 ENCOUNTER — Encounter: Payer: Self-pay | Admitting: Internal Medicine

## 2021-01-20 ENCOUNTER — Other Ambulatory Visit (HOSPITAL_COMMUNITY): Payer: Self-pay

## 2021-01-20 ENCOUNTER — Telehealth (INDEPENDENT_AMBULATORY_CARE_PROVIDER_SITE_OTHER): Payer: Self-pay | Admitting: Internal Medicine

## 2021-01-20 VITALS — Wt 173.0 lb

## 2021-01-20 DIAGNOSIS — R053 Chronic cough: Secondary | ICD-10-CM

## 2021-01-20 DIAGNOSIS — U071 COVID-19: Secondary | ICD-10-CM

## 2021-01-20 MED ORDER — NIRMATRELVIR/RITONAVIR (PAXLOVID)TABLET
3.0000 | ORAL_TABLET | Freq: Two times a day (BID) | ORAL | 0 refills | Status: AC
  Filled 2021-01-20: qty 30, 5d supply, fill #0

## 2021-01-20 NOTE — Progress Notes (Signed)
Virtual Visit via Video Note  I connected with Sabrina [name] on 01/20/21 at  9:00 AM EDT by a video enabled telemedicine application and verified that I am speaking with the correct person using two identifiers.  Location patient: home Location provider: work office Persons participating in the virtual visit: patient, provider  I discussed the limitations of evaluation and management by telemedicine and the availability of in person appointments. The patient expressed understanding and agreed to proceed.   HPI: She has scheduled this visit to discuss chronic coughing.  She tells me that she tested positive for COVID 6 days ago.  She has been quarantining at home and has been released from work for 10 days.  Other than the cough she does not have any major symptoms other than a little bit of weakness.  Of note, we have had concerns for drug-seeking behavior now for some time.  Her PDMP has an overdose risk score as of today of 600 and is flagged for both number of dispensing providers and number of pharmacies.  At this point, she has been denied scheduling by any other provider within the [place] primary care group other than myself in order to have better control of her drug-seeking tendencies.  Nonetheless she continues to see multiple other providers and in the month of April has received 3 narcotic and 2 benzodiazepine prescriptions. Unfortunately, I am concerned that her COVID test has been tampered with as the sample line is very dark and not straight.  I will insert picture below:    She has complained of a chronic cough now for years.  Per patient has not been responsive to over-the-counter cough syrups, Tessalon Perles, reflux medication or antihistamines.  She always requests Tussionex prescription, that seems to work best for her per report.  She had a chest x-ray in December 2021 that was reported as normal without acute findings.   ROS: Constitutional: Denies fever, chills,  diaphoresis, appetite change and fatigue.  HEENT: Denies photophobia, eye pain, redness, hearing loss, ear pain, congestion, sore throat, rhinorrhea, sneezing, mouth sores, trouble swallowing, neck pain, neck stiffness and tinnitus.   Respiratory: Denies SOB, DOE, chest tightness,  and wheezing.   Cardiovascular: Denies chest pain, palpitations and leg swelling.  Gastrointestinal: Denies nausea, vomiting, abdominal pain, diarrhea, constipation, blood in stool and abdominal distention.  Genitourinary: Denies dysuria, urgency, frequency, hematuria, flank pain and difficulty urinating.  Endocrine: Denies: hot or cold intolerance, sweats, changes in hair or nails, polyuria, polydipsia. Musculoskeletal: Denies myalgias, back pain, joint swelling, arthralgias and gait problem.  Skin: Denies pallor, rash and wound.  Neurological: Denies dizziness, seizures, syncope, weakness, light-headedness, numbness and headaches.  Hematological: Denies adenopathy. Easy bruising, personal or family bleeding history  Psychiatric/Behavioral: Denies suicidal ideation, mood changes, confusion, nervousness, sleep disturbance and agitation   Past Medical History:  Diagnosis Date  . Arthritis    "knees" (09/06/2017)  . Bullous pemphigus   . Cat bite 06/2014   to left elbow  . History of blood transfusion 1988   "when I had my baby"  . Muscle weakness of lower extremity 2001; 09/05/2017   "resolved after a couple weeks; ?" (09/06/2017)  . 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 Poisoning, snake bite 04/08/2016   "[place]; RUE"  . PONV (postoperative nausea and vomiting)   . S/P right knee surgery   . Situational anxiety   . Staph infection ~ 2015   "left elbow and finger"    Past Surgical History:  Procedure Laterality Date  . APPENDECTOMY  ~ 1987  . APPLICATION OF A-CELL OF EXTREMITY Left 08/05/2015   Procedure: APPLICATION OF A-CELL OF EXTREMITY;  Surgeon: Peggye Form, DO;  Location: [facility];  Service: Plastics;  Laterality: Left;  . BREAST SURGERY Right 1990   "milk duct taken out"  . CHONDROPLASTY Right 02/19/2019   Procedure: CHONDROPLASTY; EXCISION EXOSTOSIS;  Surgeon: Valeria Batman, MD;  Location: [facility];  Service: Orthopedics;  Laterality: Right;  . DEBRIDEMENT AND CLOSURE WOUND Left 07/01/2015   Procedure: LEFT ELBOW EXCISION OF WOUND WITH PRIMARY CLOSURE 2X5 CM ;  Surgeon: Peggye Form, DO;  Location: [facility];  Service: Plastics;  Laterality: Left;  . ELBOW SURGERY Left X 23 in [place] <06/2015   from a cat bite; all I&D  . I & D EXTREMITY Left 07/08/2015   Procedure: IRRIGATION AND DEBRIDEMENT EXTREMITY, DRAINAGE OF LEFT ARM WOUND, A-CELL PLACEMENT, WOUND VAC PLACEMENT;  Surgeon: Alena Bills Dillingham, DO;  Location: [facility];  Service: Plastics;  Laterality: Left;  . INCISION AND DRAINAGE OF WOUND Left 08/05/2015   Procedure: IRRIGATION AND DEBRIDEMENT LEFT ELBOW WOUND, PLACEMENT OF ACELL;  Surgeon: Peggye Form, DO;  Location: [facility];  Service: Plastics;  Laterality: Left;  . KNEE ARTHROSCOPY WITH MEDIAL MENISECTOMY Right 02/19/2019   Procedure: RIGHT KNEE ARTHROSCOPY, DEBRIDEMENT, PARTIAL MEDIAL AND LATERAL MENISECTOMY;  Surgeon: Valeria Batman, MD;  Location: [facility];  Service: Orthopedics;  Laterality: Right;  . LAPAROSCOPIC CHOLECYSTECTOMY  1998  . SKIN GRAFT Left 2016   took from anterior thigh; placed at elbow  . TONSILLECTOMY  ~ 2000  . TOTAL ABDOMINAL HYSTERECTOMY  2003  . WRIST SURGERY Right 01/2016    Family History  Problem Relation Age of Onset  . Liver disease Mother   . Dementia Mother   . Cirrhosis Mother   . Prostate cancer Father   . Colon cancer Maternal Grandmother   . Ovarian cancer Maternal Aunt     SOCIAL HX:   reports that she has never smoked. She has never used smokeless tobacco. She reports previous alcohol use. She reports that  she does not use drugs.   Current Outpatient Medications:  .  atorvastatin (LIPITOR) 20 MG tablet, TAKE 1 TABLET BY MOUTH ONCE A DAY, Disp: 90 tablet, Rfl: 1 .  butalbital-acetaminophen-caffeine (FIORICET) 50-325-40 MG tablet, TAKE 1 TABLET BY MOUTH EVERY 6 HOURS AS NEEDED FOR HEADACHE, Disp: 10 tablet, Rfl: 1 .  butalbital-acetaminophen-caffeine (FIORICET) 50-325-40 MG tablet, TAKE 1 TABLET BY MOUTH EVERY 6 HOURS AS NEEDED FOR HEADACHE, Disp: 10 tablet, Rfl: 1 .  diazepam (VALIUM) 10 MG tablet, TAKE 1 TABLET BY MOUTH EVERY MORNING AND 2 TABLETS AT BEDTIME, Disp: 90 tablet, Rfl: 5 .  venlafaxine XR (EFFEXOR-XR) 75 MG 24 hr capsule, TAKE 1 CAPSULE BY MOUTH EVERY MORNING, Disp: 90 capsule, Rfl: 1  EXAM:   VITALS per patient if applicable: None reported  GENERAL: alert, oriented, appears well and in no acute distress  HEENT: atraumatic, conjunttiva clear, no obvious abnormalities on inspection of external nose and ears  NECK: normal movements of the head and neck  LUNGS: on inspection no signs of respiratory distress, breathing rate appears normal, no obvious gross increased work of breathing, gasping or wheezing  CV: no obvious cyanosis  MS: moves all visible extremities without noticeable abnormality  PSYCH/NEURO: pleasant and cooperative, no obvious depression or anxiety, speech and thought processing  grossly intact  ASSESSMENT AND PLAN:   COVID-19 -I have to assume that she did test positive for COVID-19.  She does have a higher risk of hospitalization due to immunosuppression from her bullous pemphigoid. -I will go ahead and send a prescription for Paxlovid. -Have advised continued use of over-the-counter medications for cold and flu to include pain relievers, antihistamines, cough suppressants. -To do my due diligence, due to her chronic cough I will refer her to pulmonary to see if further work-up is necessary.  The only thing I can think of to add is a high-resolution CT scan.   Again she has been treated with both PPIs and antihistamines without success.    I discussed the assessment and treatment plan with the patient. The patient was provided an opportunity to ask questions and all were answered. The patient agreed with the plan and demonstrated an understanding of the instructions.   The patient was advised to call back or seek an in-person evaluation if the symptoms worsen or if the condition fails to improve as anticipated.  Time spent: 31 minutes, interviewing patient, reviewing charts, checking her controlled substance database and formulating plan of care.    Chaya Jan, MD  [facility]

## 2021-01-22 ENCOUNTER — Encounter (HOSPITAL_BASED_OUTPATIENT_CLINIC_OR_DEPARTMENT_OTHER): Payer: Self-pay

## 2021-01-22 ENCOUNTER — Emergency Department (HOSPITAL_BASED_OUTPATIENT_CLINIC_OR_DEPARTMENT_OTHER): Payer: Self-pay

## 2021-01-22 ENCOUNTER — Other Ambulatory Visit: Payer: Self-pay

## 2021-01-22 ENCOUNTER — Emergency Department (HOSPITAL_BASED_OUTPATIENT_CLINIC_OR_DEPARTMENT_OTHER)
Admission: EM | Admit: 2021-01-22 | Discharge: 2021-01-22 | Disposition: A | Discharge status: Home / Self Care | Payer: Self-pay | Attending: Emergency Medicine | Admitting: Emergency Medicine

## 2021-01-22 DIAGNOSIS — R053 Chronic cough: Secondary | ICD-10-CM

## 2021-01-22 DIAGNOSIS — R059 Cough, unspecified: Secondary | ICD-10-CM

## 2021-01-22 DIAGNOSIS — Z8616 Personal history of COVID-19: Secondary | ICD-10-CM | POA: Insufficient documentation

## 2021-01-22 NOTE — ED Triage Notes (Signed)
Covid + via home test 8 days ago.  Fever has subsided, but pt cannot get rid of cough despite meds from PCP yesterday and OTC cough suppressants/inhalers.  Denies CP/SOB.

## 2021-01-22 NOTE — Discharge Instructions (Addendum)
Your x-ray today did not show any signs of pneumonia, or pleural effusion.  Please continue taking the Paxlovid with that was prescribed by your primary care physician.  You will ultimately need a follow-up visit with pulmonology, please schedule this at your earliest convenience.

## 2021-01-22 NOTE — ED Provider Notes (Signed)
[facility] EMERGENCY DEPARTMENT Provider Note   CSN: [id number] Arrival date & time: 01/22/21  1030     History Chief Complaint  Patient presents with  . Cough    Sabrina [name] is a 55 y.o. female.  55 y.o female with a PMH of Bullous Pemphigus, arthritis, osteomyelitis presents to the ED with a chief complaint of cough.  According to patient she was diagnosed with COVID-19 testing positive approximately 6 days ago, had a telehealth visit with her PCP 2 days ago and began Paxil bid.  She reports started taking this medication yesterday.  However, she states that she has been feeling rundown, had multiple "coughing fit, where she lays down that she feels like it is worse ".  States while these episodes are happening she feels like "I am going to throw up ".  She has been using her albuterol inhaler which provides mild relief.  Primary care suggest that she be seen by pulmonologist as his cough has been chronic since her COVID-19 infection in 2020, however due to financial strain she is unsure when she will be able to follow-up with pulmonology.  She denies any smoking use, reports no tobacco use in the past.  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  No fever, chest pain, shortness of breath.    The history is provided by the patient and medical records.  Cough Cough characteristics:  Dry Sputum characteristics:  Nondescript Severity:  Moderate Onset quality:  Gradual Timing:  Constant Progression:  Unchanged Chronicity:  Chronic Smoker: no   Context: sick contacts and upper respiratory infection   Context: not exposure to allergens and not fumes   Relieved by:  Steroid inhaler and cough suppressants Worsened by:  Lying down Ineffective treatments:  Cough suppressants and steroid inhaler Associated symptoms: no chest pain, no chills, no myalgias, no shortness of breath and no sore throat        Past Medical History:   Diagnosis Date  . Arthritis    "knees" (09/06/2017)  . Bullous pemphigus   . Cat bite 06/2014   to left elbow  . History of blood transfusion 1988   "when I had my baby"  . Muscle weakness of lower extremity 2001; 09/05/2017   "resolved after a couple weeks; ?" (09/06/2017)  . 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 Poisoning, snake bite 04/08/2016   "[place]; RUE"  . PONV (postoperative nausea and vomiting)   . S/P right knee surgery   . Situational anxiety   . Staph infection ~ 2015   "left elbow and finger"    Patient Active Problem List   Diagnosis Date Noted  . Primary osteoarthritis of left knee 06/16/2020  . Effusion, left knee 06/16/2020  . Loose body in knee, left knee 06/16/2020  . Vitamin D deficiency 03/24/2020  . Hyperlipidemia 03/24/2020  . Pain in left foot 01/21/2020  . Primary osteoarthritis of right knee 08/06/2019  . 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 Other meniscus derangements, posterior horn of medial meniscus, left knee 02/19/2019  . Bucket handle tear of lateral meniscus 02/19/2019  . Unilateral primary osteoarthritis, right knee 12/13/2018  . Panic disorder 12/13/2018  . Chronic headaches 12/13/2018  . Weakness 09/06/2017  . Depression 09/06/2017  . 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  . Generalized anxiety disorder with panic attacks 09/06/2017  . Right hand pain   . Wrist swelling   . Cellulitis of right upper extremity 04/11/2016  . Elbow pain 07/20/2015  .  Anxiety 07/20/2015  . Tachycardia 07/04/2015  . 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HCC)   . Skin ulcer of upper arm, limited to breakdown of skin (HCC) 07/01/2015    Past Surgical History:  Procedure Laterality Date  . APPENDECTOMY  ~ 1987  . APPLICATION OF A-CELL OF EXTREMITY Left 08/05/2015   Procedure: APPLICATION OF A-CELL OF EXTREMITY;  Surgeon: Peggye Form, DO;  Location: [facility];  Service: Plastics;  Laterality: Left;  . BREAST SURGERY Right 1990   "milk duct taken out"  .  CHONDROPLASTY Right 02/19/2019   Procedure: CHONDROPLASTY; EXCISION EXOSTOSIS;  Surgeon: Valeria Batman, MD;  Location: [facility];  Service: Orthopedics;  Laterality: Right;  . DEBRIDEMENT AND CLOSURE WOUND Left 07/01/2015   Procedure: LEFT ELBOW EXCISION OF WOUND WITH PRIMARY CLOSURE 2X5 CM ;  Surgeon: Peggye Form, DO;  Location: [facility];  Service: Plastics;  Laterality: Left;  . ELBOW SURGERY Left X 23 in [place] <06/2015   from a cat bite; all I&D  . I & D EXTREMITY Left 07/08/2015   Procedure: IRRIGATION AND DEBRIDEMENT EXTREMITY, DRAINAGE OF LEFT ARM WOUND, A-CELL PLACEMENT, WOUND VAC PLACEMENT;  Surgeon: Alena Bills Dillingham, DO;  Location: [facility];  Service: Plastics;  Laterality: Left;  . INCISION AND DRAINAGE OF WOUND Left 08/05/2015   Procedure: IRRIGATION AND DEBRIDEMENT LEFT ELBOW WOUND, PLACEMENT OF ACELL;  Surgeon: Peggye Form, DO;  Location: [facility];  Service: Plastics;  Laterality: Left;  . KNEE ARTHROSCOPY WITH MEDIAL MENISECTOMY Right 02/19/2019   Procedure: RIGHT KNEE ARTHROSCOPY, DEBRIDEMENT, PARTIAL MEDIAL AND LATERAL MENISECTOMY;  Surgeon: Valeria Batman, MD;  Location: [facility];  Service: Orthopedics;  Laterality: Right;  . LAPAROSCOPIC CHOLECYSTECTOMY  1998  . SKIN GRAFT Left 2016   took from anterior thigh; placed at elbow  . TONSILLECTOMY  ~ 2000  . TOTAL ABDOMINAL HYSTERECTOMY  2003  . WRIST SURGERY Right 01/2016     OB History   No obstetric history on file.     Family History  Problem Relation Age of Onset  . Liver disease Mother   . Dementia Mother   . Cirrhosis Mother   . Prostate cancer Father   . Colon cancer Maternal Grandmother   . Ovarian cancer Maternal Aunt     Social History   Tobacco Use  . Smoking status: Never Smoker  . Smokeless tobacco: Never Used  Vaping Use  . Vaping Use: Never used  Substance Use Topics  . Alcohol use: Not Currently     Alcohol/week: 0.0 standard drinks  . Drug use: No    Home Medications Prior to Admission medications   Medication Sig Start Date End Date Taking? Authorizing Provider  atorvastatin (LIPITOR) 20 MG tablet TAKE 1 TABLET BY MOUTH ONCE A DAY 09/29/20 09/29/21 Yes Philip Aspen, Limmie Patricia, MD  butalbital-acetaminophen-caffeine (FIORICET) [phone] MG tablet TAKE 1 TABLET BY MOUTH EVERY 6 HOURS AS NEEDED FOR HEADACHE 01/12/21 01/12/22 Yes Henderson Cloud, MD  diazepam (VALIUM) 10 MG tablet TAKE 1 TABLET BY MOUTH EVERY MORNING AND 2 TABLETS AT BEDTIME 10/02/20 03/31/21 Yes Plovsky, Earvin Hansen, MD  nirmatrelvir/ritonavir EUA (PAXLOVID) TABS Take 3 tablets by mouth 2 (two) times daily for 5 days. 01/20/21 01/25/21 Yes Henderson Cloud, MD  venlafaxine XR (EFFEXOR-XR) 75 MG 24 hr capsule TAKE 1 CAPSULE BY MOUTH EVERY MORNING 10/02/20 10/02/21 Yes Plovsky, Earvin Hansen, MD  butalbital-acetaminophen-caffeine (FIORICET) 50-325-40 MG tablet TAKE 1 TABLET BY  MOUTH EVERY 6 HOURS AS NEEDED FOR HEADACHE 10/27/20 10/27/21  Philip AspenHernandez Acosta, Limmie PatriciaEstela Y, MD  gabapentin (NEURONTIN) 300 MG capsule Take 2 capsules (600 mg total) by mouth 3 (three) times daily. 08/18/20 10/05/20  Philip AspenHernandez Acosta, Limmie PatriciaEstela Y, MD    Allergies    Zofran Frazier Richards[ondansetron hcl], Diclofenac sodium, Droperidol, Flexeril [cyclobenzaprine], Morphine and related, and Tessalon [benzonatate]  Review of Systems   Review of Systems  Constitutional: Negative for chills.  HENT: Negative for sore throat.   Respiratory: Positive for cough. Negative for shortness of breath.   Cardiovascular: Negative for chest pain.  Musculoskeletal: Negative for myalgias.    Physical Exam Updated Vital Signs BP 121/87 (BP Location: Right Arm)   Pulse 82   Temp 98.2 F (36.8 C) (Oral)   Resp 20   Ht 5\' 4"  (1.626 m)   Wt 80.7 kg   SpO2 100%   BMI 30.55 kg/m   Physical Exam Vitals and nursing note reviewed.  Constitutional:      Appearance: Normal appearance.   HENT:     Head: Normocephalic and atraumatic.     Nose: No congestion or rhinorrhea.     Comments: No pain with palpation of the maxillary or frontal sinus.    Mouth/Throat:     Mouth: Mucous membranes are moist.     Comments: Oropharynx is clear without any exudates, or erythema. Eyes:     Extraocular Movements: Extraocular movements intact.     Pupils: Pupils are equal, round, and reactive to light.     Comments: 6 mm pupils bilaterally.   Neck:     Comments: No cervical adenopathy palpated.  Full range of motion of the neck. Cardiovascular:     Rate and Rhythm: Normal rate.  Pulmonary:     Effort: Pulmonary effort is normal.     Breath sounds: No wheezing or rales.     Comments: Lungs are clear to auscultation without any wheezing, rhonchi, rales. Abdominal:     General: Abdomen is flat.     Tenderness: There is no abdominal tenderness.  Musculoskeletal:        General: No tenderness.     Cervical back: Normal range of motion and neck supple.  Skin:    General: Skin is warm and dry.  Neurological:     Mental Status: She is alert and oriented to person, place, and time.     ED Results / Procedures / Treatments   Labs (all labs ordered are listed, but only abnormal results are displayed) Labs Reviewed - No data to display  EKG None  [facility]  Result Date: 01/22/2021 CLINICAL DATA:  Dry cough since COVID positive 8 days ago. EXAM: PORTABLE CHEST 1 VIEW COMPARISON:  Chest x-ray dated 09/04/2020 FINDINGS: Heart size and mediastinal contours are within normal limits. Lungs are clear. No pleural effusion or pneumothorax is seen. Osseous structures about the chest are unremarkable. IMPRESSION: No active cardiopulmonary disease. No evidence of pneumonia. Electronically Signed   By: Bary RichardStan  Maynard M.D.   On: 01/22/2021 11:29    Procedures Procedures   Medications Ordered in ED Medications - No data to display  ED Course  I have reviewed the triage  vital signs and the nursing notes.  Pertinent labs & imaging results that were available during my care of the patient were reviewed by me and considered in my medical decision making (see chart for details).    MDM Rules/Calculators/A&P     Patient arrives to  the ED with a chief complaint of cough, according to extensive chart review originally seen by PCP about 2 days ago.  Diagnosed with COVID about 8 days ago, according to it she was prescribed Paxil Bill to begin this medication.  During arrival to the ED vitals are within normal limits, she is normotensive, heart rate without any Tachycardia or signs of hypoxia and her vital signs.  During primary evaluation patient is overall nontoxic-appearing, she is in no distress without any coughing fits during our encounter.  Chest without any tenderness to palpation.  Oropharynx is clear without any erythema or exudates present.  No frontal or maxillary sinus pain with palpation.  Full range of motion of the neck.  No cervical adenopathy.  Lungs are clear to auscultation without any wheezing, rhonchi, rales.  No calf tenderness.  X-ray of her chest has been ordered to rule out any pneumonia.  She is currently on medication to help with her COVID-19 infection.I obtained information  from PCP's note explains concern for drug-seeking behavior for some time.  -There is a possibility the patient may have tampered with her own COVID-19 test, please see their photo inserted by Dr. Philip Aspen onto the chart.  According to the chart  "She has complained of a chronic cough now for years.  Per patient has not been responsive to over-the-counter cough syrups, Tessalon Perles, reflux medication or antihistamines.  She always requests Tussionex prescription, that seems to work best for her per report."  In addition, patient was prescribed Paxlovid by Dr. Loreta Ave about 2 days ago, she reports beginning to take this medication yesterday.  I did review her PDMP and  it I do see the 600 score, with some risk for her dose.  Prescriptions for tramadol, diazepam are current in the last month.  Xray of the chest showed: No active cardiopulmonary disease. No evidence of pneumonia.      These results were discussed at length with patient, she is agreeable to follow-up with primary care physician along with pulmonology.  Vitals remained stable, patient stable for discharge.   Portions of this note were generated with Scientist, clinical (histocompatibility and immunogenetics). Dictation errors may occur despite best attempts at proofreading.  Final Clinical Impression(s) / ED Diagnoses Final diagnoses:  Chronic cough    Rx / DC Orders ED Discharge Orders    None       Claude Manges, PA-C 01/22/21 1155    Sabino Donovan, MD 01/22/21 1435

## 2021-01-25 ENCOUNTER — Telehealth: Payer: Self-pay | Admitting: Internal Medicine

## 2021-01-25 NOTE — Telephone Encounter (Addendum)
Patient is calling and stated that she was seen last week but still is not feeling better and wanted to see what the provider recommends. Patient is aware that provider is out of the office and message will possible be answered tomorrow, please advise CB is [phone]

## 2021-01-26 NOTE — Telephone Encounter (Signed)
Message sent to mychart

## 2021-01-27 ENCOUNTER — Other Ambulatory Visit: Payer: Self-pay

## 2021-01-27 ENCOUNTER — Telehealth (INDEPENDENT_AMBULATORY_CARE_PROVIDER_SITE_OTHER): Payer: Self-pay | Admitting: Psychiatry

## 2021-01-27 DIAGNOSIS — F410 Panic disorder [episodic paroxysmal anxiety] without agoraphobia: Secondary | ICD-10-CM

## 2021-01-27 MED ORDER — DIAZEPAM 10 MG PO TABS: ORAL_TABLET | ORAL | 5 refills | Status: DC

## 2021-01-27 MED ORDER — VENLAFAXINE HCL ER 75 MG PO CP24: ORAL_CAPSULE | Freq: Every morning | ORAL | 1 refills | Status: DC
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
## 2021-02-12 ENCOUNTER — Other Ambulatory Visit (HOSPITAL_COMMUNITY): Payer: Self-pay

## 2021-02-12 MED ORDER — BUTALBITAL-APAP-CAFFEINE 50-325-40 MG PO TABS
1.0000 | ORAL_TABLET | Freq: Four times a day (QID) | ORAL | 0 refills | Status: DC | PRN
  Filled 2021-02-12: qty 10, 3d supply, fill #0

## 2021-02-26 ENCOUNTER — Other Ambulatory Visit (HOSPITAL_COMMUNITY): Payer: Self-pay

## 2021-03-02 ENCOUNTER — Encounter: Payer: Self-pay | Admitting: Internal Medicine

## 2021-03-02 ENCOUNTER — Other Ambulatory Visit (HOSPITAL_COMMUNITY): Payer: Self-pay

## 2021-03-02 DIAGNOSIS — G629 Polyneuropathy, unspecified: Secondary | ICD-10-CM

## 2021-03-02 MED ORDER — GABAPENTIN 300 MG PO CAPS: 600.0000 mg | ORAL_CAPSULE | Freq: Three times a day (TID) | ORAL | 1 refills | Status: DC

## 2021-03-02 MED ORDER — BUTALBITAL-APAP-CAFFEINE 50-325-40 MG PO TABS: 1.0000 | ORAL_TABLET | Freq: Four times a day (QID) | ORAL | 0 refills | Status: DC | PRN

## 2021-03-18 ENCOUNTER — Other Ambulatory Visit (HOSPITAL_COMMUNITY): Payer: Self-pay

## 2021-03-18 ENCOUNTER — Other Ambulatory Visit: Payer: Self-pay | Admitting: Internal Medicine

## 2021-03-18 ENCOUNTER — Encounter: Payer: Self-pay | Admitting: Internal Medicine

## 2021-03-19 ENCOUNTER — Encounter: Payer: Self-pay | Admitting: Internal Medicine

## 2021-03-19 MED ORDER — BUTALBITAL-APAP-CAFFEINE 50-325-40 MG PO TABS: 1.0000 | ORAL_TABLET | Freq: Four times a day (QID) | ORAL | 0 refills | Status: DC | PRN

## 2021-03-24 ENCOUNTER — Other Ambulatory Visit (HOSPITAL_COMMUNITY): Payer: Self-pay

## 2021-03-24 MED ORDER — DIAZEPAM 10 MG PO TABS: ORAL_TABLET | ORAL | 0 refills | Status: DC

## 2021-03-25 ENCOUNTER — Telehealth (HOSPITAL_COMMUNITY): Payer: Self-pay | Admitting: *Deleted
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
## 2021-03-30 ENCOUNTER — Other Ambulatory Visit: Payer: Self-pay | Admitting: Internal Medicine

## 2021-03-30 DIAGNOSIS — E785 Hyperlipidemia, unspecified: Secondary | ICD-10-CM

## 2021-03-31 ENCOUNTER — Other Ambulatory Visit (HOSPITAL_COMMUNITY): Payer: Self-pay

## 2021-03-31 MED ORDER — ATORVASTATIN CALCIUM 20 MG PO TABS
ORAL_TABLET | Freq: Every day | ORAL | 1 refills | Status: DC
  Filled 2021-03-31 – 2021-05-25 (×2): qty 90, 90d supply, fill #0
  Filled 2021-08-18: qty 90, 90d supply, fill #1

## 2021-04-06 ENCOUNTER — Encounter: Payer: Self-pay | Admitting: Internal Medicine

## 2021-04-07 ENCOUNTER — Other Ambulatory Visit (HOSPITAL_COMMUNITY): Payer: Self-pay

## 2021-04-16 ENCOUNTER — Encounter: Payer: Self-pay | Admitting: Internal Medicine

## 2021-04-16 DIAGNOSIS — R519 Headache, unspecified: Secondary | ICD-10-CM

## 2021-04-16 DIAGNOSIS — G8929 Other chronic pain: Secondary | ICD-10-CM

## 2021-04-20 MED ORDER — BUTALBITAL-APAP-CAFFEINE 50-325-40 MG PO TABS: ORAL_TABLET | ORAL | 0 refills | Status: DC

## 2021-04-20 MED ORDER — BUTALBITAL-APAP-CAFFEINE 50-325-40 MG PO TABS: 1.0000 | ORAL_TABLET | Freq: Four times a day (QID) | ORAL | 0 refills | Status: DC | PRN

## 2021-04-22 ENCOUNTER — Other Ambulatory Visit (HOSPITAL_COMMUNITY): Payer: Self-pay | Admitting: Psychiatry

## 2021-04-22 ENCOUNTER — Telehealth (HOSPITAL_COMMUNITY): Payer: Self-pay

## 2021-04-22 NOTE — Telephone Encounter (Signed)
Patient called regarding her Diazepam 10mg . She stated that she's completely out and asked about refills. Writer spoke with the pharmacist at [phone] [facility]. Pharmacist confirmed that #90 of patient's Diazepam was sent in on 03/24/21 and patient picked it up then so she should have a couple tablets left. Pharmacy has a script that was on hold until tomorrow, 8/12, so they're going to fill it and have it ready for patient tomorrow. Patient will be notified by the pharmacy when it's ready for pickup.   WRITER NOTIFIED PATIENT. SHE DIDN'T ANSWER THE PHONE SO WRITER LVM MESSAGE

## 2021-04-27 ENCOUNTER — Telehealth (INDEPENDENT_AMBULATORY_CARE_PROVIDER_SITE_OTHER): Payer: Self-pay | Admitting: Psychiatry

## 2021-04-27 ENCOUNTER — Other Ambulatory Visit: Payer: Self-pay

## 2021-04-27 DIAGNOSIS — F410 Panic disorder [episodic paroxysmal anxiety] without agoraphobia: Secondary | ICD-10-CM

## 2021-04-27 MED ORDER — VENLAFAXINE HCL ER 75 MG PO CP24: ORAL_CAPSULE | Freq: Every morning | ORAL | 1 refills | Status: DC

## 2021-04-27 MED ORDER — DIAZEPAM 10 MG PO TABS: ORAL_TABLET | ORAL | 3 refills | Status: DC

## 2021-04-27 MED ORDER — VENLAFAXINE HCL ER 150 MG PO CP24: 150.0000 mg | ORAL_CAPSULE | Freq: Every morning | ORAL | 1 refills | Status: DC
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
## 2021-05-01 ENCOUNTER — Ambulatory Visit (HOSPITAL_COMMUNITY): Payer: Self-pay

## 2021-05-25 ENCOUNTER — Other Ambulatory Visit (HOSPITAL_COMMUNITY): Payer: Self-pay

## 2021-05-26 ENCOUNTER — Telehealth (INDEPENDENT_AMBULATORY_CARE_PROVIDER_SITE_OTHER): Payer: Self-pay | Admitting: Internal Medicine

## 2021-05-26 ENCOUNTER — Encounter: Payer: Self-pay | Admitting: Internal Medicine

## 2021-05-26 VITALS — Temp 99.0°F | Wt 172.0 lb

## 2021-05-26 DIAGNOSIS — J069 Acute upper respiratory infection, unspecified: Secondary | ICD-10-CM

## 2021-05-26 NOTE — Progress Notes (Signed)
Virtual Visit via Telephone Note  I connected with Sabrina [name] on 05/26/21 at  7:00 AM EDT by telephone and verified that I am speaking with the correct person using two identifiers.   I discussed the limitations, risks, security and privacy concerns of performing an evaluation and management service by telephone and the availability of in person appointments. I also discussed with the patient that there may be a patient responsible charge related to this service. The patient expressed understanding and agreed to proceed.  Location patient: home Location provider: work office Participants present for the call: patient, provider Patient did not have a visit in the prior 7 days to address this/these issue(s).   History of Present Illness:  She has scheduled this visit to discuss URI symptoms.  Last week she was in [place] for 8 days, returned over the weekend on Monday afternoon she started experiencing myalgias, took a COVID test and was negative.  On Tuesday morning she had chills and a fever of 100.8 that has progressed to have a sore throat without exudates per report and ear pain and pressure.  She had called out of work and took another COVID test on Tuesday that was also negative.  Her work is requesting a note for the days that she called out.  She stills feels improving and her temperature today was 100.7 that came down to 99 after Tylenol.   Observations/Objective: Patient sounds cheerful and well on the phone. I do not appreciate any increased work of breathing. Speech and thought processing are grossly intact. Patient reported vitals: Temperature 100.7 this a.m.   Current Outpatient Medications:    atorvastatin (LIPITOR) 20 MG tablet, TAKE 1 TABLET BY MOUTH ONCE A DAY, Disp: 90 tablet, Rfl: 1   butalbital-acetaminophen-caffeine (FIORICET) 50-325-40 MG tablet, Take 1 tablet by mouth every 6 (six) hours as needed FOR HEADACHE., Disp: 10 tablet, Rfl: 0    butalbital-acetaminophen-caffeine (FIORICET) 50-325-40 MG tablet, TAKE 1 TABLET BY MOUTH EVERY 6 HOURS AS NEEDED FOR HEADACHE, Disp: 10 tablet, Rfl: 0   diazepam (VALIUM) 10 MG tablet, TAKE 1 TABLET BY MOUTH EVERY MORNING AND 2 TABLETS AT BEDTIME, Disp: 90 tablet, Rfl: 3   gabapentin (NEURONTIN) 300 MG capsule, Take 2 capsules (600 mg total) by mouth 3 (three) times daily., Disp: 540 capsule, Rfl: 1   venlafaxine XR (EFFEXOR-XR) 150 MG 24 hr capsule, Take 1 capsule (150 mg total) by mouth every morning., Disp: 90 capsule, Rfl: 1  Review of Systems:  Constitutional: Positive for fever, chills, diaphoresis, appetite change and fatigue.  HEENT: Denies photophobia, eye pain, redness, , mouth sores, trouble swallowing, neck pain, neck stiffness and tinnitus.   Respiratory: Denies SOB, DOE, cough, chest tightness,  and wheezing.   Cardiovascular: Denies chest pain, palpitations and leg swelling.  Gastrointestinal: Denies nausea, vomiting, abdominal pain, diarrhea, constipation, blood in stool and abdominal distention.  Genitourinary: Denies dysuria, urgency, frequency, hematuria, flank pain and difficulty urinating.  Endocrine: Denies: hot or cold intolerance, sweats, changes in hair or nails, polyuria, polydipsia. Musculoskeletal: Denies myalgias, back pain, joint swelling, arthralgias and gait problem.  Skin: Denies pallor, rash and wound.  Neurological: Denies dizziness, seizures, syncope, weakness, light-headedness, numbness and headaches.  Hematological: Denies adenopathy. Easy bruising, personal or family bleeding history  Psychiatric/Behavioral: Denies suicidal ideation, mood changes, confusion, nervousness, sleep disturbance and agitation   Assessment and Plan:  Viral upper respiratory tract infection  -Have asked her to redo COVID test this afternoon or tomorrow as she  may have had an early false negative. -She can use any OTC medications such as pain relievers, decongestants,  guaifenesin. -I will send her a work note via MyChart to ask that she stay out of work until tomorrow at which time if her COVID test is negative she may return to work immediately.  I discussed the assessment and treatment plan with the patient. The patient was provided an opportunity to ask questions and all were answered. The patient agreed with the plan and demonstrated an understanding of the instructions.   The patient was advised to call back or seek an in-person evaluation if the symptoms worsen or if the condition fails to improve as anticipated.  I provided 12 minutes of non-face-to-face time during this encounter.   Chaya Jan, MD [facility]

## 2021-05-27 ENCOUNTER — Ambulatory Visit (HOSPITAL_COMMUNITY): Payer: Self-pay

## 2021-05-27 ENCOUNTER — Encounter: Payer: Self-pay | Admitting: Internal Medicine

## 2021-05-27 ENCOUNTER — Emergency Department (INDEPENDENT_AMBULATORY_CARE_PROVIDER_SITE_OTHER)
Admission: RE | Admit: 2021-05-27 | Discharge: 2021-05-27 | Disposition: A | Discharge status: Home / Self Care | Payer: Self-pay | Source: Ambulatory Visit | Attending: Family Medicine | Admitting: Family Medicine

## 2021-05-27 ENCOUNTER — Other Ambulatory Visit: Payer: Self-pay

## 2021-05-27 VITALS — BP 114/79 | HR 94 | Temp 98.2°F | Resp 18

## 2021-05-27 DIAGNOSIS — J069 Acute upper respiratory infection, unspecified: Secondary | ICD-10-CM

## 2021-05-27 MED ORDER — PROMETHAZINE-DM 6.25-15 MG/5ML PO SYRP: 5.0000 mL | ORAL_SOLUTION | Freq: Four times a day (QID) | ORAL | 0 refills | Status: DC | PRN

## 2021-05-27 MED ORDER — PREDNISONE 20 MG PO TABS: 20.0000 mg | ORAL_TABLET | Freq: Two times a day (BID) | ORAL | 0 refills | Status: DC

## 2021-05-27 NOTE — Discharge Instructions (Addendum)
Continue to push fluids Use the albuterol inhaler as directed May use Phenergan DM for cough I have prescribed prednisone to take 2 a day for 5 days.  This also helps with wheezing and cough I am certain this is a viral infection that will not get better with antibiotics.  I expect improvement over the next couple of days. Call or return for problems

## 2021-05-27 NOTE — ED Triage Notes (Signed)
Pt c/o sore throat, cough and fever since Monday. Cough worsening in last two days. Taking Tylenol prn. Tmax 100.7 last night. COVID at home neg last night. Flu shot in July.

## 2021-05-27 NOTE — ED Provider Notes (Signed)
[facility]    CSN: [id number] Arrival date & time: 05/27/21  1612      History   Chief Complaint Chief Complaint  Patient presents with   Sore Throat   Cough    HPI Sabrina [name] is a 55 y.o. female.   HPI  Patient is here for an upper respiratory infection.  She has cough, runny nose, sore throat, fatigue, body aches, and fever for the last 4 days.  She has had 3 negative COVID tests.  She spoke with her family doctor about going back to work, and was told she could go back to work today.  She does not feel well enough.  She is here to be evaluated and obtain work note because her doctor was unable to see her today. Patient has had a lot of orthopedic problems but is otherwise fairly healthy.  She does have anxiety and depression under treatment.  History of migraines. No known exposure to COVID.  She is COVID vaccinated  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HCC)    Poisoning, snake bite 04/08/2016   "[place]; RUE"   PONV (postoperative nausea and vomiting)    S/P right knee surgery    Situational anxiety    Staph infection ~ 2015   "left elbow and finger"    Patient Active Problem List   Diagnosis Date Noted   Primary osteoarthritis of left knee 06/16/2020   Effusion, left knee 06/16/2020   Loose body in knee, left knee 06/16/2020   Vitamin D deficiency 03/24/2020   Hyperlipidemia 03/24/2020   Pain in left foot 01/21/2020   Primary osteoarthritis of right knee 08/06/2019   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   Other meniscus derangements, posterior horn of medial meniscus, left knee 02/19/2019   Bucket handle tear of lateral meniscus 02/19/2019   Unilateral primary  osteoarthritis, right knee 12/13/2018   Panic disorder 12/13/2018   Chronic headaches 12/13/2018   Weakness 09/06/2017   Depression 09/06/2017   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   Generalized anxiety disorder with panic attacks 09/06/2017   Right hand pain    Wrist swelling    Cellulitis of right upper extremity 04/11/2016   Elbow pain 07/20/2015   Anxiety 07/20/2015   Tachycardia 07/04/2015   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HCC)    Skin ulcer of upper arm, limited to breakdown of skin (HCC) 07/01/2015    Past Surgical History:  Procedure Laterality Date   APPENDECTOMY  ~ 1987   APPLICATION OF A-CELL OF EXTREMITY Left 08/05/2015   Procedure: APPLICATION OF A-CELL OF EXTREMITY;  Surgeon: Peggye Form, DO;  Location: [facility];  Service: Plastics;  Laterality: Left;   BREAST SURGERY Right 1990   "milk duct taken out"   CHONDROPLASTY Right 02/19/2019   Procedure: CHONDROPLASTY; EXCISION EXOSTOSIS;  Surgeon: Valeria Batman, MD;  Location: [facility];  Service: Orthopedics;  Laterality: Right;   DEBRIDEMENT AND CLOSURE WOUND Left 07/01/2015   Procedure: LEFT ELBOW EXCISION OF WOUND WITH PRIMARY CLOSURE 2X5 CM ;  Surgeon: Peggye Form, DO;  Location:  SURGERY CENTER;  Service: Plastics;  Laterality: Left;  ELBOW SURGERY Left X 23 in [place] <06/2015   from a cat bite; all I&D   I & D EXTREMITY Left 07/08/2015   Procedure: IRRIGATION AND DEBRIDEMENT EXTREMITY, DRAINAGE OF LEFT ARM WOUND, A-CELL PLACEMENT, WOUND VAC PLACEMENT;  Surgeon: Alena Bills Dillingham, DO;  Location: [facility];  Service: Plastics;  Laterality: Left;   INCISION AND DRAINAGE OF WOUND Left 08/05/2015   Procedure: IRRIGATION AND DEBRIDEMENT LEFT ELBOW WOUND, PLACEMENT OF ACELL;  Surgeon: Peggye Form, DO;  Location: [facility];  Service: Plastics;  Laterality: Left;   KNEE ARTHROSCOPY WITH MEDIAL MENISECTOMY Right 02/19/2019   Procedure: RIGHT KNEE ARTHROSCOPY,  DEBRIDEMENT, PARTIAL MEDIAL AND LATERAL MENISECTOMY;  Surgeon: Valeria Batman, MD;  Location: [facility];  Service: Orthopedics;  Laterality: Right;   LAPAROSCOPIC CHOLECYSTECTOMY  1998   SKIN GRAFT Left 2016   took from anterior thigh; placed at elbow   TONSILLECTOMY  ~ 2000   TOTAL ABDOMINAL HYSTERECTOMY  2003   WRIST SURGERY Right 01/2016    OB History   No obstetric history on file.      Home Medications    Prior to Admission medications   Medication Sig Start Date End Date Taking? Authorizing Provider  predniSONE (DELTASONE) 20 MG tablet Take 1 tablet (20 mg total) by mouth 2 (two) times daily with a meal. 05/27/21  Yes Eustace Moore, MD  promethazine-dextromethorphan (PROMETHAZINE-DM) 6.25-15 MG/5ML syrup Take 5 mLs by mouth 4 (four) times daily as needed for cough. 05/27/21  Yes Eustace Moore, MD  atorvastatin (LIPITOR) 20 MG tablet TAKE 1 TABLET BY MOUTH ONCE A DAY 03/31/21 03/31/22  Philip Aspen, Limmie Patricia, MD  butalbital-acetaminophen-caffeine (FIORICET) [phone] MG tablet Take 1 tablet by mouth every 6 (six) hours as needed FOR HEADACHE. 04/20/21   Philip Aspen, Limmie Patricia, MD  butalbital-acetaminophen-caffeine (FIORICET) [phone] MG tablet TAKE 1 TABLET BY MOUTH EVERY 6 HOURS AS NEEDED FOR HEADACHE 04/20/21 04/20/22  Philip Aspen, Limmie Patricia, MD  diazepam (VALIUM) 10 MG tablet TAKE 1 TABLET BY MOUTH EVERY MORNING AND 2 TABLETS AT BEDTIME 04/27/21 10/24/21  Plovsky, Earvin Hansen, MD  gabapentin (NEURONTIN) 300 MG capsule Take 2 capsules (600 mg total) by mouth 3 (three) times daily. 03/02/21   Philip Aspen, Limmie Patricia, MD  venlafaxine XR (EFFEXOR-XR) 150 MG 24 hr capsule Take 1 capsule (150 mg total) by mouth every morning. 04/27/21 04/27/22  Archer Asa, MD    Family History Family History  Problem Relation Age of Onset   Liver disease Mother    Dementia Mother    Cirrhosis Mother    Prostate cancer Father    Colon cancer Maternal Grandmother     Ovarian cancer Maternal Aunt     Social History Social History   Tobacco Use   Smoking status: Never   Smokeless tobacco: Never  Vaping Use   Vaping Use: Never used  Substance Use Topics   Alcohol use: Not Currently    Alcohol/week: 0.0 standard drinks   Drug use: No     Allergies   Zofran [ondansetron hcl], Diclofenac sodium, Droperidol, Flexeril [cyclobenzaprine], Morphine and related, and Tessalon [benzonatate]   Review of Systems Review of Systems   Physical Exam Triage Vital Signs ED Triage Vitals  Enc Vitals Group     BP 05/27/21 1627 114/79     Pulse Rate 05/27/21 1627 94     Resp 05/27/21 1627 18     Temp 05/27/21 1627 98.2 F (36.8 C)  Temp Source 05/27/21 1627 Oral     SpO2 05/27/21 1627 97 %     Weight --      Height --      Head Circumference --      Peak Flow --      Pain Score 05/27/21 1628 5     Pain Loc --      Pain Edu? --      Excl. in GC? --    No data found.  Updated Vital Signs BP 114/79 (BP Location: Right Arm)   Pulse 94   Temp 98.2 F (36.8 C) (Oral)   Resp 18   SpO2 97%      Physical Exam Constitutional:      General: She is not in acute distress.    Appearance: She is well-developed.     Comments: Appears tired  HENT:     Head: Normocephalic and atraumatic.     Right Ear: Ear canal normal. No tenderness. A middle ear effusion is present.     Left Ear: Ear canal normal. No tenderness. A middle ear effusion is present.     Nose: Congestion present. No rhinorrhea.     Mouth/Throat:     Mouth: Mucous membranes are moist.     Pharynx: Posterior oropharyngeal erythema present.     Tonsils: No tonsillar exudate or tonsillar abscesses. 0 on the right. 0 on the left.  Eyes:     Conjunctiva/sclera: Conjunctivae normal.     Pupils: Pupils are equal, round, and reactive to light.  Cardiovascular:     Rate and Rhythm: Normal rate and regular rhythm.     Heart sounds: Normal heart sounds.  Pulmonary:     Effort: Pulmonary  effort is normal. No respiratory distress.     Breath sounds: Wheezing and rhonchi present. No rales.     Comments: Few scattered rhonchi and end inspiratory wheeze Abdominal:     General: There is no distension.     Palpations: Abdomen is soft.  Musculoskeletal:        General: Normal range of motion.     Cervical back: Normal range of motion.  Lymphadenopathy:     Cervical: No cervical adenopathy.  Skin:    General: Skin is warm and dry.  Neurological:     General: No focal deficit present.     Mental Status: She is alert.  Psychiatric:        Mood and Affect: Mood normal.        Behavior: Behavior normal.     UC Treatments / Results  Labs (all labs ordered are listed, but only abnormal results are displayed) Labs Reviewed - No data to display  EKG   Radiology No results found.  Procedures Procedures (including critical care time)  Medications Ordered in UC Medications - No data to display  Initial Impression / Assessment and Plan / UC Course  I have reviewed the triage vital signs and the nursing notes.  Pertinent labs & imaging results that were available during my care of the patient were reviewed by me and considered in my medical decision making (see chart for details).     I do not believe additional COVID testing is indicated after 3 negative tests.  The most recent one was just last night.  She probably has a viral upper respiratory infection doubt influenza.  I explained to her that I do not believe antibiotics are going to help her.  We will give her cough medication and  prednisone.  She will continue on albuterol inhaler.  Is going to give her Tessalon for the cough but she reports an allergy (conjunctivitis?).  Follow-up as needed Final Clinical Impressions(s) / UC Diagnoses   Final diagnoses:  Viral URI with cough     Discharge Instructions      Continue to push fluids Use the albuterol inhaler as directed May use Phenergan DM for cough I  have prescribed prednisone to take 2 a day for 5 days.  This also helps with wheezing and cough I am certain this is a viral infection that will not get better with antibiotics.  I expect improvement over the next couple of days. Call or return for problems   ED Prescriptions     Medication Sig Dispense Auth. Provider   predniSONE (DELTASONE) 20 MG tablet Take 1 tablet (20 mg total) by mouth 2 (two) times daily with a meal. 10 tablet Eustace Moore, MD   promethazine-dextromethorphan (PROMETHAZINE-DM) 6.25-15 MG/5ML syrup Take 5 mLs by mouth 4 (four) times daily as needed for cough. 118 mL Eustace Moore, MD      PDMP not reviewed this encounter.   Eustace Moore, MD 05/27/21 [phone]

## 2021-05-29 ENCOUNTER — Telehealth: Payer: Self-pay

## 2021-05-29 NOTE — Telephone Encounter (Signed)
TC from pt d/t Promethazine cough syrup prescribed by Dr. Delton See yesterday at [facility] causing her to feel "jittery"--requesting another Rx. Discussed w/ Dr. Leron Croak to instruct pt to take OTC cough syrup like Delsym or Robitussin. Informed pt and she verbalizes understanding.

## 2021-06-22 ENCOUNTER — Other Ambulatory Visit (HOSPITAL_COMMUNITY): Payer: Self-pay

## 2021-06-22 MED ORDER — MELOXICAM 15 MG PO TABS
ORAL_TABLET | ORAL | 2 refills | Status: DC
  Filled 2021-06-22: qty 30, 30d supply, fill #0

## 2021-06-23 ENCOUNTER — Other Ambulatory Visit (HOSPITAL_COMMUNITY): Payer: Self-pay

## 2021-06-24 ENCOUNTER — Telehealth (HOSPITAL_COMMUNITY): Payer: Self-pay

## 2021-06-24 ENCOUNTER — Encounter: Payer: Self-pay | Admitting: Internal Medicine

## 2021-06-24 NOTE — Telephone Encounter (Signed)
Patient called and stated that she gets "Cluster Headaches "and due to that has already missed days from work. She stated that her PCP won't send in anything for the headaches and was told by her PCP that she needs to see a neurologist. She had already seen a neurologist in 2020 and was told that she doesn't need to see him for that and that her PCP should be taking care of it but she refuses to help the patient with this. Patient stated that she gets nauseous and it hurts above her left eyebrow. Patient is wondering if you can prescribe something for this one time that would help her. In the meantime, she's looking to switch her PCP. Patient stated that she can't afford to go to the ER or Urgent Care. Please review and advise. Thank you

## 2021-06-30 ENCOUNTER — Telehealth (HOSPITAL_COMMUNITY): Payer: Self-pay

## 2021-06-30 NOTE — Telephone Encounter (Signed)
Writer spoke with patient and relayed message from Dr. Donell Beers stating that he can't do anything to help patient due to it regarding "cluster headaches." He suggested she contact a neurologist that specializes in headaches and move forward with getting a new PCP

## 2021-07-01 ENCOUNTER — Telehealth: Payer: Self-pay | Admitting: Neurology

## 2021-07-01 ENCOUNTER — Other Ambulatory Visit (HOSPITAL_COMMUNITY): Payer: Self-pay

## 2021-07-01 NOTE — Telephone Encounter (Signed)
Patient called and requested a call back from a nurse. She said she's had a migraine for two days and been out of work.  Patient has not been seen since 2020 by Dr. Everlena Cooper.  She'd like to know if he has any suggestions to help her until her appointment on 07/09/21?

## 2021-07-01 NOTE — Telephone Encounter (Signed)
Pt advised of Dr.Jaffe note, I haven't seen her in over 2 years.  She needs to make an appointment.  If she needs immediate treatment then she may need to go to the ED.

## 2021-07-07 NOTE — Progress Notes (Addendum)
-  Virtual Visit via Video Note The purpose of this virtual visit is to provide medical care while limiting exposure to the novel coronavirus.    Consent was obtained for video visit:  Yes.   Answered questions that patient had about telehealth interaction:  Yes.   I discussed the limitations, risks, security and privacy concerns of performing an evaluation and management service by telemedicine. I also discussed with the patient that there may be a patient responsible charge related to this service. The patient expressed understanding and agreed to proceed.  Pt location: Home Physician Location: office Name of referring provider:  Philip Aspen, Almira Bar* I connected with Sabrina [name] at patients initiation/request on 07/09/2021 at 10:50 AM EDT by video enabled telemedicine application and verified that I am speaking with the correct person using two identifiers. Pt MRN:  [id number] Pt DOB:  06/24/1966 Video Participants:  Sabrina [name]  Assessment and Plan:   Migraine without aura, without status migrainosus, not intractable  Migraine prevention:  On venlafaxine XR 150mg  daily by her psychiatrist Migraine rescue:  Fioricet - discussed risk of rebound headache.  As her headaches are infrequent, I am okay with continuing it for now. Limit use of pain relievers to no more than 2 days out of week to prevent risk of rebound or medication-overuse headache. Keep headache diary Follow up one year   History of Present Illness:  Sabrina [name] is a 55 year old Caucasian woman with bullous pemphigoid and anxiety with panic disorder who follows up for migraines and tension-type headaches.   UPDATE: Last seen in September 2020.  At that time, venlafaxine XR was increased to 75mg  daily.  Since then, it has been increased to 150mg  daily.  She was living in October 2020 caring for her father who subsequently passed away.  She is now back in [place].  Intensity:  8/10 Duration:  Reduced intensity within 45-60  minutes with Fioricet and completely resolves after 4 hours. Frequency:  every 2 to 3 weeks  Frequency of abortive medication: 2 days a week Current NSAIDS:  none Current analgesics:  none Current triptans:  none Current ergotamine:  none Current anti-emetic:  promethazine 25mg   Current muscle relaxants:  none Current anti-anxiolytic:  Valium Current sleep aide:  none Current Antihypertensive medications:  none Current Antidepressant medications:  Venlafaxine XR 150mg  daily  Current Anticonvulsant medications:  Gabapentin 600mg  three times daily Current anti-CGRP:  none Current Vitamins/Herbal/Supplements:  D Current Antihistamines/Decongestants:  none Other therapy:  none Hormone/birth control:  none Other medications:  diazepam 10mg  AM and 20mg  QHS   Caffeine:  No coffee or colas Diet:  Hydrates with water.  Smooties Exercise:  Tries to walk but has trouble with her knees.  Likes to ride her horse but first needs knee surgery Depression/Anxiety:  anxiety with panic attacks.   Other pain:  pain due to the bullous pemphigoid.  Bilateral knee pain Sleep hygiene:  poor   HISTORY:  Onset:  Her late-20s Location:  Over left eyebrow Quality:  Stabbing, pounding, pressure Initial intensity:  8-9/10.  She denies new headache, thunderclap headache Aura:  No Premonitory Phase:  No Postdrome:  no Associated symptoms:  Nausea, vomiting.  She denies associated photophobia, phonophobia, unilateral numbness or weakness. Initial Duration:  Usually 1 day.  Longest lasted 3 days. Initial Frequency:  3 to 4 days a month but more frequently lately due to increased stress. Initial Frequency of abortive medication: Fioricet 1-2 days a week.  Excedrin most days a  week (for tension-type headache) Triggers:  Emotional stress Relieving factors:  Laying down Activity:  Aggravated when sitting up or standing upright She also has tension type headaches which are mild to moderate bifrontal pressure,  almost daily   Past NSAIDS:  naproxen, meloxicam Past analgesics:  tramadol, Excedrin, Goody powder, Tylenol, Fioricet Past abortive triptans:  rizatriptan 10mg  Past abortive ergotamine:  none Past muscle relaxants:  none Past anti-emetic:  Compazine 10mg , Phenergan, Zofran (allergy) Past antihypertensive medications:  none Past antidepressant medications:  Lexapro, Paxil, Zoloft Past anticonvulsant medications:  Topiramate 25mg  twice daily (on for one month) Past anti-CGRP:  none Past vitamins/Herbal/Supplements:  none Past antihistamines/decongestants:  none Other past therapies:  none   Family history of headache:  Father (migraines)  Past Medical History: 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HCC)    Poisoning, snake bite 04/08/2016   "[place]; RUE"   PONV (postoperative nausea and vomiting)    S/P right knee surgery    Situational anxiety    Staph infection ~ 2015   "left elbow and finger"    Medications: Outpatient Encounter Medications as of 07/09/2021  Medication Sig   atorvastatin (LIPITOR) 20 MG tablet TAKE 1 TABLET BY MOUTH ONCE A DAY   butalbital-acetaminophen-caffeine (FIORICET) 50-325-40 MG tablet Take 1 tablet by mouth every 6 (six) hours as needed FOR HEADACHE.   butalbital-acetaminophen-caffeine (FIORICET) 50-325-40 MG tablet TAKE 1 TABLET BY MOUTH EVERY 6 HOURS AS NEEDED FOR HEADACHE   diazepam (VALIUM) 10 MG tablet TAKE 1 TABLET BY MOUTH EVERY MORNING AND 2 TABLETS AT BEDTIME   gabapentin (NEURONTIN) 300 MG capsule Take 2 capsules (600 mg total) by mouth 3 (three) times daily.   meloxicam (MOBIC) 15 MG tablet Take one tablet by mouth daily with a meal. Stop taking if  increasing stomach discomfort or leg swelling.   predniSONE (DELTASONE) 20 MG tablet Take 1 tablet (20 mg total) by mouth 2 (two) times daily with a meal.   promethazine-dextromethorphan (PROMETHAZINE-DM) 6.25-15 MG/5ML syrup Take 5 mLs by mouth 4 (four) times daily as needed for cough.   venlafaxine XR (EFFEXOR-XR) 150 MG 24 hr capsule Take 1 capsule (150 mg total) by mouth every morning.   No facility-administered encounter medications on file as of 07/09/2021.    Allergies: Allergies  Allergen Reactions   Zofran [Ondansetron Hcl] Hives and Other (See Comments)    Spots around iv site   Diclofenac Sodium Rash   Droperidol Palpitations   Flexeril [Cyclobenzaprine] Anxiety   Morphine And Related Itching   Tessalon [Benzonatate] Other (See Comments)    Conjunctivitis, watery eyes.    Family History: Family History  Problem Relation Age of Onset   Liver disease Mother    Dementia Mother    Cirrhosis Mother    Prostate cancer Father    Colon cancer Maternal Grandmother    Ovarian cancer Maternal Aunt     Observations/Objective:   Height 5\' 4"  (1.626 m), weight 170 lb (77.1 kg). No acute distress.  Alert and oriented.  Speech fluent and not dysarthric.  Language intact.    Follow Up Instructions:    -I discussed the assessment and treatment  plan with the patient. The patient was provided an opportunity to ask questions and all were answered. The patient agreed with the plan and demonstrated an understanding of the instructions.   The patient was advised to call back or seek an in-person evaluation if the symptoms worsen or if the condition fails to improve as anticipated.  Dudley Major, DO

## 2021-07-09 ENCOUNTER — Encounter: Payer: Self-pay | Admitting: Neurology

## 2021-07-09 ENCOUNTER — Telehealth (INDEPENDENT_AMBULATORY_CARE_PROVIDER_SITE_OTHER): Payer: Self-pay | Admitting: Neurology

## 2021-07-09 ENCOUNTER — Other Ambulatory Visit: Payer: Self-pay

## 2021-07-09 VITALS — Ht 64.0 in | Wt 170.0 lb

## 2021-07-09 DIAGNOSIS — G43009 Migraine without aura, not intractable, without status migrainosus: Secondary | ICD-10-CM

## 2021-07-09 DIAGNOSIS — R519 Headache, unspecified: Secondary | ICD-10-CM

## 2021-07-09 DIAGNOSIS — G8929 Other chronic pain: Secondary | ICD-10-CM

## 2021-07-09 MED ORDER — BUTALBITAL-APAP-CAFFEINE 50-325-40 MG PO TABS: ORAL_TABLET | ORAL | 5 refills | Status: DC

## 2021-08-10 ENCOUNTER — Telehealth (HOSPITAL_COMMUNITY): Payer: Self-pay | Admitting: *Deleted

## 2021-08-10 ENCOUNTER — Encounter: Payer: Self-pay | Admitting: Internal Medicine

## 2021-08-10 DIAGNOSIS — G629 Polyneuropathy, unspecified: Secondary | ICD-10-CM

## 2021-08-10 MED ORDER — GABAPENTIN 300 MG PO CAPS: 600.0000 mg | ORAL_CAPSULE | Freq: Three times a day (TID) | ORAL | 1 refills | Status: AC

## 2021-08-18 ENCOUNTER — Other Ambulatory Visit (HOSPITAL_COMMUNITY): Payer: Self-pay

## 2021-08-18 ENCOUNTER — Other Ambulatory Visit (HOSPITAL_COMMUNITY): Payer: Self-pay | Admitting: Psychiatry

## 2021-08-18 MED ORDER — DIAZEPAM 10 MG PO TABS: 10.0000 mg | ORAL_TABLET | Freq: Three times a day (TID) | ORAL | 3 refills | Status: DC

## 2021-08-18 NOTE — Telephone Encounter (Signed)
Opened in error

## 2021-08-23 ENCOUNTER — Telehealth: Payer: Self-pay | Admitting: Emergency Medicine

## 2021-08-23 DIAGNOSIS — J069 Acute upper respiratory infection, unspecified: Secondary | ICD-10-CM

## 2021-08-23 MED ORDER — PROMETHAZINE-DM 6.25-15 MG/5ML PO SYRP: 5.0000 mL | ORAL_SOLUTION | Freq: Four times a day (QID) | ORAL | 0 refills | Status: DC | PRN

## 2021-08-23 NOTE — Patient Instructions (Signed)
Sabrina [name], thank you for joining Cathlyn Parsons, NP for today's virtual visit.  While this provider is not your primary care provider (PCP), if your PCP is located in our provider database this encounter information will be shared with them immediately following your visit.  Consent: (Patient) Sabrina [name] provided verbal consent for this virtual visit at the beginning of the encounter.  Current Medications:  Current Outpatient Medications:    albuterol (VENTOLIN HFA) 108 (90 Base) MCG/ACT inhaler, Inhale into the lungs every 6 (six) hours as needed., Disp: , Rfl:    atorvastatin (LIPITOR) 20 MG tablet, TAKE 1 TABLET BY MOUTH ONCE A DAY, Disp: 90 tablet, Rfl: 1   butalbital-acetaminophen-caffeine (FIORICET) 50-325-40 MG tablet, TAKE 1 TABLET BY MOUTH EVERY 6 HOURS AS NEEDED FOR HEADACHE, Disp: 10 tablet, Rfl: 5   diazepam (VALIUM) 10 MG tablet, Take 1 tablet (10 mg total) by mouth 3 (three) times daily., Disp: 90 tablet, Rfl: 3   gabapentin (NEURONTIN) 300 MG capsule, Take 2 capsules (600 mg total) by mouth 3 (three) times daily., Disp: 540 capsule, Rfl: 1   meloxicam (MOBIC) 15 MG tablet, Take one tablet by mouth daily with a meal. Stop taking if increasing stomach discomfort or leg swelling. (Patient not taking: Reported on 07/09/2021), Disp: 30 tablet, Rfl: 2   predniSONE (DELTASONE) 20 MG tablet, Take 1 tablet (20 mg total) by mouth 2 (two) times daily with a meal. (Patient not taking: Reported on 07/09/2021), Disp: 10 tablet, Rfl: 0   promethazine-dextromethorphan (PROMETHAZINE-DM) 6.25-15 MG/5ML syrup, Take 5 mLs by mouth 4 (four) times daily as needed for cough., Disp: 120 mL, Rfl: 0   venlafaxine XR (EFFEXOR-XR) 150 MG 24 hr capsule, Take 1 capsule (150 mg total) by mouth every morning., Disp: 90 capsule, Rfl: 1   Medications ordered in this encounter:  Meds ordered this encounter  Medications   promethazine-dextromethorphan (PROMETHAZINE-DM) 6.25-15 MG/5ML syrup    Sig: Take 5  mLs by mouth 4 (four) times daily as needed for cough.    Dispense:  120 mL    Refill:  0     *If you need refills on other medications prior to your next appointment, please contact your pharmacy*  Follow-Up: Call back or seek an in-person evaluation if the symptoms worsen or if the condition fails to improve as anticipated.  Other Instructions Continue to use Robitussin or you can stop using Robitussin and start using Mucinex (an extended release guaifenesin, which is the same medicine and Robitussin).  Use Flonase every day.  Stop using the Delsym if you are going to use the prescription cough syrup.  Use your inhaler as needed if you are feeling short of breath or wheezing.  If you do feel like your illness settles into your chest you can start the Medrol Dosepak that you already have at home.   If you have been instructed to have an in-person evaluation today at a local Urgent Care facility, please use the link below. It will take you to a list of all of our available [facility], including address, phone number and hours of operation. Please do not delay care.  [facility]  If you or a family member do not have a primary care provider, use the link below to schedule a visit and establish care. When you choose a [facility] primary care physician or advanced practice provider, you gain a long-term partner in health. Find a Primary Care Provider  Learn  more about [facility] in-office and virtual care options: [facility]

## 2021-08-23 NOTE — Progress Notes (Signed)
Virtual Visit Consent   TARSHIA KOT, you are scheduled for a virtual visit with a [facility] provider today.     Just as with appointments in the office, your consent must be obtained to participate.  Your consent will be active for this visit and any virtual visit you may have with one of our providers in the next 365 days.     If you have a MyChart account, a copy of this consent can be sent to you electronically.  All virtual visits are billed to your insurance company just like a traditional visit in the office.    As this is a virtual visit, video technology does not allow for your provider to perform a traditional examination.  This may limit your provider's ability to fully assess your condition.  If your provider identifies any concerns that need to be evaluated in person or the need to arrange testing (such as labs, EKG, etc.), we will make arrangements to do so.     Although advances in technology are sophisticated, we cannot ensure that it will always work on either your end or our end.  If the connection with a video visit is poor, the visit may have to be switched to a telephone visit.  With either a video or telephone visit, we are not always able to ensure that we have a secure connection.     I need to obtain your verbal consent now.   Are you willing to proceed with your visit today?    MARILEE DITOMMASO has provided verbal consent on 08/23/2021 for a virtual visit (video or telephone).   Cathlyn Parsons, NP   Date: 08/23/2021 4:01 PM   Virtual Visit via Video Note   I, Cathlyn Parsons, connected with  JENAN ELLEGOOD  ([id number], 02/19/1966) on 08/23/21 at  3:45 PM EST by a video-enabled telemedicine application and verified that I am speaking with the correct person using two identifiers.  Location: Patient: Virtual Visit Location Patient: Home Provider: Virtual Visit Location Provider: Home Office   I discussed the limitations of evaluation and management by telemedicine  and the availability of in person appointments. The patient expressed understanding and agreed to proceed.    History of Present Illness: RAYLA PEMBER is a 55 y.o. who identifies as a female who was assigned female at birth, and is being seen today for potential bronchitis.  Patient began feeling ill yesterday.  She describes frequent coughing and coughing fits.  She has had a chronic cough since she had COVID in 2020.  This feels worse than usual cough.  She is worried she might have bronchitis.  Is not coughing anything up.  Denies nasal drainage but does report postnasal drainage.  Has tested self for COVID at home and it is negative x2.  Had a low-grade temp of 99.9 F last night but no fever today.  No body aches, no sore throat.  Reports has an albuterol inhaler at home that she is using as needed at this time but it does not seem to help her symptoms at all.  Does not help her cough.  She does not feel like she has chest congestion, shortness of breath, chest tightness, wheezing.  She feels like the cough and drainage   HPI: HPI  Problems:  Patient Active Problem List   Diagnosis Date Noted   Primary osteoarthritis of left knee 06/16/2020   Effusion, left knee 06/16/2020   Loose body in knee, left  knee 06/16/2020   Vitamin D deficiency 03/24/2020   Hyperlipidemia 03/24/2020   Pain in left foot 01/21/2020   Primary osteoarthritis of right knee 08/06/2019   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   Other meniscus derangements, posterior horn of medial meniscus, left knee 02/19/2019   Bucket handle tear of lateral meniscus 02/19/2019   Unilateral primary osteoarthritis, right knee 12/13/2018   Panic disorder 12/13/2018   Chronic headaches 12/13/2018   Weakness 09/06/2017   Depression 09/06/2017   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   Generalized anxiety disorder with panic attacks 09/06/2017   Right hand pain    Wrist swelling    Cellulitis of right upper extremity 04/11/2016   Elbow pain 07/20/2015    Anxiety 07/20/2015   Tachycardia 07/04/2015   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   Medications:  Current Outpatient Medications:    albuterol (VENTOLIN HFA) 108 (90 Base) MCG/ACT inhaler, Inhale into the lungs every 6 (six) hours as needed., Disp: , Rfl:    atorvastatin (LIPITOR) 20 MG tablet, TAKE 1 TABLET BY MOUTH ONCE A DAY, Disp: 90 tablet, Rfl: 1   butalbital-acetaminophen-caffeine (FIORICET) 50-325-40 MG tablet, TAKE 1 TABLET BY MOUTH EVERY 6 HOURS AS NEEDED FOR HEADACHE, Disp: 10 tablet, Rfl: 5   diazepam (VALIUM) 10 MG tablet, Take 1 tablet (10 mg total) by mouth 3 (three) times daily., Disp: 90 tablet, Rfl: 3   gabapentin (NEURONTIN) 300 MG capsule, Take 2 capsules (600 mg total) by mouth 3 (three) times daily., Disp: 540 capsule, Rfl: 1   meloxicam (MOBIC) 15 MG tablet, Take one tablet by mouth daily with a meal. Stop taking if increasing stomach discomfort or leg swelling. (Patient not taking: Reported on 07/09/2021), Disp: 30 tablet, Rfl: 2   predniSONE (DELTASONE) 20 MG tablet, Take 1 tablet (20 mg total) by mouth 2 (two) times daily with a meal. (Patient not taking: Reported on 07/09/2021), Disp: 10 tablet, Rfl: 0   promethazine-dextromethorphan (PROMETHAZINE-DM) 6.25-15 MG/5ML syrup, Take 5 mLs by mouth 4 (four) times daily as needed for cough., Disp: 120 mL, Rfl: 0   venlafaxine XR (EFFEXOR-XR) 150 MG 24 hr capsule, Take 1 capsule (150 mg total) by mouth every morning., Disp: 90 capsule, Rfl: 1  Observations/Objective: Patient is well-developed, well-nourished in no acute distress.   Resting comfortably  at home.  Head is normocephalic, atraumatic.  No labored breathing.  Speech is clear and coherent with logical content.  Patient is alert and oriented at baseline.  Patient's voice is hoarse.  No cough is observed during this video visit.  Assessment and Plan: 1. Viral upper respiratory tract infection  I prescribed Promethazine DM for patient to use in place of Delsym.  She is to continue Robitussin or switch to Mucinex.  She is to use her Flonase every day.  She is to use her albuterol if needed for shortness of breath, wheezing.  If she feels her illness settles into her chest like a true bronchitis which she does not have at this time, she can start a Medrol Dosepak that she actually has already at home.  Follow Up Instructions: I discussed the assessment and treatment plan with the patient. The patient was provided an opportunity to ask questions and all were answered. The patient agreed with the plan and demonstrated an understanding of the instructions.  A copy of instructions were sent to the patient via MyChart unless otherwise noted below.   The patient was advised to call back or seek an in-person evaluation if the symptoms worsen or if the condition fails to improve as anticipated.  Time:  I spent 12 minutes with the patient via telehealth technology discussing the above problems/concerns.    Carvel Getting, NP

## 2021-08-25 ENCOUNTER — Other Ambulatory Visit: Payer: Self-pay

## 2021-08-25 ENCOUNTER — Telehealth (HOSPITAL_BASED_OUTPATIENT_CLINIC_OR_DEPARTMENT_OTHER): Payer: Self-pay | Admitting: Psychiatry

## 2021-08-25 DIAGNOSIS — F325 Major depressive disorder, single episode, in full remission: Secondary | ICD-10-CM

## 2021-08-25 MED ORDER — DIAZEPAM 10 MG PO TABS: 10.0000 mg | ORAL_TABLET | Freq: Three times a day (TID) | ORAL | 4 refills | Status: DC

## 2021-08-25 MED ORDER — VENLAFAXINE HCL ER 150 MG PO CP24: 150.0000 mg | ORAL_CAPSULE | Freq: Every morning | ORAL | 1 refills | Status: DC
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
## 2021-08-26 ENCOUNTER — Telehealth: Payer: Self-pay | Admitting: Physician Assistant

## 2021-08-26 DIAGNOSIS — J208 Acute bronchitis due to other specified organisms: Secondary | ICD-10-CM

## 2021-08-26 MED ORDER — PROMETHAZINE-DM 6.25-15 MG/5ML PO SYRP: 5.0000 mL | ORAL_SOLUTION | Freq: Four times a day (QID) | ORAL | 0 refills | Status: DC | PRN

## 2021-08-26 NOTE — Patient Instructions (Addendum)
°  Sabrina [name], thank you for joining [name], PA-C for today's virtual visit.  While this provider is not your primary care provider (PCP), if your PCP is located in our provider database this encounter information will be shared with them immediately following your visit.  Consent: (Patient) Sabrina [name] provided verbal consent for this virtual visit at the beginning of the encounter.  Current Medications:  Current Outpatient Medications:    albuterol (VENTOLIN HFA) 108 (90 Base) MCG/ACT inhaler, Inhale into the lungs every 6 (six) hours as needed., Disp: , Rfl:    atorvastatin (LIPITOR) 20 MG tablet, TAKE 1 TABLET BY MOUTH ONCE A DAY, Disp: 90 tablet, Rfl: 1   butalbital-acetaminophen-caffeine (FIORICET) 50-325-40 MG tablet, TAKE 1 TABLET BY MOUTH EVERY 6 HOURS AS NEEDED FOR HEADACHE, Disp: 10 tablet, Rfl: 5   diazepam (VALIUM) 10 MG tablet, Take 1 tablet (10 mg total) by mouth 3 (three) times daily., Disp: 90 tablet, Rfl: 4   gabapentin (NEURONTIN) 300 MG capsule, Take 2 capsules (600 mg total) by mouth 3 (three) times daily., Disp: 540 capsule, Rfl: 1   meloxicam (MOBIC) 15 MG tablet, Take one tablet by mouth daily with a meal. Stop taking if increasing stomach discomfort or leg swelling. (Patient not taking: Reported on 07/09/2021), Disp: 30 tablet, Rfl: 2   predniSONE (DELTASONE) 20 MG tablet, Take 1 tablet (20 mg total) by mouth 2 (two) times daily with a meal. (Patient not taking: Reported on 07/09/2021), Disp: 10 tablet, Rfl: 0   promethazine-dextromethorphan (PROMETHAZINE-DM) 6.25-15 MG/5ML syrup, Take 5 mLs by mouth 4 (four) times daily as needed for cough., Disp: 120 mL, Rfl: 0   venlafaxine XR (EFFEXOR-XR) 150 MG 24 hr capsule, Take 1 capsule (150 mg total) by mouth every morning., Disp: 90 capsule, Rfl: 1   Medications ordered in this encounter:  No orders of the defined types were placed in this encounter.    *If you need refills on other medications prior to your  next appointment, please contact your pharmacy*  Follow-Up: Call back or seek an in-person evaluation if the symptoms worsen or if the condition fails to improve as anticipated.  Other Instructions Please use the cough medication as directed to help further with cough. I have placed a refill on file at the pharmacy.  Make sure to use albuterol inhaler a couple of hours before bedtime.  Take the steroid taper as directed. Put a humidifier in the bedroom and run at night.  If symptoms are not starting to improve over next 48-72 hours or any worsening symptoms, you need re-evaluation, preferably in person.     If you have been instructed to have an in-person evaluation today at a local Urgent Care facility, please use the link below. It will take you to a list of all of our available [facility], including address, phone number and hours of operation. Please do not delay care.  [facility]  If you or a family member do not have a primary care provider, use the link below to schedule a visit and establish care. When you choose a [facility] primary care physician or advanced practice provider, you gain a long-term partner in health. Find a Primary Care Provider  Learn more about [facility]'s in-office and virtual care options: [facility] - Get Care Now

## 2021-08-26 NOTE — Progress Notes (Signed)
Virtual Visit Consent   Sabrina [name], you are scheduled for a virtual visit with a [facility] provider today.     Just as with appointments in the office, your consent must be obtained to participate.  Your consent will be active for this visit and any virtual visit you may have with one of our providers in the next 365 days.     If you have a MyChart account, a copy of this consent can be sent to you electronically.  All virtual visits are billed to your insurance company just like a traditional visit in the office.    As this is a virtual visit, video technology does not allow for your provider to perform a traditional examination.  This may limit your provider's ability to fully assess your condition.  If your provider identifies any concerns that need to be evaluated in person or the need to arrange testing (such as labs, EKG, etc.), we will make arrangements to do so.     Although advances in technology are sophisticated, we cannot ensure that it will always work on either your end or our end.  If the connection with a video visit is poor, the visit may have to be switched to a telephone visit.  With either a video or telephone visit, we are not always able to ensure that we have a secure connection.     I need to obtain your verbal consent now.   Are you willing to proceed with your visit today?    Sabrina [name] has provided verbal consent on 08/26/2021 for a virtual visit (video or telephone).   Sabrina [name], [place]   Date: 08/26/2021 12:01 PM   Virtual Visit via Video Note   I, Sabrina [name], connected with  Sabrina [name]  ([id number], 05/04/1966) on 08/26/21 at 11:45 AM EST by a video-enabled telemedicine application and verified that I am speaking with the correct person using two identifiers.  Location: Patient: Virtual Visit Location Patient: Home Provider: Virtual Visit Location Provider: Home Office   I discussed the limitations of evaluation and management by  telemedicine and the availability of in person appointments. The patient expressed understanding and agreed to proceed.    History of Present Illness: Sabrina [name] is a 55 y.o. who identifies as a female who was assigned female at birth, and is being seen today for continued cough. Was seen 2 days ago via video visit and diagnosed with a viral URI. Was given reassurance did not seem bacterial or COVID-related giving multiple negative tests at home. Was started on prescription cough medication and supportive measures reviewed.. She endorses taking her medication as directed with good improvement during the day. Nighttime still seems to be the worst and she is having breakthrough coughing spells, affecting sleep. Denies any other new or worsening symptom.  HPI: HPI  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   Other meniscus derangements, posterior horn of medial meniscus, left knee 02/19/2019   Bucket handle tear of lateral meniscus 02/19/2019   Unilateral primary osteoarthritis, right knee 12/13/2018   Panic disorder 12/13/2018   Chronic headaches 12/13/2018   Weakness  09/06/2017   Depression 09/06/2017   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   Generalized anxiety disorder with panic attacks 09/06/2017   Right hand pain    Wrist swelling    Cellulitis of right upper extremity 04/11/2016   Elbow pain 07/20/2015   Anxiety 07/20/2015   Tachycardia 07/04/2015   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HCC)    Skin ulcer of upper arm, limited to breakdown of skin (HCC) 07/01/2015    Allergies:  Allergies  Allergen Reactions   Zofran [Ondansetron Hcl] Hives and Other (See Comments)    Spots around iv  site   Diclofenac Sodium Rash   Droperidol Palpitations   Flexeril [Cyclobenzaprine] Anxiety   Morphine And Related Itching   Tessalon [Benzonatate] Other (See Comments)    Conjunctivitis, watery eyes.   Medications:  Current Outpatient Medications:    albuterol (VENTOLIN HFA) 108 (90 Base) MCG/ACT inhaler, Inhale into the lungs every 6 (six) hours as needed., Disp: , Rfl:    atorvastatin (LIPITOR) 20 MG tablet, TAKE 1 TABLET BY MOUTH ONCE A DAY, Disp: 90 tablet, Rfl: 1   butalbital-acetaminophen-caffeine (FIORICET) 50-325-40 MG tablet, TAKE 1 TABLET BY MOUTH EVERY 6 HOURS AS NEEDED FOR HEADACHE, Disp: 10 tablet, Rfl: 5   diazepam (VALIUM) 10 MG tablet, Take 1 tablet (10 mg total) by mouth 3 (three) times daily., Disp: 90 tablet, Rfl: 4   gabapentin (NEURONTIN) 300 MG capsule, Take 2 capsules (600 mg total) by mouth 3 (three) times daily., Disp: 540 capsule, Rfl: 1   meloxicam (MOBIC) 15 MG tablet, Take one tablet by mouth daily with a meal. Stop taking if increasing stomach discomfort or leg swelling. (Patient not taking: Reported on 07/09/2021), Disp: 30 tablet, Rfl: 2   promethazine-dextromethorphan (PROMETHAZINE-DM) 6.25-15 MG/5ML syrup, Take 5 mLs by mouth 4 (four) times daily as needed for cough., Disp: 120 mL, Rfl: 0   venlafaxine XR (EFFEXOR-XR) 150 MG 24 hr capsule, Take 1 capsule (150 mg total) by mouth every morning., Disp: 90 capsule, Rfl: 1  Observations/Objective: Patient is well-developed, well-nourished in no acute distress.  Resting comfortably at home.  Head is normocephalic, atraumatic.  No labored breathing. Speech is clear and coherent with logical content.  Patient is alert and oriented at baseline.   Assessment and Plan: 1. Acute viral bronchitis - promethazine-dextromethorphan (PROMETHAZINE-DM) 6.25-15 MG/5ML syrup; Take 5 mLs by mouth 4 (four) times daily as needed for cough.  Dispense: 120 mL; Refill: 0 Still with frequent cough. She just started her Medrol  dose pack given to her previously. She is to continue with this, taking as directed until completed. Resume use of albuterol 1-2 x daily for the next few days. Will put one additional fill of her cough medication on file at pharmacy that can be filled when due as her prior script will be out in a few days. Will need in-person evaluation for any continual/worsening symptoms at this point.   Follow Up Instructions: I discussed the assessment and treatment plan with the patient. The patient was provided an opportunity to ask questions and all were answered. The patient agreed with the plan and demonstrated an understanding of the instructions.  A copy of instructions were sent to the patient via MyChart unless otherwise noted below.   The patient was advised to call back or seek an in-person evaluation if the symptoms worsen or if the condition fails to improve as anticipated.  Time:  I spent 12 minutes with the patient via telehealth technology discussing the above problems/concerns.  Sabrina Climes, PA-C

## 2021-09-17 ENCOUNTER — Encounter: Payer: Self-pay | Admitting: Physician Assistant

## 2021-09-17 ENCOUNTER — Telehealth: Payer: Self-pay | Admitting: Physician Assistant

## 2021-09-17 DIAGNOSIS — U071 COVID-19: Secondary | ICD-10-CM

## 2021-09-17 DIAGNOSIS — J208 Acute bronchitis due to other specified organisms: Secondary | ICD-10-CM

## 2021-09-17 MED ORDER — PREDNISONE 10 MG (21) PO TBPK: ORAL_TABLET | ORAL | 0 refills | Status: DC

## 2021-09-17 MED ORDER — PROMETHAZINE-DM 6.25-15 MG/5ML PO SYRP: 5.0000 mL | ORAL_SOLUTION | Freq: Four times a day (QID) | ORAL | 0 refills | Status: DC | PRN

## 2021-09-17 MED ORDER — PSEUDOEPH-BROMPHEN-DM 30-2-10 MG/5ML PO SYRP: 5.0000 mL | ORAL_SOLUTION | Freq: Four times a day (QID) | ORAL | 0 refills | Status: DC | PRN

## 2021-09-17 MED ORDER — MOLNUPIRAVIR EUA 200MG CAPSULE: 4.0000 | ORAL_CAPSULE | Freq: Two times a day (BID) | ORAL | 0 refills | Status: AC

## 2021-09-17 NOTE — Progress Notes (Signed)
Virtual Visit Consent   Sabrina [name], you are scheduled for a virtual visit with a [facility] provider today.     Just as with appointments in the office, your consent must be obtained to participate.  Your consent will be active for this visit and any virtual visit you may have with one of our providers in the next 365 days.     If you have a MyChart account, a copy of this consent can be sent to you electronically.  All virtual visits are billed to your insurance company just like a traditional visit in the office.    As this is a virtual visit, video technology does not allow for your provider to perform a traditional examination.  This may limit your provider's ability to fully assess your condition.  If your provider identifies any concerns that need to be evaluated in person or the need to arrange testing (such as labs, EKG, etc.), we will make arrangements to do so.     Although advances in technology are sophisticated, we cannot ensure that it will always work on either your end or our end.  If the connection with a video visit is poor, the visit may have to be switched to a telephone visit.  With either a video or telephone visit, we are not always able to ensure that we have a secure connection.     I need to obtain your verbal consent now.   Are you willing to proceed with your visit today?    Sabrina [name] has provided verbal consent on 09/17/2021 for a virtual visit (video or telephone).   Margaretann Loveless, PA-C   Date: 09/17/2021 9:28 AM   Virtual Visit via Video Note   I, Margaretann Loveless, connected with  Sabrina [name]  ([id number], November 28, 1965) on 09/17/21 at  9:15 AM EST by a video-enabled telemedicine application and verified that I am speaking with the correct person using two identifiers.  Location: Patient: Virtual Visit Location Patient: Home Provider: Virtual Visit Location Provider: Home Office   I discussed the limitations of evaluation and management by  telemedicine and the availability of in person appointments. The patient expressed understanding and agreed to proceed.    History of Present Illness: Sabrina [name] is a 56 y.o. who identifies as a female who was assigned female at birth, and is being seen today for cough.  HPI: Cough This is a new problem. The current episode started in the past 7 days (Tuesday of this week (09/14/21)). The problem has been gradually worsening. The problem occurs every few minutes. The cough is Productive of sputum. Associated symptoms include chills, a fever (101.3 this morning), myalgias, nasal congestion, postnasal drip, rhinorrhea and a sore throat. Pertinent negatives include no shortness of breath or wheezing. She has tried OTC cough suppressant (tylenol) for the symptoms. The treatment provided no relief.     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   Other meniscus derangements, posterior horn of medial meniscus, left knee 02/19/2019   Bucket handle tear of lateral meniscus 02/19/2019   Unilateral primary osteoarthritis, right knee 12/13/2018   Panic disorder 12/13/2018  Chronic headaches 12/13/2018   Weakness 09/06/2017   Depression 09/06/2017   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   Generalized anxiety disorder with panic attacks 09/06/2017   Right hand pain    Wrist swelling    Cellulitis of right upper extremity 04/11/2016   Elbow pain 07/20/2015   Anxiety 07/20/2015   Tachycardia 07/04/2015   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HCC)    Skin ulcer of upper arm, limited to breakdown of skin (HCC) 07/01/2015    Allergies:  Allergies  Allergen Reactions   Zofran [Ondansetron Hcl] Hives and  Other (See Comments)    Spots around iv site   Diclofenac Sodium Rash   Droperidol Palpitations   Flexeril [Cyclobenzaprine] Anxiety   Morphine And Related Itching   Tessalon [Benzonatate] Other (See Comments)    Conjunctivitis, watery eyes.   Medications:  Current Outpatient Medications:    brompheniramine-pseudoephedrine-DM 30-2-10 MG/5ML syrup, Take 5 mLs by mouth 4 (four) times daily as needed., Disp: 120 mL, Rfl: 0   predniSONE (STERAPRED UNI-PAK 21 TAB) 10 MG (21) TBPK tablet, 6 day taper; take as directed on package instructions, Disp: 21 tablet, Rfl: 0   albuterol (VENTOLIN HFA) 108 (90 Base) MCG/ACT inhaler, Inhale into the lungs every 6 (six) hours as needed., Disp: , Rfl:    atorvastatin (LIPITOR) 20 MG tablet, TAKE 1 TABLET BY MOUTH ONCE A DAY, Disp: 90 tablet, Rfl: 1   butalbital-acetaminophen-caffeine (FIORICET) 50-325-40 MG tablet, TAKE 1 TABLET BY MOUTH EVERY 6 HOURS AS NEEDED FOR HEADACHE, Disp: 10 tablet, Rfl: 5   diazepam (VALIUM) 10 MG tablet, Take 1 tablet (10 mg total) by mouth 3 (three) times daily., Disp: 90 tablet, Rfl: 4   gabapentin (NEURONTIN) 300 MG capsule, Take 2 capsules (600 mg total) by mouth 3 (three) times daily., Disp: 540 capsule, Rfl: 1   meloxicam (MOBIC) 15 MG tablet, Take one tablet by mouth daily with a meal. Stop taking if increasing stomach discomfort or leg swelling. (Patient not taking: Reported on 07/09/2021), Disp: 30 tablet, Rfl: 2   promethazine-dextromethorphan (PROMETHAZINE-DM) 6.25-15 MG/5ML syrup, Take 5 mLs by mouth 4 (four) times daily as needed for cough., Disp: 120 mL, Rfl: 0   venlafaxine XR (EFFEXOR-XR) 150 MG 24 hr capsule, Take 1 capsule (150 mg total) by mouth every morning., Disp: 90 capsule, Rfl: 1  Observations/Objective: Patient is well-developed, well-nourished in no acute distress.  Resting comfortably at home.  Head is normocephalic, atraumatic.  No labored breathing.  Speech is clear and coherent with logical content.   Patient is alert and oriented at baseline.    Assessment and Plan: 1. Acute viral bronchitis - brompheniramine-pseudoephedrine-DM 30-2-10 MG/5ML syrup; Take 5 mLs by mouth 4 (four) times daily as needed.  Dispense: 120 mL; Refill: 0 - predniSONE (STERAPRED UNI-PAK 21 TAB) 10 MG (21) TBPK tablet; 6 day taper; take as directed on package instructions  Dispense: 21 tablet; Refill: 0  - Suspect bronchitis - Awaiting covid testing (will treat accordingly) - Bromfed DM and Prednisone prescribed - Steam treatments and humidifier - Push fluids - Rest  - Seek in person evaluation if symptoms worsen or fail to improve  Follow Up Instructions: I discussed the assessment and treatment plan with the patient. The patient was provided an opportunity to ask questions and all were answered. The patient agreed with the plan and demonstrated an understanding of the instructions.  A copy of instructions were sent to the patient via MyChart unless otherwise noted below.   The patient was advised to call  back or seek an in-person evaluation if the symptoms worsen or if the condition fails to improve as anticipated.  Time:  I spent 13 minutes with the patient via telehealth technology discussing the above problems/concerns.    Margaretann Loveless, PA-C

## 2021-09-17 NOTE — Patient Instructions (Signed)
Sabrina [name], thank you for joining Mar Daring, PA-C for today's virtual visit.  While this provider is not your primary care provider (PCP), if your PCP is located in our provider database this encounter information will be shared with them immediately following your visit.  Consent: (Patient) Sabrina [name] provided verbal consent for this virtual visit at the beginning of the encounter.  Current Medications:  Current Outpatient Medications:    brompheniramine-pseudoephedrine-DM 30-2-10 MG/5ML syrup, Take 5 mLs by mouth 4 (four) times daily as needed., Disp: 120 mL, Rfl: 0   predniSONE (STERAPRED UNI-PAK 21 TAB) 10 MG (21) TBPK tablet, 6 day taper; take as directed on package instructions, Disp: 21 tablet, Rfl: 0   albuterol (VENTOLIN HFA) 108 (90 Base) MCG/ACT inhaler, Inhale into the lungs every 6 (six) hours as needed., Disp: , Rfl:    atorvastatin (LIPITOR) 20 MG tablet, TAKE 1 TABLET BY MOUTH ONCE A DAY, Disp: 90 tablet, Rfl: 1   butalbital-acetaminophen-caffeine (FIORICET) 50-325-40 MG tablet, TAKE 1 TABLET BY MOUTH EVERY 6 HOURS AS NEEDED FOR HEADACHE, Disp: 10 tablet, Rfl: 5   diazepam (VALIUM) 10 MG tablet, Take 1 tablet (10 mg total) by mouth 3 (three) times daily., Disp: 90 tablet, Rfl: 4   gabapentin (NEURONTIN) 300 MG capsule, Take 2 capsules (600 mg total) by mouth 3 (three) times daily., Disp: 540 capsule, Rfl: 1   meloxicam (MOBIC) 15 MG tablet, Take one tablet by mouth daily with a meal. Stop taking if increasing stomach discomfort or leg swelling. (Patient not taking: Reported on 07/09/2021), Disp: 30 tablet, Rfl: 2   promethazine-dextromethorphan (PROMETHAZINE-DM) 6.25-15 MG/5ML syrup, Take 5 mLs by mouth 4 (four) times daily as needed for cough., Disp: 120 mL, Rfl: 0   venlafaxine XR (EFFEXOR-XR) 150 MG 24 hr capsule, Take 1 capsule (150 mg total) by mouth every morning., Disp: 90 capsule, Rfl: 1   Medications ordered in this encounter:  Meds ordered this  encounter  Medications   brompheniramine-pseudoephedrine-DM 30-2-10 MG/5ML syrup    Sig: Take 5 mLs by mouth 4 (four) times daily as needed.    Dispense:  120 mL    Refill:  0    Order Specific Question:   Supervising Provider    Answer:   MILLER, BRIAN [[id number]]   predniSONE (STERAPRED UNI-PAK 21 TAB) 10 MG (21) TBPK tablet    Sig: 6 day taper; take as directed on package instructions    Dispense:  21 tablet    Refill:  0    Order Specific Question:   Supervising Provider    Answer:   Sabrina [name], Sabrina [name]     *If you need refills on other medications prior to your next appointment, please contact your pharmacy*  Follow-Up: Call back or seek an in-person evaluation if the symptoms worsen or if the condition fails to improve as anticipated.  Other Instructions Acute Bronchitis, Adult Acute bronchitis is sudden inflammation of the main airways (bronchi) that come off the windpipe (trachea) in the lungs. The swelling causes the airways to get smaller and make more mucus than normal. This can make it hard to breathe and can cause coughing or noisy breathing (wheezing). Acute bronchitis may last several weeks. The cough may last longer. Allergies, asthma, and exposure to smoke may make the condition worse. What are the causes? This condition can be caused by germs and by substances that irritate the lungs, including: Cold and flu viruses. The most common cause of this condition is the virus  that causes the common cold. Bacteria. This is less common. Breathing in substances that irritate the lungs, including: Smoke from cigarettes and other forms of tobacco. Dust and pollen. Fumes from household cleaning products, gases, or burned fuel. Indoor or outdoor air pollution. What increases the risk? The following factors may make you more likely to develop this condition: A weak body's defense system, also called the immune system. A condition that affects your lungs and breathing, such as  asthma. What are the signs or symptoms? Common symptoms of this condition include: Coughing. This may bring up clear, yellow, or green mucus from your lungs (sputum). Wheezing. Runny or stuffy nose. Having too much mucus in your lungs (chest congestion). Shortness of breath. Aches and pains, including sore throat or chest. How is this diagnosed? This condition is usually diagnosed based on: Your symptoms and medical history. A physical exam. You may also have other tests, including tests to rule out other conditions, such as pneumonia. These tests include: A test of lung function. Test of a mucus sample to look for the presence of bacteria. Tests to check the oxygen level in your blood. Blood tests. Chest X-ray. How is this treated? Most cases of acute bronchitis clear up over time without treatment. Your health care provider may recommend: Drinking more fluids to help thin your mucus so it is easier to cough up. Taking inhaled medicine (inhaler) to improve air flow in and out of your lungs. Using a vaporizer or a humidifier. These are machines that add water to the air to help you breathe better. Taking a medicine that thins mucus and clears congestion (expectorant). Taking a medicine that prevents or stops coughing (cough suppressant). It is notcommon to take an antibiotic medicine for this condition. Follow these instructions at home:  Take over-the-counter and prescription medicines only as told by your health care provider. Use an inhaler, vaporizer, or humidifier as told by your health care provider. Take two teaspoons (10 mL) of honey at bedtime to lessen coughing at night. Drink enough fluid to keep your urine pale yellow. Do not use any products that contain nicotine or tobacco. These products include cigarettes, chewing tobacco, and vaping devices, such as e-cigarettes. If you need help quitting, ask your health care provider. Get plenty of rest. Return to your normal  activities as told by your health care provider. Ask your health care provider what activities are safe for you. Keep all follow-up visits. This is important. How is this prevented? To lower your risk of getting this condition again: Wash your hands often with soap and water for at least 20 seconds. If soap and water are not available, use hand sanitizer. Avoid contact with people who have cold symptoms. Try not to touch your mouth, nose, or eyes with your hands. Avoid breathing in smoke or chemical fumes. Breathing smoke or chemical fumes will make your condition worse. Get the flu shot every year. Contact a health care provider if: Your symptoms do not improve after 2 weeks. You have trouble coughing up the mucus. Your cough keeps you awake at night. You have a fever. Get help right away if you: Cough up blood. Feel pain in your chest. Have severe shortness of breath. Faint or keep feeling like you are going to faint. Have a severe headache. Have a fever or chills that get worse. These symptoms may represent a serious problem that is an emergency. Do not wait to see if the symptoms will go away. Get medical help  right away. Call your local emergency services (911 in the [place] Do not drive yourself to the hospital. Summary Acute bronchitis is inflammation of the main airways (bronchi) that come off the windpipe (trachea) in the lungs. The swelling causes the airways to get smaller and make more mucus than normal. Drinking more fluids can help thin your mucus so it is easier to cough up. Take over-the-counter and prescription medicines only as told by your health care provider. Do not use any products that contain nicotine or tobacco. These products include cigarettes, chewing tobacco, and vaping devices, such as e-cigarettes. If you need help quitting, ask your health care provider. Contact a health care provider if your symptoms do not improve after 2 weeks. This information is not  intended to replace advice given to you by your health care provider. Make sure you discuss any questions you have with your health care provider. Document Revised: 12/30/2020 Document Reviewed: 12/30/2020 Elsevier Patient Education  2022 [employer].    If you have been instructed to have an in-person evaluation today at a local Urgent Care facility, please use the link below. It will take you to a list of all of our available [facility], including address, phone number and hours of operation. Please do not delay care.  [facility]  If you or a family member do not have a primary care provider, use the link below to schedule a visit and establish care. When you choose a [facility] primary care physician or advanced practice provider, you gain a long-term partner in health. Find a Primary Care Provider  Learn more about 's in-office and virtual care options: [facility]

## 2021-09-20 ENCOUNTER — Telehealth: Payer: Self-pay | Admitting: Physician Assistant

## 2021-09-20 DIAGNOSIS — U071 COVID-19: Secondary | ICD-10-CM

## 2021-09-20 MED ORDER — PROMETHAZINE-DM 6.25-15 MG/5ML PO SYRP: 5.0000 mL | ORAL_SOLUTION | Freq: Four times a day (QID) | ORAL | 0 refills | Status: DC | PRN

## 2021-09-20 MED ORDER — LIDOCAINE VISCOUS HCL 2 % MT SOLN: OROMUCOSAL | 0 refills | Status: DC

## 2021-09-20 NOTE — Progress Notes (Signed)
Virtual Visit Consent   Sabrina [name], you are scheduled for a virtual visit with a [facility] provider today.     Just as with appointments in the office, your consent must be obtained to participate.  Your consent will be active for this visit and any virtual visit you may have with one of our providers in the next 365 days.     If you have a MyChart account, a copy of this consent can be sent to you electronically.  All virtual visits are billed to your insurance company just like a traditional visit in the office.    As this is a virtual visit, video technology does not allow for your provider to perform a traditional examination.  This may limit your provider's ability to fully assess your condition.  If your provider identifies any concerns that need to be evaluated in person or the need to arrange testing (such as labs, EKG, etc.), we will make arrangements to do so.     Although advances in technology are sophisticated, we cannot ensure that it will always work on either your end or our end.  If the connection with a video visit is poor, the visit may have to be switched to a telephone visit.  With either a video or telephone visit, we are not always able to ensure that we have a secure connection.     I need to obtain your verbal consent now.   Are you willing to proceed with your visit today?    Sabrina [name] has provided verbal consent on 09/20/2021 for a virtual visit (video or telephone).   Margaretann Loveless, PA-C   Date: 09/20/2021 10:29 AM   Virtual Visit via Video Note   I, Margaretann Loveless, connected with  Sabrina [name]  ([id number], 01/14/1966) on 09/20/21 at 10:15 AM EST by a video-enabled telemedicine application and verified that I am speaking with the correct person using two identifiers.  Location: Patient: Virtual Visit Location Patient: Home Provider: Virtual Visit Location Provider: Home Office   I discussed the limitations of evaluation and management by  telemedicine and the availability of in person appointments. The patient expressed understanding and agreed to proceed.    History of Present Illness: Sabrina [name] is a 56 y.o. who identifies as a female who was assigned female at birth, and is being seen today for coughing. Patient was seen on 09/17/21 and was diagnosed with Covid 19 and bronchitis. She was started on Molnupiravir, prednisone, and Promethazine DM. She reports her symptoms have continued to worsen. Having high fevers, cough, hoarse voice, severe sore throat. Has been taking tylenol for fever and using salt water gargles. Patient does have Bulbous pemphigoid and feels she is having the blisters in her mouth.    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   Other meniscus derangements, posterior horn of medial meniscus, left knee 02/19/2019   Bucket handle tear of lateral meniscus 02/19/2019   Unilateral primary osteoarthritis, right knee 12/13/2018   Panic disorder 12/13/2018   Chronic headaches 12/13/2018   Weakness 09/06/2017   Depression 09/06/2017   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  Generalized anxiety disorder with panic attacks 09/06/2017   Right hand pain    Wrist swelling    Cellulitis of right upper extremity 04/11/2016   Elbow pain 07/20/2015   Anxiety 07/20/2015   Tachycardia 07/04/2015   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HCC)    Skin ulcer of upper arm, limited to breakdown of skin (HCC) 07/01/2015    Allergies:  Allergies  Allergen Reactions   Zofran [Ondansetron Hcl] Hives and Other (See Comments)    Spots around iv site   Bromfed Dm [Pseudoeph-Bromphen-Dm] Other (See Comments)    anxiety   Diclofenac  Sodium Rash   Droperidol Palpitations   Flexeril [Cyclobenzaprine] Anxiety   Morphine And Related Itching   Tessalon [Benzonatate] Other (See Comments)    Conjunctivitis, watery eyes.   Medications:  Current Outpatient Medications:    lidocaine (XYLOCAINE) 2 % solution, 93mL swish and swallow as needed every 4 hours as needed for sore throat, Disp: 200 mL, Rfl: 0   albuterol (VENTOLIN HFA) 108 (90 Base) MCG/ACT inhaler, Inhale into the lungs every 6 (six) hours as needed., Disp: , Rfl:    atorvastatin (LIPITOR) 20 MG tablet, TAKE 1 TABLET BY MOUTH ONCE A DAY, Disp: 90 tablet, Rfl: 1   butalbital-acetaminophen-caffeine (FIORICET) 50-325-40 MG tablet, TAKE 1 TABLET BY MOUTH EVERY 6 HOURS AS NEEDED FOR HEADACHE, Disp: 10 tablet, Rfl: 5   diazepam (VALIUM) 10 MG tablet, Take 1 tablet (10 mg total) by mouth 3 (three) times daily., Disp: 90 tablet, Rfl: 4   gabapentin (NEURONTIN) 300 MG capsule, Take 2 capsules (600 mg total) by mouth 3 (three) times daily., Disp: 540 capsule, Rfl: 1   meloxicam (MOBIC) 15 MG tablet, Take one tablet by mouth daily with a meal. Stop taking if increasing stomach discomfort or leg swelling. (Patient not taking: Reported on 07/09/2021), Disp: 30 tablet, Rfl: 2   molnupiravir EUA (LAGEVRIO) 200 mg CAPS capsule, Take 4 capsules (800 mg total) by mouth 2 (two) times daily for 5 days., Disp: 40 capsule, Rfl: 0   predniSONE (STERAPRED UNI-PAK 21 TAB) 10 MG (21) TBPK tablet, 6 day taper; take as directed on package instructions, Disp: 21 tablet, Rfl: 0   promethazine-dextromethorphan (PROMETHAZINE-DM) 6.25-15 MG/5ML syrup, Take 5 mLs by mouth 4 (four) times daily as needed., Disp: 118 mL, Rfl: 0   venlafaxine XR (EFFEXOR-XR) 150 MG 24 hr capsule, Take 1 capsule (150 mg total) by mouth every morning., Disp: 90 capsule, Rfl: 1  Observations/Objective: Patient is well-developed, well-nourished in no acute distress.  Resting comfortably at home.  Head is normocephalic,  atraumatic.  No labored breathing.  Speech is clear and coherent with logical content.  Patient is alert and oriented at baseline.    Assessment and Plan: 1. COVID-19 - promethazine-dextromethorphan (PROMETHAZINE-DM) 6.25-15 MG/5ML syrup; Take 5 mLs by mouth 4 (four) times daily as needed.  Dispense: 118 mL; Refill: 0 - lidocaine (XYLOCAINE) 2 % solution; 65mL swish and swallow as needed every 4 hours as needed for sore throat  Dispense: 200 mL; Refill: 0  - Having flare of bulbous pemphigoid in mouth secondary to covid 19 - Viscous lidocaine added - Promethazine DM refilled - Continue prednisone and molnupiravir until completed - If symptoms continue to worsen seek in person evaluation to rule out pneumonia  Follow Up Instructions: I discussed the assessment and treatment plan with the patient. The patient was provided an opportunity to ask questions and all were answered. The patient agreed with the plan and demonstrated an understanding of  the instructions.  A copy of instructions were sent to the patient via MyChart unless otherwise noted below.    The patient was advised to call back or seek an in-person evaluation if the symptoms worsen or if the condition fails to improve as anticipated.  Time:  I spent 12 minutes with the patient via telehealth technology discussing the above problems/concerns.    Margaretann LovelessJennifer M Mahlik Lenn, PA-C

## 2021-09-20 NOTE — Patient Instructions (Signed)
Sabrina [name], thank you for joining Margaretann Loveless, PA-C for today's virtual visit.  While this provider is not your primary care provider (PCP), if your PCP is located in our provider database this encounter information will be shared with them immediately following your visit.  Consent: (Patient) Sabrina [name] provided verbal consent for this virtual visit at the beginning of the encounter.  Current Medications:  Current Outpatient Medications:    lidocaine (XYLOCAINE) 2 % solution, 41mL swish and swallow as needed every 4 hours as needed for sore throat, Disp: 200 mL, Rfl: 0   albuterol (VENTOLIN HFA) 108 (90 Base) MCG/ACT inhaler, Inhale into the lungs every 6 (six) hours as needed., Disp: , Rfl:    atorvastatin (LIPITOR) 20 MG tablet, TAKE 1 TABLET BY MOUTH ONCE A DAY, Disp: 90 tablet, Rfl: 1   butalbital-acetaminophen-caffeine (FIORICET) 50-325-40 MG tablet, TAKE 1 TABLET BY MOUTH EVERY 6 HOURS AS NEEDED FOR HEADACHE, Disp: 10 tablet, Rfl: 5   diazepam (VALIUM) 10 MG tablet, Take 1 tablet (10 mg total) by mouth 3 (three) times daily., Disp: 90 tablet, Rfl: 4   gabapentin (NEURONTIN) 300 MG capsule, Take 2 capsules (600 mg total) by mouth 3 (three) times daily., Disp: 540 capsule, Rfl: 1   meloxicam (MOBIC) 15 MG tablet, Take one tablet by mouth daily with a meal. Stop taking if increasing stomach discomfort or leg swelling. (Patient not taking: Reported on 07/09/2021), Disp: 30 tablet, Rfl: 2   molnupiravir EUA (LAGEVRIO) 200 mg CAPS capsule, Take 4 capsules (800 mg total) by mouth 2 (two) times daily for 5 days., Disp: 40 capsule, Rfl: 0   predniSONE (STERAPRED UNI-PAK 21 TAB) 10 MG (21) TBPK tablet, 6 day taper; take as directed on package instructions, Disp: 21 tablet, Rfl: 0   promethazine-dextromethorphan (PROMETHAZINE-DM) 6.25-15 MG/5ML syrup, Take 5 mLs by mouth 4 (four) times daily as needed., Disp: 118 mL, Rfl: 0   venlafaxine XR (EFFEXOR-XR) 150 MG 24 hr capsule, Take 1  capsule (150 mg total) by mouth every morning., Disp: 90 capsule, Rfl: 1   Medications ordered in this encounter:  Meds ordered this encounter  Medications   promethazine-dextromethorphan (PROMETHAZINE-DM) 6.25-15 MG/5ML syrup    Sig: Take 5 mLs by mouth 4 (four) times daily as needed.    Dispense:  118 mL    Refill:  0    Order Specific Question:   Supervising Provider    Answer:   MILLER, BRIAN [[id number]]   lidocaine (XYLOCAINE) 2 % solution    Sig: 55mL swish and swallow as needed every 4 hours as needed for sore throat    Dispense:  200 mL    Refill:  0    Order Specific Question:   Supervising Provider    Answer:   Eber Hong [[id number]]     *If you need refills on other medications prior to your next appointment, please contact your pharmacy*  Follow-Up: Call back or seek an in-person evaluation if the symptoms worsen or if the condition fails to improve as anticipated.  Other Instructions 10 Things You Can Do to Manage Your COVID-19 Symptoms at Home If you have possible or confirmed COVID-19 Stay home except to get medical care. Monitor your symptoms carefully. If your symptoms get worse, call your healthcare provider immediately. Get rest and stay hydrated. If you have a medical appointment, call the healthcare provider ahead of time and tell them that you have or may have COVID-19. For medical emergencies, call 911 and  notify the dispatch personnel that you have or may have COVID-19. Cover your cough and sneezes with a tissue or use the inside of your elbow. Wash your hands often with soap and water for at least 20 seconds or clean your hands with an alcohol-based hand sanitizer that contains at least 60% alcohol. As much as possible, stay in a specific room and away from other people in your home. Also, you should use a separate bathroom, if available. If you need to be around other people in or outside of the home, wear a mask. Avoid sharing personal items with other people in  your household, like dishes, towels, and bedding. Clean all surfaces that are touched often, like counters, tabletops, and doorknobs. Use household cleaning sprays or wipes according to the label instructions. [email or web address] 03/27/2020 This information is not intended to replace advice given to you by your health care provider. Make sure you discuss any questions you have with your health care provider. Document Revised: 05/21/2021 Document Reviewed: 05/21/2021 Elsevier Patient Education  2022 [employer].    If you have been instructed to have an in-person evaluation today at a local Urgent Care facility, please use the link below. It will take you to a list of all of our available [facility], including address, phone number and hours of operation. Please do not delay care.  [facility]  If you or a family member do not have a primary care provider, use the link below to schedule a visit and establish care. When you choose a [facility] primary care physician or advanced practice provider, you gain a long-term partner in health. Find a Primary Care Provider  Learn more about [facility]'s in-office and virtual care options: [facility] - Get Care Now

## 2021-09-24 ENCOUNTER — Ambulatory Visit
Admission: RE | Admit: 2021-09-24 | Discharge: 2021-09-24 | Disposition: A | Discharge status: Home / Self Care | Payer: BLUE CROSS/BLUE SHIELD | Source: Ambulatory Visit | Attending: Emergency Medicine | Admitting: Emergency Medicine

## 2021-09-24 ENCOUNTER — Other Ambulatory Visit: Payer: Self-pay

## 2021-09-24 VITALS — BP 101/66 | HR 116 | Temp 98.4°F | Resp 16

## 2021-09-24 DIAGNOSIS — G43709 Chronic migraine without aura, not intractable, without status migrainosus: Secondary | ICD-10-CM | POA: Diagnosis not present

## 2021-09-24 DIAGNOSIS — J209 Acute bronchitis, unspecified: Secondary | ICD-10-CM

## 2021-09-24 MED ORDER — SUMATRIPTAN SUCCINATE 50 MG PO TABS: ORAL_TABLET | ORAL | 0 refills | Status: DC

## 2021-09-24 MED ORDER — IBUPROFEN 400 MG PO TABS: 400.0000 mg | ORAL_TABLET | Freq: Three times a day (TID) | ORAL | 0 refills | Status: DC | PRN

## 2021-09-24 MED ORDER — FLUTICASONE FUROATE-VILANTEROL 200-25 MCG/ACT IN AEPB: 1.0000 | INHALATION_SPRAY | Freq: Every day | RESPIRATORY_TRACT | 1 refills | Status: DC

## 2021-09-24 MED ORDER — ALBUTEROL SULFATE HFA 108 (90 BASE) MCG/ACT IN AERS: 2.0000 | INHALATION_SPRAY | Freq: Four times a day (QID) | RESPIRATORY_TRACT | 1 refills | Status: DC | PRN

## 2021-09-24 MED ORDER — PROMETHAZINE-DM 6.25-15 MG/5ML PO SYRP: 5.0000 mL | ORAL_SOLUTION | Freq: Four times a day (QID) | ORAL | 0 refills | Status: DC | PRN

## 2021-09-24 NOTE — Discharge Instructions (Signed)
Please discuss migraine prevention therapy with your provider.  There are some wonderful injectable medications that can completely relieve your recurrent migraines.  For your cough, please begin Breo, 1 puff daily.  For the first few days, you may choose to inhale this twice daily for best benefit.  Please be sure to wash your mouth out after every use to remove any residual steroid from the oral cavity.  You can continue to use albuterol as often as you need for cough and shortness of breath.  Please also continue to use Flonase daily.  I also consider purchasing Sudafed from the pharmacy to take for the next several days to dry mucous membranes.  Please begin ibuprofen 400 mg 3 times daily for the next 3 to 4 days, this reduces inflammation in the lungs and can also decrease your cough.  I provided you with a prescription for Promethazine DM for nighttime cough.

## 2021-09-24 NOTE — ED Provider Notes (Signed)
[facility]    CSN: [id number] Arrival date & time: 09/24/21  1125    HISTORY   Chief Complaint  Sabrina [name] presents with   Cough   HPI JANUARY Sabrina [name] is a 56 y.o. female. Sabrina [name] presents to Urgent Care with complaints of a cough x few weeks. Pt states she had a virtual visit 4 days ago and was prescribed cough med/steroids. Pt states she tested positive for covid 2 weeks ago. Pt states she has also been using a humidifier. Last known fever 4 days ago.  Sabrina [name] denies shortness of breath, states cough is worse at night, states cough is nonproductive.  Sabrina [name] states she has been using albuterol with little relief.  Sabrina [name] complains of chronic migraines that occur 2-3 times a month.  Sabrina [name] states migraines are debilitating,, nausea, vomiting and photophobia.  Sabrina [name] states he has been prescribed Fioricet for this in the past.  Sabrina [name] states she is also tried sumatriptan, dose unknown, states she was never advised to take it with ibuprofen.  Sabrina [name] states sometimes Goody powders and Excedrin can help.  The history is provided by the Sabrina [name].  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[place])    Poisoning, snake bite 04/08/2016   "[place]; RUE"   PONV (postoperative nausea and vomiting)    S/P right knee surgery    Situational anxiety    Staph infection ~ 2015   "left elbow and finger"   Sabrina [name] Active Problem List   Diagnosis Date Noted   Primary osteoarthritis of left knee 06/16/2020   Effusion, left knee 06/16/2020   Loose body in knee, left knee 06/16/2020   Vitamin D deficiency 03/24/2020   Hyperlipidemia 03/24/2020   Pain in left foot 01/21/2020   Primary osteoarthritis of right  knee 08/06/2019   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   Other meniscus derangements, posterior horn of medial meniscus, left knee 02/19/2019   Bucket handle tear of lateral meniscus 02/19/2019   Unilateral primary osteoarthritis, right knee 12/13/2018   Panic disorder 12/13/2018   Chronic headaches 12/13/2018   Weakness 09/06/2017   Depression 09/06/2017   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   Generalized anxiety disorder with panic attacks 09/06/2017   Right hand pain    Wrist swelling    Cellulitis of right upper extremity 04/11/2016   Elbow pain 07/20/2015   Anxiety 07/20/2015   Tachycardia 07/04/2015   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[place])    Skin ulcer of upper arm, limited to breakdown of skin ([place]) 07/01/2015   Past Surgical History:  Procedure Laterality Date   APPENDECTOMY  ~ [id number]   APPLICATION OF A-CELL OF EXTREMITY Left 08/05/2015   Procedure: APPLICATION OF A-CELL OF EXTREMITY;  Surgeon: Wallace Going, DO;  Location: [place];  Service: Plastics;  Laterality: Left;   BREAST SURGERY Right 1990   "milk duct taken out"   CHONDROPLASTY Right 02/19/2019   Procedure: CHONDROPLASTY; EXCISION EXOSTOSIS;  Surgeon: Garald Balding, MD;  Location: [place];  Service: Orthopedics;  Laterality: Right;   DEBRIDEMENT AND CLOSURE WOUND Left 07/01/2015   Procedure: LEFT ELBOW EXCISION OF WOUND WITH PRIMARY CLOSURE 2X5 CM ;  Surgeon: Wallace Going, DO;  Location: [place];  Service: Plastics;  Laterality: Left;   ELBOW SURGERY Left X 23 in [place] <06/2015   from a cat bite; all I&D   I & D EXTREMITY Left 07/08/2015   Procedure: IRRIGATION AND DEBRIDEMENT EXTREMITY, DRAINAGE OF LEFT ARM WOUND, A-CELL PLACEMENT, WOUND VAC PLACEMENT;  Surgeon: Loel Lofty Dillingham, DO;  Location: [facility];  Service: Plastics;  Laterality: Left;   INCISION AND DRAINAGE OF WOUND Left 08/05/2015   Procedure: IRRIGATION AND DEBRIDEMENT LEFT ELBOW WOUND, PLACEMENT OF ACELL;   Surgeon: Wallace Going, DO;  Location: [place];  Service: Plastics;  Laterality: Left;   KNEE ARTHROSCOPY WITH MEDIAL MENISECTOMY Right 02/19/2019   Procedure: RIGHT KNEE ARTHROSCOPY, DEBRIDEMENT, PARTIAL MEDIAL AND LATERAL MENISECTOMY;  Surgeon: Garald Balding, MD;  Location: [place];  Service: Orthopedics;  Laterality: Right;   [place]   SKIN GRAFT Left 2016   took from anterior thigh; placed at elbow   TONSILLECTOMY  ~ [place]  2003   WRIST SURGERY Right 01/2016   OB History   No obstetric history on file.    Home Medications    Prior to Admission medications   Medication Sig Start Date End Date Taking? Authorizing Provider  albuterol (VENTOLIN HFA) 108 (90 Base) MCG/ACT inhaler Inhale into the lungs every 6 (six) hours as needed.    [provider]  atorvastatin (LIPITOR) 20 MG tablet TAKE 1 TABLET BY MOUTH ONCE A DAY 03/31/21 03/31/22  Isaac Bliss, Rayford Halsted, MD  diazepam (VALIUM) 10 MG tablet Take 1 tablet (10 mg total) by mouth 3 (three) times daily. 08/25/21 02/21/22  Plovsky, Berneta Sages, MD  gabapentin (NEURONTIN) 300 MG capsule Take 2 capsules (600 mg total) by mouth 3 (three) times daily. 08/10/21   Isaac Bliss, Rayford Halsted, MD  venlafaxine XR (EFFEXOR-XR) 150 MG 24 hr capsule Take 1 capsule (150 mg total) by mouth every morning. 08/25/21 08/25/22  Norma Fredrickson, MD   Family History Family History  Problem Relation Age of Onset   Liver disease Mother    Dementia Mother    Cirrhosis Mother    Prostate cancer Father    Colon cancer Maternal Grandmother    Ovarian cancer Maternal Aunt    Social History Social History   Tobacco Use   Smoking status: Never   Smokeless tobacco: Never  Vaping Use   Vaping Use: Never used  Substance Use Topics   Alcohol use: Not Currently    Alcohol/week: 0.0 standard drinks   Drug use: No   Allergies   Ondansetron, Zofran  [ondansetron hcl], Bromfed dm [pseudoeph-bromphen-dm], Diclofenac sodium, Droperidol, Flexeril [cyclobenzaprine], Morphine, Morphine and related, and Tessalon [benzonatate]  Review of Systems Review of Systems Pertinent findings noted in history of present illness.   Physical Exam Triage Vital Signs ED Triage Vitals  Enc Vitals Group     BP 07/09/21 0827 (!) 147/82     Pulse Rate 07/09/21 0827 72     Resp 07/09/21 0827 18     Temp 07/09/21 0827 98.3 F (36.8 C)     Temp Source 07/09/21 0827 Oral     SpO2 07/09/21 0827 98 %     Weight --      Height --      Head Circumference --      Peak Flow --      Pain Score 07/09/21 0826 5  Pain Loc --      Pain Edu? --      Excl. in [place]? --   No data found.  Updated Vital Signs BP 101/66 (BP Location: Right Arm)    Pulse (!) 116    Temp 98.4 F (36.9 C) (Oral)    Resp 16    SpO2 96%   Physical Exam Vitals and nursing note reviewed.  Constitutional:      General: She is not in acute distress.    Appearance: Normal appearance. She is not ill-appearing.  HENT:     Head: Normocephalic and atraumatic.     Salivary Glands: Right salivary gland is not diffusely enlarged or tender. Left salivary gland is not diffusely enlarged or tender.     Right Ear: Tympanic membrane, ear canal and external ear normal. No drainage. No middle ear effusion. There is no impacted cerumen. Tympanic membrane is not erythematous or bulging.     Left Ear: Tympanic membrane, ear canal and external ear normal. No drainage.  No middle ear effusion. There is no impacted cerumen. Tympanic membrane is not erythematous or bulging.     Nose: Nose normal. No nasal deformity, septal deviation, mucosal edema, congestion or rhinorrhea.     Right Turbinates: Not enlarged, swollen or pale.     Left Turbinates: Not enlarged, swollen or pale.     Right Sinus: No maxillary sinus tenderness or frontal sinus tenderness.     Left Sinus: No maxillary sinus tenderness or frontal  sinus tenderness.     Mouth/Throat:     Lips: Pink. No lesions.     Mouth: Mucous membranes are moist. No oral lesions.     Pharynx: Oropharynx is clear. Uvula midline. No posterior oropharyngeal erythema or uvula swelling.     Tonsils: No tonsillar exudate. 0 on the right. 0 on the left.  Eyes:     General: Lids are normal.        Right eye: No discharge.        Left eye: No discharge.     Extraocular Movements: Extraocular movements intact.     Conjunctiva/sclera: Conjunctivae normal.     Right eye: Right conjunctiva is not injected.     Left eye: Left conjunctiva is not injected.  Neck:     Trachea: Trachea and phonation normal.  Cardiovascular:     Rate and Rhythm: Normal rate and regular rhythm.     Pulses: Normal pulses.     Heart sounds: Normal heart sounds. No murmur heard.   No friction rub. No gallop.  Pulmonary:     Effort: Pulmonary effort is normal. No accessory muscle usage, prolonged expiration or respiratory distress.     Breath sounds: Normal breath sounds and air entry. No stridor, decreased air movement or transmitted upper airway sounds. No decreased breath sounds, wheezing, rhonchi or rales.     Comments: Bronchospasm with cough Chest:     Chest wall: No tenderness.  Musculoskeletal:        General: Normal range of motion.     Cervical back: Normal range of motion and neck supple. Normal range of motion.  Lymphadenopathy:     Cervical: No cervical adenopathy.  Skin:    General: Skin is warm and dry.     Findings: No erythema or rash.  Neurological:     General: No focal deficit present.     Mental Status: She is alert and oriented to person, place, and time.  Psychiatric:  Mood and Affect: Mood normal.        Behavior: Behavior normal.    Visual Acuity Right Eye Distance:   Left Eye Distance:   Bilateral Distance:    Right Eye Near:   Left Eye Near:    Bilateral Near:     [facility] Couse / Diagnostics / Procedures:    EKG  Radiology No  results found.  Procedures Procedures (including critical care time)  UC Diagnoses / Final Clinical Impressions(s)   I have reviewed the triage vital signs and the nursing notes.  Pertinent labs & imaging results that were available during my care of the Sabrina [name] were reviewed by me and considered in my medical decision making (see chart for details).   Final diagnoses:  Bronchitis with bronchospasm  Chronic migraine w/o aura w/o status migrainosus, not intractable   Sabrina [name] denies current migraine symptoms, have advised her to try Imitrex for ibuprofen and a spoonful of peanut butter while she awaits her next visit with her primary care provider.  Sabrina [name] was also provided with a prescription for Breo to use 1 puff daily and to continue using albuterol.  Sabrina [name] politely declined repeat steroid therapy even though her last steroid treatment was only a 20 mg dose, Sabrina [name] states he is having surgery in 2 weeks and wants to avoid any immune suppressing medications.  ED Prescriptions     Medication Sig Dispense Auth. Provider   fluticasone furoate-vilanterol (BREO ELLIPTA) 200-25 MCG/ACT AEPB Inhale 1 puff into the lungs daily. 60 each Lynden Oxford Scales, PA-C   albuterol (VENTOLIN HFA) 108 (90 Base) MCG/ACT inhaler Inhale 2 puffs into the lungs every 6 (six) hours as needed for wheezing or shortness of breath (Cough). 18 g Lynden Oxford Scales, PA-C   SUMAtriptan (IMITREX) 50 MG tablet Take 1 tablet at onset of headache with 400 mg of ibuprofen and a spoonful of peanut butter.  May take second sumatriptan tablet in 2 hours if headache persists. 10 tablet Lynden Oxford Scales, PA-C   ibuprofen (ADVIL) 400 MG tablet Take 1 tablet (400 mg total) by mouth every 8 (eight) hours as needed for up to 30 doses for mild pain (Respiratory inflammation). 30 tablet Lynden Oxford Scales, PA-C   promethazine-dextromethorphan (PROMETHAZINE-DM) 6.25-15 MG/5ML syrup Take 5 mLs by mouth 4 (four) times  daily as needed for cough. 180 mL Lynden Oxford Scales, PA-C      PDMP not reviewed this encounter.  Pending results:  Labs Reviewed - No data to display  Medications Ordered in UC: Medications - No data to display  Disposition Upon Discharge:  Condition: stable for discharge home Home: take medications as prescribed; routine discharge instructions as discussed; follow up as advised.  Sabrina [name] presented with an acute illness with associated systemic symptoms and significant discomfort requiring urgent management. In my opinion, this is a condition that a prudent lay person (someone who possesses an average knowledge of health and medicine) may potentially expect to result in complications if not addressed urgently such as respiratory distress, impairment of bodily function or dysfunction of bodily organs.   Routine symptom specific, illness specific and/or disease specific instructions were discussed with the Sabrina [name] and/or caregiver at length.   As such, the Sabrina [name] has been evaluated and assessed, work-up was performed and treatment was provided in alignment with urgent care protocols and evidence based medicine.  Sabrina [name]/parent/caregiver has been advised that the Sabrina [name] may require follow up for further testing and treatment if the symptoms continue in spite of treatment, as  clinically indicated and appropriate.  If the Sabrina [name] was tested for COVID-19, Influenza and/or RSV, then the Sabrina [name]/parent/guardian was advised to isolate at home pending the results of his/her diagnostic coronavirus test and potentially longer if theyre positive. I have also advised pt that if his/her COVID-19 test returns positive, it's recommended to self-isolate for at least 10 days after symptoms first appeared AND until fever-free for 24 hours without fever reducer AND other symptoms have improved or resolved. Discussed self-isolation recommendations as well as instructions for household member/close contacts  as per the [facility] and [place] DHHS, and also gave Sabrina [name] the [place] packet with this information.  Sabrina [name]/parent/caregiver has been advised to return to the [facility] or PCP in 3-5 days if no better; to PCP or the Emergency Department if new signs and symptoms develop, or if the current signs or symptoms continue to change or worsen for further workup, evaluation and treatment as clinically indicated and appropriate  The Sabrina [name] will follow up with their current PCP if and as advised. If the Sabrina [name] does not currently have a PCP we will assist them in obtaining one.   The Sabrina [name] may need specialty follow up if the symptoms continue, in spite of conservative treatment and management, for further workup, evaluation, consultation and treatment as clinically indicated and appropriate.  Sabrina [name]/parent/caregiver verbalized understanding and agreement of plan as discussed.  All questions were addressed during visit.  Please see discharge instructions below for further details of plan.  Discharge Instructions:   Discharge Instructions      Please discuss migraine prevention therapy with your provider.  There are some wonderful injectable medications that can completely relieve your recurrent migraines.  For your cough, please begin Breo, 1 puff daily.  For the first few days, you may choose to inhale this twice daily for best benefit.  Please be sure to wash your mouth out after every use to remove any residual steroid from the oral cavity.  You can continue to use albuterol as often as you need for cough and shortness of breath.  Please also continue to use Flonase daily.  I also consider purchasing Sudafed from the pharmacy to take for the next several days to dry mucous membranes.  Please begin ibuprofen 400 mg 3 times daily for the next 3 to 4 days, this reduces inflammation in the lungs and can also decrease your cough.  I provided you with a prescription for Promethazine DM for nighttime  cough.      This office note has been dictated using Museum/gallery curator.  Unfortunately, and despite my best efforts, this method of dictation can sometimes lead to occasional typographical or grammatical errors.  I apologize in advance if this occurs.     Lynden Oxford Scales, PA-C 09/24/21 1238

## 2021-09-24 NOTE — ED Triage Notes (Signed)
Patient presents to Urgent Care with complaints of a cough x few weeks. Pt states she had a virtual visit and was prescribed cough med/steroids. Pt states she tested positive for covid 2 weeks ago. Pt states she has also been using a humidifier. Last known fever 4 days ago.   Denies fever or SOB.

## 2021-10-12 ENCOUNTER — Other Ambulatory Visit: Payer: Self-pay | Admitting: Neurology

## 2021-10-12 DIAGNOSIS — G8929 Other chronic pain: Secondary | ICD-10-CM

## 2021-10-16 ENCOUNTER — Telehealth: Payer: BLUE CROSS/BLUE SHIELD | Admitting: Nurse Practitioner

## 2021-10-16 DIAGNOSIS — R053 Chronic cough: Secondary | ICD-10-CM

## 2021-10-16 DIAGNOSIS — U099 Post covid-19 condition, unspecified: Secondary | ICD-10-CM

## 2021-10-16 MED ORDER — PROMETHAZINE-DM 6.25-15 MG/5ML PO SYRP: 5.0000 mL | ORAL_SOLUTION | Freq: Four times a day (QID) | ORAL | 0 refills | Status: DC | PRN

## 2021-10-16 NOTE — Patient Instructions (Signed)

## 2021-10-16 NOTE — Progress Notes (Signed)
Virtual Visit Consent   Sabrina JabsDana L Kincaid, you are scheduled for a virtual visit with Mary-Margaret Daphine DeutscherMartin, FNP, a [facility] provider, today.     Just as with appointments in the office, your consent must be obtained to participate.  Your consent will be active for this visit and any virtual visit you may have with one of our providers in the next 365 days.     If you have a MyChart account, a copy of this consent can be sent to you electronically.  All virtual visits are billed to your insurance company just like a traditional visit in the office.    As this is a virtual visit, video technology does not allow for your provider to perform a traditional examination.  This may limit your provider's ability to fully assess your condition.  If your provider identifies any concerns that need to be evaluated in person or the need to arrange testing (such as labs, EKG, etc.), we will make arrangements to do so.     Although advances in technology are sophisticated, we cannot ensure that it will always work on either your end or our end.  If the connection with a video visit is poor, the visit may have to be switched to a telephone visit.  With either a video or telephone visit, we are not always able to ensure that we have a secure connection.     I need to obtain your verbal consent now.   Are you willing to proceed with your visit today? YES   Sabrina JabsDana L Nebel has provided verbal consent on 10/16/2021 for a virtual visit (video or telephone).   Mary-Margaret Daphine DeutscherMartin, FNP   Date: 10/16/2021 2:26 PM   Virtual Visit via Video Note   I, Mary-Margaret Daphine DeutscherMartin, connected with Sabrina JabsDana L Macon ([id number], 06-17-1966) on 10/16/21 at  2:45 PM EST by a video-enabled telemedicine application and verified that I am speaking with the correct person using two identifiers.  Location: Patient: Virtual Visit Location Patient: Home Provider: Virtual Visit Location Provider: Mobile   I discussed the limitations of  evaluation and management by telemedicine and the availability of in person appointments. The patient expressed understanding and agreed to proceed.    History of Present Illness: Sabrina [name] is a 56 y.o. who identifies as a female who was assigned female at birth, and is being seen today for cough.  HPI: Cough This is a new problem. The current episode started 1 to 4 weeks ago. The problem has been waxing and waning (she has had covid twice and the last time she had it was about 1 month ago.). The problem occurs every few hours (worse at night). The cough is Productive of sputum. Associated symptoms include ear congestion, nasal congestion, a sore throat and wheezing. The symptoms are aggravated by lying down. She has tried OTC cough suppressant and OTC inhaler for the symptoms. The treatment provided mild relief. Her past medical history is significant for bronchitis.  Patient said she had surgery 2 weeks ago. Cough started back right after surgery. Review of Systems  HENT:  Positive for sore throat.   Respiratory:  Positive for cough and wheezing.    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   Other meniscus derangements, posterior horn of medial meniscus, left knee 02/19/2019   Bucket handle tear of lateral meniscus 02/19/2019   Unilateral primary osteoarthritis, right knee 12/13/2018   Panic disorder 12/13/2018   Chronic headaches 12/13/2018   Weakness 09/06/2017   Depression 09/06/2017   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   Generalized anxiety disorder with panic attacks 09/06/2017   Right hand pain    Wrist swelling    Cellulitis of right upper extremity 04/11/2016    Elbow pain 07/20/2015   Anxiety 07/20/2015   Tachycardia 07/04/2015   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HCC)    Skin ulcer of upper arm, limited to breakdown of skin ([facility]CC) 07/01/2015    Allergies:  Allergies  Allergen Reactions   Ondansetron Rash and Swelling    Spots around iv site IV zofran   Zofran [Ondansetron Hcl] Hives and Other (See Comments)    Spots around iv site   Bromfed Dm [Pseudoeph-Bromphen-Dm] Other (See Comments)    anxiety   Diclofenac Sodium Rash   Droperidol Palpitations   Flexeril [Cyclobenzaprine] Anxiety   Morphine Itching   Morphine And Related Itching   Tessalon [Benzonatate] Other (See Comments)    Conjunctivitis, watery eyes.   Medications:  Current Outpatient Medications:    albuterol (VENTOLIN HFA) 108 (90 Base) MCG/ACT inhaler, Inhale 2 puffs into the lungs every 6 (six) hours as needed for wheezing or shortness of breath (Cough)., Disp: 18 g, Rfl: 1   atorvastatin (LIPITOR) 20 MG tablet, TAKE 1 TABLET BY MOUTH ONCE A DAY, Disp: 90 tablet, Rfl: 1   butalbital-acetaminophen-caffeine (FIORICET) 50-325-40 MG tablet, TAKE ONE TABLET BY MOUTH EVERY 6 HOURS AS NEEDED FOR HEADACHE, Disp: 10 tablet, Rfl: 5   diazepam (VALIUM) 10 MG tablet, Take 1 tablet (10 mg total) by mouth 3 (three) times daily., Disp: 90 tablet, Rfl: 4   fluticasone furoate-vilanterol (BREO ELLIPTA) 200-25 MCG/ACT AEPB, Inhale 1 puff into the lungs daily., Disp: 60 each, Rfl: 1   gabapentin (NEURONTIN) 300 MG capsule, Take 2 capsules (600 mg total) by mouth 3 (three) times daily., Disp: 540 capsule, Rfl: 1   ibuprofen (ADVIL) 400 MG tablet, Take 1 tablet (400 mg total) by mouth every 8 (eight) hours as needed for up to 30 doses for mild pain (Respiratory inflammation)., Disp: 30 tablet, Rfl: 0   promethazine-dextromethorphan (PROMETHAZINE-DM) 6.25-15 MG/5ML syrup, Take 5 mLs by mouth 4 (four) times daily as needed for cough., Disp: 180 mL, Rfl: 0   SUMAtriptan (IMITREX) 50 MG tablet, Take 1 tablet at  onset of headache with 400 mg of ibuprofen and a spoonful of peanut butter.  May take second sumatriptan tablet in 2 hours if headache persists., Disp: 10 tablet, Rfl: 0   venlafaxine XR (EFFEXOR-XR) 150 MG 24 hr capsule, Take 1 capsule (150 mg total) by mouth every morning., Disp: 90 capsule, Rfl: 1  Observations/Objective: Patient is well-developed, well-nourished in no acute distress.  Resting comfortably  at home.  Head is normocephalic, atraumatic.  No labored breathing.  Speech is clear and coherent with logical content.  Patient is alert and oriented at baseline.  Dry cough  Assessment and Plan:  JENNIEFER SALAK in today with chief complaint of Cough   1. Post-COVID chronic cough Force fluids Rest Humidifier This cough can last several weeks. Do not want to do steroids due to just having surgery- and that could delay healing Continue BREO and albuterol Meds ordered  this encounter  Medications   promethazine-dextromethorphan (PROMETHAZINE-DM) 6.25-15 MG/5ML syrup    Sig: Take 5 mLs by mouth 4 (four) times daily as needed for cough.    Dispense:  180 mL    Refill:  0    Order Specific Question:   Supervising Provider    Answer:   Eber Hong [[id number]]       Follow Up Instructions: I discussed the assessment and treatment plan with the patient. The patient was provided an opportunity to ask questions and all were answered. The patient agreed with the plan and demonstrated an understanding of the instructions.  A copy of instructions were sent to the patient via MyChart.  The patient was advised to call back or seek an in-person evaluation if the symptoms worsen or if the condition fails to improve as anticipated.  Time:  I spent 13 minutes with the patient via telehealth technology discussing the above problems/concerns.    Mary-Margaret Daphine Deutscher, FNP

## 2021-10-24 ENCOUNTER — Telehealth: Payer: BLUE CROSS/BLUE SHIELD | Admitting: Nurse Practitioner

## 2021-10-24 DIAGNOSIS — U099 Post covid-19 condition, unspecified: Secondary | ICD-10-CM | POA: Diagnosis not present

## 2021-10-24 DIAGNOSIS — R053 Chronic cough: Secondary | ICD-10-CM | POA: Diagnosis not present

## 2021-10-24 DIAGNOSIS — U071 COVID-19: Secondary | ICD-10-CM

## 2021-10-24 MED ORDER — GUAIFENESIN ER 600 MG PO TB12: 600.0000 mg | ORAL_TABLET | Freq: Two times a day (BID) | ORAL | 0 refills | Status: AC

## 2021-10-24 MED ORDER — PSEUDOEPH-BROMPHEN-DM 30-2-10 MG/5ML PO SYRP: 5.0000 mL | ORAL_SOLUTION | Freq: Four times a day (QID) | ORAL | 0 refills | Status: DC | PRN

## 2021-10-24 NOTE — Progress Notes (Signed)
Virtual Visit Consent   Sabrina [name], you are scheduled for a virtual visit with a [facility] provider today.     Just as with appointments in the office, your consent must be obtained to participate.  Your consent will be active for this visit and any virtual visit you may have with one of our providers in the next 365 days.     If you have a MyChart account, a copy of this consent can be sent to you electronically.  All virtual visits are billed to your insurance company just like a traditional visit in the office.    As this is a virtual visit, video technology does not allow for your provider to perform a traditional examination.  This may limit your provider's ability to fully assess your condition.  If your provider identifies any concerns that need to be evaluated in person or the need to arrange testing (such as labs, EKG, etc.), we will make arrangements to do so.     Although advances in technology are sophisticated, we cannot ensure that it will always work on either your end or our end.  If the connection with a video visit is poor, the visit may have to be switched to a telephone visit.  With either a video or telephone visit, we are not always able to ensure that we have a secure connection.     I need to obtain your verbal consent now.   Are you willing to proceed with your visit today? Yes   Sabrina [name] has provided verbal consent on 10/24/2021 for a virtual visit (video or telephone).   Abran Cantor, NP   Date: 10/24/2021 11:22 AM   Virtual Visit via Video Note   I, Sabrina [name], connected with  Sabrina [name]  ([id number], 30-Jun-1966) on 10/24/21 at 11:30 AM EST by a video-enabled telemedicine application and verified that I am speaking with the correct person using two identifiers.  Location: Patient: Virtual Visit Location Patient: Home Provider: Virtual Visit Location Provider: Home   I discussed the limitations of evaluation and management by  telemedicine and the availability of in person appointments. The patient expressed understanding and agreed to proceed.    History of Present Illness: Sabrina [name] is a 56 y.o. who identifies as a female who was assigned female at birth, and is being seen today for cough.  HPI: Reports she took a COVID test today, which was positive. She reports the cough started in early January. She reports the cough worsened over the past 48 hours. She denies history of smoking. She informs she had COVID 4 times.  Cough The current episode started 1 to 4 weeks ago. The problem has been gradually worsening. The problem occurs hourly. The cough is Non-productive. Associated symptoms include chills, a fever (102.7), nasal congestion and a sore throat. Pertinent negatives include no chest pain, ear congestion, ear pain, shortness of breath or wheezing. Nothing aggravates the symptoms. Treatments tried: was prescribed promethazine DM but she is allergic to medication- itching, currently on doxycycline. Her past medical history is significant for bronchitis and pneumonia. There is no history of environmental allergies.    Problems:  Patient Active Problem List   Diagnosis Date Noted   Primary osteoarthritis of left knee 06/16/2020   Effusion, left knee 06/16/2020   Loose body in knee, left knee 06/16/2020   Vitamin D deficiency 03/24/2020   Hyperlipidemia 03/24/2020   Pain in left foot 01/21/2020   Primary  osteoarthritis of right knee 08/06/2019   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   Other meniscus derangements, posterior horn of medial meniscus, left knee 02/19/2019   Bucket handle tear of lateral meniscus 02/19/2019   Unilateral primary osteoarthritis, right knee 12/13/2018   Panic disorder 12/13/2018   Chronic headaches 12/13/2018   Weakness 09/06/2017   Depression 09/06/2017   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   Generalized anxiety disorder with panic attacks 09/06/2017   Right hand pain    Wrist swelling     Cellulitis of right upper extremity 04/11/2016   Elbow pain 07/20/2015   Anxiety 07/20/2015   Tachycardia 07/04/2015   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HCC)    Skin ulcer of upper arm, limited to breakdown of skin (HCC) 07/01/2015    Allergies:  Allergies  Allergen Reactions   Ondansetron Rash and Swelling    Spots around iv site IV zofran   Zofran [Ondansetron Hcl] Hives and Other (See Comments)    Spots around iv site   Bromfed Dm [Pseudoeph-Bromphen-Dm] Other (See Comments)    anxiety   Diclofenac Sodium Rash   Droperidol Palpitations   Flexeril [Cyclobenzaprine] Anxiety   Morphine Itching   Morphine And Related Itching   Tessalon [Benzonatate] Other (See Comments)    Conjunctivitis, watery eyes.   Medications:  Current Outpatient Medications:    albuterol (VENTOLIN HFA) 108 (90 Base) MCG/ACT inhaler, Inhale 2 puffs into the lungs every 6 (six) hours as needed for wheezing or shortness of breath (Cough)., Disp: 18 g, Rfl: 1   atorvastatin (LIPITOR) 20 MG tablet, TAKE 1 TABLET BY MOUTH ONCE A DAY, Disp: 90 tablet, Rfl: 1   butalbital-acetaminophen-caffeine (FIORICET) 50-325-40 MG tablet, TAKE ONE TABLET BY MOUTH EVERY 6 HOURS AS NEEDED FOR HEADACHE, Disp: 10 tablet, Rfl: 5   diazepam (VALIUM) 10 MG tablet, Take 1 tablet (10 mg total) by mouth 3 (three) times daily., Disp: 90 tablet, Rfl: 4   fluticasone furoate-vilanterol (BREO ELLIPTA) 200-25 MCG/ACT AEPB, Inhale 1 puff into the lungs daily., Disp: 60 each, Rfl: 1   gabapentin (NEURONTIN) 300 MG capsule, Take 2 capsules (600 mg total) by mouth 3 (three) times daily., Disp: 540 capsule, Rfl: 1   ibuprofen (ADVIL) 400 MG tablet, Take 1 tablet (400 mg total) by mouth every 8 (eight) hours as needed for up to 30 doses for mild pain (Respiratory inflammation)., Disp: 30 tablet, Rfl: 0   promethazine-dextromethorphan (PROMETHAZINE-DM) 6.25-15 MG/5ML syrup, Take 5 mLs by mouth 4 (four) times daily as needed for cough., Disp: 180 mL, Rfl: 0    SUMAtriptan (IMITREX) 50 MG tablet, Take 1 tablet at onset of headache with 400 mg of ibuprofen and a spoonful of peanut butter.  May take second sumatriptan tablet in 2 hours if headache persists., Disp: 10 tablet, Rfl: 0   venlafaxine XR (EFFEXOR-XR) 150 MG 24 hr capsule, Take 1 capsule (150 mg total) by mouth every morning., Disp: 90 capsule, Rfl: 1  Observations/Objective: Patient is well-developed, well-nourished in no acute distress.  Resting comfortably at home.  Head is normocephalic, atraumatic.  No labored breathing.  Speech is clear and coherent with logical content.  Patient is alert and oriented at baseline.    Assessment and Plan: 1. Post-COVID chronic cough - guaiFENesin (MUCINEX) 600 MG 12 hr tablet; Take 1 tablet (600 mg total) by mouth 2 (two) times daily for 7 days.  Dispense: 14 tablet; Refill: 0   Lengthy discussion with patient regarding current symptoms. I recommend the patient have a face to face visit  with her PCP and that she needs a chest x-ray given the duration of her symptoms. Discussed the effects of post COVID symptoms with the patient. She reports she has taken cough medications with DM without reaction, reports allergy to codeine only. She has a prescription for Paxlovid that she was previously prescribed but did not take medication, recommend she start medication. Encouraged patient to continue use of Albuterol. Increase fluids, get plenty rest. Follow-up with PCP this week.   Follow Up Instructions: I discussed the assessment and treatment plan with the patient. The patient was provided an opportunity to ask questions and all were answered. The patient agreed with the plan and demonstrated an understanding of the instructions.  A copy of instructions were sent to the patient via MyChart unless otherwise noted below.     The patient was advised to call back or seek an in-person evaluation if the symptoms worsen or if the condition fails to improve as  anticipated.  Time:  I spent 15 minutes with the patient via telehealth technology discussing the above problems/concerns.    Abran Cantor, NP

## 2021-10-24 NOTE — Patient Instructions (Addendum)
Sabrina [name], thank you for joining Abran Cantor, NP for today's virtual visit.  While this provider is not your primary care provider (PCP), if your PCP is located in our provider database this encounter information will be shared with them immediately following your visit.  Consent: (Patient) Sabrina [name] provided verbal consent for this virtual visit at the beginning of the encounter.  Current Medications:  Current Outpatient Medications:    brompheniramine-pseudoephedrine-DM 30-2-10 MG/5ML syrup, Take 5 mLs by mouth 4 (four) times daily as needed for up to 7 days., Disp: 140 mL, Rfl: 0   guaiFENesin (MUCINEX) 600 MG 12 hr tablet, Take 1 tablet (600 mg total) by mouth 2 (two) times daily for 7 days., Disp: 14 tablet, Rfl: 0   albuterol (VENTOLIN HFA) 108 (90 Base) MCG/ACT inhaler, Inhale 2 puffs into the lungs every 6 (six) hours as needed for wheezing or shortness of breath (Cough)., Disp: 18 g, Rfl: 1   atorvastatin (LIPITOR) 20 MG tablet, TAKE 1 TABLET BY MOUTH ONCE A DAY, Disp: 90 tablet, Rfl: 1   butalbital-acetaminophen-caffeine (FIORICET) 50-325-40 MG tablet, TAKE ONE TABLET BY MOUTH EVERY 6 HOURS AS NEEDED FOR HEADACHE, Disp: 10 tablet, Rfl: 5   diazepam (VALIUM) 10 MG tablet, Take 1 tablet (10 mg total) by mouth 3 (three) times daily., Disp: 90 tablet, Rfl: 4   fluticasone furoate-vilanterol (BREO ELLIPTA) 200-25 MCG/ACT AEPB, Inhale 1 puff into the lungs daily., Disp: 60 each, Rfl: 1   gabapentin (NEURONTIN) 300 MG capsule, Take 2 capsules (600 mg total) by mouth 3 (three) times daily., Disp: 540 capsule, Rfl: 1   ibuprofen (ADVIL) 400 MG tablet, Take 1 tablet (400 mg total) by mouth every 8 (eight) hours as needed for up to 30 doses for mild pain (Respiratory inflammation)., Disp: 30 tablet, Rfl: 0   promethazine-dextromethorphan (PROMETHAZINE-DM) 6.25-15 MG/5ML syrup, Take 5 mLs by mouth 4 (four) times daily as needed for cough., Disp: 180 mL, Rfl: 0   SUMAtriptan  (IMITREX) 50 MG tablet, Take 1 tablet at onset of headache with 400 mg of ibuprofen and a spoonful of peanut butter.  May take second sumatriptan tablet in 2 hours if headache persists., Disp: 10 tablet, Rfl: 0   venlafaxine XR (EFFEXOR-XR) 150 MG 24 hr capsule, Take 1 capsule (150 mg total) by mouth every morning., Disp: 90 capsule, Rfl: 1   Medications ordered in this encounter:  Meds ordered this encounter  Medications   guaiFENesin (MUCINEX) 600 MG 12 hr tablet    Sig: Take 1 tablet (600 mg total) by mouth 2 (two) times daily for 7 days.    Dispense:  14 tablet    Refill:  0   brompheniramine-pseudoephedrine-DM 30-2-10 MG/5ML syrup    Sig: Take 5 mLs by mouth 4 (four) times daily as needed for up to 7 days.    Dispense:  140 mL    Refill:  0    Patient has an allergy to codeine. She informs she has taken medication with DM and she does not have any reaction.     *If you need refills on other medications prior to your next appointment, please contact your pharmacy*  Follow-Up: Call back or seek an in-person evaluation if the symptoms worsen or if the condition fails to improve as anticipated.  Other Instructions I recommend the patient have a face to face visit with her PCP and that she needs a chest x-ray given the duration of her symptoms. Discussed the effects of post  COVID symptoms with the patient. She reports she has taken cough medications with DM without reaction, reports allergy to codeine only. She has a prescription for Paxlovid that she was previously prescribed but did not take medication, recommend she start medication. Encouraged patient to continue use of Albuterol. Increase fluids, get plenty rest. Follow-up with PCP this week.    If you have been instructed to have an in-person evaluation today at a local Urgent Care facility, please use the link below. It will take you to a list of all of our available [facility], including address, phone number and hours  of operation. Please do not delay care.  [facility]  If you or a family member do not have a primary care provider, use the link below to schedule a visit and establish care. When you choose a [facility] primary care physician or advanced practice provider, you gain a long-term partner in health. Find a Primary Care Provider  Learn more about [facility]'s in-office and virtual care options: [facility] Get Care Now

## 2021-10-27 ENCOUNTER — Telehealth: Payer: BLUE CROSS/BLUE SHIELD | Admitting: Physician Assistant

## 2021-10-27 DIAGNOSIS — R052 Subacute cough: Secondary | ICD-10-CM

## 2021-10-27 MED ORDER — PROMETHAZINE-DM 6.25-15 MG/5ML PO SYRP: 5.0000 mL | ORAL_SOLUTION | Freq: Four times a day (QID) | ORAL | 0 refills | Status: DC | PRN

## 2021-10-27 NOTE — Progress Notes (Signed)
Virtual Visit Consent   Sabrina [name], you are scheduled for a virtual visit with a [facility] provider today.     Just as with appointments in the office, your consent must be obtained to participate.  Your consent will be active for this visit and any virtual visit you may have with one of our providers in the next 365 days.     If you have a MyChart account, a copy of this consent can be sent to you electronically.  All virtual visits are billed to your insurance company just like a traditional visit in the office.    As this is a virtual visit, video technology does not allow for your provider to perform a traditional examination.  This may limit your provider's ability to fully assess your condition.  If your provider identifies any concerns that need to be evaluated in person or the need to arrange testing (such as labs, EKG, etc.), we will make arrangements to do so.     Although advances in technology are sophisticated, we cannot ensure that it will always work on either your end or our end.  If the connection with a video visit is poor, the visit may have to be switched to a telephone visit.  With either a video or telephone visit, we are not always able to ensure that we have a secure connection.     I need to obtain your verbal consent now.   Are you willing to proceed with your visit today?    [name] has provided verbal consent on 10/27/2021 for a virtual visit (video or telephone).   Sabrina [name], [place]   Date: 10/27/2021 4:46 PM   Virtual Visit via Video Note   I, Sabrina [name], connected with  SILVANA MATHERN  ([id number], [birth year]) on 10/27/21 at  4:45 PM EST by a video-enabled telemedicine application and verified that I am speaking with the correct person using two identifiers.  Location: Patient: Virtual Visit Location Patient: Home Provider: Virtual Visit Location Provider: Home Office   I discussed the limitations of evaluation and management by  telemedicine and the availability of in person appointments. The patient expressed understanding and agreed to proceed.    History of Present Illness: Sabrina [name] is a 56 y.o. who identifies as a female who was assigned female at birth, and is being seen today for continuing cough since COVID diagnosis a month ago. Has had COVID a total of 3 times since pandemic started. Notes protracted cough each time after infection has resolved. This time cough has persisted and been more significant. Was evaluated via video visit on 2/4 and given cough syrup which she notes helped. Had repeat visit 2/12 via video with another provider who gave her Bromfed and told her she had to follow-up with PCP. Saw new PCP yesterday who did exam, repeat flu/COVID tests (negative per review in [facility]) and negative CXR. States she was told what to try  OTC for cough. She notes that she has tried multiple OTC cough medication during and after COVID without success. The Bromfed she was recently given she cannot take due to jitters/anxiety it causes. Notes she can take promethazine-DM and tolerated well when she received a month ago. Notes she left messages with her new PCP but has not received a call back. Is worried because she is running low on sleep and has a lot of things with work that have to get done.   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   Other meniscus derangements, posterior horn of medial meniscus, left knee 02/19/2019   Bucket handle tear of lateral meniscus 02/19/2019   Unilateral primary osteoarthritis, right knee 12/13/2018   Panic disorder 12/13/2018   Chronic headaches  12/13/2018   Weakness 09/06/2017   Depression 09/06/2017   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   Generalized anxiety disorder with panic attacks 09/06/2017   Right hand pain    Wrist swelling    Cellulitis of right upper extremity 04/11/2016   Elbow pain 07/20/2015   Anxiety 07/20/2015   Tachycardia 07/04/2015   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[facility])    Skin ulcer of upper arm, limited to breakdown of skin ([facility]) 07/01/2015    Allergies:  Allergies  Allergen Reactions   Ondansetron Rash and Swelling    Spots around iv site IV zofran   Zofran [Ondansetron Hcl] Hives and Other (See Comments)    Spots around iv site   Bromfed Dm [Pseudoeph-Bromphen-Dm] Other (See Comments)    anxiety   Diclofenac Sodium Rash   Droperidol Palpitations   Flexeril [Cyclobenzaprine] Anxiety   Morphine Itching   Morphine And Related Itching   Tessalon [Benzonatate] Other (See Comments)    Conjunctivitis, watery eyes.   Medications:  Current Outpatient Medications:    albuterol (VENTOLIN HFA) 108 (90 Base) MCG/ACT inhaler, Inhale 2 puffs into the lungs every 6 (six) hours as needed for wheezing or shortness of breath (Cough)., Disp: 18 g, Rfl: 1   atorvastatin (LIPITOR) 20 MG tablet, TAKE 1 TABLET BY MOUTH ONCE A DAY, Disp: 90 tablet, Rfl: 1   butalbital-acetaminophen-caffeine (FIORICET) 50-325-40 MG tablet, TAKE ONE TABLET BY MOUTH EVERY 6 HOURS AS NEEDED FOR HEADACHE, Disp: 10 tablet, Rfl: 5   diazepam (VALIUM) 10 MG tablet, Take 1 tablet (10 mg total) by mouth 3 (three) times daily., Disp: 90 tablet, Rfl: 4   fluticasone furoate-vilanterol (BREO ELLIPTA) 200-25 MCG/ACT AEPB, Inhale 1 puff into the lungs daily., Disp: 60 each, Rfl: 1   gabapentin (NEURONTIN) 300 MG capsule, Take 2 capsules (600 mg total) by mouth 3 (three) times daily., Disp: 540 capsule, Rfl: 1   guaiFENesin (MUCINEX) 600 MG 12 hr tablet, Take 1 tablet (600 mg total) by mouth 2 (two) times daily for 7 days., Disp: 14 tablet, Rfl: 0   ibuprofen  (ADVIL) 400 MG tablet, Take 1 tablet (400 mg total) by mouth every 8 (eight) hours as needed for up to 30 doses for mild pain (Respiratory inflammation)., Disp: 30 tablet, Rfl: 0   promethazine-dextromethorphan (PROMETHAZINE-DM) 6.25-15 MG/5ML syrup, Take 5 mLs by mouth 4 (four) times daily as needed for cough., Disp: 180 mL, Rfl: 0   SUMAtriptan (IMITREX) 50 MG tablet, Take 1 tablet at onset of headache with 400 mg of ibuprofen and a spoonful of peanut butter.  May take second sumatriptan tablet in 2 hours if headache persists., Disp: 10 tablet, Rfl: 0   venlafaxine XR (EFFEXOR-XR) 150 MG 24 hr capsule, Take 1 capsule (150 mg total) by mouth every morning., Disp: 90 capsule, Rfl: 1  Observations/Objective: Patient is well-developed, well-nourished in no acute distress.  Resting comfortably at  home.  Head is normocephalic, atraumatic.  No labored breathing. Speech is clear and coherent with logical content.  Patient is alert and oriented at baseline.   Assessment and Plan: 1. Subacute cough - promethazine-dextromethorphan (PROMETHAZINE-DM) 6.25-15 MG/5ML syrup; Take 5 mLs by mouth 4 (four) times daily as needed for cough.  Dispense: 180 mL; Refill: 0  Post COVID. Negative repeat testing and CXR yesterday with new PCP. Will give her one-time fill of promethazine-DM. Discussed with her that we have done all we can for this issue via video visits and further evaluation/management/cough medicine will be at the discretion of her new PCP.   Follow Up Instructions: I discussed the assessment and treatment plan with the patient. The patient was provided an opportunity to ask questions and all were answered. The patient agreed with the plan and demonstrated an understanding of the instructions.  A copy of instructions were sent to the patient via MyChart unless otherwise noted below.   The patient was advised to call back or seek an in-person evaluation if the symptoms worsen or if the condition fails  to improve as anticipated.  Time:  I spent 10 minutes with the patient via telehealth technology discussing the above problems/concerns.    Sabrina Rio, PA-C

## 2021-10-27 NOTE — Patient Instructions (Signed)
Sabrina [name], thank you for joining [name], PA-C for today's virtual visit.  While this provider is not your primary care provider (PCP), if your PCP is located in our provider database this encounter information will be shared with them immediately following your visit.  Consent: (Patient) Sabrina [name] provided verbal consent for this virtual visit at the beginning of the encounter.  Current Medications:  Current Outpatient Medications:    albuterol (VENTOLIN HFA) 108 (90 Base) MCG/ACT inhaler, Inhale 2 puffs into the lungs every 6 (six) hours as needed for wheezing or shortness of breath (Cough)., Disp: 18 g, Rfl: 1   atorvastatin (LIPITOR) 20 MG tablet, TAKE 1 TABLET BY MOUTH ONCE A DAY, Disp: 90 tablet, Rfl: 1   butalbital-acetaminophen-caffeine (FIORICET) 50-325-40 MG tablet, TAKE ONE TABLET BY MOUTH EVERY 6 HOURS AS NEEDED FOR HEADACHE, Disp: 10 tablet, Rfl: 5   diazepam (VALIUM) 10 MG tablet, Take 1 tablet (10 mg total) by mouth 3 (three) times daily., Disp: 90 tablet, Rfl: 4   fluticasone furoate-vilanterol (BREO ELLIPTA) 200-25 MCG/ACT AEPB, Inhale 1 puff into the lungs daily., Disp: 60 each, Rfl: 1   gabapentin (NEURONTIN) 300 MG capsule, Take 2 capsules (600 mg total) by mouth 3 (three) times daily., Disp: 540 capsule, Rfl: 1   guaiFENesin (MUCINEX) 600 MG 12 hr tablet, Take 1 tablet (600 mg total) by mouth 2 (two) times daily for 7 days., Disp: 14 tablet, Rfl: 0   ibuprofen (ADVIL) 400 MG tablet, Take 1 tablet (400 mg total) by mouth every 8 (eight) hours as needed for up to 30 doses for mild pain (Respiratory inflammation)., Disp: 30 tablet, Rfl: 0   promethazine-dextromethorphan (PROMETHAZINE-DM) 6.25-15 MG/5ML syrup, Take 5 mLs by mouth 4 (four) times daily as needed for cough., Disp: 180 mL, Rfl: 0   SUMAtriptan (IMITREX) 50 MG tablet, Take 1 tablet at onset of headache with 400 mg of ibuprofen and a spoonful of peanut butter.  May take second sumatriptan tablet in 2  hours if headache persists., Disp: 10 tablet, Rfl: 0   venlafaxine XR (EFFEXOR-XR) 150 MG 24 hr capsule, Take 1 capsule (150 mg total) by mouth every morning., Disp: 90 capsule, Rfl: 1   Medications ordered in this encounter:  Meds ordered this encounter  Medications   promethazine-dextromethorphan (PROMETHAZINE-DM) 6.25-15 MG/5ML syrup    Sig: Take 5 mLs by mouth 4 (four) times daily as needed for cough.    Dispense:  180 mL    Refill:  0    Was given Bromfed a few days ago by another provider -- she is intolerant/allergic to this. Further fills of cough medications to come from her new PCP    Order Specific Question:   Supervising Provider    Answer:   Eber Hong [[id number]]     *If you need refills on other medications prior to your next appointment, please contact your pharmacy*  Follow-Up: Call back or seek an in-person evaluation if the symptoms worsen or if the condition fails to improve as anticipated.  Other Instructions Make sure to follow-up with your new PCP regarding ongoing evaluation and management for this cough. We are unable to provide any further assistance for this issue via virtual urgent care visit as has become more of a subacute/chronic issue that needs more in-depth follow-up.  If you have been instructed to have an in-person evaluation today at a local Urgent Care facility, please use the link below. It will take you to a list of  all of our available [facility], including address, phone number and hours of operation. Please do not delay care.  [facility]  If you or a family member do not have a primary care provider, use the link below to schedule a visit and establish care. When you choose a [facility] primary care physician or advanced practice provider, you gain a long-term partner in health. Find a Primary Care Provider  Learn more about 's in-office and virtual care options: [facility]

## 2021-11-15 ENCOUNTER — Telehealth: Payer: BLUE CROSS/BLUE SHIELD | Admitting: Physician Assistant

## 2021-11-15 DIAGNOSIS — R058 Other specified cough: Secondary | ICD-10-CM

## 2021-11-15 MED ORDER — PROMETHAZINE-DM 6.25-15 MG/5ML PO SYRP: 5.0000 mL | ORAL_SOLUTION | Freq: Four times a day (QID) | ORAL | 0 refills | Status: DC | PRN

## 2021-11-15 NOTE — Progress Notes (Signed)
Virtual Visit Consent   SENECA MOBBS, you are scheduled for a virtual visit with a [facility] provider today.     Just as with appointments in the office, your consent must be obtained to participate.  Your consent will be active for this visit and any virtual visit you may have with one of our providers in the next 365 days.     If you have a MyChart account, a copy of this consent can be sent to you electronically.  All virtual visits are billed to your insurance company just like a traditional visit in the office.    As this is a virtual visit, video technology does not allow for your provider to perform a traditional examination.  This may limit your provider's ability to fully assess your condition.  If your provider identifies any concerns that need to be evaluated in person or the need to arrange testing (such as labs, EKG, etc.), we will make arrangements to do so.     Although advances in technology are sophisticated, we cannot ensure that it will always work on either your end or our end.  If the connection with a video visit is poor, the visit may have to be switched to a telephone visit.  With either a video or telephone visit, we are not always able to ensure that we have a secure connection.     I need to obtain your verbal consent now.   Are you willing to proceed with your visit today?    ARITZA DEUTSCHMAN has provided verbal consent on 11/15/2021 for a virtual visit (video or telephone).   Mar Daring, PA-C   Date: 11/15/2021 6:20 PM   Virtual Visit via Video Note   I, Mar Daring, connected with  GABRIELLE GREENHILL  ([id number], 03/10/1966) on 11/15/21 at  5:45 PM EST by a video-enabled telemedicine application and verified that I am speaking with the correct person using two identifiers.  Location: Patient: Virtual Visit Location Patient: Home Provider: Virtual Visit Location Provider: Home Office   I discussed the limitations of evaluation and management by  telemedicine and the availability of in person appointments. The patient expressed understanding and agreed to proceed.    History of Present Illness: JEHNNA YAKEL is a 56 y.o. who identifies as a female who was assigned female at birth, and is being seen today for cough.  HPI: Cough This is a chronic problem. The current episode started in the past 7 days (acute flare restarted on Thursday, 11/11/21; However this has been a long-standing issue sine 06/2021). The problem has been waxing and waning. The problem occurs every few minutes. The cough is Non-productive. Pertinent negatives include no chills, ear congestion, ear pain, fever, headaches, myalgias, nasal congestion, postnasal drip, rhinorrhea, sore throat, shortness of breath or wheezing. The symptoms are aggravated by lying down. 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 She has tried prescription cough suppressant, a beta-agonist inhaler, body position changes and rest (Mucinex, Promethazine DM, flonase, albuterol, humidifier, steam) for the symptoms.   Has long history of this chronic cough that started in the fall 2022 after Covid 19. Thought she may have gotten Covid again in February 2023, but PCR testing ended up being negative, Home test had been positive. Was treated for acute bacterial bronchitis with doxycycline in February. Had CXR on 10/26/21 that was negative. Was scheduled to follow up with her new PCP today, but the provider  had to cancel due to an emergency, so now she does not see them again until the end of April.   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   Other meniscus  derangements, posterior horn of medial meniscus, left knee 02/19/2019   Bucket handle tear of lateral meniscus 02/19/2019   Unilateral primary osteoarthritis, right knee 12/13/2018   Panic disorder 12/13/2018   Chronic headaches 12/13/2018   Weakness 09/06/2017   Depression 09/06/2017   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   Generalized anxiety disorder with panic attacks 09/06/2017   Right hand pain    Wrist swelling    Cellulitis of right upper extremity 04/11/2016   Elbow pain 07/20/2015   Anxiety 07/20/2015   Tachycardia 07/04/2015   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HCC)    Skin ulcer of upper arm, limited to breakdown of skin (HCC) 07/01/2015    Allergies:  Allergies  Allergen Reactions   Ondansetron Rash and Swelling    Spots around iv site IV zofran   Zofran [Ondansetron Hcl] Hives and Other (See Comments)    Spots around iv site   Bromfed Dm [Pseudoeph-Bromphen-Dm] Other (See Comments)    anxiety   Diclofenac Sodium Rash   Droperidol Palpitations   Flexeril [Cyclobenzaprine] Anxiety   Morphine Itching   Morphine And Related Itching   Tessalon [Benzonatate] Other (See Comments)    Conjunctivitis, watery eyes.   Medications:  Current Outpatient Medications:    promethazine-dextromethorphan (PROMETHAZINE-DM) 6.25-15 MG/5ML syrup, Take 5 mLs by mouth 4 (four) times daily as needed., Disp: 118 mL, Rfl: 0   albuterol (VENTOLIN HFA) 108 (90 Base) MCG/ACT inhaler, Inhale 2 puffs into the lungs every 6 (six) hours as needed for wheezing or shortness of breath (Cough)., Disp: 18 g, Rfl: 1   atorvastatin (LIPITOR) 20 MG tablet, TAKE 1 TABLET BY MOUTH ONCE A DAY, Disp: 90 tablet, Rfl: 1   butalbital-acetaminophen-caffeine (FIORICET) 50-325-40 MG tablet, TAKE ONE TABLET BY MOUTH EVERY 6 HOURS AS NEEDED FOR HEADACHE, Disp: 10 tablet, Rfl: 5   diazepam (VALIUM) 10 MG tablet, Take 1 tablet (10 mg total) by mouth 3 (three) times daily., Disp: 90 tablet, Rfl: 4   gabapentin (NEURONTIN) 300 MG capsule,  Take 2 capsules (600 mg total) by mouth 3 (three) times daily., Disp: 540 capsule, Rfl: 1   ibuprofen (ADVIL) 400 MG tablet, Take 1 tablet (400 mg total) by mouth every 8 (eight) hours as needed for up to 30 doses for mild pain (Respiratory inflammation)., Disp: 30 tablet, Rfl: 0   SUMAtriptan (IMITREX) 50 MG tablet, Take 1 tablet at onset of headache with 400 mg of ibuprofen and a spoonful of peanut butter.  May take second sumatriptan tablet in 2 hours if headache persists., Disp: 10 tablet, Rfl: 0   venlafaxine XR (EFFEXOR-XR) 150 MG 24 hr capsule, Take 1 capsule (150 mg total) by mouth every morning., Disp: 90 capsule, Rfl: 1  Observations/Objective: Patient is well-developed, well-nourished in no acute distress.  Resting comfortably at home.  Head is normocephalic, atraumatic.  No labored breathing.  Speech is clear and coherent with logical content.  Patient is alert and oriented at baseline.  Dry cough heard a  few times  Assessment and Plan: 1. Post-viral cough syndrome - promethazine-dextromethorphan (PROMETHAZINE-DM) 6.25-15 MG/5ML syrup; Take 5 mLs by mouth 4 (four) times daily as needed.  Dispense: 118 mL; Refill: 0  - Suspect post viral cough syndrome with possible allergy component vs asthmatic component - Continue albuterol as needed, continue flonase for now - Restart Breo inhaler once daily and continue until seen by PCP - Promethazine DM refilled - Continue steam treatments, humidifier and saline nasal rinses - May require referral to Pulmonologist for chronic cough evaluation in the future of cough continues   Follow Up Instructions: I discussed the assessment and treatment plan with the patient. The patient was provided an opportunity to ask questions and all were answered. The patient agreed with the plan and demonstrated an understanding of the instructions.  A copy of instructions were sent to the patient via MyChart unless otherwise noted below.   The patient was  advised to call back or seek an in-person evaluation if the symptoms worsen or if the condition fails to improve as anticipated.  Time:  I spent 23 minutes with the patient via telehealth technology discussing the above problems/concerns.    Mar Daring, PA-C

## 2021-11-15 NOTE — Patient Instructions (Signed)
?Sabrina [name], thank you for joining Sabrina Loveless, PA-C for today's virtual visit.  While this provider is not your primary care provider (PCP), if your PCP is located in our provider database this encounter information will be shared with them immediately following your visit. ? ?Consent: ?(Patient) Sabrina [name] provided verbal consent for this virtual visit at the beginning of the encounter. ? ?Current Medications: ? ?Current Outpatient Medications:  ?  promethazine-dextromethorphan (PROMETHAZINE-DM) 6.25-15 MG/5ML syrup, Take 5 mLs by mouth 4 (four) times daily as needed., Disp: 118 mL, Rfl: 0 ?  albuterol (VENTOLIN HFA) 108 (90 Base) MCG/ACT inhaler, Inhale 2 puffs into the lungs every 6 (six) hours as needed for wheezing or shortness of breath (Cough)., Disp: 18 g, Rfl: 1 ?  atorvastatin (LIPITOR) 20 MG tablet, TAKE 1 TABLET BY MOUTH ONCE A DAY, Disp: 90 tablet, Rfl: 1 ?  butalbital-acetaminophen-caffeine (FIORICET) 50-325-40 MG tablet, TAKE ONE TABLET BY MOUTH EVERY 6 HOURS AS NEEDED FOR HEADACHE, Disp: 10 tablet, Rfl: 5 ?  diazepam (VALIUM) 10 MG tablet, Take 1 tablet (10 mg total) by mouth 3 (three) times daily., Disp: 90 tablet, Rfl: 4 ?  gabapentin (NEURONTIN) 300 MG capsule, Take 2 capsules (600 mg total) by mouth 3 (three) times daily., Disp: 540 capsule, Rfl: 1 ?  ibuprofen (ADVIL) 400 MG tablet, Take 1 tablet (400 mg total) by mouth every 8 (eight) hours as needed for up to 30 doses for mild pain (Respiratory inflammation)., Disp: 30 tablet, Rfl: 0 ?  SUMAtriptan (IMITREX) 50 MG tablet, Take 1 tablet at onset of headache with 400 mg of ibuprofen and a spoonful of peanut butter.  May take second sumatriptan tablet in 2 hours if headache persists., Disp: 10 tablet, Rfl: 0 ?  venlafaxine XR (EFFEXOR-XR) 150 MG 24 hr capsule, Take 1 capsule (150 mg total) by mouth every morning., Disp: 90 capsule, Rfl: 1  ? ?Medications ordered in this encounter:  ?Meds ordered this encounter  ?Medications  ?  promethazine-dextromethorphan (PROMETHAZINE-DM) 6.25-15 MG/5ML syrup  ?  Sig: Take 5 mLs by mouth 4 (four) times daily as needed.  ?  Dispense:  118 mL  ?  Refill:  0  ?  Order Specific Question:   Supervising Provider  ?  Answer:   Eber Hong [[id number]]  ?  ? ?*If you need refills on other medications prior to your next appointment, please contact your pharmacy* ? ?Follow-Up: ?Call back or seek an in-person evaluation if the symptoms worsen or if the condition fails to improve as anticipated. ? ?Other Instructions ?Cough, Adult ?Coughing is a reflex that clears your throat and your airways (respiratory system). Coughing helps to heal and protect your lungs. It is normal to cough occasionally, but a cough that happens with other symptoms or lasts a long time may be a sign of a condition that needs treatment. An acute cough may only last 2-3 weeks, while a chronic cough may last 8 or more weeks. ?Coughing is commonly caused by: ?Infection of the respiratory systemby viruses or bacteria. ?Breathing in substances that irritate your lungs. ?Allergies. ?Asthma. ?Mucus that runs down the back of your throat (postnasal drip). ?Smoking. ?Acid backing up from the stomach into the esophagus (gastroesophageal reflux). ?Certain medicines. ?Chronic lung problems. ?Other medical conditions such as heart failure or a blood clot in the lung (pulmonary embolism). ?Follow these instructions at home: ?Medicines ?Take over-the-counter and prescription medicines only as told by your health care provider. ?Talk with your health care  provider before you take a cough suppressant medicine. ?Lifestyle ? ?Avoid cigarette smoke. Do not use any products that contain nicotine or tobacco, such as cigarettes, e-cigarettes, and chewing tobacco. If you need help quitting, ask your health care provider. ?Drink enough fluid to keep your urine pale yellow. ?Avoid caffeine. ?Do not drink alcohol if your health care provider tells you not to  drink. ?General instructions ? ?Pay close attention to changes in your cough. Tell your health care provider about them. ?Always cover your mouth when you cough. ?Avoid things that make you cough, such as perfume, candles, cleaning products, or campfire or tobacco smoke. ?If the air is dry, use a cool mist vaporizer or humidifier in your bedroom or your home to help loosen secretions. ?If your cough is worse at night, try to sleep in a semi-upright position. ?Rest as needed. ?Keep all follow-up visits as told by your health care provider. This is important. ?Contact a health care provider if you: ?Have new symptoms. ?Cough up pus. ?Have a cough that does not get better after 2-3 weeks or gets worse. ?Cannot control your cough with cough suppressant medicines and you are losing sleep. ?Have pain that gets worse or pain that is not helped with medicine. ?Have a fever. ?Have unexplained weight loss. ?Have night sweats. ?Get help right away if: ?You cough up blood. ?You have difficulty breathing. ?Your heartbeat is very fast. ?These symptoms may represent a serious problem that is an emergency. Do not wait to see if the symptoms will go away. Get medical help right away. Call your local emergency services (911 in the [place] Do not drive yourself to the hospital. ?Summary ?Coughing is a reflex that clears your throat and your airways. It is normal to cough occasionally, but a cough that happens with other symptoms or lasts a long time may be a sign of a condition that needs treatment. ?Take over-the-counter and prescription medicines only as told by your health care provider. ?Always cover your mouth when you cough. ?Contact a health care provider if you have new symptoms or a cough that does not get better after 2-3 weeks or gets worse. ?This information is not intended to replace advice given to you by your health care provider. Make sure you discuss any questions you have with your health care provider. ?Document  Revised: 09/17/2018 Document Reviewed: 09/17/2018 ?Elsevier Patient Education ? 2022 Elsevier Inc. ? ? ? ?If you have been instructed to have an in-person evaluation today at a local Urgent Care facility, please use the link below. It will take you to a list of all of our available [facility], including address, phone number and hours of operation. Please do not delay care.  ?[facility] ? ?If you or a family member do not have a primary care provider, use the link below to schedule a visit and establish care. When you choose a [facility] primary care physician or advanced practice provider, you gain a long-term partner in health. ?Find a Primary Care Provider ? ?Learn more about [facility]'s in-office and virtual care options: ? - Get Care Now ?

## 2021-11-22 ENCOUNTER — Other Ambulatory Visit (HOSPITAL_COMMUNITY): Payer: Self-pay | Admitting: *Deleted

## 2021-11-22 MED ORDER — DIAZEPAM 10 MG PO TABS: ORAL_TABLET | ORAL | 0 refills | Status: DC

## 2021-12-21 ENCOUNTER — Telehealth (HOSPITAL_BASED_OUTPATIENT_CLINIC_OR_DEPARTMENT_OTHER): Payer: BLUE CROSS/BLUE SHIELD | Admitting: Psychiatry

## 2021-12-21 DIAGNOSIS — F325 Major depressive disorder, single episode, in full remission: Secondary | ICD-10-CM | POA: Diagnosis not present

## 2021-12-21 MED ORDER — DIAZEPAM 10 MG PO TABS: ORAL_TABLET | ORAL | 4 refills | Status: DC

## 2021-12-21 MED ORDER — DIAZEPAM 10 MG PO TABS: ORAL_TABLET | ORAL | 3 refills | Status: DC

## 2021-12-21 MED ORDER — VENLAFAXINE HCL ER 150 MG PO CP24: 150.0000 mg | ORAL_CAPSULE | Freq: Every morning | ORAL | 1 refills | Status: DC

## 2021-12-21 NOTE — Progress Notes (Signed)
BH MD/PA/NP OP Progress Note ? ?12/21/2021 2:08 PM ?Sabrina [name]  ?MRN:  [id number] ? ?Chief Complaint:   ? ? ?Today the patient is doing well.  She had successful knee surgery.  She is back to work.  She still rides horses.  Physically she is quite well.  Her anxiety is relatively well controlled.  Although she takes a relatively high-dose of Valium she is not oversedated.  She never misuses it.  She drinks no alcohol and uses no drugs.  Her mood is good.  She denies anxiety.  She says there are times when she feels depressed but I shared with her that Effexor 150 mg is a good dose and I think it is having an adequate benefit.  The patient ultimately will agree.  Patient is not suicidal.  She is performing very well at work.  She can think and concentrate.  She loves her job.  She is sleeping and eating well has good energy.  The patient is doing virtual group therapy for abused women.  This seems to be her therapeutic needs.  The patient is functioning extremely well. ?Virtual Visit via Telephone Note ? ?I connected with Sabrina [name] on 12/21/21 at  1:30 PM EDT by telephone and verified that I am speaking with the correct person using two identifiers. ? ?Location: ?Patient: home ?Provider: office ?  ?I discussed the limitations, risks, security and privacy concerns of performing an evaluation and management service by telephone and the availability of in person appointments. I also discussed with the patient that there may be a patient responsible charge related to this service. The patient expressed understanding and agreed to proceed. ? ? ?  ?I discussed the assessment and treatment plan with the patient. The patient was provided an opportunity to ask questions and all were answered. The patient agreed with the plan and demonstrated an understanding of the instructions. ?  ?The patient was advised to call back or seek an in-person evaluation if the symptoms worsen or if the condition fails to improve as  anticipated. ? ?I provided 30 minutes of non-face-to-face time during this encounter. ? ? ?Jerral Ralph, MD  ? ?Virtual Visit via Telephone Note ? ?I connected with Sabrina [name] on 12/21/21 at  1:30 PM EDT by telephone and verified that I am speaking with the correct person using two identifiers. ? ?Location: ?Patient: home ?Provider: office ?  ?I discussed the limitations, risks, security and privacy concerns of performing an evaluation and management service by telephone and the availability of in person appointments. I also discussed with the patient that there may be a patient responsible charge related to this service. The patient expressed understanding and agreed to proceed. ? ? ?  ?I discussed the assessment and treatment plan with the patient. The patient was provided an opportunity to ask questions and all were answered. The patient agreed with the plan and demonstrated an understanding of the instructions. ?  ?The patient was advised to call back or seek an in-person evaluation if the symptoms worsen or if the condition fails to improve as anticipated. ? ?I provided 30 minutes of non-face-to-face time during this encounter. ? ? ?Jerral Ralph, MD  ? ? ? ? ? ? ? ? ?Past Medical History:  ?Past Medical History:  ?Diagnosis Date  ? Arthritis   ? "knees" (09/06/2017)  ? Bullous pemphigus   ? Cat bite 06/2014  ? to left elbow  ? History of blood transfusion 1988  ? "  when I had my baby"  ? Muscle weakness of lower extremity 2001; 09/05/2017  ? "resolved after a couple weeks; ?" (09/06/2017)  ? 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HCC)   ? Poisoning, snake bite 04/08/2016  ? "[place]; RUE"  ? PONV (postoperative nausea and vomiting)   ? S/P right knee surgery   ? Situational anxiety   ? Staph infection ~ 2015  ? "left elbow and finger"  ?  ?Past Surgical History:  ?Procedure Laterality Date  ? APPENDECTOMY  ~ 1987  ? APPLICATION OF A-CELL OF EXTREMITY Left 08/05/2015  ? Procedure: APPLICATION OF A-CELL OF  EXTREMITY;  Surgeon: Peggye Form, DO;  Location: [facility];  Service: Plastics;  Laterality: Left;  ? BREAST SURGERY Right 1990  ? "milk duct taken out"  ? CHONDROPLASTY Right 02/19/2019  ? Procedure: CHONDROPLASTY; EXCISION EXOSTOSIS;  Surgeon: Valeria Batman, MD;  Location: [facility];  Service: Orthopedics;  Laterality: Right;  ? DEBRIDEMENT AND CLOSURE WOUND Left 07/01/2015  ? Procedure: LEFT ELBOW EXCISION OF WOUND WITH PRIMARY CLOSURE 2X5 CM ;  Surgeon: Peggye Form, DO;  Location: [facility];  Service: Plastics;  Laterality: Left;  ? ELBOW SURGERY Left X 23 in [place] <06/2015  ? from a cat bite; all I&D  ? I & D EXTREMITY Left 07/08/2015  ? Procedure: IRRIGATION AND DEBRIDEMENT EXTREMITY, DRAINAGE OF LEFT ARM WOUND, A-CELL PLACEMENT, WOUND VAC PLACEMENT;  Surgeon: Alena Bills Dillingham, DO;  Location: [facility];  Service: Plastics;  Laterality: Left;  ? INCISION AND DRAINAGE OF WOUND Left 08/05/2015  ? Procedure: IRRIGATION AND DEBRIDEMENT LEFT ELBOW WOUND, PLACEMENT OF ACELL;  Surgeon: Peggye Form, DO;  Location: [facility];  Service: Plastics;  Laterality: Left;  ? KNEE ARTHROSCOPY WITH MEDIAL MENISECTOMY Right 02/19/2019  ? Procedure: RIGHT KNEE ARTHROSCOPY, DEBRIDEMENT, PARTIAL MEDIAL AND LATERAL MENISECTOMY;  Surgeon: Valeria Batman, MD;  Location: [facility];  Service: Orthopedics;  Laterality: Right;  ? LAPAROSCOPIC CHOLECYSTECTOMY  1998  ? SKIN GRAFT Left 2016  ? took from anterior thigh; placed at elbow  ? TONSILLECTOMY  ~ 2000  ? TOTAL ABDOMINAL HYSTERECTOMY  2003  ? WRIST SURGERY Right 01/2016  ? ? ?Family Psychiatric History: See intake H&P for full details. Reviewed, with no updates at this time. ? ? ?Family History:  ?Family History  ?Problem Relation Age of Onset  ? Liver disease Mother   ? Dementia Mother   ? Cirrhosis Mother   ? Prostate cancer Father   ? Colon cancer Maternal Grandmother   ?  Ovarian cancer Maternal Aunt   ? ? ?Social History:  ?Social History  ? ?Socioeconomic History  ? Marital status: Divorced  ?  Spouse name: Not on file  ? Number of children: 1  ? Years of education: Not on file  ? Highest education level: Associate degree: academic program  ?Occupational History  ? Occupation: unemployed  ?Tobacco Use  ? Smoking status: Never  ? Smokeless tobacco: Never  ?Vaping Use  ? Vaping Use: Never used  ?Substance and Sexual Activity  ? Alcohol use: Not Currently  ?  Alcohol/week: 0.0 standard drinks  ? Drug use: No  ? Sexual activity: Not on file  ?Other Topics Concern  ? Not on file  ?Social History Narrative  ? Pateint is right-handed. She lives alone in a single level home. She rarely drinks caffeine. She is limited to exercise due to knee injuries.  ? ?Social  Determinants of Health  ? ?Financial Resource Strain: Not on file  ?Food Insecurity: Not on file  ?Transportation Needs: Not on file  ?Physical Activity: Not on file  ?Stress: Not on file  ?Social Connections: Not on file  ? ? ?Allergies:  ?Allergies  ?Allergen Reactions  ? Ondansetron Rash and Swelling  ?  Spots around iv site ?IV zofran  ? Zofran [Ondansetron Hcl] Hives and Other (See Comments)  ?  Spots around iv site  ? Bromfed Dm [Pseudoeph-Bromphen-Dm] Other (See Comments)  ?  anxiety  ? Diclofenac Sodium Rash  ? Droperidol Palpitations  ? Flexeril [Cyclobenzaprine] Anxiety  ? Morphine Itching  ? Morphine And Related Itching  ? Tessalon [Benzonatate] Other (See Comments)  ?  Conjunctivitis, watery eyes.  ? ? ?Metabolic Disorder Labs: ?Lab Results  ?Component Value Date  ? HGBA1C 5.4 03/20/2020  ? MPG 108 03/20/2020  ? MPG  07/05/2015  ?  QUESTIONABLE IDENTIFICATION / INCORRECTLY LABELED SPECIMEN  ? ?No results found for: PROLACTIN ?Lab Results  ?Component Value Date  ? CHOL 267 (H) 03/20/2020  ? TRIG 76 03/20/2020  ? HDL 67 03/20/2020  ? CHOLHDL 4.0 03/20/2020  ? VLDL 29 04/27/2015  ? LDLCALC 182 (H) 03/20/2020  ? LDLCALC 122  (H) 06/18/2018  ? ?Lab Results  ?Component Value Date  ? TSH 1.52 03/20/2020  ? TSH 2.890 06/18/2018  ? ? ?Therapeutic Level Labs: ?No results found for: LITHIUM ?No results found for: VALPROATE ?No components found fo

## 2021-12-22 ENCOUNTER — Telehealth (HOSPITAL_COMMUNITY): Payer: Self-pay | Admitting: Psychiatry

## 2021-12-31 ENCOUNTER — Telehealth (HOSPITAL_COMMUNITY): Payer: Self-pay | Admitting: Psychiatry

## 2022-01-11 ENCOUNTER — Telehealth: Payer: BLUE CROSS/BLUE SHIELD | Admitting: Physician Assistant

## 2022-01-11 ENCOUNTER — Encounter: Payer: Self-pay | Admitting: Family Medicine

## 2022-01-11 ENCOUNTER — Telehealth: Payer: BLUE CROSS/BLUE SHIELD | Admitting: Family Medicine

## 2022-01-11 DIAGNOSIS — U071 COVID-19: Secondary | ICD-10-CM | POA: Diagnosis not present

## 2022-01-11 DIAGNOSIS — R051 Acute cough: Secondary | ICD-10-CM | POA: Diagnosis not present

## 2022-01-11 MED ORDER — PROMETHAZINE-DM 6.25-15 MG/5ML PO SYRP: 2.5000 mL | ORAL_SOLUTION | Freq: Three times a day (TID) | ORAL | 0 refills | Status: DC | PRN

## 2022-01-11 NOTE — Progress Notes (Signed)
?Virtual Visit Consent  ? ?Sabrina [name], you are scheduled for a virtual visit with a [facility] provider today.   ?  ?Just as with appointments in the office, your consent must be obtained to participate.  Your consent will be active for this visit and any virtual visit you may have with one of our providers in the next 365 days.   ?  ?If you have a MyChart account, a copy of this consent can be sent to you electronically.  All virtual visits are billed to your insurance company just like a traditional visit in the office.   ? ?As this is a virtual visit, video technology does not allow for your provider to perform a traditional examination.  This may limit your provider's ability to fully assess your condition.  If your provider identifies any concerns that need to be evaluated in person or the need to arrange testing (such as labs, EKG, etc.), we will make arrangements to do so.   ?  ?Although advances in technology are sophisticated, we cannot ensure that it will always work on either your end or our end.  If the connection with a video visit is poor, the visit may have to be switched to a telephone visit.  With either a video or telephone visit, we are not always able to ensure that we have a secure connection.   ? ?Also, by engaging in this virtual visit, you consent to the provision of healthcare. Additionally, you authorize for your insurance to be billed (if applicable) for the services provided during this visit. I also discussed with the patient that there may be a patient responsible charge related to this service. ? ?I need to obtain your verbal consent now.   Are you willing to proceed with your visit today?  ?  ?BRITTINI HAYMER has provided verbal consent on 01/11/2022 for a virtual visit (video or telephone). ?  ?Freddy Finner, NP  ? ?Date: 01/11/2022 12:01 PM ? ? ?Virtual Visit via Video Note  ? ?IFreddy Finner, connected with  Sabrina [name]  ([id number], [birth year]) on 01/11/22 at 12:00 PM EDT by a  video-enabled telemedicine application and verified that I am speaking with the correct person using two identifiers. ? ?Location: ?Patient: Virtual Visit Location Patient: Home ?Provider: Virtual Visit Location Provider: Home Office ?  ?I discussed the limitations of evaluation and management by telemedicine and the availability of in person appointments. The patient expressed understanding and agreed to proceed.   ? ?History of Present Illness: ?Sabrina [name] is a 56 y.o. who identifies as a female who was assigned female at birth, and is being seen today for covid + symptoms started yesterday around midnight, they are mild and include fever, achy, tired, congestion, cough- hoarseness, mild productive- scant mucus. ? ?Works as a Administrator, [employer] and had several things going through clinic. ?Started with fever last night around 100.1- took tylenol and it stayed around that. ?COVID + co workers and she is also + ? ?Denies chest pain, shortness of breath, ear pain. ? ?Has been taking Mucinex and Delsym ? ?Has had vaccines. ? ?Problems:  ?Patient Active Problem List  ? Diagnosis Date Noted  ? Primary osteoarthritis of left knee 06/16/2020  ? Effusion, left knee 06/16/2020  ? Loose body in knee, left knee 06/16/2020  ? Vitamin D deficiency 03/24/2020  ? Hyperlipidemia 03/24/2020  ? Pain in left foot 01/21/2020  ? Primary osteoarthritis of right knee 08/06/2019  ?  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  ? Other meniscus derangements, posterior horn of medial meniscus, left knee 02/19/2019  ? Bucket handle tear of lateral meniscus 02/19/2019  ? Unilateral primary osteoarthritis, right knee 12/13/2018  ? Panic disorder 12/13/2018  ? Chronic headaches 12/13/2018  ? Weakness 09/06/2017  ? Depression 09/06/2017  ? 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  ? Generalized anxiety disorder with panic attacks 09/06/2017  ? Right hand pain   ? Wrist swelling   ? Cellulitis of right upper extremity 04/11/2016  ? Elbow pain 07/20/2015  ? Anxiety 07/20/2015  ?  Tachycardia 07/04/2015  ? 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[place])   ? Skin ulcer of upper arm, limited to breakdown of skin ([place]) 07/01/2015  ?  ?Allergies:  ?Allergies  ?Allergen Reactions  ? Ondansetron Rash and Swelling  ?  Spots around iv site ?IV zofran  ? Zofran [Ondansetron Hcl] Hives and Other (See Comments)  ?  Spots around iv site  ? Bromfed Dm [Pseudoeph-Bromphen-Dm] Other (See Comments)  ?  anxiety  ? Diclofenac Sodium Rash  ? Droperidol Palpitations  ? Flexeril [Cyclobenzaprine] Anxiety  ? Morphine Itching  ? Morphine And Related Itching  ? Tessalon [Benzonatate] Other (See Comments)  ?  Conjunctivitis, watery eyes.  ? ?Medications:  ?Current Outpatient Medications:  ?  albuterol (VENTOLIN HFA) 108 (90 Base) MCG/ACT inhaler, Inhale 2 puffs into the lungs every 6 (six) hours as needed for wheezing or shortness of breath (Cough)., Disp: 18 g, Rfl: 1 ?  atorvastatin (LIPITOR) 20 MG tablet, TAKE 1 TABLET BY MOUTH ONCE A DAY, Disp: 90 tablet, Rfl: 1 ?  butalbital-acetaminophen-caffeine (FIORICET) 50-325-40 MG tablet, TAKE ONE TABLET BY MOUTH EVERY 6 HOURS AS NEEDED FOR HEADACHE, Disp: 10 tablet, Rfl: 5 ?  diazepam (VALIUM) 10 MG tablet, Take one tablet (10 mg total) by mouth three times daily., Disp: 90 tablet, Rfl: 4 ?  gabapentin (NEURONTIN) 300 MG capsule, Take 2 capsules (600 mg total) by mouth 3 (three) times daily., Disp: 540 capsule, Rfl: 1 ?  ibuprofen (ADVIL) 400 MG tablet, Take 1 tablet (400 mg total) by mouth every 8 (eight) hours as needed for up to 30 doses for mild pain (Respiratory inflammation)., Disp: 30 tablet, Rfl: 0 ?  promethazine-dextromethorphan (PROMETHAZINE-DM) 6.25-15 MG/5ML syrup, Take 5 mLs by mouth 4 (four) times daily as needed., Disp: 118 mL, Rfl: 0 ?  SUMAtriptan (IMITREX) 50 MG tablet, Take 1 tablet at onset of headache with 400 mg of ibuprofen and a spoonful of peanut butter.  May take second sumatriptan tablet in 2 hours if headache persists., Disp: 10 tablet, Rfl: 0 ?  venlafaxine XR  (EFFEXOR-XR) 150 MG 24 hr capsule, Take 1 capsule (150 mg total) by mouth every morning., Disp: 90 capsule, Rfl: 1 ? ?Observations/Objective: ?Patient is well-developed, well-nourished in no acute distress.  ?Resting comfortably  at home.  ?Head is normocephalic, atraumatic.  ?No labored breathing.  ?Speech is clear and coherent with logical content.  ?Patient is alert and oriented at baseline.  ? ? ?Assessment and Plan: ?1. COVID-19 ? ?- promethazine-dextromethorphan (PROMETHAZINE-DM) 6.25-15 MG/5ML syrup; Take 2.5 mLs by mouth 3 (three) times daily as needed for cough.  Dispense: 118 mL; Refill: 0 ? ?2. Acute cough ?- promethazine-dextromethorphan (PROMETHAZINE-DM) 6.25-15 MG/5ML syrup; Take 2.5 mLs by mouth 3 (three) times daily as needed for cough.  Dispense: 118 mL; Refill: 0 ? ?-+ HT for covid, recent coworkers sick as well ?-mild so will avoid paxlovid at this time- of note reports having taken this back  in Feb and review of chart show this as well.  ?-will do cough syrup as this is her number one symptom. ?-advised follow up in person if no improved  ?-additionally she just completed a zpack back in April and has been seen and treated several times for bronchitis over the last several months. Might benefit from med adjustment and inhaler use daily.  ? ? ?Follow Up Instructions: ?I discussed the assessment and treatment plan with the patient. The patient was provided an opportunity to ask questions and all were answered. The patient agreed with the plan and demonstrated an understanding of the instructions.  A copy of instructions were sent to the patient via MyChart unless otherwise noted below.  ? ?The patient was advised to call back or seek an in-person evaluation if the symptoms worsen or if the condition fails to improve as anticipated. ? ?Time:  ?I spent 25 minutes with the patient via telehealth technology discussing the above problems/concerns.   ? ?Perlie Mayo, NP ? ?

## 2022-01-11 NOTE — Patient Instructions (Signed)
COVID-19: What to Do if You Are Sick ?If you test positive and are an older adult or someone who is at high risk of getting very sick from COVID-19, treatment may be available. Contact a healthcare provider right away after a positive test to determine if you are eligible, even if your symptoms are mild right now. You can also visit a Test to Treat location and, if eligible, receive a prescription from a provider. Don't delay: Treatment must be started within the first few days to be effective. ?If you have a fever, cough, or other symptoms, you might have COVID-19. Most people have mild illness and are able to recover at home. If you are sick: ?Keep track of your symptoms. ?If you have an emergency warning sign (including trouble breathing), call 911. ?Steps to help prevent the spread of COVID-19 if you are sick ?If you are sick with COVID-19 or think you might have COVID-19, follow the steps below to care for yourself and to help protect other people in your home and community. ?Stay home except to get medical care ?Stay home. Most people with COVID-19 have mild illness and can recover at home without medical care. Do not leave your home, except to get medical care. Do not visit public areas and do not go to places where you are unable to wear a mask. ?Take care of yourself. Get rest and stay hydrated. Take over-the-counter medicines, such as acetaminophen, to help you feel better. ?Stay in touch with your doctor. Call before you get medical care. Be sure to get care if you have trouble breathing, or have any other emergency warning signs, or if you think it is an emergency. ?Avoid public transportation, ride-sharing, or taxis if possible. ?Get tested ?If you have symptoms of COVID-19, get tested. While waiting for test results, stay away from others, including staying apart from those living in your household. ?Get tested as soon as possible after your symptoms start. Treatments may be available for people with  COVID-19 who are at risk for becoming very sick. Don't delay: Treatment must be started early to be effective--some treatments must begin within 5 days of your first symptoms. Contact your healthcare provider right away if your test result is positive to determine if you are eligible. ?Self-tests are one of several options for testing for the virus that causes COVID-19 and may be more convenient than laboratory-based tests and point-of-care tests. Ask your healthcare provider or your local health department if you need help interpreting your test results. ?You can visit your state, tribal, local, and territorial health department's website to look for the latest local information on testing sites. ?Separate yourself from other people ?As much as possible, stay in a specific room and away from other people and pets in your home. If possible, you should use a separate bathroom. If you need to be around other people or animals in or outside of the home, wear a well-fitting mask. ?Tell your close contacts that they may have been exposed to COVID-19. An infected person can spread COVID-19 starting 48 hours (or 2 days) before the person has any symptoms or tests positive. By letting your close contacts know they may have been exposed to COVID-19, you are helping to protect everyone. ?See COVID-19 and Animals if you have questions about pets. ?If you are diagnosed with COVID-19, someone from the health department may call you. Answer the call to slow the spread. ?Monitor your symptoms ?Symptoms of COVID-19 include fever, cough, or other   symptoms. ?Follow care instructions from your healthcare provider and local health department. Your local health authorities may give instructions on checking your symptoms and reporting information. ?When to seek emergency medical attention ?Look for emergency warning signs* for COVID-19. If someone is showing any of these signs, seek emergency medical care immediately: ?Trouble  breathing ?Persistent pain or pressure in the chest ?New confusion ?Inability to wake or stay awake ?Pale, gray, or blue-colored skin, lips, or nail beds, depending on skin tone ?*This list is not all possible symptoms. Please call your medical provider for any other symptoms that are severe or concerning to you. ?Call 911 or call ahead to your local emergency facility: Notify the operator that you are seeking care for someone who has or may have COVID-19. ?Call ahead before visiting your doctor ?Call ahead. Many medical visits for routine care are being postponed or done by phone or telemedicine. ?If you have a medical appointment that cannot be postponed, call your doctor's office, and tell them you have or may have COVID-19. This will help the office protect themselves and other patients. ?If you are sick, wear a well-fitting mask ?You should wear a mask if you must be around other people or animals, including pets (even at home). ?Wear a mask with the best fit, protection, and comfort for you. ?You don't need to wear the mask if you are alone. If you can't put on a mask (because of trouble breathing, for example), cover your coughs and sneezes in some other way. Try to stay at least 6 feet away from other people. This will help protect the people around you. ?Masks should not be placed on young children under age 2 years, anyone who has trouble breathing, or anyone who is not able to remove the mask without help. ?Cover your coughs and sneezes ?Cover your mouth and nose with a tissue when you cough or sneeze. ?Throw away used tissues in a lined trash can. ?Immediately wash your hands with soap and water for at least 20 seconds. If soap and water are not available, clean your hands with an alcohol-based hand sanitizer that contains at least 60% alcohol. ?Clean your hands often ?Wash your hands often with soap and water for at least 20 seconds. This is especially important after blowing your nose, coughing, or  sneezing; going to the bathroom; and before eating or preparing food. ?Use hand sanitizer if soap and water are not available. Use an alcohol-based hand sanitizer with at least 60% alcohol, covering all surfaces of your hands and rubbing them together until they feel dry. ?Soap and water are the best option, especially if hands are visibly dirty. ?Avoid touching your eyes, nose, and mouth with unwashed hands. ?Handwashing Tips ?Avoid sharing personal household items ?Do not share dishes, drinking glasses, cups, eating utensils, towels, or bedding with other people in your home. ?Wash these items thoroughly after using them with soap and water or put in the dishwasher. ?Clean surfaces in your home regularly ?Clean and disinfect high-touch surfaces (for example, doorknobs, tables, handles, light switches, and countertops) in your "sick room" and bathroom. In shared spaces, you should clean and disinfect surfaces and items after each use by the person who is ill. ?If you are sick and cannot clean, a caregiver or other person should only clean and disinfect the area around you (such as your bedroom and bathroom) on an as needed basis. Your caregiver/other person should wait as long as possible (at least several hours) and wear a   mask before entering, cleaning, and disinfecting shared spaces that you use. ?Clean and disinfect areas that may have blood, stool, or body fluids on them. ?Use household cleaners and disinfectants. Clean visible dirty surfaces with household cleaners containing soap or detergent. Then, use a household disinfectant. ?Use a product from EPA's List N: Disinfectants for Coronavirus (COVID-19). ?Be sure to follow the instructions on the label to ensure safe and effective use of the product. Many products recommend keeping the surface wet with a disinfectant for a certain period of time (look at "contact time" on the product label). ?You may also need to wear personal protective equipment, such as  gloves, depending on the directions on the product label. ?Immediately after disinfecting, wash your hands with soap and water for 20 seconds. ?For completed guidance on cleaning and disinfecting your home, visit [facility]

## 2022-01-11 NOTE — Progress Notes (Signed)
Patient rescheduled for 12:00 ?

## 2022-01-12 ENCOUNTER — Telehealth: Payer: BLUE CROSS/BLUE SHIELD | Admitting: Family Medicine

## 2022-01-12 DIAGNOSIS — U071 COVID-19: Secondary | ICD-10-CM

## 2022-01-12 DIAGNOSIS — R051 Acute cough: Secondary | ICD-10-CM

## 2022-01-13 ENCOUNTER — Telehealth: Payer: BLUE CROSS/BLUE SHIELD | Admitting: Family Medicine

## 2022-01-13 ENCOUNTER — Other Ambulatory Visit (HOSPITAL_COMMUNITY): Payer: Self-pay

## 2022-01-13 ENCOUNTER — Ambulatory Visit
Admission: EM | Admit: 2022-01-13 | Discharge: 2022-01-13 | Disposition: A | Discharge status: Home / Self Care | Payer: BLUE CROSS/BLUE SHIELD | Attending: Emergency Medicine | Admitting: Emergency Medicine

## 2022-01-13 ENCOUNTER — Other Ambulatory Visit: Payer: Self-pay

## 2022-01-13 DIAGNOSIS — U071 COVID-19: Secondary | ICD-10-CM | POA: Diagnosis not present

## 2022-01-13 DIAGNOSIS — R051 Acute cough: Secondary | ICD-10-CM

## 2022-01-13 DIAGNOSIS — R059 Cough, unspecified: Secondary | ICD-10-CM

## 2022-01-13 MED ORDER — ALBUTEROL SULFATE HFA 108 (90 BASE) MCG/ACT IN AERS: 2.0000 | INHALATION_SPRAY | Freq: Four times a day (QID) | RESPIRATORY_TRACT | 1 refills | Status: DC | PRN

## 2022-01-13 MED ORDER — PROMETHAZINE-DM 6.25-15 MG/5ML PO SYRP: 5.0000 mL | ORAL_SOLUTION | Freq: Four times a day (QID) | ORAL | 0 refills | Status: DC | PRN

## 2022-01-13 MED ORDER — DEXAMETHASONE 6 MG PO TABS: 6.0000 mg | ORAL_TABLET | Freq: Two times a day (BID) | ORAL | 0 refills | Status: AC

## 2022-01-13 NOTE — Discharge Instructions (Signed)
According to the prescription that was written by your primary care provider, you were advised to take 2.5 mL of Promethazine DM.  The correct dose is actually 5 mL 4 times daily.  I have renewed your prescription and I do not believe that your pharmacist should have trouble filling it because the dose is different. ? ?Please begin dexamethasone 6 mg, 1 tablet twice daily for the next 5 days.  This is a steroid that works well in patients affected by [place].  It comes to the lung tissue and should significantly calm your cough as well. ? ?I also renewed your prescription for albuterol as well, please inhale 2 puffs 4 times daily preventatively.  We will complement the dexamethasone well. ? ?As we discussed, it is recommended that you sleep on your stomach with COVID-19 to keep the lungs open and to your risk of pneumonia.  I enclosed a little information to read about COVID-19 (in case you have trouble falling asleep?). ? ?You for visiting urgent care today.  We hope you feel better soon.  We appreciate the opportunity to participate in your care. ?

## 2022-01-13 NOTE — Progress Notes (Signed)
[facility]  ? ?Duplicate EV did one last night I will respond to (same EV code) ?

## 2022-01-13 NOTE — Progress Notes (Signed)
[facility]  ? ?Error pt filled out 3 EV- for same symptoms- will respond on one. ?Message was sent about this to patient.  ? ?Possible need for in person given the degree of symptoms and failure to improve.  ?

## 2022-01-13 NOTE — Progress Notes (Signed)
[facility]  ? ?Patient submitted 3 EV overnight and this morning- cough is worse and reports cough syrup is not helping as much. Also reports steroids and other medications are not helpful in past. Given previous video visit this week and these worsening symptoms she was recommended to be seen in person.  ? ?Message sent on this EV  ? ?All other EVs were cleared by [place]/error ?

## 2022-01-13 NOTE — ED Triage Notes (Signed)
Pt c/o cough that began last weekend. She reports having a positive Covid test on Wednesday.  ?

## 2022-01-13 NOTE — ED Provider Notes (Addendum)
[facility]    CSN: [id number] Arrival date & time: 01/13/22  7829    HISTORY   Chief Complaint  Patient presents with   Cough   Covid Positive   HPI Sabrina [name] is a 56 y.o. female. Patient complains of a cough that began 6 days ago.  Patient reports two positive home COVID tests, the first was 3 days ago and the second was yesterday.  Patient states she has been managing the cough well during the day with Delsym however her nighttime cough is not responding to the Promethazine DM 2.5 mL prescription provided to her by her primary care provider 2 days ago which would have been 4 days into her illness.  Her provider did not provide her with the antiviral Paxlovid despite her positive COVID-19 status and historically normal GFR.  Vital signs are normal on arrival.  Patient has not coughed she has been in the clinic.  Patient states the cough is not productive of sputum.  Patient states the cough keeps her awake at night.  Patient denies shortness of breath with cough.  The history is provided by the patient.  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HCC)    Poisoning, snake bite 04/08/2016   "[place]; RUE"   PONV (postoperative nausea and vomiting)    S/P right knee surgery    Situational anxiety    Staph infection ~ 2015   "left elbow and finger"   Patient Active Problem List   Diagnosis Date Noted   Primary osteoarthritis of left knee 06/16/2020   Effusion, left knee 06/16/2020   Loose body in knee, left knee 06/16/2020   Vitamin D deficiency 03/24/2020   Hyperlipidemia 03/24/2020   Pain in left foot 01/21/2020   Primary osteoarthritis of right knee 08/06/2019   RSD lower limb  03/05/2019   Other meniscus derangements, posterior horn of medial meniscus, left knee 02/19/2019   Bucket handle tear of lateral meniscus 02/19/2019   Unilateral primary osteoarthritis, right knee 12/13/2018   Panic disorder 12/13/2018   Chronic headaches 12/13/2018   Weakness 09/06/2017   Depression 09/06/2017   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   Generalized anxiety disorder with panic attacks 09/06/2017   Right hand pain    Wrist swelling    Cellulitis of right upper extremity 04/11/2016   Elbow pain 07/20/2015   Anxiety 07/20/2015   Tachycardia 07/04/2015   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HCC)    Skin ulcer of upper arm, limited to breakdown of skin (HCC) 07/01/2015   Past Surgical History:  Procedure Laterality Date   APPENDECTOMY  ~ 1987   APPLICATION OF A-CELL OF EXTREMITY Left 08/05/2015   Procedure: APPLICATION OF A-CELL OF EXTREMITY;  Surgeon: Peggye Form, DO;  Location: [facility];  Service: Plastics;  Laterality: Left;   BREAST SURGERY Right 1990   "milk duct taken out"   CHONDROPLASTY Right 02/19/2019   Procedure: CHONDROPLASTY; EXCISION EXOSTOSIS;  Surgeon: Valeria Batman, MD;  Location: [facility];  Service: Orthopedics;  Laterality: Right;   DEBRIDEMENT AND CLOSURE WOUND Left 07/01/2015   Procedure: LEFT ELBOW EXCISION OF WOUND WITH PRIMARY CLOSURE 2X5  CM ;  Surgeon: Peggye Form, DO;  Location: [facility];  Service: Plastics;  Laterality: Left;   ELBOW SURGERY Left X 23 in [place] <06/2015   from a cat bite; all I&D   I & D EXTREMITY Left 07/08/2015   Procedure: IRRIGATION AND DEBRIDEMENT EXTREMITY, DRAINAGE OF LEFT ARM WOUND, A-CELL PLACEMENT, WOUND VAC PLACEMENT;  Surgeon: Alena Bills Dillingham, DO;  Location: [facility];  Service: Plastics;  Laterality: Left;   INCISION AND DRAINAGE OF WOUND Left 08/05/2015   Procedure: IRRIGATION AND DEBRIDEMENT LEFT ELBOW WOUND, PLACEMENT OF ACELL;  Surgeon: Peggye Form, DO;   Location: [facility];  Service: Plastics;  Laterality: Left;   KNEE ARTHROSCOPY WITH MEDIAL MENISECTOMY Right 02/19/2019   Procedure: RIGHT KNEE ARTHROSCOPY, DEBRIDEMENT, PARTIAL MEDIAL AND LATERAL MENISECTOMY;  Surgeon: Valeria Batman, MD;  Location: [facility];  Service: Orthopedics;  Laterality: Right;   LAPAROSCOPIC CHOLECYSTECTOMY  1998   SKIN GRAFT Left 2016   took from anterior thigh; placed at elbow   TONSILLECTOMY  ~ 2000   TOTAL ABDOMINAL HYSTERECTOMY  2003   WRIST SURGERY Right 01/2016   OB History   No obstetric history on file.    Home Medications    Prior to Admission medications   Medication Sig Start Date End Date Taking? Authorizing Provider  albuterol (VENTOLIN HFA) 108 (90 Base) MCG/ACT inhaler Inhale 2 puffs into the lungs every 6 (six) hours as needed for wheezing or shortness of breath (Cough). 09/24/21   Theadora Rama Scales, PA-C  atorvastatin (LIPITOR) 20 MG tablet TAKE 1 TABLET BY MOUTH ONCE A DAY 03/31/21 03/31/22  Philip Aspen, Limmie Patricia, MD  butalbital-acetaminophen-caffeine (FIORICET) [phone] MG tablet TAKE ONE TABLET BY MOUTH EVERY 6 HOURS AS NEEDED FOR HEADACHE 10/14/21   Everlena Cooper, Adam R, DO  diazepam (VALIUM) 10 MG tablet Take one tablet (10 mg total) by mouth three times daily. 12/21/21   Plovsky, Earvin Hansen, MD  gabapentin (NEURONTIN) 300 MG capsule Take 2 capsules (600 mg total) by mouth 3 (three) times daily. 08/10/21   Philip Aspen, Limmie Patricia, MD  ibuprofen (ADVIL) 400 MG tablet Take 1 tablet (400 mg total) by mouth every 8 (eight) hours as needed for up to 30 doses for mild pain (Respiratory inflammation). 09/24/21   Theadora Rama Scales, PA-C  promethazine-dextromethorphan (PROMETHAZINE-DM) 6.25-15 MG/5ML syrup Take 2.5 mLs by mouth 3 (three) times daily as needed for cough. 01/11/22   Freddy Finner, NP  SUMAtriptan (IMITREX) 50 MG tablet Take 1 tablet at onset of headache with 400 mg of ibuprofen and a spoonful of peanut  butter.  May take second sumatriptan tablet in 2 hours if headache persists. 09/24/21   Theadora Rama Scales, PA-C  venlafaxine XR (EFFEXOR-XR) 150 MG 24 hr capsule Take 1 capsule (150 mg total) by mouth every morning. 12/21/21 12/21/22  Archer Asa, MD   Family History Family History  Problem Relation Age of Onset   Liver disease Mother    Dementia Mother    Cirrhosis Mother    Prostate cancer Father    Colon cancer Maternal Grandmother    Ovarian cancer Maternal Aunt    Social History Social History   Tobacco Use   Smoking status: Never   Smokeless tobacco: Never  Vaping Use   Vaping Use: Never used  Substance Use Topics   Alcohol use: Not Currently    Alcohol/week: 0.0 standard drinks   Drug use: No   Allergies   Ondansetron,  Zofran [ondansetron hcl], Bromfed dm [pseudoeph-bromphen-dm], Diclofenac sodium, Droperidol, Flexeril [cyclobenzaprine], Morphine, Morphine and related, and Tessalon [benzonatate]  Review of Systems Review of Systems Pertinent findings noted in history of present illness.   Physical Exam Triage Vital Signs ED Triage Vitals  Enc Vitals Group     BP 07/09/21 0827 (!) 147/82     Pulse Rate 07/09/21 0827 72     Resp 07/09/21 0827 18     Temp 07/09/21 0827 98.3 F (36.8 C)     Temp Source 07/09/21 0827 Oral     SpO2 07/09/21 0827 98 %     Weight --      Height --      Head Circumference --      Peak Flow --      Pain Score 07/09/21 0826 5     Pain Loc --      Pain Edu? --      Excl. in GC? --   No data found.  Updated Vital Signs BP 115/82 (BP Location: Right Arm)   Pulse 99   Temp 97.9 F (36.6 C) (Oral)   Resp 18   SpO2 98%   Physical Exam Vitals and nursing note reviewed.  Constitutional:      General: She is not in acute distress.    Appearance: Normal appearance. She is not ill-appearing.  HENT:     Head: Normocephalic and atraumatic.     Salivary Glands: Right salivary gland is not diffusely enlarged or tender. Left  salivary gland is not diffusely enlarged or tender.     Right Ear: Tympanic membrane, ear canal and external ear normal. No drainage. No middle ear effusion. There is no impacted cerumen. Tympanic membrane is not erythematous or bulging.     Left Ear: Tympanic membrane, ear canal and external ear normal. No drainage.  No middle ear effusion. There is no impacted cerumen. Tympanic membrane is not erythematous or bulging.     Nose: Nose normal. No nasal deformity, septal deviation, mucosal edema, congestion or rhinorrhea.     Right Turbinates: Not enlarged, swollen or pale.     Left Turbinates: Not enlarged, swollen or pale.     Right Sinus: No maxillary sinus tenderness or frontal sinus tenderness.     Left Sinus: No maxillary sinus tenderness or frontal sinus tenderness.     Mouth/Throat:     Lips: Pink. No lesions.     Mouth: Mucous membranes are moist. No oral lesions.     Pharynx: Oropharynx is clear. Uvula midline. No posterior oropharyngeal erythema or uvula swelling.     Tonsils: No tonsillar exudate. 0 on the right. 0 on the left.  Eyes:     General: Lids are normal.        Right eye: No discharge.        Left eye: No discharge.     Extraocular Movements: Extraocular movements intact.     Conjunctiva/sclera: Conjunctivae normal.     Right eye: Right conjunctiva is not injected.     Left eye: Left conjunctiva is not injected.  Neck:     Trachea: Trachea and phonation normal.  Cardiovascular:     Rate and Rhythm: Normal rate and regular rhythm.     Pulses: Normal pulses.     Heart sounds: Normal heart sounds. No murmur heard.   No friction rub. No gallop.  Pulmonary:     Effort: Pulmonary effort is normal. No accessory muscle usage, prolonged expiration or respiratory distress.  Breath sounds: Normal breath sounds. No stridor, decreased air movement or transmitted upper airway sounds. No decreased breath sounds, wheezing, rhonchi or rales.  Chest:     Chest wall: No  tenderness.  Musculoskeletal:        General: Normal range of motion.     Cervical back: Normal range of motion and neck supple. Normal range of motion.  Lymphadenopathy:     Cervical: No cervical adenopathy.  Skin:    General: Skin is warm and dry.     Findings: No erythema or rash.  Neurological:     General: No focal deficit present.     Mental Status: She is alert and oriented to person, place, and time.  Psychiatric:        Mood and Affect: Mood normal.        Behavior: Behavior normal.    Visual Acuity Right Eye Distance:   Left Eye Distance:   Bilateral Distance:    Right Eye Near:   Left Eye Near:    Bilateral Near:     UC Couse / Diagnostics / Procedures:    EKG  Radiology No results found.  Procedures Procedures (including critical care time)  UC Diagnoses / Final Clinical Impressions(s)   I have reviewed the triage vital signs and the nursing notes.  Pertinent labs & imaging results that were available during my care of the patient were reviewed by me and considered in my medical decision making (see chart for details).   Final diagnoses:  COVID-19  Acute cough   Patient provided with a prescription of Promethazine DM 5 mL and dexamethasone 6 mg twice daily for 5 days.  Patient advised to follow-up with primary care.  ED Prescriptions     Medication Sig Dispense Auth. Provider   albuterol (VENTOLIN HFA) 108 (90 Base) MCG/ACT inhaler Inhale 2 puffs into the lungs every 6 (six) hours as needed for wheezing or shortness of breath (Cough). 18 g Theadora Rama Scales, PA-C   dexamethasone (DECADRON) 6 MG tablet Take 1 tablet (6 mg total) by mouth 2 (two) times daily with a meal for 5 days. 10 tablet Theadora Rama Scales, PA-C   promethazine-dextromethorphan (PROMETHAZINE-DM) 6.25-15 MG/5ML syrup Take 5 mLs by mouth 4 (four) times daily as needed for cough. 180 mL Theadora Rama Scales, PA-C      PDMP not reviewed this encounter.  Pending results:   Labs Reviewed - No data to display  Medications Ordered in UC: Medications - No data to display  Disposition Upon Discharge:  Condition: stable for discharge home Home: take medications as prescribed; routine discharge instructions as discussed; follow up as advised.  Patient presented with an acute illness with associated systemic symptoms and significant discomfort requiring urgent management. In my opinion, this is a condition that a prudent lay person (someone who possesses an average knowledge of health and medicine) may potentially expect to result in complications if not addressed urgently such as respiratory distress, impairment of bodily function or dysfunction of bodily organs.   Routine symptom specific, illness specific and/or disease specific instructions were discussed with the patient and/or caregiver at length.   As such, the patient has been evaluated and assessed, work-up was performed and treatment was provided in alignment with urgent care protocols and evidence based medicine.  Patient/parent/caregiver has been advised that the patient may require follow up for further testing and treatment if the symptoms continue in spite of treatment, as clinically indicated and appropriate.  If the patient was  tested for COVID-19, Influenza and/or RSV, then the patient/parent/guardian was advised to isolate at home pending the results of his/her diagnostic coronavirus test and potentially longer if they're positive. I have also advised pt that if his/her COVID-19 test returns positive, it's recommended to self-isolate for at least 10 days after symptoms first appeared AND until fever-free for 24 hours without fever reducer AND other symptoms have improved or resolved. Discussed self-isolation recommendations as well as instructions for household member/close contacts as per the [facility] and [place] DHHS, and also gave patient the COVID packet with this information.  Patient/parent/caregiver has been  advised to return to the [facility] or PCP in 3-5 days if no better; to PCP or the Emergency Department if new signs and symptoms develop, or if the current signs or symptoms continue to change or worsen for further workup, evaluation and treatment as clinically indicated and appropriate  The patient will follow up with their current PCP if and as advised. If the patient does not currently have a PCP we will assist them in obtaining one.   The patient may need specialty follow up if the symptoms continue, in spite of conservative treatment and management, for further workup, evaluation, consultation and treatment as clinically indicated and appropriate.  Patient/parent/caregiver verbalized understanding and agreement of plan as discussed.  All questions were addressed during visit.  Please see discharge instructions below for further details of plan.  Discharge Instructions:   Discharge Instructions      According to the prescription that was written by your primary care provider, you were advised to take 2.5 mL of Promethazine DM.  The correct dose is actually 5 mL 4 times daily.  I have renewed your prescription and I do not believe that your pharmacist should have trouble filling it because the dose is different.  Please begin dexamethasone 6 mg, 1 tablet twice daily for the next 5 days.  This is a steroid that works well in patients affected by COVID.  It comes to the lung tissue and should significantly calm your cough as well.  I also renewed your prescription for albuterol as well, please inhale 2 puffs 4 times daily preventatively.  We will complement the dexamethasone well.  As we discussed, it is recommended that you sleep on your stomach with COVID-19 to keep the lungs open and to your risk of pneumonia.  I enclosed a little information to read about COVID-19 (in case you have trouble falling asleep.).  You for visiting urgent care today.  We hope you feel better soon.  We appreciate the  opportunity to participate in your care.      This office note has been dictated using Teaching laboratory technician.  Unfortunately, and despite my best efforts, this method of dictation can sometimes lead to occasional typographical or grammatical errors.  I apologize in advance if this occurs.     Theadora Rama Scales, PA-C 01/13/22 1244    Theadora Rama Scales, PA-C 01/13/22 1245    Theadora Rama [place], [place] 01/13/22 1246

## 2022-01-15 ENCOUNTER — Telehealth: Payer: BLUE CROSS/BLUE SHIELD | Admitting: Nurse Practitioner

## 2022-01-15 DIAGNOSIS — U071 COVID-19: Secondary | ICD-10-CM

## 2022-01-15 DIAGNOSIS — R051 Acute cough: Secondary | ICD-10-CM

## 2022-01-15 MED ORDER — PSEUDOEPH-BROMPHEN-DM 30-2-10 MG/5ML PO SYRP: 5.0000 mL | ORAL_SOLUTION | Freq: Four times a day (QID) | ORAL | 0 refills | Status: DC | PRN

## 2022-01-15 NOTE — Progress Notes (Signed)
? ?Virtual Visit Consent  ? ?Sabrina [name], you are scheduled for a virtual visit with Sabrina Daphine Deutscher, FNP, a [facility] provider, today.   ?  ?Just as with appointments in the office, your consent must be obtained to participate.  Your consent will be active for this visit and any virtual visit you may have with one of our providers in the next 365 days.   ?  ?If you have a MyChart account, a copy of this consent can be sent to you electronically.  All virtual visits are billed to your insurance company just like a traditional visit in the office.   ? ?As this is a virtual visit, video technology does not allow for your provider to perform a traditional examination.  This may limit your provider's ability to fully assess your condition.  If your provider identifies any concerns that need to be evaluated in person or the need to arrange testing (such as labs, EKG, etc.), we will make arrangements to do so.   ?  ?Although advances in technology are sophisticated, we cannot ensure that it will always work on either your end or our end.  If the connection with a video visit is poor, the visit may have to be switched to a telephone visit.  With either a video or telephone visit, we are not always able to ensure that we have a secure connection.    ? ?I need to obtain your verbal consent now.   Are you willing to proceed with your visit today? YES ?  ?Sabrina [name] has provided verbal consent on 01/15/2022 for a virtual visit (video or telephone). ?  ?Sabrina Daphine Deutscher, FNP  ? ?Date: 01/15/2022 7:29 PM ? ? ?Virtual Visit via Video Note  ? ?I, Sabrina [name], connected with Sabrina [name] ([id number], Feb 07, 1966) on 01/15/22 at  7:30 PM EDT by a video-enabled telemedicine application and verified that I am speaking with the correct person using two identifiers. ? ?Location: ?Patient: Virtual Visit Location Patient: Home ?Provider: Virtual Visit Location Provider: Mobile ?  ?I discussed the limitations of  evaluation and management by telemedicine and the availability of in person appointments. The patient expressed understanding and agreed to proceed.   ? ?History of Present Illness: ?Sabrina [name] is a 56 y.o. who identifies as a female who was assigned female at birth, and is being seen today for covid positive. ? ?HPI: Patient states she tested positive for covid  on 01/09/22. She works in Arboriculturist. SHe tested again on wedneday and was still positive. SHe has had 4 covid vaccines.  ? ?URI  ?This is a new problem. The current episode started in the past 7 days. The problem has been gradually worsening. The maximum temperature recorded prior to her arrival was 100.4 - 100.9 F. The fever has been present for 3 to 4 days. Associated symptoms include congestion, coughing, headaches, rhinorrhea and a sore throat. She has tried acetaminophen (cough meds and steroids) for the symptoms. The treatment provided mild relief.   ?Review of Systems  ?HENT:  Positive for congestion, rhinorrhea and sore throat.   ?Respiratory:  Positive for cough.   ?Neurological:  Positive for headaches.  ? ?Problems:  ?Patient Active Problem List  ? Diagnosis Date Noted  ? Primary osteoarthritis of left knee 06/16/2020  ? Effusion, left knee 06/16/2020  ? Loose body in knee, left knee 06/16/2020  ? Vitamin D deficiency 03/24/2020  ? Hyperlipidemia 03/24/2020  ? Pain in left foot  01/21/2020  ? Primary osteoarthritis of right knee 08/06/2019  ? 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  ? Other meniscus derangements, posterior horn of medial meniscus, left knee 02/19/2019  ? Bucket handle tear of lateral meniscus 02/19/2019  ? Unilateral primary osteoarthritis, right knee 12/13/2018  ? Panic disorder 12/13/2018  ? Chronic headaches 12/13/2018  ? Weakness 09/06/2017  ? Depression 09/06/2017  ? 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  ? Generalized anxiety disorder with panic attacks 09/06/2017  ? Right hand pain   ? Wrist swelling   ? Cellulitis of right upper  extremity 04/11/2016  ? Elbow pain 07/20/2015  ? Anxiety 07/20/2015  ? Tachycardia 07/04/2015  ? 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HCC)   ? Skin ulcer of upper arm, limited to breakdown of skin (HCC) 07/01/2015  ?  ?Allergies:  ?Allergies  ?Allergen Reactions  ? Ondansetron Rash and Swelling  ?  Spots around iv site ?IV zofran  ? Zofran [Ondansetron Hcl] Hives and Other (See Comments)  ?  Spots around iv site  ? Bromfed Dm [Pseudoeph-Bromphen-Dm] Other (See Comments)  ?  anxiety  ? Diclofenac Sodium Rash  ? Droperidol Palpitations  ? Flexeril [Cyclobenzaprine] Anxiety  ? Morphine Itching  ? Morphine And Related Itching  ? Tessalon [Benzonatate] Other (See Comments)  ?  Conjunctivitis, watery eyes.  ? ?Medications:  ?Current Outpatient Medications:  ?  albuterol (VENTOLIN HFA) 108 (90 Base) MCG/ACT inhaler, Inhale 2 puffs into the lungs every 6 (six) hours as needed for wheezing or shortness of breath (Cough)., Disp: 18 g, Rfl: 1 ?  atorvastatin (LIPITOR) 20 MG tablet, TAKE 1 TABLET BY MOUTH ONCE A DAY, Disp: 90 tablet, Rfl: 1 ?  butalbital-acetaminophen-caffeine (FIORICET) 50-325-40 MG tablet, TAKE ONE TABLET BY MOUTH EVERY 6 HOURS AS NEEDED FOR HEADACHE, Disp: 10 tablet, Rfl: 5 ?  dexamethasone (DECADRON) 6 MG tablet, Take 1 tablet (6 mg total) by mouth 2 (two) times daily with a meal for 5 days., Disp: 10 tablet, Rfl: 0 ?  diazepam (VALIUM) 10 MG tablet, Take one tablet (10 mg total) by mouth three times daily., Disp: 90 tablet, Rfl: 4 ?  gabapentin (NEURONTIN) 300 MG capsule, Take 2 capsules (600 mg total) by mouth 3 (three) times daily., Disp: 540 capsule, Rfl: 1 ?  promethazine-dextromethorphan (PROMETHAZINE-DM) 6.25-15 MG/5ML syrup, Take 5 mLs by mouth 4 (four) times daily as needed for cough., Disp: 180 mL, Rfl: 0 ?  SUMAtriptan (IMITREX) 50 MG tablet, Take 1 tablet at onset of headache with 400 mg of ibuprofen and a spoonful of peanut butter.  May take second sumatriptan tablet in 2 hours if headache persists., Disp: 10  tablet, Rfl: 0 ?  venlafaxine XR (EFFEXOR-XR) 150 MG 24 hr capsule, Take 1 capsule (150 mg total) by mouth every morning., Disp: 90 capsule, Rfl: 1 ? ?Observations/Objective: ?Patient is well-developed, well-nourished in no acute distress.  ?Resting comfortably  at home.  ?Head is normocephalic, atraumatic.  ?No labored breathing.  ?Speech is clear and coherent with logical content.  ?Patient is alert and oriented at baseline.  ?No cough during visit. ? ?Assessment and Plan: ? ?Sabrina [name] in today with chief complaint of No chief complaint on file. ? ? ?1. Acute cough ? ?2. COVID-19 virus RNA test result positive at limit of detection ?1. Take meds as prescribed ?2. Use a cool mist humidifier especially during the winter months and when heat has been humid. ?3. Use saline nose sprays frequently ?4. Saline irrigations of the nose can be very helpful if done  frequently. ? * 4X daily for 1 week* ? * Use of a nettie pot can be helpful with this. Follow directions with this* ?5. Drink plenty of fluids ?6. Keep thermostat turn down low ?7.For any cough or congestion- meds as prescribed ?8. For fever or aces or pains- take tylenol or ibuprofen appropriate for age and weight. ? * for fevers greater than 101 orally you may alternate ibuprofen and tylenol every  3 hours. ?  ?Meds ordered this encounter  ?Medications  ? brompheniramine-pseudoephedrine-DM 30-2-10 MG/5ML syrup  ?  Sig: Take 5 mLs by mouth 4 (four) times daily as needed.  ?  Dispense:  120 mL  ?  Refill:  0  ?  Order Specific Question:   Supervising Provider  ?  Answer:   Eber HongMILLER, BRIAN [[id number]]  ? ? ? ? ?FOllow Up Instructions: ?I discussed the assessment and treatment plan with the patient. The patient was provided an opportunity to ask questions and all were answered. The patient agreed with the plan and demonstrated an understanding of the instructions.  A copy of instructions were sent to the patient via MyChart. ? ?The patient was advised to call back or  seek an in-person evaluation if the symptoms worsen or if the condition fails to improve as anticipated. ? ?Time:  ?I spent 9 minutes with the patient via telehealth technology discussing the above problems/conce

## 2022-01-15 NOTE — Patient Instructions (Signed)
1. Take meds as prescribed 2. Use a cool mist humidifier especially during the winter months and when heat has been humid. 3. Use saline nose sprays frequently 4. Saline irrigations of the nose can be very helpful if done frequently.  * 4X daily for 1 week*  * Use of a nettie pot can be helpful with this. Follow directions with this* 5. Drink plenty of fluids 6. Keep thermostat turn down low 7.For any cough or congestion- meds as prescribed  8. For fever or aces or pains- take tylenol or ibuprofen appropriate for age and weight.  * for fevers greater than 101 orally you may alternate ibuprofen and tylenol every  3 hours.    

## 2022-01-27 ENCOUNTER — Encounter: Payer: Self-pay | Admitting: Neurology

## 2022-01-28 ENCOUNTER — Other Ambulatory Visit: Payer: Self-pay | Admitting: Neurology

## 2022-01-28 DIAGNOSIS — R519 Headache, unspecified: Secondary | ICD-10-CM

## 2022-01-28 MED ORDER — BUTALBITAL-APAP-CAFFEINE 50-325-40 MG PO TABS: ORAL_TABLET | ORAL | 5 refills | Status: DC

## 2022-02-07 ENCOUNTER — Telehealth: Payer: BLUE CROSS/BLUE SHIELD | Admitting: Physician Assistant

## 2022-02-07 DIAGNOSIS — J069 Acute upper respiratory infection, unspecified: Secondary | ICD-10-CM

## 2022-02-07 MED ORDER — PROMETHAZINE-DM 6.25-15 MG/5ML PO SYRP: 5.0000 mL | ORAL_SOLUTION | Freq: Four times a day (QID) | ORAL | 0 refills | Status: DC | PRN

## 2022-02-07 NOTE — Progress Notes (Signed)
Virtual Visit Consent   Sabrina [name], you are scheduled for a virtual visit with a [facility] provider today. Just as with appointments in the office, your consent must be obtained to participate. Your consent will be active for this visit and any virtual visit you may have with one of our providers in the next 365 days. If you have a MyChart account, a copy of this consent can be sent to you electronically.  As this is a virtual visit, video technology does not allow for your provider to perform a traditional examination. This may limit your provider's ability to fully assess your condition. If your provider identifies any concerns that need to be evaluated in person or the need to arrange testing (such as labs, EKG, etc.), we will make arrangements to do so. Although advances in technology are sophisticated, we cannot ensure that it will always work on either your end or our end. If the connection with a video visit is poor, the visit may have to be switched to a telephone visit. With either a video or telephone visit, we are not always able to ensure that we have a secure connection.  By engaging in this virtual visit, you consent to the provision of healthcare and authorize for your insurance to be billed (if applicable) for the services provided during this visit. Depending on your insurance coverage, you may receive a charge related to this service.  I need to obtain your verbal consent now. Are you willing to proceed with your visit today? Sabrina [name] has provided verbal consent on 02/07/2022 for a virtual visit (video or telephone). Mar Daring, PA-C  Date: 02/07/2022 3:14 PM  Virtual Visit via Video Note   I, Mar Daring, connected with  Sabrina [name]  ([id number], January 02, 1966) on 02/07/22 at  3:15 PM EDT by a video-enabled telemedicine application and verified that I am speaking with the correct person using two identifiers.  Location: Patient: Virtual Visit Location  Patient: Home Provider: Virtual Visit Location Provider: Home Office   I discussed the limitations of evaluation and management by telemedicine and the availability of in person appointments. The patient expressed understanding and agreed to proceed.    History of Present Illness: Sabrina [name] is a 56 y.o. who identifies as a female who was assigned female at birth, and is being seen today for recurrent URI symptoms.  HPI: URI  This is a new problem. The current episode started in the past 7 days (Friday night). The problem has been gradually worsening. Maximum temperature: reports low grade 99.6. The fever has been present for Less than 1 day. Associated symptoms include congestion, coughing, headaches, sinus pain, a sore throat and swollen glands. She has tried nothing for the symptoms. The treatment provided no relief.  Has had recurrent issues since January?February when she initially had Covid, then unfortunately got Covid again in May. Has had chronic cough issues up until around 01/25/22 or so, when cough had finally resolved. Then on Friday, 02/04/22, she started developing new URI symptoms again. Has had positive sick exposures at work.   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  Other meniscus derangements, posterior horn of medial meniscus, left knee 02/19/2019   Bucket handle tear of lateral meniscus 02/19/2019   Unilateral primary osteoarthritis, right knee 12/13/2018   Panic disorder 12/13/2018   Chronic headaches 12/13/2018   Weakness 09/06/2017   Depression 09/06/2017   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   Generalized anxiety disorder with panic attacks  09/06/2017   Right hand pain    Wrist swelling    Cellulitis of right upper extremity 04/11/2016   Elbow pain 07/20/2015   Anxiety 07/20/2015   Tachycardia 07/04/2015   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[facility])    Skin ulcer of upper arm, limited to breakdown of skin ([facility]) 07/01/2015    Allergies:  Allergies  Allergen Reactions   Ondansetron Rash and Swelling    Spots around iv site IV zofran   Zofran [Ondansetron Hcl] Hives and Other (See Comments)    Spots around iv site   Bromfed Dm [Pseudoeph-Bromphen-Dm] Other (See Comments)    anxiety   Diclofenac Sodium Rash   Droperidol Palpitations   Flexeril [Cyclobenzaprine] Anxiety   Morphine Itching   Morphine And Related Itching   Tessalon [Benzonatate] Other (See Comments)    Conjunctivitis, watery eyes.   Medications:  Current Outpatient Medications:    promethazine-dextromethorphan (PROMETHAZINE-DM) 6.25-15 MG/5ML syrup, Take 5 mLs by mouth 4 (four) times daily as needed., Disp: 240 mL, Rfl: 0   albuterol (VENTOLIN HFA) 108 (90 Base) MCG/ACT inhaler, Inhale 2 puffs into the lungs every 6 (six) hours as needed for wheezing or shortness of breath (Cough)., Disp: 18 g, Rfl: 1   atorvastatin (LIPITOR) 20 MG tablet, TAKE 1 TABLET BY MOUTH ONCE A DAY, Disp: 90 tablet, Rfl: 1   brompheniramine-pseudoephedrine-DM 30-2-10 MG/5ML syrup, Take 5 mLs by mouth 4 (four) times daily as needed., Disp: 120 mL, Rfl: 0   butalbital-acetaminophen-caffeine (FIORICET) 50-325-40 MG tablet, TAKE ONE TABLET BY MOUTH EVERY 6 HOURS AS NEEDED FOR HEADACHE, Disp: 10 tablet, Rfl: 5   diazepam (VALIUM) 10 MG tablet, Take one tablet (10 mg total) by mouth three times daily., Disp: 90 tablet, Rfl: 4   gabapentin (NEURONTIN) 300 MG capsule, Take 2 capsules (600 mg total) by mouth 3 (three) times daily., Disp: 540 capsule, Rfl: 1   SUMAtriptan (IMITREX) 50 MG tablet, Take 1 tablet at onset of headache with 400 mg of ibuprofen and a spoonful of peanut butter.  May take second  sumatriptan tablet in 2 hours if headache persists., Disp: 10 tablet, Rfl: 0   venlafaxine XR (EFFEXOR-XR) 150 MG 24 hr capsule, Take 1 capsule (150 mg total) by mouth every morning., Disp: 90 capsule, Rfl: 1  Observations/Objective: Patient is well-developed, well-nourished in no acute distress.  Resting comfortably at home.  Head is normocephalic, atraumatic.  No labored breathing.  Speech is clear and coherent with logical content.  Patient is alert and oriented at baseline.  Dry, hacking cough heard a few times.   Assessment and Plan: 1. Viral URI with cough - promethazine-dextromethorphan (PROMETHAZINE-DM) 6.25-15 MG/5ML syrup; Take 5 mLs by mouth 4 (four) times daily as needed.  Dispense: 240 mL; Refill: 0  - Worsening despite OTC medications - Will treat with Promethazine DM  - Has a medrol dose pack at home she will start in the morning - Can continue Mucinex  - Push fluids.  - Rest.  - Steam and humidifier can help - Seek in person evaluation if worsening or symptoms fail to improve    Follow Up Instructions: I discussed the assessment and  treatment plan with the patient. The patient was provided an opportunity to ask questions and all were answered. The patient agreed with the plan and demonstrated an understanding of the instructions.  A copy of instructions were sent to the patient via MyChart unless otherwise noted below.    The patient was advised to call back or seek an in-person evaluation if the symptoms worsen or if the condition fails to improve as anticipated.  Time:  I spent 13 minutes with the patient via telehealth technology discussing the above problems/concerns.    Mar Daring, PA-C

## 2022-02-07 NOTE — Patient Instructions (Signed)
Sabrina [name], thank you for joining Margaretann Loveless, PA-C for today's virtual visit.  While this provider is not your primary care provider (PCP), if your PCP is located in our provider database this encounter information will be shared with them immediately following your visit.  Consent: (Patient) Sabrina [name] provided verbal consent for this virtual visit at the beginning of the encounter.  Current Medications:  Current Outpatient Medications:    promethazine-dextromethorphan (PROMETHAZINE-DM) 6.25-15 MG/5ML syrup, Take 5 mLs by mouth 4 (four) times daily as needed., Disp: 240 mL, Rfl: 0   albuterol (VENTOLIN HFA) 108 (90 Base) MCG/ACT inhaler, Inhale 2 puffs into the lungs every 6 (six) hours as needed for wheezing or shortness of breath (Cough)., Disp: 18 g, Rfl: 1   atorvastatin (LIPITOR) 20 MG tablet, TAKE 1 TABLET BY MOUTH ONCE A DAY, Disp: 90 tablet, Rfl: 1   brompheniramine-pseudoephedrine-DM 30-2-10 MG/5ML syrup, Take 5 mLs by mouth 4 (four) times daily as needed., Disp: 120 mL, Rfl: 0   butalbital-acetaminophen-caffeine (FIORICET) 50-325-40 MG tablet, TAKE ONE TABLET BY MOUTH EVERY 6 HOURS AS NEEDED FOR HEADACHE, Disp: 10 tablet, Rfl: 5   diazepam (VALIUM) 10 MG tablet, Take one tablet (10 mg total) by mouth three times daily., Disp: 90 tablet, Rfl: 4   gabapentin (NEURONTIN) 300 MG capsule, Take 2 capsules (600 mg total) by mouth 3 (three) times daily., Disp: 540 capsule, Rfl: 1   SUMAtriptan (IMITREX) 50 MG tablet, Take 1 tablet at onset of headache with 400 mg of ibuprofen and a spoonful of peanut butter.  May take second sumatriptan tablet in 2 hours if headache persists., Disp: 10 tablet, Rfl: 0   venlafaxine XR (EFFEXOR-XR) 150 MG 24 hr capsule, Take 1 capsule (150 mg total) by mouth every morning., Disp: 90 capsule, Rfl: 1   Medications ordered in this encounter:  Meds ordered this encounter  Medications   promethazine-dextromethorphan (PROMETHAZINE-DM) 6.25-15 MG/5ML  syrup    Sig: Take 5 mLs by mouth 4 (four) times daily as needed.    Dispense:  240 mL    Refill:  0    Order Specific Question:   Supervising Provider    Answer:   Hyacinth Meeker, BRIAN [[id number]]     *If you need refills on other medications prior to your next appointment, please contact your pharmacy*  Follow-Up: Call back or seek an in-person evaluation if the symptoms worsen or if the condition fails to improve as anticipated.  Other Instructions Upper Respiratory Infection, Adult An upper respiratory infection (URI) affects the nose, throat, and upper airways that lead to the lungs. The most common type of URI is often called the common cold. URIs usually get better on their own, without medical treatment. What are the causes? A URI is caused by a germ (virus). You may catch these germs by: Breathing in droplets from an infected person's cough or sneeze. Touching something that has the germ on it (is contaminated) and then touching your mouth, nose, or eyes. What increases the risk? You are more likely to get a URI if: You are very young or very old. You have close contact with others, such as at work, school, or a health care facility. You smoke. You have long-term (chronic) heart or lung disease. You have a weakened disease-fighting system (immune system). You have nasal allergies or asthma. You have a lot of stress. You have poor nutrition. What are the signs or symptoms? Runny or stuffy (congested) nose. Cough. Sneezing. Sore throat. Headache.  Feeling tired (fatigue). Fever. Not wanting to eat as much as usual. Pain in your forehead, behind your eyes, and over your cheekbones (sinus pain). Muscle aches. Redness or irritation of the eyes. Pressure in the ears or face. How is this treated? URIs usually get better on their own within 7-10 days. Medicines cannot cure URIs, but your doctor may recommend certain medicines to help relieve symptoms, such as: Over-the-counter cold  medicines. Medicines to reduce coughing (cough suppressants). Coughing is a type of defense against infection that helps to clear the nose, throat, windpipe, and lungs (respiratory system). Take these medicines only as told by your doctor. Medicines to lower your fever. Follow these instructions at home: Activity Rest as needed. If you have a fever, stay home from work or school until your fever is gone, or until your doctor says you may return to work or school. You should stay home until you cannot spread the infection anymore (you are not contagious). Your doctor may have you wear a face mask so you have less risk of spreading the infection. Relieving symptoms Rinse your mouth often with salt water. To make salt water, dissolve -1 tsp (3-6 g) of salt in 1 cup (237 mL) of warm water. Use a cool-mist humidifier to add moisture to the air. This can help you breathe more easily. Eating and drinking  Drink enough fluid to keep your pee (urine) pale yellow. Eat soups and other clear broths. General instructions  Take over-the-counter and prescription medicines only as told by your doctor. Do not smoke or use any products that contain nicotine or tobacco. If you need help quitting, ask your doctor. Avoid being where people are smoking (avoid secondhand smoke). Stay up to date on all your shots (immunizations), and get the flu shot every year. Keep all follow-up visits. How to prevent the spread of infection to others  Wash your hands with soap and water for at least 20 seconds. If you cannot use soap and water, use hand sanitizer. Avoid touching your mouth, face, eyes, or nose. Cough or sneeze into a tissue or your sleeve or elbow. Do not cough or sneeze into your hand or into the air. Contact a doctor if: You are getting worse, not better. You have any of these: A fever or chills. Brown or red mucus in your nose. Yellow or brown fluid (discharge)coming from your nose. Pain in your  face, especially when you bend forward. Swollen neck glands. Pain when you swallow. White areas in the back of your throat. Get help right away if: You have shortness of breath that gets worse. You have very bad or constant: Headache. Ear pain. Pain in your forehead, behind your eyes, and over your cheekbones (sinus pain). Chest pain. You have long-lasting (chronic) lung disease along with any of these: Making high-pitched whistling sounds when you breathe, most often when you breathe out (wheezing). Long-lasting cough (more than 14 days). Coughing up blood. A change in your usual mucus. You have a stiff neck. You have changes in your: Vision. Hearing. Thinking. Mood. These symptoms may be an emergency. Get help right away. Call 911. Do not wait to see if the symptoms will go away. Do not drive yourself to the hospital. Summary An upper respiratory infection (URI) is caused by a germ (virus). The most common type of URI is often called the common cold. URIs usually get better within 7-10 days. Take over-the-counter and prescription medicines only as told by your doctor. This information is  not intended to replace advice given to you by your health care provider. Make sure you discuss any questions you have with your health care provider. Document Revised: 03/31/2021 Document Reviewed: 03/31/2021 Elsevier Patient Education  2023 Elsevier Inc.    If you have been instructed to have an in-person evaluation today at a local Urgent Care facility, please use the link below. It will take you to a list of all of our available [facility], including address, phone number and hours of operation. Please do not delay care.  [facility]  If you or a family member do not have a primary care provider, use the link below to schedule a visit and establish care. When you choose a [facility] primary care physician or advanced practice provider, you gain a long-term  partner in health. Find a Primary Care Provider  Learn more about [facility]'s in-office and virtual care options: [facility] - Get Care Now

## 2022-03-12 ENCOUNTER — Telehealth: Payer: BLUE CROSS/BLUE SHIELD

## 2022-03-23 ENCOUNTER — Encounter: Payer: Self-pay | Admitting: Neurology

## 2022-03-23 ENCOUNTER — Other Ambulatory Visit: Payer: Self-pay | Admitting: Neurology

## 2022-03-23 DIAGNOSIS — G8929 Other chronic pain: Secondary | ICD-10-CM

## 2022-03-23 MED ORDER — BUTALBITAL-APAP-CAFFEINE 50-325-40 MG PO TABS: ORAL_TABLET | ORAL | 2 refills | Status: DC

## 2022-04-09 ENCOUNTER — Telehealth: Payer: BLUE CROSS/BLUE SHIELD | Admitting: Urgent Care

## 2022-04-09 DIAGNOSIS — R053 Chronic cough: Secondary | ICD-10-CM | POA: Diagnosis not present

## 2022-04-09 DIAGNOSIS — R052 Subacute cough: Secondary | ICD-10-CM

## 2022-04-09 DIAGNOSIS — U099 Post covid-19 condition, unspecified: Secondary | ICD-10-CM

## 2022-04-09 MED ORDER — PROMETHAZINE-DM 6.25-15 MG/5ML PO SYRP: 5.0000 mL | ORAL_SOLUTION | Freq: Four times a day (QID) | ORAL | 0 refills | Status: DC | PRN

## 2022-04-09 NOTE — Progress Notes (Signed)
Virtual Visit Consent   [name], you are scheduled for a virtual visit with a [facility] provider today. Just as with appointments in the office, your consent must be obtained to participate. Your consent will be active for this visit and any virtual visit you may have with one of our providers in the next 365 days. If you have a MyChart account, a copy of this consent can be sent to you electronically.  As this is a virtual visit, video technology does not allow for your provider to perform a traditional examination. This may limit your provider's ability to fully assess your condition. If your provider identifies any concerns that need to be evaluated in person or the need to arrange testing (such as labs, EKG, etc.), we will make arrangements to do so. Although advances in technology are sophisticated, we cannot ensure that it will always work on either your end or our end. If the connection with a video visit is poor, the visit may have to be switched to a telephone visit. With either a video or telephone visit, we are not always able to ensure that we have a secure connection.  By engaging in this virtual visit, you consent to the provision of healthcare and authorize for your insurance to be billed (if applicable) for the services provided during this visit. Depending on your insurance coverage, you may receive a charge related to this service.  I need to obtain your verbal consent now. Are you willing to proceed with your visit today? Sabrina [name] has provided verbal consent on 04/09/2022 for a virtual visit (video or telephone). Sabrina Bees, PA  Date: 04/09/2022 8:43 AM  Virtual Visit via Video Note   I, Sabrina [name], connected with  Sabrina [name]  ([id number], 07/06/1966) on 04/09/22 at  8:30 AM EDT by a video-enabled telemedicine application and verified that I am speaking with the correct person using two identifiers.  Location: Patient: Virtual Visit Location Patient:  Home Provider: Virtual Visit Location Provider: Home Office   I discussed the limitations of evaluation and management by telemedicine and the availability of in person appointments. The patient expressed understanding and agreed to proceed.    History of Present Illness: Sabrina [name] is a 56 y.o. who identifies as a female who was assigned female at birth, and is being seen today for cough.  HPI: Pleasant 56yo female presents today primarily requesting medication refill. She has had covid 3x/ this year, as well as in prior years. 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 Was seen by a pulmonologist and had a complete workup, was dx with long term complication from prior covid infections. She does have both an albuterol and steroid inhaler, but feels this does not help with the cough. 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 Has been having a worsening dry cough over the past several days. Has done very well with promethazine-DM cough syrup. Reports other cough medications are not helpful, and cannot tolerate bromfed or tessalon. She denies mucous or a productive cough, fever, SOB, DOE, tightness of chest, CP, palps, or other URI sx.   Problems:  Patient Active Problem List   Diagnosis Date Noted   Primary osteoarthritis of left knee 06/16/2020   Effusion, left knee 06/16/2020   Loose body in knee, left knee 06/16/2020   Vitamin D deficiency 03/24/2020   Hyperlipidemia 03/24/2020   Pain  in left foot 01/21/2020   Primary osteoarthritis of right knee 08/06/2019   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   Other meniscus derangements, posterior horn of medial meniscus, left knee 02/19/2019   Bucket handle tear of lateral meniscus 02/19/2019   Unilateral primary osteoarthritis, right knee 12/13/2018   Panic disorder 12/13/2018   Chronic headaches 12/13/2018   Weakness 09/06/2017   Depression 09/06/2017   Bullous  pemphigoid 09/06/2017   Generalized anxiety disorder with panic attacks 09/06/2017   Right hand pain    Wrist swelling    Cellulitis of right upper extremity 04/11/2016   Elbow pain 07/20/2015   Anxiety 07/20/2015   Tachycardia 07/04/2015   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[facility])    Skin ulcer of upper arm, limited to breakdown of skin ([facility]) 07/01/2015    Allergies:  Allergies  Allergen Reactions   Ondansetron Rash and Swelling    Spots around iv site IV zofran   Zofran [Ondansetron Hcl] Hives and Other (See Comments)    Spots around iv site   Bromfed Dm [Pseudoeph-Bromphen-Dm] Other (See Comments)    anxiety   Diclofenac Sodium Rash   Droperidol Palpitations   Flexeril [Cyclobenzaprine] Anxiety   Morphine Itching   Morphine And Related Itching   Tessalon [Benzonatate] Other (See Comments)    Conjunctivitis, watery eyes.   Medications:  Current Outpatient Medications:    promethazine-dextromethorphan (PROMETHAZINE-DM) 6.25-15 MG/5ML syrup, Take 5 mLs by mouth 4 (four) times daily as needed for cough., Disp: 180 mL, Rfl: 0   albuterol (VENTOLIN HFA) 108 (90 Base) MCG/ACT inhaler, Inhale 2 puffs into the lungs every 6 (six) hours as needed for wheezing or shortness of breath (Cough)., Disp: 18 g, Rfl: 1   atorvastatin (LIPITOR) 20 MG tablet, TAKE 1 TABLET BY MOUTH ONCE A DAY, Disp: 90 tablet, Rfl: 1   butalbital-acetaminophen-caffeine (FIORICET) 50-325-40 MG tablet, TAKE ONE TABLET BY MOUTH EVERY 6 HOURS AS NEEDED FOR HEADACHE, Disp: 10 tablet, Rfl: 2   diazepam (VALIUM) 10 MG tablet, Take one tablet (10 mg total) by mouth three times daily., Disp: 90 tablet, Rfl: 4   gabapentin (NEURONTIN) 300 MG capsule, Take 2 capsules (600 mg total) by mouth 3 (three) times daily., Disp: 540 capsule, Rfl: 1   SUMAtriptan (IMITREX) 50 MG tablet, Take 1 tablet at onset of headache with 400 mg of ibuprofen and a spoonful of peanut butter.  May take second sumatriptan tablet in 2 hours if headache persists.,  Disp: 10 tablet, Rfl: 0   venlafaxine XR (EFFEXOR-XR) 150 MG 24 hr capsule, Take 1 capsule (150 mg total) by mouth every morning., Disp: 90 capsule, Rfl: 1  Observations/Objective: Patient is well-developed, well-nourished in no acute distress.  Resting comfortably on couch at home.  Head is normocephalic, atraumatic.  No labored breathing. Minimal dry coughing heard on exam. Hoarse, raspy voice. Mild anterior rhinorrhea. Speech is clear and coherent with logical content.  Patient is alert and oriented. No scleral injection or icterus No rash   Assessment and Plan: 1. Subacute cough  2. COVID-19 long hauler manifesting chronic cough  Pt with long term, chronic sx that are recurrent. She has had a complete workup with the specialist. She admits her current sx are no worse and unchanged from all prior coughing episodes. She is primarily in need of a refill of promethazine-dm to get her through her CME conference in [place] until she can follow up with PCP upon return home. Pt aware of side effect profile and primarily takes at night only.  Follow Up Instructions: I discussed the assessment and treatment plan with the patient. The patient was provided an opportunity to ask questions and all were answered. The patient agreed with the plan and demonstrated an understanding of the instructions.  A copy of instructions were sent to the patient via MyChart unless otherwise noted below.    The patient was advised to call [name] or seek an in-person evaluation if the symptoms worsen or if the condition fails to improve as anticipated.  Time:  I spent 10 minutes with the patient via telehealth technology discussing the above problems/concerns.    Aldrich Lloyd L Sloan Takagi, PA

## 2022-04-09 NOTE — Patient Instructions (Signed)
  Sabrina [name], thank you for joining Maretta Bees, PA for today's virtual visit.  While this provider is not your primary care provider (PCP), if your PCP is located in our provider database this encounter information will be shared with them immediately following your visit.  Consent: (Patient) Sabrina [name] provided verbal consent for this virtual visit at the beginning of the encounter.  Current Medications:  Current Outpatient Medications:    promethazine-dextromethorphan (PROMETHAZINE-DM) 6.25-15 MG/5ML syrup, Take 5 mLs by mouth 4 (four) times daily as needed for cough., Disp: 180 mL, Rfl: 0   albuterol (VENTOLIN HFA) 108 (90 Base) MCG/ACT inhaler, Inhale 2 puffs into the lungs every 6 (six) hours as needed for wheezing or shortness of breath (Cough)., Disp: 18 g, Rfl: 1   atorvastatin (LIPITOR) 20 MG tablet, TAKE 1 TABLET BY MOUTH ONCE A DAY, Disp: 90 tablet, Rfl: 1   butalbital-acetaminophen-caffeine (FIORICET) 50-325-40 MG tablet, TAKE ONE TABLET BY MOUTH EVERY 6 HOURS AS NEEDED FOR HEADACHE, Disp: 10 tablet, Rfl: 2   diazepam (VALIUM) 10 MG tablet, Take one tablet (10 mg total) by mouth three times daily., Disp: 90 tablet, Rfl: 4   gabapentin (NEURONTIN) 300 MG capsule, Take 2 capsules (600 mg total) by mouth 3 (three) times daily., Disp: 540 capsule, Rfl: 1   SUMAtriptan (IMITREX) 50 MG tablet, Take 1 tablet at onset of headache with 400 mg of ibuprofen and a spoonful of peanut butter.  May take second sumatriptan tablet in 2 hours if headache persists., Disp: 10 tablet, Rfl: 0   venlafaxine XR (EFFEXOR-XR) 150 MG 24 hr capsule, Take 1 capsule (150 mg total) by mouth every morning., Disp: 90 capsule, Rfl: 1   Medications ordered in this encounter:  Meds ordered this encounter  Medications   promethazine-dextromethorphan (PROMETHAZINE-DM) 6.25-15 MG/5ML syrup    Sig: Take 5 mLs by mouth 4 (four) times daily as needed for cough.    Dispense:  180 mL    Refill:  0    Order  Specific Question:   Supervising Provider    Answer:   Hyacinth Meeker, BRIAN [[id number]]     *If you need refills on other medications prior to your next appointment, please contact your pharmacy*  Follow-Up: Call back or seek an in-person evaluation if the symptoms worsen or if the condition fails to improve as anticipated.  Other Instructions I have refilled your cough medication as requested. Please follow up with your PCP or pulmonologist upon return home from [place]. If you develop a fever, shortness of breath or tightness in your chest, please be seen in person.   If you have been instructed to have an in-person evaluation today at a local Urgent Care facility, please use the link below. It will take you to a list of all of our available [facility], including address, phone number and hours of operation. Please do not delay care.  [facility]  If you or a family member do not have a primary care provider, use the link below to schedule a visit and establish care. When you choose a [facility] primary care physician or advanced practice provider, you gain a long-term partner in health. Find a Primary Care Provider  Learn more about [facility] in-office and virtual care options: [facility] Get Care Now

## 2022-04-13 ENCOUNTER — Encounter: Payer: Self-pay | Admitting: Neurology

## 2022-04-14 ENCOUNTER — Other Ambulatory Visit: Payer: Self-pay | Admitting: Neurology

## 2022-04-14 DIAGNOSIS — G8929 Other chronic pain: Secondary | ICD-10-CM

## 2022-04-14 MED ORDER — BUTALBITAL-APAP-CAFFEINE 50-325-40 MG PO TABS: ORAL_TABLET | ORAL | 2 refills | Status: DC

## 2022-04-15 ENCOUNTER — Ambulatory Visit
Admission: EM | Admit: 2022-04-15 | Discharge: 2022-04-15 | Disposition: A | Discharge status: Home / Self Care | Payer: BLUE CROSS/BLUE SHIELD | Attending: Urgent Care | Admitting: Urgent Care

## 2022-04-15 ENCOUNTER — Encounter: Payer: Self-pay | Admitting: Emergency Medicine

## 2022-04-15 DIAGNOSIS — R058 Other specified cough: Secondary | ICD-10-CM

## 2022-04-15 DIAGNOSIS — R052 Subacute cough: Secondary | ICD-10-CM

## 2022-04-15 DIAGNOSIS — Z8616 Personal history of COVID-19: Secondary | ICD-10-CM | POA: Diagnosis not present

## 2022-04-15 MED ORDER — PROMETHAZINE-DM 6.25-15 MG/5ML PO SYRP: 5.0000 mL | ORAL_SOLUTION | Freq: Every evening | ORAL | 0 refills | Status: DC | PRN

## 2022-04-15 NOTE — ED Triage Notes (Signed)
Pt here with persistent dry cough that has been ongoing since 9 months. Pt has had COVID 3x. Has had 3 X-Rays that have been negative since then. Pt has flonase, steroid inhaler, albuterol, promethazine cough syrup. Syrup works normally works well, but has stopped working. Has tried OTC meds without relief.

## 2022-04-15 NOTE — ED Provider Notes (Signed)
[facility]   MRN: [id number] DOB: 09/18/1965  Subjective:   Sabrina [name] is a 56 y.o. female presenting for an acute on chronic cough for the past 9 months. Has a history of COVID 19 four times.  Has been told that she may end up having difficulty with her lungs from having COVID so many times.  She has been prescribed Flonase, steroid inhaler and albuterol.  She does not use these medications consistently.  Has used different kinds of cough syrups and promethazine has worked well for her.  She would like a refill of this.  No overt chest pain, shortness of breath or wheezing.  She primarily wanted to make sure that she did not have an infection, bronchitis.  No current facility-administered medications for this encounter.  Current Outpatient Medications:    albuterol (VENTOLIN HFA) 108 (90 Base) MCG/ACT inhaler, Inhale 2 puffs into the lungs every 6 (six) hours as needed for wheezing or shortness of breath (Cough)., Disp: 18 g, Rfl: 1   atorvastatin (LIPITOR) 20 MG tablet, TAKE 1 TABLET BY MOUTH ONCE A DAY, Disp: 90 tablet, Rfl: 1   butalbital-acetaminophen-caffeine (FIORICET) 50-325-40 MG tablet, TAKE ONE TABLET BY MOUTH EVERY 6 HOURS AS NEEDED FOR HEADACHE, Disp: 10 tablet, Rfl: 2   diazepam (VALIUM) 10 MG tablet, Take one tablet (10 mg total) by mouth three times daily., Disp: 90 tablet, Rfl: 4   gabapentin (NEURONTIN) 300 MG capsule, Take 2 capsules (600 mg total) by mouth 3 (three) times daily., Disp: 540 capsule, Rfl: 1   promethazine-dextromethorphan (PROMETHAZINE-DM) 6.25-15 MG/5ML syrup, Take 5 mLs by mouth 4 (four) times daily as needed for cough., Disp: 180 mL, Rfl: 0   SUMAtriptan (IMITREX) 50 MG tablet, Take 1 tablet at onset of headache with 400 mg of ibuprofen and a spoonful of peanut butter.  May take second sumatriptan tablet in 2 hours if headache persists., Disp: 10 tablet, Rfl: 0   venlafaxine XR (EFFEXOR-XR) 150 MG 24 hr capsule, Take 1 capsule  (150 mg total) by mouth every morning., Disp: 90 capsule, Rfl: 1   Allergies  Allergen Reactions   Ondansetron Rash and Swelling    Spots around iv site IV zofran   Zofran [Ondansetron Hcl] Hives and Other (See Comments)    Spots around iv site   Bromfed Dm [Pseudoeph-Bromphen-Dm] Other (See Comments)    anxiety   Diclofenac Sodium Rash   Droperidol Palpitations   Flexeril [Cyclobenzaprine] Anxiety   Morphine Itching   Morphine And Related Itching   Tessalon [Benzonatate] Other (See Comments)    Conjunctivitis, watery eyes.   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HCC)    Poisoning, snake bite 04/08/2016   "[place]; RUE"   PONV (postoperative nausea and vomiting)    S/P right knee surgery    Situational anxiety    Staph infection ~ 2015   "left elbow and finger"    Past Surgical History:  Procedure Laterality Date   APPENDECTOMY  ~ 1987   APPLICATION OF A-CELL OF EXTREMITY Left 08/05/2015   Procedure: APPLICATION OF A-CELL OF EXTREMITY;  Surgeon: Alena Bills Dillingham, DO;  Location: [facility];  Service: Plastics;  Laterality: Left;   BREAST SURGERY Right 1990   "milk duct taken out"   CHONDROPLASTY Right 02/19/2019   Procedure: CHONDROPLASTY; EXCISION EXOSTOSIS;  Surgeon: Valeria Batman, MD;  Location: [facility];  Service: Orthopedics;  Laterality: Right;   DEBRIDEMENT AND CLOSURE WOUND Left 07/01/2015   Procedure: LEFT ELBOW EXCISION OF WOUND WITH PRIMARY CLOSURE 2X5 CM ;  Surgeon: Peggye Form, DO;  Location: [facility];  Service: Plastics;  Laterality: Left;   ELBOW SURGERY Left X 23 in [place] <06/2015   from a cat bite; all I&D    I & D EXTREMITY Left 07/08/2015   Procedure: IRRIGATION AND DEBRIDEMENT EXTREMITY, DRAINAGE OF LEFT ARM WOUND, A-CELL PLACEMENT, WOUND VAC PLACEMENT;  Surgeon: Alena Bills Dillingham, DO;  Location: [facility];  Service: Plastics;  Laterality: Left;   INCISION AND DRAINAGE OF WOUND Left 08/05/2015   Procedure: IRRIGATION AND DEBRIDEMENT LEFT ELBOW WOUND, PLACEMENT OF ACELL;  Surgeon: Peggye Form, DO;  Location: [facility];  Service: Plastics;  Laterality: Left;   KNEE ARTHROSCOPY WITH MEDIAL MENISECTOMY Right 02/19/2019   Procedure: RIGHT KNEE ARTHROSCOPY, DEBRIDEMENT, PARTIAL MEDIAL AND LATERAL MENISECTOMY;  Surgeon: Valeria Batman, MD;  Location: [facility];  Service: Orthopedics;  Laterality: Right;   LAPAROSCOPIC CHOLECYSTECTOMY  1998   SKIN GRAFT Left 2016   took from anterior thigh; placed at elbow   TONSILLECTOMY  ~ 2000   TOTAL ABDOMINAL HYSTERECTOMY  2003   WRIST SURGERY Right 01/2016    Family History  Problem Relation Age of Onset   Liver disease Mother    Dementia Mother    Cirrhosis Mother    Prostate cancer Father    Colon cancer Maternal Grandmother    Ovarian cancer Maternal Aunt     Social History   Tobacco Use   Smoking status: Never   Smokeless tobacco: Never  Vaping Use   Vaping Use: Never used  Substance Use Topics   Alcohol use: Not Currently    Alcohol/week: 0.0 standard drinks of alcohol   Drug use: No    ROS   Objective:   Vitals: BP 94/64   Pulse 88   Temp 98.3 F (36.8 C)   Resp 20   SpO2 95%   Wt Readings from Last 3 Encounters:  07/09/21 170 lb (77.1 kg)  05/26/21 172 lb (78 kg)  01/22/21 178 lb (80.7 kg)   Temp Readings from Last 3 Encounters:  04/15/22 98.3 F (36.8 C)  01/13/22 97.9 F (36.6 C) (Oral)  09/24/21 98.4 F (36.9 C) (Oral)   BP Readings from Last 3 Encounters:  04/15/22 94/64  01/13/22 115/82  09/24/21 101/66   Pulse Readings from Last 3 Encounters:  04/15/22 88   01/13/22 99  09/24/21 (!) 116   Physical Exam Constitutional:      General: She is not in acute distress.    Appearance: Normal appearance. She is well-developed. She is not ill-appearing, toxic-appearing or diaphoretic.  HENT:     Head: Normocephalic and atraumatic.     Nose: Nose normal.     Mouth/Throat:     Mouth: Mucous membranes are moist.  Eyes:     General: No scleral icterus.       Right eye: No discharge.        Left eye: No discharge.     Extraocular Movements: Extraocular movements intact.  Cardiovascular:  Rate and Rhythm: Normal rate and regular rhythm.     Heart sounds: Normal heart sounds. No murmur heard.    No friction rub. No gallop.  Pulmonary:     Effort: Pulmonary effort is normal. No respiratory distress.     Breath sounds: No stridor. No wheezing, rhonchi or rales.  Chest:     Chest wall: No tenderness.  Skin:    General: Skin is warm and dry.  Neurological:     General: No focal deficit present.     Mental Status: She is alert and oriented to person, place, and time.  Psychiatric:        Mood and Affect: Mood normal.        Behavior: Behavior normal.     Assessment and Plan :   PDMP not reviewed this encounter.  1. Recurrent cough   2. Subacute cough   3. History of COVID-19    Deferred imaging given clear cardiopulmonary exam, hemodynamically stable vital signs.  I provided her with more of the promethazine cough syrup.  Recommended follow-up with pulmonology.  We will hold off on prednisone use for now. Counseled patient on potential for adverse effects with medications prescribed/recommended today, ER and return-to-clinic precautions discussed, patient verbalized understanding.    Wallis Bamberg, [place] 04/15/22 1651

## 2022-04-16 ENCOUNTER — Telehealth: Payer: BLUE CROSS/BLUE SHIELD | Admitting: Nurse Practitioner

## 2022-04-16 DIAGNOSIS — R053 Chronic cough: Secondary | ICD-10-CM | POA: Diagnosis not present

## 2022-04-16 DIAGNOSIS — U099 Post covid-19 condition, unspecified: Secondary | ICD-10-CM | POA: Diagnosis not present

## 2022-04-16 MED ORDER — FLUTICASONE FUROATE-VILANTEROL 100-25 MCG/ACT IN AEPB: 1.0000 | INHALATION_SPRAY | Freq: Every day | RESPIRATORY_TRACT | 11 refills | Status: DC

## 2022-04-16 MED ORDER — PROMETHAZINE VC 6.25-5 MG/5ML PO SYRP: 5.0000 mL | ORAL_SOLUTION | ORAL | 0 refills | Status: DC | PRN

## 2022-04-16 NOTE — Patient Instructions (Signed)

## 2022-04-16 NOTE — Progress Notes (Signed)
Virtual Visit Consent   Sabrina [name], you are scheduled for a virtual visit with Mary-Margaret Daphine Deutscher, FNP, a [facility] provider, today.     Just as with appointments in the office, your consent must be obtained to participate.  Your consent will be active for this visit and any virtual visit you may have with one of our providers in the next 365 days.     If you have a MyChart account, a copy of this consent can be sent to you electronically.  All virtual visits are billed to your insurance company just like a traditional visit in the office.    As this is a virtual visit, video technology does not allow for your provider to perform a traditional examination.  This may limit your provider's ability to fully assess your condition.  If your provider identifies any concerns that need to be evaluated in person or the need to arrange testing (such as labs, EKG, etc.), we will make arrangements to do so.     Although advances in technology are sophisticated, we cannot ensure that it will always work on either your end or our end.  If the connection with a video visit is poor, the visit may have to be switched to a telephone visit.  With either a video or telephone visit, we are not always able to ensure that we have a secure connection.     I need to obtain your verbal consent now.   Are you willing to proceed with your visit today? YES   Sabrina [name] has provided verbal consent on 04/16/2022 for a virtual visit (video or telephone).   Mary-Margaret Daphine Deutscher, FNP   Date: 04/16/2022 4:09 PM   Virtual Visit via Video Note   I, Mary-Margaret Daphine Deutscher, connected with Sabrina [name] ([id number], 27-Aug-1966) on 04/16/22 at  4:15 PM EDT by a video-enabled telemedicine application and verified that I am speaking with the correct person using two identifiers.  Location: Patient: Virtual Visit Location Patient: Home Provider: Virtual Visit Location Provider: Mobile   I discussed the limitations of  evaluation and management by telemedicine and the availability of in person appointments. The patient expressed understanding and agreed to proceed.    History of Present Illness: Sabrina [name] is a 56 y.o. who identifies as a female who was assigned female at birth, and is being seen today for cough.  HPI:  She saw pulmonologist a while back and they dx her with post covid cough. She has had covid 3-4 time sin the last year. She has albuterol inhaler but no other  inhaler due to the expense. She uses the albuterol several time a week when working. She has always used promethazine DM to help her sleep at night. That usually works well fo rher. But last night she was up all night despite taking promethazine DM. She has tried steroids in the past but she did not see how they really helped. Patient [facility] and they said lungs are clear.  Cough This is a new problem. The current episode started in the past 7 days. The problem has been waxing and waning. The problem occurs every few minutes. The cough is Productive of sputum. Nothing aggravates the symptoms. She has tried OTC inhaler for the symptoms. The treatment provided mild relief. post covid cough    Review of Systems  Respiratory:  Positive for cough.     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   Other meniscus derangements, posterior horn of medial meniscus, left knee 02/19/2019   Bucket handle tear of lateral meniscus 02/19/2019   Unilateral primary osteoarthritis, right knee 12/13/2018   Panic disorder 12/13/2018   Chronic headaches 12/13/2018   Weakness 09/06/2017   Depression 09/06/2017    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   Generalized anxiety disorder with panic attacks 09/06/2017   Right hand pain    Wrist swelling    Cellulitis of right upper extremity 04/11/2016   Elbow pain 07/20/2015   Anxiety 07/20/2015   Tachycardia 07/04/2015   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HCC)    Skin ulcer of upper arm, limited to breakdown of skin (HCC) 07/01/2015    Allergies:  Allergies  Allergen Reactions   Ondansetron Rash and Swelling    Spots around iv site IV zofran   Zofran [Ondansetron Hcl] Hives and Other (See Comments)    Spots around iv site   Bromfed Dm [Pseudoeph-Bromphen-Dm] Other (See Comments)    anxiety   Diclofenac Sodium Rash   Droperidol Palpitations   Flexeril [Cyclobenzaprine] Anxiety   Morphine Itching   Morphine And Related Itching   Tessalon [Benzonatate] Other (See Comments)    Conjunctivitis, watery eyes.   Medications:  Current Outpatient Medications:    albuterol (VENTOLIN HFA) 108 (90 Base) MCG/ACT inhaler, Inhale 2 puffs into the lungs every 6 (six) hours as needed for wheezing or shortness of breath (Cough)., Disp: 18 g, Rfl: 1   atorvastatin (LIPITOR) 20 MG tablet, TAKE 1 TABLET BY MOUTH ONCE A DAY, Disp: 90 tablet, Rfl: 1   butalbital-acetaminophen-caffeine (FIORICET) 50-325-40 MG tablet, TAKE ONE TABLET BY MOUTH EVERY 6 HOURS AS NEEDED FOR HEADACHE, Disp: 10 tablet, Rfl: 2   diazepam (VALIUM) 10 MG tablet, Take one tablet (10 mg total) by mouth three times daily., Disp: 90 tablet, Rfl: 4   gabapentin (NEURONTIN) 300 MG capsule, Take 2 capsules (600 mg total) by mouth 3 (three) times daily., Disp: 540 capsule, Rfl: 1   promethazine-dextromethorphan (PROMETHAZINE-DM) 6.25-15 MG/5ML syrup, Take 5 mLs by mouth at bedtime as needed for cough., Disp: 180 mL, Rfl: 0   SUMAtriptan (IMITREX) 50 MG tablet, Take 1 tablet at onset of headache with 400 mg of ibuprofen and a spoonful of peanut butter.  May take second sumatriptan tablet in 2 hours if headache persists.,  Disp: 10 tablet, Rfl: 0   venlafaxine XR (EFFEXOR-XR) 150 MG 24 hr capsule, Take 1 capsule (150 mg total) by mouth every morning., Disp: 90 capsule, Rfl: 1  Observations/Objective: Patient is well-developed, well-nourished in no acute distress.  Resting comfortably  at home.  Head is normocephalic, atraumatic.  No labored breathing.  Speech is clear and coherent with logical content.  Patient is alert and oriented at baseline.  Constant cough during visit  Assessment and Plan:  Sabrina [name] in today with chief complaint of Cough   1. Post-COVID chronic cough 1. Take meds as prescribed 2. Use a cool mist humidifier especially during the winter months and when heat has been humid. 3. Use saline nose sprays frequently 4. Saline irrigations of the nose can be very helpful if done frequently.  * 4X daily  for 1 week*  * Use of a nettie pot can be helpful with this. Follow directions with this* 5. Drink plenty of fluids 6. Keep thermostat turn down low 7.For any cough or congestion- promethazine phenylephrine 8. For fever or aces or pains- take tylenol or ibuprofen appropriate for age and weight.  * for fevers greater than 101 orally you may alternate ibuprofen and tylenol every  3 hours.    Meds ordered this encounter  Medications   fluticasone furoate-vilanterol (BREO ELLIPTA) 100-25 MCG/ACT AEPB    Sig: Inhale 1 puff into the lungs daily.    Dispense:  1 each    Refill:  11    Order Specific Question:   Supervising Provider    Answer:   Eber Hong [[id number]]   promethazine-phenylephrine 6.25-5 MG/5ML SYRP    Sig: Take 5 mLs by mouth every 4 (four) hours as needed for congestion.    Dispense:  118 mL    Refill:  0    Order Specific Question:   Supervising Provider    Answer:   Eber Hong [[id number]]      Follow Up Instructions: I discussed the assessment and treatment plan with the patient. The patient was provided an opportunity to ask questions and all were answered. The  patient agreed with the plan and demonstrated an understanding of the instructions.  A copy of instructions were sent to the patient via MyChart.  The patient was advised to call back or seek an in-person evaluation if the symptoms worsen or if the condition fails to improve as anticipated.  Time:  I spent 12 minutes with the patient via telehealth technology discussing the above problems/concerns.    Mary-Margaret Daphine Deutscher, FNP

## 2022-04-17 ENCOUNTER — Telehealth: Payer: BLUE CROSS/BLUE SHIELD | Admitting: Nurse Practitioner

## 2022-04-17 DIAGNOSIS — R053 Chronic cough: Secondary | ICD-10-CM

## 2022-04-17 DIAGNOSIS — U099 Post covid-19 condition, unspecified: Secondary | ICD-10-CM

## 2022-04-17 MED ORDER — PREDNISONE 10 MG PO TABS: ORAL_TABLET | ORAL | 0 refills | Status: DC

## 2022-04-17 NOTE — Progress Notes (Signed)
I have spent 5 minutes in review of e-visit questionnaire, review and updating patient chart, medical decision making and response to patient.  ° °Jed Kutch W Akemi Overholser, NP ° °  °

## 2022-04-17 NOTE — Progress Notes (Signed)
We can try a few days of prednisone. Please continue to take your BREO inhaler daily and back up inhaler albuterol.   Prednisone 10 mg daily for 6 days (see taper instructions below)  Directions for 6 day taper: Day 1: 2 tablets before breakfast, 1 after both lunch & dinner and 2 at bedtime Day 2: 1 tab before breakfast, 1 after both lunch & dinner and 2 at bedtime Day 3: 1 tab at each meal & 1 at bedtime Day 4: 1 tab at breakfast, 1 at lunch, 1 at bedtime Day 5: 1 tab at breakfast & 1 tab at bedtime Day 6: 1 tab at breakfast  From your responses in the eVisit questionnaire you describe inflammation in the upper respiratory tract which is causing a significant cough.  This is commonly called Bronchitis and has four common causes:   Allergies Viral Infections Acid Reflux Bacterial Infection Allergies, viruses and acid reflux are treated by controlling symptoms or eliminating the cause. An example might be a cough caused by taking certain blood pressure medications. You stop the cough by changing the medication. Another example might be a cough caused by acid reflux. Controlling the reflux helps control the cough.  USE OF BRONCHODILATOR ("RESCUE") INHALERS: There is a risk from using your bronchodilator too frequently.  The risk is that over-reliance on a medication which only relaxes the muscles surrounding the breathing tubes can reduce the effectiveness of medications prescribed to reduce swelling and congestion of the tubes themselves.  Although you feel brief relief from the bronchodilator inhaler, your asthma may actually be worsening with the tubes becoming more swollen and filled with mucus.  This can delay other crucial treatments, such as oral steroid medications. If you need to use a bronchodilator inhaler daily, several times per day, you should discuss this with your provider.  There are probably better treatments that could be used to keep your asthma under control.     HOME  CARE Only take medications as instructed by your medical team. Complete the entire course of an antibiotic. Drink plenty of fluids and get plenty of rest. Avoid close contacts especially the very young and the elderly Cover your mouth if you cough or cough into your sleeve. Always remember to wash your hands A steam or ultrasonic humidifier can help congestion.   GET HELP RIGHT AWAY IF: You develop worsening fever. You become short of breath You cough up blood. Your symptoms persist after you have completed your treatment plan MAKE SURE YOU  Understand these instructions. Will watch your condition. Will get help right away if you are not doing well or get worse.    Thank you for choosing an e-visit.  Your e-visit answers were reviewed by a board certified advanced clinical practitioner to complete your personal care plan. Depending upon the condition, your plan could have included both over the counter or prescription medications.  Please review your pharmacy choice. Make sure the pharmacy is open so you can pick up prescription now. If there is a problem, you may contact your provider through [employer] and have the prescription routed to another pharmacy.  Your safety is important to [place]. If you have drug allergies check your prescription carefully.   For the next 24 hours you can use MyChart to ask questions about today's visit, request a non-urgent call back, or ask for a work or school excuse. You will get an email in the next two days asking about your experience. I hope that your  e-visit has been valuable and will speed your recovery.

## 2022-04-18 MED ORDER — BREO ELLIPTA 100-25 MCG/ACT IN AEPB: 1.0000 | INHALATION_SPRAY | Freq: Every day | RESPIRATORY_TRACT | 0 refills | Status: DC

## 2022-04-18 NOTE — Addendum Note (Signed)
Addended by: Bennie Pierini on: 04/18/2022 11:02 AM   Modules accepted: Orders

## 2022-04-27 ENCOUNTER — Telehealth (HOSPITAL_BASED_OUTPATIENT_CLINIC_OR_DEPARTMENT_OTHER): Payer: BLUE CROSS/BLUE SHIELD | Admitting: Psychiatry

## 2022-04-27 DIAGNOSIS — F3342 Major depressive disorder, recurrent, in full remission: Secondary | ICD-10-CM

## 2022-04-27 MED ORDER — DIAZEPAM 10 MG PO TABS: ORAL_TABLET | ORAL | 4 refills | Status: DC

## 2022-04-27 MED ORDER — VENLAFAXINE HCL ER 150 MG PO CP24: 150.0000 mg | ORAL_CAPSULE | Freq: Every morning | ORAL | 1 refills | Status: DC
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
## 2022-05-02 ENCOUNTER — Other Ambulatory Visit: Payer: Self-pay | Admitting: Neurology

## 2022-05-02 ENCOUNTER — Encounter: Payer: Self-pay | Admitting: Neurology

## 2022-05-02 DIAGNOSIS — R519 Headache, unspecified: Secondary | ICD-10-CM

## 2022-05-02 MED ORDER — BUTALBITAL-APAP-CAFFEINE 50-325-40 MG PO TABS: ORAL_TABLET | ORAL | 2 refills | Status: DC

## 2022-05-03 ENCOUNTER — Telehealth: Payer: BLUE CROSS/BLUE SHIELD | Admitting: Physician Assistant

## 2022-05-03 ENCOUNTER — Telehealth: Payer: BLUE CROSS/BLUE SHIELD

## 2022-05-03 DIAGNOSIS — R053 Chronic cough: Secondary | ICD-10-CM | POA: Diagnosis not present

## 2022-05-03 NOTE — Patient Instructions (Signed)
  Sabrina [name], thank you for joining [name], PA-C for today's virtual visit.  While this provider is not your primary care provider (PCP), if your PCP is located in our provider database this encounter information will be shared with them immediately following your visit.  Consent: (Patient) Sabrina [name] provided verbal consent for this virtual visit at the beginning of the encounter.  Current Medications:  Current Outpatient Medications:    albuterol (VENTOLIN HFA) 108 (90 Base) MCG/ACT inhaler, Inhale 2 puffs into the lungs every 6 (six) hours as needed for wheezing or shortness of breath (Cough)., Disp: 18 g, Rfl: 1   atorvastatin (LIPITOR) 20 MG tablet, TAKE 1 TABLET BY MOUTH ONCE A DAY, Disp: 90 tablet, Rfl: 1   BREO ELLIPTA 100-25 MCG/ACT AEPB, Inhale 1 puff into the lungs daily., Disp: 60 each, Rfl: 0   butalbital-acetaminophen-caffeine (FIORICET) 50-325-40 MG tablet, TAKE ONE TABLET BY MOUTH EVERY 6 HOURS AS NEEDED FOR HEADACHE, Disp: 10 tablet, Rfl: 2   diazepam (VALIUM) 10 MG tablet, Take one tablet (10 mg total) by mouth three times daily., Disp: 90 tablet, Rfl: 4   gabapentin (NEURONTIN) 300 MG capsule, Take 2 capsules (600 mg total) by mouth 3 (three) times daily., Disp: 540 capsule, Rfl: 1   SUMAtriptan (IMITREX) 50 MG tablet, Take 1 tablet at onset of headache with 400 mg of ibuprofen and a spoonful of peanut butter.  May take second sumatriptan tablet in 2 hours if headache persists., Disp: 10 tablet, Rfl: 0   venlafaxine XR (EFFEXOR-XR) 150 MG 24 hr capsule, Take 1 capsule (150 mg total) by mouth every morning., Disp: 90 capsule, Rfl: 1   Medications ordered in this encounter:  No orders of the defined types were placed in this encounter.    *If you need refills on other medications prior to your next appointment, please contact your pharmacy*  Follow-Up: Call back or seek an in-person evaluation if the symptoms worsen or if the condition fails to improve as  anticipated.  Other Instructions Try to send a message to your primary care provider to see about getting you in sooner. In the meantime I recommend you use the link below to be evaluated at one of our in person urgent cares for further evaluation of this ongoing cough.   If you have been instructed to have an in-person evaluation today at a local Urgent Care facility, please use the link below. It will take you to a list of all of our available [facility], including address, phone number and hours of operation. Please do not delay care.  [facility]  If you or a family member do not have a primary care provider, use the link below to schedule a visit and establish care. When you choose a [facility] primary care physician or advanced practice provider, you gain a long-term partner in health. Find a Primary Care Provider  Learn more about [facility]'s in-office and virtual care options: [facility] - Get Care Now

## 2022-05-03 NOTE — Progress Notes (Signed)
Virtual Visit Consent   Sabrina [name], you are scheduled for a virtual visit with a [facility] provider today. Just as with appointments in the office, your consent must be obtained to participate. Your consent will be active for this visit and any virtual visit you may have with one of our providers in the next 365 days. If you have a MyChart account, a copy of this consent can be sent to you electronically.  As this is a virtual visit, video technology does not allow for your provider to perform a traditional examination. This may limit your provider's ability to fully assess your condition. If your provider identifies any concerns that need to be evaluated in person or the need to arrange testing (such as labs, EKG, etc.), we will make arrangements to do so. Although advances in technology are sophisticated, we cannot ensure that it will always work on either your end or our end. If the connection with a video visit is poor, the visit may have to be switched to a telephone visit. With either a video or telephone visit, we are not always able to ensure that we have a secure connection.  By engaging in this virtual visit, you consent to the provision of healthcare and authorize for your insurance to be billed (if applicable) for the services provided during this visit. Depending on your insurance coverage, you may receive a charge related to this service.  I need to obtain your verbal consent now. Are you willing to proceed with your visit today? Sabrina [name] has provided verbal consent on 05/03/2022 for a virtual visit (video or telephone). [name], [place]  Date: 05/03/2022 7:47 PM  Virtual Visit via Video Note   I, [name], connected with  Sabrina [name]  ([id number], 07/27/1966) on 05/03/22 at  5:15 PM EDT by a video-enabled telemedicine application and verified that I am speaking with the correct person using two identifiers.  Location: Patient: Virtual Visit Location  Patient: Home Provider: Virtual Visit Location Provider: Home Office   I discussed the limitations of evaluation and management by telemedicine and the availability of in person appointments. The patient expressed understanding and agreed to proceed.    History of Present Illness: Sabrina [name] is a 56 y.o. who identifies as a female who was assigned female at birth, and is being seen today for ongoing cough, being more consistent over the past 3 days.  Patient endorses a history of chronic cough after COVID infection.  Initially having issues after his significant course of illness in 2020.  Followed by 3 other endorsed bouts of COVID.  Has been followed by her previous primary care provider, pulmonology and current primary care provider.  Notes she has been doing better but has had an increase in cough over the past few days.  Notes cough is dry but is persistent and worse at nighttime.  This is despite use of multiple OTC cough suppressants, use of her albuterol and Breo inhalers.  She states she was given a cough suppressant with promethazine and a decongestant and it at the beginning of the month that she did not tolerate well.  States that promethazine-dextromethorphan works best for her.  States she has not been able to follow-up with her primary care provider regarding these ongoing symptoms.  Notes she is allergic to Tessalon and cannot take that.  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   Other meniscus derangements, posterior horn of medial meniscus, left knee 02/19/2019   Bucket handle tear of lateral meniscus  02/19/2019   Unilateral primary osteoarthritis, right knee 12/13/2018   Panic disorder 12/13/2018   Chronic headaches 12/13/2018   Weakness 09/06/2017   Depression 09/06/2017   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   Generalized anxiety disorder with panic attacks 09/06/2017   Right hand pain    Wrist swelling    Cellulitis of right upper extremity 04/11/2016   Elbow pain 07/20/2015   Anxiety 07/20/2015   Tachycardia 07/04/2015   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HCC)    Skin ulcer of upper arm, limited to breakdown of skin (HCC) 07/01/2015    Allergies:  Allergies  Allergen Reactions   Ondansetron Rash and Swelling    Spots around iv site IV zofran   Zofran [Ondansetron Hcl] Hives and Other (See Comments)    Spots around iv site   Bromfed Dm [Pseudoeph-Bromphen-Dm] Other (See Comments)    anxiety   Diclofenac Sodium Rash   Droperidol Palpitations   Flexeril [Cyclobenzaprine] Anxiety   Morphine Itching   Morphine And Related Itching   Tessalon [Benzonatate] Other (See Comments)    Conjunctivitis, watery eyes.   Medications:  Current Outpatient Medications:    albuterol (VENTOLIN HFA) 108 (90 Base) MCG/ACT inhaler, Inhale 2 puffs into the lungs every 6 (six) hours as needed for wheezing or shortness of breath (Cough)., Disp: 18 g, Rfl: 1   atorvastatin (LIPITOR) 20 MG tablet, TAKE 1 TABLET BY MOUTH ONCE A DAY, Disp: 90 tablet, Rfl: 1   BREO ELLIPTA 100-25 MCG/ACT AEPB, Inhale 1 puff into the lungs daily., Disp: 60 each, Rfl: 0   butalbital-acetaminophen-caffeine (FIORICET) 50-325-40 MG tablet, TAKE ONE TABLET BY MOUTH EVERY 6 HOURS AS NEEDED FOR HEADACHE, Disp: 10 tablet, Rfl: 2   diazepam (VALIUM) 10 MG tablet, Take one tablet (10 mg total) by mouth three times daily., Disp: 90 tablet, Rfl: 4   gabapentin (NEURONTIN) 300 MG capsule, Take 2 capsules (600 mg total) by mouth 3 (three) times daily., Disp: 540 capsule, Rfl: 1   SUMAtriptan (IMITREX) 50 MG tablet, Take 1 tablet at onset of headache  with 400 mg of ibuprofen and a spoonful of peanut butter.  May take second sumatriptan tablet in 2 hours if headache persists., Disp: 10 tablet, Rfl: 0   venlafaxine XR (EFFEXOR-XR) 150 MG 24 hr capsule, Take 1 capsule (150 mg total) by mouth every morning., Disp: 90 capsule, Rfl: 1  Observations/Objective: Patient is well-developed, well-nourished in no acute distress.  Resting comfortably at home.  Head is normocephalic, atraumatic.  No labored breathing. Speech is clear and coherent with logical content.  Patient is alert and oriented at baseline.   Assessment and Plan: 1. Chronic cough  Ongoing issue.  Has been present for greater than 6 months.  No current signs or symptoms to indicate an acute illness on top of her chronic, ongoing issue.  Is no longer followed by pulmonology.  On EMR review she has had numerous visits with our department, other providers within our health system, and multiple surrounding health systems over the past couple months, including multiple visits to  different health systems on the same day, obtaining prescription for promethazine cough syrup.  As an example she was given promethazine-phenylephrine on 04/16/2022, 180 mL.  Was seen by another provider the next day and given a prescription for promethazine-dextromethorphan 180 mL.  She also has numerous FYI's in her chart regarding drug-seeking behavior, including concern from her most recent primary care provider about drug-seeking for promethazine.  As such, and giving this is an ongoing cough and not an acute issue, she will need a in person follow-up with her primary care provider or pulmonologist.  We will not be able to provide any further scripts for promethazine or any controlled substance containing cough medications.  She voices understanding but is very upset about not being given anything today for her cough.  Discussed her other options if her PCP cannot see her today including in person urgent care or if she  is truly worried about her ongoing cough.    Follow Up Instructions: I discussed the assessment and treatment plan with the patient. The patient was provided an opportunity to ask questions and all were answered. The patient agreed with the plan and demonstrated an understanding of the instructions.  A copy of instructions were sent to the patient via MyChart unless otherwise noted below.   The patient was advised to call back or seek an in-person evaluation if the symptoms worsen or if the condition fails to improve as anticipated.  Time:  I spent 30 minutes with the patient via telehealth technology discussing the above problems/concerns.    [name], PA-C

## 2022-05-17 ENCOUNTER — Telehealth: Payer: Self-pay | Admitting: Neurology

## 2022-05-17 ENCOUNTER — Other Ambulatory Visit: Payer: Self-pay | Admitting: Neurology

## 2022-05-17 NOTE — Telephone Encounter (Signed)
Patient called to say her current stress is triggering her migraines. She is having knee replacement surgery 07/19/22.  1. Which medications need refilled? (List name and dosage, if known) Fioricet   2. Which pharmacy/location is medication to be sent to? (include street and city if local pharmacy) [facility] [address]

## 2022-05-17 NOTE — Telephone Encounter (Signed)
LMOVm for patient to call back.  I already sent in a request for early refill.  I will not be able to refill at this time.

## 2022-05-17 NOTE — Telephone Encounter (Signed)
Patient adviesd of Dr.Jaffe note, I already sent in a request for early refill.  I will not be able to refill at this time.

## 2022-06-26 ENCOUNTER — Telehealth: Payer: Self-pay | Admitting: Nurse Practitioner

## 2022-06-30 ENCOUNTER — Telehealth (HOSPITAL_COMMUNITY): Payer: Self-pay | Admitting: Psychiatry

## 2022-06-30 NOTE — Telephone Encounter (Signed)
This patient called in and said that she took her diazepam with her to work and left it in her car. When she went back out to her car the prescription was gone. She said she doesn't know if someone has stolen it or if she lost it but she said she has looked everywhere and cant find it. She is worried that she is going to have withdrawals since she has been on it for so long. She would like to hear from someone today.  Phone # [phone]

## 2022-07-01 ENCOUNTER — Telehealth: Payer: Self-pay

## 2022-07-01 NOTE — Telephone Encounter (Signed)
Dr. Casimiro Needle called the pharmacy 06/30/22.

## 2022-07-01 NOTE — Telephone Encounter (Signed)
Transition Care Management Follow-up Telephone Call Date of discharge and from where: 07/01/22 from [facility] for Acute cough How have you been since you were released from the hospital? Has low grade fever, cough. Just wanted to be sure she didn't have anything serious b/c she has knee replacement surgery on 07/12/22. Any questions or concerns? No  Items Reviewed: Did the pt receive and understand the discharge instructions provided? Yes  Medications obtained and verified? Yes  Other?  URI, neg flu & covid     Follow up appointments reviewed:  PCP Hospital f/u appt confirmed? No  Pt has new PCP.

## 2022-07-06 NOTE — Progress Notes (Deleted)
NEUROLOGY FOLLOW UP OFFICE NOTE  Sabrina [name] [id number]  Assessment/Plan:   Migraine without aura, without status migrainosus, not intractable   Migraine prevention:  On venlafaxine XR 150mg  daily by her psychiatrist Migraine rescue:  Fioricet- discussed risk of rebound headache.  As her headaches are infrequent, I am okay with continuing it for now. Limit use of pain relievers to no more than 2 days out of week to prevent risk of rebound or medication-overuse headache. Keep headache diary Follow up one year  Subjective:  Sabrina [name] is a 56 year old woman with bullous pemphigoid and anxiety with panic disorder who follows up for migraines and tension-type headaches.   UPDATE:  Intensity:  8/10 Duration:  Reduced intensity within 45-60 minutes with Fioricet and completely resolves after 4 hours. Frequency:  every 2 to 3 weeks   Frequency of abortive medication: 2 days a week Current NSAIDS:  none Current analgesics:  none Current triptans:  none Current ergotamine:  none Current anti-emetic:  promethazine 25mg   Current muscle relaxants:  none Current anti-anxiolytic:  Valium Current sleep aide:  none Current Antihypertensive medications:  none Current Antidepressant medications:  Venlafaxine XR 150mg  daily  Current Anticonvulsant medications:  Gabapentin 600mg  three times daily Current anti-CGRP:  none Current Vitamins/Herbal/Supplements:  D Current Antihistamines/Decongestants:  none Other therapy:  none Hormone/birth control:  none Other medications:  diazepam 10mg  AM and 20mg  QHS   Caffeine:  No coffee or colas Diet:  Hydrates with water.  Smooties Exercise:  Tries to walk but has trouble with her knees.  Likes to ride her horse but first needs knee surgery Depression/Anxiety:  anxiety with panic attacks.   Other pain:  pain due to the bullous pemphigoid.  Bilateral knee pain Sleep hygiene:  poor   HISTORY:  Onset:  Her late-20s Location:  Over left  eyebrow Quality:  Stabbing, pounding, pressure Initial intensity:  8-9/10.  She denies new headache, thunderclap headache Aura:  No Premonitory Phase:  No Postdrome:  no Associated symptoms:  Nausea, vomiting.  She denies associated photophobia, phonophobia, unilateral numbness or weakness. Initial Duration:  Usually 1 day.  Longest lasted 3 days. Initial Frequency:  3 to 4 days a month but more frequently lately due to increased stress. Initial Frequency of abortive medication: Fioricet 1-2 days a week.  Excedrin most days a week (for tension-type headache) Triggers:  Emotional stress Relieving factors:  Laying down Activity:  Aggravated when sitting up or standing upright She also has tension type headaches which are mild to moderate bifrontal pressure, almost daily   Past NSAIDS:  naproxen, meloxicam Past analgesics:  tramadol, Excedrin, Goody powder, Tylenol, Fioricet Past abortive triptans:  rizatriptan 10mg  Past abortive ergotamine:  none Past muscle relaxants:  none Past anti-emetic:  Compazine 10mg , Phenergan, Zofran (allergy) Past antihypertensive medications:  none Past antidepressant medications:  Lexapro, Paxil, Zoloft Past anticonvulsant medications:  Topiramate 25mg  twice daily (on for one month) Past anti-CGRP:  none Past vitamins/Herbal/Supplements:  none Past antihistamines/decongestants:  none Other past therapies:  none   Family history of headache:  Father (migraines)  PAST MEDICAL HISTORY: 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HCC)  Poisoning, snake bite 04/08/2016   "[place]; RUE"   PONV (postoperative nausea and vomiting)    S/P right knee surgery    Situational  anxiety    Staph infection ~ 2015   "left elbow and finger"    MEDICATIONS: Current Outpatient Medications on File Prior to Visit  Medication Sig Dispense Refill   albuterol (VENTOLIN HFA) 108 (90 Base) MCG/ACT inhaler Inhale 2 puffs into the lungs every 6 (six) hours as needed for wheezing or shortness of breath (Cough). 18 g 1   atorvastatin (LIPITOR) 20 MG tablet TAKE 1 TABLET BY MOUTH ONCE A DAY 90 tablet 1   BREO ELLIPTA 100-25 MCG/ACT AEPB Inhale 1 puff into the lungs daily. 60 each 0   butalbital-acetaminophen-caffeine (FIORICET) 50-325-40 MG tablet TAKE ONE TABLET BY MOUTH EVERY 6 HOURS AS NEEDED FOR HEADACHE 10 tablet 2   diazepam (VALIUM) 10 MG tablet Take one tablet (10 mg total) by mouth three times daily. 90 tablet 4   gabapentin (NEURONTIN) 300 MG capsule Take 2 capsules (600 mg total) by mouth 3 (three) times daily. 540 capsule 1   SUMAtriptan (IMITREX) 50 MG tablet Take 1 tablet at onset of headache with 400 mg of ibuprofen and a spoonful of peanut butter.  May take second sumatriptan tablet in 2 hours if headache persists. 10 tablet 0   venlafaxine XR (EFFEXOR-XR) 150 MG 24 hr capsule Take 1 capsule (150 mg total) by mouth every morning. 90 capsule 1   No current facility-administered medications on file prior to visit.    ALLERGIES: Allergies  Allergen Reactions   Ondansetron Rash and Swelling    Spots around iv site IV zofran   Zofran [Ondansetron Hcl] Hives and Other (See Comments)    Spots around iv site   Bromfed Dm [Pseudoeph-Bromphen-Dm] Other (See Comments)    anxiety   Diclofenac Sodium Rash   Droperidol Palpitations   Flexeril [Cyclobenzaprine] Anxiety   Morphine Itching   Morphine And Related Itching   Tessalon [Benzonatate] Other (See Comments)    Conjunctivitis, watery eyes.    FAMILY HISTORY: Family History  Problem Relation Age of Onset   Liver disease Mother    Dementia Mother    Cirrhosis Mother    Prostate cancer Father    Colon cancer  Maternal Grandmother    Ovarian cancer Maternal Aunt       Objective:  *** General: No acute distress.  Patient appears well-groomed.   Head:  Normocephalic/atraumatic Eyes:  Fundi examined but not visualized Neck: supple, no paraspinal tenderness, full range of motion Heart:  Regular rate and rhythm Neurological Exam: alert and oriented to person, place, and time.  Speech fluent and not dysarthric, language intact.  CN II-XII intact. Bulk and tone normal, muscle strength 5/5 throughout.  Sensation to light touch intact.  Deep tendon reflexes 2+ throughout, toes downgoing.  Finger to nose testing intact.  Gait normal, Romberg negative.   Metta Clines, DO  CC: Doreatha Lew, MD

## 2022-07-11 ENCOUNTER — Encounter: Payer: Self-pay | Admitting: Neurology

## 2022-07-11 ENCOUNTER — Ambulatory Visit: Payer: Self-pay | Admitting: Neurology

## 2022-07-11 DIAGNOSIS — Z0279 Encounter for issue of other medical certificate: Secondary | ICD-10-CM

## 2022-08-03 ENCOUNTER — Telehealth (HOSPITAL_COMMUNITY): Payer: Self-pay | Admitting: Psychiatry

## 2022-08-03 NOTE — Telephone Encounter (Signed)
Patient wanted me to send you a message letting you know that she ended up finding her diazepam.

## 2022-08-21 ENCOUNTER — Other Ambulatory Visit (HOSPITAL_COMMUNITY): Payer: Self-pay | Admitting: Psychiatry

## 2022-08-27 ENCOUNTER — Telehealth: Payer: Self-pay | Admitting: Nurse Practitioner

## 2022-08-27 DIAGNOSIS — U099 Post covid-19 condition, unspecified: Secondary | ICD-10-CM

## 2022-08-27 DIAGNOSIS — R053 Chronic cough: Secondary | ICD-10-CM

## 2022-08-27 NOTE — Patient Instructions (Signed)
Sabrina [name], thank you for joining Bennie Pierini, FNP for today's virtual visit.  While this provider is not your primary care provider (PCP), if your PCP is located in our provider database this encounter information will be shared with them immediately following your visit.   A [facility] MyChart account gives you access to today's visit and all your visits, tests, and labs performed at [facility] " click here if you don't have a [facility] MyChart account or go to [email or web address]  Consent: (Patient) Sabrina [name] provided verbal consent for this virtual visit at the beginning of the encounter.  Current Medications:  Current Outpatient Medications:    albuterol (VENTOLIN HFA) 108 (90 Base) MCG/ACT inhaler, Inhale 2 puffs into the lungs every 6 (six) hours as needed for wheezing or shortness of breath (Cough)., Disp: 18 g, Rfl: 1   atorvastatin (LIPITOR) 20 MG tablet, TAKE 1 TABLET BY MOUTH ONCE A DAY, Disp: 90 tablet, Rfl: 1   BREO ELLIPTA 100-25 MCG/ACT AEPB, Inhale 1 puff into the lungs daily., Disp: 60 each, Rfl: 0   butalbital-acetaminophen-caffeine (FIORICET) 50-325-40 MG tablet, TAKE ONE TABLET BY MOUTH EVERY 6 HOURS AS NEEDED FOR HEADACHE, Disp: 10 tablet, Rfl: 2   diazepam (VALIUM) 10 MG tablet, Take one tablet (10 mg total) by mouth three times daily., Disp: 90 tablet, Rfl: 4   gabapentin (NEURONTIN) 300 MG capsule, Take 2 capsules (600 mg total) by mouth 3 (three) times daily., Disp: 540 capsule, Rfl: 1   SUMAtriptan (IMITREX) 50 MG tablet, Take 1 tablet at onset of headache with 400 mg of ibuprofen and a spoonful of peanut butter.  May take second sumatriptan tablet in 2 hours if headache persists., Disp: 10 tablet, Rfl: 0   venlafaxine XR (EFFEXOR-XR) 150 MG 24 hr capsule, Take 1 capsule (150 mg total) by mouth every morning., Disp: 90 capsule, Rfl: 1   Medications ordered in this encounter:  No orders of the defined types were placed in this  encounter.    *If you need refills on other medications prior to your next appointment, please contact your pharmacy*  Follow-Up: Call back or seek an in-person evaluation if the symptoms worsen or if the condition fails to improve as anticipated.  [facility] [phone]  Other Instructions 1. Take meds as prescribed 2. Use a cool mist humidifier especially during the winter months and when heat has been humid. 3. Use saline nose sprays frequently 4. Saline irrigations of the nose can be very helpful if done frequently.  * 4X daily for 1 week*  * Use of a nettie pot can be helpful with this. Follow directions with this* 5. Drink plenty of fluids 6. Keep thermostat turn down low 7.For any cough or congestion- delsym OTC 8. For fever or aces or pains- take tylenol or ibuprofen appropriate for age and weight.  * for fevers greater than 101 orally you may alternate ibuprofen and tylenol every  3 hours.      If you have been instructed to have an in-person evaluation today at a local Urgent Care facility, please use the link below. It will take you to a list of all of our available [facility], including address, phone number and hours of operation. Please do not delay care.  [facility]  If you or a family member do not have a primary care provider, use the link below to schedule a visit and establish care. When you choose  a [facility] primary care physician or advanced practice provider, you gain a long-term partner in health. Find a Primary Care Provider  Learn more about 's in-office and virtual care options: [facility]

## 2022-08-27 NOTE — Progress Notes (Signed)
Virtual Visit Consent   Sabrina [name], you are scheduled for a virtual visit with Mary-Margaret Daphine Deutscher, FNP, a [facility] provider, today.     Just as with appointments in the office, your consent must be obtained to participate.  Your consent will be active for this visit and any virtual visit you may have with one of our providers in the next 365 days.     If you have a MyChart account, a copy of this consent can be sent to you electronically.  All virtual visits are billed to your insurance company just like a traditional visit in the office.    As this is a virtual visit, video technology does not allow for your provider to perform a traditional examination.  This may limit your provider's ability to fully assess your condition.  If your provider identifies any concerns that need to be evaluated in person or the need to arrange testing (such as labs, EKG, etc.), we will make arrangements to do so.     Although advances in technology are sophisticated, we cannot ensure that it will always work on either your end or our end.  If the connection with a video visit is poor, the visit may have to be switched to a telephone visit.  With either a video or telephone visit, we are not always able to ensure that we have a secure connection.     I need to obtain your verbal consent now.   Are you willing to proceed with your visit today? YES   Sabrina [name] has provided verbal consent on 08/27/2022 for a virtual visit (video or telephone).   Mary-Margaret Daphine Deutscher, FNP   Date: 08/27/2022 1:26 PM   Virtual Visit via Video Note   I, Mary-Margaret Daphine Deutscher, connected with Sabrina [name] ([id number], 05/04/1966) on 08/27/22 at  1:30 PM EST by a video-enabled telemedicine application and verified that I am speaking with the correct person using two identifiers.  Location: Patient: Virtual Visit Location Patient: Home Provider: Virtual Visit Location Provider: Mobile   I discussed the limitations of  evaluation and management by telemedicine and the availability of in person appointments. The patient expressed understanding and agreed to proceed.    History of Present Illness: Sabrina [name] is a 56 y.o. who identifies as a female who was assigned female at birth, and is being seen today for cough .  HPI: Has post covid cough since 2020. She was called in promethazine which usually helps but not this time. Had total knee replacement 6 weeks ago and cannot take steroids.   Cough This is a chronic problem. Episode onset: 2020 since having covid. has frequent flare ups. The problem occurs every few hours. The cough is Productive of sputum. Pertinent negatives include no chills, ear pain, nasal congestion or shortness of breath. The symptoms are aggravated by animals. She has tried prescription cough suppressant (albuterol) for the symptoms. The treatment provided mild relief.    Review of Systems  Constitutional:  Negative for chills.  HENT:  Negative for ear pain.   Respiratory:  Positive for cough. Negative for shortness of breath.     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   Other meniscus derangements, posterior horn of medial meniscus, left knee 02/19/2019   Bucket handle tear of lateral meniscus 02/19/2019   Unilateral primary osteoarthritis, right knee 12/13/2018   Panic disorder 12/13/2018   Chronic headaches 12/13/2018   Weakness 09/06/2017   Depression 09/06/2017   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   Generalized anxiety disorder with panic attacks 09/06/2017   Right hand pain    Wrist swelling    Cellulitis of right upper extremity 04/11/2016   Elbow pain  07/20/2015   Anxiety 07/20/2015   Tachycardia 07/04/2015   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HCC)    Skin ulcer of upper arm, limited to breakdown of skin (HCC) 07/01/2015    Allergies:  Allergies  Allergen Reactions   Ondansetron Rash and Swelling    Spots around iv site IV zofran   Zofran [Ondansetron Hcl] Hives and Other (See Comments)    Spots around iv site   Bromfed Dm [Pseudoeph-Bromphen-Dm] Other (See Comments)    anxiety   Diclofenac Sodium Rash   Droperidol Palpitations   Flexeril [Cyclobenzaprine] Anxiety   Morphine Itching   Morphine And Related Itching   Tessalon [Benzonatate] Other (See Comments)    Conjunctivitis, watery eyes.   Medications:  Current Outpatient Medications:    albuterol (VENTOLIN HFA) 108 (90 Base) MCG/ACT inhaler, Inhale 2 puffs into the lungs every 6 (six) hours as needed for wheezing or shortness of breath (Cough)., Disp: 18 g, Rfl: 1   atorvastatin (LIPITOR) 20 MG tablet, TAKE 1 TABLET BY MOUTH ONCE A DAY, Disp: 90 tablet, Rfl: 1   BREO ELLIPTA 100-25 MCG/ACT AEPB, Inhale 1 puff into the lungs daily., Disp: 60 each, Rfl: 0   butalbital-acetaminophen-caffeine (FIORICET) 50-325-40 MG tablet, TAKE ONE TABLET BY MOUTH EVERY 6 HOURS AS NEEDED FOR HEADACHE, Disp: 10 tablet, Rfl: 2   diazepam (VALIUM) 10 MG tablet, Take one tablet (10 mg total) by mouth three times daily., Disp: 90 tablet, Rfl: 4   gabapentin (NEURONTIN) 300 MG capsule, Take 2 capsules (600 mg total) by mouth 3 (three) times daily., Disp: 540 capsule, Rfl: 1   SUMAtriptan (IMITREX) 50 MG tablet, Take 1 tablet at onset of headache with 400 mg of ibuprofen and a spoonful of peanut butter.  May take second sumatriptan tablet in 2 hours if headache persists., Disp: 10 tablet, Rfl: 0   venlafaxine XR (EFFEXOR-XR) 150 MG 24 hr capsule, Take 1 capsule (150 mg total) by mouth every morning., Disp: 90 capsule, Rfl: 1  Observations/Objective: Patient is well-developed, well-nourished in no acute distress.   Resting comfortably  at home.  Head is normocephalic, atraumatic.  No labored breathing.  Speech is clear and coherent with logical content.  Patient is alert and oriented at baseline.  Deep cough  Assessment and Plan:  [name] in today with chief complaint of No chief complaint on file.   1. Post-COVID chronic cough 1. Take meds as prescribed 2. Use a cool mist humidifier especially during the winter months and when heat has been humid. 3. Use saline nose sprays frequently 4. Saline irrigations of the nose can be very helpful if done frequently.  * 4X daily for 1 week*  * Use of a nettie pot can be helpful with this. Follow directions with this* 5. Drink plenty of fluids 6. Keep thermostat turn down low 7.For any cough or congestion- delsym OTC 8. For fever or aces or pains- take tylenol or ibuprofen appropriate for age  and weight.  * for fevers greater than 101 orally you may alternate ibuprofen and tylenol every  3 hours.    Follow Up Instructions: I discussed the assessment and treatment plan with the patient. The patient was provided an opportunity to ask questions and all were answered. The patient agreed with the plan and demonstrated an understanding of the instructions.  A copy of instructions were sent to the patient via MyChart.  The patient was advised to call back or seek an in-person evaluation if the symptoms worsen or if the condition fails to improve as anticipated.  Time:  I spent 6 minutes with the patient via telehealth technology discussing the above problems/concerns.    Mary-Margaret Daphine Deutscher, FNP

## 2022-08-28 ENCOUNTER — Telehealth: Payer: Self-pay | Admitting: Family

## 2022-08-28 ENCOUNTER — Telehealth (HOSPITAL_COMMUNITY): Payer: Self-pay

## 2022-08-28 DIAGNOSIS — U099 Post covid-19 condition, unspecified: Secondary | ICD-10-CM

## 2022-08-28 DIAGNOSIS — J209 Acute bronchitis, unspecified: Secondary | ICD-10-CM

## 2022-08-28 DIAGNOSIS — R053 Chronic cough: Secondary | ICD-10-CM

## 2022-08-28 MED ORDER — PROMETHAZINE-DM 6.25-15 MG/5ML PO SYRP: 5.0000 mL | ORAL_SOLUTION | Freq: Three times a day (TID) | ORAL | 1 refills | Status: DC | PRN

## 2022-08-28 NOTE — Progress Notes (Signed)
Virtual Visit Consent   Sabrina [name], you are scheduled for a virtual visit with a [facility] provider today. Just as with appointments in the office, your consent must be obtained to participate. Your consent will be active for this visit and any virtual visit you may have with one of our providers in the next 365 days. If you have a MyChart account, a copy of this consent can be sent to you electronically.  As this is a virtual visit, video technology does not allow for your provider to perform a traditional examination. This may limit your provider's ability to fully assess your condition. If your provider identifies any concerns that need to be evaluated in person or the need to arrange testing (such as labs, EKG, etc.), we will make arrangements to do so. Although advances in technology are sophisticated, we cannot ensure that it will always work on either your end or our end. If the connection with a video visit is poor, the visit may have to be switched to a telephone visit. With either a video or telephone visit, we are not always able to ensure that we have a secure connection.  By engaging in this virtual visit, you consent to the provision of healthcare and authorize for your insurance to be billed (if applicable) for the services provided during this visit. Depending on your insurance coverage, you may receive a charge related to this service.  I need to obtain your verbal consent now. Are you willing to proceed with your visit today? LECHELLE WRIGLEY has provided verbal consent on 08/28/2022 for a virtual visit (video or telephone). Jannifer Rodney, FNP  Date: 08/28/2022 2:31 PM  Virtual Visit via Video Note   I, Jannifer Rodney, connected with  Sabrina [name]  ([id number], 01/02/1966) on 08/28/22 at  2:15 PM EST by a video-enabled telemedicine application and verified that I am speaking with the correct person using two identifiers.  Location: Patient: Virtual Visit Location Patient:  Home Provider: Virtual Visit Location Provider: Home Office   I discussed the limitations of evaluation and management by telemedicine and the availability of in person appointments. The patient expressed understanding and agreed to proceed.    History of Present Illness: Sabrina [name] is a 56 y.o. who identifies as a female who was assigned female at birth, and is being seen today for cough. Reports she has a chronic cough since 2020 after having COVID.   HPI: Cough This is a new problem. The current episode started 1 to 4 weeks ago. The problem has been waxing and waning. The problem occurs every few minutes. The cough is Non-productive. Associated symptoms include nasal congestion. Pertinent negatives include no chills, ear congestion, ear pain, fever, headaches, myalgias, rhinorrhea, sore throat, shortness of breath or wheezing. She has tried rest and OTC cough suppressant for the symptoms. The treatment provided mild relief.    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   Other meniscus derangements, posterior horn of medial meniscus, left knee 02/19/2019   Bucket handle tear of lateral meniscus 02/19/2019   Unilateral primary osteoarthritis, right knee 12/13/2018   Panic disorder 12/13/2018   Chronic  headaches 12/13/2018   Weakness 09/06/2017   Depression 09/06/2017   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   Generalized anxiety disorder with panic attacks 09/06/2017   Right hand pain    Wrist swelling    Cellulitis of right upper extremity 04/11/2016   Elbow pain 07/20/2015   Anxiety 07/20/2015   Tachycardia 07/04/2015   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HCC)    Skin ulcer of upper  arm, limited to breakdown of skin (HCC) 07/01/2015    Allergies:  Allergies  Allergen Reactions   Ondansetron Rash and Swelling    Spots around iv site IV zofran   Zofran [Ondansetron Hcl] Hives and Other (See Comments)    Spots around iv site   Bromfed Dm [Pseudoeph-Bromphen-Dm] Other (See Comments)    anxiety   Diclofenac Sodium Rash   Droperidol Palpitations   Flexeril [Cyclobenzaprine] Anxiety   Morphine Itching   Morphine And Related Itching   Tessalon [Benzonatate] Other (See Comments)    Conjunctivitis, watery eyes.   Medications:  Current Outpatient Medications:    promethazine-dextromethorphan (PROMETHAZINE-DM) 6.25-15 MG/5ML syrup, Take 5 mLs by mouth 3 (three) times daily as needed for cough., Disp: 473 mL, Rfl: 1   albuterol (VENTOLIN HFA) 108 (90 Base) MCG/ACT inhaler, Inhale 2 puffs into the lungs every 6 (six) hours as needed for wheezing or shortness of breath (Cough)., Disp: 18 g, Rfl: 1   atorvastatin (LIPITOR) 20 MG tablet, TAKE 1 TABLET BY MOUTH ONCE A DAY, Disp: 90 tablet, Rfl: 1   BREO ELLIPTA 100-25 MCG/ACT AEPB, Inhale 1 puff into the lungs daily., Disp: 60 each, Rfl: 0   butalbital-acetaminophen-caffeine (FIORICET) 50-325-40 MG tablet, TAKE ONE TABLET BY MOUTH EVERY 6 HOURS AS NEEDED FOR HEADACHE, Disp: 10 tablet, Rfl: 2   diazepam (VALIUM) 10 MG tablet, Take one tablet (10 mg total) by mouth three times daily., Disp: 90 tablet, Rfl: 4   gabapentin (NEURONTIN) 300 MG capsule, Take 2 capsules (600 mg total) by mouth 3 (three) times daily., Disp: 540 capsule, Rfl: 1   SUMAtriptan (IMITREX) 50 MG tablet, Take 1 tablet at onset of headache with 400 mg of ibuprofen and a spoonful of peanut butter.  May take second sumatriptan tablet in 2 hours if headache persists., Disp: 10 tablet, Rfl: 0   venlafaxine XR (EFFEXOR-XR) 150 MG 24 hr capsule, Take 1 capsule (150 mg total) by mouth every morning., Disp: 90 capsule, Rfl: 1  Observations/Objective: Patient is  well-developed, well-nourished in no acute distress.  Resting comfortably  at home.  Head is normocephalic, atraumatic.  No labored breathing.  Speech is clear and coherent with logical content.  Patient is alert and oriented at baseline.  Dry nonproductive cough  Assessment and Plan: 1. COVID-19 long hauler manifesting chronic cough - promethazine-dextromethorphan (PROMETHAZINE-DM) 6.25-15 MG/5ML syrup; Take 5 mLs by mouth 3 (three) times daily as needed for cough.  Dispense: 473 mL; Refill: 1  2. Acute bronchitis, unspecified organism - promethazine-dextromethorphan (PROMETHAZINE-DM) 6.25-15 MG/5ML syrup; Take 5 mLs by mouth 3 (three) times daily as needed for cough.  Dispense: 473 mL; Refill: 1  - Take meds as prescribed - Use a cool mist humidifier  -Use saline nose sprays frequently -Force fluids -For any cough or congestion  Use plain Mucinex- regular strength or max strength is fine -For fever or aces or pains- take tylenol or ibuprofen. -Throat lozenges if help -Follow up if symptoms worsen or do not improve  Follow Up Instructions: I discussed the assessment and treatment plan with the patient. The  patient was provided an opportunity to ask questions and all were answered. The patient agreed with the plan and demonstrated an understanding of the instructions.  A copy of instructions were sent to the patient via MyChart unless otherwise noted below.     The patient was advised to call back or seek an in-person evaluation if the symptoms worsen or if the condition fails to improve as anticipated.  Time:  I spent 12 minutes with the patient via telehealth technology discussing the above problems/concerns.    Evelina Dun, FNP

## 2022-08-28 NOTE — Patient Instructions (Signed)

## 2022-08-30 ENCOUNTER — Ambulatory Visit (HOSPITAL_COMMUNITY): Payer: BLUE CROSS/BLUE SHIELD | Admitting: Psychiatry

## 2022-09-16 ENCOUNTER — Ambulatory Visit (INDEPENDENT_AMBULATORY_CARE_PROVIDER_SITE_OTHER): Payer: Self-pay | Admitting: Psychiatry

## 2022-09-16 DIAGNOSIS — F325 Major depressive disorder, single episode, in full remission: Secondary | ICD-10-CM

## 2022-09-16 MED ORDER — DIAZEPAM 10 MG PO TABS: ORAL_TABLET | ORAL | 4 refills | Status: DC

## 2022-09-16 MED ORDER — VENLAFAXINE HCL ER 150 MG PO CP24: 150.0000 mg | ORAL_CAPSULE | Freq: Every morning | ORAL | 1 refills | Status: DC

## 2022-09-16 NOTE — Progress Notes (Signed)
This is used to review the EEG "[facility] MD/PA/NP OP Progress Note  09/16/2022 10:47 AM Sabrina [name]  MRN:  [id number]  Chief Complaint:      Today the patient is seen in the office.  Today the patient is doing really well.  She had her knee surgery which was difficulty but she is recovered fairly well she is still in physical therapy.  She will probably need her other knee eventually.  The patient's health is reasonably good.  Her headaches are well-controlled.  She is still recovering from her knee surgery.  Financially she is fairly stable.  She lives with her cousin who helps her with her housing.  The patient is in no relationship at this time.  Today we had a long discussion about her issues with Valium.  I think this patient takes Valium very appropriately.  I had no evidence of drug abuse or misuse.  The patient recently had thought she did had stolen from her car but she then later found in her home in her pocketbook.  Unfortunately her pocketbook was not where she usually puts it.  The patient is not having any problems with Valium.  Nonetheless today we decided to reduce it from 30 mg down to 20 mg she will take 10 mg twice daily.  She will continue taking Effexor doing very well.  I think this patient is very stable and I do not think there are issues with this use of her Valium.  She drinks no alcohol and uses no drugs. Virtual Visit via Telephone Note  I connected with Sabrina [name] on 09/16/22 at 10:00 AM EST by telephone and verified that I am speaking with the correct person using two identifiers.  Location: Patient: home Provider: office   I discussed the limitations, risks, security and privacy concerns of performing an evaluation and management service by telephone and the availability of in person appointments. I also discussed with the patient that there may be a patient responsible charge related to this service. The patient expressed understanding and agreed to proceed.     I  discussed the assessment and treatment plan with the patient. The patient was provided an opportunity to ask questions and all were answered. The patient agreed with the plan and demonstrated an understanding of the instructions.   The patient was advised to call back or seek an in-person evaluation if the symptoms worsen or if the condition fails to improve as anticipated.  I provided 30 minutes of non-face-to-face time during this encounter.   Jerral Ralph, MD   Virtual Visit via Telephone Note  I connected with Sabrina [name] on 09/16/22 at 10:00 AM EST by telephone and verified that I am speaking with the correct person using two identifiers.  Location: Patient: home Provider: office   I discussed the limitations, risks, security and privacy concerns of performing an evaluation and management service by telephone and the availability of in person appointments. I also discussed with the patient that there may be a patient responsible charge related to this service. The patient expressed understanding and agreed to proceed.     I discussed the assessment and treatment plan with the patient. The patient was provided an opportunity to ask questions and all were answered. The patient agreed with the plan and demonstrated an understanding of the instructions.   The patient was advised to call back or seek an in-person evaluation if the symptoms worsen or if the condition fails to improve  as anticipated.  I provided 30 minutes of non-face-to-face time during this encounter.   [name], MD          Past Medical History:  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HCC)    Poisoning, snake  bite 04/08/2016   "[place]; RUE"   PONV (postoperative nausea and vomiting)    S/P right knee surgery    Situational anxiety    Staph infection ~ 2015   "left elbow and finger"    Past Surgical History:  Procedure Laterality Date   APPENDECTOMY  ~ 1987   APPLICATION OF A-CELL OF EXTREMITY Left 08/05/2015   Procedure: APPLICATION OF A-CELL OF EXTREMITY;  Surgeon: Peggye Form, DO;  Location: [facility];  Service: Plastics;  Laterality: Left;   BREAST SURGERY Right 1990   "milk duct taken out"   CHONDROPLASTY Right 02/19/2019   Procedure: CHONDROPLASTY; EXCISION EXOSTOSIS;  Surgeon: Valeria Batman, MD;  Location: [facility];  Service: Orthopedics;  Laterality: Right;   DEBRIDEMENT AND CLOSURE WOUND Left 07/01/2015   Procedure: LEFT ELBOW EXCISION OF WOUND WITH PRIMARY CLOSURE 2X5 CM ;  Surgeon: Peggye Form, DO;  Location: [facility];  Service: Plastics;  Laterality: Left;   ELBOW SURGERY Left X 23 in [place] <06/2015   from a cat bite; all I&D   I & D EXTREMITY Left 07/08/2015   Procedure: IRRIGATION AND DEBRIDEMENT EXTREMITY, DRAINAGE OF LEFT ARM WOUND, A-CELL PLACEMENT, WOUND VAC PLACEMENT;  Surgeon: Alena Bills Dillingham, DO;  Location: [facility];  Service: Plastics;  Laterality: Left;   INCISION AND DRAINAGE OF WOUND Left 08/05/2015   Procedure: IRRIGATION AND DEBRIDEMENT LEFT ELBOW WOUND, PLACEMENT OF ACELL;  Surgeon: Peggye Form, DO;  Location: [facility];  Service: Plastics;  Laterality: Left;   KNEE ARTHROSCOPY WITH MEDIAL MENISECTOMY Right 02/19/2019   Procedure: RIGHT KNEE ARTHROSCOPY, DEBRIDEMENT, PARTIAL MEDIAL AND LATERAL MENISECTOMY;  Surgeon: Valeria Batman, MD;  Location: [facility];  Service: Orthopedics;  Laterality: Right;   LAPAROSCOPIC CHOLECYSTECTOMY  1998   SKIN GRAFT Left 2016   took from anterior thigh; placed at elbow   TONSILLECTOMY  ~ 2000   TOTAL ABDOMINAL  HYSTERECTOMY  2003   WRIST SURGERY Right 01/2016    Family Psychiatric History: See intake H&P for full details. Reviewed, with no updates at this time.   Family History:  Family History  Problem Relation Age of Onset   Liver disease Mother    Dementia Mother    Cirrhosis Mother    Prostate cancer Father    Colon cancer Maternal Grandmother    Ovarian cancer Maternal Aunt     Social History:  Social History   Socioeconomic History   Marital status: Divorced    Spouse name: Not on file   Number of children: 1   Years of education: Not on file   Highest education level: Associate degree: academic program  Occupational History   Occupation: unemployed  Tobacco Use   Smoking status: Never   Smokeless tobacco: Never  Vaping Use   Vaping Use: Never used  Substance  and Sexual Activity   Alcohol use: Not Currently    Alcohol/week: 0.0 standard drinks of alcohol   Drug use: No   Sexual activity: Not on file  Other Topics Concern   Not on file  Social History Narrative   Pateint is right-handed. She lives alone in a single level home. She rarely drinks caffeine. She is limited to exercise due to knee injuries.   Social Determinants of Health   Financial Resource Strain: Not on file  Food Insecurity: Not on file  Transportation Needs: Not on file  Physical Activity: Not on file  Stress: Not on file  Social Connections: Not on file    Allergies:  Allergies  Allergen Reactions   Ondansetron Rash and Swelling    Spots around iv site IV zofran   Zofran [Ondansetron Hcl] Hives and Other (See Comments)    Spots around iv site   Bromfed Dm [Pseudoeph-Bromphen-Dm] Other (See Comments)    anxiety   Diclofenac Sodium Rash   Droperidol Palpitations   Flexeril [Cyclobenzaprine] Anxiety   Morphine Itching   Morphine And Related Itching   Tessalon [Benzonatate] Other (See Comments)    Conjunctivitis, watery eyes.    Metabolic Disorder Labs: Lab Results  Component  Value Date   HGBA1C 5.4 03/20/2020   MPG 108 03/20/2020   MPG  07/05/2015    QUESTIONABLE IDENTIFICATION / INCORRECTLY LABELED SPECIMEN   No results found for: "PROLACTIN" Lab Results  Component Value Date   CHOL 267 (H) 03/20/2020   TRIG 76 03/20/2020   HDL 67 03/20/2020   CHOLHDL 4.0 03/20/2020   VLDL 29 04/27/2015   LDLCALC 182 (H) 03/20/2020   LDLCALC 122 (H) 06/18/2018   Lab Results  Component Value Date   TSH 1.52 03/20/2020   TSH 2.890 06/18/2018    Therapeutic Level Labs: No results found for: "LITHIUM" No results found for: "VALPROATE" No results found for: "CBMZ"  Current Medications: Current Outpatient Medications  Medication Sig Dispense Refill   albuterol (VENTOLIN HFA) 108 (90 Base) MCG/ACT inhaler Inhale 2 puffs into the lungs every 6 (six) hours as needed for wheezing or shortness of breath (Cough). 18 g 1   atorvastatin (LIPITOR) 20 MG tablet TAKE 1 TABLET BY MOUTH ONCE A DAY 90 tablet 1   BREO ELLIPTA 100-25 MCG/ACT AEPB Inhale 1 puff into the lungs daily. 60 each 0   butalbital-acetaminophen-caffeine (FIORICET) 50-325-40 MG tablet TAKE ONE TABLET BY MOUTH EVERY 6 HOURS AS NEEDED FOR HEADACHE 10 tablet 2   diazepam (VALIUM) 10 MG tablet 1 bid 60 tablet 4   gabapentin (NEURONTIN) 300 MG capsule Take 2 capsules (600 mg total) by mouth 3 (three) times daily. 540 capsule 1   promethazine-dextromethorphan (PROMETHAZINE-DM) 6.25-15 MG/5ML syrup Take 5 mLs by mouth 3 (three) times daily as needed for cough. 473 mL 1   SUMAtriptan (IMITREX) 50 MG tablet Take 1 tablet at onset of headache with 400 mg of ibuprofen and a spoonful of peanut butter.  May take second sumatriptan tablet in 2 hours if headache persists. 10 tablet 0   venlafaxine XR (EFFEXOR-XR) 150 MG 24 hr capsule Take 1 capsule (150 mg total) by mouth every morning. 90 capsule 1   No current facility-administered medications for this visit.     Musculoskeletal: Strength & Muscle Tone: within normal  limits Gait & Station: normal Patient leans: N/A  Psychiatric Specialty Exam: ROS  There were no vitals taken for this visit.There is no height or weight on file to  calculate BMI.  General Appearance: Casual and Well Groomed  Eye Contact:  Good  Speech:  Clear and Coherent  Volume:  Normal  Mood:  Euthymic  Affect:  Congruent  Thought Process:  Goal Directed and Descriptions of Associations: Intact  Orientation:  Full (Time, Place, and Person)  Thought Content: Logical   Suicidal Thoughts:  No  Homicidal Thoughts:  No  Memory:  Immediate;   Fair  Judgement:  Fair  Insight:  Fair  Psychomotor Activity:  Normal  Concentration:  Concentration: Good  Recall:  Good  Fund of Knowledge: Good  Language: Good  Akathisia:  Negative  Handed:  Right  AIMS (if indicated): not done  Assets:  Communication Skills Desire for Improvement Housing Transportation  ADL's:  Intact  Cognition: WNL  Sleep:  Fair   Screenings: PHQ2-9    [place] Office Visit from 01/24/2020 in [facility] from 12/25/2019 in [facility] from 09/21/2018 in [facility] Visit from 06/25/2018 in [facility] Office Visit from 06/18/2018 in [facility]  PHQ-2 Total Score 2 0 1 0 0  PHQ-9 Total Score 5 2 -- -- --      Flowsheet Row ED from 04/15/2022 in [facility] [place] [facility] from 01/13/2022 in [facility] [place] [facility] from 09/24/2021 in [facility] [place] No Risk No Risk No Risk        Assessment and Plan:    This patient's diagnosis is major depression.  She will continue taking Effexor.  She also takes Valium which we will reduce to 10 mg twice daily.  She will return to see me in 3 or 4 months.  Status of current problems: gradually improving  Labs Ordered: No orders of the defined  types were placed in this encounter.   Labs Reviewed: n/a  Collateral Obtained/Records Reviewed: n/a  Plan:  Continue Lexapro 20 mg daily Increase Seroquel to 200 mg nightly Return to clinic in 3-4 months, transfer care to Dr. Thayer Dallas, MD 09/16/2022, 10:47 AM

## 2022-11-29 ENCOUNTER — Ambulatory Visit (HOSPITAL_COMMUNITY): Payer: Medicaid Other | Admitting: Psychiatry

## 2023-01-11 ENCOUNTER — Ambulatory Visit (HOSPITAL_COMMUNITY): Payer: BLUE CROSS/BLUE SHIELD | Admitting: Psychiatry

## 2023-02-14 ENCOUNTER — Encounter (HOSPITAL_COMMUNITY): Payer: Self-pay | Admitting: Psychiatry

## 2023-02-14 ENCOUNTER — Other Ambulatory Visit: Payer: Self-pay

## 2023-02-14 ENCOUNTER — Ambulatory Visit (HOSPITAL_BASED_OUTPATIENT_CLINIC_OR_DEPARTMENT_OTHER): Payer: BLUE CROSS/BLUE SHIELD | Admitting: Psychiatry

## 2023-02-14 VITALS — BP 128/93 | HR 94 | Ht 64.0 in | Wt 174.0 lb

## 2023-02-14 DIAGNOSIS — F325 Major depressive disorder, single episode, in full remission: Secondary | ICD-10-CM | POA: Diagnosis not present

## 2023-02-14 MED ORDER — VENLAFAXINE HCL ER 150 MG PO CP24: 150.0000 mg | ORAL_CAPSULE | Freq: Every morning | ORAL | 1 refills | Status: DC

## 2023-02-14 MED ORDER — DIAZEPAM 10 MG PO TABS: ORAL_TABLET | ORAL | 4 refills | Status: DC

## 2023-02-14 NOTE — Progress Notes (Signed)
This is used to review the EEG "BH MD/PA/NP OP Progress Note  02/14/2023 3:02 PM Sabrina [name]  MRN:  [id number]  Chief Complaint:      Today the patient was seen in the office.  She is doing very well.  She is going to move from her husband's basement to a 90 acre setting.  It is known that [place] who she is working.  The patient is going to move there in the next month or so and she is going to bring her 2 horses or people at her With her.  The veterinarian is also to bring some horses and the patient will be involved in caring for all horses in addition to working at her job as a Metallurgist.  She has had this job for a good long time.  The patient has a daughter who has a daughter who is 6 years old.  The patient is a grandmother.  The patient's mood is great.  She denies depression.  She enjoys watching TV she enjoys spending time with her animals.  She is sleeping and eating very well.  She has lost over 20 pounds over the last year because she has been on a better diet.  She drinks no alcohol and never uses drugs.  Her health is good.  Financially she is very stable.  Presently she is any romance.  Patient is happy with life.  She takes her medicines just as prescribed.  She takes her Valium 10 mg twice daily as prescribed but she has been on for many years.  Her anxiety and her depression are well controlled.  On her last visit we actually reduced her Valium and is doing well. Virtual Visit via Telephone Note  I connected with Sabrina [name] on 02/14/23 at  2:30 PM EDT by telephone and verified that I am speaking with the correct person using two identifiers.  Location: Patient: home Provider: office   I discussed the limitations, risks, security and privacy concerns of performing an evaluation and management service by telephone and the availability of in person appointments. I also discussed with the patient that there may be a patient responsible charge related to this service.  The patient expressed understanding and agreed to proceed.     I discussed the assessment and treatment plan with the patient. The patient was provided an opportunity to ask questions and all were answered. The patient agreed with the plan and demonstrated an understanding of the instructions.   The patient was advised to call back or seek an in-person evaluation if the symptoms worsen or if the condition fails to improve as anticipated.  I provided 30 minutes of non-face-to-face time during this encounter.   [name], MD   Virtual Visit via Telephone Note  I connected with Sabrina [name] on 02/14/23 at  2:30 PM EDT by telephone and verified that I am speaking with the correct person using two identifiers.  Location: Patient: home Provider: office   I discussed the limitations, risks, security and privacy concerns of performing an evaluation and management service by telephone and the availability of in person appointments. I also discussed with the patient that there may be a patient responsible charge related to this service. The patient expressed understanding and agreed to proceed.     I discussed the assessment and treatment plan with the patient. The patient was provided an opportunity to ask questions and all were answered. The patient agreed with the plan and  demonstrated an understanding of the instructions.   The patient was advised to call back or seek an in-person evaluation if the symptoms worsen or if the condition fails to improve as anticipated.  I provided 30 minutes of non-face-to-face time during this encounter.   [name], MD          Past Medical History:  Past Medical History:  Diagnosis Date   Arthritis    "knees" (09/06/2017)   Bullous pemphigus    Cat bite 06/2014   to left elbow   History of blood transfusion 1988   "when I had my baby"   Muscle weakness of lower extremity 2001; 09/05/2017   "resolved after a couple weeks;  ?" (09/06/2017)   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HCC)    Poisoning, snake bite 04/08/2016   "[place]; RUE"   PONV (postoperative nausea and vomiting)    S/P right knee surgery    Situational anxiety    Staph infection ~ 2015   "left elbow and finger"    Past Surgical History:  Procedure Laterality Date   APPENDECTOMY  ~ 1987   APPLICATION OF A-CELL OF EXTREMITY Left 08/05/2015   Procedure: APPLICATION OF A-CELL OF EXTREMITY;  Surgeon: Peggye Form, DO;  Location: [facility];  Service: Plastics;  Laterality: Left;   BREAST SURGERY Right 1990   "milk duct taken out"   CHONDROPLASTY Right 02/19/2019   Procedure: CHONDROPLASTY; EXCISION EXOSTOSIS;  Surgeon: Valeria Batman, MD;  Location: [facility];  Service: Orthopedics;  Laterality: Right;   DEBRIDEMENT AND CLOSURE WOUND Left 07/01/2015   Procedure: LEFT ELBOW EXCISION OF WOUND WITH PRIMARY CLOSURE 2X5 CM ;  Surgeon: Peggye Form, DO;  Location: [facility];  Service: Plastics;  Laterality: Left;   ELBOW SURGERY Left X 23 in [place] <06/2015   from a cat bite; all I&D   I & D EXTREMITY Left 07/08/2015   Procedure: IRRIGATION AND DEBRIDEMENT EXTREMITY, DRAINAGE OF LEFT ARM WOUND, A-CELL PLACEMENT, WOUND VAC PLACEMENT;  Surgeon: Alena Bills Dillingham, DO;  Location: [facility];  Service: Plastics;  Laterality: Left;   INCISION AND DRAINAGE OF WOUND Left 08/05/2015   Procedure: IRRIGATION AND DEBRIDEMENT LEFT ELBOW WOUND, PLACEMENT OF ACELL;  Surgeon: Peggye Form, DO;  Location: [facility];  Service: Plastics;  Laterality: Left;   KNEE ARTHROSCOPY WITH MEDIAL MENISECTOMY Right 02/19/2019   Procedure: RIGHT KNEE ARTHROSCOPY, DEBRIDEMENT, PARTIAL MEDIAL AND LATERAL MENISECTOMY;  Surgeon: Valeria Batman, MD;  Location: [facility];  Service: Orthopedics;  Laterality: Right;   LAPAROSCOPIC CHOLECYSTECTOMY  1998   SKIN GRAFT Left 2016   took from anterior  thigh; placed at elbow   TONSILLECTOMY  ~ 2000   TOTAL ABDOMINAL HYSTERECTOMY  2003   WRIST SURGERY Right 01/2016    Family Psychiatric History: See intake H&P for full details. Reviewed, with no updates at this time.   Family History:  Family History  Problem Relation Age of Onset   Liver disease Mother    Dementia Mother    Cirrhosis Mother    Prostate cancer Father    Colon cancer Maternal Grandmother    Ovarian cancer Maternal Aunt     Social History:  Social History   Socioeconomic History   Marital status: Divorced    Spouse name: Not on file   Number of children: 1   Years of education: Not on file   Highest education level: Associate degree: academic program  Occupational History   Occupation: unemployed  Tobacco Use   Smoking status: Never   Smokeless tobacco: Never  Vaping Use   Vaping Use: Never used  Substance and Sexual Activity   Alcohol use: Not Currently    Alcohol/week: 0.0 standard drinks of alcohol   Drug use: No   Sexual activity: Not on file  Other Topics Concern   Not on file  Social History Narrative   Pateint is right-handed. She lives alone in a single level home. She rarely drinks caffeine. She is limited to exercise due to knee injuries.   Social Determinants of Health   Financial Resource Strain: Not on file  Food Insecurity: Not on file  Transportation Needs: Not on file  Physical Activity: Not on file  Stress: Not on file  Social Connections: Not on file    Allergies:  Allergies  Allergen Reactions   Ondansetron Rash and Swelling    Spots around iv site IV zofran   Zofran [Ondansetron Hcl] Hives and Other (See Comments)    Spots around iv site   Bromfed Dm [Pseudoeph-Bromphen-Dm] Other (See Comments)    anxiety   Diclofenac Sodium Rash   Droperidol Palpitations   Flexeril [Cyclobenzaprine] Anxiety   Morphine Itching   Morphine And Codeine Itching   Tessalon [Benzonatate] Other (See Comments)    Conjunctivitis,  watery eyes.    Metabolic Disorder Labs: Lab Results  Component Value Date   HGBA1C 5.4 03/20/2020   MPG 108 03/20/2020   MPG  07/05/2015    QUESTIONABLE IDENTIFICATION / INCORRECTLY LABELED SPECIMEN   No results found for: "PROLACTIN" Lab Results  Component Value Date   CHOL 267 (H) 03/20/2020   TRIG 76 03/20/2020   HDL 67 03/20/2020   CHOLHDL 4.0 03/20/2020   VLDL 29 04/27/2015   LDLCALC 182 (H) 03/20/2020   LDLCALC 122 (H) 06/18/2018   Lab Results  Component Value Date   TSH 1.52 03/20/2020   TSH 2.890 06/18/2018    Therapeutic Level Labs: No results found for: "LITHIUM" No results found for: "VALPROATE" No results found for: "CBMZ"  Current Medications: Current Outpatient Medications  Medication Sig Dispense Refill   BREO ELLIPTA 100-25 MCG/ACT AEPB Inhale 1 puff into the lungs daily. 60 each 0   gabapentin (NEURONTIN) 300 MG capsule Take 2 capsules (600 mg total) by mouth 3 (three) times daily. 540 capsule 1   albuterol (VENTOLIN HFA) 108 (90 Base) MCG/ACT inhaler Inhale 2 puffs into the lungs every 6 (six) hours as needed for wheezing or shortness of breath (Cough). (Patient not taking: Reported on 02/14/2023) 18 g 1   atorvastatin (LIPITOR) 20 MG tablet TAKE 1 TABLET BY MOUTH ONCE A DAY 90 tablet 1   butalbital-acetaminophen-caffeine (FIORICET) 50-325-40 MG tablet TAKE ONE TABLET BY MOUTH EVERY 6 HOURS AS NEEDED FOR HEADACHE (Patient not taking: Reported on 02/14/2023) 10 tablet 2   diazepam (VALIUM) 10 MG tablet 1 bid 60 tablet 4   promethazine-dextromethorphan (PROMETHAZINE-DM) 6.25-15 MG/5ML syrup Take 5 mLs by mouth 3 (three) times daily as needed for cough. (Patient not taking: Reported on 02/14/2023) 473 mL 1   SUMAtriptan (IMITREX) 50 MG tablet Take 1 tablet at onset of headache with 400 mg of ibuprofen and a spoonful of peanut butter.  May take second sumatriptan tablet in 2 hours if headache persists. (Patient not taking: Reported on 02/14/2023) 10 tablet 0    venlafaxine XR (EFFEXOR-XR) 150 MG 24 hr capsule Take 1 capsule (150 mg total) by mouth every  morning. 90 capsule 1   No current facility-administered medications for this visit.     Musculoskeletal: Strength & Muscle Tone: within normal limits Gait & Station: normal Patient leans: N/A  Psychiatric Specialty Exam: ROS  Blood pressure (!) 128/93, pulse 94, height 5\' 4"  (1.626 m), weight 174 lb (78.9 kg).Body mass index is 29.87 kg/m.  General Appearance: Casual and Well Groomed  Eye Contact:  Good  Speech:  Clear and Coherent  Volume:  Normal  Mood:  Euthymic  Affect:  Congruent  Thought Process:  Goal Directed and Descriptions of Associations: Intact  Orientation:  Full (Time, Place, and Person)  Thought Content: Logical   Suicidal Thoughts:  No  Homicidal Thoughts:  No  Memory:  Immediate;   Fair  Judgement:  Fair  Insight:  Fair  Psychomotor Activity:  Normal  Concentration:  Concentration: Good  Recall:  Good  Fund of Knowledge: Good  Language: Good  Akathisia:  Negative  Handed:  Right  AIMS (if indicated): not done  Assets:  Communication Skills Desire for Improvement Housing Transportation  ADL's:  Intact  Cognition: WNL  Sleep:  Fair   Screenings: PHQ2-9    Flowsheet Row Office Visit from 01/24/2020 in [facility] [place] Video Visit from 12/25/2019 in [facility] [place] Office Visit from 09/21/2018 in [facility] Office Visit from 06/25/2018 in [facility] Office Visit from 06/18/2018 in [facility]  PHQ-2 Total Score 2 0 1 0 0  PHQ-9 Total Score 5 2 -- -- --      Flowsheet Row ED from 04/15/2022 in [facility] [employer] [facility]) ED from 01/13/2022 in [facility] [employer] [facility]) ED from 09/24/2021 in [facility] at [employer] [facility])  C-SSRS RISK CATEGORY No Risk No Risk No  Risk        Assessment and Plan:     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  She takes Effexor 150 mg a day.  She also has an adjustment disorder with an anxious mood state.  She takes Valium 10 mg twice daily.  She is very consistent and very reliable.  This patient will be seen again in 4 months  Status of current problems: gradually improving  Labs Ordered: No orders of the defined types were placed in this encounter.   Labs Reviewed: n/a  Collateral Obtained/Records Reviewed: n/a  Plan:  Continue Lexapro 20 mg daily Increase Seroquel to 200 mg nightly Return to clinic in 3-4 months, transfer care to Dr. Liz Beach, MD 02/14/2023, 3:02 PM

## 2023-02-17 ENCOUNTER — Ambulatory Visit (HOSPITAL_COMMUNITY): Payer: BLUE CROSS/BLUE SHIELD

## 2023-02-17 ENCOUNTER — Ambulatory Visit
Admission: EM | Admit: 2023-02-17 | Discharge: 2023-02-17 | Disposition: A | Discharge status: Home / Self Care | Payer: Medicaid Other

## 2023-02-17 DIAGNOSIS — Z8616 Personal history of COVID-19: Secondary | ICD-10-CM | POA: Diagnosis not present

## 2023-02-17 DIAGNOSIS — J209 Acute bronchitis, unspecified: Secondary | ICD-10-CM

## 2023-02-17 DIAGNOSIS — R053 Chronic cough: Secondary | ICD-10-CM

## 2023-02-17 DIAGNOSIS — U099 Post covid-19 condition, unspecified: Secondary | ICD-10-CM

## 2023-02-17 MED ORDER — PROMETHAZINE-DM 6.25-15 MG/5ML PO SYRP: 5.0000 mL | ORAL_SOLUTION | Freq: Every evening | ORAL | 0 refills | Status: DC | PRN

## 2023-02-17 NOTE — ED Provider Notes (Signed)
[facility]  Note:  This document was prepared using Conservation officer, historic buildings and may include unintentional dictation errors.  MRN: [id number] DOB: 12/31/1965  Subjective:   Sabrina [name] is a 57 y.o. female presenting for an acute on chronic cough.  Has been using Mucinex and Robitussin without any relief.  Denies fever, chest pain, shortness of breath or wheezing.  Patient is taking Breo Ellipta, albuterol and uses Flonase. No longer sees a pulmonologist.  However, she was advised this is likely sequelae from having multiple COVID infections.  Cannot do prednisone for now due to having to have a surgery for her right knee, is supposed to have a right knee replacement. No smoking of any kind including cigarettes, cigars, vaping, marijuana use.  She does take Claritin for allergies.  Has also used famotidine for acid reflux.  Generally she has done well with promethazine cough syrup.  No current facility-administered medications for this encounter.  Current Outpatient Medications:    diazepam (VALIUM) 5 MG/ML solution, Take by mouth every 8 (eight) hours as needed for anxiety., Disp: , Rfl:    rosuvastatin (CRESTOR) 20 MG tablet, Take 1 tablet by mouth daily., Disp: , Rfl:    albuterol (VENTOLIN HFA) 108 (90 Base) MCG/ACT inhaler, Inhale 2 puffs into the lungs every 6 (six) hours as needed for wheezing or shortness of breath (Cough)., Disp: 18 g, Rfl: 1   atorvastatin (LIPITOR) 20 MG tablet, TAKE 1 TABLET BY MOUTH ONCE A DAY, Disp: 90 tablet, Rfl: 1   BREO ELLIPTA 100-25 MCG/ACT AEPB, Inhale 1 puff into the lungs daily., Disp: 60 each, Rfl: 0   butalbital-acetaminophen-caffeine (FIORICET) 50-325-40 MG tablet, TAKE ONE TABLET BY MOUTH EVERY 6 HOURS AS NEEDED FOR HEADACHE, Disp: 10 tablet, Rfl: 2   diazepam (VALIUM) 10 MG tablet, 1 bid, Disp: 60 tablet, Rfl: 4   gabapentin (NEURONTIN) 300 MG capsule, Take 2 capsules (600 mg total) by mouth 3 (three) times daily.,  Disp: 540 capsule, Rfl: 1   promethazine-dextromethorphan (PROMETHAZINE-DM) 6.25-15 MG/5ML syrup, Take 5 mLs by mouth 3 (three) times daily as needed for cough. (Patient not taking: Reported on 02/14/2023), Disp: 473 mL, Rfl: 1   SUMAtriptan (IMITREX) 50 MG tablet, Take 1 tablet at onset of headache with 400 mg of ibuprofen and a spoonful of peanut butter.  May take second sumatriptan tablet in 2 hours if headache persists. (Patient not taking: Reported on 02/14/2023), Disp: 10 tablet, Rfl: 0   venlafaxine XR (EFFEXOR-XR) 150 MG 24 hr capsule, Take 1 capsule (150 mg total) by mouth every morning., Disp: 90 capsule, Rfl: 1   Allergies  Allergen Reactions   Droperidol Palpitations and Other (See Comments)    Other Reaction(s): Not available    Elevates BP increases HR Elevates BP increases HR    Elevates BP increases HR   Ondansetron Rash, Swelling and Hives    Spots around iv site  IV zofran  Other Reaction(s): Not available, Other (See Comments)    Spots around iv site IV zofran Spots around iv site IV zofran Spots around iv site    Spots around iv site IV zofran    Spots around iv site   Zofran [Ondansetron Hcl] Hives and Other (See Comments)    Spots around iv site   Benzonatate Other (See Comments) and Rash    Conjunctivitis, watery eyes.  Other Reaction(s): Not available, Other (See Comments)    Watery eyes Vision changes Caused burning pain in eyes  Watery eyes Vision changes Conjunctivitis, watery eyes. Conjunctivitis, watery eyes.    Caused burning pain in eyes Watery eyes Vision changes Conjunctivitis, watery eyes.    Watery eyes    Vision changes    Conjunctivitis, watery eyes.   Bromfed Dm [Pseudoeph-Bromphen-Dm] Other (See Comments)    anxiety   Diclofenac Sodium Rash   Flexeril [Cyclobenzaprine] Anxiety   Morphine Itching   Morphine And Codeine Itching    Past Medical History:  Diagnosis Date   Arthritis    "knees" (09/06/2017)   Bullous pemphigus    Cat  bite 06/2014   to left elbow   History of blood transfusion 1988   "when I had my baby"   Muscle weakness of lower extremity 2001; 09/05/2017   "resolved after a couple weeks; ?" (09/06/2017)   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HCC)    Poisoning, snake bite 04/08/2016   "[place]; RUE"   PONV (postoperative nausea and vomiting)    S/P right knee surgery    Situational anxiety    Staph infection ~ 2015   "left elbow and finger"     Past Surgical History:  Procedure Laterality Date   APPENDECTOMY  ~ 1987   APPLICATION OF A-CELL OF EXTREMITY Left 08/05/2015   Procedure: APPLICATION OF A-CELL OF EXTREMITY;  Surgeon: Peggye Form, DO;  Location: [facility];  Service: Plastics;  Laterality: Left;   BREAST SURGERY Right 1990   "milk duct taken out"   CHONDROPLASTY Right 02/19/2019   Procedure: CHONDROPLASTY; EXCISION EXOSTOSIS;  Surgeon: Valeria Batman, MD;  Location: [facility];  Service: Orthopedics;  Laterality: Right;   DEBRIDEMENT AND CLOSURE WOUND Left 07/01/2015   Procedure: LEFT ELBOW EXCISION OF WOUND WITH PRIMARY CLOSURE 2X5 CM ;  Surgeon: Peggye Form, DO;  Location: [facility];  Service: Plastics;  Laterality: Left;   ELBOW SURGERY Left X 23 in [place] <06/2015   from a cat bite; all I&D   I & D EXTREMITY Left 07/08/2015   Procedure: IRRIGATION AND DEBRIDEMENT EXTREMITY, DRAINAGE OF LEFT ARM WOUND, A-CELL PLACEMENT, WOUND VAC PLACEMENT;  Surgeon: Alena Bills Dillingham, DO;  Location: [facility];  Service: Plastics;  Laterality: Left;   INCISION AND DRAINAGE OF WOUND Left 08/05/2015   Procedure: IRRIGATION AND DEBRIDEMENT LEFT ELBOW WOUND, PLACEMENT OF ACELL;  Surgeon: Peggye Form, DO;  Location: [facility];  Service: Plastics;  Laterality: Left;   KNEE ARTHROSCOPY WITH MEDIAL MENISECTOMY Right 02/19/2019   Procedure: RIGHT KNEE ARTHROSCOPY, DEBRIDEMENT, PARTIAL MEDIAL AND LATERAL MENISECTOMY;  Surgeon:  Valeria Batman, MD;  Location:  SURGERY CENTER;  Service: Orthopedics;  Laterality: Right;   LAPAROSCOPIC CHOLECYSTECTOMY  1998   SKIN GRAFT Left 2016   took from anterior thigh; placed at elbow   TONSILLECTOMY  ~ 2000   TOTAL ABDOMINAL HYSTERECTOMY  2003   WRIST SURGERY Right 01/2016    Family History  Problem Relation Age of Onset   Liver disease Mother    Dementia Mother    Cirrhosis Mother    Prostate cancer Father    Colon cancer Maternal Grandmother    Ovarian cancer Maternal Aunt     Social History   Tobacco Use   Smoking status: Never   Smokeless tobacco: Never  Vaping Use   Vaping Use: Never used  Substance Use Topics   Alcohol use: Not Currently    Alcohol/week: 0.0 standard drinks of alcohol   Drug use: No  ROS   Objective:   Vitals: BP 109/77 (BP Location: Right Arm)   Pulse 97   Temp 97.8 F (36.6 C) (Oral)   Resp 16   SpO2 97%   Physical Exam Constitutional:      General: She is not in acute distress.    Appearance: Normal appearance. She is well-developed. She is not ill-appearing, toxic-appearing or diaphoretic.  HENT:     Head: Normocephalic and atraumatic.     Nose: Nose normal.     Mouth/Throat:     Mouth: Mucous membranes are moist.  Eyes:     General: No scleral icterus.       Right eye: No discharge.        Left eye: No discharge.     Extraocular Movements: Extraocular movements intact.  Cardiovascular:     Rate and Rhythm: Normal rate and regular rhythm.     Heart sounds: Normal heart sounds. No murmur heard.    No friction rub. No gallop.  Pulmonary:     Effort: Pulmonary effort is normal. No respiratory distress.     Breath sounds: No stridor. No wheezing, rhonchi or rales.  Chest:     Chest wall: No tenderness.  Skin:    General: Skin is warm and dry.  Neurological:     General: No focal deficit present.     Mental Status: She is alert and oriented to person, place, and time.  Psychiatric:        Mood  and Affect: Mood normal.        Behavior: Behavior normal.     Assessment and Plan :   PDMP not reviewed this encounter.  1. Chronic cough   2. History of COVID-19    Patient has an acute on chronic cough.  Offered chest x-ray but she declined for now.  She does have a clear cardiopulmonary exam but recommended she return to the clinic if her symptoms persist and/or worsen.  Otherwise she plans on following up with her PCP and pulmonologist from before.  In the meantime, can use promethazine cough syrup.  Maintain all other regular medications including the Breo Ellipta, allergy medications. Counseled patient on potential for adverse effects with medications prescribed/recommended today, ER and return-to-clinic precautions discussed, patient verbalized understanding.    Wallis Bamberg, [place] 02/17/23 1036

## 2023-02-17 NOTE — ED Triage Notes (Signed)
Pt reports on and off cough since 2020 after she had COVID. Cough is worse at night. Mucinex and Robitussin gives no relief. Pt states she wants to stop coughing at night.

## 2023-02-20 ENCOUNTER — Telehealth: Payer: BLUE CROSS/BLUE SHIELD | Admitting: Physician Assistant

## 2023-02-20 DIAGNOSIS — R053 Chronic cough: Secondary | ICD-10-CM

## 2023-02-20 NOTE — Progress Notes (Signed)
Because this is a chronic issue and there have been many visits recently, I feel your condition warrants further evaluation and I recommend that you be seen for a face to face visit.  Please contact your primary care physician practice to be seen. Many offices offer virtual options to be seen via video if you are not comfortable going in person to a medical facility at this time.  NOTE: You will NOT be charged for this eVisit.  If you do not have a PCP, [facility] offers a free physician referral service available at [phone]. Our trained staff has the experience, knowledge and resources to put you in touch with a physician who is right for you.    If you are having a true medical emergency please call 911.   Your e-visit answers were reviewed by a board certified advanced clinical practitioner to complete your personal care plan.  Thank you for using e-Visits.  I have spent 5 minutes in review of e-visit questionnaire, review and updating patient chart, medical decision making and response to patient.   Margaretann Loveless, PA-C

## 2023-03-20 ENCOUNTER — Telehealth (HOSPITAL_COMMUNITY): Payer: Self-pay

## 2023-03-20 NOTE — Telephone Encounter (Signed)
Patient is calling to see if you can increase her diazepam back up to three a day - she is going through a lot and having some family issues and really needs the extra tab daily. Please review and advise, thank you

## 2023-03-21 ENCOUNTER — Other Ambulatory Visit (HOSPITAL_COMMUNITY): Payer: Self-pay | Admitting: Psychiatry

## 2023-03-21 MED ORDER — DIAZEPAM 10 MG PO TABS: ORAL_TABLET | ORAL | 3 refills | Status: DC

## 2023-03-23 ENCOUNTER — Ambulatory Visit
Admission: EM | Admit: 2023-03-23 | Discharge: 2023-03-23 | Disposition: A | Discharge status: Home / Self Care | Payer: BLUE CROSS/BLUE SHIELD

## 2023-03-23 DIAGNOSIS — L03114 Cellulitis of left upper limb: Secondary | ICD-10-CM

## 2023-03-23 NOTE — ED Provider Notes (Signed)
[facility]    CSN: [id number] Arrival date & time: 03/23/23  1623      History   Chief Complaint No chief complaint on file.   HPI Sabrina [name] is a 57 y.o. female.   Patient presents today with wound check.  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  Reports the blade stabbed her through the hand.  She was initially seen at an alternate urgent care where she was treated with Keflex and Tdap was updated.  She was also given a prescription for hydrocodone which she states is not helping with the pain at all.  She also endorses low-grade fevers at home, Tmax 100.3 F.  Reports she has redness and swelling going up the arm and is becoming concerned of worsening infection.  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  Has been taking the Keflex as prescribed.  No drainage from the wounds.    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HCC)    Poisoning, snake bite 04/08/2016   "[place]; RUE"   PONV (postoperative nausea and vomiting)    S/P right knee surgery    Situational anxiety    Staph infection ~ 2015   "left elbow and finger"    Patient Active Problem List   Diagnosis Date Noted   Primary osteoarthritis of left knee 06/16/2020   Effusion, left knee 06/16/2020   Loose body in knee, left knee 06/16/2020   Vitamin D deficiency 03/24/2020   Hyperlipidemia 03/24/2020   Pain in left foot 01/21/2020   Primary osteoarthritis of right knee 08/06/2019   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   Other meniscus derangements, posterior  horn of medial meniscus, left knee 02/19/2019   Bucket handle tear of lateral meniscus 02/19/2019   Unilateral primary osteoarthritis, right knee 12/13/2018   Panic disorder 12/13/2018   Chronic headaches 12/13/2018   Weakness 09/06/2017   Depression 09/06/2017   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   Generalized anxiety disorder with panic attacks 09/06/2017   Right hand pain    Wrist swelling    Cellulitis of right upper extremity 04/11/2016   Elbow pain 07/20/2015   Anxiety 07/20/2015   Tachycardia 07/04/2015   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HCC)    Skin ulcer of upper arm, limited to breakdown of skin (HCC) 07/01/2015    Past Surgical History:  Procedure Laterality Date   APPENDECTOMY  ~ 1987   APPLICATION OF A-CELL OF EXTREMITY Left 08/05/2015   Procedure: APPLICATION OF A-CELL OF EXTREMITY;  Surgeon: Peggye Form, DO;  Location: [facility];  Service: Plastics;  Laterality: Left;   BREAST SURGERY Right 1990   "milk duct taken out"   CHONDROPLASTY Right 02/19/2019   Procedure: CHONDROPLASTY; EXCISION EXOSTOSIS;  Surgeon: Valeria Batman, MD;  Location: [facility];  Service: Orthopedics;  Laterality: Right;   DEBRIDEMENT AND CLOSURE WOUND Left 07/01/2015   Procedure: LEFT ELBOW EXCISION OF WOUND WITH PRIMARY CLOSURE 2X5 CM ;  Surgeon: Peggye Form, DO;  Location: [facility];  Service: Plastics;  Laterality: Left;   ELBOW SURGERY Left X 23 in [place] <06/2015   from a cat bite; all I&D   I & D EXTREMITY Left 07/08/2015   Procedure: IRRIGATION AND DEBRIDEMENT EXTREMITY, DRAINAGE OF LEFT ARM WOUND, A-CELL PLACEMENT, WOUND VAC PLACEMENT;  Surgeon: Alena Bills Dillingham, DO;  Location: [facility];  Service: Plastics;  Laterality: Left;   INCISION AND DRAINAGE OF WOUND Left 08/05/2015   Procedure: IRRIGATION AND DEBRIDEMENT LEFT ELBOW WOUND, PLACEMENT OF ACELL;  Surgeon: Peggye Form, DO;  Location: [facility];  Service:  Plastics;  Laterality: Left;   KNEE ARTHROSCOPY WITH MEDIAL MENISECTOMY Right 02/19/2019   Procedure: RIGHT KNEE ARTHROSCOPY, DEBRIDEMENT, PARTIAL MEDIAL AND LATERAL MENISECTOMY;  Surgeon: Valeria Batman, MD;  Location: [facility];  Service: Orthopedics;  Laterality: Right;   LAPAROSCOPIC CHOLECYSTECTOMY  1998   SKIN GRAFT Left 2016   took from anterior thigh; placed at elbow   TONSILLECTOMY  ~ 2000   TOTAL ABDOMINAL HYSTERECTOMY  2003   WRIST SURGERY Right 01/2016    OB History   No obstetric history on file.      Home Medications    Prior to Admission medications   Medication Sig Start Date End Date Taking? Authorizing Provider  albuterol (VENTOLIN HFA) 108 (90 Base) MCG/ACT inhaler Inhale 2 puffs into the lungs every 6 (six) hours as needed for wheezing or shortness of breath (Cough). 01/13/22   Theadora Rama Scales, PA-C  atorvastatin (LIPITOR) 20 MG tablet TAKE 1 TABLET BY MOUTH ONCE A DAY 03/31/21 03/31/22  Philip Aspen, Limmie Patricia, MD  BREO ELLIPTA 100-25 MCG/ACT AEPB Inhale 1 puff into the lungs daily. 04/18/22   Daphine Deutscher Mary-Margaret, FNP  butalbital-acetaminophen-caffeine (FIORICET) [phone] MG tablet TAKE ONE TABLET BY MOUTH EVERY 6 HOURS AS NEEDED FOR HEADACHE 05/02/22   Everlena Cooper, Adam R, DO  diazepam (VALIUM) 10 MG tablet 1 qam  2 qhs 03/21/23   Plovsky, Earvin Hansen, MD  diazepam (VALIUM) 5 MG/ML solution Take by mouth every 8 (eight) hours as needed for anxiety.    [provider]  gabapentin (NEURONTIN) 300 MG capsule Take 2 capsules (600 mg total) by mouth 3 (three) times daily. 08/10/21   Philip Aspen, Limmie Patricia, MD  promethazine-dextromethorphan (PROMETHAZINE-DM) 6.25-15 MG/5ML syrup Take 5 mLs by mouth at bedtime as needed for cough. 02/17/23   Wallis Bamberg, PA-C  rosuvastatin (CRESTOR) 20 MG tablet Take 1 tablet by mouth daily. 06/08/22   [provider]  SUMAtriptan (IMITREX) 50 MG tablet Take 1 tablet at onset of headache with 400 mg of  ibuprofen and a spoonful of peanut butter.  May take second sumatriptan tablet in 2 hours if headache persists. Patient not taking: Reported on 02/14/2023 09/24/21   Theadora Rama Scales, PA-C  venlafaxine XR (EFFEXOR-XR) 150 MG 24 hr capsule Take 1 capsule (150 mg total) by mouth every morning. 02/14/23 02/14/24  Archer Asa, MD    Family History Family History  Problem Relation Age of Onset   Liver disease Mother    Dementia Mother    Cirrhosis Mother    Prostate cancer Father    Colon cancer Maternal Grandmother    Ovarian cancer Maternal Aunt     Social History Social History   Tobacco Use   Smoking status: Never   Smokeless tobacco: Never  Vaping Use   Vaping status: Never Used  Substance Use Topics  Alcohol use: Not Currently    Alcohol/week: 0.0 standard drinks of alcohol   Drug use: No     Allergies   Droperidol, Ondansetron, Zofran [ondansetron hcl], Benzonatate, Bromfed dm [pseudoeph-bromphen-dm], Diclofenac sodium, Flexeril [cyclobenzaprine], Morphine, and Morphine and codeine   Review of Systems Review of Systems Per HPI  Physical Exam Triage Vital Signs ED Triage Vitals  Encounter Vitals Group     BP 03/23/23 1628 114/72     Systolic BP Percentile --      Diastolic BP Percentile --      Pulse Rate 03/23/23 1628 93     Resp 03/23/23 1628 18     Temp 03/23/23 1628 97.9 F (36.6 C)     Temp Source 03/23/23 1628 Oral     SpO2 03/23/23 1628 99 %     Weight --      Height --      Head Circumference --      Peak Flow --      Pain Score 03/23/23 1635 7     Pain Loc --      Pain Education --      Exclude from Growth Chart --    No data found.  Updated Vital Signs BP 114/72 (BP Location: Right Arm)   Pulse 93   Temp 97.9 F (36.6 C) (Oral)   Resp 18   SpO2 99%   Visual Acuity Right Eye Distance:   Left Eye Distance:   Bilateral Distance:    Right Eye Near:   Left Eye Near:    Bilateral Near:     Physical Exam Vitals and nursing note  reviewed.  Constitutional:      General: She is not in acute distress.    Appearance: Normal appearance. She is not toxic-appearing.  HENT:     Head: Normocephalic and atraumatic.     Mouth/Throat:     Mouth: Mucous membranes are moist.     Pharynx: Oropharynx is clear.  Pulmonary:     Effort: Pulmonary effort is normal. No respiratory distress.  Skin:    General: Skin is warm and dry.     Capillary Refill: Capillary refill takes less than 2 seconds.     Coloration: Skin is not jaundiced or pale.     Findings: Erythema and rash present.     Comments: Left upper extremity is erythematous from the level of the wrist up to the forearm anteriorly; there is also petechiae within the erythema anteriorly.  No active drainage.  Area is tender to touch.  Neurological:     Mental Status: She is alert and oriented to person, place, and time.  Psychiatric:        Behavior: Behavior is cooperative.      UC Treatments / Results  Labs (all labs ordered are listed, but only abnormal results are displayed) Labs Reviewed - No data to display  EKG   Radiology No results found.  Procedures Procedures (including critical care time)  Medications Ordered in UC Medications - No data to display  Initial Impression / Assessment and Plan / UC Course  I have reviewed the triage vital signs and the nursing notes.  Pertinent labs & imaging results that were available during my care of the patient were reviewed by me and considered in my medical decision making (see chart for details).   Patient is well-appearing, normotensive, afebrile, not tachycardic, not tachypneic, oxygenating well on room air.    1. Cellulitis of left upper extremity Concern for  worsening cellulitis, failed outpatient treatment She has taken hydrocodone without any improvement in pain I recommended further evaluation in emergency room Patient is in agreement to plan; she is safe to transport via private vehicle at this  time  The patient was given the opportunity to ask questions.  All questions answered to their satisfaction.  The patient is in agreement to this plan.    Final Clinical Impressions(s) / UC Diagnoses   Final diagnoses:  Cellulitis of left upper extremity     Discharge Instructions      Please go directly to the ER for further evaluation and of your left hand wound.    ED Prescriptions   None    I have reviewed the PDMP during this encounter.   Valentino Nose, NP 03/23/23 1714

## 2023-03-23 NOTE — Discharge Instructions (Addendum)
Please go directly to the ER for further evaluation and of your left hand wound.

## 2023-03-23 NOTE — ED Notes (Signed)
Patient is being discharged from the Urgent Care and sent to the Emergency Department via private vehicle . Per NP, patient is in need of higher level of care due to cellulitis. Patient is aware and verbalizes understanding of plan of care.  Vitals:   03/23/23 1628  BP: 114/72  Pulse: 93  Resp: 18  Temp: 97.9 F (36.6 C)  SpO2: 99%

## 2023-03-23 NOTE — ED Triage Notes (Signed)
Pt reports she was seen yesterday at [facility] for a puncture wound to her left hand from a blade at her job at [employer].    Pt states when she woke up today her left forearm is now red, painful, and warm to the touch. States she had fevers on and off today. Managed with tylenol.   Pt did not receive an x ray. Pt last tetanus was 2021

## 2023-06-21 ENCOUNTER — Ambulatory Visit (HOSPITAL_COMMUNITY): Payer: BLUE CROSS/BLUE SHIELD | Admitting: Psychiatry

## 2023-07-12 ENCOUNTER — Ambulatory Visit (HOSPITAL_BASED_OUTPATIENT_CLINIC_OR_DEPARTMENT_OTHER): Payer: BLUE CROSS/BLUE SHIELD | Admitting: Psychiatry

## 2023-07-12 ENCOUNTER — Encounter (HOSPITAL_COMMUNITY): Payer: Self-pay | Admitting: Psychiatry

## 2023-07-12 ENCOUNTER — Other Ambulatory Visit: Payer: Self-pay

## 2023-07-12 VITALS — BP 143/90 | HR 90 | Ht 64.0 in | Wt 174.0 lb

## 2023-07-12 DIAGNOSIS — F329 Major depressive disorder, single episode, unspecified: Secondary | ICD-10-CM | POA: Diagnosis not present

## 2023-07-12 MED ORDER — DIAZEPAM 10 MG PO TABS: ORAL_TABLET | ORAL | 3 refills | Status: DC

## 2023-07-12 MED ORDER — VENLAFAXINE HCL ER 150 MG PO CP24: 150.0000 mg | ORAL_CAPSULE | Freq: Every morning | ORAL | 1 refills | Status: DC

## 2023-07-12 NOTE — Progress Notes (Signed)
This is used to review the EEG "BH MD/PA/NP OP Progress Note  07/12/2023 3:22 PM [name]  MRN:  [id number]  Chief Complaint:       Today the patient is seen in the office.  She is not quite her baseline.  She admits that for the last month or 2 she has been feeling dysphoric.  He is not of it affecting her functioning that although she finds herself having to push herself to get going.  This is unlike her.  Normally she is quite spontaneous and energized.  She is sleeping and eating pretty well.  She is not suicidal.  She drinks no alcohol and uses no drugs.  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  The patient has taken her 2 horses and her dogs with her.  She says she really likes the environment.  She however comments that she feels that she is isolating herself.  It is not that she is actually isolated because she has friends who live out of the same area where she lives.  For the holiday she will likely go to her cousins coming for Thanksgiving or that she will be also be seeing her daughter and granddaughter.  Her granddaughter is 81 years of age.  She is looking forward to seeing her granddaughter.  Noted also is the patient has an autoimmune dermatological condition.  She is seeing a specialist in [employer].  The disease makes her sick and makes dermatological changes that bother her.  She has had this condition for a long time but I suspect it is worse than usual. Virtual Visit via Telephone Note  I connected with Sabrina [name] on 07/12/23 at  3:00 PM EDT by telephone and verified that I am speaking with the correct person using two identifiers.  Location: Patient: home Provider: office   I discussed the limitations, risks, security and privacy concerns of performing an evaluation and management service by telephone and the availability of in person appointments. I also discussed with the patient that there may be  a patient responsible charge related to this service. The patient expressed understanding and agreed to proceed.     I discussed the assessment and treatment plan with the patient. The patient was provided an opportunity to ask questions and all were answered. The patient agreed with the plan and demonstrated an understanding of the instructions.   The patient was advised to call back or seek an in-person evaluation if the symptoms worsen or if the condition fails to improve as anticipated.  I provided 30 minutes of non-face-to-face time during this encounter.   [name], MD   Virtual Visit via Telephone Note  I connected with Sabrina [name] on 07/12/23 at  3:00 PM EDT by telephone and verified that I am speaking with the correct person using two identifiers.  Location: Patient: home Provider: office   I discussed the limitations, risks, security and privacy concerns of performing an evaluation and management service by telephone and the availability of in person appointments. I also discussed with the patient that there may be a patient responsible charge related to this service. The patient expressed understanding and agreed to proceed.     I discussed the assessment and treatment plan with the patient. The patient was provided an opportunity to ask questions and all were answered. The patient agreed with the plan and demonstrated an understanding of the  instructions.   The patient was advised to call back or seek an in-person evaluation if the symptoms worsen or if the condition fails to improve as anticipated.  I provided 30 minutes of non-face-to-face time during this encounter.   [name], MD          Past Medical History:  Past Medical History:  Diagnosis Date   Arthritis    "knees" (09/06/2017)   Bullous pemphigus    Cat bite 06/2014   to left elbow   History of blood transfusion 1988   "when I had my baby"   Muscle weakness of lower extremity  2001; 09/05/2017   "resolved after a couple weeks; ?" (09/06/2017)   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HCC)    Poisoning, snake bite 04/08/2016   "[place]; RUE"   PONV (postoperative nausea and vomiting)    S/P right knee surgery    Situational anxiety    Staph infection ~ 2015   "left elbow and finger"    Past Surgical History:  Procedure Laterality Date   APPENDECTOMY  ~ 1987   APPLICATION OF A-CELL OF EXTREMITY Left 08/05/2015   Procedure: APPLICATION OF A-CELL OF EXTREMITY;  Surgeon: Peggye Form, DO;  Location: [facility];  Service: Plastics;  Laterality: Left;   BREAST SURGERY Right 1990   "milk duct taken out"   CHONDROPLASTY Right 02/19/2019   Procedure: CHONDROPLASTY; EXCISION EXOSTOSIS;  Surgeon: Valeria Batman, MD;  Location: [facility];  Service: Orthopedics;  Laterality: Right;   DEBRIDEMENT AND CLOSURE WOUND Left 07/01/2015   Procedure: LEFT ELBOW EXCISION OF WOUND WITH PRIMARY CLOSURE 2X5 CM ;  Surgeon: Peggye Form, DO;  Location: [facility];  Service: Plastics;  Laterality: Left;   ELBOW SURGERY Left X 23 in [place] <06/2015   from a cat bite; all I&D   I & D EXTREMITY Left 07/08/2015   Procedure: IRRIGATION AND DEBRIDEMENT EXTREMITY, DRAINAGE OF LEFT ARM WOUND, A-CELL PLACEMENT, WOUND VAC PLACEMENT;  Surgeon: Alena Bills Dillingham, DO;  Location: [facility];  Service: Plastics;  Laterality: Left;   INCISION AND DRAINAGE OF WOUND Left 08/05/2015   Procedure: IRRIGATION AND DEBRIDEMENT LEFT ELBOW WOUND, PLACEMENT OF ACELL;  Surgeon: Peggye Form, DO;  Location: [facility];  Service: Plastics;  Laterality: Left;   KNEE ARTHROSCOPY WITH MEDIAL MENISECTOMY Right 02/19/2019   Procedure: RIGHT KNEE ARTHROSCOPY, DEBRIDEMENT, PARTIAL MEDIAL AND LATERAL MENISECTOMY;  Surgeon: Valeria Batman, MD;  Location: [facility];  Service: Orthopedics;  Laterality: Right;   LAPAROSCOPIC CHOLECYSTECTOMY   1998   SKIN GRAFT Left 2016   took from anterior thigh; placed at elbow   TONSILLECTOMY  ~ 2000   TOTAL ABDOMINAL HYSTERECTOMY  2003   WRIST SURGERY Right 01/2016    Family Psychiatric History: See intake H&P for full details. Reviewed, with no updates at this time.   Family History:  Family History  Problem Relation Age of Onset   Liver disease Mother    Dementia Mother    Cirrhosis Mother    Prostate cancer Father    Colon cancer Maternal Grandmother    Ovarian cancer Maternal Aunt     Social History:  Social History   Socioeconomic History   Marital status: Divorced    Spouse name: Not on file   Number of children: 1   Years of education: Not on file   Highest education level: Associate degree: academic program  Occupational History  Occupation: unemployed  Tobacco Use   Smoking status: Never   Smokeless tobacco: Never  Vaping Use   Vaping status: Never Used  Substance and Sexual Activity   Alcohol use: Not Currently    Alcohol/week: 0.0 standard drinks of alcohol   Drug use: No   Sexual activity: Not on file  Other Topics Concern   Not on file  Social History Narrative   Pateint is right-handed. She lives alone in a single level home. She rarely drinks caffeine. She is limited to exercise due to knee injuries.   Social Determinants of Health   Financial Resource Strain: High Risk (06/05/2022)   [facility] to [facility]12[facility] visits prior to 11/12/2022.   Overall Financial Resource Strain (CARDIA)    Difficulty of Paying Living Expenses: Hard  Food Insecurity: Low Risk  (12/19/2022)   [facility]   Hunger Vital Sign    Worried About Running Out of Food in the Last Year: Never true    Ran Out of Food in the Last Year: Never true  Transportation Needs: No Transportation Needs (12/19/2022)   [facility]   Transportation    In the past 12 months, has lack of reliable transportation kept you from medical appointments, meetings, work or from getting things needed for daily living? : No  Physical Activity: Inactive (06/05/2022)   [facility] prior to 11/12/2022., [facility] visits prior to 11/12/2022.   Exercise Vital Sign    Days of Exercise per Week: 0 days    Minutes of Exercise per Session: 10 min  Stress: Stress Concern Present (06/05/2022)   [facility] prior to 11/12/2022., [facility] visits prior to 11/12/2022.   [employer] of Occupational Health - Occupational Stress Questionnaire    Feeling of Stress : Rather much  Social Connections: Moderately Isolated (06/05/2022)   [facility] from [facility] visits prior to 11/12/2022., [facility] visits prior to 11/12/2022.   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]    Frequency of Communication with Friends and Family: More than three times a week    Frequency of Social Gatherings with Friends and Family: Twice a week    Attends Religious Services: More than 4 times per year    Active Member of [employer] or Organizations: No    Attends Banker Meetings: Patient declined    Marital Status: Divorced    Allergies:  Allergies  Allergen Reactions   Droperidol Palpitations and Other (See Comments)    Other Reaction(s): Not available    Elevates BP increases HR Elevates BP increases HR    Elevates BP increases HR   Ondansetron Rash, Swelling and Hives    Spots around iv site  IV zofran  Other Reaction(s): Not available, Other (See Comments)    Spots around iv site IV zofran Spots around iv site IV zofran Spots around iv site    Spots around iv site IV zofran     Spots around iv site   Zofran [Ondansetron Hcl] Hives and Other (See Comments)    Spots around iv site   Benzonatate Other (See Comments) and Rash    Conjunctivitis, watery eyes.  Other  Reaction(s): Not available, Other (See Comments)    Watery eyes Vision changes Caused burning pain in eyes Watery eyes Vision changes Conjunctivitis, watery eyes. Conjunctivitis, watery eyes.    Caused burning pain in eyes Watery eyes Vision changes Conjunctivitis, watery eyes.    Watery eyes    Vision changes    Conjunctivitis, watery eyes.   Bromfed Dm [Pseudoeph-Bromphen-Dm] Other (See Comments)    anxiety   Diclofenac Sodium Rash   Flexeril [Cyclobenzaprine] Anxiety   Morphine Itching   Morphine And Codeine Itching    Metabolic Disorder Labs: Lab Results  Component Value Date   HGBA1C 5.4 03/20/2020   MPG 108 03/20/2020   MPG  07/05/2015    QUESTIONABLE IDENTIFICATION / INCORRECTLY LABELED SPECIMEN   No results found for: "PROLACTIN" Lab Results  Component Value Date   CHOL 267 (H) 03/20/2020   TRIG 76 03/20/2020   HDL 67 03/20/2020   CHOLHDL 4.0 03/20/2020   VLDL 29 04/27/2015   LDLCALC 182 (H) 03/20/2020   LDLCALC 122 (H) 06/18/2018   Lab Results  Component Value Date   TSH 1.52 03/20/2020   TSH 2.890 06/18/2018    Therapeutic Level Labs: No results found for: "LITHIUM" No results found for: "VALPROATE" No results found for: "CBMZ"  Current Medications: Current Outpatient Medications  Medication Sig Dispense Refill   albuterol (VENTOLIN HFA) 108 (90 Base) MCG/ACT inhaler Inhale 2 puffs into the lungs every 6 (six) hours as needed for wheezing or shortness of breath (Cough). 18 g 1   BREO ELLIPTA 100-25 MCG/ACT AEPB Inhale 1 puff into the lungs daily. 60 each 0   butalbital-acetaminophen-caffeine (FIORICET) 50-325-40 MG tablet TAKE ONE TABLET BY MOUTH EVERY 6 HOURS AS NEEDED FOR HEADACHE 10 tablet 2   diazepam (VALIUM) 5 MG/ML solution Take by mouth every 8 (eight)  hours as needed for anxiety.     gabapentin (NEURONTIN) 300 MG capsule Take 2 capsules (600 mg total) by mouth 3 (three) times daily. 540 capsule 1   promethazine-dextromethorphan (PROMETHAZINE-DM) 6.25-15 MG/5ML syrup Take 5 mLs by mouth at bedtime as needed for cough. 500 mL 0   rosuvastatin (CRESTOR) 20 MG tablet Take 1 tablet by mouth daily.     SUMAtriptan (IMITREX) 50 MG tablet Take 1 tablet at onset of headache with 400 mg of ibuprofen and a spoonful of peanut butter.  May take second sumatriptan tablet in 2 hours if headache persists. 10 tablet 0   atorvastatin (LIPITOR) 20 MG tablet TAKE 1 TABLET BY MOUTH ONCE A DAY 90 tablet 1   diazepam (VALIUM) 10 MG tablet 1 qam  2 qhs 90 tablet 3   venlafaxine XR (EFFEXOR-XR) 150 MG 24 hr capsule Take 1 capsule (150 mg total) by mouth every morning. 90 capsule 1   No current facility-administered medications for this visit.     Musculoskeletal: Strength & Muscle Tone: within normal limits Gait & Station: normal Patient leans: N/A  Psychiatric Specialty Exam: ROS  Blood pressure (!) 143/90, pulse 90, height 5\' 4"  (1.626 m), weight 174 lb (78.9 kg).Body mass index is 29.87 kg/m.  General Appearance: Casual and Well Groomed  Eye Contact:  Good  Speech:  Clear and Coherent  Volume:  Normal  Mood:  Euthymic  Affect:  Congruent  Thought Process:  Goal Directed and Descriptions of Associations: Intact  Orientation:  Full (Time, Place, and Person)  Thought Content: Logical   Suicidal Thoughts:  No  Homicidal Thoughts:  No  Memory:  Immediate;   Fair  Judgement:  Fair  Insight:  Fair  Psychomotor Activity:  Normal  Concentration:  Concentration: Good  Recall:  Good  Fund of Knowledge: Good  Language: Good  Akathisia:  Negative  Handed:  Right  AIMS (if indicated): not done  Assets:  Communication Skills Desire for Improvement Housing Transportation  ADL's:  Intact  Cognition: WNL  Sleep:  Fair   Screenings: PHQ2-9     Flowsheet Row Office Visit from 01/24/2020 in [facility] Video Visit from 12/25/2019 in [facility] Office Visit from 09/21/2018 in [facility] Office Visit from 06/25/2018 in [facility] Office Visit from 06/18/2018 in [facility]  PHQ-2 Total Score 2 0 1 0 0  PHQ-9 Total Score 5 2 -- -- --      Flowsheet Row ED from 03/23/2023 in [facility] ED from 02/17/2023 in [facility] [employer] [facility] ED from 04/15/2022 in [facility] [employer] [facility])  C-SSRS RISK CATEGORY No Risk No Risk No Risk        Assessment and Plan:    This patient's diagnosis is major depression.  She takes 150 mg of Effexor.  The possibility of increasing this will be considered in 6 to 7 weeks and she returns.  Right now she cannot identify any specific psychosocial stressor to explain why she has had this decline.  She says in the last month she has felt really bad and in that she is not motivated and has to push herself.  Her anxiety is reasonably well-controlled on Valium 10 mg twice daily.  She says the Valium has been actually very helpful.  She has the opportunity to take another half during the day if she says it is helpful.  Therefore we will consider the possibility of adjusting her Valium and/or her Effexor.  We will go ahead and see how she does in 6 to 7 weeks perhaps when she gets her dermatological condition better control.  Status of current problems: gradually improving  Labs Ordered: No orders of the defined types were placed in this encounter.   Labs Reviewed: n/a  Collateral Obtained/Records Reviewed: n/a  Plan:  Continue Lexapro 20 mg daily Increase Seroquel to 200 mg nightly Return to clinic in 3-4 months, transfer care to Dr. Liz Beach, MD 07/12/2023, 3:22 PM

## 2023-07-19 ENCOUNTER — Encounter: Payer: Self-pay | Admitting: Emergency Medicine

## 2023-07-19 ENCOUNTER — Ambulatory Visit
Admission: EM | Admit: 2023-07-19 | Discharge: 2023-07-19 | Disposition: A | Discharge status: Home / Self Care | Payer: Medicaid Other

## 2023-07-19 DIAGNOSIS — L120 Bullous pemphigoid: Secondary | ICD-10-CM | POA: Diagnosis not present

## 2023-07-19 DIAGNOSIS — H5789 Other specified disorders of eye and adnexa: Secondary | ICD-10-CM | POA: Diagnosis not present

## 2023-07-19 MED ORDER — ERYTHROMYCIN 5 MG/GM OP OINT
TOPICAL_OINTMENT | OPHTHALMIC | 0 refills | Status: DC
Start: 2023-07-19 — End: 2023-10-26

## 2023-07-19 NOTE — ED Triage Notes (Signed)
Left eye redness and swelling since Friday.  Has areas all over body with blisters that have appears since Friday

## 2023-07-19 NOTE — ED Provider Notes (Signed)
[facility]    CSN: [id number] Arrival date & time: 07/19/23  1539      History   Chief Complaint No chief complaint on file.   HPI Sabrina [name] is a 57 y.o. female.   Patient presenting today with acute on chronic flare of her bullous pemphigoid.  States she started Friday with left eyelid and orbital region blistering, swelling and severe pain.  She still has blisters on other areas of the body but this 1 is new and severe.  She was seen on 07/17/2023 at a different urgent care, given steroid topicals and hydrocodone, Phenergan.  She states she used all of the hydrocodone as the pain has been severe that this was a 5-day supply given.  She has an appointment with her dermatologist tomorrow.  Currently on methotrexate and prednisone through dermatology for this condition.  Denies visual change, fever, new products used.    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HCC)    Poisoning, snake bite 04/08/2016   "[place]; RUE"   PONV (postoperative nausea and vomiting)    S/P right knee surgery    Situational anxiety    Staph infection ~ 2015   "left elbow and finger"    Patient Active Problem List   Diagnosis Date Noted   Primary osteoarthritis of left knee 06/16/2020   Effusion, left knee 06/16/2020   Loose body in knee, left knee 06/16/2020   Vitamin D deficiency 03/24/2020   Hyperlipidemia 03/24/2020   Pain in left foot 01/21/2020   Primary osteoarthritis of right knee 08/06/2019   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   Other meniscus derangements, posterior horn of medial meniscus, left knee 02/19/2019   Bucket handle tear of lateral meniscus 02/19/2019   Unilateral primary  osteoarthritis, right knee 12/13/2018   Panic disorder 12/13/2018   Chronic headaches 12/13/2018   Weakness 09/06/2017   Depression 09/06/2017   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   Generalized anxiety disorder with panic attacks 09/06/2017   Right hand pain    Wrist swelling    Cellulitis of right upper extremity 04/11/2016   Elbow pain 07/20/2015   Anxiety 07/20/2015   Tachycardia 07/04/2015   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HCC)    Skin ulcer of upper arm, limited to breakdown of skin (HCC) 07/01/2015    Past Surgical History:  Procedure Laterality Date   APPENDECTOMY  ~ 1987   APPLICATION OF A-CELL OF EXTREMITY Left 08/05/2015   Procedure: APPLICATION OF A-CELL OF EXTREMITY;  Surgeon: Peggye Form, DO;  Location: [facility];  Service: Plastics;  Laterality: Left;   BREAST SURGERY Right 1990   "milk duct taken out"   CHONDROPLASTY Right 02/19/2019   Procedure: CHONDROPLASTY; EXCISION EXOSTOSIS;  Surgeon: Valeria Batman, MD;  Location: [facility];  Service: Orthopedics;  Laterality: Right;   DEBRIDEMENT AND CLOSURE WOUND Left 07/01/2015   Procedure: LEFT ELBOW EXCISION OF WOUND WITH PRIMARY CLOSURE 2X5 CM ;  Surgeon: Peggye Form, DO;  Location: [facility];  Service: Plastics;  Laterality: Left;   ELBOW SURGERY Left X 23 in [place] <06/2015   from a cat bite; all  I&D   I & D EXTREMITY Left 07/08/2015   Procedure: IRRIGATION AND DEBRIDEMENT EXTREMITY, DRAINAGE OF LEFT ARM WOUND, A-CELL PLACEMENT, WOUND VAC PLACEMENT;  Surgeon: Alena Bills Dillingham, DO;  Location: [facility];  Service: Plastics;  Laterality: Left;   INCISION AND DRAINAGE OF WOUND Left 08/05/2015   Procedure: IRRIGATION AND DEBRIDEMENT LEFT ELBOW WOUND, PLACEMENT OF ACELL;  Surgeon: Peggye Form, DO;  Location: [facility];  Service: Plastics;  Laterality: Left;   KNEE ARTHROSCOPY WITH MEDIAL MENISECTOMY Right 02/19/2019   Procedure: RIGHT KNEE ARTHROSCOPY,  DEBRIDEMENT, PARTIAL MEDIAL AND LATERAL MENISECTOMY;  Surgeon: Valeria Batman, MD;  Location: [facility];  Service: Orthopedics;  Laterality: Right;   LAPAROSCOPIC CHOLECYSTECTOMY  1998   SKIN GRAFT Left 2016   took from anterior thigh; placed at elbow   TONSILLECTOMY  ~ 2000   TOTAL ABDOMINAL HYSTERECTOMY  2003   WRIST SURGERY Right 01/2016    OB History   No obstetric history on file.      Home Medications    Prior to Admission medications   Medication Sig Start Date End Date Taking? Authorizing Provider  erythromycin ophthalmic ointment Place a 1/2 inch ribbon of ointment into the left lower eyelid BID prn. 07/19/23  Yes Particia Nearing, PA-C  folic acid (FOLVITE) 1 MG tablet Take 1 mg by mouth daily.   Yes [provider]  methotrexate (RHEUMATREX) 2.5 MG tablet Take 2.5 mg by mouth once a week. Caution:Chemotherapy. Protect from light.   Yes [provider]  predniSONE (DELTASONE) 20 MG tablet Take 20 mg by mouth daily with breakfast.   Yes [provider]  atorvastatin (LIPITOR) 20 MG tablet TAKE 1 TABLET BY MOUTH ONCE A DAY 03/31/21 03/31/22  Philip Aspen, Limmie Patricia, MD  butalbital-acetaminophen-caffeine (FIORICET) [phone] MG tablet TAKE ONE TABLET BY MOUTH EVERY 6 HOURS AS NEEDED FOR HEADACHE 05/02/22   Everlena Cooper, Adam R, DO  diazepam (VALIUM) 10 MG tablet 1 qam  2 qhs 07/12/23   Plovsky, Earvin Hansen, MD  diazepam (VALIUM) 5 MG/ML solution Take by mouth every 8 (eight) hours as needed for anxiety.    [provider]  gabapentin (NEURONTIN) 300 MG capsule Take 2 capsules (600 mg total) by mouth 3 (three) times daily. 08/10/21   Philip Aspen, Limmie Patricia, MD  rosuvastatin (CRESTOR) 20 MG tablet Take 1 tablet by mouth daily. 06/08/22   [provider]  venlafaxine XR (EFFEXOR-XR) 150 MG 24 hr capsule Take 1 capsule (150 mg total) by mouth every morning. 07/12/23 07/11/24  Archer Asa, MD    Family History Family  History  Problem Relation Age of Onset   Liver disease Mother    Dementia Mother    Cirrhosis Mother    Prostate cancer Father    Colon cancer Maternal Grandmother    Ovarian cancer Maternal Aunt     Social History Social History   Tobacco Use   Smoking status: Never   Smokeless tobacco: Never  Vaping Use   Vaping status: Never Used  Substance Use Topics   Alcohol use: Not Currently    Alcohol/week: 0.0 standard drinks of alcohol   Drug use: No     Allergies   Droperidol, Ondansetron, Zofran [ondansetron hcl], Benzonatate, Bromfed dm [pseudoeph-bromphen-dm], Diclofenac sodium, Flexeril [cyclobenzaprine], Morphine, and Morphine and codeine   Review of Systems Review of Systems Per HPI  Physical Exam Triage Vital Signs ED Triage Vitals  Encounter Vitals Group     BP 07/19/23  1542 125/86     Systolic BP Percentile --      Diastolic BP Percentile --      Pulse Rate 07/19/23 1542 (!) 124     Resp 07/19/23 1542 18     Temp 07/19/23 1542 98.1 F (36.7 C)     Temp Source 07/19/23 1542 Oral     SpO2 07/19/23 1542 95 %     Weight --      Height --      Head Circumference --      Peak Flow --      Pain Score 07/19/23 1544 8     Pain Loc --      Pain Education --      Exclude from Growth Chart --    No data found.  Updated Vital Signs BP 125/86 (BP Location: Right Arm)   Pulse (!) 124   Temp 98.1 F (36.7 C) (Oral)   Resp 18   SpO2 95%   Visual Acuity Right Eye Distance:   Left Eye Distance:   Bilateral Distance:    Right Eye Near:   Left Eye Near:    Bilateral Near:     Physical Exam Vitals and nursing note reviewed.  Constitutional:      Appearance: Normal appearance. She is not ill-appearing.  HENT:     Head: Atraumatic.  Eyes:     Extraocular Movements: Extraocular movements intact.     Pupils: Pupils are equal, round, and reactive to light.  Cardiovascular:     Rate and Rhythm: Normal rate and regular rhythm.     Heart sounds: Normal heart  sounds.  Pulmonary:     Effort: Pulmonary effort is normal.     Breath sounds: Normal breath sounds.  Musculoskeletal:        General: Normal range of motion.     Cervical back: Normal range of motion and neck supple.  Skin:    General: Skin is warm.     Comments: Erythematous edematous blistered region to the left upper eyelid and orbital region.  Neurological:     Mental Status: She is alert and oriented to person, place, and time.  Psychiatric:        Mood and Affect: Mood normal.        Thought Content: Thought content normal.        Judgment: Judgment normal.      UC Treatments / Results  Labs (all labs ordered are listed, but only abnormal results are displayed) Labs Reviewed - No data to display  EKG   Radiology No results found.  Procedures Procedures (including critical care time)  Medications Ordered in UC Medications - No data to display  Initial Impression / Assessment and Plan / UC Course  I have reviewed the triage vital signs and the nursing notes.  Pertinent labs & imaging results that were available during my care of the patient were reviewed by me and considered in my medical decision making (see chart for details).     Will add erythromycin ointment to her current steroid regimen given at another urgent care several days ago.  Will forego prescribing more pain medications today as she was given a 5-day supply, 15 tabs of hydrocodone 2 days ago at this urgent care visit per the PDMP.  Follow-up with dermatology as scheduled tomorrow. Visual acuity declined as vision intact per patient. Final Clinical Impressions(s) / UC Diagnoses   Final diagnoses:  Bullous pemphigoid  Eye irritation  Discharge Instructions      Follow up tomorrow with Dermatology as scheduled    ED Prescriptions     Medication Sig Dispense Auth. Provider   erythromycin ophthalmic ointment Place a 1/2 inch ribbon of ointment into the left lower eyelid BID prn. 3.5 g  Particia Nearing, PA-C      I have reviewed the PDMP during this encounter.   Particia Nearing, [place] 07/20/23 1950

## 2023-07-19 NOTE — Discharge Instructions (Signed)
Follow up tomorrow with Dermatology as scheduled

## 2023-07-20 ENCOUNTER — Encounter (HOSPITAL_COMMUNITY): Payer: Self-pay

## 2023-07-20 ENCOUNTER — Other Ambulatory Visit: Payer: Self-pay

## 2023-07-20 ENCOUNTER — Emergency Department (HOSPITAL_COMMUNITY)
Admission: EM | Admit: 2023-07-20 | Discharge: 2023-07-20 | Discharge status: Against Medical Advice | Payer: BLUE CROSS/BLUE SHIELD | Attending: Emergency Medicine | Admitting: Emergency Medicine

## 2023-07-20 DIAGNOSIS — Z5329 Procedure and treatment not carried out because of patient's decision for other reasons: Secondary | ICD-10-CM | POA: Insufficient documentation

## 2023-07-20 DIAGNOSIS — H5712 Ocular pain, left eye: Secondary | ICD-10-CM | POA: Diagnosis present

## 2023-07-20 NOTE — ED Triage Notes (Signed)
Bullous pemphigoid blisters Flares started 2 months ago  Takes methotrexate once weekly    LEFT eye, LEFT side of neck, LEFT lower ABD blisters

## 2023-07-20 NOTE — ED Notes (Signed)
Pt stated that horse is sick at home Pt requested not to wait for blood work or CT scan Informed PA Pt signed AMA

## 2023-07-20 NOTE — ED Notes (Signed)
Pt requested to leave AMA.  

## 2023-07-20 NOTE — ED Provider Notes (Signed)
[facility] Provider Note   CSN: [id number] Arrival date & time: 07/20/23  1510     History  Chief Complaint  Patient presents with   Blister    Sabrina [name] is a 57 y.o. female.  57 year old female with history of bullous pemphigus, anxiety, HLD, chronic pain, presents with complaint of pain around her left eye from her bullous pemphigoid. Reports flare starting 2 months ago, seen at derm 07/13/23 and started on methotrexate, prednisone. Has been applying silvadene and lidocaine to areas but continues to have a deep severe pain around her left eye primarily as well as left side neck not relieved with her available medications. No other complaints or concerns. Denies trauma to the eye area.        Home Medications Prior to Admission medications   Medication Sig Start Date End Date Taking? Authorizing Provider  atorvastatin (LIPITOR) 20 MG tablet TAKE 1 TABLET BY MOUTH ONCE A DAY 03/31/21 03/31/22  Philip Aspen, Limmie Patricia, MD  butalbital-acetaminophen-caffeine (FIORICET) [phone] MG tablet TAKE ONE TABLET BY MOUTH EVERY 6 HOURS AS NEEDED FOR HEADACHE 05/02/22   Everlena Cooper, Adam R, DO  diazepam (VALIUM) 10 MG tablet 1 qam  2 qhs 07/12/23   Plovsky, Earvin Hansen, MD  diazepam (VALIUM) 5 MG/ML solution Take by mouth every 8 (eight) hours as needed for anxiety.    [provider]  erythromycin ophthalmic ointment Place a 1/2 inch ribbon of ointment into the left lower eyelid BID prn. 07/19/23   Particia Nearing, PA-C  folic acid (FOLVITE) 1 MG tablet Take 1 mg by mouth daily.    [provider]  gabapentin (NEURONTIN) 300 MG capsule Take 2 capsules (600 mg total) by mouth 3 (three) times daily. 08/10/21   Philip Aspen, Limmie Patricia, MD  methotrexate (RHEUMATREX) 2.5 MG tablet Take 2.5 mg by mouth once a week. Caution:Chemotherapy. Protect from light.    [provider]  predniSONE (DELTASONE) 20 MG tablet Take 20 mg by  mouth daily with breakfast.    [provider]  rosuvastatin (CRESTOR) 20 MG tablet Take 1 tablet by mouth daily. 06/08/22   [provider]  venlafaxine XR (EFFEXOR-XR) 150 MG 24 hr capsule Take 1 capsule (150 mg total) by mouth every morning. 07/12/23 07/11/24  Archer Asa, MD      Allergies    Droperidol, Ondansetron, Zofran Frazier Richards hcl], Benzonatate, Bromfed dm [pseudoeph-bromphen-dm], Diclofenac sodium, Flexeril [cyclobenzaprine], Morphine, and Morphine and codeine    Review of Systems   Review of Systems Negative except as per HPI Physical Exam Updated Vital Signs BP 116/85   Pulse 96   Temp 98 F (36.7 C) (Oral)   Resp 16   Ht 5\' 4"  (1.626 m)   Wt 78 kg   SpO2 94%   BMI 29.52 kg/m  Physical Exam Vitals and nursing note reviewed.  Constitutional:      General: She is not in acute distress.    Appearance: She is well-developed. She is not diaphoretic.  HENT:     Head: Normocephalic and atraumatic.  Eyes:     Extraocular Movements: Extraocular movements intact.     Conjunctiva/sclera: Conjunctivae normal.     Pupils: Pupils are equal, round, and reactive to light.  Pulmonary:     Effort: Pulmonary effort is normal.  Musculoskeletal:     Cervical back: Neck supple.  Skin:    General: Skin is warm and dry.     Findings:  Bruising present.     Comments: Single bullae to left flank without evidence of surrounding erythema or secondary infection  Ecchymosis to left orbital area with tenderness, no vesicles/bullae noted   Ulcerated lesion to left lateral neck without evidence of infection   Neurological:     Mental Status: She is alert and oriented to person, place, and time.  Psychiatric:        Behavior: Behavior normal.     ED Results / Procedures / Treatments   Labs (all labs ordered are listed, but only abnormal results are displayed) Labs Reviewed - No data to display   EKG None  Radiology No results  found.  Procedures Procedures    Medications Ordered in ED Medications - No data to display  ED Course/ Medical Decision Making/ A&P                                 Medical Decision Making Amount and/or Complexity of Data Reviewed Labs: ordered. Radiology: ordered.   57 year old female presents the emergency room with concern for severe pain around her left eye area as well as left neck related to her bullous pemphigoid.  Chart reviewed, patient was seen by dermatology October 31, started on methotrexate and provided with Silvadene and prednisone.  She had a telemed visit on November 2 for her pain and was advised to take Tylenol.  She had a telemed visit again on November 3 and another visit to her PCP on November 4.  At her November 4 visit she was prescribed 15 tablets of Norco as well as Phenergan.  She called her dermatologist on November 5 to report her severe pain.  She had a telehealth visit on November 6 with report of ongoing severe pain which was only relieved by the hydrocodone and was told that they were unable to refill narcotics.  She presented to urgent care on November 6 and was not provided with further narcotics.  Patient presents the ER today, offered to evaluate for deeper process with CT scan concerning her severe left eye pain although has extraocular's are intact.  Plan to check labs.  Patient was initially agreeable with plan however ultimately walked out of the ER without further conversation with me, told her nurse that she had a source that was sick and needed to leave.        Final Clinical Impression(s) / ED Diagnoses Final diagnoses:  Left eye pain    Rx / DC Orders ED Discharge Orders     None         Sabrina Fend, PA-C 07/20/23 1701    Jacalyn Lefevre, MD 07/21/23 1554

## 2023-08-14 ENCOUNTER — Encounter (HOSPITAL_COMMUNITY): Payer: Self-pay | Admitting: *Deleted

## 2023-08-14 ENCOUNTER — Other Ambulatory Visit: Payer: Self-pay

## 2023-08-14 ENCOUNTER — Emergency Department (HOSPITAL_COMMUNITY): Payer: BLUE CROSS/BLUE SHIELD

## 2023-08-14 ENCOUNTER — Emergency Department (HOSPITAL_COMMUNITY)
Admission: EM | Admit: 2023-08-14 | Discharge: 2023-08-14 | Disposition: A | Discharge status: Home / Self Care | Payer: BLUE CROSS/BLUE SHIELD | Attending: Student | Admitting: Student

## 2023-08-14 DIAGNOSIS — Z79899 Other long term (current) drug therapy: Secondary | ICD-10-CM | POA: Insufficient documentation

## 2023-08-14 DIAGNOSIS — W19XXXA Unspecified fall, initial encounter: Secondary | ICD-10-CM | POA: Insufficient documentation

## 2023-08-14 DIAGNOSIS — S0083XA Contusion of other part of head, initial encounter: Secondary | ICD-10-CM | POA: Insufficient documentation

## 2023-08-14 DIAGNOSIS — S2221XA Fracture of manubrium, initial encounter for closed fracture: Secondary | ICD-10-CM | POA: Insufficient documentation

## 2023-08-14 DIAGNOSIS — S299XXA Unspecified injury of thorax, initial encounter: Secondary | ICD-10-CM | POA: Diagnosis present

## 2023-08-14 DIAGNOSIS — S46002A Unspecified injury of muscle(s) and tendon(s) of the rotator cuff of left shoulder, initial encounter: Secondary | ICD-10-CM | POA: Insufficient documentation

## 2023-08-14 MED ORDER — ACETAMINOPHEN 500 MG PO TABS: 1000.0000 mg | ORAL_TABLET | Freq: Three times a day (TID) | ORAL | 0 refills | Status: AC

## 2023-08-14 MED ORDER — NAPROXEN 375 MG PO TABS: 375.0000 mg | ORAL_TABLET | Freq: Two times a day (BID) | ORAL | 0 refills | Status: AC

## 2023-08-14 MED ORDER — OXYCODONE HCL 5 MG PO TABS: 5.0000 mg | ORAL_TABLET | ORAL | 0 refills | Status: DC | PRN

## 2023-08-14 MED ORDER — HYDROCODONE-ACETAMINOPHEN 5-325 MG PO TABS
1.0000 | ORAL_TABLET | Freq: Once | ORAL | Status: AC
  Administered 2023-08-14: 1 via ORAL
  Filled 2023-08-14: qty 1

## 2023-08-14 NOTE — ED Triage Notes (Signed)
Pt states she slipped on her car after getting ice out of her horses water trough; pt states she hit her car with her chin and fell to the ground landing on her left shoulder; pt has pain and limited mobility to that arm  Pt also states she is unable to open her jaw completely and states it hurts to take a deep breath

## 2023-08-14 NOTE — ED Triage Notes (Signed)
Pt BIB [facility] for c/o fall; pt has small laceration to chin  122/90 BP  Pt told ems she is unable to open her jaw and pt has deformity to left clavicle  Pt refused C-collar with ems

## 2023-08-14 NOTE — ED Notes (Signed)
Shoulder immobilizer applied and adjusted to patient comfort

## 2023-08-14 NOTE — ED Provider Notes (Signed)
[place] EMERGENCY DEPARTMENT AT [facility] Provider Note  CSN: [id number] Arrival date & time: 08/14/23 4403  Chief Complaint(s) Fall  HPI Sabrina [name] is a 57 y.o. 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  Patient states that she was going to feed the horses this morning when she went to scoop ice out of the water bucket and fell on her left shoulder.  She arrives with a small abrasion under the chin, complaints of inability to fully open the jaw, left shoulder pain, left chest wall pain.  Denies abdominal pain, nausea, vomiting, headache, fever, numbness, ting, weakness or other systemic or neurologic complaints.   Past Medical History 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HCC)    Poisoning, snake bite 04/08/2016   "[place]; RUE"   PONV (postoperative nausea and vomiting)    S/P right knee surgery    Situational anxiety    Staph infection ~ 2015   "left elbow and finger"   Patient Active Problem List   Diagnosis Date Noted   Primary osteoarthritis of left knee 06/16/2020   Effusion, left knee 06/16/2020   Loose body in knee, left knee 06/16/2020   Vitamin D deficiency 03/24/2020   Hyperlipidemia 03/24/2020   Pain in left foot 01/21/2020   Primary osteoarthritis of right knee 08/06/2019   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   Other meniscus derangements, posterior horn of medial meniscus, left knee 02/19/2019   Bucket handle tear of lateral meniscus 02/19/2019   Unilateral primary osteoarthritis, right knee 12/13/2018   Panic  disorder 12/13/2018   Chronic headaches 12/13/2018   Weakness 09/06/2017   Depression 09/06/2017   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   Generalized anxiety disorder with panic attacks 09/06/2017   Right hand pain    Wrist swelling    Cellulitis of right upper extremity 04/11/2016   Elbow pain 07/20/2015   Anxiety 07/20/2015   Tachycardia 07/04/2015   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HCC)    Skin ulcer of upper arm, limited to breakdown of skin (HCC) 07/01/2015   Home Medication(s) Prior to Admission medications   Medication Sig Start Date End Date Taking? Authorizing Provider  acetaminophen (TYLENOL) 500 MG tablet Take 2 tablets (1,000 mg total) by mouth every 8 (eight) hours. 08/14/23 09/13/23 Yes Nishan Ovens, MD  naproxen (NAPROSYN) 375 MG tablet Take 1 tablet (375 mg total) by mouth 2 (two) times daily for 4 days. 08/14/23 08/18/23 Yes Jaclynne Baldo, MD  oxyCODONE (ROXICODONE) 5 MG immediate release tablet Take 1 tablet (5 mg total) by mouth every 4 (four) hours as needed for severe pain (pain score 7-10). 08/14/23  Yes Oluwaseyi Tull, MD  atorvastatin (LIPITOR) 20 MG tablet TAKE 1 TABLET BY MOUTH ONCE A DAY 03/31/21 03/31/22  Philip Aspen, Limmie Patricia, MD  butalbital-acetaminophen-caffeine (FIORICET) [phone] MG tablet TAKE ONE TABLET BY MOUTH EVERY 6 HOURS AS NEEDED FOR HEADACHE 05/02/22   Drema Dallas, DO  diazepam (VALIUM) 10 MG tablet 1 qam  2 qhs 07/12/23   Archer Asa, MD  diazepam (  VALIUM) 5 MG/ML solution Take by mouth every 8 (eight) hours as needed for anxiety.    [provider]  erythromycin ophthalmic ointment Place a 1/2 inch ribbon of ointment into the left lower eyelid BID prn. 07/19/23   Particia Nearing, PA-C  folic acid (FOLVITE) 1 MG tablet Take 1 mg by mouth daily.    [provider]  gabapentin (NEURONTIN) 300 MG capsule Take 2 capsules (600 mg total) by mouth 3 (three) times daily. 08/10/21   Philip Aspen, Limmie Patricia, MD  methotrexate (RHEUMATREX)  2.5 MG tablet Take 2.5 mg by mouth once a week. Caution:Chemotherapy. Protect from light.    [provider]  predniSONE (DELTASONE) 20 MG tablet Take 20 mg by mouth daily with breakfast.    [provider]  rosuvastatin (CRESTOR) 20 MG tablet Take 1 tablet by mouth daily. 06/08/22   [provider]  venlafaxine XR (EFFEXOR-XR) 150 MG 24 hr capsule Take 1 capsule (150 mg total) by mouth every morning. 07/12/23 07/11/24  Archer Asa, MD                                                                                                                                    Past Surgical History Past Surgical History:  Procedure Laterality Date   APPENDECTOMY  ~ 1987   APPLICATION OF A-CELL OF EXTREMITY Left 08/05/2015   Procedure: APPLICATION OF A-CELL OF EXTREMITY;  Surgeon: Peggye Form, DO;  Location: [facility];  Service: Plastics;  Laterality: Left;   BREAST SURGERY Right 1990   "milk duct taken out"   CHONDROPLASTY Right 02/19/2019   Procedure: CHONDROPLASTY; EXCISION EXOSTOSIS;  Surgeon: Valeria Batman, MD;  Location: [facility];  Service: Orthopedics;  Laterality: Right;   DEBRIDEMENT AND CLOSURE WOUND Left 07/01/2015   Procedure: LEFT ELBOW EXCISION OF WOUND WITH PRIMARY CLOSURE 2X5 CM ;  Surgeon: Peggye Form, DO;  Location: [facility];  Service: Plastics;  Laterality: Left;   ELBOW SURGERY Left X 23 in [place] <06/2015   from a cat bite; all I&D   I & D EXTREMITY Left 07/08/2015   Procedure: IRRIGATION AND DEBRIDEMENT EXTREMITY, DRAINAGE OF LEFT ARM WOUND, A-CELL PLACEMENT, WOUND VAC PLACEMENT;  Surgeon: Alena Bills Dillingham, DO;  Location: [facility];  Service: Plastics;  Laterality: Left;   INCISION AND DRAINAGE OF WOUND Left 08/05/2015   Procedure: IRRIGATION AND DEBRIDEMENT LEFT ELBOW WOUND, PLACEMENT OF ACELL;  Surgeon: Peggye Form, DO;  Location: [facility];  Service: Plastics;   Laterality: Left;   KNEE ARTHROSCOPY WITH MEDIAL MENISECTOMY Right 02/19/2019   Procedure: RIGHT KNEE ARTHROSCOPY, DEBRIDEMENT, PARTIAL MEDIAL AND LATERAL MENISECTOMY;  Surgeon: Valeria Batman, MD;  Location: [facility];  Service: Orthopedics;  Laterality: Right;   LAPAROSCOPIC CHOLECYSTECTOMY  1998   SKIN GRAFT Left 2016   took from anterior thigh; placed at  elbow   TONSILLECTOMY  ~ 2000   TOTAL ABDOMINAL HYSTERECTOMY  2003   WRIST SURGERY Right 01/2016   Family History Family History  Problem Relation Age of Onset   Liver disease Mother    Dementia Mother    Cirrhosis Mother    Prostate cancer Father    Colon cancer Maternal Grandmother    Ovarian cancer Maternal Aunt     Social History Social History   Tobacco Use   Smoking status: Never   Smokeless tobacco: Never  Vaping Use   Vaping status: Never Used  Substance Use Topics   Alcohol use: Not Currently    Alcohol/week: 0.0 standard drinks of alcohol   Drug use: No   Allergies Droperidol, Ondansetron, Zofran [ondansetron hcl], Benzonatate, Bromfed dm [pseudoeph-bromphen-dm], Diclofenac sodium, Flexeril [cyclobenzaprine], Morphine, and Morphine and codeine  Review of Systems Review of Systems  HENT:  Positive for facial swelling.   Musculoskeletal:  Positive for arthralgias, joint swelling and myalgias.    Physical Exam Vital Signs  I have reviewed the triage vital signs BP (!) 115/93   Pulse (!) 105   Temp 98.6 F (37 C) (Oral)   Resp (!) 22   Ht 5\' 4"  (1.626 m)   Wt 78 kg   SpO2 99%   BMI 29.52 kg/m   Physical Exam Vitals and nursing note reviewed.  Constitutional:      General: She is not in acute distress.    Appearance: She is well-developed.  HENT:     Head: Normocephalic.     Comments: Abrasion under the chin Eyes:     Conjunctiva/sclera: Conjunctivae normal.  Cardiovascular:     Rate and Rhythm: Normal rate and regular rhythm.     Heart sounds: No murmur  heard. Pulmonary:     Effort: Pulmonary effort is normal. No respiratory distress.     Breath sounds: Normal breath sounds.  Abdominal:     Palpations: Abdomen is soft.     Tenderness: There is no abdominal tenderness.  Musculoskeletal:        General: Tenderness present. No swelling.     Cervical back: Neck supple.  Skin:    General: Skin is warm and dry.     Capillary Refill: Capillary refill takes less than 2 seconds.  Neurological:     Mental Status: She is alert.  Psychiatric:        Mood and Affect: Mood normal.     ED Results and Treatments Labs (all labs ordered are listed, but only abnormal results are displayed) Labs Reviewed - No data to display                                                                                                                        Radiology DG Shoulder Left  Result Date: 08/14/2023 CLINICAL DATA:  57 year old female status post fall. Pain and decreased range of motion. EXAM: LEFT SHOULDER - 2+ VIEW COMPARISON:  Portable chest 09/04/2020. FINDINGS: Three views at  1043 hours. No glenohumeral joint dislocation. Proximal left humerus appears intact. Superior subluxation of the left humeral head since 2021. But the left clavicle and scapula appear intact. Chronic appearing left lateral 2nd rib fracture. Otherwise negative visible left ribs and chest. IMPRESSION: 1. No acute fracture or dislocation identified about the left shoulder. But superior location of the humeral head raises the possibility of rotator cuff pathology. 2. Chronic appearing left lateral 2nd rib fracture. Electronically Signed   By: Odessa Fleming M.D.   On: 08/14/2023 12:07   CT Head Wo Contrast  Result Date: 08/14/2023 CLINICAL DATA:  Head trauma, moderate-severe; Neck trauma, dangerous injury mechanism (Age 55-64y); Facial trauma, blunt. Fall. EXAM: CT HEAD WITHOUT CONTRAST CT MAXILLOFACIAL WITHOUT CONTRAST CT CERVICAL SPINE WITHOUT CONTRAST TECHNIQUE: Multidetector CT imaging of  the head, cervical spine, and maxillofacial structures were performed using the standard protocol without intravenous contrast. Multiplanar CT image reconstructions of the cervical spine and maxillofacial structures were also generated. RADIATION DOSE REDUCTION: This exam was performed according to the departmental dose-optimization program which includes automated exposure control, adjustment of the mA and/or kV according to patient size and/or use of iterative reconstruction technique. COMPARISON:  Head CT 08/05/2020 FINDINGS: CT HEAD FINDINGS Brain: There is no evidence of an acute infarct, intracranial hemorrhage, mass, midline shift, or extra-axial fluid collection. The ventricles and sulci are normal. Vascular: No hyperdense vessel. Skull: No acute fracture or suspicious osseous lesion. Other: None. CT MAXILLOFACIAL FINDINGS Osseous: No acute fracture or mandibular dislocation. Orbits: Unremarkable. Sinuses: Minimal mucosal thickening in the maxillary sinuses. No sinus fluid. Clear mastoid air cells. Soft tissues: Unremarkable. CT CERVICAL SPINE FINDINGS Alignment: Straightening of the normal cervical lordosis. Trace anterolisthesis of C3 on C4 and C4 on C5. Skull base and vertebrae: No acute fracture or suspicious osseous lesion. Soft tissues and spinal canal: No prevertebral fluid or swelling. No visible canal hematoma. Disc levels: Mild cervical spondylosis, most notable at C5-6. No evidence of high-grade spinal canal or neural foraminal stenosis. Upper chest: Reported separately. Other: None. IMPRESSION: 1. No evidence of acute intracranial abnormality. 2. No acute maxillofacial or cervical spine fracture. Electronically Signed   By: Sebastian Ache M.D.   On: 08/14/2023 11:22   CT Maxillofacial Wo Contrast  Result Date: 08/14/2023 CLINICAL DATA:  Head trauma, moderate-severe; Neck trauma, dangerous injury mechanism (Age 12-64y); Facial trauma, blunt. Fall. EXAM: CT HEAD WITHOUT CONTRAST CT MAXILLOFACIAL  WITHOUT CONTRAST CT CERVICAL SPINE WITHOUT CONTRAST TECHNIQUE: Multidetector CT imaging of the head, cervical spine, and maxillofacial structures were performed using the standard protocol without intravenous contrast. Multiplanar CT image reconstructions of the cervical spine and maxillofacial structures were also generated. RADIATION DOSE REDUCTION: This exam was performed according to the departmental dose-optimization program which includes automated exposure control, adjustment of the mA and/or kV according to patient size and/or use of iterative reconstruction technique. COMPARISON:  Head CT 08/05/2020 FINDINGS: CT HEAD FINDINGS Brain: There is no evidence of an acute infarct, intracranial hemorrhage, mass, midline shift, or extra-axial fluid collection. The ventricles and sulci are normal. Vascular: No hyperdense vessel. Skull: No acute fracture or suspicious osseous lesion. Other: None. CT MAXILLOFACIAL FINDINGS Osseous: No acute fracture or mandibular dislocation. Orbits: Unremarkable. Sinuses: Minimal mucosal thickening in the maxillary sinuses. No sinus fluid. Clear mastoid air cells. Soft tissues: Unremarkable. CT CERVICAL SPINE FINDINGS Alignment: Straightening of the normal cervical lordosis. Trace anterolisthesis of C3 on C4 and C4 on C5. Skull base and vertebrae: No acute fracture or suspicious osseous lesion. Soft  tissues and spinal canal: No prevertebral fluid or swelling. No visible canal hematoma. Disc levels: Mild cervical spondylosis, most notable at C5-6. No evidence of high-grade spinal canal or neural foraminal stenosis. Upper chest: Reported separately. Other: None. IMPRESSION: 1. No evidence of acute intracranial abnormality. 2. No acute maxillofacial or cervical spine fracture. Electronically Signed   By: Sebastian Ache M.D.   On: 08/14/2023 11:22   CT Cervical Spine Wo Contrast  Result Date: 08/14/2023 CLINICAL DATA:  Head trauma, moderate-severe; Neck trauma, dangerous injury  mechanism (Age 14-64y); Facial trauma, blunt. Fall. EXAM: CT HEAD WITHOUT CONTRAST CT MAXILLOFACIAL WITHOUT CONTRAST CT CERVICAL SPINE WITHOUT CONTRAST TECHNIQUE: Multidetector CT imaging of the head, cervical spine, and maxillofacial structures were performed using the standard protocol without intravenous contrast. Multiplanar CT image reconstructions of the cervical spine and maxillofacial structures were also generated. RADIATION DOSE REDUCTION: This exam was performed according to the departmental dose-optimization program which includes automated exposure control, adjustment of the mA and/or kV according to patient size and/or use of iterative reconstruction technique. COMPARISON:  Head CT 08/05/2020 FINDINGS: CT HEAD FINDINGS Brain: There is no evidence of an acute infarct, intracranial hemorrhage, mass, midline shift, or extra-axial fluid collection. The ventricles and sulci are normal. Vascular: No hyperdense vessel. Skull: No acute fracture or suspicious osseous lesion. Other: None. CT MAXILLOFACIAL FINDINGS Osseous: No acute fracture or mandibular dislocation. Orbits: Unremarkable. Sinuses: Minimal mucosal thickening in the maxillary sinuses. No sinus fluid. Clear mastoid air cells. Soft tissues: Unremarkable. CT CERVICAL SPINE FINDINGS Alignment: Straightening of the normal cervical lordosis. Trace anterolisthesis of C3 on C4 and C4 on C5. Skull base and vertebrae: No acute fracture or suspicious osseous lesion. Soft tissues and spinal canal: No prevertebral fluid or swelling. No visible canal hematoma. Disc levels: Mild cervical spondylosis, most notable at C5-6. No evidence of high-grade spinal canal or neural foraminal stenosis. Upper chest: Reported separately. Other: None. IMPRESSION: 1. No evidence of acute intracranial abnormality. 2. No acute maxillofacial or cervical spine fracture. Electronically Signed   By: Sebastian Ache M.D.   On: 08/14/2023 11:22    Pertinent labs & imaging results that  were available during my care of the patient were reviewed by me and considered in my medical decision making (see MDM for details).  Medications Ordered in ED Medications  HYDROcodone-acetaminophen (NORCO/VICODIN) 5-325 MG per tablet 1 tablet (1 tablet Oral Given 08/14/23 1302)                                                                                                                                     Procedures Procedures  (including critical care time)  Medical Decision Making / ED Course   This patient presents to the ED for concern of fall, jaw pain, shoulder pain, this involves an extensive number of treatment options, and is a complaint that carries with it a high risk of complications and morbidity.  The differential  diagnosis includes fracture, contusion, hematoma, ligamentous injury, closed head injury, ICH, laceration, intrathoracic injury, intra-abdominal injury  MDM: Patient seen emergency room for evaluation of a fall.  Physical exam with an abrasion under the chin, tenderness over the left shoulder, manubrial tenderness but is otherwise unremarkable.  Neurologic exam unremarkable with no focal motor or sensory deficits.  CT imaging overall reassuring outside of a subacute manubrial fracture.  She states that this likely occurred 3 weeks ago when she was backed against a wall by her horse.  X-ray of the shoulder with possible rotator cuff injury but no fracture.  Patient placed in a sling and pain controlled.  Patient has follow-up with her orthopedic surgeon in 48 hours for a different complaint and will follow-up about her shoulder with this individual.  At this time with overall negative trauma workup patient does not meet inpatient criteria for admission and will be discharged with outpatient follow-up.  Return precautions given which she voiced understanding   Additional history obtained:  -External records from outside source obtained and reviewed including: Chart  review including previous notes, labs, imaging, consultation notes   Lab Tests: -I ordered, reviewed, and interpreted labs.   The pertinent results include:   Labs Reviewed - No data to display      Imaging Studies ordered: I ordered imaging studies including CT head, C-spine, max face, x-ray shoulder, CT chest I independently visualized and interpreted imaging. I agree with the radiologist interpretation   Medicines ordered and prescription drug management: Meds ordered this encounter  Medications   HYDROcodone-acetaminophen (NORCO/VICODIN) 5-325 MG per tablet 1 tablet   naproxen (NAPROSYN) 375 MG tablet    Sig: Take 1 tablet (375 mg total) by mouth 2 (two) times daily for 4 days.    Dispense:  8 tablet    Refill:  0   acetaminophen (TYLENOL) 500 MG tablet    Sig: Take 2 tablets (1,000 mg total) by mouth every 8 (eight) hours.    Dispense:  180 tablet    Refill:  0   oxyCODONE (ROXICODONE) 5 MG immediate release tablet    Sig: Take 1 tablet (5 mg total) by mouth every 4 (four) hours as needed for severe pain (pain score 7-10).    Dispense:  10 tablet    Refill:  0    -I have reviewed the patients home medicines and have made adjustments as needed  Critical interventions none   Cardiac Monitoring: The patient was maintained on a cardiac monitor.  I personally viewed and interpreted the cardiac monitored which showed an underlying rhythm of: NSR  Social Determinants of Health:  Factors impacting patients care include: Patient works as a Fish farm manager   Reevaluation: After the interventions noted above, I reevaluated the patient and found that they have :improved  Co morbidities that complicate the patient evaluation  Past Medical History:  Diagnosis Date   Arthritis    "knees" (09/06/2017)   Bullous pemphigus    Cat bite 06/2014   to left elbow   History of blood transfusion 1988   "when I had my baby"   Muscle weakness of lower extremity 2001;  09/05/2017   "resolved after a couple weeks; ?" (09/06/2017)   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HCC)    Poisoning, snake bite 04/08/2016   "[place]; RUE"   PONV (postoperative nausea and vomiting)    S/P right knee surgery    Situational anxiety    Staph infection ~ 2015   "left elbow and finger"  Dispostion: I considered admission for this patient, but at this time she does not meet inpatient criteria for admission and will be discharged with outpatient follow-up     Final Clinical Impression(s) / ED Diagnoses Final diagnoses:  Fall, initial encounter  Contusion of face, initial encounter  Injury of left rotator cuff, initial encounter  Closed fracture of manubrium, initial encounter     @PCDICTATION @    Glendora Score, MD 08/14/23 1523

## 2023-08-14 NOTE — Discharge Instructions (Addendum)
For pain:  - Acetaminophen 1000 mg three times daily (every 8 hours) - Naproxen 2 times daily (every 12 hours) - oxycodone for breakthrough pain only 

## 2023-08-30 ENCOUNTER — Ambulatory Visit (HOSPITAL_COMMUNITY): Payer: BLUE CROSS/BLUE SHIELD | Admitting: Psychiatry

## 2023-09-30 ENCOUNTER — Ambulatory Visit
Admission: EM | Admit: 2023-09-30 | Discharge: 2023-09-30 | Disposition: A | Discharge status: Home / Self Care | Payer: BLUE CROSS/BLUE SHIELD | Attending: Nurse Practitioner | Admitting: Nurse Practitioner

## 2023-09-30 DIAGNOSIS — J209 Acute bronchitis, unspecified: Secondary | ICD-10-CM

## 2023-09-30 MED ORDER — CETIRIZINE HCL 10 MG PO TABS: 10.0000 mg | ORAL_TABLET | Freq: Every day | ORAL | 0 refills | Status: DC

## 2023-09-30 NOTE — ED Provider Notes (Signed)
[facility]    CSN: [id number] Arrival date & time: 09/30/23  1318      History   Chief Complaint No chief complaint on file.   HPI Sabrina [name] is a 58 y.o. female.   The history is provided by the patient.   Patient presents for complaints of cough, fever, weakness, and decreased appetite.  She also states that she has no energy because she has been unable to sleep.  Patient states symptoms have been present for the past 5 days.  She did complete a video visit with a provider 2 days ago, she was prescribed Promethazine DM, albuterol inhaler, and prednisone.  She states that she has not started the prednisone as she has a history of bullous pemphigoid and is on methotrexate.  She states that she needs to speak to her dermatologist before she starts the prednisone, which she has not started today.  Denies wheezing, difficulty breathing, chest pain, abdominal pain, nausea, or vomiting. 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HCC)    Poisoning, snake bite 04/08/2016   "[place]; RUE"   PONV (postoperative nausea and vomiting)    S/P right knee surgery    Situational anxiety    Staph infection ~ 2015   "left elbow and finger"    Patient Active Problem List   Diagnosis Date Noted   Primary osteoarthritis of left knee 06/16/2020   Effusion, left knee 06/16/2020   Loose body in knee, left knee 06/16/2020   Vitamin D deficiency 03/24/2020   Hyperlipidemia 03/24/2020   Pain in left foot 01/21/2020   Primary osteoarthritis of right knee 08/06/2019   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   Other meniscus derangements, posterior horn of medial meniscus, left knee 02/19/2019   Bucket handle tear of  lateral meniscus 02/19/2019   Unilateral primary osteoarthritis, right knee 12/13/2018   Panic disorder 12/13/2018   Chronic headaches 12/13/2018   Weakness 09/06/2017   Depression 09/06/2017   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   Generalized anxiety disorder with panic attacks 09/06/2017   Right hand pain    Wrist swelling    Cellulitis of right upper extremity 04/11/2016   Elbow pain 07/20/2015   Anxiety 07/20/2015   Tachycardia 07/04/2015   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HCC)    Skin ulcer of upper arm, limited to breakdown of skin (HCC) 07/01/2015    Past Surgical History:  Procedure Laterality Date   APPENDECTOMY  ~ 1987   APPLICATION OF A-CELL OF EXTREMITY Left 08/05/2015   Procedure: APPLICATION OF A-CELL OF EXTREMITY;  Surgeon: Peggye Form, DO;  Location: [facility];  Service: Plastics;  Laterality: Left;   BREAST SURGERY Right 1990   "milk duct taken out"   CHONDROPLASTY Right 02/19/2019   Procedure: CHONDROPLASTY; EXCISION EXOSTOSIS;  Surgeon: Valeria Batman, MD;  Location: [facility];  Service: Orthopedics;  Laterality: Right;   DEBRIDEMENT AND CLOSURE WOUND Left 07/01/2015   Procedure: LEFT ELBOW EXCISION OF WOUND WITH PRIMARY CLOSURE 2X5 CM ;  Surgeon: Peggye Form, DO;  Location: [facility];  Service: Plastics;  Laterality: Left;   ELBOW SURGERY Left X 23  in [place] <06/2015   from a cat bite; all I&D   I & D EXTREMITY Left 07/08/2015   Procedure: IRRIGATION AND DEBRIDEMENT EXTREMITY, DRAINAGE OF LEFT ARM WOUND, A-CELL PLACEMENT, WOUND VAC PLACEMENT;  Surgeon: Alena Bills Dillingham, DO;  Location: [facility];  Service: Plastics;  Laterality: Left;   INCISION AND DRAINAGE OF WOUND Left 08/05/2015   Procedure: IRRIGATION AND DEBRIDEMENT LEFT ELBOW WOUND, PLACEMENT OF ACELL;  Surgeon: Peggye Form, DO;  Location: [facility];  Service: Plastics;  Laterality: Left;   KNEE ARTHROSCOPY WITH MEDIAL MENISECTOMY  Right 02/19/2019   Procedure: RIGHT KNEE ARTHROSCOPY, DEBRIDEMENT, PARTIAL MEDIAL AND LATERAL MENISECTOMY;  Surgeon: Valeria Batman, MD;  Location: [facility];  Service: Orthopedics;  Laterality: Right;   LAPAROSCOPIC CHOLECYSTECTOMY  1998   SKIN GRAFT Left 2016   took from anterior thigh; placed at elbow   TONSILLECTOMY  ~ 2000   TOTAL ABDOMINAL HYSTERECTOMY  2003   WRIST SURGERY Right 01/2016    OB History   No obstetric history on file.      Home Medications    Prior to Admission medications   Medication Sig Start Date End Date Taking? Authorizing Provider  cetirizine (ZYRTEC) 10 MG tablet Take 1 tablet (10 mg total) by mouth daily. 09/30/23  Yes Leath-Warren, Sadie Haber, NP  atorvastatin (LIPITOR) 20 MG tablet TAKE 1 TABLET BY MOUTH ONCE A DAY 03/31/21 03/31/22  Philip Aspen, Limmie Patricia, MD  butalbital-acetaminophen-caffeine (FIORICET) [phone] MG tablet TAKE ONE TABLET BY MOUTH EVERY 6 HOURS AS NEEDED FOR HEADACHE 05/02/22   Everlena Cooper, Adam R, DO  diazepam (VALIUM) 10 MG tablet 1 qam  2 qhs 07/12/23   Plovsky, Earvin Hansen, MD  diazepam (VALIUM) 5 MG/ML solution Take by mouth every 8 (eight) hours as needed for anxiety.    [provider]  erythromycin ophthalmic ointment Place a 1/2 inch ribbon of ointment into the left lower eyelid BID prn. 07/19/23   Particia Nearing, PA-C  folic acid (FOLVITE) 1 MG tablet Take 1 mg by mouth daily.    [provider]  gabapentin (NEURONTIN) 300 MG capsule Take 2 capsules (600 mg total) by mouth 3 (three) times daily. 08/10/21   Philip Aspen, Limmie Patricia, MD  methotrexate (RHEUMATREX) 2.5 MG tablet Take 2.5 mg by mouth once a week. Caution:Chemotherapy. Protect from light.    [provider]  oxyCODONE (ROXICODONE) 5 MG immediate release tablet Take 1 tablet (5 mg total) by mouth every 4 (four) hours as needed for severe pain (pain score 7-10). 08/14/23   Kommor, Madison, MD  predniSONE (DELTASONE) 20 MG  tablet Take 20 mg by mouth daily with breakfast.    [provider]  rosuvastatin (CRESTOR) 20 MG tablet Take 1 tablet by mouth daily. 06/08/22   [provider]  venlafaxine XR (EFFEXOR-XR) 150 MG 24 hr capsule Take 1 capsule (150 mg total) by mouth every morning. 07/12/23 07/11/24  Archer Asa, MD    Family History Family History  Problem Relation Age of Onset   Liver disease Mother    Dementia Mother    Cirrhosis Mother    Prostate cancer Father    Colon cancer Maternal Grandmother    Ovarian cancer Maternal Aunt     Social History Social History   Tobacco Use   Smoking status: Never   Smokeless tobacco: Never  Vaping Use   Vaping status: Never Used  Substance Use Topics   Alcohol use: Not Currently  Alcohol/week: 0.0 standard drinks of alcohol   Drug use: No     Allergies   Droperidol, Ondansetron, Zofran [ondansetron hcl], Benzonatate, Bromfed dm [pseudoeph-bromphen-dm], Diclofenac sodium, Flexeril [cyclobenzaprine], Morphine, and Morphine and codeine   Review of Systems Review of Systems Per HPI  Physical Exam Triage Vital Signs ED Triage Vitals  Encounter Vitals Group     BP 09/30/23 1435 117/82     Systolic BP Percentile --      Diastolic BP Percentile --      Pulse Rate 09/30/23 1435 73     Resp 09/30/23 1435 16     Temp 09/30/23 1435 (!) 97.3 F (36.3 C)     Temp Source 09/30/23 1435 Oral     SpO2 09/30/23 1435 98 %     Weight --      Height --      Head Circumference --      Peak Flow --      Pain Score 09/30/23 1436 0     Pain Loc --      Pain Education --      Exclude from Growth Chart --    No data found.  Updated Vital Signs BP 117/82 (BP Location: Right Arm)   Pulse 73   Temp (!) 97.3 F (36.3 C) (Oral)   Resp 16   SpO2 98%   Visual Acuity Right Eye Distance:   Left Eye Distance:   Bilateral Distance:    Right Eye Near:   Left Eye Near:    Bilateral Near:     Physical Exam Vitals and nursing note  reviewed.  Constitutional:      General: She is not in acute distress.    Appearance: Normal appearance.  HENT:     Head: Normocephalic.     Right Ear: Tympanic membrane, ear canal and external ear normal.     Left Ear: Tympanic membrane, ear canal and external ear normal.     Nose:     Right Turbinates: Enlarged and swollen.     Left Turbinates: Enlarged and swollen.     Right Sinus: No maxillary sinus tenderness or frontal sinus tenderness.     Left Sinus: No maxillary sinus tenderness or frontal sinus tenderness.     Mouth/Throat:     Lips: Pink.     Mouth: Mucous membranes are moist.     Pharynx: Uvula midline. Postnasal drip present. No pharyngeal swelling, oropharyngeal exudate, posterior oropharyngeal erythema or uvula swelling.  Eyes:     Extraocular Movements: Extraocular movements intact.     Conjunctiva/sclera: Conjunctivae normal.     Pupils: Pupils are equal, round, and reactive to light.  Cardiovascular:     Rate and Rhythm: Normal rate and regular rhythm.     Pulses: Normal pulses.     Heart sounds: Normal heart sounds.  Pulmonary:     Effort: Pulmonary effort is normal. No respiratory distress.     Breath sounds: Normal breath sounds. No stridor. No wheezing, rhonchi or rales.  Abdominal:     General: Bowel sounds are normal.     Palpations: Abdomen is soft.     Tenderness: There is no abdominal tenderness.  Musculoskeletal:     Cervical back: Normal range of motion.  Lymphadenopathy:     Cervical: No cervical adenopathy.  Skin:    General: Skin is warm and dry.  Neurological:     General: No focal deficit present.     Mental Status: She is alert and oriented  to person, place, and time.  Psychiatric:        Mood and Affect: Mood normal.        Behavior: Behavior normal.      UC Treatments / Results  Labs (all labs ordered are listed, but only abnormal results are displayed) Labs Reviewed - No data to display  EKG   Radiology No results  found.  Procedures Procedures (including critical care time)  Medications Ordered in UC Medications - No data to display  Initial Impression / Assessment and Plan / UC Course  I have reviewed the triage vital signs and the nursing notes.  Pertinent labs & imaging results that were available during my care of the patient were reviewed by me and considered in my medical decision making (see chart for details).  On exam, lung sounds are clear throughout, room air sats at 98%.  Suspect acute bronchitis.  Advised patient that current medication regimen is appropriate for current symptoms.  Advised patient to take over-the-counter Mucinex during the daytime to help with cough, and to save the Promethazine DM for nighttime as prescribed.  Supportive care recommendations were provided and discussed with the patient to include fluids, rest, over-the-counter analgesics, and use of a humidifier at nighttime during sleep.  Patient was advised to follow-up with her dermatologist to discuss use of prednisone.  Patient was in agreement with this plan of care and verbalizes understanding.  All questions were answered.  Patient stable for discharge.   Final Clinical Impressions(s) / UC Diagnoses   Final diagnoses:  Acute bronchitis, unspecified organism     Discharge Instructions      Take medication as prescribed.  Begin taking the promethazine at bedtime as prescribed.  You should be taking 5 mL at bedtime as needed. Increase fluids and allow for plenty of rest. Recommend using the Flonase that you have at home, along with normal saline nasal spray for nasal congestion and runny nose.  For the cough, recommend using a humidifier in your bedroom at nighttime during sleep and sleeping elevated on pillows while cough symptoms persist. As discussed, please follow-up with your physician regarding use of the prednisone.  I feel this will be the most effective in helping control your cough and current  symptoms. Follow-up as needed.      ED Prescriptions     Medication Sig Dispense Auth. Provider   cetirizine (ZYRTEC) 10 MG tablet Take 1 tablet (10 mg total) by mouth daily. 30 tablet Leath-Warren, Sadie Haber, NP      PDMP not reviewed this encounter.   Abran Cantor, NP 09/30/23 1517

## 2023-09-30 NOTE — Discharge Instructions (Addendum)
Take medication as prescribed.  Begin taking the promethazine at bedtime as prescribed.  You should be taking 5 mL at bedtime as needed. Increase fluids and allow for plenty of rest. Recommend using the Flonase that you have at home, along with normal saline nasal spray for nasal congestion and runny nose.  For the cough, recommend using a humidifier in your bedroom at nighttime during sleep and sleeping elevated on pillows while cough symptoms persist. As discussed, please follow-up with your physician regarding use of the prednisone.  I feel this will be the most effective in helping control your cough and current symptoms. Follow-up as needed.

## 2023-09-30 NOTE — ED Triage Notes (Signed)
Pt reports she has been having a fever, weakness, coughing, no energy, and no appetite x 5 days    Pcp called in promth cough syrup, inhaler, and prednisone but no relief.

## 2023-10-26 ENCOUNTER — Inpatient Hospital Stay (HOSPITAL_COMMUNITY): Payer: BLUE CROSS/BLUE SHIELD | Admitting: Certified Registered Nurse Anesthetist

## 2023-10-26 ENCOUNTER — Encounter (HOSPITAL_COMMUNITY): Payer: Self-pay | Admitting: *Deleted

## 2023-10-26 ENCOUNTER — Encounter (HOSPITAL_COMMUNITY)
Admission: EM | Disposition: A | Discharge status: Home / Self Care | Payer: Self-pay | Source: Home / Self Care | Attending: Family Medicine

## 2023-10-26 ENCOUNTER — Other Ambulatory Visit: Payer: Self-pay

## 2023-10-26 ENCOUNTER — Emergency Department (HOSPITAL_COMMUNITY): Payer: BLUE CROSS/BLUE SHIELD

## 2023-10-26 ENCOUNTER — Observation Stay (HOSPITAL_COMMUNITY)
Admission: EM | Admit: 2023-10-26 | Discharge: 2023-10-28 | Disposition: A | Discharge status: Home / Self Care | Payer: BLUE CROSS/BLUE SHIELD | Attending: Internal Medicine | Admitting: Internal Medicine

## 2023-10-26 DIAGNOSIS — K449 Diaphragmatic hernia without obstruction or gangrene: Secondary | ICD-10-CM

## 2023-10-26 DIAGNOSIS — Z79899 Other long term (current) drug therapy: Secondary | ICD-10-CM | POA: Insufficient documentation

## 2023-10-26 DIAGNOSIS — K922 Gastrointestinal hemorrhage, unspecified: Secondary | ICD-10-CM | POA: Diagnosis present

## 2023-10-26 DIAGNOSIS — K31819 Angiodysplasia of stomach and duodenum without bleeding: Secondary | ICD-10-CM | POA: Diagnosis not present

## 2023-10-26 DIAGNOSIS — K2970 Gastritis, unspecified, without bleeding: Secondary | ICD-10-CM

## 2023-10-26 DIAGNOSIS — K920 Hematemesis: Secondary | ICD-10-CM | POA: Diagnosis not present

## 2023-10-26 DIAGNOSIS — K2100 Gastro-esophageal reflux disease with esophagitis, without bleeding: Secondary | ICD-10-CM | POA: Insufficient documentation

## 2023-10-26 DIAGNOSIS — I890 Lymphedema, not elsewhere classified: Secondary | ICD-10-CM | POA: Diagnosis not present

## 2023-10-26 HISTORY — PX: ESOPHAGOGASTRODUODENOSCOPY (EGD) WITH PROPOFOL: SHX5813

## 2023-10-26 HISTORY — PX: BIOPSY: SHX5522

## 2023-10-26 HISTORY — DX: Disorder of thyroid, unspecified: E07.9

## 2023-10-26 LAB — COMPREHENSIVE METABOLIC PANEL
ALT: 25 U/L (ref 0–44)
AST: 31 U/L (ref 15–41)
Albumin: 3.8 g/dL (ref 3.5–5.0)
Alkaline Phosphatase: 121 U/L (ref 38–126)
Anion gap: 7 (ref 5–15)
BUN: 37 mg/dL — ABNORMAL HIGH (ref 6–20)
CO2: 21 mmol/L — ABNORMAL LOW (ref 22–32)
Calcium: 9.1 mg/dL (ref 8.9–10.3)
Chloride: 111 mmol/L (ref 98–111)
Creatinine, Ser: 0.81 mg/dL (ref 0.44–1.00)
GFR, Estimated: >60 mL/min (ref >60)
Glucose, Bld: 121 mg/dL — ABNORMAL HIGH (ref 70–99)
Potassium: 3.9 mmol/L (ref 3.5–5.1)
Sodium: 139 mmol/L (ref 135–145)
Total Bilirubin: 0.3 mg/dL (ref 0.0–1.2)
Total Protein: 7.1 g/dL (ref 6.5–8.1)

## 2023-10-26 LAB — CBC WITH DIFFERENTIAL/PLATELET
Abs Immature Granulocytes: 0 10*3/uL (ref 0.00–0.07)
Basophils Absolute: 0 10*3/uL (ref 0.0–0.1)
Basophils Relative: 0 %
Eosinophils Absolute: 0.1 10*3/uL (ref 0.0–0.5)
Eosinophils Relative: 3 %
HCT: 35.2 % — ABNORMAL LOW (ref 36.0–46.0)
Hemoglobin: 11.4 g/dL — ABNORMAL LOW (ref 12.0–15.0)
Immature Granulocytes: 0 %
Lymphocytes Relative: 28 %
Lymphs Abs: 1.2 10*3/uL (ref 0.7–4.0)
MCH: 30.5 pg (ref 26.0–34.0)
MCHC: 32.4 g/dL (ref 30.0–36.0)
MCV: 94.1 fL (ref 80.0–100.0)
Monocytes Absolute: 0.4 10*3/uL (ref 0.1–1.0)
Monocytes Relative: 10 %
Neutro Abs: 2.5 10*3/uL (ref 1.7–7.7)
Neutrophils Relative %: 59 %
Platelets: 219 10*3/uL (ref 150–400)
RBC: 3.74 MIL/uL — ABNORMAL LOW (ref 3.87–5.11)
RDW: 16.2 % — ABNORMAL HIGH (ref 11.5–15.5)
WBC: 4.2 10*3/uL (ref 4.0–10.5)
nRBC: 0 % (ref 0.0–0.2)

## 2023-10-26 LAB — URINALYSIS, ROUTINE W REFLEX MICROSCOPIC
Bilirubin Urine: NEGATIVE
Glucose, UA: NEGATIVE mg/dL
Hgb urine dipstick: NEGATIVE
Ketones, ur: NEGATIVE mg/dL
Leukocytes,Ua: NEGATIVE
Nitrite: NEGATIVE
Protein, ur: NEGATIVE mg/dL
Specific Gravity, Urine: 1.009 (ref 1.005–1.030)
pH: 6 (ref 5.0–8.0)

## 2023-10-26 LAB — TYPE AND SCREEN
ABO/RH(D): A POS
Antibody Screen: NEGATIVE

## 2023-10-26 LAB — PROTIME-INR
INR: 0.9 (ref 0.8–1.2)
Prothrombin Time: 12.2 s (ref 11.4–15.2)

## 2023-10-26 LAB — ABO/RH: ABO/RH(D): A POS

## 2023-10-26 LAB — LIPASE, BLOOD: Lipase: 50 U/L (ref 11–51)

## 2023-10-26 SURGERY — ESOPHAGOGASTRODUODENOSCOPY (EGD) WITH PROPOFOL
Anesthesia: General

## 2023-10-26 MED ORDER — FENTANYL CITRATE (PF) 100 MCG/2ML IJ SOLN
50.0000 ug | Freq: Once | INTRAMUSCULAR | Status: AC
  Administered 2023-10-26: 50 ug via INTRAVENOUS

## 2023-10-26 MED ORDER — PANTOPRAZOLE SODIUM 40 MG IV SOLR
40.0000 mg | Freq: Once | INTRAVENOUS | Status: AC
  Administered 2023-10-26: 40 mg via INTRAVENOUS
  Filled 2023-10-26: qty 10

## 2023-10-26 MED ORDER — LIDOCAINE HCL (PF) 2 % IJ SOLN
INTRAMUSCULAR | Status: AC
  Filled 2023-10-26: qty 5

## 2023-10-26 MED ORDER — FENTANYL CITRATE PF 50 MCG/ML IJ SOSY
25.0000 ug | PREFILLED_SYRINGE | Freq: Once | INTRAMUSCULAR | Status: AC | PRN
  Administered 2023-10-26: 25 ug via INTRAVENOUS
  Filled 2023-10-26: qty 1

## 2023-10-26 MED ORDER — PROPOFOL 500 MG/50ML IV EMUL
INTRAVENOUS | Status: DC | PRN
  Administered 2023-10-26: 150 ug/kg/min via INTRAVENOUS

## 2023-10-26 MED ORDER — LACTATED RINGERS IV BOLUS
1000.0000 mL | Freq: Once | INTRAVENOUS | Status: AC
  Administered 2023-10-26: 1000 mL via INTRAVENOUS

## 2023-10-26 MED ORDER — FENTANYL CITRATE (PF) 100 MCG/2ML IJ SOLN
25.0000 ug | INTRAMUSCULAR | Status: DC | PRN
Start: 2023-10-26 — End: 2023-10-26

## 2023-10-26 MED ORDER — SODIUM CHLORIDE 0.9 % IV SOLN: INTRAVENOUS | Status: DC

## 2023-10-26 MED ORDER — DIPHENHYDRAMINE HCL 50 MG/ML IJ SOLN
12.5000 mg | Freq: Once | INTRAMUSCULAR | Status: AC
  Administered 2023-10-26: 12.5 mg via INTRAVENOUS
  Filled 2023-10-26: qty 1

## 2023-10-26 MED ORDER — PHENYLEPHRINE 80 MCG/ML (10ML) SYRINGE FOR IV PUSH (FOR BLOOD PRESSURE SUPPORT)
PREFILLED_SYRINGE | INTRAVENOUS | Status: DC | PRN
  Administered 2023-10-26 (×2): 160 ug via INTRAVENOUS

## 2023-10-26 MED ORDER — SODIUM CHLORIDE 0.9 % IV SOLN: INTRAVENOUS | Status: AC

## 2023-10-26 MED ORDER — SCOPOLAMINE 1 MG/3DAYS TD PT72
1.0000 | MEDICATED_PATCH | TRANSDERMAL | Status: DC
  Filled 2023-10-26: qty 1

## 2023-10-26 MED ORDER — LACTATED RINGERS IV SOLN: INTRAVENOUS | Status: DC | PRN

## 2023-10-26 MED ORDER — PHENYLEPHRINE 80 MCG/ML (10ML) SYRINGE FOR IV PUSH (FOR BLOOD PRESSURE SUPPORT)
PREFILLED_SYRINGE | INTRAVENOUS | Status: AC
  Filled 2023-10-26: qty 10

## 2023-10-26 MED ORDER — PROCHLORPERAZINE EDISYLATE 10 MG/2ML IJ SOLN
10.0000 mg | Freq: Once | INTRAMUSCULAR | Status: AC
  Administered 2023-10-26: 10 mg via INTRAVENOUS
  Filled 2023-10-26: qty 2

## 2023-10-26 MED ORDER — MEPERIDINE HCL 50 MG/ML IJ SOLN: 6.2500 mg | INTRAMUSCULAR | Status: DC | PRN

## 2023-10-26 MED ORDER — METOCLOPRAMIDE HCL 5 MG/ML IJ SOLN
10.0000 mg | Freq: Once | INTRAMUSCULAR | Status: AC | PRN
  Administered 2023-10-26: 10 mg via INTRAVENOUS
  Filled 2023-10-26: qty 2

## 2023-10-26 MED ORDER — IOHEXOL 300 MG/ML  SOLN
100.0000 mL | Freq: Once | INTRAMUSCULAR | Status: AC | PRN
  Administered 2023-10-26: 100 mL via INTRAVENOUS

## 2023-10-26 MED ORDER — GABAPENTIN 300 MG PO CAPS
300.0000 mg | ORAL_CAPSULE | Freq: Three times a day (TID) | ORAL | Status: DC
  Administered 2023-10-26: 300 mg via ORAL
  Filled 2023-10-26: qty 1

## 2023-10-26 MED ORDER — PROPOFOL 10 MG/ML IV BOLUS
INTRAVENOUS | Status: DC | PRN
  Administered 2023-10-26: 80 mg via INTRAVENOUS
  Administered 2023-10-26: 40 mg via INTRAVENOUS

## 2023-10-26 MED ORDER — LIDOCAINE HCL (CARDIAC) PF 100 MG/5ML IV SOSY
PREFILLED_SYRINGE | INTRAVENOUS | Status: DC | PRN
  Administered 2023-10-26: 100 mg via INTRATRACHEAL

## 2023-10-26 MED ORDER — SCOPOLAMINE 1 MG/3DAYS TD PT72
MEDICATED_PATCH | TRANSDERMAL | Status: AC
  Administered 2023-10-26: 1.5 mg via TRANSDERMAL
  Filled 2023-10-26: qty 1

## 2023-10-26 MED ORDER — PANTOPRAZOLE SODIUM 40 MG IV SOLR
40.0000 mg | Freq: Two times a day (BID) | INTRAVENOUS | Status: DC
  Administered 2023-10-26 – 2023-10-28 (×4): 40 mg via INTRAVENOUS
  Filled 2023-10-26 (×4): qty 10

## 2023-10-26 MED ORDER — FENTANYL CITRATE PF 50 MCG/ML IJ SOSY
PREFILLED_SYRINGE | INTRAMUSCULAR | Status: AC
  Filled 2023-10-26: qty 1

## 2023-10-26 NOTE — H&P (View-Only) (Signed)
Gastroenterology Consult   Referring Provider: No ref. provider found Primary Care Physician:  Randel Pigg, Dorma Russell, MD Primary Gastroenterologist:  [facility], [place], [place]  Patient ID: Sabrina [name]; [id number]; [birth year]   Admit date: 10/26/2023  LOS: 0 days   Date of Consultation: 10/26/2023  Reason for Consultation:  hematemesis  History of Present Illness   Sabrina [name] is a 58 y.o. 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  In the ED: Hemoglobin 11.4, hematocrit 35.2, white blood cell count 4200, platelets 219,000, MCV 94.1, BUN 37, creatinine 0.81, LFTs normal, lipase 50 normal, INR 0.9, respiratory panel ordered.  CT abdomen pelvis with no acute findings.   GI consult:  Patient presenting with 2-day history of acute onset epigastric pain associated with vomiting.  Initially she noted streaks of blood in the emesis.  Symptoms progressed to vomiting blood clots.  PCP urged her to come to the emergency department yesterday evening but she seemed to be improving towards the evening hours.  This morning she woke up around 3 AM and actually felt hungry and try to eat a piece of toast.  After a couple of bites she started vomiting blood again.  Really denies any typical heartburn symptoms.  She states that she typically takes Protonix daily and famotidine as well.  Denies any NSAID or aspirin use given her history of gastritis.  EGD in 2023 with gastritis felt to be NSAID related at that time.  She denies any constipation or diarrhea.  No melena or rectal bleeding.  She complains of dysphagia about 80% of the time she is eating solid foods.  Denies any pill or liquid  dysphagia.  She states she has a history of thyroid nodule and not sure if this is contributing.  She has a history of iron deficiency anemia, has not been able to take oral iron due to nausea.  She worries that she may start having withdrawal symptoms from her chronic medications that she has not been able to keep it down.  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    Prior data:  Labs from January 2025: Iron 49, ferritin 10, TIBC 566, iron sats 9%.  Hemoglobin 11.4, MCV 89.6.  Labs from October 2023: B12 570.  CT chest without contrast December 2024: IMPRESSION: 1. Nondisplaced fracture through the mid body of sternum. There is slight increased sclerosis along the fracture margins suggesting subacute injury. Correlation for any focal tenderness over this area is advised. 2. 1.8 cm nodule arising off the inferior pole of the left lobe of thyroid gland. Recommend thyroid US (ref: J Am Coll Radiol. 2015 Feb;12(2): 143-50). 3. Multiple liver cysts. 4.  Aortic Atherosclerosis (ICD10-I70.0).  EGD 02/2022: -Esophagus normal -Mild erythema and several erosions in the gastric antrum, status post biopsy, focal minimal chronic inflammation and patchy mild reactive glandular changes.  H. pylori negative. -first and second portion duodenum appeared normal  Colonoscopy 02/2022: -Terminal ileum for 10 cm normal -colon normal -Medium internal hemorrhoids -Repeat colonoscopy in 10 years   Prior to Admission medications   Medication Sig Start Date End Date Taking? Authorizing Provider  atorvastatin (LIPITOR) 20 MG tablet TAKE 1 TABLET BY  MOUTH ONCE A DAY 03/31/21 03/31/22  Philip Aspen, Limmie Patricia, MD  butalbital-acetaminophen-caffeine (FIORICET) [phone] MG tablet TAKE ONE TABLET BY MOUTH EVERY 6 HOURS AS NEEDED FOR HEADACHE 05/02/22   Drema Dallas, DO  cetirizine (ZYRTEC) 10 MG tablet Take 1 tablet (10 mg total) by mouth daily. 09/30/23    Leath-Warren, Sadie Haber, NP  diazepam (VALIUM) 10 MG tablet 1 qam  2 qhs 07/12/23   Plovsky, Earvin Hansen, MD  diazepam (VALIUM) 5 MG/ML solution Take by mouth every 8 (eight) hours as needed for anxiety.    [provider]  erythromycin ophthalmic ointment Place a 1/2 inch ribbon of ointment into the left lower eyelid BID prn. 07/19/23   Particia Nearing, PA-C  folic acid (FOLVITE) 1 MG tablet Take 1 mg by mouth daily.    [provider]  gabapentin (NEURONTIN) 300 MG capsule Take 2 capsules (600 mg total) by mouth 3 (three) times daily. 08/10/21   Philip Aspen, Limmie Patricia, MD  methotrexate (RHEUMATREX) 2.5 MG tablet Take 2.5 mg by mouth once a week. Caution:Chemotherapy. Protect from light.    [provider]  oxyCODONE (ROXICODONE) 5 MG immediate release tablet Take 1 tablet (5 mg total) by mouth every 4 (four) hours as needed for severe pain (pain score 7-10). 08/14/23   Kommor, Wyn Forster, MD        [provider]  rosuvastatin (CRESTOR) 20 MG tablet Take 1 tablet by mouth daily. 06/08/22   [provider]  venlafaxine XR (EFFEXOR-XR) 150 MG 24 hr capsule Take 1 capsule (150 mg total) by mouth every morning. 07/12/23 07/11/24  Archer Asa, MD    No current facility-administered medications for this encounter.   Current Outpatient Medications  Medication Sig Dispense Refill   atorvastatin (LIPITOR) 20 MG tablet TAKE 1 TABLET BY MOUTH ONCE A DAY 90 tablet 1   butalbital-acetaminophen-caffeine (FIORICET) 50-325-40 MG tablet TAKE ONE TABLET BY MOUTH EVERY 6 HOURS AS NEEDED FOR HEADACHE 10 tablet 2   cetirizine (ZYRTEC) 10 MG tablet Take 1 tablet (10 mg total) by mouth daily. 30 tablet 0   diazepam (VALIUM) 10 MG tablet 1 qam  2 qhs 90 tablet 3   diazepam (VALIUM) 5 MG/ML solution Take by mouth every 8 (eight) hours as needed for anxiety.     erythromycin ophthalmic ointment Place a 1/2 inch ribbon of ointment into the left lower eyelid BID prn.  3.5 g 0   folic acid (FOLVITE) 1 MG tablet Take 1 mg by mouth daily.     gabapentin (NEURONTIN) 300 MG capsule Take 2 capsules (600 mg total) by mouth 3 (three) times daily. 540 capsule 1   methotrexate (RHEUMATREX) 2.5 MG tablet Take 2.5 mg by mouth once a week. Caution:Chemotherapy. Protect from light.     oxyCODONE (ROXICODONE) 5 MG immediate release tablet Take 1 tablet (5 mg total) by mouth every 4 (four) hours as needed for severe pain (pain score 7-10). 10 tablet 0   predniSONE (DELTASONE) 20 MG tablet Take 20 mg by mouth daily with breakfast.     rosuvastatin (CRESTOR) 20 MG tablet Take 1 tablet by mouth daily.     venlafaxine XR (EFFEXOR-XR) 150 MG 24 hr capsule Take 1 capsule (150 mg total) by mouth every morning. 90 capsule 1    Allergies as of 10/26/2023 - Review Complete 10/26/2023  Allergen Reaction Noted   Droperidol Palpitations and Other (See Comments) 07/01/2015   Ondansetron Rash, Swelling, and Hives 03/09/2015   Zofran Frazier Richards  hcl] Hives and Other (See Comments) 03/09/2015   Benzonatate Other (See Comments) and Rash 09/21/2015   Bromfed dm [pseudoeph-bromphen-dm] Other (See Comments) 09/17/2021   Diclofenac sodium Rash 08/14/2018   Flexeril [cyclobenzaprine] Anxiety 01/18/2017   Morphine Itching 07/13/2015   Morphine and codeine Itching 11/09/2013    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HCC)    Poisoning, snake bite 04/08/2016   "[place]; RUE"   PONV (postoperative nausea and vomiting)    S/P right knee surgery    Situational anxiety    Staph infection ~ 2015   "left elbow and finger"   Thyroid mass     Past Surgical History:  Procedure Laterality Date    APPENDECTOMY  ~ 1987   APPLICATION OF A-CELL OF EXTREMITY Left 08/05/2015   Procedure: APPLICATION OF A-CELL OF EXTREMITY;  Surgeon: Peggye Form, DO;  Location: [facility];  Service: Plastics;  Laterality: Left;   BREAST SURGERY Right 1990   "milk duct taken out"   CHONDROPLASTY Right 02/19/2019   Procedure: CHONDROPLASTY; EXCISION EXOSTOSIS;  Surgeon: Valeria Batman, MD;  Location: [facility];  Service: Orthopedics;  Laterality: Right;   DEBRIDEMENT AND CLOSURE WOUND Left 07/01/2015   Procedure: LEFT ELBOW EXCISION OF WOUND WITH PRIMARY CLOSURE 2X5 CM ;  Surgeon: Peggye Form, DO;  Location: [facility];  Service: Plastics;  Laterality: Left;   ELBOW SURGERY Left X 23 in [place] <06/2015   from a cat bite; all I&D   I & D EXTREMITY Left 07/08/2015   Procedure: IRRIGATION AND DEBRIDEMENT EXTREMITY, DRAINAGE OF LEFT ARM WOUND, A-CELL PLACEMENT, WOUND VAC PLACEMENT;  Surgeon: Alena Bills Dillingham, DO;  Location: [facility];  Service: Plastics;  Laterality: Left;   INCISION AND DRAINAGE OF WOUND Left 08/05/2015   Procedure: IRRIGATION AND DEBRIDEMENT LEFT ELBOW WOUND, PLACEMENT OF ACELL;  Surgeon: Peggye Form, DO;  Location: [facility];  Service: Plastics;  Laterality: Left;   KNEE ARTHROSCOPY WITH MEDIAL MENISECTOMY Right 02/19/2019   Procedure: RIGHT KNEE ARTHROSCOPY, DEBRIDEMENT, PARTIAL MEDIAL AND LATERAL MENISECTOMY;  Surgeon: Valeria Batman, MD;  Location:  SURGERY CENTER;  Service: Orthopedics;  Laterality: Right;   LAPAROSCOPIC CHOLECYSTECTOMY  1998   SKIN GRAFT Left 2016   took from anterior thigh; placed at elbow   TONSILLECTOMY  ~ 2000   TOTAL ABDOMINAL HYSTERECTOMY  2003   WRIST SURGERY Right 01/2016    Family History  Problem Relation Age of Onset   Liver disease Mother    Dementia Mother    Cirrhosis Mother    Prostate cancer Father    Colon cancer Maternal Grandmother    Ovarian  cancer Maternal Aunt     Social History   Socioeconomic History   Marital status: Divorced    Spouse name: Not on file   Number of children: 1   Years of education: Not on file   Highest education level: Associate degree: academic program  Occupational History   Occupation: unemployed  Tobacco Use   Smoking status: Never   Smokeless tobacco: Never  Vaping Use   Vaping status: Never Used  Substance and Sexual Activity   Alcohol use: Not Currently    Alcohol/week: 0.0 standard drinks of alcohol   Drug use: No   Sexual activity: Not on file  Other Topics Concern   Not on file  Social History Narrative   Pateint is right-handed. She lives alone in a single level home. She rarely drinks caffeine. She is limited to exercise due to knee injuries.   Social Drivers of Health   Financial Resource Strain: High Risk (06/05/2022)   [facility] to [facility]12[facility] visits prior to 11/12/2022.   Overall Financial Resource Strain (CARDIA)    Difficulty of Paying Living Expenses: Hard  Food Insecurity: Low Risk  (10/02/2023)   [facility]   Hunger Vital Sign    Worried About Running Out of Food in the Last Year: Never true    Ran Out of Food in the Last Year: Never true  Transportation Needs: No Transportation Needs (10/02/2023)   Received from [facility]    In the past 12 months, has lack of reliable transportation kept you from medical appointments, meetings, work or from getting things needed for daily living? : No  Physical Activity: Inactive (06/05/2022)   [facility] prior to 11/12/2022[facility] visits prior to 11/12/2022.   Exercise Vital Sign    Days of Exercise per Week: 0 days    Minutes of Exercise per Session: 10 min  Stress: Stress Concern  Present (06/05/2022)   [facility] prior to 11/12/2022., [facility] visits prior to 11/12/2022.   [employer] of Occupational Health - Occupational Stress Questionnaire    Feeling of Stress : Rather much  Social Connections: Moderately Isolated (06/05/2022)   [facility] to 11/12/2022., [facility] visits prior to 11/12/2022.   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]    Frequency of Communication with Friends and Family: More than three times a week    Frequency of Social Gatherings with Friends and Family: Twice a week    Attends Religious Services: More than 4 times per year    Active Member of [employer] or Organizations: No    Attends Banker Meetings: Patient declined    Marital Status: Divorced  Catering manager Violence: Not At Risk (06/05/2022)   [facility] prior to 11/12/2022., [facility] visits prior to 11/12/2022.   Humiliation, Afraid, Rape, and Kick questionnaire    Fear of Current or Ex-Partner: No    Emotionally Abused: No    Physically Abused: No    Sexually Abused: No     Review of System:   General: Negative for anorexia, weight loss, fever, chills, fatigue, weakness. Eyes: Negative for vision changes.  ENT: Negative for hoarseness, difficulty swallowing , nasal congestion. CV: Negative for chest pain, angina, palpitations, dyspnea on exertion, peripheral edema.  Respiratory: Negative for dyspnea at rest, dyspnea on exertion, cough, sputum, wheezing.  GI: See history of present illness. GU:  Negative for dysuria, hematuria, urinary incontinence, urinary frequency, nocturnal urination.  MS: chronic left arm pain/numbness managed with gabapentin. Negative for low back pain.  Derm: Negative for rash or  itching.  Neuro: Negative for weakness, abnormal sensation, seizure, frequent headaches, memory loss, confusion.  Psych: Negative for anxiety, depression, suicidal ideation, hallucinations.  Endo: Negative for unusual weight change.  Heme: Negative for bruising or bleeding. Allergy: Negative for rash or hives.      Physical Examination:   Vital signs in last 24 hours: Temp:  [98.4 F (36.9 C)] 98.4 F (36.9 C) (02/13 0754) Pulse Rate:  [86-107] 87 (02/13 1200) Resp:  [14-18] 16 (02/13 1200) BP: (98-118)/(71-81) 112/72 (02/13 1200) SpO2:  [94 %-99 %] 98 % (02/13 1200) Weight:  [78.9 kg] 78.9 kg (02/13 0750)    General: Well-nourished, well-developed in no acute distress.  Head: Normocephalic, atraumatic.   Eyes: Conjunctiva pink, no icterus. Mouth: Oropharyngeal mucosa moist and pink   Neck: Supple without thyromegaly, masses, or lymphadenopathy.  Lungs: Clear to auscultation bilaterally.  Heart: Regular rate and rhythm, no murmurs rubs or gallops.  Abdomen: Bowel sounds are normal,  nondistended, no hepatosplenomegaly or masses, no abdominal bruits or hernia , no rebound or guarding.  Mild-mod epigastric tenderness Rectal: not performed Extremities: No lower extremity edema, clubbing, deformity.  Neuro: Alert and oriented x 4 , grossly normal neurologically.  Skin: Warm and dry, no rash or jaundice.   Psych: Alert and cooperative, normal mood and affect.        Intake/Output from previous day: No intake/output data recorded. Intake/Output this shift: No intake/output data recorded.  Lab Results:   CBC Recent Labs    10/26/23 0827  WBC 4.2  HGB 11.4*  HCT 35.2*  MCV 94.1  PLT 219   BMET Recent Labs    10/26/23 0827  NA 139  K 3.9  CL 111  CO2 21*  GLUCOSE 121*  BUN 37*  CREATININE 0.81  CALCIUM 9.1   LFT Recent Labs    10/26/23 0827  BILITOT 0.3  ALKPHOS 121  AST 31  ALT 25  PROT 7.1  ALBUMIN 3.8    Lipase Recent Labs    10/26/23 0827   LIPASE 50    PT/INR Recent Labs    10/26/23 0827  LABPROT 12.2  INR 0.9     Hepatitis Panel No results for input(s): "HEPBSAG", "HCVAB", "HEPAIGM", "HEPBIGM" in the last 72 hours.   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  Hematemesis/abdominal pain: -acute onset symptoms two days ago, ?viral or food poisoning with bleeding due to M-W tear but patient is at risk for gastritis/PUD with history of chronic intermittent steroids, stopped about one month ago.  -CT reassuring -EGD today.  I have discussed the risks, alternatives, benefits with regards to but not limited to the risk of reaction to medication, bleeding, infection, perforation and the patient is agreeable to proceed. Written consent to be obtained.  Solid food dysphagia: -occurring 80% of the time she is eating -EGD today, possible esophageal dilation if appropriate  IDA: -Hgb 11.4 09/2023, same today.  -ferritin 10, TIBC 566, iron 49, fe sat 9, B12 normal 2023 -colonoscopy/EGD 02/2022 as outlined -await EGD findings    LOS: 0 days   We would like to thank you for the opportunity to participate in the care of [name].  Leanna Battles. Melvyn Neth,  [facility] [phone] 2/13/202512:45 PM

## 2023-10-26 NOTE — Progress Notes (Signed)
Patient underwent EGD under propofol sedation.  Tolerated the procedure adequately.   FINDINGS:  - LA Grade A reflux esophagitis.  - Erosive Gastritis. Biopsied. - 2 cm hiatal hernia.  - Duodenal mucosal lymphangiectasia.  RECOMMENDATIONS  -Restart diet as tolerated  -PPI daily for 8 weeks  -Avoid NSAIDs  -Follow up path results  -Follow up in GI clinic   Vista Lawman, MD Gastroenterology and Hepatology [facility]

## 2023-10-26 NOTE — ED Provider Triage Note (Signed)
Emergency Medicine Provider Triage Evaluation Note  Sabrina [name] , a 58 y.o. female  was evaluated in triage.  Pt complains of abdominal pain and hematemesis.  For the last 48 hours she has had persistent vomiting with streaks of blood.  Has worsened with bright red blood and blood clots.  No history of varices but does have a history of peptic ulcer disease.  On methotrexate.  Unable to tolerate p.o. at home.   Review of Systems  Positive: Nausea, vomiting, hematemesis, abdominal pain Negative: Chest pain, shortness of breath, headache, fever  Physical Exam  BP 118/81 (BP Location: Right Arm)   Pulse (!) 107   Temp 98.4 F (36.9 C) (Oral)   Resp 18   Ht 5\' 4"  (1.626 m)   Wt 78.9 kg   SpO2 99%   BMI 29.87 kg/m  Gen:   Awake, no distress   Resp:  Normal effort  MSK:   Moves extremities without difficulty     Medical Decision Making  Medically screening exam initiated at 8:39 AM.  Appropriate orders placed.  GENESEE NASE was informed that the remainder of the evaluation will be completed by another provider, this initial triage assessment does not replace that evaluation, and the importance of remaining in the ED until their evaluation is complete.     Glendora Score, MD 10/26/23 [phone]

## 2023-10-26 NOTE — Transfer of Care (Signed)
Immediate Anesthesia Transfer of Care Note  Patient: Sabrina [name]  Procedure(s) Performed: ESOPHAGOGASTRODUODENOSCOPY (EGD) WITH PROPOFOL BIOPSY  Patient Location: PACU  Anesthesia Type:General  Level of Consciousness: drowsy and patient cooperative  Airway & Oxygen Therapy: Patient Spontanous Breathing  Post-op Assessment: Report given to RN and Post -op Vital signs reviewed and stable  Post vital signs: Reviewed and stable  Last Vitals:  Vitals Value Taken Time  BP 97/53 10/26/23 1630  Temp 36.6 C 10/26/23 1625  Pulse 80 10/26/23 1630  Resp 14 10/26/23 1630  SpO2 100 % 10/26/23 1630  Vitals shown include unfiled device data.  Last Pain:  Vitals:   10/26/23 1603  TempSrc:   PainSc: 7       Patients Stated Pain Goal: 3 (10/26/23 1518)  Complications: No notable events documented.

## 2023-10-26 NOTE — Anesthesia Preprocedure Evaluation (Signed)
Anesthesia Evaluation  Patient identified by MRN, date of birth, ID band Patient awake    Reviewed: Allergy & Precautions, H&P , NPO status , Patient's Chart, lab work & pertinent test results  History of Anesthesia Complications (+) PONV and history of anesthetic complications  Airway Mallampati: II  TM Distance: >3 FB Neck ROM: Full    Dental no notable dental hx.    Pulmonary neg pulmonary ROS   Pulmonary exam normal breath sounds clear to auscultation       Cardiovascular negative cardio ROS Normal cardiovascular exam Rhythm:Regular Rate:Normal     Neuro/Psych  Headaches PSYCHIATRIC DISORDERS Anxiety Depression     Neuromuscular disease    GI/Hepatic Neg liver ROS,GERD  ,,N/V   Endo/Other  negative endocrine ROS    Renal/GU negative Renal ROS  negative genitourinary   Musculoskeletal  (+) Arthritis ,    Abdominal   Peds negative pediatric ROS (+)  Hematology  (+) Blood dyscrasia, anemia   Anesthesia Other Findings   Reproductive/Obstetrics negative OB ROS                             Anesthesia Physical Anesthesia Plan  ASA: 2  Anesthesia Plan: General   Post-op Pain Management: Minimal or no pain anticipated   Induction: Intravenous  PONV Risk Score and Plan: 1 and Propofol infusion  Airway Management Planned: Simple Face Mask and Nasal Cannula  Additional Equipment:   Intra-op Plan:   Post-operative Plan:   Informed Consent: I have reviewed the patients History and Physical, chart, labs and discussed the procedure including the risks, benefits and alternatives for the proposed anesthesia with the patient or authorized representative who has indicated his/her understanding and acceptance.       Plan Discussed with: CRNA  Anesthesia Plan Comments:        Anesthesia Quick Evaluation

## 2023-10-26 NOTE — ED Provider Notes (Signed)
[place] EMERGENCY DEPARTMENT AT [facility] Provider Note  CSN: [id number] Arrival date & time: 10/26/23 4098  Chief Complaint(s) Emesis  HPI Sabrina [name] is a 58 y.o. female with PMH bullous pemphigoid on methotrexate, peptic ulcer disease, multiple previous surgeries for elbow osteomyelitis, anxiety, multinodular goiter who presents emergency room for evaluation of abdominal pain nausea and vomiting.  Symptoms have been gradually worsening the last 48 hours.  States that first episode of vomiting contained mixed contents including stomach acid with streaks of blood.  However hematemesis has progressed and she states that she is vomiting bright red blood and large clots.  Has been unable to tolerate any of her home oral medications.  Spoke with primary care physician who called in some Phenergan for the patient but as she is vomiting she is unable to keep this medication down.  Arrives with complaints of epigastric pain but denies hematochezia, chest pain, shortness of breath, headache, fever or other systemic symptoms.   Past Medical History 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HCC)    Poisoning, snake bite 04/08/2016   "[place]; RUE"   PONV (postoperative nausea and vomiting)    S/P right knee surgery    Situational anxiety    Staph infection ~ 2015   "left elbow and finger"   Thyroid mass    Patient Active Problem List   Diagnosis Date Noted   Primary osteoarthritis of left knee 06/16/2020   Effusion, left knee 06/16/2020   Loose body in knee, left knee 06/16/2020   Vitamin D deficiency 03/24/2020   Hyperlipidemia 03/24/2020   Pain in left foot 01/21/2020   Primary  osteoarthritis of right knee 08/06/2019   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   Other meniscus derangements, posterior horn of medial meniscus, left knee 02/19/2019   Bucket handle tear of lateral meniscus 02/19/2019   Unilateral primary osteoarthritis, right knee 12/13/2018   Panic disorder 12/13/2018   Chronic headaches 12/13/2018   Weakness 09/06/2017   Depression 09/06/2017   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   Generalized anxiety disorder with panic attacks 09/06/2017   Right hand pain    Wrist swelling    Cellulitis of right upper extremity 04/11/2016   Elbow pain 07/20/2015   Anxiety 07/20/2015   Tachycardia 07/04/2015   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HCC)    Skin ulcer of upper arm, limited to breakdown of skin (HCC) 07/01/2015   Home Medication(s) Prior to Admission medications   Medication Sig Start Date End Date Taking? Authorizing Provider  atorvastatin (LIPITOR) 20 MG tablet TAKE 1 TABLET BY MOUTH ONCE A DAY 03/31/21 03/31/22  Philip Aspen, Limmie Patricia, MD  butalbital-acetaminophen-caffeine (FIORICET) [phone] MG tablet TAKE ONE TABLET BY MOUTH EVERY 6 HOURS AS NEEDED FOR HEADACHE 05/02/22   Everlena Cooper, Adam R, DO  cetirizine (ZYRTEC) 10 MG tablet Take 1 tablet (10 mg total) by mouth daily. 09/30/23   Leath-Warren, Sadie Haber, NP  diazepam (VALIUM) 10 MG tablet 1 qam  2 qhs 07/12/23   Plovsky, Earvin Hansen, MD  diazepam (VALIUM) 5 MG/ML solution Take by mouth every 8 (eight) hours as needed for anxiety.    [provider]  erythromycin ophthalmic  ointment Place a 1/2 inch ribbon of ointment into the left lower eyelid BID prn. 07/19/23   Particia Nearing, PA-C  folic acid (FOLVITE) 1 MG tablet Take 1 mg by mouth daily.    [provider]  gabapentin (NEURONTIN) 300 MG capsule Take 2 capsules (600 mg total) by mouth 3 (three) times daily. 08/10/21   Philip Aspen, Limmie Patricia, MD  methotrexate (RHEUMATREX) 2.5 MG tablet Take 2.5 mg by mouth once a week. Caution:Chemotherapy. Protect from  light.    [provider]  oxyCODONE (ROXICODONE) 5 MG immediate release tablet Take 1 tablet (5 mg total) by mouth every 4 (four) hours as needed for severe pain (pain score 7-10). 08/14/23   Cristela Stalder, MD  predniSONE (DELTASONE) 20 MG tablet Take 20 mg by mouth daily with breakfast.    [provider]  rosuvastatin (CRESTOR) 20 MG tablet Take 1 tablet by mouth daily. 06/08/22   [provider]  venlafaxine XR (EFFEXOR-XR) 150 MG 24 hr capsule Take 1 capsule (150 mg total) by mouth every morning. 07/12/23 07/11/24  Archer Asa, MD                                                                                                                                    Past Surgical History Past Surgical History:  Procedure Laterality Date   APPENDECTOMY  ~ 1987   APPLICATION OF A-CELL OF EXTREMITY Left 08/05/2015   Procedure: APPLICATION OF A-CELL OF EXTREMITY;  Surgeon: Peggye Form, DO;  Location: [facility];  Service: Plastics;  Laterality: Left;   BREAST SURGERY Right 1990   "milk duct taken out"   CHONDROPLASTY Right 02/19/2019   Procedure: CHONDROPLASTY; EXCISION EXOSTOSIS;  Surgeon: Valeria Batman, MD;  Location: [facility];  Service: Orthopedics;  Laterality: Right;   DEBRIDEMENT AND CLOSURE WOUND Left 07/01/2015   Procedure: LEFT ELBOW EXCISION OF WOUND WITH PRIMARY CLOSURE 2X5 CM ;  Surgeon: Peggye Form, DO;  Location: [facility];  Service: Plastics;  Laterality: Left;   ELBOW SURGERY Left X 23 in [place] <06/2015   from a cat bite; all I&D   I & D EXTREMITY Left 07/08/2015   Procedure: IRRIGATION AND DEBRIDEMENT EXTREMITY, DRAINAGE OF LEFT ARM WOUND, A-CELL PLACEMENT, WOUND VAC PLACEMENT;  Surgeon: Alena Bills Dillingham, DO;  Location: [facility];  Service: Plastics;  Laterality: Left;   INCISION AND DRAINAGE OF WOUND Left 08/05/2015   Procedure: IRRIGATION AND DEBRIDEMENT LEFT ELBOW WOUND, PLACEMENT  OF ACELL;  Surgeon: Peggye Form, DO;  Location: [facility];  Service: Plastics;  Laterality: Left;   KNEE ARTHROSCOPY WITH MEDIAL MENISECTOMY Right 02/19/2019   Procedure: RIGHT KNEE ARTHROSCOPY, DEBRIDEMENT, PARTIAL MEDIAL AND LATERAL MENISECTOMY;  Surgeon: Valeria Batman, MD;  Location: [facility];  Service: Orthopedics;  Laterality: Right;   LAPAROSCOPIC CHOLECYSTECTOMY  1998   SKIN GRAFT  Left 2016   took from anterior thigh; placed at elbow   TONSILLECTOMY  ~ 2000   TOTAL ABDOMINAL HYSTERECTOMY  2003   WRIST SURGERY Right 01/2016   Family History Family History  Problem Relation Age of Onset   Liver disease Mother    Dementia Mother    Cirrhosis Mother    Prostate cancer Father    Colon cancer Maternal Grandmother    Ovarian cancer Maternal Aunt     Social History Social History   Tobacco Use   Smoking status: Never   Smokeless tobacco: Never  Vaping Use   Vaping status: Never Used  Substance Use Topics   Alcohol use: Not Currently    Alcohol/week: 0.0 standard drinks of alcohol   Drug use: No   Allergies Droperidol, Ondansetron, Zofran [ondansetron hcl], Benzonatate, Bromfed dm [pseudoeph-bromphen-dm], Diclofenac sodium, Flexeril [cyclobenzaprine], Morphine, and Morphine and codeine  Review of Systems Review of Systems  Gastrointestinal:  Positive for abdominal pain, nausea and vomiting.    Physical Exam Vital Signs  I have reviewed the triage vital signs BP 118/81 (BP Location: Right Arm)   Pulse (!) 107   Temp 98.4 F (36.9 C) (Oral)   Resp 18   Ht 5\' 4"  (1.626 m)   Wt 78.9 kg   SpO2 99%   BMI 29.87 kg/m   Physical Exam Vitals and nursing note reviewed.  Constitutional:      General: She is not in acute distress.    Appearance: She is well-developed.  HENT:     Head: Normocephalic and atraumatic.  Eyes:     Conjunctiva/sclera: Conjunctivae normal.  Cardiovascular:     Rate and Rhythm: Normal rate and  regular rhythm.     Heart sounds: No murmur heard. Pulmonary:     Effort: Pulmonary effort is normal. No respiratory distress.     Breath sounds: Normal breath sounds.  Abdominal:     Palpations: Abdomen is soft.     Tenderness: There is abdominal tenderness.  Musculoskeletal:        General: No swelling.     Cervical back: Neck supple.  Skin:    General: Skin is warm and dry.     Capillary Refill: Capillary refill takes less than 2 seconds.  Neurological:     Mental Status: She is alert.  Psychiatric:        Mood and Affect: Mood normal.     ED Results and Treatments Labs (all labs ordered are listed, but only abnormal results are displayed) Labs Reviewed  CBC WITH DIFFERENTIAL/PLATELET - Abnormal; Notable for the following components:      Result Value   RBC 3.74 (*)    Hemoglobin 11.4 (*)    HCT 35.2 (*)    RDW 16.2 (*)    All other components within normal limits  COMPREHENSIVE METABOLIC PANEL - Abnormal; Notable for the following components:   CO2 21 (*)    Glucose, Bld 121 (*)    BUN 37 (*)    All other components within normal limits  URINALYSIS, ROUTINE W REFLEX MICROSCOPIC - Abnormal; Notable for the following components:   Color, Urine COLORLESS (*)    APPearance HAZY (*)    All other components within normal limits  RESP PANEL BY RT-PCR (RSV, FLU A&B, COVID)  RVPGX2  LIPASE, BLOOD  PROTIME-INR  TYPE AND SCREEN  Radiology No results found.  Pertinent labs & imaging results that were available during my care of the patient were reviewed by me and considered in my medical decision making (see MDM for details).  Medications Ordered in ED Medications  prochlorperazine (COMPAZINE) injection 10 mg (has no administration in time range)  diphenhydrAMINE (BENADRYL) injection 12.5 mg (has no administration in time range)  lactated ringers  bolus 1,000 mL (has no administration in time range)  pantoprazole (PROTONIX) injection 40 mg (has no administration in time range)                                                                                                                                     Procedures .Ultrasound ED Peripheral IV (Provider)  Date/Time: 10/26/2023 9:57 AM  Performed by: Glendora Score, MD Authorized by: Glendora Score, MD   Procedure details:    Indications: multiple failed IV attempts     Skin Prep: chlorhexidine gluconate     Location: R brachial.   Angiocath:  18 G   Bedside Ultrasound Guided: Yes     Images: not archived     Patient tolerated procedure without complications: Yes     Dressing applied: Yes     (including critical care time)  Medical Decision Making / ED Course   This patient presents to the ED for concern of vomiting, hematemesis, this involves an extensive number of treatment options, and is a complaint that carries with it a high risk of complications and morbidity.  The differential diagnosis includes upper GI bleed including PUD, varices, boerhaave's, Mallory Weiss tear, aortoenteric fistula versus lower GI bleed including mass, diverticulosis/diverticulitis, hemorrhoids, anal fissure, mesenteric ischemia, aortoenteric fistula, colitis  MDM: Patient seen emergency room for evaluation of abdominal pain, vomiting and hematemesis.  Physical exam with generalized abdominal tenderness worse in the epigastrium but is otherwise unremarkable.  Laboratory evaluation with a stable hemoglobin at 11.4, INR is normal.  Laboratory evaluation otherwise unremarkable.  CT abdomen pelvis unremarkable.  Symptoms improved with Compazine and Benadryl.  PPI initiated and I spoke with the gastroenterologist on-call Dr. [name] who is recommending n.p.o. status and likely endoscopy in the morning.  Patient admitted.   Additional history obtained: -External records from outside source obtained and  reviewed including: Chart review including previous notes, labs, imaging, consultation notes   Lab Tests: -I ordered, reviewed, and interpreted labs.   The pertinent results include:   Labs Reviewed  CBC WITH DIFFERENTIAL/PLATELET - Abnormal; Notable for the following components:      Result Value   RBC 3.74 (*)    Hemoglobin 11.4 (*)    HCT 35.2 (*)    RDW 16.2 (*)    All other components within normal limits  COMPREHENSIVE METABOLIC PANEL - Abnormal; Notable for the following components:   CO2 21 (*)    Glucose, Bld 121 (*)    BUN 37 (*)    All  other components within normal limits  URINALYSIS, ROUTINE W REFLEX MICROSCOPIC - Abnormal; Notable for the following components:   Color, Urine COLORLESS (*)    APPearance HAZY (*)    All other components within normal limits  RESP PANEL BY RT-PCR (RSV, FLU A&B, COVID)  RVPGX2  LIPASE, BLOOD  PROTIME-INR  TYPE AND SCREEN    Imaging Studies ordered: I ordered imaging studies including CTAP I independently visualized and interpreted imaging. I agree with the radiologist interpretation   Medicines ordered and prescription drug management: Meds ordered this encounter  Medications   prochlorperazine (COMPAZINE) injection 10 mg   diphenhydrAMINE (BENADRYL) injection 12.5 mg   lactated ringers bolus 1,000 mL   pantoprazole (PROTONIX) injection 40 mg    -I have reviewed the patients home medicines and have made adjustments as needed  Critical interventions none  Consultations Obtained: I requested consultation with the gastroenterologist on-call,  and discussed lab and imaging findings as well as pertinent plan - they recommend: Medical admission, endoscopy tomorrow   Cardiac Monitoring: The patient was maintained on a cardiac monitor.  I personally viewed and interpreted the cardiac monitored which showed an underlying rhythm of: NSR  Social Determinants of Health:  Factors impacting patients care include:  none   Reevaluation: After the interventions noted above, I reevaluated the patient and found that they have :improved  Co morbidities that complicate the patient evaluation  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HCC)    Poisoning, snake bite 04/08/2016   "[place]; RUE"   PONV (postoperative nausea and vomiting)    S/P right knee surgery    Situational anxiety    Staph infection ~ 2015   "left elbow and finger"   Thyroid mass       Dispostion: I considered admission for this patient, and patient require hospital admission for hematemesis requiring endoscopy     Final Clinical Impression(s) / ED Diagnoses Final diagnoses:  None     @PCDICTATION @    Glendora Score, MD 10/26/23 1323

## 2023-10-26 NOTE — ED Triage Notes (Signed)
Pt reports she started vomiting on Tuesday with streaks of blood. Yesterday she threw up 3-4 times with blood clots at times. Pt also c/o worsening abdominal pain. Hx of ulcers. Pt called her PCP and was given Phenergan but was told to come to the ED if it got worse. Pt reports after eating toast this morning around 0400 she immediately starting vomiting. Pt reports she isn't able to keep anything down including the Phenergan.

## 2023-10-26 NOTE — Interval H&P Note (Signed)
History and Physical Interval Note:  10/26/2023 4:07 PM  Sabrina [name]  has presented today for surgery, with the diagnosis of epigastric pain, vomiting, hematemesis, dysphagia.  The various methods of treatment have been discussed with the patient and family. After consideration of risks, benefits and other options for treatment, the patient has consented to  Procedure(s): ESOPHAGOGASTRODUODENOSCOPY (EGD) WITH PROPOFOL (N/A) ESOPHAGEAL DILATION (N/A) as a surgical intervention.  The patient's history has been reviewed, patient examined, no change in status, stable for surgery.  I have reviewed the patient's chart and labs.  Questions were answered to the patient's satisfaction.     Juanetta Beets [name]

## 2023-10-26 NOTE — ED Notes (Signed)
Patient transported to CT

## 2023-10-26 NOTE — Op Note (Signed)
[facility] Patient Name: Sabrina [name] Procedure Date: 10/26/2023 3:49 PM MRN: [id number] Date of Birth: 03/26/1966 Attending MD: Sanjuan Dame , MD, [id number] CSN: [id number] Age: 58 Admit Type: Outpatient Procedure:                Upper GI endoscopy Indications:              Hematemesis Providers:                Sanjuan Dame, MD, Francoise Ceo RN, RN, Dyann Ruddle Referring MD:              Medicines:                Monitored Anesthesia Care Complications:            No immediate complications. Estimated Blood Loss:     Estimated blood loss was minimal. Procedure:                Pre-Anesthesia Assessment:                           - Prior to the procedure, a History and Physical                            was performed, and patient medications and                            allergies were reviewed. The patient's tolerance of                            previous anesthesia was also reviewed. The risks                            and benefits of the procedure and the sedation                            options and risks were discussed with the patient.                            All questions were answered, and informed consent                            was obtained. Prior Anticoagulants: The patient has                            taken no anticoagulant or antiplatelet agents. ASA                            Grade Assessment: II - A patient with mild systemic                            disease. After reviewing the risks and benefits,                            the patient was deemed in satisfactory condition to  undergo the procedure.                           After obtaining informed consent, the endoscope was                            passed under direct vision. Throughout the                            procedure, the patient's blood pressure, pulse, and                            oxygen saturations were monitored continuously. The                             [id number] ([id number]) scope was introduced through the                            mouth, and advanced to the second part of duodenum.                            The upper GI endoscopy was accomplished without                            difficulty. The patient tolerated the procedure                            well. Scope In: 4:09:43 PM Scope Out: 4:18:56 PM Total Procedure Duration: 0 hours 9 minutes 13 seconds  Findings:      LA Grade A (one or more mucosal breaks less than 5 mm, not extending       between tops of 2 mucosal folds) esophagitis was found in the lower       third of the esophagus.      Inflammation characterized by erosions was found in the gastric antrum.       Biopsies were taken with a cold forceps for histology.      A 2 cm hiatal hernia was present.      Lymphangiectasia was present in the duodenal bulb and in the second       portion of the duodenum. Biopsies for histology were taken with a cold       forceps for evaluation of celiac disease. Impression:               - LA Grade A reflux esophagitis.                           - Gastritis. Biopsied.                           - 2 cm hiatal hernia.                           - Duodenal mucosal lymphangiectasia. Moderate Sedation:      Per Anesthesia Care Recommendation:           Restart diet as tolerated  PPI daily for 8 weeks                           Avoid NSAIDs                           Follow up path results                           Follow up in GI clinic , if dysphagia persist may                            need esopahgeal biospies and HRM Procedure Code(s):        --- Professional ---                           [phone], Esophagogastroduodenoscopy, flexible,                            transoral; with biopsy, single or multiple Diagnosis Code(s):        --- Professional ---                           K21.00, Gastro-esophageal reflux disease with                             esophagitis, without bleeding                           K29.70, Gastritis, unspecified, without bleeding                           I89.0, Lymphedema, not elsewhere classified                           K92.0, Hematemesis CPT copyright 2022 [employer]. All rights reserved. The codes documented in this report are preliminary and upon coder review may  be revised to meet current compliance requirements. Sanjuan Dame, MD Sanjuan Dame, MD 10/26/2023 4:26:08 PM This report has been signed electronically. Number of Addenda: 0

## 2023-10-26 NOTE — Consult Note (Signed)
Gastroenterology Consult   Referring Provider: No ref. provider found Primary Care Physician:  Randel Pigg, Dorma Russell, MD Primary Gastroenterologist:  [facility], [place], [place]  Patient ID: [name]; [id number]; [birth year]   Admit date: 10/26/2023  LOS: 0 days   Date of Consultation: 10/26/2023  Reason for Consultation:  hematemesis  History of Present Illness   CALLEEN ALVIS is a 58 y.o. 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  In the ED: Hemoglobin 11.4, hematocrit 35.2, white blood cell count 4200, platelets 219,000, MCV 94.1, BUN 37, creatinine 0.81, LFTs normal, lipase 50 normal, INR 0.9, respiratory panel ordered.  CT abdomen pelvis with no acute findings.   GI consult:  Patient presenting with 2-day history of acute onset epigastric pain associated with vomiting.  Initially she noted streaks of blood in the emesis.  Symptoms progressed to vomiting blood clots.  PCP urged her to come to the emergency department yesterday evening but she seemed to be improving towards the evening hours.  This morning she woke up around 3 AM and actually felt hungry and try to eat a piece of toast.  After a couple of bites she started vomiting blood again.  Really denies any typical heartburn symptoms.  She states that she typically takes Protonix daily and famotidine as well.  Denies any NSAID or aspirin use given her history of gastritis.  EGD in 2023 with gastritis felt to be NSAID related at that time.  She denies any constipation or diarrhea.  No melena or rectal bleeding.  She complains of dysphagia about 80% of the time she is eating solid foods.  Denies any pill or liquid  dysphagia.  She states she has a history of thyroid nodule and not sure if this is contributing.  She has a history of iron deficiency anemia, has not been able to take oral iron due to nausea.  She worries that she may start having withdrawal symptoms from her chronic medications that she has not been able to keep it down.  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    Prior data:  Labs from January 2025: Iron 49, ferritin 10, TIBC 566, iron sats 9%.  Hemoglobin 11.4, MCV 89.6.  Labs from October 2023: B12 570.  CT chest without contrast December 2024: IMPRESSION: 1. Nondisplaced fracture through the mid body of sternum. There is slight increased sclerosis along the fracture margins suggesting subacute injury. Correlation for any focal tenderness over this area is advised. 2. 1.8 cm nodule arising off the inferior pole of the left lobe of thyroid gland. Recommend thyroid US (ref: J Am Coll Radiol. 2015 Feb;12(2): 143-50). 3. Multiple liver cysts. 4.  Aortic Atherosclerosis (ICD10-I70.0).  EGD 02/2022: -Esophagus normal -Mild erythema and several erosions in the gastric antrum, status post biopsy, focal minimal chronic inflammation and patchy mild reactive glandular changes.  H. pylori negative. -first and second portion duodenum appeared normal  Colonoscopy 02/2022: -Terminal ileum for 10 cm normal -colon normal -Medium internal hemorrhoids -Repeat colonoscopy in 10 years   Prior to Admission medications   Medication Sig Start Date End Date Taking? Authorizing Provider  atorvastatin (LIPITOR) 20 MG tablet TAKE 1 TABLET BY  MOUTH ONCE A DAY 03/31/21 03/31/22  Philip Aspen, Limmie Patricia, MD  butalbital-acetaminophen-caffeine (FIORICET) [phone] MG tablet TAKE ONE TABLET BY MOUTH EVERY 6 HOURS AS NEEDED FOR HEADACHE 05/02/22   Drema Dallas, DO  cetirizine (ZYRTEC) 10 MG tablet Take 1 tablet (10 mg total) by mouth daily. 09/30/23    Leath-Warren, Sadie Haber, NP  diazepam (VALIUM) 10 MG tablet 1 qam  2 qhs 07/12/23   Plovsky, Earvin Hansen, MD  diazepam (VALIUM) 5 MG/ML solution Take by mouth every 8 (eight) hours as needed for anxiety.    [provider]  erythromycin ophthalmic ointment Place a 1/2 inch ribbon of ointment into the left lower eyelid BID prn. 07/19/23   Particia Nearing, PA-C  folic acid (FOLVITE) 1 MG tablet Take 1 mg by mouth daily.    [provider]  gabapentin (NEURONTIN) 300 MG capsule Take 2 capsules (600 mg total) by mouth 3 (three) times daily. 08/10/21   Philip Aspen, Limmie Patricia, MD  methotrexate (RHEUMATREX) 2.5 MG tablet Take 2.5 mg by mouth once a week. Caution:Chemotherapy. Protect from light.    [provider]  oxyCODONE (ROXICODONE) 5 MG immediate release tablet Take 1 tablet (5 mg total) by mouth every 4 (four) hours as needed for severe pain (pain score 7-10). 08/14/23   Kommor, Wyn Forster, MD        [provider]  rosuvastatin (CRESTOR) 20 MG tablet Take 1 tablet by mouth daily. 06/08/22   [provider]  venlafaxine XR (EFFEXOR-XR) 150 MG 24 hr capsule Take 1 capsule (150 mg total) by mouth every morning. 07/12/23 07/11/24  Archer Asa, MD    No current facility-administered medications for this encounter.   Current Outpatient Medications  Medication Sig Dispense Refill   atorvastatin (LIPITOR) 20 MG tablet TAKE 1 TABLET BY MOUTH ONCE A DAY 90 tablet 1   butalbital-acetaminophen-caffeine (FIORICET) 50-325-40 MG tablet TAKE ONE TABLET BY MOUTH EVERY 6 HOURS AS NEEDED FOR HEADACHE 10 tablet 2   cetirizine (ZYRTEC) 10 MG tablet Take 1 tablet (10 mg total) by mouth daily. 30 tablet 0   diazepam (VALIUM) 10 MG tablet 1 qam  2 qhs 90 tablet 3   diazepam (VALIUM) 5 MG/ML solution Take by mouth every 8 (eight) hours as needed for anxiety.     erythromycin ophthalmic ointment Place a 1/2 inch ribbon of ointment into the left lower eyelid BID prn.  3.5 g 0   folic acid (FOLVITE) 1 MG tablet Take 1 mg by mouth daily.     gabapentin (NEURONTIN) 300 MG capsule Take 2 capsules (600 mg total) by mouth 3 (three) times daily. 540 capsule 1   methotrexate (RHEUMATREX) 2.5 MG tablet Take 2.5 mg by mouth once a week. Caution:Chemotherapy. Protect from light.     oxyCODONE (ROXICODONE) 5 MG immediate release tablet Take 1 tablet (5 mg total) by mouth every 4 (four) hours as needed for severe pain (pain score 7-10). 10 tablet 0   predniSONE (DELTASONE) 20 MG tablet Take 20 mg by mouth daily with breakfast.     rosuvastatin (CRESTOR) 20 MG tablet Take 1 tablet by mouth daily.     venlafaxine XR (EFFEXOR-XR) 150 MG 24 hr capsule Take 1 capsule (150 mg total) by mouth every morning. 90 capsule 1    Allergies as of 10/26/2023 - Review Complete 10/26/2023  Allergen Reaction Noted   Droperidol Palpitations and Other (See Comments) 07/01/2015   Ondansetron Rash, Swelling, and Hives 03/09/2015   Zofran Frazier Richards  hcl] Hives and Other (See Comments) 03/09/2015   Benzonatate Other (See Comments) and Rash 09/21/2015   Bromfed dm [pseudoeph-bromphen-dm] Other (See Comments) 09/17/2021   Diclofenac sodium Rash 08/14/2018   Flexeril [cyclobenzaprine] Anxiety 01/18/2017   Morphine Itching 07/13/2015   Morphine and codeine Itching 11/09/2013    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HCC)    Poisoning, snake bite 04/08/2016   "[place]; RUE"   PONV (postoperative nausea and vomiting)    S/P right knee surgery    Situational anxiety    Staph infection ~ 2015   "left elbow and finger"   Thyroid mass     Past Surgical History:  Procedure Laterality Date    APPENDECTOMY  ~ 1987   APPLICATION OF A-CELL OF EXTREMITY Left 08/05/2015   Procedure: APPLICATION OF A-CELL OF EXTREMITY;  Surgeon: Peggye Form, DO;  Location: [facility];  Service: Plastics;  Laterality: Left;   BREAST SURGERY Right 1990   "milk duct taken out"   CHONDROPLASTY Right 02/19/2019   Procedure: CHONDROPLASTY; EXCISION EXOSTOSIS;  Surgeon: Valeria Batman, MD;  Location: [facility];  Service: Orthopedics;  Laterality: Right;   DEBRIDEMENT AND CLOSURE WOUND Left 07/01/2015   Procedure: LEFT ELBOW EXCISION OF WOUND WITH PRIMARY CLOSURE 2X5 CM ;  Surgeon: Peggye Form, DO;  Location: [facility];  Service: Plastics;  Laterality: Left;   ELBOW SURGERY Left X 23 in [place] <06/2015   from a cat bite; all I&D   I & D EXTREMITY Left 07/08/2015   Procedure: IRRIGATION AND DEBRIDEMENT EXTREMITY, DRAINAGE OF LEFT ARM WOUND, A-CELL PLACEMENT, WOUND VAC PLACEMENT;  Surgeon: Alena Bills Dillingham, DO;  Location: [facility];  Service: Plastics;  Laterality: Left;   INCISION AND DRAINAGE OF WOUND Left 08/05/2015   Procedure: IRRIGATION AND DEBRIDEMENT LEFT ELBOW WOUND, PLACEMENT OF ACELL;  Surgeon: Peggye Form, DO;  Location: [facility];  Service: Plastics;  Laterality: Left;   KNEE ARTHROSCOPY WITH MEDIAL MENISECTOMY Right 02/19/2019   Procedure: RIGHT KNEE ARTHROSCOPY, DEBRIDEMENT, PARTIAL MEDIAL AND LATERAL MENISECTOMY;  Surgeon: Valeria Batman, MD;  Location:  SURGERY CENTER;  Service: Orthopedics;  Laterality: Right;   LAPAROSCOPIC CHOLECYSTECTOMY  1998   SKIN GRAFT Left 2016   took from anterior thigh; placed at elbow   TONSILLECTOMY  ~ 2000   TOTAL ABDOMINAL HYSTERECTOMY  2003   WRIST SURGERY Right 01/2016    Family History  Problem Relation Age of Onset   Liver disease Mother    Dementia Mother    Cirrhosis Mother    Prostate cancer Father    Colon cancer Maternal Grandmother    Ovarian  cancer Maternal Aunt     Social History   Socioeconomic History   Marital status: Divorced    Spouse name: Not on file   Number of children: 1   Years of education: Not on file   Highest education level: Associate degree: academic program  Occupational History   Occupation: unemployed  Tobacco Use   Smoking status: Never   Smokeless tobacco: Never  Vaping Use   Vaping status: Never Used  Substance and Sexual Activity   Alcohol use: Not Currently    Alcohol/week: 0.0 standard drinks of alcohol   Drug use: No   Sexual activity: Not on file  Other Topics Concern   Not on file  Social History Narrative   Pateint is right-handed. She lives alone in a single level home. She rarely drinks caffeine. She is limited to exercise due to knee injuries.   Social Drivers of Health   Financial Resource Strain: High Risk (06/05/2022)   [facility] to [facility]12[facility] prior to 11/12/2022.   Overall Financial Resource Strain (CARDIA)    Difficulty of Paying Living Expenses: Hard  Food Insecurity: Low Risk  (10/02/2023)   [facility]   Hunger Vital Sign    Worried About Running Out of Food in the Last Year: Never true    Ran Out of Food in the Last Year: Never true  Transportation Needs: No Transportation Needs (10/02/2023)   Received from [employer]    In the past 12 months, has lack of reliable transportation kept you from medical appointments, meetings, work or from getting things needed for daily living? : No  Physical Activity: Inactive (06/05/2022)   [facility] prior to 11/12/2022[facility] visits prior to 11/12/2022.   Exercise Vital Sign    Days of Exercise per Week: 0 days    Minutes of Exercise per Session: 10 min  Stress: Stress Concern  Present (06/05/2022)   [facility] prior to 11/12/2022., [facility] visits prior to 11/12/2022.   [employer] of Occupational Health - Occupational Stress Questionnaire    Feeling of Stress : Rather much  Social Connections: Moderately Isolated (06/05/2022)   [facility] to 11/12/2022., [facility] visits prior to 11/12/2022.   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]    Frequency of Communication with Friends and Family: More than three times a week    Frequency of Social Gatherings with Friends and Family: Twice a week    Attends Religious Services: More than 4 times per year    Active Member of [employer] or Organizations: No    Attends Banker Meetings: Patient declined    Marital Status: Divorced  Catering manager Violence: Not At Risk (06/05/2022)   [facility] prior to 11/12/2022., [facility] visits prior to 11/12/2022.   Humiliation, Afraid, Rape, and Kick questionnaire    Fear of Current or Ex-Partner: No    Emotionally Abused: No    Physically Abused: No    Sexually Abused: No     Review of System:   General: Negative for anorexia, weight loss, fever, chills, fatigue, weakness. Eyes: Negative for vision changes.  ENT: Negative for hoarseness, difficulty swallowing , nasal congestion. CV: Negative for chest pain, angina, palpitations, dyspnea on exertion, peripheral edema.  Respiratory: Negative for dyspnea at rest, dyspnea on exertion, cough, sputum, wheezing.  GI: See history of present illness. GU:  Negative for dysuria, hematuria, urinary incontinence, urinary frequency, nocturnal urination.  MS: chronic left arm pain/numbness managed with gabapentin. Negative for low back pain.  Derm: Negative for rash or  itching.  Neuro: Negative for weakness, abnormal sensation, seizure, frequent headaches, memory loss, confusion.  Psych: Negative for anxiety, depression, suicidal ideation, hallucinations.  Endo: Negative for unusual weight change.  Heme: Negative for bruising or bleeding. Allergy: Negative for rash or hives.      Physical Examination:   Vital signs in last 24 hours: Temp:  [98.4 F (36.9 C)] 98.4 F (36.9 C) (02/13 0754) Pulse Rate:  [86-107] 87 (02/13 1200) Resp:  [14-18] 16 (02/13 1200) BP: (98-118)/(71-81) 112/72 (02/13 1200) SpO2:  [94 %-99 %] 98 % (02/13 1200) Weight:  [78.9 kg] 78.9 kg (02/13 0750)    General: Well-nourished, well-developed in no acute distress.  Head: Normocephalic, atraumatic.   Eyes: Conjunctiva pink, no icterus. Mouth: Oropharyngeal mucosa moist and pink   Neck: Supple without thyromegaly, masses, or lymphadenopathy.  Lungs: Clear to auscultation bilaterally.  Heart: Regular rate and rhythm, no murmurs rubs or gallops.  Abdomen: Bowel sounds are normal,  nondistended, no hepatosplenomegaly or masses, no abdominal bruits or hernia , no rebound or guarding.  Mild-mod epigastric tenderness Rectal: not performed Extremities: No lower extremity edema, clubbing, deformity.  Neuro: Alert and oriented x 4 , grossly normal neurologically.  Skin: Warm and dry, no rash or jaundice.   Psych: Alert and cooperative, normal mood and affect.        Intake/Output from previous day: No intake/output data recorded. Intake/Output this shift: No intake/output data recorded.  Lab Results:   CBC Recent Labs    10/26/23 0827  WBC 4.2  HGB 11.4*  HCT 35.2*  MCV 94.1  PLT 219   BMET Recent Labs    10/26/23 0827  NA 139  K 3.9  CL 111  CO2 21*  GLUCOSE 121*  BUN 37*  CREATININE 0.81  CALCIUM 9.1   LFT Recent Labs    10/26/23 0827  BILITOT 0.3  ALKPHOS 121  AST 31  ALT 25  PROT 7.1  ALBUMIN 3.8    Lipase Recent Labs    10/26/23 0827   LIPASE 50    PT/INR Recent Labs    10/26/23 0827  LABPROT 12.2  INR 0.9     Hepatitis Panel No results for input(s): "HEPBSAG", "HCVAB", "HEPAIGM", "HEPBIGM" in the last 72 hours.   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  Hematemesis/abdominal pain: -acute onset symptoms two days ago, ?viral or food poisoning with bleeding due to M-W tear but patient is at risk for gastritis/PUD with history of chronic intermittent steroids, stopped about one month ago.  -CT reassuring -EGD today.  I have discussed the risks, alternatives, benefits with regards to but not limited to the risk of reaction to medication, bleeding, infection, perforation and the patient is agreeable to proceed. Written consent to be obtained.  Solid food dysphagia: -occurring 80% of the time she is eating -EGD today, possible esophageal dilation if appropriate  IDA: -Hgb 11.4 09/2023, same today.  -ferritin 10, TIBC 566, iron 49, fe sat 9, B12 normal 2023 -colonoscopy/EGD 02/2022 as outlined -await EGD findings    LOS: 0 days   We would like to thank you for the opportunity to participate in the care of [name].  Leanna Battles. Melvyn Neth,  [facility] [phone] 2/13/202512:45 PM

## 2023-10-26 NOTE — ED Notes (Signed)
Pt transported to Endo for EGD

## 2023-10-26 NOTE — H&P (Signed)
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 Kozikowski Prude and was taken off methotrexate around end 1.2025 Prior UTI Multinodular goiter with solitary nodule [? Under surveillance] Depression/anxiety Previous chronic knee pain secondary to an injury in the 90s with complex tear medial meniscus status post repair 2020 [name]  2/13 present with 3 day history of vomiting-States that this for started 2/11  Large chunks what look like clots -- unable to keep down food despite being prescribed Phenergan by primary care physician and unable to keep that down No dark stool no tarry stool no melena-had endoscopy/colonoscopy performed at [facility] by [name] 03/10/2022-mild erythema and erosions in gastric antrum H. pylori was ruled out first and second part duodenum normal was told to avoid NSAIDs She is not having any chest pain fever chills cough cold sputum or any other symptoms She has not been able to keep anything down as above She tells me that she does not really take any nonsteroidals but has in the past because of migraines taken butalbital for her migraines She has not taken any other over-the-counter   Review of Systems:  As mentioned above in HPI are pertinent +'s Pertinent negatives as per below  ED Course: Given Compazine injection 10 mg LR bolus Benadryl injection GI consulted started on pantoprazole given 1 injection 40 mg   Past Medical History:  Diagnosis Date   Arthritis    "knees" (09/06/2017)   Bullous pemphigus    Cat bite 06/2014   to left elbow   History of blood transfusion 1988   "when I had my baby"    Muscle weakness of lower extremity 2001; 09/05/2017   "resolved after a couple weeks; ?" (09/06/2017)   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HCC)    Poisoning, snake bite 04/08/2016   "[place]; RUE"   PONV (postoperative nausea and vomiting)    S/P right knee surgery    Situational anxiety    Staph infection ~ 2015   "left elbow and finger"   Thyroid mass    Past Surgical History:  Procedure Laterality Date   APPENDECTOMY  ~ 1987   APPLICATION OF A-CELL OF EXTREMITY Left 08/05/2015   Procedure: APPLICATION OF A-CELL OF EXTREMITY;  Surgeon: Peggye Form, DO;  Location: [facility];  Service: Plastics;  Laterality: Left;   BREAST SURGERY Right 1990   "milk duct taken out"   CHONDROPLASTY Right 02/19/2019   Procedure: CHONDROPLASTY; EXCISION EXOSTOSIS;  Surgeon: Valeria Batman, MD;  Location: [facility];  Service: Orthopedics;  Laterality: Right;   DEBRIDEMENT AND CLOSURE WOUND Left 07/01/2015   Procedure: LEFT ELBOW EXCISION OF WOUND WITH PRIMARY CLOSURE 2X5 CM ;  Surgeon: Peggye Form, DO;  Location: [facility];  Service: Plastics;  Laterality: Left;   ELBOW SURGERY Left X 23 in [place] <06/2015   from a cat bite; all I&D   I & D EXTREMITY Left 07/08/2015   Procedure: IRRIGATION AND DEBRIDEMENT EXTREMITY, DRAINAGE OF LEFT ARM WOUND, A-CELL PLACEMENT, WOUND VAC PLACEMENT;  Surgeon: Alena Bills Dillingham, DO;  Location: [facility];  Service: Plastics;  Laterality: Left;  INCISION AND DRAINAGE OF WOUND Left 08/05/2015   Procedure: IRRIGATION AND DEBRIDEMENT LEFT ELBOW WOUND, PLACEMENT OF ACELL;  Surgeon: Peggye Form, DO;  Location: [facility];  Service: Plastics;  Laterality: Left;   KNEE ARTHROSCOPY WITH MEDIAL MENISECTOMY Right 02/19/2019   Procedure: RIGHT KNEE ARTHROSCOPY, DEBRIDEMENT, PARTIAL MEDIAL AND LATERAL MENISECTOMY;  Surgeon: Valeria Batman, MD;  Location: [facility];  Service: Orthopedics;   Laterality: Right;   LAPAROSCOPIC CHOLECYSTECTOMY  1998   SKIN GRAFT Left 2016   took from anterior thigh; placed at elbow   TONSILLECTOMY  ~ 2000   TOTAL ABDOMINAL HYSTERECTOMY  2003   WRIST SURGERY Right 01/2016    reports that she has never smoked. She has never used smokeless tobacco. She reports that she does not currently use alcohol. She reports that she does not use drugs.  Mobility: independent   Allergies  Allergen Reactions   Droperidol Palpitations and Other (See Comments)    Other Reaction(s): Not available    Elevates BP increases HR Elevates BP increases HR    Elevates BP increases HR   Ondansetron Rash, Swelling and Hives    Spots around iv site  IV zofran  Other Reaction(s): Not available, Other (See Comments)    Spots around iv site IV zofran Spots around iv site IV zofran Spots around iv site    Spots around iv site IV zofran    Spots around iv site   Zofran [Ondansetron Hcl] Hives and Other (See Comments)    Spots around iv site   Benzonatate Other (See Comments) and Rash    Conjunctivitis, watery eyes.  Other Reaction(s): Not available, Other (See Comments)    Watery eyes Vision changes Caused burning pain in eyes Watery eyes Vision changes Conjunctivitis, watery eyes. Conjunctivitis, watery eyes.    Caused burning pain in eyes Watery eyes Vision changes Conjunctivitis, watery eyes.    Watery eyes    Vision changes    Conjunctivitis, watery eyes.   Bromfed Dm [Pseudoeph-Bromphen-Dm] Other (See Comments)    anxiety   Diclofenac Sodium Rash   Flexeril [Cyclobenzaprine] Anxiety   Morphine Itching   Morphine And Codeine Itching    Pt reports she has had Morphine since the reaction and she didn't have a problem with it.    Family History  Problem Relation Age of Onset   Liver disease Mother    Dementia Mother    Cirrhosis Mother    Prostate cancer Father    Colon cancer Maternal Grandmother    Ovarian cancer Maternal Aunt    Prior to  Admission medications   Medication Sig Start Date End Date Taking? Authorizing Provider  atorvastatin (LIPITOR) 20 MG tablet TAKE 1 TABLET BY MOUTH ONCE A DAY 03/31/21 03/31/22  Philip Aspen, Limmie Patricia, MD  butalbital-acetaminophen-caffeine (FIORICET) [phone] MG tablet TAKE ONE TABLET BY MOUTH EVERY 6 HOURS AS NEEDED FOR HEADACHE 05/02/22   Everlena Cooper, Adam R, DO  cetirizine (ZYRTEC) 10 MG tablet Take 1 tablet (10 mg total) by mouth daily. 09/30/23   Leath-Warren, Sadie Haber, NP  diazepam (VALIUM) 10 MG tablet 1 qam  2 qhs 07/12/23   Plovsky, Earvin Hansen, MD  diazepam (VALIUM) 5 MG/ML solution Take by mouth every 8 (eight) hours as needed for anxiety.    [provider]  erythromycin ophthalmic ointment Place a 1/2 inch ribbon of ointment into the left lower eyelid BID prn. 07/19/23   Particia Nearing, PA-C  folic acid (FOLVITE) 1 MG  tablet Take 1 mg by mouth daily.    [provider]  gabapentin (NEURONTIN) 300 MG capsule Take 2 capsules (600 mg total) by mouth 3 (three) times daily. 08/10/21   Philip Aspen, Limmie Patricia, MD  methotrexate (RHEUMATREX) 2.5 MG tablet Take 2.5 mg by mouth once a week. Caution:Chemotherapy. Protect from light.    [provider]  oxyCODONE (ROXICODONE) 5 MG immediate release tablet Take 1 tablet (5 mg total) by mouth every 4 (four) hours as needed for severe pain (pain score 7-10). 08/14/23   Kommor, Madison, MD  predniSONE (DELTASONE) 20 MG tablet Take 20 mg by mouth daily with breakfast.    [provider]  rosuvastatin (CRESTOR) 20 MG tablet Take 1 tablet by mouth daily. 06/08/22   [provider]  venlafaxine XR (EFFEXOR-XR) 150 MG 24 hr capsule Take 1 capsule (150 mg total) by mouth every morning. 07/12/23 07/11/24  Archer Asa, MD    Physical Exam:  Vitals:   10/26/23 1130 10/26/23 1200  BP: 98/71 112/72  Pulse: 91 87  Resp: 14 16  Temp:    SpO2: 96% 98%    Awake coherent in no distress looks comfortable EOMI  NCAT no focal deficit no icterus no pallor no wheeze no rales no rhonchi ROM intact moving 4 limbs equally without any focal deficits S1-S2 slight tachycardia Mild epigastric tenderness Power 5/5   I have personally reviewed following labs and imaging studies  Labs:  Sodium 139 potassium 3.9 CO2 21 BUN/creatinine 37/0.8 WBC 4.2 hemoglobin 11.4 platelet 219  Imaging studies:  CT ABD pelvis no acute interval process identified in abdomen--- multiple hepatic cysts however 3 x 3 cm surgically absent gallbladder noncirrhotic-simple cysts in nature apparently  Medical tests:  EKG independently reviewed: None performed  Test discussed with performing physician: No  Decision to obtain old records:  No  Review and summation of old records:  No  Active Problems:   * No active hospital problems. *   Assessment/Plan ?  Acute UGIB  Keep n.p.o.-discussed personally with Ms. Lewis of gastroenterology-plan seems to be for upper endoscopy today  Protonix 40 twice daily, Pepcid 40 IV daily and if erosions or other things are found we may need to add Carafate  Will keep on IV fluid 40 cc/H  If she is able to keep down food we will observe her overnight and ensure hemoglobin and tolerance of diet is adequate prior to being able to discharge  Will resume oxycodone 5 every 4 as needed for pain and can resume gabapentin 600 3 times daily  History of bullous pemphigoid  Now off of any immunomodulators and DMA RD's-outpatient follow-up with her dermatologist at [facility] as above for further management  Volume depletion on admission AKI  Likely secondary to nausea vomiting-fluids as above  Multinodular goiter  Needs TSH T4 as an outpatient and probably will need ultrasound/FNA of this area as an outpatient to be determined  Depression/anxiety  Postprocedure can resume meds Valium 10 mg in the morning and 2 at night-s will need to verify this with pharmacy Continue Effexor 150 every  morning   Severity of Illness: The appropriate patient status for this patient is OBSERVATION. Observation status is judged to be reasonable and necessary in order to provide the required intensity of service to ensure the patient's safety. The patient's presenting symptoms, physical exam findings, and initial radiographic and laboratory data in the context of their medical condition is felt to place them at decreased risk  for further clinical deterioration. Furthermore, it is anticipated that the patient will be medically stable for discharge from the hospital within 2 midnights of admission.    Family Communication: No  DVT ppx: SCD Consults called & Whom: Gastroenterology  Time spent: 35 minutes  [name], MD Cordelia Poche my NP partners at night for Care related issues] [facility] --[employer] OR , [email or web address]; password [facility]  10/26/2023, 1:35 PM

## 2023-10-27 ENCOUNTER — Encounter (HOSPITAL_COMMUNITY): Payer: Self-pay | Admitting: Gastroenterology

## 2023-10-27 DIAGNOSIS — K920 Hematemesis: Secondary | ICD-10-CM | POA: Diagnosis not present

## 2023-10-27 DIAGNOSIS — K922 Gastrointestinal hemorrhage, unspecified: Secondary | ICD-10-CM | POA: Diagnosis present

## 2023-10-27 LAB — CBC
HCT: 31.6 % — ABNORMAL LOW (ref 36.0–46.0)
Hemoglobin: 10.2 g/dL — ABNORMAL LOW (ref 12.0–15.0)
MCH: 30.2 pg (ref 26.0–34.0)
MCHC: 32.3 g/dL (ref 30.0–36.0)
MCV: 93.5 fL (ref 80.0–100.0)
Platelets: 202 10*3/uL (ref 150–400)
RBC: 3.38 MIL/uL — ABNORMAL LOW (ref 3.87–5.11)
RDW: 16.1 % — ABNORMAL HIGH (ref 11.5–15.5)
WBC: 3.7 10*3/uL — ABNORMAL LOW (ref 4.0–10.5)
nRBC: 0 % (ref 0.0–0.2)

## 2023-10-27 LAB — COMPREHENSIVE METABOLIC PANEL
ALT: 24 U/L (ref 0–44)
AST: 27 U/L (ref 15–41)
Albumin: 3.4 g/dL — ABNORMAL LOW (ref 3.5–5.0)
Alkaline Phosphatase: 105 U/L (ref 38–126)
Anion gap: 7 (ref 5–15)
BUN: 25 mg/dL — ABNORMAL HIGH (ref 6–20)
CO2: 20 mmol/L — ABNORMAL LOW (ref 22–32)
Calcium: 8.4 mg/dL — ABNORMAL LOW (ref 8.9–10.3)
Chloride: 111 mmol/L (ref 98–111)
Creatinine, Ser: 0.7 mg/dL (ref 0.44–1.00)
GFR, Estimated: >60 mL/min (ref >60)
Glucose, Bld: 90 mg/dL (ref 70–99)
Potassium: 3.6 mmol/L (ref 3.5–5.1)
Sodium: 138 mmol/L (ref 135–145)
Total Bilirubin: 0.3 mg/dL (ref 0.0–1.2)
Total Protein: 6 g/dL — ABNORMAL LOW (ref 6.5–8.1)

## 2023-10-27 LAB — HIV ANTIBODY (ROUTINE TESTING W REFLEX): HIV Screen 4th Generation wRfx: NONREACTIVE

## 2023-10-27 MED ORDER — FENTANYL CITRATE PF 50 MCG/ML IJ SOSY
25.0000 ug | PREFILLED_SYRINGE | Freq: Once | INTRAMUSCULAR | Status: AC | PRN
  Administered 2023-10-27: 25 ug via INTRAVENOUS
  Filled 2023-10-27: qty 1

## 2023-10-27 MED ORDER — VENLAFAXINE HCL ER 75 MG PO CP24
150.0000 mg | ORAL_CAPSULE | Freq: Every day | ORAL | Status: DC
  Administered 2023-10-27 – 2023-10-28 (×2): 150 mg via ORAL
  Filled 2023-10-27 (×2): qty 2

## 2023-10-27 MED ORDER — GABAPENTIN 300 MG PO CAPS
600.0000 mg | ORAL_CAPSULE | Freq: Three times a day (TID) | ORAL | Status: DC
  Administered 2023-10-27 – 2023-10-28 (×4): 600 mg via ORAL
  Filled 2023-10-27 (×4): qty 2

## 2023-10-27 MED ORDER — SUCRALFATE 1 GM/10ML PO SUSP
1.0000 g | Freq: Three times a day (TID) | ORAL | Status: DC
  Administered 2023-10-27 – 2023-10-28 (×4): 1 g via ORAL
  Filled 2023-10-27 (×4): qty 10

## 2023-10-27 MED ORDER — KETOROLAC TROMETHAMINE 15 MG/ML IJ SOLN
15.0000 mg | Freq: Four times a day (QID) | INTRAMUSCULAR | Status: DC | PRN
  Administered 2023-10-27 (×2): 15 mg via INTRAVENOUS
  Filled 2023-10-27 (×2): qty 1

## 2023-10-27 MED ORDER — OXYCODONE HCL 5 MG PO TABS
5.0000 mg | ORAL_TABLET | ORAL | Status: DC | PRN
  Administered 2023-10-27 – 2023-10-28 (×4): 5 mg via ORAL
  Filled 2023-10-27 (×4): qty 1

## 2023-10-27 MED ORDER — FOLIC ACID 1 MG PO TABS
1.0000 mg | ORAL_TABLET | Freq: Every day | ORAL | Status: DC
  Administered 2023-10-27 – 2023-10-28 (×2): 1 mg via ORAL
  Filled 2023-10-27 (×2): qty 1

## 2023-10-27 MED ORDER — ACETAMINOPHEN 325 MG PO TABS: 650.0000 mg | ORAL_TABLET | Freq: Four times a day (QID) | ORAL | Status: DC | PRN

## 2023-10-27 MED ORDER — PANTOPRAZOLE SODIUM 40 MG PO TBEC: 40.0000 mg | DELAYED_RELEASE_TABLET | Freq: Every morning | ORAL | 2 refills | Status: DC

## 2023-10-27 NOTE — Plan of Care (Signed)

## 2023-10-27 NOTE — Discharge Summary (Addendum)
 Physician Discharge Summary  Sabrina [name] EXB:[id number] DOB: 07-23-1966 DOA: 10/26/2023  PCP: [name], MD  Admit date: 10/26/2023 Discharge date: 10/28/2023   Patient's discharge was delayed due to abdominal pain, that has improved with oral analgesics. No nausea or vomiting. Sabrina [name] morning with no melena or hematochezia.  Sabrina [name] follow up hgb is 10.9  She has used oral oxycodone in the past with good toleration.  She had lunch today, soft diet, with recurrent mild abdominal pain, again improved with oral analgesics.   Will plan to prescribe short course of oral analgesics and continue with antiacids. Follow up as outpatient as scheduled.   Discharge per Dr [name], with the above clarification for today 10/28/23.   Time spent: 27 minutes Recommendations for Outpatient Follow-up:  Check CBC Chem-12 1 week May need to follow-up H. pylori results and path results as may need treatment of Sabrina [name] in the outpatient setting-CC Dr. [name] Recommend outpatient thyroid ultrasound etc. 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 and was taken off methotrexate around end 1.2025 Prior UTI Multinodular goiter with solitary nodule [? Under surveillance] Depression/anxiety Previous chronic knee pain secondary to an injury in the 90s with complex tear medial  meniscus status post repair 2020 [name]   2/13 present with 3 day history of vomiting-States that Sabrina [name] for started 2/11  Large chunks what look like clots -- unable to keep down food despite being prescribed Phenergan by primary care physician and unable to keep that down No dark stool no tarry stool no melena-had endoscopy/colonoscopy performed at [facility] by [name] 03/10/2022-mild erythema and erosions in gastric antrum H. pylori was ruled out first and second part duodenum normal was told to avoid NSAIDs Patient has not been taking Goody powders but has been taking Fioricet for migraines in addition to Sabrina [name] other Bernita Raisin  Because of nausea vomiting and concern for bleeding although hemoglobin stable patient was admitted Dr. 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 pylori to be treated She was volume depleted on arrival but Sabrina [name] improved with saline repletion she did have ATN/AKI which is better She has not had any vomiting although feels nauseous but I have encouraged Sabrina [name] to take a full liquid diet and graduate slowly to a soft diet She will need outpatient follow-up for bullous pemphigoid as she has been discontinued off of Sabrina [name] methotrexate as well as steroids and I feel the steroids might have been a precipitant for some of Sabrina [name] gastritis----she does follow-up with [facility] dermatologist [name] and should be seen by him She has no further indication to stay hospitalized as she seems stable I feel she  is stable for discharge and will need labs in about a week CC PCP CC [name]  Discharge Exam: Vitals:   10/27/23 0300 10/27/23 0700  BP: 111/65 116/80  Pulse: 82 94  Resp: 15 18  Temp: 98 F (36.7 C) 97.9 F  (36.6 C)  SpO2: 97% 97%    Subj on day of d/c   Awake coherent anxious no distress  General Exam on discharge  EOMI NCAT no focal deficit no icterus no pallor no wheeze no rales no rhonchi ROM intact no focal deficit CTAB no added sound Abdomen soft no rebound no guarding Chest is clear S1-S2 no murmur telemetry shows sinus rhythm  Discharge Instructions   Discharge Instructions     Diet - low sodium heart healthy   Complete by: As directed    Discharge instructions   Complete by: As directed    You were diagnosed with gastritis on the scope and Sabrina [name] will heal slowly-there was no active bleed and nothing to worry about from an ulcer or bleeding perspective I would recommend taking Protonix twice a day 40 mg and you should probably use a full liquid diet or soft diet for the time being until these areas heal up-gastroenterology may need to see you in about a month to 2 months time and may want to repeat the scope-they will call if they feel there is any concerns  Do not take nonsteroidals-try to take Tylenol first choice for headaches and Fioricet only as needed for your severe migraines  Should you have high amounts of bleeding, lightheadedness, blurred vision double vision or dizziness weakness present to the emergency room   Increase activity slowly   Complete by: As directed       Allergies as of 10/28/2023       Reactions   Droperidol Palpitations, Other (See Comments)   Elevates BP increases HR   Ondansetron Hives, Swelling, Rash   Spots around IV site IV Zofran   Benzonatate Rash, Other (See Comments)   Caused burning pain in eyes  Watery eyes  Vision changes  Conjunctivitis   Bromfed Dm [pseudoeph-bromphen-dm] Anxiety   Diclofenac Sodium Rash   Flexeril [cyclobenzaprine] Anxiety   Morphine And Codeine Itching   Pt reports she has had Morphine since the reaction and she didn't have a problem with it.         Medication List     STOP taking these  medications    butalbital-acetaminophen-caffeine 50-325-40 MG tablet Commonly known as: FIORICET   methotrexate 2.5 MG tablet Commonly known as: RHEUMATREX       TAKE these medications    diazepam 10 MG tablet Commonly known as: VALIUM 1 qam  2 qhs What changed:  how much to take how to take Sabrina [name] when to take Sabrina [name] reasons to take Sabrina [name] additional instructions   Ferrex 150 150 MG capsule Generic drug: iron polysaccharides Take 150 mg by mouth daily.   gabapentin 300 MG capsule Commonly known as: NEURONTIN Take 2 capsules (600 mg total) by mouth 3 (three) times daily.   oxyCODONE 5 MG immediate release tablet Commonly known as: Oxy IR/ROXICODONE Take 1 tablet (5 mg total) by mouth every 8 (eight) hours as needed for severe pain (pain score 7-10).   pantoprazole 40 MG tablet Commonly known as: PROTONIX Take 1 tablet (40 mg total) by mouth every morning.   promethazine 12.5 MG tablet Commonly known as: PHENERGAN Take 12.5 mg by mouth every 6 (six) hours as needed for  nausea or vomiting.   rosuvastatin 20 MG tablet Commonly known as: CRESTOR Take 1 tablet by mouth daily.   tiZANidine 4 MG tablet Commonly known as: ZANAFLEX Take 4 mg by mouth at bedtime as needed for muscle spasms.   Ubrogepant 100 MG Tabs Take 0.5-1 tablets by mouth every 2 (two) hours as needed (headache/migraine). Maximum of 2 tablets in 24 hours   venlafaxine XR 150 MG 24 hr capsule Commonly known as: EFFEXOR-XR Take 1 capsule (150 mg total) by mouth every morning.       Allergies  Allergen Reactions   Droperidol Palpitations and Other (See Comments)    Elevates BP increases HR   Ondansetron Hives, Swelling and Rash    Spots around IV site IV Zofran   Benzonatate Rash and Other (See Comments)    Caused burning pain in eyes  Watery eyes  Vision changes  Conjunctivitis   Bromfed Dm [Pseudoeph-Bromphen-Dm] Anxiety   Diclofenac Sodium Rash   Flexeril [Cyclobenzaprine] Anxiety    Morphine And Codeine Itching    Pt reports she has had Morphine since the reaction and she didn't have a problem with it.       The results of significant diagnostics from Sabrina [name] hospitalization (including imaging, microbiology, ancillary and laboratory) are listed below for reference.    Significant Diagnostic Studies: CT ABDOMEN PELVIS W CONTRAST Result Date: 10/26/2023 CLINICAL DATA:  Epigastric pain.  Hematemesis EXAM: CT ABDOMEN AND PELVIS WITH CONTRAST TECHNIQUE: Multidetector CT imaging of the abdomen and pelvis was performed using the standard protocol following bolus administration of intravenous contrast. RADIATION DOSE REDUCTION: Sabrina [name] exam was performed according to the departmental dose-optimization program which includes automated exposure control, adjustment of the mA and/or kV according to patient size and/or use of iterative reconstruction technique. CONTRAST:  OMNIPAQUE IOHEXOL 300 MG/ML  SOLN COMPARISON:  CT scan abdomen and pelvis from 11/03/2013. FINDINGS: Lower chest: There are subpleural atelectatic changes in the visualized lung bases. No overt consolidation. No pleural effusion. The heart is normal in size. No pericardial effusion. Hepatobiliary: The liver is normal in size. Non-cirrhotic configuration. No suspicious mass. There are multiple scattered simple cysts throughout the liver with largest in the left hepatic lobe, segment 3 measuring up to 2.9 x 3.3 cm. No intrahepatic or extrahepatic bile duct dilation. Gallbladder is surgically absent. Pancreas: Unremarkable. No pancreatic ductal dilatation or surrounding inflammatory changes. Spleen: Within normal limits. No focal lesion. Adrenals/Urinary Tract: Adrenal glands are unremarkable. No suspicious renal mass. No hydronephrosis. No renal or ureteric calculi. Unremarkable urinary bladder. Stomach/Bowel: No disproportionate dilation of the small or large bowel loops. No evidence of abnormal bowel wall thickening or  inflammatory changes. The appendix is surgically absent. Vascular/Lymphatic: No ascites or pneumoperitoneum. No abdominal or pelvic lymphadenopathy, by size criteria. No aneurysmal dilation of the major abdominal arteries. Reproductive: The uterus is surgically absent. No large adnexal mass. Other: There is a tiny fat containing umbilical hernia. The soft tissues and abdominal wall are otherwise unremarkable. Musculoskeletal: No suspicious osseous lesions. There are mild multilevel degenerative changes in the visualized spine. IMPRESSION: *No acute inflammatory process identified the abdomen or pelvis. *Multiple other nonacute observations (such as multiple hepatic cysts, surgically absent appendix, etc.), As described above. Electronically Signed   By: Jules Schick M.D.   On: 10/26/2023 13:12    Microbiology: No results found for Sabrina [name] or any previous visit (from the past 240 hours).   Labs: Basic Metabolic Panel: Recent Labs  Lab 10/26/23 0827 10/27/23 0454  NA 139 138  K 3.9 3.6  CL 111 111  CO2 21* 20*  GLUCOSE 121* 90  BUN 37* 25*  CREATININE 0.81 0.70  CALCIUM 9.1 8.4*   Liver Function Tests: Recent Labs  Lab 10/26/23 0827 10/27/23 0612  AST 31 27  ALT 25 24  ALKPHOS 121 105  BILITOT 0.3 0.3  PROT 7.1 6.0*  ALBUMIN 3.8 3.4*   Recent Labs  Lab 10/26/23 0827  LIPASE 50   No results for input(s): "AMMONIA" in the last 168 hours. CBC: Recent Labs  Lab 10/26/23 0827 10/27/23 0612  WBC 4.2 3.7*  NEUTROABS 2.5  --   HGB 11.4* 10.2*  HCT 35.2* 31.6*  MCV 94.1 93.5  PLT 219 202   Cardiac Enzymes: No results for input(s): "CKTOTAL", "CKMB", "CKMBINDEX", "TROPONINI" in the last 168 hours. BNP: BNP (last 3 results) No results for input(s): "BNP" in the last 8760 hours.  ProBNP (last 3 results) No results for input(s): "PROBNP" in the last 8760 hours.  CBG: No results for input(s): "GLUCAP" in the last 168 hours.  Signed:  Rhetta Mura MD   [facility] 10/27/2023, 8:46 AM

## 2023-10-27 NOTE — Progress Notes (Signed)
Dr. [name] will follow up path from EGD this admission.  Recommendations outlined in procedure note.   Patient should follow up with her primary GI  [facility], [place], [place] upon discharge.  Relayed this information via secure chat to patient's hospitalist [name].  Brooke Bonito, MSN, APRN, FNP-BC, AGACNP-BC [facility] at [facility]

## 2023-10-27 NOTE — Progress Notes (Signed)
Transition of Care Department [facility]) has reviewed patient and no other TOC needs have been identified at this time. We will continue to monitor patient advancement through interdisciplinary progression rounds. If new patient transition needs arise, please place a TOC consult.   10/27/23 0811  TOC Brief Assessment  Insurance and Status Reviewed  Patient has primary care physician Yes  Home environment has been reviewed Lives alone.  Prior level of function: Independent.  Prior/Current Home Services No current home services  Social Drivers of Health Review SDOH reviewed no interventions necessary  Readmission risk has been reviewed Yes  Transition of care needs no transition of care needs at this time

## 2023-10-27 NOTE — Plan of Care (Signed)
Problem: Education: Goal: Knowledge of General Education information will improve Description Including pain rating scale, medication(s)/side effects and non-pharmacologic comfort measures Outcome: Progressing   Problem: Clinical Measurements: Goal: Ability to maintain clinical measurements within normal limits will improve Outcome: Progressing   Problem: Activity: Goal: Risk for activity intolerance will decrease Outcome: Progressing

## 2023-10-28 DIAGNOSIS — K920 Hematemesis: Secondary | ICD-10-CM | POA: Diagnosis not present

## 2023-10-28 DIAGNOSIS — K2990 Gastroduodenitis, unspecified, without bleeding: Secondary | ICD-10-CM

## 2023-10-28 DIAGNOSIS — K2970 Gastritis, unspecified, without bleeding: Secondary | ICD-10-CM

## 2023-10-28 LAB — CBC WITH DIFFERENTIAL/PLATELET
Abs Immature Granulocytes: 0 10*3/uL (ref 0.00–0.07)
Basophils Absolute: 0 10*3/uL (ref 0.0–0.1)
Basophils Relative: 0 %
Eosinophils Absolute: 0.2 10*3/uL (ref 0.0–0.5)
Eosinophils Relative: 4 %
HCT: 34.3 % — ABNORMAL LOW (ref 36.0–46.0)
Hemoglobin: 10.9 g/dL — ABNORMAL LOW (ref 12.0–15.0)
Immature Granulocytes: 0 %
Lymphocytes Relative: 44 %
Lymphs Abs: 1.5 10*3/uL (ref 0.7–4.0)
MCH: 30.1 pg (ref 26.0–34.0)
MCHC: 31.8 g/dL (ref 30.0–36.0)
MCV: 94.8 fL (ref 80.0–100.0)
Monocytes Absolute: 0.5 10*3/uL (ref 0.1–1.0)
Monocytes Relative: 15 %
Neutro Abs: 1.3 10*3/uL — ABNORMAL LOW (ref 1.7–7.7)
Neutrophils Relative %: 37 %
Platelets: 211 10*3/uL (ref 150–400)
RBC: 3.62 MIL/uL — ABNORMAL LOW (ref 3.87–5.11)
RDW: 16.2 % — ABNORMAL HIGH (ref 11.5–15.5)
WBC: 3.4 10*3/uL — ABNORMAL LOW (ref 4.0–10.5)
nRBC: 0 % (ref 0.0–0.2)

## 2023-10-28 LAB — BASIC METABOLIC PANEL
Anion gap: 8 (ref 5–15)
BUN: 18 mg/dL (ref 6–20)
CO2: 25 mmol/L (ref 22–32)
Calcium: 8.7 mg/dL — ABNORMAL LOW (ref 8.9–10.3)
Chloride: 107 mmol/L (ref 98–111)
Creatinine, Ser: 0.86 mg/dL (ref 0.44–1.00)
GFR, Estimated: >60 mL/min (ref >60)
Glucose, Bld: 87 mg/dL (ref 70–99)
Potassium: 3.8 mmol/L (ref 3.5–5.1)
Sodium: 140 mmol/L (ref 135–145)

## 2023-10-28 MED ORDER — OXYCODONE HCL 5 MG PO TABS: 5.0000 mg | ORAL_TABLET | Freq: Three times a day (TID) | ORAL | 0 refills | Status: DC | PRN

## 2023-10-28 MED ORDER — OXYCODONE HCL 5 MG PO TABS: 5.0000 mg | ORAL_TABLET | Freq: Four times a day (QID) | ORAL | Status: DC | PRN

## 2023-10-28 MED ORDER — PANTOPRAZOLE SODIUM 40 MG PO TBEC: 40.0000 mg | DELAYED_RELEASE_TABLET | Freq: Every day | ORAL | Status: DC

## 2023-10-28 NOTE — Progress Notes (Signed)
 Patient with improvement in her abdominal pain, no nausea or vomiting. This am no further hematochezia.  Her follow up hgb is stable.   BP 103/67 (BP Location: Left Arm)   Pulse 71   Temp 97.8 F (36.6 C) (Oral)   Resp 18   Ht 5\' 4"  (1.626 m)   Wt 78.9 kg   SpO2 99%   BMI 29.87 kg/m   Neurology awake and alert ENT with no pallor or icterus Cardiovascular with S1 and S2 present and regular with no gallops, rubs or murmurs Respiratory with no wheezing, rales or rhonchi Abdomen with no distention, non tender.  No lower extremity edema.   Plan to advance diet to soft, if no further abdominal pain, patient will be discharged home to continue pantoprazole as outpatient.

## 2023-10-28 NOTE — Progress Notes (Signed)
 Patient stated that she noticed blood clots in the toilet after she had a small bowel movement and urinated. She also states that there was no blood on the toilet paper when she wiped. Pt flushed toilet so I could not visualize. Instructed pt not to flush if it happened again.   Patient also endorsing "worse" abdominal pain tonight than "any other night". Reassurance given that gastritis/peptic ulcers take weeks to heal and that pain was to be expected. Medications provided.

## 2023-10-30 LAB — SURGICAL PATHOLOGY

## 2023-10-31 NOTE — Progress Notes (Signed)
 I reviewed the pathology results. Sabrina [name], can you send her a letter with the findings as described below please?   Thanks,  Sabrina Lawman, MD Gastroenterology and Hepatology [facility]  ---------------------------------------------------------------------------------------------  [facility] [address], [address], [place], [place] [address] Phone:  [phone]   10/31/23 Sabrina [name], [place]   Dear Sabrina [name],  I am writing to inform you that the biopsies taken during your recent endoscopic examination showed: FINAL MICROSCOPIC DIAGNOSIS:   A. SMALL BOWEL, BIOPSY:  Duodenal mucosa with preserved villoglandular architecture without  increased intraepithelial lymphocytes or evidence of active  inflammation.  No evidence of gluten sensitive enteropathy.   B. GASTRIC, BIOPSY:  Gastric antral mucosa with reactive epithelial changes.  Gastric oxyntic mucosa without significant diagnostic alteration.  No H. pylori identified on HE stain.  Negative for intestinal metaplasia or dysplasia.    What does this mean?  No H. Pylori bacteria in stomach , or any early cancer changes to the stomach mucosa ( Intestinal metaplasia)   Normal biopsies of the small bowel ( no evidence of celiac disease) . This is all good news  Also I value your feedback , so if you get a survey , please take the time to fill it out and thank you for choosing [facility]  Sincerely,  Sabrina Lawman, MD Gastroenterology and Hepatology

## 2023-11-01 ENCOUNTER — Ambulatory Visit (HOSPITAL_COMMUNITY): Payer: Medicaid Other | Admitting: Psychiatry

## 2023-11-05 NOTE — Anesthesia Postprocedure Evaluation (Signed)
 Anesthesia Post Note  Patient: Sabrina [name]  Procedure(s) Performed: ESOPHAGOGASTRODUODENOSCOPY (EGD) WITH PROPOFOL BIOPSY  Anesthesia Type: General Anesthetic complications: no   No notable events documented.   Last Vitals:  Vitals:   10/27/23 2138 10/28/23 0444  BP: 102/63 103/67  Pulse: 86 71  Resp:    Temp: 36.6 C 36.6 C  SpO2: 93% 99%    Last Pain:  Vitals:   10/28/23 1206  TempSrc:   PainSc: 8                  Roslynn Amble

## 2023-11-08 ENCOUNTER — Encounter (INDEPENDENT_AMBULATORY_CARE_PROVIDER_SITE_OTHER): Payer: Self-pay | Admitting: *Deleted

## 2023-11-08 ENCOUNTER — Ambulatory Visit (HOSPITAL_BASED_OUTPATIENT_CLINIC_OR_DEPARTMENT_OTHER): Payer: BLUE CROSS/BLUE SHIELD | Admitting: Psychiatry

## 2023-11-08 ENCOUNTER — Ambulatory Visit (HOSPITAL_COMMUNITY): Payer: Medicaid Other | Admitting: Psychiatry

## 2023-11-08 ENCOUNTER — Other Ambulatory Visit: Payer: Self-pay

## 2023-11-08 ENCOUNTER — Encounter (HOSPITAL_COMMUNITY): Payer: Self-pay | Admitting: Psychiatry

## 2023-11-08 ENCOUNTER — Other Ambulatory Visit (HOSPITAL_COMMUNITY): Payer: Self-pay | Admitting: Psychiatry

## 2023-11-08 VITALS — BP 115/79 | HR 109 | Ht 64.0 in | Wt 177.0 lb

## 2023-11-08 DIAGNOSIS — F329 Major depressive disorder, single episode, unspecified: Secondary | ICD-10-CM | POA: Diagnosis not present

## 2023-11-08 MED ORDER — DIAZEPAM 10 MG PO TABS: 10.0000 mg | ORAL_TABLET | Freq: Every evening | ORAL | 3 refills | Status: DC | PRN

## 2023-11-08 MED ORDER — VENLAFAXINE HCL ER 75 MG PO CP24: 75.0000 mg | ORAL_CAPSULE | Freq: Every day | ORAL | 3 refills | Status: DC

## 2023-11-08 NOTE — Progress Notes (Signed)
 This is used to review the EEG "[facility] MD/PA/NP OP Progress Note  11/08/2023 4:24 PM Sabrina [name]  MRN:  [id number]  Chief Complaint:      Today the patient is seen in the office.  After her last visit about 6 or 7 weeks ago where she seemed to be mildly dysphoric for a week or so she felt better.  But in the last month she continues to feel not herself.  It is noted that she has had some serious medical problems.  They have identified thyroid masses that have grown quickly and that she is probably going to have surgery soon.  I suspect that she is concerned about the potential of being a malignancy.  She also shares with me that her liver has a number of cysts on it which is new.  Her liver enzymes are increased.  This is somewhat concerning and I suspect it produces anxiety in this patient.  She admits that she is overwhelmed with everything.  Presently she takes 150 mg of Effexor and Valium 10 mg twice daily.  She continues to work without a problem.  She likes work.  She also likes her animals.  She still rides her horses.  While she has friends around her she just does not feel motivated to socialize.  She denies feeling worthless and she certainly does not acknowledge suicidal thinking. Virtual Visit via Telephone Note  I connected with Sabrina [name] on 11/08/23 at  4:15 PM EST by telephone and verified that I am speaking with the correct person using two identifiers.  Location: Patient: home Provider: office   I discussed the limitations, risks, security and privacy concerns of performing an evaluation and management service by telephone and the availability of in person appointments. I also discussed with the patient that there may be a patient responsible charge related to this service. The patient expressed understanding and agreed to proceed.     I discussed the assessment and treatment plan with the patient. The patient was provided an opportunity to ask questions and all were answered.  The patient agreed with the plan and demonstrated an understanding of the instructions.   The patient was advised to call back or seek an in-person evaluation if the symptoms worsen or if the condition fails to improve as anticipated.  I provided 30 minutes of non-face-to-face time during this encounter.   [name], MD   Virtual Visit via Telephone Note  I connected with Sabrina [name] on 11/08/23 at  4:15 PM EST by telephone and verified that I am speaking with the correct person using two identifiers.  Location: Patient: home Provider: office   I discussed the limitations, risks, security and privacy concerns of performing an evaluation and management service by telephone and the availability of in person appointments. I also discussed with the patient that there may be a patient responsible charge related to this service. The patient expressed understanding and agreed to proceed.     I discussed the assessment and treatment plan with the patient. The patient was provided an opportunity to ask questions and all were answered. The patient agreed with the plan and demonstrated an understanding of the instructions.   The patient was advised to call back or seek an in-person evaluation if the symptoms worsen or if the condition fails to improve as anticipated.  I provided 30 minutes of non-face-to-face time during this encounter.   [name], MD  Past Medical History:  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HCC)    Poisoning, snake bite 04/08/2016   "[place]; RUE"   PONV (postoperative nausea and vomiting)    S/P right knee surgery    Situational anxiety    Staph  infection ~ 2015   "left elbow and finger"   Thyroid mass     Past Surgical History:  Procedure Laterality Date   APPENDECTOMY  ~ 1987   APPLICATION OF A-CELL OF EXTREMITY Left 08/05/2015   Procedure: APPLICATION OF A-CELL OF EXTREMITY;  Surgeon: Peggye Form, DO;  Location: [facility];  Service: Plastics;  Laterality: Left;   BIOPSY  10/26/2023   Procedure: BIOPSY;  Surgeon: [name], MD;  [facility];  Service: Endoscopy;;   BREAST SURGERY Right 1990   "milk duct taken out"   CHONDROPLASTY Right 02/19/2019   Procedure: CHONDROPLASTY; EXCISION EXOSTOSIS;  Surgeon: Valeria Batman, MD;  Location: [facility];  Service: Orthopedics;  Laterality: Right;   DEBRIDEMENT AND CLOSURE WOUND Left 07/01/2015   Procedure: LEFT ELBOW EXCISION OF WOUND WITH PRIMARY CLOSURE 2X5 CM ;  Surgeon: Peggye Form, DO;  Location: [facility];  Service: Plastics;  Laterality: Left;   ELBOW SURGERY Left X 23 in [place] <06/2015   from a cat bite; all I&D   ESOPHAGOGASTRODUODENOSCOPY (EGD) WITH PROPOFOL N/A 10/26/2023   Procedure: ESOPHAGOGASTRODUODENOSCOPY (EGD) WITH PROPOFOL;  Surgeon: [name], MD;  [facility];  Service: Endoscopy;  Laterality: N/A;   I & D EXTREMITY Left 07/08/2015   Procedure: IRRIGATION AND DEBRIDEMENT EXTREMITY, DRAINAGE OF LEFT ARM WOUND, A-CELL PLACEMENT, WOUND VAC PLACEMENT;  Surgeon: Alena Bills Dillingham, DO;  Location: [facility];  Service: Plastics;  Laterality: Left;   INCISION AND DRAINAGE OF WOUND Left 08/05/2015   Procedure: IRRIGATION AND DEBRIDEMENT LEFT ELBOW WOUND, PLACEMENT OF ACELL;  Surgeon: Peggye Form, DO;  Location: [facility];  Service: Plastics;  Laterality: Left;   KNEE ARTHROSCOPY WITH MEDIAL MENISECTOMY Right 02/19/2019   Procedure: RIGHT KNEE ARTHROSCOPY, DEBRIDEMENT, PARTIAL MEDIAL AND LATERAL MENISECTOMY;  Surgeon: Valeria Batman, MD;  Location:  [facility];  Service: Orthopedics;  Laterality: Right;   LAPAROSCOPIC CHOLECYSTECTOMY  1998   SKIN GRAFT Left 2016   took from anterior thigh; placed at elbow   TONSILLECTOMY  ~ 2000   TOTAL ABDOMINAL HYSTERECTOMY  2003   WRIST SURGERY Right 01/2016    Family Psychiatric History: See intake H&P for full details. Reviewed, with no updates at this time.   Family History:  Family History  Problem Relation Age of Onset   Liver disease Mother    Dementia Mother    Cirrhosis Mother    Prostate cancer Father    Colon cancer Maternal Grandmother    Ovarian cancer Maternal Aunt     Social History:  Social History   Socioeconomic History   Marital status: Divorced    Spouse name: Not on file   Number of children: 1   Years of education: Not on file   Highest education level: Associate degree: academic  program  Occupational History   Occupation: unemployed  Tobacco Use   Smoking status: Never   Smokeless tobacco: Never  Vaping Use   Vaping status: Never Used  Substance and Sexual Activity   Alcohol use: Not Currently    Alcohol/week: 0.0 standard drinks of alcohol   Drug use: No   Sexual activity: Not on file  Other Topics Concern   Not on file  Social History Narrative   Pateint is right-handed. She lives alone in a single level home. She rarely drinks caffeine. She is limited to exercise due to knee injuries.   Social Drivers of Health   Financial Resource Strain: High Risk (06/05/2022)   [facility] to [facility]12[facility] visits prior to 11/12/2022.   Overall Financial Resource Strain (CARDIA)    Difficulty of Paying Living Expenses: Hard  Food Insecurity: No Food Insecurity (10/26/2023)   Hunger Vital Sign    Worried About Running Out of Food in the Last Year: Never true    Ran Out of Food in the Last Year: Never true  Transportation Needs: No  Transportation Needs (10/26/2023)   PRAPARE - Administrator, Civil Service (Medical): No    Lack of Transportation (Non-Medical): No  Physical Activity: Inactive (06/05/2022)   [facility] to 11/12/[facility]22[facility] visits prior to 11/12/2022.   Exercise Vital Sign    Days of Exercise per Week: 0 days    Minutes of Exercise per Session: 10 min  Stress: Stress Concern Present (06/05/2022)   [facility] to [facility]/12/[facility]22[facility] visits prior to 11/12/2022.   [employer] of Occupational Health - Occupational Stress Questionnaire    Feeling of Stress : Rather much  Social Connections: Moderately Isolated (06/05/2022)   [facility] from [facility] visits prior to 11/12/2022., [facility] visits prior to 11/12/2022.   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]    Frequency of Communication with Friends and Family: More than three times a week    Frequency of Social Gatherings with Friends and Family: Twice a week    Attends Religious Services: More than 4 times per year    Active Member of [employer] or Organizations: No    Attends Banker Meetings: Patient declined    Marital Status: Divorced    Allergies:  Allergies  Allergen Reactions   Droperidol Palpitations and Other (See Comments)    Elevates BP increases HR   Ondansetron Hives, Swelling and Rash    Spots around IV site IV Zofran   Benzonatate Rash and Other (See Comments)    Caused burning pain in eyes  Watery eyes  Vision changes  Conjunctivitis   Bromfed Dm [Pseudoeph-Bromphen-Dm] Anxiety   Diclofenac Sodium Rash   Flexeril [Cyclobenzaprine] Anxiety   Morphine And Codeine Itching    Pt reports she has had Morphine since the reaction and  she didn't have a problem with it.     Metabolic Disorder Labs: Lab Results  Component Value Date   HGBA1C 5.4 03/20/2020   MPG 108 03/20/2020   MPG  07/05/2015    QUESTIONABLE IDENTIFICATION / INCORRECTLY LABELED SPECIMEN   No results found for: "PROLACTIN" Lab Results  Component Value Date  CHOL 267 (H) 03/20/2020   TRIG 76 03/20/2020   HDL 67 03/20/2020   CHOLHDL 4.0 03/20/2020   VLDL 29 04/27/2015   LDLCALC 182 (H) 03/20/2020   LDLCALC 122 (H) 06/18/2018   Lab Results  Component Value Date   TSH 1.52 03/20/2020   TSH 2.890 06/18/2018    Therapeutic Level Labs: No results found for: "LITHIUM" No results found for: "VALPROATE" No results found for: "CBMZ"  Current Medications: Current Outpatient Medications  Medication Sig Dispense Refill   diazepam (VALIUM) 10 MG tablet 1 qam  2 qhs (Patient taking differently: Take 10 mg by mouth at bedtime as needed for anxiety or sleep.) 90 tablet 3   FERREX 150 150 MG capsule Take 150 mg by mouth daily. (Patient not taking: Reported on 10/26/2023)     gabapentin (NEURONTIN) 300 MG capsule Take 2 capsules (600 mg total) by mouth 3 (three) times daily. 540 capsule 1   oxyCODONE (OXY IR/ROXICODONE) 5 MG immediate release tablet Take 1 tablet (5 mg total) by mouth every 8 (eight) hours as needed for severe pain (pain score 7-10). 2 tablet 0   pantoprazole (PROTONIX) 40 MG tablet Take 1 tablet (40 mg total) by mouth every morning. 60 tablet 2   promethazine (PHENERGAN) 12.5 MG tablet Take 12.5 mg by mouth every 6 (six) hours as needed for nausea or vomiting.     rosuvastatin (CRESTOR) 20 MG tablet Take 1 tablet by mouth daily.     tiZANidine (ZANAFLEX) 4 MG tablet Take 4 mg by mouth at bedtime as needed for muscle spasms.     Ubrogepant 100 MG TABS Take 0.5-1 tablets by mouth every 2 (two) hours as needed (headache/migraine). Maximum of 2 tablets in 24 hours     venlafaxine XR (EFFEXOR-XR) 150 MG 24 hr capsule Take 1 capsule (150 mg  total) by mouth every morning. 90 capsule 1   No current facility-administered medications for this visit.     Musculoskeletal: Strength & Muscle Tone: within normal limits Gait & Station: normal Patient leans: N/A  Psychiatric Specialty Exam: ROS  Blood pressure 115/79, pulse (!) 109, height 5\' 4"  (1.626 m), weight 177 lb (80.3 kg).Body mass index is 30.38 kg/m.  General Appearance: Casual and Well Groomed  Eye Contact:  Good  Speech:  Clear and Coherent  Volume:  Normal  Mood:  Euthymic  Affect:  Congruent  Thought Process:  Goal Directed and Descriptions of Associations: Intact  Orientation:  Full (Time, Place, and Person)  Thought Content: Logical   Suicidal Thoughts:  No  Homicidal Thoughts:  No  Memory:  Immediate;   Fair  Judgement:  Fair  Insight:  Fair  Psychomotor Activity:  Normal  Concentration:  Concentration: Good  Recall:  Good  Fund of Knowledge: Good  Language: Good  Akathisia:  Negative  Handed:  Right  AIMS (if indicated): not done  Assets:  Communication Skills Desire for Improvement Housing Transportation  ADL's:  Intact  Cognition: WNL  Sleep:  Fair   Screenings: PHQ2-9    Flowsheet Row Office Visit from 01/24/2020 in [facility] [place] Video Visit from 12/25/2019 in [facility] [place] Office Visit from 09/21/2018 in [facility] Office Visit from 06/25/2018 in [facility] Office Visit from 06/18/2018 in [facility]  [facility]  PHQ-2 Total Score 2 0 1 0 0  PHQ-9 Total Score 5 2 -- -- --      Flowsheet Row ED to Hosp-Admission (Discharged) from 10/26/2023 in [place] [facility] ED from 09/30/2023 in [facility] ED from 08/14/2023 in [facility] Emergency Department at [facility]  C-SSRS RISK  CATEGORY No Risk No Risk No Risk        Assessment and Plan:    This patient's diagnosis is major depression.  Presently she takes 150 mg of Effexor.  On her next visit we will consider changing her Effexor as it does get through her liver.  I am unclear exactly what her liver enzymes are and she has never really had any liver disease before.  I think her medical status has got any clarified.  When she returns to see me in 6 to 7 weeks they will be clarity about her thyroid status.  She says all her thyroid blood work came back completely normal.  Her diagnosis is major depression and she takes Effexor that she has been on for years.  She will continue taking Valium 10 mg twice daily for her adjustment disorder with an anxious mood state.  Status of current problems: gradually improving  Labs Ordered: No orders of the defined types were placed in this encounter.   Labs Reviewed: n/a  Collateral Obtained/Records Reviewed: n/a  Plan:  Continue Lexapro 20 mg daily Increase Seroquel to 200 mg nightly Return to clinic in 3-4 months, transfer care to Dr. Liz Beach, MD 11/08/2023, 4:24 PM

## 2023-11-14 ENCOUNTER — Telehealth (INDEPENDENT_AMBULATORY_CARE_PROVIDER_SITE_OTHER): Payer: Self-pay | Admitting: *Deleted

## 2023-11-14 NOTE — Telephone Encounter (Signed)
 Patient advised to go to ED if she has any vomiting with blood and to take a picture of it to show to Ed doctor. She verbalized understanding.

## 2023-11-14 NOTE — Telephone Encounter (Signed)
 Hi Toniann Fail can you please tell patient to seek medical attention by going to the ED if she continues to vomit blood and also take picture of her vomitus to evaluate what it looks like

## 2023-11-14 NOTE — Telephone Encounter (Signed)
 Message left on vm 11/13/23 at 4:43 pm. Patient left voicemail that she was vomiting blood. I called patient this morning and she said she vomited blood twice at work yesterday and 2 -3 times during the night. Blood was bright red. None this morning. Having some abdominal pain in upper mid abdomen. Pain is constant and sharp. The same as when she was admitted to hospital. Had EGD on 10/26/23. Having some nausea. Out of phenergan. Has not been eating much for past couple of days. No fever.   [phone] can leave voicemail. She is going to work.

## 2023-11-16 ENCOUNTER — Telehealth (INDEPENDENT_AMBULATORY_CARE_PROVIDER_SITE_OTHER): Payer: Self-pay | Admitting: Gastroenterology

## 2023-11-16 NOTE — Telephone Encounter (Signed)
 Pt called wanting to make OV with Dr [name]. I offered her OV for tomorrow. Then she said she would just go on to the ER. She was vomiting blood with clots and feeling weak. I told her that was probably best and I would let the nurse and Dr [name] be aware. [phone]

## 2023-11-16 NOTE — Telephone Encounter (Signed)
 FYI - Called patient and she said she waiting for her ride to come and she is going to [facility] ED.

## 2023-11-17 ENCOUNTER — Other Ambulatory Visit (HOSPITAL_COMMUNITY): Payer: Self-pay | Admitting: Otolaryngology

## 2023-11-17 DIAGNOSIS — E041 Nontoxic single thyroid nodule: Secondary | ICD-10-CM

## 2023-11-21 ENCOUNTER — Emergency Department (HOSPITAL_COMMUNITY)
Admission: EM | Admit: 2023-11-21 | Discharge: 2023-11-21 | Discharge status: Against Medical Advice | Attending: Emergency Medicine | Admitting: Emergency Medicine

## 2023-11-21 ENCOUNTER — Encounter (HOSPITAL_COMMUNITY): Payer: Self-pay

## 2023-11-21 ENCOUNTER — Other Ambulatory Visit: Payer: Self-pay

## 2023-11-21 DIAGNOSIS — K921 Melena: Secondary | ICD-10-CM | POA: Diagnosis not present

## 2023-11-21 DIAGNOSIS — K920 Hematemesis: Secondary | ICD-10-CM | POA: Insufficient documentation

## 2023-11-21 DIAGNOSIS — Z5321 Procedure and treatment not carried out due to patient leaving prior to being seen by health care provider: Secondary | ICD-10-CM | POA: Diagnosis not present

## 2023-11-21 NOTE — ED Triage Notes (Signed)
 Pt arrived via POV c/o hematemesis that has returned. Pt reports symptoms began last week, resolved but have now returned. Pt also endorses hematochezia.

## 2023-11-24 ENCOUNTER — Encounter (HOSPITAL_COMMUNITY): Payer: Self-pay

## 2023-11-24 ENCOUNTER — Ambulatory Visit (HOSPITAL_COMMUNITY)
Admission: RE | Admit: 2023-11-24 | Discharge: 2023-11-24 | Disposition: A | Discharge status: Home / Self Care | Source: Ambulatory Visit | Attending: Otolaryngology | Admitting: Otolaryngology

## 2023-11-24 DIAGNOSIS — E041 Nontoxic single thyroid nodule: Secondary | ICD-10-CM | POA: Diagnosis present

## 2023-11-24 MED ORDER — LIDOCAINE HCL (PF) 2 % IJ SOLN
10.0000 mL | Freq: Once | INTRAMUSCULAR | Status: AC
Start: 2023-11-24 — End: 2023-11-24
  Administered 2023-11-24: 10 mL

## 2023-11-24 MED ORDER — LIDOCAINE HCL (PF) 2 % IJ SOLN
INTRAMUSCULAR | Status: AC
  Filled 2023-11-24: qty 10

## 2023-11-24 NOTE — Progress Notes (Signed)
 PT tolerated thyroid biopsy procedure well today. Labs and afirma obtained and sent for pathology. PT ambulatory at discharge with no acute distress noted and verbalized understanding of discharge instructions. Richard from ultrasound took specimens to lab at this time for processing.

## 2023-11-28 LAB — CYTOLOGY - NON PAP

## 2023-12-11 ENCOUNTER — Telehealth (INDEPENDENT_AMBULATORY_CARE_PROVIDER_SITE_OTHER): Payer: Self-pay | Admitting: Gastroenterology

## 2023-12-11 NOTE — Telephone Encounter (Signed)
 Pt had EGD in February 2025

## 2023-12-11 NOTE — Telephone Encounter (Signed)
 Spoke with patient and she states after speaking with me she had some vomiting with blood. I advised her per Dr. [name]  Please advise her to seek medical attention by going to the ED if she continues to vomit blood and also take picture of her vomitus to evaluate what it looks like   Patient verbalized understanding.

## 2023-12-11 NOTE — Telephone Encounter (Signed)
 Patient states she went to ED on 3/11 and left because it was a 5 hour wait. States she has vomiting blood off and on for a few months. When this happens she has a sharp pain right side of back in the middle. States since that day she has had about 4 episodes of vomiting bright red blood and abdominal pain. Last about one day and will have multiple times of vomiting. States she has tried to watch what she eats, taking carafate 2 -3 times per day. 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 Taking otc omeprazole once a day. Taking iron because she says her hemoglobin is low and does get dizzy at times. States last time she had hemoglobin done was at the ED when she left.

## 2023-12-11 NOTE — Telephone Encounter (Signed)
 Pt had EGD by Dr [name] in February 2025. She said she has been vomiting blood and went to the ER and left before being seen due to the wait. Please advise  [phone]

## 2023-12-11 NOTE — Telephone Encounter (Signed)
 I saw this patient as inpatient consult only and have never seen in the clinic  Please advise her to seek medical attention by going to the ED if she continues to vomit blood and also take picture of her vomitus to evaluate what it looks like

## 2023-12-12 ENCOUNTER — Other Ambulatory Visit (HOSPITAL_COMMUNITY): Payer: Self-pay

## 2023-12-12 ENCOUNTER — Other Ambulatory Visit (HOSPITAL_COMMUNITY): Payer: Self-pay | Admitting: Psychiatry

## 2023-12-12 DIAGNOSIS — F329 Major depressive disorder, single episode, unspecified: Secondary | ICD-10-CM

## 2023-12-12 MED ORDER — DIAZEPAM 10 MG PO TABS: ORAL_TABLET | ORAL | 0 refills | Status: DC

## 2023-12-13 ENCOUNTER — Encounter (HOSPITAL_COMMUNITY): Payer: Self-pay | Admitting: *Deleted

## 2023-12-13 ENCOUNTER — Telehealth (INDEPENDENT_AMBULATORY_CARE_PROVIDER_SITE_OTHER): Payer: Self-pay | Admitting: Gastroenterology

## 2023-12-13 ENCOUNTER — Other Ambulatory Visit: Payer: Self-pay

## 2023-12-13 ENCOUNTER — Observation Stay (HOSPITAL_COMMUNITY)
Admission: EM | Admit: 2023-12-13 | Discharge: 2023-12-15 | Disposition: A | Discharge status: Home / Self Care | Attending: Internal Medicine | Admitting: Internal Medicine

## 2023-12-13 DIAGNOSIS — E669 Obesity, unspecified: Secondary | ICD-10-CM | POA: Insufficient documentation

## 2023-12-13 DIAGNOSIS — E042 Nontoxic multinodular goiter: Secondary | ICD-10-CM | POA: Insufficient documentation

## 2023-12-13 DIAGNOSIS — Z6830 Body mass index (BMI) 30.0-30.9, adult: Secondary | ICD-10-CM | POA: Insufficient documentation

## 2023-12-13 DIAGNOSIS — K31811 Angiodysplasia of stomach and duodenum with bleeding: Secondary | ICD-10-CM | POA: Diagnosis not present

## 2023-12-13 DIAGNOSIS — Z79899 Other long term (current) drug therapy: Secondary | ICD-10-CM | POA: Insufficient documentation

## 2023-12-13 DIAGNOSIS — E782 Mixed hyperlipidemia: Secondary | ICD-10-CM | POA: Insufficient documentation

## 2023-12-13 DIAGNOSIS — G894 Chronic pain syndrome: Secondary | ICD-10-CM | POA: Diagnosis not present

## 2023-12-13 DIAGNOSIS — R1110 Vomiting, unspecified: Secondary | ICD-10-CM | POA: Diagnosis not present

## 2023-12-13 DIAGNOSIS — K31819 Angiodysplasia of stomach and duodenum without bleeding: Secondary | ICD-10-CM

## 2023-12-13 DIAGNOSIS — L120 Bullous pemphigoid: Secondary | ICD-10-CM | POA: Diagnosis present

## 2023-12-13 DIAGNOSIS — K2970 Gastritis, unspecified, without bleeding: Secondary | ICD-10-CM | POA: Diagnosis not present

## 2023-12-13 DIAGNOSIS — K2990 Gastroduodenitis, unspecified, without bleeding: Secondary | ICD-10-CM

## 2023-12-13 DIAGNOSIS — F419 Anxiety disorder, unspecified: Secondary | ICD-10-CM | POA: Diagnosis not present

## 2023-12-13 DIAGNOSIS — K920 Hematemesis: Principal | ICD-10-CM | POA: Diagnosis present

## 2023-12-13 DIAGNOSIS — F32A Depression, unspecified: Secondary | ICD-10-CM | POA: Diagnosis not present

## 2023-12-13 DIAGNOSIS — R109 Unspecified abdominal pain: Secondary | ICD-10-CM | POA: Diagnosis present

## 2023-12-13 LAB — COMPREHENSIVE METABOLIC PANEL WITH GFR
ALT: 20 U/L (ref 0–44)
AST: 20 U/L (ref 15–41)
Albumin: 3.8 g/dL (ref 3.5–5.0)
Alkaline Phosphatase: 92 U/L (ref 38–126)
Anion gap: 10 (ref 5–15)
BUN: 29 mg/dL — ABNORMAL HIGH (ref 6–20)
CO2: 23 mmol/L (ref 22–32)
Calcium: 9.5 mg/dL (ref 8.9–10.3)
Chloride: 106 mmol/L (ref 98–111)
Creatinine, Ser: 0.79 mg/dL (ref 0.44–1.00)
GFR, Estimated: >60 mL/min (ref >60)
Glucose, Bld: 105 mg/dL — ABNORMAL HIGH (ref 70–99)
Potassium: 4.4 mmol/L (ref 3.5–5.1)
Sodium: 139 mmol/L (ref 135–145)
Total Bilirubin: 0.4 mg/dL (ref 0.0–1.2)
Total Protein: 7.1 g/dL (ref 6.5–8.1)

## 2023-12-13 LAB — CBC WITH DIFFERENTIAL/PLATELET
Abs Immature Granulocytes: 0.02 10*3/uL (ref 0.00–0.07)
Basophils Absolute: 0 10*3/uL (ref 0.0–0.1)
Basophils Relative: 0 %
Eosinophils Absolute: 0.2 10*3/uL (ref 0.0–0.5)
Eosinophils Relative: 4 %
HCT: 39.4 % (ref 36.0–46.0)
Hemoglobin: 12.6 g/dL (ref 12.0–15.0)
Immature Granulocytes: 0 %
Lymphocytes Relative: 28 %
Lymphs Abs: 1.3 10*3/uL (ref 0.7–4.0)
MCH: 29.9 pg (ref 26.0–34.0)
MCHC: 32 g/dL (ref 30.0–36.0)
MCV: 93.4 fL (ref 80.0–100.0)
Monocytes Absolute: 0.5 10*3/uL (ref 0.1–1.0)
Monocytes Relative: 12 %
Neutro Abs: 2.6 10*3/uL (ref 1.7–7.7)
Neutrophils Relative %: 56 %
Platelets: 306 10*3/uL (ref 150–400)
RBC: 4.22 MIL/uL (ref 3.87–5.11)
RDW: 14.8 % (ref 11.5–15.5)
WBC: 4.6 10*3/uL (ref 4.0–10.5)
nRBC: 0 % (ref 0.0–0.2)

## 2023-12-13 LAB — TYPE AND SCREEN
ABO/RH(D): A POS
Antibody Screen: NEGATIVE

## 2023-12-13 LAB — LIPASE, BLOOD: Lipase: 39 U/L (ref 11–51)

## 2023-12-13 MED ORDER — TIZANIDINE HCL 4 MG PO TABS: 4.0000 mg | ORAL_TABLET | Freq: Every evening | ORAL | Status: DC | PRN

## 2023-12-13 MED ORDER — HYDROMORPHONE HCL 1 MG/ML IJ SOLN
0.5000 mg | Freq: Once | INTRAMUSCULAR | Status: AC
  Administered 2023-12-13: 0.5 mg via INTRAVENOUS
  Filled 2023-12-13: qty 0.5

## 2023-12-13 MED ORDER — PANTOPRAZOLE SODIUM 40 MG IV SOLR
40.0000 mg | Freq: Two times a day (BID) | INTRAVENOUS | Status: DC
  Administered 2023-12-13 – 2023-12-15 (×4): 40 mg via INTRAVENOUS
  Filled 2023-12-13 (×4): qty 10

## 2023-12-13 MED ORDER — SUCRALFATE 1 GM/10ML PO SUSP
1.0000 g | Freq: Three times a day (TID) | ORAL | Status: DC
  Administered 2023-12-13 – 2023-12-15 (×6): 1 g via ORAL
  Filled 2023-12-13 (×6): qty 10

## 2023-12-13 MED ORDER — ROSUVASTATIN CALCIUM 20 MG PO TABS
20.0000 mg | ORAL_TABLET | Freq: Every day | ORAL | Status: DC
  Administered 2023-12-13 – 2023-12-15 (×2): 20 mg via ORAL
  Filled 2023-12-13 (×2): qty 1

## 2023-12-13 MED ORDER — PANTOPRAZOLE SODIUM 40 MG IV SOLR
40.0000 mg | Freq: Once | INTRAVENOUS | Status: AC
  Administered 2023-12-13: 40 mg via INTRAVENOUS
  Filled 2023-12-13: qty 10

## 2023-12-13 MED ORDER — GABAPENTIN 300 MG PO CAPS
600.0000 mg | ORAL_CAPSULE | Freq: Three times a day (TID) | ORAL | Status: DC
  Administered 2023-12-13 – 2023-12-15 (×5): 600 mg via ORAL
  Filled 2023-12-13 (×5): qty 2

## 2023-12-13 MED ORDER — SODIUM CHLORIDE 0.9 % IV BOLUS
1000.0000 mL | Freq: Once | INTRAVENOUS | Status: AC
  Administered 2023-12-13: 1000 mL via INTRAVENOUS

## 2023-12-13 MED ORDER — PROCHLORPERAZINE EDISYLATE 10 MG/2ML IJ SOLN
5.0000 mg | Freq: Once | INTRAMUSCULAR | Status: AC
  Administered 2023-12-13: 5 mg via INTRAVENOUS
  Filled 2023-12-13: qty 2

## 2023-12-13 MED ORDER — ACETAMINOPHEN 650 MG RE SUPP: 650.0000 mg | Freq: Four times a day (QID) | RECTAL | Status: DC | PRN

## 2023-12-13 MED ORDER — VENLAFAXINE HCL ER 75 MG PO CP24
150.0000 mg | ORAL_CAPSULE | Freq: Every morning | ORAL | Status: DC
  Administered 2023-12-13 – 2023-12-15 (×2): 150 mg via ORAL
  Filled 2023-12-13 (×2): qty 2

## 2023-12-13 MED ORDER — PROCHLORPERAZINE EDISYLATE 10 MG/2ML IJ SOLN
10.0000 mg | Freq: Four times a day (QID) | INTRAMUSCULAR | Status: DC | PRN
  Administered 2023-12-13 – 2023-12-14 (×2): 10 mg via INTRAVENOUS
  Filled 2023-12-13 (×2): qty 2

## 2023-12-13 MED ORDER — ORAL CARE MOUTH RINSE: 15.0000 mL | OROMUCOSAL | Status: DC | PRN

## 2023-12-13 MED ORDER — ACETAMINOPHEN 325 MG PO TABS
650.0000 mg | ORAL_TABLET | Freq: Four times a day (QID) | ORAL | Status: DC | PRN
  Administered 2023-12-13 – 2023-12-15 (×3): 650 mg via ORAL
  Filled 2023-12-13 (×4): qty 2

## 2023-12-13 NOTE — ED Provider Notes (Signed)
  EMERGENCY DEPARTMENT AT [facility] Provider Note   CSN: [id number] Arrival date & time: 12/13/23  4098     History  Chief Complaint  Patient presents with   Abdominal Pain    Sabrina [name] is a 58 y.o. female.  Patient has a history of esophagitis and gastritis.  She has been throwing up blood since yesterday.  She threw up to large amounts of blood yesterday and vomited once today with just a small amount.  The history is provided by the patient and medical records. No language interpreter was used.  Abdominal Pain Pain location:  Generalized Pain quality: aching   Pain radiates to:  Does not radiate Pain severity:  Mild Onset quality:  Sudden Timing:  Constant Progression:  Worsening Chronicity:  New Context: not alcohol use   Associated symptoms: no chest pain, no cough, no diarrhea, no fatigue and no hematuria        Home Medications Prior to Admission medications   Medication Sig Start Date End Date Taking? Authorizing Provider  diazepam (VALIUM) 10 MG tablet 1 qam  2 qhs 12/12/23   Plovsky, Earvin Hansen, MD  FERREX 150 150 MG capsule Take 150 mg by mouth daily. Patient not taking: Reported on 10/26/2023    [provider]  gabapentin (NEURONTIN) 300 MG capsule Take 2 capsules (600 mg total) by mouth 3 (three) times daily. 08/10/21   Philip Aspen, Limmie Patricia, MD  oxyCODONE (OXY IR/ROXICODONE) 5 MG immediate release tablet Take 1 tablet (5 mg total) by mouth every 8 (eight) hours as needed for severe pain (pain score 7-10). 10/28/23   Arrien, York Ram, MD  pantoprazole (PROTONIX) 40 MG tablet Take 1 tablet (40 mg total) by mouth every morning. 10/27/23   Rhetta Mura, MD  promethazine (PHENERGAN) 12.5 MG tablet Take 12.5 mg by mouth every 6 (six) hours as needed for nausea or vomiting. 10/20/22   [provider]  rosuvastatin (CRESTOR) 20 MG tablet Take 1 tablet by mouth daily. 06/08/22   [provider]  tiZANidine  (ZANAFLEX) 4 MG tablet Take 4 mg by mouth at bedtime as needed for muscle spasms. 09/25/23   [provider]  Ubrogepant 100 MG TABS Take 0.5-1 tablets by mouth every 2 (two) hours as needed (headache/migraine). Maximum of 2 tablets in 24 hours 05/12/23   [provider]  venlafaxine XR (EFFEXOR XR) 75 MG 24 hr capsule Take 1 capsule (75 mg total) by mouth daily with breakfast. 2 qam 11/08/23   Archer Asa, MD  venlafaxine XR (EFFEXOR-XR) 150 MG 24 hr capsule Take 1 capsule (150 mg total) by mouth every morning. 07/12/23 07/11/24  Plovsky, Earvin Hansen, MD      Allergies    Droperidol, Ondansetron, Benzonatate, Bromfed dm [pseudoeph-bromphen-dm], Diclofenac sodium, Flexeril [cyclobenzaprine], and Morphine and codeine    Review of Systems   Review of Systems  Constitutional:  Negative for appetite change and fatigue.  HENT:  Negative for congestion, ear discharge and sinus pressure.   Eyes:  Negative for discharge.  Respiratory:  Negative for cough.   Cardiovascular:  Negative for chest pain.  Gastrointestinal:  Positive for abdominal pain. Negative for diarrhea.       Vomiting up blood  Genitourinary:  Negative for frequency and hematuria.  Musculoskeletal:  Negative for back pain.  Skin:  Negative for rash.  Neurological:  Negative for seizures and headaches.  Psychiatric/Behavioral:  Negative for hallucinations.     Physical Exam Updated Vital Signs BP Marland Kitchen)  126/93 (BP Location: Left Arm)   Pulse (!) 108   Temp 98.1 F (36.7 C) (Oral)   Resp 18   Ht 5\' 4"  (1.626 m)   Wt 80.3 kg   SpO2 97%   BMI 30.39 kg/m  Physical Exam Vitals and nursing note reviewed.  Constitutional:      Appearance: She is well-developed.  HENT:     Head: Normocephalic.     Nose: Nose normal.  Eyes:     General: No scleral icterus.    Conjunctiva/sclera: Conjunctivae normal.  Neck:     Thyroid: No thyromegaly.  Cardiovascular:     Rate and Rhythm: Normal rate and regular rhythm.      Heart sounds: No murmur heard.    No friction rub. No gallop.  Pulmonary:     Breath sounds: No stridor. No wheezing or rales.  Chest:     Chest wall: No tenderness.  Abdominal:     General: There is no distension.     Tenderness: There is no abdominal tenderness. There is no rebound.  Musculoskeletal:        General: Normal range of motion.     Cervical back: Neck supple.  Lymphadenopathy:     Cervical: No cervical adenopathy.  Skin:    Findings: No erythema or rash.  Neurological:     Mental Status: She is alert and oriented to person, place, and time.     Motor: No abnormal muscle tone.     Coordination: Coordination normal.  Psychiatric:        Behavior: Behavior normal.     ED Results / Procedures / Treatments   Labs (all labs ordered are listed, but only abnormal results are displayed) Labs Reviewed  COMPREHENSIVE METABOLIC PANEL WITH GFR - Abnormal; Notable for the following components:      Result Value   Glucose, Bld 105 (*)    BUN 29 (*)    All other components within normal limits  CBC WITH DIFFERENTIAL/PLATELET  TYPE AND SCREEN    EKG None  Radiology No results found.  Procedures Procedures    Medications Ordered in ED Medications  sodium chloride 0.9 % bolus 1,000 mL (0 mLs Intravenous Stopped 12/13/23 0848)  pantoprazole (PROTONIX) injection 40 mg (40 mg Intravenous Given 12/13/23 0800)  HYDROmorphone (DILAUDID) injection 0.5 mg (0.5 mg Intravenous Given 12/13/23 1024)    ED Course/ Medical Decision Making/ A&P                                 Medical Decision Making Amount and/or Complexity of Data Reviewed Labs: ordered.  Risk Prescription drug management. Decision regarding hospitalization.   Hematemesis with history of gastritis and esophagitis.  Patient is admitted to medicine.  GI will consult        Final Clinical Impression(s) / ED Diagnoses Final diagnoses:  Hematemesis with nausea    Rx / DC Orders ED Discharge Orders      None         Bethann Berkshire, MD 12/17/23 1132

## 2023-12-13 NOTE — Consult Note (Signed)
 Gastroenterology Consult   Referring Provider: No ref. provider found Primary Care Physician:  Randel Pigg, Dorma Russell, MD Primary Gastroenterologist:  Dr.Ahmed   Patient ID: Sabrina [name]; [id number]; Sep 07, 1966   Admit date: 12/13/2023  LOS: 0 days   Date of Consultation: 12/13/2023  Reason for Consultation:  Hematemesis   History of Present Illness   Sabrina [name] is a 58 y.o. year old female with history of arthritis, bullous pemphigus, osteomyelitis, anxiety and hematemesis secondary to esophagitis and gastritis who presented to the ED with c/o hematemesis taht began yesterday.  ED course:  Hgb 12.6, BUN 29  VSS stable  Consult: Patient with recent presentation to the hospital in February for hematemesis where she underwent EGD which showed grade A esophagitis and gastritis with 2cm hiatal hernia, advised to take PPI daily x8 weeks and avoid NSAIDs.   Patient reports she began having some mid abdominal pain and then she began having vomiting which she reports was BR in color, she does have a picture she shows me during our encounter showing bright red blood. She continues to have mid abdominal pain without precipitating or alleviating factors. She states she was mostly in her regular states of health prior to onset of acute symptoms other than waxing and waning appetite and some occasional queasiness. She denies any new meds or NSAIDs. No ETOH. Denies any GERD symptoms. She is still taking protonix 40mg  once daily at home. No constipation or diarrhea, no rectal bleeding or melena.. She is not currently on any ACs.    Last EGD:10/26/23 grade A esophagitis, gastritis, 2cm HH A. SMALL BOWEL, BIOPSY:  Duodenal mucosa with preserved villoglandular architecture without  increased intraepithelial lymphocytes or evidence of active  inflammation.  No evidence of gluten sensitive enteropathy.   B. GASTRIC, BIOPSY:  Gastric antral mucosa with reactive epithelial changes.  Gastric oxyntic  mucosa without significant diagnostic alteration.  No H. pylori identified on HE stain.  Negative for intestinal metaplasia or dysplasia.  Last Colonoscopy       Prior to Admission medications   Medication Sig Start Date End Date Taking? Authorizing Provider  diazepam (VALIUM) 10 MG tablet 1 qam  2 qhs 12/12/23   Plovsky, Earvin Hansen, MD  FERREX 150 150 MG capsule Take 150 mg by mouth daily. Patient not taking: Reported on 10/26/2023    [provider]  gabapentin (NEURONTIN) 300 MG capsule Take 2 capsules (600 mg total) by mouth 3 (three) times daily. 08/10/21   Philip Aspen, Limmie Patricia, MD  oxyCODONE (OXY IR/ROXICODONE) 5 MG immediate release tablet Take 1 tablet (5 mg total) by mouth every 8 (eight) hours as needed for severe pain (pain score 7-10). 10/28/23   Arrien, York Ram, MD  pantoprazole (PROTONIX) 40 MG tablet Take 1 tablet (40 mg total) by mouth every morning. 10/27/23   Rhetta Mura, MD  promethazine (PHENERGAN) 12.5 MG tablet Take 12.5 mg by mouth every 6 (six) hours as needed for nausea or vomiting. 10/20/22   [provider]  rosuvastatin (CRESTOR) 20 MG tablet Take 1 tablet by mouth daily. 06/08/22   [provider]  tiZANidine (ZANAFLEX) 4 MG tablet Take 4 mg by mouth at bedtime as needed for muscle spasms. 09/25/23   [provider]  Ubrogepant 100 MG TABS Take 0.5-1 tablets by mouth every 2 (two) hours as needed (headache/migraine). Maximum of 2 tablets in 24 hours 05/12/23   [provider]  venlafaxine XR (EFFEXOR XR) 75 MG 24 hr capsule Take 1  capsule (75 mg total) by mouth daily with breakfast. 2 qam 11/08/23   Archer Asa, MD  venlafaxine XR (EFFEXOR-XR) 150 MG 24 hr capsule Take 1 capsule (150 mg total) by mouth every morning. 07/12/23 07/11/24  Archer Asa, MD    Current Facility-Administered Medications  Medication Dose Route Frequency Provider Last Rate Last Admin   acetaminophen (TYLENOL) tablet 650 mg  650  mg Oral Q6H PRN Tat, Onalee Hua, MD       Or   acetaminophen (TYLENOL) suppository 650 mg  650 mg Rectal Q6H PRN Tat, Onalee Hua, MD       gabapentin (NEURONTIN) capsule 600 mg  600 mg Oral TID Tat, Onalee Hua, MD       pantoprazole (PROTONIX) injection 40 mg  40 mg Intravenous Pablo Ledger, MD   40 mg at 12/13/23 1327   prochlorperazine (COMPAZINE) injection 10 mg  10 mg Intravenous Q6H PRN Tat, Onalee Hua, MD       rosuvastatin (CRESTOR) tablet 20 mg  20 mg Oral Daily Tat, David, MD   20 mg at 12/13/23 1327   tiZANidine (ZANAFLEX) tablet 4 mg  4 mg Oral QHS PRN Tat, Onalee Hua, MD       venlafaxine XR (EFFEXOR-XR) 24 hr capsule 150 mg  150 mg Oral q morning Tat, David, MD        Allergies as of 12/13/2023 - Review Complete 12/13/2023  Allergen Reaction Noted   Droperidol Palpitations and Other (See Comments) 07/01/2015   Ondansetron Hives, Swelling, and Rash 03/09/2015   Benzonatate Rash and Other (See Comments) 09/21/2015   Bromfed dm [pseudoeph-bromphen-dm] Anxiety 09/17/2021   Diclofenac sodium Rash 08/14/2018   Flexeril [cyclobenzaprine] Anxiety 01/18/2017   Morphine and codeine Itching 11/09/2013    Review of Systems   Gen: Denies any fever, chills, loss of appetite, change in weight or weight loss CV: Denies chest pain, heart palpitations, syncope, edema  Resp: Denies shortness of breath with rest, cough, wheezing, coughing up blood, and pleurisy. GI:  denies melena, hematochezia, nausea, diarrhea, constipation, dysphagia, odyonophagia, early satiety or weight loss. +hematemesis +mid/upper abdominal pain  GU : Denies urinary burning, blood in urine, urinary frequency, and urinary incontinence. MS: Denies joint pain, limitation of movement, swelling, cramps, and atrophy.  Derm: Denies rash, itching, dry skin, hives. Psych: Denies depression, anxiety, memory loss, hallucinations, and confusion. Heme: Denies bruising or bleeding Neuro:  Denies any headaches, dizziness, paresthesias,  shaking  Physical Exam   Vital Signs in last 24 hours: Temp:  [97.8 F (36.6 C)-98.1 F (36.7 C)] 97.8 F (36.6 C) (04/02 1300) Pulse Rate:  [82-108] 82 (04/02 1300) Resp:  [18] 18 (04/02 1300) BP: (105-126)/(66-93) 105/66 (04/02 1300) SpO2:  [92 %-97 %] 92 % (04/02 1300) Weight:  [80.3 kg] 80.3 kg (04/02 0657)   General:   Alert,  Well-developed, well-nourished, pleasant and cooperative in NAD Head:  Normocephalic and atraumatic. Eyes:  Sclera clear, no icterus.   Conjunctiva pink. Ears:  Normal auditory acuity. Mouth:  No deformity or lesions, dentition normal. Neck:  Supple; no masses Lungs:  Clear throughout to auscultation.   No wheezes, crackles, or rhonchi. No acute distress. Heart:  Regular rate and rhythm; no murmurs, clicks, rubs,  or gallops. Abdomen:  Soft, nontender and nondistended. No masses, hepatosplenomegaly or hernias noted. Normal bowel sounds, without guarding, and without rebound.   Msk:  Symmetrical without gross deformities. Normal posture. Extremities:  Without clubbing or edema. Neurologic:  Alert and  oriented x4. Skin:  Intact without significant  lesions or rashes. Psych:  Alert and cooperative. Normal mood and affect.   Labs/Studies   Recent Labs Recent Labs    12/13/23 0733  WBC 4.6  HGB 12.6  HCT 39.4  PLT 306   BMET Recent Labs    12/13/23 0809  NA 139  K 4.4  CL 106  CO2 23  GLUCOSE 105*  BUN 29*  CREATININE 0.79  CALCIUM 9.5   LFT Recent Labs    12/13/23 0809  PROT 7.1  ALBUMIN 3.8  AST 20  ALT 20  ALKPHOS 92  BILITOT 0.4     Assessment   Sabrina [name] is a 58 y.o. year old female with history of hematemesis who underwent EGD in February with findings of grade A esophagitis and gastritis who presented to the ED today with recurrent hematemesis, onset yesterday. Hemoglobin stable, GI consulted for further evaluation   Hematemesis: history of the same with onset over the past few days, associated mid/upper  abdominal pain. Denies any new medications, NSAIDs or ETOH.  History of the same with fairly recent EGD revealing grade A esophagitis and gastritis, treated with once daily PPI x 8 weeks which she remains on at home. Denies any GERD symptoms, rectal bleeding or melena. Unclear etiology at this time, could be mallory weiss tear, she has known esophagitis and gastritis, though in setting of PPI use, would expect these to have healed since February. At this time, recommend proceeding with EGD tomorrow for further evaluation. Indications, risks and benefits of procedure discussed in detail with patient. Patient verbalized understanding and is in agreement to proceed with EGD.  Plan / Recommendations    NPO midnight PPI BID Carafate 1g QID Avoid all NSAIDs Anti emetics PRN per hospitalist  Trend H&H, monitor for overt GI bleeding  7. Plan for EGD tomorrow     12/13/2023, 1:33 PM  Elza Sortor L. Jeanmarie Hubert, MSN, APRN, AGNP-C Adult-Gerontology Nurse Practitioner [facility] Gastroenterology at [facility]

## 2023-12-13 NOTE — H&P (Addendum)
 History and Physical    Patient: Sabrina [name] WGN:[id number] DOB: 08/01/1966 DOA: 12/13/2023 DOS: the patient was seen and examined on 12/13/2023 PCP: Randel Pigg, Dorma Russell, MD  Patient coming from: Home  Chief Complaint:  Chief Complaint  Patient presents with   Abdominal Pain   HPI: Sabrina [name] is a 58 year old female with a history of bullous pemphigoid on prior  methotrexate/steoprids follows with [facility] Dr. Ballen Prude and was taken off methotrexate around end 1.2025 multinodular goiter, depression/anxiety, chronic knee pain status post meniscal repair 2020 presenting with persistent nausea and vomiting with intermittent hematemesis for the past 2 to 3 months.  Notably, the patient was admitted to the hospital from 10/26/2023 to 10/28/2023 for hematemesis.  GI was consulted at that time and performed EGD on 10/26/2023.  It showed duodenal mucosal lymphangiectasia, gastritis, and grade a reflux esophagitis.  There was a 2 cm hiatal hernia.  The patient was discharged home with PPI twice daily with instructions to follow-up in the office. The patient denies any NSAIDs, alcohol, or illicit drugs. She states that she had to large-volume hematemesis on 12/12/2023.  After she took her morning medications of 4-25 she had a small amount of hematemesis.  The patient denies any hematochezia or melena.  She denies any fevers, chills, chest pain, shortness of breath, hemoptysis, dysuria, hematuria.  She states that she has some upper abdominal pain with the hematemesis. She states that she will have episodes of emesis without blood, but will also have episodes of hematemesis without any antecedent bilious vomiting.  She has lost 4 pounds since her last hospital admission. In the ED, the patient was afebrile and hemodynamically stable with oxygen saturation 97% room air.  WBC 4.6, hemoglobin 12.6, platelets 306.  Sodium 139, potassium 4.4, bicarbonate 23, serum creatinine 0.79.  LFTs were unremarkable.  GI  was consulted to assist with management.  Review of Systems: As mentioned in the history of present illness. All other systems reviewed and are negative. 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HCC)    Poisoning, snake bite 04/08/2016   "[place]; RUE"   PONV (postoperative nausea and vomiting)    S/P right knee surgery    Situational anxiety    Staph infection ~ 2015   "left elbow and finger"   Thyroid mass    Past Surgical History:  Procedure Laterality Date   APPENDECTOMY  ~ 1987   APPLICATION OF A-CELL OF EXTREMITY Left 08/05/2015   Procedure: APPLICATION OF A-CELL OF EXTREMITY;  Surgeon: Peggye Form, DO;  Location: [facility];  Service: Plastics;  Laterality: Left;   BIOPSY  10/26/2023   Procedure: BIOPSY;  Surgeon: [name], MD;  [facility];  Service: Endoscopy;;   BREAST SURGERY Right 1990   "milk duct taken out"   CHONDROPLASTY Right 02/19/2019   Procedure: CHONDROPLASTY; EXCISION EXOSTOSIS;  Surgeon: Valeria Batman, MD;  Location: [facility];  Service: Orthopedics;  Laterality: Right;   DEBRIDEMENT AND CLOSURE WOUND Left 07/01/2015   Procedure: LEFT ELBOW EXCISION OF WOUND WITH PRIMARY CLOSURE 2X5 CM ;  Surgeon: Alena Bills Dillingham, DO;  Location: [facility];  Service: Plastics;  Laterality: Left;   ELBOW SURGERY Left X 23 in [place] <06/2015   from a cat bite; all I&D   ESOPHAGOGASTRODUODENOSCOPY (EGD) WITH PROPOFOL N/A 10/26/2023   Procedure: ESOPHAGOGASTRODUODENOSCOPY (EGD) WITH PROPOFOL;  Surgeon: [name], MD;  Location: [facility];  Service: Endoscopy;  Laterality: N/A;   I & D EXTREMITY Left  07/08/2015   Procedure: IRRIGATION AND DEBRIDEMENT EXTREMITY, DRAINAGE OF LEFT ARM WOUND, A-CELL PLACEMENT, WOUND VAC PLACEMENT;  Surgeon: Alena Bills Dillingham, DO;  Location: [facility];  Service: Plastics;  Laterality: Left;   INCISION AND DRAINAGE OF WOUND Left 08/05/2015   Procedure: IRRIGATION AND DEBRIDEMENT LEFT ELBOW WOUND, PLACEMENT OF ACELL;  Surgeon: Peggye Form, DO;  Location: [facility];  Service: Plastics;  Laterality: Left;   KNEE ARTHROSCOPY WITH MEDIAL MENISECTOMY Right 02/19/2019   Procedure: RIGHT KNEE ARTHROSCOPY, DEBRIDEMENT, PARTIAL MEDIAL AND LATERAL MENISECTOMY;  Surgeon: Valeria Batman, MD;  Location: [facility];  Service: Orthopedics;  Laterality: Right;   LAPAROSCOPIC CHOLECYSTECTOMY  1998   SKIN GRAFT Left 2016   took from anterior thigh; placed at elbow   TONSILLECTOMY  ~ 2000   TOTAL ABDOMINAL HYSTERECTOMY  2003   WRIST SURGERY Right 01/2016   Social History:  reports that she has never smoked. She has never used smokeless tobacco. She reports that she does not currently use alcohol. She reports that she does not use drugs.  Allergies  Allergen Reactions   Droperidol Palpitations and Other (See Comments)    Elevates BP increases HR   Ondansetron Hives, Swelling and Rash    Spots around IV site IV Zofran   Benzonatate Rash and Other (See Comments)    Caused burning pain in eyes  Watery eyes  Vision changes  Conjunctivitis   Bromfed Dm [Pseudoeph-Bromphen-Dm] Anxiety   Diclofenac Sodium Rash   Flexeril [Cyclobenzaprine] Anxiety   Morphine And Codeine Itching    Pt reports she has had Morphine since the reaction and she didn't have a problem with it.     Family History  Problem Relation Age of Onset   Liver disease Mother    Dementia Mother    Cirrhosis Mother    Prostate cancer Father    Colon cancer Maternal Grandmother    Ovarian cancer Maternal Aunt     Prior to Admission medications   Medication Sig Start  Date End Date Taking? Authorizing Provider  diazepam (VALIUM) 10 MG tablet 1 qam  2 qhs 12/12/23   Plovsky, Earvin Hansen, MD  FERREX 150 150 MG capsule Take 150 mg by mouth daily. Patient not taking: Reported on 10/26/2023    [provider]  gabapentin (NEURONTIN) 300 MG capsule Take 2 capsules (600 mg total) by mouth 3 (three) times daily. 08/10/21   Philip Aspen, Limmie Patricia, MD  oxyCODONE (OXY IR/ROXICODONE) 5 MG immediate release tablet Take 1 tablet (5 mg total) by mouth every 8 (eight) hours as needed for severe pain (pain score 7-10). 10/28/23   Arrien, York Ram, MD  pantoprazole (PROTONIX) 40 MG tablet Take 1 tablet (40 mg total) by mouth every morning. 10/27/23   Rhetta Mura, MD  promethazine (PHENERGAN) 12.5 MG tablet Take 12.5 mg by mouth every 6 (six) hours as needed for nausea or vomiting. 10/20/22   [provider]  rosuvastatin (CRESTOR) 20 MG tablet Take 1 tablet by mouth daily. 06/08/22   [provider]  tiZANidine (ZANAFLEX) 4 MG tablet  Take 4 mg by mouth at bedtime as needed for muscle spasms. 09/25/23   [provider]  Ubrogepant 100 MG TABS Take 0.5-1 tablets by mouth every 2 (two) hours as needed (headache/migraine). Maximum of 2 tablets in 24 hours 05/12/23   [provider]  venlafaxine XR (EFFEXOR XR) 75 MG 24 hr capsule Take 1 capsule (75 mg total) by mouth daily with breakfast. 2 qam 11/08/23   Archer Asa, MD  venlafaxine XR (EFFEXOR-XR) 150 MG 24 hr capsule Take 1 capsule (150 mg total) by mouth every morning. 07/12/23 07/11/24  Archer Asa, MD    Physical Exam: Vitals:   12/13/23 0657  BP: (!) 126/93  Pulse: (!) 108  Resp: 18  Temp: 98.1 F (36.7 C)  TempSrc: Oral  SpO2: 97%  Weight: 80.3 kg  Height: 5\' 4"  (1.626 m)   GENERAL:  A&O x 3, NAD, well developed, cooperative, follows commands HEENT: [place]/AT, No thrush, No icterus, No oral ulcers Neck:  No neck mass, No meningismus, soft, supple CV: RRR, no  S3, no S4, no rub, no JVD Lungs:  CTA, no wheeze, no rhonchi, good air movement Abd: soft/NT +BS, nondistended Ext: No edema, no lymphangitis, no cyanosis, no rashes Neuro:  CN II-XII intact, strength 4/5 in RUE, RLE, strength 4/5 LUE, LLE; sensation intact bilateral; no dysmetria; babinski equivocal  Data Reviewed: Data reviewed above in history  Assessment and Plan: Intractable nausea and vomiting/hematemesis -GI consulted -Hemoglobin stable since last hospital admission -Start PPI twice daily -Start antiemetics -Patient does take phentermine daily  Depression/anxiety -Continue home dose diazepam and venlafaxine -PDMP reviewed--denies, 10 mg, last refill 12/08/2023  Chronic pain syndrome -Continue gabapentin  Multinodular goiter -11/24/2023 thyroid biopsy--benign follicular nodule  History of bullous pemphigoid -Now off of any immunomodulators and DMA RD's-outpatient follow-up with her dermatologist at [facility] as above for further management   Advance Care Planning: FULL  Consults: GI  Family Communication: none  Severity of Illness: The appropriate patient status for this patient is OBSERVATION. Observation status is judged to be reasonable and necessary in order to provide the required intensity of service to ensure the patient's safety. The patient's presenting symptoms, physical exam findings, and initial radiographic and laboratory data in the context of their medical condition is felt to place them at decreased risk for further clinical deterioration. Furthermore, it is anticipated that the patient will be medically stable for discharge from the hospital within 2 midnights of admission.   Author: Catarina Hartshorn, MD 12/13/2023 11:44 AM  For on call review [email or web address].

## 2023-12-13 NOTE — Telephone Encounter (Signed)
 Urgent GI follow up requested with our team, Per Dr. Marletta Lor.

## 2023-12-13 NOTE — Plan of Care (Signed)
   Problem: Education: Goal: Knowledge of General Education information will improve Description: Including pain rating scale, medication(s)/side effects and non-pharmacologic comfort measures Outcome: Progressing   Problem: Health Behavior/Discharge Planning: Goal: Ability to manage health-related needs will improve Outcome: Progressing   Problem: Activity: Goal: Risk for activity intolerance will decrease Outcome: Progressing

## 2023-12-13 NOTE — Hospital Course (Signed)
 58 year old female with a history of bullous pemphigoid on prior  methotrexate/steoprids follows with [facility] Dr. Reaver Prude and was taken off methotrexate around end 1.2025 multinodular goiter, depression/anxiety, chronic knee pain status post meniscal repair 2020 presenting with persistent nausea and vomiting with intermittent hematemesis for the past 2 to 3 months.  Notably, the patient was admitted to the hospital from 10/26/2023 to 10/28/2023 for hematemesis.  GI was consulted at that time and performed EGD on 10/26/2023.  It showed duodenal mucosal lymphangiectasia, gastritis, and grade a reflux esophagitis.  There was a 2 cm hiatal hernia.  The patient was discharged home with PPI twice daily with instructions to follow-up in the office. The patient denies any NSAIDs, alcohol, or illicit drugs. She states that she had to large-volume hematemesis on 12/12/2023.  After she took her morning medications of 4-25 she had a small amount of hematemesis.  The patient denies any hematochezia or melena.  She denies any fevers, chills, chest pain, shortness of breath, hemoptysis, dysuria, hematuria.  She states that she has some upper abdominal pain with the hematemesis. She states that she will have episodes of emesis without blood, but will also have episodes of hematemesis without any antecedent bilious vomiting.  She has lost 4 pounds since her last hospital admission. In the ED, the patient was afebrile and hemodynamically stable with oxygen saturation 97% room air.  WBC 4.6, hemoglobin 12.6, platelets 306.  Sodium 139, potassium 4.4, bicarbonate 23, serum creatinine 0.79.  LFTs were unremarkable.  GI was consulted to assist with management.

## 2023-12-13 NOTE — ED Triage Notes (Signed)
 Pt c/o vomiting blood that started yesterday; pt denies any diarrhea  Pt states the emesis is bright red with clots

## 2023-12-14 ENCOUNTER — Observation Stay (HOSPITAL_BASED_OUTPATIENT_CLINIC_OR_DEPARTMENT_OTHER): Admitting: Certified Registered"

## 2023-12-14 ENCOUNTER — Encounter (HOSPITAL_COMMUNITY)
Admission: EM | Disposition: A | Discharge status: Home / Self Care | Payer: Self-pay | Source: Home / Self Care | Attending: Emergency Medicine

## 2023-12-14 ENCOUNTER — Encounter (HOSPITAL_COMMUNITY): Payer: Self-pay | Admitting: Internal Medicine

## 2023-12-14 ENCOUNTER — Observation Stay (HOSPITAL_COMMUNITY): Admitting: Certified Registered"

## 2023-12-14 ENCOUNTER — Observation Stay (HOSPITAL_COMMUNITY)

## 2023-12-14 DIAGNOSIS — K2971 Gastritis, unspecified, with bleeding: Secondary | ICD-10-CM | POA: Diagnosis not present

## 2023-12-14 DIAGNOSIS — K31819 Angiodysplasia of stomach and duodenum without bleeding: Secondary | ICD-10-CM

## 2023-12-14 DIAGNOSIS — K2990 Gastroduodenitis, unspecified, without bleeding: Secondary | ICD-10-CM | POA: Diagnosis not present

## 2023-12-14 DIAGNOSIS — K920 Hematemesis: Secondary | ICD-10-CM | POA: Diagnosis not present

## 2023-12-14 DIAGNOSIS — K31811 Angiodysplasia of stomach and duodenum with bleeding: Secondary | ICD-10-CM

## 2023-12-14 DIAGNOSIS — K2970 Gastritis, unspecified, without bleeding: Secondary | ICD-10-CM | POA: Diagnosis not present

## 2023-12-14 DIAGNOSIS — L120 Bullous pemphigoid: Secondary | ICD-10-CM | POA: Diagnosis not present

## 2023-12-14 HISTORY — PX: ESOPHAGOGASTRODUODENOSCOPY: SHX5428

## 2023-12-14 HISTORY — PX: HOT HEMOSTASIS: SHX5433

## 2023-12-14 LAB — CBC
HCT: 38.1 % (ref 36.0–46.0)
Hemoglobin: 12 g/dL (ref 12.0–15.0)
MCH: 29.8 pg (ref 26.0–34.0)
MCHC: 31.5 g/dL (ref 30.0–36.0)
MCV: 94.5 fL (ref 80.0–100.0)
Platelets: 278 10*3/uL (ref 150–400)
RBC: 4.03 MIL/uL (ref 3.87–5.11)
RDW: 15 % (ref 11.5–15.5)
WBC: 4.6 10*3/uL (ref 4.0–10.5)
nRBC: 0 % (ref 0.0–0.2)

## 2023-12-14 LAB — BASIC METABOLIC PANEL WITH GFR
Anion gap: 9 (ref 5–15)
BUN: 28 mg/dL — ABNORMAL HIGH (ref 6–20)
CO2: 25 mmol/L (ref 22–32)
Calcium: 8.8 mg/dL — ABNORMAL LOW (ref 8.9–10.3)
Chloride: 104 mmol/L (ref 98–111)
Creatinine, Ser: 0.78 mg/dL (ref 0.44–1.00)
GFR, Estimated: >60 mL/min (ref >60)
Glucose, Bld: 98 mg/dL (ref 70–99)
Potassium: 3.8 mmol/L (ref 3.5–5.1)
Sodium: 138 mmol/L (ref 135–145)

## 2023-12-14 SURGERY — EGD (ESOPHAGOGASTRODUODENOSCOPY)
Anesthesia: General

## 2023-12-14 MED ORDER — MORPHINE SULFATE (PF) 2 MG/ML IV SOLN
1.0000 mg | Freq: Once | INTRAVENOUS | Status: AC
  Administered 2023-12-14: 1 mg via INTRAVENOUS
  Filled 2023-12-14: qty 1

## 2023-12-14 MED ORDER — PROPOFOL 10 MG/ML IV BOLUS
INTRAVENOUS | Status: AC
  Filled 2023-12-14: qty 20

## 2023-12-14 MED ORDER — PHENYLEPHRINE 80 MCG/ML (10ML) SYRINGE FOR IV PUSH (FOR BLOOD PRESSURE SUPPORT)
PREFILLED_SYRINGE | INTRAVENOUS | Status: AC
  Filled 2023-12-14: qty 10

## 2023-12-14 MED ORDER — LIDOCAINE HCL (PF) 2 % IJ SOLN
INTRAMUSCULAR | Status: DC | PRN
  Administered 2023-12-14: 60 mg via INTRADERMAL

## 2023-12-14 MED ORDER — LACTATED RINGERS IV SOLN: INTRAVENOUS | Status: DC

## 2023-12-14 MED ORDER — PROPOFOL 500 MG/50ML IV EMUL
INTRAVENOUS | Status: DC | PRN
  Administered 2023-12-14: 150 ug/kg/min via INTRAVENOUS

## 2023-12-14 MED ORDER — PROPOFOL 10 MG/ML IV BOLUS
INTRAVENOUS | Status: DC | PRN
  Administered 2023-12-14: 40 mg via INTRAVENOUS
  Administered 2023-12-14: 80 mg via INTRAVENOUS

## 2023-12-14 MED ORDER — SODIUM CHLORIDE 0.9 % IV SOLN: INTRAVENOUS | Status: DC

## 2023-12-14 MED ORDER — PHENYLEPHRINE 80 MCG/ML (10ML) SYRINGE FOR IV PUSH (FOR BLOOD PRESSURE SUPPORT)
PREFILLED_SYRINGE | INTRAVENOUS | Status: DC | PRN
  Administered 2023-12-14: 160 ug via INTRAVENOUS

## 2023-12-14 NOTE — TOC CM/SW Note (Signed)
 Transition of Care [facility]) - Inpatient Brief Assessment   Patient Details  Name: Sabrina [name] MRN: [id number] Date of Birth: 06/07/1966  Transition of Care [facility]) CM/SW Contact:    Isabella Bowens, LCSWA Phone Number: 12/14/2023, 8:08 AM   Clinical Narrative:  Transition of Care Department [facility]) has reviewed patient and no TOC needs have been identified at this time. We will continue to monitor patient advancement through interdisciplinary progression rounds. If new patient transition needs arise, please place a TOC consult.  Transition of Care Asessment: Insurance and Status: Insurance coverage has been reviewed Patient has primary care physician: Yes Home environment has been reviewed: Single Family Home Prior level of function:: Independent Prior/Current Home Services: No current home services Social Drivers of Health Review: SDOH reviewed no interventions necessary Readmission risk has been reviewed: Yes Transition of care needs: no transition of care needs at this time

## 2023-12-14 NOTE — Brief Op Note (Signed)
 Patient underwent EGD  under propofol sedation.  Tolerated the procedure adequately.   FINDINGS:  - Normal esophagus.   - Gastritis. Biopsied.  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 Treated with argon plasma coagulation ( APC) . Many of them were actively oozing and others with stigmata of recent bleed.   - A single non- bleeding angioectasia in the duodenum. Treated with argon plasma coagulation ( APC) .   RECOMMENDATIONS  -Consider obatining TTE ( ECHO) to evaluate for any AS  -RUQ sono , Hep B and Hep C serologies  -Protonix 40mg  daily  -Advance diet as tolerated  -Follow up biopsy results  -Follow up in GI clinic  Vista Lawman, MD Gastroenterology and Hepatology [facility] Gastroenterology

## 2023-12-14 NOTE — Plan of Care (Signed)

## 2023-12-14 NOTE — H&P (Signed)
 Primary Care Physician:  Randel Pigg, Dorma Russell, MD Primary Gastroenterologist:  Dr. [name]  Pre-Procedure History & Physical: HPI:   Sabrina [name] is a 58 y.o. year old female with history of arthritis, bullous pemphigus, osteomyelitis, anxiety and hematemesis secondary to esophagitis and gastritis who presented to the ED with c/o hematemesis .   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HCC)    Poisoning, snake bite 04/08/2016   "[place]; RUE"   PONV (postoperative nausea and vomiting)    S/P right knee surgery    Situational anxiety    Staph infection ~ 2015   "left elbow and finger"   Thyroid mass     Past Surgical History:  Procedure Laterality Date   APPENDECTOMY  ~ 1987   APPLICATION OF A-CELL OF EXTREMITY Left 08/05/2015   Procedure: APPLICATION OF A-CELL OF EXTREMITY;  Surgeon: Peggye Form, DO;  Location: [facility];  Service: Plastics;  Laterality: Left;   BIOPSY  10/26/2023   Procedure: BIOPSY;  Surgeon: [name], MD;  [facility];  Service: Endoscopy;;   BREAST SURGERY Right 1990   "milk duct taken out"   CHONDROPLASTY Right 02/19/2019   Procedure: CHONDROPLASTY; EXCISION EXOSTOSIS;  Surgeon: Valeria Batman, MD;  Location: [facility];  Service: Orthopedics;  Laterality: Right;   DEBRIDEMENT AND CLOSURE WOUND Left 07/01/2015   Procedure: LEFT ELBOW EXCISION OF WOUND WITH PRIMARY CLOSURE 2X5 CM ;  Surgeon: Peggye Form, DO;  Location: [facility];  Service: Plastics;  Laterality: Left;   ELBOW SURGERY Left X 23 in [place] <06/2015   from a cat bite; all I&D   ESOPHAGOGASTRODUODENOSCOPY (EGD) WITH PROPOFOL N/A  10/26/2023   Procedure: ESOPHAGOGASTRODUODENOSCOPY (EGD) WITH PROPOFOL;  Surgeon: [name], MD;  [facility];  Service: Endoscopy;  Laterality: N/A;   I & D EXTREMITY Left 07/08/2015   Procedure: IRRIGATION AND DEBRIDEMENT EXTREMITY, DRAINAGE OF LEFT ARM WOUND, A-CELL PLACEMENT, WOUND VAC PLACEMENT;  Surgeon: Alena Bills Dillingham, DO;  Location: [facility];  Service: Plastics;  Laterality: Left;   INCISION AND DRAINAGE OF WOUND Left 08/05/2015   Procedure: IRRIGATION AND DEBRIDEMENT LEFT ELBOW WOUND, PLACEMENT OF ACELL;  Surgeon: Peggye Form, DO;  Location: [facility];  Service: Plastics;  Laterality: Left;   KNEE ARTHROSCOPY WITH MEDIAL MENISECTOMY Right 02/19/2019   Procedure: RIGHT KNEE ARTHROSCOPY, DEBRIDEMENT, PARTIAL MEDIAL AND LATERAL MENISECTOMY;  Surgeon: Valeria Batman, MD;  Location:  SURGERY CENTER;  Service: Orthopedics;  Laterality: Right;   LAPAROSCOPIC CHOLECYSTECTOMY  1998   SKIN GRAFT Left 2016   took from anterior thigh; placed at elbow   TONSILLECTOMY  ~ 2000   TOTAL ABDOMINAL HYSTERECTOMY  2003   WRIST SURGERY Right 01/2016    Prior to Admission medications   Medication Sig Start Date End Date Taking? Authorizing Provider  amoxicillin-clavulanate (AUGMENTIN) 875-125 MG tablet Take 1 tablet by mouth 2 (two) times daily. For 7 days 12/09/23 12/16/23 Yes [provider]  butalbital-acetaminophen-caffeine (FIORICET) 50-325-40 MG tablet Take 1 tablet by mouth daily as needed. 11/19/23  Yes [provider]  diazepam (VALIUM) 10 MG tablet 1 qam  2 qhs 12/12/23  Yes Plovsky, Earvin Hansen, MD  FERREX 150 150 MG capsule Take 150 mg by mouth daily.   Yes [provider]  gabapentin (NEURONTIN) 300 MG capsule Take 2 capsules (600 mg total) by mouth 3 (three) times daily. 08/10/21  Yes Philip Aspen, Limmie Patricia, MD  pantoprazole (PROTONIX) 40 MG tablet Take 1 tablet (40 mg total) by mouth every morning. 10/27/23  Yes  Rhetta Mura, MD  rosuvastatin (CRESTOR) 20 MG tablet Take 1 tablet by mouth daily. 06/08/22  Yes [provider]  tiZANidine (ZANAFLEX) 4 MG tablet Take 4 mg by mouth at bedtime as needed for muscle spasms. 09/25/23  Yes [provider]  venlafaxine XR (EFFEXOR-XR) 150 MG 24 hr capsule Take 1 capsule (150 mg total) by mouth every morning. 07/12/23 07/11/24 Yes Plovsky, Earvin Hansen, MD  oxyCODONE (OXY IR/ROXICODONE) 5 MG immediate release tablet Take 1 tablet (5 mg total) by mouth every 8 (eight) hours as needed for severe pain (pain score 7-10). Patient not taking: Reported on 12/13/2023 10/28/23   Arrien, York Ram, MD  promethazine (PHENERGAN) 12.5 MG tablet Take 12.5 mg by mouth every 6 (six) hours as needed for nausea or vomiting. Patient not taking: Reported on 12/13/2023 10/20/22   [provider]  Ubrogepant 100 MG TABS Take 0.5-1 tablets by mouth every 2 (two) hours as needed (headache/migraine). Maximum of 2 tablets in 24 hours Patient not taking: Reported on 12/13/2023 05/12/23   [provider]  venlafaxine XR (EFFEXOR XR) 75 MG 24 hr capsule Take 1 capsule (75 mg total) by mouth daily with breakfast. 2 qam Patient not taking: Reported on 12/13/2023 11/08/23   Archer Asa, MD    Allergies as of 12/13/2023 - Review Complete 12/13/2023  Allergen Reaction Noted   Droperidol Palpitations and Other (See Comments) 07/01/2015   Ondansetron Hives, Swelling, and Rash 03/09/2015   Benzonatate Rash and Other (See Comments) 09/21/2015   Bromfed dm [pseudoeph-bromphen-dm] Anxiety 09/17/2021   Diclofenac sodium Rash 08/14/2018   Flexeril [cyclobenzaprine] Anxiety 01/18/2017   Morphine and codeine Itching 11/09/2013    Family History  Problem Relation Age of Onset   Liver disease Mother    Dementia Mother    Cirrhosis Mother    Prostate cancer Father    Colon cancer Maternal Grandmother    Ovarian cancer Maternal Aunt     Social History    Socioeconomic History   Marital status: Divorced    Spouse name: Not on file   Number of children: 1   Years of education: Not on file   Highest education level: Associate degree: academic program  Occupational History   Occupation: unemployed  Tobacco Use   Smoking status: Never   Smokeless tobacco: Never  Vaping Use   Vaping status: Never Used  Substance and Sexual Activity   Alcohol use: Not Currently    Alcohol/week: 0.0 standard drinks of alcohol   Drug use: No   Sexual activity: Not on file  Other Topics Concern   Not on file  Social History Narrative   Pateint is right-handed. She lives alone in a single level home. She rarely drinks caffeine. She is limited to exercise due to knee injuries.   Social Drivers of Health   Financial Resource Strain: High Risk (06/05/2022)   [facility] prior to 11/12/2022.   Overall Financial  Resource Strain (CARDIA)    Difficulty of Paying Living Expenses: Hard  Food Insecurity: No Food Insecurity (12/13/2023)   Hunger Vital Sign    Worried About Running Out of Food in the Last Year: Never true    Ran Out of Food in the Last Year: Never true  Transportation Needs: No Transportation Needs (12/13/2023)   PRAPARE - Administrator, Civil Service (Medical): No    Lack of Transportation (Non-Medical): No  Physical Activity: Inactive (06/05/2022)   [facility] to [facility]12[facility] visits prior to 11/12/2022.   Exercise Vital Sign    Days of Exercise per Week: 0 days    Minutes of Exercise per Session: 10 min  Stress: Stress Concern Present (06/05/2022)   [facility]12/[facility]22[facility] visits prior  to 11/12/2022.   [employer] of Occupational Health - Occupational Stress Questionnaire    Feeling of Stress : Rather much  Social Connections: Moderately Isolated (06/05/2022)   [facility] prior to 11/12/[facility]22[facility] visits prior to 11/12/2022.   Social Connection and Isolation Panel [NHANES]    Frequency of Communication with Friends and Family: More than three times a week    Frequency of Social Gatherings with Friends and Family: Twice a week    Attends Religious Services: More than 4 times per year    Active Member of Clubs or Organizations: No    Attends Banker Meetings: Patient declined    Marital Status: Divorced  Catering manager Violence: Not At Risk (12/13/2023)   Humiliation, Afraid, Rape, and Kick questionnaire    Fear of Current or Ex-Partner: No    Emotionally Abused: No    Physically Abused: No    Sexually Abused: No    Review of Systems: See HPI, otherwise negative ROS  Physical Exam: Vital signs in last 24 hours: Temp:  [97 F (36.1 C)-98.1 F (36.7 C)] 98.1 F (36.7 C) (04/03 1312) Pulse Rate:  [81-97] 97 (04/03 1312) Resp:  [16-18] 18 (04/03 1312) BP: (87-109)/(58-82) 109/82 (04/03 1312) SpO2:  [96 %-98 %] 96 % (04/03 1312) Weight:  [80.3 kg] 80.3 kg (04/03 1312) Last BM Date : 12/12/23 General:   Alert,  Well-developed, well-nourished, pleasant and cooperative in NAD Head:  Normocephalic and atraumatic. Eyes:  Sclera clear, no icterus.   Conjunctiva pink. Ears:  Normal auditory acuity. Nose:  No deformity, discharge,  or lesions. Msk:  Symmetrical without gross deformities. Normal posture. Extremities:  Without clubbing or edema. Neurologic:  Alert and  oriented x4;  grossly normal neurologically. Skin:  Intact without significant lesions or rashes. Psych:  Alert and cooperative. Normal mood and affect.  Impression/Plan: Sabrina [name] is  a 58 y.o. year old female with history of arthritis, bullous pemphigus, osteomyelitis, anxiety and hematemesis secondary to esophagitis and gastritis who presented to the ED with c/o hematemesis . Proceed with EGD   The risks of the procedure including infection, bleed, or perforation as well as benefits, limitations, alternatives and imponderables have been reviewed with the patient. Questions have been answered. All parties agreeable.

## 2023-12-14 NOTE — Anesthesia Postprocedure Evaluation (Signed)
 Anesthesia Post Note  Patient: Sabrina [name]  Procedure(s) Performed: EGD (ESOPHAGOGASTRODUODENOSCOPY) EGD, WITH ARGON PLASMA COAGULATION  Patient location during evaluation: PACU Anesthesia Type: General Level of consciousness: awake and alert Pain management: pain level controlled Vital Signs Assessment: post-procedure vital signs reviewed and stable Respiratory status: spontaneous breathing, nonlabored ventilation, respiratory function stable and patient connected to nasal cannula oxygen Cardiovascular status: blood pressure returned to baseline and stable Postop Assessment: no apparent nausea or vomiting Anesthetic complications: no   There were no known notable events for this encounter.   Last Vitals:  Vitals:   12/14/23 1312 12/14/23 1415  BP: 109/82 123/87  Pulse: 97 99  Resp: 18 15  Temp: 36.7 C 36.4 C  SpO2: 96% 97%    Last Pain:  Vitals:   12/14/23 1415  TempSrc:   PainSc: 7                  La Dibella L Hamed Debella

## 2023-12-14 NOTE — Anesthesia Preprocedure Evaluation (Signed)
 Anesthesia Evaluation  Patient identified by MRN, date of birth, ID band Patient awake    Reviewed: Allergy & Precautions, H&P , NPO status , Patient's Chart, lab work & pertinent test results  History of Anesthesia Complications (+) PONV and history of anesthetic complications  Airway Mallampati: II  TM Distance: >3 FB Neck ROM: Full    Dental no notable dental hx. (+) Dental Advisory Given, Teeth Intact   Pulmonary neg pulmonary ROS   Pulmonary exam normal breath sounds clear to auscultation       Cardiovascular negative cardio ROS Normal cardiovascular exam Rhythm:Regular Rate:Normal     Neuro/Psych  Headaches PSYCHIATRIC DISORDERS Anxiety Depression     Neuromuscular disease    GI/Hepatic Neg liver ROS,GERD  ,,N/V   Endo/Other  negative endocrine ROS    Renal/GU negative Renal ROS  negative genitourinary   Musculoskeletal  (+) Arthritis ,    Abdominal   Peds negative pediatric ROS (+)  Hematology  (+) Blood dyscrasia, anemia   Anesthesia Other Findings   Reproductive/Obstetrics negative OB ROS                             Anesthesia Physical Anesthesia Plan  ASA: 2  Anesthesia Plan: General   Post-op Pain Management: Minimal or no pain anticipated   Induction: Intravenous  PONV Risk Score and Plan: 1 and Propofol infusion  Airway Management Planned: Simple Face Mask, Nasal Cannula and Natural Airway  Additional Equipment: None  Intra-op Plan:   Post-operative Plan:   Informed Consent: I have reviewed the patients History and Physical, chart, labs and discussed the procedure including the risks, benefits and alternatives for the proposed anesthesia with the patient or authorized representative who has indicated his/her understanding and acceptance.     Dental advisory given  Plan Discussed with: CRNA  Anesthesia Plan Comments:        Anesthesia Quick  Evaluation

## 2023-12-14 NOTE — Progress Notes (Signed)
 PROGRESS NOTE  Sabrina [name] ZOX:[id number] DOB: 09/21/1965 DOA: 12/13/2023 PCP: [name], MD  Brief History:  58 year old female with a history of bullous pemphigoid on prior  methotrexate/steoprids follows with [facility] Dr. 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  Notably, the patient was admitted to the hospital from 10/26/2023 to 10/28/2023 for hematemesis.  GI was consulted at that time and performed EGD on 10/26/2023.  It showed duodenal mucosal lymphangiectasia, gastritis, and grade a reflux esophagitis.  There was a 2 cm hiatal hernia.  The patient was discharged home with PPI twice daily with instructions to follow-up in the office. The patient denies any NSAIDs, alcohol, or illicit drugs. She states that she had to large-volume hematemesis on 12/12/2023.  After she took her morning medications of 4-25 she had a small amount of hematemesis.  The patient denies any hematochezia or melena.  She denies any fevers, chills, chest pain, shortness of breath, hemoptysis, dysuria, hematuria.  She states that she has some upper abdominal pain with the hematemesis. She states that she will have episodes of emesis without blood, but will also have episodes of hematemesis without any antecedent bilious vomiting.  She has lost 4 pounds since her last hospital admission. In the ED, the patient was afebrile and hemodynamically stable with oxygen saturation 97% room air.  WBC 4.6, hemoglobin 12.6, platelets 306.  Sodium 139, potassium 4.4, bicarbonate 23, serum creatinine 0.79.  LFTs were unremarkable.  GI was consulted to assist with management.   Assessment/Plan:  Intractable nausea and vomiting/hematemesis -GI consult appreciated -Hemoglobin stable since last hospital  admission -continue PPI twice daily -continue sulcrafate -Patient does take phentermine daily -12/14/23--EGD planned -Hgb remains stable   Depression/anxiety -Continue home dose diazepam and venlafaxine -PDMP reviewed--denies, 10 mg, last refill 12/08/2023   Chronic pain syndrome -Continue gabapentin   Multinodular goiter -11/24/2023 thyroid biopsy--benign follicular nodule   History of bullous pemphigoid -Now off of any immunomodulators and DMA RD's-outpatient follow-up with her dermatologist at [facility] as above for further management  Obesity -BMI 30.29 -lifestyle modfication       Family Communication:   no Family at bedside  Consultants:  GI  Code Status:  FULL  DVT Prophylaxis:  SCDs   Procedures: As Listed in Progress Note Above  Antibiotics: None    Subjective: Pt endorsed one episode small volume emesis with small amount of blood since admission, none overnight.  States abd pain is "a little better".  Denies f/,c cp, sob  Objective: Vitals:   12/13/23 0657 12/13/23 1300 12/13/23 1952 12/14/23 0308  BP: (!) 126/93 105/66 (!) 87/58 106/72  Pulse: (!) 108 82 81 93  Resp: 18 18 18 18   Temp: 98.1 F (36.7 C) 97.8 F (36.6 C) 97.7 F (36.5 C) (!) 97 F (36.1 C)  TempSrc: Oral Oral Oral Oral  SpO2: 97% 92% 96% 97%  Weight: 80.3 kg     Height: 5\' 4"  (1.626 m)      No intake or output data in the 24 hours ending 12/14/23 0751 Weight change:  Exam:  General:  Pt is alert, follows commands appropriately, not in acute distress HEENT: No icterus, No thrush, No neck mass, [place]/AT Cardiovascular: RRR, S1/S2, no rubs, no gallops Respiratory: CTA bilaterally, no wheezing,  no crackles, no rhonchi Abdomen: Soft/+BS, non tender, non distended, no guarding Extremities: No edema, No lymphangitis, No petechiae, No rashes, no synovitis   Data Reviewed: I have personally reviewed following labs and imaging studies Basic Metabolic Panel: Recent Labs  Lab  12/13/23 0809 12/14/23 0416  NA 139 138  K 4.4 3.8  CL 106 104  CO2 23 25  GLUCOSE 105* 98  BUN 29* 28*  CREATININE 0.79 0.78  CALCIUM 9.5 8.8*   Liver Function Tests: Recent Labs  Lab 12/13/23 0809  AST 20  ALT 20  ALKPHOS 92  BILITOT 0.4  PROT 7.1  ALBUMIN 3.8   Recent Labs  Lab 12/13/23 0809  LIPASE 39   No results for input(s): "AMMONIA" in the last 168 hours. Coagulation Profile: No results for input(s): "INR", "PROTIME" in the last 168 hours. CBC: Recent Labs  Lab 12/13/23 0733 12/14/23 0416  WBC 4.6 4.6  NEUTROABS 2.6  --   HGB 12.6 12.0  HCT 39.4 38.1  MCV 93.4 94.5  PLT 306 278   Cardiac Enzymes: No results for input(s): "CKTOTAL", "CKMB", "CKMBINDEX", "TROPONINI" in the last 168 hours. BNP: Invalid input(s): "POCBNP" CBG: No results for input(s): "GLUCAP" in the last 168 hours. HbA1C: No results for input(s): "HGBA1C" in the last 72 hours. Urine analysis:    Component Value Date/Time   COLORURINE COLORLESS (A) 10/26/2023 0802   APPEARANCEUR HAZY (A) 10/26/2023 0802   LABSPEC 1.009 10/26/2023 0802   PHURINE 6.0 10/26/2023 0802   GLUCOSEU NEGATIVE 10/26/2023 0802   HGBUR NEGATIVE 10/26/2023 0802   BILIRUBINUR NEGATIVE 10/26/2023 0802   KETONESUR NEGATIVE 10/26/2023 0802   PROTEINUR NEGATIVE 10/26/2023 0802   UROBILINOGEN 0.2 07/05/2015 0627   NITRITE NEGATIVE 10/26/2023 0802   LEUKOCYTESUR NEGATIVE 10/26/2023 0802   Sepsis Labs: @LABRCNTIP (procalcitonin:4,lacticidven:4) )No results found for this or any previous visit (from the past 240 hours).   Scheduled Meds:  gabapentin  600 mg Oral TID   pantoprazole (PROTONIX) IV  40 mg Intravenous Q12H   rosuvastatin  20 mg Oral Daily   sucralfate  1 g Oral TID WC & HS   venlafaxine XR  150 mg Oral q morning   Continuous Infusions:  Procedures/Studies: [place] FNA BX THYROID 1ST LESION AFIRMA Result Date: 11/24/2023 INDICATION: Indeterminate thyroid nodule EXAM: ULTRASOUND GUIDED FINE NEEDLE  ASPIRATION OF INDETERMINATE THYROID NODULE COMPARISON:  US Thyroid dated November 03, 2023 MEDICATIONS: Lidocaine 1% - 4 mls COMPLICATIONS: None immediate. TECHNIQUE: Informed written consent was obtained from the patient after a discussion of the risks, benefits and alternatives to treatment. Questions regarding the procedure were encouraged and answered. A timeout was performed prior to the initiation of the procedure. Pre-procedural ultrasound scanning demonstrated unchanged size and appearance of the indeterminate nodule within the inferior aspect of the left thyroid The procedure was planned. The neck was prepped in the usual sterile fashion, and a sterile drape was applied covering the operative field. A timeout was performed prior to the initiation of the procedure. Local anesthesia was provided with 1% lidocaine. Under direct ultrasound guidance, 5 FNA biopsies were performed of the nodule located in the inferior aspecr of the left thyroid with a 25 gauge needle. Two samples were sent to [facility] per ordering MD. Multiple ultrasound images were saved for procedural documentation purposes. The samples were prepared and submitted to pathology. Limited post procedural scanning was negative for hematoma or additional complication. Dressings were placed. The patient tolerated the above procedures procedure well without immediate postprocedural complication.  FINDINGS: Nodule reference number based on prior diagnostic ultrasound: 5 Maximum size: 1.6 cm Location: Left; Inferior ACR TI-RADS risk category: 3 Reason for biopsy: meets ACR TI-RADS criteria Ultrasound imaging confirms appropriate placement of the needles within the thyroid nodule. IMPRESSION: Technically successful ultrasound guided fine needle aspiration of nodule located in the inferior aspect of the left thyroid. Performed by Anders Grant NP Electronically Signed   By: Marliss Coots M.D.   On: 11/24/2023 11:18    Catarina Hartshorn, DO  [facility]  If 7PM-7AM, please contact night-coverage [email or web address] Password [email or web address] 12/14/2023, 7:51 AM   LOS: 0 days

## 2023-12-14 NOTE — Plan of Care (Signed)

## 2023-12-14 NOTE — Op Note (Signed)
 [facility] Patient Name: Sabrina [name] Procedure Date: 12/14/2023 1:17 PM MRN: [id number] Date of Birth: 1966-05-08 Attending MD: Sanjuan Dame , MD, [id number] CSN: [id number] Age: 58 Admit Type: Inpatient Procedure:                Upper GI endoscopy Indications:              Hematemesis Providers:                Sanjuan Dame, MD, Francoise Ceo RN, RN, Elinor Parkinson Referring MD:              Medicines:                Monitored Anesthesia Care Complications:            No immediate complications. Estimated Blood Loss:     Estimated blood loss: none. Procedure:                Pre-Anesthesia Assessment:                           - Prior to the procedure, a History and Physical                            was performed, and patient medications and                            allergies were reviewed. The patient's tolerance of                            previous anesthesia was also reviewed. The risks                            and benefits of the procedure and the sedation                            options and risks were discussed with the patient.                            All questions were answered, and informed consent                            was obtained. Prior Anticoagulants: The patient has                            taken no anticoagulant or antiplatelet agents. ASA                            Grade Assessment: II - A patient with mild systemic                            disease. After reviewing the risks and benefits,  the patient was deemed in satisfactory condition to                            undergo the procedure.                           After obtaining informed consent, the endoscope was                            passed under direct vision. Throughout the                            procedure, the patient's blood pressure, pulse, and                            oxygen saturations were monitored continuously.  The                            [id number]-[id number] ([id number]) scope was introduced through the                            mouth, and advanced to the second part of duodenum.                            The patient tolerated the procedure well. Scope In: 1:43:16 PM Scope Out: 2:10:10 PM Total Procedure Duration: 0 hours 26 minutes 54 seconds  Findings:      The examined esophagus was normal.      Diffuse mild inflammation characterized by erythema was found in the       entire examined stomach. Biopsies were taken with a cold forceps for       histology.      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 Coagulation for hemostasis using argon plasma at 0.3       liters/minute and 20 watts was successful.      A single small angioectasia without bleeding was found in the second       portion of the duodenum. Coagulation for hemostasis using argon plasma       at 0.3 liters/minute and 20 watts was successful. Impression:               - Normal esophagus.                           - Gastritis. Biopsied.                           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 Treated                            with argon plasma coagulation (APC). Many of them                            were actively oozing and others with stigmata of  recent bleed.                           - A single non-bleeding angioectasia in the                            duodenum. Treated with argon plasma coagulation                            (APC). Moderate Sedation:      Per Anesthesia Care Recommendation:           Consider obatining TTE (ECHO) to evaluate for any AS                           RUQ sono , Hep B and Hep C serologies                           Protonix 40mg  daily                           Advance diet as tolerated                           Follow up biopsy results                           Follow up in GI clinic Procedure Code(s):        ---  Professional ---                           43255, 59, Esophagogastroduodenoscopy, flexible,                            transoral; with control of bleeding, any method                           43239, 51, Esophagogastroduodenoscopy, flexible,                            transoral; with biopsy, single or multiple Diagnosis Code(s):        --- Professional ---                           K29.70, Gastritis, unspecified, without bleeding                           K31.811, Angiodysplasia of stomach and duodenum                            with bleeding                           K92.0, Hematemesis CPT copyright 2022 American Medical Association. All rights reserved. The codes documented in this report are preliminary and upon coder review may  be revised to meet current compliance requirements. Sanjuan Dame, MD Sanjuan Dame, MD 12/14/2023 2:22:59 PM This report has been signed electronically. Number of  Addenda: 0

## 2023-12-14 NOTE — Transfer of Care (Addendum)
 Immediate Anesthesia Transfer of Care Note  Patient: Sabrina [name]  Procedure(s) Performed: EGD (ESOPHAGOGASTRODUODENOSCOPY) EGD, WITH ARGON PLASMA COAGULATION  Patient Location: PACU  Anesthesia Type:General  Level of Consciousness: drowsy and patient cooperative  Airway & Oxygen Therapy: Patient Spontanous Breathing  Post-op Assessment: Report given to RN and Post -op Vital signs reviewed and stable  Post vital signs: Reviewed and stable  Last Vitals:  Vitals Value Taken Time  BP 123/87 12/14/23   1415  Temp    Pulse 97 12/14/23 1415  Resp 14 12/14/23 1415  SpO2 96 % 12/14/23 1415  Vitals shown include unfiled device data.  Last Pain:  Vitals:   12/14/23 1337  TempSrc:   PainSc: 6          Complications: No notable events documented.

## 2023-12-15 ENCOUNTER — Other Ambulatory Visit (HOSPITAL_COMMUNITY): Payer: Self-pay | Admitting: *Deleted

## 2023-12-15 ENCOUNTER — Observation Stay (HOSPITAL_COMMUNITY)

## 2023-12-15 ENCOUNTER — Encounter (HOSPITAL_COMMUNITY): Payer: Self-pay | Admitting: Gastroenterology

## 2023-12-15 DIAGNOSIS — K31819 Angiodysplasia of stomach and duodenum without bleeding: Secondary | ICD-10-CM

## 2023-12-15 DIAGNOSIS — K5520 Angiodysplasia of colon without hemorrhage: Secondary | ICD-10-CM

## 2023-12-15 DIAGNOSIS — L120 Bullous pemphigoid: Secondary | ICD-10-CM | POA: Diagnosis not present

## 2023-12-15 DIAGNOSIS — R0609 Other forms of dyspnea: Secondary | ICD-10-CM | POA: Diagnosis not present

## 2023-12-15 DIAGNOSIS — K920 Hematemesis: Secondary | ICD-10-CM | POA: Diagnosis not present

## 2023-12-15 DIAGNOSIS — R1110 Vomiting, unspecified: Secondary | ICD-10-CM | POA: Diagnosis not present

## 2023-12-15 LAB — ECHOCARDIOGRAM COMPLETE
AR max vel: 1.84 cm2
AV Area VTI: 1.95 cm2
AV Area mean vel: 2.07 cm2
AV Mean grad: 2.4 mmHg
AV Peak grad: 5.1 mmHg
Ao pk vel: 1.13 m/s
Area-P 1/2: 2.32 cm2
Height: 64 in
S' Lateral: 3.1 cm
Weight: 2832.47 [oz_av]

## 2023-12-15 LAB — HEPATITIS PANEL, ACUTE
HCV Ab: NONREACTIVE
Hep A IgM: NONREACTIVE
Hep B C IgM: NONREACTIVE
Hepatitis B Surface Ag: NONREACTIVE

## 2023-12-15 LAB — CBC
HCT: 38.8 % (ref 36.0–46.0)
Hemoglobin: 12.2 g/dL (ref 12.0–15.0)
MCH: 29.5 pg (ref 26.0–34.0)
MCHC: 31.4 g/dL (ref 30.0–36.0)
MCV: 93.9 fL (ref 80.0–100.0)
Platelets: 264 10*3/uL (ref 150–400)
RBC: 4.13 MIL/uL (ref 3.87–5.11)
RDW: 14.6 % (ref 11.5–15.5)
WBC: 5 10*3/uL (ref 4.0–10.5)
nRBC: 0 % (ref 0.0–0.2)

## 2023-12-15 MED ORDER — OXYCODONE HCL 5 MG PO TABS
5.0000 mg | ORAL_TABLET | Freq: Four times a day (QID) | ORAL | Status: DC | PRN
  Administered 2023-12-15: 5 mg via ORAL
  Filled 2023-12-15: qty 1

## 2023-12-15 MED ORDER — OXYCODONE HCL 5 MG PO TABS
5.0000 mg | ORAL_TABLET | Freq: Once | ORAL | Status: AC | PRN
  Administered 2023-12-15: 5 mg via ORAL
  Filled 2023-12-15: qty 1

## 2023-12-15 MED ORDER — PANTOPRAZOLE SODIUM 40 MG PO TBEC: 40.0000 mg | DELAYED_RELEASE_TABLET | Freq: Two times a day (BID) | ORAL | 1 refills | Status: DC

## 2023-12-15 MED ORDER — OXYCODONE HCL 5 MG PO TABS: 5.0000 mg | ORAL_TABLET | Freq: Four times a day (QID) | ORAL | 0 refills | Status: DC | PRN

## 2023-12-15 MED ORDER — PANTOPRAZOLE SODIUM 40 MG PO TBEC: 40.0000 mg | DELAYED_RELEASE_TABLET | Freq: Two times a day (BID) | ORAL | Status: DC

## 2023-12-15 NOTE — Plan of Care (Signed)
  Problem: Pain Managment: Goal: General experience of comfort will improve and/or be controlled Outcome: Progressing   Problem: Safety: Goal: Ability to remain free from injury will improve Outcome: Progressing   Problem: Skin Integrity: Goal: Risk for impaired skin integrity will decrease Outcome: Progressing

## 2023-12-15 NOTE — Progress Notes (Signed)
*  PRELIMINARY RESULTS* Echocardiogram 2D Echocardiogram has been performed.  Sabrina [name] 12/15/2023, 1:39 PM

## 2023-12-15 NOTE — Progress Notes (Signed)
 Patient has discharge orders, discharge instructions given and no further questions at this time, patient wheeled down to main entrance and friend will be picking patient up.

## 2023-12-15 NOTE — Progress Notes (Signed)
 Gastroenterology Progress Note   Referring Provider: No ref. provider found Primary Care Physician:  Randel Pigg, Dorma Russell, MD Primary Gastroenterologist:  Vista Lawman, MD   Patient ID: Sabrina [name]; [id number]; 09/22/1965    Subjective   Feeling well this morning. Able to tolerate breakfast without any vomiting. Has some mild upper abdominal pain but significantly improved from prior.   Objective   Vital signs in last 24 hours Temp:  [97.6 F (36.4 C)-98.2 F (36.8 C)] 97.7 F (36.5 C) (04/04 0609) Pulse Rate:  [89-109] 109 (04/04 0609) Resp:  [15-18] 18 (04/04 0609) BP: (94-123)/(60-87) 102/76 (04/04 0609) SpO2:  [94 %-98 %] 98 % (04/04 0609) Weight:  [80.3 kg] 80.3 kg (04/03 1312) Last BM Date : 12/12/23  Physical Exam General:   Alert and oriented, pleasant Head:  Normocephalic and atraumatic. Eyes:  No icterus, sclera clear. Conjuctiva pink.  Neurologic:  Alert and  oriented x4;  grossly normal neurologically. Skin:  Warm and dry, intact without significant lesions.  Psych:  Alert and cooperative. Normal mood and affect.  Intake/Output from previous day: 04/03 0701 - 04/04 0700 In: 500 [I.V.:500] Out: -  Intake/Output this shift: No intake/output data recorded.  Lab Results  Recent Labs    12/13/23 0733 12/14/23 0416 12/15/23 0448  WBC 4.6 4.6 5.0  HGB 12.6 12.0 12.2  HCT 39.4 38.1 38.8  PLT 306 278 264   BMET Recent Labs    12/13/23 0809 12/14/23 0416  NA 139 138  K 4.4 3.8  CL 106 104  CO2 23 25  GLUCOSE 105* 98  BUN 29* 28*  CREATININE 0.79 0.78  CALCIUM 9.5 8.8*   LFT Recent Labs    12/13/23 0809  PROT 7.1  ALBUMIN 3.8  AST 20  ALT 20  ALKPHOS 92  BILITOT 0.4   PT/INR No results for input(s): "LABPROT", "INR" in the last 72 hours. Hepatitis Panel No results for input(s): "HEPBSAG", "HCVAB", "HEPAIGM", "HEPBIGM" in the last 72 hours.  Studies/Results US Abdomen Limited RUQ (LIVER/GB) Result Date:  12/14/2023 CLINICAL DATA:  Cirrhosis EXAM: ULTRASOUND ABDOMEN LIMITED RIGHT UPPER QUADRANT COMPARISON:  Ultrasound 11/03/2023.  CT 10/26/2023. FINDINGS: Gallbladder: Prior cholecystectomy Common bile duct: Diameter: 6 mm Liver: Homogeneous hepatic parenchyma. Multiple cystic areas are identified as on previous examination. There is a large area anteriorly measuring 7 cm. Smaller foci also identified. A few these have multiple septa. Portal vein is patent on color Doppler imaging with normal direction of blood flow towards the liver. Other: None. IMPRESSION: Prior cholecystectomy. No ductal dilatation. Numerous hepatic cysts are present, some of which appear mildly complex by ultrasound. Please correlate with prior CT scan. Electronically Signed   By: Karen Kays M.D.   On: 12/14/2023 16:26   [place] FNA BX THYROID 1ST LESION AFIRMA Result Date: 11/24/2023 INDICATION: Indeterminate thyroid nodule EXAM: ULTRASOUND GUIDED FINE NEEDLE ASPIRATION OF INDETERMINATE THYROID NODULE COMPARISON:  US Thyroid dated November 03, 2023 MEDICATIONS: Lidocaine 1% - 4 mls COMPLICATIONS: None immediate. TECHNIQUE: Informed written consent was obtained from the patient after a discussion of the risks, benefits and alternatives to treatment. Questions regarding the procedure were encouraged and answered. A timeout was performed prior to the initiation of the procedure. Pre-procedural ultrasound scanning demonstrated unchanged size and appearance of the indeterminate nodule within the inferior aspect of the left thyroid The procedure was planned. The neck was prepped in the usual sterile fashion, and a sterile drape was applied covering the operative field. A timeout was  performed prior to the initiation of the procedure. Local anesthesia was provided with 1% lidocaine. Under direct ultrasound guidance, 5 FNA biopsies were performed of the nodule located in the inferior aspecr of the left thyroid with a 25 gauge needle. Two samples were  sent to [facility] per ordering MD. Multiple ultrasound images were saved for procedural documentation purposes. The samples were prepared and submitted to pathology. Limited post procedural scanning was negative for hematoma or additional complication. Dressings were placed. The patient tolerated the above procedures procedure well without immediate postprocedural complication. FINDINGS: Nodule reference number based on prior diagnostic ultrasound: 5 Maximum size: 1.6 cm Location: Left; Inferior ACR TI-RADS risk category: 3 Reason for biopsy: meets ACR TI-RADS criteria Ultrasound imaging confirms appropriate placement of the needles within the thyroid nodule. IMPRESSION: Technically successful ultrasound guided fine needle aspiration of nodule located in the inferior aspect of the left thyroid. Performed by Anders Grant NP Electronically Signed   By: Marliss Coots M.D.   On: 11/24/2023 11:18    Assessment  58 y.o. female with a history of hematemesis who underwent EGD in Feb with findings of grade A esophagitis and gastritis who presented to ED with recurrent hematemesis with stable Hgb. GI consulted for further evaluation.   Hematemesis:  -EGD in February with esophagitis and gastritis with recommendation for PPI BID and avoidance of NSAIDs -Previous hospitalization for hematemesis suspected to be secondary to esophagitis and gastritis.  -Represented to the ED at recommendation of our office due to reported episodes of hematemesis at home.  -repeat EGD yesterday with 13 oozing gastric angiectasias s/p APC therapy and non bleeding angiectasia s/p APC in the duodenum. Esophagus healed but with ongoing gastritis.  -Echo scheduled for today to rule out AS, RUQ [place] with patent portal vein and multiple small hepatic cysts - no cirrhosis. Acute hep panel pending.    Plan / Recommendations  Continue PPI BID Avoid NSAIDs Our office will follow up on pending pathology Proceed with echo today Can continue  carafate for 1 week.  She should call the office if hematemesis returns, if severe or signs of blood loss (dizziness, lightheaded, syncope, large output) then she should return to ED.  Keep hospital follow up appointment for 4/29  Patient stable and appropriate for discharge from GI standpoint as long as able to tolerate regular diet (vitals and hgb normal). She will follow up as stated above.    LOS: 0 days    12/15/2023, 9:59 AM   Brooke Bonito, MSN, FNP-BC, AGACNP-BC [facility]

## 2023-12-15 NOTE — Discharge Summary (Signed)
 Physician Discharge Summary   Patient: Sabrina [name] MRN: [id number] DOB: [birth year]  Admit date:     12/13/2023  Discharge date: 12/15/23  Discharge Physician: Onalee Hua Ruhani Umland   PCP: Randel Pigg, Dorma Russell, MD   Recommendations at discharge:   Please follow up with primary care provider within 1-2 weeks  Please repeat BMP and CBC in one week     Hospital Course: 58 year old female with a history of bullous pemphigoid on prior  methotrexate/steoprids follows with [facility] Dr. 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  Notably, the patient was admitted to the hospital from 10/26/2023 to 10/28/2023 for hematemesis.  GI was consulted at that time and performed EGD on 10/26/2023.  It showed duodenal mucosal lymphangiectasia, gastritis, and grade a reflux esophagitis.  There was a 2 cm hiatal hernia.  The patient was discharged home with PPI twice daily with instructions to follow-up in the office. The patient denies any NSAIDs, alcohol, or illicit drugs. She states that she had to large-volume hematemesis on 12/12/2023.  After she took her morning medications of 4-25 she had a small amount of hematemesis.  The patient denies any hematochezia or melena.  She denies any fevers, chills, chest pain, shortness of breath, hemoptysis, dysuria, hematuria.  She states that she has some upper abdominal pain with the hematemesis. She states that she will have episodes of emesis without blood, but will also have episodes of hematemesis without any antecedent bilious vomiting.  She has lost 4 pounds since her last hospital admission. In the ED, the patient was afebrile and hemodynamically stable with oxygen saturation 97% room air.  WBC 4.6, hemoglobin 12.6, platelets 306.  Sodium 139, potassium 4.4,  bicarbonate 23, serum creatinine 0.79.  LFTs were unremarkable.  GI was consulted to assist with management.  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 No ductal dilatation. Numerous hepatic cysts  -diet advanced post-EGD and pt tolerated -12/15/23 Echo--results pending at time of dc   Depression/anxiety -Continue home dose diazepam and venlafaxine -PDMP reviewed--denies, 10 mg, last refill 12/08/2023   Chronic pain syndrome -Continue gabapentin   Multinodular goiter -11/24/2023 thyroid biopsy--benign follicular nodule   History of bullous pemphigoid -Now off of any immunomodulators and DMA RD's-outpatient follow-up with her dermatologist at [facility] as above for further management   Obesity -BMI 30.29 -lifestyle modfication      Consultants: GI Procedures performed: EGD 12/14/23  Disposition: Home Diet recommendation:  Cardiac diet DISCHARGE MEDICATION: Allergies as of 12/15/2023       Reactions   Droperidol Palpitations, Other (See Comments)   Elevates BP increases HR   Ondansetron Hives, Swelling, Rash   Spots around IV site IV Zofran   Benzonatate Rash, Other (See Comments)   Caused burning pain in eyes  Watery eyes  Vision changes  Conjunctivitis   Bromfed Dm [pseudoeph-bromphen-dm] Anxiety   Diclofenac Sodium Rash   Flexeril [cyclobenzaprine] Anxiety   Morphine And Codeine Itching   Pt reports she has had Morphine since the reaction and she didn't have a problem with it.  Medication List     STOP taking these medications    amoxicillin-clavulanate 875-125 MG tablet Commonly known as: AUGMENTIN    promethazine 12.5 MG tablet Commonly known as: PHENERGAN   Ubrogepant 100 MG Tabs       TAKE these medications    butalbital-acetaminophen-caffeine 50-325-40 MG tablet Commonly known as: FIORICET Take 1 tablet by mouth daily as needed.   diazepam 10 MG tablet Commonly known as: VALIUM 1 qam  2 qhs   Ferrex 150 150 MG capsule Generic drug: iron polysaccharides Take 150 mg by mouth daily.   gabapentin 300 MG capsule Commonly known as: NEURONTIN Take 2 capsules (600 mg total) by mouth 3 (three) times daily.   oxyCODONE 5 MG immediate release tablet Commonly known as: Oxy IR/ROXICODONE Take 1 tablet (5 mg total) by mouth every 6 (six) hours as needed for moderate pain (pain score 4-6). What changed:  when to take this reasons to take this   pantoprazole 40 MG tablet Commonly known as: PROTONIX Take 1 tablet (40 mg total) by mouth 2 (two) times daily. What changed: when to take this   rosuvastatin 20 MG tablet Commonly known as: CRESTOR Take 1 tablet by mouth daily.   tiZANidine 4 MG tablet Commonly known as: ZANAFLEX Take 4 mg by mouth at bedtime as needed for muscle spasms.   venlafaxine XR 150 MG 24 hr capsule Commonly known as: EFFEXOR-XR Take 1 capsule (150 mg total) by mouth every morning. What changed: Another medication with the same name was removed. Continue taking this medication, and follow the directions you see here.        Discharge Exam: Filed Weights   12/13/23 0657 12/14/23 1312  Weight: 80.3 kg 80.3 kg   HEENT:  [place]/AT, No thrush, no icterus CV:  RRR, no rub, no S3, no S4 Lung:  CTA, no wheeze, no rhonchi Abd:  soft/+BS, NT Ext:  No edema, no lymphangitis, no synovitis, no rash   Condition at discharge: stable  The results of significant diagnostics from this hospitalization (including imaging, microbiology, ancillary and laboratory) are listed below for reference.   Imaging Studies: US Abdomen Limited RUQ (LIVER/GB) Result Date:  12/14/2023 CLINICAL DATA:  Cirrhosis EXAM: ULTRASOUND ABDOMEN LIMITED RIGHT UPPER QUADRANT COMPARISON:  Ultrasound 11/03/2023.  CT 10/26/2023. FINDINGS: Gallbladder: Prior cholecystectomy Common bile duct: Diameter: 6 mm Liver: Homogeneous hepatic parenchyma. Multiple cystic areas are identified as on previous examination. There is a large area anteriorly measuring 7 cm. Smaller foci also identified. A few these have multiple septa. Portal vein is patent on color Doppler imaging with normal direction of blood flow towards the liver. Other: None. IMPRESSION: Prior cholecystectomy. No ductal dilatation. Numerous hepatic cysts are present, some of which appear mildly complex by ultrasound. Please correlate with prior CT scan. Electronically Signed   By: Karen Kays M.D.   On: 12/14/2023 16:26   [place] FNA BX THYROID 1ST LESION AFIRMA Result Date: 11/24/2023 INDICATION: Indeterminate thyroid nodule EXAM: ULTRASOUND GUIDED FINE NEEDLE ASPIRATION OF INDETERMINATE THYROID NODULE COMPARISON:  US Thyroid dated November 03, 2023 MEDICATIONS: Lidocaine 1% - 4 mls COMPLICATIONS: None immediate. TECHNIQUE: Informed written consent was obtained from the patient after a discussion of the risks, benefits and alternatives to treatment. Questions regarding the procedure were encouraged and answered. A timeout was performed prior to the initiation of the procedure. Pre-procedural ultrasound scanning demonstrated unchanged size and appearance of the indeterminate nodule within the inferior aspect of the left thyroid The procedure was planned. The  neck was prepped in the usual sterile fashion, and a sterile drape was applied covering the operative field. A timeout was performed prior to the initiation of the procedure. Local anesthesia was provided with 1% lidocaine. Under direct ultrasound guidance, 5 FNA biopsies were performed of the nodule located in the inferior aspecr of the left thyroid with a 25 gauge needle. Two samples were  sent to [facility] per ordering MD. Multiple ultrasound images were saved for procedural documentation purposes. The samples were prepared and submitted to pathology. Limited post procedural scanning was negative for hematoma or additional complication. Dressings were placed. The patient tolerated the above procedures procedure well without immediate postprocedural complication. FINDINGS: Nodule reference number based on prior diagnostic ultrasound: 5 Maximum size: 1.6 cm Location: Left; Inferior ACR TI-RADS risk category: 3 Reason for biopsy: meets ACR TI-RADS criteria Ultrasound imaging confirms appropriate placement of the needles within the thyroid nodule. IMPRESSION: Technically successful ultrasound guided fine needle aspiration of nodule located in the inferior aspect of the left thyroid. Performed by Anders Grant NP Electronically Signed   By: Marliss Coots M.D.   On: 11/24/2023 11:18    Microbiology: Results for orders placed or performed during the hospital encounter of 10/03/20  SARS CORONAVIRUS 2 (Reginal Wojcicki 6-24 HRS) Nasopharyngeal Nasopharyngeal Swab     Status: None   Collection Time: 10/03/20  2:36 PM   Specimen: Nasopharyngeal Swab  Result Value Ref Range Status   SARS Coronavirus 2 NEGATIVE NEGATIVE Final    Comment: (NOTE) SARS-CoV-2 target nucleic acids are NOT DETECTED.  The SARS-CoV-2 RNA is generally detectable in upper and lower respiratory specimens during the acute phase of infection. Negative results do not preclude SARS-CoV-2 infection, do not rule out co-infections with other pathogens, and should not be used as the sole basis for treatment or other patient management decisions. Negative results must be combined with clinical observations, patient history, and epidemiological information. The expected result is Negative.  Fact Sheet for Patients: HairSlick.no  Fact Sheet for Healthcare  Providers: quierodirigir.com  This test is not yet approved or cleared by the [place] FDA and  has been authorized for detection and/or diagnosis of SARS-CoV-2 by FDA under an Emergency Use Authorization (EUA). This EUA will remain  in effect (meaning this test can be used) for the duration of the COVID-19 declaration under Se ction 564(b)(1) of the Act, 21 U.S.C. section 360bbb-3(b)(1), unless the authorization is terminated or revoked sooner.  Performed at [facility], [address], [place], [place] [address]     Labs: CBC: Recent Labs  Lab 12/13/23 [phone] 12/14/23 0416 12/15/23 0448  WBC 4.6 4.6 5.0  NEUTROABS 2.6  --   --   HGB 12.6 12.0 12.2  HCT 39.4 38.1 38.8  MCV 93.4 94.5 93.9  PLT 306 278 264   Basic Metabolic Panel: Recent Labs  Lab 12/13/23 0809 12/14/23 0416  NA 139 138  K 4.4 3.8  CL 106 104  CO2 23 25  GLUCOSE 105* 98  BUN 29* 28*  CREATININE 0.79 0.78  CALCIUM 9.5 8.8*   Liver Function Tests: Recent Labs  Lab 12/13/23 0809  AST 20  ALT 20  ALKPHOS 92  BILITOT 0.4  PROT 7.1  ALBUMIN 3.8   CBG: No results for input(s): "GLUCAP" in the last 168 hours.  Discharge time spent: greater than 30 minutes.  Signed: Catarina Hartshorn, MD [facility] 12/15/2023

## 2023-12-18 LAB — SURGICAL PATHOLOGY

## 2023-12-18 NOTE — Telephone Encounter (Signed)
 Discussed with patient and was advised to go to ED for further evaluation for severe abdominal pain, vomiting blood and rectal bleeding. She states she will go to [facility] ED.

## 2023-12-18 NOTE — Telephone Encounter (Signed)
 Pt is aware of her HOS FU with Dr [name] on 4/29 at 10am. She said that earlier today she started having severe abd pain, vomited blood and was passing blood rectally. Please advise. [phone]

## 2023-12-19 ENCOUNTER — Observation Stay (HOSPITAL_COMMUNITY)
Admission: EM | Admit: 2023-12-19 | Discharge: 2023-12-20 | Disposition: A | Discharge status: Home / Self Care | Attending: Family Medicine | Admitting: Family Medicine

## 2023-12-19 ENCOUNTER — Observation Stay (HOSPITAL_BASED_OUTPATIENT_CLINIC_OR_DEPARTMENT_OTHER): Admitting: Anesthesiology

## 2023-12-19 ENCOUNTER — Inpatient Hospital Stay: Admitting: Gastroenterology

## 2023-12-19 ENCOUNTER — Other Ambulatory Visit: Payer: Self-pay

## 2023-12-19 ENCOUNTER — Observation Stay (HOSPITAL_COMMUNITY): Admitting: Anesthesiology

## 2023-12-19 ENCOUNTER — Encounter (HOSPITAL_COMMUNITY)
Admission: EM | Disposition: A | Discharge status: Home / Self Care | Payer: Self-pay | Source: Home / Self Care | Attending: Emergency Medicine

## 2023-12-19 ENCOUNTER — Observation Stay (HOSPITAL_COMMUNITY)

## 2023-12-19 ENCOUNTER — Encounter (HOSPITAL_COMMUNITY): Payer: Self-pay | Admitting: Emergency Medicine

## 2023-12-19 DIAGNOSIS — Z79899 Other long term (current) drug therapy: Secondary | ICD-10-CM | POA: Insufficient documentation

## 2023-12-19 DIAGNOSIS — K449 Diaphragmatic hernia without obstruction or gangrene: Secondary | ICD-10-CM | POA: Insufficient documentation

## 2023-12-19 DIAGNOSIS — K922 Gastrointestinal hemorrhage, unspecified: Principal | ICD-10-CM | POA: Diagnosis present

## 2023-12-19 DIAGNOSIS — F419 Anxiety disorder, unspecified: Secondary | ICD-10-CM | POA: Insufficient documentation

## 2023-12-19 DIAGNOSIS — K921 Melena: Secondary | ICD-10-CM | POA: Diagnosis not present

## 2023-12-19 DIAGNOSIS — R1033 Periumbilical pain: Secondary | ICD-10-CM

## 2023-12-19 DIAGNOSIS — E042 Nontoxic multinodular goiter: Secondary | ICD-10-CM | POA: Insufficient documentation

## 2023-12-19 DIAGNOSIS — E669 Obesity, unspecified: Secondary | ICD-10-CM | POA: Insufficient documentation

## 2023-12-19 DIAGNOSIS — F32A Depression, unspecified: Secondary | ICD-10-CM | POA: Insufficient documentation

## 2023-12-19 DIAGNOSIS — K259 Gastric ulcer, unspecified as acute or chronic, without hemorrhage or perforation: Secondary | ICD-10-CM | POA: Diagnosis not present

## 2023-12-19 DIAGNOSIS — Z6830 Body mass index (BMI) 30.0-30.9, adult: Secondary | ICD-10-CM | POA: Insufficient documentation

## 2023-12-19 DIAGNOSIS — I10 Essential (primary) hypertension: Secondary | ICD-10-CM | POA: Diagnosis not present

## 2023-12-19 DIAGNOSIS — K250 Acute gastric ulcer with hemorrhage: Secondary | ICD-10-CM

## 2023-12-19 DIAGNOSIS — K254 Chronic or unspecified gastric ulcer with hemorrhage: Secondary | ICD-10-CM | POA: Diagnosis not present

## 2023-12-19 DIAGNOSIS — F418 Other specified anxiety disorders: Secondary | ICD-10-CM

## 2023-12-19 DIAGNOSIS — G894 Chronic pain syndrome: Secondary | ICD-10-CM | POA: Insufficient documentation

## 2023-12-19 DIAGNOSIS — K920 Hematemesis: Secondary | ICD-10-CM | POA: Diagnosis present

## 2023-12-19 HISTORY — PX: ESOPHAGOGASTRODUODENOSCOPY: SHX5428

## 2023-12-19 LAB — TYPE AND SCREEN
ABO/RH(D): A POS
Antibody Screen: NEGATIVE

## 2023-12-19 LAB — CBC
HCT: 34.4 % — ABNORMAL LOW (ref 36.0–46.0)
Hemoglobin: 11.2 g/dL — ABNORMAL LOW (ref 12.0–15.0)
MCH: 30.4 pg (ref 26.0–34.0)
MCHC: 32.6 g/dL (ref 30.0–36.0)
MCV: 93.2 fL (ref 80.0–100.0)
Platelets: 252 10*3/uL (ref 150–400)
RBC: 3.69 MIL/uL — ABNORMAL LOW (ref 3.87–5.11)
RDW: 14.6 % (ref 11.5–15.5)
WBC: 4.7 10*3/uL (ref 4.0–10.5)
nRBC: 0 % (ref 0.0–0.2)

## 2023-12-19 LAB — COMPREHENSIVE METABOLIC PANEL WITH GFR
ALT: 18 U/L (ref 0–44)
AST: 20 U/L (ref 15–41)
Albumin: 3.8 g/dL (ref 3.5–5.0)
Alkaline Phosphatase: 83 U/L (ref 38–126)
Anion gap: 10 (ref 5–15)
BUN: 31 mg/dL — ABNORMAL HIGH (ref 6–20)
CO2: 25 mmol/L (ref 22–32)
Calcium: 9.2 mg/dL (ref 8.9–10.3)
Chloride: 104 mmol/L (ref 98–111)
Creatinine, Ser: 0.83 mg/dL (ref 0.44–1.00)
GFR, Estimated: >60 mL/min (ref >60)
Glucose, Bld: 101 mg/dL — ABNORMAL HIGH (ref 70–99)
Potassium: 3.6 mmol/L (ref 3.5–5.1)
Sodium: 139 mmol/L (ref 135–145)
Total Bilirubin: 0.4 mg/dL (ref 0.0–1.2)
Total Protein: 6.8 g/dL (ref 6.5–8.1)

## 2023-12-19 LAB — PROTIME-INR
INR: 1 (ref 0.8–1.2)
Prothrombin Time: 13 s (ref 11.4–15.2)

## 2023-12-19 LAB — MAGNESIUM: Magnesium: 2.3 mg/dL (ref 1.7–2.4)

## 2023-12-19 LAB — CBG MONITORING, ED: Glucose-Capillary: 92 mg/dL (ref 70–99)

## 2023-12-19 LAB — PHOSPHORUS: Phosphorus: 4.2 mg/dL (ref 2.5–4.6)

## 2023-12-19 SURGERY — EGD (ESOPHAGOGASTRODUODENOSCOPY)
Anesthesia: General

## 2023-12-19 MED ORDER — SODIUM CHLORIDE 0.9 % IV SOLN: INTRAVENOUS | Status: AC

## 2023-12-19 MED ORDER — HYDRALAZINE HCL 20 MG/ML IJ SOLN: 10.0000 mg | INTRAMUSCULAR | Status: DC | PRN

## 2023-12-19 MED ORDER — PHENYLEPHRINE 80 MCG/ML (10ML) SYRINGE FOR IV PUSH (FOR BLOOD PRESSURE SUPPORT)
PREFILLED_SYRINGE | INTRAVENOUS | Status: DC | PRN
  Administered 2023-12-19 (×2): 80 ug via INTRAVENOUS

## 2023-12-19 MED ORDER — IOHEXOL 350 MG/ML SOLN
100.0000 mL | Freq: Once | INTRAVENOUS | Status: AC | PRN
  Administered 2023-12-19: 100 mL via INTRAVENOUS

## 2023-12-19 MED ORDER — HYDROMORPHONE HCL 1 MG/ML IJ SOLN
0.5000 mg | INTRAMUSCULAR | Status: DC | PRN
  Administered 2023-12-19 – 2023-12-20 (×7): 1 mg via INTRAVENOUS
  Filled 2023-12-19 (×7): qty 1

## 2023-12-19 MED ORDER — IPRATROPIUM BROMIDE 0.02 % IN SOLN: 0.5000 mg | Freq: Four times a day (QID) | RESPIRATORY_TRACT | Status: DC | PRN

## 2023-12-19 MED ORDER — SODIUM CHLORIDE (PF) 0.9 % IJ SOLN
PREFILLED_SYRINGE | INTRAMUSCULAR | Status: DC | PRN
  Administered 2023-12-19: 4 mL

## 2023-12-19 MED ORDER — GABAPENTIN 300 MG PO CAPS
600.0000 mg | ORAL_CAPSULE | Freq: Three times a day (TID) | ORAL | Status: DC
  Administered 2023-12-19 – 2023-12-20 (×4): 600 mg via ORAL
  Filled 2023-12-19 (×4): qty 2

## 2023-12-19 MED ORDER — SODIUM CHLORIDE 0.9% FLUSH
3.0000 mL | Freq: Two times a day (BID) | INTRAVENOUS | Status: DC
  Administered 2023-12-19 – 2023-12-20 (×2): 3 mL via INTRAVENOUS

## 2023-12-19 MED ORDER — SUCRALFATE 1 G PO TABS: 1.0000 g | ORAL_TABLET | Freq: Four times a day (QID) | ORAL | 0 refills | Status: DC

## 2023-12-19 MED ORDER — ORAL CARE MOUTH RINSE: 15.0000 mL | OROMUCOSAL | Status: DC | PRN

## 2023-12-19 MED ORDER — PANTOPRAZOLE SODIUM 40 MG PO TBEC: 40.0000 mg | DELAYED_RELEASE_TABLET | Freq: Two times a day (BID) | ORAL | 1 refills | Status: DC

## 2023-12-19 MED ORDER — ROSUVASTATIN CALCIUM 20 MG PO TABS
20.0000 mg | ORAL_TABLET | Freq: Every day | ORAL | Status: DC
  Administered 2023-12-20: 20 mg via ORAL
  Filled 2023-12-19: qty 1

## 2023-12-19 MED ORDER — SUCRALFATE 1 GM/10ML PO SUSP
1.0000 g | Freq: Three times a day (TID) | ORAL | Status: DC
  Administered 2023-12-19 – 2023-12-20 (×3): 1 g via ORAL
  Filled 2023-12-19 (×3): qty 10

## 2023-12-19 MED ORDER — SODIUM CHLORIDE 0.9 % IV SOLN: INTRAVENOUS | Status: DC

## 2023-12-19 MED ORDER — BUTALBITAL-APAP-CAFFEINE 50-325-40 MG PO TABS: 1.0000 | ORAL_TABLET | Freq: Every day | ORAL | Status: DC | PRN

## 2023-12-19 MED ORDER — PROPOFOL 10 MG/ML IV BOLUS
INTRAVENOUS | Status: DC | PRN
  Administered 2023-12-19: 100 mg via INTRAVENOUS

## 2023-12-19 MED ORDER — EPINEPHRINE 1 MG/10ML IJ SOSY
PREFILLED_SYRINGE | INTRAMUSCULAR | Status: AC
  Filled 2023-12-19: qty 10

## 2023-12-19 MED ORDER — OXYCODONE HCL 5 MG PO TABS
5.0000 mg | ORAL_TABLET | ORAL | Status: DC | PRN
  Administered 2023-12-20: 5 mg via ORAL
  Filled 2023-12-19: qty 1

## 2023-12-19 MED ORDER — VENLAFAXINE HCL ER 75 MG PO CP24
150.0000 mg | ORAL_CAPSULE | Freq: Every morning | ORAL | Status: DC
  Administered 2023-12-19 – 2023-12-20 (×2): 150 mg via ORAL
  Filled 2023-12-19 (×2): qty 2

## 2023-12-19 MED ORDER — LIDOCAINE HCL (PF) 2 % IJ SOLN
INTRAMUSCULAR | Status: DC | PRN
  Administered 2023-12-19: 100 mg via INTRADERMAL

## 2023-12-19 MED ORDER — FLEET ENEMA RE ENEM: 1.0000 | ENEMA | Freq: Once | RECTAL | Status: DC | PRN

## 2023-12-19 MED ORDER — SODIUM CHLORIDE 0.9 % IV BOLUS
1000.0000 mL | Freq: Once | INTRAVENOUS | Status: AC
  Administered 2023-12-19: 1000 mL via INTRAVENOUS

## 2023-12-19 MED ORDER — LEVALBUTEROL HCL 0.63 MG/3ML IN NEBU: 0.6300 mg | INHALATION_SOLUTION | Freq: Four times a day (QID) | RESPIRATORY_TRACT | Status: DC | PRN

## 2023-12-19 MED ORDER — ACETAMINOPHEN 325 MG PO TABS: 650.0000 mg | ORAL_TABLET | Freq: Four times a day (QID) | ORAL | Status: DC | PRN

## 2023-12-19 MED ORDER — BISACODYL 5 MG PO TBEC: 5.0000 mg | DELAYED_RELEASE_TABLET | Freq: Every day | ORAL | Status: DC | PRN

## 2023-12-19 MED ORDER — LACTATED RINGERS IV SOLN: INTRAVENOUS | Status: DC | PRN

## 2023-12-19 MED ORDER — ACETAMINOPHEN 650 MG RE SUPP: 650.0000 mg | Freq: Four times a day (QID) | RECTAL | Status: DC | PRN

## 2023-12-19 MED ORDER — PROPOFOL 500 MG/50ML IV EMUL
INTRAVENOUS | Status: DC | PRN
  Administered 2023-12-19: 150 ug/kg/min via INTRAVENOUS

## 2023-12-19 MED ORDER — PANTOPRAZOLE SODIUM 40 MG IV SOLR
40.0000 mg | Freq: Two times a day (BID) | INTRAVENOUS | Status: DC
  Administered 2023-12-19 – 2023-12-20 (×3): 40 mg via INTRAVENOUS
  Filled 2023-12-19 (×3): qty 10

## 2023-12-19 MED ORDER — PROCHLORPERAZINE EDISYLATE 10 MG/2ML IJ SOLN
10.0000 mg | Freq: Four times a day (QID) | INTRAMUSCULAR | Status: DC | PRN
  Administered 2023-12-19 (×2): 10 mg via INTRAVENOUS
  Filled 2023-12-19 (×2): qty 2

## 2023-12-19 MED ORDER — HYDROMORPHONE HCL 1 MG/ML IJ SOLN
1.0000 mg | Freq: Once | INTRAMUSCULAR | Status: AC
  Administered 2023-12-19: 1 mg via INTRAVENOUS
  Filled 2023-12-19: qty 1

## 2023-12-19 MED ORDER — POLYSACCHARIDE IRON COMPLEX 150 MG PO CAPS
150.0000 mg | ORAL_CAPSULE | Freq: Every day | ORAL | Status: DC
  Administered 2023-12-19 – 2023-12-20 (×2): 150 mg via ORAL
  Filled 2023-12-19 (×3): qty 1

## 2023-12-19 MED ORDER — TRAZODONE HCL 50 MG PO TABS: 25.0000 mg | ORAL_TABLET | Freq: Every evening | ORAL | Status: DC | PRN

## 2023-12-19 MED ORDER — PROCHLORPERAZINE EDISYLATE 10 MG/2ML IJ SOLN
10.0000 mg | Freq: Once | INTRAMUSCULAR | Status: AC
  Administered 2023-12-19: 10 mg via INTRAVENOUS
  Filled 2023-12-19: qty 2

## 2023-12-19 NOTE — ED Triage Notes (Signed)
 Pt c/o vomiting blood and rectal bleeding that started yesterday. Pt states it is bright red blood.

## 2023-12-19 NOTE — Transfer of Care (Signed)
 Immediate Anesthesia Transfer of Care Note  Patient: Sabrina [name]  Procedure(s) Performed: EGD (ESOPHAGOGASTRODUODENOSCOPY)  Patient Location: PACU  Anesthesia Type:General  Level of Consciousness: drowsy  Airway & Oxygen Therapy: Patient Spontanous Breathing  Post-op Assessment: Report given to RN and Post -op Vital signs reviewed and stable  Post vital signs: Reviewed and stable  Last Vitals:  Vitals Value Taken Time  BP 130/82 12/19/23 1252  Temp 97.5   Pulse 116 12/19/23 1253  Resp 25 12/19/23 1253  SpO2 97 % 12/19/23 1253  Vitals shown include unfiled device data.  Last Pain:  Vitals:   12/19/23 1223  TempSrc:   PainSc: 8       Patients Stated Pain Goal: 6 (12/19/23 0840)  Complications: No notable events documented.

## 2023-12-19 NOTE — Consult Note (Addendum)
 @LOGO @   Referring Provider: [facility]  Primary Care Physician:  Randel Pigg, Dorma Russell, MD Primary Gastroenterologist:  Dr. [name]  Date of Admission: 12/19/23 Date of Consultation: 12/19/23  Reason for Consultation:  Hematemesis and rectal bleeding  HPI:  Sabrina [name] is a 58 y.o. 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   ED course: Blood pressure soft (systolic fluctuating 90s to low 100s), mild intermittent tachycardia, otherwise vital signs stable. Labs remarkable for hemoglobin 11.2 (down from 12.2, 4 days ago), BUN elevated at 31.  She received Dilaudid, Compazine, and Protonix 40 mg IV in the ER.  Admitted to hospital service and GI consulted for evaluation.   Consult:  Monday went to work and noticed sharp pain across her entire abdomen radiating some to the right back. Had to run out of surgery and ended up vomiting bright red blood. Went back into surgery and left sick again and went to try to use the bathroom and she passed red blood per rectum.  She has never had rectal bleeding in the past.  Denies constipation or diarrhea.  2 total episodes of rectal bleeding.  4 total episodes of hematemesis but that last 2 episodes were just a little blood tinged. Last episode of emesis was yesterday afternoon sometime.   Has continues to have abdominal pain since discharge, but worse Monday. Had yogurt and few bites of banana Monday morning. Doesn't have much of an appetite. This started at the time of her last hospitalization. Abdominal pain can worsen postprandially but not always. Fried foods and spicy foods worsen symptoms. Currently abdominal pain was 8/10 but with dilaudid, pain improved to 5/10.   Was taking pantoprazole Bid.   No NSAIDs. Tylenol only prn.  Used to take ibuprofen frequently prior to 2023, but none since.    History of hysterectomy due to endometriosis.   No food today. 1-2 oz of ginger ale.     EGD 12/14/2023 with normal esophagus, gastritis biopsied, 13 bleeding angioectasias in the stomach s/p APC.  Many these were actively oozing and others with stigmata of recent bleed.  A single nonbleeding angiectasia in the duodenum s/p APC. A. STOMACH, BIOPSY:  Gastric antral mucosa with active gastritis.  Immunohistochemical stain for H. pylori negative.  Negative for intestinal metaplasia or dysplasia.    EGD 10/26/23: Grade A esophagitis, gastritis, 2cm HH.  A. SMALL BOWEL, BIOPSY:  Duodenal mucosa with preserved villoglandular architecture without  increased intraepithelial lymphocytes or evidence of active  inflammation.  No evidence of gluten sensitive enteropathy.   B. GASTRIC, BIOPSY:  Gastric antral mucosa with reactive epithelial changes.  Gastric oxyntic mucosa without significant diagnostic alteration.  No H. pylori identified on HE stain.  Negative for intestinal metaplasia or dysplasia.    Last colonoscopy: Reports in 2023 in [facility]. Normal colonoscopy, internal hemorrhoids.   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HCC)    Poisoning, snake bite  04/08/2016   "[place]; RUE"   PONV (postoperative nausea and vomiting)    S/P right knee surgery    Situational anxiety    Staph infection ~ 2015   "left elbow and finger"   Thyroid mass     Past Surgical History:  Procedure Laterality Date   APPENDECTOMY  ~ 1987   APPLICATION OF A-CELL OF EXTREMITY Left 08/05/2015    Procedure: APPLICATION OF A-CELL OF EXTREMITY;  Surgeon: Peggye Form, DO;  Location: [facility];  Service: Plastics;  Laterality: Left;   BIOPSY  10/26/2023   Procedure: BIOPSY;  Surgeon: [name], MD;  [facility];  Service: Endoscopy;;   BREAST SURGERY Right 1990   "milk duct taken out"   CHONDROPLASTY Right 02/19/2019   Procedure: CHONDROPLASTY; EXCISION EXOSTOSIS;  Surgeon: Valeria Batman, MD;  Location: [facility];  Service: Orthopedics;  Laterality: Right;   DEBRIDEMENT AND CLOSURE WOUND Left 07/01/2015   Procedure: LEFT ELBOW EXCISION OF WOUND WITH PRIMARY CLOSURE 2X5 CM ;  Surgeon: Peggye Form, DO;  Location: [facility];  Service: Plastics;  Laterality: Left;   ELBOW SURGERY Left X 23 in [place] <06/2015   from a cat bite; all I&D   ESOPHAGOGASTRODUODENOSCOPY N/A 12/14/2023   Procedure: EGD (ESOPHAGOGASTRODUODENOSCOPY);  Surgeon: [name], MD;  [facility];  Service: Endoscopy;  Laterality: N/A;   ESOPHAGOGASTRODUODENOSCOPY (EGD) WITH PROPOFOL N/A 10/26/2023   Procedure: ESOPHAGOGASTRODUODENOSCOPY (EGD) WITH PROPOFOL;  Surgeon: [name], MD;  [facility];  Service: Endoscopy;  Laterality: N/A;   HOT HEMOSTASIS  12/14/2023   Procedure: EGD, WITH ARGON PLASMA COAGULATION;  Surgeon: [name], MD;  [facility];  Service: Endoscopy;;   I & D EXTREMITY Left 07/08/2015   Procedure: IRRIGATION AND DEBRIDEMENT EXTREMITY, DRAINAGE OF LEFT ARM WOUND, A-CELL PLACEMENT, WOUND VAC PLACEMENT;  Surgeon: Alena Bills Dillingham, DO;  Location: [facility];  Service: Plastics;  Laterality: Left;   INCISION AND DRAINAGE OF WOUND Left 08/05/2015   Procedure: IRRIGATION AND DEBRIDEMENT LEFT ELBOW WOUND, PLACEMENT OF ACELL;  Surgeon: Peggye Form, DO;  Location: [facility];  Service: Plastics;  Laterality: Left;   KNEE ARTHROSCOPY WITH MEDIAL MENISECTOMY  Right 02/19/2019   Procedure: RIGHT KNEE ARTHROSCOPY, DEBRIDEMENT, PARTIAL MEDIAL AND LATERAL MENISECTOMY;  Surgeon: Valeria Batman, MD;  Location: [facility];  Service: Orthopedics;  Laterality: Right;   LAPAROSCOPIC CHOLECYSTECTOMY  1998   SKIN GRAFT Left 2016   took from anterior thigh; placed at elbow   TONSILLECTOMY  ~ 2000   TOTAL ABDOMINAL HYSTERECTOMY  2003   WRIST SURGERY Right 01/2016    Prior to Admission medications   Medication Sig Start Date End Date Taking? Authorizing Provider  butalbital-acetaminophen-caffeine (FIORICET) 50-325-40 MG tablet Take 1 tablet by mouth daily as needed. 11/19/23   [provider]  diazepam (VALIUM) 10 MG tablet 1 qam  2 qhs 12/12/23   Plovsky, Earvin Hansen, MD  FERREX 150 150 MG capsule Take 150 mg by mouth daily.    [provider]  gabapentin (NEURONTIN) 300 MG capsule Take 2 capsules (600 mg total) by mouth 3 (three) times daily. 08/10/21   Philip Aspen, Limmie Patricia, MD  oxyCODONE (OXY IR/ROXICODONE) 5 MG immediate release tablet Take 1 tablet (5 mg total) by mouth every 6 (six) hours as needed for moderate pain (pain score 4-6). 12/15/23   Catarina Hartshorn, MD  pantoprazole (PROTONIX)  40 MG tablet Take 1 tablet (40 mg total) by mouth 2 (two) times daily. 12/15/23   Catarina Hartshorn, MD  rosuvastatin (CRESTOR) 20 MG tablet Take 1 tablet by mouth daily. 06/08/22   [provider]  tiZANidine (ZANAFLEX) 4 MG tablet Take 4 mg by mouth at bedtime as needed for muscle spasms. 09/25/23   [provider]  venlafaxine XR (EFFEXOR-XR) 150 MG 24 hr capsule Take 1 capsule (150 mg total) by mouth every morning. 07/12/23 07/11/24  Archer Asa, MD    Current Facility-Administered Medications  Medication Dose Route Frequency Provider Last Rate Last Admin   0.9 %  sodium chloride infusion   Intravenous Continuous Nevin Bloodgood A, MD 100 mL/hr at 12/19/23 0759 New Bag at 12/19/23 0759   acetaminophen (TYLENOL) tablet 650 mg  650  mg Oral Q6H PRN Kendell Bane, MD       Or   acetaminophen (TYLENOL) suppository 650 mg  650 mg Rectal Q6H PRN Shahmehdi, Seyed A, MD       bisacodyl (DULCOLAX) EC tablet 5 mg  5 mg Oral Daily PRN Shahmehdi, Seyed A, MD       butalbital-acetaminophen-caffeine (FIORICET) 50-325-40 MG per tablet 1 tablet  1 tablet Oral Daily PRN Shahmehdi, Seyed A, MD       gabapentin (NEURONTIN) capsule 600 mg  600 mg Oral TID Nevin Bloodgood A, MD   600 mg at 12/19/23 2130   hydrALAZINE (APRESOLINE) injection 10 mg  10 mg Intravenous Q4H PRN Shahmehdi, Seyed A, MD       HYDROmorphone (DILAUDID) injection 0.5-1 mg  0.5-1 mg Intravenous Q2H PRN Nevin Bloodgood A, MD   1 mg at 12/19/23 0837   ipratropium (ATROVENT) nebulizer solution 0.5 mg  0.5 mg Nebulization Q6H PRN Shahmehdi, Seyed A, MD       iron polysaccharides (NIFEREX) capsule 150 mg  150 mg Oral Daily Shahmehdi, Seyed A, MD   150 mg at 12/19/23 0837   levalbuterol (XOPENEX) nebulizer solution 0.63 mg  0.63 mg Nebulization Q6H PRN Shahmehdi, Gemma Payor, MD       Oral care mouth rinse  15 mL Mouth Rinse PRN Adefeso, Oladapo, DO       oxyCODONE (Oxy IR/ROXICODONE) immediate release tablet 5 mg  5 mg Oral Q4H PRN Shahmehdi, Seyed A, MD       pantoprazole (PROTONIX) injection 40 mg  40 mg Intravenous Q12H Shahmehdi, Seyed A, MD   40 mg at 12/19/23 0837   [START ON 12/20/2023] rosuvastatin (CRESTOR) tablet 20 mg  20 mg Oral Daily Shahmehdi, Seyed A, MD       sodium chloride flush (NS) 0.9 % injection 3 mL  3 mL Intravenous Q12H Shahmehdi, Seyed A, MD   3 mL at 12/19/23 0919   sodium chloride flush (NS) 0.9 % injection 3 mL  3 mL Intravenous Q12H Shahmehdi, Seyed A, MD   3 mL at 12/19/23 0919   sodium phosphate (FLEET) enema 1 enema  1 enema Rectal Once PRN Shahmehdi, Seyed A, MD       traZODone (DESYREL) tablet 25 mg  25 mg Oral QHS PRN Shahmehdi, Seyed A, MD       venlafaxine XR (EFFEXOR-XR) 24 hr capsule 150 mg  150 mg Oral q morning Shahmehdi, Seyed A, MD    150 mg at 12/19/23 0837    Allergies as of 12/19/2023 - Review Complete 12/19/2023  Allergen Reaction Noted   Droperidol Palpitations and Other (See Comments) 07/01/2015   Ondansetron Hives,  Swelling, and Rash 03/09/2015   Benzonatate Rash and Other (See Comments) 09/21/2015   Bromfed dm [pseudoeph-bromphen-dm] Anxiety 09/17/2021   Diclofenac sodium Rash 08/14/2018   Flexeril [cyclobenzaprine] Anxiety 01/18/2017   Morphine and codeine Itching 11/09/2013    Family History  Problem Relation Age of Onset   Liver disease Mother    Dementia Mother    Cirrhosis Mother    Prostate cancer Father    Colon cancer Maternal Grandmother    Ovarian cancer Maternal Aunt     Social History   Socioeconomic History   Marital status: Divorced    Spouse name: Not on file   Number of children: 1   Years of education: Not on file   Highest education level: Associate degree: academic program  Occupational History   Occupation: unemployed  Tobacco Use   Smoking status: Never   Smokeless tobacco: Never  Vaping Use   Vaping status: Never Used  Substance and Sexual Activity   Alcohol use: Not Currently    Alcohol/week: 0.0 standard drinks of alcohol   Drug use: No   Sexual activity: Not on file  Other Topics Concern   Not on file  Social History Narrative   Pateint is right-handed. She lives alone in a single level home. She rarely drinks caffeine. She is limited to exercise due to knee injuries.   Social Drivers of Health   Financial Resource Strain: High Risk (06/05/2022)   [facility] to [facility]12[facility] prior to 11/12/2022.   Overall Financial Resource Strain (CARDIA)    Difficulty of Paying Living Expenses: Hard  Food Insecurity: Food Insecurity Present (12/19/2023)   Hunger Vital Sign    Worried About Running Out of Food in the Last Year: Sometimes true    Ran Out of Food  in the Last Year: Patient declined  Transportation Needs: No Transportation Needs (12/19/2023)   PRAPARE - Administrator, Civil Service (Medical): No    Lack of Transportation (Non-Medical): No  Physical Activity: Inactive (06/05/2022)   [facility] to [facility]/12[facility] visits prior to 11/12/2022.   Exercise Vital Sign    Days of Exercise per Week: 0 days    Minutes of Exercise per Session: 10 min  Stress: Stress Concern Present (06/05/2022)   [facility] prior to 11/12/2022., [facility] visits prior to 11/12/2022.   [employer] of Occupational Health - Occupational Stress Questionnaire    Feeling of Stress : Rather much  Social Connections: Moderately Integrated (12/19/2023)   Social Connection and Isolation Panel [NHANES]    Frequency of Communication with Friends and Family: Not on file    Frequency of Social Gatherings with Friends and Family: More than three times a week    Attends Religious Services: More than 4 times per year    Active Member of [employer] or Organizations: Yes    Attends Banker Meetings: More than 4 times per year    Marital Status: Divorced  Intimate Partner Violence: Patient Declined (12/19/2023)   Humiliation, Afraid, Rape, and Kick questionnaire    Fear of Current or Ex-Partner: Patient declined    Emotionally Abused: Patient declined    Physically Abused: Patient declined    Sexually Abused: Patient declined    Review  of Systems: Gen: Denies fever, chills, color for leg symptoms, syncope.  Admits to lightheadedness. CV: Denies chest pain, heart palpitations. Resp: Denies shortness of breath, cough. GI: See HPI GU : Denies urinary burning, urinary frequency, urinary incontinence.  MS: Denies joint pain. Derm: Denies rash. Psych: Denies  depression, anxiety. Heme: See HPI  Physical Exam: Vital signs in last 24 hours: Temp:  [98.3 F (36.8 C)-98.4 F (36.9 C)] 98.4 F (36.9 C) (04/08 0820) Pulse Rate:  [79-113] 88 (04/08 0820) Resp:  [16-18] 16 (04/08 0820) BP: (91-109)/(55-77) 103/77 (04/08 0820) SpO2:  [93 %-100 %] 99 % (04/08 0820) Weight:  [80 kg] 80 kg (04/08 0035) Last BM Date : 12/18/23 General:   Alert,  Well-developed, well-nourished, pleasant and cooperative in NAD.  Drowsy due to pain medications. Head:  Normocephalic and atraumatic. Eyes:  Sclera clear, no icterus.   Conjunctiva pink. Ears:  Normal auditory acuity. Nose:  No deformity, discharge,  or lesions. Lungs:  Clear throughout to auscultation.   No wheezes, crackles, or rhonchi. No acute distress. Heart:  Regular rate and rhythm; no murmurs, clicks, rubs,  or gallops. Abdomen:  Soft, and nondistended.  Mild generalized TTP.  No masses, hepatosplenomegaly or hernias noted. Normal bowel sounds, without guarding, and without rebound.   Rectal:  Deferred  Msk:  Symmetrical without gross deformities. Normal posture. Extremities:  Without edema.  SCDs in place. Neurologic:  Alert and  oriented x4;  grossly normal neurologically. Skin:  Intact without significant lesions or rashes. Psych:  Normal mood and affect.  Intake/Output from previous day: No intake/output data recorded. Intake/Output this shift: Total I/O In: 100 [P.O.:100] Out: 450 [Urine:450]  Lab Results: Recent Labs    12/19/23 0059  WBC 4.7  HGB 11.2*  HCT 34.4*  PLT 252   BMET Recent Labs    12/19/23 0059  NA 139  K 3.6  CL 104  CO2 25  GLUCOSE 101*  BUN 31*  CREATININE 0.83  CALCIUM 9.2   LFT Recent Labs    12/19/23 0059  PROT 6.8  ALBUMIN 3.8  AST 20  ALT 18  ALKPHOS 83  BILITOT 0.4    Impression: 58 y.o. 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   Hematemesis, rectal bleeding, gastric and duodenal angioectasias: Recurrent hematemesis also with new onset of bright red blood per rectum starting yesterday.  Total of 4 episodes of hematemesis and 2 episodes of hematochezia passing blood only per rectum.  No recurrent hematemesis or hematochezia today.  Hemoglobin declined to 11.2 from 12.2, 4 days ago.  BUN elevated at 31.  Last EGD 12/14/2023 with 13 bleeding angioectasias in the stomach s/p APC in 1 nonbleeding angiectasia in the duodenum s/p APC.   Recurrent hematemesis may be related to known angiectasia's versus development of ulceration related to recent APC.  Etiology of multiple angioectasias is not clear.  Echocardiogram without aortic stenosis. Denies NSAIDs. Reprots compliance with PPI BID outpatient.   Rectal bleeding could be rapid transit upper GI bleed, possibly dieulafoy lesion.  She does have colonoscopy on file from June 2023 with normal exam aside from internal hemorrhoids. Denies any chronic lower GI symptoms. However in the setting of generalized abdominal pain and new onset rectal bleeding, ischemic colitis is in the differential.  At this point, we will plan to proceed with an upper endoscopy today and consider repeating CT scan pending EGD findings.May need to consider updating colonoscopy.    Abdominal pain:  Reports generalized abdominal pain. Worse in the upper abdomen and feels that it radiates to her right flank, but also having lower abdominal pain which is different than her prior presentation.  CT A/P with contrast 10/26/2023 with no acute processes.  She does have hepatic cyst, tiny fat-containing umbilical hernia.  RUQ ultrasound on 4/3 with numerous hepatic cyst, some of which appear mildly complex by ultrasound though prior CT suggested simple cyst. Planning for  EGD today. Consider CT with contrast vs CT angio thereafter to re-evaluate abdominal pain. Will discuss with Dr. Levon Hedger.    Plan: NPO IV PPI BID Proceed with upper endoscopy by Dr. Levon Hedger today. The risks, benefits, and alternatives have been discussed with the patient in detail. The patient states understanding and desires to proceed.  Consider CT A/P with oral and IV contrast versus CT angio following EGD.  Will discuss with Dr. Nicanor Bake needed. Monitor for recurrent overt GI bleeding. Continue to monitor H&H and transfuse as necessary.    LOS: 0 days    12/19/2023, 9:48 AM   Ermalinda Memos, PA-C [facility] Gastroenterology

## 2023-12-19 NOTE — Anesthesia Procedure Notes (Signed)
 Date/Time: 12/19/2023 12:22 PM  Performed by: Julian Reil, CRNAPre-anesthesia Checklist: Emergency Drugs available, Patient identified, Suction available and Patient being monitored Patient Re-evaluated:Patient Re-evaluated prior to induction Oxygen Delivery Method: Nasal cannula Induction Type: IV induction Placement Confirmation: positive ETCO2 Comments: Optiflow High Flow [place] O2 used.

## 2023-12-19 NOTE — Anesthesia Preprocedure Evaluation (Signed)
 Anesthesia Evaluation  Patient identified by MRN, date of birth, ID band Patient awake    Reviewed: Allergy & Precautions, H&P , NPO status , Patient's Chart, lab work & pertinent test results, reviewed documented beta blocker date and time   History of Anesthesia Complications (+) PONV and history of anesthetic complications  Airway Mallampati: II  TM Distance: >3 FB Neck ROM: full    Dental no notable dental hx.    Pulmonary neg pulmonary ROS   Pulmonary exam normal breath sounds clear to auscultation       Cardiovascular Exercise Tolerance: Good hypertension, negative cardio ROS  Rhythm:regular Rate:Normal     Neuro/Psych  Headaches PSYCHIATRIC DISORDERS Anxiety Depression     Neuromuscular disease negative neurological ROS  negative psych ROS   GI/Hepatic negative GI ROS, Neg liver ROS,,,  Endo/Other  negative endocrine ROS    Renal/GU negative Renal ROS  negative genitourinary   Musculoskeletal   Abdominal   Peds  Hematology negative hematology ROS (+)   Anesthesia Other Findings   Reproductive/Obstetrics negative OB ROS                             Anesthesia Physical Anesthesia Plan  ASA: 3  Anesthesia Plan: General   Post-op Pain Management:    Induction:   PONV Risk Score and Plan: Propofol infusion  Airway Management Planned:   Additional Equipment:   Intra-op Plan:   Post-operative Plan:   Informed Consent: I have reviewed the patients History and Physical, chart, labs and discussed the procedure including the risks, benefits and alternatives for the proposed anesthesia with the patient or authorized representative who has indicated his/her understanding and acceptance.     Dental Advisory Given  Plan Discussed with: CRNA  Anesthesia Plan Comments:        Anesthesia Quick Evaluation

## 2023-12-19 NOTE — Brief Op Note (Signed)
 12/19/2023  12:58 PM  PATIENT:  Sabrina [name]  58 y.o. 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  Tolerated the procedure adequately.   FINDINGS: - 1 cm hiatal hernia.  - Eight cratered gastric ulcers were found, two with oozing hemorrhage (Forrest Class Ib) were found in the gastric body.  The largest lesion was 8 mm in largest dimension. 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 Edges of oozing ulcers were successfully injected with 4 mL of a 0.1 mg/mL solution of epinephrine for hemostasis.  For hemostasis, two hemostatic clips were successfully placed (onein each ulcer).  Clip manufacturer: [employer].  There was no bleeding at the end of the procedure.  - Normal examined duodenum.   RECOMMENDATIONS - Return patient to hospital ward for ongoing care.  - Clear liquid diet today.  - Proceed with CT angio abdomen/pelvis with IV contrast. - If recurrent rectal bleeding, will need to proceed with colonoscopy. - Use Protonix (pantoprazole) 40 mg PO BID.  - Use sucralfate suspension 1 gram PO QID for 1 month.   Katrinka Blazing, MD Gastroenterology and Hepatology [facility] Gastroenterology

## 2023-12-19 NOTE — ED Provider Notes (Signed)
  EMERGENCY DEPARTMENT AT [facility] Provider Note   CSN: [id number] Arrival date & time: 12/19/23  0014     History  Chief Complaint  Patient presents with   Emesis    Sabrina [name] is a 58 y.o. female.  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  She was admitted for the same in February.  Patient presenting with episodes of vomiting of blood starting earlier this evening.  She states that she is also passing bright red blood from her rectum.  The rectal bleeding is new.  She denies any fevers or chills.  No shortness of breath or lightheadedness.       Home Medications Prior to Admission medications   Medication Sig Start Date End Date Taking? Authorizing Provider  butalbital-acetaminophen-caffeine (FIORICET) 50-325-40 MG tablet Take 1 tablet by mouth daily as needed. 11/19/23   [provider]  diazepam (VALIUM) 10 MG tablet 1 qam  2 qhs 12/12/23   Plovsky, Earvin Hansen, MD  FERREX 150 150 MG capsule Take 150 mg by mouth daily.    [provider]  gabapentin (NEURONTIN) 300 MG capsule Take 2 capsules (600 mg total) by mouth 3 (three) times daily. 08/10/21   Philip Aspen, Limmie Patricia, MD  oxyCODONE (OXY IR/ROXICODONE) 5 MG immediate release tablet Take 1 tablet (5 mg total) by mouth every 6 (six) hours as needed for moderate pain (pain score 4-6). 12/15/23   Catarina Hartshorn, MD  pantoprazole (PROTONIX) 40 MG tablet Take 1 tablet (40 mg total) by mouth 2 (two) times daily. 12/15/23   Catarina Hartshorn, MD  rosuvastatin (CRESTOR) 20 MG tablet Take 1 tablet by mouth daily. 06/08/22   [provider]  tiZANidine (ZANAFLEX) 4 MG tablet Take 4 mg by mouth at bedtime as needed for muscle spasms. 09/25/23   [provider]  venlafaxine XR (EFFEXOR-XR) 150 MG 24 hr capsule Take 1 capsule (150 mg total) by mouth every morning. 07/12/23 07/11/24  Plovsky, Earvin Hansen, MD      Allergies     Droperidol, Ondansetron, Benzonatate, Bromfed dm [pseudoeph-bromphen-dm], Diclofenac sodium, Flexeril [cyclobenzaprine], and Morphine and codeine    Review of Systems   Review of Systems  All other systems reviewed and are negative.   Physical Exam Updated Vital Signs BP 109/77   Pulse (!) 105   Temp 98.4 F (36.9 C) (Oral)   Resp 18   Ht 5\' 4"  (1.626 m)   Wt 80 kg   SpO2 99%   BMI 30.27 kg/m  Physical Exam Vitals and nursing note reviewed.  Constitutional:      General: She is not in acute distress.    Appearance: She is well-developed. She is not diaphoretic.  HENT:     Head: Normocephalic and atraumatic.  Cardiovascular:     Rate and Rhythm: Normal rate and regular rhythm.     Heart sounds: No murmur heard.    No friction rub. No gallop.  Pulmonary:     Effort: Pulmonary effort is normal. No respiratory distress.     Breath sounds: Normal breath sounds. No wheezing.  Abdominal:     General: Bowel sounds are normal. There is no distension.     Palpations: Abdomen is soft.     Tenderness: There is abdominal tenderness. There is no guarding or rebound.     Comments: There is mild generalized abdominal tenderness  Musculoskeletal:        General: Normal range of  motion.     Cervical back: Normal range of motion and neck supple.  Skin:    General: Skin is warm and dry.  Neurological:     General: No focal deficit present.     Mental Status: She is alert and oriented to person, place, and time.     ED Results / Procedures / Treatments   Labs (all labs ordered are listed, but only abnormal results are displayed) Labs Reviewed  COMPREHENSIVE METABOLIC PANEL WITH GFR - Abnormal; Notable for the following components:      Result Value   Glucose, Bld 101 (*)    BUN 31 (*)    All other components within normal limits  CBC - Abnormal; Notable for the following components:   RBC 3.69 (*)    Hemoglobin 11.2 (*)    HCT 34.4 (*)    All other components within normal  limits  POC OCCULT BLOOD, ED  TYPE AND SCREEN    EKG None  Radiology No results found.  Procedures Procedures    Medications Ordered in ED Medications  sodium chloride 0.9 % bolus 1,000 mL (has no administration in time range)  prochlorperazine (COMPAZINE) injection 10 mg (has no administration in time range)  HYDROmorphone (DILAUDID) injection 1 mg (has no administration in time range)    ED Course/ Medical Decision Making/ A&P  Patient is a 58 year old female with history of duodenal telangiectasias and recurrent GI bleeds presenting with hematemesis and hematochezia.  Patient arrives here with stable vital signs and is afebrile.  She has mild tenderness to the epigastric region, but no peritoneal signs.  Laboratory studies obtained including CBC, CMP, both of which are basically unremarkable.  Her hemoglobin is 11.2 which is down from 12.1 from last month.  Patient to be admitted to the hospitalist service for further monitoring of her GI bleed.  Final Clinical Impression(s) / ED Diagnoses Final diagnoses:  None    Rx / DC Orders ED Discharge Orders     None         Geoffery Lyons, MD 12/19/23 [phone]

## 2023-12-19 NOTE — H&P (Addendum)
 History and Physical   Patient: Sabrina [name]                            PCP: Randel Pigg, Dorma Russell, MD                    DOB: 01-Jan-1966            DOA: 12/19/2023 ZOX:[id number]             DOS: 12/19/2023, 1:48 PM  Randel Pigg, Dorma Russell, MD  Patient coming from:   HOME  I have personally reviewed patient's medical records, in electronic medical records, including:  [facility], and care everywhere.    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  ED evaluation/course: Blood pressure (!) 93/55, pulse 86, temperature 98.3 F (36.8 C), resp. rate 18, height 5\' 4"  (1.626 m), weight 80 kg, SpO2 97%.  Labs: WBC 4.7, hemoglobin 11.2, BUN 31, glucose 101 otherwise within normal limits     Assessment and Plan:  Acute on chronic GI bleed hematemesis with rectal bleed  -Admitted for further evaluation of GI bleed - NPO, IVY, w IV Protonix -Baseline hemoglobin around 12 -Globin stable, on discharge for /44/25 hemoglobin was 12.2 today 11.2  -GI consult appreciated -IV Protonix 40 mg twice daily  -Patient does take phentermine daily -12/14/23--EGD--13 angioectasias in stomach s/p APC; single nonbleeding angioectasia in second portion duodenum; normal esophagus  -12/14/23 RUQ US--Prior cholecystectomy. No ductal dilatation. Numerous  hepatic cysts    12/19/2023 EGD under propofol sedation. FINDINGS: - 1 cm hiatal hernia.  - Eight cratered gastric ulcers were found, two with oozing hemorrhage (Forrest Class Ib) were found in the gastric body. The largest lesion was 8 mm in largest dimension. 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 Edges of oozing ulcers were successfully injected with 4 mL of a 0.1 mg/mL solution of epinephrine for hemostasis. For hemostasis, two hemostatic clips were successfully placed (onein each ulcer). Clip manufacturer: [employer]. There was no bleeding at the end of the procedure.  - Normal examined duodenum.   RECOMMENDATIONS - Clear liquid diet today.  - Proceed with CT angio abdomen/pelvis with IV contrast. - If recurrent rectal bleeding, will need to proceed with colonoscopy. - Use Protonix (pantoprazole) 40 mg PO BID.  - Use sucralfate suspension 1 gram PO QID for 1 month.        Depression/anxiety -Continue home dose diazepam and venlafaxine -PDMP reviewed--denies, 10 mg, last refill 12/08/2023   Chronic pain syndrome -Continue gabapentin   Multinodular goiter -11/24/2023 thyroid biopsy--benign follicular nodule   History of bullous pemphigoid -Now off of any immunomodulators and DMA RD's-outpatient follow-up with her dermatologist at [facility] as above for further management   Obesity Body mass index is 30.27 kg/m.  -lifestyle modfication    Patient Denies having: Fever, Chills, Cough, SOB, Chest Pain, Abd pain, N/V/D,  headache, dizziness, lightheadedness,  Dysuria, Joint pain, rash, open wounds    Review of Systems: As per HPI, otherwise 10 point review of systems were negative.   ----------------------------------------------------------------------------------------------------------------------  Allergies  Allergen Reactions   Droperidol Palpitations and Other (See Comments)    Elevates BP increases HR   Ondansetron Hives, Swelling and Rash     Spots around IV site IV Zofran   Benzonatate Rash and Other (See Comments)    Caused burning pain in eyes  Watery eyes  Vision changes  Conjunctivitis   Bromfed Dm [Pseudoeph-Bromphen-Dm] Anxiety   Diclofenac Sodium Rash   Flexeril [Cyclobenzaprine] Anxiety   Morphine And Codeine Itching    Pt reports she has had Morphine since the reaction and she didn't have a problem with it.     Home MEDs:  Prior to Admission medications   Medication Sig Start Date End Date Taking? Authorizing Provider  butalbital-acetaminophen-caffeine (FIORICET) 50-325-40 MG tablet Take 1 tablet by mouth daily as needed. 11/19/23   [provider]  diazepam (VALIUM) 10 MG tablet 1 qam  2 qhs 12/12/23   Plovsky, Earvin Hansen, MD  FERREX 150 150 MG capsule Take 150 mg by mouth daily.    [provider]  gabapentin (NEURONTIN) 300 MG capsule Take 2 capsules (600 mg total) by mouth 3 (three) times daily. 08/10/21   Philip Aspen, Limmie Patricia, MD  oxyCODONE (OXY IR/ROXICODONE) 5 MG immediate release tablet Take 1 tablet (5 mg total) by mouth every 6 (six) hours as needed for moderate pain (pain score 4-6). 12/15/23   Catarina Hartshorn, MD  pantoprazole (PROTONIX) 40 MG tablet Take 1 tablet (40 mg total) by mouth 2 (two) times daily. 12/15/23   Catarina Hartshorn, MD  rosuvastatin (CRESTOR) 20 MG tablet Take 1 tablet by mouth daily. 06/08/22   [provider]  tiZANidine (ZANAFLEX) 4 MG tablet Take 4 mg by mouth at bedtime as needed for muscle spasms. 09/25/23   [provider]  venlafaxine XR (EFFEXOR-XR) 150 MG 24 hr capsule Take 1 capsule (150 mg total) by mouth every morning. 07/12/23 07/11/24  Archer Asa, MD    PRN MEDs: acetaminophen **OR** acetaminophen, bisacodyl, butalbital-acetaminophen-caffeine, hydrALAZINE, HYDROmorphone (DILAUDID) injection, ipratropium, levalbuterol, mouth rinse, oxyCODONE, prochlorperazine, sodium phosphate, traZODone  Past Medical History:  Diagnosis Date   Arthritis     "knees" (09/06/2017)   Bullous pemphigus    Cat bite 06/2014   to left elbow   History of blood transfusion 1988   "when I had my baby"   Muscle weakness of lower extremity 2001; 09/05/2017   "resolved after a couple weeks; ?" (09/06/2017)   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HCC)    Poisoning, snake bite 04/08/2016   "[place]; RUE"   PONV (postoperative nausea and vomiting)    S/P right knee surgery    Situational anxiety    Staph infection ~ 2015   "left elbow and finger"   Thyroid mass     Past Surgical History:  Procedure Laterality Date   APPENDECTOMY  ~ 1987   APPLICATION OF A-CELL OF EXTREMITY Left 08/05/2015   Procedure: APPLICATION OF A-CELL OF EXTREMITY;  Surgeon: Peggye Form, DO;  Location: [facility];  Service: Plastics;  Laterality: Left;   BIOPSY  10/26/2023   Procedure: BIOPSY;  Surgeon: [name], MD;  [facility];  Service: Endoscopy;;   BREAST SURGERY Right 1990   "milk duct taken out"   CHONDROPLASTY Right 02/19/2019   Procedure: CHONDROPLASTY; EXCISION EXOSTOSIS;  Surgeon: Valeria Batman, MD;  Location: [facility];  Service: Orthopedics;  Laterality: Right;   DEBRIDEMENT AND CLOSURE WOUND Left 07/01/2015   Procedure: LEFT ELBOW EXCISION OF WOUND WITH PRIMARY CLOSURE 2X5 CM ;  Surgeon: Peggye Form, DO;  Location: [facility];  Service: Plastics;  Laterality: Left;   ELBOW SURGERY Left X 23 in [place] <06/2015   from a cat bite; all I&D   ESOPHAGOGASTRODUODENOSCOPY N/A 12/14/2023   Procedure: EGD (ESOPHAGOGASTRODUODENOSCOPY);  Surgeon: [name], MD;  [facility];  Service: Endoscopy;  Laterality: N/A;   ESOPHAGOGASTRODUODENOSCOPY (EGD) WITH PROPOFOL N/A 10/26/2023   Procedure: ESOPHAGOGASTRODUODENOSCOPY (EGD) WITH PROPOFOL;  Surgeon: [name], MD;  [facility];  Service: Endoscopy;  Laterality: N/A;   HOT HEMOSTASIS  12/14/2023   Procedure: EGD, WITH  ARGON PLASMA COAGULATION;  Surgeon: [name], MD;  [facility];  Service: Endoscopy;;   I & D EXTREMITY Left 07/08/2015   Procedure: IRRIGATION AND DEBRIDEMENT EXTREMITY, DRAINAGE OF LEFT ARM WOUND, A-CELL PLACEMENT, WOUND VAC PLACEMENT;  Surgeon: Alena Bills Dillingham, DO;  Location: [facility];  Service: Plastics;  Laterality: Left;   INCISION AND DRAINAGE OF WOUND Left 08/05/2015   Procedure: IRRIGATION AND DEBRIDEMENT LEFT ELBOW WOUND, PLACEMENT OF ACELL;  Surgeon: Peggye Form, DO;  Location: [facility];  Service: Plastics;  Laterality: Left;   KNEE ARTHROSCOPY WITH MEDIAL MENISECTOMY Right 02/19/2019   Procedure: RIGHT KNEE ARTHROSCOPY, DEBRIDEMENT, PARTIAL MEDIAL AND LATERAL MENISECTOMY;  Surgeon: Valeria Batman, MD;  Location: [facility];  Service: Orthopedics;  Laterality: Right;   LAPAROSCOPIC CHOLECYSTECTOMY  1998   SKIN GRAFT Left 2016   took from anterior thigh; placed at elbow   TONSILLECTOMY  ~ 2000   TOTAL ABDOMINAL HYSTERECTOMY  2003   WRIST SURGERY Right 01/2016     reports that she has never smoked. She has never used smokeless tobacco. She reports that she does not currently use alcohol. She reports that she does not use drugs.   Family History  Problem Relation Age of Onset   Liver disease Mother    Dementia Mother    Cirrhosis Mother    Prostate cancer Father    Colon cancer Maternal Grandmother    Ovarian cancer Maternal Aunt     Physical Exam:   Vitals:   12/19/23 1252 12/19/23 1300 12/19/23 1306 12/19/23 1326  BP: 130/82 132/86 138/76 108/72  Pulse: (!) 116 (!) 109 (!) 108 (!) 103  Resp: (!) 26 (!) 21 (!) 24 20  Temp: (!) 97.5 F (36.4 C)  97.6 F (36.4 C) 97.6 F (36.4 C)  TempSrc:    Oral  SpO2: 96% 99% 99% 100%  Weight:      Height:       Constitutional: NAD, calm, comfortable Eyes: PERRL, lids and conjunctivae normal ENMT: Mucous membranes are moist. Posterior pharynx clear of any exudate  or lesions.Normal dentition.  Neck: normal, supple, no masses, no thyromegaly Respiratory: clear to auscultation bilaterally, no wheezing, no crackles. Normal respiratory effort. No accessory muscle use.  Cardiovascular: Regular rate and rhythm, no murmurs / rubs / gallops. No extremity edema. 2+ pedal pulses. No carotid bruits.  Abdomen: no tenderness, no masses palpated. No hepatosplenomegaly. Bowel sounds positive.  Musculoskeletal: no clubbing / cyanosis. No joint deformity upper and lower extremities. Good ROM, no contractures. Normal muscle tone.  Neurologic: CN II-XII grossly intact. Sensation intact, DTR normal. Strength 5/5  in all 4.  Psychiatric: Normal judgment and insight. Alert and oriented x 3. Normal mood.  Skin: no rashes, lesions, ulcers. No induration Decubitus/ulcers:  Wounds: per nursing documentation         Labs on admission:    I have personally reviewed following labs and imaging studies  CBC: Recent Labs  Lab 12/13/23 0733 12/14/23 0416 12/15/23 0448 12/19/23 0059  WBC 4.6 4.6 5.0 4.7  NEUTROABS 2.6  --   --   --   HGB 12.6 12.0 12.2 11.2*  HCT 39.4 38.1 38.8 34.4*  MCV 93.4 94.5 93.9 93.2  PLT 306 278 264 252   Basic Metabolic Panel: Recent Labs  Lab 12/13/23 0809 12/14/23 0416 12/19/23 0059  NA 139 138 139  K 4.4 3.8 3.6  CL 106 104 104  CO2 23 25 25   GLUCOSE 105* 98 101*  BUN 29* 28* 31*  CREATININE 0.79 0.78 0.83  CALCIUM 9.5 8.8* 9.2  MG  --   --  2.3  PHOS  --   --  4.2   GFR: Estimated Creatinine Clearance: 78.3 mL/min (by C-G formula based on SCr of 0.83 mg/dL). Liver Function Tests: Recent Labs  Lab 12/13/23 0809 12/19/23 0059  AST 20 20  ALT 20 18  ALKPHOS 92 83  BILITOT 0.4 0.4  PROT 7.1 6.8  ALBUMIN 3.8 3.8   Recent Labs  Lab 12/13/23 0809  LIPASE 39   No results for input(s): "AMMONIA" in the last 168 hours. Coagulation Profile: Recent Labs  Lab 12/19/23 0059  INR 1.0   Cardiac Enzymes: No results  for input(s): "CKTOTAL", "CKMB", "CKMBINDEX", "TROPONINI" in the last 168 hours. BNP (last 3 results) No results for input(s): "PROBNP" in the last 8760 hours. HbA1C: No results for input(s): "HGBA1C" in the last 72 hours. CBG: Recent Labs  Lab 12/19/23 0741  GLUCAP 92   Lipid Profile: No results for input(s): "CHOL", "HDL", "LDLCALC", "TRIG", "CHOLHDL", "LDLDIRECT" in the last 72 hours. Thyroid Function Tests: No results for input(s): "TSH", "T4TOTAL", "FREET4", "T3FREE", "THYROIDAB" in the last 72 hours. Anemia Panel: No results for input(s): "VITAMINB12", "FOLATE", "FERRITIN", "TIBC", "IRON", "RETICCTPCT" in the last 72 hours. Urine analysis:    Component Value Date/Time   COLORURINE COLORLESS (A) 10/26/2023 0802   APPEARANCEUR HAZY (A) 10/26/2023 0802   LABSPEC 1.009 10/26/2023 0802   PHURINE 6.0 10/26/2023 0802   GLUCOSEU NEGATIVE 10/26/2023 0802   HGBUR NEGATIVE 10/26/2023 0802   BILIRUBINUR NEGATIVE 10/26/2023 0802   KETONESUR NEGATIVE 10/26/2023 0802   PROTEINUR NEGATIVE 10/26/2023 0802   UROBILINOGEN 0.2 07/05/2015 0627   NITRITE NEGATIVE 10/26/2023 0802   LEUKOCYTESUR NEGATIVE 10/26/2023 0802    Last A1C:  Lab Results  Component Value Date   HGBA1C 5.4 03/20/2020     Radiologic Exams on Admission:   No results found.  EKG:   Independently reviewed.  Orders placed or performed during the hospital encounter of 12/19/23   EKG 12-Lead   ---------------------------------------------------------------------------------------------------------------------------------------    Assessment / Plan:   Principal Problem:   GI bleed   Assessment and Plan: No notes have been filed under this hospital service. Service: Hospitalist             Consults called: Neurologist -------------------------------------------------------------------------------------------------------------------------------------------- DVT prophylaxis:  Place TED hose  Start: 12/19/23 0727 TED hose Start: 12/19/23 0726 SCDs Start: 12/19/23 0726   Code Status:   Code Status: Full Code   Admission status: Patient will be admitted as Observation, with a greater than 2  midnight length of stay. Level of care: Med-Surg   Family Communication:  none at bedside  (The above findings and plan of care has been discussed with patient in detail, the patient expressed understanding and agreement of above plan)  --------------------------------------------------------------------------------------------------------------------------------------------------  Disposition Plan:  Anticipated 1-2 days Status is: Observation The patient remains OBS appropriate and will d/c before 2 midnights.     ----------------------------------------------------------------------------------------------------------------------------------------------------  Time spent:  26  Min.  Was spent seeing and evaluating the patient, reviewing all medical records, drawn plan of care.  SIGNED: Kendell Bane, MD, FHM. FAAFP. [facility], Pager  (Please use [email or web address] to page/ or secure chat through epic) If 7PM-7AM, please contact night-coverage [email or web address],  12/19/2023, 1:48 PM

## 2023-12-19 NOTE — Plan of Care (Signed)
  Problem: Education: Goal: Knowledge of General Education information will improve Description: Including pain rating scale, medication(s)/side effects and non-pharmacologic comfort measures Outcome: Progressing   Problem: Health Behavior/Discharge Planning: Goal: Ability to manage health-related needs will improve Outcome: Progressing   Problem: Nutrition: Goal: Adequate nutrition will be maintained Outcome: Progressing   Problem: Activity: Goal: Risk for activity intolerance will decrease Outcome: Not Progressing   Problem: Coping: Goal: Level of anxiety will decrease Outcome: Not Progressing

## 2023-12-19 NOTE — TOC CM/SW Note (Signed)
 Transition of Care [facility]) - Inpatient Brief Assessment   Patient Details  Name: Sabrina [name] MRN: [id number] Date of Birth: 08/12/1966  Transition of Care [facility]) CM/SW Contact:    Beather Arbour Phone Number: 12/19/2023, 7:29 AM   Clinical Narrative:  TOC was consulted for PCP. Patient has a PCP already.   Transition of Care Department [facility]) has reviewed patient and no TOC needs have been identified at this time. We will continue to monitor patient advancement through interdisciplinary progression rounds. If new patient transition needs arise, please place a TOC consult.  Transition of Care Asessment: Insurance and Status: Insurance coverage has been reviewed Patient has primary care physician: Yes Home environment has been reviewed: Single Family Home Prior level of function:: Independent Prior/Current Home Services: No current home services Social Drivers of Health Review: SDOH reviewed no interventions necessary Readmission risk has been reviewed: Yes Transition of care needs: no transition of care needs at this time

## 2023-12-19 NOTE — Op Note (Signed)
 [facility] Patient Name: Sabrina [name] Procedure Date: 12/19/2023 12:09 PM MRN: [id number] Date of Birth: [birth year] Attending MD: Katrinka Blazing , , [id number] CSN: [id number] Age: 58 Admit Type: Inpatient Procedure:                Upper GI endoscopy Indications:              Hematemesis, Hematochezia Providers:                Katrinka Blazing, Angelica Ran, Dyann Ruddle, [place]                            Wilson Referring MD:              Medicines:                Monitored Anesthesia Care Complications:            No immediate complications. Estimated Blood Loss:     Estimated blood loss: none. Procedure:                Pre-Anesthesia Assessment:                           - Prior to the procedure, a History and Physical                            was performed, and patient medications, allergies                            and sensitivities were reviewed. The patient's                            tolerance of previous anesthesia was reviewed.                           - The risks and benefits of the procedure and the                            sedation options and risks were discussed with the                            patient. All questions were answered and informed                            consent was obtained.                           - ASA Grade Assessment: III - A patient with severe                            systemic disease.                           After obtaining informed consent, the endoscope was                            passed under direct vision. Throughout the  procedure, the patient's blood pressure, pulse, and                            oxygen saturations were monitored continuously. The                            [id number]-[id number] ([id number]) scope was introduced through the                            mouth, and advanced to the third part of duodenum.                            The upper GI endoscopy was accomplished without                             difficulty. The patient tolerated the procedure                            well. Scope In: 12:29:12 PM Scope Out: 12:47:45 PM Total Procedure Duration: 0 hours 18 minutes 33 seconds  Findings:      A 1 cm hiatal hernia was present.      Eight cratered gastric ulcers were found, two with oozing hemorrhage       (Forrest Class Ib) were found in the gastric body. The largest lesion       was 8 mm in largest dimension. 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 Edges of oozing ulcers were successfully injected with 4 mL       of a 0.1 mg/mL solution of epinephrine for hemostasis. For hemostasis,       two hemostatic clips were successfully placed (onein each ulcer). Clip       manufacturer: [employer]. There was no bleeding at the end of the       procedure.      The examined duodenum was normal. Impression:               - 1 cm hiatal hernia.                           - Oozing gastric ulcers with oozing hemorrhage                            (Forrest Class Ib). Injected. Clips were placed.                            Clip manufacturer: [employer].                           - Normal examined duodenum.                           - No specimens collected.                           Discussed findings with Ms. Kerrin Champagne (authorized  family member) Moderate Sedation:      Per Anesthesia Care Recommendation:           - Return patient to hospital ward for ongoing care.                           - Clear liquid diet today.                           - Proceed with CT angio abdomen/pelvis with IV                            contrast.                           - If recurrent rectal bleeding, will need to                            proceed with colonoscopy.                           - Use Protonix (pantoprazole) 40 mg PO BID.                           - Use sucralfate suspension 1 gram PO QID for 1                             month. Procedure Code(s):        --- Professional ---                           [phone], Esophagogastroduodenoscopy, flexible,                            transoral; with control of bleeding, any method Diagnosis Code(s):        --- Professional ---                           K44.9, Diaphragmatic hernia without obstruction or                            gangrene                           K25.4, Chronic or unspecified gastric ulcer with                            hemorrhage                           K92.0, Hematemesis                           K92.1, Melena (includes Hematochezia) CPT copyright 2022 [employer]. All rights reserved. The codes documented in this report are preliminary and upon coder review may  be revised to meet current compliance requirements. Katrinka Blazing, MD Katrinka Blazing,  12/19/2023 12:59:27 PM This report has been signed electronically. Number  of Addenda: 0

## 2023-12-19 NOTE — Hospital Course (Addendum)
 Sabrina [name] is a 58 year old female with a past medical history of GI bleed, anxiety, depression, hyperlipidemia, GERD, bullous pemphigoid-on prior treatment with methotrexate/steroid, multinodular goiter, chronic repair, repair 2020, presenting with hematemesis and rectal bleed.  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  ED evaluation/course: Blood pressure (!) 93/55, pulse 86, temperature 98.3 F (36.8 C), resp. rate 18, height 5\' 4"  (1.626 m), weight 80 kg, SpO2 97%.  Labs: WBC 4.7, hemoglobin 11.2, BUN 31, glucose 101 otherwise within normal limits     Assessment and Plan:  Acute on chronic GI bleed hematemesis with rectal bleed  -Admitted for further evaluation of GI bleed - NPO, IVY, w IV Protonix -Baseline hemoglobin around 12 -Globin stable, on discharge for /44/25 hemoglobin was 12.2 today 11.2  -GI consult appreciated -IV Protonix 40 mg twice daily  -Patient does take phentermine daily -12/14/23--EGD--13 angioectasias in stomach s/p APC; single nonbleeding angioectasia in second portion duodenum; normal esophagus  -12/14/23 RUQ US--Prior cholecystectomy. No ductal dilatation. Numerous hepatic cysts    12/19/2023 EGD under propofol sedation. FINDINGS: - 1 cm hiatal hernia.  - Eight cratered gastric ulcers were found, two with oozing hemorrhage (Forrest Class Ib) were found in the gastric body. The largest lesion was 8 mm in largest dimension. 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 Edges of oozing ulcers were successfully injected with 4 mL of a 0.1 mg/mL solution of epinephrine for hemostasis. For hemostasis, two hemostatic clips were successfully placed (onein each ulcer). Clip manufacturer:  [employer]. There was no bleeding at the end of the procedure.  - Normal examined duodenum.   RECOMMENDATIONS - Clear liquid diet today.  - Proceed with CT angio abdomen/pelvis with IV contrast. - If recurrent rectal bleeding, will need to proceed with colonoscopy. - Use Protonix (pantoprazole) 40 mg PO BID.  - Use sucralfate suspension 1 gram PO QID for 1 month.        Depression/anxiety -Continue home dose diazepam and venlafaxine -PDMP reviewed--denies, 10 mg, last refill 12/08/2023   Chronic pain syndrome -Continue gabapentin   Multinodular goiter -11/24/2023 thyroid biopsy--benign follicular nodule   History of bullous pemphigoid -Now off of any immunomodulators and DMA RD's-outpatient follow-up with her dermatologist at [facility] as above for further management   Obesity Body mass index is 30.27 kg/m.  -lifestyle modfication

## 2023-12-20 ENCOUNTER — Encounter (HOSPITAL_COMMUNITY): Payer: Self-pay | Admitting: Gastroenterology

## 2023-12-20 DIAGNOSIS — R1033 Periumbilical pain: Secondary | ICD-10-CM

## 2023-12-20 DIAGNOSIS — Z8719 Personal history of other diseases of the digestive system: Secondary | ICD-10-CM | POA: Diagnosis not present

## 2023-12-20 DIAGNOSIS — K254 Chronic or unspecified gastric ulcer with hemorrhage: Secondary | ICD-10-CM | POA: Diagnosis not present

## 2023-12-20 DIAGNOSIS — K250 Acute gastric ulcer with hemorrhage: Secondary | ICD-10-CM

## 2023-12-20 DIAGNOSIS — K922 Gastrointestinal hemorrhage, unspecified: Secondary | ICD-10-CM | POA: Diagnosis not present

## 2023-12-20 DIAGNOSIS — R1084 Generalized abdominal pain: Secondary | ICD-10-CM | POA: Diagnosis not present

## 2023-12-20 LAB — CBC
HCT: 30 % — ABNORMAL LOW (ref 36.0–46.0)
Hemoglobin: 9.4 g/dL — ABNORMAL LOW (ref 12.0–15.0)
MCH: 30.1 pg (ref 26.0–34.0)
MCHC: 31.3 g/dL (ref 30.0–36.0)
MCV: 96.2 fL (ref 80.0–100.0)
Platelets: 206 10*3/uL (ref 150–400)
RBC: 3.12 MIL/uL — ABNORMAL LOW (ref 3.87–5.11)
RDW: 14.7 % (ref 11.5–15.5)
WBC: 3.3 10*3/uL — ABNORMAL LOW (ref 4.0–10.5)
nRBC: 0 % (ref 0.0–0.2)

## 2023-12-20 LAB — PROTIME-INR
INR: 1.1 (ref 0.8–1.2)
Prothrombin Time: 14.2 s (ref 11.4–15.2)

## 2023-12-20 LAB — APTT: aPTT: 30 s (ref 24–36)

## 2023-12-20 LAB — BASIC METABOLIC PANEL WITH GFR
Anion gap: 6 (ref 5–15)
BUN: 18 mg/dL (ref 6–20)
CO2: 25 mmol/L (ref 22–32)
Calcium: 8.5 mg/dL — ABNORMAL LOW (ref 8.9–10.3)
Chloride: 106 mmol/L (ref 98–111)
Creatinine, Ser: 0.61 mg/dL (ref 0.44–1.00)
GFR, Estimated: >60 mL/min (ref >60)
Glucose, Bld: 101 mg/dL — ABNORMAL HIGH (ref 70–99)
Potassium: 3.5 mmol/L (ref 3.5–5.1)
Sodium: 137 mmol/L (ref 135–145)

## 2023-12-20 LAB — GLUCOSE, CAPILLARY: Glucose-Capillary: 83 mg/dL (ref 70–99)

## 2023-12-20 MED ORDER — BISACODYL 5 MG PO TBEC: 5.0000 mg | DELAYED_RELEASE_TABLET | Freq: Every day | ORAL | 0 refills | Status: DC | PRN

## 2023-12-20 NOTE — Progress Notes (Signed)
 Subjective: Patient states still having a lot of mid abdominal pain that radiates around to her R flank this morning. Pain began last week. Pain meds help to decrease her abdominal pain but not alleviate it. She cannot pinpoint any specific precipitating factors. Does not think eating has an effect on her pain. No BMs so far this morning. Some mild nausea today but no vomiting. Feels that nausea may be secondary to abdominal pain.   Objective: Vital signs in last 24 hours: Temp:  [97.5 F (36.4 C)-98.2 F (36.8 C)] 98.2 F (36.8 C) (04/09 0307) Pulse Rate:  [74-116] 94 (04/09 0643) Resp:  [11-26] 20 (04/09 0327) BP: (86-138)/(58-86) 117/74 (04/09 0643) SpO2:  [96 %-100 %] 100 % (04/09 0327) Weight:  [80 kg-84.7 kg] 84.7 kg (04/09 0151) Last BM Date : 12/18/23 General:   Alert and oriented, pleasant Head:  Normocephalic and atraumatic. Eyes:  No icterus, sclera clear. Conjuctiva pink.  Mouth:  Without lesions, mucosa pink and moist.   Heart:  S1, S2 present, no murmurs noted.  Lungs: Clear to auscultation bilaterally, without wheezing, rales, or rhonchi.  Abdomen:  Bowel sounds present, soft, generalized ttp, though worse on right upper mid and lower abdomen. non-distended. No HSM or hernias noted. No rebound or guarding. No masses appreciated  Extremities:  Without clubbing or edema. Psych:  Alert and cooperative. Normal mood and affect.  Intake/Output from previous day: 04/08 0701 - 04/09 0700 In: 1808.2 [P.O.:160; I.V.:1648.2] Out: 450 [Urine:450] Intake/Output this shift: No intake/output data recorded.  Lab Results: Recent Labs    12/19/23 0059 12/20/23 0415  WBC 4.7 3.3*  HGB 11.2* 9.4*  HCT 34.4* 30.0*  PLT 252 206   BMET Recent Labs    12/19/23 0059 12/20/23 0415  NA 139 137  K 3.6 3.5  CL 104 106  CO2 25 25  GLUCOSE 101* 101*  BUN 31* 18  CREATININE 0.83 0.61  CALCIUM 9.2 8.5*   LFT Recent Labs    12/19/23 0059  PROT 6.8  ALBUMIN 3.8  AST 20   ALT 18  ALKPHOS 83  BILITOT 0.4   PT/INR Recent Labs    12/19/23 0059 12/20/23 0415  LABPROT 13.0 14.2  INR 1.0 1.1    Assessment: 58 year old female with history of arthritis, bullous pemphigus, osteomyelitis, anxiety, hepatic cyst, hematemesis in the setting of esophagitis, 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   Hematemesis, rectal bleeding, gastric and duodenal angioectasias:  Recurrent hematemesis also with new onset of BRBPR starting Monday. Mild hemoglobin decline from 11.2 from 12.2 5 days ago. EGD 12/14/2023 with 13 bleeding angioectasias in the stomach s/p APC in 1 nonbleeding angiectasia in the duodenum s/p APC.   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 Edges of oozing ulcers were successfully injected with 4 mL of a 0.1 mg/mL solution of epinephrine for hemostasis. For hemostasis, two hemostatic clips were successfully placed (one in each ulcer).  Recommended PPI BID, carafate 1g QID x1 month and CTA A/P to further evaluate her generalized abdominal pain. CTA results are pending **   Hgb is 9.4 today, down from 11.2 yesterday. Likely some aspect of hemodilution/equilibration. Will need to continue to closely trend her hgb and monitor for overt GI bleeding. no BMs today thus far. Will need to consider colonoscopy if rectal bleeding recurs  or hemoglobin continues to decline. May also consider colonoscopy if no findings to explain abdominal pain seen on CTA.   Notably last TCS done June 2023 at [facility] with normal terminal ileium, normal colon and internal hemorrhoids   Plan: PPI BID Carafate 1g QID x1 month Follow for CTA results  Colonoscopy if recurrent GI bleeding, continued decline in hgb or negative CTA  Avoid NSAIDs Pain and nausea meds per hospitalist    LOS: 0  days    12/20/2023, 9:17 AM   Beonca Gibb L. Jeanmarie Hubert, MSN, APRN, AGNP-C Adult-Gerontology Nurse Practitioner [facility]

## 2023-12-20 NOTE — Discharge Summary (Signed)
 Physician Discharge Summary   Patient: Sabrina [name] MRN: [id number] DOB: 11/30/1965  Admit date:     12/19/2023  Discharge date: 12/20/23  Discharge Physician: Kendell Bane   PCP: [name], MD   Recommendations at discharge:    Follow-up with a gastroenterologist in 1-2 weeks Protonix twice a day Carafate 1g 4 times a day x1 month Colonoscopy If recurrent GI bleeding, continued decline in blood count Avoid  ALL NSAIDs (Such as steroids, aspirin, ibuprofen, naproxen, meloxicam ..Marland Kitchen)  Discharge Diagnoses: Principal Problem:   GI bleed  Resolved Problems:   * No resolved hospital problems. *  Hospital Course: Sabrina [name] is a 57 year old female with a past medical history of GI bleed, anxiety, depression, hyperlipidemia, GERD, bullous pemphigoid-on prior treatment with methotrexate/steroid, multinodular goiter, chronic repair, repair 2020, presenting with hematemesis and rectal bleed.  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  ED evaluation/course: Blood pressure (!) 93/55, pulse 86, temperature 98.3 F (36.8 C), resp. rate 18, height 5\' 4"  (1.626 m), weight 80 kg, SpO2 97%.  Labs: WBC 4.7, hemoglobin 11.2, BUN 31, glucose 101 otherwise within normal limits     Assessment and Plan:  Acute on chronic GI bleed hematemesis with rectal bleed  -Admitted for further evaluation of GI bleed - NPO, IVY, w IV Protonix -Baseline hemoglobin around 12 -Globin stable, on discharge for /44/25 hemoglobin was 12.2 today 11.2  -GI consult appreciated -IV Protonix 40 mg twice daily  -Patient does take phentermine daily -12/14/23--EGD--13 angioectasias in stomach s/p APC; single nonbleeding  angioectasia in second portion duodenum; normal esophagus  -12/14/23 RUQ US--Prior cholecystectomy. No ductal dilatation. Numerous hepatic cysts    12/19/2023 EGD under propofol sedation. FINDINGS: - 1 cm hiatal hernia.  - Eight cratered gastric ulcers were found, two with oozing hemorrhage (Forrest Class Ib) were found in the gastric body. The largest lesion was 8 mm in largest dimension. 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 Edges of oozing ulcers were successfully injected with 4 mL of a 0.1 mg/mL solution of epinephrine for hemostasis. For hemostasis, two hemostatic clips were successfully placed (onein each ulcer). Clip manufacturer: [employer]. There was no bleeding at the end of the procedure.  - Normal examined duodenum.   RECOMMENDATIONS - Clear liquid diet today.  - Proceed with CT angio abdomen/pelvis with IV contrast. - If recurrent rectal bleeding, will need to proceed with colonoscopy. - Use Protonix (pantoprazole) 40 mg PO BID.  - Use sucralfate suspension 1 gram PO QID for 1 month.        Depression/anxiety -Continue home dose diazepam and venlafaxine -PDMP reviewed--denies, 10 mg, last refill 12/08/2023   Chronic pain syndrome -Continue gabapentin   Multinodular goiter -11/24/2023 thyroid biopsy--benign follicular nodule   History of bullous pemphigoid -Now off of any immunomodulators and DMA RD's-outpatient follow-up with her dermatologist at [facility] as above for further management   Obesity Body mass index is 30.27 kg/m.  -lifestyle modfication  Assessment and Plan: No notes have been filed under this hospital service. Service: Hospitalist        Consultants: Gastroenterologist Procedures performed: EGD Disposition: Home Diet recommendation:  Discharge Diet Orders (From admission, onward)     Start     Ordered   12/20/23 0000  Diet -  low sodium heart healthy        12/20/23 1100           Clear liquid diet, advance  as tolerated DISCHARGE MEDICATION: Allergies as of 12/20/2023       Reactions   Droperidol Palpitations, Other (See Comments)   Elevates BP increases HR   Ondansetron Hives, Swelling, Rash   Spots around IV site IV Zofran   Benzonatate Rash, Other (See Comments)   Caused burning pain in eyes  Watery eyes  Vision changes  Conjunctivitis   Bromfed Dm [pseudoeph-bromphen-dm] Anxiety   Diclofenac Sodium Rash   Flexeril [cyclobenzaprine] Anxiety        Medication List     STOP taking these medications    tiZANidine 4 MG tablet Commonly known as: ZANAFLEX       TAKE these medications    acetaminophen 325 MG tablet Commonly known as: TYLENOL Take 325-650 mg by mouth every 6 (six) hours as needed for mild pain (pain score 1-3).   bisacodyl 5 MG EC tablet Commonly known as: DULCOLAX Take 1 tablet (5 mg total) by mouth daily as needed for moderate constipation.   butalbital-acetaminophen-caffeine 50-325-40 MG tablet Commonly known as: FIORICET Take 1 tablet by mouth daily as needed for headache or migraine.   chlorpheniramine-HYDROcodone 10-8 MG/5ML Commonly known as: TUSSIONEX Take 5 mLs by mouth every 12 (twelve) hours as needed for cough.   diazepam 10 MG tablet Commonly known as: VALIUM 1 qam  2 qhs What changed:  how much to take how to take this when to take this additional instructions   Ferrex 150 150 MG capsule Generic drug: iron polysaccharides Take 150 mg by mouth daily.   gabapentin 300 MG capsule Commonly known as: NEURONTIN Take 2 capsules (600 mg total) by mouth 3 (three) times daily.   oxyCODONE 5 MG immediate release tablet Commonly known as: Oxy IR/ROXICODONE Take 1 tablet (5 mg total) by mouth every 6 (six) hours as needed for moderate pain (pain score 4-6).   pantoprazole 40 MG tablet Commonly known as: PROTONIX Take 1 tablet (40 mg total) by mouth 2 (two) times daily. What changed: Another medication with the same name was removed.  Continue taking this medication, and follow the directions you see here.   phentermine 37.5 MG tablet Commonly known as: ADIPEX-P Take 37.5 mg by mouth 3 (three) times a week.   rosuvastatin 20 MG tablet Commonly known as: CRESTOR Take 1 tablet by mouth daily.   sucralfate 1 g tablet Commonly known as: Carafate Take 1 tablet (1 g total) by mouth 4 (four) times daily.   venlafaxine XR 150 MG 24 hr capsule Commonly known as: EFFEXOR-XR Take 1 capsule (150 mg total) by mouth every morning.        Discharge Exam: Filed Weights   12/19/23 0035 12/19/23 1129 12/20/23 0151  Weight: 80 kg 80 kg 84.7 kg        General:  AAO x 3,  cooperative, no distress;   HEENT:  Normocephalic, PERRL, otherwise with in Normal limits   Neuro:  CNII-XII intact. , normal motor and sensation, reflexes intact   Lungs:   Clear to auscultation BL, Respirations unlabored,  No wheezes / crackles  Cardio:    S1/S2, RRR, No murmure, No Rubs or Gallops   Abdomen:  Soft, non-tender, bowel sounds active all four quadrants, no guarding or peritoneal signs.  Muscular  skeletal:  Limited exam -global generalized weaknesses - in bed, able to move  all 4 extremities,   2+ pulses,  symmetric, No pitting edema  Skin:  Dry, warm to touch, negative for any Rashes,  Wounds: Please see nursing documentation          Condition at discharge: good  The results of significant diagnostics from this hospitalization (including imaging, microbiology, ancillary and laboratory) are listed below for reference.   Imaging Studies: ECHOCARDIOGRAM COMPLETE Result Date: 12/15/2023    ECHOCARDIOGRAM REPORT   Patient Name:   Sabrina [name] Date of Exam: 12/15/2023 Medical Rec #:  [id number]     Height:       64.0 in Accession #:    [id number]    Weight:       177.0 lb Date of Birth:  May 16, 1966    BSA:          1.858 m Patient Age:    57 years      BP:           102/76 mmHg Patient Gender: F             HR:           100 bpm. Exam  [facility] Procedure: 2D Echo, Cardiac Doppler and Color Doppler (Both Spectral and Color            Flow Doppler were utilized during procedure). Indications:    Dyspnea R06.00  History:        Patient has no prior history of Echocardiogram examinations.                 Arrythmias:Tachycardia; Risk Factors:Dyslipidemia.  Sonographer:    Celesta Gentile RCS Referring Phys: [id number] DAVID TAT IMPRESSIONS  1. Left ventricular ejection fraction, by estimation, is 55 to 60%. The left ventricle has normal function. The left ventricle has no regional wall motion abnormalities. Left ventricular diastolic parameters are consistent with Grade I diastolic dysfunction (impaired relaxation).  2. Right ventricular systolic function is normal. The right ventricular size is normal.  3. The mitral valve is normal in structure. No evidence of mitral valve regurgitation. No evidence of mitral stenosis.  4. The aortic valve is tricuspid. Aortic valve regurgitation is not visualized. No aortic stenosis is present.  5. The inferior vena cava is normal in size with greater than 50% respiratory variability, suggesting right atrial pressure of 3 mmHg. FINDINGS  Left Ventricle: Left ventricular ejection fraction, by estimation, is 55 to 60%. The left ventricle has normal function. The left ventricle has no regional wall motion abnormalities. The left ventricular internal cavity size was normal in size. There is  no left ventricular hypertrophy. Left ventricular diastolic parameters are consistent with Grade I diastolic dysfunction (impaired relaxation). Normal left ventricular filling pressure. Right Ventricle: The right ventricular size is normal. Right vetricular wall thickness was not well visualized. Right ventricular systolic function is normal. Left Atrium: Left atrial size was normal in size. Right Atrium: Right atrial size was normal in size. Pericardium: There is no evidence of pericardial effusion. Mitral Valve: The mitral  valve is normal in structure. No evidence of mitral valve regurgitation. No evidence of mitral valve stenosis. Tricuspid Valve: The tricuspid valve is normal in structure. Tricuspid valve regurgitation is not demonstrated. No evidence of tricuspid stenosis. Aortic Valve: The aortic valve is tricuspid. Aortic valve regurgitation is not visualized. No aortic stenosis is present. Aortic valve mean gradient measures 2.4 mmHg. Aortic valve peak gradient measures 5.1 mmHg. Aortic valve area, by VTI measures 1.95 cm. Pulmonic Valve:  The pulmonic valve was not well visualized. Pulmonic valve regurgitation is not visualized. No evidence of pulmonic stenosis. Aorta: The aortic root is normal in size and structure. Venous: The inferior vena cava is normal in size with greater than 50% respiratory variability, suggesting right atrial pressure of 3 mmHg. IAS/Shunts: The interatrial septum is aneurysmal. No atrial level shunt detected by color flow Doppler.  LEFT VENTRICLE PLAX 2D LVIDd:         4.80 cm   Diastology LVIDs:         3.10 cm   LV e' medial:    6.09 cm/s LV PW:         0.80 cm   LV E/e' medial:  5.9 LV IVS:        0.90 cm   LV e' lateral:   9.14 cm/s LVOT diam:     1.70 cm   LV E/e' lateral: 3.9 LV SV:         38 LV SV Index:   21 LVOT Area:     2.27 cm  RIGHT VENTRICLE RV S prime:     11.40 cm/s TAPSE (M-mode): 1.2 cm LEFT ATRIUM             Index        RIGHT ATRIUM           Index LA diam:        3.70 cm 1.99 cm/m   RA Area:     14.60 cm LA Vol (A2C):   53.1 ml 28.59 ml/m  RA Volume:   38.70 ml  20.83 ml/m LA Vol (A4C):   37.3 ml 20.08 ml/m LA Biplane Vol: 47.3 ml 25.46 ml/m  AORTIC VALVE AV Area (Vmax):    1.84 cm AV Area (Vmean):   2.07 cm AV Area (VTI):     1.95 cm AV Vmax:           113.17 cm/s AV Vmean:          72.420 cm/s AV VTI:            0.196 m AV Peak Grad:      5.1 mmHg AV Mean Grad:      2.4 mmHg LVOT Vmax:         91.80 cm/s LVOT Vmean:        66.000 cm/s LVOT VTI:          0.169 m  LVOT/AV VTI ratio: 0.86  AORTA Ao Root diam: 3.20 cm MITRAL VALVE MV Area (PHT): 2.32 cm    SHUNTS MV Decel Time: 327 msec    Systemic VTI:  0.17 m MV E velocity: 36.10 cm/s  Systemic Diam: 1.70 cm MV A velocity: 66.40 cm/s MV E/A ratio:  0.54 Dina Rich MD Electronically signed by Dina Rich MD Signature Date/Time: 12/15/2023/2:12:54 PM    Final    US Abdomen Limited RUQ (LIVER/GB) Result Date: 12/14/2023 CLINICAL DATA:  Cirrhosis EXAM: ULTRASOUND ABDOMEN LIMITED RIGHT UPPER QUADRANT COMPARISON:  Ultrasound 11/03/2023.  CT 10/26/2023. FINDINGS: Gallbladder: Prior cholecystectomy Common bile duct: Diameter: 6 mm Liver: Homogeneous hepatic parenchyma. Multiple cystic areas are identified as on previous examination. There is a large area anteriorly measuring 7 cm. Smaller foci also identified. A few these have multiple septa. Portal vein is patent on color Doppler imaging with normal direction of blood flow towards the liver. Other: None. IMPRESSION: Prior cholecystectomy. No ductal dilatation. Numerous hepatic cysts are present, some of which appear mildly complex by ultrasound. Please  correlate with prior CT scan. Electronically Signed   By: Karen Kays M.D.   On: 12/14/2023 16:26   [place] FNA BX THYROID 1ST LESION AFIRMA Result Date: 11/24/2023 INDICATION: Indeterminate thyroid nodule EXAM: ULTRASOUND GUIDED FINE NEEDLE ASPIRATION OF INDETERMINATE THYROID NODULE COMPARISON:  US Thyroid dated November 03, 2023 MEDICATIONS: Lidocaine 1% - 4 mls COMPLICATIONS: None immediate. TECHNIQUE: Informed written consent was obtained from the patient after a discussion of the risks, benefits and alternatives to treatment. Questions regarding the procedure were encouraged and answered. A timeout was performed prior to the initiation of the procedure. Pre-procedural ultrasound scanning demonstrated unchanged size and appearance of the indeterminate nodule within the inferior aspect of the left thyroid The procedure was  planned. The neck was prepped in the usual sterile fashion, and a sterile drape was applied covering the operative field. A timeout was performed prior to the initiation of the procedure. Local anesthesia was provided with 1% lidocaine. Under direct ultrasound guidance, 5 FNA biopsies were performed of the nodule located in the inferior aspecr of the left thyroid with a 25 gauge needle. Two samples were sent to [facility] per ordering MD. Multiple ultrasound images were saved for procedural documentation purposes. The samples were prepared and submitted to pathology. Limited post procedural scanning was negative for hematoma or additional complication. Dressings were placed. The patient tolerated the above procedures procedure well without immediate postprocedural complication. FINDINGS: Nodule reference number based on prior diagnostic ultrasound: 5 Maximum size: 1.6 cm Location: Left; Inferior ACR TI-RADS risk category: 3 Reason for biopsy: meets ACR TI-RADS criteria Ultrasound imaging confirms appropriate placement of the needles within the thyroid nodule. IMPRESSION: Technically successful ultrasound guided fine needle aspiration of nodule located in the inferior aspect of the left thyroid. Performed by Anders Grant NP Electronically Signed   By: Marliss Coots M.D.   On: 11/24/2023 11:18    Microbiology: Results for orders placed or performed during the hospital encounter of 10/03/20  SARS CORONAVIRUS 2 (TAT 6-24 HRS) Nasopharyngeal Nasopharyngeal Swab     Status: None   Collection Time: 10/03/20  2:36 PM   Specimen: Nasopharyngeal Swab  Result Value Ref Range Status   SARS Coronavirus 2 NEGATIVE NEGATIVE Final    Comment: (NOTE) SARS-CoV-2 target nucleic acids are NOT DETECTED.  The SARS-CoV-2 RNA is generally detectable in upper and lower respiratory specimens during the acute phase of infection. Negative results do not preclude SARS-CoV-2 infection, do not rule out co-infections with other  pathogens, and should not be used as the sole basis for treatment or other patient management decisions. Negative results must be combined with clinical observations, patient history, and epidemiological information. The expected result is Negative.  Fact Sheet for Patients: HairSlick.no  Fact Sheet for Healthcare Providers: quierodirigir.com  This test is not yet approved or cleared by the [place] FDA and  has been authorized for detection and/or diagnosis of SARS-CoV-2 by FDA under an Emergency Use Authorization (EUA). This EUA will remain  in effect (meaning this test can be used) for the duration of the COVID-19 declaration under Se ction 564(b)(1) of the Act, 21 U.S.C. section 360bbb-3(b)(1), unless the authorization is terminated or revoked sooner.  Performed at [facility], [address] [place], [place] [address]     Labs: CBC: Recent Labs  Lab 12/14/23 0416 12/15/23 0448 12/19/23 0059 12/20/23 0415  WBC 4.6 5.0 4.7 3.3*  HGB 12.0 12.2 11.2* 9.4*  HCT 38.1 38.8 34.4* 30.0*  MCV 94.5 93.9 93.2  96.2  PLT 278 264 252 206   Basic Metabolic Panel: Recent Labs  Lab 12/14/23 0416 12/19/23 0059 12/20/23 0415  NA 138 139 137  K 3.8 3.6 3.5  CL 104 104 106  CO2 25 25 25   GLUCOSE 98 101* 101*  BUN 28* 31* 18  CREATININE 0.78 0.83 0.61  CALCIUM 8.8* 9.2 8.5*  MG  --  2.3  --   PHOS  --  4.2  --    Liver Function Tests: Recent Labs  Lab 12/19/23 0059  AST 20  ALT 18  ALKPHOS 83  BILITOT 0.4  PROT 6.8  ALBUMIN 3.8   CBG: Recent Labs  Lab 12/19/23 0741 12/20/23 0732  GLUCAP 92 83    Discharge time spent: greater than 30 minutes.  Signed: Kendell Bane, MD [facility] 12/20/2023

## 2023-12-20 NOTE — Plan of Care (Signed)
 Pt is alert and oriented x 4. Compliant of severe pain during overnight. Requiring dilaudid 3 x so far this shift. Vitals stable. UP ad lib in room to bathroom. NO stool noted or bleeding.  Problem: Pain Managment: Goal: General experience of comfort will improve and/or be controlled Outcome: Not Progressing   Problem: Education: Goal: Knowledge of General Education information will improve Description: Including pain rating scale, medication(s)/side effects and non-pharmacologic comfort measures Outcome: Progressing   Problem: Health Behavior/Discharge Planning: Goal: Ability to manage health-related needs will improve Outcome: Progressing   Problem: Clinical Measurements: Goal: Ability to maintain clinical measurements within normal limits will improve Outcome: Progressing Goal: Will remain free from infection Outcome: Progressing Goal: Diagnostic test results will improve Outcome: Progressing Goal: Respiratory complications will improve Outcome: Progressing Goal: Cardiovascular complication will be avoided Outcome: Progressing   Problem: Activity: Goal: Risk for activity intolerance will decrease Outcome: Progressing   Problem: Nutrition: Goal: Adequate nutrition will be maintained Outcome: Progressing   Problem: Coping: Goal: Level of anxiety will decrease Outcome: Progressing   Problem: Elimination: Goal: Will not experience complications related to bowel motility Outcome: Progressing Goal: Will not experience complications related to urinary retention Outcome: Progressing   Problem: Safety: Goal: Ability to remain free from injury will improve Outcome: Progressing   Problem: Skin Integrity: Goal: Risk for impaired skin integrity will decrease Outcome: Progressing

## 2023-12-21 ENCOUNTER — Telehealth (INDEPENDENT_AMBULATORY_CARE_PROVIDER_SITE_OTHER): Payer: Self-pay

## 2023-12-21 ENCOUNTER — Encounter (HOSPITAL_COMMUNITY): Payer: Self-pay | Admitting: Emergency Medicine

## 2023-12-21 ENCOUNTER — Inpatient Hospital Stay (HOSPITAL_COMMUNITY)
Admission: EM | Admit: 2023-12-21 | Discharge: 2023-12-26 | DRG: 378 | Disposition: A | Discharge status: Home / Self Care | Attending: Internal Medicine | Admitting: Internal Medicine

## 2023-12-21 ENCOUNTER — Other Ambulatory Visit: Payer: Self-pay

## 2023-12-21 DIAGNOSIS — K31811 Angiodysplasia of stomach and duodenum with bleeding: Secondary | ICD-10-CM

## 2023-12-21 DIAGNOSIS — Z79899 Other long term (current) drug therapy: Secondary | ICD-10-CM

## 2023-12-21 DIAGNOSIS — K648 Other hemorrhoids: Secondary | ICD-10-CM | POA: Diagnosis present

## 2023-12-21 DIAGNOSIS — G8929 Other chronic pain: Secondary | ICD-10-CM | POA: Diagnosis present

## 2023-12-21 DIAGNOSIS — K2970 Gastritis, unspecified, without bleeding: Secondary | ICD-10-CM

## 2023-12-21 DIAGNOSIS — Z1152 Encounter for screening for COVID-19: Secondary | ICD-10-CM

## 2023-12-21 DIAGNOSIS — K31819 Angiodysplasia of stomach and duodenum without bleeding: Secondary | ICD-10-CM | POA: Diagnosis present

## 2023-12-21 DIAGNOSIS — K921 Melena: Secondary | ICD-10-CM | POA: Diagnosis present

## 2023-12-21 DIAGNOSIS — F410 Panic disorder [episodic paroxysmal anxiety] without agoraphobia: Secondary | ICD-10-CM | POA: Diagnosis present

## 2023-12-21 DIAGNOSIS — M159 Polyosteoarthritis, unspecified: Secondary | ICD-10-CM | POA: Diagnosis present

## 2023-12-21 DIAGNOSIS — Z602 Problems related to living alone: Secondary | ICD-10-CM | POA: Diagnosis present

## 2023-12-21 DIAGNOSIS — L120 Bullous pemphigoid: Secondary | ICD-10-CM | POA: Diagnosis present

## 2023-12-21 DIAGNOSIS — K922 Gastrointestinal hemorrhage, unspecified: Principal | ICD-10-CM

## 2023-12-21 DIAGNOSIS — F331 Major depressive disorder, recurrent, moderate: Secondary | ICD-10-CM | POA: Diagnosis present

## 2023-12-21 DIAGNOSIS — G4700 Insomnia, unspecified: Secondary | ICD-10-CM | POA: Diagnosis present

## 2023-12-21 DIAGNOSIS — R109 Unspecified abdominal pain: Secondary | ICD-10-CM | POA: Diagnosis present

## 2023-12-21 DIAGNOSIS — F411 Generalized anxiety disorder: Secondary | ICD-10-CM | POA: Diagnosis present

## 2023-12-21 DIAGNOSIS — K2961 Other gastritis with bleeding: Secondary | ICD-10-CM | POA: Diagnosis present

## 2023-12-21 DIAGNOSIS — R519 Headache, unspecified: Secondary | ICD-10-CM | POA: Diagnosis present

## 2023-12-21 DIAGNOSIS — K449 Diaphragmatic hernia without obstruction or gangrene: Secondary | ICD-10-CM | POA: Diagnosis present

## 2023-12-21 DIAGNOSIS — Z90710 Acquired absence of both cervix and uterus: Secondary | ICD-10-CM

## 2023-12-21 DIAGNOSIS — K209 Esophagitis, unspecified without bleeding: Secondary | ICD-10-CM | POA: Diagnosis present

## 2023-12-21 DIAGNOSIS — F4310 Post-traumatic stress disorder, unspecified: Secondary | ICD-10-CM | POA: Diagnosis present

## 2023-12-21 DIAGNOSIS — E66811 Obesity, class 1: Secondary | ICD-10-CM | POA: Diagnosis present

## 2023-12-21 DIAGNOSIS — F419 Anxiety disorder, unspecified: Secondary | ICD-10-CM | POA: Diagnosis present

## 2023-12-21 DIAGNOSIS — Z888 Allergy status to other drugs, medicaments and biological substances status: Secondary | ICD-10-CM

## 2023-12-21 DIAGNOSIS — Z818 Family history of other mental and behavioral disorders: Secondary | ICD-10-CM

## 2023-12-21 DIAGNOSIS — Z8041 Family history of malignant neoplasm of ovary: Secondary | ICD-10-CM

## 2023-12-21 DIAGNOSIS — E785 Hyperlipidemia, unspecified: Secondary | ICD-10-CM | POA: Diagnosis present

## 2023-12-21 DIAGNOSIS — Z800 Family history of malignant neoplasm of digestive organs: Secondary | ICD-10-CM

## 2023-12-21 DIAGNOSIS — E876 Hypokalemia: Secondary | ICD-10-CM | POA: Diagnosis present

## 2023-12-21 DIAGNOSIS — K920 Hematemesis: Secondary | ICD-10-CM

## 2023-12-21 DIAGNOSIS — G4710 Hypersomnia, unspecified: Secondary | ICD-10-CM | POA: Diagnosis present

## 2023-12-21 DIAGNOSIS — Z6831 Body mass index (BMI) 31.0-31.9, adult: Secondary | ICD-10-CM

## 2023-12-21 DIAGNOSIS — G894 Chronic pain syndrome: Secondary | ICD-10-CM | POA: Diagnosis present

## 2023-12-21 DIAGNOSIS — F32A Depression, unspecified: Secondary | ICD-10-CM | POA: Diagnosis present

## 2023-12-21 DIAGNOSIS — K259 Gastric ulcer, unspecified as acute or chronic, without hemorrhage or perforation: Secondary | ICD-10-CM

## 2023-12-21 DIAGNOSIS — K7689 Other specified diseases of liver: Secondary | ICD-10-CM | POA: Diagnosis present

## 2023-12-21 DIAGNOSIS — K250 Acute gastric ulcer with hemorrhage: Principal | ICD-10-CM | POA: Diagnosis present

## 2023-12-21 DIAGNOSIS — Z8042 Family history of malignant neoplasm of prostate: Secondary | ICD-10-CM

## 2023-12-21 DIAGNOSIS — E042 Nontoxic multinodular goiter: Secondary | ICD-10-CM | POA: Diagnosis present

## 2023-12-21 DIAGNOSIS — R1084 Generalized abdominal pain: Secondary | ICD-10-CM

## 2023-12-21 LAB — COMPREHENSIVE METABOLIC PANEL WITH GFR
ALT: 20 U/L (ref 0–44)
AST: 25 U/L (ref 15–41)
Albumin: 4.1 g/dL (ref 3.5–5.0)
Alkaline Phosphatase: 98 U/L (ref 38–126)
Anion gap: 9 (ref 5–15)
BUN: 20 mg/dL (ref 6–20)
CO2: 26 mmol/L (ref 22–32)
Calcium: 9.6 mg/dL (ref 8.9–10.3)
Chloride: 103 mmol/L (ref 98–111)
Creatinine, Ser: 0.96 mg/dL (ref 0.44–1.00)
GFR, Estimated: >60 mL/min (ref >60)
Glucose, Bld: 84 mg/dL (ref 70–99)
Potassium: 3 mmol/L — ABNORMAL LOW (ref 3.5–5.1)
Sodium: 138 mmol/L (ref 135–145)
Total Bilirubin: 0.4 mg/dL (ref 0.0–1.2)
Total Protein: 7.3 g/dL (ref 6.5–8.1)

## 2023-12-21 LAB — POC OCCULT BLOOD, ED: Occult Blood: NEGATIVE

## 2023-12-21 LAB — CBC
HCT: 29.3 % — ABNORMAL LOW (ref 36.0–46.0)
Hemoglobin: 9.4 g/dL — ABNORMAL LOW (ref 12.0–15.0)
MCH: 30.1 pg (ref 26.0–34.0)
MCHC: 32.1 g/dL (ref 30.0–36.0)
MCV: 93.9 fL (ref 80.0–100.0)
Platelets: 212 10*3/uL (ref 150–400)
RBC: 3.12 MIL/uL — ABNORMAL LOW (ref 3.87–5.11)
RDW: 14.3 % (ref 11.5–15.5)
WBC: 3.7 10*3/uL — ABNORMAL LOW (ref 4.0–10.5)
nRBC: 0 % (ref 0.0–0.2)

## 2023-12-21 LAB — PRE-ADMISSION TESTING DIAGNOSIS: Occult Blood: NEGATIVE

## 2023-12-21 LAB — TYPE AND SCREEN
ABO/RH(D): A POS
Antibody Screen: NEGATIVE

## 2023-12-21 MED ORDER — ROSUVASTATIN CALCIUM 20 MG PO TABS
20.0000 mg | ORAL_TABLET | Freq: Every day | ORAL | Status: DC
  Administered 2023-12-22 – 2023-12-26 (×5): 20 mg via ORAL
  Filled 2023-12-21 (×5): qty 1

## 2023-12-21 MED ORDER — BISACODYL 5 MG PO TBEC
10.0000 mg | DELAYED_RELEASE_TABLET | Freq: Once | ORAL | Status: AC
  Administered 2023-12-21: 10 mg via ORAL
  Filled 2023-12-21: qty 2

## 2023-12-21 MED ORDER — SODIUM CHLORIDE 0.9 % IV SOLN
12.5000 mg | Freq: Three times a day (TID) | INTRAVENOUS | Status: DC | PRN
  Administered 2023-12-21 – 2023-12-23 (×3): 12.5 mg via INTRAVENOUS
  Filled 2023-12-21 (×3): qty 0.5

## 2023-12-21 MED ORDER — INFLUENZA VIRUS VACC SPLIT PF (FLUZONE) 0.5 ML IM SUSY
0.5000 mL | PREFILLED_SYRINGE | INTRAMUSCULAR | Status: DC
  Filled 2023-12-21: qty 0.5

## 2023-12-21 MED ORDER — GABAPENTIN 300 MG PO CAPS
600.0000 mg | ORAL_CAPSULE | Freq: Three times a day (TID) | ORAL | Status: DC
  Administered 2023-12-21 – 2023-12-23 (×4): 600 mg via ORAL
  Filled 2023-12-21 (×4): qty 2

## 2023-12-21 MED ORDER — DIAZEPAM 5 MG PO TABS
10.0000 mg | ORAL_TABLET | Freq: Two times a day (BID) | ORAL | Status: DC | PRN
Start: 2023-12-21 — End: 2023-12-25
  Administered 2023-12-21 – 2023-12-25 (×2): 10 mg via ORAL
  Filled 2023-12-21 (×2): qty 2

## 2023-12-21 MED ORDER — VENLAFAXINE HCL ER 75 MG PO CP24
150.0000 mg | ORAL_CAPSULE | Freq: Every day | ORAL | Status: DC
  Administered 2023-12-22 – 2023-12-24 (×3): 150 mg via ORAL
  Filled 2023-12-21 (×3): qty 2

## 2023-12-21 MED ORDER — HYDROMORPHONE HCL 1 MG/ML IJ SOLN
0.5000 mg | INTRAMUSCULAR | Status: DC | PRN
  Administered 2023-12-21 – 2023-12-25 (×16): 0.5 mg via INTRAVENOUS
  Filled 2023-12-21 (×19): qty 0.5

## 2023-12-21 MED ORDER — PEG 3350-KCL-NA BICARB-NACL 420 G PO SOLR
4000.0000 mL | Freq: Once | ORAL | Status: AC
  Administered 2023-12-21: 4000 mL via ORAL
  Filled 2023-12-21: qty 4000

## 2023-12-21 MED ORDER — SUCRALFATE 1 G PO TABS
1.0000 g | ORAL_TABLET | Freq: Four times a day (QID) | ORAL | Status: DC
  Administered 2023-12-21 – 2023-12-26 (×18): 1 g via ORAL
  Filled 2023-12-21 (×18): qty 1

## 2023-12-21 MED ORDER — POTASSIUM CHLORIDE CRYS ER 20 MEQ PO TBCR
40.0000 meq | EXTENDED_RELEASE_TABLET | Freq: Four times a day (QID) | ORAL | Status: AC
  Administered 2023-12-21 – 2023-12-22 (×2): 40 meq via ORAL
  Filled 2023-12-21 (×2): qty 2

## 2023-12-21 MED ORDER — OXYCODONE HCL 5 MG PO TABS
5.0000 mg | ORAL_TABLET | Freq: Four times a day (QID) | ORAL | Status: DC | PRN
  Administered 2023-12-21 – 2023-12-26 (×12): 5 mg via ORAL
  Filled 2023-12-21 (×12): qty 1

## 2023-12-21 MED ORDER — HYDROMORPHONE HCL 1 MG/ML IJ SOLN
0.5000 mg | Freq: Once | INTRAMUSCULAR | Status: AC
  Administered 2023-12-22: 0.5 mg via INTRAVENOUS
  Filled 2023-12-21 (×2): qty 0.5

## 2023-12-21 MED ORDER — SODIUM CHLORIDE 0.9 % IV BOLUS
1000.0000 mL | Freq: Once | INTRAVENOUS | Status: AC
  Administered 2023-12-21: 1000 mL via INTRAVENOUS

## 2023-12-21 MED ORDER — PANTOPRAZOLE SODIUM 40 MG IV SOLR
40.0000 mg | Freq: Once | INTRAVENOUS | Status: AC
  Administered 2023-12-21: 40 mg via INTRAVENOUS
  Filled 2023-12-21: qty 10

## 2023-12-21 MED ORDER — BUTALBITAL-APAP-CAFFEINE 50-325-40 MG PO TABS
1.0000 | ORAL_TABLET | Freq: Every day | ORAL | Status: DC | PRN
  Administered 2023-12-22: 1 via ORAL
  Filled 2023-12-21: qty 1

## 2023-12-21 NOTE — Plan of Care (Signed)

## 2023-12-21 NOTE — Telephone Encounter (Signed)
 Patient called today stating she is having worsening abdominal pain and sight of bright red blood in toilet three times this morning. She says she has had three looser/soft stools (not diarrhea) this morning and every time she has seen bright red blood in the toilet. She says she has no appetite and can not get comfortable. Denies any vomiting, or fevers. I advised if the pain is severe and she is seeing blood in her stools she would need to report back to the Ed. Patient says she would go,but really did not want to as she feels the Ed staff "blow" off her concerns. I advised that we could not do the testing she would need such as blood work and scans here in the office, and we had no openings today, she would need to go back to the Ed. Patient states understanding and says she will go back to the Ed.

## 2023-12-21 NOTE — H&P (Signed)
 [facility] H&P   Patient Demographics:    Sabrina [name], is a 58 y.o. female  MRN: [id number]   DOB - 1965-10-08  Admit Date - 12/21/2023  Outpatient Primary MD for the patient is Sabrina [name], Sabrina Russell, MD  Referring MD/NP/PA: Dr Sabrina [name]  Patient coming from: home  Chief Complaint  Patient presents with   Hematemesis      HPI:    Sabrina [name]  is a 58 y.o. female,  with a past medical history of GI bleed, anxiety, depression, hyperlipidemia, GERD, bullous pemphigoid-on prior treatment with methotrexate/steroid, multinodular goiter, chronic repair, repair 2020, with multiple admissions in the past for bright red blood per rectum, and coffee-ground emesis, with multiple endoscopies, please see discussion below, patient was just discharged yesterday, she presents to ED with complaints of hematemesis and bright red blood per rectum. - Hospitalization, with endoscopy 4/8, significant for multiple gastric ulcers, delay s/p clipping, and prior to that she had another endoscopy on 4/3, significant for gastritis, and angiodysplasia, and prior to that she had endoscopy in February 13 significant for esophagitis, gastritis, patient had CT a GI protocol done.  Discharge yesterday with no evidence of any active bleed, she presents to ED today secondary to complaints of severe abdominal cramping, having hematemesis, as well as rectal bleeding, her Hemoccult was negative in the ED, her hemoglobin has been stable over the last 24 hours, but it has been gradually dropping over the last few days. - ED her workup significant for low potassium at 3, hemoglobin at 9.4, which is stable over last 24 hours, but it has been 12.2 on/12/2023, she was Hemoccult negative.   Review of systems:     A full 10 point Review of Systems was done, except as stated above, all other Review of Systems were negative.   With Past  History of the following :    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HCC)    Poisoning, snake bite 04/08/2016   "[place]; RUE"   PONV (postoperative nausea and vomiting)    S/P right knee surgery    Situational anxiety    Staph infection ~ 2015   "left elbow and finger"   Thyroid mass       Past Surgical History:  Procedure Laterality Date   APPENDECTOMY  ~ 1987   APPLICATION OF A-CELL OF EXTREMITY Left 08/05/2015   Procedure: APPLICATION OF A-CELL OF EXTREMITY;  Surgeon: Sabrina Form, DO;  Location: [facility];  Service: Plastics;  Laterality: Left;   BIOPSY  10/26/2023  Procedure: BIOPSY;  Surgeon: Sabrina Macho, MD;  Location: [facility];  Service: Endoscopy;;   BREAST SURGERY Right 1990   "milk duct taken out"   CHONDROPLASTY Right 02/19/2019   Procedure: CHONDROPLASTY; EXCISION EXOSTOSIS;  Surgeon: Sabrina Batman, MD;  Location: [facility];  Service: Orthopedics;  Laterality: Right;   DEBRIDEMENT AND CLOSURE WOUND Left 07/01/2015   Procedure: LEFT ELBOW EXCISION OF WOUND WITH PRIMARY CLOSURE 2X5 CM ;  Surgeon: Sabrina Form, DO;  Location: [facility];  Service: Plastics;  Laterality: Left;   ELBOW SURGERY Left X 23 in [place] <06/2015   from a cat bite; all I&D   ESOPHAGOGASTRODUODENOSCOPY N/A 12/14/2023   Procedure: EGD (ESOPHAGOGASTRODUODENOSCOPY);  Surgeon: Sabrina Macho, MD;  Location: [facility];  Service: Endoscopy;  Laterality: N/A;   ESOPHAGOGASTRODUODENOSCOPY N/A 12/19/2023   Procedure: EGD (ESOPHAGOGASTRODUODENOSCOPY);  Surgeon: Marguerita Merles, Reuel Boom, MD;  Location: [facility];  Service: Gastroenterology;   Laterality: N/A;   ESOPHAGOGASTRODUODENOSCOPY (EGD) WITH PROPOFOL N/A 10/26/2023   Procedure: ESOPHAGOGASTRODUODENOSCOPY (EGD) WITH PROPOFOL;  Surgeon: Sabrina Macho, MD;  Location: [facility];  Service: Endoscopy;  Laterality: N/A;   HOT HEMOSTASIS  12/14/2023   Procedure: EGD, WITH ARGON PLASMA COAGULATION;  Surgeon: Sabrina Macho, MD;  Location: [facility];  Service: Endoscopy;;   I & D EXTREMITY Left 07/08/2015   Procedure: IRRIGATION AND DEBRIDEMENT EXTREMITY, DRAINAGE OF LEFT ARM WOUND, A-CELL PLACEMENT, WOUND VAC PLACEMENT;  Surgeon: Sabrina Bills Dillingham, DO;  Location: [facility];  Service: Plastics;  Laterality: Left;   INCISION AND DRAINAGE OF WOUND Left 08/05/2015   Procedure: IRRIGATION AND DEBRIDEMENT LEFT ELBOW WOUND, PLACEMENT OF ACELL;  Surgeon: Sabrina Form, DO;  Location: [facility];  Service: Plastics;  Laterality: Left;   KNEE ARTHROSCOPY WITH MEDIAL MENISECTOMY Right 02/19/2019   Procedure: RIGHT KNEE ARTHROSCOPY, DEBRIDEMENT, PARTIAL MEDIAL AND LATERAL MENISECTOMY;  Surgeon: Sabrina Batman, MD;  Location: [facility];  Service: Orthopedics;  Laterality: Right;   LAPAROSCOPIC CHOLECYSTECTOMY  1998   SKIN GRAFT Left 2016   took from anterior thigh; placed at elbow   TONSILLECTOMY  ~ 2000   TOTAL ABDOMINAL HYSTERECTOMY  2003   WRIST SURGERY Right 01/2016      Social History:     Social History   Tobacco Use   Smoking status: Never   Smokeless tobacco: Never  Substance Use Topics   Alcohol use: Not Currently    Alcohol/week: 0.0 standard drinks of alcohol        Family History :     Family History  Problem Relation Age of Onset   Liver disease Mother    Dementia Mother    Cirrhosis Mother    Prostate cancer Father    Colon cancer Maternal Grandmother    Ovarian cancer Maternal Aunt       Home Medications:   Prior to Admission medications   Medication Sig Start Date End Date Taking? Authorizing Provider   acetaminophen (TYLENOL) 325 MG tablet Take 325-650 mg by mouth every 6 (six) hours as needed for mild pain (pain score 1-3).   Yes [provider]  butalbital-acetaminophen-caffeine (FIORICET) 50-325-40 MG tablet Take 1 tablet by mouth daily as needed for headache or migraine. 11/19/23  Yes [provider]  chlorpheniramine-HYDROcodone (TUSSIONEX) 10-8 MG/5ML Take 5 mLs by mouth every 12 (twelve) hours as needed for cough. 12/09/23  Yes [provider]  diazepam (VALIUM) 10 MG  tablet 1 qam  2 qhs Patient taking differently: Take 10 mg by mouth in the morning and at bedtime. 10 mg am, 20 mg pm 12/12/23  Yes Plovsky, Earvin Hansen, MD  FERREX 150 150 MG capsule Take 150 mg by mouth daily.   Yes [provider]  gabapentin (NEURONTIN) 300 MG capsule Take 2 capsules (600 mg total) by mouth 3 (three) times daily. 08/10/21  Yes Philip Aspen, Limmie Patricia, MD  pantoprazole (PROTONIX) 40 MG tablet Take 1 tablet (40 mg total) by mouth 2 (two) times daily. 12/19/23 02/17/24 Yes Shahmehdi, Seyed A, MD  phentermine (ADIPEX-P) 37.5 MG tablet Take 37.5 mg by mouth 3 (three) times a week. 12/07/23  Yes [provider]  rosuvastatin (CRESTOR) 20 MG tablet Take 1 tablet by mouth daily. 06/08/22  Yes [provider]  sucralfate (CARAFATE) 1 g tablet Take 1 tablet (1 g total) by mouth 4 (four) times daily. 12/19/23 01/18/24 Yes Shahmehdi, Gemma Payor, MD  venlafaxine XR (EFFEXOR-XR) 150 MG 24 hr capsule Take 1 capsule (150 mg total) by mouth every morning. 07/12/23 07/11/24 Yes Plovsky, Earvin Hansen, MD  oxyCODONE (OXY IR/ROXICODONE) 5 MG immediate release tablet Take 1 tablet (5 mg total) by mouth every 6 (six) hours as needed for moderate pain (pain score 4-6). 12/15/23   Catarina Hartshorn, MD     Allergies:     Allergies  Allergen Reactions   Droperidol Palpitations and Other (See Comments)    Elevates BP increases HR   Ondansetron Hives, Swelling and Rash    Spots around IV site IV Zofran    Benzonatate Rash and Other (See Comments)    Caused burning pain in eyes  Watery eyes  Vision changes  Conjunctivitis   Bromfed Dm [Pseudoeph-Bromphen-Dm] Anxiety   Diclofenac Sodium Rash   Flexeril [Cyclobenzaprine] Anxiety     Physical Exam:   Vitals  Blood pressure (!) 112/90, pulse 72, temperature 97.7 F (36.5 C), temperature source Oral, resp. rate 18, height 5\' 4"  (1.626 m), weight 83.6 kg, SpO2 100%.   1. General Developed female, laying in bed, no apparent distress  2. Normal affect and insight, Not Suicidal or Homicidal, Awake Alert, Oriented X 3.  3. No F.N deficits, ALL C.Nerves Intact, Strength 5/5 all 4 extremities, Sensation intact all 4 extremities, Plantars down going.  4. Ears and Eyes appear Normal, Conjunctivae clear, PERRLA. Moist Oral Mucosa.  5. Supple Neck, No JVD, No cervical lymphadenopathy appriciated, No Carotid Bruits.  6. Symmetrical Chest wall movement, Good air movement bilaterally, CTAB.  7. RRR, No Gallops, Rubs or Murmurs, No Parasternal Heave.  8. Positive Bowel Sounds, Abdomen Soft, No tenderness, No organomegaly appriciated,No rebound -guarding or rigidity.  9.  No Cyanosis, Normal Skin Turgor, No Skin Rash or Bruise.  10. Good muscle tone,  joints appear normal , no effusions, Normal ROM.     Data Review:    CBC Recent Labs  Lab 12/15/23 0448 12/19/23 0059 12/20/23 0415 12/21/23 1429  WBC 5.0 4.7 3.3* 3.7*  HGB 12.2 11.2* 9.4* 9.4*  HCT 38.8 34.4* 30.0* 29.3*  PLT 264 252 206 212  MCV 93.9 93.2 96.2 93.9  MCH 29.5 30.4 30.1 30.1  MCHC 31.4 32.6 31.3 32.1  RDW 14.6 14.6 14.7 14.3   ------------------------------------------------------------------------------------------------------------------  Chemistries  Recent Labs  Lab 12/19/23 0059 12/20/23 0415 12/21/23 1429  NA 139 137 138  K 3.6 3.5 3.0*  CL 104 106 103  CO2 25 25 26   GLUCOSE 101* 101* 84  BUN  31* 18 20  CREATININE 0.83 0.61 0.96  CALCIUM 9.2 8.5*  9.6  MG 2.3  --   --   AST 20  --  25  ALT 18  --  20  ALKPHOS 83  --  98  BILITOT 0.4  --  0.4   ------------------------------------------------------------------------------------------------------------------ estimated creatinine clearance is 69.3 mL/min (by C-G formula based on SCr of 0.96 mg/dL). ------------------------------------------------------------------------------------------------------------------ No results for input(s): "TSH", "T4TOTAL", "T3FREE", "THYROIDAB" in the last 72 hours.  Invalid input(s): "FREET3"  Coagulation profile Recent Labs  Lab 12/19/23 0059 12/20/23 0415  INR 1.0 1.1   ------------------------------------------------------------------------------------------------------------------- No results for input(s): "DDIMER" in the last 72 hours. -------------------------------------------------------------------------------------------------------------------  Cardiac Enzymes No results for input(s): "CKMB", "TROPONINI", "MYOGLOBIN" in the last 168 hours.  Invalid input(s): "CK" ------------------------------------------------------------------------------------------------------------------ No results found for: "BNP"   ---------------------------------------------------------------------------------------------------------------  Urinalysis    Component Value Date/Time   COLORURINE COLORLESS (A) 10/26/2023 0802   APPEARANCEUR HAZY (A) 10/26/2023 0802   LABSPEC 1.009 10/26/2023 0802   PHURINE 6.0 10/26/2023 0802   GLUCOSEU NEGATIVE 10/26/2023 0802   HGBUR NEGATIVE 10/26/2023 0802   BILIRUBINUR NEGATIVE 10/26/2023 0802   KETONESUR NEGATIVE 10/26/2023 0802   PROTEINUR NEGATIVE 10/26/2023 0802   UROBILINOGEN 0.2 07/05/2015 0627   NITRITE NEGATIVE 10/26/2023 0802   LEUKOCYTESUR NEGATIVE 10/26/2023 0802    ----------------------------------------------------------------------------------------------------------------   Imaging Results:     No results found.     Assessment & Plan:    Principal Problem:   Hematemesis Active Problems:   Anxiety   Depression   Bullous pemphigoid   Chronic headaches   Angiectasia of gastrointestinal tract   Acute gastric ulcer with hemorrhage  Abdominal pain, hematemesis and bright red blood per rectum - Agement per GI, plan for colonoscopy tomorrow. - Send CT abdomen pelvis GI bleeding protocol has been negative which is reassuring - Continue with IV Protonix - Continue with Carafate - Check gastrin level - Tensional for capsule if colonoscopy is negative - Continue with as needed nausea and pain medications   Depression/anxiety -Continue home dose diazepam and venlafaxine   Chronic pain syndrome -Continue gabapentin   Multinodular goiter -11/24/2023 thyroid biopsy--benign follicular nodule   History of bullous pemphigoid -Now off of any immunomodulators and DMA RD's-outpatient follow-up with her dermatologist at [facility] as above for further management   Obesity Body mass index is 31.64 kg/m.   DVT Prophylaxis SCDs   AM Labs Ordered, also please review Full Orders  Family Communication: Admission, patients condition and plan of care including tests being ordered have been discussed with the patient  who indicate understanding and agree with the plan and Code Status.  Code Status full code  Likely DC to home home   Consults called: GI consulted  Admission status: Observation  Time spent in minutes : 60 minutes   Huey Bienenstock M.D on 12/21/2023 at 9:42 PM   [facility] - Office  [phone]

## 2023-12-21 NOTE — Progress Notes (Addendum)
 Gastroenterology Progress Note   Referring Provider: No ref. provider found Primary Care Physician:  Randel Pigg, Dorma Russell, MD Primary Gastroenterologist: Vista Lawman, MD  Patient ID: Sabrina [name]; [id number]; 1966/09/12   Subjective   Continues to have severe abdominal cramping.  Having hematemesis as well as rectal bleeding.  Reports 4 episodes of rectal bleeding this morning, and at least one episode of hematemesis.  Nothing to eat since this past Saturday.  Reported to nursing that she has been unable to take her Carafate at home.  Objective   Vital signs in last 24 hours Temp:  [98.2 F (36.8 C)] 98.2 F (36.8 C) (04/10 1302) Pulse Rate:  [77-91] 78 (04/10 1430) Resp:  [12-16] 13 (04/10 1430) BP: (93-101)/(64-73) 99/69 (04/10 1430) SpO2:  [98 %-100 %] 100 % (04/10 1430)    Physical Exam General:   Alert and oriented, pleasant Head:  Normocephalic and atraumatic. Eyes:  No icterus, sclera clear. Conjuctiva pink.  Mouth:  Without lesions, mucosa pink and moist.  Neck:  Supple, without thyromegaly or masses.  Abdomen:  Bowel sounds present, soft, non-distended. Ttp throughout abdomen. No HSM or hernias noted. No rebound or guarding. No masses appreciated  Neurologic:  Alert and  oriented x4;  grossly normal neurologically. Psych:  Alert and cooperative. Normal mood and affect.  Intake/Output from previous day: No intake/output data recorded. Intake/Output this shift: No intake/output data recorded.  Lab Results  Recent Labs    12/19/23 0059 12/20/23 0415 12/21/23 1429  WBC 4.7 3.3* 3.7*  HGB 11.2* 9.4* 9.4*  HCT 34.4* 30.0* 29.3*  PLT 252 206 212   BMET Recent Labs    12/19/23 0059 12/20/23 0415 12/21/23 1429  NA 139 137 138  K 3.6 3.5 3.0*  CL 104 106 103  CO2 25 25 26   GLUCOSE 101* 101* 84  BUN 31* 18 20  CREATININE 0.83 0.61 0.96  CALCIUM 9.2 8.5* 9.6   LFT Recent Labs    12/19/23 0059 12/21/23 1429  PROT 6.8 7.3  ALBUMIN 3.8  4.1  AST 20 25  ALT 18 20  ALKPHOS 83 98  BILITOT 0.4 0.4   PT/INR Recent Labs    12/19/23 0059 12/20/23 0415  LABPROT 13.0 14.2  INR 1.0 1.1   Hepatitis Panel No results for input(s): "HEPBSAG", "HCVAB", "HEPAIGM", "HEPBIGM" in the last 72 hours.  Studies/Results ECHOCARDIOGRAM COMPLETE Result Date: 12/15/2023    ECHOCARDIOGRAM REPORT   Patient Name:   Sabrina [name] Date of Exam: 12/15/2023 Medical Rec #:  [id number]     Height:       64.0 in Accession #:    [id number]    Weight:       177.0 lb Date of Birth:  September 28, 1965    BSA:          1.858 m Patient Age:    57 years      BP:           102/76 mmHg Patient Gender: F             HR:           100 bpm. [facility] Procedure: 2D Echo, Cardiac Doppler and Color Doppler (Both Spectral and Color            Flow Doppler were utilized during procedure). Indications:    Dyspnea R06.00  History:        Patient has no prior history of Echocardiogram examinations.  Arrythmias:Tachycardia; Risk Factors:Dyslipidemia.  Sonographer:    Celesta Gentile RCS Referring Phys: [phone] DAVID TAT IMPRESSIONS  1. Left ventricular ejection fraction, by estimation, is 55 to 60%. The left ventricle has normal function. The left ventricle has no regional wall motion abnormalities. Left ventricular diastolic parameters are consistent with Grade I diastolic dysfunction (impaired relaxation).  2. Right ventricular systolic function is normal. The right ventricular size is normal.  3. The mitral valve is normal in structure. No evidence of mitral valve regurgitation. No evidence of mitral stenosis.  4. The aortic valve is tricuspid. Aortic valve regurgitation is not visualized. No aortic stenosis is present.  5. The inferior vena cava is normal in size with greater than 50% respiratory variability, suggesting right atrial pressure of 3 mmHg. FINDINGS  Left Ventricle: Left ventricular ejection fraction, by estimation, is 55 to 60%. The left ventricle has  normal function. The left ventricle has no regional wall motion abnormalities. The left ventricular internal cavity size was normal in size. There is  no left ventricular hypertrophy. Left ventricular diastolic parameters are consistent with Grade I diastolic dysfunction (impaired relaxation). Normal left ventricular filling pressure. Right Ventricle: The right ventricular size is normal. Right vetricular wall thickness was not well visualized. Right ventricular systolic function is normal. Left Atrium: Left atrial size was normal in size. Right Atrium: Right atrial size was normal in size. Pericardium: There is no evidence of pericardial effusion. Mitral Valve: The mitral valve is normal in structure. No evidence of mitral valve regurgitation. No evidence of mitral valve stenosis. Tricuspid Valve: The tricuspid valve is normal in structure. Tricuspid valve regurgitation is not demonstrated. No evidence of tricuspid stenosis. Aortic Valve: The aortic valve is tricuspid. Aortic valve regurgitation is not visualized. No aortic stenosis is present. Aortic valve mean gradient measures 2.4 mmHg. Aortic valve peak gradient measures 5.1 mmHg. Aortic valve area, by VTI measures 1.95 cm. Pulmonic Valve: The pulmonic valve was not well visualized. Pulmonic valve regurgitation is not visualized. No evidence of pulmonic stenosis. Aorta: The aortic root is normal in size and structure. Venous: The inferior vena cava is normal in size with greater than 50% respiratory variability, suggesting right atrial pressure of 3 mmHg. IAS/Shunts: The interatrial septum is aneurysmal. No atrial level shunt detected by color flow Doppler.  LEFT VENTRICLE PLAX 2D LVIDd:         4.80 cm   Diastology LVIDs:         3.10 cm   LV e' medial:    6.09 cm/s LV PW:         0.80 cm   LV E/e' medial:  5.9 LV IVS:        0.90 cm   LV e' lateral:   9.14 cm/s LVOT diam:     1.70 cm   LV E/e' lateral: 3.9 LV SV:         38 LV SV Index:   21 LVOT Area:      2.27 cm  RIGHT VENTRICLE RV S prime:     11.40 cm/s TAPSE (M-mode): 1.2 cm LEFT ATRIUM             Index        RIGHT ATRIUM           Index LA diam:        3.70 cm 1.99 cm/m   RA Area:     14.60 cm LA Vol (A2C):   53.1 ml 28.59 ml/m  RA Volume:  38.70 ml  20.83 ml/m LA Vol (A4C):   37.3 ml 20.08 ml/m LA Biplane Vol: 47.3 ml 25.46 ml/m  AORTIC VALVE AV Area (Vmax):    1.84 cm AV Area (Vmean):   2.07 cm AV Area (VTI):     1.95 cm AV Vmax:           113.17 cm/s AV Vmean:          72.420 cm/s AV VTI:            0.196 m AV Peak Grad:      5.1 mmHg AV Mean Grad:      2.4 mmHg LVOT Vmax:         91.80 cm/s LVOT Vmean:        66.000 cm/s LVOT VTI:          0.169 m LVOT/AV VTI ratio: 0.86  AORTA Ao Root diam: 3.20 cm MITRAL VALVE MV Area (PHT): 2.32 cm    SHUNTS MV Decel Time: 327 msec    Systemic VTI:  0.17 m MV E velocity: 36.10 cm/s  Systemic Diam: 1.70 cm MV A velocity: 66.40 cm/s MV E/A ratio:  0.54 Dina Rich MD Electronically signed by Dina Rich MD Signature Date/Time: 12/15/2023/2:12:54 PM    Final    US Abdomen Limited RUQ (LIVER/GB) Result Date: 12/14/2023 CLINICAL DATA:  Cirrhosis EXAM: ULTRASOUND ABDOMEN LIMITED RIGHT UPPER QUADRANT COMPARISON:  Ultrasound 11/03/2023.  CT 10/26/2023. FINDINGS: Gallbladder: Prior cholecystectomy Common bile duct: Diameter: 6 mm Liver: Homogeneous hepatic parenchyma. Multiple cystic areas are identified as on previous examination. There is a large area anteriorly measuring 7 cm. Smaller foci also identified. A few these have multiple septa. Portal vein is patent on color Doppler imaging with normal direction of blood flow towards the liver. Other: None. IMPRESSION: Prior cholecystectomy. No ductal dilatation. Numerous hepatic cysts are present, some of which appear mildly complex by ultrasound. Please correlate with prior CT scan. Electronically Signed   By: Karen Kays M.D.   On: 12/14/2023 16:26   [place] FNA BX THYROID 1ST LESION AFIRMA Result Date:  11/24/2023 INDICATION: Indeterminate thyroid nodule EXAM: ULTRASOUND GUIDED FINE NEEDLE ASPIRATION OF INDETERMINATE THYROID NODULE COMPARISON:  US Thyroid dated November 03, 2023 MEDICATIONS: Lidocaine 1% - 4 mls COMPLICATIONS: None immediate. TECHNIQUE: Informed written consent was obtained from the patient after a discussion of the risks, benefits and alternatives to treatment. Questions regarding the procedure were encouraged and answered. A timeout was performed prior to the initiation of the procedure. Pre-procedural ultrasound scanning demonstrated unchanged size and appearance of the indeterminate nodule within the inferior aspect of the left thyroid The procedure was planned. The neck was prepped in the usual sterile fashion, and a sterile drape was applied covering the operative field. A timeout was performed prior to the initiation of the procedure. Local anesthesia was provided with 1% lidocaine. Under direct ultrasound guidance, 5 FNA biopsies were performed of the nodule located in the inferior aspecr of the left thyroid with a 25 gauge needle. Two samples were sent to [facility] per ordering MD. Multiple ultrasound images were saved for procedural documentation purposes. The samples were prepared and submitted to pathology. Limited post procedural scanning was negative for hematoma or additional complication. Dressings were placed. The patient tolerated the above procedures procedure well without immediate postprocedural complication. FINDINGS: Nodule reference number based on prior diagnostic ultrasound: 5 Maximum size: 1.6 cm Location: Left; Inferior ACR TI-RADS risk category: 3 Reason for biopsy: meets ACR TI-RADS criteria Ultrasound imaging  confirms appropriate placement of the needles within the thyroid nodule. IMPRESSION: Technically successful ultrasound guided fine needle aspiration of nodule located in the inferior aspect of the left thyroid. Performed by Anders Grant NP Electronically  Signed   By: Marliss Coots M.D.   On: 11/24/2023 11:18    Assessment  59 y.o. 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  Hematemesis, rectal bleeding, gastric and duodenal angiectasias, abdominal pain: - Hgb 12 few days prior and now rending down (11.2>>9.4). - EGD February with esophagitis and gastritis - EGD 4/3 - normal esophagus.  Gastritis, 13 bleeding angiectasia's in the stomach with APC, single nonbleeding angiectasia in the duodenum treated with APC therapy. - EGD 4/8 - 1cm hiatal hernia, 8 cratered gastric ulcers (two oozing), possibly post ablative ulcers (injected with epi and clips placed) - CTA 4/8 with hemostasis clips present within the stomach, no evidence of active GI bleed.  - RUQ [place] 4/3 with numerous hepatic cyst which appear mildly complex on ultrasound, however CT suggested simple cyst  Given CTA negative and ongoing BRBPR we will evaluate with colonoscopy tomorrow. Capsule if colonoscopy negative.   Plan / Recommendations  Gastrin level PPI BID Carafate 1g QID Clear liquids Colonoscopy tomorrow with Dr. [name] Potential for capsule if colonoscopy negative Avoid NSAIDs Pain and antiemetics per hospitalist.     LOS: 0 days    12/21/2023, 3:14 PM   Brooke Bonito, MSN, FNP-BC, AGACNP-BC [facility]  I have reviewed the note and agree with the APP's assessment as described in this progress note  Patient presenting with recurrent episodes of hematemesis and some rectal bleeding, along with abdominal pain.  Unclear why the patient was discharged from the hospital pending final results of imaging.  Our gastroenterology service was following her case  until yesterday when she was discharged from the hospital.  Received results of imaging which was negative for any acute intra-abdominal abnormalities or abnormalities concerning for vasculitis or active GI bleeding.  Will proceed with a colonoscopy to evaluate for her bleeding episodes.  If negative, may need to proceed with a capsule endoscopy and possible bleeding scan.  Will need to keep n.p.o. after midnight, okay to take clear liquids for now.  Katrinka Blazing, MD Gastroenterology and Hepatology [facility]

## 2023-12-21 NOTE — ED Triage Notes (Signed)
 Pt c/o continued bloody stools and vomiting blood for the past "almost 2 months". Pt was discharged Friday from the hospital for the bloody stool and hematemesis. Pt states they have placed clips in her abd previously for GI related reasons. Rates pian 8110 abd, "the entire stomach"

## 2023-12-21 NOTE — Progress Notes (Signed)
   12/21/23 2251  TOC Brief Assessment  Insurance and Status Reviewed  Patient has primary care physician Yes  Home environment has been reviewed From home  Prior level of function: Independent  Prior/Current Home Services No current home services  Social Drivers of Health Review SDOH reviewed no interventions necessary  Readmission risk has been reviewed Yes  Transition of care needs no transition of care needs at this time   Transition of Care Department [facility]) has reviewed patient and no other TOC needs have been identified at this time. We will continue to monitor patient advancement through interdisciplinary progression rounds. If new patient needs arise, please place a TOC consult.

## 2023-12-21 NOTE — ED Notes (Signed)
 ED TO INPATIENT HANDOFF REPORT  ED Nurse Name and Phone #: Alice Reichert [phone]  S Name/Age/Gender Sabrina [name] 58 y.o. female Room/Bed: APA12/APA12  Code Status   Code Status: Prior  Home/SNF/Other Home Patient oriented to: self, place, time, and situation Is this baseline? Yes   Triage Complete: Triage complete  Chief Complaint Hematemesis [K92.0]  Triage Note Pt c/o continued bloody stools and vomiting blood for the past "almost 2 months". Pt was discharged Friday from the hospital for the bloody stool and hematemesis. Pt states they have placed clips in her abd previously for GI related reasons. Rates pian 8110 abd, "the entire stomach"       Allergies Allergies  Allergen Reactions   Droperidol Palpitations and Other (See Comments)    Elevates BP increases HR   Ondansetron Hives, Swelling and Rash    Spots around IV site IV Zofran   Benzonatate Rash and Other (See Comments)    Caused burning pain in eyes  Watery eyes  Vision changes  Conjunctivitis   Bromfed Dm [Pseudoeph-Bromphen-Dm] Anxiety   Diclofenac Sodium Rash   Flexeril [Cyclobenzaprine] Anxiety    Level of Care/Admitting Diagnosis ED Disposition     ED Disposition  Admit   Condition  --   Comment  Hospital Area: [facility] [[id number]]  Level of Care: Telemetry [5]  Covid Evaluation: Asymptomatic - no recent exposure (last 10 days) testing not required  Diagnosis: Hematemesis [578.0.ICD-9-CM]  Admitting Physician: Chiquita Loth  Attending Physician: Randol Kern, DAWOOD S [[id number]]          B Medical/Surgery History 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HCC)    Poisoning, snake  bite 04/08/2016   "[place]; RUE"   PONV (postoperative nausea and vomiting)    S/P right knee surgery    Situational anxiety    Staph infection ~ 2015   "left elbow and finger"   Thyroid mass    Past Surgical History:  Procedure Laterality Date   APPENDECTOMY  ~ 1987   APPLICATION OF A-CELL OF EXTREMITY Left 08/05/2015   Procedure: APPLICATION OF A-CELL OF EXTREMITY;  Surgeon: Peggye Form, DO;  Location: [facility];  Service: Plastics;  Laterality: Left;   BIOPSY  10/26/2023   Procedure: BIOPSY;  Surgeon: [name], MD;  [facility];  Service: Endoscopy;;   BREAST SURGERY Right 1990   "milk duct taken out"   CHONDROPLASTY Right 02/19/2019   Procedure: CHONDROPLASTY; EXCISION EXOSTOSIS;  Surgeon: Valeria Batman, MD;  Location: [facility];  Service: Orthopedics;  Laterality: Right;   DEBRIDEMENT AND CLOSURE WOUND Left 07/01/2015   Procedure: LEFT ELBOW EXCISION OF WOUND WITH PRIMARY CLOSURE 2X5 CM ;  Surgeon: Peggye Form, DO;  Location: [facility];  Service: Plastics;  Laterality: Left;   ELBOW SURGERY Left X 23 in [place] <06/2015   from a cat bite; all I&D   ESOPHAGOGASTRODUODENOSCOPY N/A 12/14/2023   Procedure: EGD (ESOPHAGOGASTRODUODENOSCOPY);  Surgeon: [name], MD;  [facility];  Service: Endoscopy;  Laterality: N/A;   ESOPHAGOGASTRODUODENOSCOPY N/A 12/19/2023   Procedure: EGD (ESOPHAGOGASTRODUODENOSCOPY);  Surgeon: Marguerita Merles, Reuel Boom, MD;  [facility];  Service: Gastroenterology;  Laterality: N/A;   ESOPHAGOGASTRODUODENOSCOPY (EGD) WITH PROPOFOL N/A 10/26/2023   Procedure: ESOPHAGOGASTRODUODENOSCOPY (EGD) WITH PROPOFOL;  Surgeon: [name], MD;  [facility];  Service: Endoscopy;  Laterality: N/A;   HOT HEMOSTASIS  12/14/2023   Procedure: EGD, WITH ARGON PLASMA COAGULATION;  Surgeon: [name], MD;  [facility];  Service:  Endoscopy;;   I & D EXTREMITY Left 07/08/2015   Procedure: IRRIGATION AND DEBRIDEMENT EXTREMITY, DRAINAGE OF LEFT ARM WOUND, A-CELL PLACEMENT, WOUND VAC PLACEMENT;  Surgeon: Alena Bills Dillingham, DO;  Location: [facility];  Service: Plastics;  Laterality: Left;   INCISION AND DRAINAGE OF WOUND Left 08/05/2015   Procedure: IRRIGATION AND DEBRIDEMENT LEFT ELBOW WOUND, PLACEMENT OF ACELL;  Surgeon: Peggye Form, DO;  Location: [facility];  Service: Plastics;  Laterality: Left;   KNEE ARTHROSCOPY WITH MEDIAL MENISECTOMY Right 02/19/2019   Procedure: RIGHT KNEE ARTHROSCOPY, DEBRIDEMENT, PARTIAL MEDIAL AND LATERAL MENISECTOMY;  Surgeon: Valeria Batman, MD;  Location: [facility];  Service: Orthopedics;  Laterality: Right;   LAPAROSCOPIC CHOLECYSTECTOMY  1998   SKIN GRAFT Left 2016   took from anterior thigh; placed at elbow   TONSILLECTOMY  ~ 2000   TOTAL ABDOMINAL HYSTERECTOMY  2003   WRIST SURGERY Right 01/2016     A IV Location/Drains/Wounds Patient Lines/Drains/Airways Status     Active Line/Drains/Airways     Name Placement date Placement time Site Days   Peripheral IV 12/21/23 20 G Anterior;Left;Proximal Forearm 12/21/23  1434  Forearm  less than 1            Intake/Output Last 24 hours No intake or output data in the 24 hours ending 12/21/23 1605  Labs/Imaging Results for orders placed or performed during the hospital encounter of 12/21/23 (from the past 48 hours)  POC occult blood, ED     Status: Normal   Collection Time: 12/21/23  1:59 PM  Result Value Ref Range   Occult Blood    Comprehensive metabolic panel     Status: Abnormal   Collection Time: 12/21/23  2:29 PM  Result Value Ref Range   Sodium 138 135 - 145 mmol/L   Potassium 3.0 (L) 3.5 - 5.1 mmol/L   Chloride 103 98 - 111 mmol/L   CO2 26 22 - 32 mmol/L   Glucose, Bld 84 70 - 99 mg/dL    Comment: Glucose reference range applies only to samples taken after fasting for at least 8  hours.   BUN 20 6 - 20 mg/dL   Creatinine, Ser 1.61 0.44 - 1.00 mg/dL   Calcium 9.6 8.9 - 09.6 mg/dL   Total Protein 7.3 6.5 - 8.1 g/dL   Albumin 4.1 3.5 - 5.0 g/dL   AST 25 15 - 41 U/L   ALT 20 0 - 44 U/L   Alkaline Phosphatase 98 38 - 126 U/L   Total Bilirubin 0.4 0.0 - 1.2 mg/dL   GFR, Estimated >04 >54 mL/min    Comment: (NOTE) Calculated using the CKD-EPI Creatinine Equation (2021)    Anion gap 9 5 - 15    Comment: Performed at [facility], [address], [name], [place] [address]  CBC     Status: Abnormal   Collection Time: 12/21/23  2:29 PM  Result Value Ref Range   WBC 3.7 (L) 4.0 - 10.5 K/uL   RBC 3.12 (L) 3.87 - 5.11 MIL/uL  Hemoglobin 9.4 (L) 12.0 - 15.0 g/dL   HCT 53.6 (L) 64.4 - 03.4 %   MCV 93.9 80.0 - 100.0 fL   MCH 30.1 26.0 - 34.0 pg   MCHC 32.1 30.0 - 36.0 g/dL   RDW 74.2 59.5 - 63.8 %   Platelets 212 150 - 400 K/uL   nRBC 0.0 0.0 - 0.2 %    Comment: Performed at [facility], [address] [place], [place] [address]  Type and screen [facility]     Status: None   Collection Time: 12/21/23  2:29 PM  Result Value Ref Range   ABO/RH(D) A POS    Antibody Screen NEG    Sample Expiration      12/24/2023,2359 Performed at [facility], [address] [place], [place] [address]   POC occult blood, ED RN will collect     Status: None   Collection Time: 12/21/23  3:22 PM  Result Value Ref Range   Occult Blood NEGATIVE     Comment: NEGATIVE   No results found.  Pending Labs Unresulted Labs (From admission, onward)    None       Vitals/Pain Today's Vitals   12/21/23 1345 12/21/23 1400 12/21/23 1430 12/21/23 1515  BP: 98/73 101/70 99/69 100/74  Pulse: 82 77 78 69  Resp: 12 12 13 14   Temp:      TempSrc:      SpO2: 98% 100% 100% 100%  PainSc:        Isolation Precautions No active isolations  Medications Medications  HYDROmorphone (DILAUDID) injection 0.5 mg (has no administration in time range)  pantoprazole (PROTONIX)  injection 40 mg (40 mg Intravenous Given 12/21/23 1444)  sodium chloride 0.9 % bolus 1,000 mL (1,000 mLs Intravenous New Bag/Given 12/21/23 1442)    Mobility walks     Focused Assessments Patient her for reported rectal bleeding, bright red; and hematemesis also bright red. Stool card negative. No active bleeding at this time. NPO after midnight for colonscopy tomorrow.    R Recommendations: See Admitting Provider Note  Report given to:   Additional Notes: Pt A/O x4, ambulatory.

## 2023-12-21 NOTE — Telephone Encounter (Signed)
 Thank you Crystal for the correct suggestion

## 2023-12-21 NOTE — ED Notes (Signed)
 Pt lethargic at this time and snoring. Pain meds held at this time.

## 2023-12-21 NOTE — ED Provider Notes (Signed)
 [place] EMERGENCY DEPARTMENT AT [facility] Provider Note   CSN: [id number] Arrival date & time: 12/21/23  1252     History  Chief Complaint  Patient presents with   Hematemesis    Sabrina [name] is a 58 y.o. female.  She has a history of GI bleed and was just discharged after day after being admitted for upper GI bleeding.  She said since she has been home her abdominal pain has worsened and she has had vomiting associated with some blood.  She has also been passing some bright red blood per rectum.  She denies any diarrhea.  She said the vomiting of blood in the rectal bleeding have been ongoing for a few months.  Similar with the abdominal pain.  She had endoscopy and clips placed during her last admission.  She feels weak and has not been able to eat much.  She is not on a blood thinner.  The history is provided by the patient.  Abdominal Pain Pain location:  Generalized Pain quality: aching   Pain severity:  Severe Onset quality:  Unable to specify Timing:  Constant Progression:  Unchanged Chronicity:  Recurrent Relieved by:  Nothing Associated symptoms: fatigue, hematemesis, hematochezia, nausea and vomiting   Associated symptoms: no chest pain, no constipation, no cough, no diarrhea, no dysuria, no fever, no hematuria and no shortness of breath        Home Medications Prior to Admission medications   Medication Sig Start Date End Date Taking? Authorizing Provider  acetaminophen (TYLENOL) 325 MG tablet Take 325-650 mg by mouth every 6 (six) hours as needed for mild pain (pain score 1-3).    [provider]  bisacodyl (DULCOLAX) 5 MG EC tablet Take 1 tablet (5 mg total) by mouth daily as needed for moderate constipation. 12/20/23   Shahmehdi, Gemma Payor, MD  butalbital-acetaminophen-caffeine (FIORICET) [phone] MG tablet Take 1 tablet by mouth daily as needed for headache or migraine. 11/19/23   [provider]  chlorpheniramine-HYDROcodone  (TUSSIONEX) 10-8 MG/5ML Take 5 mLs by mouth every 12 (twelve) hours as needed for cough. 12/09/23   [provider]  diazepam (VALIUM) 10 MG tablet 1 qam  2 qhs Patient taking differently: Take 10 mg by mouth in the morning and at bedtime. 10 mg pm, 20 mg pm 12/12/23   Plovsky, Earvin Hansen, MD  FERREX 150 150 MG capsule Take 150 mg by mouth daily.    [provider]  gabapentin (NEURONTIN) 300 MG capsule Take 2 capsules (600 mg total) by mouth 3 (three) times daily. 08/10/21   Philip Aspen, Limmie Patricia, MD  oxyCODONE (OXY IR/ROXICODONE) 5 MG immediate release tablet Take 1 tablet (5 mg total) by mouth every 6 (six) hours as needed for moderate pain (pain score 4-6). 12/15/23   Catarina Hartshorn, MD  pantoprazole (PROTONIX) 40 MG tablet Take 1 tablet (40 mg total) by mouth 2 (two) times daily. 12/19/23 02/17/24  Kendell Bane, MD  phentermine (ADIPEX-P) 37.5 MG tablet Take 37.5 mg by mouth 3 (three) times a week. 12/07/23   [provider]  rosuvastatin (CRESTOR) 20 MG tablet Take 1 tablet by mouth daily. 06/08/22   [provider]  sucralfate (CARAFATE) 1 g tablet Take 1 tablet (1 g total) by mouth 4 (four) times daily. 12/19/23 01/18/24  Kendell Bane, MD  venlafaxine XR (EFFEXOR-XR) 150 MG 24 hr capsule Take 1 capsule (150 mg total) by mouth every morning. 07/12/23 07/11/24  Archer Asa, MD  Allergies    Droperidol, Ondansetron, Benzonatate, Bromfed dm [pseudoeph-bromphen-dm], Diclofenac sodium, and Flexeril [cyclobenzaprine]    Review of Systems   Review of Systems  Constitutional:  Positive for fatigue. Negative for fever.  Respiratory:  Negative for cough and shortness of breath.   Cardiovascular:  Negative for chest pain.  Gastrointestinal:  Positive for abdominal pain, hematemesis, hematochezia, nausea and vomiting. Negative for constipation and diarrhea.  Genitourinary:  Negative for dysuria and hematuria.    Physical Exam Updated Vital Signs BP 93/64    Pulse 91   Temp 98.2 F (36.8 C) (Oral)   Resp 16   SpO2 100%  Physical Exam Vitals and nursing note reviewed.  Constitutional:      General: She is not in acute distress.    Appearance: Normal appearance. She is well-developed.  HENT:     Head: Normocephalic and atraumatic.  Eyes:     Conjunctiva/sclera: Conjunctivae normal.  Cardiovascular:     Rate and Rhythm: Normal rate and regular rhythm.     Heart sounds: No murmur heard. Pulmonary:     Effort: Pulmonary effort is normal. No respiratory distress.     Breath sounds: Normal breath sounds.  Abdominal:     Palpations: Abdomen is soft.     Tenderness: There is abdominal tenderness (diffuse). There is no guarding or rebound.  Genitourinary:    Comments: Rectal exam done with Bonita Quin as chaperone.  Normal tone no masses.  Guaiac negative. Musculoskeletal:        General: No swelling.     Cervical back: Neck supple.  Skin:    General: Skin is warm and dry.     Capillary Refill: Capillary refill takes less than 2 seconds.  Neurological:     General: No focal deficit present.     Mental Status: She is alert.     ED Results / Procedures / Treatments   Labs (all labs ordered are listed, but only abnormal results are displayed) Labs Reviewed  COMPREHENSIVE METABOLIC PANEL WITH GFR - Abnormal; Notable for the following components:      Result Value   Potassium 3.0 (*)    All other components within normal limits  CBC - Abnormal; Notable for the following components:   WBC 3.7 (*)    RBC 3.12 (*)    Hemoglobin 9.4 (*)    HCT 29.3 (*)    All other components within normal limits  POC OCCULT BLOOD, ED - Normal  BASIC METABOLIC PANEL WITH GFR  CBC  POC OCCULT BLOOD, ED  TYPE AND SCREEN    EKG None  Radiology No results found.  Procedures Procedures    Medications Ordered in ED Medications  pantoprazole (PROTONIX) injection 40 mg (has no administration in time range)  HYDROmorphone (DILAUDID) injection 0.5 mg  (has no administration in time range)  sodium chloride 0.9 % bolus 1,000 mL (has no administration in time range)    ED Course/ Medical Decision Making/ A&P Clinical Course as of 12/21/23 1737  Thu Dec 21, 2023  1507 Discussed with GI Dr. Levon Hedger.  He said he is not sure why she got discharged yesterday.  Her CT had not been read and he was not planning for her to be discharged.  He is in agreement with getting her admitted again so that workup can continue.  I also reached out to radiology to see if they can expedite reading her CT angio that she had done 2 days ago. [MB]  1532 Discussed with [facility]  hospitalist Dr. Randol Kern who will evaluate for admission. [MB]    Clinical Course User Index [MB] Terrilee Files, MD                                 Medical Decision Making Amount and/or Complexity of Data Reviewed Labs: ordered.  Risk Prescription drug management. Decision regarding hospitalization.   This patient complains of vomiting blood and rectal bleeding, abdominal pain; this involves an extensive number of treatment Options and is a complaint that carries with it a high risk of complications and morbidity. The differential includes gastritis, peptic ulcer disease, upper GI bleed, lower GI bleed, symptomatic anemia, ischemic gut  I ordered, reviewed and interpreted labs, which included CBC with low white count, hemoglobin low but stable, chemistries unremarkable, fecal occult negative I ordered medication IV fluids and PPI and reviewed PMP when indicated. Previous records obtained and reviewed in epic including recent GI workup and discharge summary I consulted GI Dr. Levon Hedger and [facility] hospitalist Dr. Randol Kern I and discussed lab and imaging findings and discussed disposition.  Cardiac monitoring reviewed, sinus rhythm Social determinants considered, financial issues stress physically inactive Critical Interventions: None  After the interventions stated above, I  reevaluated the patient and found patient to be resting comfortably and soft but stable blood pressures Admission and further testing considered, GI is recommending admission so they can perform colonoscopy.  Patient in agreement with plan for admission.         Final Clinical Impression(s) / ED Diagnoses Final diagnoses:  Gastrointestinal hemorrhage, unspecified gastrointestinal hemorrhage type  Generalized abdominal pain    Rx / DC Orders ED Discharge Orders     None         Terrilee Files, MD 12/21/23 1740

## 2023-12-21 NOTE — ED Notes (Signed)
Blood occult negative

## 2023-12-21 NOTE — H&P (View-Only) (Signed)
 Gastroenterology Progress Note   Referring Provider: No ref. provider found Primary Care Physician:  Randel Pigg, Dorma Russell, MD Primary Gastroenterologist: Vista Lawman, MD  Patient ID: Sabrina [name]; [id number]; 1966/09/12   Subjective   Continues to have severe abdominal cramping.  Having hematemesis as well as rectal bleeding.  Reports 4 episodes of rectal bleeding this morning, and at least one episode of hematemesis.  Nothing to eat since this past Saturday.  Reported to nursing that she has been unable to take her Carafate at home.  Objective   Vital signs in last 24 hours Temp:  [98.2 F (36.8 C)] 98.2 F (36.8 C) (04/10 1302) Pulse Rate:  [77-91] 78 (04/10 1430) Resp:  [12-16] 13 (04/10 1430) BP: (93-101)/(64-73) 99/69 (04/10 1430) SpO2:  [98 %-100 %] 100 % (04/10 1430)    Physical Exam General:   Alert and oriented, pleasant Head:  Normocephalic and atraumatic. Eyes:  No icterus, sclera clear. Conjuctiva pink.  Mouth:  Without lesions, mucosa pink and moist.  Neck:  Supple, without thyromegaly or masses.  Abdomen:  Bowel sounds present, soft, non-distended. Ttp throughout abdomen. No HSM or hernias noted. No rebound or guarding. No masses appreciated  Neurologic:  Alert and  oriented x4;  grossly normal neurologically. Psych:  Alert and cooperative. Normal mood and affect.  Intake/Output from previous day: No intake/output data recorded. Intake/Output this shift: No intake/output data recorded.  Lab Results  Recent Labs    12/19/23 0059 12/20/23 0415 12/21/23 1429  WBC 4.7 3.3* 3.7*  HGB 11.2* 9.4* 9.4*  HCT 34.4* 30.0* 29.3*  PLT 252 206 212   BMET Recent Labs    12/19/23 0059 12/20/23 0415 12/21/23 1429  NA 139 137 138  K 3.6 3.5 3.0*  CL 104 106 103  CO2 25 25 26   GLUCOSE 101* 101* 84  BUN 31* 18 20  CREATININE 0.83 0.61 0.96  CALCIUM 9.2 8.5* 9.6   LFT Recent Labs    12/19/23 0059 12/21/23 1429  PROT 6.8 7.3  ALBUMIN 3.8  4.1  AST 20 25  ALT 18 20  ALKPHOS 83 98  BILITOT 0.4 0.4   PT/INR Recent Labs    12/19/23 0059 12/20/23 0415  LABPROT 13.0 14.2  INR 1.0 1.1   Hepatitis Panel No results for input(s): "HEPBSAG", "HCVAB", "HEPAIGM", "HEPBIGM" in the last 72 hours.  Studies/Results ECHOCARDIOGRAM COMPLETE Result Date: 12/15/2023    ECHOCARDIOGRAM REPORT   Patient Name:   [name] Date of Exam: 12/15/2023 Medical Rec #:  [id number]     Height:       64.0 in Accession #:    [id number]    Weight:       177.0 lb Date of Birth:  September 27, 1966    BSA:          1.858 m Patient Age:    58 years      BP:           102/76 mmHg Patient Gender: F             HR:           100 bpm. [facility] Procedure: 2D Echo, Cardiac Doppler and Color Doppler (Both Spectral and Color            Flow Doppler were utilized during procedure). Indications:    Dyspnea R06.00  History:        Patient has no prior history of Echocardiogram examinations.  Arrythmias:Tachycardia; Risk Factors:Dyslipidemia.  Sonographer:    Celesta Gentile RCS Referring Phys: [phone] DAVID TAT IMPRESSIONS  1. Left ventricular ejection fraction, by estimation, is 55 to 60%. The left ventricle has normal function. The left ventricle has no regional wall motion abnormalities. Left ventricular diastolic parameters are consistent with Grade I diastolic dysfunction (impaired relaxation).  2. Right ventricular systolic function is normal. The right ventricular size is normal.  3. The mitral valve is normal in structure. No evidence of mitral valve regurgitation. No evidence of mitral stenosis.  4. The aortic valve is tricuspid. Aortic valve regurgitation is not visualized. No aortic stenosis is present.  5. The inferior vena cava is normal in size with greater than 50% respiratory variability, suggesting right atrial pressure of 3 mmHg. FINDINGS  Left Ventricle: Left ventricular ejection fraction, by estimation, is 55 to 60%. The left ventricle has  normal function. The left ventricle has no regional wall motion abnormalities. The left ventricular internal cavity size was normal in size. There is  no left ventricular hypertrophy. Left ventricular diastolic parameters are consistent with Grade I diastolic dysfunction (impaired relaxation). Normal left ventricular filling pressure. Right Ventricle: The right ventricular size is normal. Right vetricular wall thickness was not well visualized. Right ventricular systolic function is normal. Left Atrium: Left atrial size was normal in size. Right Atrium: Right atrial size was normal in size. Pericardium: There is no evidence of pericardial effusion. Mitral Valve: The mitral valve is normal in structure. No evidence of mitral valve regurgitation. No evidence of mitral valve stenosis. Tricuspid Valve: The tricuspid valve is normal in structure. Tricuspid valve regurgitation is not demonstrated. No evidence of tricuspid stenosis. Aortic Valve: The aortic valve is tricuspid. Aortic valve regurgitation is not visualized. No aortic stenosis is present. Aortic valve mean gradient measures 2.4 mmHg. Aortic valve peak gradient measures 5.1 mmHg. Aortic valve area, by VTI measures 1.95 cm. Pulmonic Valve: The pulmonic valve was not well visualized. Pulmonic valve regurgitation is not visualized. No evidence of pulmonic stenosis. Aorta: The aortic root is normal in size and structure. Venous: The inferior vena cava is normal in size with greater than 50% respiratory variability, suggesting right atrial pressure of 3 mmHg. IAS/Shunts: The interatrial septum is aneurysmal. No atrial level shunt detected by color flow Doppler.  LEFT VENTRICLE PLAX 2D LVIDd:         4.80 cm   Diastology LVIDs:         3.10 cm   LV e' medial:    6.09 cm/s LV PW:         0.80 cm   LV E/e' medial:  5.9 LV IVS:        0.90 cm   LV e' lateral:   9.14 cm/s LVOT diam:     1.70 cm   LV E/e' lateral: 3.9 LV SV:         38 LV SV Index:   21 LVOT Area:      2.27 cm  RIGHT VENTRICLE RV S prime:     11.40 cm/s TAPSE (M-mode): 1.2 cm LEFT ATRIUM             Index        RIGHT ATRIUM           Index LA diam:        3.70 cm 1.99 cm/m   RA Area:     14.60 cm LA Vol (A2C):   53.1 ml 28.59 ml/m  RA Volume:  38.70 ml  20.83 ml/m LA Vol (A4C):   37.3 ml 20.08 ml/m LA Biplane Vol: 47.3 ml 25.46 ml/m  AORTIC VALVE AV Area (Vmax):    1.84 cm AV Area (Vmean):   2.07 cm AV Area (VTI):     1.95 cm AV Vmax:           113.17 cm/s AV Vmean:          72.420 cm/s AV VTI:            0.196 m AV Peak Grad:      5.1 mmHg AV Mean Grad:      2.4 mmHg LVOT Vmax:         91.80 cm/s LVOT Vmean:        66.000 cm/s LVOT VTI:          0.169 m LVOT/AV VTI ratio: 0.86  AORTA Ao Root diam: 3.20 cm MITRAL VALVE MV Area (PHT): 2.32 cm    SHUNTS MV Decel Time: 327 msec    Systemic VTI:  0.17 m MV E velocity: 36.10 cm/s  Systemic Diam: 1.70 cm MV A velocity: 66.40 cm/s MV E/A ratio:  0.54 Dina Rich MD Electronically signed by Dina Rich MD Signature Date/Time: 12/15/2023/2:12:54 PM    Final    US Abdomen Limited RUQ (LIVER/GB) Result Date: 12/14/2023 CLINICAL DATA:  Cirrhosis EXAM: ULTRASOUND ABDOMEN LIMITED RIGHT UPPER QUADRANT COMPARISON:  Ultrasound 11/03/2023.  CT 10/26/2023. FINDINGS: Gallbladder: Prior cholecystectomy Common bile duct: Diameter: 6 mm Liver: Homogeneous hepatic parenchyma. Multiple cystic areas are identified as on previous examination. There is a large area anteriorly measuring 7 cm. Smaller foci also identified. A few these have multiple septa. Portal vein is patent on color Doppler imaging with normal direction of blood flow towards the liver. Other: None. IMPRESSION: Prior cholecystectomy. No ductal dilatation. Numerous hepatic cysts are present, some of which appear mildly complex by ultrasound. Please correlate with prior CT scan. Electronically Signed   By: Karen Kays M.D.   On: 12/14/2023 16:26   [place] FNA BX THYROID 1ST LESION AFIRMA Result Date:  11/24/2023 INDICATION: Indeterminate thyroid nodule EXAM: ULTRASOUND GUIDED FINE NEEDLE ASPIRATION OF INDETERMINATE THYROID NODULE COMPARISON:  US Thyroid dated November 03, 2023 MEDICATIONS: Lidocaine 1% - 4 mls COMPLICATIONS: None immediate. TECHNIQUE: Informed written consent was obtained from the patient after a discussion of the risks, benefits and alternatives to treatment. Questions regarding the procedure were encouraged and answered. A timeout was performed prior to the initiation of the procedure. Pre-procedural ultrasound scanning demonstrated unchanged size and appearance of the indeterminate nodule within the inferior aspect of the left thyroid The procedure was planned. The neck was prepped in the usual sterile fashion, and a sterile drape was applied covering the operative field. A timeout was performed prior to the initiation of the procedure. Local anesthesia was provided with 1% lidocaine. Under direct ultrasound guidance, 5 FNA biopsies were performed of the nodule located in the inferior aspecr of the left thyroid with a 25 gauge needle. Two samples were sent to [facility] per ordering MD. Multiple ultrasound images were saved for procedural documentation purposes. The samples were prepared and submitted to pathology. Limited post procedural scanning was negative for hematoma or additional complication. Dressings were placed. The patient tolerated the above procedures procedure well without immediate postprocedural complication. FINDINGS: Nodule reference number based on prior diagnostic ultrasound: 5 Maximum size: 1.6 cm Location: Left; Inferior ACR TI-RADS risk category: 3 Reason for biopsy: meets ACR TI-RADS criteria Ultrasound imaging  confirms appropriate placement of the needles within the thyroid nodule. IMPRESSION: Technically successful ultrasound guided fine needle aspiration of nodule located in the inferior aspect of the left thyroid. Performed by Anders Grant NP Electronically  Signed   By: Marliss Coots M.D.   On: 11/24/2023 11:18    Assessment  59 y.o. 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  Hematemesis, rectal bleeding, gastric and duodenal angiectasias, abdominal pain: - Hgb 12 few days prior and now rending down (11.2>>9.4). - EGD February with esophagitis and gastritis - EGD 4/3 - normal esophagus.  Gastritis, 13 bleeding angiectasia's in the stomach with APC, single nonbleeding angiectasia in the duodenum treated with APC therapy. - EGD 4/8 - 1cm hiatal hernia, 8 cratered gastric ulcers (two oozing), possibly post ablative ulcers (injected with epi and clips placed) - CTA 4/8 with hemostasis clips present within the stomach, no evidence of active GI bleed.  - RUQ [place] 4/3 with numerous hepatic cyst which appear mildly complex on ultrasound, however CT suggested simple cyst  Given CTA negative and ongoing BRBPR we will evaluate with colonoscopy tomorrow. Capsule if colonoscopy negative.   Plan / Recommendations  Gastrin level PPI BID Carafate 1g QID Clear liquids Colonoscopy tomorrow with Dr. [name] Potential for capsule if colonoscopy negative Avoid NSAIDs Pain and antiemetics per hospitalist.     LOS: 0 days    12/21/2023, 3:14 PM   Brooke Bonito, MSN, FNP-BC, AGACNP-BC [facility]  I have reviewed the note and agree with the APP's assessment as described in this progress note  Patient presenting with recurrent episodes of hematemesis and some rectal bleeding, along with abdominal pain.  Unclear why the patient was discharged from the hospital pending final results of imaging.  Our gastroenterology service was following her case  until yesterday when she was discharged from the hospital.  Received results of imaging which was negative for any acute intra-abdominal abnormalities or abnormalities concerning for vasculitis or active GI bleeding.  Will proceed with a colonoscopy to evaluate for her bleeding episodes.  If negative, may need to proceed with a capsule endoscopy and possible bleeding scan.  Will need to keep n.p.o. after midnight, okay to take clear liquids for now.  Katrinka Blazing, MD Gastroenterology and Hepatology [facility]

## 2023-12-22 ENCOUNTER — Encounter (HOSPITAL_COMMUNITY)
Admission: EM | Disposition: A | Discharge status: Home / Self Care | Payer: Self-pay | Source: Home / Self Care | Attending: Internal Medicine

## 2023-12-22 ENCOUNTER — Observation Stay (HOSPITAL_COMMUNITY): Admitting: Anesthesiology

## 2023-12-22 ENCOUNTER — Encounter (INDEPENDENT_AMBULATORY_CARE_PROVIDER_SITE_OTHER): Payer: Self-pay | Admitting: *Deleted

## 2023-12-22 ENCOUNTER — Encounter (HOSPITAL_COMMUNITY): Payer: Self-pay | Admitting: Internal Medicine

## 2023-12-22 ENCOUNTER — Other Ambulatory Visit: Payer: Self-pay

## 2023-12-22 DIAGNOSIS — F411 Generalized anxiety disorder: Secondary | ICD-10-CM | POA: Diagnosis present

## 2023-12-22 DIAGNOSIS — E876 Hypokalemia: Secondary | ICD-10-CM | POA: Diagnosis present

## 2023-12-22 DIAGNOSIS — K250 Acute gastric ulcer with hemorrhage: Secondary | ICD-10-CM | POA: Diagnosis not present

## 2023-12-22 DIAGNOSIS — Z8041 Family history of malignant neoplasm of ovary: Secondary | ICD-10-CM | POA: Diagnosis not present

## 2023-12-22 DIAGNOSIS — L120 Bullous pemphigoid: Secondary | ICD-10-CM

## 2023-12-22 DIAGNOSIS — F4310 Post-traumatic stress disorder, unspecified: Secondary | ICD-10-CM | POA: Diagnosis present

## 2023-12-22 DIAGNOSIS — E785 Hyperlipidemia, unspecified: Secondary | ICD-10-CM

## 2023-12-22 DIAGNOSIS — K649 Unspecified hemorrhoids: Secondary | ICD-10-CM

## 2023-12-22 DIAGNOSIS — K449 Diaphragmatic hernia without obstruction or gangrene: Secondary | ICD-10-CM | POA: Diagnosis present

## 2023-12-22 DIAGNOSIS — Z800 Family history of malignant neoplasm of digestive organs: Secondary | ICD-10-CM | POA: Diagnosis not present

## 2023-12-22 DIAGNOSIS — G4700 Insomnia, unspecified: Secondary | ICD-10-CM | POA: Diagnosis present

## 2023-12-22 DIAGNOSIS — G8929 Other chronic pain: Secondary | ICD-10-CM

## 2023-12-22 DIAGNOSIS — F331 Major depressive disorder, recurrent, moderate: Secondary | ICD-10-CM | POA: Diagnosis present

## 2023-12-22 DIAGNOSIS — R519 Headache, unspecified: Secondary | ICD-10-CM

## 2023-12-22 DIAGNOSIS — D649 Anemia, unspecified: Secondary | ICD-10-CM | POA: Diagnosis not present

## 2023-12-22 DIAGNOSIS — G894 Chronic pain syndrome: Secondary | ICD-10-CM | POA: Diagnosis present

## 2023-12-22 DIAGNOSIS — K254 Chronic or unspecified gastric ulcer with hemorrhage: Secondary | ICD-10-CM | POA: Diagnosis not present

## 2023-12-22 DIAGNOSIS — K920 Hematemesis: Secondary | ICD-10-CM | POA: Diagnosis present

## 2023-12-22 DIAGNOSIS — Z1152 Encounter for screening for COVID-19: Secondary | ICD-10-CM | POA: Diagnosis not present

## 2023-12-22 DIAGNOSIS — Z79899 Other long term (current) drug therapy: Secondary | ICD-10-CM | POA: Diagnosis not present

## 2023-12-22 DIAGNOSIS — K921 Melena: Secondary | ICD-10-CM | POA: Diagnosis present

## 2023-12-22 DIAGNOSIS — F410 Panic disorder [episodic paroxysmal anxiety] without agoraphobia: Secondary | ICD-10-CM | POA: Diagnosis present

## 2023-12-22 DIAGNOSIS — K2961 Other gastritis with bleeding: Secondary | ICD-10-CM | POA: Diagnosis present

## 2023-12-22 DIAGNOSIS — E042 Nontoxic multinodular goiter: Secondary | ICD-10-CM | POA: Diagnosis present

## 2023-12-22 DIAGNOSIS — E66811 Obesity, class 1: Secondary | ICD-10-CM | POA: Diagnosis present

## 2023-12-22 DIAGNOSIS — K648 Other hemorrhoids: Secondary | ICD-10-CM | POA: Diagnosis present

## 2023-12-22 DIAGNOSIS — F419 Anxiety disorder, unspecified: Secondary | ICD-10-CM

## 2023-12-22 DIAGNOSIS — R109 Unspecified abdominal pain: Secondary | ICD-10-CM | POA: Diagnosis present

## 2023-12-22 DIAGNOSIS — K209 Esophagitis, unspecified without bleeding: Secondary | ICD-10-CM | POA: Diagnosis present

## 2023-12-22 DIAGNOSIS — G4710 Hypersomnia, unspecified: Secondary | ICD-10-CM | POA: Diagnosis present

## 2023-12-22 DIAGNOSIS — K7689 Other specified diseases of liver: Secondary | ICD-10-CM | POA: Diagnosis present

## 2023-12-22 DIAGNOSIS — R1030 Lower abdominal pain, unspecified: Secondary | ICD-10-CM | POA: Diagnosis not present

## 2023-12-22 DIAGNOSIS — Z6831 Body mass index (BMI) 31.0-31.9, adult: Secondary | ICD-10-CM | POA: Diagnosis not present

## 2023-12-22 HISTORY — PX: COLONOSCOPY: SHX5424

## 2023-12-22 HISTORY — PX: GIVENS CAPSULE STUDY: SHX5432

## 2023-12-22 LAB — BASIC METABOLIC PANEL WITH GFR
Anion gap: 7 (ref 5–15)
BUN: 13 mg/dL (ref 6–20)
CO2: 22 mmol/L (ref 22–32)
Calcium: 8.9 mg/dL (ref 8.9–10.3)
Chloride: 110 mmol/L (ref 98–111)
Creatinine, Ser: 0.79 mg/dL (ref 0.44–1.00)
GFR, Estimated: >60 mL/min (ref >60)
Glucose, Bld: 68 mg/dL — ABNORMAL LOW (ref 70–99)
Potassium: 3.8 mmol/L (ref 3.5–5.1)
Sodium: 139 mmol/L (ref 135–145)

## 2023-12-22 LAB — CBC
HCT: 32.6 % — ABNORMAL LOW (ref 36.0–46.0)
Hemoglobin: 10.2 g/dL — ABNORMAL LOW (ref 12.0–15.0)
MCH: 30.3 pg (ref 26.0–34.0)
MCHC: 31.3 g/dL (ref 30.0–36.0)
MCV: 96.7 fL (ref 80.0–100.0)
Platelets: 224 10*3/uL (ref 150–400)
RBC: 3.37 MIL/uL — ABNORMAL LOW (ref 3.87–5.11)
RDW: 14.5 % (ref 11.5–15.5)
WBC: 3.5 10*3/uL — ABNORMAL LOW (ref 4.0–10.5)
nRBC: 0 % (ref 0.0–0.2)

## 2023-12-22 SURGERY — IMAGING PROCEDURE, GI TRACT, INTRALUMINAL, VIA CAPSULE

## 2023-12-22 SURGERY — COLONOSCOPY
Anesthesia: General

## 2023-12-22 MED ORDER — LACTATED RINGERS IV SOLN: INTRAVENOUS | Status: DC | PRN

## 2023-12-22 MED ORDER — LIDOCAINE HCL (CARDIAC) PF 100 MG/5ML IV SOSY
PREFILLED_SYRINGE | INTRAVENOUS | Status: DC | PRN
  Administered 2023-12-22: 80 mg via INTRATRACHEAL

## 2023-12-22 MED ORDER — PROPOFOL 10 MG/ML IV BOLUS
INTRAVENOUS | Status: DC | PRN
  Administered 2023-12-22: 100 mg via INTRAVENOUS
  Administered 2023-12-22: 100 ug/kg/min via INTRAVENOUS

## 2023-12-22 MED ORDER — PHENYLEPHRINE 80 MCG/ML (10ML) SYRINGE FOR IV PUSH (FOR BLOOD PRESSURE SUPPORT)
PREFILLED_SYRINGE | INTRAVENOUS | Status: DC | PRN
  Administered 2023-12-22 (×4): 80 ug via INTRAVENOUS

## 2023-12-22 NOTE — Assessment & Plan Note (Signed)
 Follow-up as outpatient

## 2023-12-22 NOTE — Assessment & Plan Note (Addendum)
Continue pain control with acetaminophen.

## 2023-12-22 NOTE — Assessment & Plan Note (Addendum)
 Depression  Continue diazepam, changed to duloxetine to address chronic pain.  Likely her uncontrolled pain is related to depression and or anxiety, consulted psychiatry for further evaluation.

## 2023-12-22 NOTE — Progress Notes (Signed)
 Pt retained enema fluid for 15 minutes. Pt passed clear, green colored liquid with sediment noted in toilet, no solid stool or blood noted. Pt tolerated well. Pt currently being transported to Endo via [facility] for planned procedure.

## 2023-12-22 NOTE — Interval H&P Note (Addendum)
 History and Physical Interval Note:  12/22/2023 10:24 AM  Sabrina [name]  has presented today for surgery, with the diagnosis of hematemesis, hematochezia, anemia.  The various methods of treatment have been discussed with the patient and family. After consideration of risks, benefits and other options for treatment, the patient has consented to  Procedure(s): COLONOSCOPY (N/A) as a surgical intervention.  The patient's history has been reviewed, patient examined, no change in status, stable for surgery.  I have reviewed the patient's chart and labs.  Questions were answered to the patient's satisfaction.    We will proceed with Colonoscopy as scheduled.    I thoroughly discussed with the patient the procedure, including the risks involved. Patient understands what the procedure involves including the benefits and any risks. Patient understands alternatives to the proposed procedure. Risks including (but not limited to) bleeding, tearing of the lining (perforation), rupture of adjacent organs, problems with heart and lung function, infection, and medication reactions. A small percentage of complications may require surgery, hospitalization, repeat endoscopic procedure, and/or transfusion.  Patient understood and agreed.   Patient also agreeable that if Colonoscopy is negative, would pursue with capsule endoscopy today   [name]

## 2023-12-22 NOTE — Op Note (Addendum)
 [facility] Patient Name: Sabrina [name] Procedure Date: 12/22/2023 10:21 AM MRN: [id number] Date of Birth: 1965/12/17 Attending MD: Sanjuan Dame , MD, [id number] CSN: [id number] Age: 58 Admit Type: Inpatient Procedure:                Colonoscopy Indications:              Hematochezia Providers:                Sanjuan Dame, MD, Angelica Ran, Lennice Sites                            Technician, Technician Referring MD:              Medicines:                Monitored Anesthesia Care Complications:            No immediate complications. Estimated Blood Loss:     Estimated blood loss: none. Procedure:                Pre-Anesthesia Assessment:                           - Prior to the procedure, a History and Physical                            was performed, and patient medications and                            allergies were reviewed. The patient's tolerance of                            previous anesthesia was also reviewed. The risks                            and benefits of the procedure and the sedation                            options and risks were discussed with the patient.                            All questions were answered, and informed consent                            was obtained. Prior Anticoagulants: The patient has                            taken no anticoagulant or antiplatelet agents. ASA                            Grade Assessment: II - A patient with mild systemic                            disease. After reviewing the risks and benefits,                            the  patient was deemed in satisfactory condition to                            undergo the procedure.                           After obtaining informed consent, the colonoscope                            was passed under direct vision. Throughout the                            procedure, the patient's blood pressure, pulse, and                            oxygen saturations were monitored  continuously. The                            [id number]-HQ190L ([id number]) scope was introduced through                            the anus and advanced to the the terminal ileum.                            The colonoscopy was performed without difficulty.                            The patient tolerated the procedure well. The                            quality of the bowel preparation was evaluated                            using the BBPS [facility] Bowel Preparation Scale)                            with scores of: Right Colon = 3, Transverse Colon =                            3 and Left Colon = 3 (entire mucosa seen well with                            no residual staining, small fragments of stool or                            opaque liquid). The total BBPS score equals 9. The                            terminal ileum, ileocecal valve, appendiceal                            orifice, and rectum were photographed. Scope In: 10:44:09 AM Scope Out: 11:17:17 AM Scope Withdrawal Time: 0 hours 27 minutes  24 seconds  Total Procedure Duration: 0 hours 33 minutes 8 seconds  Findings:      The perianal and digital rectal examinations were normal.      The terminal ileum appeared normal.      There is no endoscopic evidence of bleeding, erythema, inflammation,       mass or polyps in the entire colon.      Non-bleeding internal hemorrhoids were found during retroflexion. The       hemorrhoids were small. Impression:               - The examined portion of the ileum was normal.                           - Non-bleeding internal hemorrhoids.                           - No specimens collected.                           -There no evidence of old or active bleeding in the                            entire colon and ileum examined Moderate Sedation:      Per Anesthesia Care Recommendation:           Given recurrent abdominal pain and admissions for                            GI bleed , will proceed with  capsule endoscopy                            today as patient consented before colonoscopy ;                            although utility will be very low given no overt                            bleeding encountered                           If capsule endoscopy is negative , may benefit from                            surgical evaluation for diagnostic laproscopy as                            patient main concern is abdominal pain with history                            of endometriosis                           -Capsule endoscopy today                           -Repeat colonoscopy in 10 years for screening  purposes Procedure Code(s):        --- Professional ---                           [phone], Colonoscopy, flexible; diagnostic, including                            collection of specimen(s) by brushing or washing,                            when performed (separate procedure) Diagnosis Code(s):        --- Professional ---                           K64.8, Other hemorrhoids                           K92.1, Melena (includes Hematochezia) CPT copyright 2022 American Medical Association. All rights reserved. The codes documented in this report are preliminary and upon coder review may  be revised to meet current compliance requirements. Sanjuan Dame, MD Sanjuan Dame, MD 12/22/2023 11:29:25 AM This report has been signed electronically. Number of Addenda: 0

## 2023-12-22 NOTE — Brief Op Note (Signed)
 Patient underwent Colonoscopy  under propofol sedation.  Tolerated the procedure adequately.   FINDINGS:  - The examined portion of the ileum was normal.   - Non- bleeding internal hemorrhoids.   - No specimens collected.   - There no evidence of old or active bleeding in the entire colon and ileum examined  RECOMMENDATIONS  -Given recurrent abdominal pain and admissions for GI bleed , will proceed with capsule endoscopy today as patient consented before colonoscopy ; although utility will be very low given no overt bleeding encountered   -If capsule endoscopy is negative , may benefit from surgical evaluation for diagnostic laproscopy as patient main concern is abdominal pain with history of endometriosis   - Capsule endoscopy today   Vista Lawman, MD Gastroenterology and Hepatology [facility] Gastroenterology

## 2023-12-22 NOTE — Assessment & Plan Note (Addendum)
 Colonoscopy with normal ileum, non bleeding internal hemorrhoids.  No evidence of old or active bleeding in the entire colon and ileum examined.  Capsule endoscopy with no bleeding.   Plan to continue supportive medical therapy.  Continue sucralfate.   Acute on chronic abdominal pain likely is multifactorial, likely neuro- gut axis related. Continue with oxycodone and will plan to decrease the need of IV hydromorphone.  Dc gabapentin, and add scheduled acetaminophen  Continue as needed diazepam.  Will need further work up as outpatient, including GYN consultation

## 2023-12-22 NOTE — Assessment & Plan Note (Signed)
 Continue with statin therapy.  ?

## 2023-12-22 NOTE — Anesthesia Procedure Notes (Signed)
 Date/Time: 12/22/2023 10:43 AM  Performed by: Franco Nones, CRNAPre-anesthesia Checklist: Patient identified, Emergency Drugs available, Suction available, Timeout performed and Patient being monitored Patient Re-evaluated:Patient Re-evaluated prior to induction Oxygen Delivery Method: Non-rebreather mask

## 2023-12-22 NOTE — Anesthesia Preprocedure Evaluation (Signed)
 Anesthesia Evaluation  Patient identified by MRN, date of birth, ID band Patient awake    Reviewed: Allergy & Precautions, H&P , NPO status , Patient's Chart, lab work & pertinent test results, reviewed documented beta blocker date and time   History of Anesthesia Complications (+) PONV and history of anesthetic complications  Airway Mallampati: II  TM Distance: >3 FB Neck ROM: full    Dental no notable dental hx.    Pulmonary neg pulmonary ROS   Pulmonary exam normal breath sounds clear to auscultation       Cardiovascular Exercise Tolerance: Good hypertension, negative cardio ROS  Rhythm:regular Rate:Normal     Neuro/Psych  Headaches PSYCHIATRIC DISORDERS Anxiety Depression     Neuromuscular disease    GI/Hepatic Neg liver ROS, PUD,,,  Endo/Other  negative endocrine ROS    Renal/GU negative Renal ROS  negative genitourinary   Musculoskeletal   Abdominal   Peds  Hematology negative hematology ROS (+)   Anesthesia Other Findings   Reproductive/Obstetrics negative OB ROS                             Anesthesia Physical Anesthesia Plan  ASA: 2  Anesthesia Plan: General   Post-op Pain Management:    Induction:   PONV Risk Score and Plan: Propofol infusion  Airway Management Planned:   Additional Equipment:   Intra-op Plan:   Post-operative Plan:   Informed Consent: I have reviewed the patients History and Physical, chart, labs and discussed the procedure including the risks, benefits and alternatives for the proposed anesthesia with the patient or authorized representative who has indicated his/her understanding and acceptance.     Dental Advisory Given  Plan Discussed with: CRNA  Anesthesia Plan Comments:        Anesthesia Quick Evaluation

## 2023-12-22 NOTE — Progress Notes (Signed)
 Repeat tap water enema administered per order of C. Boris Lown, NP. Pt able to tolerate 850 ml. Pt advised to hold enema fluid for as long as possible and to call for assist to bathroom. Pt states understadning.

## 2023-12-22 NOTE — Progress Notes (Signed)
 Pt returned to room via stretcher s/p colonoscopy. Pt A&O, ambulatory from stretcher to restroom to void and then to bed without assistance. Abd soft, bowel sounds active. Tender to palpation. Pt requesting medication for abd pain and nausea. VSS. Call bell within reach, bed in low position. Advised pt to call for assistance, states understanding.

## 2023-12-22 NOTE — Anesthesia Procedure Notes (Signed)
 Date/Time: 12/22/2023 10:33 AM  Performed by: Franco Nones, CRNAPre-anesthesia Checklist: Patient identified, Emergency Drugs available, Suction available, Timeout performed and Patient being monitored Patient Re-evaluated:Patient Re-evaluated prior to induction Oxygen Delivery Method: Nasal Cannula

## 2023-12-22 NOTE — Assessment & Plan Note (Signed)
Calculated BMI is 30,0  

## 2023-12-22 NOTE — Hospital Course (Addendum)
 [name] was admitted to the hospital with the working diagnosis of abdominal pain to rule out lower GI bleed.   58 yo female with the past medical history of anxiety, depression, hyperlipidemia, GERD with multiple admissions for lower GI bleed. Recent hospitalization 04/08 to 04/09 for GI bleed, she underwent upper endoscopy, finding gastric ulcers. She had localized treatment and was discharged on pantoprazole.  At home patient had severe abdominal pain, associated with melena and hematochezia, prompting her to come back to the ED. On her initial physical examination her blood pressure was 112/90, HR 72, RR 18 and 02 saturation 100%. Lungs with no wheezing or rhonchi, heart with S1 and S2 present and regular with no gallops rubs or murmurs, abdomen with no distention and non tender, no lower extremity edema.   Na 138, K 3,0 Cl 103 bicarbonate 26 glucose 84 bun 20 cr 0,96  AST 25 ALT 20  Wbc 3,7 hgb 9,4 plt 212   04/11 GI was consulted and patient underwent colonoscopy, with no signs of active bleeding.  04/12 capsule endoscopy with no abnormalities found in the gastrointestinal lining except some prominent vessels. No evidence of overt or old bleeding.  04/13 plan to change venlafaxine to duloxetine for better pain control. 04/14 consulted general surgery and psychiatry. Patient continue to have abdominal pain.  04/15 her abdominal pain has improved, she continue tolerating po well.  Plan to follow up with primary care and psychiatry as outpatient,

## 2023-12-22 NOTE — Transfer of Care (Signed)
 Immediate Anesthesia Transfer of Care Note  Patient: Sabrina [name]  Procedure(s) Performed: COLONOSCOPY  Patient Location: PACU  Anesthesia Type:General  Level of Consciousness: awake and patient cooperative  Airway & Oxygen Therapy: Patient Spontanous Breathing  Post-op Assessment: Report given to RN and Post -op Vital signs reviewed and stable  Post vital signs: Reviewed and stable  Last Vitals:  Vitals Value Taken Time  BP 88/60 12/22/2023  1124  Temp 36.3 C 12/22/23 1123  Pulse 81 12/22/23 1123  Resp 9 12/22/23 1123  SpO2 100 % 12/22/23 1123    Last Pain:  Vitals:   12/22/23 1034  TempSrc:   PainSc: 8       Patients Stated Pain Goal: 3 (12/22/23 0912)  Complications: No notable events documented.

## 2023-12-22 NOTE — Progress Notes (Signed)
  Progress Note   Patient: Sabrina [name] UJW:[id number] DOB: 07/18/1966 DOA: 12/21/2023     0 DOS: the patient was seen and examined on 12/22/2023   Brief hospital course: Mrs. Barreras was admitted to the hospital with the working diagnosis of abdominal pain to rule out lower GI bleed.   58 yo female with the past medical history of anxiety, depression, hyperlipidemia, GERD with multiple admissions for lower GI bleed. Recent hospitalization 04/08 to 04/09 for GI bleed, she underwent upper endoscopy, finding gastric ulcers. She had localized treatment and was discharged on pantoprazole.  At home patient had severe abdominal pain, associated with melena and hematochezia, prompting her to come back to the ED. On her initial physical examination her blood pressure was 112/90, HR 72, RR 18 and 02 saturation 100%. Lungs with no wheezing or rhonchi, heart with S1 and S2 present and regular with no gallops rubs or murmurs, abdomen with no distention and non tender, no lower extremity edema.   04/11 GI was consulted and patient underwent colonoscopy, with no signs of active bleeding.    Assessment and Plan: Acute gastric ulcer with hemorrhage Colonoscopy with normal ileum, non bleeding internal hemorrhoids.  No evidence of old or active bleeding in the entire colon and ileum examined.   Plan to continue supportive medical therapy.  Further work up with capsule endescopy.  Follow up with GI recommendations   Continue with sucralfate.   Anxiety Depression  Continue diazepam and venlafaxine   Bullous pemphigoid Follow up as outpatient.   Chronic headaches Continue pain control with oral analgesics.   Dyslipidemia Continue with statin therapy  Obesity, class 1 Calculated BMI is 30,0        Subjective: Patient is post colonoscopy, her abdominal pain is improved with analgesics, no chest pain and no dyspnea   Physical Exam: Vitals:   12/22/23 1123 12/22/23 1130 12/22/23 1141 12/22/23  1200  BP: (!) 88/60 (!) 75/58 94/65 120/89  Pulse: 81 88 70 73  Resp: (!) 9 15 11 18   Temp: (!) 97.4 F (36.3 C)  (!) 97.4 F (36.3 C) 97.6 F (36.4 C)  TempSrc:    Oral  SpO2: 100% 100% 100% 100%  Weight:      Height:       Neurology awake and alert ENT with mild pallor with no icterus Cardiovascular with S1 and S2 present and regular with no gallops, rubs or murmurs Respiratory with no rales or wheezing, no rhonchi  Abdomen with no distention  Data Reviewed:    Family Communication: no family at the bedside   Disposition: Status is: Observation The patient will require care spanning > 2 midnights and should be moved to inpatient because: abdominal pain   Planned Discharge Destination: Home     Author: Coralie Keens, MD 12/22/2023 3:39 PM  For on call review [email or web address].

## 2023-12-22 NOTE — Plan of Care (Signed)
 Colonoscopy performed today with no acute findings. Pt currently undergoing Capsule Study. Still requiring oral and IV meds for c/o abd pain and IV meds for nausea.  Problem: Education: Goal: Knowledge of General Education information will improve Description: Including pain rating scale, medication(s)/side effects and non-pharmacologic comfort measures Outcome: Progressing   Problem: Health Behavior/Discharge Planning: Goal: Ability to manage health-related needs will improve Outcome: Progressing   Problem: Clinical Measurements: Goal: Ability to maintain clinical measurements within normal limits will improve Outcome: Progressing Goal: Will remain free from infection Outcome: Progressing Goal: Diagnostic test results will improve Outcome: Progressing Goal: Respiratory complications will improve Outcome: Progressing Goal: Cardiovascular complication will be avoided Outcome: Progressing   Problem: Activity: Goal: Risk for activity intolerance will decrease Outcome: Progressing   Problem: Nutrition: Goal: Adequate nutrition will be maintained Outcome: Progressing   Problem: Coping: Goal: Level of anxiety will decrease Outcome: Progressing   Problem: Elimination: Goal: Will not experience complications related to bowel motility Outcome: Progressing Goal: Will not experience complications related to urinary retention Outcome: Progressing   Problem: Pain Managment: Goal: General experience of comfort will improve and/or be controlled Outcome: Progressing   Problem: Safety: Goal: Ability to remain free from injury will improve Outcome: Progressing   Problem: Skin Integrity: Goal: Risk for impaired skin integrity will decrease Outcome: Progressing

## 2023-12-23 DIAGNOSIS — R1030 Lower abdominal pain, unspecified: Secondary | ICD-10-CM | POA: Diagnosis not present

## 2023-12-23 DIAGNOSIS — K250 Acute gastric ulcer with hemorrhage: Secondary | ICD-10-CM | POA: Diagnosis not present

## 2023-12-23 DIAGNOSIS — F419 Anxiety disorder, unspecified: Secondary | ICD-10-CM | POA: Diagnosis not present

## 2023-12-23 DIAGNOSIS — K2961 Other gastritis with bleeding: Secondary | ICD-10-CM | POA: Diagnosis not present

## 2023-12-23 DIAGNOSIS — E785 Hyperlipidemia, unspecified: Secondary | ICD-10-CM | POA: Diagnosis not present

## 2023-12-23 DIAGNOSIS — E876 Hypokalemia: Secondary | ICD-10-CM | POA: Diagnosis present

## 2023-12-23 DIAGNOSIS — L120 Bullous pemphigoid: Secondary | ICD-10-CM | POA: Diagnosis not present

## 2023-12-23 LAB — BASIC METABOLIC PANEL WITH GFR
Anion gap: 9 (ref 5–15)
BUN: 7 mg/dL (ref 6–20)
CO2: 24 mmol/L (ref 22–32)
Calcium: 8.7 mg/dL — ABNORMAL LOW (ref 8.9–10.3)
Chloride: 105 mmol/L (ref 98–111)
Creatinine, Ser: 0.78 mg/dL (ref 0.44–1.00)
GFR, Estimated: >60 mL/min (ref >60)
Glucose, Bld: 77 mg/dL (ref 70–99)
Potassium: 3.4 mmol/L — ABNORMAL LOW (ref 3.5–5.1)
Sodium: 138 mmol/L (ref 135–145)

## 2023-12-23 LAB — CBC
HCT: 32.1 % — ABNORMAL LOW (ref 36.0–46.0)
Hemoglobin: 10.3 g/dL — ABNORMAL LOW (ref 12.0–15.0)
MCH: 30.5 pg (ref 26.0–34.0)
MCHC: 32.1 g/dL (ref 30.0–36.0)
MCV: 95 fL (ref 80.0–100.0)
Platelets: 231 10*3/uL (ref 150–400)
RBC: 3.38 MIL/uL — ABNORMAL LOW (ref 3.87–5.11)
RDW: 14.6 % (ref 11.5–15.5)
WBC: 2.9 10*3/uL — ABNORMAL LOW (ref 4.0–10.5)
nRBC: 0 % (ref 0.0–0.2)

## 2023-12-23 MED ORDER — DULOXETINE HCL 30 MG PO CPEP: 30.0000 mg | ORAL_CAPSULE | Freq: Every day | ORAL | Status: DC

## 2023-12-23 MED ORDER — ACETAMINOPHEN 325 MG PO TABS
650.0000 mg | ORAL_TABLET | Freq: Four times a day (QID) | ORAL | Status: DC
  Administered 2023-12-23 – 2023-12-26 (×11): 650 mg via ORAL
  Filled 2023-12-23 (×13): qty 2

## 2023-12-23 MED ORDER — PROMETHAZINE HCL 25 MG/ML IJ SOLN
INTRAMUSCULAR | Status: AC
  Filled 2023-12-23: qty 1

## 2023-12-23 MED ORDER — POTASSIUM CHLORIDE CRYS ER 20 MEQ PO TBCR
40.0000 meq | EXTENDED_RELEASE_TABLET | ORAL | Status: AC
  Administered 2023-12-23 (×2): 40 meq via ORAL
  Filled 2023-12-23 (×2): qty 2

## 2023-12-23 MED ORDER — PANTOPRAZOLE SODIUM 40 MG PO TBEC
40.0000 mg | DELAYED_RELEASE_TABLET | Freq: Every day | ORAL | Status: DC
  Administered 2023-12-23 – 2023-12-25 (×3): 40 mg via ORAL
  Filled 2023-12-23 (×3): qty 1

## 2023-12-23 NOTE — Progress Notes (Addendum)
  Progress Note   Patient: Sabrina [name] WUJ:[id number] DOB: 12/06/1965 DOA: 12/21/2023     1 DOS: the patient was seen and examined on 12/23/2023   Brief hospital course: Sabrina [name] was admitted to the hospital with the working diagnosis of abdominal pain to rule out lower GI bleed.   58 yo female with the past medical history of anxiety, depression, hyperlipidemia, GERD with multiple admissions for lower GI bleed. Recent hospitalization 04/08 to 04/09 for GI bleed, she underwent upper endoscopy, finding gastric ulcers. She had localized treatment and was discharged on pantoprazole.  At home patient had severe abdominal pain, associated with melena and hematochezia, prompting her to come back to the ED. On her initial physical examination her blood pressure was 112/90, HR 72, RR 18 and 02 saturation 100%. Lungs with no wheezing or rhonchi, heart with S1 and S2 present and regular with no gallops rubs or murmurs, abdomen with no distention and non tender, no lower extremity edema.   04/11 GI was consulted and patient underwent colonoscopy, with no signs of active bleeding.  04/12 capsule endoscopy with no abnormalities found in the gastrointestinal lining except some prominent vessels. No evidence of overt or old bleeding.    Assessment and Plan: * Acute gastric ulcer with hemorrhage Colonoscopy with normal ileum, non bleeding internal hemorrhoids.  No evidence of old or active bleeding in the entire colon and ileum examined.  Capsule endoscopy with no bleeding.   Plan to continue supportive medical therapy.  Continue sucralfate.   Acute on chronic abdominal pain likely is multifactorial, likely neuro- gut axis related. Continue with oxycodone and will plan to decrease the need of IV hydromorphone.  Dc gabapentin, and add scheduled acetaminophen  Continue as needed diazepam.  Will need further work up as outpatient, including GYN consultation   Anxiety Depression  Continue diazepam  and venlafaxine   Bullous pemphigoid Follow up as outpatient.   Chronic headaches Continue pain control with oral analgesics.   Dyslipidemia Continue with statin therapy  Obesity, class 1 Calculated BMI is 30,0   Hypokalemia Renal function with serum cr at 0,78 with K at 3,4 and serum bicarbonate at 24  Na 138   Plan to add Kcl 40 meq x2 and follow up renal function and electrolytes.       Subjective: patient continue to have intermittent abdominal pain, improved with IV analgesics, no nausea or vomiting, no chest pain or dyspnea   Physical Exam: Vitals:   12/22/23 1950 12/22/23 2011 12/23/23 0402 12/23/23 1315  BP:  97/68 109/68 101/63  Pulse:  81 77 81  Resp: 16 16 16    Temp:  98.2 F (36.8 C) 98.6 F (37 C) 98.7 F (37.1 C)  TempSrc:  Oral Oral Oral  SpO2:  94% 98% 100%  Weight:      Height:       Neurology awake and alert ENT with mild pallor with no icterus Cardiovascular with S1 and S2 present and regular with no gallops, rubs or murmurs Respiratory with no rales or wheezing Abdomen with no distention, mild tender to palpation with no rebound or guarding.   No lower extremity edema  Data Reviewed:    Family Communication: no family at the bedside   Disposition: Status is: Inpatient Remains inpatient appropriate because: analgesics   Planned Discharge Destination: Home     Author: Albertus Alt, MD 12/23/2023 1:15 PM  For on call review [email or web address].

## 2023-12-23 NOTE — Assessment & Plan Note (Signed)
 Renal function with serum cr at 0,78 with K at 3,4 and serum bicarbonate at 24  Na 138   Plan to add Kcl 40 meq x2 and follow up renal function and electrolytes.

## 2023-12-23 NOTE — Plan of Care (Signed)
  Problem: Education: Goal: Knowledge of General Education information will improve Description: Including pain rating scale, medication(s)/side effects and non-pharmacologic comfort measures Outcome: Progressing   Problem: Clinical Measurements: Goal: Ability to maintain clinical measurements within normal limits will improve Outcome: Progressing Goal: Will remain free from infection Outcome: Progressing   Problem: Activity: Goal: Risk for activity intolerance will decrease Outcome: Progressing   Problem: Coping: Goal: Level of anxiety will decrease Outcome: Progressing   Problem: Pain Managment: Goal: General experience of comfort will improve and/or be controlled Outcome: Progressing   Problem: Safety: Goal: Ability to remain free from injury will improve Outcome: Progressing

## 2023-12-23 NOTE — Procedures (Signed)
 Small Bowel Givens Capsule Study Procedure date: 12/22/2023  PCP:  Dr. Ruel Cotta, Heinz Llano, MD  Indication for procedure:  Abdominal pain , recurrent hematemesis and hematochezia  Findings:  Study was adequate as capsule reached the cecum and the bowel preparation was adequate in the small bowel.  No abnormalities were found in the gastrointestinal lining except some prominent vessels .   No evidence of overt or old bleeding .     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  I recommend surgical and GYN evaluation as patient had history of endometriosis   I personally communicated these recommendations to the patient  Brittanni Cariker Faizan Sherrel Shafer , MD Gastroenterology and Hepatology [facility] Gastroenterology

## 2023-12-23 NOTE — Plan of Care (Signed)
   Problem: Education: Goal: Knowledge of General Education information will improve Description: Including pain rating scale, medication(s)/side effects and non-pharmacologic comfort measures Outcome: Progressing   Problem: Nutrition: Goal: Adequate nutrition will be maintained Outcome: Progressing   Problem: Coping: Goal: Level of anxiety will decrease Outcome: Progressing

## 2023-12-24 DIAGNOSIS — R519 Headache, unspecified: Secondary | ICD-10-CM | POA: Diagnosis not present

## 2023-12-24 DIAGNOSIS — K250 Acute gastric ulcer with hemorrhage: Secondary | ICD-10-CM | POA: Diagnosis not present

## 2023-12-24 DIAGNOSIS — L120 Bullous pemphigoid: Secondary | ICD-10-CM | POA: Diagnosis not present

## 2023-12-24 DIAGNOSIS — F419 Anxiety disorder, unspecified: Secondary | ICD-10-CM | POA: Diagnosis not present

## 2023-12-24 LAB — BASIC METABOLIC PANEL WITH GFR
Anion gap: 7 (ref 5–15)
BUN: 13 mg/dL (ref 6–20)
CO2: 25 mmol/L (ref 22–32)
Calcium: 8.8 mg/dL — ABNORMAL LOW (ref 8.9–10.3)
Chloride: 107 mmol/L (ref 98–111)
Creatinine, Ser: 0.96 mg/dL (ref 0.44–1.00)
GFR, Estimated: >60 mL/min (ref >60)
Glucose, Bld: 88 mg/dL (ref 70–99)
Potassium: 3.9 mmol/L (ref 3.5–5.1)
Sodium: 139 mmol/L (ref 135–145)

## 2023-12-24 MED ORDER — PROMETHAZINE HCL 12.5 MG PO TABS
12.5000 mg | ORAL_TABLET | Freq: Four times a day (QID) | ORAL | Status: DC | PRN
  Administered 2023-12-24 – 2023-12-26 (×4): 12.5 mg via ORAL
  Filled 2023-12-24 (×4): qty 1

## 2023-12-24 MED ORDER — GABAPENTIN 300 MG PO CAPS
600.0000 mg | ORAL_CAPSULE | Freq: Three times a day (TID) | ORAL | Status: DC
  Administered 2023-12-24 – 2023-12-26 (×6): 600 mg via ORAL
  Filled 2023-12-24 (×6): qty 2

## 2023-12-24 MED ORDER — DULOXETINE HCL 60 MG PO CPEP
60.0000 mg | ORAL_CAPSULE | Freq: Every day | ORAL | Status: DC
  Administered 2023-12-25 – 2023-12-26 (×2): 60 mg via ORAL
  Filled 2023-12-24 (×2): qty 1

## 2023-12-24 NOTE — Plan of Care (Signed)
  Problem: Education: Goal: Knowledge of General Education information will improve Description: Including pain rating scale, medication(s)/side effects and non-pharmacologic comfort measures Outcome: Progressing   Problem: Activity: Goal: Risk for activity intolerance will decrease Outcome: Progressing   Problem: Coping: Goal: Level of anxiety will decrease Outcome: Progressing   Problem: Nutrition: Goal: Adequate nutrition will be maintained Outcome: Progressing   Problem: Safety: Goal: Ability to remain free from injury will improve Outcome: Progressing

## 2023-12-24 NOTE — Plan of Care (Addendum)
 Pain managed with oral and IV pain medication. Oral pain medication encouraged when patient requested IV pain medication and it was not time for it yet. Pt has been resting this morning comfortably in NAD. Pt ate 100% of her dinner and drank 1 cans of soda and ice pop overnight.     Problem: Education: Goal: Knowledge of General Education information will improve Description: Including pain rating scale, medication(s)/side effects and non-pharmacologic comfort measures Outcome: Progressing   Problem: Clinical Measurements: Goal: Ability to maintain clinical measurements within normal limits will improve Outcome: Progressing Goal: Diagnostic test results will improve Outcome: Progressing Goal: Cardiovascular complication will be avoided Outcome: Progressing   Problem: Nutrition: Goal: Adequate nutrition will be maintained Outcome: Progressing   Problem: Coping: Goal: Level of anxiety will decrease Outcome: Progressing   Problem: Elimination: Goal: Will not experience complications related to bowel motility Outcome: Progressing   Problem: Skin Integrity: Goal: Risk for impaired skin integrity will decrease Outcome: Progressing

## 2023-12-24 NOTE — Progress Notes (Signed)
 Progress Note   Patient: Sabrina [name] ZOX:[id number] DOB: 10-23-1965 DOA: 12/21/2023     2 DOS: the patient was seen and examined on 12/24/2023   Brief hospital course: Mrs. Davia was admitted to the hospital with the working diagnosis of abdominal pain to rule out lower GI bleed.   58 yo female with the past medical history of anxiety, depression, hyperlipidemia, GERD with multiple admissions for lower GI bleed. Recent hospitalization 04/08 to 04/09 for GI bleed, she underwent upper endoscopy, finding gastric ulcers. She had localized treatment and was discharged on pantoprazole.  At home patient had severe abdominal pain, associated with melena and hematochezia, prompting her to come back to the ED. On her initial physical examination her blood pressure was 112/90, HR 72, RR 18 and 02 saturation 100%. Lungs with no wheezing or rhonchi, heart with S1 and S2 present and regular with no gallops rubs or murmurs, abdomen with no distention and non tender, no lower extremity edema.   04/11 GI was consulted and patient underwent colonoscopy, with no signs of active bleeding.  04/12 capsule endoscopy with no abnormalities found in the gastrointestinal lining except some prominent vessels. No evidence of overt or old bleeding.  04/13 plan to change venlafaxine to duloxetine for better pain control.   Assessment and Plan: * Acute gastric ulcer with hemorrhage Colonoscopy with normal ileum, non bleeding internal hemorrhoids.  No evidence of old or active bleeding in the entire colon and ileum examined.  Capsule endoscopy with no bleeding.   Plan to continue supportive medical therapy.  Continue sucralfate and will add pantoprazole   Acute on chronic abdominal pain likely is multifactorial, likely neuro- gut axis related. Continue with oxycodone and as needed IV hydromorphone.  Plan to decreased frequency of IV analgesics in the next 24 hrs.  Scheduled acetaminophen and gabapentin.   Continue  as needed diazepam.  Will need further work up as outpatient, including GYN consultation  Will change venlafaxine to duloxetine.   Anxiety Depression  Continue diazepam and venlafaxine   Bullous pemphigoid Follow up as outpatient.   Chronic headaches Continue pain control with oral analgesics.   Dyslipidemia Continue with statin therapy  Obesity, class 1 Calculated BMI is 30,0   Hypokalemia Electrolytes have been corrected with K at 3,9 and serum bicarbonate at 25  Na 139. Patient is tolerating po well.          Subjective: Patient continue to have abdominal pain that is improved with IV hydromorphone, she continue to tolerate po will with no nausea or vomiting. No chest pain or dyspnea,.   Physical Exam: Vitals:   12/23/23 2049 12/23/23 2151 12/24/23 0438 12/24/23 1316  BP: 93/72 108/65 (!) 109/57 96/71  Pulse: 92 100 70 86  Resp: 18  15   Temp: 98.2 F (36.8 C)  98.1 F (36.7 C) 98.6 F (37 C)  TempSrc:      SpO2: 98% 97% 93% 99%  Weight:      Height:       Neurology awake and alert ENT with mild pallor Cardiovascular with S1 and S2 present and regular with no gallops, rubs or murmurs Respiratory with no rales or wheezing, no rhonchi  Abdomen with soft and not distended, tender to deep palpation with no rebound or guarding  No lower extremity edema  Data Reviewed:    Family Communication: no family at the bedside   Disposition: Status is: Inpatient Remains inpatient appropriate because: abdominal pain   Planned Discharge Destination: Home  Author: Albertus Alt, MD 12/24/2023 1:51 PM  For on call review [email or web address].

## 2023-12-25 ENCOUNTER — Encounter (INDEPENDENT_AMBULATORY_CARE_PROVIDER_SITE_OTHER): Payer: Self-pay | Admitting: Gastroenterology

## 2023-12-25 ENCOUNTER — Inpatient Hospital Stay (HOSPITAL_COMMUNITY)

## 2023-12-25 ENCOUNTER — Encounter (HOSPITAL_COMMUNITY): Payer: Self-pay | Admitting: Gastroenterology

## 2023-12-25 DIAGNOSIS — L120 Bullous pemphigoid: Secondary | ICD-10-CM | POA: Diagnosis not present

## 2023-12-25 DIAGNOSIS — F410 Panic disorder [episodic paroxysmal anxiety] without agoraphobia: Secondary | ICD-10-CM | POA: Diagnosis not present

## 2023-12-25 DIAGNOSIS — F411 Generalized anxiety disorder: Secondary | ICD-10-CM | POA: Diagnosis not present

## 2023-12-25 DIAGNOSIS — E876 Hypokalemia: Secondary | ICD-10-CM

## 2023-12-25 DIAGNOSIS — R1030 Lower abdominal pain, unspecified: Secondary | ICD-10-CM | POA: Diagnosis not present

## 2023-12-25 DIAGNOSIS — F419 Anxiety disorder, unspecified: Secondary | ICD-10-CM | POA: Diagnosis not present

## 2023-12-25 DIAGNOSIS — K250 Acute gastric ulcer with hemorrhage: Secondary | ICD-10-CM | POA: Diagnosis not present

## 2023-12-25 DIAGNOSIS — R519 Headache, unspecified: Secondary | ICD-10-CM | POA: Diagnosis not present

## 2023-12-25 DIAGNOSIS — F331 Major depressive disorder, recurrent, moderate: Secondary | ICD-10-CM | POA: Diagnosis present

## 2023-12-25 LAB — COMPREHENSIVE METABOLIC PANEL WITH GFR
ALT: 19 U/L (ref 0–44)
AST: 28 U/L (ref 15–41)
Albumin: 3.7 g/dL (ref 3.5–5.0)
Alkaline Phosphatase: 94 U/L (ref 38–126)
Anion gap: 9 (ref 5–15)
BUN: 11 mg/dL (ref 6–20)
CO2: 26 mmol/L (ref 22–32)
Calcium: 9.3 mg/dL (ref 8.9–10.3)
Chloride: 104 mmol/L (ref 98–111)
Creatinine, Ser: 0.87 mg/dL (ref 0.44–1.00)
GFR, Estimated: >60 mL/min (ref >60)
Glucose, Bld: 101 mg/dL — ABNORMAL HIGH (ref 70–99)
Potassium: 4 mmol/L (ref 3.5–5.1)
Sodium: 139 mmol/L (ref 135–145)
Total Bilirubin: 0.3 mg/dL (ref 0.0–1.2)
Total Protein: 6.6 g/dL (ref 6.5–8.1)

## 2023-12-25 LAB — HEMOGLOBIN AND HEMATOCRIT, BLOOD
HCT: 35.3 % — ABNORMAL LOW (ref 36.0–46.0)
Hemoglobin: 11.4 g/dL — ABNORMAL LOW (ref 12.0–15.0)

## 2023-12-25 LAB — GASTRIN: Gastrin: 31 pg/mL (ref 0–115)

## 2023-12-25 MED ORDER — TRAZODONE HCL 50 MG PO TABS: 100.0000 mg | ORAL_TABLET | Freq: Every evening | ORAL | Status: DC | PRN

## 2023-12-25 MED ORDER — PANTOPRAZOLE SODIUM 40 MG PO TBEC
40.0000 mg | DELAYED_RELEASE_TABLET | Freq: Two times a day (BID) | ORAL | Status: DC
  Administered 2023-12-25 – 2023-12-26 (×2): 40 mg via ORAL
  Filled 2023-12-25 (×2): qty 1

## 2023-12-25 MED ORDER — DIAZEPAM 5 MG PO TABS
10.0000 mg | ORAL_TABLET | Freq: Three times a day (TID) | ORAL | Status: DC | PRN
  Administered 2023-12-25: 10 mg via ORAL
  Filled 2023-12-25: qty 2

## 2023-12-25 NOTE — Consult Note (Cosign Needed Addendum)
 [facility] Psychiatric Consult Initial  Patient Name: .Sabrina [name]  MRN: [id number]  DOB: 07/11/1966  Consult Order details:  Orders (From admission, onward)     Start     Ordered   12/25/23 1133  CONSULT TO CALL ACT TEAM       Ordering Provider: Coralie Keens, MD  Provider:  (Not yet assigned)  Question:  Reason for Consult?  Answer:  consult psychiatry for depresion and anxiety   12/25/23 1132             Mode of Visit: Tele-visit Virtual Statement:TELE PSYCHIATRY ATTESTATION & CONSENT As the provider for this telehealth consult, I attest that I verified the patient's identity using two separate identifiers, introduced myself to the patient, provided my credentials, disclosed my location, and performed this encounter via a HIPAA-compliant, real-time, face-to-face, two-way, interactive audio and video platform and with the full consent and agreement of the patient (or guardian as applicable.) Patient physical location: [place] at [facility] Med-surg floor. Telehealth provider physical location: home office in state of [place] at [facility] ED.   Video start time: 15:38 Video end time: 16:18    Psychiatry Consult Evaluation  Service Date: December 25, 2023 LOS:  LOS: 3 days  Chief Complaint: "I've just been hurting so bad"   Primary Psychiatric Diagnoses  MDD (major depressive disorder), recurrent episode, moderate (HCC) 2.  Generalized anxiety disorder with panic attacks 3.   Panic disorder  Assessment   Sabrina [name] is a 58 y.o. caucasian female with a past psychiatric history of GAD with panic attacks, panic disorder, and unipolar major depression, with pertinent medical comorbidities/history that include hyperlipidemia, GERD, and multiple recent admissions for lower GI bleed, who presented this encounter by way of self, and was admitted for severe and uncontrolled abdominal pain to rule out lower GI bleed, who while has been receiving care on the medical floor for the  aforementioned chief complaints, began to endorse a recrudescence of challenges with depression and anxiety in the context of psychosocial stressors, thus psychiatry was consulted to follow the patient, for speciality evaluation to be performed, care measures to be suggested, and further recommendations.  Patient is currently voluntary at this time, but remains on the medical floor and not clear medically.  Upon evaluation, patient presents this encounter with symptomology that is most consistent with a moderate recurrent episode of unipolar major depression, and reduced stability of the patient's chronic illness courses of generalized anxiety and panic disorder.  Evidence of this is appreciable from the patient's endorsements of a recrudescence of a variety of depressive, anxious, and panic symptomology, as a result of severe progressive, resistant, and worsening of her medical comorbidities.  Patient at this time does not present with concerns for being an imminent risk for self or others, presenting with concerns for decompensation into psychosis, and/or presenting with concerns for lack of capacity, thus the patient at this time does not present as a candidate for inpatient mental health hospitalization recommendation, however, will proceed with the recommendations listed below for medication adjustments, in hopes of helping stabilize the patient while she is being treated for her medical complications.  Discussed with Dr. Enedina Finner who is in agreement with recommendations listed below.  Diagnoses:  Active Hospital problems: Active Problems:   Bullous pemphigoid   Generalized anxiety disorder with panic attacks   Panic disorder   Chronic headaches   Acute gastric ulcer with hemorrhage   Dyslipidemia   Obesity, class 1  Abdominal pain   Hypokalemia   MDD (major depressive disorder), recurrent episode, moderate (HCC)    Plan   #MDD #GAD #Panic Disorder  ## Psychiatric Medication  Recommendations:   - Recommend continue Cymbalta 60 mg p.o. daily - Recommend restart Valium back at 10 mg p.o. 3 times daily as needed (per outpatient psychiatrist current plan) - Recommend trazodone 100 mg p.o. nightly as needed  ## Medical Decision Making Capacity: Not specifically addressed in this encounter  ## Further Work-up: ECG for qtc monitoring  ## Disposition:-- There are no psychiatric contraindications to discharge at this time  ## Behavioral / Environmental: -Routine safety/agitation precautions    ## Safety and Observation Level:  - Based on my clinical evaluation, I estimate the patient to be at low risk of self harm in the current setting. - At this time, we recommend  routine. This decision is based on my review of the chart including patient's history and current presentation, interview of the patient, mental status examination, and consideration of suicide risk including evaluating suicidal ideation, plan, intent, suicidal or self-harm behaviors, risk factors, and protective factors. This judgment is based on our ability to directly address suicide risk, implement suicide prevention strategies, and develop a safety plan while the patient is in the clinical setting. Please contact our team if there is a concern that risk level has changed.  CSSR Risk Category:C-SSRS RISK CATEGORY: No Risk  Suicide Risk Assessment: Patient has following modifiable risk factors for suicide: under treated depression , which we are addressing by treatment recommendations/evaluations. Patient has following non-modifiable or demographic risk factors for suicide: None Patient has the following protective factors against suicide: Access to outpatient mental health care, Supportive family, Supportive friends, Pets in the home, Frustration tolerance, no history of suicide attempts, and no history of NSSIB  Thank you for this consult request. Recommendations have been communicated to the primary  team.  We will continue to follow at this time.   Volanda Gruber, NP     History of Present Illness   Sabrina [name] is a 58 y.o. caucasian female with a past psychiatric history of GAD with panic attacks, panic disorder, and unipolar major depression, with pertinent medical comorbidities/history that include hyperlipidemia, GERD, and multiple recent admissions for lower GI bleed, who presented this encounter by way of self, and was admitted for severe and uncontrolled abdominal pain to rule out lower GI bleed, who while has been receiving care on the medical floor for the aforementioned chief complaints, began to endorse a recrudescence of challenges with depression and anxiety in the context of psychosocial stressors, thus psychiatry was consulted to follow the patient, for speciality evaluation to be performed, care measures to be suggested, and further recommendations.  Patient is currently voluntary at this time, but remains on the medical floor and not clear medically.  Patient seen today by way of telehealth communications from the [facility] emergency department, while the patient was physically located at the [facility] med surgical inpatient unit, for face-to-face psychiatric evaluation.  Upon evaluation, patient endorses that for several months now she has been progressively worsening in her mental health, in the context of worsening medical comorbidities.  Expanding on this, patient endorses that this is her third round of hospitalizations for gastrointestinal hemorrhage, and this time around, is experiencing severe and almost debilitating abdominal pain, as well as states that she has recently in the last several months, had thyroid issues (thyroid mass), and shares that she has had  troubles with her liver in the form of having a number of cysts.  Expanding on the patient's progressive and worsening of her mental health, patient endorses that she has been feeling a lot more depressed,  anxious, and having panic attacks more frequently. From depressive symptomology, patient endorses that she has been having increased fatigue and lethargy, fluctuations in her sleep with hypersomnia and insomnia, troubles with concentration, periods of difficult anhedonia, and fluctuations in appetite.  From anxious symptomology, patient endorses that her generalized anxiety about a variety of things has been causing more fluctuations in her sleeping pattern, causing more frequent progressions into panic attacks, now having panic attacks almost 2 or 3 times daily that are difficult to control, has been having trouble relaxing, and has been having difficulties with feeling as if something awful might happen (I.e., further worsening of her medical comorbidities and complications).   Patient endorses she regularly prior to the last several months is normally pretty stable, but recent increases in medical complications and problems have really made things difficult.  Patient endorses that she is seen through [facility], sees a Dr. Levie Ream, states that she has seen him for many years now, as well as states that she participates in group therapy every week on Tuesdays, states that she has done this over the last 2 years. Patient endorses that in her stability, has regularly taken venlafaxine and Valium, but since hospitalization, and growing psychosocial stress, these medications have not been working, though they are very well-tolerated.  Patient endorses no history of inpatient mental health hospitalizations.  Patient endorses no history of suicide attempts.  Patient endorses no history of self injures behavior.  Patient endorses no tobacco, EtOH, and/or drug use, past or present.  Patient endorses no current suicidal thoughts and or homicidal thoughts.  Patient endorses no auditory or visual hallucinations, and objectively, does not appear to be presenting with psychotic features.   Patient orientation is intact, no concerns for fluctuations of consciousness.  Discussed with patient that given evaluation performed, inpatient mental health hospitalization recommendation would not be warranted after medical hospitalization, but that recommendations for medication changes at this time could be facilitated.  After extensive conversation about medication changes and recommendations, patient verbalized understanding, and that she was amenable to move forward with recommended plan to adjust her psychiatric medications.  Review of Systems  Constitutional:  Positive for malaise/fatigue and weight loss.  Gastrointestinal:  Positive for abdominal pain, blood in stool, nausea and vomiting (Blood).  Musculoskeletal:  Positive for myalgias.  Psychiatric/Behavioral:  Positive for depression. Negative for hallucinations, substance abuse and suicidal ideas. The patient is nervous/anxious and has insomnia.   All other systems reviewed and are negative.   Psychiatric and Social History  Psychiatric History:  Information collected from chart review/patient none endorsed  Prev Dx/Sx: MDD, GAD, panic disorder Current [facility]; has been seeing for many years now Home Meds (current): Venlafaxine, Valium Previous Med Trials: Venlafaxine, Valium, Klonopin, Xanax, Prozac, Paxil Therapy: Yes, currently goes to "[facility]" for the last 2 years every Tuesday  Prior Psych Hospitalization: None endorsed or reported  Prior Self Harm: None endorsed or reported Prior Violence: None endorsed or reported  Family Psych History: None endorsed or reported Family Hx suicide: None endorsed or reported  Social History:  Developmental Hx: within defined limits Educational Hx: Academic librarian Hx: Teacher, music Hx: No reports Living Situation: Lives alone in a house with  her animals  Spiritual Hx: No reports  Access to  weapons/lethal means: Denies  Substance History Alcohol: Reports very rare use Type of alcohol: None reported Last Drink: Does not know Number of drinks per day : None reported History of alcohol withdrawal seizures : None reported History of DT's : None reported Tobacco: None reported Illicit drugs: None reported Prescription drug abuse: None reported Rehab hx: None reported  Exam Findings  Physical Exam: As below Vital Signs:  Temp:  [97.8 F (36.6 C)-98.5 F (36.9 C)] 97.8 F (36.6 C) (04/14 1307) Pulse Rate:  [76-94] 76 (04/14 1307) Resp:  [14-18] 14 (04/14 1307) BP: (97-118)/(68-76) 106/76 (04/14 1307) SpO2:  [96 %-100 %] 98 % (04/14 1307) Blood pressure 106/76, pulse 76, temperature 97.8 F (36.6 C), temperature source Oral, resp. rate 14, height 5\' 4"  (1.626 m), weight 79.4 kg, SpO2 98%. Body mass index is 30.04 kg/m.  Physical Exam Vitals and nursing note reviewed.  Constitutional:      General: She is not in acute distress.    Appearance: Normal appearance. She is not ill-appearing, toxic-appearing or diaphoretic.  Pulmonary:     Effort: Pulmonary effort is normal.  Neurological:     Mental Status: She is alert and oriented to person, place, and time.     Motor: No tremor or seizure activity.  Psychiatric:        Attention and Perception: Attention and perception normal. She does not perceive auditory or visual hallucinations.        Mood and Affect: Affect normal. Mood is anxious and depressed.        Speech: Speech normal.        Behavior: Behavior is not agitated, slowed, aggressive, withdrawn, hyperactive or combative. Behavior is cooperative.        Thought Content: Thought content normal. Thought content is not paranoid or delusional. Thought content does not include homicidal or suicidal ideation.        Cognition and Memory: Cognition and memory normal.        Judgment: Judgment normal.   Mental Status Exam: General Appearance: Casual  Orientation:   Full (Time, Place, and Person)  Memory:   WDL  Concentration:  Concentration: Good and Attention Span: Good  Recall:  Good  Attention  Fair  Eye Contact:  Fair  Speech:  Clear and Coherent and Normal Rate  Language:  Fair  Volume:  Normal  Mood: Depressed  Affect:  Appropriate  Thought Process:  Coherent, Goal Directed, and Linear  Thought Content:  Logical  Suicidal Thoughts:  No  Homicidal Thoughts:  No  Judgement:  Intact  Insight:  Present  Psychomotor Activity:  Normal  Akathisia:  No  Fund of Knowledge:  Fair      Assets:  Manufacturing systems engineer Desire for Improvement Financial Resources/Insurance Housing Leisure Time Resilience Social Support Talents/Skills Transportation Vocational/Educational  Cognition:  WNL  ADL's:  Intact  AIMS (if indicated):   0     Other History   These have been pulled in through the EMR, reviewed, and updated if appropriate.  Family History:  The patient's family history includes Cirrhosis in her mother; Colon cancer in her maternal grandmother; Dementia in her mother; Liver disease in her mother; Ovarian cancer in her maternal aunt; Prostate cancer in her father.  Medical History: Past Medical History:  Diagnosis Date   Arthritis    "knees" (09/06/2017)   Bullous pemphigus    Cat bite 06/2014   to left elbow  History of blood transfusion 1988   "when I had my baby"   Muscle weakness of lower extremity 2001; 09/05/2017   "resolved after a couple weeks; ?" (09/06/2017)   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HCC)    Poisoning, snake bite 04/08/2016   "[place]; RUE"   PONV (postoperative nausea and vomiting)    S/P right knee surgery    Situational anxiety    Staph infection ~ 2015   "left elbow and finger"   Thyroid mass     Surgical History: Past Surgical History:  Procedure Laterality Date   APPENDECTOMY  ~ 1987   APPLICATION OF A-CELL OF EXTREMITY Left 08/05/2015   Procedure: APPLICATION OF A-CELL OF EXTREMITY;  Surgeon:  Peggye Form, DO;  Location: [facility];  Service: Plastics;  Laterality: Left;   BIOPSY  10/26/2023   Procedure: BIOPSY;  Surgeon: [name], MD;  [facility];  Service: Endoscopy;;   BREAST SURGERY Right 1990   "milk duct taken out"   CHONDROPLASTY Right 02/19/2019   Procedure: CHONDROPLASTY; EXCISION EXOSTOSIS;  Surgeon: Valeria Batman, MD;  Location: [facility];  Service: Orthopedics;  Laterality: Right;   COLONOSCOPY N/A 12/22/2023   Procedure: COLONOSCOPY;  Surgeon: [name], MD;  [facility];  Service: Endoscopy;  Laterality: N/A;   DEBRIDEMENT AND CLOSURE WOUND Left 07/01/2015   Procedure: LEFT ELBOW EXCISION OF WOUND WITH PRIMARY CLOSURE 2X5 CM ;  Surgeon: Peggye Form, DO;  Location: [facility];  Service: Plastics;  Laterality: Left;   ELBOW SURGERY Left X 23 in [place] <06/2015   from a cat bite; all I&D   ESOPHAGOGASTRODUODENOSCOPY N/A 12/14/2023   Procedure: EGD (ESOPHAGOGASTRODUODENOSCOPY);  Surgeon: [name], MD;  [facility];  Service: Endoscopy;  Laterality: N/A;   ESOPHAGOGASTRODUODENOSCOPY N/A 12/19/2023   Procedure: EGD (ESOPHAGOGASTRODUODENOSCOPY);  Surgeon: Marguerita Merles, Reuel Boom, MD;  [facility];  Service: Gastroenterology;  Laterality: N/A;   ESOPHAGOGASTRODUODENOSCOPY (EGD) WITH PROPOFOL N/A 10/26/2023   Procedure: ESOPHAGOGASTRODUODENOSCOPY (EGD) WITH PROPOFOL;  Surgeon: [name], MD;  [facility];  Service: Endoscopy;  Laterality: N/A;   GIVENS CAPSULE STUDY N/A 12/22/2023   Procedure: IMAGING PROCEDURE, GI TRACT, INTRALUMINAL, VIA CAPSULE;  Surgeon: [name], MD;  [facility];  Service: Endoscopy;  Laterality: N/A;   HOT HEMOSTASIS  12/14/2023   Procedure: EGD, WITH ARGON PLASMA COAGULATION;  Surgeon: [name], MD;  [facility];  Service: Endoscopy;;   I & D EXTREMITY Left  07/08/2015   Procedure: IRRIGATION AND DEBRIDEMENT EXTREMITY, DRAINAGE OF LEFT ARM WOUND, A-CELL PLACEMENT, WOUND VAC PLACEMENT;  Surgeon: Alena Bills Dillingham, DO;  Location: [facility];  Service: Plastics;  Laterality: Left;   INCISION AND DRAINAGE OF WOUND Left 08/05/2015   Procedure: IRRIGATION AND DEBRIDEMENT LEFT ELBOW WOUND, PLACEMENT OF ACELL;  Surgeon: Peggye Form, DO;  Location: [facility];  Service: Plastics;  Laterality: Left;   KNEE ARTHROSCOPY WITH MEDIAL MENISECTOMY Right 02/19/2019   Procedure: RIGHT KNEE ARTHROSCOPY, DEBRIDEMENT, PARTIAL MEDIAL AND LATERAL MENISECTOMY;  Surgeon: Valeria Batman, MD;  Location: [facility];  Service: Orthopedics;  Laterality: Right;   LAPAROSCOPIC CHOLECYSTECTOMY  1998   SKIN GRAFT Left 2016   took from anterior thigh; placed at elbow   TONSILLECTOMY  ~ 2000   TOTAL ABDOMINAL HYSTERECTOMY  2003   WRIST SURGERY Right 01/2016     Medications:  Current Facility-Administered Medications:    acetaminophen (TYLENOL) tablet 650 mg, 650 mg, Oral, Q6H, Arrien, Mauricio Daniel, MD, 650 mg at 12/25/23 1237   butalbital-acetaminophen-caffeine (FIORICET) 50-325-40 MG per tablet 1 tablet, 1 tablet, Oral, Daily PRN, Elgergawy, Ardia Kraft, MD, 1 tablet at 12/22/23 1359   diazepam (VALIUM) tablet 10 mg, 10 mg, Oral, Q12H PRN, Elgergawy, Dawood S, MD, 10 mg at 12/25/23 1000   DULoxetine (CYMBALTA) DR capsule 60 mg, 60 mg, Oral, Daily, Arrien, Mauricio Daniel, MD, 60 mg at 12/25/23 1478   gabapentin (NEURONTIN) capsule 600 mg, 600 mg, Oral, TID, Arrien, Mauricio Daniel, MD, 600 mg at 12/25/23 1512   HYDROmorphone (DILAUDID) injection 0.5 mg, 0.5 mg, Intravenous, Q4H PRN, Elgergawy, Dawood S, MD, 0.5 mg at 12/25/23 1512   influenza vac split trivalent PF (FLULAVAL) injection 0.5 mL, 0.5 mL, Intramuscular, Tomorrow-1000, Elgergawy, Ardia Kraft, MD   oxyCODONE (Oxy IR/ROXICODONE) immediate release tablet 5 mg, 5 mg, Oral, Q6H PRN,  Elgergawy, Dawood S, MD, 5 mg at 12/25/23 0829   pantoprazole (PROTONIX) EC tablet 40 mg, 40 mg, Oral, BID AC, Delman Ferns, NP   promethazine (PHENERGAN) tablet 12.5 mg, 12.5 mg, Oral, Q6H PRN, Arrien, Mauricio Daniel, MD, 12.5 mg at 12/25/23 1000   rosuvastatin (CRESTOR) tablet 20 mg, 20 mg, Oral, Daily, Elgergawy, Dawood S, MD, 20 mg at 12/25/23 0829   sucralfate (CARAFATE) tablet 1 g, 1 g, Oral, QID, Elgergawy, Dawood S, MD, 1 g at 12/25/23 1238  Allergies: Allergies  Allergen Reactions   Droperidol Palpitations and Other (See Comments)    Elevates BP increases HR   Ondansetron Hives, Swelling and Rash    Spots around IV site IV Zofran   Benzonatate Rash and Other (See Comments)    Caused burning pain in eyes  Watery eyes  Vision changes  Conjunctivitis   Bromfed Dm [Pseudoeph-Bromphen-Dm] Anxiety   Diclofenac Sodium Rash   Flexeril [Cyclobenzaprine] Anxiety    Volanda Gruber, NP

## 2023-12-25 NOTE — Progress Notes (Signed)
 Gastroenterology Progress Note     Patient ID: Sabrina [name]; [id number]; 09/30/1965    Subjective   Pain from pelvis to umbilicus. Felt a pop in abdomen this morning. Felt worse after popping. Worsening pain. Has been radiating to the RUQ but the worst part is the lower abdomen. Constant pain. Dilaudid not helping. Received 0.5 mg Dilaudid IV at 1000. Normal BM this morning, no urinary issues. Saw blood this morning with vomiting. Bright red blood about size of silver dollar. States she flushed this before thinking. Received valium this morning. Pain medication not helping. Scared.  No abdominal distension.   Pain getting worse instead of better. Tearful. States she has hx of anxiety and PTSD. Feels like she is going to have a panic attack.    Objective   Vital signs in last 24 hours Temp:  [98.2 F (36.8 C)-98.6 F (37 C)] 98.5 F (36.9 C) (04/14 0412) Pulse Rate:  [79-94] 79 (04/14 0412) Resp:  [18] 18 (04/14 0412) BP: (96-118)/(68-74) 97/68 (04/14 0412) SpO2:  [96 %-100 %] 96 % (04/14 0412) Last BM Date : 12/24/23  Physical Exam General:   Alert and oriented, tearful, anxious. Actively crying.  Head:  Normocephalic and atraumatic. Eyes:  No icterus, sclera clear. Conjuctiva pink.  Abdomen:  Bowel sounds present, soft, non-distended, no rebound or guarding, slight TTP lower abdomen Neurologic:  Alert and  oriented x4.  Intake/Output from previous day: 04/13 0701 - 04/14 0700 In: 120 [P.O.:120] Out: -  Intake/Output this shift: No intake/output data recorded.  Lab Results  Recent Labs    12/23/23 0422  WBC 2.9*  HGB 10.3*  HCT 32.1*  PLT 231   BMET Recent Labs    12/23/23 0422 12/24/23 0449  NA 138 139  K 3.4* 3.9  CL 105 107  CO2 24 25  GLUCOSE 77 88  BUN 7 13  CREATININE 0.78 0.96  CALCIUM 8.7* 8.8*     Studies/Results CT Angio Abd/Pel w/ and/or w/o Result Date: 12/21/2023 CLINICAL DATA:  Emesis, lower gastrointestinal bleeding EXAM: CTA  ABDOMEN AND PELVIS WITHOUT AND WITH CONTRAST TECHNIQUE: Multidetector CT imaging of the abdomen and pelvis was performed using the standard protocol during bolus administration of intravenous contrast. Multiplanar reconstructed images and MIPs were obtained and reviewed to evaluate the vascular anatomy. RADIATION DOSE REDUCTION: This exam was performed according to the departmental dose-optimization program which includes automated exposure control, adjustment of the mA and/or kV according to patient size and/or use of iterative reconstruction technique. CONTRAST:  OMNIPAQUE IOHEXOL 350 MG/ML SOLN COMPARISON:  10/26/2023 FINDINGS: VASCULAR Aorta: Normal caliber aorta without aneurysm, dissection, vasculitis or significant stenosis. Celiac: Patent without evidence of aneurysm, dissection, vasculitis or significant stenosis. SMA: Patent without evidence of aneurysm, dissection, vasculitis or significant stenosis. Renals: Both renal arteries are patent without evidence of aneurysm, dissection, vasculitis, fibromuscular dysplasia or significant stenosis. There is an accessory right renal artery supplying the lower pole the right kidney, which is widely patent and unremarkable. IMA: Patent without evidence of aneurysm, dissection, vasculitis or significant stenosis. Inflow: Patent without evidence of aneurysm, dissection, vasculitis or significant stenosis. Proximal Outflow: Bilateral common femoral and visualized portions of the superficial and profunda femoral arteries are patent without evidence of aneurysm, dissection, vasculitis or significant stenosis. Veins: Venous structures are unremarkable. Review of the MIP images confirms the above findings. NON-VASCULAR Lower chest: Scattered bibasilar scarring or atelectasis. No acute pleural or parenchymal lung disease. Hepatobiliary: Stable multiple hepatic cysts. Liver is otherwise unremarkable.  Prior cholecystectomy. No biliary duct dilation. Pancreas:  Unremarkable. No pancreatic ductal dilatation or surrounding inflammatory changes. Spleen: Normal in size without focal abnormality. Adrenals/Urinary Tract: Adrenal glands are unremarkable. Kidneys are normal, without renal calculi, focal lesion, or hydronephrosis. Bladder is unremarkable. Stomach/Bowel: No bowel obstruction or ileus. Moderate stool throughout the colon. No bowel wall thickening or inflammatory change. Prior appendectomy. Multiple hemostasis clips are seen within the stomach. There is no evidence of contrast accumulation within the bowel lumen to suggest active gastrointestinal hemorrhage. Lymphatic: No pathologic adenopathy. Reproductive: Status post hysterectomy. No adnexal masses. Other: No free fluid or free intraperitoneal gas. No abdominal wall hernia. Musculoskeletal: No acute or destructive bony abnormalities. Reconstructed images demonstrate no additional findings. IMPRESSION: VASCULAR 1. No evidence of active gastrointestinal bleeding. 2. No significant vascular finding. NON-VASCULAR 1. No acute intra-abdominal or intrapelvic process.  Stable exam. Electronically Signed   By: Sharlet Salina M.D.   On: 12/21/2023 15:21   ECHOCARDIOGRAM COMPLETE Result Date: 12/15/2023    ECHOCARDIOGRAM REPORT   Patient Name:   Sabrina [name] Date of Exam: 12/15/2023 Medical Rec #:  [id number]     Height:       64.0 in Accession #:    [id number]    Weight:       177.0 lb Date of Birth:  [birth year]    BSA:          1.858 m Patient Age:    57 years      BP:           102/76 mmHg Patient Gender: F             HR:           100 bpm. [facility] Procedure: 2D Echo, Cardiac Doppler and Color Doppler (Both Spectral and Color            Flow Doppler were utilized during procedure). Indications:    Dyspnea R06.00  History:        Patient has no prior history of Echocardiogram examinations.                 Arrythmias:Tachycardia; Risk Factors:Dyslipidemia.  Sonographer:    Celesta Gentile RCS Referring  Phys: [phone] DAVID TAT IMPRESSIONS  1. Left ventricular ejection fraction, by estimation, is 55 to 60%. The left ventricle has normal function. The left ventricle has no regional wall motion abnormalities. Left ventricular diastolic parameters are consistent with Grade I diastolic dysfunction (impaired relaxation).  2. Right ventricular systolic function is normal. The right ventricular size is normal.  3. The mitral valve is normal in structure. No evidence of mitral valve regurgitation. No evidence of mitral stenosis.  4. The aortic valve is tricuspid. Aortic valve regurgitation is not visualized. No aortic stenosis is present.  5. The inferior vena cava is normal in size with greater than 50% respiratory variability, suggesting right atrial pressure of 3 mmHg. FINDINGS  Left Ventricle: Left ventricular ejection fraction, by estimation, is 55 to 60%. The left ventricle has normal function. The left ventricle has no regional wall motion abnormalities. The left ventricular internal cavity size was normal in size. There is  no left ventricular hypertrophy. Left ventricular diastolic parameters are consistent with Grade I diastolic dysfunction (impaired relaxation). Normal left ventricular filling pressure. Right Ventricle: The right ventricular size is normal. Right vetricular wall thickness was not well visualized. Right ventricular systolic function is normal. Left Atrium: Left atrial size was normal in size. Right Atrium: Right  atrial size was normal in size. Pericardium: There is no evidence of pericardial effusion. Mitral Valve: The mitral valve is normal in structure. No evidence of mitral valve regurgitation. No evidence of mitral valve stenosis. Tricuspid Valve: The tricuspid valve is normal in structure. Tricuspid valve regurgitation is not demonstrated. No evidence of tricuspid stenosis. Aortic Valve: The aortic valve is tricuspid. Aortic valve regurgitation is not visualized. No aortic stenosis is present.  Aortic valve mean gradient measures 2.4 mmHg. Aortic valve peak gradient measures 5.1 mmHg. Aortic valve area, by VTI measures 1.95 cm. Pulmonic Valve: The pulmonic valve was not well visualized. Pulmonic valve regurgitation is not visualized. No evidence of pulmonic stenosis. Aorta: The aortic root is normal in size and structure. Venous: The inferior vena cava is normal in size with greater than 50% respiratory variability, suggesting right atrial pressure of 3 mmHg. IAS/Shunts: The interatrial septum is aneurysmal. No atrial level shunt detected by color flow Doppler.  LEFT VENTRICLE PLAX 2D LVIDd:         4.80 cm   Diastology LVIDs:         3.10 cm   LV e' medial:    6.09 cm/s LV PW:         0.80 cm   LV E/e' medial:  5.9 LV IVS:        0.90 cm   LV e' lateral:   9.14 cm/s LVOT diam:     1.70 cm   LV E/e' lateral: 3.9 LV SV:         38 LV SV Index:   21 LVOT Area:     2.27 cm  RIGHT VENTRICLE RV S prime:     11.40 cm/s TAPSE (M-mode): 1.2 cm LEFT ATRIUM             Index        RIGHT ATRIUM           Index LA diam:        3.70 cm 1.99 cm/m   RA Area:     14.60 cm LA Vol (A2C):   53.1 ml 28.59 ml/m  RA Volume:   38.70 ml  20.83 ml/m LA Vol (A4C):   37.3 ml 20.08 ml/m LA Biplane Vol: 47.3 ml 25.46 ml/m  AORTIC VALVE AV Area (Vmax):    1.84 cm AV Area (Vmean):   2.07 cm AV Area (VTI):     1.95 cm AV Vmax:           113.17 cm/s AV Vmean:          72.420 cm/s AV VTI:            0.196 m AV Peak Grad:      5.1 mmHg AV Mean Grad:      2.4 mmHg LVOT Vmax:         91.80 cm/s LVOT Vmean:        66.000 cm/s LVOT VTI:          0.169 m LVOT/AV VTI ratio: 0.86  AORTA Ao Root diam: 3.20 cm MITRAL VALVE MV Area (PHT): 2.32 cm    SHUNTS MV Decel Time: 327 msec    Systemic VTI:  0.17 m MV E velocity: 36.10 cm/s  Systemic Diam: 1.70 cm MV A velocity: 66.40 cm/s MV E/A ratio:  0.54 Dina Rich MD Electronically signed by Dina Rich MD Signature Date/Time: 12/15/2023/2:12:54 PM    Final    US Abdomen Limited RUQ  (LIVER/GB) Result Date: 12/14/2023 CLINICAL  DATA:  Cirrhosis EXAM: ULTRASOUND ABDOMEN LIMITED RIGHT UPPER QUADRANT COMPARISON:  Ultrasound 11/03/2023.  CT 10/26/2023. FINDINGS: Gallbladder: Prior cholecystectomy Common bile duct: Diameter: 6 mm Liver: Homogeneous hepatic parenchyma. Multiple cystic areas are identified as on previous examination. There is a large area anteriorly measuring 7 cm. Smaller foci also identified. A few these have multiple septa. Portal vein is patent on color Doppler imaging with normal direction of blood flow towards the liver. Other: None. IMPRESSION: Prior cholecystectomy. No ductal dilatation. Numerous hepatic cysts are present, some of which appear mildly complex by ultrasound. Please correlate with prior CT scan. Electronically Signed   By: Adrianna Horde M.D.   On: 12/14/2023 16:26    Assessment  58 y.o. 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  Abdominal pain: now noting worsening from baseline pain after she heard a "pop" in abdomen this morning. NO distension. Pain located from pubis to umbilicus. Requesting additional Dilaudid despite receiving 0.5 mg IV at 1000 and oral Valium 10 mg. Notably, she had home medication in purse at bedside overnight, which was given to the pharmacy to be stored. Extensive evaluation thus far with several EGDs, colonoscopy, CTA with patent mesenteric vasculature, capsule normal, RUQ US  with hepatic cysts. Concern for drug seeking. Ordered stat abdominal xray. Abdominal exam benign. Surgery to see patient to consider laparoscopy. Outpatient GYN eval for endometriosis.   GI bleed: known esophagitis, gastritis, AVMs s/p APC, ulcers 4/8 on EGD possibly post-APC therapy, capsule without abnormalities, colonoscopy normal  TI and internal hemorrhoids. Reports small volume hematemesis this morning but was not witnessed. Stat H/H this morning. Doubt significant GI bleed and has multiple sources for small volume hematemesis in setting of vomiting.   Anxiety: tearful, known hx of PTSD. Discussed with hospitalist. Would benefit from psych televisit while inpatient, which hospitalist will order.     Plan / Recommendations  Abdominal xray, H/H, CMP Discussed with Dr. Sunnie England. Recommend psych consult while inpatient. He will arrange this Appreciate surgery seeing patient this admission Recommend outpatient GYN evaluation Increase PPI to BID.  Follow-up pending gastrin level.  Continue carafate    LOS: 3 days    12/25/2023, 9:28 AM  Delman Ferns, PhD, ANP-BC [facility]

## 2023-12-25 NOTE — Progress Notes (Signed)
 Progress Note   Patient: Sabrina [name]:[id number] DOB: 01/21/1966 DOA: 12/21/2023     3 DOS: the patient was seen and examined on 12/25/2023   Brief hospital course: Sabrina [name] was admitted to the hospital with the working diagnosis of abdominal pain to rule out lower GI bleed.   58 yo female with the past medical history of anxiety, depression, hyperlipidemia, GERD with multiple admissions for lower GI bleed. Recent hospitalization 04/08 to 04/09 for GI bleed, she underwent upper endoscopy, finding gastric ulcers. She had localized treatment and was discharged on pantoprazole.  At home patient had severe abdominal pain, associated with melena and hematochezia, prompting her to come back to the ED. On her initial physical examination her blood pressure was 112/90, HR 72, RR 18 and 02 saturation 100%. Lungs with no wheezing or rhonchi, heart with S1 and S2 present and regular with no gallops rubs or murmurs, abdomen with no distention and non tender, no lower extremity edema.   04/11 GI was consulted and patient underwent colonoscopy, with no signs of active bleeding.  04/12 capsule endoscopy with no abnormalities found in the gastrointestinal lining except some prominent vessels. No evidence of overt or old bleeding.  04/13 plan to change venlafaxine to duloxetine for better pain control. 04/14 consulted general surgery and psychiatry. Patient continue to have abdominal pain.   Assessment and Plan: * Acute gastric ulcer with hemorrhage Colonoscopy with normal ileum, non bleeding internal hemorrhoids.  No evidence of old or active bleeding in the entire colon and ileum examined.  Capsule endoscopy with no bleeding.   Plan to continue supportive medical therapy.  Continue sucralfate and pantoprazole   Acute on chronic abdominal pain likely is multifactorial, likely neuro- gut axis related. Continue with oxycodone and as needed IV hydromorphone.  Scheduled acetaminophen and gabapentin.   Continue as needed diazepam and duloxetine.   Consulted general surgery and psychiatry, plan for possible transition to only oral analgesics in preparation for discharge home. She will have to follow up with pain clinic as outpatient.   Anxiety Depression  Continue diazepam, changed to duloxetine to address chronic pain.  Likely her uncontrolled pain is related to depression and or anxiety, consulted psychiatry for further evaluation.   Bullous pemphigoid Follow up as outpatient.   Chronic headaches Continue pain control with acetaminophen.   Dyslipidemia Continue with statin therapy  Obesity, class 1 Calculated BMI is 30,0   Hypokalemia Electrolytes have been corrected with K at 3,9 and serum bicarbonate at 25  Na 139. Patient is tolerating po well.        Subjective: Patient continue to complain of lower abdominal pain, improved only with IV hydromorphone. No nausea or vomiting. Possible taking her owen meds last night.   Physical Exam: Vitals:   12/24/23 0438 12/24/23 1316 12/24/23 2005 12/25/23 0412  BP: (!) 109/57 96/71 118/74 97/68  Pulse: 70 86 94 79  Resp: 15  18 18   Temp: 98.1 F (36.7 C) 98.6 F (37 C) 98.2 F (36.8 C) 98.5 F (36.9 C)  TempSrc:   Oral Oral  SpO2: 93% 99% 100% 96%  Weight:      Height:       Neurology awake and alert, she does not look in significant pain or distress ENT with no pallor or icterus  Cardiovascular with S1 and S2 present and regular with no gallops, rubs or murmurs Respiratory with no rales or wheezing Abdomen with soft with no guarding or rigidity, complains of pain on  superficial palpation  No lower extremity edema   Data Reviewed:    Family Communication: no family at the bedside   Disposition: Status is: Inpatient Remains inpatient appropriate because: persistent abdominal pain   Planned Discharge Destination: Home     Author: Albertus Alt, MD 12/25/2023 12:10 PM  For on call review  [email or web address].

## 2023-12-25 NOTE — Telephone Encounter (Signed)
 Spoke with patient on phone. She is in patient at hospital and she is aware to speak with nurse at hospital

## 2023-12-25 NOTE — Consult Note (Addendum)
 Reason for Consult: Lower abdominal pain Referring Physician: Dr. Maurie Boettcher is an 58 y.o. female.  HPI: Patient is a 58 year old white female with a longstanding history of chronic abdominal pain, GI bleed, history of gastric and duodenal angioectasias, and reflux esophagitis who initially presented on 12/19/2023 with worsening abdominal pain and episodes of hematemesis.  These were addressed by GI.  This morning, she felt a pop in the suprapubic region of her abdomen.  She did not cough or strain at the time.  She states that this is a new pain compared to her previous chronic abdominal pain.  She has also had an extensive workup including CT angio which was negative for mesenteric ischemia.  She states she is tender along the lower aspect of her abdomen.  She is status post C-sections, abdominal hysterectomy, and laparoscopic cholecystectomy in the remote past.  She currently denies nausea.  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HCC)    Poisoning, snake bite 04/08/2016   "[place]; RUE"   PONV (postoperative nausea and vomiting)    S/P right knee surgery    Situational anxiety    Staph infection ~ 2015   "left elbow and finger"   Thyroid mass     Past Surgical History:  Procedure Laterality Date   APPENDECTOMY  ~ 1987   APPLICATION OF A-CELL OF EXTREMITY Left 08/05/2015   Procedure: APPLICATION OF A-CELL OF EXTREMITY;  Surgeon: Peggye Form, DO;  Location: [facility];  Service: Plastics;  Laterality: Left;   BIOPSY  10/26/2023   Procedure: BIOPSY;  Surgeon: [name], MD;  [facility];  Service: Endoscopy;;   BREAST SURGERY Right 1990   "milk duct  taken out"   CHONDROPLASTY Right 02/19/2019   Procedure: CHONDROPLASTY; EXCISION EXOSTOSIS;  Surgeon: Valeria Batman, MD;  Location: [facility];  Service: Orthopedics;  Laterality: Right;   COLONOSCOPY N/A 12/22/2023   Procedure: COLONOSCOPY;  Surgeon: [name], MD;  [facility];  Service: Endoscopy;  Laterality: N/A;   DEBRIDEMENT AND CLOSURE WOUND Left 07/01/2015   Procedure: LEFT ELBOW EXCISION OF WOUND WITH PRIMARY CLOSURE 2X5 CM ;  Surgeon: Peggye Form, DO;  Location: [facility];  Service: Plastics;  Laterality: Left;   ELBOW SURGERY Left X 23 in [place] <06/2015   from a cat bite; all I&D   ESOPHAGOGASTRODUODENOSCOPY N/A 12/14/2023   Procedure: EGD (ESOPHAGOGASTRODUODENOSCOPY);  Surgeon: [name], MD;  [facility];  Service: Endoscopy;  Laterality: N/A;   ESOPHAGOGASTRODUODENOSCOPY N/A 12/19/2023   Procedure: EGD (ESOPHAGOGASTRODUODENOSCOPY);  Surgeon: Marguerita Merles, Reuel Boom, MD;  [facility];  Service: Gastroenterology;  Laterality: N/A;   ESOPHAGOGASTRODUODENOSCOPY (EGD) WITH PROPOFOL N/A 10/26/2023   Procedure: ESOPHAGOGASTRODUODENOSCOPY (EGD) WITH PROPOFOL;  Surgeon: [name], MD;  [facility];  Service: Endoscopy;  Laterality: N/A;   GIVENS CAPSULE STUDY N/A 12/22/2023   Procedure: IMAGING PROCEDURE, GI TRACT, INTRALUMINAL, VIA CAPSULE;  Surgeon: [name], MD;  [facility];  Service: Endoscopy;  Laterality: N/A;   HOT HEMOSTASIS  12/14/2023   Procedure:  EGD, WITH ARGON PLASMA COAGULATION;  Surgeon: Hargis Lias, MD;  [facility];  Service: Endoscopy;;   I & D EXTREMITY Left 07/08/2015   Procedure: IRRIGATION AND DEBRIDEMENT EXTREMITY, DRAINAGE OF LEFT ARM WOUND, A-CELL PLACEMENT, WOUND VAC PLACEMENT;  Surgeon: Lindaann Requena Dillingham, DO;  Location: [facility];  Service: Plastics;  Laterality: Left;   INCISION AND DRAINAGE OF WOUND Left 08/05/2015    Procedure: IRRIGATION AND DEBRIDEMENT LEFT ELBOW WOUND, PLACEMENT OF ACELL;  Surgeon: Thornell Flirt, DO;  Location: [facility];  Service: Plastics;  Laterality: Left;   KNEE ARTHROSCOPY WITH MEDIAL MENISECTOMY Right 02/19/2019   Procedure: RIGHT KNEE ARTHROSCOPY, DEBRIDEMENT, PARTIAL MEDIAL AND LATERAL MENISECTOMY;  Surgeon: Shirlee Dotter, MD;  Location: [facility];  Service: Orthopedics;  Laterality: Right;   LAPAROSCOPIC CHOLECYSTECTOMY  1998   SKIN GRAFT Left 2016   took from anterior thigh; placed at elbow   TONSILLECTOMY  ~ 2000   TOTAL ABDOMINAL HYSTERECTOMY  2003   WRIST SURGERY Right 01/2016    Family History  Problem Relation Age of Onset   Liver disease Mother    Dementia Mother    Cirrhosis Mother    Prostate cancer Father    Colon cancer Maternal Grandmother    Ovarian cancer Maternal Aunt     Social History:  reports that she has never smoked. She has never used smokeless tobacco. She reports that she does not currently use alcohol. She reports that she does not use drugs.  Allergies:  Allergies  Allergen Reactions   Droperidol Palpitations and Other (See Comments)    Elevates BP increases HR   Ondansetron Hives, Swelling and Rash    Spots around IV site IV Zofran   Benzonatate Rash and Other (See Comments)    Caused burning pain in eyes  Watery eyes  Vision changes  Conjunctivitis   Bromfed Dm [Pseudoeph-Bromphen-Dm] Anxiety   Diclofenac Sodium Rash   Flexeril [Cyclobenzaprine] Anxiety    Medications: I have reviewed the patient's current medications.  Results for orders placed or performed during the hospital encounter of 12/21/23 (from the past 48 hours)  Basic metabolic panel with GFR     Status: Abnormal   Collection Time: 12/24/23  4:49 AM  Result Value Ref Range   Sodium 139 135 - 145 mmol/L   Potassium 3.9 3.5 - 5.1 mmol/L   Chloride 107 98 - 111 mmol/L   CO2 25 22 - 32 mmol/L   Glucose, Bld 88 70 - 99  mg/dL    Comment: Glucose reference range applies only to samples taken after fasting for at least 8 hours.   BUN 13 6 - 20 mg/dL   Creatinine, Ser 1.61 0.44 - 1.00 mg/dL   Calcium 8.8 (L) 8.9 - 10.3 mg/dL   GFR, Estimated >09 >60 mL/min    Comment: (NOTE) Calculated using the CKD-EPI Creatinine Equation (2021)    Anion gap 7 5 - 15    Comment: Performed at [facility], [address], [place], [place] [address]  Hemoglobin and hematocrit, blood     Status: Abnormal   Collection Time: 12/25/23 11:15 AM  Result Value Ref Range   Hemoglobin 11.4 (L) 12.0 - 15.0 g/dL   HCT 81.1 (L) 91.4 - 78.2 %    Comment: Performed at [facility], [address] [place], [place] [address]  Comprehensive metabolic panel     Status: Abnormal   Collection Time: 12/25/23 11:15 AM  Result Value Ref  Range   Sodium 139 135 - 145 mmol/L   Potassium 4.0 3.5 - 5.1 mmol/L   Chloride 104 98 - 111 mmol/L   CO2 26 22 - 32 mmol/L   Glucose, Bld 101 (H) 70 - 99 mg/dL    Comment: Glucose reference range applies only to samples taken after fasting for at least 8 hours.   BUN 11 6 - 20 mg/dL   Creatinine, Ser 1.61 0.44 - 1.00 mg/dL   Calcium 9.3 8.9 - 09.6 mg/dL   Total Protein 6.6 6.5 - 8.1 g/dL   Albumin 3.7 3.5 - 5.0 g/dL   AST 28 15 - 41 U/L   ALT 19 0 - 44 U/L   Alkaline Phosphatase 94 38 - 126 U/L   Total Bilirubin 0.3 0.0 - 1.2 mg/dL   GFR, Estimated >04 >54 mL/min    Comment: (NOTE) Calculated using the CKD-EPI Creatinine Equation (2021)    Anion gap 9 5 - 15    Comment: Performed at [facility], [address] [place], [place] [address]    DG Abd 2 Views Result Date: 12/25/2023 CLINICAL DATA:  Lower abdominal pain. Vomiting for the past 2 days. Status post esophagogastroduodenoscopy on 12/19/2023. Previous appendectomy. EXAM: ABDOMEN - 2 VIEW COMPARISON:  Abdomen and pelvis CT dated 12/19/2023 FINDINGS: Normal bowel-gas pattern without free peritoneal air. Normal amount of stool. Stable bilateral  upper and right lower quadrant surgical clips. Interval endoscopy capsule in the right inferior pelvis. Stable mild thoracolumbar scoliosis and degenerative changes IMPRESSION: 1. No acute abnormality. 2. Interval endoscopy capsule in the right inferior pelvis, most likely in the distal small bowel or cecum. Electronically Signed   By: Catherin Closs M.D.   On: 12/25/2023 11:31    ROS:  Pertinent items are noted in HPI.  Blood pressure 106/76, pulse 76, temperature 97.8 F (36.6 C), temperature source Oral, resp. rate 14, height 5\' 4"  (1.626 m), weight 79.4 kg, SpO2 98%. Physical Exam: Well-developed and well-nourished.  Alert and oriented x 3. HEENT examination unremarkable Lungs clear to auscultation with equal breath sounds bilaterally Heart examination reveals regular rate and rhythm without S3, S4, murmurs Abdomen soft with tenderness noted along the lower abdomen deep to a low transverse Pfannenstiel incision.  No hernias were appreciated.  It was difficult to appreciate point tenderness in the lower abdomen.  Her abdomen is otherwise soft and flat.  No distention noted.  No rigidity noted.  CT scan images personally reviewed Hospital course reviewed  Assessment/Plan: Impression: Acute onset of lower abdominal pain of unknown etiology.  This may be muscle strain due to previous surgery.  I do not appreciate a hernia.  I do not have a surgical explanation for her multiple complaints. Plan: There is nothing to offer from the surgical standpoint.  I doubt a diagnostic laparoscopy would be helpful at this time.  This was explained to the patient.  Will follow with you.  Alanda Allegra 12/25/2023, 1:34 PM

## 2023-12-25 NOTE — Progress Notes (Signed)
 Home medication that was found at bedside  sent to pharmacy in bag number [phone]

## 2023-12-25 NOTE — Plan of Care (Signed)
 Pt received oral pain medication X1 with night time sch medication. Since this patient has been resting without complaint of pain. Patient did not fully arouse with 4AM vital sine. This care nurse woke patient up with some difficulty. Pt took AM tylenol and went back to sleeping. Pupils dilated  with lights on. Care nurse asked patient if it was ok for me to look in her purse? And patient agreeable. Home medication found in the purse (includes allergy medication, gabapentin, diazepam, and Fioricet. All medication counted and sent in a secure medication bag with security and house supervisor Elba Greathouse at bedside. Pharmacy stored medication form signed by patient, this RN, and witnessed by [facility] placed  in the chart   Patient fully awake during this interaction.   Problem: Education: Goal: Knowledge of General Education information will improve Description: Including pain rating scale, medication(s)/side effects and non-pharmacologic comfort measures Outcome: Progressing   Problem: Clinical Measurements: Goal: Ability to maintain clinical measurements within normal limits will improve Outcome: Progressing Goal: Will remain free from infection Outcome: Progressing Goal: Cardiovascular complication will be avoided Outcome: Progressing   Problem: Activity: Goal: Risk for activity intolerance will decrease Outcome: Progressing   Problem: Coping: Goal: Level of anxiety will decrease Outcome: Progressing   Problem: Elimination: Goal: Will not experience complications related to bowel motility Outcome: Progressing   Problem: Pain Managment: Goal: General experience of comfort will improve and/or be controlled Outcome: Progressing   Problem: Safety: Goal: Ability to remain free from injury will improve Outcome: Progressing

## 2023-12-25 NOTE — Telephone Encounter (Signed)
 Patient currently admitted to hospital and is aware to speak with nurse at hospital

## 2023-12-25 NOTE — TOC Initial Note (Signed)
 Transition of Care [facility]) - Initial/Assessment Note    Patient Details  Name: Sabrina [name] MRN: [id number] Date of Birth: 07/18/1966  [facility] CM/SW Contact:    Ander Katos, LCSW Phone Number: 12/25/2023, 9:05 AM  Clinical Narrative:  Pt admitted for hematemesis. Assessment completed due to high risk readmission score. Pt reports she lives alone and is independent with ADLs. She works full-time. Pt has transportation. No needs reported at this time. TOC will follow.                  Expected Discharge Plan: Home/Self Care Barriers to Discharge: Continued Medical Work up   Patient Goals and CMS Choice Patient states their goals for this hospitalization and ongoing recovery are:: return home      ownership interest in [facility].provided to::  (n/a)    Expected Discharge Plan and Services In-house Referral: Clinical Social Work     Living arrangements for the past 2 months: Single Family Home                                      Prior Living Arrangements/Services Living arrangements for the past 2 months: Single Family Home Lives with:: Self Patient language and need for interpreter reviewed:: Yes Do you feel safe going back to the place where you live?: Yes      Need for Family Participation in Patient Care: No (Comment)     Criminal Activity/Legal Involvement Pertinent to Current Situation/Hospitalization: No - Comment as needed  Activities of Daily Living   ADL Screening (condition at time of admission) Independently performs ADLs?: Yes (appropriate for developmental age) Is the patient deaf or have difficulty hearing?: No Does the patient have difficulty seeing, even when wearing glasses/contacts?: No Does the patient have difficulty concentrating, remembering, or making decisions?: No  Permission Sought/Granted                  Emotional Assessment     Affect (typically observed):  Appropriate Orientation: : Oriented to Place, Oriented to Self, Oriented to  Time, Oriented to Situation Alcohol / Substance Use: Not Applicable Psych Involvement: No (comment)  Admission diagnosis:  Hematemesis [K92.0] Abdominal pain [R10.9] Patient Active Problem List   Diagnosis Date Noted   Hypokalemia 12/23/2023   Dyslipidemia 12/22/2023   Obesity, class 1 12/22/2023   Abdominal pain 12/22/2023   Periumbilical abdominal pain 12/20/2023   Acute gastric ulcer with hemorrhage 12/20/2023   Angiectasia of gastrointestinal tract 12/14/2023   Intractable vomiting 12/13/2023   Mixed hyperlipidemia 12/13/2023   GI bleed 10/27/2023   Hematemesis 10/26/2023   Gastritis and gastroduodenitis 10/26/2023   Primary osteoarthritis of left knee 06/16/2020   Effusion, left knee 06/16/2020   Loose body in knee, left knee 06/16/2020   Vitamin D deficiency 03/24/2020   Hyperlipidemia 03/24/2020   Pain in left foot 01/21/2020   Primary osteoarthritis of right knee 08/06/2019   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   Other meniscus derangements, posterior horn of medial meniscus, left knee 02/19/2019   Bucket handle tear of lateral meniscus 02/19/2019   Unilateral primary osteoarthritis, right knee 12/13/2018   Panic disorder 12/13/2018   Chronic headaches 12/13/2018   Weakness 09/06/2017   Depression 09/06/2017   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   Generalized anxiety disorder with panic attacks 09/06/2017   Right hand pain    Wrist swelling    Cellulitis  of right upper extremity 04/11/2016   Elbow pain 07/20/2015   Anxiety 07/20/2015   Tachycardia 07/04/2015   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HCC)    Skin ulcer of upper arm, limited to breakdown of skin (HCC) 07/01/2015   PCP:  Ruel Cotta, Heinz Llano, MD Pharmacy:   [phone] - [place] - [address] [facility] [address] [place] [address] Phone: [phone] Fax: [phone]     Social Drivers of Health  (SDOH) Social History: SDOH Screenings   Food Insecurity: No Food Insecurity (12/21/2023)  Recent Concern: Food Insecurity - Food Insecurity Present (12/19/2023)  Housing: Low Risk  (12/21/2023)  Recent Concern: Housing - High Risk (12/19/2023)  Transportation Needs: No Transportation Needs (12/21/2023)  Utilities: Not At Risk (12/21/2023)  Depression (PHQ2-9): Medium Risk (01/24/2020)  Financial Resource Strain: High Risk (06/05/2022)   [facility]12[facility] prior to 11/12/2022.  Physical Activity: Inactive (06/05/2022)   [facility] prior to 11/12/[facility] visits prior to 11/12/2022.  Social Connections: Moderately Isolated (12/21/2023)  Stress: Stress Concern Present (06/05/2022)   [facility] prior to 11/12/2022., [facility] visits prior to 11/12/2022.  Tobacco Use: Low Risk  (12/22/2023)   SDOH Interventions:     Readmission Risk Interventions    12/25/2023    9:01 AM  Readmission Risk Prevention Plan  Transportation Screening Complete  HRI or Home Care Consult Complete  Social Work Consult for Recovery Care Planning/Counseling Complete  Palliative Care Screening Not Applicable  Medication Review Oceanographer) Complete

## 2023-12-25 NOTE — Progress Notes (Signed)
 Patient called to have me look at "vomit" in her toilet, there was only "blood" no bile. Made MD aware.

## 2023-12-26 ENCOUNTER — Other Ambulatory Visit: Payer: Self-pay | Admitting: Gastroenterology

## 2023-12-26 ENCOUNTER — Telehealth: Payer: Self-pay | Admitting: Gastroenterology

## 2023-12-26 DIAGNOSIS — F331 Major depressive disorder, recurrent, moderate: Secondary | ICD-10-CM

## 2023-12-26 DIAGNOSIS — R1030 Lower abdominal pain, unspecified: Secondary | ICD-10-CM

## 2023-12-26 DIAGNOSIS — F411 Generalized anxiety disorder: Secondary | ICD-10-CM

## 2023-12-26 DIAGNOSIS — K254 Chronic or unspecified gastric ulcer with hemorrhage: Secondary | ICD-10-CM | POA: Diagnosis not present

## 2023-12-26 DIAGNOSIS — K449 Diaphragmatic hernia without obstruction or gangrene: Secondary | ICD-10-CM | POA: Diagnosis not present

## 2023-12-26 DIAGNOSIS — F419 Anxiety disorder, unspecified: Secondary | ICD-10-CM | POA: Diagnosis not present

## 2023-12-26 DIAGNOSIS — D649 Anemia, unspecified: Secondary | ICD-10-CM | POA: Diagnosis not present

## 2023-12-26 DIAGNOSIS — F410 Panic disorder [episodic paroxysmal anxiety] without agoraphobia: Secondary | ICD-10-CM

## 2023-12-26 DIAGNOSIS — K648 Other hemorrhoids: Secondary | ICD-10-CM | POA: Diagnosis not present

## 2023-12-26 DIAGNOSIS — L120 Bullous pemphigoid: Secondary | ICD-10-CM | POA: Diagnosis not present

## 2023-12-26 DIAGNOSIS — K250 Acute gastric ulcer with hemorrhage: Secondary | ICD-10-CM | POA: Diagnosis not present

## 2023-12-26 DIAGNOSIS — R519 Headache, unspecified: Secondary | ICD-10-CM | POA: Diagnosis not present

## 2023-12-26 LAB — CBC
HCT: 31.5 % — ABNORMAL LOW (ref 36.0–46.0)
Hemoglobin: 10.1 g/dL — ABNORMAL LOW (ref 12.0–15.0)
MCH: 30.1 pg (ref 26.0–34.0)
MCHC: 32.1 g/dL (ref 30.0–36.0)
MCV: 94 fL (ref 80.0–100.0)
Platelets: 228 10*3/uL (ref 150–400)
RBC: 3.35 MIL/uL — ABNORMAL LOW (ref 3.87–5.11)
RDW: 14.8 % (ref 11.5–15.5)
WBC: 3.5 10*3/uL — ABNORMAL LOW (ref 4.0–10.5)
nRBC: 0 % (ref 0.0–0.2)

## 2023-12-26 MED ORDER — TRAMADOL HCL 50 MG PO TABS: 50.0000 mg | ORAL_TABLET | Freq: Four times a day (QID) | ORAL | Status: DC | PRN

## 2023-12-26 MED ORDER — DICYCLOMINE HCL 10 MG PO CAPS: 10.0000 mg | ORAL_CAPSULE | Freq: Three times a day (TID) | ORAL | 1 refills | Status: DC | PRN

## 2023-12-26 MED ORDER — DULOXETINE HCL 60 MG PO CPEP: 60.0000 mg | ORAL_CAPSULE | Freq: Every day | ORAL | 0 refills | Status: DC

## 2023-12-26 MED ORDER — PROMETHAZINE HCL 12.5 MG PO TABS: 12.5000 mg | ORAL_TABLET | Freq: Four times a day (QID) | ORAL | 0 refills | Status: DC | PRN

## 2023-12-26 MED ORDER — TRAZODONE HCL 100 MG PO TABS: 100.0000 mg | ORAL_TABLET | Freq: Every evening | ORAL | 0 refills | Status: DC | PRN

## 2023-12-26 MED ORDER — DIAZEPAM 10 MG PO TABS: 10.0000 mg | ORAL_TABLET | Freq: Three times a day (TID) | ORAL | Status: DC | PRN

## 2023-12-26 MED ORDER — TRAMADOL HCL 50 MG PO TABS: 50.0000 mg | ORAL_TABLET | Freq: Four times a day (QID) | ORAL | 0 refills | Status: DC | PRN

## 2023-12-26 NOTE — Plan of Care (Signed)
  Problem: Education: Goal: Knowledge of General Education information will improve Description: Including pain rating scale, medication(s)/side effects and non-pharmacologic comfort measures Outcome: Progressing   Problem: Health Behavior/Discharge Planning: Goal: Ability to manage health-related needs will improve Outcome: Progressing   Problem: Clinical Measurements: Goal: Ability to maintain clinical measurements within normal limits will improve Outcome: Progressing Goal: Diagnostic test results will improve Outcome: Progressing Goal: Respiratory complications will improve Outcome: Progressing   Problem: Activity: Goal: Risk for activity intolerance will decrease Outcome: Progressing   Problem: Coping: Goal: Level of anxiety will decrease Outcome: Progressing   Problem: Elimination: Goal: Will not experience complications related to bowel motility Outcome: Progressing Goal: Will not experience complications related to urinary retention Outcome: Progressing   Problem: Pain Managment: Goal: General experience of comfort will improve and/or be controlled Outcome: Progressing

## 2023-12-26 NOTE — Progress Notes (Signed)
 Subjective: Vomited blood x 2 yesterday. 1st episode vomited a blood clot, bright red. The second episode, she vomited bright red blood only. States she didn't retch or anything, just came out of the blue. Both episodes in the morning. Some nausea, but making herself eat. Last vomited a couple days prior to yesterday.   No melena. Reports lat BM was this morning. States she has been having normal bowel movements. Last episode of rectal bedding was prior to colonoscopy.   Abdominal pain between her belly button and pubic bone. This started right before presenting to the hospital. Hasn't really changed since presentation. Constant. Fluctuates in severity. No specific triggers or relieving factors. Not affected by meals or bowel movements. When standing up straight, pain seems to be worse. 7/10 in severity. Pain medications really not helping much.    Objective: Vital signs in last 24 hours: Temp:  [97.8 F (36.6 C)-98.2 F (36.8 C)] 98.2 F (36.8 C) (04/15 0514) Pulse Rate:  [76-99] 84 (04/15 0948) Resp:  [14-19] 16 (04/15 0948) BP: (88-113)/(62-85) 99/63 (04/15 0948) SpO2:  [93 %-98 %] 97 % (04/15 0948) Last BM Date : 12/25/23 General:   Alert and oriented, pleasant, NAD.  Head:  Normocephalic and atraumatic. Eyes:  No icterus, sclera clear. Conjuctiva pink.  Abdomen:  Bowel sounds present, soft, non-distended. Minimal TTP across upper abdomen. Mild TTP in suprapubic region. No rebound or guarding.  Msk:  Symmetrical without gross deformities. Normal posture. Extremities:  Without edema. Neurologic:  Alert and  oriented x4;  grossly normal neurologically. Skin:  Warm and dry, intact without significant lesions.  Psych:  Normal mood and affect.  Intake/Output from previous day: 04/14 0701 - 04/15 0700 In: 600 [P.O.:600] Out: -  Intake/Output this shift: No intake/output data recorded.  Lab Results: Recent Labs    12/25/23 1115  HGB 11.4*  HCT 35.3*   BMET Recent Labs     12/24/23 0449 12/25/23 1115  NA 139 139  K 3.9 4.0  CL 107 104  CO2 25 26  GLUCOSE 88 101*  BUN 13 11  CREATININE 0.96 0.87  CALCIUM 8.8* 9.3   LFT Recent Labs    12/25/23 1115  PROT 6.6  ALBUMIN 3.7  AST 28  ALT 19  ALKPHOS 94  BILITOT 0.3    Studies/Results: DG Abd 2 Views Result Date: 12/25/2023 CLINICAL DATA:  Lower abdominal pain. Vomiting for the past 2 days. Status post esophagogastroduodenoscopy on 12/19/2023. Previous appendectomy. EXAM: ABDOMEN - 2 VIEW COMPARISON:  Abdomen and pelvis CT dated 12/19/2023 FINDINGS: Normal bowel-gas pattern without free peritoneal air. Normal amount of stool. Stable bilateral upper and right lower quadrant surgical clips. Interval endoscopy capsule in the right inferior pelvis. Stable mild thoracolumbar scoliosis and degenerative changes IMPRESSION: 1. No acute abnormality. 2. Interval endoscopy capsule in the right inferior pelvis, most likely in the distal small bowel or cecum. Electronically Signed   By: Beckie Salts M.D.   On: 12/25/2023 11:31    Assessment: 58 y.o. year old female with history of arthritis, bullous pemphigus, osteomyelitis, anxiety, hepatic cyst, hematemesis in the setting of esophagitis, gastric and duodenal angioectasias s/p APC in April 2025, now re-admitted on 12/19/23 with recurrent hematemesis, rectal bleeding, and abdominal pain.  Initially discharged on 4/9, but returned on 4/10 with abdominal pain and rectal bleeding.   Recurrent GI bleed:  -Presented with recurrent hematemesis and new onset bright red blood per rectum 12/19/23. Hgb 11.2 on admission, down from 12.2.  -  EGD 12/19/23 with 1 cm hiatal hernia, 8 cratered gastric ulcers, two oozing s/p injection and clipping. Ulcers suspected to be postablative ulcerations.  Normal examined duodenum. - CT angio A/P 12/19/23 with no evidence of active GI bleeding.  No significant vascular findings.  No other acute intra-abdominal or intrapelvic process.  She did have  moderate stool throughout the colon. - Colonoscopy 12/22/23 with nonbleeding internal hemorrhoids, otherwise normal exam. - Capsule endoscopy 12/23/2023 with no abnormalities except some prominent vessels.  No evidence of overt or old bleeding. - Fasting gastrin 31.  - No recurrent rectal bleeding since colonoscopy.  - Recurrent hematemesis x 2 yesterday morning. No hematemesis/emesis today. Hematemesis could be secondary to oozing from known PUD.  It is notable, that she was only receiving pantoprazole 40 mg daily since readmission on 4/10, but this was increased to BID yesterday.  - Hgb had declined as low as 9.4, but had been slowly improving, up to 11.4 yesterday.  - Will recheck Hgb today. If declining and patient experiences recurrent hematemesis, may need to consider repeat EGD.    Lower abdominal pain:  Etiology unclear. Extensive evaluation thus far with complete GI endoscopic evaluation, CT angio A/P, DG abdomen without explanation. Nothing to suggest GI origin at this time. Query adhesions related to prior abdominal surgeries vs endometriosis due to history of endometriosis. We had recommended surgical and GYN evaluation.  General surgery consult 4/14.  Suspected muscle strain related to prior surgery.  Stated no surgical explanation for multiple complaints and doubted diagnostic laparoscopy would be helpful.   Unfortunately, she continues to report 7/10 abdominal pain not really improved by opioid pain medications. Could consider trial of antispasmodic vs TCA though TCA would take a few weeks for response. Will need to discuss with Dr. Sammi Crick. Otherwise, would follow-up outpatient with GYN.    Plan: UA, CBC Monitor for recurrent overt GI bleeding. Could consider repeat EGD if recurrent hematemesis and significant decline in Hgb.  Continue PPI BID Continue Carafate QID.  Could consider trial of antispasmodic or TCA for abdominal pain. Will discuss with Dr. Castaneda.  Needs  outpatient follow-up with GYN for endometriosis evaluation.    LOS: 4 days    12/26/2023, 10:01 AM   Shana Daring, [facility]

## 2023-12-26 NOTE — Telephone Encounter (Signed)
 Patient discharged from the hospital today. Recommended repeat CBC in 1 week and arrange hospital follow-up in 2 weeks.   Dx: GI bleed, anemia, lower abdominal pain.   Courtney:  Can you arrange CBC and also let patient know I spoke with Dr. Sammi Crick. We can try her on dicyclomine to see if this will help with her abdominal pain. She can take every 6-8 hours as needed, up to 3 times a day. Monitor for constipation. If decreased bowel frequency, can hold medication or add MiraLAX 17 g in 8 oz of water daily.   I will send Rx of dicyclomine to her pharmacy.

## 2023-12-26 NOTE — Discharge Summary (Addendum)
 Physician Discharge Summary   Patient: Sabrina [name] MRN: [id number] DOB: 24-Jan-1966  Admit date:     12/21/2023  Discharge date: 12/26/23  Discharge Physician: York Ram Angeleen Horney   PCP: Randel Pigg, Dorma Russell, MD   Recommendations at discharge:   Patient has been placed on as needed tramadol for pain control and as needed phenergan for nausea control.  Changed venlafaxine to duloxetine for better pain control. Plan to follow up with psychiatry as outpatient  Follow up with Dr. Sharion Dove in 7 to 10 days Follow up with GI as scheduled.   Discharge Diagnoses: Active Problems:   Acute gastric ulcer with hemorrhage   Bullous pemphigoid   Chronic headaches   Dyslipidemia   Obesity, class 1   Hypokalemia   Generalized anxiety disorder with panic attacks   Panic disorder   Abdominal pain   MDD (major depressive disorder), recurrent episode, moderate (HCC)  Resolved Problems:   Anxiety  Hospital Course: Sabrina [name] was admitted to the hospital with the working diagnosis of abdominal pain to rule out lower GI bleed.   58 yo female with the past medical history of anxiety, depression, hyperlipidemia, GERD with multiple admissions for lower GI bleed. Recent hospitalization 04/08 to 04/09 for GI bleed, she underwent upper endoscopy, finding gastric ulcers. She had localized treatment and was discharged on pantoprazole.  At home patient had severe abdominal pain, associated with melena and hematochezia, prompting her to come back to the ED. On her initial physical examination her blood pressure was 112/90, HR 72, RR 18 and 02 saturation 100%. Lungs with no wheezing or rhonchi, heart with S1 and S2 present and regular with no gallops rubs or murmurs, abdomen with no distention and non tender, no lower extremity edema.   Na 138, K 3,0 Cl 103 bicarbonate 26 glucose 84 bun 20 cr 0,96  AST 25 ALT 20  Wbc 3,7 hgb 9,4 plt 212   04/11 GI was consulted and patient underwent colonoscopy, with no  signs of active bleeding.  04/12 capsule endoscopy with no abnormalities found in the gastrointestinal lining except some prominent vessels. No evidence of overt or old bleeding.  04/13 plan to change venlafaxine to duloxetine for better pain control. 04/14 consulted general surgery and psychiatry. Patient continue to have abdominal pain.  04/15 her abdominal pain has improved, she continue tolerating po well.  Plan to follow up with primary care and psychiatry as outpatient,   Assessment and Plan: Acute gastric ulcer with hemorrhage Colonoscopy with normal ileum, non bleeding internal hemorrhoids.  No evidence of old or active bleeding in the entire colon and ileum examined.  Capsule endoscopy with no bleeding.   Patient was placed on pantoprazole and sucralfate.   Acute on chronic abdominal pain likely is multifactorial, likely neuro- gut axis related. Patient required po and IV hydromorphone as needed for pain control.  Scheduled acetaminophen and gabapentin.  As needed diazepam and duloxetine.   Consulted general surgery and psychiatry, with recommendations to continue medical therapy and follow up as outpatient.   Anxiety-resolved as of 12/25/2023 Depression  Continue diazepam, changed to duloxetine to address chronic pain.  Likely her uncontrolled pain is related to depression and or anxiety, consulted psychiatry for further evaluation.   Bullous pemphigoid Follow up as outpatient.   Chronic headaches Continue pain control with acetaminophen.   Dyslipidemia Continue with statin therapy  Obesity, class 1 Calculated BMI is 30,0   Hypokalemia Electrolytes have been corrected with K at 3,9 and serum bicarbonate at  25  Na 139. Patient is tolerating po well.    MDD (major depressive disorder), recurrent episode, moderate (HCC) Generalized anxiety disorder with panic attacks.   Patient was evaluated by psychiatry, plan to continue duloxetine and diazepam.  Trazodone at  night for sleep.  Follow up with psychiatry as outpatient.  Will check EKG prior to discharge to follow up on qtc interval        Consultants: gastroenterology, general surgery and psychiatry  Procedures performed: colonoscopy and capsule endoscopy   Disposition: Home Diet recommendation:  Regular diet DISCHARGE MEDICATION: Allergies as of 12/26/2023       Reactions   Droperidol Palpitations, Other (See Comments)   Elevates BP increases HR   Ondansetron Hives, Swelling, Rash   Spots around IV site IV Zofran   Benzonatate Rash, Other (See Comments)   Caused burning pain in eyes  Watery eyes  Vision changes  Conjunctivitis   Bromfed Dm [pseudoeph-bromphen-dm] Anxiety   Diclofenac Sodium Rash   Flexeril [cyclobenzaprine] Anxiety        Medication List     STOP taking these medications    chlorpheniramine-HYDROcodone 10-8 MG/5ML Commonly known as: TUSSIONEX   oxyCODONE 5 MG immediate release tablet Commonly known as: Oxy IR/ROXICODONE   venlafaxine XR 150 MG 24 hr capsule Commonly known as: EFFEXOR-XR       TAKE these medications    acetaminophen 325 MG tablet Commonly known as: TYLENOL Take 325-650 mg by mouth every 6 (six) hours as needed for mild pain (pain score 1-3).   butalbital-acetaminophen-caffeine 50-325-40 MG tablet Commonly known as: FIORICET Take 1 tablet by mouth daily as needed for headache or migraine.   diazepam 10 MG tablet Commonly known as: VALIUM Take 1 tablet (10 mg total) by mouth 3 (three) times daily as needed for anxiety. What changed:  how much to take how to take this when to take this reasons to take this additional instructions   DULoxetine 60 MG capsule Commonly known as: CYMBALTA Take 1 capsule (60 mg total) by mouth daily. Start taking on: December 27, 2023   Ferrex 150 150 MG capsule Generic drug: iron polysaccharides Take 150 mg by mouth daily.   gabapentin 300 MG capsule Commonly known as: NEURONTIN Take 2  capsules (600 mg total) by mouth 3 (three) times daily.   pantoprazole 40 MG tablet Commonly known as: PROTONIX Take 1 tablet (40 mg total) by mouth 2 (two) times daily.   phentermine 37.5 MG tablet Commonly known as: ADIPEX-P Take 37.5 mg by mouth 3 (three) times a week.   promethazine 12.5 MG tablet Commonly known as: PHENERGAN Take 1 tablet (12.5 mg total) by mouth every 6 (six) hours as needed for nausea or vomiting.   rosuvastatin 20 MG tablet Commonly known as: CRESTOR Take 1 tablet by mouth daily.   sucralfate 1 g tablet Commonly known as: Carafate Take 1 tablet (1 g total) by mouth 4 (four) times daily.   traMADol 50 MG tablet Commonly known as: ULTRAM Take 1 tablet (50 mg total) by mouth every 6 (six) hours as needed for severe pain (pain score 7-10).   traZODone 100 MG tablet Commonly known as: DESYREL Take 1 tablet (100 mg total) by mouth at bedtime as needed for sleep.        Discharge Exam: Filed Weights   12/21/23 1756 12/22/23 0912  Weight: 83.6 kg 79.4 kg   BP 99/63 (BP Location: Left Arm)   Pulse 84   Temp 98.2 F (36.8  C) (Oral)   Resp 16   Ht 5\' 4"  (1.626 m)   Wt 79.4 kg   SpO2 97%   BMI 30.04 kg/m   Patient is feeling better, no nausea or vomiting, she is tolerating po well and her abdominal pain has improved. No chest pain or dyspnea   Neurology awake and alert ENT with mild pallor with no icterus Cardiovascular with S1 and S2 present and regular with no gallops  Respiratory with no rales or wheezing, no rhonchi  Abdomen is soft and non distended, no guarding or rebound, non tender to superficial palpation  No lower extremity edema No rashes    Condition at discharge: stable  The results of significant diagnostics from this hospitalization (including imaging, microbiology, ancillary and laboratory) are listed below for reference.   Imaging Studies: DG Abd 2 Views Result Date: 12/25/2023 CLINICAL DATA:  Lower abdominal pain.  Vomiting for the past 2 days. Status post esophagogastroduodenoscopy on 12/19/2023. Previous appendectomy. EXAM: ABDOMEN - 2 VIEW COMPARISON:  Abdomen and pelvis CT dated 12/19/2023 FINDINGS: Normal bowel-gas pattern without free peritoneal air. Normal amount of stool. Stable bilateral upper and right lower quadrant surgical clips. Interval endoscopy capsule in the right inferior pelvis. Stable mild thoracolumbar scoliosis and degenerative changes IMPRESSION: 1. No acute abnormality. 2. Interval endoscopy capsule in the right inferior pelvis, most likely in the distal small bowel or cecum. Electronically Signed   By: Catherin Closs M.D.   On: 12/25/2023 11:31   CT Angio Abd/Pel w/ and/or w/o Result Date: 12/21/2023 CLINICAL DATA:  Emesis, lower gastrointestinal bleeding EXAM: CTA ABDOMEN AND PELVIS WITHOUT AND WITH CONTRAST TECHNIQUE: Multidetector CT imaging of the abdomen and pelvis was performed using the standard protocol during bolus administration of intravenous contrast. Multiplanar reconstructed images and MIPs were obtained and reviewed to evaluate the vascular anatomy. RADIATION DOSE REDUCTION: This exam was performed according to the departmental dose-optimization program which includes automated exposure control, adjustment of the mA and/or kV according to patient size and/or use of iterative reconstruction technique. CONTRAST:  OMNIPAQUE IOHEXOL 350 MG/ML SOLN COMPARISON:  10/26/2023 FINDINGS: VASCULAR Aorta: Normal caliber aorta without aneurysm, dissection, vasculitis or significant stenosis. Celiac: Patent without evidence of aneurysm, dissection, vasculitis or significant stenosis. SMA: Patent without evidence of aneurysm, dissection, vasculitis or significant stenosis. Renals: Both renal arteries are patent without evidence of aneurysm, dissection, vasculitis, fibromuscular dysplasia or significant stenosis. There is an accessory right renal artery supplying the lower pole the right kidney,  which is widely patent and unremarkable. IMA: Patent without evidence of aneurysm, dissection, vasculitis or significant stenosis. Inflow: Patent without evidence of aneurysm, dissection, vasculitis or significant stenosis. Proximal Outflow: Bilateral common femoral and visualized portions of the superficial and profunda femoral arteries are patent without evidence of aneurysm, dissection, vasculitis or significant stenosis. Veins: Venous structures are unremarkable. Review of the MIP images confirms the above findings. NON-VASCULAR Lower chest: Scattered bibasilar scarring or atelectasis. No acute pleural or parenchymal lung disease. Hepatobiliary: Stable multiple hepatic cysts. Liver is otherwise unremarkable. Prior cholecystectomy. No biliary duct dilation. Pancreas: Unremarkable. No pancreatic ductal dilatation or surrounding inflammatory changes. Spleen: Normal in size without focal abnormality. Adrenals/Urinary Tract: Adrenal glands are unremarkable. Kidneys are normal, without renal calculi, focal lesion, or hydronephrosis. Bladder is unremarkable. Stomach/Bowel: No bowel obstruction or ileus. Moderate stool throughout the colon. No bowel wall thickening or inflammatory change. Prior appendectomy. Multiple hemostasis clips are seen within the stomach. There is no evidence of contrast accumulation within the bowel lumen  to suggest active gastrointestinal hemorrhage. Lymphatic: No pathologic adenopathy. Reproductive: Status post hysterectomy. No adnexal masses. Other: No free fluid or free intraperitoneal gas. No abdominal wall hernia. Musculoskeletal: No acute or destructive bony abnormalities. Reconstructed images demonstrate no additional findings. IMPRESSION: VASCULAR 1. No evidence of active gastrointestinal bleeding. 2. No significant vascular finding. NON-VASCULAR 1. No acute intra-abdominal or intrapelvic process.  Stable exam. Electronically Signed   By: Bobbye Burrow M.D.   On: 12/21/2023 15:21    ECHOCARDIOGRAM COMPLETE Result Date: 12/15/2023    ECHOCARDIOGRAM REPORT   Patient Name:   REEDA SOOHOO Date of Exam: 12/15/2023 Medical Rec #:  [id number]     Height:       64.0 in Accession #:    [id number]    Weight:       177.0 lb Date of Birth:  05/19/1966    BSA:          1.858 m Patient Age:    57 years      BP:           102/76 mmHg Patient Gender: F             HR:           100 bpm. Exam Location:  [facility] Procedure: 2D Echo, Cardiac Doppler and Color Doppler (Both Spectral and Color            Flow Doppler were utilized during procedure). Indications:    Dyspnea R06.00  History:        Patient has no prior history of Echocardiogram examinations.                 Arrythmias:Tachycardia; Risk Factors:Dyslipidemia.  Sonographer:    Denese Finn RCS Referring Phys: [phone] DAVID TAT IMPRESSIONS  1. Left ventricular ejection fraction, by estimation, is 55 to 60%. The left ventricle has normal function. The left ventricle has no regional wall motion abnormalities. Left ventricular diastolic parameters are consistent with Grade I diastolic dysfunction (impaired relaxation).  2. Right ventricular systolic function is normal. The right ventricular size is normal.  3. The mitral valve is normal in structure. No evidence of mitral valve regurgitation. No evidence of mitral stenosis.  4. The aortic valve is tricuspid. Aortic valve regurgitation is not visualized. No aortic stenosis is present.  5. The inferior vena cava is normal in size with greater than 50% respiratory variability, suggesting right atrial pressure of 3 mmHg. FINDINGS  Left Ventricle: Left ventricular ejection fraction, by estimation, is 55 to 60%. The left ventricle has normal function. The left ventricle has no regional wall motion abnormalities. The left ventricular internal cavity size was normal in size. There is  no left ventricular hypertrophy. Left ventricular diastolic parameters are consistent with Grade I diastolic dysfunction  (impaired relaxation). Normal left ventricular filling pressure. Right Ventricle: The right ventricular size is normal. Right vetricular wall thickness was not well visualized. Right ventricular systolic function is normal. Left Atrium: Left atrial size was normal in size. Right Atrium: Right atrial size was normal in size. Pericardium: There is no evidence of pericardial effusion. Mitral Valve: The mitral valve is normal in structure. No evidence of mitral valve regurgitation. No evidence of mitral valve stenosis. Tricuspid Valve: The tricuspid valve is normal in structure. Tricuspid valve regurgitation is not demonstrated. No evidence of tricuspid stenosis. Aortic Valve: The aortic valve is tricuspid. Aortic valve regurgitation is not visualized. No aortic stenosis is present. Aortic valve mean gradient measures 2.4 mmHg. Aortic  valve peak gradient measures 5.1 mmHg. Aortic valve area, by VTI measures 1.95 cm. Pulmonic Valve: The pulmonic valve was not well visualized. Pulmonic valve regurgitation is not visualized. No evidence of pulmonic stenosis. Aorta: The aortic root is normal in size and structure. Venous: The inferior vena cava is normal in size with greater than 50% respiratory variability, suggesting right atrial pressure of 3 mmHg. IAS/Shunts: The interatrial septum is aneurysmal. No atrial level shunt detected by color flow Doppler.  LEFT VENTRICLE PLAX 2D LVIDd:         4.80 cm   Diastology LVIDs:         3.10 cm   LV e' medial:    6.09 cm/s LV PW:         0.80 cm   LV E/e' medial:  5.9 LV IVS:        0.90 cm   LV e' lateral:   9.14 cm/s LVOT diam:     1.70 cm   LV E/e' lateral: 3.9 LV SV:         38 LV SV Index:   21 LVOT Area:     2.27 cm  RIGHT VENTRICLE RV S prime:     11.40 cm/s TAPSE (M-mode): 1.2 cm LEFT ATRIUM             Index        RIGHT ATRIUM           Index LA diam:        3.70 cm 1.99 cm/m   RA Area:     14.60 cm LA Vol (A2C):   53.1 ml 28.59 ml/m  RA Volume:   38.70 ml  20.83 ml/m  LA Vol (A4C):   37.3 ml 20.08 ml/m LA Biplane Vol: 47.3 ml 25.46 ml/m  AORTIC VALVE AV Area (Vmax):    1.84 cm AV Area (Vmean):   2.07 cm AV Area (VTI):     1.95 cm AV Vmax:           113.17 cm/s AV Vmean:          72.420 cm/s AV VTI:            0.196 m AV Peak Grad:      5.1 mmHg AV Mean Grad:      2.4 mmHg LVOT Vmax:         91.80 cm/s LVOT Vmean:        66.000 cm/s LVOT VTI:          0.169 m LVOT/AV VTI ratio: 0.86  AORTA Ao Root diam: 3.20 cm MITRAL VALVE MV Area (PHT): 2.32 cm    SHUNTS MV Decel Time: 327 msec    Systemic VTI:  0.17 m MV E velocity: 36.10 cm/s  Systemic Diam: 1.70 cm MV A velocity: 66.40 cm/s MV E/A ratio:  0.54 Dina Rich MD Electronically signed by Dina Rich MD Signature Date/Time: 12/15/2023/2:12:54 PM    Final    US Abdomen Limited RUQ (LIVER/GB) Result Date: 12/14/2023 CLINICAL DATA:  Cirrhosis EXAM: ULTRASOUND ABDOMEN LIMITED RIGHT UPPER QUADRANT COMPARISON:  Ultrasound 11/03/2023.  CT 10/26/2023. FINDINGS: Gallbladder: Prior cholecystectomy Common bile duct: Diameter: 6 mm Liver: Homogeneous hepatic parenchyma. Multiple cystic areas are identified as on previous examination. There is a large area anteriorly measuring 7 cm. Smaller foci also identified. A few these have multiple septa. Portal vein is patent on color Doppler imaging with normal direction of blood flow towards the liver. Other: None. IMPRESSION: Prior cholecystectomy. No  ductal dilatation. Numerous hepatic cysts are present, some of which appear mildly complex by ultrasound. Please correlate with prior CT scan. Electronically Signed   By: Adrianna Horde M.D.   On: 12/14/2023 16:26    Microbiology: Results for orders placed or performed during the hospital encounter of 10/03/20  SARS CORONAVIRUS 2 (TAT 6-24 HRS) Nasopharyngeal Nasopharyngeal Swab     Status: None   Collection Time: 10/03/20  2:36 PM   Specimen: Nasopharyngeal Swab  Result Value Ref Range Status   SARS Coronavirus 2 NEGATIVE NEGATIVE  Final    Comment: (NOTE) SARS-CoV-2 target nucleic acids are NOT DETECTED.  The SARS-CoV-2 RNA is generally detectable in upper and lower respiratory specimens during the acute phase of infection. Negative results do not preclude SARS-CoV-2 infection, do not rule out co-infections with other pathogens, and should not be used as the sole basis for treatment or other patient management decisions. Negative results must be combined with clinical observations, patient history, and epidemiological information. The expected result is Negative.  Fact Sheet for Patients: HairSlick.no  Fact Sheet for Healthcare Providers: quierodirigir.com  This test is not yet approved or cleared by the [place]  FDA and  has been authorized for detection and/or diagnosis of SARS-CoV-2 by FDA under an Emergency Use Authorization (EUA). This EUA will remain  in effect (meaning this test can be used) for the duration of the COVID-19 declaration under Se ction 564(b)(1) of the Act, 21 U.S.C. section 360bbb-3(b)(1), unless the authorization is terminated or revoked sooner.  Performed at [facility], [address], [place], [place] [address]     Labs: CBC: Recent Labs  Lab 12/20/23 [phone] 12/21/23 1429 12/22/23 0431 12/23/23 0422 12/25/23 1115 12/26/23 1010  WBC 3.3* 3.7* 3.5* 2.9*  --  3.5*  HGB 9.4* 9.4* 10.2* 10.3* 11.4* 10.1*  HCT 30.0* 29.3* 32.6* 32.1* 35.3* 31.5*  MCV 96.2 93.9 96.7 95.0  --  94.0  PLT 206 212 224 231  --  228   Basic Metabolic Panel: Recent Labs  Lab 12/21/23 1429 12/22/23 0431 12/23/23 0422 12/24/23 0449 12/25/23 1115  NA 138 139 138 139 139  K 3.0* 3.8 3.4* 3.9 4.0  CL 103 110 105 107 104  CO2 26 22 24 25 26   GLUCOSE 84 68* 77 88 101*  BUN 20 13 7 13 11   CREATININE 0.96 0.79 0.78 0.96 0.87  CALCIUM 9.6 8.9 8.7* 8.8* 9.3   Liver Function Tests: Recent Labs  Lab 12/21/23 1429 12/25/23 1115   AST 25 28  ALT 20 19  ALKPHOS 98 94  BILITOT 0.4 0.3  PROT 7.3 6.6  ALBUMIN 4.1 3.7   CBG: Recent Labs  Lab 12/20/23 0732  GLUCAP 83    Discharge time spent: greater than 30 minutes.  Signed: Albertus Alt, MD [facility] 12/26/2023

## 2023-12-26 NOTE — Progress Notes (Signed)
 Mobility Specialist Progress Note:    12/26/23 1115  Mobility  Activity Refused mobility   Pt politely refused mobility, eager for d/c. All needs met.  Glinda Lapping Mobility Specialist Please contact via Special educational needs teacher or  Rehab office at [phone]

## 2023-12-26 NOTE — Assessment & Plan Note (Addendum)
 Generalized anxiety disorder with panic attacks.   Patient was evaluated by psychiatry, plan to continue duloxetine and diazepam.  Trazodone at night for sleep.  Follow up with psychiatry as outpatient.  Corrected qt interval on EKG day of discharge is 449 ms.

## 2023-12-26 NOTE — Progress Notes (Signed)
 Pt called out for pain medication around 0315. This care RN was helping another patient a the time. Care RN went to patient's room at 0328 with pain medication but patient was resting comfortably in NAD. Care RN will re-assess pain once patient is awake.

## 2023-12-27 ENCOUNTER — Ambulatory Visit (HOSPITAL_COMMUNITY): Payer: BLUE CROSS/BLUE SHIELD | Admitting: Psychiatry

## 2023-12-27 ENCOUNTER — Encounter: Payer: Self-pay | Admitting: *Deleted

## 2023-12-27 ENCOUNTER — Other Ambulatory Visit: Payer: Self-pay | Admitting: *Deleted

## 2023-12-27 ENCOUNTER — Telehealth (INDEPENDENT_AMBULATORY_CARE_PROVIDER_SITE_OTHER): Payer: Self-pay

## 2023-12-27 DIAGNOSIS — D649 Anemia, unspecified: Secondary | ICD-10-CM

## 2023-12-27 DIAGNOSIS — K922 Gastrointestinal hemorrhage, unspecified: Secondary | ICD-10-CM

## 2023-12-27 DIAGNOSIS — R1030 Lower abdominal pain, unspecified: Secondary | ICD-10-CM

## 2023-12-27 NOTE — Telephone Encounter (Signed)
 Sent a Well[employer] geologist .

## 2023-12-27 NOTE — Telephone Encounter (Addendum)
 Recommend that we cancel the appointment with me on 5/1 and she keep her appointment with Dr. Alita Irwin.  Regarding  her abdominal pain, we discussed this extensively during her hospitalization.  She has had extensive evaluation including CT scan, x-ray, EGD, colonoscopy, capsule endoscopy, and nothing has explain her abdominal pain.  I am not sure that this is GI related.  We did recommend that she see OB/GYN for evaluation of endometriosis.  I did not see that she was getting dicyclomine during her admission.  I recommended starting this yesterday when I saw her in the hospital and she ended up getting discharged yesterday.  I would continue to try the dicyclomine and contact OB/GYN for further evaluation and keep her follow-up with Dr. Alita Irwin on 4/29.

## 2023-12-27 NOTE — Telephone Encounter (Signed)
 Sabrina [name] would you mind please cancelling the appointment the patient had with [name]? Thank You!  Patient made aware to keep the appointment with Dr. Alita Irwin. She was made aware that we had suggested that she reach out to her OBGYN as she has had a full Gi work up and she needed to be evaluated by [facility]. She says she did not currently have a OBGYN. I advised to just call around and see who is taking new patients and if any issues to let us  know and we could make a referral. She is aware to continue with the Bentyl as prescribed and to follow up with getting a OBGYN and to keep her up coming appointment with Dr. Alita Irwin. Patient states understanding.

## 2023-12-27 NOTE — Telephone Encounter (Signed)
 I spoke with the patient and made her aware of Kristen's recommendations of,  Recommended repeat CBC in 1 week. You can go to [facility] and have those done next week. Patient states understanding and will plan to go next week to [facility].      (Per Santel, I spoke with Dr. Sammi Crick. We can try dicyclomine to see if this will help with abdominal pain. You can take every 6-8 hours as needed, up to 3 times a day. Monitor for constipation. If decreased bowel frequency, can hold medication or add MiraLAX 17 g in 8 oz of water daily.)  Patient made aware of these recommendations above, but says she can not get comfortable at night, she is no longer having the Hematemesis,but she is still having issues with severe pain located between her pelvic bone and her umbilical area. She says she is taking the bentyl tid already and is using phenergan prn, along with Tramadol. I advised that she would need to give the bentyl time to work,but says she has been taken it the last few days and they were giving it to her while inpatient and she has not noticed any changes. She would like to know if this pain is to be expected and if so how long?

## 2023-12-27 NOTE — Telephone Encounter (Signed)
 Looks like the patient already has an appointment here at [facility] on 01/09/2024 at 10 am with Dr. Alita Irwin and Ethyl Hering it looks like you have one scheduled with Kristen on 01/11/2024 at 3 pm. (FYI)  I called the patient back and got her vm, I left her a message to call the office back if she still needed assistance.  Patient left a vm on the clinical vm and had questions regarding, " Is this her new normal"? She left the message stating she is still having pain between her pelvic bone and her belly button.

## 2023-12-28 NOTE — Telephone Encounter (Signed)
 I spoke with the patient and made her aware that if she is still having these symptoms of vomiting bright red blood and severe abdominal pain she would need to report to the Ed. Patient states understanding.

## 2023-12-28 NOTE — Telephone Encounter (Signed)
Agree with these recommendations.

## 2023-12-28 NOTE — Telephone Encounter (Signed)
 Patient called and left a Vm that she vomited up bright red blood twice today and is having severe abdominal pain. She reports that she is taking bentyl and tramadol and tylenol and nothing is touching the pain.She left on the vm that she had not ate all day. I called the patient back and she did not pick up the phone. I left a detailed message that she should report back to the ED if having severe abdominal pain and vomiting bright red blood.

## 2024-01-01 NOTE — Anesthesia Postprocedure Evaluation (Signed)
 Anesthesia Post Note  Patient: Sabrina [name]  Procedure(s) Performed: COLONOSCOPY  Patient location during evaluation: Phase II Anesthesia Type: General Level of consciousness: awake Pain management: pain level controlled Vital Signs Assessment: post-procedure vital signs reviewed and stable Respiratory status: spontaneous breathing and respiratory function stable Cardiovascular status: blood pressure returned to baseline and stable Postop Assessment: no headache and no apparent nausea or vomiting Anesthetic complications: no Comments: Late entry   No notable events documented.   Last Vitals:  Vitals:   12/26/23 0516 12/26/23 0948  BP: (!) 88/62 99/63  Pulse: 93 84  Resp:  16  Temp:    SpO2:  97%    Last Pain:  Vitals:   12/26/23 1220  TempSrc:   PainSc: 2                  Coretha Dew

## 2024-01-04 NOTE — Anesthesia Postprocedure Evaluation (Signed)
 Anesthesia Post Note  Patient: DANELLE CURIALE  Procedure(s) Performed: EGD (ESOPHAGOGASTRODUODENOSCOPY)  Patient location during evaluation: Phase II Anesthesia Type: General Level of consciousness: awake Pain management: pain level controlled Vital Signs Assessment: post-procedure vital signs reviewed and stable Respiratory status: spontaneous breathing and respiratory function stable Cardiovascular status: blood pressure returned to baseline and stable Postop Assessment: no headache and no apparent nausea or vomiting Anesthetic complications: no Comments: Late entry   No notable events documented.   Last Vitals:  Vitals:   12/20/23 0327 12/20/23 0643  BP: 96/68 117/74  Pulse: 89 94  Resp: 20   Temp:    SpO2: 100%     Last Pain:  Vitals:   12/20/23 1133  TempSrc:   PainSc: 6                  Coretha Dew

## 2024-01-09 ENCOUNTER — Other Ambulatory Visit (HOSPITAL_COMMUNITY): Payer: Self-pay | Admitting: Psychiatry

## 2024-01-09 ENCOUNTER — Ambulatory Visit (INDEPENDENT_AMBULATORY_CARE_PROVIDER_SITE_OTHER): Admitting: Gastroenterology

## 2024-01-09 MED ORDER — DIAZEPAM 10 MG PO TABS: 10.0000 mg | ORAL_TABLET | Freq: Three times a day (TID) | ORAL | 3 refills | Status: DC | PRN

## 2024-01-11 ENCOUNTER — Ambulatory Visit: Admitting: Gastroenterology

## 2024-01-17 ENCOUNTER — Encounter (HOSPITAL_COMMUNITY): Payer: Self-pay | Admitting: Emergency Medicine

## 2024-01-17 ENCOUNTER — Other Ambulatory Visit: Payer: Self-pay

## 2024-01-17 ENCOUNTER — Observation Stay (HOSPITAL_COMMUNITY)
Admission: EM | Admit: 2024-01-17 | Discharge: 2024-01-19 | Disposition: A | Discharge status: Home / Self Care | Attending: Family Medicine | Admitting: Family Medicine

## 2024-01-17 ENCOUNTER — Emergency Department (HOSPITAL_COMMUNITY)

## 2024-01-17 DIAGNOSIS — F32A Depression, unspecified: Secondary | ICD-10-CM | POA: Diagnosis not present

## 2024-01-17 DIAGNOSIS — F419 Anxiety disorder, unspecified: Secondary | ICD-10-CM | POA: Insufficient documentation

## 2024-01-17 DIAGNOSIS — I780 Hereditary hemorrhagic telangiectasia: Secondary | ICD-10-CM | POA: Diagnosis not present

## 2024-01-17 DIAGNOSIS — Z79899 Other long term (current) drug therapy: Secondary | ICD-10-CM | POA: Insufficient documentation

## 2024-01-17 DIAGNOSIS — K922 Gastrointestinal hemorrhage, unspecified: Secondary | ICD-10-CM | POA: Diagnosis not present

## 2024-01-17 DIAGNOSIS — N809 Endometriosis, unspecified: Secondary | ICD-10-CM

## 2024-01-17 DIAGNOSIS — K259 Gastric ulcer, unspecified as acute or chronic, without hemorrhage or perforation: Secondary | ICD-10-CM | POA: Diagnosis not present

## 2024-01-17 DIAGNOSIS — E785 Hyperlipidemia, unspecified: Secondary | ICD-10-CM | POA: Diagnosis present

## 2024-01-17 DIAGNOSIS — F410 Panic disorder [episodic paroxysmal anxiety] without agoraphobia: Secondary | ICD-10-CM | POA: Diagnosis present

## 2024-01-17 DIAGNOSIS — K31819 Angiodysplasia of stomach and duodenum without bleeding: Secondary | ICD-10-CM | POA: Diagnosis present

## 2024-01-17 DIAGNOSIS — N80C0 Endometriosis of the abdomen, unspecified: Secondary | ICD-10-CM

## 2024-01-17 DIAGNOSIS — K2970 Gastritis, unspecified, without bleeding: Secondary | ICD-10-CM | POA: Diagnosis not present

## 2024-01-17 DIAGNOSIS — F411 Generalized anxiety disorder: Secondary | ICD-10-CM

## 2024-01-17 DIAGNOSIS — K2990 Gastroduodenitis, unspecified, without bleeding: Secondary | ICD-10-CM

## 2024-01-17 DIAGNOSIS — R109 Unspecified abdominal pain: Secondary | ICD-10-CM | POA: Diagnosis present

## 2024-01-17 DIAGNOSIS — K920 Hematemesis: Secondary | ICD-10-CM | POA: Diagnosis not present

## 2024-01-17 DIAGNOSIS — K449 Diaphragmatic hernia without obstruction or gangrene: Secondary | ICD-10-CM | POA: Insufficient documentation

## 2024-01-17 DIAGNOSIS — R519 Headache, unspecified: Secondary | ICD-10-CM | POA: Diagnosis present

## 2024-01-17 LAB — CBC WITH DIFFERENTIAL/PLATELET
Abs Immature Granulocytes: 0.02 10*3/uL (ref 0.00–0.07)
Basophils Absolute: 0 10*3/uL (ref 0.0–0.1)
Basophils Relative: 0 %
Eosinophils Absolute: 0.2 10*3/uL (ref 0.0–0.5)
Eosinophils Relative: 3 %
HCT: 37.1 % (ref 36.0–46.0)
Hemoglobin: 12 g/dL (ref 12.0–15.0)
Immature Granulocytes: 0 %
Lymphocytes Relative: 24 %
Lymphs Abs: 1.4 10*3/uL (ref 0.7–4.0)
MCH: 30.9 pg (ref 26.0–34.0)
MCHC: 32.3 g/dL (ref 30.0–36.0)
MCV: 95.6 fL (ref 80.0–100.0)
Monocytes Absolute: 0.6 10*3/uL (ref 0.1–1.0)
Monocytes Relative: 11 %
Neutro Abs: 3.5 10*3/uL (ref 1.7–7.7)
Neutrophils Relative %: 62 %
Platelets: 253 10*3/uL (ref 150–400)
RBC: 3.88 MIL/uL (ref 3.87–5.11)
RDW: 14.6 % (ref 11.5–15.5)
WBC: 5.7 10*3/uL (ref 4.0–10.5)
nRBC: 0 % (ref 0.0–0.2)

## 2024-01-17 LAB — COMPREHENSIVE METABOLIC PANEL WITH GFR
ALT: 10 U/L (ref 0–44)
AST: 23 U/L (ref 15–41)
Albumin: 4 g/dL (ref 3.5–5.0)
Alkaline Phosphatase: 102 U/L (ref 38–126)
Anion gap: 9 (ref 5–15)
BUN: 20 mg/dL (ref 6–20)
CO2: 22 mmol/L (ref 22–32)
Calcium: 8.9 mg/dL (ref 8.9–10.3)
Chloride: 107 mmol/L (ref 98–111)
Creatinine, Ser: 0.83 mg/dL (ref 0.44–1.00)
GFR, Estimated: >60 mL/min (ref >60)
Glucose, Bld: 107 mg/dL — ABNORMAL HIGH (ref 70–99)
Potassium: 3.6 mmol/L (ref 3.5–5.1)
Sodium: 138 mmol/L (ref 135–145)
Total Bilirubin: 0.7 mg/dL (ref 0.0–1.2)
Total Protein: 7.1 g/dL (ref 6.5–8.1)

## 2024-01-17 LAB — LIPASE, BLOOD: Lipase: 34 U/L (ref 11–51)

## 2024-01-17 LAB — RAPID URINE DRUG SCREEN, HOSP PERFORMED
Amphetamines: NOT DETECTED
Barbiturates: POSITIVE — AB
Benzodiazepines: POSITIVE — AB
Cocaine: NOT DETECTED
Opiates: POSITIVE — AB
Tetrahydrocannabinol: NOT DETECTED

## 2024-01-17 LAB — TYPE AND SCREEN
ABO/RH(D): A POS
Antibody Screen: NEGATIVE

## 2024-01-17 LAB — URINALYSIS, ROUTINE W REFLEX MICROSCOPIC
Bilirubin Urine: NEGATIVE
Glucose, UA: NEGATIVE mg/dL
Hgb urine dipstick: NEGATIVE
Ketones, ur: NEGATIVE mg/dL
Leukocytes,Ua: NEGATIVE
Nitrite: NEGATIVE
Protein, ur: NEGATIVE mg/dL
Specific Gravity, Urine: >1.046 — ABNORMAL HIGH (ref 1.005–1.030)
pH: 5 (ref 5.0–8.0)

## 2024-01-17 LAB — POC OCCULT BLOOD, ED

## 2024-01-17 MED ORDER — SODIUM CHLORIDE 0.9 % IV BOLUS
1000.0000 mL | Freq: Once | INTRAVENOUS | Status: AC
  Administered 2024-01-17: 1000 mL via INTRAVENOUS

## 2024-01-17 MED ORDER — KETOROLAC TROMETHAMINE 15 MG/ML IJ SOLN
15.0000 mg | Freq: Four times a day (QID) | INTRAMUSCULAR | Status: DC | PRN
  Administered 2024-01-17 – 2024-01-19 (×3): 15 mg via INTRAVENOUS
  Filled 2024-01-17 (×4): qty 1

## 2024-01-17 MED ORDER — TRAZODONE HCL 50 MG PO TABS: 50.0000 mg | ORAL_TABLET | Freq: Every evening | ORAL | Status: DC | PRN

## 2024-01-17 MED ORDER — PANTOPRAZOLE SODIUM 40 MG IV SOLR
40.0000 mg | INTRAVENOUS | Status: AC
  Administered 2024-01-17 (×2): 40 mg via INTRAVENOUS
  Filled 2024-01-17 (×2): qty 10

## 2024-01-17 MED ORDER — DIAZEPAM 5 MG PO TABS
10.0000 mg | ORAL_TABLET | Freq: Three times a day (TID) | ORAL | Status: DC | PRN
  Administered 2024-01-17 – 2024-01-19 (×4): 10 mg via ORAL
  Filled 2024-01-17 (×4): qty 2

## 2024-01-17 MED ORDER — SODIUM CHLORIDE 0.9 % IV SOLN
12.5000 mg | INTRAVENOUS | Status: AC
  Administered 2024-01-17: 12.5 mg via INTRAVENOUS
  Filled 2024-01-17: qty 0.5

## 2024-01-17 MED ORDER — BUTALBITAL-APAP-CAFFEINE 50-325-40 MG PO TABS
1.0000 | ORAL_TABLET | Freq: Every day | ORAL | Status: DC | PRN
  Administered 2024-01-19: 1 via ORAL
  Filled 2024-01-17: qty 1

## 2024-01-17 MED ORDER — PROCHLORPERAZINE EDISYLATE 10 MG/2ML IJ SOLN
10.0000 mg | INTRAMUSCULAR | Status: DC | PRN
  Administered 2024-01-17 – 2024-01-19 (×2): 10 mg via INTRAVENOUS
  Filled 2024-01-17 (×2): qty 2

## 2024-01-17 MED ORDER — ACETAMINOPHEN 325 MG PO TABS: 650.0000 mg | ORAL_TABLET | Freq: Four times a day (QID) | ORAL | Status: DC | PRN

## 2024-01-17 MED ORDER — LACTATED RINGERS IV SOLN: INTRAVENOUS | Status: DC

## 2024-01-17 MED ORDER — IOHEXOL 350 MG/ML SOLN
100.0000 mL | Freq: Once | INTRAVENOUS | Status: AC | PRN
  Administered 2024-01-17: 100 mL via INTRAVENOUS

## 2024-01-17 MED ORDER — TRAMADOL HCL 50 MG PO TABS
50.0000 mg | ORAL_TABLET | Freq: Four times a day (QID) | ORAL | Status: DC | PRN
  Administered 2024-01-17 – 2024-01-19 (×4): 50 mg via ORAL
  Filled 2024-01-17 (×4): qty 1

## 2024-01-17 MED ORDER — HYDROMORPHONE HCL 1 MG/ML IJ SOLN
1.0000 mg | Freq: Once | INTRAMUSCULAR | Status: AC
  Administered 2024-01-17: 1 mg via INTRAVENOUS
  Filled 2024-01-17: qty 1

## 2024-01-17 MED ORDER — SUCRALFATE 1 GM/10ML PO SUSP
1.0000 g | Freq: Three times a day (TID) | ORAL | Status: DC
  Administered 2024-01-17 – 2024-01-19 (×6): 1 g via ORAL
  Filled 2024-01-17 (×8): qty 10

## 2024-01-17 MED ORDER — ACETAMINOPHEN 650 MG RE SUPP: 650.0000 mg | Freq: Four times a day (QID) | RECTAL | Status: DC | PRN

## 2024-01-17 MED ORDER — TIZANIDINE HCL 4 MG PO TABS: 4.0000 mg | ORAL_TABLET | Freq: Every evening | ORAL | Status: DC | PRN

## 2024-01-17 MED ORDER — ROSUVASTATIN CALCIUM 20 MG PO TABS
20.0000 mg | ORAL_TABLET | Freq: Every day | ORAL | Status: DC
  Administered 2024-01-17 – 2024-01-19 (×3): 20 mg via ORAL
  Filled 2024-01-17 (×3): qty 1

## 2024-01-17 MED ORDER — POLYSACCHARIDE IRON COMPLEX 150 MG PO CAPS
150.0000 mg | ORAL_CAPSULE | Freq: Every day | ORAL | Status: DC
  Administered 2024-01-18 – 2024-01-19 (×2): 150 mg via ORAL
  Filled 2024-01-17 (×2): qty 1

## 2024-01-17 MED ORDER — GABAPENTIN 300 MG PO CAPS
600.0000 mg | ORAL_CAPSULE | Freq: Three times a day (TID) | ORAL | Status: DC
  Administered 2024-01-17 – 2024-01-18 (×3): 600 mg via ORAL
  Filled 2024-01-17 (×4): qty 2

## 2024-01-17 MED ORDER — PANTOPRAZOLE SODIUM 40 MG IV SOLR
40.0000 mg | Freq: Two times a day (BID) | INTRAVENOUS | Status: DC
  Administered 2024-01-17 – 2024-01-19 (×4): 40 mg via INTRAVENOUS
  Filled 2024-01-17 (×4): qty 10

## 2024-01-17 MED ORDER — DULOXETINE HCL 60 MG PO CPEP
60.0000 mg | ORAL_CAPSULE | Freq: Every day | ORAL | Status: DC
  Administered 2024-01-17 – 2024-01-19 (×3): 60 mg via ORAL
  Filled 2024-01-17 (×3): qty 1

## 2024-01-17 MED ORDER — HYDROMORPHONE HCL 1 MG/ML IJ SOLN
0.5000 mg | Freq: Once | INTRAMUSCULAR | Status: AC
  Administered 2024-01-17: 0.5 mg via INTRAVENOUS
  Filled 2024-01-17: qty 0.5

## 2024-01-17 NOTE — Consult Note (Signed)
 Gastroenterology Consult   Referring Provider: No ref. provider found Primary Care Physician:  Ruel Cotta, Heinz Llano, MD Primary Gastroenterologist:  Dr. Alita Irwin   Patient ID: Sabrina [name]; [id number]; [birth year]   Admit date: 01/17/2024  LOS: 0 days   Date of Consultation: 01/17/2024  Reason for Consultation:  recurrent hematemesis   History of Present Illness   Sabrina [name] is a 58 y.o. year old female  with history of arthritis, bullous pemphigus, osteomyelitis, anxiety, hepatic cyst, hematemesis in the setting of esophagitis, gastric and duodenal angioectasias s/p APC with normal colonoscopy (other than hemorrhoids) and normal capsule endoscopy in April 2025, now returning to the ER today with chief complaint of hematemesis   ED Course: FOBT positive CT ANgio GI Bleed negative Hgb 12 (up from 10.1) 3 weeks ago  BP mildly soft in the ED at 93/73, mildy tachycardic with HR 115   Consult Patient states feeling good since her last discharge. She notes she started having cramping in her abdomen and went to the restroom where she had a BM that was diarrhea (loose not watery) with some specks of BRB noted initially but this transitioned to more volume of blood mixed in with her stools as she had further BMs. She then began feeling nauseated and started having vomiting with bright red emesis with blood clots noted. She reports abdominal pain starts down in lower abdomen up to mid abdomen, periumbilical area, traveling more to the Right side. She is trying to eat healthier, avoiding fried foods. She has a friend who is a nutritionist who has been helping her to improve her diet as well. She denies recent antibiotics. 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 Did have COVID a few weeks ago but reports mild symptoms that only last a few days.   She has been taking protonix  40mg  BID and carafate  at home. Not taking any NSAIDs at home.   States she received  some tramadol  but requesting something else for pain. Nausea continues.   States she was evaluated by gen surgery last admission who did not feel further evaluation by them was warranted. Has been recommended to have evaluation by GYN surgeon previously.   Last EGD: 12/2023  1cm hh, oozing gastric ulcers with oozing hemorrhage, injected, clips placed.  Prior EGDs: 12/14/23: - Normal esophagus.                           - Gastritis. Biopsied.                           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  10/2023 LA Grade A reflux esophagitis.                           - Gastritis. Biopsied.                           - 2 cm hiatal hernia.                           - Duodenal mucosal lymphangiectasia. Last Colonoscopy: April 2025: hemorrhoids Capsule endoscopy: April 2025: negative    Past Medical History:  Diagnosis Date   Arthritis    "knees" (09/06/2017)   Bullous pemphigus    Cat bite  06/2014   to left elbow   History of blood transfusion 1988   "when I had my baby"   Muscle weakness of lower extremity 2001; 09/05/2017   "resolved after a couple weeks; ?" (09/06/2017)   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HCC)    Poisoning, snake bite 04/08/2016   "[place]; RUE"   PONV (postoperative nausea and vomiting)    S/P right knee surgery    Situational anxiety    Staph infection ~ 2015   "left elbow and finger"   Thyroid  mass     Past Surgical History:  Procedure Laterality Date   APPENDECTOMY  ~ 1987   APPLICATION OF A-CELL OF EXTREMITY Left 08/05/2015   Procedure: APPLICATION OF A-CELL OF EXTREMITY;  Surgeon: Thornell Flirt, DO;  Location: [facility];  Service: Plastics;  Laterality: Left;   BIOPSY  10/26/2023   Procedure: BIOPSY;  Surgeon: Hargis Lias, MD;  [facility]: [facility];  Service: Endoscopy;;   BREAST SURGERY Right 1990   "milk duct taken out"   CHONDROPLASTY Right 02/19/2019   Procedure: CHONDROPLASTY; EXCISION EXOSTOSIS;  Surgeon: Shirlee Dotter, MD;  Location: [facility];  Service: Orthopedics;  Laterality: Right;   COLONOSCOPY N/A 12/22/2023   Procedure: COLONOSCOPY;  Surgeon: Hargis Lias, MD;  Location: [facility];  Service: Endoscopy;  Laterality: N/A;   DEBRIDEMENT AND CLOSURE WOUND Left 07/01/2015   Procedure: LEFT ELBOW EXCISION OF WOUND WITH PRIMARY CLOSURE 2X5 CM ;  Surgeon: Thornell Flirt, DO;  Location: [facility];  Service: Plastics;  Laterality: Left;   ELBOW SURGERY Left X 23 in [place]  <06/2015   from a cat bite; all I&D   ESOPHAGOGASTRODUODENOSCOPY N/A 12/14/2023   Procedure: EGD (ESOPHAGOGASTRODUODENOSCOPY);  Surgeon: Hargis Lias, MD;  [facility]: [facility];  Service: Endoscopy;  Laterality: N/A;   ESOPHAGOGASTRODUODENOSCOPY N/A 12/19/2023   Procedure: EGD (ESOPHAGOGASTRODUODENOSCOPY);  Surgeon: Umberto Ganong, Bearl Limes, MD;  [facility]: [facility];  Service: Gastroenterology;  Laterality: N/A;   ESOPHAGOGASTRODUODENOSCOPY (EGD) WITH PROPOFOL  N/A 10/26/2023   Procedure: ESOPHAGOGASTRODUODENOSCOPY (EGD) WITH PROPOFOL ;  Surgeon: Hargis Lias, MD;  [facility]: [facility];  Service: Endoscopy;  Laterality: N/A;   GIVENS CAPSULE STUDY N/A 12/22/2023   Procedure: IMAGING PROCEDURE, GI TRACT, INTRALUMINAL, VIA CAPSULE;  Surgeon: Hargis Lias, MD;  Location: [facility];  Service: Endoscopy;  Laterality: N/A;   HOT HEMOSTASIS  12/14/2023   Procedure: EGD, WITH ARGON PLASMA COAGULATION;  Surgeon: Hargis Lias, MD;  Location: [facility];  Service: Endoscopy;;   I & D EXTREMITY Left 07/08/2015   Procedure: IRRIGATION AND DEBRIDEMENT EXTREMITY, DRAINAGE OF LEFT ARM WOUND, A-CELL PLACEMENT, WOUND VAC PLACEMENT;  Surgeon: Lindaann Requena Dillingham, DO;  Location: [facility];  Service: Plastics;  Laterality: Left;   INCISION AND DRAINAGE OF WOUND Left 08/05/2015   Procedure: IRRIGATION AND DEBRIDEMENT LEFT ELBOW WOUND, PLACEMENT OF ACELL;  Surgeon: Thornell Flirt, DO;   Location: [facility];  Service: Plastics;  Laterality: Left;   KNEE ARTHROSCOPY WITH MEDIAL MENISECTOMY Right 02/19/2019   Procedure: RIGHT KNEE ARTHROSCOPY, DEBRIDEMENT, PARTIAL MEDIAL AND LATERAL MENISECTOMY;  Surgeon: Shirlee Dotter, MD;  Location:  SURGERY CENTER;  Service: Orthopedics;  Laterality: Right;   LAPAROSCOPIC CHOLECYSTECTOMY  1998   SKIN GRAFT Left 2016   took from anterior thigh; placed at elbow   TONSILLECTOMY  ~ 2000   TOTAL ABDOMINAL HYSTERECTOMY  2003   WRIST SURGERY Right 01/2016  Prior to Admission medications   Medication Sig Start Date End Date Taking? Authorizing Provider  acetaminophen  (TYLENOL ) 325 MG tablet Take 325-650 mg by mouth every 6 (six) hours as needed for mild pain (pain score 1-3).    [provider]  butalbital -acetaminophen -caffeine  (FIORICET ) 50-325-40 MG tablet Take 1 tablet by mouth daily as needed for headache or migraine. 11/19/23   [provider]  diazepam  (VALIUM ) 10 MG tablet Take 1 tablet (10 mg total) by mouth 3 (three) times daily as needed for anxiety. 01/09/24   Plovsky, Doroteo Gasmen, MD  dicyclomine  (BENTYL ) 10 MG capsule Take 1 capsule (10 mg total) by mouth 3 (three) times daily as needed for spasms. 12/26/23   Evander Hills, PA-C  DULoxetine  (CYMBALTA ) 60 MG capsule Take 1 capsule (60 mg total) by mouth daily. 12/27/23   Arrien, Curlee Doss, MD  FERREX 150 150 MG capsule Take 150 mg by mouth daily.    [provider]  gabapentin  (NEURONTIN ) 300 MG capsule Take 2 capsules (600 mg total) by mouth 3 (three) times daily. 08/10/21   Zilphia Hilt, Charyl Coppersmith, MD  pantoprazole  (PROTONIX ) 40 MG tablet Take 1 tablet (40 mg total) by mouth 2 (two) times daily. 12/19/23 02/17/24  Shahmehdi, Seyed A, MD  phentermine (ADIPEX-P) 37.5 MG tablet Take 37.5 mg by mouth 3 (three) times a week. 12/07/23   [provider]  promethazine  (PHENERGAN ) 12.5 MG tablet Take 1 tablet (12.5 mg total) by  mouth every 6 (six) hours as needed for nausea or vomiting. 12/26/23   Arrien, Mauricio Daniel, MD  rosuvastatin  (CRESTOR ) 20 MG tablet Take 1 tablet by mouth daily. 06/08/22   [provider]  sucralfate  (CARAFATE ) 1 g tablet Take 1 tablet (1 g total) by mouth 4 (four) times daily. 12/19/23 01/18/24  Bobbetta Burnet, MD  traMADol  (ULTRAM ) 50 MG tablet Take 1 tablet (50 mg total) by mouth every 6 (six) hours as needed for severe pain (pain score 7-10). 12/26/23   Arrien, Mauricio Daniel, MD  traZODone  (DESYREL ) 100 MG tablet Take 1 tablet (100 mg total) by mouth at bedtime as needed for sleep. 12/26/23 01/25/24  Arrien, Mauricio Daniel, MD    Current Facility-Administered Medications  Medication Dose Route Frequency Provider Last Rate Last Admin   acetaminophen  (TYLENOL ) tablet 650 mg  650 mg Oral Q6H PRN Johnson, Clanford L, MD       Or   acetaminophen  (TYLENOL ) suppository 650 mg  650 mg Rectal Q6H PRN Johnson, Clanford L, MD       ketorolac  (TORADOL ) 15 MG/ML injection 15 mg  15 mg Intravenous Q6H PRN Lincoln Renshaw, Clanford L, MD   15 mg at 01/17/24 1135   lactated ringers  infusion   Intravenous Continuous Lincoln Renshaw, Clanford L, MD 125 mL/hr at 01/17/24 1129 New Bag at 01/17/24 1129   pantoprazole  (PROTONIX ) injection 40 mg  40 mg Intravenous Q12H Johnson, Clanford L, MD       prochlorperazine  (COMPAZINE ) injection 10 mg  10 mg Intravenous Q4H PRN Johnson, Clanford L, MD       traMADol  (ULTRAM ) tablet 50 mg  50 mg Oral Q6H PRN Johnson, Clanford L, MD   50 mg at 01/17/24 1124   traZODone  (DESYREL ) tablet 50 mg  50 mg Oral QHS PRN Rayfield Cairo, MD       Current Outpatient Medications  Medication Sig Dispense Refill   acetaminophen  (TYLENOL ) 325 MG tablet Take 325-650 mg by mouth every 6 (six) hours as needed for mild pain (  pain score 1-3).     butalbital -acetaminophen -caffeine  (FIORICET ) 50-325-40 MG tablet Take 1 tablet by mouth daily as needed for headache or migraine.     diazepam   (VALIUM ) 10 MG tablet Take 1 tablet (10 mg total) by mouth 3 (three) times daily as needed for anxiety. 90 tablet 3   dicyclomine  (BENTYL ) 10 MG capsule Take 1 capsule (10 mg total) by mouth 3 (three) times daily as needed for spasms. 90 capsule 1   DULoxetine  (CYMBALTA ) 60 MG capsule Take 1 capsule (60 mg total) by mouth daily. 30 capsule 0   FERREX 150 150 MG capsule Take 150 mg by mouth daily.     gabapentin  (NEURONTIN ) 300 MG capsule Take 2 capsules (600 mg total) by mouth 3 (three) times daily. 540 capsule 1   pantoprazole  (PROTONIX ) 40 MG tablet Take 1 tablet (40 mg total) by mouth 2 (two) times daily. 60 tablet 1   phentermine (ADIPEX-P) 37.5 MG tablet Take 37.5 mg by mouth 3 (three) times a week.     promethazine  (PHENERGAN ) 12.5 MG tablet Take 1 tablet (12.5 mg total) by mouth every 6 (six) hours as needed for nausea or vomiting. 10 tablet 0   rosuvastatin  (CRESTOR ) 20 MG tablet Take 1 tablet by mouth daily.     sucralfate  (CARAFATE ) 1 g tablet Take 1 tablet (1 g total) by mouth 4 (four) times daily. 120 tablet 0   traMADol  (ULTRAM ) 50 MG tablet Take 1 tablet (50 mg total) by mouth every 6 (six) hours as needed for severe pain (pain score 7-10). 10 tablet 0   traZODone  (DESYREL ) 100 MG tablet Take 1 tablet (100 mg total) by mouth at bedtime as needed for sleep. 30 tablet 0    Allergies as of 01/17/2024 - Review Complete 01/17/2024  Allergen Reaction Noted   Droperidol Palpitations and Other (See Comments) 07/01/2015   Ondansetron  Hives, Swelling, and Rash 03/09/2015   Benzonatate  Rash and Other (See Comments) 09/21/2015   Bromfed dm [pseudoeph-bromphen-dm] Anxiety 09/17/2021   Diclofenac  sodium Rash 08/14/2018   Flexeril [cyclobenzaprine] Anxiety 01/18/2017   Review of Systems   Gen: Denies any fever, chills, loss of appetite, change in weight or weight loss CV: Denies chest pain, heart palpitations, syncope, edema  Resp: Denies shortness of breath with rest, cough, wheezing,  coughing up blood, and pleurisy. GI: +diarrhea +blood speckled stools +nausea+ hematemesis +abdominal pain  GU : Denies urinary burning, blood in urine, urinary frequency, and urinary incontinence. MS: Denies joint pain, limitation of movement, swelling, cramps, and atrophy.  Derm: Denies rash, itching, dry skin, hives. Psych: Denies depression, anxiety, memory loss, hallucinations, and confusion. Heme: Denies bruising or bleeding Neuro:  Denies any headaches, dizziness, paresthesias, shaking  Physical Exam   Vital Signs in last 24 hours: Temp:  [97.6 F (36.4 C)] 97.6 F (36.4 C) (05/07 0742) Pulse Rate:  [79-115] 84 (05/07 1015) Resp:  [14-19] 16 (05/07 1015) BP: (93-108)/(62-73) 93/73 (05/07 1015) SpO2:  [98 %-100 %] 100 % (05/07 1015) Weight:  [78.9 kg] 78.9 kg (05/07 0730)   General:   Alert,  Well-developed, well-nourished, pleasant and cooperative in NAD Head:  Normocephalic and atraumatic. Eyes:  Sclera clear, no icterus.   Conjunctiva pink. Ears:  Normal auditory acuity. Mouth:  No deformity or lesions, dentition normal. Neck:  Supple; no masses Lungs:  Clear throughout to auscultation.   No wheezes, crackles, or rhonchi. No acute distress. Heart:  Regular rate and rhythm; no murmurs, clicks, rubs,  or gallops. Abdomen:  Soft, and nondistended. TTP of diffuse abdomen. No masses, hepatosplenomegaly or hernias noted. Normal bowel sounds, without guarding, and without rebound.   Neurologic:  Alert and  oriented x4. Skin:  Intact without significant lesions or rashes. Psych:  Alert and cooperative. Normal mood and affect.  Labs/Studies   Recent Labs Recent Labs    01/17/24 0807  WBC 5.7  HGB 12.0  HCT 37.1  PLT 253   BMET Recent Labs    01/17/24 0807  NA 138  K 3.6  CL 107  CO2 22  GLUCOSE 107*  BUN 20  CREATININE 0.83  CALCIUM  8.9   LFT Recent Labs    01/17/24 0807  PROT 7.1  ALBUMIN 4.0  AST 23  ALT 10  ALKPHOS 102  BILITOT 0.7   PT/INR No  results for input(s): "LABPROT", "INR" in the last 72 hours. Hepatitis Panel No results for input(s): "HEPBSAG", "HCVAB", "HEPAIGM", "HEPBIGM" in the last 72 hours. C-Diff No results for input(s): "CDIFFTOX" in the last 72 hours.  Radiology/Studies CT ANGIO GI BLEED Result Date: 01/17/2024 CLINICAL DATA:  abdominal pain, hematemesis EXAM: CTA ABDOMEN AND PELVIS WITHOUT AND WITH CONTRAST TECHNIQUE: Multidetector CT imaging of the abdomen and pelvis was performed using the standard protocol during bolus administration of intravenous contrast. Multiplanar reconstructed images and MIPs were obtained and reviewed to evaluate the vascular anatomy. RADIATION DOSE REDUCTION: This exam was performed according to the departmental dose-optimization program which includes automated exposure control, adjustment of the mA and/or kV according to patient size and/or use of iterative reconstruction technique. CONTRAST:  OMNIPAQUE  IOHEXOL  350 MG/ML SOLN COMPARISON:  CT scan angiography abdomen and pelvis from 12/19/2023. FINDINGS: VASCULAR Aorta: Normal caliber aorta without aneurysm, dissection, vasculitis or significant stenosis. Celiac: Patent without evidence of aneurysm, dissection, vasculitis or significant stenosis. SMA: Patent without evidence of aneurysm, dissection, vasculitis or significant stenosis. 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 All renal arteries are patent without evidence of aneurysm, dissection, vasculitis, fibromuscular dysplasia or significant stenosis. IMA: Patent without evidence of aneurysm, dissection, vasculitis or significant stenosis. Inflow: Patent without evidence of aneurysm, dissection, vasculitis or significant stenosis. Proximal Outflow: Bilateral common femoral and visualized portions of the superficial and profunda femoral arteries are patent without evidence of aneurysm, dissection, vasculitis or significant stenosis. Veins: No obvious venous  abnormality within the limitations of this arterial phase study. Review of the MIP images confirms the above findings. NON-VASCULAR Lower chest: There are subpleural atelectatic changes in the visualized lung bases. No overt consolidation. No pleural effusion. The heart is normal in size. No pericardial effusion. Hepatobiliary: The liver is normal in size. Non-cirrhotic configuration. No suspicious mass. There are multiple scattered simple cysts throughout the liver with largest multi lobed cyst in the subcapsular left hepatic lobe, segment 3 measuring 3.5 x 6.2 cm. No intrahepatic or extrahepatic bile duct dilation. Gallbladder is surgically absent. Pancreas: Unremarkable. No pancreatic ductal dilatation or surrounding inflammatory changes. Spleen: Within normal limits. No focal lesion. Adrenals/Urinary Tract: Adrenal glands are unremarkable. No suspicious renal mass. No hydronephrosis. No renal or ureteric calculi. Unremarkable urinary bladder. Stomach/Bowel: There is hyperattenuating focus in fundus of the stomach however, remains stable in size on the postcontrast images and may represent ingested food material. Correlate clinically. No disproportionate dilation of the small or large bowel loops. No evidence of abnormal bowel wall thickening or inflammatory changes. The appendix was not visualized; however there is no acute inflammatory process in the right lower quadrant. No active extravasation  of contrast noted to suggest active GI bleeding. Vascular/Lymphatic: No ascites or pneumoperitoneum. No abdominal or pelvic lymphadenopathy, by size criteria. No aneurysmal dilation of the major abdominal arteries. Reproductive: The uterus is surgically absent. No large adnexal mass. Other: There is a tiny fat containing umbilical hernia. The soft tissues and abdominal wall are otherwise unremarkable. Musculoskeletal: No suspicious osseous lesions. There are mild multilevel degenerative changes in the visualized spine.  IMPRESSION: 1. No active GI bleeding noted. 2. Multiple other nonacute observations, as described above. Electronically Signed   By: Beula Brunswick M.D.   On: 01/17/2024 10:08    Assessment   Sabrina [name] is a 58 y.o. year old female with history of arthritis, bullous pemphigus, osteomyelitis, anxiety, hepatic cyst, hematemesis in the setting of esophagitis, gastric and duodenal angioectasias s/p APC with normal colonoscopy (other than hemorrhoids) and normal capsule endoscopy in April 2025, now returning to the ER today with chief complaint of recurrent hematemesis, GI consulted for further evaluation  Recurrent hematemesis/rectal bleeding:  multiple endoscopic evaluations since February to include negative capsule endoscopy and colonoscopy with hemorrhoids EGD x3 with gastric/duodenal angiectasias, gastritis, gastric ulcers. Reassuringly hgb is stable at 12. FOBT was positive. Some specks of blood noted in stools per patient. CT Angio GI bleed scan without overt bleeding source. Could be bleeding from any of the above sources. Would recommend trending h&h, monitoring for overt GI bleeding,  pursue repeat EGD tomorrow for further evaluation.   Diarrhea: onset last night with some specks of BRB present. Unclear etiology. Recent Colonoscopy in April as outlined above with hemorrhoids.  CTA without obvious findings of colitis or diverticular bleeding/diverticulitis. Recommend checking stool studies given diarrhea. Doubt rapid transit upper GI bleeding given hemodynamic stability but planning for EGD tomorrow.    Plan / Recommendations   PPI BID Carafate  1g QID  Trend h&h Monitor for overt GI bleeding Repeat upper endoscopy tomorrow  May need to consider octreotide therapy if bleeding thought to be secondary to angiectasias  7.  Stool studies due to diarrhea  8. Remain NPO    01/17/2024, 11:35 AM  Jiovanni Heeter L. Kassadee Carawan, MSN, APRN, AGNP-C Adult-Gerontology Nurse Practitioner [facility]  Gastroenterology at [facility]

## 2024-01-17 NOTE — ED Notes (Signed)
 Pt unable to provide urine sample at this time

## 2024-01-17 NOTE — Plan of Care (Signed)
 Continues with c/o severe abd pain and nausea despite meds administered. Currently sleeping after most recent med admin. Remains NPO x sips with meds for planned EGD tomorrow. Problem: Education: Goal: Knowledge of General Education information will improve Description: Including pain rating scale, medication(s)/side effects and non-pharmacologic comfort measures 01/17/2024 1812 by Oral Billings, RN Outcome: Progressing 01/17/2024 1812 by Oral Billings, RN Outcome: Progressing   Problem: Health Behavior/Discharge Planning: Goal: Ability to manage health-related needs will improve 01/17/2024 1812 by Oral Billings, RN Outcome: Progressing 01/17/2024 1812 by Oral Billings, RN Outcome: Progressing   Problem: Clinical Measurements: Goal: Ability to maintain clinical measurements within normal limits will improve 01/17/2024 1812 by Oral Billings, RN Outcome: Progressing 01/17/2024 1812 by Oral Billings, RN Outcome: Progressing Goal: Will remain free from infection 01/17/2024 1812 by Oral Billings, RN Outcome: Progressing 01/17/2024 1812 by Oral Billings, RN Outcome: Progressing Goal: Diagnostic test results will improve 01/17/2024 1812 by Oral Billings, RN Outcome: Progressing 01/17/2024 1812 by Oral Billings, RN Outcome: Progressing Goal: Respiratory complications will improve Outcome: Progressing Goal: Cardiovascular complication will be avoided Outcome: Progressing   Problem: Activity: Goal: Risk for activity intolerance will decrease Outcome: Progressing   Problem: Nutrition: Goal: Adequate nutrition will be maintained Outcome: Progressing   Problem: Coping: Goal: Level of anxiety will decrease Outcome: Progressing   Problem: Elimination: Goal: Will not experience complications related to bowel motility Outcome: Progressing Goal: Will not experience complications related to urinary retention Outcome: Progressing   Problem:  Pain Managment: Goal: General experience of comfort will improve and/or be controlled Outcome: Progressing   Problem: Safety: Goal: Ability to remain free from injury will improve Outcome: Progressing   Problem: Skin Integrity: Goal: Risk for impaired skin integrity will decrease Outcome: Progressing

## 2024-01-17 NOTE — Hospital Course (Signed)
 58 year old female with long history of chronic abdominal pain, opioid dependence, opioid seeking behaviors, history of gastric ulceration and hematemesis and hematochezia recently discharged from this facility on 12/26/2023 after being evaluated for upper GI bleeding receiving EGD status post treatment with APC and clips by Dr. Alita Irwin.  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  She had a colonoscopy as well showing internal hemorrhoids but no active bleeding.  She reports that she had been doing fairly well at home and following dietary recommendations and medications when last night she started having severe abdominal pain hematemesis and also had 2 bowel movements with bright red blood.  She says that the pain is worse now than it was 1 month ago.  She also is complaining of nausea and vomiting.  She was evaluated in the ED with a CT angio GI bleeding study with no evidence of active GI bleeding found.  Her hemoglobin was noted to be 12.0.  She was tachycardic on arrival which improved after IV fluid hydration.  She was also given nausea and pain medications and GI was consulted and recommended that she remain n.p.o. and be admitted for possible further endoscopy and treatments as needed.

## 2024-01-17 NOTE — Progress Notes (Signed)
 Pt arrived to room 336 via stretcher from ED. Pt able to ambulate from stretcher to bed without assistance. Oriented to room and safety procedures, states understanding. Call bell within reach, bed alarm on for safety. Advised to call for needs, states understanding.

## 2024-01-17 NOTE — ED Notes (Signed)
 Contacted pharmacy for status on promethazine  delivery. Pharm stated medication is on the way

## 2024-01-17 NOTE — ED Notes (Signed)
 Requested pain medication from provider for significant pain.

## 2024-01-17 NOTE — ED Triage Notes (Signed)
 Pt bib pov w/ c/o of abd pain, hematemesis, and small amounts of blood with diarrhea. Pt reports she has had this happen in the past. She reports she has had several endoscopy including craterization of blood vessels and endoscopic clips. Pt reports she has been dizzy this morning causing a call and bruising to the left thigh.

## 2024-01-17 NOTE — ED Notes (Signed)
 ED TO INPATIENT HANDOFF REPORT  ED Nurse Name and Phone #: [phone]  S Name/Age/Gender Sabrina [name] 58 y.o. female Room/Bed: APA09/APA09  Code Status   Code Status: Prior  Home/SNF/Other Home Patient oriented to: self, place, time, and situation Is this baseline? Yes   Triage Complete: Triage complete  Chief Complaint Upper GI hemorrhage [K92.2]  Triage Note Pt bib pov w/ c/o of abd pain, hematemesis, and small amounts of blood with diarrhea. Pt reports she has had this happen in the past. She reports she has had several endoscopy including craterization of blood vessels and endoscopic clips. Pt reports she has been dizzy this morning causing a call and bruising to the left thigh.     Allergies Allergies  Allergen Reactions   Droperidol Palpitations and Other (See Comments)    Elevates BP increases HR   Ondansetron  Hives, Swelling and Rash    Spots around IV site IV Zofran    Benzonatate  Rash and Other (See Comments)    Caused burning pain in eyes  Watery eyes  Vision changes  Conjunctivitis   Bromfed Dm [Pseudoeph-Bromphen-Dm] Anxiety   Diclofenac  Sodium Rash   Flexeril [Cyclobenzaprine] Anxiety    Level of Care/Admitting Diagnosis ED Disposition     ED Disposition  Admit   Condition  --   Comment  Hospital Area: [facility] [[id number]]  Level of Care: Telemetry [5]  Covid Evaluation: Asymptomatic - no recent exposure (last 10 days) testing not required  Diagnosis: Upper GI hemorrhage [478295]  Admitting Physician: Rayfield Cairo [[id number]]  Attending Physician: Rayfield Cairo [[id number]]  Certification:: I certify this patient will need inpatient services for at least 2 midnights  Expected Medical Readiness: 01/19/2024          B Medical/Surgery History Past Medical History:  Diagnosis Date   Arthritis    "knees" (09/06/2017)   Bullous pemphigus    Cat bite 06/2014   to left elbow   History of blood transfusion 1988   "when I had my  baby"   Muscle weakness of lower extremity 2001; 09/05/2017   "resolved after a couple weeks; ?" (09/06/2017)   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HCC)    Poisoning, snake bite 04/08/2016   "[place]; RUE"   PONV (postoperative nausea and vomiting)    S/P right knee surgery    Situational anxiety    Staph infection ~ 2015   "left elbow and finger"   Thyroid  mass    Past Surgical History:  Procedure Laterality Date   APPENDECTOMY  ~ 1987   APPLICATION OF A-CELL OF EXTREMITY Left 08/05/2015   Procedure: APPLICATION OF A-CELL OF EXTREMITY;  Surgeon: Thornell Flirt, DO;  Location: [facility];  Service: Plastics;  Laterality: Left;   BIOPSY  10/26/2023   Procedure: BIOPSY;  Surgeon: Hargis Lias, MD;  [facility];  Service: Endoscopy;;   BREAST SURGERY Right 1990   "milk duct taken out"   CHONDROPLASTY Right 02/19/2019   Procedure: CHONDROPLASTY; EXCISION EXOSTOSIS;  Surgeon: Shirlee Dotter, MD;  Location: [facility];  Service: Orthopedics;  Laterality: Right;   COLONOSCOPY N/A 12/22/2023   Procedure: COLONOSCOPY;  Surgeon: Hargis Lias, MD;  [facility];  Service: Endoscopy;  Laterality: N/A;   DEBRIDEMENT AND CLOSURE WOUND Left 07/01/2015   Procedure: LEFT ELBOW EXCISION OF WOUND WITH PRIMARY CLOSURE 2X5 CM ;  Surgeon: Thornell Flirt, DO;  Location: [facility];  Service: Plastics;  Laterality:  Left;   ELBOW SURGERY Left X 23 in [place]  <06/2015   from a cat bite; all I&D   ESOPHAGOGASTRODUODENOSCOPY N/A 12/14/2023   Procedure: EGD (ESOPHAGOGASTRODUODENOSCOPY);  Surgeon: Hargis Lias, MD;  [facility];  Service: Endoscopy;  Laterality: N/A;   ESOPHAGOGASTRODUODENOSCOPY N/A 12/19/2023   Procedure: EGD (ESOPHAGOGASTRODUODENOSCOPY);  Surgeon: Umberto Ganong, Bearl Limes, MD;  [facility];  Service: Gastroenterology;  Laterality: N/A;   ESOPHAGOGASTRODUODENOSCOPY (EGD) WITH PROPOFOL  N/A  10/26/2023   Procedure: ESOPHAGOGASTRODUODENOSCOPY (EGD) WITH PROPOFOL ;  Surgeon: Hargis Lias, MD;  [facility];  Service: Endoscopy;  Laterality: N/A;   GIVENS CAPSULE STUDY N/A 12/22/2023   Procedure: IMAGING PROCEDURE, GI TRACT, INTRALUMINAL, VIA CAPSULE;  Surgeon: Hargis Lias, MD;  [facility];  Service: Endoscopy;  Laterality: N/A;   HOT HEMOSTASIS  12/14/2023   Procedure: EGD, WITH ARGON PLASMA COAGULATION;  Surgeon: Hargis Lias, MD;  [facility];  Service: Endoscopy;;   I & D EXTREMITY Left 07/08/2015   Procedure: IRRIGATION AND DEBRIDEMENT EXTREMITY, DRAINAGE OF LEFT ARM WOUND, A-CELL PLACEMENT, WOUND VAC PLACEMENT;  Surgeon: Lindaann Requena Dillingham, DO;  Location: [facility];  Service: Plastics;  Laterality: Left;   INCISION AND DRAINAGE OF WOUND Left 08/05/2015   Procedure: IRRIGATION AND DEBRIDEMENT LEFT ELBOW WOUND, PLACEMENT OF ACELL;  Surgeon: Thornell Flirt, DO;  Location: [facility];  Service: Plastics;  Laterality: Left;   KNEE ARTHROSCOPY WITH MEDIAL MENISECTOMY Right 02/19/2019   Procedure: RIGHT KNEE ARTHROSCOPY, DEBRIDEMENT, PARTIAL MEDIAL AND LATERAL MENISECTOMY;  Surgeon: Shirlee Dotter, MD;  Location: [facility];  Service: Orthopedics;  Laterality: Right;   LAPAROSCOPIC CHOLECYSTECTOMY  1998   SKIN GRAFT Left 2016   took from anterior thigh; placed at elbow   TONSILLECTOMY  ~ 2000   TOTAL ABDOMINAL HYSTERECTOMY  2003   WRIST SURGERY Right 01/2016     A IV Location/Drains/Wounds Patient Lines/Drains/Airways Status     Active Line/Drains/Airways     Name Placement date Placement time Site Days   Peripheral IV 01/17/24 22 G 2.5" Anterior;Distal;Left;Upper Arm 01/17/24  1040  Arm  less than 1            Intake/Output Last 24 hours  Intake/Output Summary (Last 24 hours) at 01/17/2024 1054 Last data filed at 01/17/2024 1012 Gross per 24 hour  Intake 1000 ml  Output --  Net 1000 ml     Labs/Imaging Results for orders placed or performed during the hospital encounter of 01/17/24 (from the past 48 hours)  Type and screen [facility]     Status: None   Collection Time: 01/17/24  8:05 AM  Result Value Ref Range   ABO/RH(D) A POS    Antibody Screen NEG    Sample Expiration      01/20/2024,2359 Performed at [facility], [address] [place], [place] [address]   Comprehensive metabolic panel     Status: Abnormal   Collection Time: 01/17/24  8:07 AM  Result Value Ref Range   Sodium 138 135 - 145 mmol/L   Potassium 3.6 3.5 - 5.1 mmol/L    Comment: HEMOLYSIS AT THIS LEVEL MAY AFFECT RESULT   Chloride 107 98 - 111 mmol/L   CO2 22 22 - 32 mmol/L   Glucose, Bld 107 (H) 70 - 99 mg/dL    Comment: Glucose reference range applies only to samples taken after fasting for at least 8 hours.   BUN 20 6 -  20 mg/dL   Creatinine, Ser 4.01 0.44 - 1.00 mg/dL   Calcium  8.9 8.9 - 10.3 mg/dL   Total Protein 7.1 6.5 - 8.1 g/dL   Albumin 4.0 3.5 - 5.0 g/dL   AST 23 15 - 41 U/L    Comment: HEMOLYSIS AT THIS LEVEL MAY AFFECT RESULT   ALT 10 0 - 44 U/L    Comment: HEMOLYSIS AT THIS LEVEL MAY AFFECT RESULT   Alkaline Phosphatase 102 38 - 126 U/L   Total Bilirubin 0.7 0.0 - 1.2 mg/dL    Comment: HEMOLYSIS AT THIS LEVEL MAY AFFECT RESULT   GFR, Estimated >60 >60 mL/min    Comment: (NOTE) Calculated using the CKD-EPI Creatinine Equation (2021)    Anion gap 9 5 - 15    Comment: Performed at [facility], [address], [place], [place] [address]  CBC with Differential     Status: None   Collection Time: 01/17/24  8:07 AM  Result Value Ref Range   WBC 5.7 4.0 - 10.5 K/uL   RBC 3.88 3.87 - 5.11 MIL/uL   Hemoglobin 12.0 12.0 - 15.0 g/dL   HCT 36.6 44.0 - 34.7 %   MCV 95.6 80.0 - 100.0 fL   MCH 30.9 26.0 - 34.0 pg   MCHC 32.3 30.0 - 36.0 g/dL   RDW 42.5 95.6 - 38.7 %   Platelets 253 150 - 400 K/uL   nRBC 0.0 0.0 - 0.2 %   Neutrophils Relative % 62 %   Neutro Abs 3.5 1.7 -  7.7 K/uL   Lymphocytes Relative 24 %   Lymphs Abs 1.4 0.7 - 4.0 K/uL   Monocytes Relative 11 %   Monocytes Absolute 0.6 0.1 - 1.0 K/uL   Eosinophils Relative 3 %   Eosinophils Absolute 0.2 0.0 - 0.5 K/uL   Basophils Relative 0 %   Basophils Absolute 0.0 0.0 - 0.1 K/uL   Immature Granulocytes 0 %   Abs Immature Granulocytes 0.02 0.00 - 0.07 K/uL    Comment: Performed at [facility], [address] [place], [place] [address]  Lipase, blood     Status: None   Collection Time: 01/17/24  8:07 AM  Result Value Ref Range   Lipase 34 11 - 51 U/L    Comment: Performed at [facility], [address] [place], [place] [address]  POC occult blood, ED     Status: Abnormal   Collection Time: 01/17/24  9:19 AM  Result Value Ref Range   Fecal Occult Blood     CT ANGIO GI BLEED Result Date: 01/17/2024 CLINICAL DATA:  abdominal pain, hematemesis EXAM: CTA ABDOMEN AND PELVIS WITHOUT AND WITH CONTRAST TECHNIQUE: Multidetector CT imaging of the abdomen and pelvis was performed using the standard protocol during bolus administration of intravenous contrast. Multiplanar reconstructed images and MIPs were obtained and reviewed to evaluate the vascular anatomy. RADIATION DOSE REDUCTION: This exam was performed according to the departmental dose-optimization program which includes automated exposure control, adjustment of the mA and/or kV according to patient size and/or use of iterative reconstruction technique. CONTRAST:  OMNIPAQUE  IOHEXOL  350 MG/ML SOLN COMPARISON:  CT scan angiography abdomen and pelvis from 12/19/2023. FINDINGS: VASCULAR Aorta: Normal caliber aorta without aneurysm, dissection, vasculitis or significant stenosis. Celiac: Patent without evidence of aneurysm, dissection, vasculitis or significant stenosis. SMA: Patent without evidence of aneurysm, dissection, vasculitis or significant stenosis. 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 All renal arteries are patent  without evidence of aneurysm, dissection,  vasculitis, fibromuscular dysplasia or significant stenosis. IMA: Patent without evidence of aneurysm, dissection, vasculitis or significant stenosis. Inflow: Patent without evidence of aneurysm, dissection, vasculitis or significant stenosis. Proximal Outflow: Bilateral common femoral and visualized portions of the superficial and profunda femoral arteries are patent without evidence of aneurysm, dissection, vasculitis or significant stenosis. Veins: No obvious venous abnormality within the limitations of this arterial phase study. Review of the MIP images confirms the above findings. NON-VASCULAR Lower chest: There are subpleural atelectatic changes in the visualized lung bases. No overt consolidation. No pleural effusion. The heart is normal in size. No pericardial effusion. Hepatobiliary: The liver is normal in size. Non-cirrhotic configuration. No suspicious mass. There are multiple scattered simple cysts throughout the liver with largest multi lobed cyst in the subcapsular left hepatic lobe, segment 3 measuring 3.5 x 6.2 cm. No intrahepatic or extrahepatic bile duct dilation. Gallbladder is surgically absent. Pancreas: Unremarkable. No pancreatic ductal dilatation or surrounding inflammatory changes. Spleen: Within normal limits. No focal lesion. Adrenals/Urinary Tract: Adrenal glands are unremarkable. No suspicious renal mass. No hydronephrosis. No renal or ureteric calculi. Unremarkable urinary bladder. Stomach/Bowel: There is hyperattenuating focus in fundus of the stomach however, remains stable in size on the postcontrast images and may represent ingested food material. Correlate clinically. No disproportionate dilation of the small or large bowel loops. No evidence of abnormal bowel wall thickening or inflammatory changes. The appendix was not visualized; however there is no acute inflammatory process in the right lower quadrant. No active extravasation of  contrast noted to suggest active GI bleeding. Vascular/Lymphatic: No ascites or pneumoperitoneum. No abdominal or pelvic lymphadenopathy, by size criteria. No aneurysmal dilation of the major abdominal arteries. Reproductive: The uterus is surgically absent. No large adnexal mass. Other: There is a tiny fat containing umbilical hernia. The soft tissues and abdominal wall are otherwise unremarkable. Musculoskeletal: No suspicious osseous lesions. There are mild multilevel degenerative changes in the visualized spine. IMPRESSION: 1. No active GI bleeding noted. 2. Multiple other nonacute observations, as described above. Electronically Signed   By: Beula Brunswick M.D.   On: 01/17/2024 10:08    Pending Labs Unresulted Labs (From admission, onward)     Start     Ordered   01/17/24 0818  Urinalysis, Routine w reflex microscopic -Urine, Clean Catch  Once,   URGENT       Question:  Specimen Source  Answer:  Urine, Clean Catch   01/17/24 0818            Vitals/Pain Today's Vitals   01/17/24 0918 01/17/24 0930 01/17/24 1011 01/17/24 1015  BP:  98/73  93/73  Pulse:  79  84  Resp:  14  16  Temp:      TempSrc:      SpO2:  98%  100%  Weight:      Height:      PainSc: 8   7      Isolation Precautions No active isolations  Medications Medications  pantoprazole  (PROTONIX ) injection 40 mg (40 mg Intravenous Given 01/17/24 0846)    Followed by  pantoprazole  (PROTONIX ) injection 40 mg (has no administration in time range)  sodium chloride  0.9 % bolus 1,000 mL (0 mLs Intravenous Stopped 01/17/24 1012)  HYDROmorphone  (DILAUDID ) injection 0.5 mg (0.5 mg Intravenous Given 01/17/24 0836)  promethazine  (PHENERGAN ) 12.5 mg in sodium chloride  0.9 % 50 mL IVPB (0 mg Intravenous Paused 01/17/24 1041)  HYDROmorphone  (DILAUDID ) injection 1 mg (1 mg Intravenous Given 01/17/24 0918)  iohexol  (OMNIPAQUE ) 350 MG/ML  injection 100 mL (100 mLs Intravenous Contrast Given 01/17/24 0946)    Mobility walks     Focused  Assessments    R Recommendations: See Admitting Provider Note  Report given to:   Additional Notes:

## 2024-01-17 NOTE — ED Provider Notes (Signed)
 [place] EMERGENCY DEPARTMENT AT [facility] Provider Note   CSN: [id number] Arrival date & time: 01/17/24  6387     History  Chief Complaint  Patient presents with   Abdominal Pain   Hematemesis    Sabrina [name] is a 58 y.o. female.  58 year old female with a history of chronic abdominal pain and gastric ulcers who presents emergency department with abdominal pain, hematemesis, and hematochezia.  Patient reports that last night midnight she started vomiting.  Has had some blood clots in her emesis.  Also reports some epigastric abdominal pain that is typical for her when she has GI bleeding and bouts of vomiting.  Also has noticed some specks of red blood in her stool recently.  Not on blood thinners.  Had similar presentation in April and had 2 EGDs with angiectasia that was treated with APC and clips that were placed.  Also had a colonoscopy that showed internal hemorrhoids but no bleeding.  No heavy alcohol use, NSAIDs, or liver disease.       Home Medications Prior to Admission medications   Medication Sig Start Date End Date Taking? Authorizing Provider  acetaminophen  (TYLENOL ) 325 MG tablet Take 325-650 mg by mouth every 6 (six) hours as needed for mild pain (pain score 1-3).    [provider]  butalbital -acetaminophen -caffeine  (FIORICET ) 50-325-40 MG tablet Take 1 tablet by mouth daily as needed for headache or migraine. 11/19/23   [provider]  diazepam  (VALIUM ) 10 MG tablet Take 1 tablet (10 mg total) by mouth 3 (three) times daily as needed for anxiety. 01/09/24   Plovsky, Doroteo Gasmen, MD  dicyclomine  (BENTYL ) 10 MG capsule Take 1 capsule (10 mg total) by mouth 3 (three) times daily as needed for spasms. 12/26/23   Evander Hills, PA-C  DULoxetine  (CYMBALTA ) 60 MG capsule Take 1 capsule (60 mg total) by mouth daily. 12/27/23   Arrien, Curlee Doss, MD  FERREX 150 150 MG capsule Take 150 mg by mouth daily.    [provider]  gabapentin   (NEURONTIN ) 300 MG capsule Take 2 capsules (600 mg total) by mouth 3 (three) times daily. 08/10/21   Zilphia Hilt, Charyl Coppersmith, MD  pantoprazole  (PROTONIX ) 40 MG tablet Take 1 tablet (40 mg total) by mouth 2 (two) times daily. 12/19/23 02/17/24  Shahmehdi, Seyed A, MD  phentermine (ADIPEX-P) 37.5 MG tablet Take 37.5 mg by mouth 3 (three) times a week. 12/07/23   [provider]  promethazine  (PHENERGAN ) 12.5 MG tablet Take 1 tablet (12.5 mg total) by mouth every 6 (six) hours as needed for nausea or vomiting. 12/26/23   Arrien, Curlee Doss, MD  rosuvastatin  (CRESTOR ) 20 MG tablet Take 1 tablet by mouth daily. 06/08/22   [provider]  sucralfate  (CARAFATE ) 1 g tablet Take 1 tablet (1 g total) by mouth 4 (four) times daily. 12/19/23 01/18/24  Bobbetta Burnet, MD  traMADol  (ULTRAM ) 50 MG tablet Take 1 tablet (50 mg total) by mouth every 6 (six) hours as needed for severe pain (pain score 7-10). 12/26/23   Arrien, Mauricio Daniel, MD  traZODone  (DESYREL ) 100 MG tablet Take 1 tablet (100 mg total) by mouth at bedtime as needed for sleep. 12/26/23 01/25/24  Arrien, Curlee Doss, MD      Allergies    Droperidol, Ondansetron , Benzonatate , Bromfed dm [pseudoeph-bromphen-dm], Diclofenac  sodium, and Flexeril [cyclobenzaprine]    Review of Systems   Review of Systems  Physical Exam Updated Vital Signs BP 93/73   Pulse 84  Temp 97.6 F (36.4 C) (Oral)   Resp 16   Ht 5\' 4"  (1.626 m)   Wt 78.9 kg   SpO2 100%   BMI 29.87 kg/m  Physical Exam Vitals and nursing note reviewed.  Constitutional:      General: She is not in acute distress.    Appearance: She is well-developed.  HENT:     Head: Normocephalic and atraumatic.     Right Ear: External ear normal.     Left Ear: External ear normal.     Nose: Nose normal.  Eyes:     Extraocular Movements: Extraocular movements intact.     Conjunctiva/sclera: Conjunctivae normal.     Pupils: Pupils are equal, round, and reactive to  light.  Abdominal:     General: Abdomen is flat. There is no distension.     Palpations: Abdomen is soft. There is no mass.     Tenderness: There is abdominal tenderness (Epigastrium). There is no guarding.  Genitourinary:    Comments: Chaperoned by patient's RN.  Nonbleeding external hemorrhoids or fissures noted on external inspection. No internal masses or hemorroids noted. Normal rectal tone. Brown stool in rectal vault. No melena or gross blood.  Hemoccult positive.  Musculoskeletal:     Cervical back: Normal range of motion and neck supple.     Right lower leg: No edema.     Left lower leg: No edema.  Skin:    General: Skin is warm and dry.  Neurological:     Mental Status: She is alert and oriented to person, place, and time. Mental status is at baseline.  Psychiatric:        Mood and Affect: Mood normal.     ED Results / Procedures / Treatments   Labs (all labs ordered are listed, but only abnormal results are displayed) Labs Reviewed  COMPREHENSIVE METABOLIC PANEL WITH GFR - Abnormal; Notable for the following components:      Result Value   Glucose, Bld 107 (*)    All other components within normal limits  POC OCCULT BLOOD, ED - Abnormal  CBC WITH DIFFERENTIAL/PLATELET  LIPASE, BLOOD  URINALYSIS, ROUTINE W REFLEX MICROSCOPIC  RAPID URINE DRUG SCREEN, HOSP PERFORMED  TYPE AND SCREEN    EKG EKG Interpretation Date/Time:  Wednesday Jan 17 2024 08:27:50 EDT Ventricular Rate:  103 PR Interval:  143 QRS Duration:  113 QT Interval:  351 QTC Calculation: 460 R Axis:   67  Text Interpretation: Sinus tachycardia Borderline intraventricular conduction delay Borderline T wave abnormalities Artifact in lead(s) I III aVR aVL aVF Confirmed by Shyrl Doyne [phone]) on 01/17/2024 10:32:31 AM  Radiology CT ANGIO GI BLEED Result Date: 01/17/2024 CLINICAL DATA:  abdominal pain, hematemesis EXAM: CTA ABDOMEN AND PELVIS WITHOUT AND WITH CONTRAST TECHNIQUE: Multidetector CT  imaging of the abdomen and pelvis was performed using the standard protocol during bolus administration of intravenous contrast. Multiplanar reconstructed images and MIPs were obtained and reviewed to evaluate the vascular anatomy. RADIATION DOSE REDUCTION: This exam was performed according to the departmental dose-optimization program which includes automated exposure control, adjustment of the mA and/or kV according to patient size and/or use of iterative reconstruction technique. CONTRAST:  OMNIPAQUE  IOHEXOL  350 MG/ML SOLN COMPARISON:  CT scan angiography abdomen and pelvis from 12/19/2023. FINDINGS: VASCULAR Aorta: Normal caliber aorta without aneurysm, dissection, vasculitis or significant stenosis. Celiac: Patent without evidence of aneurysm, dissection, vasculitis or significant stenosis. SMA: Patent without evidence of aneurysm, dissection, vasculitis or significant stenosis. 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 All renal arteries are patent without evidence of aneurysm, dissection, vasculitis, fibromuscular dysplasia or significant stenosis. IMA: Patent without evidence of aneurysm, dissection, vasculitis or significant stenosis. Inflow: Patent without evidence of aneurysm, dissection, vasculitis or significant stenosis. Proximal Outflow: Bilateral common femoral and visualized portions of the superficial and profunda femoral arteries are patent without evidence of aneurysm, dissection, vasculitis or significant stenosis. Veins: No obvious venous abnormality within the limitations of this arterial phase study. Review of the MIP images confirms the above findings. NON-VASCULAR Lower chest: There are subpleural atelectatic changes in the visualized lung bases. No overt consolidation. No pleural effusion. The heart is normal in size. No pericardial effusion. Hepatobiliary: The liver is normal in size. Non-cirrhotic configuration. No suspicious mass. There are multiple  scattered simple cysts throughout the liver with largest multi lobed cyst in the subcapsular left hepatic lobe, segment 3 measuring 3.5 x 6.2 cm. No intrahepatic or extrahepatic bile duct dilation. Gallbladder is surgically absent. Pancreas: Unremarkable. No pancreatic ductal dilatation or surrounding inflammatory changes. Spleen: Within normal limits. No focal lesion. Adrenals/Urinary Tract: Adrenal glands are unremarkable. No suspicious renal mass. No hydronephrosis. No renal or ureteric calculi. Unremarkable urinary bladder. Stomach/Bowel: There is hyperattenuating focus in fundus of the stomach however, remains stable in size on the postcontrast images and may represent ingested food material. Correlate clinically. No disproportionate dilation of the small or large bowel loops. No evidence of abnormal bowel wall thickening or inflammatory changes. The appendix was not visualized; however there is no acute inflammatory process in the right lower quadrant. No active extravasation of contrast noted to suggest active GI bleeding. Vascular/Lymphatic: No ascites or pneumoperitoneum. No abdominal or pelvic lymphadenopathy, by size criteria. No aneurysmal dilation of the major abdominal arteries. Reproductive: The uterus is surgically absent. No large adnexal mass. Other: There is a tiny fat containing umbilical hernia. The soft tissues and abdominal wall are otherwise unremarkable. Musculoskeletal: No suspicious osseous lesions. There are mild multilevel degenerative changes in the visualized spine. IMPRESSION: 1. No active GI bleeding noted. 2. Multiple other nonacute observations, as described above. Electronically Signed   By: Beula Brunswick M.D.   On: 01/17/2024 10:08    Procedures Procedures    Medications Ordered in ED Medications  pantoprazole  (PROTONIX ) injection 40 mg (40 mg Intravenous Given 01/17/24 0846)    Followed by  pantoprazole  (PROTONIX ) injection 40 mg (has no administration in time range)   lactated ringers  infusion (has no administration in time range)  acetaminophen  (TYLENOL ) tablet 650 mg (has no administration in time range)    Or  acetaminophen  (TYLENOL ) suppository 650 mg (has no administration in time range)  traMADol  (ULTRAM ) tablet 50 mg (has no administration in time range)  ketorolac  (TORADOL ) 15 MG/ML injection 15 mg (has no administration in time range)  traZODone  (DESYREL ) tablet 50 mg (has no administration in time range)  prochlorperazine  (COMPAZINE ) injection 10 mg (has no administration in time range)  sodium chloride  0.9 % bolus 1,000 mL (0 mLs Intravenous Stopped 01/17/24 1012)  HYDROmorphone  (DILAUDID ) injection 0.5 mg (0.5 mg Intravenous Given 01/17/24 0836)  promethazine  (PHENERGAN ) 12.5 mg in sodium chloride  0.9 % 50 mL IVPB (0 mg Intravenous Paused 01/17/24 1041)  HYDROmorphone  (DILAUDID ) injection 1 mg (1 mg Intravenous Given 01/17/24 0918)  iohexol  (OMNIPAQUE ) 350 MG/ML injection 100 mL (100 mLs Intravenous Contrast Given 01/17/24 0946)    ED Course/ Medical Decision Making/ A&P Clinical Course as of 01/17/24 1108  Wed Jan 17, 2024  1610 Dr Riley Cheadle from GI consulted.  They will see the patient when admitted. [RP]  1105 Dr. Lincoln Renshaw from hospitalist to admit the patient. [RP]    Clinical Course User Index [RP] Ninetta Basket, MD                                 Medical Decision Making Amount and/or Complexity of Data Reviewed Labs: ordered. Radiology: ordered.  Risk Prescription drug management. Decision regarding hospitalization.   MILLI DESMET is a 58 y.o. female with comorbidities that complicate the patient evaluation including chronic abdominal pain and gastric ulcers who presents emergency department with abdominal pain, hematemesis, and hematochezia.     Initial Ddx:  Upper GI bleed, peptic ulcer, lower GI bleed, diverticulosis, malignancy, AVM, hemorrhoids, variceal bleed, symptomatic anemia  MDM:  Concerned that the patient may have  a upper GI bleed based on their symptoms.  No apparent bleeding hemorrhoids on exam.  Does have Hemoccult positive stool but is not grossly bloody.  Will go ahead and start the patient on Protonix  and consult GI.  Did not appear to be having symptomatic anemia at this time.  Low concern for variceal bleed given the patient's history.  Plan:  Labs Type and screen Hemoccult Protonix  GI consult  ED Summary/Re-evaluation:  Patient having persistent abdominal pain.  Suspect this is chronic but did obtain a CT scan that did not show acute findings.  No active bleeding or extravasation.  Consulted GI who will see the patient when admitted.  Vital signs remained stable in the emergency department.  Hemoglobin of 12.  Admitted to hospitalist for further management.  This patient presents to the ED for concern of complaints listed in HPI, this involves an extensive number of treatment options, and is a complaint that carries with it a high risk of complications and morbidity. Disposition including potential need for admission considered.   Dispo: Admit to Floor  Records reviewed Outpatient Clinic Notes The following labs were independently interpreted: Chemistry and show no acute abnormality I independently reviewed the following imaging with scope of interpretation limited to determining acute life threatening conditions related to emergency care: CT Abdomen/Pelvis and agree with the radiologist interpretation with the following exceptions: none I personally reviewed and interpreted cardiac monitoring: normal sinus rhythm  I personally reviewed and interpreted the pt's EKG: see above for interpretation  I have reviewed the patients home medications and made adjustments as needed Consults: Gastroenterology and Hospitalist   Final Clinical Impression(s) / ED Diagnoses Final diagnoses:  Acute upper GI bleed    Rx / DC Orders ED Discharge Orders     None         Ninetta Basket,  MD 01/17/24 1108

## 2024-01-17 NOTE — ED Notes (Signed)
 Upon assessment of IV site (pump indicated occlusion) it was determined that the site was infiltrating. MD was notified, IV was removed, further assessed and pt was given a warm compress. Pharmacy advised to monitor site, elevate, and apply a warm dry compress.

## 2024-01-17 NOTE — ED Notes (Signed)
 Pt had emesis episode after receiving Tramadol .

## 2024-01-17 NOTE — H&P (Signed)
 History and Physical  [facility]  Sabrina [name] ZOX:[id number] DOB: [birth year] DOA: 01/17/2024  PCP: Michele Ahle, MD  Patient coming from: Home  Level of care: Telemetry  I have personally briefly reviewed patient's old medical records in [facility]  Chief Complaint: abdominal pain, hematemesis, hematochezia   HPI: Sabrina [name] is a 58 year old female with long history of chronic abdominal pain, opioid dependence, opioid seeking behaviors, history of gastric ulceration and hematemesis and hematochezia recently discharged from this facility on 12/26/2023 after being evaluated for upper GI bleeding receiving EGD status post treatment with APC and clips by Dr. Alita Irwin.  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  She had a colonoscopy as well showing internal hemorrhoids but no active bleeding.  She reports that she had been doing fairly well at home and following dietary recommendations and medications when last night she started having severe abdominal pain hematemesis and also had 2 bowel movements with bright red blood.  She says that the pain is worse now than it was 1 month ago.  She also is complaining of nausea and vomiting.  She was evaluated in the ED with a CT angio GI bleeding study with no evidence of active GI bleeding found.  Her hemoglobin was noted to be 12.0.  She was tachycardic on arrival which improved after IV fluid hydration.  She was also given nausea and pain medications and GI was consulted and recommended that she remain n.p.o. and be admitted for possible further endoscopy and treatments as needed.    Past Medical History:  Diagnosis Date   Arthritis    "knees" (09/06/2017)   Bullous pemphigus    Cat bite 06/2014   to left elbow   History of blood transfusion 1988   "when I had my baby"   Muscle weakness of lower extremity 2001; 09/05/2017   "resolved after a couple weeks; ?" (09/06/2017)    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HCC)    Poisoning, snake bite 04/08/2016   "[place]; RUE"   PONV (postoperative nausea and vomiting)    S/P right knee surgery    Situational anxiety    Staph infection ~ 2015   "left elbow and finger"   Thyroid  mass     Past Surgical History:  Procedure Laterality Date   APPENDECTOMY  ~ 1987   APPLICATION OF A-CELL OF EXTREMITY Left 08/05/2015   Procedure: APPLICATION OF A-CELL OF EXTREMITY;  Surgeon: Thornell Flirt, DO;  Location: [facility];  Service: Plastics;  Laterality: Left;   BIOPSY  10/26/2023   Procedure: BIOPSY;  Surgeon: Hargis Lias, MD;  [facility];  Service: Endoscopy;;   BREAST SURGERY Right 1990   "milk duct taken out"   CHONDROPLASTY Right 02/19/2019   Procedure: CHONDROPLASTY; EXCISION EXOSTOSIS;  Surgeon: Shirlee Dotter, MD;  Location: [facility];  Service: Orthopedics;  Laterality: Right;   COLONOSCOPY N/A 12/22/2023   Procedure: COLONOSCOPY;  Surgeon: Hargis Lias, MD;  [facility];  Service: Endoscopy;  Laterality: N/A;   DEBRIDEMENT AND CLOSURE WOUND Left 07/01/2015   Procedure: LEFT ELBOW EXCISION OF WOUND WITH PRIMARY CLOSURE 2X5 CM ;  Surgeon: Thornell Flirt, DO;  Location: [facility];  Service: Plastics;  Laterality: Left;   ELBOW SURGERY Left X 23 in [place]  <06/2015   from a cat bite; all I&D   ESOPHAGOGASTRODUODENOSCOPY N/A 12/14/2023   Procedure: EGD (ESOPHAGOGASTRODUODENOSCOPY);  Surgeon: Hargis Lias, MD;  [facility];  Service: Endoscopy;  Laterality: N/A;   ESOPHAGOGASTRODUODENOSCOPY N/A 12/19/2023   Procedure: EGD (ESOPHAGOGASTRODUODENOSCOPY);  Surgeon: Umberto Ganong, Bearl Limes, MD;  [facility];  Service: Gastroenterology;  Laterality: N/A;   ESOPHAGOGASTRODUODENOSCOPY (EGD) WITH PROPOFOL  N/A 10/26/2023   Procedure: ESOPHAGOGASTRODUODENOSCOPY (EGD) WITH PROPOFOL ;  Surgeon: Hargis Lias, MD;  [facility];  Service: Endoscopy;  Laterality: N/A;   GIVENS CAPSULE STUDY N/A 12/22/2023   Procedure: IMAGING PROCEDURE, GI TRACT, INTRALUMINAL, VIA CAPSULE;  Surgeon: Hargis Lias, MD;  [facility];  Service: Endoscopy;  Laterality: N/A;   HOT HEMOSTASIS  12/14/2023   Procedure: EGD, WITH ARGON PLASMA COAGULATION;  Surgeon: Hargis Lias, MD;  [facility];  Service: Endoscopy;;   I & D EXTREMITY Left 07/08/2015   Procedure: IRRIGATION AND DEBRIDEMENT EXTREMITY, DRAINAGE OF LEFT ARM WOUND, A-CELL PLACEMENT, WOUND VAC PLACEMENT;  Surgeon: Lindaann Requena Dillingham, DO;  Location: [facility];  Service: Plastics;  Laterality: Left;   INCISION AND DRAINAGE OF WOUND Left 08/05/2015   Procedure: IRRIGATION AND DEBRIDEMENT LEFT ELBOW WOUND, PLACEMENT OF ACELL;  Surgeon: Thornell Flirt, DO;  Location: [facility];  Service: Plastics;  Laterality: Left;   KNEE ARTHROSCOPY WITH MEDIAL MENISECTOMY Right 02/19/2019   Procedure: RIGHT KNEE ARTHROSCOPY, DEBRIDEMENT, PARTIAL MEDIAL AND LATERAL MENISECTOMY;  Surgeon: Shirlee Dotter, MD;  Location: [facility];  Service: Orthopedics;  Laterality: Right;   LAPAROSCOPIC CHOLECYSTECTOMY  1998   SKIN GRAFT Left 2016   took from anterior thigh; placed at elbow   TONSILLECTOMY  ~ 2000   TOTAL ABDOMINAL HYSTERECTOMY  2003   WRIST SURGERY Right 01/2016     reports that she has never smoked. She has never used smokeless tobacco. She reports that she does not currently use alcohol. She reports that she does not use drugs.  Allergies  Allergen Reactions   Droperidol Palpitations and Other (See Comments)    Elevates BP increases HR   Ondansetron  Hives, Swelling and Rash    Spots around IV site IV Zofran    Benzonatate  Rash and Other (See Comments)    Caused burning pain in eyes  Watery eyes  Vision changes  Conjunctivitis   Bromfed Dm [Pseudoeph-Bromphen-Dm] Anxiety   Diclofenac  Sodium Rash   Flexeril  [Cyclobenzaprine] Anxiety    Family History  Problem Relation Age of Onset   Liver disease Mother    Dementia Mother    Cirrhosis Mother    Prostate cancer Father    Colon cancer Maternal Grandmother    Ovarian cancer Maternal Aunt     Prior to Admission medications   Medication Sig Start Date End Date Taking? Authorizing Provider  acetaminophen  (TYLENOL ) 325 MG tablet Take 325-650 mg by mouth every 6 (six) hours as needed for mild pain (pain score 1-3).    [provider]  butalbital -acetaminophen -caffeine  (FIORICET ) 50-325-40 MG tablet Take 1 tablet by mouth daily as needed for headache or migraine. 11/19/23   [provider]  diazepam  (VALIUM ) 10 MG tablet Take 1 tablet (10 mg total) by mouth 3 (three) times daily as needed for anxiety. 01/09/24   Plovsky, Doroteo Gasmen, MD  dicyclomine  (BENTYL ) 10 MG capsule Take 1 capsule (10 mg total) by mouth 3 (three) times daily as needed for spasms. 12/26/23   Evander Hills, PA-C  DULoxetine  (CYMBALTA ) 60 MG capsule Take 1 capsule (60 mg total) by mouth daily. 12/27/23  Arrien, Curlee Doss, MD  FERREX 150 150 MG capsule Take 150 mg by mouth daily.    [provider]  gabapentin  (NEURONTIN ) 300 MG capsule Take 2 capsules (600 mg total) by mouth 3 (three) times daily. 08/10/21   Zilphia Hilt, Charyl Coppersmith, MD  pantoprazole  (PROTONIX ) 40 MG tablet Take 1 tablet (40 mg total) by mouth 2 (two) times daily. 12/19/23 02/17/24  Shahmehdi, Seyed A, MD  phentermine (ADIPEX-P) 37.5 MG tablet Take 37.5 mg by mouth 3 (three) times a week. 12/07/23   [provider]  promethazine  (PHENERGAN ) 12.5 MG tablet Take 1 tablet (12.5 mg total) by mouth every 6 (six) hours as needed for nausea or vomiting. 12/26/23   Arrien, Curlee Doss, MD  rosuvastatin  (CRESTOR ) 20 MG tablet Take 1 tablet by mouth daily. 06/08/22   [provider]  sucralfate  (CARAFATE ) 1 g tablet Take 1 tablet (1 g total) by mouth 4 (four) times daily. 12/19/23  01/18/24  Bobbetta Burnet, MD  traMADol  (ULTRAM ) 50 MG tablet Take 1 tablet (50 mg total) by mouth every 6 (six) hours as needed for severe pain (pain score 7-10). 12/26/23   Arrien, Mauricio Daniel, MD  traZODone  (DESYREL ) 100 MG tablet Take 1 tablet (100 mg total) by mouth at bedtime as needed for sleep. 12/26/23 01/25/24  Albertus Alt, MD    Physical Exam: Vitals:   01/17/24 0900 01/17/24 0915 01/17/24 0930 01/17/24 1015  BP: 108/62 103/73 98/73 93/73   Pulse: 96 82 79 84  Resp: 17 14 14 16   Temp:      TempSrc:      SpO2: 100% 98% 98% 100%  Weight:      Height:        Constitutional: NAD, calm, uncomfortable Eyes: PERRL, lids and conjunctivae normal ENMT: Mucous membranes are dry. Posterior pharynx clear of any exudate or lesions.Normal dentition.  Neck: normal, supple, no masses, no thyromegaly Respiratory: clear to auscultation bilaterally, no wheezing, no crackles. Normal respiratory effort. No accessory muscle use.  Cardiovascular: normal s1, s2 sounds, no murmurs / rubs / gallops. No extremity edema. 2+ pedal pulses. No carotid bruits.  Abdomen: no tenderness, no masses palpated. No hepatosplenomegaly. Bowel sounds positive.  Musculoskeletal: no clubbing / cyanosis. No joint deformity upper and lower extremities. Good ROM, no contractures. Normal muscle tone.  Skin: no rashes, lesions, ulcers. No induration Neurologic: CN 2-12 grossly intact. Sensation intact, DTR normal. Strength 5/5 in all 4.  Psychiatric: Normal judgment and insight. Alert and oriented x 3. Normal mood.   Labs on Admission: I have personally reviewed following labs and imaging studies  CBC: Recent Labs  Lab 01/17/24 0807  WBC 5.7  NEUTROABS 3.5  HGB 12.0  HCT 37.1  MCV 95.6  PLT 253   Basic Metabolic Panel: Recent Labs  Lab 01/17/24 0807  NA 138  K 3.6  CL 107  CO2 22  GLUCOSE 107*  BUN 20  CREATININE 0.83  CALCIUM  8.9   GFR: Estimated Creatinine Clearance: 77.9 mL/min (by  C-G formula based on SCr of 0.83 mg/dL). Liver Function Tests: Recent Labs  Lab 01/17/24 0807  AST 23  ALT 10  ALKPHOS 102  BILITOT 0.7  PROT 7.1  ALBUMIN 4.0   Recent Labs  Lab 01/17/24 0807  LIPASE 34   No results for input(s): "AMMONIA" in the last 168 hours. Coagulation Profile: No results for input(s): "INR", "PROTIME" in the last 168 hours. Cardiac Enzymes: No results for input(s): "CKTOTAL", "CKMB", "CKMBINDEX", "TROPONINI" in  the last 168 hours. BNP (last 3 results) No results for input(s): "PROBNP" in the last 8760 hours. HbA1C: No results for input(s): "HGBA1C" in the last 72 hours. CBG: No results for input(s): "GLUCAP" in the last 168 hours. Lipid Profile: No results for input(s): "CHOL", "HDL", "LDLCALC", "TRIG", "CHOLHDL", "LDLDIRECT" in the last 72 hours. Thyroid  Function Tests: No results for input(s): "TSH", "T4TOTAL", "FREET4", "T3FREE", "THYROIDAB" in the last 72 hours. Anemia Panel: No results for input(s): "VITAMINB12", "FOLATE", "FERRITIN", "TIBC", "IRON ", "RETICCTPCT" in the last 72 hours. Urine analysis:    Component Value Date/Time   COLORURINE COLORLESS (A) 10/26/2023 0802   APPEARANCEUR HAZY (A) 10/26/2023 0802   LABSPEC 1.009 10/26/2023 0802   PHURINE 6.0 10/26/2023 0802   GLUCOSEU NEGATIVE 10/26/2023 0802   HGBUR NEGATIVE 10/26/2023 0802   BILIRUBINUR NEGATIVE 10/26/2023 0802   KETONESUR NEGATIVE 10/26/2023 0802   PROTEINUR NEGATIVE 10/26/2023 0802   UROBILINOGEN 0.2 07/05/2015 0627   NITRITE NEGATIVE 10/26/2023 0802   LEUKOCYTESUR NEGATIVE 10/26/2023 0802    Radiological Exams on Admission: CT ANGIO GI BLEED Result Date: 01/17/2024 CLINICAL DATA:  abdominal pain, hematemesis EXAM: CTA ABDOMEN AND PELVIS WITHOUT AND WITH CONTRAST TECHNIQUE: Multidetector CT imaging of the abdomen and pelvis was performed using the standard protocol during bolus administration of intravenous contrast. Multiplanar reconstructed images and MIPs were  obtained and reviewed to evaluate the vascular anatomy. RADIATION DOSE REDUCTION: This exam was performed according to the departmental dose-optimization program which includes automated exposure control, adjustment of the mA and/or kV according to patient size and/or use of iterative reconstruction technique. CONTRAST:  OMNIPAQUE  IOHEXOL  350 MG/ML SOLN COMPARISON:  CT scan angiography abdomen and pelvis from 12/19/2023. FINDINGS: VASCULAR Aorta: Normal caliber aorta without aneurysm, dissection, vasculitis or significant stenosis. Celiac: Patent without evidence of aneurysm, dissection, vasculitis or significant stenosis. SMA: Patent without evidence of aneurysm, dissection, vasculitis or significant stenosis. 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 All renal arteries are patent without evidence of aneurysm, dissection, vasculitis, fibromuscular dysplasia or significant stenosis. IMA: Patent without evidence of aneurysm, dissection, vasculitis or significant stenosis. Inflow: Patent without evidence of aneurysm, dissection, vasculitis or significant stenosis. Proximal Outflow: Bilateral common femoral and visualized portions of the superficial and profunda femoral arteries are patent without evidence of aneurysm, dissection, vasculitis or significant stenosis. Veins: No obvious venous abnormality within the limitations of this arterial phase study. Review of the MIP images confirms the above findings. NON-VASCULAR Lower chest: There are subpleural atelectatic changes in the visualized lung bases. No overt consolidation. No pleural effusion. The heart is normal in size. No pericardial effusion. Hepatobiliary: The liver is normal in size. Non-cirrhotic configuration. No suspicious mass. There are multiple scattered simple cysts throughout the liver with largest multi lobed cyst in the subcapsular left hepatic lobe, segment 3 measuring 3.5 x 6.2 cm. No intrahepatic or extrahepatic  bile duct dilation. Gallbladder is surgically absent. Pancreas: Unremarkable. No pancreatic ductal dilatation or surrounding inflammatory changes. Spleen: Within normal limits. No focal lesion. Adrenals/Urinary Tract: Adrenal glands are unremarkable. No suspicious renal mass. No hydronephrosis. No renal or ureteric calculi. Unremarkable urinary bladder. Stomach/Bowel: There is hyperattenuating focus in fundus of the stomach however, remains stable in size on the postcontrast images and may represent ingested food material. Correlate clinically. No disproportionate dilation of the small or large bowel loops. No evidence of abnormal bowel wall thickening or inflammatory changes. The appendix was not visualized; however there is no acute inflammatory process in the right lower  quadrant. No active extravasation of contrast noted to suggest active GI bleeding. Vascular/Lymphatic: No ascites or pneumoperitoneum. No abdominal or pelvic lymphadenopathy, by size criteria. No aneurysmal dilation of the major abdominal arteries. Reproductive: The uterus is surgically absent. No large adnexal mass. Other: There is a tiny fat containing umbilical hernia. The soft tissues and abdominal wall are otherwise unremarkable. Musculoskeletal: No suspicious osseous lesions. There are mild multilevel degenerative changes in the visualized spine. IMPRESSION: 1. No active GI bleeding noted. 2. Multiple other nonacute observations, as described above. Electronically Signed   By: Beula Brunswick M.D.   On: 01/17/2024 10:08   EKG: Independently reviewed. Sinus tachycardia   Assessment/Plan Principal Problem:   Upper GI hemorrhage Active Problems:   Depression   Generalized anxiety disorder with panic attacks   Chronic headaches   Hyperlipidemia   Hematemesis   Gastritis and gastroduodenitis   Angiectasia of gastrointestinal tract   Dyslipidemia   Abdominal pain   Question of recurrent upper GI hemorrhage - pt is reporting  acute onset of severe abdominal pain, nausea, emesis, hematemesis and hematochezia - Hg is reassuring at 12 - CT angio GI bleed study has no evidence of active GI hemorrhage - continue supportive measures for now - continue NPO status pending GI consultation - possible EGD pending GI consultation   Chronic abdominal pain  - no evidence found on CT scan, labs or clinical exam to warrant high dose IV opioids, this was given in ED, we will discontinue; can resume home pain medication - IV medications for nausea given as needed - IV fluids for supportive measures   History of gastritis/duodenitis - IV pantoprazole  BID ordered - NPO for now pending GI consultation   Depression/GAD  - resume home medications when reconciled by pharmacy  DVT prophylaxis: SCDs   Code Status: Full   Family Communication: none present   Disposition Plan: anticipate home   Consults called: GI   Admission status: INP   Time Spent: 55 mins   Level of care: Telemetry Faustino Hook MD [facility] How to contact the [facility] Attending or Consulting provider 7A - 7P or covering provider during after hours 7P -7A, for this patient?  Check the care team in [facility] and look for a) attending/consulting [facility] provider listed and b) the [facility] team listed Log into [email or web address] and use [place] universal password to access. If you do not have the password, please contact the hospital operator. Locate the [facility] provider you are looking for under [facility] and page to a number that you can be directly reached. If you still have difficulty reaching the provider, please page the [facility] (Director on Call) for the Hospitalists listed on amion for assistance.   If 7PM-7AM, please contact night-coverage [email or web address] Password [email or web address]  01/17/2024, 11:06 AM

## 2024-01-17 NOTE — H&P (View-Only) (Signed)
 Gastroenterology Consult   Referring Provider: No ref. provider found Primary Care Physician:  Ruel Cotta, Heinz Llano, MD Primary Gastroenterologist:  Dr. Alita Irwin   Patient ID: Sabrina [name]; [id number]; [birth year]   Admit date: 01/17/2024  LOS: 0 days   Date of Consultation: 01/17/2024  Reason for Consultation:  recurrent hematemesis   History of Present Illness   Sabrina [name] is a 58 y.o. year old female  with history of arthritis, bullous pemphigus, osteomyelitis, anxiety, hepatic cyst, hematemesis in the setting of esophagitis, gastric and duodenal angioectasias s/p APC with normal colonoscopy (other than hemorrhoids) and normal capsule endoscopy in April 2025, now returning to the ER today with chief complaint of hematemesis   ED Course: FOBT positive CT ANgio GI Bleed negative Hgb 12 (up from 10.1) 3 weeks ago  BP mildly soft in the ED at 93/73, mildy tachycardic with HR 115   Consult Patient states feeling good since her last discharge. She notes she started having cramping in her abdomen and went to the restroom where she had a BM that was diarrhea (loose not watery) with some specks of BRB noted initially but this transitioned to more volume of blood mixed in with her stools as she had further BMs. She then began feeling nauseated and started having vomiting with bright red emesis with blood clots noted. She reports abdominal pain starts down in lower abdomen up to mid abdomen, periumbilical area, traveling more to the Right side. She is trying to eat healthier, avoiding fried foods. She has a friend who is a nutritionist who has been helping her to improve her diet as well. She denies recent antibiotics. 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 Did have COVID a few weeks ago but reports mild symptoms that only last a few days.   She has been taking protonix  40mg  BID and carafate  at home. Not taking any NSAIDs at home.   States she received  some tramadol  but requesting something else for pain. Nausea continues.   States she was evaluated by gen surgery last admission who did not feel further evaluation by them was warranted. Has been recommended to have evaluation by GYN surgeon previously.   Last EGD: 12/2023  1cm hh, oozing gastric ulcers with oozing hemorrhage, injected, clips placed.  Prior EGDs: 12/14/23: - Normal esophagus.                           - Gastritis. Biopsied.                           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  10/2023 LA Grade A reflux esophagitis.                           - Gastritis. Biopsied.                           - 2 cm hiatal hernia.                           - Duodenal mucosal lymphangiectasia. Last Colonoscopy: April 2025: hemorrhoids Capsule endoscopy: April 2025: negative    Past Medical History:  Diagnosis Date   Arthritis    "knees" (09/06/2017)   Bullous pemphigus    Cat bite  06/2014   to left elbow   History of blood transfusion 1988   "when I had my baby"   Muscle weakness of lower extremity 2001; 09/05/2017   "resolved after a couple weeks; ?" (09/06/2017)   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HCC)    Poisoning, snake bite 04/08/2016   "[place]; RUE"   PONV (postoperative nausea and vomiting)    S/P right knee surgery    Situational anxiety    Staph infection ~ 2015   "left elbow and finger"   Thyroid  mass     Past Surgical History:  Procedure Laterality Date   APPENDECTOMY  ~ 1987   APPLICATION OF A-CELL OF EXTREMITY Left 08/05/2015   Procedure: APPLICATION OF A-CELL OF EXTREMITY;  Surgeon: Thornell Flirt, DO;  Location: [facility];  Service: Plastics;  Laterality: Left;   BIOPSY  10/26/2023   Procedure: BIOPSY;  Surgeon: Hargis Lias, MD;  [facility];  Service: Endoscopy;;   BREAST SURGERY Right 1990   "milk duct taken out"   CHONDROPLASTY Right 02/19/2019   Procedure: CHONDROPLASTY; EXCISION EXOSTOSIS;  Surgeon: Shirlee Dotter, MD;  Location: [facility];  Service: Orthopedics;  Laterality: Right;   COLONOSCOPY N/A 12/22/2023   Procedure: COLONOSCOPY;  Surgeon: Hargis Lias, MD;  [facility]: [facility];  Service: Endoscopy;  Laterality: N/A;   DEBRIDEMENT AND CLOSURE WOUND Left 07/01/2015   Procedure: LEFT ELBOW EXCISION OF WOUND WITH PRIMARY CLOSURE 2X5 CM ;  Surgeon: Thornell Flirt, DO;  Location: [facility];  Service: Plastics;  Laterality: Left;   ELBOW SURGERY Left X 23 in [place]  <06/2015   from a cat bite; all I&D   ESOPHAGOGASTRODUODENOSCOPY N/A 12/14/2023   Procedure: EGD (ESOPHAGOGASTRODUODENOSCOPY);  Surgeon: Hargis Lias, MD;  [facility];  Service: Endoscopy;  Laterality: N/A;   ESOPHAGOGASTRODUODENOSCOPY N/A 12/19/2023   Procedure: EGD (ESOPHAGOGASTRODUODENOSCOPY);  Surgeon: Umberto Ganong, Bearl Limes, MD;  [facility]: [facility];  Service: Gastroenterology;  Laterality: N/A;   ESOPHAGOGASTRODUODENOSCOPY (EGD) WITH PROPOFOL  N/A 10/26/2023   Procedure: ESOPHAGOGASTRODUODENOSCOPY (EGD) WITH PROPOFOL ;  Surgeon: Hargis Lias, MD;  [facility];  Service: Endoscopy;  Laterality: N/A;   GIVENS CAPSULE STUDY N/A 12/22/2023   Procedure: IMAGING PROCEDURE, GI TRACT, INTRALUMINAL, VIA CAPSULE;  Surgeon: Hargis Lias, MD;  Location: [facility];  Service: Endoscopy;  Laterality: N/A;   HOT HEMOSTASIS  12/14/2023   Procedure: EGD, WITH ARGON PLASMA COAGULATION;  Surgeon: Hargis Lias, MD;  Location: [facility];  Service: Endoscopy;;   I & D EXTREMITY Left 07/08/2015   Procedure: IRRIGATION AND DEBRIDEMENT EXTREMITY, DRAINAGE OF LEFT ARM WOUND, A-CELL PLACEMENT, WOUND VAC PLACEMENT;  Surgeon: Lindaann Requena Dillingham, DO;  Location: [facility];  Service: Plastics;  Laterality: Left;   INCISION AND DRAINAGE OF WOUND Left 08/05/2015   Procedure: IRRIGATION AND DEBRIDEMENT LEFT ELBOW WOUND, PLACEMENT OF ACELL;  Surgeon: Thornell Flirt, DO;   Location: [facility];  Service: Plastics;  Laterality: Left;   KNEE ARTHROSCOPY WITH MEDIAL MENISECTOMY Right 02/19/2019   Procedure: RIGHT KNEE ARTHROSCOPY, DEBRIDEMENT, PARTIAL MEDIAL AND LATERAL MENISECTOMY;  Surgeon: Shirlee Dotter, MD;  Location:  SUR[facility]ERY [facility];  Service: Orthopedics;  Laterality: Right;   LAPAROSCOPIC CHOLECYSTECTOMY  1998   SKIN GRAFT Left 2016   took from anterior thigh; placed at elbow   TONSILLECTOMY  ~ 2000   TOTAL ABDOMINAL HYSTERECTOMY  2003   WRIST SURGERY Right 01/2016  Prior to Admission medications   Medication Sig Start Date End Date Taking? Authorizing Provider  acetaminophen  (TYLENOL ) 325 MG tablet Take 325-650 mg by mouth every 6 (six) hours as needed for mild pain (pain score 1-3).    [provider]  butalbital -acetaminophen -caffeine  (FIORICET ) 50-325-40 MG tablet Take 1 tablet by mouth daily as needed for headache or migraine. 11/19/23   [provider]  diazepam  (VALIUM ) 10 MG tablet Take 1 tablet (10 mg total) by mouth 3 (three) times daily as needed for anxiety. 01/09/24   Plovsky, Doroteo Gasmen, MD  dicyclomine  (BENTYL ) 10 MG capsule Take 1 capsule (10 mg total) by mouth 3 (three) times daily as needed for spasms. 12/26/23   Evander Hills, PA-C  DULoxetine  (CYMBALTA ) 60 MG capsule Take 1 capsule (60 mg total) by mouth daily. 12/27/23   Arrien, Curlee Doss, MD  FERREX 150 150 MG capsule Take 150 mg by mouth daily.    [provider]  gabapentin  (NEURONTIN ) 300 MG capsule Take 2 capsules (600 mg total) by mouth 3 (three) times daily. 08/10/21   Zilphia Hilt, Charyl Coppersmith, MD  pantoprazole  (PROTONIX ) 40 MG tablet Take 1 tablet (40 mg total) by mouth 2 (two) times daily. 12/19/23 02/17/24  Shahmehdi, Seyed A, MD  phentermine (ADIPEX-P) 37.5 MG tablet Take 37.5 mg by mouth 3 (three) times a week. 12/07/23   [provider]  promethazine  (PHENERGAN ) 12.5 MG tablet Take 1 tablet (12.5 mg total) by  mouth every 6 (six) hours as needed for nausea or vomiting. 12/26/23   Arrien, Mauricio Daniel, MD  rosuvastatin  (CRESTOR ) 20 MG tablet Take 1 tablet by mouth daily. 06/08/22   [provider]  sucralfate  (CARAFATE ) 1 g tablet Take 1 tablet (1 g total) by mouth 4 (four) times daily. 12/19/23 01/18/24  Bobbetta Burnet, MD  traMADol  (ULTRAM ) 50 MG tablet Take 1 tablet (50 mg total) by mouth every 6 (six) hours as needed for severe pain (pain score 7-10). 12/26/23   Arrien, Mauricio Daniel, MD  traZODone  (DESYREL ) 100 MG tablet Take 1 tablet (100 mg total) by mouth at bedtime as needed for sleep. 12/26/23 01/25/24  Arrien, Mauricio Daniel, MD    Current Facility-Administered Medications  Medication Dose Route Frequency Provider Last Rate Last Admin   acetaminophen  (TYLENOL ) tablet 650 mg  650 mg Oral Q6H PRN Johnson, Clanford L, MD       Or   acetaminophen  (TYLENOL ) suppository 650 mg  650 mg Rectal Q6H PRN Johnson, Clanford L, MD       ketorolac  (TORADOL ) 15 MG/ML injection 15 mg  15 mg Intravenous Q6H PRN Lincoln Renshaw, Clanford L, MD   15 mg at 01/17/24 1135   lactated ringers  infusion   Intravenous Continuous Lincoln Renshaw, Clanford L, MD 125 mL/hr at 01/17/24 1129 New Bag at 01/17/24 1129   pantoprazole  (PROTONIX ) injection 40 mg  40 mg Intravenous Q12H Johnson, Clanford L, MD       prochlorperazine  (COMPAZINE ) injection 10 mg  10 mg Intravenous Q4H PRN Johnson, Clanford L, MD       traMADol  (ULTRAM ) tablet 50 mg  50 mg Oral Q6H PRN Johnson, Clanford L, MD   50 mg at 01/17/24 1124   traZODone  (DESYREL ) tablet 50 mg  50 mg Oral QHS PRN Rayfield Cairo, MD       Current Outpatient Medications  Medication Sig Dispense Refill   acetaminophen  (TYLENOL ) 325 MG tablet Take 325-650 mg by mouth every 6 (six) hours as needed for mild pain (  pain score 1-3).     butalbital -acetaminophen -caffeine  (FIORICET ) 50-325-40 MG tablet Take 1 tablet by mouth daily as needed for headache or migraine.     diazepam   (VALIUM ) 10 MG tablet Take 1 tablet (10 mg total) by mouth 3 (three) times daily as needed for anxiety. 90 tablet 3   dicyclomine  (BENTYL ) 10 MG capsule Take 1 capsule (10 mg total) by mouth 3 (three) times daily as needed for spasms. 90 capsule 1   DULoxetine  (CYMBALTA ) 60 MG capsule Take 1 capsule (60 mg total) by mouth daily. 30 capsule 0   FERREX 150 150 MG capsule Take 150 mg by mouth daily.     gabapentin  (NEURONTIN ) 300 MG capsule Take 2 capsules (600 mg total) by mouth 3 (three) times daily. 540 capsule 1   pantoprazole  (PROTONIX ) 40 MG tablet Take 1 tablet (40 mg total) by mouth 2 (two) times daily. 60 tablet 1   phentermine (ADIPEX-P) 37.5 MG tablet Take 37.5 mg by mouth 3 (three) times a week.     promethazine  (PHENERGAN ) 12.5 MG tablet Take 1 tablet (12.5 mg total) by mouth every 6 (six) hours as needed for nausea or vomiting. 10 tablet 0   rosuvastatin  (CRESTOR ) 20 MG tablet Take 1 tablet by mouth daily.     sucralfate  (CARAFATE ) 1 g tablet Take 1 tablet (1 g total) by mouth 4 (four) times daily. 120 tablet 0   traMADol  (ULTRAM ) 50 MG tablet Take 1 tablet (50 mg total) by mouth every 6 (six) hours as needed for severe pain (pain score 7-10). 10 tablet 0   traZODone  (DESYREL ) 100 MG tablet Take 1 tablet (100 mg total) by mouth at bedtime as needed for sleep. 30 tablet 0    Allergies as of 01/17/2024 - Review Complete 01/17/2024  Allergen Reaction Noted   Droperidol Palpitations and Other (See Comments) 07/01/2015   Ondansetron  Hives, Swelling, and Rash 03/09/2015   Benzonatate  Rash and Other (See Comments) 09/21/2015   Bromfed dm [pseudoeph-bromphen-dm] Anxiety 09/17/2021   Diclofenac  sodium Rash 08/14/2018   Flexeril [cyclobenzaprine] Anxiety 01/18/2017   Review of Systems   Gen: Denies any fever, chills, loss of appetite, change in weight or weight loss CV: Denies chest pain, heart palpitations, syncope, edema  Resp: Denies shortness of breath with rest, cough, wheezing,  coughing up blood, and pleurisy. GI: +diarrhea +blood speckled stools +nausea+ hematemesis +abdominal pain  GU : Denies urinary burning, blood in urine, urinary frequency, and urinary incontinence. MS: Denies joint pain, limitation of movement, swelling, cramps, and atrophy.  Derm: Denies rash, itching, dry skin, hives. Psych: Denies depression, anxiety, memory loss, hallucinations, and confusion. Heme: Denies bruising or bleeding Neuro:  Denies any headaches, dizziness, paresthesias, shaking  Physical Exam   Vital Signs in last 24 hours: Temp:  [97.6 F (36.4 C)] 97.6 F (36.4 C) (05/07 0742) Pulse Rate:  [79-115] 84 (05/07 1015) Resp:  [14-19] 16 (05/07 1015) BP: (93-108)/(62-73) 93/73 (05/07 1015) SpO2:  [98 %-100 %] 100 % (05/07 1015) Weight:  [78.9 kg] 78.9 kg (05/07 0730)   General:   Alert,  Well-developed, well-nourished, pleasant and cooperative in NAD Head:  Normocephalic and atraumatic. Eyes:  Sclera clear, no icterus.   Conjunctiva pink. Ears:  Normal auditory acuity. Mouth:  No deformity or lesions, dentition normal. Neck:  Supple; no masses Lungs:  Clear throughout to auscultation.   No wheezes, crackles, or rhonchi. No acute distress. Heart:  Regular rate and rhythm; no murmurs, clicks, rubs,  or gallops. Abdomen:  Soft, and nondistended. TTP of diffuse abdomen. No masses, hepatosplenomegaly or hernias noted. Normal bowel sounds, without guarding, and without rebound.   Neurologic:  Alert and  oriented x4. Skin:  Intact without significant lesions or rashes. Psych:  Alert and cooperative. Normal mood and affect.  Labs/Studies   Recent Labs Recent Labs    01/17/24 0807  WBC 5.7  HGB 12.0  HCT 37.1  PLT 253   BMET Recent Labs    01/17/24 0807  NA 138  K 3.6  CL 107  CO2 22  GLUCOSE 107*  BUN 20  CREATININE 0.83  CALCIUM  8.9   LFT Recent Labs    01/17/24 0807  PROT 7.1  ALBUMIN 4.0  AST 23  ALT 10  ALKPHOS 102  BILITOT 0.7   PT/INR No  results for input(s): "LABPROT", "INR" in the last 72 hours. Hepatitis Panel No results for input(s): "HEPBSAG", "HCVAB", "HEPAIGM", "HEPBIGM" in the last 72 hours. C-Diff No results for input(s): "CDIFFTOX" in the last 72 hours.  Radiology/Studies CT ANGIO GI BLEED Result Date: 01/17/2024 CLINICAL DATA:  abdominal pain, hematemesis EXAM: CTA ABDOMEN AND PELVIS WITHOUT AND WITH CONTRAST TECHNIQUE: Multidetector CT imaging of the abdomen and pelvis was performed using the standard protocol during bolus administration of intravenous contrast. Multiplanar reconstructed images and MIPs were obtained and reviewed to evaluate the vascular anatomy. RADIATION DOSE REDUCTION: This exam was performed according to the departmental dose-optimization program which includes automated exposure control, adjustment of the mA and/or kV according to patient size and/or use of iterative reconstruction technique. CONTRAST:  OMNIPAQUE  IOHEXOL  350 MG/ML SOLN COMPARISON:  CT scan angiography abdomen and pelvis from 12/19/2023. FINDINGS: VASCULAR Aorta: Normal caliber aorta without aneurysm, dissection, vasculitis or significant stenosis. Celiac: Patent without evidence of aneurysm, dissection, vasculitis or significant stenosis. SMA: Patent without evidence of aneurysm, dissection, vasculitis or significant stenosis. 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 All renal arteries are patent without evidence of aneurysm, dissection, vasculitis, fibromuscular dysplasia or significant stenosis. IMA: Patent without evidence of aneurysm, dissection, vasculitis or significant stenosis. Inflow: Patent without evidence of aneurysm, dissection, vasculitis or significant stenosis. Proximal Outflow: Bilateral common femoral and visualized portions of the superficial and profunda femoral arteries are patent without evidence of aneurysm, dissection, vasculitis or significant stenosis. Veins: No obvious venous  abnormality within the limitations of this arterial phase study. Review of the MIP images confirms the above findings. NON-VASCULAR Lower chest: There are subpleural atelectatic changes in the visualized lung bases. No overt consolidation. No pleural effusion. The heart is normal in size. No pericardial effusion. Hepatobiliary: The liver is normal in size. Non-cirrhotic configuration. No suspicious mass. There are multiple scattered simple cysts throughout the liver with largest multi lobed cyst in the subcapsular left hepatic lobe, segment 3 measuring 3.5 x 6.2 cm. No intrahepatic or extrahepatic bile duct dilation. Gallbladder is surgically absent. Pancreas: Unremarkable. No pancreatic ductal dilatation or surrounding inflammatory changes. Spleen: Within normal limits. No focal lesion. Adrenals/Urinary Tract: Adrenal glands are unremarkable. No suspicious renal mass. No hydronephrosis. No renal or ureteric calculi. Unremarkable urinary bladder. Stomach/Bowel: There is hyperattenuating focus in fundus of the stomach however, remains stable in size on the postcontrast images and may represent ingested food material. Correlate clinically. No disproportionate dilation of the small or large bowel loops. No evidence of abnormal bowel wall thickening or inflammatory changes. The appendix was not visualized; however there is no acute inflammatory process in the right lower quadrant. No active extravasation  of contrast noted to suggest active GI bleeding. Vascular/Lymphatic: No ascites or pneumoperitoneum. No abdominal or pelvic lymphadenopathy, by size criteria. No aneurysmal dilation of the major abdominal arteries. Reproductive: The uterus is surgically absent. No large adnexal mass. Other: There is a tiny fat containing umbilical hernia. The soft tissues and abdominal wall are otherwise unremarkable. Musculoskeletal: No suspicious osseous lesions. There are mild multilevel degenerative changes in the visualized spine.  IMPRESSION: 1. No active GI bleeding noted. 2. Multiple other nonacute observations, as described above. Electronically Signed   By: Beula Brunswick M.D.   On: 01/17/2024 10:08    Assessment   KELLEN WALKINSHAW is a 58 y.o. year old female with history of arthritis, bullous pemphigus, osteomyelitis, anxiety, hepatic cyst, hematemesis in the setting of esophagitis, gastric and duodenal angioectasias s/p APC with normal colonoscopy (other than hemorrhoids) and normal capsule endoscopy in April 2025, now returning to the ER today with chief complaint of recurrent hematemesis, GI consulted for further evaluation  Recurrent hematemesis/rectal bleeding:  multiple endoscopic evaluations since February to include negative capsule endoscopy and colonoscopy with hemorrhoids EGD x3 with gastric/duodenal angiectasias, gastritis, gastric ulcers. Reassuringly hgb is stable at 12. FOBT was positive. Some specks of blood noted in stools per patient. CT Angio GI bleed scan without overt bleeding source. Could be bleeding from any of the above sources. Would recommend trending h&h, monitoring for overt GI bleeding,  pursue repeat EGD tomorrow for further evaluation.   Diarrhea: onset last night with some specks of BRB present. Unclear etiology. Recent Colonoscopy in April as outlined above with hemorrhoids.  CTA without obvious findings of colitis or diverticular bleeding/diverticulitis. Recommend checking stool studies given diarrhea. Doubt rapid transit upper GI bleeding given hemodynamic stability but planning for EGD tomorrow.    Plan / Recommendations   PPI BID Carafate  1g QID  Trend h&h Monitor for overt GI bleeding Repeat upper endoscopy tomorrow  May need to consider octreotide therapy if bleeding thought to be secondary to angiectasias  7.  Stool studies due to diarrhea  8. Remain NPO    01/17/2024, 11:35 AM  Jiovanni Heeter L. Kassadee Carawan, MSN, APRN, AGNP-C Adult-Gerontology Nurse Practitioner [facility]  Gastroenterology at [facility]

## 2024-01-17 NOTE — Progress Notes (Signed)
   01/17/24 1823  TOC Brief Assessment  Insurance and Status Reviewed  Patient has primary care physician Yes  Home environment has been reviewed From home  Prior level of function: Independent  Prior/Current Home Services No current home services  Social Drivers of Health Review SDOH reviewed no interventions necessary  Readmission risk has been reviewed Yes  Transition of care needs no transition of care needs at this time   Transition of Care Department [facility]) has reviewed patient and no other TOC needs have been identified at this time. We will continue to monitor patient advancement through interdisciplinary progression rounds. If new patient needs arise, please place a TOC consult.

## 2024-01-18 ENCOUNTER — Inpatient Hospital Stay (HOSPITAL_COMMUNITY): Admitting: Certified Registered"

## 2024-01-18 ENCOUNTER — Encounter (HOSPITAL_COMMUNITY): Payer: Self-pay | Admitting: Family Medicine

## 2024-01-18 ENCOUNTER — Encounter (HOSPITAL_COMMUNITY)
Admission: EM | Disposition: A | Discharge status: Home / Self Care | Payer: Self-pay | Source: Home / Self Care | Attending: Family Medicine

## 2024-01-18 DIAGNOSIS — K259 Gastric ulcer, unspecified as acute or chronic, without hemorrhage or perforation: Secondary | ICD-10-CM | POA: Diagnosis not present

## 2024-01-18 DIAGNOSIS — R1084 Generalized abdominal pain: Secondary | ICD-10-CM

## 2024-01-18 DIAGNOSIS — K319 Disease of stomach and duodenum, unspecified: Secondary | ICD-10-CM

## 2024-01-18 DIAGNOSIS — E785 Hyperlipidemia, unspecified: Secondary | ICD-10-CM

## 2024-01-18 DIAGNOSIS — K449 Diaphragmatic hernia without obstruction or gangrene: Secondary | ICD-10-CM | POA: Diagnosis not present

## 2024-01-18 DIAGNOSIS — F411 Generalized anxiety disorder: Secondary | ICD-10-CM | POA: Diagnosis not present

## 2024-01-18 DIAGNOSIS — K31819 Angiodysplasia of stomach and duodenum without bleeding: Secondary | ICD-10-CM | POA: Diagnosis not present

## 2024-01-18 HISTORY — PX: ESOPHAGOGASTRODUODENOSCOPY: SHX5428

## 2024-01-18 LAB — COMPREHENSIVE METABOLIC PANEL WITH GFR
ALT: 22 U/L (ref 0–44)
AST: 29 U/L (ref 15–41)
Albumin: 3 g/dL — ABNORMAL LOW (ref 3.5–5.0)
Alkaline Phosphatase: 89 U/L (ref 38–126)
Anion gap: 6 (ref 5–15)
BUN: 18 mg/dL (ref 6–20)
CO2: 25 mmol/L (ref 22–32)
Calcium: 8.2 mg/dL — ABNORMAL LOW (ref 8.9–10.3)
Chloride: 108 mmol/L (ref 98–111)
Creatinine, Ser: 0.77 mg/dL (ref 0.44–1.00)
GFR, Estimated: >60 mL/min (ref >60)
Glucose, Bld: 86 mg/dL (ref 70–99)
Potassium: 3.1 mmol/L — ABNORMAL LOW (ref 3.5–5.1)
Sodium: 139 mmol/L (ref 135–145)
Total Bilirubin: 0.7 mg/dL (ref 0.0–1.2)
Total Protein: 5.3 g/dL — ABNORMAL LOW (ref 6.5–8.1)

## 2024-01-18 LAB — CBC
HCT: 29.4 % — ABNORMAL LOW (ref 36.0–46.0)
Hemoglobin: 9.6 g/dL — ABNORMAL LOW (ref 12.0–15.0)
MCH: 31.5 pg (ref 26.0–34.0)
MCHC: 32.7 g/dL (ref 30.0–36.0)
MCV: 96.4 fL (ref 80.0–100.0)
Platelets: 180 10*3/uL (ref 150–400)
RBC: 3.05 MIL/uL — ABNORMAL LOW (ref 3.87–5.11)
RDW: 14.7 % (ref 11.5–15.5)
WBC: 2.8 10*3/uL — ABNORMAL LOW (ref 4.0–10.5)
nRBC: 0 % (ref 0.0–0.2)

## 2024-01-18 LAB — MAGNESIUM: Magnesium: 2 mg/dL (ref 1.7–2.4)

## 2024-01-18 SURGERY — EGD (ESOPHAGOGASTRODUODENOSCOPY)
Anesthesia: General

## 2024-01-18 MED ORDER — FENTANYL CITRATE (PF) 100 MCG/2ML IJ SOLN: 25.0000 ug | INTRAMUSCULAR | Status: DC | PRN

## 2024-01-18 MED ORDER — FENTANYL CITRATE (PF) 100 MCG/2ML IJ SOLN
25.0000 ug | INTRAMUSCULAR | Status: DC | PRN
Start: 2024-01-18 — End: 2024-01-18

## 2024-01-18 MED ORDER — FENTANYL CITRATE PF 50 MCG/ML IJ SOSY: 50.0000 ug | PREFILLED_SYRINGE | INTRAMUSCULAR | Status: DC | PRN

## 2024-01-18 MED ORDER — LACTATED RINGERS IV SOLN: INTRAVENOUS | Status: DC | PRN

## 2024-01-18 MED ORDER — FENTANYL CITRATE PF 50 MCG/ML IJ SOSY
PREFILLED_SYRINGE | INTRAMUSCULAR | Status: AC
  Filled 2024-01-18: qty 1

## 2024-01-18 MED ORDER — PROPOFOL 500 MG/50ML IV EMUL
INTRAVENOUS | Status: DC | PRN
Start: 2024-01-18 — End: 2024-01-18
  Administered 2024-01-18: 100 ug/kg/min via INTRAVENOUS

## 2024-01-18 MED ORDER — ONDANSETRON HCL 4 MG/2ML IJ SOLN
INTRAMUSCULAR | Status: AC
  Filled 2024-01-18: qty 2

## 2024-01-18 MED ORDER — POTASSIUM CHLORIDE 10 MEQ/100ML IV SOLN
10.0000 meq | INTRAVENOUS | Status: AC
  Administered 2024-01-18 (×3): 10 meq via INTRAVENOUS
  Filled 2024-01-18 (×2): qty 100

## 2024-01-18 MED ORDER — FENTANYL CITRATE PF 50 MCG/ML IJ SOSY
25.0000 ug | PREFILLED_SYRINGE | INTRAMUSCULAR | Status: AC | PRN
  Administered 2024-01-18 – 2024-01-19 (×6): 50 ug via INTRAVENOUS
  Filled 2024-01-18 (×3): qty 1

## 2024-01-18 MED ORDER — LIDOCAINE HCL (CARDIAC) PF 100 MG/5ML IV SOSY
PREFILLED_SYRINGE | INTRAVENOUS | Status: DC | PRN
Start: 2024-01-18 — End: 2024-01-18
  Administered 2024-01-18: 80 mg via INTRATRACHEAL

## 2024-01-18 MED ORDER — GABAPENTIN 300 MG PO CAPS
600.0000 mg | ORAL_CAPSULE | Freq: Three times a day (TID) | ORAL | Status: DC
  Administered 2024-01-18 – 2024-01-19 (×3): 600 mg via ORAL
  Filled 2024-01-18 (×2): qty 2

## 2024-01-18 MED ORDER — LACTATED RINGERS IV SOLN: INTRAVENOUS | Status: DC

## 2024-01-18 MED ORDER — PROPOFOL 10 MG/ML IV BOLUS
INTRAVENOUS | Status: DC | PRN
  Administered 2024-01-18: 20 mg via INTRAVENOUS
  Administered 2024-01-18: 50 mg via INTRAVENOUS

## 2024-01-18 MED ORDER — SODIUM CHLORIDE 0.9 % IV SOLN
12.5000 mg | Freq: Once | INTRAVENOUS | Status: AC
  Administered 2024-01-18: 12.5 mg via INTRAVENOUS
  Filled 2024-01-18 (×2): qty 0.5

## 2024-01-18 MED ORDER — PROMETHAZINE HCL 25 MG/ML IJ SOLN: 12.5000 mg | Freq: Once | INTRAMUSCULAR | Status: DC | PRN

## 2024-01-18 NOTE — Op Note (Signed)
 [facility] Patient Name: Sabrina [name] Procedure Date: 01/18/2024 2:08 PM MRN: [id number] Date of Birth: 05/20/1966 Attending MD: Rolando Cliche. Mordechai April , [place], [id number] CSN: [id number] Age: 58 Admit Type: Inpatient Procedure:                Upper GI endoscopy Indications:              Hematemesis Providers:                Rolando Cliche. Mordechai April, DO, Tammy Vaught, RN, Graydon Lazier RN, RN, Alisa App, Annell Barrow Referring MD:              Medicines:                See the Anesthesia note for documentation of the                            administered medications Complications:            No immediate complications. Estimated Blood Loss:     Estimated blood loss was minimal. Procedure:                Pre-Anesthesia Assessment:                           - The anesthesia plan was to use monitored                            anesthesia care (MAC).                           After obtaining informed consent, the endoscope was                            passed under direct vision. Throughout the                            procedure, the patient's blood pressure, pulse, and                            oxygen saturations were monitored continuously. The                            GIF-H190 ([id number]) scope was introduced through the                            mouth, and advanced to the second part of duodenum.                            The upper GI endoscopy was accomplished without                            difficulty. The patient tolerated the procedure                            well.  Scope In: 2:24:56 PM Scope Out: 2:30:01 PM Total Procedure Duration: 0 hours 5 minutes 5 seconds  Findings:      A small hiatal hernia was present.      Multiple non-bleeding superficial gastric ulcers with a clean ulcer base       (Forrest Class III) were found in the gastric body. The largest lesion       was small, 2-3 mm in largest dimension. These correspond to previously        noted post APC ulcers and have nearly healed.      Patchy moderate inflammation characterized by erosions and erythema was       found in the gastric body and in the gastric antrum. Biopsies were taken       with a cold forceps for Helicobacter pylori testing.      The duodenal bulb, first portion of the duodenum and second portion of       the duodenum were normal. Impression:               - Small hiatal hernia.                           - Non-bleeding gastric ulcers with a clean ulcer                            base (Forrest Class III).                           - Gastritis. Biopsied.                           - Normal duodenal bulb, first portion of the                            duodenum and second portion of the duodenum. Moderate Sedation:      Per Anesthesia Care Recommendation:           - Return patient to hospital ward for ongoing care.                           - Soft diet.                           - Use a proton pump inhibitor PO BID.                           - Use sucralfate  suspension 1 gram PO QID.                           - Continue ot monitor H&H and transfuse as needed.                           - Overall exam today without active or stigmata of                            bleeding. Multiple nearly healed ulcers  corresponding to previously noted post APC gastric                            ulcers. Extensive/patchy gastritis noted. Possibly                            this is what may have oozed. Biopsies taken to rule                            out H. pylori. Procedure Code(s):        --- Professional ---                           [phone], Esophagogastroduodenoscopy, flexible,                            transoral; with biopsy, single or multiple Diagnosis Code(s):        --- Professional ---                           K44.9, Diaphragmatic hernia without obstruction or                            gangrene                           K25.9,  Gastric ulcer, unspecified as acute or                            chronic, without hemorrhage or perforation                           K29.70, Gastritis, unspecified, without bleeding                           K92.0, Hematemesis CPT copyright 2022 [employer]. All rights reserved. The codes documented in this report are preliminary and upon coder review may  be revised to meet current compliance requirements. Rolando Cliche. Mordechai April, DO Rolando Cliche. Mordechai April, DO 01/18/2024 2:36:15 PM This report has been signed electronically. Number of Addenda: 0

## 2024-01-18 NOTE — Progress Notes (Signed)
 Mobility Specialist Progress Note:    01/18/24 1100  Mobility  Activity Ambulated with assistance to bathroom;Ambulated with assistance in room  Level of Assistance Standby assist, set-up cues, supervision of patient - no hands on  Assistive Device None  Distance Ambulated (ft) 45 ft  Range of Motion/Exercises Active;All extremities  Activity Response Tolerated well  Mobility Referral Yes  Mobility visit 1 Mobility  Mobility Specialist Start Time (ACUTE ONLY) 1045  Mobility Specialist Stop Time (ACUTE ONLY) 1100  Mobility Specialist Time Calculation (min) (ACUTE ONLY) 15 min   Pt received in bed requesting assistance to bathroom. Required supervision to stand and ambulate with no AD. Tolerated well, asx throughout. Returned pt supine, all needs met.  Glinda Lapping Mobility Specialist Please contact via Special educational needs teacher or  Rehab office at [phone]

## 2024-01-18 NOTE — Progress Notes (Addendum)
 PROGRESS NOTE   KIRRA HEATER  WUJ:[id number] DOB: 1966/07/07 DOA: 01/17/2024 PCP: Michele Ahle, MD   Chief Complaint  Patient presents with   Abdominal Pain   Hematemesis   Level of care: Telemetry  Brief Admission History:  58 year old female with long history of chronic abdominal pain, opioid dependence, opioid seeking behaviors, history of gastric ulceration and hematemesis and hematochezia recently discharged from this facility on 12/26/2023 after being evaluated for upper GI bleeding receiving EGD status post treatment with APC and clips by Dr. Alita Irwin.  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  She had a colonoscopy as well showing internal hemorrhoids but no active bleeding.  She reports that she had been doing fairly well at home and following dietary recommendations and medications when last night she started having severe abdominal pain hematemesis and also had 2 bowel movements with bright red blood.  She says that the pain is worse now than it was 1 month ago.  She also is complaining of nausea and vomiting.  She was evaluated in the ED with a CT angio GI bleeding study with no evidence of active GI bleeding found.  Her hemoglobin was noted to be 12.0.  She was tachycardic on arrival which improved after IV fluid hydration.  She was also given nausea and pain medications and GI was consulted and recommended that she remain n.p.o. and be admitted for possible further endoscopy and treatments as needed.   Assessment and Plan:  Question of upper GI bleeding - EGD today with findings of nonbleeding gastric ulcers, gastritis, small hiatal hernia  - Dr. Mordechai April recommends PPI BID, sulcralfate suspension - continue soft foods diet and likely DC on 01/19/24   Chronic abdominal  pain - discussed with patient at length and with GI team - recommendation is for patient to follow up with her gynecologist to discuss our concern for possible  endometriosis causing her symptoms as we have not found any GI pathology to explain her pain symptoms  Gastritis - seen on EGD 01/18/24 with recommendation from Dr. Mordechai April to continue twice daily PPI therapy   Depression/GAD - resumed home behavioral health medications   Hypokalemia - IV replacement given  DVT prophylaxis: SCDs Code Status: Full  Family Communication: none present  Disposition: anticipating home tomorrow  Consultants:  GI Procedures:  EGD with Dr. Mordechai April   Antimicrobials:    Subjective: Pt reports she is eager to have EGD completed today. She is still having intermittent bouts of abdominal pain.   Objective: Vitals:   01/18/24 1545 01/18/24 1617 01/18/24 1622 01/18/24 1629  BP: 104/76 (!) 97/56 (!) 94/57 (!) 110/58  Pulse: (!) 57 72    Resp: 12  13 19   Temp: 98.2 F (36.8 C) 98.4 F (36.9 C)    TempSrc:  Oral    SpO2: 96% 97%    Weight:      Height:        Intake/Output Summary (Last 24 hours) at 01/18/2024 1735 Last data filed at 01/18/2024 0343 Gross per 24 hour  Intake 1974.85 ml  Output --  Net 1974.85 ml   Filed Weights   01/17/24 0730  Weight: 78.9 kg   Examination:  General exam: Appears calm and comfortable  Respiratory system: Clear to auscultation. Respiratory effort normal. Cardiovascular system: normal S1 & S2 heard. No JVD, murmurs, rubs, gallops or clicks. No pedal edema. Gastrointestinal system: Abdomen is nondistended, soft and nontender. No organomegaly or masses  felt. Normal bowel sounds heard. Central nervous system: Alert and oriented. No focal neurological deficits. Extremities: Symmetric 5 x 5 power. Skin: No rashes, lesions or ulcers. Psychiatry: Judgement and insight appear normal. Mood & affect appropriate.   Data Reviewed: I have personally reviewed following labs and imaging studies  CBC: Recent Labs  Lab 01/17/24 0807 01/18/24 0429  WBC 5.7 2.8*  NEUTROABS 3.5  --   HGB 12.0 9.6*  HCT 37.1 29.4*  MCV 95.6  96.4  PLT 253 180    Basic Metabolic Panel: Recent Labs  Lab 01/17/24 0807 01/18/24 0429  NA 138 139  K 3.6 3.1*  CL 107 108  CO2 22 25  GLUCOSE 107* 86  BUN 20 18  CREATININE 0.83 0.77  CALCIUM  8.9 8.2*  MG  --  2.0    CBG: No results for input(s): "GLUCAP" in the last 168 hours.  No results found for this or any previous visit (from the past 240 hours).   Radiology Studies: CT ANGIO GI BLEED Result Date: 01/17/2024 CLINICAL DATA:  abdominal pain, hematemesis EXAM: CTA ABDOMEN AND PELVIS WITHOUT AND WITH CONTRAST TECHNIQUE: Multidetector CT imaging of the abdomen and pelvis was performed using the standard protocol during bolus administration of intravenous contrast. Multiplanar reconstructed images and MIPs were obtained and reviewed to evaluate the vascular anatomy. RADIATION DOSE REDUCTION: This exam was performed according to the departmental dose-optimization program which includes automated exposure control, adjustment of the mA and/or kV according to patient size and/or use of iterative reconstruction technique. CONTRAST:  OMNIPAQUE  IOHEXOL  350 MG/ML SOLN COMPARISON:  CT scan angiography abdomen and pelvis from 12/19/2023. FINDINGS: VASCULAR Aorta: Normal caliber aorta without aneurysm, dissection, vasculitis or significant stenosis. Celiac: Patent without evidence of aneurysm, dissection, vasculitis or significant stenosis. SMA: Patent without evidence of aneurysm, dissection, vasculitis or significant stenosis. 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 All renal arteries are patent without evidence of aneurysm, dissection, vasculitis, fibromuscular dysplasia or significant stenosis. IMA: Patent without evidence of aneurysm, dissection, vasculitis or significant stenosis. Inflow: Patent without evidence of aneurysm, dissection, vasculitis or significant stenosis. Proximal Outflow: Bilateral common femoral and visualized portions of the superficial  and profunda femoral arteries are patent without evidence of aneurysm, dissection, vasculitis or significant stenosis. Veins: No obvious venous abnormality within the limitations of this arterial phase study. Review of the MIP images confirms the above findings. NON-VASCULAR Lower chest: There are subpleural atelectatic changes in the visualized lung bases. No overt consolidation. No pleural effusion. The heart is normal in size. No pericardial effusion. Hepatobiliary: The liver is normal in size. Non-cirrhotic configuration. No suspicious mass. There are multiple scattered simple cysts throughout the liver with largest multi lobed cyst in the subcapsular left hepatic lobe, segment 3 measuring 3.5 x 6.2 cm. No intrahepatic or extrahepatic bile duct dilation. Gallbladder is surgically absent. Pancreas: Unremarkable. No pancreatic ductal dilatation or surrounding inflammatory changes. Spleen: Within normal limits. No focal lesion. Adrenals/Urinary Tract: Adrenal glands are unremarkable. No suspicious renal mass. No hydronephrosis. No renal or ureteric calculi. Unremarkable urinary bladder. Stomach/Bowel: There is hyperattenuating focus in fundus of the stomach however, remains stable in size on the postcontrast images and may represent ingested food material. Correlate clinically. No disproportionate dilation of the small or large bowel loops. No evidence of abnormal bowel wall thickening or inflammatory changes. The appendix was not visualized; however there is no acute inflammatory process in the right lower quadrant. No active extravasation of contrast noted to suggest  active GI bleeding. Vascular/Lymphatic: No ascites or pneumoperitoneum. No abdominal or pelvic lymphadenopathy, by size criteria. No aneurysmal dilation of the major abdominal arteries. Reproductive: The uterus is surgically absent. No large adnexal mass. Other: There is a tiny fat containing umbilical hernia. The soft tissues and abdominal wall are  otherwise unremarkable. Musculoskeletal: No suspicious osseous lesions. There are mild multilevel degenerative changes in the visualized spine. IMPRESSION: 1. No active GI bleeding noted. 2. Multiple other nonacute observations, as described above. Electronically Signed   By: Beula Brunswick M.D.   On: 01/17/2024 10:08    Scheduled Meds:  DULoxetine   60 mg Oral Daily   gabapentin   600 mg Oral TID   iron  polysaccharides  150 mg Oral Daily   pantoprazole  (PROTONIX ) IV  40 mg Intravenous Q12H   rosuvastatin   20 mg Oral Daily   sucralfate   1 g Oral TID WC & HS   Continuous Infusions:   LOS: 1 day   Time spent: 55 mins  Kendyl Festa Lincoln Renshaw, MD How to contact the [facility] Attending or Consulting provider 7A - 7P or covering provider during after hours 7P -7A, for this patient?  Check the care team in [facility] and look for a) attending/consulting [facility] provider listed and b) the [facility] team listed Log into [email or web address] to find provider on call.  Locate the [facility] provider you are looking for under [facility] and page to a number that you can be directly reached. If you still have difficulty reaching the provider, please page the [facility] (Director on Call) for the Hospitalists listed on amion for assistance.  01/18/2024, 5:35 PM

## 2024-01-18 NOTE — Transfer of Care (Signed)
 Immediate Anesthesia Transfer of Care Note  Patient: Sabrina [name]  Procedure(s) Performed: EGD (ESOPHAGOGASTRODUODENOSCOPY)  Patient Location: PACU  Anesthesia Type:General  Level of Consciousness: awake and alert   Airway & Oxygen Therapy: Patient Spontanous Breathing  Post-op Assessment: Report given to RN and Post -op Vital signs reviewed and stable  Post vital signs: Reviewed and stable  Last Vitals:  Vitals Value Taken Time  BP    Temp    Pulse 82 01/18/24 1437  Resp 16 01/18/24 1437  SpO2 100 % 01/18/24 1437  Vitals shown include unfiled device data.  Last Pain:  Vitals:   01/18/24 1419  TempSrc:   PainSc: 8       Patients Stated Pain Goal: 3 (01/18/24 1331)  Complications: No notable events documented.

## 2024-01-18 NOTE — Anesthesia Preprocedure Evaluation (Signed)
 Anesthesia Evaluation  Patient identified by MRN, date of birth, ID band Patient awake    Reviewed: Allergy & Precautions, H&P , NPO status , Patient's Chart, lab work & pertinent test results, reviewed documented beta blocker date and time   History of Anesthesia Complications (+) PONV and history of anesthetic complications  Airway Mallampati: II  TM Distance: >3 FB Neck ROM: full    Dental no notable dental hx.    Pulmonary neg pulmonary ROS   Pulmonary exam normal breath sounds clear to auscultation       Cardiovascular Exercise Tolerance: Good hypertension, negative cardio ROS  Rhythm:regular Rate:Normal     Neuro/Psych  Headaches PSYCHIATRIC DISORDERS Anxiety Depression     Neuromuscular disease negative neurological ROS  negative psych ROS   GI/Hepatic negative GI ROS, Neg liver ROS, PUD,,,  Endo/Other  negative endocrine ROS    Renal/GU negative Renal ROS  negative genitourinary   Musculoskeletal   Abdominal   Peds  Hematology negative hematology ROS (+)   Anesthesia Other Findings   Reproductive/Obstetrics negative OB ROS                             Anesthesia Physical Anesthesia Plan  ASA: 2 and emergent  Anesthesia Plan: General   Post-op Pain Management:    Induction:   PONV Risk Score and Plan: Propofol  infusion  Airway Management Planned:   Additional Equipment:   Intra-op Plan:   Post-operative Plan:   Informed Consent: I have reviewed the patients History and Physical, chart, labs and discussed the procedure including the risks, benefits and alternatives for the proposed anesthesia with the patient or authorized representative who has indicated his/her understanding and acceptance.     Dental Advisory Given  Plan Discussed with: CRNA  Anesthesia Plan Comments:        Anesthesia Quick Evaluation

## 2024-01-18 NOTE — Interval H&P Note (Signed)
 History and Physical Interval Note:  01/18/2024 1:41 PM  Sabrina [name]  has presented today for surgery, with the diagnosis of hematemesis, history of gastric ulcers and angiectasias.  The various methods of treatment have been discussed with the patient and family. After consideration of risks, benefits and other options for treatment, the patient has consented to  Procedure(s): EGD (ESOPHAGOGASTRODUODENOSCOPY) (N/A) as a surgical intervention.  The patient's history has been reviewed, patient examined, no change in status, stable for surgery.  I have reviewed the patient's chart and labs.  Questions were answered to the patient's satisfaction.     Vinetta Greening

## 2024-01-18 NOTE — Anesthesia Postprocedure Evaluation (Signed)
 Anesthesia Post Note  Patient: Sabrina [name]  Procedure(s) Performed: EGD (ESOPHAGOGASTRODUODENOSCOPY)  Patient location during evaluation: PACU Anesthesia Type: General Level of consciousness: awake and alert Pain management: pain level controlled Vital Signs Assessment: post-procedure vital signs reviewed and stable Respiratory status: spontaneous breathing, nonlabored ventilation, respiratory function stable and patient connected to nasal cannula oxygen Cardiovascular status: blood pressure returned to baseline and stable Postop Assessment: no apparent nausea or vomiting Anesthetic complications: no   No notable events documented.   Last Vitals:  Vitals:   01/18/24 0435 01/18/24 1331  BP: 100/68 109/69  Pulse: 72 77  Resp:  14  Temp: 36.9 C 36.8 C  SpO2: 97% 100%    Last Pain:  Vitals:   01/18/24 1419  TempSrc:   PainSc: 8                  Coretha Dew

## 2024-01-19 ENCOUNTER — Telehealth: Payer: Self-pay | Admitting: Gastroenterology

## 2024-01-19 ENCOUNTER — Encounter (HOSPITAL_COMMUNITY): Payer: Self-pay | Admitting: Internal Medicine

## 2024-01-19 DIAGNOSIS — R1084 Generalized abdominal pain: Secondary | ICD-10-CM | POA: Diagnosis not present

## 2024-01-19 DIAGNOSIS — R519 Headache, unspecified: Secondary | ICD-10-CM | POA: Diagnosis not present

## 2024-01-19 DIAGNOSIS — K922 Gastrointestinal hemorrhage, unspecified: Principal | ICD-10-CM

## 2024-01-19 DIAGNOSIS — K31819 Angiodysplasia of stomach and duodenum without bleeding: Secondary | ICD-10-CM | POA: Diagnosis not present

## 2024-01-19 DIAGNOSIS — G8929 Other chronic pain: Secondary | ICD-10-CM

## 2024-01-19 LAB — BASIC METABOLIC PANEL WITH GFR
Anion gap: 8 (ref 5–15)
BUN: 13 mg/dL (ref 6–20)
CO2: 26 mmol/L (ref 22–32)
Calcium: 8.1 mg/dL — ABNORMAL LOW (ref 8.9–10.3)
Chloride: 106 mmol/L (ref 98–111)
Creatinine, Ser: 0.69 mg/dL (ref 0.44–1.00)
GFR, Estimated: >60 mL/min (ref >60)
Glucose, Bld: 107 mg/dL — ABNORMAL HIGH (ref 70–99)
Potassium: 3.5 mmol/L (ref 3.5–5.1)
Sodium: 140 mmol/L (ref 135–145)

## 2024-01-19 LAB — CBC
HCT: 30.6 % — ABNORMAL LOW (ref 36.0–46.0)
Hemoglobin: 9.5 g/dL — ABNORMAL LOW (ref 12.0–15.0)
MCH: 29.9 pg (ref 26.0–34.0)
MCHC: 31 g/dL (ref 30.0–36.0)
MCV: 96.2 fL (ref 80.0–100.0)
Platelets: 163 10*3/uL (ref 150–400)
RBC: 3.18 MIL/uL — ABNORMAL LOW (ref 3.87–5.11)
RDW: 14 % (ref 11.5–15.5)
WBC: 3.1 10*3/uL — ABNORMAL LOW (ref 4.0–10.5)
nRBC: 0 % (ref 0.0–0.2)

## 2024-01-19 MED ORDER — SUCRALFATE 1 G PO TABS: 1.0000 g | ORAL_TABLET | Freq: Four times a day (QID) | ORAL | 0 refills | Status: DC

## 2024-01-19 MED ORDER — OXYCODONE HCL 5 MG PO TABS: 5.0000 mg | ORAL_TABLET | Freq: Three times a day (TID) | ORAL | 0 refills | Status: AC | PRN

## 2024-01-19 NOTE — Plan of Care (Signed)

## 2024-01-19 NOTE — Discharge Summary (Signed)
 Physician Discharge Summary  Sabrina [name]:[id number] DOB: 16-Mar-1966 DOA: 01/17/2024  PCP: Michele Ahle, MD GI: [facility]  Admit date: 01/17/2024 Discharge date: 01/19/2024  Admitted From:  Home  Disposition:  Home   Recommendations for Outpatient Follow-up:  Follow up with PCP in 1 weeks Follow up with [facility] as scheduled Follow up with OB/GYN in 2 weeks  Please obtain BMP/CBC in 1-2 weeks  Discharge Condition: STABLE   CODE STATUS: FULL DIET: soft foods diet, advance as tolerated   Brief Hospitalization Summary: Please see all hospital notes, images, labs for full details of the hospitalization. Admission provider HPI:  58 year old female with long history of chronic abdominal pain, opioid dependence, opioid seeking behaviors, history of gastric ulceration and hematemesis and hematochezia recently discharged from this facility on 12/26/2023 after being evaluated for upper GI bleeding receiving EGD status post treatment with APC and clips by Dr. Alita Irwin.  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  She had a colonoscopy as well showing internal hemorrhoids but no active bleeding.  She reports that she had been doing fairly well at home and following dietary recommendations and medications when last night she started having severe abdominal pain hematemesis and also had 2 bowel movements with bright red blood.  She says that the pain is worse now than it was 1 month ago.  She also is complaining of nausea and vomiting.  She was evaluated in the ED with a CT angio GI bleeding study with no evidence of active GI bleeding found.  Her hemoglobin was noted to be 12.0.  She was tachycardic on arrival which improved after IV fluid hydration.  She was also given nausea and pain medications and GI was consulted and recommended that she remain n.p.o. and be admitted for possible further endoscopy and treatments as needed.  Hospital Course by  listed problems   Question of upper GI bleeding - EGD with findings of nonbleeding gastric ulcers, gastritis, small hiatal hernia  - Dr. Mordechai April recommends PPI BID, sulcralfate suspension - continue soft foods diet and DC on 01/19/24    Chronic abdominal  pain - discussed with patient at length and with GI team - recommendation is for patient to follow up with her gynecologist to discuss our concern for possible endometriosis causing her symptoms as we have not found any GI pathology to explain her pain symptoms - referral to [facility] OB per pt request   Gastritis - seen on EGD 01/18/24 with recommendation from Dr. Mordechai April to continue twice daily PPI therapy    Depression/GAD - resumed home behavioral health medications    Hypokalemia - IV replacement given and repleted   Discharge Diagnoses:  Principal Problem:   Upper GI hemorrhage Active Problems:   Depression   Generalized anxiety disorder with panic attacks   Chronic headaches   Hyperlipidemia   Hematemesis   Gastritis and gastroduodenitis   Angiectasia of gastrointestinal tract   Dyslipidemia   Abdominal pain  Discharge Instructions: Discharge Instructions     Ambulatory referral to Obstetrics / Gynecology   Complete by: As directed       Allergies as of 01/19/2024       Reactions   Droperidol Palpitations, Other (See Comments)   Elevates BP increases HR   Ondansetron  Hives, Swelling, Rash   Spots around IV site IV Zofran    Benzonatate  Rash, Other (See Comments)   Caused burning pain in eyes  Watery eyes  Vision changes  Conjunctivitis   Bromfed Dm [pseudoeph-bromphen-dm] Anxiety   Diclofenac  Sodium Rash   Flexeril [cyclobenzaprine] Anxiety        Medication List     STOP taking these medications    traMADol  50 MG tablet Commonly known as: ULTRAM    traZODone  100 MG tablet Commonly known as: DESYREL        TAKE these medications    acetaminophen  325 MG tablet Commonly known as:  TYLENOL  Take 325-650 mg by mouth every 6 (six) hours as needed for mild pain (pain score 1-3).   butalbital -acetaminophen -caffeine  50-325-40 MG tablet Commonly known as: FIORICET  Take 1 tablet by mouth daily as needed for headache or migraine.   diazepam  10 MG tablet Commonly known as: VALIUM  Take 1 tablet (10 mg total) by mouth 3 (three) times daily as needed for anxiety.   DULoxetine  60 MG capsule Commonly known as: CYMBALTA  Take 1 capsule (60 mg total) by mouth daily.   Ferrex 150 150 MG capsule Generic drug: iron  polysaccharides Take 150 mg by mouth daily.   gabapentin  300 MG capsule Commonly known as: NEURONTIN  Take 2 capsules (600 mg total) by mouth 3 (three) times daily.   oxyCODONE  5 MG immediate release tablet Commonly known as: Roxicodone  Take 1 tablet (5 mg total) by mouth every 8 (eight) hours as needed for up to 4 days for severe pain (pain score 7-10).   pantoprazole  40 MG tablet Commonly known as: PROTONIX  Take 1 tablet (40 mg total) by mouth 2 (two) times daily.   phentermine 37.5 MG tablet Commonly known as: ADIPEX-P Take 37.5 mg by mouth 3 (three) times a week.   rosuvastatin  20 MG tablet Commonly known as: CRESTOR  Take 1 tablet by mouth daily.   sucralfate  1 g tablet Commonly known as: Carafate  Take 1 tablet (1 g total) by mouth 4 (four) times daily.   tiZANidine  4 MG tablet Commonly known as: ZANAFLEX  Take 4 mg by mouth at bedtime as needed for muscle spasms.        Follow-up Information     Ruel Cotta, Heinz Llano, MD. Schedule an appointment as soon as possible for a visit in 1 week(s).   Specialty: Family Medicine Why: Hospital Follow Up Contact information: [address] [place] [address] [place] [address] [phone]         [facility] for [facility] at [facility]. Schedule an appointment as soon as possible for a visit in 2 week(s).   Specialty: Obstetrics and Gynecology Why: Hospital Follow Up Contact  information: [address] [place]  [address] [phone]               Allergies  Allergen Reactions   Droperidol Palpitations and Other (See Comments)    Elevates BP increases HR   Ondansetron  Hives, Swelling and Rash    Spots around IV site IV Zofran    Benzonatate  Rash and Other (See Comments)    Caused burning pain in eyes  Watery eyes  Vision changes  Conjunctivitis   Bromfed Dm [Pseudoeph-Bromphen-Dm] Anxiety   Diclofenac  Sodium Rash   Flexeril [Cyclobenzaprine] Anxiety   Allergies as of 01/19/2024       Reactions   Droperidol Palpitations, Other (See Comments)   Elevates BP increases HR   Ondansetron  Hives, Swelling, Rash   Spots around IV site IV Zofran    Benzonatate  Rash, Other (See Comments)   Caused burning pain in eyes  Watery eyes  Vision changes  Conjunctivitis   Bromfed Dm [  pseudoeph-bromphen-dm] Anxiety   Diclofenac  Sodium Rash   Flexeril [cyclobenzaprine] Anxiety        Medication List     STOP taking these medications    traMADol  50 MG tablet Commonly known as: ULTRAM    traZODone  100 MG tablet Commonly known as: DESYREL        TAKE these medications    acetaminophen  325 MG tablet Commonly known as: TYLENOL  Take 325-650 mg by mouth every 6 (six) hours as needed for mild pain (pain score 1-3).   butalbital -acetaminophen -caffeine  50-325-40 MG tablet Commonly known as: FIORICET  Take 1 tablet by mouth daily as needed for headache or migraine.   diazepam  10 MG tablet Commonly known as: VALIUM  Take 1 tablet (10 mg total) by mouth 3 (three) times daily as needed for anxiety.   DULoxetine  60 MG capsule Commonly known as: CYMBALTA  Take 1 capsule (60 mg total) by mouth daily.   Ferrex 150 150 MG capsule Generic drug: iron  polysaccharides Take 150 mg by mouth daily.   gabapentin  300 MG capsule Commonly known as: NEURONTIN  Take 2 capsules (600 mg total) by mouth 3 (three) times daily.   oxyCODONE  5 MG  immediate release tablet Commonly known as: Roxicodone  Take 1 tablet (5 mg total) by mouth every 8 (eight) hours as needed for up to 4 days for severe pain (pain score 7-10).   pantoprazole  40 MG tablet Commonly known as: PROTONIX  Take 1 tablet (40 mg total) by mouth 2 (two) times daily.   phentermine 37.5 MG tablet Commonly known as: ADIPEX-P Take 37.5 mg by mouth 3 (three) times a week.   rosuvastatin  20 MG tablet Commonly known as: CRESTOR  Take 1 tablet by mouth daily.   sucralfate  1 g tablet Commonly known as: Carafate  Take 1 tablet (1 g total) by mouth 4 (four) times daily.   tiZANidine  4 MG tablet Commonly known as: ZANAFLEX  Take 4 mg by mouth at bedtime as needed for muscle spasms.        Procedures/Studies: CT ANGIO GI BLEED Result Date: 01/17/2024 CLINICAL DATA:  abdominal pain, hematemesis EXAM: CTA ABDOMEN AND PELVIS WITHOUT AND WITH CONTRAST TECHNIQUE: Multidetector CT imaging of the abdomen and pelvis was performed using the standard protocol during bolus administration of intravenous contrast. Multiplanar reconstructed images and MIPs were obtained and reviewed to evaluate the vascular anatomy. RADIATION DOSE REDUCTION: This exam was performed according to the departmental dose-optimization program which includes automated exposure control, adjustment of the mA and/or kV according to patient size and/or use of iterative reconstruction technique. CONTRAST:  OMNIPAQUE  IOHEXOL  350 MG/ML SOLN COMPARISON:  CT scan angiography abdomen and pelvis from 12/19/2023. FINDINGS: VASCULAR Aorta: Normal caliber aorta without aneurysm, dissection, vasculitis or significant stenosis. Celiac: Patent without evidence of aneurysm, dissection, vasculitis or significant stenosis. SMA: Patent without evidence of aneurysm, dissection, vasculitis or significant stenosis. 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 All renal arteries are patent without evidence of  aneurysm, dissection, vasculitis, fibromuscular dysplasia or significant stenosis. IMA: Patent without evidence of aneurysm, dissection, vasculitis or significant stenosis. Inflow: Patent without evidence of aneurysm, dissection, vasculitis or significant stenosis. Proximal Outflow: Bilateral common femoral and visualized portions of the superficial and profunda femoral arteries are patent without evidence of aneurysm, dissection, vasculitis or significant stenosis. Veins: No obvious venous abnormality within the limitations of this arterial phase study. Review of the MIP images confirms the above findings. NON-VASCULAR Lower chest: There are subpleural atelectatic changes in the visualized lung bases. No overt consolidation. No pleural  effusion. The heart is normal in size. No pericardial effusion. Hepatobiliary: The liver is normal in size. Non-cirrhotic configuration. No suspicious mass. There are multiple scattered simple cysts throughout the liver with largest multi lobed cyst in the subcapsular left hepatic lobe, segment 3 measuring 3.5 x 6.2 cm. No intrahepatic or extrahepatic bile duct dilation. Gallbladder is surgically absent. Pancreas: Unremarkable. No pancreatic ductal dilatation or surrounding inflammatory changes. Spleen: Within normal limits. No focal lesion. Adrenals/Urinary Tract: Adrenal glands are unremarkable. No suspicious renal mass. No hydronephrosis. No renal or ureteric calculi. Unremarkable urinary bladder. Stomach/Bowel: There is hyperattenuating focus in fundus of the stomach however, remains stable in size on the postcontrast images and may represent ingested food material. Correlate clinically. No disproportionate dilation of the small or large bowel loops. No evidence of abnormal bowel wall thickening or inflammatory changes. The appendix was not visualized; however there is no acute inflammatory process in the right lower quadrant. No active extravasation of contrast noted to suggest  active GI bleeding. Vascular/Lymphatic: No ascites or pneumoperitoneum. No abdominal or pelvic lymphadenopathy, by size criteria. No aneurysmal dilation of the major abdominal arteries. Reproductive: The uterus is surgically absent. No large adnexal mass. Other: There is a tiny fat containing umbilical hernia. The soft tissues and abdominal wall are otherwise unremarkable. Musculoskeletal: No suspicious osseous lesions. There are mild multilevel degenerative changes in the visualized spine. IMPRESSION: 1. No active GI bleeding noted. 2. Multiple other nonacute observations, as described above. Electronically Signed   By: Beula Brunswick M.D.   On: 01/17/2024 10:08   DG Abd 2 Views Result Date: 12/25/2023 CLINICAL DATA:  Lower abdominal pain. Vomiting for the past 2 days. Status post esophagogastroduodenoscopy on 12/19/2023. Previous appendectomy. EXAM: ABDOMEN - 2 VIEW COMPARISON:  Abdomen and pelvis CT dated 12/19/2023 FINDINGS: Normal bowel-gas pattern without free peritoneal air. Normal amount of stool. Stable bilateral upper and right lower quadrant surgical clips. Interval endoscopy capsule in the right inferior pelvis. Stable mild thoracolumbar scoliosis and degenerative changes IMPRESSION: 1. No acute abnormality. 2. Interval endoscopy capsule in the right inferior pelvis, most likely in the distal small bowel or cecum. Electronically Signed   By: Catherin Closs M.D.   On: 12/25/2023 11:31     Subjective: Pt says that she is intermittently having abdominal pain, she says she will follow up with gynecologist to be evaluated for endometriosis.  She says she has been tolerating diet well.  She is agreeable to follow up with [facility] outpatient.    Discharge Exam: Vitals:   01/18/24 2140 01/19/24 0539  BP: (!) 92/59 93/60  Pulse: 80 91  Resp: 20 20  Temp: 97.6 F (36.4 C) 98.4 F (36.9 C)  SpO2: 98% 100%   Vitals:   01/18/24 1629 01/18/24 1823 01/18/24 2140 01/19/24 0539  BP: (!) 110/58  93/69 (!) 92/59 93/60  Pulse:  79 80 91  Resp: 19  20 20   Temp:  98 F (36.7 C) 97.6 F (36.4 C) 98.4 F (36.9 C)  TempSrc:  Oral Oral Oral  SpO2:  100% 98% 100%  Weight:      Height:       General: Pt is alert, awake, not in acute distress Cardiovascular: RRR, S1/S2 +, no rubs, no gallops Respiratory: CTA bilaterally, no wheezing, no rhonchi Abdominal: Soft, NT, ND, bowel sounds + Extremities: no edema, no cyanosis   The results of significant diagnostics from this hospitalization (including imaging, microbiology, ancillary and laboratory) are listed below for reference.  Microbiology: No results found for this or any previous visit (from the past 240 hours).   Labs: BNP (last 3 results) No results for input(s): "BNP" in the last 8760 hours. Basic Metabolic Panel: Recent Labs  Lab 01/17/24 0807 01/18/24 0429 01/19/24 0305  NA 138 139 140  K 3.6 3.1* 3.5  CL 107 108 106  CO2 22 25 26   GLUCOSE 107* 86 107*  BUN 20 18 13   CREATININE 0.83 0.77 0.69  CALCIUM  8.9 8.2* 8.1*  MG  --  2.0  --    Liver Function Tests: Recent Labs  Lab 01/17/24 0807 01/18/24 0429  AST 23 29  ALT 10 22  ALKPHOS 102 89  BILITOT 0.7 0.7  PROT 7.1 5.3*  ALBUMIN 4.0 3.0*   Recent Labs  Lab 01/17/24 0807  LIPASE 34   No results for input(s): "AMMONIA" in the last 168 hours. CBC: Recent Labs  Lab 01/17/24 0807 01/18/24 0429 01/19/24 0305  WBC 5.7 2.8* 3.1*  NEUTROABS 3.5  --   --   HGB 12.0 9.6* 9.5*  HCT 37.1 29.4* 30.6*  MCV 95.6 96.4 96.2  PLT 253 180 163   Cardiac Enzymes: No results for input(s): "CKTOTAL", "CKMB", "CKMBINDEX", "TROPONINI" in the last 168 hours. BNP: Invalid input(s): "POCBNP" CBG: No results for input(s): "GLUCAP" in the last 168 hours. D-Dimer No results for input(s): "DDIMER" in the last 72 hours. Hgb A1c No results for input(s): "HGBA1C" in the last 72 hours. Lipid Profile No results for input(s): "CHOL", "HDL", "LDLCALC", "TRIG",  "CHOLHDL", "LDLDIRECT" in the last 72 hours. Thyroid  function studies No results for input(s): "TSH", "T4TOTAL", "T3FREE", "THYROIDAB" in the last 72 hours.  Invalid input(s): "FREET3" Anemia work up No results for input(s): "VITAMINB12", "FOLATE", "FERRITIN", "TIBC", "IRON ", "RETICCTPCT" in the last 72 hours. Urinalysis    Component Value Date/Time   COLORURINE YELLOW 01/17/2024 1240   APPEARANCEUR CLEAR 01/17/2024 1240   LABSPEC >1.046 (H) 01/17/2024 1240   PHURINE 5.0 01/17/2024 1240   GLUCOSEU NEGATIVE 01/17/2024 1240   HGBUR NEGATIVE 01/17/2024 1240   BILIRUBINUR NEGATIVE 01/17/2024 1240   KETONESUR NEGATIVE 01/17/2024 1240   PROTEINUR NEGATIVE 01/17/2024 1240   UROBILINOGEN 0.2 07/05/2015 0627   NITRITE NEGATIVE 01/17/2024 1240   LEUKOCYTESUR NEGATIVE 01/17/2024 1240   Sepsis Labs Recent Labs  Lab 01/17/24 0807 01/18/24 0429 01/19/24 0305  WBC 5.7 2.8* 3.1*   Microbiology No results found for this or any previous visit (from the past 240 hours).  Time coordinating discharge:  45 mins  SIGNED:  Faustino Hook, MD  [facility] 01/19/2024, 10:42 AM How to contact the [facility] Attending or Consulting provider 7A - 7P or covering provider during after hours 7P -7A, for this patient?  Check the care team in [facility] and look for a) attending/consulting [facility] provider listed and b) the [facility] team listed Log into [email or web address] and use [place]'s universal password to access. If you do not have the password, please contact the hospital operator. Locate the [facility] provider you are looking for under [facility] and page to a number that you can be directly reached. If you still have difficulty reaching the provider, please page the [facility] (Director on Call) for the Hospitalists listed on amion for assistance.

## 2024-01-19 NOTE — Telephone Encounter (Signed)
 Please arrange hospital follow-up with Dr. Alita Irwin in 2-3 weeks.  If no availability then can be with Elida Grounds or Antony Baumgartner who have seen her most recently.

## 2024-01-19 NOTE — Plan of Care (Signed)
  Problem: Clinical Measurements: Goal: Ability to maintain clinical measurements within normal limits will improve Outcome: Progressing Goal: Will remain free from infection Outcome: Progressing   Problem: Activity: Goal: Risk for activity intolerance will decrease Outcome: Progressing   Problem: Coping: Goal: Level of anxiety will decrease Outcome: Progressing   Problem: Elimination: Goal: Will not experience complications related to bowel motility Outcome: Progressing   Problem: Pain Managment: Goal: General experience of comfort will improve and/or be controlled Outcome: Progressing

## 2024-01-23 ENCOUNTER — Encounter (INDEPENDENT_AMBULATORY_CARE_PROVIDER_SITE_OTHER): Payer: Self-pay

## 2024-01-23 LAB — SURGICAL PATHOLOGY

## 2024-01-24 ENCOUNTER — Encounter: Admitting: Obstetrics & Gynecology

## 2024-01-24 ENCOUNTER — Telehealth (INDEPENDENT_AMBULATORY_CARE_PROVIDER_SITE_OTHER): Payer: Self-pay

## 2024-01-24 NOTE — Telephone Encounter (Signed)
 Patient called today says she has been having lower abdominal pain starting at the pelvic area, and radiates upward. She says she was found to have ulcers and a hernia. She says she is not able to eat much or keep her medications down as she is having some issues also with nausea and vomiting. She reports have vomited up her medications from this am. She says she is taking Carfate QID,Dicyclomine  10 mg QID, a probiotic bid, and Protonix  40 mg bid. She is wanting to know if you could send her in something for nausea, but she is allergic to zofran , wanted to know if you could possibly send in something for her abdominal pain. Has an upcoming appointment here in the office with Dr. Alita Irwin on 02/13/2024. Uses [facility] in [place], [place]. Please advise.

## 2024-01-25 NOTE — Telephone Encounter (Signed)
 Patient left voicemail and asked for a follow up on her message she left yesterday at 4:59. I called her back this morning after listening to message and she said she was feeling better and said instead of something for pain she wanted to see if she could get phenergan  for nausea. She said she is allergic to zofran .   [facility] [place]

## 2024-02-05 ENCOUNTER — Ambulatory Visit: Payer: Self-pay | Admitting: Internal Medicine

## 2024-02-07 ENCOUNTER — Encounter (HOSPITAL_COMMUNITY): Payer: Self-pay

## 2024-02-07 ENCOUNTER — Ambulatory Visit (HOSPITAL_COMMUNITY): Admitting: Psychiatry

## 2024-02-08 ENCOUNTER — Encounter: Admitting: Obstetrics & Gynecology

## 2024-02-13 ENCOUNTER — Ambulatory Visit (INDEPENDENT_AMBULATORY_CARE_PROVIDER_SITE_OTHER): Admitting: Gastroenterology

## 2024-02-22 ENCOUNTER — Other Ambulatory Visit (HOSPITAL_BASED_OUTPATIENT_CLINIC_OR_DEPARTMENT_OTHER): Payer: Self-pay

## 2024-03-05 ENCOUNTER — Encounter (HOSPITAL_COMMUNITY): Payer: Self-pay | Admitting: Psychiatry

## 2024-03-05 ENCOUNTER — Ambulatory Visit (HOSPITAL_BASED_OUTPATIENT_CLINIC_OR_DEPARTMENT_OTHER): Admitting: Psychiatry

## 2024-03-05 ENCOUNTER — Other Ambulatory Visit: Payer: Self-pay

## 2024-03-05 VITALS — BP 114/79 | HR 94 | Ht 64.0 in | Wt 172.0 lb

## 2024-03-05 DIAGNOSIS — F324 Major depressive disorder, single episode, in partial remission: Secondary | ICD-10-CM | POA: Diagnosis not present

## 2024-03-05 MED ORDER — DIAZEPAM 10 MG PO TABS: 10.0000 mg | ORAL_TABLET | Freq: Three times a day (TID) | ORAL | 5 refills | Status: DC | PRN

## 2024-03-05 MED ORDER — DULOXETINE HCL 60 MG PO CPEP: 60.0000 mg | ORAL_CAPSULE | Freq: Every day | ORAL | 6 refills | Status: DC

## 2024-03-05 NOTE — Progress Notes (Signed)
 This is used to review the [facility] MD/PA/NP OP Progress Note  03/05/2024 3:45 PM Sabrina [name]  MRN:  [id number]  Chief Complaint:     Today patient fortunately is doing actually very well.  She has had a number of procedures and has been in and out of the hospital.  She had GI bleeds.  She has had multiple ulcers.  He sees a gastroenterologist in the next week or to try to figure out the etiology of the ulcers.  She had a cyst on her liver which has been determined to be benign.  She has had an enlarged thyroid  which has been biopsied and found also to be benign.  Patient says she feels somewhat dysphoria but is mainly related to the multiple hospitalizations she has had.  While she was in the hospital he switched her Effexor  to Cymbalta  without a problem.  She has run out of the Cymbalta  we are going to restart them.  She takes Valium  10 mg 3 times daily which is very stable for her.  The patient denies use of alcohol or drugs.  She continues to work full-time.  She spends a lot of time with her horse and loves her dogs.  She is a good social group.  The patient is functioning actually very well.  Her mood is reasonably good.  She does have a lot of medical problems which look like they are benign at this time. Virtual Visit via Telephone Note  I connected with Sabrina [name] on 03/05/24 at  3:00 PM EDT by telephone and verified that I am speaking with the correct person using two identifiers.  Location: Patient: home Provider: office   I discussed the limitations, risks, security and privacy concerns of performing an evaluation and management service by telephone and the availability of in person appointments. I also discussed with the patient that there may be a patient responsible charge related to this service. The patient expressed understanding and agreed to proceed.     I discussed the assessment and treatment plan with the patient. The patient was provided an opportunity to ask questions  and all were answered. The patient agreed with the plan and demonstrated an understanding of the instructions.   The patient was advised to call back or seek an in-person evaluation if the symptoms worsen or if the condition fails to improve as anticipated.  I provided 30 minutes of non-face-to-face time during this encounter.   Sabrina LILLETTE Lo, MD   Virtual Visit via Telephone Note  I connected with Sabrina [name] on 03/05/24 at  3:00 PM EDT by telephone and verified that I am speaking with the correct person using two identifiers.  Location: Patient: home Provider: office   I discussed the limitations, risks, security and privacy concerns of performing an evaluation and management service by telephone and the availability of in person appointments. I also discussed with the patient that there may be a patient responsible charge related to this service. The patient expressed understanding and agreed to proceed.     I discussed the assessment and treatment plan with the patient. The patient was provided an opportunity to ask questions and all were answered. The patient agreed with the plan and demonstrated an understanding of the instructions.   The patient was advised to call back or seek an in-person evaluation if the symptoms worsen or if the condition fails to improve as anticipated.  I provided 30 minutes of non-face-to-face time during this encounter.  Sabrina LILLETTE Lo, MD          Past Medical History:  Past Medical History:  Diagnosis Date   Arthritis    knees (09/06/2017)   Bullous pemphigus    Cat bite 06/2014   to left elbow   History of blood transfusion 1988   when I had my baby   Muscle weakness of lower extremity 2001; 09/05/2017   resolved after a couple weeks; ? (09/06/2017)   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HCC)    Poisoning, snake bite 04/08/2016   [place]; RUE   PONV (postoperative nausea and vomiting)    S/P right knee surgery     Situational anxiety    Staph infection ~ 2015   left elbow and finger   Thyroid  mass     Past Surgical History:  Procedure Laterality Date   APPENDECTOMY  ~ 1987   APPLICATION OF A-CELL OF EXTREMITY Left 08/05/2015   Procedure: APPLICATION OF A-CELL OF EXTREMITY;  Surgeon: Estefana GORMAN Fritter, DO;  Location: [facility];  Service: Plastics;  Laterality: Left;   BIOPSY  10/26/2023   Procedure: BIOPSY;  Surgeon: [name], MD;  [facility];  Service: Endoscopy;;   BREAST SURGERY Right 1990   milk duct taken out   CHONDROPLASTY Right 02/19/2019   Procedure: CHONDROPLASTY; EXCISION EXOSTOSIS;  Surgeon: Anderson Maude ORN, MD;  Location: [facility];  Service: Orthopedics;  Laterality: Right;   COLONOSCOPY N/A 12/22/2023   Procedure: COLONOSCOPY;  Surgeon: [name], MD;  [facility];  Service: Endoscopy;  Laterality: N/A;   DEBRIDEMENT AND CLOSURE WOUND Left 07/01/2015   Procedure: LEFT ELBOW EXCISION OF WOUND WITH PRIMARY CLOSURE 2X5 CM ;  Surgeon: Estefana GORMAN Fritter, DO;  Location: [facility];  Service: Plastics;  Laterality: Left;   ELBOW SURGERY Left X 23 in [place]  <06/2015   from a cat bite; all I&D   ESOPHAGOGASTRODUODENOSCOPY N/A 12/14/2023   Procedure: EGD (ESOPHAGOGASTRODUODENOSCOPY);  Surgeon: [name], MD;  [facility];  Service: Endoscopy;  Laterality: N/A;   ESOPHAGOGASTRODUODENOSCOPY N/A 12/19/2023   Procedure: EGD (ESOPHAGOGASTRODUODENOSCOPY);  Surgeon: Eartha Flavors, Toribio, MD;  [facility];  Service: Gastroenterology;  Laterality: N/A;   ESOPHAGOGASTRODUODENOSCOPY N/A 01/18/2024   Procedure: EGD (ESOPHAGOGASTRODUODENOSCOPY);  Surgeon: Cindie Carlin POUR, DO;  [facility];  Service: Endoscopy;  Laterality: N/A;   ESOPHAGOGASTRODUODENOSCOPY (EGD) WITH PROPOFOL  N/A 10/26/2023   Procedure: ESOPHAGOGASTRODUODENOSCOPY (EGD) WITH PROPOFOL ;  Surgeon: [name], MD;  [facility];  Service: Endoscopy;  Laterality: N/A;   GIVENS CAPSULE STUDY N/A 12/22/2023   Procedure: IMAGING PROCEDURE, GI TRACT, INTRALUMINAL, VIA CAPSULE;  Surgeon: [name], MD;  [facility];  Service: Endoscopy;  Laterality: N/A;   HOT HEMOSTASIS  12/14/2023   Procedure: EGD, WITH ARGON PLASMA COAGULATION;  Surgeon: [name], MD;  [facility];  Service: Endoscopy;;   I & D EXTREMITY Left 07/08/2015   Procedure: IRRIGATION AND DEBRIDEMENT EXTREMITY, DRAINAGE OF LEFT ARM WOUND, A-CELL PLACEMENT, WOUND VAC PLACEMENT;  Surgeon: Estefana GORMAN Dillingham, DO;  Location: [facility];  Service: Plastics;  Laterality: Left;   INCISION AND DRAINAGE OF WOUND Left 08/05/2015   Procedure: IRRIGATION AND DEBRIDEMENT LEFT ELBOW WOUND, PLACEMENT OF ACELL;  Surgeon: Estefana GORMAN Fritter, DO;  Location: [facility];  Service: Plastics;  Laterality: Left;   KNEE ARTHROSCOPY WITH MEDIAL MENISECTOMY Right 02/19/2019   Procedure:  RIGHT KNEE ARTHROSCOPY, DEBRIDEMENT, PARTIAL MEDIAL AND LATERAL MENISECTOMY;  Surgeon: Anderson Maude ORN, MD;  Location: [facility];  Service: Orthopedics;  Laterality: Right;   LAPAROSCOPIC CHOLECYSTECTOMY  1998   SKIN GRAFT Left 2016   took from anterior thigh; placed at elbow   TONSILLECTOMY  ~ 2000   TOTAL ABDOMINAL HYSTERECTOMY  2003   WRIST SURGERY Right 01/2016    Family Psychiatric History: See intake H&P for full details. Reviewed, with no updates at this time.   Family History:  Family History  Problem Relation Age of Onset   Liver disease Mother    Dementia Mother    Cirrhosis Mother    Prostate cancer Father    Colon cancer Maternal Grandmother    Ovarian cancer Maternal Aunt     Social History:  Social History   Socioeconomic History   Marital status: Divorced    Spouse name: Not on file   Number of children: 1   Years of education: Not on file   Highest education  level: Associate degree: academic program  Occupational History   Occupation: unemployed  Tobacco Use   Smoking status: Never   Smokeless tobacco: Never  Vaping Use   Vaping status: Never Used  Substance and Sexual Activity   Alcohol use: Not Currently    Alcohol/week: 0.0 standard drinks of alcohol   Drug use: No   Sexual activity: Not Currently  Other Topics Concern   Not on file  Social History Narrative   Pateint is right-handed. She lives alone in a single level home. She rarely drinks caffeine . She is limited to exercise due to knee injuries.   Social Drivers of Health   Financial Resource Strain: High Risk (06/05/2022)   Received from [facility] prior to 11/12/2022[facility]   Overall Financial Resource Strain (CARDIA)    Difficulty of Paying Living Expenses: Hard  Food Insecurity: No Food Insecurity (01/17/2024)   Hunger Vital Sign    Worried About Running Out of Food in the Last Year: Never true    Ran Out of Food in the Last Year: Never true  Recent Concern: Food Insecurity - Food Insecurity Present (12/19/2023)   Hunger Vital Sign    Worried About Running Out of Food in the Last Year: Sometimes true    Ran Out of Food in the Last Year: Patient declined  Transportation Needs: No Transportation Needs (01/17/2024)   PRAPARE - Administrator, Civil Service (Medical): No    Lack of Transportation (Non-Medical): No  Physical Activity: Inactive (06/05/2022)   [facility] visits prior to 11/12/2022., [facility]   Exercise Vital Sign    On average, how many days per week do you engage in moderate to strenuous exercise (like a brisk walk)?: 0 days    On average, how many minutes do you engage in exercise at this level?: 10 min  Stress: Stress Concern Present (06/05/2022)   [facility] from [facility] visits prior to 11/12/2022., [facility]   [employer] of Occupational  Health - Occupational Stress Questionnaire    Feeling of Stress : Rather much  Social Connections: Moderately Isolated (12/21/2023)   Social Connection and Isolation Panel    Frequency of Communication with Friends and Family: More than three times a week    Frequency of Social Gatherings with Friends and Family: Once a week    Attends Religious Services: More than 4  times per year    Active Member of Clubs or Organizations: No    Attends Banker Meetings: Never    Marital Status: Divorced    Allergies:  Allergies  Allergen Reactions   Droperidol Palpitations and Other (See Comments)    Elevates BP increases HR   Ondansetron  Hives, Swelling and Rash    Spots around IV site IV Zofran    Benzonatate  Rash and Other (See Comments)    Caused burning pain in eyes  Watery eyes  Vision changes  Conjunctivitis   Bromfed Dm [Pseudoeph-Bromphen-Dm] Anxiety   Diclofenac  Sodium Rash   Flexeril [Cyclobenzaprine] Anxiety    Metabolic Disorder Labs: Lab Results  Component Value Date   HGBA1C 5.4 03/20/2020   MPG 108 03/20/2020   MPG  07/05/2015    QUESTIONABLE IDENTIFICATION / INCORRECTLY LABELED SPECIMEN   No results found for: PROLACTIN Lab Results  Component Value Date   CHOL 267 (H) 03/20/2020   TRIG 76 03/20/2020   HDL 67 03/20/2020   CHOLHDL 4.0 03/20/2020   VLDL 29 04/27/2015   LDLCALC 182 (H) 03/20/2020   LDLCALC 122 (H) 06/18/2018   Lab Results  Component Value Date   TSH 1.52 03/20/2020   TSH 2.890 06/18/2018    Therapeutic Level Labs: No results found for: LITHIUM No results found for: VALPROATE No results found for: CBMZ  Current Medications: Current Outpatient Medications  Medication Sig Dispense Refill   acetaminophen  (TYLENOL ) 325 MG tablet Take 325-650 mg by mouth every 6 (six) hours as needed for mild pain (pain score 1-3).     butalbital -acetaminophen -caffeine  (FIORICET ) 50-325-40 MG tablet Take 1 tablet by mouth daily as needed for  headache or migraine.     FERREX 150 150 MG capsule Take 150 mg by mouth daily.     gabapentin  (NEURONTIN ) 300 MG capsule Take 2 capsules (600 mg total) by mouth 3 (three) times daily. 540 capsule 1   pantoprazole  (PROTONIX ) 40 MG tablet Take 1 tablet (40 mg total) by mouth 2 (two) times daily. 60 tablet 1   phentermine (ADIPEX-P) 37.5 MG tablet Take 37.5 mg by mouth 3 (three) times a week.     rosuvastatin  (CRESTOR ) 20 MG tablet Take 1 tablet by mouth daily.     sucralfate  (CARAFATE ) 1 g tablet Take 1 tablet (1 g total) by mouth 4 (four) times daily. 120 tablet 0   tiZANidine  (ZANAFLEX ) 4 MG tablet Take 4 mg by mouth at bedtime as needed for muscle spasms.     diazepam  (VALIUM ) 10 MG tablet Take 1 tablet (10 mg total) by mouth 3 (three) times daily as needed for anxiety. 90 tablet 5   DULoxetine  (CYMBALTA ) 60 MG capsule Take 1 capsule (60 mg total) by mouth daily. 30 capsule 6   No current facility-administered medications for this visit.     Musculoskeletal: Strength & Muscle Tone: within normal limits Gait & Station: normal Patient leans: N/A  Psychiatric Specialty Exam: ROS  Blood pressure 114/79, pulse 94, height 5' 4 (1.626 m), weight 172 lb (78 kg).Body mass index is 29.52 kg/m.  General Appearance: Casual and Well Groomed  Eye Contact:  Good  Speech:  Clear and Coherent  Volume:  Normal  Mood:  Euthymic  Affect:  Congruent  Thought Process:  Goal Directed and Descriptions of Associations: Intact  Orientation:  Full (Time, Place, and Person)  Thought Content: Logical   Suicidal Thoughts:  No  Homicidal Thoughts:  No  Memory:  Immediate;   Fair  Judgement:  Fair  Insight:  Fair  Psychomotor Activity:  Normal  Concentration:  Concentration: Good  Recall:  Good  Fund of Knowledge: Good  Language: Good  Akathisia:  Negative  Handed:  Right  AIMS (if indicated): not done  Assets:  Communication Skills Desire for Improvement Housing Transportation  ADL's:  Intact   Cognition: WNL  Sleep:  Fair   Screenings: PHQ2-9    Flowsheet Row Office Visit from 01/24/2020 in [facility] Video Visit from 12/25/2019 in [facility] [place] Office Visit from 09/21/2018 in [facility] Office Visit from 06/25/2018 in [facility] Office Visit from 06/18/2018 in [facility]  PHQ-2 Total Score 2 0 1 0 0  PHQ-9 Total Score 5 2 -- -- --   Flowsheet Row ED to Hosp-Admission (Discharged) from 01/17/2024 in [facility] ED to Hosp-Admission (Discharged) from 12/21/2023 in [facility] ED to Hosp-Admission (Discharged) from 12/19/2023 in [facility]  C-SSRS RISK CATEGORY No Risk No Risk No Risk     Assessment and Plan:     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  Today we will clarify that she is on Cymbalta  60 mg.  This is a preventive effort because she is likely going to have ceased the surgery and the suspension is that she is going to have pain.  It makes sense for her to be on Cymbalta .  She will continue taking Cymbalta  and she will continue taking Valium  10 mg 3 times daily.  The patient will return to see me in 3 months.  I believe she is quite stable at this time.  Status of current problems: gradually improving  Labs Ordered: No orders of the defined types were placed in this encounter.   Labs Reviewed: n/a  Collateral Obtained/Records Reviewed: n/a  Plan:  Continue Lexapro  20 mg daily Increase Seroquel  to 200 mg nightly Return to clinic in 3-4 months, transfer care to Dr. Tasia Sabrina LILLETTE Tasia, MD 03/05/2024, 3:45 PM

## 2024-03-10 ENCOUNTER — Telehealth: Admitting: Physician Assistant

## 2024-03-10 DIAGNOSIS — R053 Chronic cough: Secondary | ICD-10-CM

## 2024-03-10 NOTE — Patient Instructions (Signed)
  Lonell LITTIE Cha, thank you for joining [name], PA-C for today's virtual visit.  While this provider is not your primary care provider (PCP), if your PCP is located in our provider database this encounter information will be shared with them immediately following your visit.   A [facility] MyChart account gives you access to today's visit and all your visits, tests, and labs performed at [facility]  click here if you don't have a [facility] MyChart account or go to [email or web address]  Consent: (Patient) Lonell LITTIE Cha provided verbal consent for this virtual visit at the beginning of the encounter.  Current Medications:  Current Outpatient Medications:    acetaminophen  (TYLENOL ) 325 MG tablet, Take 325-650 mg by mouth every 6 (six) hours as needed for mild pain (pain score 1-3)., Disp: , Rfl:    butalbital -acetaminophen -caffeine  (FIORICET ) 50-325-40 MG tablet, Take 1 tablet by mouth daily as needed for headache or migraine., Disp: , Rfl:    diazepam  (VALIUM ) 10 MG tablet, Take 1 tablet (10 mg total) by mouth 3 (three) times daily as needed for anxiety., Disp: 90 tablet, Rfl: 5   DULoxetine  (CYMBALTA ) 60 MG capsule, Take 1 capsule (60 mg total) by mouth daily., Disp: 30 capsule, Rfl: 6   FERREX 150 150 MG capsule, Take 150 mg by mouth daily., Disp: , Rfl:    gabapentin  (NEURONTIN ) 300 MG capsule, Take 2 capsules (600 mg total) by mouth 3 (three) times daily., Disp: 540 capsule, Rfl: 1   pantoprazole  (PROTONIX ) 40 MG tablet, Take 1 tablet (40 mg total) by mouth 2 (two) times daily., Disp: 60 tablet, Rfl: 1   phentermine (ADIPEX-P) 37.5 MG tablet, Take 37.5 mg by mouth 3 (three) times a week., Disp: , Rfl:    rosuvastatin  (CRESTOR ) 20 MG tablet, Take 1 tablet by mouth daily., Disp: , Rfl:    sucralfate  (CARAFATE ) 1 g tablet, Take 1 tablet (1 g total) by mouth 4 (four) times daily., Disp: 120 tablet, Rfl: 0   tiZANidine  (ZANAFLEX ) 4 MG tablet, Take 4 mg by mouth at bedtime  as needed for muscle spasms., Disp: , Rfl:    Medications ordered in this encounter:  No orders of the defined types were placed in this encounter.    *If you need refills on other medications prior to your next appointment, please contact your pharmacy*  Follow-Up: Call back or seek an in-person evaluation if the symptoms worsen or if the condition fails to improve as anticipated.  [facility] [phone]  Other Instructions Please report to the nearest Emergency room with any worsening symptoms. Follow up with primary care provider (PCP) in 2 -3 days.    If you have been instructed to have an in-person evaluation today at a local Urgent Care facility, please use the link below. It will take you to a list of all of our available [facility], including address, phone number and hours of operation. Please do not delay care.  [facility]  If you or a family member do not have a primary care provider, use the link below to schedule a visit and establish care. When you choose a [facility] primary care physician or advanced practice provider, you gain a long-term partner in health. Find a Primary Care Provider  Learn more about 's in-office and virtual care options:  - Get Care Now

## 2024-03-10 NOTE — Progress Notes (Signed)
 Virtual Visit Consent   Sabrina [name], you are scheduled for a virtual visit with a [facility] provider today. Just as with appointments in the office, your consent must be obtained to participate. Your consent will be active for this visit and any virtual visit you may have with one of our providers in the next 365 days. If you have a MyChart account, a copy of this consent can be sent to you electronically.  As this is a virtual visit, video technology does not allow for your provider to perform a traditional examination. This may limit your provider's ability to fully assess your condition. If your provider identifies any concerns that need to be evaluated in person or the need to arrange testing (such as labs, EKG, etc.), we will make arrangements to do so. Although advances in technology are sophisticated, we cannot ensure that it will always work on either your end or our end. If the connection with a video visit is poor, the visit may have to be switched to a telephone visit. With either a video or telephone visit, we are not always able to ensure that we have a secure connection.  By engaging in this virtual visit, you consent to the provision of healthcare and authorize for your insurance to be billed (if applicable) for the services provided during this visit. Depending on your insurance coverage, you may receive a charge related to this service.  I need to obtain your verbal consent now. Are you willing to proceed with your visit today? [name] has provided verbal consent on 03/10/2024 for a virtual visit (video or telephone). Sabrina [name], [place]  Date: 03/10/2024 2:03 PM   Virtual Visit via Video Note   I, Sabrina [name], connected with  [name]  ([id number], Jun 01, 1966) on 03/10/24 at  2:00 PM EDT by a video-enabled telemedicine application and verified that I am speaking with the correct person using two identifiers.  Location: Patient: Virtual Visit Location Patient:  Home Provider: Virtual Visit Location Provider: Home Office   I discussed the limitations of evaluation and management by telemedicine and the availability of in person appointments. The patient expressed understanding and agreed to proceed.    History of Present Illness: Sabrina [name] is a 58 y.o. who identifies as a female who was assigned female at birth, and is being seen today for on going.  HPI: Cough This is a recurrent problem. The current episode started in the past 7 days. The problem has been waxing and waning. The problem occurs every few minutes. The cough is Non-productive. Pertinent negatives include no chest pain, chills, ear congestion, ear pain, fever, headaches, heartburn, hemoptysis, myalgias, nasal congestion, postnasal drip, rash, rhinorrhea, sore throat, shortness of breath, sweats, weight loss or wheezing. Nothing aggravates the symptoms. She has tried a beta-agonist inhaler for the symptoms. Her past medical history is significant for bronchitis.    Problems:  Patient Active Problem List   Diagnosis Date Noted   Upper GI hemorrhage 01/17/2024   MDD (major depressive disorder), recurrent episode, moderate (HCC) 12/25/2023   Hypokalemia 12/23/2023   Dyslipidemia 12/22/2023   Obesity, class 1 12/22/2023   Abdominal pain 12/22/2023   Periumbilical abdominal pain 12/20/2023   Acute gastric ulcer with hemorrhage 12/20/2023   Angiectasia of gastrointestinal tract 12/14/2023   Intractable vomiting 12/13/2023   Mixed hyperlipidemia 12/13/2023   GI bleed 10/27/2023   Hematemesis 10/26/2023   Gastritis and gastroduodenitis 10/26/2023   Primary osteoarthritis of left knee 06/16/2020  Effusion, left knee 06/16/2020   Loose body in knee, left knee 06/16/2020   Vitamin D  deficiency 03/24/2020   Hyperlipidemia 03/24/2020   Pain in left foot 01/21/2020   Primary osteoarthritis of right knee 08/06/2019   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   Other meniscus derangements, posterior  horn of medial meniscus, left knee 02/19/2019   Bucket handle tear of lateral meniscus 02/19/2019   Unilateral primary osteoarthritis, right knee 12/13/2018   Panic disorder 12/13/2018   Chronic headaches 12/13/2018   Weakness 09/06/2017   Depression 09/06/2017   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   Generalized anxiety disorder with panic attacks 09/06/2017   Right hand pain    Wrist swelling    Cellulitis of right upper extremity 04/11/2016   Elbow pain 07/20/2015   Tachycardia 07/04/2015   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HCC)    Skin ulcer of upper arm, limited to breakdown of skin (HCC) 07/01/2015    Allergies:  Allergies  Allergen Reactions   Droperidol Palpitations and Other (See Comments)    Elevates BP increases HR   Ondansetron  Hives, Swelling and Rash    Spots around IV site IV Zofran    Benzonatate  Rash and Other (See Comments)    Caused burning pain in eyes  Watery eyes  Vision changes  Conjunctivitis   Bromfed Dm [Pseudoeph-Bromphen-Dm] Anxiety   Diclofenac  Sodium Rash   Flexeril [Cyclobenzaprine] Anxiety   Medications:  Current Outpatient Medications:    acetaminophen  (TYLENOL ) 325 MG tablet, Take 325-650 mg by mouth every 6 (six) hours as needed for mild pain (pain score 1-3)., Disp: , Rfl:    butalbital -acetaminophen -caffeine  (FIORICET ) 50-325-40 MG tablet, Take 1 tablet by mouth daily as needed for headache or migraine., Disp: , Rfl:    diazepam  (VALIUM ) 10 MG tablet, Take 1 tablet (10 mg total) by mouth 3 (three) times daily as needed for anxiety., Disp: 90 tablet, Rfl: 5   DULoxetine  (CYMBALTA ) 60 MG capsule, Take 1 capsule (60 mg total) by mouth daily., Disp: 30 capsule, Rfl: 6   FERREX 150 150 MG capsule, Take 150 mg by mouth daily., Disp: , Rfl:    gabapentin  (NEURONTIN ) 300 MG capsule, Take 2 capsules (600 mg total) by mouth 3 (three) times daily., Disp: 540 capsule, Rfl: 1   pantoprazole  (PROTONIX ) 40 MG tablet, Take 1 tablet (40 mg total) by mouth 2 (two) times  daily., Disp: 60 tablet, Rfl: 1   phentermine (ADIPEX-P) 37.5 MG tablet, Take 37.5 mg by mouth 3 (three) times a week., Disp: , Rfl:    rosuvastatin  (CRESTOR ) 20 MG tablet, Take 1 tablet by mouth daily., Disp: , Rfl:    sucralfate  (CARAFATE ) 1 g tablet, Take 1 tablet (1 g total) by mouth 4 (four) times daily., Disp: 120 tablet, Rfl: 0   tiZANidine  (ZANAFLEX ) 4 MG tablet, Take 4 mg by mouth at bedtime as needed for muscle spasms., Disp: , Rfl:   Observations/Objective: Patient is well-developed, well-nourished in no acute distress.  Resting comfortably  at home.  Head is normocephalic, atraumatic.  No labored breathing.  Speech is clear and coherent with logical content.  Patient is alert and oriented at baseline.    Assessment and Plan: 1. Chronic cough (Primary)   Patients present symptoms suspicious for chronic cough vs bronchitis. She has already been prescribed oral steroids which se started today. Differentials include allergic rhinitis,  bacterial pneumonia, sinusitis. Do not suspect underlying cardiopulmonary process. I considered, but think unlikely, dangerous causes of this patient's symptoms to include ACS, CHF or COPD  exacerbations, pneumonia, pneumothorax. Patient is nontoxic appearing and not in need of emergent medical intervention.  Follow Up Instructions: I discussed the assessment and treatment plan with the patient. The patient was provided an opportunity to ask questions and all were answered. The patient agreed with the plan and demonstrated an understanding of the instructions.  A copy of instructions were sent to the patient via MyChart unless otherwise noted below.    The patient was advised to call back or seek an in-person evaluation if the symptoms worsen or if the condition fails to improve as anticipated.    Sabrina Shuck, PA-C

## 2024-03-11 ENCOUNTER — Other Ambulatory Visit: Payer: Self-pay

## 2024-03-11 ENCOUNTER — Inpatient Hospital Stay (HOSPITAL_COMMUNITY)
Admission: EM | Admit: 2024-03-11 | Discharge: 2024-03-14 | DRG: 369 | Disposition: A | Discharge status: Home / Self Care | Attending: Internal Medicine | Admitting: Internal Medicine

## 2024-03-11 ENCOUNTER — Emergency Department (HOSPITAL_COMMUNITY)

## 2024-03-11 ENCOUNTER — Encounter (HOSPITAL_COMMUNITY): Payer: Self-pay

## 2024-03-11 DIAGNOSIS — Z6830 Body mass index (BMI) 30.0-30.9, adult: Secondary | ICD-10-CM

## 2024-03-11 DIAGNOSIS — Z9049 Acquired absence of other specified parts of digestive tract: Secondary | ICD-10-CM

## 2024-03-11 DIAGNOSIS — F411 Generalized anxiety disorder: Secondary | ICD-10-CM | POA: Diagnosis not present

## 2024-03-11 DIAGNOSIS — Z8041 Family history of malignant neoplasm of ovary: Secondary | ICD-10-CM

## 2024-03-11 DIAGNOSIS — E669 Obesity, unspecified: Secondary | ICD-10-CM | POA: Diagnosis present

## 2024-03-11 DIAGNOSIS — K922 Gastrointestinal hemorrhage, unspecified: Secondary | ICD-10-CM | POA: Diagnosis not present

## 2024-03-11 DIAGNOSIS — G894 Chronic pain syndrome: Secondary | ICD-10-CM | POA: Diagnosis present

## 2024-03-11 DIAGNOSIS — Z560 Unemployment, unspecified: Secondary | ICD-10-CM

## 2024-03-11 DIAGNOSIS — Z90710 Acquired absence of both cervix and uterus: Secondary | ICD-10-CM

## 2024-03-11 DIAGNOSIS — R1030 Lower abdominal pain, unspecified: Secondary | ICD-10-CM | POA: Diagnosis not present

## 2024-03-11 DIAGNOSIS — K921 Melena: Secondary | ICD-10-CM

## 2024-03-11 DIAGNOSIS — Z800 Family history of malignant neoplasm of digestive organs: Secondary | ICD-10-CM

## 2024-03-11 DIAGNOSIS — F4310 Post-traumatic stress disorder, unspecified: Secondary | ICD-10-CM | POA: Diagnosis present

## 2024-03-11 DIAGNOSIS — L120 Bullous pemphigoid: Secondary | ICD-10-CM | POA: Diagnosis present

## 2024-03-11 DIAGNOSIS — Z79899 Other long term (current) drug therapy: Secondary | ICD-10-CM

## 2024-03-11 DIAGNOSIS — K2970 Gastritis, unspecified, without bleeding: Secondary | ICD-10-CM | POA: Diagnosis present

## 2024-03-11 DIAGNOSIS — Z820 Family history of epilepsy and other diseases of the nervous system: Secondary | ICD-10-CM

## 2024-03-11 DIAGNOSIS — E042 Nontoxic multinodular goiter: Secondary | ICD-10-CM | POA: Diagnosis present

## 2024-03-11 DIAGNOSIS — E2749 Other adrenocortical insufficiency: Secondary | ICD-10-CM

## 2024-03-11 DIAGNOSIS — K449 Diaphragmatic hernia without obstruction or gangrene: Secondary | ICD-10-CM | POA: Diagnosis present

## 2024-03-11 DIAGNOSIS — K254 Chronic or unspecified gastric ulcer with hemorrhage: Secondary | ICD-10-CM | POA: Diagnosis present

## 2024-03-11 DIAGNOSIS — R109 Unspecified abdominal pain: Secondary | ICD-10-CM | POA: Diagnosis present

## 2024-03-11 DIAGNOSIS — Z888 Allergy status to other drugs, medicaments and biological substances status: Secondary | ICD-10-CM

## 2024-03-11 DIAGNOSIS — F32A Depression, unspecified: Secondary | ICD-10-CM | POA: Diagnosis present

## 2024-03-11 DIAGNOSIS — M170 Bilateral primary osteoarthritis of knee: Secondary | ICD-10-CM | POA: Diagnosis present

## 2024-03-11 DIAGNOSIS — K920 Hematemesis: Secondary | ICD-10-CM | POA: Diagnosis present

## 2024-03-11 DIAGNOSIS — F410 Panic disorder [episodic paroxysmal anxiety] without agoraphobia: Secondary | ICD-10-CM | POA: Diagnosis present

## 2024-03-11 DIAGNOSIS — Z8042 Family history of malignant neoplasm of prostate: Secondary | ICD-10-CM

## 2024-03-11 DIAGNOSIS — K226 Gastro-esophageal laceration-hemorrhage syndrome: Principal | ICD-10-CM | POA: Diagnosis present

## 2024-03-11 DIAGNOSIS — Z5986 Financial insecurity: Secondary | ICD-10-CM

## 2024-03-11 DIAGNOSIS — Z5941 Food insecurity: Secondary | ICD-10-CM

## 2024-03-11 HISTORY — DX: Peptic ulcer, site unspecified, unspecified as acute or chronic, without hemorrhage or perforation: K27.9

## 2024-03-11 HISTORY — DX: Diaphragmatic hernia without obstruction or gangrene: K44.9

## 2024-03-11 LAB — HEPATIC FUNCTION PANEL
ALT: 20 U/L (ref 0–44)
AST: 25 U/L (ref 15–41)
Albumin: 4.2 g/dL (ref 3.5–5.0)
Alkaline Phosphatase: 92 U/L (ref 38–126)
Bilirubin, Direct: 0.1 mg/dL (ref 0.0–0.2)
Indirect Bilirubin: 0.2 mg/dL — ABNORMAL LOW (ref 0.3–0.9)
Total Bilirubin: 0.3 mg/dL (ref 0.0–1.2)
Total Protein: 7.3 g/dL (ref 6.5–8.1)

## 2024-03-11 LAB — BASIC METABOLIC PANEL WITH GFR
Anion gap: 8 (ref 5–15)
BUN: 15 mg/dL (ref 6–20)
CO2: 25 mmol/L (ref 22–32)
Calcium: 9.2 mg/dL (ref 8.9–10.3)
Chloride: 106 mmol/L (ref 98–111)
Creatinine, Ser: 0.89 mg/dL (ref 0.44–1.00)
GFR, Estimated: >60 mL/min (ref >60)
Glucose, Bld: 91 mg/dL (ref 70–99)
Potassium: 3.8 mmol/L (ref 3.5–5.1)
Sodium: 139 mmol/L (ref 135–145)

## 2024-03-11 LAB — CBC WITH DIFFERENTIAL/PLATELET
Abs Immature Granulocytes: 0.01 10*3/uL (ref 0.00–0.07)
Basophils Absolute: 0 10*3/uL (ref 0.0–0.1)
Basophils Relative: 0 %
Eosinophils Absolute: 0.1 10*3/uL (ref 0.0–0.5)
Eosinophils Relative: 3 %
HCT: 34.8 % — ABNORMAL LOW (ref 36.0–46.0)
Hemoglobin: 11.3 g/dL — ABNORMAL LOW (ref 12.0–15.0)
Immature Granulocytes: 0 %
Lymphocytes Relative: 32 %
Lymphs Abs: 1.4 10*3/uL (ref 0.7–4.0)
MCH: 30.6 pg (ref 26.0–34.0)
MCHC: 32.5 g/dL (ref 30.0–36.0)
MCV: 94.3 fL (ref 80.0–100.0)
Monocytes Absolute: 0.6 10*3/uL (ref 0.1–1.0)
Monocytes Relative: 13 %
Neutro Abs: 2.3 10*3/uL (ref 1.7–7.7)
Neutrophils Relative %: 52 %
Platelets: 223 10*3/uL (ref 150–400)
RBC: 3.69 MIL/uL — ABNORMAL LOW (ref 3.87–5.11)
RDW: 14.3 % (ref 11.5–15.5)
WBC: 4.4 10*3/uL (ref 4.0–10.5)
nRBC: 0 % (ref 0.0–0.2)

## 2024-03-11 LAB — URINALYSIS, ROUTINE W REFLEX MICROSCOPIC
Bacteria, UA: NONE SEEN
Bilirubin Urine: NEGATIVE
Glucose, UA: NEGATIVE mg/dL
Hgb urine dipstick: NEGATIVE
Ketones, ur: NEGATIVE mg/dL
Nitrite: NEGATIVE
Protein, ur: NEGATIVE mg/dL
Specific Gravity, Urine: 1.009 (ref 1.005–1.030)
pH: 7 (ref 5.0–8.0)

## 2024-03-11 LAB — PROTIME-INR
INR: 1 (ref 0.8–1.2)
Prothrombin Time: 13.4 s (ref 11.4–15.2)

## 2024-03-11 LAB — HEMOGLOBIN AND HEMATOCRIT, BLOOD
HCT: 37.9 % (ref 36.0–46.0)
Hemoglobin: 12.1 g/dL (ref 12.0–15.0)

## 2024-03-11 LAB — LACTIC ACID, PLASMA: Lactic Acid, Venous: 0.9 mmol/L (ref 0.5–1.9)

## 2024-03-11 LAB — LIPASE, BLOOD: Lipase: 33 U/L (ref 11–51)

## 2024-03-11 MED ORDER — LACTATED RINGERS IV SOLN: INTRAVENOUS | Status: DC

## 2024-03-11 MED ORDER — DIAZEPAM 5 MG PO TABS
10.0000 mg | ORAL_TABLET | Freq: Three times a day (TID) | ORAL | Status: DC | PRN
  Administered 2024-03-13: 10 mg via ORAL
  Filled 2024-03-11: qty 2

## 2024-03-11 MED ORDER — ACETAMINOPHEN 650 MG RE SUPP
650.0000 mg | Freq: Four times a day (QID) | RECTAL | Status: DC | PRN
Start: 2024-03-11 — End: 2024-03-14

## 2024-03-11 MED ORDER — POLYETHYLENE GLYCOL 3350 17 G PO PACK: 17.0000 g | PACK | Freq: Every day | ORAL | Status: DC | PRN

## 2024-03-11 MED ORDER — GABAPENTIN 300 MG PO CAPS
600.0000 mg | ORAL_CAPSULE | Freq: Three times a day (TID) | ORAL | Status: DC
  Administered 2024-03-11 – 2024-03-14 (×8): 600 mg via ORAL
  Filled 2024-03-11 (×8): qty 2

## 2024-03-11 MED ORDER — SODIUM CHLORIDE 0.9 % IV SOLN
12.5000 mg | Freq: Once | INTRAVENOUS | Status: AC
  Administered 2024-03-11: 12.5 mg via INTRAVENOUS
  Filled 2024-03-11: qty 0.5

## 2024-03-11 MED ORDER — LACTATED RINGERS IV SOLN: INTRAVENOUS | Status: AC

## 2024-03-11 MED ORDER — IOHEXOL 350 MG/ML SOLN
100.0000 mL | Freq: Once | INTRAVENOUS | Status: AC | PRN
  Administered 2024-03-11: 100 mL via INTRAVENOUS

## 2024-03-11 MED ORDER — SODIUM CHLORIDE 0.9 % IV SOLN
12.5000 mg | Freq: Four times a day (QID) | INTRAVENOUS | Status: DC | PRN
  Administered 2024-03-12 – 2024-03-13 (×7): 12.5 mg via INTRAVENOUS
  Filled 2024-03-11 (×3): qty 0.5

## 2024-03-11 MED ORDER — ACETAMINOPHEN 325 MG PO TABS
650.0000 mg | ORAL_TABLET | Freq: Four times a day (QID) | ORAL | Status: DC | PRN
  Administered 2024-03-13 (×2): 650 mg via ORAL
  Filled 2024-03-11 (×2): qty 2

## 2024-03-11 MED ORDER — HYDROMORPHONE HCL 1 MG/ML IJ SOLN: 1.0000 mg | INTRAMUSCULAR | Status: DC | PRN

## 2024-03-11 MED ORDER — SUCRALFATE 1 G PO TABS
1.0000 g | ORAL_TABLET | Freq: Four times a day (QID) | ORAL | Status: DC
  Administered 2024-03-11 – 2024-03-14 (×10): 1 g via ORAL
  Filled 2024-03-11 (×10): qty 1

## 2024-03-11 MED ORDER — HYDROMORPHONE HCL 1 MG/ML IJ SOLN
0.5000 mg | INTRAMUSCULAR | Status: DC | PRN
  Administered 2024-03-12 – 2024-03-14 (×13): 0.5 mg via INTRAVENOUS
  Filled 2024-03-11 (×14): qty 0.5

## 2024-03-11 MED ORDER — HYDROMORPHONE HCL 1 MG/ML IJ SOLN
1.0000 mg | Freq: Once | INTRAMUSCULAR | Status: AC
  Administered 2024-03-11: 1 mg via INTRAVENOUS
  Filled 2024-03-11: qty 1

## 2024-03-11 MED ORDER — HYDROMORPHONE HCL 1 MG/ML IJ SOLN
0.5000 mg | Freq: Once | INTRAMUSCULAR | Status: AC
  Administered 2024-03-11: 0.5 mg via INTRAVENOUS
  Filled 2024-03-11: qty 0.5

## 2024-03-11 MED ORDER — PANTOPRAZOLE SODIUM 40 MG IV SOLR
40.0000 mg | Freq: Two times a day (BID) | INTRAVENOUS | Status: DC
  Administered 2024-03-12 – 2024-03-14 (×5): 40 mg via INTRAVENOUS
  Filled 2024-03-11 (×5): qty 10

## 2024-03-11 MED ORDER — PANTOPRAZOLE SODIUM 40 MG IV SOLR
80.0000 mg | Freq: Once | INTRAVENOUS | Status: AC
  Administered 2024-03-11: 80 mg via INTRAVENOUS
  Filled 2024-03-11: qty 20

## 2024-03-11 NOTE — ED Notes (Signed)
 Patient transported to CT

## 2024-03-11 NOTE — Assessment & Plan Note (Addendum)
 Presenting with hematemesis and hematochezia, for severe upper abdominal pain.  Denies melena.  Hemoglobin stable at 11.3. Mild tachycardia to 108, resolved.  His NSAID use.  Has had several EGDs since January of this year in our system-2/13, 4/3, 4/8, 5/8.  Also additional EGDs 5/12 and 6/27-at [facility]. - Last EGD here- 5/8 by Dr. Gust gastric ulcers with clean base, gastritis.   - Most recent EGD [facility] 6/27-no evidence of bleeding, esophageal mucosa normal, mild erythema in gastric antrum.  Multiple biopsies done to rule out H. pylori. - CT angio today no findings to localize reported GI bleed. -EDP talked to Dr. Shaaron, will see in consult in AM. -Clear liquid diet, n.p.o. midnight -Continue LR 75cc/hr x 15hrs

## 2024-03-11 NOTE — ED Triage Notes (Signed)
 Pt arrived via POV from home c/o N/V/D and reports seeing blood in both emesis and stool. Pt endorses severe abdominal pain as well. Pt reports being seen recently at [facility] ED for same. Pt reports this problem has been on-going since last Thursday.

## 2024-03-11 NOTE — ED Provider Notes (Signed)
 [place] EMERGENCY DEPARTMENT AT [facility] Provider Note   CSN: [id number] Arrival date & time: 03/11/24  1512     Patient presents with: Hematemesis   Sabrina [name] is a 58 y.o. female.   58 year old female presents for evaluation of hematemesis.  She states she has had some vomiting blood the last couple days.  She states a few days ago she was at outside ER and she was with vomiting specks at that time and had negative blood work.  She states that today she started vomiting large amounts of blood.  States she has a history of ulcers but is never vomited as much blood before.  States she is also having profuse bloody stools and passing blood clots as well.  She admits to increased abdominal pain from her usual.  States she always has some epigastric pain but seems worse.  States has been compliant with her Carafate  and Protonix .  Last EGD was a week ago but she was only vomiting specks at that time.        Prior to Admission medications   Medication Sig Start Date End Date Taking? Authorizing Provider  acetaminophen  (TYLENOL ) 325 MG tablet Take 325-650 mg by mouth every 6 (six) hours as needed for mild pain (pain score 1-3).   Yes [provider]  butalbital -apap-caffeine -codeine  (FIORICET  WITH CODEINE ) 50-325-40-30 MG capsule Take 1 capsule by mouth daily as needed for headache or migraine. 02/09/24  Yes [provider]  diazepam  (VALIUM ) 10 MG tablet Take 1 tablet (10 mg total) by mouth 3 (three) times daily as needed for anxiety. Patient taking differently: Take 10 mg by mouth every 8 (eight) hours as needed for anxiety. 03/05/24  Yes Plovsky, Elna, MD  dicyclomine  (BENTYL ) 20 MG tablet Take 20 mg by mouth daily as needed for spasms. 03/09/24 03/19/24 Yes [provider]  FERREX 150 150 MG capsule Take 150 mg by mouth daily.   Yes [provider]  gabapentin  (NEURONTIN ) 300 MG capsule Take 2 capsules (600 mg total) by mouth 3 (three)  times daily. 08/10/21  Yes Theophilus Andrews, Tully GRADE, MD  naloxone [facility]) nasal spray 4 mg/0.1 mL Place 1 spray into the nose once. 01/24/24  Yes [provider]  pantoprazole  (PROTONIX ) 40 MG tablet Take 1 tablet (40 mg total) by mouth 2 (two) times daily. 12/19/23 03/11/24 Yes Shahmehdi, Seyed A, MD  rosuvastatin  (CRESTOR ) 20 MG tablet Take 1 tablet by mouth daily. 06/08/22  Yes [provider]  sucralfate  (CARAFATE ) 1 g tablet Take 1 tablet (1 g total) by mouth 4 (four) times daily. 01/19/24 03/11/24 Yes Johnson, Clanford L, MD  DULoxetine  (CYMBALTA ) 60 MG capsule Take 1 capsule (60 mg total) by mouth daily. Patient not taking: Reported on 03/11/2024 03/05/24   Tasia Elna, MD    Allergies: Droperidol, Ondansetron , Benzonatate , Bromfed dm [pseudoeph-bromphen-dm], Diclofenac  sodium, and Flexeril [cyclobenzaprine]    Review of Systems  Constitutional:  Negative for chills and fever.  HENT:  Negative for ear pain and sore throat.   Eyes:  Negative for pain and visual disturbance.  Respiratory:  Negative for cough and shortness of breath.   Cardiovascular:  Negative for chest pain and palpitations.  Gastrointestinal:  Positive for abdominal pain, blood in stool, diarrhea and vomiting.       Admits hematemesis  Genitourinary:  Negative for dysuria and hematuria.  Musculoskeletal:  Negative for arthralgias and back pain.  Skin:  Negative for color change and rash.  Neurological:  Negative for seizures and syncope.  All other systems reviewed and are negative.   Updated Vital Signs BP (!) 124/93 Comment: told rn bp up  Pulse 90   Temp 98.3 F (36.8 C) (Oral)   Resp 20   Ht 5' 4 (1.626 m)   Wt 81.2 kg   SpO2 100%   BMI 30.74 kg/m   Physical Exam Vitals and nursing note reviewed.  Constitutional:      General: She is not in acute distress.    Appearance: She is well-developed. She is not ill-appearing.  HENT:     Head: Normocephalic and atraumatic.   Eyes:      Conjunctiva/sclera: Conjunctivae normal.    Cardiovascular:     Rate and Rhythm: Normal rate and regular rhythm.     Heart sounds: No murmur heard. Pulmonary:     Effort: Pulmonary effort is normal. No respiratory distress.     Breath sounds: Normal breath sounds.  Abdominal:     Palpations: Abdomen is soft.     Tenderness: There is abdominal tenderness.     Comments: Diffuse abdominal tenderness to palpation, worse in the epigastric area   Musculoskeletal:        General: No swelling.     Cervical back: Neck supple.   Skin:    General: Skin is warm and dry.     Capillary Refill: Capillary refill takes less than 2 seconds.   Neurological:     Mental Status: She is alert.   Psychiatric:        Mood and Affect: Mood normal.     (all labs ordered are listed, but only abnormal results are displayed) Labs Reviewed  CBC WITH DIFFERENTIAL/PLATELET - Abnormal; Notable for the following components:      Result Value   RBC 3.69 (*)    Hemoglobin 11.3 (*)    HCT 34.8 (*)    All other components within normal limits  HEPATIC FUNCTION PANEL - Abnormal; Notable for the following components:   Indirect Bilirubin 0.2 (*)    All other components within normal limits  URINALYSIS, ROUTINE W REFLEX MICROSCOPIC - Abnormal; Notable for the following components:   Color, Urine STRAW (*)    Leukocytes,Ua SMALL (*)    All other components within normal limits  LIPASE, BLOOD  LACTIC ACID, PLASMA  PROTIME-INR  BASIC METABOLIC PANEL WITH GFR  HEMOGLOBIN AND HEMATOCRIT, BLOOD  CBC  BASIC METABOLIC PANEL WITH GFR    EKG: None  Radiology: CT Angio Abd/Pel W and/or Wo Contrast Result Date: 03/11/2024 CLINICAL DATA:  Nausea, vomiting, diarrhea, severe abdominal pain, blood in both emesis and stool EXAM: CTA ABDOMEN AND PELVIS WITHOUT AND WITH CONTRAST TECHNIQUE: Multidetector CT imaging of the abdomen and pelvis was performed using the standard protocol during bolus administration of  intravenous contrast. Multiplanar reconstructed images and MIPs were obtained and reviewed to evaluate the vascular anatomy. RADIATION DOSE REDUCTION: This exam was performed according to the departmental dose-optimization program which includes automated exposure control, adjustment of the mA and/or kV according to patient size and/or use of iterative reconstruction technique. CONTRAST:  OMNIPAQUE  IOHEXOL  350 MG/ML SOLN COMPARISON:  CT abdomen pelvis, 03/09/2024, CT angiogram abdomen pelvis, 01/17/2024 FINDINGS: VASCULAR Normal contour and caliber of the abdominal aorta. No evidence of aneurysm, dissection, or other acute aortic pathology. Standard branching pattern of the abdominal aorta with solitary bilateral renal arteries. Review of the MIP images confirms the above findings. NON-VASCULAR Lower Chest: No acute findings. Hepatobiliary: No solid  liver abnormality is seen. Numerous benign liver cysts, requiring no further follow-up or characterization. Status post cholecystectomy. Unchanged mild postoperative biliary dilatation. Pancreas: Unremarkable. No pancreatic ductal dilatation or surrounding inflammatory changes. Spleen: Normal in size without significant abnormality. Adrenals/Urinary Tract: Adrenal glands are unremarkable. Kidneys are normal, without renal calculi, solid lesion, or hydronephrosis. Bladder is unremarkable. Stomach/Bowel: Stomach is within normal limits. Appendectomy. No evidence of bowel wall thickening, distention, or inflammatory changes. Sigmoid diverticulosis. Moderate burden of dense stool throughout the colon and rectum. Lymphatic: No enlarged abdominal or pelvic lymph nodes. Reproductive: Status post hysterectomy. Other: No abdominal wall hernia or abnormality. No ascites. Musculoskeletal: No acute osseous findings. IMPRESSION: 1. No intraluminal contrast extravasation or other findings to localize reported GI bleeding. 2. Normal contour and caliber of the abdominal aorta. No  evidence of aneurysm, dissection, or other acute aortic pathology. No significant atherosclerosis. 3. Sigmoid diverticulosis without evidence of acute diverticulitis. 4. Moderate burden of dense stool throughout the colon and rectum. 5. Status post cholecystectomy, appendectomy, and hysterectomy. Electronically Signed   By: Marolyn JONETTA Jaksch M.D.   On: 03/11/2024 18:33     Procedures   Medications Ordered in the ED  diazepam  (VALIUM ) tablet 10 mg (has no administration in time range)  sucralfate  (CARAFATE ) tablet 1 g (1 g Oral Given 03/11/24 2135)  gabapentin  (NEURONTIN ) capsule 600 mg (600 mg Oral Given 03/11/24 2135)  acetaminophen  (TYLENOL ) tablet 650 mg (has no administration in time range)    Or  acetaminophen  (TYLENOL ) suppository 650 mg (has no administration in time range)  polyethylene glycol (MIRALAX  / GLYCOLAX ) packet 17 g (has no administration in time range)  promethazine  (PHENERGAN ) 12.5 mg in sodium chloride  0.9 % 50 mL IVPB (has no administration in time range)  HYDROmorphone  (DILAUDID ) injection 0.5 mg (has no administration in time range)  lactated ringers  infusion ( Intravenous Restarted 03/11/24 2115)  pantoprazole  (PROTONIX ) injection 40 mg (has no administration in time range)  pantoprazole  (PROTONIX ) injection 80 mg (80 mg Intravenous Given 03/11/24 1629)  HYDROmorphone  (DILAUDID ) injection 0.5 mg (0.5 mg Intravenous Given 03/11/24 1733)  promethazine  (PHENERGAN ) 12.5 mg in sodium chloride  0.9 % 50 mL IVPB (0 mg Intravenous Stopped 03/11/24 1754)  HYDROmorphone  (DILAUDID ) injection 1 mg (1 mg Intravenous Given 03/11/24 1808)  iohexol  (OMNIPAQUE ) 350 MG/ML injection 100 mL (100 mLs Intravenous Contrast Given 03/11/24 1813)                                    Medical Decision Making Medical Decision Making Nursing notes are reviewed. Differential diagnosis for this patient would include but not limited to: Upper GI bleed, lower GI bleed, acute blood loss anemia, gastritis,  other  Records reviewed: Prior ED hospital records reviewed-patient was seen 2 days ago in outside ER and hemoglobin was 11.6.  She had an otherwise normal lab workup   Consults: Dr. Shaaron - GI -I spoke with her on the phone regarding the patient.  He recommended she be n.p.o. to be on maintenance fluids and they will plan to consult on the patient see her tomorrow and decide if she needs an endoscopy  Studies: CT angiogram of the abdomen and pelvis was reviewed and shows no evidence of acute bleeding or extravasation of contrast  Emergency Department Course:  Vital signs and pulse oximetry are reviewed, evaluated by myself and found to be within normal limits prior to final disposition. Findings of laboratory testing and  medical imaging are discussed with patient and family that is available. Patient agrees with the medical care plan as follows:  Patient's lab workup reviewed by me and hemoglobin is stable.  Labs otherwise fairly unremarkable.  Her vitals are stable except for some mild tachycardia.  She was given 80 mg of IV Protonix  as well as pain meds and fluids.  Discussed with GI as above.  Patient had no episodes of hematemesis or rectal bleeding here.  Discussed with hospitalist and patient be admitted for further workup and management.  Patient is agreeable with the plan.  All results were discussed with her.   Problems Addressed: Lower GI bleed: undiagnosed new problem with uncertain prognosis Upper GI bleed: undiagnosed new problem with uncertain prognosis  Amount and/or Complexity of Data Reviewed External Data Reviewed: notes. Labs: ordered. Decision-making details documented in ED Course. Radiology: ordered and independent interpretation performed. Decision-making details documented in ED Course.  Risk Prescription drug management. Parenteral controlled substances. Drug therapy requiring intensive monitoring for toxicity. Decision regarding hospitalization.    Final  diagnoses:  Upper GI bleed  Lower GI bleed    ED Discharge Orders     None          Gennaro Duwaine CROME, DO 03/11/24 2153

## 2024-03-11 NOTE — Assessment & Plan Note (Signed)
 Chronic lower abdominal pain. -Resume gabapentin 

## 2024-03-11 NOTE — H&P (Signed)
 History and Physical    Sabrina [name] FMW:[id number] DOB: Dec 25, 1965 DOA: 03/11/2024  PCP: [name], MD   Patient coming from: Home  I have personally briefly reviewed patient's old medical records in [facility]  Chief Complaint: Vomiting blood, blood in stools  HPI: Sabrina [name] is a 58 y.o. female with medical history significant for depression PTSD, GI ulcers, bullous pemphigoid, chronic abdominal pain. Patient presented to the ED with complaints of vomiting blood, with blood in stools over the past 2 days.  She reports severe upper abdominal pain today.  She reports she saw blood when she vomited 4-5 times a day.  She reports more blood than she is seen with prior episodes.  She also reports significant amount of blood in stool, with blood clots today.  She reports normal brown stool.  Reports intermittent episodes of dizziness today, when standing and bending over.  She denies NSAID use.  He has chronic lower abdominal pain. Patient had an EGD a week ago with [facility].  She was at [facility] yesterday with same symptoms.  Recent hospitalization 5/7- 5/9-also with concerns for GI bleed, had CT angio negative for evidence of active GI bleed.  Hemoglobin was stable at 12.  Had EGD with findings of nonbleeding gastric ulcers, gastritis.  Patient is a surgical nurse, works in a [employer].  ED Course: Temperature 98.2.  Heart rate 77- 108. Blood pressure 111-128.  Hemoglobin 11.3. CTA abdomen pelvis -no acute abnormality, shows sigmoid diverticulosis without evidence of acute diverticulitis, and dense stool burden throughout the colon and rectum. IV Dilaudid  0.5 and then 1 mg given in ED.  IV Protonix  80 mg x 1.  EDP talked to Dr. Shaaron, will see in consult in AM.  Review of Systems: As per HPI all other systems reviewed and negative.  Past Medical History:  Diagnosis Date   Arthritis    knees (09/06/2017)   Bullous pemphigus    Cat bite  06/2014   to left elbow   Hiatal hernia    History of blood transfusion 1988   when I had my baby   Muscle weakness of lower extremity 2001; 09/05/2017   resolved after a couple weeks; ? (09/06/2017)   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HCC)    Poisoning, snake bite 04/08/2016   [place]; RUE   PONV (postoperative nausea and vomiting)    PUD (peptic ulcer disease)    S/P right knee surgery    Situational anxiety    Staph infection ~ 2015   left elbow and finger   Thyroid  mass     Past Surgical History:  Procedure Laterality Date   APPENDECTOMY  ~ 1987   APPLICATION OF A-CELL OF EXTREMITY Left 08/05/2015   Procedure: APPLICATION OF A-CELL OF EXTREMITY;  Surgeon: Estefana GORMAN Fritter, DO;  Location: [facility];  Service: Plastics;  Laterality: Left;   BIOPSY  10/26/2023   Procedure: BIOPSY;  Surgeon: [name], MD;  [facility];  Service: Endoscopy;;   BREAST SURGERY Right 1990   milk duct taken out   CHONDROPLASTY Right 02/19/2019   Procedure: CHONDROPLASTY; EXCISION EXOSTOSIS;  Surgeon: Anderson Maude ORN, MD;  Location: [facility];  Service: Orthopedics;  Laterality: Right;   COLONOSCOPY N/A 12/22/2023   Procedure: COLONOSCOPY;  Surgeon: [name], MD;  Location: [facility];  Service: Endoscopy;  Laterality: N/A;   DEBRIDEMENT AND CLOSURE WOUND Left 07/01/2015   Procedure: LEFT ELBOW  EXCISION OF WOUND WITH PRIMARY CLOSURE 2X5 CM ;  Surgeon: Estefana GORMAN Fritter, DO;  Location: [facility];  Service: Plastics;  Laterality: Left;   ELBOW SURGERY Left X 23 in [place]  <06/2015   from a cat bite; all I&D   ESOPHAGOGASTRODUODENOSCOPY N/A 12/14/2023   Procedure: EGD (ESOPHAGOGASTRODUODENOSCOPY);  Surgeon: [name], MD;  [facility];  Service: Endoscopy;  Laterality: N/A;   ESOPHAGOGASTRODUODENOSCOPY N/A 12/19/2023   Procedure: EGD (ESOPHAGOGASTRODUODENOSCOPY);  Surgeon: Eartha Flavors, Toribio,  MD;  [facility];  Service: Gastroenterology;  Laterality: N/A;   ESOPHAGOGASTRODUODENOSCOPY N/A 01/18/2024   Procedure: EGD (ESOPHAGOGASTRODUODENOSCOPY);  Surgeon: Cindie Carlin POUR, DO;  [facility];  Service: Endoscopy;  Laterality: N/A;   ESOPHAGOGASTRODUODENOSCOPY (EGD) WITH PROPOFOL  N/A 10/26/2023   Procedure: ESOPHAGOGASTRODUODENOSCOPY (EGD) WITH PROPOFOL ;  Surgeon: [name], MD;  [facility];  Service: Endoscopy;  Laterality: N/A;   GIVENS CAPSULE STUDY N/A 12/22/2023   Procedure: IMAGING PROCEDURE, GI TRACT, INTRALUMINAL, VIA CAPSULE;  Surgeon: [name], MD;  [facility];  Service: Endoscopy;  Laterality: N/A;   HOT HEMOSTASIS  12/14/2023   Procedure: EGD, WITH ARGON PLASMA COAGULATION;  Surgeon: [name], MD;  [facility]: [facility];  Service: Endoscopy;;   I & D EXTREMITY Left 07/08/2015   Procedure: IRRIGATION AND DEBRIDEMENT EXTREMITY, DRAINAGE OF LEFT ARM WOUND, A-CELL PLACEMENT, WOUND VAC PLACEMENT;  Surgeon: Estefana GORMAN Dillingham, DO;  Location: [facility];  Service: Plastics;  Laterality: Left;   INCISION AND DRAINAGE OF WOUND Left 08/05/2015   Procedure: IRRIGATION AND DEBRIDEMENT LEFT ELBOW WOUND, PLACEMENT OF ACELL;  Surgeon: Estefana GORMAN Fritter, DO;  Location: [facility];  Service: Plastics;  Laterality: Left;   KNEE ARTHROSCOPY WITH MEDIAL MENISECTOMY Right 02/19/2019   Procedure: RIGHT KNEE ARTHROSCOPY, DEBRIDEMENT, PARTIAL MEDIAL AND LATERAL MENISECTOMY;  Surgeon: Anderson Maude ORN, MD;  Location:  SURGERY CENTER;  Service: Orthopedics;  Laterality: Right;   LAPAROSCOPIC CHOLECYSTECTOMY  1998   SKIN GRAFT Left 2016   took from anterior thigh; placed at elbow   TONSILLECTOMY  ~ 2000   TOTAL ABDOMINAL HYSTERECTOMY  2003   WRIST SURGERY Right 01/2016     reports that she has never smoked. She has never used smokeless tobacco. She reports that she does not currently use alcohol. She  reports that she does not use drugs.  Allergies  Allergen Reactions   Droperidol Palpitations and Other (See Comments)    Elevates BP increases HR   Ondansetron  Hives, Swelling and Rash    Spots around IV site IV Zofran    Benzonatate  Rash and Other (See Comments)    Caused burning pain in eyes  Watery eyes  Vision changes  Conjunctivitis   Bromfed Dm [Pseudoeph-Bromphen-Dm] Anxiety   Diclofenac  Sodium Rash   Flexeril [Cyclobenzaprine] Anxiety    Family History  Problem Relation Age of Onset   Liver disease Mother    Dementia Mother    Cirrhosis Mother    Prostate cancer Father    Colon cancer Maternal Grandmother    Ovarian cancer Maternal Aunt    Prior to Admission medications   Medication Sig Start Date End Date Taking? Authorizing Provider  dicyclomine  (BENTYL ) 20 MG tablet Take 20 mg by mouth. 03/09/24 03/19/24 Yes [provider]  promethazine -dextromethorphan  (PROMETHAZINE -DM) 6.25-15 MG/5ML syrup Take 5 mLs by mouth. 03/06/24  Yes [provider]  acetaminophen  (TYLENOL ) 325 MG tablet Take 325-650 mg by mouth every  6 (six) hours as needed for mild pain (pain score 1-3).    [provider]  butalbital -acetaminophen -caffeine  (FIORICET ) 50-325-40 MG tablet Take 1 tablet by mouth daily as needed for headache or migraine. 11/19/23   [provider]  diazepam  (VALIUM ) 10 MG tablet Take 1 tablet (10 mg total) by mouth 3 (three) times daily as needed for anxiety. 03/05/24   Plovsky, Elna, MD  DULoxetine  (CYMBALTA ) 60 MG capsule Take 1 capsule (60 mg total) by mouth daily. 03/05/24   Plovsky, Elna, MD  FERREX 150 150 MG capsule Take 150 mg by mouth daily.    [provider]  gabapentin  (NEURONTIN ) 300 MG capsule Take 2 capsules (600 mg total) by mouth 3 (three) times daily. 08/10/21   Theophilus Andrews, Tully GRADE, MD  pantoprazole  (PROTONIX ) 40 MG tablet Take 1 tablet (40 mg total) by mouth 2 (two) times daily. 12/19/23 03/05/24  Shahmehdi,  Seyed A, MD  phentermine (ADIPEX-P) 37.5 MG tablet Take 37.5 mg by mouth 3 (three) times a week. 12/07/23   [provider]  rosuvastatin  (CRESTOR ) 20 MG tablet Take 1 tablet by mouth daily. 06/08/22   [provider]  sucralfate  (CARAFATE ) 1 g tablet Take 1 tablet (1 g total) by mouth 4 (four) times daily. 01/19/24 03/05/24  Johnson, Clanford L, MD  tiZANidine  (ZANAFLEX ) 4 MG tablet Take 4 mg by mouth at bedtime as needed for muscle spasms. 01/08/24   [provider]    Physical Exam: Vitals:   03/11/24 1745 03/11/24 1807 03/11/24 1848 03/11/24 1900  BP: (!) 144/100  111/85 (!) 118/99  Pulse: (!) 108 (!) 101 88 77  Resp:   16   Temp:      SpO2: 97% 98% 96% 90%  Weight:      Height:        Constitutional: NAD, calm, comfortable Vitals:   03/11/24 1745 03/11/24 1807 03/11/24 1848 03/11/24 1900  BP: (!) 144/100  111/85 (!) 118/99  Pulse: (!) 108 (!) 101 88 77  Resp:   16   Temp:      SpO2: 97% 98% 96% 90%  Weight:      Height:       Eyes: PERRL, lids and conjunctivae normal ENMT: Mucous membranes are moist.  Neck: normal, supple, no masses, no thyromegaly Respiratory: clear to auscultation bilaterally, no wheezing, no crackles.  Cardiovascular: Regular rate and rhythm, no murmurs / rubs / gallops. No extremity edema.  Extremities warm. Abdomen: no tenderness, no masses palpated. No hepatosplenomegaly. Musculoskeletal: no clubbing / cyanosis. No joint deformity upper and lower extremities. Good ROM, no contractures. Normal muscle tone.  Skin: no rashes, lesions, ulcers. No induration Neurologic: No facial asymmetry, extremity spontaneously, speech fluent Psychiatric: Normal judgment and insight. Alert and oriented x 3. Normal mood.   Labs on Admission: I have personally reviewed following labs and imaging studies  CBC: Recent Labs  Lab 03/11/24 1623  WBC 4.4  NEUTROABS 2.3  HGB 11.3*  HCT 34.8*  MCV 94.3  PLT 223   Basic Metabolic Panel: Recent  Labs  Lab 03/11/24 1623  NA 139  K 3.8  CL 106  CO2 25  GLUCOSE 91  BUN 15  CREATININE 0.89  CALCIUM  9.2   GFR: Estimated Creatinine Clearance: 72.2 mL/min (by C-G formula based on SCr of 0.89 mg/dL). Liver Function Tests: Recent Labs  Lab 03/11/24 1623  AST 25  ALT 20  ALKPHOS 92  BILITOT 0.3  PROT 7.3  ALBUMIN 4.2  Recent Labs  Lab 03/11/24 1623  LIPASE 33   Coagulation Profile: Recent Labs  Lab 03/11/24 1623  INR 1.0   Urine analysis:    Component Value Date/Time   COLORURINE STRAW (A) 03/11/2024 1900   APPEARANCEUR CLEAR 03/11/2024 1900   LABSPEC 1.009 03/11/2024 1900   PHURINE 7.0 03/11/2024 1900   GLUCOSEU NEGATIVE 03/11/2024 1900   HGBUR NEGATIVE 03/11/2024 1900   BILIRUBINUR NEGATIVE 03/11/2024 1900   KETONESUR NEGATIVE 03/11/2024 1900   PROTEINUR NEGATIVE 03/11/2024 1900   UROBILINOGEN 0.2 07/05/2015 0627   NITRITE NEGATIVE 03/11/2024 1900   LEUKOCYTESUR SMALL (A) 03/11/2024 1900    Radiological Exams on Admission: CT Angio Abd/Pel W and/or Wo Contrast Result Date: 03/11/2024 CLINICAL DATA:  Nausea, vomiting, diarrhea, severe abdominal pain, blood in both emesis and stool EXAM: CTA ABDOMEN AND PELVIS WITHOUT AND WITH CONTRAST TECHNIQUE: Multidetector CT imaging of the abdomen and pelvis was performed using the standard protocol during bolus administration of intravenous contrast. Multiplanar reconstructed images and MIPs were obtained and reviewed to evaluate the vascular anatomy. RADIATION DOSE REDUCTION: This exam was performed according to the departmental dose-optimization program which includes automated exposure control, adjustment of the mA and/or kV according to patient size and/or use of iterative reconstruction technique. CONTRAST:  OMNIPAQUE  IOHEXOL  350 MG/ML SOLN COMPARISON:  CT abdomen pelvis, 03/09/2024, CT angiogram abdomen pelvis, 01/17/2024 FINDINGS: VASCULAR Normal contour and caliber of the abdominal aorta. No evidence of  aneurysm, dissection, or other acute aortic pathology. Standard branching pattern of the abdominal aorta with solitary bilateral renal arteries. Review of the MIP images confirms the above findings. NON-VASCULAR Lower Chest: No acute findings. Hepatobiliary: No solid liver abnormality is seen. Numerous benign liver cysts, requiring no further follow-up or characterization. Status post cholecystectomy. Unchanged mild postoperative biliary dilatation. Pancreas: Unremarkable. No pancreatic ductal dilatation or surrounding inflammatory changes. Spleen: Normal in size without significant abnormality. Adrenals/Urinary Tract: Adrenal glands are unremarkable. Kidneys are normal, without renal calculi, solid lesion, or hydronephrosis. Bladder is unremarkable. Stomach/Bowel: Stomach is within normal limits. Appendectomy. No evidence of bowel wall thickening, distention, or inflammatory changes. Sigmoid diverticulosis. Moderate burden of dense stool throughout the colon and rectum. Lymphatic: No enlarged abdominal or pelvic lymph nodes. Reproductive: Status post hysterectomy. Other: No abdominal wall hernia or abnormality. No ascites. Musculoskeletal: No acute osseous findings. IMPRESSION: 1. No intraluminal contrast extravasation or other findings to localize reported GI bleeding. 2. Normal contour and caliber of the abdominal aorta. No evidence of aneurysm, dissection, or other acute aortic pathology. No significant atherosclerosis. 3. Sigmoid diverticulosis without evidence of acute diverticulitis. 4. Moderate burden of dense stool throughout the colon and rectum. 5. Status post cholecystectomy, appendectomy, and hysterectomy. Electronically Signed   By: Marolyn JONETTA Jaksch M.D.   On: 03/11/2024 18:33   EKG: none  Assessment/Plan Principal Problem:   GI bleed Active Problems:   Depression   Generalized anxiety disorder with panic attacks   Abdominal pain  Assessment and Plan: * GI bleed Presenting with hematemesis  and hematochezia, for severe upper abdominal pain.  Denies melena.  Hemoglobin stable at 11.3. Mild tachycardia to 108, resolved.  His NSAID use.  Has had several EGDs since January of this year in our system-2/13, 4/3, 4/8, 5/8.  Also additional EGDs 5/12 and 6/27-at [facility]. - Last EGD here- 5/8 by Dr. Gust gastric ulcers with clean base, gastritis.   - Most recent EGD [facility] 6/27-no evidence of bleeding, esophageal mucosa normal, mild erythema in gastric antrum.  Multiple  biopsies done to rule out H. pylori. - CT angio today no findings to localize reported GI bleed. -EDP talked to Dr. Shaaron, will see in consult in AM. -Clear liquid diet, n.p.o. midnight -Continue LR 75cc/hr x 15hrs  Abdominal pain Chronic lower abdominal pain. -Resume gabapentin   Generalized anxiety disorder with panic attacks Reports severe anxiety, PTSD. -Resume Valium  10 mg 3 times daily as needed   DVT prophylaxis: SCDs Code Status: FULL  Family Communication: None at bedside Disposition Plan: ~ 1- 2 days Consults called: GI Admission status: obs tele    Author: Tully FORBES Carwin, MD 03/11/2024 8:42 PM  For on call review [email or web address].

## 2024-03-11 NOTE — Assessment & Plan Note (Signed)
 Reports severe anxiety, PTSD. -Resume Valium  10 mg 3 times daily as needed

## 2024-03-12 ENCOUNTER — Observation Stay (HOSPITAL_COMMUNITY)

## 2024-03-12 DIAGNOSIS — K921 Melena: Secondary | ICD-10-CM | POA: Diagnosis not present

## 2024-03-12 DIAGNOSIS — K625 Hemorrhage of anus and rectum: Secondary | ICD-10-CM | POA: Diagnosis not present

## 2024-03-12 DIAGNOSIS — F410 Panic disorder [episodic paroxysmal anxiety] without agoraphobia: Secondary | ICD-10-CM | POA: Diagnosis present

## 2024-03-12 DIAGNOSIS — Z8041 Family history of malignant neoplasm of ovary: Secondary | ICD-10-CM | POA: Diagnosis not present

## 2024-03-12 DIAGNOSIS — K226 Gastro-esophageal laceration-hemorrhage syndrome: Secondary | ICD-10-CM | POA: Diagnosis present

## 2024-03-12 DIAGNOSIS — F32A Depression, unspecified: Secondary | ICD-10-CM | POA: Diagnosis present

## 2024-03-12 DIAGNOSIS — Z79899 Other long term (current) drug therapy: Secondary | ICD-10-CM | POA: Diagnosis not present

## 2024-03-12 DIAGNOSIS — Z90710 Acquired absence of both cervix and uterus: Secondary | ICD-10-CM | POA: Diagnosis not present

## 2024-03-12 DIAGNOSIS — E042 Nontoxic multinodular goiter: Secondary | ICD-10-CM | POA: Diagnosis present

## 2024-03-12 DIAGNOSIS — Z9049 Acquired absence of other specified parts of digestive tract: Secondary | ICD-10-CM | POA: Diagnosis not present

## 2024-03-12 DIAGNOSIS — G894 Chronic pain syndrome: Secondary | ICD-10-CM | POA: Diagnosis present

## 2024-03-12 DIAGNOSIS — K920 Hematemesis: Secondary | ICD-10-CM

## 2024-03-12 DIAGNOSIS — E669 Obesity, unspecified: Secondary | ICD-10-CM | POA: Diagnosis present

## 2024-03-12 DIAGNOSIS — Z800 Family history of malignant neoplasm of digestive organs: Secondary | ICD-10-CM | POA: Diagnosis not present

## 2024-03-12 DIAGNOSIS — Z6830 Body mass index (BMI) 30.0-30.9, adult: Secondary | ICD-10-CM | POA: Diagnosis not present

## 2024-03-12 DIAGNOSIS — F411 Generalized anxiety disorder: Secondary | ICD-10-CM | POA: Diagnosis present

## 2024-03-12 DIAGNOSIS — Z820 Family history of epilepsy and other diseases of the nervous system: Secondary | ICD-10-CM | POA: Diagnosis not present

## 2024-03-12 DIAGNOSIS — K254 Chronic or unspecified gastric ulcer with hemorrhage: Secondary | ICD-10-CM | POA: Diagnosis present

## 2024-03-12 DIAGNOSIS — R109 Unspecified abdominal pain: Secondary | ICD-10-CM

## 2024-03-12 DIAGNOSIS — Z5986 Financial insecurity: Secondary | ICD-10-CM | POA: Diagnosis not present

## 2024-03-12 DIAGNOSIS — K922 Gastrointestinal hemorrhage, unspecified: Secondary | ICD-10-CM | POA: Diagnosis not present

## 2024-03-12 DIAGNOSIS — M170 Bilateral primary osteoarthritis of knee: Secondary | ICD-10-CM | POA: Diagnosis present

## 2024-03-12 DIAGNOSIS — Z888 Allergy status to other drugs, medicaments and biological substances status: Secondary | ICD-10-CM | POA: Diagnosis not present

## 2024-03-12 DIAGNOSIS — K2970 Gastritis, unspecified, without bleeding: Secondary | ICD-10-CM | POA: Diagnosis present

## 2024-03-12 DIAGNOSIS — L120 Bullous pemphigoid: Secondary | ICD-10-CM | POA: Diagnosis present

## 2024-03-12 DIAGNOSIS — F4310 Post-traumatic stress disorder, unspecified: Secondary | ICD-10-CM | POA: Diagnosis present

## 2024-03-12 DIAGNOSIS — Z560 Unemployment, unspecified: Secondary | ICD-10-CM | POA: Diagnosis not present

## 2024-03-12 DIAGNOSIS — Z8042 Family history of malignant neoplasm of prostate: Secondary | ICD-10-CM | POA: Diagnosis not present

## 2024-03-12 LAB — CBC
HCT: 32 % — ABNORMAL LOW (ref 36.0–46.0)
Hemoglobin: 10.4 g/dL — ABNORMAL LOW (ref 12.0–15.0)
MCH: 30.7 pg (ref 26.0–34.0)
MCHC: 32.5 g/dL (ref 30.0–36.0)
MCV: 94.4 fL (ref 80.0–100.0)
Platelets: 208 10*3/uL (ref 150–400)
RBC: 3.39 MIL/uL — ABNORMAL LOW (ref 3.87–5.11)
RDW: 14.4 % (ref 11.5–15.5)
WBC: 3.3 10*3/uL — ABNORMAL LOW (ref 4.0–10.5)
nRBC: 0 % (ref 0.0–0.2)

## 2024-03-12 LAB — BASIC METABOLIC PANEL WITH GFR
Anion gap: 7 (ref 5–15)
BUN: 12 mg/dL (ref 6–20)
CO2: 25 mmol/L (ref 22–32)
Calcium: 8.8 mg/dL — ABNORMAL LOW (ref 8.9–10.3)
Chloride: 108 mmol/L (ref 98–111)
Creatinine, Ser: 0.75 mg/dL (ref 0.44–1.00)
GFR, Estimated: >60 mL/min (ref >60)
Glucose, Bld: 87 mg/dL (ref 70–99)
Potassium: 3.5 mmol/L (ref 3.5–5.1)
Sodium: 140 mmol/L (ref 135–145)

## 2024-03-12 MED ORDER — TECHNETIUM TC 99M-LABELED RED BLOOD CELLS IV KIT
20.0000 | PACK | Freq: Once | INTRAVENOUS | Status: AC | PRN
  Administered 2024-03-12: 20.3 via INTRAVENOUS

## 2024-03-12 MED ORDER — POLYETHYLENE GLYCOL 3350 17 G PO PACK
17.0000 g | PACK | Freq: Every day | ORAL | Status: DC
  Filled 2024-03-12 (×3): qty 1

## 2024-03-12 MED ORDER — HYOSCYAMINE SULFATE 0.125 MG SL SUBL
0.1250 mg | SUBLINGUAL_TABLET | Freq: Four times a day (QID) | SUBLINGUAL | Status: DC | PRN
  Administered 2024-03-13: 0.125 mg via SUBLINGUAL
  Filled 2024-03-12: qty 1

## 2024-03-12 MED ORDER — HEPARIN SOD (PORK) LOCK FLUSH 100 UNIT/ML IV SOLN
INTRAVENOUS | Status: AC
  Filled 2024-03-12: qty 5

## 2024-03-12 NOTE — Plan of Care (Signed)
 Pt is alert and oriented x 4. Up adlib. SCD on. Dilaudid  given phenergan  given pt asleep following. Woke up with vitals and requesting additional pain medication. On call provider aware no additional meds ordered.  Problem: Pain Managment: Goal: General experience of comfort will improve and/or be controlled Outcome: Not Progressing   Problem: Education: Goal: Knowledge of General Education information will improve Description: Including pain rating scale, medication(s)/side effects and non-pharmacologic comfort measures Outcome: Progressing   Problem: Health Behavior/Discharge Planning: Goal: Ability to manage health-related needs will improve Outcome: Progressing   Problem: Clinical Measurements: Goal: Ability to maintain clinical measurements within normal limits will improve Outcome: Progressing Goal: Will remain free from infection Outcome: Progressing Goal: Diagnostic test results will improve Outcome: Progressing Goal: Respiratory complications will improve Outcome: Progressing Goal: Cardiovascular complication will be avoided Outcome: Progressing   Problem: Activity: Goal: Risk for activity intolerance will decrease Outcome: Progressing   Problem: Nutrition: Goal: Adequate nutrition will be maintained Outcome: Progressing   Problem: Coping: Goal: Level of anxiety will decrease Outcome: Progressing   Problem: Elimination: Goal: Will not experience complications related to bowel motility Outcome: Progressing Goal: Will not experience complications related to urinary retention Outcome: Progressing   Problem: Safety: Goal: Ability to remain free from injury will improve Outcome: Progressing   Problem: Skin Integrity: Goal: Risk for impaired skin integrity will decrease Outcome: Progressing

## 2024-03-12 NOTE — Progress Notes (Signed)
 Mobility Specialist Progress Note:    03/12/24 1106  Mobility  Activity Ambulated with assistance in room;Transferred from bed to chair;Ambulated with assistance to bathroom  Level of Assistance Modified independent, requires aide device or extra time  Assistive Device None  Distance Ambulated (ft) 30 ft  Range of Motion/Exercises Active;All extremities  Activity Response Tolerated well  Mobility Referral Yes  Mobility visit 1 Mobility  Mobility Specialist Start Time (ACUTE ONLY) 1014  Mobility Specialist Stop Time (ACUTE ONLY) 1037  Mobility Specialist Time Calculation (min) (ACUTE ONLY) 23 min   Pt received in bed, agreeable to mobility. ModI to stand and ambulate with no AD. Tolerated well, asx throughout. Left pt in chair, all needs met.   Sherrilee Ditty Mobility Specialist Please contact via Special educational needs teacher or  Rehab office at [phone]

## 2024-03-12 NOTE — Progress Notes (Signed)
 PROGRESS NOTE  Sabrina [name] FMW:[id number] DOB: [birth year] DOA: 03/11/2024 PCP: [name], MD  Brief History:  58 y/o female with history of bullous pemphigoid on prior methotrexate/steoprids follows with [facility]  and was taken off methotrexate around end 09/2023 multinodular goiter, depression/anxiety, chronic knee pain status post meniscal repair 2020, chronic abd pain presented to the ED with complaints of vomiting blood, with blood in stools over the past 2 days with severe abdominal pain. Patient reported on 03/11/24, she had small, normal BM in the morning, then passed blood only with clots per rectum. No recurrent rectal bleeding. Went to work. Got sick in the bathroom. Vomited a pretty good amount of bright red blood x 2.No coffee ground emesis. The 3rd episode was  just specks. Last episode of hematemesis was around 2 pm on 03/11/24. It's the most blood I have ever vomited It's the most abdominal pain I've ever had   Prior to acute onset symptoms last week, had been doing fine for about 1.5 months without pain, nausea, vomiting, or bleeding.   Recent hospitalization 5/7- 5/9-also with concerns for GI bleed, had CT angio negative for evidence of active GI bleed.  Hemoglobin was stable at 12.  Had EGD with findings of nonbleeding gastric ulcers, gastritis.   Patient is a surgical nurse, works in a [employer].  Depression/anxiety -Continue home dose diazepam  and venlafaxine  -PDMP reviewed--denies, 10 mg, last refill 12/08/2023   Chronic pain syndrome -Continue gabapentin    Multinodular goiter -11/24/2023 thyroid  biopsy--benign follicular nodule   History of bullous pemphigoid -Now off of any immunomodulators and DMA RD's-outpatient follow-up with her dermatologist at [facility] as above for further management   Obesity -BMI 30.29 -lifestyle modfication   Assessment/Plan: Hematemesis/Hematochezia -Hgb remains stable -Baseline Hgb 10-11 -  remains hemodynamically stable - trend Hgb - GI consult appreciated - Bleeding scan planned 7/1 Has had several EGDs since January of this year in our system-2/13, 4/3, 4/8, 5/8.  Also additional EGDs 5/12 and 6/27-at [facility].  - Most recent EGD [facility] 6/27-no evidence of bleeding, esophageal mucosa normal, mild erythema in gastric antrum.  Multiple biopsies done to rule out H. pylori. - CT angio today no findings to localize reported GI bleed.  Abdominal pain -chronic lower abd -PDMP reviewed--on gabapentin  -last opioid was tramadol , #10 filled on 12/26/23 -reported pain is out of proportion with objective findings on exam  Generalized anxiety disorder with panic attacks Reports severe anxiety, PTSD. -Resume Valium  10 mg 3 times daily as needed   Multinodular goiter -11/24/2023 thyroid  biopsy--benign follicular nodule   History of bullous pemphigoid -Now off of any immunomodulators and DMA RD's-outpatient follow-up with her dermatologist at [facility] as above for further management   Obesity -BMI 30.74 -lifestyle modification         Family Communication:   no Family at bedside  Consultants:  GI  Code Status:  FULL   DVT Prophylaxis:  SCDs   Procedures: As Listed in Progress Note Above  Antibiotics: None       Subjective: Patient denies fevers, chills, headache, chest pain, dyspnea, nausea, vomiting, diarrhea, abdominal pain, dysuria, hematuria, hematochezia, and melena.   Objective: Vitals:   03/11/24 2117 03/12/24 0115 03/12/24 0451 03/12/24 1005  BP:  114/82 101/74 95/73  Pulse:  80 83 64  Resp:  16 17 16   Temp:  97.9 F (36.6 C) 98 F (36.7 C)   TempSrc:  Oral Oral   SpO2: 100% 100% 96% 100%  Weight:      Height:        Intake/Output Summary (Last 24 hours) at 03/12/2024 1036 Last data filed at 03/12/2024 0300 Gross per 24 hour  Intake 323.28 ml  Output --  Net 323.28 ml   Weight change:  Exam:  General:  Pt is alert,  follows commands appropriately, not in acute distress HEENT: No icterus, No thrush, No neck mass, [place]/AT Cardiovascular: RRR, S1/S2, no rubs, no gallops Respiratory: CTA bilaterally, no wheezing, no crackles, no rhonchi Abdomen: Soft/+BS, non tender, non distended, no guarding Extremities: No edema, No lymphangitis, No petechiae, No rashes, no synovitis   Data Reviewed: I have personally reviewed following labs and imaging studies Basic Metabolic Panel: Recent Labs  Lab 03/11/24 1623 03/12/24 0408  NA 139 140  K 3.8 3.5  CL 106 108  CO2 25 25  GLUCOSE 91 87  BUN 15 12  CREATININE 0.89 0.75  CALCIUM  9.2 8.8*   Liver Function Tests: Recent Labs  Lab 03/11/24 1623  AST 25  ALT 20  ALKPHOS 92  BILITOT 0.3  PROT 7.3  ALBUMIN 4.2   Recent Labs  Lab 03/11/24 1623  LIPASE 33   No results for input(s): AMMONIA in the last 168 hours. Coagulation Profile: Recent Labs  Lab 03/11/24 1623  INR 1.0   CBC: Recent Labs  Lab 03/11/24 1623 03/11/24 2118 03/12/24 0408  WBC 4.4  --  3.3*  NEUTROABS 2.3  --   --   HGB 11.3* 12.1 10.4*  HCT 34.8* 37.9 32.0*  MCV 94.3  --  94.4  PLT 223  --  208   Cardiac Enzymes: No results for input(s): CKTOTAL, CKMB, CKMBINDEX, TROPONINI in the last 168 hours. BNP: Invalid input(s): POCBNP CBG: No results for input(s): GLUCAP in the last 168 hours. HbA1C: No results for input(s): HGBA1C in the last 72 hours. Urine analysis:    Component Value Date/Time   COLORURINE STRAW (A) 03/11/2024 1900   APPEARANCEUR CLEAR 03/11/2024 1900   LABSPEC 1.009 03/11/2024 1900   PHURINE 7.0 03/11/2024 1900   GLUCOSEU NEGATIVE 03/11/2024 1900   HGBUR NEGATIVE 03/11/2024 1900   BILIRUBINUR NEGATIVE 03/11/2024 1900   KETONESUR NEGATIVE 03/11/2024 1900   PROTEINUR NEGATIVE 03/11/2024 1900   UROBILINOGEN 0.2 07/05/2015 0627   NITRITE NEGATIVE 03/11/2024 1900   LEUKOCYTESUR SMALL (A) 03/11/2024 1900   Sepsis  Labs: @LABRCNTIP (procalcitonin:4,lacticidven:4) )No results found for this or any previous visit (from the past 240 hours).   Scheduled Meds:  gabapentin   600 mg Oral TID   pantoprazole  (PROTONIX ) IV  40 mg Intravenous Q12H   polyethylene glycol  17 g Oral Daily   sucralfate   1 g Oral QID   Continuous Infusions:  lactated ringers  75 mL/hr at 03/12/24 0410   promethazine  (PHENERGAN ) injection (IM or IVPB) 12.5 mg (03/12/24 0647)    Procedures/Studies: CT Angio Abd/Pel W and/or Wo Contrast Result Date: 03/11/2024 CLINICAL DATA:  Nausea, vomiting, diarrhea, severe abdominal pain, blood in both emesis and stool EXAM: CTA ABDOMEN AND PELVIS WITHOUT AND WITH CONTRAST TECHNIQUE: Multidetector CT imaging of the abdomen and pelvis was performed using the standard protocol during bolus administration of intravenous contrast. Multiplanar reconstructed images and MIPs were obtained and reviewed to evaluate the vascular anatomy. RADIATION DOSE REDUCTION: This exam was performed according to the departmental dose-optimization program which includes automated exposure control, adjustment of the mA and/or kV according to patient size and/or use of  iterative reconstruction technique. CONTRAST:  OMNIPAQUE  IOHEXOL  350 MG/ML SOLN COMPARISON:  CT abdomen pelvis, 03/09/2024, CT angiogram abdomen pelvis, 01/17/2024 FINDINGS: VASCULAR Normal contour and caliber of the abdominal aorta. No evidence of aneurysm, dissection, or other acute aortic pathology. Standard branching pattern of the abdominal aorta with solitary bilateral renal arteries. Review of the MIP images confirms the above findings. NON-VASCULAR Lower Chest: No acute findings. Hepatobiliary: No solid liver abnormality is seen. Numerous benign liver cysts, requiring no further follow-up or characterization. Status post cholecystectomy. Unchanged mild postoperative biliary dilatation. Pancreas: Unremarkable. No pancreatic ductal dilatation or surrounding  inflammatory changes. Spleen: Normal in size without significant abnormality. Adrenals/Urinary Tract: Adrenal glands are unremarkable. Kidneys are normal, without renal calculi, solid lesion, or hydronephrosis. Bladder is unremarkable. Stomach/Bowel: Stomach is within normal limits. Appendectomy. No evidence of bowel wall thickening, distention, or inflammatory changes. Sigmoid diverticulosis. Moderate burden of dense stool throughout the colon and rectum. Lymphatic: No enlarged abdominal or pelvic lymph nodes. Reproductive: Status post hysterectomy. Other: No abdominal wall hernia or abnormality. No ascites. Musculoskeletal: No acute osseous findings. IMPRESSION: 1. No intraluminal contrast extravasation or other findings to localize reported GI bleeding. 2. Normal contour and caliber of the abdominal aorta. No evidence of aneurysm, dissection, or other acute aortic pathology. No significant atherosclerosis. 3. Sigmoid diverticulosis without evidence of acute diverticulitis. 4. Moderate burden of dense stool throughout the colon and rectum. 5. Status post cholecystectomy, appendectomy, and hysterectomy. Electronically Signed   By: Marolyn JONETTA Jaksch M.D.   On: 03/11/2024 18:33    Alm Schneider, DO  [facility]  If 7PM-7AM, please contact night-coverage [email or web address] Password [email or web address] 03/12/2024, 10:36 AM   LOS: 0 days

## 2024-03-12 NOTE — Hospital Course (Addendum)
 58 y/o female with history of bullous pemphigoid on prior methotrexate/steoprids follows with [facility]  and was taken off methotrexate around end 09/2023 multinodular goiter, depression/anxiety, chronic knee pain status post meniscal repair 2020, chronic abd pain presented to the ED with complaints of vomiting blood, with blood in stools over the past 2 days with severe abdominal pain. Patient reported on 03/11/24, she had small, normal BM in the morning, then passed blood only with clots per rectum. No recurrent rectal bleeding. Went to work. Got sick in the bathroom. Vomited a pretty good amount of bright red blood x 2.No coffee ground emesis. The 3rd episode was  just specks. Last episode of hematemesis was around 2 pm on 03/11/24. It's the most blood I have ever vomited It's the most abdominal pain I've ever had   Prior to acute onset symptoms last week, had been doing fine for about 1.5 months without pain, nausea, vomiting, or bleeding.   Recent hospitalization 5/7- 5/9-also with concerns for GI bleed, had CT angio negative for evidence of active GI bleed.  Hemoglobin was stable at 12.  Had EGD with findings of nonbleeding gastric ulcers, gastritis.   Patient is a surgical nurse, works in a [employer]. GI consulted to assist with management

## 2024-03-12 NOTE — Plan of Care (Signed)

## 2024-03-12 NOTE — Consult Note (Signed)
 @LOGO @   Referring Provider: [facility]  Primary Care Physician:  [name], [name], MD Primary Gastroenterologist:  Dr. Cinderella  Date of Admission: 03/11/24 Date of Consultation: 03/12/24  Reason for Consultation:  hematemesis, rectal bleeding  HPI:  Sabrina [name] is a 58 y.o. year old female with history of arthritis, bullous pemphigus, osteomyelitis, anxiety, hepatic cyst, prior cholecystectomy and appendectomy, recurrent hematemesis in the setting of esophagitis, gastric and duodenal angioectasias s/p APC with development if gastric ulceration thereafter but healed on most recent EGD in June 2025, normal colonoscopy this year (other than hemorrhoids), and normal capsule endoscopy April 2025, who returned to the ER on 03/11/24 with complaint of nausea, vomiting, abdominal pain with reports of seeing blood in both emesis and stool.    ED course: Hemodynamically stable Hemoglobin 11.3 (stable- 11.5 on 6/29, 11.6 on 6/27) No other significant laboratory abnormalities.  CT angio abdomen pelvis with and without contrast: No acute findings.  No localization of GI bleeding, moderate burden of dense stool throughout the colon and rectum.    Today:  3-4 days ago started vomiting specks of blood. States she developed mid abdominal pain that indicated up and down. Thinks pain started before vomiting started.   Yesterday, she developed more severe abdominal pain, worse than she has ever had in the past. Doesn't seem to improve with pain medications. Burns some, but also sharp. Not worsened or improved by emesis or defecation.   Yesterday, had a small, normal BM in the morning, then passed blood only with clots per rectum. No recurrent rectal bleeding. Went to work. Got sick in the bathroom. Vomited a pretty good amount of blood x 2. Bright red. No coffee ground emesis. The 3rd episode was  just specks. Last episode of hematemesis was around 2 pm. Just very nauseated secondary to ongoing  abdominal pain.   Has only been eating ice cubs and sips of water for the last 3-4 days due to feeling so nauseated.   Overall, volume of blood seems more than previously.   Some chronic low pelvic pain that pre-dates April. This hasn't changed.     Prior to acute onset symptoms last week, had been doing fine for about 1.5 months without pain, nausea, vomiting, or bleeding.   No new medications. No diet change. Hasn't eaten out. No sick contacts.   NSAIDs: None.  ETOH: None. Illicit drug use/THC: None.  Tobacco: None.    Notably, patient was evaluated at [facility] on 6/27, 6/28, and 6/29 for the same symptoms.  No significant laboratory abnormalities aside from mild anemia with hemoglobin in the 11 range.  No acute abnormalities on CT.  EGD on 6/27 as discussed below with no significant abnormalities.   Procedure history: EGD 03/08/2024 at [facility]: NO Evidence of old or fresh blood  The esophageal mucosa appeared normal.  There was mild erethyma seen in the gastric antrum. Multiple biopsies were  done from the gastric body, antrum and incisura to rule out H.pylori.  The first and second part of the duodenum appeared normal.  - Hyperemia, focal minimal chronic inflammation and patchy mild reactive glandular changes, no H. pylori.  EGD 01/22/2024 at [facility]: Gastric ulcer and erosions  Hiatal hernia  Pathology with mild chronic gastritis with reactive features.  No H. pylori.  EGD 01/18/2024 by Dr. Cindie - Small hiatal hernia.  - Non- bleeding gastric ulcers with a clean ulcer base ( Forrest Class III) .  - Gastritis. Biopsied. - Normal duodenal  bulb, first portion of the duodenum and second portion of the duodenum. - Stated multiple nearly healed ulcers corresponding to previously noted post APC gastric ulcers. Extensive/ patchy gastritis noted. Possibly this is what may have oozed. Biopsies taken to rule out H. pylori. - Pathology showed reactive gastropathy with  focal ulceration and acute inflammation, no H. pylori.  EGD 12/19/23 by Dr. Eartha: - 1 cm hiatal hernia.  - Oozing gastric ulcers with oozing hemorrhage ( Forrest Class Ib) . Injected. Clips were placed. Clip manufacturer: [employer].  - Normal examined duodenum.  Givens capsule 12/22/2023: No abnormalities.  EGD 12/14/23 by Dr. Cinderella - Normal esophagus. - Gastritis. Biopsied. 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 Treated with argon plasma coagulation ( APC) . Many of them were actively oozing and others with stigmata of recent bleed.  - A single non- bleeding angioectasia in the duodenum. Treated with argon plasma coagulation ( APC) . - Gastric biopsy with active gastritis, no H. pylori.  EGD 10/26/2023 by Dr. Cinderella: - LA Grade A reflux esophagitis.  - Gastritis. Biopsied. - 2 cm hiatal hernia.  - Duodenal mucosal lymphangiectasia. -  gastric and duodenal biopsies were benign.    Colonoscopy 12/22/2023 by Dr. Cinderella - The examined portion of the ileum was normal.  - Non- bleeding internal hemorrhoids.  - No specimens collected.  - There no evidence of old or active bleeding in the entire colon and ileum examined  Past Medical History:  Diagnosis Date   Arthritis    knees (09/06/2017)   Bullous pemphigus    Cat bite 06/2014   to left elbow   Hiatal hernia    History of blood transfusion 1988   when I had my baby   Muscle weakness of lower extremity 2001; 09/05/2017   resolved after a couple weeks; ? (09/06/2017)   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HCC)    Poisoning, snake bite 04/08/2016   [place]; RUE   PONV (postoperative nausea and vomiting)    PUD (peptic ulcer disease)    S/P right knee surgery    Situational anxiety    Staph infection ~ 2015   left elbow and finger   Thyroid  mass     Past Surgical History:  Procedure Laterality Date   APPENDECTOMY  ~ 1987   APPLICATION OF A-CELL OF EXTREMITY Left 08/05/2015   Procedure: APPLICATION OF A-CELL  OF EXTREMITY;  Surgeon: Estefana GORMAN Fritter, DO;  Location: [facility];  Service: Plastics;  Laterality: Left;   BIOPSY  10/26/2023   Procedure: BIOPSY;  Surgeon: [name], MD;  [facility];  Service: Endoscopy;;   BREAST SURGERY Right 1990   milk duct taken out   CHONDROPLASTY Right 02/19/2019   Procedure: CHONDROPLASTY; EXCISION EXOSTOSIS;  Surgeon: Anderson Maude ORN, MD;  Location: [facility];  Service: Orthopedics;  Laterality: Right;   COLONOSCOPY N/A 12/22/2023   Procedure: COLONOSCOPY;  Surgeon: [name], MD;  [facility];  Service: Endoscopy;  Laterality: N/A;   DEBRIDEMENT AND CLOSURE WOUND Left 07/01/2015   Procedure: LEFT ELBOW EXCISION OF WOUND WITH PRIMARY CLOSURE 2X5 CM ;  Surgeon: Estefana GORMAN Fritter, DO;  Location: [facility];  Service: Plastics;  Laterality: Left;   ELBOW SURGERY Left X 23 in [place]  <06/2015   from a cat bite; all I&D   ESOPHAGOGASTRODUODENOSCOPY N/A 12/14/2023   Procedure: EGD (ESOPHAGOGASTRODUODENOSCOPY);  Surgeon: [name], MD;  [facility];  Service: Endoscopy;  Laterality: N/A;   ESOPHAGOGASTRODUODENOSCOPY N/A 12/19/2023   Procedure: EGD (ESOPHAGOGASTRODUODENOSCOPY);  Surgeon: Eartha Flavors, Toribio, MD;  [facility];  Service: Gastroenterology;  Laterality: N/A;   ESOPHAGOGASTRODUODENOSCOPY N/A 01/18/2024   Procedure: EGD (ESOPHAGOGASTRODUODENOSCOPY);  Surgeon: Cindie Carlin POUR, DO;  [facility];  Service: Endoscopy;  Laterality: N/A;   ESOPHAGOGASTRODUODENOSCOPY (EGD) WITH PROPOFOL  N/A 10/26/2023   Procedure: ESOPHAGOGASTRODUODENOSCOPY (EGD) WITH PROPOFOL ;  Surgeon: [name], MD;  [facility];  Service: Endoscopy;  Laterality: N/A;   GIVENS CAPSULE STUDY N/A 12/22/2023   Procedure: IMAGING PROCEDURE, GI TRACT, INTRALUMINAL, VIA CAPSULE;  Surgeon: [name], MD;  [facility];  Service:  Endoscopy;  Laterality: N/A;   HOT HEMOSTASIS  12/14/2023   Procedure: EGD, WITH ARGON PLASMA COAGULATION;  Surgeon: [name], MD;  [facility];  Service: Endoscopy;;   I & D EXTREMITY Left 07/08/2015   Procedure: IRRIGATION AND DEBRIDEMENT EXTREMITY, DRAINAGE OF LEFT ARM WOUND, A-CELL PLACEMENT, WOUND VAC PLACEMENT;  Surgeon: Estefana RAMAN Dillingham, DO;  Location: [facility];  Service: Plastics;  Laterality: Left;   INCISION AND DRAINAGE OF WOUND Left 08/05/2015   Procedure: IRRIGATION AND DEBRIDEMENT LEFT ELBOW WOUND, PLACEMENT OF ACELL;  Surgeon: Estefana RAMAN Fritter, DO;  Location: [facility];  Service: Plastics;  Laterality: Left;   KNEE ARTHROSCOPY WITH MEDIAL MENISECTOMY Right 02/19/2019   Procedure: RIGHT KNEE ARTHROSCOPY, DEBRIDEMENT, PARTIAL MEDIAL AND LATERAL MENISECTOMY;  Surgeon: Anderson Maude ORN, MD;  Location: [facility];  Service: Orthopedics;  Laterality: Right;   LAPAROSCOPIC CHOLECYSTECTOMY  1998   SKIN GRAFT Left 2016   took from anterior thigh; placed at elbow   TONSILLECTOMY  ~ 2000   TOTAL ABDOMINAL HYSTERECTOMY  2003   WRIST SURGERY Right 01/2016    Prior to Admission medications   Medication Sig Start Date End Date Taking? Authorizing Provider  acetaminophen  (TYLENOL ) 325 MG tablet Take 325-650 mg by mouth every 6 (six) hours as needed for mild pain (pain score 1-3).   Yes [provider]  butalbital -apap-caffeine -codeine  (FIORICET  WITH CODEINE ) 50-325-40-30 MG capsule Take 1 capsule by mouth daily as needed for headache or migraine. 02/09/24  Yes [provider]  diazepam  (VALIUM ) 10 MG tablet Take 1 tablet (10 mg total) by mouth 3 (three) times daily as needed for anxiety. Patient taking differently: Take 10 mg by mouth every 8 (eight) hours as needed for anxiety. 03/05/24  Yes Plovsky, Elna, MD  dicyclomine  (BENTYL ) 20 MG tablet Take 20 mg by mouth daily as needed for spasms. 03/09/24 03/19/24 Yes [provider]  FERREX 150 150 MG capsule Take 150 mg by mouth daily.   Yes [provider]  gabapentin  (NEURONTIN ) 300 MG capsule Take 2 capsules (600 mg total) by mouth 3 (three) times daily. 08/10/21  Yes Theophilus Andrews, Tully GRADE, MD  naloxone [facility]) nasal spray 4 mg/0.1 mL Place 1 spray into the nose once. 01/24/24  Yes [provider]  pantoprazole  (PROTONIX ) 40 MG tablet Take 1 tablet (40 mg total) by mouth 2 (two) times daily. 12/19/23 03/11/24 Yes Shahmehdi, Adriana LABOR, MD  rosuvastatin  (CRESTOR ) 20 MG tablet Take 1 tablet by mouth daily. 06/08/22  Yes [provider]  sucralfate  (CARAFATE ) 1 g tablet Take 1 tablet (1 g total) by mouth 4 (four) times daily. 01/19/24 03/11/24 Yes Johnson, Clanford L, MD  DULoxetine  (CYMBALTA ) 60 MG capsule Take 1 capsule (60 mg total) by mouth daily. Patient not  taking: Reported on 03/11/2024 03/05/24   Tasia Lung, MD    Current Facility-Administered Medications  Medication Dose Route Frequency Provider Last Rate Last Admin   acetaminophen  (TYLENOL ) tablet 650 mg  650 mg Oral Q6H PRN Emokpae, Ejiroghene E, MD       Or   acetaminophen  (TYLENOL ) suppository 650 mg  650 mg Rectal Q6H PRN Emokpae, Ejiroghene E, MD       diazepam  (VALIUM ) tablet 10 mg  10 mg Oral TID PRN Emokpae, Ejiroghene E, MD       gabapentin  (NEURONTIN ) capsule 600 mg  600 mg Oral TID Emokpae, Ejiroghene E, MD   600 mg at 03/11/24 2135   HYDROmorphone  (DILAUDID ) injection 0.5 mg  0.5 mg Intravenous Q4H PRN Emokpae, Ejiroghene E, MD   0.5 mg at 03/12/24 0410   lactated ringers  infusion   Intravenous Continuous Emokpae, Ejiroghene E, MD 75 mL/hr at 03/12/24 0410 New Bag at 03/12/24 0410   pantoprazole  (PROTONIX ) injection 40 mg  40 mg Intravenous Q12H Emokpae, Ejiroghene E, MD   40 mg at 03/12/24 0751   polyethylene glycol (MIRALAX  / GLYCOLAX ) packet 17 g  17 g Oral Daily PRN Emokpae, Ejiroghene E, MD       promethazine  (PHENERGAN ) 12.5 mg in sodium chloride  0.9 % 50  mL IVPB  12.5 mg Intravenous Q6H PRN Emokpae, Ejiroghene E, MD 150 mL/hr at 03/12/24 0647 12.5 mg at 03/12/24 9352   sucralfate  (CARAFATE ) tablet 1 g  1 g Oral QID Emokpae, Ejiroghene E, MD   1 g at 03/11/24 2135    Allergies as of 03/11/2024 - Review Complete 03/11/2024  Allergen Reaction Noted   Droperidol Palpitations and Other (See Comments) 07/01/2015   Ondansetron  Hives, Swelling, and Rash 03/09/2015   Benzonatate  Rash and Other (See Comments) 09/21/2015   Bromfed dm [pseudoeph-bromphen-dm] Anxiety 09/17/2021   Diclofenac  sodium Rash 08/14/2018   Flexeril [cyclobenzaprine] Anxiety 01/18/2017    Family History  Problem Relation Age of Onset   Liver disease Mother    Dementia Mother    Cirrhosis Mother    Prostate cancer Father    Colon cancer Maternal Grandmother    Ovarian cancer Maternal Aunt     Social History   Socioeconomic History   Marital status: Divorced    Spouse name: Not on file   Number of children: 1   Years of education: Not on file   Highest education level: Associate degree: academic program  Occupational History   Occupation: unemployed  Tobacco Use   Smoking status: Never   Smokeless tobacco: Never  Vaping Use   Vaping status: Never Used  Substance and Sexual Activity   Alcohol use: Not Currently    Alcohol/week: 0.0 standard drinks of alcohol   Drug use: No   Sexual activity: Not Currently  Other Topics Concern   Not on file  Social History Narrative   Pateint is right-handed. She lives alone in a single level home. She rarely drinks caffeine . She is limited to exercise due to knee injuries.   Social Drivers of Health   Financial Resource Strain: High Risk (06/05/2022)   Received from [facility] visits prior to 11/12/2022., [facility]   Overall Financial Resource Strain (CARDIA)    Difficulty of Paying Living Expenses: Hard  Food Insecurity: No Food Insecurity (03/11/2024)   Hunger Vital Sign    Worried About  Running Out of Food in the Last Year: Never true    Ran Out of Food in the  Last Year: Never true  Recent Concern: Food Insecurity - Food Insecurity Present (12/19/2023)   Hunger Vital Sign    Worried About Running Out of Food in the Last Year: Sometimes true    Ran Out of Food in the Last Year: Patient declined  Transportation Needs: No Transportation Needs (03/11/2024)   PRAPARE - Administrator, Civil Service (Medical): No    Lack of Transportation (Non-Medical): No  Physical Activity: Inactive (06/05/2022)   Received from [facility] visits prior to 11/12/2022., [facility]   Exercise Vital Sign    On average, how many days per week do you engage in moderate to strenuous exercise (like a brisk walk)?: 0 days    On average, how many minutes do you engage in exercise at this level?: 10 min  Stress: Stress Concern Present (06/05/2022)   Received from [facility] prior to 11/12/2022., [facility]   [employer] of Occupational Health - Occupational Stress Questionnaire    Feeling of Stress : Rather much  Social Connections: Moderately Isolated (12/21/2023)   Social Connection and Isolation Panel    Frequency of Communication with Friends and Family: More than three times a week    Frequency of Social Gatherings with Friends and Family: Once a week    Attends Religious Services: More than 4 times per year    Active Member of [employer] or Organizations: No    Attends Banker Meetings: Never    Marital Status: Divorced  Catering manager Violence: Not At Risk (03/11/2024)   Humiliation, Afraid, Rape, and Kick questionnaire    Fear of Current or Ex-Partner: No    Emotionally Abused: No    Physically Abused: No    Sexually Abused: No    Review of Systems: Gen: Denies fever, chills, cold or flulike symptoms, presyncope, syncope. CV: Denies chest pain, heart palpitations. Resp: Denies shortness of breath, cough. GI:  See HPI GU : Denies urinary burning, urinary frequency, urinary incontinence.  MS: Denies joint pain. Derm: Denies rash. Psych: Denies depression, anxiety. Heme: See HPI  Physical Exam: Vital signs in last 24 hours: Temp:  [97.9 F (36.6 C)-98.5 F (36.9 C)] 98 F (36.7 C) (07/01 0451) Pulse Rate:  [77-108] 83 (07/01 0451) Resp:  [16-20] 17 (07/01 0451) BP: (101-144)/(74-100) 101/74 (07/01 0451) SpO2:  [90 %-100 %] 96 % (07/01 0451) FiO2 (%):  [21 %] 21 % (06/30 2117) Weight:  [78 kg-81.2 kg] 81.2 kg (06/30 2100) Last BM Date : 03/11/24 General:   Alert,  Well-developed, well-nourished, pleasant and cooperative in NAD Head:  Normocephalic and atraumatic. Eyes:  Sclera clear, no icterus.   Conjunctiva pink. Ears:  Normal auditory acuity. Nose:  No deformity, discharge,  or lesions. Lungs:  Clear throughout to auscultation.   No wheezes, crackles, or rhonchi. No acute distress. Heart:  Regular rate and rhythm; no murmurs, clicks, rubs,  or gallops. Abdomen:  Soft, nondistended, mild generalized TTP.  No masses, hepatosplenomegaly or hernias noted. Normal bowel sounds, without guarding, and without rebound.   Rectal:  Deferred  Msk:  Symmetrical without gross deformities. Normal posture. Pulses:  Normal pulses noted. Extremities:  Without edema. Neurologic:  Alert and  oriented x4;  grossly normal neurologically. Skin:  Intact without significant lesions or rashes. Psych:  Normal mood and affect.  Intake/Output from previous day: 06/30 0701 - 07/01 0700 In: 323.3 [I.V.:223.3; IV Piggyback:100] Out: -  Intake/Output this shift: No intake/output data recorded.  Lab Results: Recent Labs    03/11/24 1623 03/11/24 2118 03/12/24 0408  WBC 4.4  --  3.3*  HGB 11.3* 12.1 10.4*  HCT 34.8* 37.9 32.0*  PLT 223  --  208   BMET Recent Labs    03/11/24 1623 03/12/24 0408  NA 139 140  K 3.8 3.5  CL 106 108  CO2 25 25  GLUCOSE 91 87  BUN 15 12  CREATININE 0.89 0.75   CALCIUM  9.2 8.8*   LFT Recent Labs    03/11/24 1623  PROT 7.3  ALBUMIN 4.2  AST 25  ALT 20  ALKPHOS 92  BILITOT 0.3  BILIDIR 0.1  IBILI 0.2*   PT/INR Recent Labs    03/11/24 1623  LABPROT 13.4  INR 1.0     Studies/Results: CT Angio Abd/Pel W and/or Wo Contrast Result Date: 03/11/2024 CLINICAL DATA:  Nausea, vomiting, diarrhea, severe abdominal pain, blood in both emesis and stool EXAM: CTA ABDOMEN AND PELVIS WITHOUT AND WITH CONTRAST TECHNIQUE: Multidetector CT imaging of the abdomen and pelvis was performed using the standard protocol during bolus administration of intravenous contrast. Multiplanar reconstructed images and MIPs were obtained and reviewed to evaluate the vascular anatomy. RADIATION DOSE REDUCTION: This exam was performed according to the departmental dose-optimization program which includes automated exposure control, adjustment of the mA and/or kV according to patient size and/or use of iterative reconstruction technique. CONTRAST:  OMNIPAQUE  IOHEXOL  350 MG/ML SOLN COMPARISON:  CT abdomen pelvis, 03/09/2024, CT angiogram abdomen pelvis, 01/17/2024 FINDINGS: VASCULAR Normal contour and caliber of the abdominal aorta. No evidence of aneurysm, dissection, or other acute aortic pathology. Standard branching pattern of the abdominal aorta with solitary bilateral renal arteries. Review of the MIP images confirms the above findings. NON-VASCULAR Lower Chest: No acute findings. Hepatobiliary: No solid liver abnormality is seen. Numerous benign liver cysts, requiring no further follow-up or characterization. Status post cholecystectomy. Unchanged mild postoperative biliary dilatation. Pancreas: Unremarkable. No pancreatic ductal dilatation or surrounding inflammatory changes. Spleen: Normal in size without significant abnormality. Adrenals/Urinary Tract: Adrenal glands are unremarkable. Kidneys are normal, without renal calculi, solid lesion, or hydronephrosis. Bladder is  unremarkable. Stomach/Bowel: Stomach is within normal limits. Appendectomy. No evidence of bowel wall thickening, distention, or inflammatory changes. Sigmoid diverticulosis. Moderate burden of dense stool throughout the colon and rectum. Lymphatic: No enlarged abdominal or pelvic lymph nodes. Reproductive: Status post hysterectomy. Other: No abdominal wall hernia or abnormality. No ascites. Musculoskeletal: No acute osseous findings. IMPRESSION: 1. No intraluminal contrast extravasation or other findings to localize reported GI bleeding. 2. Normal contour and caliber of the abdominal aorta. No evidence of aneurysm, dissection, or other acute aortic pathology. No significant atherosclerosis. 3. Sigmoid diverticulosis without evidence of acute diverticulitis. 4. Moderate burden of dense stool throughout the colon and rectum. 5. Status post cholecystectomy, appendectomy, and hysterectomy. Electronically Signed   By: Marolyn JONETTA Jaksch M.D.   On: 03/11/2024 18:33    Impression: 58 y.o. year old female with history of arthritis, bullous pemphigus, osteomyelitis, anxiety, hepatic cyst, prior cholecystectomy and appendectomy, recurrent hematemesis in the setting of esophagitis, gastric and duodenal angioectasias s/p APC with development if gastric ulceration thereafter but healed on most recent EGD in June 2025, normal colonoscopy this year (other than hemorrhoids), and normal capsule endoscopy April 2025, who returned to the ER on 03/11/24 with complaint of acute onset nausea, vomiting, abdominal pain with reports of seeing blood in both emesis and stool.    Notably, patient reports doing very well for the  last 1.5 months with no significant GI symptoms until 4 days ago when she developed mid abdominal pain with nausea and vomiting with hematemesis.  Reports compliance with outpatient PPI twice daily, Carafate  4 times daily, and NSAID avoidance.  [facility], 6/28, and 6/29 with no significant laboratory  abnormalities aside from mild anemia with hemoglobin in the 11 range (improved from prior), no acute findings on CT.  EGD 6/27 with prior gastric ulceration healed, mild erythema in the gastric antrum.  Upon presentation to the ED yesterday, hemoglobin was fairly stable at 11.3.  No other significant laboratory abnormalities.  CT angio A/P with and without contrast with no acute abnormalities.  She did have a moderate amount of stool in her colon.   Regarding her episode of rectal bleeding, this occurred once yesterday after having a normal small BM, then passed blood only per rectum.   Hgb slightly lower today at 10.4, but this may be secondary to hemodilution in the setting of IV fluids as she has not had any overt GI bleeding since presenting to the ER.    Query whether acute presentation is secondary to viral illness with abdominal pain exacerbated by recurrent emesis. Would also consider cyclic vomiting due to recurrent episodes of vomiting since the beginning of the year. Hematemesis may have been secondary to mallory weiss tear or oozing from gastric erythema. Considering multiple prior endoscopic evaluations this year (detailed in HPI) and most recent EGD on 6/27 with documented healing of gastric ulcerations, I do not think repeat EGD is warranted at this time. Episode of rectal bleeding may have been hemorrhoidal or possibly diverticular. With normal colonoscopy in April 2025, no indication for repeat colonoscopy unless she develops recurrent significant hematochezia.   We have recommended tagged RBC scan to possibly identify area of bleeding. Otherwise, recommend continuing supportive measures for now. Also recommend MiraLAX  due to retained stool.    Plan: NM tagged RBC scan Clear liquid diet Start MiraLAX  17 g daily.  Continue pantoprazole  40 mg BID.  Continue carafate  QID.  Continue supportive measures with antiemetics and analgesics prn.  Will discuss further with Dr. Eartha.     LOS: 0 days    03/12/2024, 8:17 AM   [facility], [facility]

## 2024-03-12 NOTE — TOC Progression Note (Signed)
 Transition of Care [facility]) - Inpatient Brief Assessment   Patient Details  Name: Sabrina [name] MRN: [id number] Date of Birth: [birth year]  Transition of Care [facility]) CM/SW Contact:    Sharlyne Stabs, RN Phone Number: 03/12/2024, 10:10 AM   Clinical Narrative:    Admitted with GI bleed in OBS  assessment completed recently due to high risk readmission score. Pt reports she lives alone and is independent with ADLs. She works full-time. Pt has transportation. No needs reported at this time. TOC will follow.      Transition of Care Asessment: Insurance and Status: Insurance coverage has been reviewed Patient has primary care physician: Yes Home environment has been reviewed: Home Prior level of function:: Independent Prior/Current Home Services: No current home services Social Drivers of Health Review: SDOH reviewed no interventions necessary Readmission risk has been reviewed: Yes Transition of care needs: no transition of care needs at this time      Expected Discharge Plan: Home/Self Care Barriers to Discharge: Continued Medical Work up  Expected Discharge Plan and Services  Social Determinants of Health (SDOH) Interventions SDOH Screenings   Food Insecurity: No Food Insecurity (03/11/2024)  Recent Concern: Food Insecurity - Food Insecurity Present (12/19/2023)  Housing: Low Risk  (03/11/2024)  Recent Concern: Housing - High Risk (12/19/2023)  Transportation Needs: No Transportation Needs (03/11/2024)  Utilities: Not At Risk (03/11/2024)  Depression (PHQ2-9): Medium Risk (01/24/2020)  Financial Resource Strain: High Risk (06/05/2022)   Received from [facility] visits prior to 11/12/2022., [facility]  Physical Activity: Inactive (06/05/2022)   Received from [facility] visits prior to 11/12/2022., [facility]  Social Connections: Moderately Isolated (12/21/2023)  Stress: Stress Concern Present (06/05/2022)   [facility] from [facility] prior to 11/12/2022., [facility]  Tobacco Use: Low Risk  (03/11/2024)    Readmission Risk Interventions    01/17/2024    6:23 PM 12/25/2023    9:01 AM  Readmission Risk Prevention Plan  Transportation Screening Complete Complete  PCP or Specialist Appt within 5-7 Days Complete   Home Care Screening Complete   Medication Review (RN CM) Complete   HRI or Home Care Consult  Complete  Social Work Consult for Recovery Care Planning/Counseling  Complete  Palliative Care Screening  Not Applicable  Medication Review Oceanographer)  Complete

## 2024-03-13 DIAGNOSIS — K922 Gastrointestinal hemorrhage, unspecified: Secondary | ICD-10-CM | POA: Diagnosis not present

## 2024-03-13 DIAGNOSIS — R109 Unspecified abdominal pain: Secondary | ICD-10-CM | POA: Diagnosis not present

## 2024-03-13 DIAGNOSIS — K920 Hematemesis: Secondary | ICD-10-CM | POA: Diagnosis not present

## 2024-03-13 DIAGNOSIS — K625 Hemorrhage of anus and rectum: Secondary | ICD-10-CM | POA: Diagnosis not present

## 2024-03-13 LAB — CBC
HCT: 41.8 % (ref 36.0–46.0)
Hemoglobin: 12.9 g/dL (ref 12.0–15.0)
MCH: 29.5 pg (ref 26.0–34.0)
MCHC: 30.9 g/dL (ref 30.0–36.0)
MCV: 95.7 fL (ref 80.0–100.0)
Platelets: 213 10*3/uL (ref 150–400)
RBC: 4.37 MIL/uL (ref 3.87–5.11)
RDW: 13.9 % (ref 11.5–15.5)
WBC: 2.8 10*3/uL — ABNORMAL LOW (ref 4.0–10.5)
nRBC: 0 % (ref 0.0–0.2)

## 2024-03-13 LAB — BASIC METABOLIC PANEL WITH GFR
Anion gap: 13 (ref 5–15)
BUN: 8 mg/dL (ref 6–20)
CO2: 24 mmol/L (ref 22–32)
Calcium: 9.3 mg/dL (ref 8.9–10.3)
Chloride: 104 mmol/L (ref 98–111)
Creatinine, Ser: 0.7 mg/dL (ref 0.44–1.00)
GFR, Estimated: >60 mL/min (ref >60)
Glucose, Bld: 80 mg/dL (ref 70–99)
Potassium: 3.6 mmol/L (ref 3.5–5.1)
Sodium: 141 mmol/L (ref 135–145)

## 2024-03-13 LAB — MAGNESIUM: Magnesium: 2.5 mg/dL — ABNORMAL HIGH (ref 1.7–2.4)

## 2024-03-13 LAB — CORTISOL-AM, BLOOD: Cortisol - AM: 3.7 ug/dL — ABNORMAL LOW (ref 6.7–22.6)

## 2024-03-13 NOTE — Progress Notes (Signed)
 Mobility Specialist Progress Note:    03/13/24 1118  Mobility  Activity Ambulated with assistance in room;Ambulated with assistance to bathroom;Transferred from bed to chair  Level of Assistance Modified independent, requires aide device or extra time  Assistive Device None  Distance Ambulated (ft) 45 ft  Range of Motion/Exercises Active;All extremities  Activity Response Tolerated well  Mobility Referral Yes  Mobility visit 1 Mobility  Mobility Specialist Start Time (ACUTE ONLY) 1050  Mobility Specialist Stop Time (ACUTE ONLY) 1105  Mobility Specialist Time Calculation (min) (ACUTE ONLY) 15 min   Pt received in room, agreeable to mobility. ModI to stand and ambulate with no AD. Tolerated well, asx throughout. Left pt in chair, all needs met.   Sabrina [name] Mobility Specialist Please contact via Special educational needs teacher or  Rehab office at [phone]

## 2024-03-13 NOTE — Plan of Care (Signed)
 ?  Problem: Coping: ?Goal: Level of anxiety will decrease ?Outcome: Progressing ?  ?Problem: Safety: ?Goal: Ability to remain free from injury will improve ?Outcome: Progressing ?  ?

## 2024-03-13 NOTE — Progress Notes (Signed)
 PROGRESS NOTE    Sabrina [name]  FMW:[id number] DOB: [birth year] DOA: 03/11/2024 PCP: [name], MD   Brief Narrative:    58 y/o female with history of bullous pemphigoid on prior methotrexate/steoprids follows with [facility] and was taken off methotrexate around end 09/2023 multinodular goiter, depression/anxiety, chronic knee pain status post meniscal repair 2020, chronic abd pain presented to the ED with complaints of vomiting blood, with blood in stools over the past 2 days with severe abdominal pain.   Assessment & Plan:   Principal Problem:   GI bleed Active Problems:   Depression   Generalized anxiety disorder with panic attacks   Hematemesis   Abdominal pain   Hematochezia  Assessment and Plan:   Hematemesis/Hematochezia -Hgb remains stable -Baseline Hgb 10-11 - remains hemodynamically stable - trend Hgb - GI consult appreciated - Bleeding scan planned 7/1 Has had several EGDs since January of this year in our system-2/13, 4/3, 4/8, 5/8.  Also additional EGDs 5/12 and 6/27-at [facility].  - Most recent EGD [facility] 6/27-no evidence of bleeding, esophageal mucosa normal, mild erythema in gastric antrum.  Multiple biopsies done to rule out H. pylori. - CT angio today no findings to localize reported GI bleed. - Currently struggling with dietary advancement   Abdominal pain -chronic lower abd -PDMP reviewed--on gabapentin  -last opioid was tramadol , #10 filled on 12/26/23 -reported pain is out of proportion with objective findings on exam -Cortisol levels were elevated with plans to follow-up outpatient with endocrinology -Porphyrin levels are currently pending   Generalized anxiety disorder with panic attacks Reports severe anxiety, PTSD. -Resume Valium  10 mg 3 times daily as needed   Multinodular goiter -11/24/2023 thyroid  biopsy--benign follicular nodule   History of bullous pemphigoid -Now off of any immunomodulators and DMA  RD's-outpatient follow-up with her dermatologist at [facility] as above for further management   Obesity -BMI 30.74 -lifestyle modification    DVT prophylaxis:SCDs Code Status: Full Family Communication: None at bedside Disposition Plan:  Status is: Inpatient Remains inpatient appropriate because: Need for IV medications.   Consultants:  GI  Procedures:  None  Antimicrobials:  None   Subjective: Patient seen and evaluated today with abdominal pain that is ongoing, but somewhat improved.  She thinks that some of this may be related to hunger.  Still reports some nausea, but decreased vomiting episodes and would like to advance her diet today.  Objective: Vitals:   03/12/24 2145 03/13/24 0419 03/13/24 0828 03/13/24 1424  BP: 109/72 95/69 107/65 112/73  Pulse: 83 67 68 87  Resp:  19  16  Temp:  98 F (36.7 C)  (!) 97.4 F (36.3 C)  TempSrc:  Oral  Oral  SpO2:  100%  100%  Weight:      Height:        Intake/Output Summary (Last 24 hours) at 03/13/2024 1445 Last data filed at 03/13/2024 0500 Gross per 24 hour  Intake 480 ml  Output --  Net 480 ml   Filed Weights   03/11/24 1523 03/11/24 2100  Weight: 78 kg 81.2 kg    Examination:  General exam: Appears calm and comfortable  Respiratory system: Clear to auscultation. Respiratory effort normal. Cardiovascular system: S1 & S2 heard, RRR.  Gastrointestinal system: Abdomen is soft Central nervous system: Alert and awake Extremities: No edema Skin: No significant lesions noted Psychiatry: Flat affect.    Data Reviewed: I have personally reviewed following labs and imaging studies  CBC: Recent Labs  Lab 03/11/24 1623 03/11/24 2118 03/12/24 0408 03/13/24 0510  WBC 4.4  --  3.3* 2.8*  NEUTROABS 2.3  --   --   --   HGB 11.3* 12.1 10.4* 12.9  HCT 34.8* 37.9 32.0* 41.8  MCV 94.3  --  94.4 95.7  PLT 223  --  208 213   Basic Metabolic Panel: Recent Labs  Lab 03/11/24 1623 03/12/24 0408 03/13/24 0418   NA 139 140 141  K 3.8 3.5 3.6  CL 106 108 104  CO2 25 25 24   GLUCOSE 91 87 80  BUN 15 12 8   CREATININE 0.89 0.75 0.70  CALCIUM  9.2 8.8* 9.3  MG  --   --  2.5*   GFR: Estimated Creatinine Clearance: 81.9 mL/min (by C-G formula based on SCr of 0.7 mg/dL). Liver Function Tests: Recent Labs  Lab 03/11/24 1623  AST 25  ALT 20  ALKPHOS 92  BILITOT 0.3  PROT 7.3  ALBUMIN 4.2   Recent Labs  Lab 03/11/24 1623  LIPASE 33   No results for input(s): AMMONIA in the last 168 hours. Coagulation Profile: Recent Labs  Lab 03/11/24 1623  INR 1.0   Cardiac Enzymes: No results for input(s): CKTOTAL, CKMB, CKMBINDEX, TROPONINI in the last 168 hours. BNP (last 3 results) No results for input(s): PROBNP in the last 8760 hours. HbA1C: No results for input(s): HGBA1C in the last 72 hours. CBG: No results for input(s): GLUCAP in the last 168 hours. Lipid Profile: No results for input(s): CHOL, HDL, LDLCALC, TRIG, CHOLHDL, LDLDIRECT in the last 72 hours. Thyroid  Function Tests: No results for input(s): TSH, T4TOTAL, FREET4, T3FREE, THYROIDAB in the last 72 hours. Anemia Panel: No results for input(s): VITAMINB12, FOLATE, FERRITIN, TIBC, IRON , RETICCTPCT in the last 72 hours. Sepsis Labs: Recent Labs  Lab 03/11/24 1623  LATICACIDVEN 0.9    No results found for this or any previous visit (from the past 240 hours).       Radiology Studies: NM GI Blood Loss Result Date: 03/12/2024 CLINICAL DATA:  Hematochezia. EXAM: NUCLEAR MEDICINE GASTROINTESTINAL BLEEDING SCAN TECHNIQUE: Sequential abdominal images were obtained following intravenous administration of Tc-47m labeled red blood cells. RADIOPHARMACEUTICALS:  20.3 mCi Tc-19m pertechnetate in-vitro labeled red cells. COMPARISON:  None Available. FINDINGS: No abnormal accumulation tagged red blood cells in the abdomen pelvis to localize active gastrointestinal bleeding. Physiologic  activity noted in the solid organs and blood pool. IMPRESSION: No evidence of active gastrointestinal bleeding Electronically Signed   By: Jackquline Boxer M.D.   On: 03/12/2024 14:40   CT Angio Abd/Pel W and/or Wo Contrast Result Date: 03/11/2024 CLINICAL DATA:  Nausea, vomiting, diarrhea, severe abdominal pain, blood in both emesis and stool EXAM: CTA ABDOMEN AND PELVIS WITHOUT AND WITH CONTRAST TECHNIQUE: Multidetector CT imaging of the abdomen and pelvis was performed using the standard protocol during bolus administration of intravenous contrast. Multiplanar reconstructed images and MIPs were obtained and reviewed to evaluate the vascular anatomy. RADIATION DOSE REDUCTION: This exam was performed according to the departmental dose-optimization program which includes automated exposure control, adjustment of the mA and/or kV according to patient size and/or use of iterative reconstruction technique. CONTRAST:  OMNIPAQUE  IOHEXOL  350 MG/ML SOLN COMPARISON:  CT abdomen pelvis, 03/09/2024, CT angiogram abdomen pelvis, 01/17/2024 FINDINGS: VASCULAR Normal contour and caliber of the abdominal aorta. No evidence of aneurysm, dissection, or other acute aortic pathology. Standard branching pattern of the abdominal aorta with solitary bilateral renal arteries. Review of the MIP images confirms the above findings.  NON-VASCULAR Lower Chest: No acute findings. Hepatobiliary: No solid liver abnormality is seen. Numerous benign liver cysts, requiring no further follow-up or characterization. Status post cholecystectomy. Unchanged mild postoperative biliary dilatation. Pancreas: Unremarkable. No pancreatic ductal dilatation or surrounding inflammatory changes. Spleen: Normal in size without significant abnormality. Adrenals/Urinary Tract: Adrenal glands are unremarkable. Kidneys are normal, without renal calculi, solid lesion, or hydronephrosis. Bladder is unremarkable. Stomach/Bowel: Stomach is within normal limits.  Appendectomy. No evidence of bowel wall thickening, distention, or inflammatory changes. Sigmoid diverticulosis. Moderate burden of dense stool throughout the colon and rectum. Lymphatic: No enlarged abdominal or pelvic lymph nodes. Reproductive: Status post hysterectomy. Other: No abdominal wall hernia or abnormality. No ascites. Musculoskeletal: No acute osseous findings. IMPRESSION: 1. No intraluminal contrast extravasation or other findings to localize reported GI bleeding. 2. Normal contour and caliber of the abdominal aorta. No evidence of aneurysm, dissection, or other acute aortic pathology. No significant atherosclerosis. 3. Sigmoid diverticulosis without evidence of acute diverticulitis. 4. Moderate burden of dense stool throughout the colon and rectum. 5. Status post cholecystectomy, appendectomy, and hysterectomy. Electronically Signed   By: Marolyn JONETTA Jaksch M.D.   On: 03/11/2024 18:33        Scheduled Meds:  gabapentin   600 mg Oral TID   pantoprazole  (PROTONIX ) IV  40 mg Intravenous Q12H   polyethylene glycol  17 g Oral Daily   sucralfate   1 g Oral QID   Continuous Infusions:  promethazine  (PHENERGAN ) injection (IM or IVPB) 12.5 mg (03/13/24 1427)     LOS: 1 day    Time spent: 55 minutes    Ivis Nicolson D Assyria Morreale, DO [facility]  If 7PM-7AM, please contact night-coverage [email or web address] 03/13/2024, 2:45 PM

## 2024-03-13 NOTE — Plan of Care (Signed)

## 2024-03-13 NOTE — Progress Notes (Addendum)
 Subjective: Abdominal pain had eased off after pain meds earlier but is starting to flare up again. She had an episode of nausea and vomiting yesterday after eating italian ice. Nausea is resolved today. She notes that throughout the course of her experiencing her GI issues, she has had constant lower abdominal pain. She has periumbilical pain that seems to come and go. BM this morning without Melena or BRB. No hematemesis noted yesterday.  Denies any GERD symptoms. She does take carafate  at home which seems to help with the burning pain she sometimes has in her stomach.   Objective: Vital signs in last 24 hours: Temp:  [98 F (36.7 C)-98.2 F (36.8 C)] 98 F (36.7 C) (07/02 0419) Pulse Rate:  [64-88] 67 (07/02 0419) Resp:  [16-19] 19 (07/02 0419) BP: (95-111)/(69-81) 95/69 (07/02 0419) SpO2:  [99 %-100 %] 100 % (07/02 0419) Last BM Date : 03/11/24 General:   Alert and oriented, pleasant Head:  Normocephalic and atraumatic. Eyes:  No icterus, sclera clear. Conjuctiva pink.  Heart:  S1, S2 present, no murmurs noted.  Lungs: Clear to auscultation bilaterally, without wheezing, rales, or rhonchi.  Abdomen:  Bowel sounds present, soft, non-distended. Mild TTP of epigastric region/periumbilical area and mid lower abdomen. No HSM or hernias noted. No rebound or guarding. No masses appreciated  Msk:  Symmetrical without gross deformities. Normal posture. Neurologic:  Alert and  oriented x4;  grossly normal neurologically. Skin:  Warm and dry, intact without significant lesions.  Psych:  Alert and cooperative. Normal mood and affect.  Intake/Output from previous day: 07/01 0701 - 07/02 0700 In: 480 [P.O.:480] Out: -  Intake/Output this shift: No intake/output data recorded.  Lab Results: Recent Labs    03/11/24 1623 03/11/24 2118 03/12/24 0408 03/13/24 0510  WBC 4.4  --  3.3* 2.8*  HGB 11.3* 12.1 10.4* 12.9  HCT 34.8* 37.9 32.0* 41.8  PLT 223  --  208 213   BMET Recent Labs     03/11/24 1623 03/12/24 0408 03/13/24 0418  NA 139 140 141  K 3.8 3.5 3.6  CL 106 108 104  CO2 25 25 24   GLUCOSE 91 87 80  BUN 15 12 8   CREATININE 0.89 0.75 0.70  CALCIUM  9.2 8.8* 9.3   LFT Recent Labs    03/11/24 1623  PROT 7.3  ALBUMIN 4.2  AST 25  ALT 20  ALKPHOS 92  BILITOT 0.3  BILIDIR 0.1  IBILI 0.2*   PT/INR Recent Labs    03/11/24 1623  LABPROT 13.4  INR 1.0    Studies/Results: NM GI Blood Loss Result Date: 03/12/2024 CLINICAL DATA:  Hematochezia. EXAM: NUCLEAR MEDICINE GASTROINTESTINAL BLEEDING SCAN TECHNIQUE: Sequential abdominal images were obtained following intravenous administration of Tc-50m labeled red blood cells. RADIOPHARMACEUTICALS:  20.3 mCi Tc-24m pertechnetate in-vitro labeled red cells. COMPARISON:  None Available. FINDINGS: No abnormal accumulation tagged red blood cells in the abdomen pelvis to localize active gastrointestinal bleeding. Physiologic activity noted in the solid organs and blood pool. IMPRESSION: No evidence of active gastrointestinal bleeding Electronically Signed   By: Jackquline Boxer M.D.   On: 03/12/2024 14:40   CT Angio Abd/Pel W and/or Wo Contrast Result Date: 03/11/2024 CLINICAL DATA:  Nausea, vomiting, diarrhea, severe abdominal pain, blood in both emesis and stool EXAM: CTA ABDOMEN AND PELVIS WITHOUT AND WITH CONTRAST TECHNIQUE: Multidetector CT imaging of the abdomen and pelvis was performed using the standard protocol during bolus administration of intravenous contrast. Multiplanar reconstructed images and MIPs were obtained and reviewed  to evaluate the vascular anatomy. RADIATION DOSE REDUCTION: This exam was performed according to the departmental dose-optimization program which includes automated exposure control, adjustment of the mA and/or kV according to patient size and/or use of iterative reconstruction technique. CONTRAST:  OMNIPAQUE  IOHEXOL  350 MG/ML SOLN COMPARISON:  CT abdomen pelvis, 03/09/2024, CT angiogram  abdomen pelvis, 01/17/2024 FINDINGS: VASCULAR Normal contour and caliber of the abdominal aorta. No evidence of aneurysm, dissection, or other acute aortic pathology. Standard branching pattern of the abdominal aorta with solitary bilateral renal arteries. Review of the MIP images confirms the above findings. NON-VASCULAR Lower Chest: No acute findings. Hepatobiliary: No solid liver abnormality is seen. Numerous benign liver cysts, requiring no further follow-up or characterization. Status post cholecystectomy. Unchanged mild postoperative biliary dilatation. Pancreas: Unremarkable. No pancreatic ductal dilatation or surrounding inflammatory changes. Spleen: Normal in size without significant abnormality. Adrenals/Urinary Tract: Adrenal glands are unremarkable. Kidneys are normal, without renal calculi, solid lesion, or hydronephrosis. Bladder is unremarkable. Stomach/Bowel: Stomach is within normal limits. Appendectomy. No evidence of bowel wall thickening, distention, or inflammatory changes. Sigmoid diverticulosis. Moderate burden of dense stool throughout the colon and rectum. Lymphatic: No enlarged abdominal or pelvic lymph nodes. Reproductive: Status post hysterectomy. Other: No abdominal wall hernia or abnormality. No ascites. Musculoskeletal: No acute osseous findings. IMPRESSION: 1. No intraluminal contrast extravasation or other findings to localize reported GI bleeding. 2. Normal contour and caliber of the abdominal aorta. No evidence of aneurysm, dissection, or other acute aortic pathology. No significant atherosclerosis. 3. Sigmoid diverticulosis without evidence of acute diverticulitis. 4. Moderate burden of dense stool throughout the colon and rectum. 5. Status post cholecystectomy, appendectomy, and hysterectomy. Electronically Signed   By: Marolyn JONETTA Jaksch M.D.   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   Abdominal pain/rectal bleeding/hematemesis:  patient is very well-known to our service as she has presented with multiple hospitalizations and endoscopic evaluations in the past for persistent abdominal pain and intermittent episodes of melena or hematochezia with some episodes of hematemesis.   Had been doing well and then began having periumbilical pain with some vomiting with specks of blood, she endorsed pain as the worse pain she had ever felt. Tried dicyclomine  without improvement, reported compliance with PPI BID. Endorsed 1 episode of rectal bleeding, with blood mixed in with stool.   -Recent admission at [facility] for similar, EGD and CT of abdomen were both unremarkable. - tagged PRBC scan yesterday which was negative. -She was ordered to have am cortisol and porphobiliniogen to rule out other rare causes of her abdominal pain. Her cortisol levels are 3.7. hypocortisolemia could perhaps be contributing to some of her abdominal pain. Would recommend endocrinology consult, this could be done as outpatient if needed.   Patient feeling very frustrated as she has presented with chronic abdominal pain without obvious cause. Concern for possible vasculitis in the past though vessels appear patent on recent imaging. may consider TCA in the future if pain persists as there is likely a component of functional abdominal pain.   She reports nausea is improved today, episode of vomiting yesterday without blood. BM this morning with no blood or black color. She endorses that dilaudid  seems to control her  pain better than anything else she has had.    Plan: Follow for porphyrins labs PPI BID Monitor for overt bleeding Trend  h&h Levsin  PRN Continue supportive measures  Continue carafate  1g QID Recommend endocrinology consult for hypocortisolemia, could be done as outpatient   LOS: 1 day    03/13/2024, 9:29 AM   Karcyn Menn L. Antionne Enrique, MSN, APRN, AGNP-C Adult-Gerontology Nurse Practitioner [facility] at [facility]

## 2024-03-14 DIAGNOSIS — K922 Gastrointestinal hemorrhage, unspecified: Secondary | ICD-10-CM | POA: Diagnosis not present

## 2024-03-14 LAB — COMPREHENSIVE METABOLIC PANEL WITH GFR
ALT: 17 U/L (ref 0–44)
AST: 16 U/L (ref 15–41)
Albumin: 3.4 g/dL — ABNORMAL LOW (ref 3.5–5.0)
Alkaline Phosphatase: 80 U/L (ref 38–126)
Anion gap: 10 (ref 5–15)
BUN: 13 mg/dL (ref 6–20)
CO2: 24 mmol/L (ref 22–32)
Calcium: 8.7 mg/dL — ABNORMAL LOW (ref 8.9–10.3)
Chloride: 103 mmol/L (ref 98–111)
Creatinine, Ser: 0.8 mg/dL (ref 0.44–1.00)
GFR, Estimated: >60 mL/min (ref >60)
Glucose, Bld: 97 mg/dL (ref 70–99)
Potassium: 3.5 mmol/L (ref 3.5–5.1)
Sodium: 137 mmol/L (ref 135–145)
Total Bilirubin: 0.4 mg/dL (ref 0.0–1.2)
Total Protein: 6.1 g/dL — ABNORMAL LOW (ref 6.5–8.1)

## 2024-03-14 LAB — CBC
HCT: 35.6 % — ABNORMAL LOW (ref 36.0–46.0)
Hemoglobin: 11.7 g/dL — ABNORMAL LOW (ref 12.0–15.0)
MCH: 31 pg (ref 26.0–34.0)
MCHC: 32.9 g/dL (ref 30.0–36.0)
MCV: 94.2 fL (ref 80.0–100.0)
Platelets: 201 10*3/uL (ref 150–400)
RBC: 3.78 MIL/uL — ABNORMAL LOW (ref 3.87–5.11)
RDW: 13.9 % (ref 11.5–15.5)
WBC: 3.5 10*3/uL — ABNORMAL LOW (ref 4.0–10.5)
nRBC: 0 % (ref 0.0–0.2)

## 2024-03-14 LAB — MAGNESIUM: Magnesium: 2.3 mg/dL (ref 1.7–2.4)

## 2024-03-14 MED ORDER — PANTOPRAZOLE SODIUM 40 MG PO TBEC: 40.0000 mg | DELAYED_RELEASE_TABLET | Freq: Two times a day (BID) | ORAL | 1 refills | Status: AC

## 2024-03-14 MED ORDER — PROMETHAZINE HCL 12.5 MG PO TABS
12.5000 mg | ORAL_TABLET | Freq: Four times a day (QID) | ORAL | 0 refills | Status: AC | PRN
Start: 2024-03-14 — End: ?

## 2024-03-14 MED ORDER — SUCRALFATE 1 G PO TABS: 1.0000 g | ORAL_TABLET | Freq: Four times a day (QID) | ORAL | 0 refills | Status: AC

## 2024-03-14 MED ORDER — OXYCODONE HCL 5 MG PO TABS: 5.0000 mg | ORAL_TABLET | Freq: Three times a day (TID) | ORAL | 0 refills | Status: AC | PRN

## 2024-03-14 MED ORDER — DICYCLOMINE HCL 20 MG PO TABS: 20.0000 mg | ORAL_TABLET | Freq: Every day | ORAL | 0 refills | Status: AC | PRN

## 2024-03-14 NOTE — Plan of Care (Signed)

## 2024-03-14 NOTE — Progress Notes (Deleted)
 Patient discharged.

## 2024-03-14 NOTE — Discharge Summary (Addendum)
 Physician Discharge Summary  Sabrina [name] FMW:[id number] DOB: [birth year] DOA: 03/11/2024  PCP: Sabrina Hansel Atlas, MD  Admit date: 03/11/2024  Discharge date: 03/14/2024  Admitted From:Home  Disposition:  Home  Recommendations for Outpatient Follow-up:  Follow up with PCP in 1-2 weeks Follow-up with GI Dr. Cinderella with referral sent, follow-up lab work for hepatic porphyria Refills given on Phenergan  as well as Bentyl  as needed for symptomatic management Referral to endocrinology for low cortisol Refills given on PPI as well as Carafate  Continue other home medications as prior  Home Health: None  Equipment/Devices: None  Discharge Condition:Stable  CODE STATUS: Full  Diet recommendation: Heart Healthy  Brief/Interim Summary: 58 y/o female with history of bullous pemphigoid on prior methotrexate/steroids follows with [facility] and was taken off methotrexate around end 09/2023 multinodular goiter, depression/anxiety, chronic knee pain status post meniscal repair 2020, chronic abd pain presented to the ED with complaints of vomiting blood, with blood in stools over the past 2 days with severe abdominal pain.  She was admitted for evaluation of possible GI bleed and had bleeding scan performed 7/1 with no localized GI bleed noted and is overall noted to have stable hemoglobin levels with no overt bleeding noted.  She continued to have some abdominal pain and GI was following and performed cortisol level which was noted to be somewhat low, but not significant enough to be causing her symptoms.  Recommendations are to follow-up with endocrinology outpatient for this.  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  At this time she is able to tolerate diet with minimal pain and no nausea or vomiting.  She is eager for discharge and symptomatic pain medications have been provided.  No other acute events or concerns noted.  Discharge  Diagnoses:  Principal Problem:   GI bleed Active Problems:   Depression   Generalized anxiety disorder with panic attacks   Hematemesis   Abdominal pain   Hematochezia  Principal discharge diagnosis: Chronic lower abdominal pain with concern for hematochezia-negative for GI workup.  Suspect functional causes.  Discharge Instructions  Discharge Instructions     Ambulatory referral to Endocrinology   Complete by: As directed    Ambulatory referral to Gastroenterology   Complete by: As directed    What is the reason for referral?: Other   Diet - low sodium heart healthy   Complete by: As directed    Increase activity slowly   Complete by: As directed       Allergies as of 03/14/2024       Reactions   Droperidol Palpitations, Other (See Comments)   Elevates BP increases HR   Ondansetron  Hives, Swelling, Rash   Spots around IV site IV Zofran    Benzonatate  Rash, Other (See Comments)   Caused burning pain in eyes  Watery eyes  Vision changes  Conjunctivitis   Bromfed Dm [pseudoeph-bromphen-dm] Anxiety   Diclofenac  Sodium Rash   Flexeril [cyclobenzaprine] Anxiety        Medication List     TAKE these medications    acetaminophen  325 MG tablet Commonly known as: TYLENOL  Take 325-650 mg by mouth every 6 (six) hours as needed for mild pain (pain score 1-3).   butalbital -apap-caffeine -codeine  50-325-40-30 MG capsule Commonly known as: FIORICET  WITH CODEINE  Take 1 capsule by mouth daily as needed for headache or migraine.   diazepam  10 MG tablet Commonly known as: VALIUM  Take 1 tablet (10 mg total) by mouth 3 (three) times daily  as needed for anxiety. What changed: when to take this   dicyclomine  20 MG tablet Commonly known as: BENTYL  Take 1 tablet (20 mg total) by mouth daily as needed for up to 10 days for spasms.   DULoxetine  60 MG capsule Commonly known as: CYMBALTA  Take 1 capsule (60 mg total) by mouth daily.   Ferrex 150 150 MG capsule Generic drug:  iron  polysaccharides Take 150 mg by mouth daily.   gabapentin  300 MG capsule Commonly known as: NEURONTIN  Take 2 capsules (600 mg total) by mouth 3 (three) times daily.   naloxone 4 MG/0.1ML Liqd nasal spray kit Commonly known as: NARCAN Place 1 spray into the nose once.   oxyCODONE  5 MG immediate release tablet Commonly known as: Roxicodone  Take 1 tablet (5 mg total) by mouth every 8 (eight) hours as needed for moderate pain (pain score 4-6), severe pain (pain score 7-10) or breakthrough pain.   pantoprazole  40 MG tablet Commonly known as: PROTONIX  Take 1 tablet (40 mg total) by mouth 2 (two) times daily.   promethazine  12.5 MG tablet Commonly known as: PHENERGAN  Take 1 tablet (12.5 mg total) by mouth every 6 (six) hours as needed for nausea or vomiting.   rosuvastatin  20 MG tablet Commonly known as: CRESTOR  Take 1 tablet by mouth daily.   sucralfate  1 g tablet Commonly known as: Carafate  Take 1 tablet (1 g total) by mouth 4 (four) times daily.        Follow-up Information     Sabrina [name], [name], MD. Schedule an appointment as soon as possible for a visit in 1 week(s).   Specialty: Family Medicine Contact information: [address] [place] [address] [place] [address] [phone]         Sabrina [name] FALCON, MD. Go to.   Specialty: Gastroenterology Contact information: [address] [place] [address] [phone]                Allergies  Allergen Reactions   Droperidol Palpitations and Other (See Comments)    Elevates BP increases HR   Ondansetron  Hives, Swelling and Rash    Spots around IV site IV Zofran    Benzonatate  Rash and Other (See Comments)    Caused burning pain in eyes  Watery eyes  Vision changes  Conjunctivitis   Bromfed Dm [Pseudoeph-Bromphen-Dm] Anxiety   Diclofenac  Sodium Rash   Flexeril [Cyclobenzaprine] Anxiety    Consultations: GI   Procedures/Studies: NM GI Blood Loss Result Date: 03/12/2024 CLINICAL  DATA:  Hematochezia. EXAM: NUCLEAR MEDICINE GASTROINTESTINAL BLEEDING SCAN TECHNIQUE: Sequential abdominal images were obtained following intravenous administration of Tc-31m labeled red blood cells. RADIOPHARMACEUTICALS:  20.3 mCi Tc-56m pertechnetate in-vitro labeled red cells. COMPARISON:  None Available. FINDINGS: No abnormal accumulation tagged red blood cells in the abdomen pelvis to localize active gastrointestinal bleeding. Physiologic activity noted in the solid organs and blood pool. IMPRESSION: No evidence of active gastrointestinal bleeding Electronically Signed   By: Jackquline Boxer M.D.   On: 03/12/2024 14:40   CT Angio Abd/Pel W and/or Wo Contrast Result Date: 03/11/2024 CLINICAL DATA:  Nausea, vomiting, diarrhea, severe abdominal pain, blood in both emesis and stool EXAM: CTA ABDOMEN AND PELVIS WITHOUT AND WITH CONTRAST TECHNIQUE: Multidetector CT imaging of the abdomen and pelvis was performed using the standard protocol during bolus administration of intravenous contrast. Multiplanar reconstructed images and MIPs were obtained and reviewed to evaluate the vascular anatomy. RADIATION DOSE REDUCTION: This exam was performed according to the departmental dose-optimization program which  includes automated exposure control, adjustment of the mA and/or kV according to patient size and/or use of iterative reconstruction technique. CONTRAST:  OMNIPAQUE  IOHEXOL  350 MG/ML SOLN COMPARISON:  CT abdomen pelvis, 03/09/2024, CT angiogram abdomen pelvis, 01/17/2024 FINDINGS: VASCULAR Normal contour and caliber of the abdominal aorta. No evidence of aneurysm, dissection, or other acute aortic pathology. Standard branching pattern of the abdominal aorta with solitary bilateral renal arteries. Review of the MIP images confirms the above findings. NON-VASCULAR Lower Chest: No acute findings. Hepatobiliary: No solid liver abnormality is seen. Numerous benign liver cysts, requiring no further follow-up or  characterization. Status post cholecystectomy. Unchanged mild postoperative biliary dilatation. Pancreas: Unremarkable. No pancreatic ductal dilatation or surrounding inflammatory changes. Spleen: Normal in size without significant abnormality. Adrenals/Urinary Tract: Adrenal glands are unremarkable. Kidneys are normal, without renal calculi, solid lesion, or hydronephrosis. Bladder is unremarkable. Stomach/Bowel: Stomach is within normal limits. Appendectomy. No evidence of bowel wall thickening, distention, or inflammatory changes. Sigmoid diverticulosis. Moderate burden of dense stool throughout the colon and rectum. Lymphatic: No enlarged abdominal or pelvic lymph nodes. Reproductive: Status post hysterectomy. Other: No abdominal wall hernia or abnormality. No ascites. Musculoskeletal: No acute osseous findings. IMPRESSION: 1. No intraluminal contrast extravasation or other findings to localize reported GI bleeding. 2. Normal contour and caliber of the abdominal aorta. No evidence of aneurysm, dissection, or other acute aortic pathology. No significant atherosclerosis. 3. Sigmoid diverticulosis without evidence of acute diverticulitis. 4. Moderate burden of dense stool throughout the colon and rectum. 5. Status post cholecystectomy, appendectomy, and hysterectomy. Electronically Signed   By: Marolyn JONETTA Jaksch M.D.   On: 03/11/2024 18:33     Discharge Exam: Vitals:   03/13/24 1918 03/14/24 0617  BP: 104/71 98/69  Pulse: 87 88  Resp: 17 17  Temp: 98.3 F (36.8 C) 97.9 F (36.6 C)  SpO2: 97% 95%   Vitals:   03/13/24 0828 03/13/24 1424 03/13/24 1918 03/14/24 0617  BP: 107/65 112/73 104/71 98/69  Pulse: 68 87 87 88  Resp:  16 17 17   Temp:  (!) 97.4 F (36.3 C) 98.3 F (36.8 C) 97.9 F (36.6 C)  TempSrc:  Oral Oral   SpO2:  100% 97% 95%  Weight:      Height:        General: Pt is alert, awake, not in acute distress Cardiovascular: RRR, S1/S2 +, no rubs, no gallops Respiratory: CTA  bilaterally, no wheezing, no rhonchi Abdominal: Soft, NT, ND, bowel sounds + Extremities: no edema, no cyanosis    The results of significant diagnostics from this hospitalization (including imaging, microbiology, ancillary and laboratory) are listed below for reference.     Microbiology: No results found for this or any previous visit (from the past 240 hours).   Labs: BNP (last 3 results) No results for input(s): BNP in the last 8760 hours. Basic Metabolic Panel: Recent Labs  Lab 03/11/24 1623 03/12/24 0408 03/13/24 0418 03/14/24 0406  NA 139 140 141 137  K 3.8 3.5 3.6 3.5  CL 106 108 104 103  CO2 25 25 24 24   GLUCOSE 91 87 80 97  BUN 15 12 8 13   CREATININE 0.89 0.75 0.70 0.80  CALCIUM  9.2 8.8* 9.3 8.7*  MG  --   --  2.5* 2.3   Liver Function Tests: Recent Labs  Lab 03/11/24 1623 03/14/24 0406  AST 25 16  ALT 20 17  ALKPHOS 92 80  BILITOT 0.3 0.4  PROT 7.3 6.1*  ALBUMIN 4.2 3.4*   Recent  Labs  Lab 03/11/24 1623  LIPASE 33   No results for input(s): AMMONIA in the last 168 hours. CBC: Recent Labs  Lab 03/11/24 1623 03/11/24 2118 03/12/24 0408 03/13/24 0510 03/14/24 0406  WBC 4.4  --  3.3* 2.8* 3.5*  NEUTROABS 2.3  --   --   --   --   HGB 11.3* 12.1 10.4* 12.9 11.7*  HCT 34.8* 37.9 32.0* 41.8 35.6*  MCV 94.3  --  94.4 95.7 94.2  PLT 223  --  208 213 201   Cardiac Enzymes: No results for input(s): CKTOTAL, CKMB, CKMBINDEX, TROPONINI in the last 168 hours. BNP: Invalid input(s): POCBNP CBG: No results for input(s): GLUCAP in the last 168 hours. D-Dimer No results for input(s): DDIMER in the last 72 hours. Hgb A1c No results for input(s): HGBA1C in the last 72 hours. Lipid Profile No results for input(s): CHOL, HDL, LDLCALC, TRIG, CHOLHDL, LDLDIRECT in the last 72 hours. Thyroid  function studies No results for input(s): TSH, T4TOTAL, T3FREE, THYROIDAB in the last 72 hours.  Invalid input(s):  FREET3 Anemia work up No results for input(s): VITAMINB12, FOLATE, FERRITIN, TIBC, IRON , RETICCTPCT in the last 72 hours. Urinalysis    Component Value Date/Time   COLORURINE STRAW (A) 03/11/2024 1900   APPEARANCEUR CLEAR 03/11/2024 1900   LABSPEC 1.009 03/11/2024 1900   PHURINE 7.0 03/11/2024 1900   GLUCOSEU NEGATIVE 03/11/2024 1900   HGBUR NEGATIVE 03/11/2024 1900   BILIRUBINUR NEGATIVE 03/11/2024 1900   KETONESUR NEGATIVE 03/11/2024 1900   PROTEINUR NEGATIVE 03/11/2024 1900   UROBILINOGEN 0.2 07/05/2015 0627   NITRITE NEGATIVE 03/11/2024 1900   LEUKOCYTESUR SMALL (A) 03/11/2024 1900   Sepsis Labs Recent Labs  Lab 03/11/24 1623 03/12/24 0408 03/13/24 0510 03/14/24 0406  WBC 4.4 3.3* 2.8* 3.5*   Microbiology No results found for this or any previous visit (from the past 240 hours).   Time coordinating discharge: 35 minutes  SIGNED:   Adron JONETTA Fairly, DO [facility] 03/14/2024, 10:33 AM  If 7PM-7AM, please contact night-coverage [email or web address]

## 2024-03-16 NOTE — Progress Notes (Signed)
 [facility] Patient Information  Location Information: Patient State (at time of visit): [place]  Patient Location (at time of visit):Home/Other Non-Medical  Provider Location: [place]  Is provider licensed to provide clinical care in the current location/state of the patient? Yes  Consent   Patient's identity was confirmed. Presenting condition or illness was discussed with the patient/personal representative. Current proposed treatment for presenting condition or illness was explained to patient/personal representative along with the likely benefits and any significant risks or complications associated with the provision of treatment by audio/video means. The patient/personal representative verbally authorized treatment to be provided by audio/video, which may include a limited review of patient's current health status, medication, or other treatment recommendations, patient education, and an opportunity to ask questions about condition and treatment. Verbal Consent Granted by Patient/Personal Representative:Yes   Visit Information: Modality: 2-Way Real-Time Audio/Video  Video Start Time: 7:16 AM Video Stop Time: 7:27 AM Video Total Time: 11 minutes  History of Present Illness  Sabrina [name] presents due to concerns for cough. Endorses dry cough, hoarse voice, nasal congestion x 3 days. Recent hospitalization for PUD, did not have endoscopy. Reports low-grade temperature 99.27F. Denies associated chills, chest pain, shortness of breath, wheezing, abdominal pain, nausea, vomiting or dizziness. Similar symptoms 2 weeks ago improved with Phenergan  DM. Currently taking Albuterol  MDI and Flonase. Works in [employer], [employer]; co-worker auscultated her lungs yesterday and said they were clear. Nonsmoker. No recent travel.   Video Exam  Physical Exam Constitutional:      Appearance: Normal appearance.  HENT:     Nose: Congestion present.     Mouth/Throat:      Comments: Mildly hoarse voice Pulmonary:     Effort: Pulmonary effort is normal.     Comments: Active dry cough   Skin:    General: Skin is warm and dry.   Neurological:     General: No focal deficit present.     Mental Status: She is alert.   Psychiatric:        Mood and Affect: Mood normal.     Diagnosis, Medical Decision Making & Disposition  Assessment/ Plan 1. Viral URI - promethazine -dextromethorphan  (PHENERGAN  DM) 6.25-15 mg/5 mL syrp syrup; Take 5 mL by mouth every 6 (six) hours as needed (cough). Do not work or drive while taking. Do not take at the same time as other cough suppressants.  Dispense: 118 mL; Refill: 0  2. Bronchitis (Primary)   58 year old female with cough, congestion s/p recent hospitalization. Differential diagnosis includes but is not limited to viral process such as Covid 19, Influenza, RSV, CAP, etc. Refill cough syrup as it is effective for patient; start Zpack for bronchitis. Patient knows she is welcome to call back at any time for further questions or concerns. She is to seek urgent in-person medical care for worsening symptoms, chest pain, shortness of breath, high fever, persistent vomiting or other concerns.  For questions or concerns regarding this visit, patient should contact Virtual Support at [phone].  Disposition: Visit Completed    Electronically signed: Jade Jye Sung, MD 03/16/2024  8:02 AM

## 2024-03-19 ENCOUNTER — Telehealth (INDEPENDENT_AMBULATORY_CARE_PROVIDER_SITE_OTHER): Payer: Self-pay

## 2024-03-19 NOTE — Telephone Encounter (Signed)
 I spoke with the patient and made her aware that if symptoms worsen please go to the nearest Ed, and we will see her here on Monday 03/25/2024 at 8:30 am to follow up with her concerns. Patient states understanding.

## 2024-03-19 NOTE — Telephone Encounter (Signed)
 Thank you Crystal for advices the patient . Even though I am stated to be her primary gastroenterologist I have seen her only as  inpatient setting  with all other GI MD's and never in the clinic yet .

## 2024-03-19 NOTE — Telephone Encounter (Signed)
 Patient called in today to report she has been having some stabbing/Sharp pain in her periumbilical area and lower abdominal pain. She says she is taking everything that she was prescribed and nothing is helping.   Patient says she is taking Protonix  40 mg bid, Carafate  QID,Bentyl  20 mg bid, tylenol  prn, Was also taking her Oxycodone  every four hours instead of the every eight hours that the script says She was given # 15 on 03/14/2024, and has one of these left. She also has Valium  she uses prn. She is also taking Pepto prn. Patient says she is staying nauseated due to pain, has some phenergan  on hand. Denies any fever, sight of dark stool, bright red blood per rectum, Emesis, Denies any use of NSAIDS. I made patient aware I would notify Dr. Cinderella, but if symptoms worsen she should report to the Ed. Patient states understanding.

## 2024-03-25 ENCOUNTER — Ambulatory Visit (INDEPENDENT_AMBULATORY_CARE_PROVIDER_SITE_OTHER): Admitting: Gastroenterology

## 2024-03-25 ENCOUNTER — Encounter (INDEPENDENT_AMBULATORY_CARE_PROVIDER_SITE_OTHER): Payer: Self-pay

## 2024-05-14 ENCOUNTER — Encounter: Payer: Self-pay | Admitting: "Endocrinology

## 2024-05-14 ENCOUNTER — Ambulatory Visit: Admitting: "Endocrinology

## 2024-06-02 ENCOUNTER — Telehealth: Admitting: Family

## 2024-06-02 DIAGNOSIS — J069 Acute upper respiratory infection, unspecified: Secondary | ICD-10-CM | POA: Diagnosis not present

## 2024-06-02 MED ORDER — PROMETHAZINE-DM 6.25-15 MG/5ML PO SYRP: 5.0000 mL | ORAL_SOLUTION | Freq: Three times a day (TID) | ORAL | 0 refills | Status: DC | PRN

## 2024-06-02 NOTE — Patient Instructions (Signed)

## 2024-06-02 NOTE — Progress Notes (Signed)
 Virtual Visit Consent   CHANAE GEMMA, you are scheduled for a virtual visit with a [facility] provider today. Just as with appointments in the office, your consent must be obtained to participate. Your consent will be active for this visit and any virtual visit you may have with one of our providers in the next 365 days. If you have a MyChart account, a copy of this consent can be sent to you electronically.  As this is a virtual visit, video technology does not allow for your provider to perform a traditional examination. This may limit your provider's ability to fully assess your condition. If your provider identifies any concerns that need to be evaluated in person or the need to arrange testing (such as labs, EKG, etc.), we will make arrangements to do so. Although advances in technology are sophisticated, we cannot ensure that it will always work on either your end or our end. If the connection with a video visit is poor, the visit may have to be switched to a telephone visit. With either a video or telephone visit, we are not always able to ensure that we have a secure connection.  By engaging in this virtual visit, you consent to the provision of healthcare and authorize for your insurance to be billed (if applicable) for the services provided during this visit. Depending on your insurance coverage, you may receive a charge related to this service.  I need to obtain your verbal consent now. Are you willing to proceed with your visit today? Aarin L Kaigler has provided verbal consent on 06/02/2024 for a virtual visit (video or telephone). Bari Learn, FNP  Date: 06/02/2024 12:48 PM   Virtual Visit via Video Note   I, Bari Learn, connected with  MEREDETH FURBER  ([id number], [birth year]) on 06/02/24 at 12:45 PM EDT by a video-enabled telemedicine application and verified that I am speaking with the correct person using two identifiers.  Location: Patient: Virtual Visit Location Patient:  Home Provider: Virtual Visit Location Provider: Home Office   I discussed the limitations of evaluation and management by telemedicine and the availability of in person appointments. The patient expressed understanding and agreed to proceed.    History of Present Illness: Sabrina [name] is a 58 y.o. who identifies as a female who was assigned female at birth, and is being seen today for cough, congestion that started two days.  She did flu and COVID testing that was negative at home.   HPI: Cough This is a new problem. The current episode started in the past 7 days. The problem has been unchanged. The problem occurs every few minutes. The cough is Non-productive. Associated symptoms include a fever, myalgias, nasal congestion and a sore throat. Pertinent negatives include no chills, ear congestion, ear pain, shortness of breath or wheezing. Associated symptoms comments: Low grade fever. She has tried rest and OTC cough suppressant for the symptoms. The treatment provided mild relief.    Problems:  Patient Active Problem List   Diagnosis Date Noted   Hematochezia 03/12/2024   Upper GI hemorrhage 01/17/2024   MDD (major depressive disorder), recurrent episode, moderate (HCC) 12/25/2023   Hypokalemia 12/23/2023   Dyslipidemia 12/22/2023   Obesity, class 1 12/22/2023   Abdominal pain 12/22/2023   Periumbilical abdominal pain 12/20/2023   Acute gastric ulcer with hemorrhage 12/20/2023   Angiectasia of gastrointestinal tract 12/14/2023   Intractable vomiting 12/13/2023   Mixed hyperlipidemia 12/13/2023   GI bleed 10/27/2023   Hematemesis 10/26/2023  Gastritis and gastroduodenitis 10/26/2023   Primary osteoarthritis of left knee 06/16/2020   Effusion, left knee 06/16/2020   Loose body in knee, left knee 06/16/2020   Vitamin D  deficiency 03/24/2020   Hyperlipidemia 03/24/2020   Pain in left foot 01/21/2020   Primary osteoarthritis of right knee 08/06/2019   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    Other meniscus derangements, posterior horn of medial meniscus, left knee 02/19/2019   Bucket handle tear of lateral meniscus 02/19/2019   Unilateral primary osteoarthritis, right knee 12/13/2018   Panic disorder 12/13/2018   Chronic headaches 12/13/2018   Weakness 09/06/2017   Depression 09/06/2017   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   Generalized anxiety disorder with panic attacks 09/06/2017   Right hand pain    Wrist swelling    Cellulitis of right upper extremity 04/11/2016   Elbow pain 07/20/2015   Tachycardia 07/04/2015   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HCC)    Skin ulcer of upper arm, limited to breakdown of skin (HCC) 07/01/2015    Allergies:  Allergies  Allergen Reactions   Droperidol Palpitations and Other (See Comments)    Elevates BP increases HR   Ondansetron  Hives, Swelling and Rash    Spots around IV site IV Zofran    Benzonatate  Rash and Other (See Comments)    Caused burning pain in eyes  Watery eyes  Vision changes  Conjunctivitis   Bromfed Dm [Pseudoeph-Bromphen-Dm] Anxiety   Diclofenac  Sodium Rash   Flexeril [Cyclobenzaprine] Anxiety   Medications:  Current Outpatient Medications:    promethazine -dextromethorphan  (PROMETHAZINE -DM) 6.25-15 MG/5ML syrup, Take 5 mLs by mouth 3 (three) times daily as needed for cough., Disp: 118 mL, Rfl: 0   acetaminophen  (TYLENOL ) 325 MG tablet, Take 325-650 mg by mouth every 6 (six) hours as needed for mild pain (pain score 1-3)., Disp: , Rfl:    butalbital -apap-caffeine -codeine  (FIORICET  WITH CODEINE ) 50-325-40-30 MG capsule, Take 1 capsule by mouth daily as needed for headache or migraine., Disp: , Rfl:    diazepam  (VALIUM ) 10 MG tablet, Take 1 tablet (10 mg total) by mouth 3 (three) times daily as needed for anxiety. (Patient taking differently: Take 10 mg by mouth every 8 (eight) hours as needed for anxiety.), Disp: 90 tablet, Rfl: 5   dicyclomine  (BENTYL ) 20 MG tablet, Take 1 tablet (20 mg total) by mouth daily as needed for up to 10  days for spasms., Disp: 20 tablet, Rfl: 0   DULoxetine  (CYMBALTA ) 60 MG capsule, Take 1 capsule (60 mg total) by mouth daily. (Patient not taking: Reported on 03/11/2024), Disp: 30 capsule, Rfl: 6   FERREX 150 150 MG capsule, Take 150 mg by mouth daily., Disp: , Rfl:    gabapentin  (NEURONTIN ) 300 MG capsule, Take 2 capsules (600 mg total) by mouth 3 (three) times daily., Disp: 540 capsule, Rfl: 1   naloxone (NARCAN) nasal spray 4 mg/0.1 mL, Place 1 spray into the nose once., Disp: , Rfl:    oxyCODONE  (ROXICODONE ) 5 MG immediate release tablet, Take 1 tablet (5 mg total) by mouth every 8 (eight) hours as needed for moderate pain (pain score 4-6), severe pain (pain score 7-10) or breakthrough pain., Disp: 15 tablet, Rfl: 0   pantoprazole  (PROTONIX ) 40 MG tablet, Take 1 tablet (40 mg total) by mouth 2 (two) times daily., Disp: 60 tablet, Rfl: 1   promethazine  (PHENERGAN ) 12.5 MG tablet, Take 1 tablet (12.5 mg total) by mouth every 6 (six) hours as needed for nausea or vomiting., Disp: 30 tablet, Rfl: 0   rosuvastatin  (CRESTOR ) 20  MG tablet, Take 1 tablet by mouth daily., Disp: , Rfl:    sucralfate  (CARAFATE ) 1 g tablet, Take 1 tablet (1 g total) by mouth 4 (four) times daily., Disp: 120 tablet, Rfl: 0  Observations/Objective: Patient is well-developed, well-nourished in no acute distress.  Resting comfortably  at home.  Head is normocephalic, atraumatic.  No labored breathing.  Speech is clear and coherent with logical content.  Patient is alert and oriented at baseline.  Dry nonproductive cough  Assessment and Plan: 1. Viral URI with cough (Primary) - promethazine -dextromethorphan  (PROMETHAZINE -DM) 6.25-15 MG/5ML syrup; Take 5 mLs by mouth 3 (three) times daily as needed for cough.  Dispense: 118 mL; Refill: 0  - Take meds as prescribed - Use a cool mist humidifier  -Use saline nose sprays frequently -Force fluids -For any cough or congestion  Use plain Mucinex - regular strength or max  strength is fine -For fever or aces or pains- take tylenol  or ibuprofen . -Throat lozenges if help -Follow up if symptoms worsen or do not improve   Follow Up Instructions: I discussed the assessment and treatment plan with the patient. The patient was provided an opportunity to ask questions and all were answered. The patient agreed with the plan and demonstrated an understanding of the instructions.  A copy of instructions were sent to the patient via MyChart unless otherwise noted below.     The patient was advised to call back or seek an in-person evaluation if the symptoms worsen or if the condition fails to improve as anticipated.    Bari Learn, FNP

## 2024-06-05 ENCOUNTER — Ambulatory Visit (HOSPITAL_COMMUNITY): Admitting: Psychiatry

## 2024-06-26 ENCOUNTER — Encounter (INDEPENDENT_AMBULATORY_CARE_PROVIDER_SITE_OTHER): Payer: Self-pay | Admitting: Gastroenterology

## 2024-07-30 ENCOUNTER — Ambulatory Visit (HOSPITAL_COMMUNITY): Admitting: Psychiatry

## 2024-08-20 ENCOUNTER — Other Ambulatory Visit: Payer: Self-pay

## 2024-08-20 ENCOUNTER — Ambulatory Visit (HOSPITAL_COMMUNITY): Admitting: Psychiatry

## 2024-08-20 ENCOUNTER — Encounter (HOSPITAL_COMMUNITY): Payer: Self-pay | Admitting: Psychiatry

## 2024-08-20 VITALS — BP 118/82 | HR 88 | Ht 64.0 in | Wt 183.0 lb

## 2024-08-20 DIAGNOSIS — F325 Major depressive disorder, single episode, in full remission: Secondary | ICD-10-CM

## 2024-08-20 MED ORDER — DIAZEPAM 10 MG PO TABS: 10.0000 mg | ORAL_TABLET | Freq: Three times a day (TID) | ORAL | 5 refills | Status: AC | PRN

## 2024-08-20 MED ORDER — DULOXETINE HCL 60 MG PO CPEP: 60.0000 mg | ORAL_CAPSULE | Freq: Every day | ORAL | 6 refills | Status: AC

## 2024-08-20 NOTE — Progress Notes (Signed)
 This is used to review the EEG [facility] MD/PA/NP OP Progress Note  08/20/2024 3:29 PM Sabrina [name]  MRN:  [id number]  Chief Complaint:    Today the patient is seen in the office.  She recently had a right knee replaced.  She is still recovering from it.  She is now starting to go back to work just a little bit.  She is very resilient.  She denies depression or anxiety.  Her GI issues are also much better now.  Her energy level is good.  She is able to think and concentrate.  She drinks no alcohol and uses no drugs.  She has some close friends and she has a daughter and granddaughter who are involved in her life.  They unfortunately live in a different state.  Overall the patient is taking her medicines as prescribed and she is functioning quite well.  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  Location: Patient: home Provider: office   I discussed the limitations, risks, security and privacy concerns of performing an evaluation and management service by telephone and the availability of in person appointments. I also discussed with the patient that there may be a patient responsible charge related to this service. The patient expressed understanding and agreed to proceed.     I discussed the assessment and treatment plan with the patient. The patient was provided an opportunity to ask questions and all were answered. The patient agreed with the plan and demonstrated an understanding of the instructions.   The patient was advised to call back or seek an in-person evaluation if the symptoms worsen or if the condition fails to improve as anticipated.  I provided 30 minutes of non-face-to-face time during this  encounter.   Elna LILLETTE Lo, MD   Virtual Visit via Telephone Note  I connected with Sabrina [name] on 08/20/24 at  3:00 PM EST by telephone and verified that I am speaking with the correct person using two identifiers.  Location: Patient: home Provider: office   I discussed the limitations, risks, security and privacy concerns of performing an evaluation and management service by telephone and the availability of in person appointments. I also discussed with the patient that there may be a patient responsible charge related to this service. The patient expressed understanding and agreed to proceed.     I discussed the assessment and treatment plan with the patient. The patient was provided an opportunity to ask questions and all were answered. The patient agreed with the plan and demonstrated an understanding of the instructions.   The patient was advised to call back or seek an in-person evaluation if the symptoms worsen or if the condition fails to improve as anticipated.  I provided 30 minutes of non-face-to-face time during this encounter.   Elna LILLETTE Lo, MD          Past Medical History:  Past Medical History:  Diagnosis Date   Arthritis    knees (09/06/2017)   Bullous pemphigus (HCC)    Cat bite 06/2014   to left elbow   Hiatal hernia    History of blood transfusion 1988   when I had my baby  Muscle weakness of lower extremity 2001; 09/05/2017   resolved after a couple weeks; ? (09/06/2017)   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HCC)    Poisoning, snake bite 04/08/2016   [place]; RUE   PONV (postoperative nausea and vomiting)    PUD (peptic ulcer disease)    S/P right knee surgery    Situational anxiety    Staph infection ~ 2015   left elbow and finger   Thyroid  mass     Past Surgical History:  Procedure Laterality Date   APPENDECTOMY  ~ 1987   APPLICATION OF A-CELL OF EXTREMITY Left 08/05/2015   Procedure: APPLICATION OF A-CELL OF EXTREMITY;   Surgeon: Estefana GORMAN Fritter, DO;  Location: [facility];  Service: Plastics;  Laterality: Left;   BIOPSY  10/26/2023   Procedure: BIOPSY;  Surgeon: [name], MD;  [facility];  Service: Endoscopy;;   BREAST SURGERY Right 1990   milk duct taken out   CHONDROPLASTY Right 02/19/2019   Procedure: CHONDROPLASTY; EXCISION EXOSTOSIS;  Surgeon: Anderson Maude ORN, MD;  Location: [facility];  Service: Orthopedics;  Laterality: Right;   COLONOSCOPY N/A 12/22/2023   Procedure: COLONOSCOPY;  Surgeon: [name], MD;  [facility];  Service: Endoscopy;  Laterality: N/A;   DEBRIDEMENT AND CLOSURE WOUND Left 07/01/2015   Procedure: LEFT ELBOW EXCISION OF WOUND WITH PRIMARY CLOSURE 2X5 CM ;  Surgeon: Estefana GORMAN Fritter, DO;  Location: [facility];  Service: Plastics;  Laterality: Left;   ELBOW SURGERY Left X 23 in [place]  <06/2015   from a cat bite; all I&D   ESOPHAGOGASTRODUODENOSCOPY N/A 12/14/2023   Procedure: EGD (ESOPHAGOGASTRODUODENOSCOPY);  Surgeon: [name], MD;  [facility];  Service: Endoscopy;  Laterality: N/A;   ESOPHAGOGASTRODUODENOSCOPY N/A 12/19/2023   Procedure: EGD (ESOPHAGOGASTRODUODENOSCOPY);  Surgeon: Eartha Flavors, Toribio, MD;  [facility];  Service: Gastroenterology;  Laterality: N/A;   ESOPHAGOGASTRODUODENOSCOPY N/A 01/18/2024   Procedure: EGD (ESOPHAGOGASTRODUODENOSCOPY);  Surgeon: Cindie Carlin POUR, DO;  [facility];  Service: Endoscopy;  Laterality: N/A;   ESOPHAGOGASTRODUODENOSCOPY (EGD) WITH PROPOFOL  N/A 10/26/2023   Procedure: ESOPHAGOGASTRODUODENOSCOPY (EGD) WITH PROPOFOL ;  Surgeon: [name], MD;  [facility];  Service: Endoscopy;  Laterality: N/A;   GIVENS CAPSULE STUDY N/A 12/22/2023   Procedure: IMAGING PROCEDURE, GI TRACT, INTRALUMINAL, VIA CAPSULE;  Surgeon: [name], MD;  [facility];  Service: Endoscopy;   Laterality: N/A;   HOT HEMOSTASIS  12/14/2023   Procedure: EGD, WITH ARGON PLASMA COAGULATION;  Surgeon: [name], MD;  [facility];  Service: Endoscopy;;   I & D EXTREMITY Left 07/08/2015   Procedure: IRRIGATION AND DEBRIDEMENT EXTREMITY, DRAINAGE OF LEFT ARM WOUND, A-CELL PLACEMENT, WOUND VAC PLACEMENT;  Surgeon: Estefana GORMAN Dillingham, DO;  Location: [facility];  Service: Plastics;  Laterality: Left;   INCISION AND DRAINAGE OF WOUND Left 08/05/2015   Procedure: IRRIGATION AND DEBRIDEMENT LEFT ELBOW WOUND, PLACEMENT OF ACELL;  Surgeon: Estefana GORMAN Fritter, DO;  Location: [facility];  Service: Plastics;  Laterality: Left;   KNEE ARTHROSCOPY WITH MEDIAL MENISECTOMY Right 02/19/2019   Procedure: RIGHT KNEE ARTHROSCOPY, DEBRIDEMENT, PARTIAL MEDIAL AND LATERAL MENISECTOMY;  Surgeon: Anderson Maude ORN, MD;  Location: [facility];  Service: Orthopedics;  Laterality: Right;   LAPAROSCOPIC CHOLECYSTECTOMY  1998   SKIN GRAFT Left 2016   took from anterior thigh; placed at elbow   TONSILLECTOMY  ~ 2000  TOTAL ABDOMINAL HYSTERECTOMY  2003   WRIST SURGERY Right 01/2016    Family Psychiatric History: See intake H&P for full details. Reviewed, with no updates at this time.   Family History:  Family History  Problem Relation Age of Onset   Liver disease Mother    Dementia Mother    Cirrhosis Mother    Prostate cancer Father    Colon cancer Maternal Grandmother    Ovarian cancer Maternal Aunt     Social History:  Social History   Socioeconomic History   Marital status: Divorced    Spouse name: Not on file   Number of children: 1   Years of education: Not on file   Highest education level: Associate degree: academic program  Occupational History   Occupation: unemployed  Tobacco Use   Smoking status: Never   Smokeless tobacco: Never  Vaping Use   Vaping status: Never Used  Substance and Sexual Activity   Alcohol use: Not Currently     Alcohol/week: 0.0 standard drinks of alcohol   Drug use: No   Sexual activity: Not Currently  Other Topics Concern   Not on file  Social History Narrative   Pateint is right-handed. She lives alone in a single level home. She rarely drinks caffeine . She is limited to exercise due to knee injuries.   Social Drivers of Health   Financial Resource Strain: High Risk (06/05/2022)   [facility] prior to [facility]12[facility]2022[facility]   Overall Financial Resource Strain (CARDIA)    Difficulty of Paying Living Expenses: Hard  Food Insecurity: Low Risk (06/18/2024)   Received from [facility]   Hunger Vital Sign    Within the past 12 months, you worried that your food would run out before you got money to buy more: Never true    Within the past 12 months, the food you bought just didn't last and you didn't have money to get more. : Never true  Transportation Needs: No Transportation Needs (06/18/2024)   Received from [facility]    In the past 12 months, has lack of reliable transportation kept you from medical appointments, meetings, work or from getting things needed for daily living? : No  Physical Activity: Inactive (06/05/2022)   Received from [facility] visits prior to 11/12/2022., [facility]   Exercise Vital Sign    On average, how many days per week do you engage in moderate to strenuous exercise (like a brisk walk)?: 0 days    On average, how many minutes do you engage in exercise at this level?: 10 min  Stress: Stress Concern Present (06/05/2022)   Received from [facility] visits prior to 11/12/2022., [facility]   [employer] of Occupational Health - Occupational Stress Questionnaire    Feeling of Stress : Rather much  Social Connections: Moderately Isolated (12/21/2023)   Social Connection and Isolation Panel    Frequency of Communication with Friends and Family: More than  three times a week    Frequency of Social Gatherings with Friends and Family: Once a week    Attends Religious Services: More than 4 times per year    Active Member of [employer] or Organizations: No    Attends Banker Meetings: Never    Marital Status: Divorced    Allergies:  Allergies  Allergen Reactions   Droperidol Palpitations and Other (See Comments)    Elevates BP increases HR  Ondansetron  Hives, Swelling and Rash    Spots around IV site IV Zofran    Benzonatate  Rash and Other (See Comments)    Caused burning pain in eyes  Watery eyes  Vision changes  Conjunctivitis   Bromfed Dm [Pseudoeph-Bromphen-Dm] Anxiety   Diclofenac  Sodium Rash   Flexeril [Cyclobenzaprine] Anxiety    Metabolic Disorder Labs: Lab Results  Component Value Date   HGBA1C 5.4 03/20/2020   MPG 108 03/20/2020   MPG  07/05/2015    QUESTIONABLE IDENTIFICATION / INCORRECTLY LABELED SPECIMEN   No results found for: PROLACTIN Lab Results  Component Value Date   CHOL 267 (H) 03/20/2020   TRIG 76 03/20/2020   HDL 67 03/20/2020   CHOLHDL 4.0 03/20/2020   VLDL 29 04/27/2015   LDLCALC 182 (H) 03/20/2020   LDLCALC 122 (H) 06/18/2018   Lab Results  Component Value Date   TSH 1.52 03/20/2020   TSH 2.890 06/18/2018    Therapeutic Level Labs: No results found for: LITHIUM No results found for: VALPROATE No results found for: CBMZ  Current Medications: Current Outpatient Medications  Medication Sig Dispense Refill   acetaminophen  (TYLENOL ) 325 MG tablet Take 325-650 mg by mouth every 6 (six) hours as needed for mild pain (pain score 1-3).     butalbital -apap-caffeine -codeine  (FIORICET  WITH CODEINE ) 50-325-40-30 MG capsule Take 1 capsule by mouth daily as needed for headache or migraine.     diazepam  (VALIUM ) 10 MG tablet Take 1 tablet (10 mg total) by mouth 3 (three) times daily as needed for anxiety. 90 tablet 5   dicyclomine  (BENTYL ) 20 MG tablet Take 1 tablet (20 mg total) by  mouth daily as needed for up to 10 days for spasms. 20 tablet 0   DULoxetine  (CYMBALTA ) 60 MG capsule Take 1 capsule (60 mg total) by mouth daily. 30 capsule 6   FERREX 150 150 MG capsule Take 150 mg by mouth daily.     gabapentin  (NEURONTIN ) 300 MG capsule Take 2 capsules (600 mg total) by mouth 3 (three) times daily. 540 capsule 1   naloxone (NARCAN) nasal spray 4 mg/0.1 mL Place 1 spray into the nose once.     oxyCODONE  (ROXICODONE ) 5 MG immediate release tablet Take 1 tablet (5 mg total) by mouth every 8 (eight) hours as needed for moderate pain (pain score 4-6), severe pain (pain score 7-10) or breakthrough pain. 15 tablet 0   pantoprazole  (PROTONIX ) 40 MG tablet Take 1 tablet (40 mg total) by mouth 2 (two) times daily. 60 tablet 1   promethazine  (PHENERGAN ) 12.5 MG tablet Take 1 tablet (12.5 mg total) by mouth every 6 (six) hours as needed for nausea or vomiting. 30 tablet 0   promethazine -dextromethorphan  (PROMETHAZINE -DM) 6.25-15 MG/5ML syrup Take 5 mLs by mouth 3 (three) times daily as needed for cough. 118 mL 0   rosuvastatin  (CRESTOR ) 20 MG tablet Take 1 tablet by mouth daily.     sucralfate  (CARAFATE ) 1 g tablet Take 1 tablet (1 g total) by mouth 4 (four) times daily. 120 tablet 0   No current facility-administered medications for this visit.     Musculoskeletal: Strength & Muscle Tone: within normal limits Gait & Station: normal Patient leans: N/A  Psychiatric Specialty Exam: ROS  Blood pressure 118/82, pulse 88, height 5' 4 (1.626 m), weight 183 lb (83 kg).Body mass index is 31.41 kg/m.  General Appearance: Casual and Well Groomed  Eye Contact:  Good  Speech:  Clear and Coherent  Volume:  Normal  Mood:  Euthymic  Affect:  Congruent  Thought Process:  Goal Directed and Descriptions of Associations: Intact  Orientation:  Full (Time, Place, and Person)  Thought Content: Logical   Suicidal Thoughts:  No  Homicidal Thoughts:  No  Memory:  Immediate;   Fair  Judgement:   Fair  Insight:  Fair  Psychomotor Activity:  Normal  Concentration:  Concentration: Good  Recall:  Good  Fund of Knowledge: Good  Language: Good  Akathisia:  Negative  Handed:  Right  AIMS (if indicated): not done  Assets:  Communication Skills Desire for Improvement Housing Transportation  ADL's:  Intact  Cognition: WNL  Sleep:  Fair   Screenings: PHQ2-9    Flowsheet Row Office Visit from 01/24/2020 in [facility] [place] Video Visit from 12/25/2019 in [facility] [place] Office Visit from 09/21/2018 in [facility] Office Visit from 06/25/2018 in [facility] Office Visit from 06/18/2018 in [facility] [place]  PHQ-2 Total Score 2 0 1 0 0  PHQ-9 Total Score 5 2 -- -- --   Flowsheet Row ED to Hosp-Admission (Discharged) from 03/11/2024 in [facility] ED to Hosp-Admission (Discharged) from 01/17/2024 in [facility] ED to Hosp-Admission (Discharged) from 12/21/2023 in [facility]  C-SSRS RISK CATEGORY No Risk No Risk No Risk     Assessment and Plan:    This patient's diagnosis is major clinical depression.  She takes Cymbalta  60 mg and does well.  Presently she is not in any therapy.  She continues taking Valium  10 mg 3 times daily which she has been on for many many years.  She has no evidence of abuse of anything.  She is very consistent with her medicines and does very well.  She will return to see me in 5 months.  Status of current problems: gradually improving  Labs Ordered: No orders of the defined types were placed in this encounter.   Labs Reviewed: n/a  Collateral Obtained/Records Reviewed: n/a  Plan:  Continue Lexapro  20 mg daily Increase Seroquel  to 200 mg nightly Return to clinic in 3-4  months, transfer care to Dr. Tasia Elna LILLETTE Tasia, MD 08/20/2024, 3:29 PM

## 2024-09-15 ENCOUNTER — Telehealth

## 2024-09-15 DIAGNOSIS — J069 Acute upper respiratory infection, unspecified: Secondary | ICD-10-CM

## 2024-09-15 MED ORDER — PROMETHAZINE-DM 6.25-15 MG/5ML PO SYRP: 5.0000 mL | ORAL_SOLUTION | Freq: Three times a day (TID) | ORAL | 0 refills | Status: AC | PRN

## 2024-09-15 NOTE — Patient Instructions (Signed)
 " Sabrina [name], thank you for joining Haze LELON Servant, NP for today's virtual visit.  While this provider is not your primary care provider (PCP), if your PCP is located in our provider database this encounter information will be shared with them immediately following your visit.   A [facility] MyChart account gives you access to today's visit and all your visits, tests, and labs performed at [facility]  click here if you don't have a [facility] MyChart account or go to [email or web address]  Consent: (Patient) Sabrina [name] provided verbal consent for this virtual visit at the beginning of the encounter.  Current Medications:  Current Outpatient Medications:    acetaminophen  (TYLENOL ) 325 MG tablet, Take 325-650 mg by mouth every 6 (six) hours as needed for mild pain (pain score 1-3)., Disp: , Rfl:    butalbital -apap-caffeine -codeine  (FIORICET  WITH CODEINE ) 50-325-40-30 MG capsule, Take 1 capsule by mouth daily as needed for headache or migraine., Disp: , Rfl:    diazepam  (VALIUM ) 10 MG tablet, Take 1 tablet (10 mg total) by mouth 3 (three) times daily as needed for anxiety., Disp: 90 tablet, Rfl: 5   dicyclomine  (BENTYL ) 20 MG tablet, Take 1 tablet (20 mg total) by mouth daily as needed for up to 10 days for spasms., Disp: 20 tablet, Rfl: 0   DULoxetine  (CYMBALTA ) 60 MG capsule, Take 1 capsule (60 mg total) by mouth daily., Disp: 30 capsule, Rfl: 6   FERREX 150 150 MG capsule, Take 150 mg by mouth daily., Disp: , Rfl:    gabapentin  (NEURONTIN ) 300 MG capsule, Take 2 capsules (600 mg total) by mouth 3 (three) times daily., Disp: 540 capsule, Rfl: 1   naloxone (NARCAN) nasal spray 4 mg/0.1 mL, Place 1 spray into the nose once., Disp: , Rfl:    oxyCODONE  (ROXICODONE ) 5 MG immediate release tablet, Take 1 tablet (5 mg total) by mouth every 8 (eight) hours as needed for moderate pain (pain score 4-6), severe pain (pain score 7-10) or breakthrough pain., Disp: 15 tablet, Rfl: 0    pantoprazole  (PROTONIX ) 40 MG tablet, Take 1 tablet (40 mg total) by mouth 2 (two) times daily., Disp: 60 tablet, Rfl: 1   promethazine  (PHENERGAN ) 12.5 MG tablet, Take 1 tablet (12.5 mg total) by mouth every 6 (six) hours as needed for nausea or vomiting., Disp: 30 tablet, Rfl: 0   promethazine -dextromethorphan  (PROMETHAZINE -DM) 6.25-15 MG/5ML syrup, Take 5 mLs by mouth 3 (three) times daily as needed for cough., Disp: 240 mL, Rfl: 0   rosuvastatin  (CRESTOR ) 20 MG tablet, Take 1 tablet by mouth daily., Disp: , Rfl:    sucralfate  (CARAFATE ) 1 g tablet, Take 1 tablet (1 g total) by mouth 4 (four) times daily., Disp: 120 tablet, Rfl: 0   Medications ordered in this encounter:  Meds ordered this encounter  Medications   promethazine -dextromethorphan  (PROMETHAZINE -DM) 6.25-15 MG/5ML syrup    Sig: Take 5 mLs by mouth 3 (three) times daily as needed for cough.    Dispense:  240 mL    Refill:  0    Supervising Provider:   BLAISE ALEENE KIDD [[id number]]     *If you need refills on other medications prior to your next appointment, please contact your pharmacy*  Follow-Up: Call back or seek an in-person evaluation if the symptoms worsen or if the condition fails to improve as anticipated.   Virtual Care [phone]  Other Instructions INSTRUCTIONS: use a humidifier for nasal congestion Drink plenty of fluids, rest and wash hands  frequently to avoid the spread of infection Alternate tylenol  and Motrin  for relief of fever    If you have been instructed to have an in-person evaluation today at a local Urgent Care facility, please use the link below. It will take you to a list of all of our available [facility], including address, phone number and hours of operation. Please do not delay care.  [facility]  If you or a family member do not have a primary care provider, use the link below to schedule a visit and establish care. When you choose a [facility]  primary care physician or advanced practice provider, you gain a long-term partner in health. Find a Primary Care Provider  Learn more about [facility] in-office and virtual care options: [facility] - Get Care Now  "

## 2024-09-15 NOTE — Progress Notes (Signed)
 " Virtual Visit Consent   Sabrina [name], you are scheduled for a virtual visit with a [facility] provider today. Just as with appointments in the office, your consent must be obtained to participate. Your consent will be active for this visit and any virtual visit you may have with one of our providers in the next 365 days. If you have a MyChart account, a copy of this consent can be sent to you electronically.  As this is a virtual visit, video technology does not allow for your provider to perform a traditional examination. This may limit your provider's ability to fully assess your condition. If your provider identifies any concerns that need to be evaluated in person or the need to arrange testing (such as labs, EKG, etc.), we will make arrangements to do so. Although advances in technology are sophisticated, we cannot ensure that it will always work on either your end or our end. If the connection with a video visit is poor, the visit may have to be switched to a telephone visit. With either a video or telephone visit, we are not always able to ensure that we have a secure connection.  By engaging in this virtual visit, you consent to the provision of healthcare and authorize for your insurance to be billed (if applicable) for the services provided during this visit. Depending on your insurance coverage, you may receive a charge related to this service.  I need to obtain your verbal consent now. Are you willing to proceed with your visit today? Sabrina [name] has provided verbal consent on 09/15/2024 for a virtual visit (video or telephone). Sabrina LELON Servant, NP  Date: 09/15/2024 12:43 PM   Virtual Visit via Video Note   I, Sabrina [name], connected with  Sabrina [name]  ([id number], [birth year]) on 09/15/2024 at 12:30 PM EST by a video-enabled telemedicine application and verified that I am speaking with the correct person using two identifiers.  Location: Patient: Virtual Visit Location Patient:  Home Provider: Virtual Visit Location Provider: Home Office   I discussed the limitations of evaluation and management by telemedicine and the availability of in person appointments. The patient expressed understanding and agreed to proceed.    History of Present Illness: Sabrina [name] is a 59 y.o. who identifies as a female who was assigned female at birth, and is being seen today for URi with cough and congestion.   Sabrina [name] has been experiencing flu like symptoms for the past several days. Both COVID and FLU tests have been negative x3. Symptoms include malaise, sore throat, cough, joint pain and myalgias. Cough is keeping her up at night. She has several medications at home including augmentin , steroid nasal spray, magic mouth wash and OTC cough syrups.   Problems:  Patient Active Problem List   Diagnosis Date Noted   Hematochezia 03/12/2024   Upper GI hemorrhage 01/17/2024   MDD (major depressive disorder), recurrent episode, moderate (HCC) 12/25/2023   Hypokalemia 12/23/2023   Dyslipidemia 12/22/2023   Obesity, class 1 12/22/2023   Abdominal pain 12/22/2023   Periumbilical abdominal pain 12/20/2023   Acute gastric ulcer with hemorrhage 12/20/2023   Angiectasia of gastrointestinal tract 12/14/2023   Intractable vomiting 12/13/2023   Mixed hyperlipidemia 12/13/2023   GI bleed 10/27/2023   Hematemesis 10/26/2023   Gastritis and gastroduodenitis 10/26/2023   Primary osteoarthritis of left knee 06/16/2020   Effusion, left knee 06/16/2020   Loose body in knee, left knee 06/16/2020   Vitamin D  deficiency  03/24/2020   Hyperlipidemia 03/24/2020   Pain in left foot 01/21/2020   Primary osteoarthritis of right knee 08/06/2019   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   Other meniscus derangements, posterior horn of medial meniscus, left knee 02/19/2019   Bucket handle tear of lateral meniscus 02/19/2019   Unilateral primary osteoarthritis, right knee 12/13/2018   Panic disorder 12/13/2018    Chronic headaches 12/13/2018   Weakness 09/06/2017   Depression 09/06/2017   Bullous pemphigoid (HCC) 09/06/2017   Generalized anxiety disorder with panic attacks 09/06/2017   Right hand pain    Wrist swelling    Cellulitis of right upper extremity 04/11/2016   Elbow pain 07/20/2015   Tachycardia 07/04/2015   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HCC)    Skin ulcer of upper arm, limited to breakdown of skin 07/01/2015    Allergies: Allergies[1] Medications: Current Medications[2]  Observations/Objective: Patient is well-developed, well-nourished in no acute distress.  Resting comfortably at home.  Head is normocephalic, atraumatic.  No labored breathing.  Speech is clear and coherent with logical content.  Patient is alert and oriented at baseline.    Assessment and Plan: 1. Viral URI with cough - promethazine -dextromethorphan  (PROMETHAZINE -DM) 6.25-15 MG/5ML syrup; Take 5 mLs by mouth 3 (three) times daily as needed for cough.  Dispense: 240 mL; Refill: 0  INSTRUCTIONS: use a humidifier for nasal congestion Drink plenty of fluids, rest and wash hands frequently to avoid the spread of infection Alternate tylenol  and Motrin  for relief of fever   Follow Up Instructions: I discussed the assessment and treatment plan with the patient. The patient was provided an opportunity to ask questions and all were answered. The patient agreed with the plan and demonstrated an understanding of the instructions.  A copy of instructions were sent to the patient via MyChart unless otherwise noted below.     The patient was advised to call back or seek an in-person evaluation if the symptoms worsen or if the condition fails to improve as anticipated.    Sabrina LELON Servant, NP     [1]  Allergies Allergen Reactions   Droperidol Palpitations and Other (See Comments)    Elevates BP increases HR   Ondansetron  Hives, Swelling and Rash    Spots around IV site IV Zofran    Benzonatate  Rash and Other (See  Comments)    Caused burning pain in eyes  Watery eyes  Vision changes  Conjunctivitis   Bromfed Dm [Pseudoeph-Bromphen-Dm] Anxiety   Diclofenac  Sodium Rash   Flexeril [Cyclobenzaprine] Anxiety  [2]  Current Outpatient Medications:    acetaminophen  (TYLENOL ) 325 MG tablet, Take 325-650 mg by mouth every 6 (six) hours as needed for mild pain (pain score 1-3)., Disp: , Rfl:    butalbital -apap-caffeine -codeine  (FIORICET  WITH CODEINE ) 50-325-40-30 MG capsule, Take 1 capsule by mouth daily as needed for headache or migraine., Disp: , Rfl:    diazepam  (VALIUM ) 10 MG tablet, Take 1 tablet (10 mg total) by mouth 3 (three) times daily as needed for anxiety., Disp: 90 tablet, Rfl: 5   dicyclomine  (BENTYL ) 20 MG tablet, Take 1 tablet (20 mg total) by mouth daily as needed for up to 10 days for spasms., Disp: 20 tablet, Rfl: 0   DULoxetine  (CYMBALTA ) 60 MG capsule, Take 1 capsule (60 mg total) by mouth daily., Disp: 30 capsule, Rfl: 6   FERREX 150 150 MG capsule, Take 150 mg by mouth daily., Disp: , Rfl:    gabapentin  (NEURONTIN ) 300 MG capsule, Take 2 capsules (600 mg total) by mouth 3 (  three) times daily., Disp: 540 capsule, Rfl: 1   naloxone (NARCAN) nasal spray 4 mg/0.1 mL, Place 1 spray into the nose once., Disp: , Rfl:    oxyCODONE  (ROXICODONE ) 5 MG immediate release tablet, Take 1 tablet (5 mg total) by mouth every 8 (eight) hours as needed for moderate pain (pain score 4-6), severe pain (pain score 7-10) or breakthrough pain., Disp: 15 tablet, Rfl: 0   pantoprazole  (PROTONIX ) 40 MG tablet, Take 1 tablet (40 mg total) by mouth 2 (two) times daily., Disp: 60 tablet, Rfl: 1   promethazine  (PHENERGAN ) 12.5 MG tablet, Take 1 tablet (12.5 mg total) by mouth every 6 (six) hours as needed for nausea or vomiting., Disp: 30 tablet, Rfl: 0   promethazine -dextromethorphan  (PROMETHAZINE -DM) 6.25-15 MG/5ML syrup, Take 5 mLs by mouth 3 (three) times daily as needed for cough., Disp: 240 mL, Rfl: 0   rosuvastatin   (CRESTOR ) 20 MG tablet, Take 1 tablet by mouth daily., Disp: , Rfl:    sucralfate  (CARAFATE ) 1 g tablet, Take 1 tablet (1 g total) by mouth 4 (four) times daily., Disp: 120 tablet, Rfl: 0  "

## 2024-09-25 ENCOUNTER — Other Ambulatory Visit (HOSPITAL_COMMUNITY): Payer: Self-pay | Admitting: *Deleted

## 2024-09-25 ENCOUNTER — Telehealth (HOSPITAL_COMMUNITY): Payer: Self-pay | Admitting: *Deleted

## 2024-09-25 MED ORDER — DOXEPIN HCL 25 MG PO CAPS: 25.0000 mg | ORAL_CAPSULE | Freq: Every day | ORAL | 4 refills | Status: AC

## 2024-09-25 NOTE — Telephone Encounter (Signed)
 Pt request medication to help reduce insomnia. Pt says the Valium  10 mg TID PRN helps with her anxiety but she is still struggling with sleep issues. Please review and advise.    Last visit: 08/20/24 Next visit: 01/14/25

## 2025-01-14 ENCOUNTER — Ambulatory Visit (HOSPITAL_COMMUNITY): Admitting: Psychiatry
